# Patient Record
Sex: Male | Born: 1955
Health system: Southern US, Community
[De-identification: ages and names within clinical notes are randomized; demographics above are authoritative.]

## PROBLEM LIST (undated history)

## (undated) DIAGNOSIS — Z720 Tobacco use: Secondary | ICD-10-CM

## (undated) DIAGNOSIS — T7840XA Allergy, unspecified, initial encounter: Secondary | ICD-10-CM

## (undated) DIAGNOSIS — J189 Pneumonia, unspecified organism: Secondary | ICD-10-CM

## (undated) DIAGNOSIS — Z8719 Personal history of other diseases of the digestive system: Secondary | ICD-10-CM

## (undated) DIAGNOSIS — I739 Peripheral vascular disease, unspecified: Secondary | ICD-10-CM

## (undated) DIAGNOSIS — E785 Hyperlipidemia, unspecified: Secondary | ICD-10-CM

## (undated) DIAGNOSIS — I10 Essential (primary) hypertension: Secondary | ICD-10-CM

## (undated) DIAGNOSIS — J302 Other seasonal allergic rhinitis: Secondary | ICD-10-CM

## (undated) DIAGNOSIS — I219 Acute myocardial infarction, unspecified: Secondary | ICD-10-CM

## (undated) DIAGNOSIS — J449 Chronic obstructive pulmonary disease, unspecified: Secondary | ICD-10-CM

## (undated) DIAGNOSIS — I209 Angina pectoris, unspecified: Secondary | ICD-10-CM

## (undated) DIAGNOSIS — J439 Emphysema, unspecified: Secondary | ICD-10-CM

## (undated) DIAGNOSIS — I2510 Atherosclerotic heart disease of native coronary artery without angina pectoris: Secondary | ICD-10-CM

## (undated) DIAGNOSIS — R0600 Dyspnea, unspecified: Secondary | ICD-10-CM

## (undated) DIAGNOSIS — R011 Cardiac murmur, unspecified: Secondary | ICD-10-CM

## (undated) DIAGNOSIS — C801 Malignant (primary) neoplasm, unspecified: Secondary | ICD-10-CM

## (undated) DIAGNOSIS — K219 Gastro-esophageal reflux disease without esophagitis: Secondary | ICD-10-CM

## (undated) DIAGNOSIS — Z5189 Encounter for other specified aftercare: Secondary | ICD-10-CM

## (undated) DIAGNOSIS — D649 Anemia, unspecified: Secondary | ICD-10-CM

## (undated) DIAGNOSIS — K649 Unspecified hemorrhoids: Secondary | ICD-10-CM

## (undated) HISTORY — DX: Anemia, unspecified: D64.9

## (undated) HISTORY — PX: ANKLE SURGERY: SHX546

## (undated) HISTORY — DX: Hyperlipidemia, unspecified: E78.5

## (undated) HISTORY — PX: ANTERIOR CRUCIATE LIGAMENT REPAIR: SHX115

## (undated) HISTORY — PX: INGUINAL HERNIA REPAIR: SUR1180

## (undated) HISTORY — DX: Allergy, unspecified, initial encounter: T78.40XA

## (undated) HISTORY — PX: CORONARY ANGIOPLASTY WITH STENT PLACEMENT: SHX49

## (undated) HISTORY — PX: COLONOSCOPY: SHX174

## (undated) HISTORY — PX: UPPER GASTROINTESTINAL ENDOSCOPY: SHX188

## (undated) HISTORY — DX: Emphysema, unspecified: J43.9

## (undated) HISTORY — DX: Gastro-esophageal reflux disease without esophagitis: K21.9

## (undated) HISTORY — DX: Tobacco use: Z72.0

## (undated) HISTORY — PX: MANDIBLE SURGERY: SHX707

## (undated) HISTORY — DX: Peripheral vascular disease, unspecified: I73.9

## (undated) HISTORY — DX: Acute myocardial infarction, unspecified: I21.9

## (undated) HISTORY — PX: KNEE ARTHROSCOPY: SHX127

## (undated) HISTORY — PX: SHOULDER ARTHROSCOPY WITH ROTATOR CUFF REPAIR: SHX5685

## (undated) HISTORY — DX: Essential (primary) hypertension: I10

## (undated) HISTORY — DX: Encounter for other specified aftercare: Z51.89

## (undated) HISTORY — DX: Unspecified hemorrhoids: K64.9

## (undated) HISTORY — PX: FEMORAL ARTERY STENT: SHX1583

## (undated) HISTORY — PX: PERCUTANEOUS STENT INTERVENTION: SHX6019

## (undated) HISTORY — DX: Atherosclerotic heart disease of native coronary artery without angina pectoris: I25.10

## (undated) HISTORY — DX: Personal history of other diseases of the digestive system: Z87.19

---

## 1998-12-28 ENCOUNTER — Ambulatory Visit (HOSPITAL_COMMUNITY): Admission: RE | Admit: 1998-12-28 | Discharge: 1998-12-28 | Payer: Self-pay | Admitting: Orthopedic Surgery

## 1998-12-28 ENCOUNTER — Encounter: Payer: Self-pay | Admitting: Orthopedic Surgery

## 1999-06-25 ENCOUNTER — Ambulatory Visit (HOSPITAL_BASED_OUTPATIENT_CLINIC_OR_DEPARTMENT_OTHER): Admission: RE | Admit: 1999-06-25 | Discharge: 1999-06-25 | Payer: Self-pay | Admitting: Orthopedic Surgery

## 2002-04-30 ENCOUNTER — Ambulatory Visit (HOSPITAL_COMMUNITY): Admission: RE | Admit: 2002-04-30 | Discharge: 2002-04-30 | Payer: Self-pay | Admitting: Internal Medicine

## 2002-04-30 ENCOUNTER — Encounter: Payer: Self-pay | Admitting: Internal Medicine

## 2004-10-18 ENCOUNTER — Ambulatory Visit: Payer: Self-pay | Admitting: Internal Medicine

## 2004-12-07 ENCOUNTER — Ambulatory Visit: Payer: Self-pay | Admitting: Internal Medicine

## 2005-04-09 ENCOUNTER — Ambulatory Visit: Payer: Self-pay | Admitting: Internal Medicine

## 2005-05-02 ENCOUNTER — Ambulatory Visit: Payer: Self-pay | Admitting: Internal Medicine

## 2005-05-23 ENCOUNTER — Ambulatory Visit: Payer: Self-pay | Admitting: Internal Medicine

## 2005-12-26 ENCOUNTER — Ambulatory Visit: Payer: Self-pay | Admitting: Internal Medicine

## 2006-02-24 ENCOUNTER — Inpatient Hospital Stay (HOSPITAL_COMMUNITY): Admission: AD | Admit: 2006-02-24 | Discharge: 2006-02-27 | Payer: Self-pay | Admitting: Internal Medicine

## 2006-02-24 ENCOUNTER — Ambulatory Visit: Payer: Self-pay | Admitting: Internal Medicine

## 2006-02-25 ENCOUNTER — Ambulatory Visit: Payer: Self-pay | Admitting: Internal Medicine

## 2006-02-26 HISTORY — PX: CARDIAC CATHETERIZATION: SHX172

## 2006-04-04 ENCOUNTER — Ambulatory Visit (HOSPITAL_COMMUNITY): Admission: RE | Admit: 2006-04-04 | Discharge: 2006-04-05 | Payer: Self-pay | Admitting: Cardiology

## 2006-04-08 ENCOUNTER — Ambulatory Visit (HOSPITAL_COMMUNITY): Admission: RE | Admit: 2006-04-08 | Discharge: 2006-04-08 | Payer: Self-pay | Admitting: Cardiology

## 2006-11-13 ENCOUNTER — Encounter: Admission: RE | Admit: 2006-11-13 | Discharge: 2006-11-13 | Payer: Self-pay | Admitting: Cardiovascular Disease

## 2006-11-18 ENCOUNTER — Inpatient Hospital Stay (HOSPITAL_COMMUNITY): Admission: AD | Admit: 2006-11-18 | Discharge: 2006-11-19 | Payer: Self-pay | Admitting: Cardiovascular Disease

## 2006-11-18 HISTORY — PX: CARDIAC CATHETERIZATION: SHX172

## 2007-06-22 ENCOUNTER — Encounter: Payer: Self-pay | Admitting: Internal Medicine

## 2007-08-05 ENCOUNTER — Telehealth (INDEPENDENT_AMBULATORY_CARE_PROVIDER_SITE_OTHER): Payer: Self-pay | Admitting: *Deleted

## 2007-10-28 ENCOUNTER — Ambulatory Visit (HOSPITAL_COMMUNITY): Admission: RE | Admit: 2007-10-28 | Discharge: 2007-10-28 | Payer: Self-pay | Admitting: Cardiovascular Disease

## 2007-10-28 HISTORY — PX: CARDIAC CATHETERIZATION: SHX172

## 2007-11-13 ENCOUNTER — Encounter: Payer: Self-pay | Admitting: Internal Medicine

## 2008-02-18 ENCOUNTER — Encounter: Payer: Self-pay | Admitting: Internal Medicine

## 2008-03-03 ENCOUNTER — Encounter: Payer: Self-pay | Admitting: Internal Medicine

## 2008-08-14 ENCOUNTER — Emergency Department (HOSPITAL_COMMUNITY): Admission: EM | Admit: 2008-08-14 | Discharge: 2008-08-15 | Payer: Self-pay | Admitting: Emergency Medicine

## 2008-09-08 ENCOUNTER — Encounter: Payer: Self-pay | Admitting: Internal Medicine

## 2008-09-21 ENCOUNTER — Encounter: Payer: Self-pay | Admitting: Internal Medicine

## 2008-09-21 ENCOUNTER — Encounter: Admission: RE | Admit: 2008-09-21 | Discharge: 2008-09-21 | Payer: Self-pay | Admitting: Cardiovascular Disease

## 2008-09-23 ENCOUNTER — Ambulatory Visit (HOSPITAL_COMMUNITY): Admission: RE | Admit: 2008-09-23 | Discharge: 2008-09-23 | Payer: Self-pay | Admitting: Cardiovascular Disease

## 2008-09-23 HISTORY — PX: CARDIAC CATHETERIZATION: SHX172

## 2008-09-29 ENCOUNTER — Encounter: Payer: Self-pay | Admitting: Internal Medicine

## 2009-03-21 ENCOUNTER — Ambulatory Visit: Payer: Self-pay | Admitting: Internal Medicine

## 2009-03-22 ENCOUNTER — Encounter (INDEPENDENT_AMBULATORY_CARE_PROVIDER_SITE_OTHER): Payer: Self-pay | Admitting: *Deleted

## 2009-05-31 ENCOUNTER — Encounter: Payer: Self-pay | Admitting: Internal Medicine

## 2010-01-05 ENCOUNTER — Encounter: Payer: Self-pay | Admitting: Internal Medicine

## 2010-07-20 ENCOUNTER — Ambulatory Visit: Payer: Self-pay | Admitting: Internal Medicine

## 2010-07-20 DIAGNOSIS — K029 Dental caries, unspecified: Secondary | ICD-10-CM | POA: Insufficient documentation

## 2010-08-30 NOTE — Letter (Signed)
Summary: [facility]  [facility]   Imported By: Lanelle Bal 01/13/2010 11:43:24  _____________________________________________________________________  External Attachment:    Type:   Image     Comment:   External Document

## 2010-08-30 NOTE — Assessment & Plan Note (Signed)
Summary: abscessed tooth requests pain med and abx/kb   Vital Signs:  Patient profile:   55 year old male Height:      68 inches Weight:      138.8 pounds BMI:     21.18 Temp:     98.4 degrees F oral Pulse rate:   64 / minute Resp:     15 per minute BP sitting:   120 / 76  (left arm) Cuff size:   regular  Vitals Entered By: Shonna Chock CMA (July 20, 2010 10:26 AM) CC: Tooth concerns, Neck pain   CC:  Tooth concerns and Neck pain.  History of Present Illness:    He has had a flare of a dental abscess over past 6 days with L jaw & neck pain. This has recurred  intermittently over  several months. He is on Plavix precluding drainage or  resection .Last Oral Surgery assessment  & dental Xrays were  in 2007 by Dr Marliss Coots. his Dentist does not perform  such extractions.  The pain now  is described as dull- aching in neck. It is a "severe, 9 on 10 scale" pain in the jaw; it required he leave work the past 2 days..  The pain is better with NSAIDs.    Current Medications (verified): 1)  Spironolactone 25 Mg  Tabs (Spironolactone) .Marland Kitchen.. 1 By Mouth Two Times A Day 2)  Advicor 500-20 Mg Xr24h-Tab (Niacin-Lovastatin) .Marland Kitchen.. 1 By Mouth Once Daily 3)  Diovan 160 Mg Tabs (Valsartan) .Marland Kitchen.. 1 By Mouth Once Daily 4)  Plavix 75 Mg Tabs (Clopidogrel Bisulfate) .Marland Kitchen.. 1 By Mouth Once Daily 5)  Toprol Xl 100 Mg Xr24h-Tab (Metoprolol Succinate) .Marland Kitchen.. 1 By Mouth Once Daily 6)  Bayer Low Strength 81 Mg Tbec (Aspirin) .Marland Kitchen.. 1 By Mouth Once Daily 7)  Nexium 40 Mg Pack (Esomeprazole Magnesium) .Marland Kitchen.. 1 By Mouth Once Daily 8)  Norvasc 5 Mg Tabs (Amlodipine Besylate) .Marland Kitchen.. 1 By Mouth Once Daily 9)  Claritin 10 Mg Tabs (Loratadine) .Marland Kitchen.. 1 By Mouth Once Daily  Allergies: 1)  ! Tylox  Review of Systems General:  Denies chills, fever, and sweats.  Physical Exam  General:  in no acute distress; alert,appropriate and cooperative throughout examination Ears:  External ear exam shows no significant lesions or  deformities.  Otoscopic examination reveals  wax bilaterally Nose:  External nasal examination shows no deformity or inflammation. Nasal mucosa are pink and moist without lesions or exudates. Mouth:  Oral mucosa and oropharynx without lesions or exudates.  Teeth in  horrible state of carious decay, especially molars Heart:  Normal rate and regular rhythm. S1 and S2 normal without gallop, murmur,  rub . Loud S4 vs click @ apex Cervical Nodes:  No lymphadenopathy noted Axillary Nodes:  No palpable lymphadenopathy   Impression & Recommendations:  Problem # 1:  DENTAL CARIES (ICD-521.00) SEVERE ; very high risk of Endocarditis DISCUSSED. On chronic Plavix therapy.  Complete Medication List: 1)  Spironolactone 25 Mg Tabs (Spironolactone) .Marland Kitchen.. 1 by mouth two times a day 2)  Advicor 500-20 Mg Xr24h-tab (Niacin-lovastatin) .Marland Kitchen.. 1 by mouth once daily 3)  Diovan 160 Mg Tabs (Valsartan) .Marland Kitchen.. 1 by mouth once daily 4)  Plavix 75 Mg Tabs (Clopidogrel bisulfate) .Marland Kitchen.. 1 by mouth once daily 5)  Toprol Xl 100 Mg Xr24h-tab (Metoprolol succinate) .Marland Kitchen.. 1 by mouth once daily 6)  Bayer Low Strength 81 Mg Tbec (Aspirin) .Marland Kitchen.. 1 by mouth once daily 7)  Nexium 40 Mg Pack (Esomeprazole magnesium) .Marland KitchenMarland KitchenMarland Kitchen  1 by mouth once daily 8)  Norvasc 5 Mg Tabs (Amlodipine besylate) .Marland Kitchen.. 1 by mouth once daily 9)  Claritin 10 Mg Tabs (Loratadine) .Marland Kitchen.. 1 by mouth once daily 10)  Amoxicillin-pot Clavulanate 1000-62.5 Mg Xr12h-tab (Amoxicillin-pot clavulanate) .Marland Kitchen.. 1 every 12 hrs with a meal 11)  Tramadol Hcl 50 Mg Tabs (Tramadol hcl) .Marland Kitchen.. 1 every 6 hrs as needed for pain  Patient Instructions: 1)  Discuss addressing the very high risk of the very severe , advanced  diffuse caries with Dr Allyson Sabal &  an Oral Surgeon. The principle is antibiotics PLUS I&D for optimal response. Prescriptions: TRAMADOL HCL 50 MG TABS (TRAMADOL HCL) 1 every 6 hrs as needed for pain  #30 x 0   Entered and Authorized by:   Marga Melnick MD   Signed by:    Marga Melnick MD on 07/20/2010   Method used:   Print then Give to Patient   RxID:   [id number] AMOXICILLIN-POT CLAVULANATE 1000-62.5 MG XR12H-TAB (AMOXICILLIN-POT CLAVULANATE) 1 every 12 hrs with a meal  #14 x 0   Entered and Authorized by:   Marga Melnick MD   Signed by:   Marga Melnick MD on 07/20/2010   Method used:   Print then Give to Patient   RxID:   [id number]    Orders Added: 1)  Est. Patient Level III [32440]

## 2010-12-11 NOTE — Cardiovascular Report (Signed)
NAMECAI, ANFINSON NO.:  [id number]   MEDICAL RECORD NO.:  [id number]          PATIENT TYPE:  OIB   LOCATION:  2855                         FACILITY:  [facility]   PHYSICIAN:  Nanetta Batty, M.D.   DATE OF BIRTH:  [birth year]   DATE OF PROCEDURE:  10/28/2007  DATE OF DISCHARGE:                            CARDIAC CATHETERIZATION   Sean Rose is a 55 year old thin-appearing Caucasian male with history  of CAD status post stenting of his dominant right by Dr. Clarene Duke February 26, 2006 with multiple overlapping Liberte bare-metal stents.  He had an  anomalous circumflex and otherwise no significant CAD with normal LV  function.  Does have PVOD status post right common iliac artery PTA and  stenting by Dr. Jacinto Halim approximately a month later.  His other problems  include continued tobacco abuse, treated hypertension, treated  dyslipidemia.  He has been experiencing increasing chest pain similar to  his previous PCI symptoms over the last several weeks.  These are  associated with some shortness of breath and nausea.  He presents now  for outpatient diagnostic coronary angiography to define his anatomy and  rule out ischemic etiology.  His last catheterization performed by  myself November 18, 2006 was remarkable for diffuse in-stent restenosis  treated with cutting balloon atherectomy.   DESCRIPTION OF PROCEDURE:  The patient was brought to the second floor  [facility] cardiac catheterization lab in the postabsorptive state.  He  was premedicated with p.o. Valium.   His right groin was prepped and draped in the usual sterile fashion.  One percent Xylocaine was used for local anesthesia.  A 6-French sheath  was inserted into the right femoral artery using standard Seldinger  technique.  A 6-French right and left Judkins diagnostic catheter as  well as a 6-French pigtail catheter and no torque catheter were used for  selective coronary angiography, left ventriculography, and  distal  abdominal aortography.  Visipaque dye was used for the entirety of the  case.  __________ aorta , ventricular and pull-back pressures were  recorded.   HEMODYNAMIC RESULTS:  Aortic systolic pressure 114, diastolic pressure  67.   Ventricle systolic pressure 160, end-diastolic pressure 9.   SELECTIVE CORONARY ANGIOGRAPHY:  1. Left main; given the fact that there was an anomalous circumflex,      there was no left main.  2. LAD; widely patent.  The LAD gave off several small to medium sized      diagonal branches.  There was a 30% stenosis in the midportion of      the LAD.  3. The left circumflex; small nondominant and an anomalous coming off      the ostium of the RCA.  There is a 40% proximal stenosis.  4. Right coronary; large dominant vessel stented from the first bend      to the genu of the vessel.  The stented segment was widely patent.      There is a 30% proximal stenosis on the first bend prior to the      beginning of the stent.  There is  a 50% smooth tubular stenosis      just distal to the end of the stent which has not changed since the      previous angiogram.  5. Left ventriculography; RAO left ventriculogram was performed using      25 mL of Visipaque dye at 12 mL per second.  The overall LVEF was      estimated at greater than 60% without focal wall motion      abnormalities.  6. Distal abdominal aortography; distal abdominal aortogram was      performed using 20 mL of Visipaque dye at 20 mL per second.  The      infrarenal abdominal aorta was free of significant atherosclerotic      changes.  The right common iliac artery stent was widely patent.      There was approximately 50% ostial left common iliac artery      stenosis.   IMPRESSION:  Sean Rose' anatomy is unchanged.  The atherectomized  portions performed one year ago are widely patent.  It is certainly  possible that the distal RCA past the stented segment may be  physiologically significant,  but it only appears to be about 50% smooth  and tubular.  We will continue to treat him medically.   Sheath was removed, and pressure was held on the groin to achieve  hemostasis.  The patient left the lab in stable condition.  He will be  discharged home later today as an outpatient and will him back in the  office in one to two weeks for follow-up.      Nanetta Batty, M.D.  Electronically Signed     JB/MEDQ  D:  10/28/2007  T:  10/28/2007  Job:  [id number]   cc:   [facility]  Titus Dubin. Alwyn Ren, MD,FACP,FCCP

## 2010-12-11 NOTE — Cardiovascular Report (Signed)
Sean, Rose NO.:  [id number]   MEDICAL RECORD NO.:  [id number]          PATIENT TYPE:  OIB   LOCATION:  2899                         FACILITY:  [facility]   PHYSICIAN:  Nanetta Batty, M.D.   DATE OF BIRTH:  Apr 27, 1956   DATE OF PROCEDURE:  DATE OF DISCHARGE:  09/23/2008                            CARDIAC CATHETERIZATION   Sean Rose is a 55 year old thin-appearing married white male father of two  who had RCA stenting on February 02, 2008 with overlapping Liberte bare-metal  stent by Dr. Clarene Duke.  He had significant right iliac disease which was  stented by Dr. Jacinto Halim who had cathed him November 04, 2007 revealing  significant in-stent restenosis of his RCA and I performed cutting  balloon atherectomy.  He did have an anomalous circumflex which was  small and had a 40% proximal lesion.  He had re-cathed him a year later  revealing a 50% distal RCA stenosis as well as a 50% left common iliac  artery stenosis.  He had negative nuclear study in July 2009 but is  still complaining of daily chest pain.  Because of this, he presents now  for elective outpatient diagnostic coronary arteriography to define his  anatomy and rule out ischemic etiology.   DESCRIPTION OF PROCEDURE:  The patient was brought to the Second Floor  [facility] in the postabsorptive state.  He was  premedicated with p.o. Valium.  His right groin was prepped and shaved  in the usual sterile fashion.  Xylocaine 1% was used for local  anesthesia.  A 6-French sheath was inserted into the right femoral  artery using standard Seldinger technique.  A 6-French left and right  Judkins diagnostic catheter as well as a 6-French pigtail catheter and 5-  [place] Mentor catheter were used for selective coronary angiography, and  left ventriculography respectively.  Visipaque dye was used for the  entirety of the case.  Retrograde aortic, left ventricular and pullback  pressures were recorded.   HEMODYNAMICS:  1. Aortic systolic pressure 118, diastolic pressure 70.  2. Left ventricular systolic pressure 120, end-diastolic pressure 9.   SELECTIVE CORONARY ANGIOGRAPHY:  1. Left main normal.  2. LAD; LAD was a small to moderate size vessel that had mostly 30%      stenosis in the midportion.  3. Right coronary artery; is a large dominant vessel with long stented      segment from proximal down to the genu.  There was at most 30% in-      stent restenosis with this portion of the stent.  There was fairly      a focal 50% stenosis just beyond the stent which had not changed      from his prior cath.  4. Circumflex; anomalous and arising from the origin of the right      coronary artery with at most 30-40% stenosis from the origin in      proximal portion.  5. Left ventriculography; RAO left ventriculogram was performed using      a 25 mL of Visipaque dye at 12 mL per  second.  The overall LVEF was      estimated at greater than 60% without focal wall motion      abnormalities.   IMPRESSION:  [place] anatomy is unchanged from his last cath.  I am not  convinced of his distal right coronary artery lesion is hemodynamically  significant and certainly do not think it was responsible for his  symptoms.  Continued medical therapy will be recommended.  He may need a  GI evaluation in addition.   Sheath was removed and pressure was held in groin to achieve hemostasis.  The patient left the lab in stable condition.      Nanetta Batty, M.D.  Electronically Signed     JB/MEDQ  D:  09/23/2008  T:  09/23/2008  Job:  [id number]   cc:   Second Floor [facility]  [facility]  Titus Dubin. Alwyn Ren, MD,FACP,FCCP

## 2010-12-14 NOTE — Op Note (Signed)
[facility]  Patient:    Sean Rose                        MRN: [id number] Proc. Date: 06/25/99 Adm. Date:  [id number] Attending:  Twana First                           Operative Report  PREOPERATIVE DIAGNOSIS: 1. Right shoulder partial rotator cuff tear. 2. Right shoulder partial labrum tear. 3. Right shoulder impingement. 4. Right shoulder distal clavicle, degenerative joint disease, acromioclavicular    joint degenerative joint disease and spurring.  POSTOPERATIVE DIAGNOSIS: 1. Right shoulder partial rotator cuff tear. 2. Right shoulder partial labrum tear. 3. Right shoulder impingement. 4. Right shoulder distal clavicle, degenerative joint disease, acromioclavicular    joint degenerative joint disease and spurring.  OPERATION PERFORMED: 1. Right shoulder examination under anesthesia followed by arthroscopic partial  rotator cuff tear debridement, partial labrum tear debridement. 2. Right shoulder arthroscopic subacromial decompression. 3. Right shoulder arthroscopically assisted distal clavicle excision.  SURGEON:  Elana Alm. Thurston Hole, M.D.  ASSISTANT:  Kirstin Adelberger, P.A.  ANESTHESIA:  General.  OPERATIVE TIME:  45 minutes.  COMPLICATIONS:  None.  INDICATIONS FOR PROCEDURE:  Sean Rose is a 55 year old gentleman who has had significant right shoulder pain on and off for the past year increasing in nature with signs and symptoms consistent with a partial rotator cuff tear, impingement and AC joint spurring and has failed conservative care and is now to undergo arthroscopy.  DESCRIPTION OF PROCEDURE:  Sean Rose was brought to the operating room on June 25, 1999 after a supraclavicular block had been placed in the holding room.  He was placed on the operating table in supine position.  After an adequate level of general anesthesia was obtained, his right shoulder was examined under  anesthesia.  He had full  range of motion and the shoulder was stable to ligamentous exam.  After this was done, he was placed in a beach chair position and his shoulder and arm were prepped using sterile Betadine and draped using sterile technique.  Originally, through a posterior arthroscopic portal, the arthroscope with a pump attached was placed and through an anterior portal, an arthroscopic  probe was placed.  On initial inspection, the articular cartilage and the glenohumeral joint was found to be intact.  Anterior labrum showed a partial tear of 25% which was debrided.  The superior labrum and biceps tendon anchor showed  moderate instability, partial tearing and this was amenable to thermal shrinkage stabilization and this was performed with a 90 degree Arthrex wand.  The biceps  tendon itself was intact.  The inferior labrum and anterior inferior glenohumeral ligaments were intact.  Subscapularis was intact.  The posterior labrum showed moderate fraying which was debrided.  The inferior capsular recess was free of pathology.  The rotator cuff showed a partial tear 50% of the undersurface of the supraspinatus which was debrided.  The rest of the rotator cuff was found to be  intact.  The subacromial space was entered and a lateral arthroscopic portal was made.  Moderately thickened bursitis was resected.  Underneath this the rotator  cuff was found to be thickened and edematous but no evidence of a tear. Subacromial decompression was carried out removing 6 to 8 mm of the undersurface of the anterior, anterolateral and anteromedial acromion.  CA ligament release carried out at well.  The distal clavicle AC joint was exposed.  Found to have significant spurring and degenerative changes in this joint.  A 6 mm bur was used to resect the distal 5 to 6 mm of the clavicle and the spurs as well.  Intraoperative fluoroscopy confirmed adequate decompression and excision of the distal clavicle.   After this was done, the shoulder could be brought through a full range of motion with no impingement.  At this point it was felt that all pathology had been satisfactorily addressed.  The instruments were removed.  Portals were closed with 3-0 nylon suture and injected with 0.25% Marcaine with epinephrine.  Sterile dressings applied and a sling and then the patient awakened and taken to the recovery in stable condition.  FOLLOW-UP:  Mr. Sean Rose will be followed as an outpatient on [place] NX for pain.  See him back in the office in a week for suture removal and follow-up.  Begin early physical therapy for passive range of motion only x 3 to 4 weeks and then active thereafter. DD:  06/25/99 TD:  06/25/99 Job: [id number]

## 2011-01-31 HISTORY — PX: OTHER SURGICAL HISTORY: SHX169

## 2011-04-23 LAB — CBC
HCT: 42.6
Platelets: 374
RDW: 13.8

## 2011-04-23 LAB — BASIC METABOLIC PANEL
BUN: 2 — ABNORMAL LOW
Calcium: 9
Chloride: 99
Creatinine, Ser: 0.66

## 2011-08-09 HISTORY — PX: OTHER SURGICAL HISTORY: SHX169

## 2011-08-09 HISTORY — PX: US CAROTID DOPPLER BILATERAL (ARMC HX): HXRAD1402

## 2012-11-11 ENCOUNTER — Other Ambulatory Visit (HOSPITAL_COMMUNITY): Payer: Self-pay | Admitting: Cardiovascular Disease

## 2012-11-11 DIAGNOSIS — I25810 Atherosclerosis of coronary artery bypass graft(s) without angina pectoris: Secondary | ICD-10-CM

## 2012-11-11 DIAGNOSIS — I739 Peripheral vascular disease, unspecified: Secondary | ICD-10-CM

## 2012-11-17 ENCOUNTER — Ambulatory Visit (HOSPITAL_COMMUNITY)
Admission: RE | Admit: 2012-11-17 | Discharge: 2012-11-17 | Disposition: A | Discharge status: Home / Self Care | Payer: BC Managed Care – PPO | Source: Ambulatory Visit | Attending: Cardiovascular Disease | Admitting: Cardiovascular Disease

## 2012-11-17 DIAGNOSIS — I25810 Atherosclerosis of coronary artery bypass graft(s) without angina pectoris: Secondary | ICD-10-CM | POA: Insufficient documentation

## 2012-11-17 DIAGNOSIS — F17200 Nicotine dependence, unspecified, uncomplicated: Secondary | ICD-10-CM | POA: Insufficient documentation

## 2012-11-17 DIAGNOSIS — Z8249 Family history of ischemic heart disease and other diseases of the circulatory system: Secondary | ICD-10-CM | POA: Insufficient documentation

## 2012-11-17 DIAGNOSIS — I10 Essential (primary) hypertension: Secondary | ICD-10-CM | POA: Insufficient documentation

## 2012-11-17 HISTORY — PX: CARDIOVASCULAR STRESS TEST: SHX262

## 2012-11-17 MED ORDER — AMINOPHYLLINE 25 MG/ML IV SOLN
75.0000 mg | Freq: Once | INTRAVENOUS | Status: AC
  Administered 2012-11-17: 75 mg via INTRAVENOUS

## 2012-11-17 MED ORDER — TECHNETIUM TC 99M SESTAMIBI GENERIC - CARDIOLITE
10.5000 | Freq: Once | INTRAVENOUS | Status: AC | PRN
  Administered 2012-11-17: 11 via INTRAVENOUS

## 2012-11-17 MED ORDER — REGADENOSON 0.4 MG/5ML IV SOLN
0.4000 mg | Freq: Once | INTRAVENOUS | Status: AC
  Administered 2012-11-17: 0.4 mg via INTRAVENOUS

## 2012-11-17 MED ORDER — TECHNETIUM TC 99M SESTAMIBI GENERIC - CARDIOLITE
31.6000 | Freq: Once | INTRAVENOUS | Status: AC | PRN
  Administered 2012-11-17: 32 via INTRAVENOUS

## 2012-11-17 NOTE — Procedures (Addendum)
[facility] [address] [place] [address] [place] [address] [phone]  Cardiology Nuclear Med Study  Sean Rose is a 57 y.o. male     MRN : [id number]     DOB: 01-Sep-1955  Procedure Date: 11/17/2012  Nuclear Med Background Indication for Stress Test:  Stent Patency History:  CAD;STENT X3/PTCA--2008 Cardiac Risk Factors: Family History - CAD, Hypertension, Lipids and Smoker  Symptoms:  DOE, Fatigue, Light-Headedness, Palpitations and SOB   Nuclear Pre-Procedure Caffeine/Decaff Intake:  8:00pm NPO After: 6:00am   IV Site: R Antecubital  IV 0.9% NS with Angio Cath:  22g  Chest Size (in):  42" IV Started by: Emmit Pomfret, RN  Height: 5\' 9"  (1.753 m)  Cup Size: n/a  BMI:  Body mass index is 20.67 kg/(m^2). Weight:  140 lb (63.504 kg)   Tech Comments:  N/A    Nuclear Med Study 1 or 2 day study: 1 day  Stress Test Type:  Lexiscan  Order Authorizing Provider:  Nanetta Batty, MD   Resting Radionuclide: Technetium 74m Sestamibi  Resting Radionuclide Dose: 10.5 mCi   Stress Radionuclide:  Technetium 82m Sestamibi  Stress Radionuclide Dose: 31.6 mCi           Stress Protocol Rest HR: 63 Stress HR: 79  Rest BP: 129/76 Stress BP: 143/83  Exercise Time (min): n/a METS: n/a          Dose of Adenosine (mg):  n/a Dose of Lexiscan: 0.4 mg  Dose of Atropine (mg): n/a Dose of Dobutamine: n/a mcg/kg/min (at max HR)  Stress Test Technologist: Ernestene Mention, CCT Nuclear Technologist: Gonzella Lex, CNMT   Rest Procedure:  Myocardial perfusion imaging was performed at rest 45 minutes following the intravenous administration of Technetium 7m Sestamibi. Stress Procedure:  The patient received IV Lexiscan 0.4 mg over 15-seconds.  Technetium 66m Sestamibi injected at 30-seconds.  Due to patient's shortness of breath, light-headedness and fatigue, he was given IV Aminophylline 75 mg. Symptoms were resolved during recovery. There were no  significant changes with Lexiscan.  Quantitative spect images were obtained after a 45 minute delay.  Transient Ischemic Dilatation (Normal <1.22):  1.09 Lung/Heart Ratio (Normal <0.45):  0.26 QGS EDV:  99 ml QGS ESV:  40 ml LV Ejection Fraction: 60%  Signed by Gonzella Lex, CNMT  PHYSICIAN INTERPRETATION:  Rest ECG: NSR with non-specific ST-T wave changes  Stress ECG: No significant change from baseline ECG  QPS Raw Data Images:  There is a large loop of bowel & left hepatic lobe with significant tracer intensity that obscures the entier inferior LV border, and reduces the accuracy of interpretation. Stress Images:  There is decreased uptake in the septum.  There is a medium sized, moderate to intensity, mostly fixed perfusion defect in the basal to mid septal wall extending inferiorly towards the apex.  There is also decreased inferior wall uptake that can be attributed by subdiaphragmatic attenuation. Rest Images:  There is decreased uptake in the septum.   Subtraction (SDS):  There is a fixed defect that is most consistent with a previous infarction involving the basal (and likely infero-basal) septum.  However, the absence of notable wall motion abnormalities in this region along with only mildly reduced thickening would suggest potential artifact/ attenuation related defect. There is no notable reversibility to suggest ischemia.   Impression Exercise Capacity:  Lexiscan with no exercise. BP Response:  Normal blood pressure response. Clinical Symptoms:  There is dyspnea. ECG Impression:  No  significant ECG changes with Lexiscan. Comparison with Prior Nuclear Study: The septal perfusion defect is new when compared to the study from 2010.    LV Wall Motion:  Normal LV Function with a "focal" mid inferior wall perfusion imaging abnormality with hypokinesis is noted.  Overall Impression:  Abnormal Myocardial Perfusion Imaging with a basal to mid perfusion defect suggestive of previous  MI. Low risk stress nuclear study in the absence of "ischmia" findings.  Clinical Correlation is warranted.    Marykay Lex, MD  11/17/2012 1:35 PM

## 2012-11-26 ENCOUNTER — Ambulatory Visit (HOSPITAL_COMMUNITY)
Admission: RE | Admit: 2012-11-26 | Discharge: 2012-11-26 | Disposition: A | Discharge status: Home / Self Care | Payer: BC Managed Care – PPO | Source: Ambulatory Visit | Attending: Cardiovascular Disease | Admitting: Cardiovascular Disease

## 2012-11-26 DIAGNOSIS — I2510 Atherosclerotic heart disease of native coronary artery without angina pectoris: Secondary | ICD-10-CM | POA: Insufficient documentation

## 2012-11-26 DIAGNOSIS — I739 Peripheral vascular disease, unspecified: Secondary | ICD-10-CM | POA: Insufficient documentation

## 2012-11-26 DIAGNOSIS — I25810 Atherosclerosis of coronary artery bypass graft(s) without angina pectoris: Secondary | ICD-10-CM

## 2012-11-26 HISTORY — PX: TRANSTHORACIC ECHOCARDIOGRAM: SHX275

## 2012-11-26 NOTE — Progress Notes (Signed)
[facility]   2D echo completed 11/26/2012.   Cindy Caspar Favila, RDCS  

## 2012-11-26 NOTE — Progress Notes (Signed)
Arterial Duplex Lower Ext. Completed. Sean Rose D  

## 2012-12-08 ENCOUNTER — Telehealth: Payer: Self-pay | Admitting: Cardiovascular Disease

## 2012-12-08 NOTE — Telephone Encounter (Signed)
Returning [place] call about his test results .Marland [phone]

## 2012-12-09 ENCOUNTER — Telehealth: Payer: Self-pay | Admitting: Cardiovascular Disease

## 2012-12-09 NOTE — Telephone Encounter (Signed)
i spoke with patient and gave echo results

## 2012-12-09 NOTE — Telephone Encounter (Signed)
lmom 

## 2012-12-09 NOTE — Telephone Encounter (Signed)
I spoke with patient and gave him his echocardiogram results...normal

## 2012-12-09 NOTE — Telephone Encounter (Signed)
Returning your call. °

## 2012-12-18 ENCOUNTER — Other Ambulatory Visit: Payer: Self-pay | Admitting: Cardiovascular Disease

## 2012-12-18 LAB — CBC
MCH: 34.2 pg — ABNORMAL HIGH (ref 26.0–34.0)
MCV: 95.8 fL (ref 78.0–100.0)
Platelets: 328 10*3/uL (ref 150–400)
RDW: 13.5 % (ref 11.5–15.5)
WBC: 14.7 10*3/uL — ABNORMAL HIGH (ref 4.0–10.5)

## 2012-12-18 LAB — HEPATIC FUNCTION PANEL
AST: 22 U/L (ref 0–37)
Albumin: 4.1 g/dL (ref 3.5–5.2)
Alkaline Phosphatase: 56 U/L (ref 39–117)
Total Protein: 7.1 g/dL (ref 6.0–8.3)

## 2012-12-18 LAB — BASIC METABOLIC PANEL
Calcium: 9.6 mg/dL (ref 8.4–10.5)
Chloride: 95 mEq/L — ABNORMAL LOW (ref 96–112)
Creat: 0.68 mg/dL (ref 0.50–1.35)

## 2012-12-18 LAB — LIPID PANEL
HDL: 43 mg/dL (ref >39)
Triglycerides: 224 mg/dL — ABNORMAL HIGH (ref <150)

## 2012-12-22 ENCOUNTER — Telehealth: Payer: Self-pay | Admitting: Cardiovascular Disease

## 2012-12-22 ENCOUNTER — Telehealth: Payer: Self-pay | Admitting: Pediatrics

## 2012-12-22 NOTE — Telephone Encounter (Signed)
Returned call.  Pt stated he had blood work done Friday. Stated he noticed a "goose egg" on his arm after he left.  Stated the bruising goes from elbow to mid-forearm.  C/o some swelling and soreness at site.  Denied warmness to site.  Advised pt continue to monitor area for the next 24hrs, apply cool compress 15-53mins a few times/day prn.  If swelling, pain, numbness, tingling below site call back ASAP.  Pt informed it is not abnormal to have bruising after bloodwork, especially w/ Plavix.  Pt verbalized understanding and agreed w/ plan.

## 2012-12-22 NOTE — Telephone Encounter (Signed)
Had lab work Brock Bad is bruised-very concerned!

## 2012-12-23 ENCOUNTER — Other Ambulatory Visit (INDEPENDENT_AMBULATORY_CARE_PROVIDER_SITE_OTHER): Payer: BC Managed Care – PPO

## 2012-12-23 ENCOUNTER — Telehealth: Payer: Self-pay | Admitting: *Deleted

## 2012-12-23 DIAGNOSIS — D72829 Elevated white blood cell count, unspecified: Secondary | ICD-10-CM

## 2012-12-23 NOTE — Telephone Encounter (Signed)
12/23/2012 12:55 PM Pecola Lawless, MD      Comment: To adequately assess this; the CBC would need to be repeated WITH differential. After that is completed he should make an appointment to discuss the relevance of any elevation of the white count. Dx: elevated WBC      Discuss with patient, appt scheduled labs ordered.

## 2012-12-24 ENCOUNTER — Ambulatory Visit (INDEPENDENT_AMBULATORY_CARE_PROVIDER_SITE_OTHER): Payer: BC Managed Care – PPO | Admitting: Internal Medicine

## 2012-12-24 ENCOUNTER — Encounter: Payer: Self-pay | Admitting: Internal Medicine

## 2012-12-24 VITALS — BP 124/82 | HR 60 | Wt 140.2 lb

## 2012-12-24 DIAGNOSIS — D72829 Elevated white blood cell count, unspecified: Secondary | ICD-10-CM

## 2012-12-24 DIAGNOSIS — F17200 Nicotine dependence, unspecified, uncomplicated: Secondary | ICD-10-CM

## 2012-12-24 DIAGNOSIS — R5383 Other fatigue: Secondary | ICD-10-CM

## 2012-12-24 DIAGNOSIS — R5381 Other malaise: Secondary | ICD-10-CM

## 2012-12-24 DIAGNOSIS — R61 Generalized hyperhidrosis: Secondary | ICD-10-CM

## 2012-12-24 LAB — CBC WITH DIFFERENTIAL/PLATELET
Basophils Relative: 0.3 % (ref 0.0–3.0)
Eosinophils Absolute: 0.3 10*3/uL (ref 0.0–0.7)
MCHC: 35.3 g/dL (ref 30.0–36.0)
MCV: 99.1 fl (ref 78.0–100.0)
Monocytes Absolute: 0.8 10*3/uL (ref 0.1–1.0)
Neutrophils Relative %: 47.7 % (ref 43.0–77.0)
Platelets: 337 10*3/uL (ref 150.0–400.0)

## 2012-12-24 NOTE — Progress Notes (Signed)
  Subjective:    Patient ID: Sean Rose, male    DOB: Oct 15, 1955, 57 y.o.   MRN: [id number]  HPI  He describes fatigue for approximately 7 years, ever since he had his first catheterization and stents was placed on multiple cardiac medicines.  His cardiologist performed an extensive labs; CBC and differential revealed a white blood count of 14,700 which was of concern to him.  Repeat CBC and differential reveals a normal white count and differential. His red cell count was minimally reduced at 4,210,000 /uL  His mother had colon cancer. He's had 2 colonoscopies; he may have had a small polyp initially.    Review of Systems   He has had some night sweats. He denies fever, chills, abdominal pain, unexplained weight loss, melena, or rectal bleeding.  He denies significant cough or sputum production. He's had no hemoptysis.     Objective:   Physical Exam  General appearance:thin but adequately nourished; no acute distress or increased work of breathing is present.  No  lymphadenopathy about the head, neck, or axilla noted.   Eyes: No conjunctival inflammation or lid edema is present. There is no scleral icterus.  Ears:  External ear exam shows no significant lesions or deformities.  Otoscopic examination reveals wax bilaterally  Nose:  External nasal examination shows no deformity or inflammation. Nasal mucosa are slightly boggy on L without lesions or exudates. No septal dislocation or deviation.No obstruction to airflow.   Oral exam: Dental hygiene is good; lips and gums are healthy appearing.There is no oropharyngeal erythema or exudate noted. Lower partial  Neck:  No deformities, thyromegaly, masses, or tenderness noted.   Supple with full range of motion without pain.   Heart:  Normal rate and regular rhythm. Split S1 ; S2 normal without gallop, murmur, click, rub or other extra sounds.   Lungs:Chest clear to auscultation; no wheezes, rhonchi,rales ,or rubs present.No  increased work of breathing.  Decreased BS; barrel chested  Extremities:  No cyanosis or  Edema. Slight  clubbing  noted    Skin: Warm & dry w/o jaundice or tenting.         Assessment & Plan:  #1 leukocytosis, resolved  #2 fatigue; no explanation lab test. This may represent an effect of his polypharmacy, especially the beta blocker  #3 night sweats  #4 smoker  Plan: CXR indicated

## 2012-12-24 NOTE — Patient Instructions (Addendum)
Order for x-rays entered into  the computer; these will be performed at [address] across from [facility]. No appointment is necessary. Please verify when follow up colonoscopy is due based on your records.As per the Standard of Care , screening Colonoscopy recommended @ 50 & every 5-10 years thereafter . More frequent monitor would be dictated by family history or findings @ Colonoscopy.  If you activate the  My Chart system; lab & Xray results will be released directly  to you as soon as I review & address these through the computer. If you choose not to sign up for My Chart within 36 hours of labs being drawn; results will be reviewed & interpretation added before being copied & mailed, causing a delay in getting the results to you.If you do not receive that report within 7-10 days ,please call. Additionally you can use this system to gain direct  access to your records  if  out of town or @ an office of a  physician who is not in  the My Chart network.  This improves continuity of care & places you in control of your medical record.

## 2013-01-20 ENCOUNTER — Encounter: Payer: Self-pay | Admitting: Internal Medicine

## 2013-01-20 ENCOUNTER — Encounter: Payer: Self-pay | Admitting: Physician Assistant

## 2013-01-20 ENCOUNTER — Ambulatory Visit (INDEPENDENT_AMBULATORY_CARE_PROVIDER_SITE_OTHER): Payer: BC Managed Care – PPO | Admitting: Physician Assistant

## 2013-01-20 VITALS — BP 122/68 | HR 72 | Ht 60.25 in | Wt 141.2 lb

## 2013-01-20 DIAGNOSIS — I2510 Atherosclerotic heart disease of native coronary artery without angina pectoris: Secondary | ICD-10-CM

## 2013-01-20 DIAGNOSIS — Z1211 Encounter for screening for malignant neoplasm of colon: Secondary | ICD-10-CM

## 2013-01-20 DIAGNOSIS — K222 Esophageal obstruction: Secondary | ICD-10-CM | POA: Insufficient documentation

## 2013-01-20 DIAGNOSIS — I739 Peripheral vascular disease, unspecified: Secondary | ICD-10-CM

## 2013-01-20 DIAGNOSIS — Z86010 Personal history of colonic polyps: Secondary | ICD-10-CM

## 2013-01-20 DIAGNOSIS — Z800 Family history of malignant neoplasm of digestive organs: Secondary | ICD-10-CM

## 2013-01-20 DIAGNOSIS — K219 Gastro-esophageal reflux disease without esophagitis: Secondary | ICD-10-CM | POA: Insufficient documentation

## 2013-01-20 DIAGNOSIS — Z7901 Long term (current) use of anticoagulants: Secondary | ICD-10-CM

## 2013-01-20 DIAGNOSIS — D689 Coagulation defect, unspecified: Secondary | ICD-10-CM

## 2013-01-20 MED ORDER — MOVIPREP 100 G PO SOLR: 1.0000 | Freq: Once | ORAL | Status: DC

## 2013-01-20 NOTE — Patient Instructions (Addendum)
You have been scheduled for a colonoscopy with propofol. Please follow written instructions given to you at your visit today.  Please pick up your prep kit at the pharmacy within the next 1-3 days. If you use inhalers (even only as needed), please bring them with you on the day of your procedure. Your physician has requested that you go to www.startemmi.com and enter the access code given to you at your visit today. This web site gives a general overview about your procedure. However, you should still follow specific instructions given to you by our office regarding your preparation for the procedure.  We sent a prescription for the colonoscopy prep to [facility], [place].

## 2013-01-20 NOTE — Progress Notes (Signed)
Agree with Ms. Esterwood's assessment and plan. Nitza Schmid E. Damyia Strider, MD, FACG   

## 2013-01-20 NOTE — Progress Notes (Signed)
Subjective:    Patient ID: Sean Rose, male    DOB: 12/26/1955, 57 y.o.   MRN: [id number]  HPI  Sean Rose is a 57 year old white male known to Dr. Leone Payor from prior colonoscopy and EGD both done in 2003. Patient comes in today to discuss followup colonoscopy. He is maintained on chronic Plavix and aspirin to 2 history of coronary artery disease and peripheral vascular disease. He is managed by Dr. Nanetta Batty and had several stents the last of which was placed for 5 years ago. Patient has family history of colon cancer in his mother diagnosed in her 67s. Patient currently has no active GI symptoms he says he has had long-term issues with intermittent diarrhea and very occasionally sees a small amount of bright red blood on the tissue which she says has occurred for many years. He does have chronic GERD, but has no complaints of heartburn indigestion or dysphagia currently. Colonoscopy was done in November of 2003 with finding of internal and hemorrhoids and 2 polyps the larger  was 7 mm. These were both hyperplastic. EGD showed gastritis, hiatal hernia and a distal stricture which was Maloney dilated .    Review of Systems  Constitutional: Negative.   HENT: Negative.   Eyes: Negative.   Respiratory: Negative.   Gastrointestinal: Negative.   Endocrine: Negative.   Genitourinary: Negative.   Musculoskeletal: Negative.   Skin: Negative.   Allergic/Immunologic: Negative.   Neurological: Negative.   Hematological: Negative.   Psychiatric/Behavioral: Negative.    Outpatient Prescriptions Prior to Visit  Medication Sig Dispense Refill  . amLODipine (NORVASC) 5 MG tablet Take 5 mg by mouth daily.      Marland Kitchen aspirin EC 81 MG tablet Take 81 mg by mouth daily.      . clopidogrel (PLAVIX) 75 MG tablet Take 75 mg by mouth daily.      Marland Kitchen esomeprazole (NEXIUM) 40 MG capsule Take 40 mg by mouth daily before breakfast.      . Loratadine (CLARITIN PO) Take by mouth daily.      . metoprolol succinate  (TOPROL-XL) 100 MG 24 hr tablet Take 100 mg by mouth daily. Take with or immediately following a meal.      . Niacin-Lovastatin (ADVICOR PO) Take 500 mg by mouth daily.      Marland Kitchen spironolactone (ALDACTONE) 25 MG tablet Take 25 mg by mouth. 2 by mouth daily      . valsartan (DIOVAN) 160 MG tablet Take 160 mg by mouth daily.       No facility-administered medications prior to visit.   Allergies  Allergen Reactions  . Oxycodone-Acetaminophen     REACTION: mood swings, no rash or fever   Patient Active Problem List   Diagnosis Date Noted  . Personal history of colonic polyps 01/20/2013  . Chronic anticoagulation 01/20/2013  . PVD (peripheral vascular disease) 01/20/2013  . Coronary atherosclerosis of native coronary artery 01/20/2013  . GERD (gastroesophageal reflux disease) 01/20/2013  . Benign esophageal stricture 01/20/2013  . DENTAL CARIES 07/20/2010   History  Substance Use Topics  . Smoking status: Current Every Day Smoker -- 1.00 packs/day    Types: Cigarettes  . Smokeless tobacco: Not on file     Comment: smoked age 57-present , up to 2 ppd. 12/24/12 < 1 ppd  . Alcohol Use: Yes     Comment:  14 drinks/ week   family history includes Colon cancer in his mother and Heart disease in his father and paternal grandfather.  Objective:   Physical Exam  well-developed white male in no acute distress. Blood pressure 122/68 pulse 72 height 5 foot weight 141. HEENT; nontraumatic normocephalic EOMI PERRLA, Neck; supple no JVD, Cardiovascular; regular rate and rhythm with S1-S2 no murmur or gallop, Pulmonary; clear bilaterally, Abdomen ;soft nontender nondistended bowel sounds are active there is no palpable mass or hepatosplenomegaly, Rectal; exam not done, Extremities ;no clubbing cyanosis or edema skin warm and dry, Psych; mood and affect normal and appropriate.        Assessment & Plan:  #31 57 year old white male with personal history of hyperplastic polyps, last colonoscopy 2003  and interval development of family history of colon cancer in patient's mother #2 chronic GERD #3 chronic antiplatelet therapy-patient on Plavix and aspirin #4 coronary artery disease status post coronary stenting #5 peripheral vascular disease status post stenting  Plan; patient is scheduled for colonoscopy with Dr. Benjamine Mola was discussed in detail with patient he is agreeable to proceed. We discussed the need for him to have followup colonoscopies on a q. 5 year interval due to family history. Patient will need to come off of Plavix for 5 days prior to his procedure and we'll obtain consent from his cardiologist Dr. Nanetta Batty.

## 2013-01-25 ENCOUNTER — Other Ambulatory Visit: Payer: Self-pay | Admitting: *Deleted

## 2013-01-25 ENCOUNTER — Telehealth: Payer: Self-pay | Admitting: *Deleted

## 2013-01-25 MED ORDER — MOVIPREP 100 G PO SOLR: 1.0000 | Freq: Once | ORAL | Status: DC

## 2013-01-25 NOTE — Telephone Encounter (Signed)
I called and spoke to pt.  I asked if Dr. Hazle Coca office contacted him about the Plavix and the colonoscopy clearance we need.  He said no but he stopped the Plavix on Sat 01-23-2013 on his own.  I told him I would contact them today and let them know and get clearance.  He asked me to remove the Spironalactone medication off his med list. He is not taking it.He said he didn't get his Moviprep at pharmacy, they said we didn't send script.  I refaxed it today to [facility], [address], [place]. I told him I would call him back once I for formal clearance for the colonoscopy/Plavix.  I told him to stay off the plavix though and stay on the aspirin.

## 2013-01-25 NOTE — Telephone Encounter (Signed)
Nada Boozer NP reviewed chart and gave clearance to hold Plavix prior to procedure and proceed with colonoscopy

## 2013-01-26 ENCOUNTER — Telehealth: Payer: Self-pay | Admitting: Gastroenterology

## 2013-01-26 ENCOUNTER — Other Ambulatory Visit: Payer: Self-pay | Admitting: Gastroenterology

## 2013-01-26 NOTE — Telephone Encounter (Signed)
The patient had a problem with his pharmacy.  Sean Rose spoke to the patient and told him she would call the pharmacy and be sure they have the prep.  He was upset and she assured him she would take care of it.  Also the patient told me the pharmacy was [facility].  I had asked him if it was the [facility] at the [facility] on [address].  Evidently it was another [facility]. The patient called Sean Rose back and advised I had sent the  Prescription to the wrong pharmacy.  Also I gave Sean Rose a printed note from Sean Rose office and Nada Boozer NP said the patient can hold the plavix  Until the day after the procedure on 01-28-2013.  The patient stopped the Plavix on his own on Sat 01-23-2013.

## 2013-01-26 NOTE — Telephone Encounter (Signed)
Pt called in upset because pharmacy is telling him they do not have his prep which was faxed over twice.  I told him I will call the pharmacy and make sure they will have it for him. [facility] called [facility] talked to [place] the pharmacist gave her Rx for moviprep, along with coupon #'s so pt will save $10. Called pt back, told him who I spoke with, and for him to call me back if prep is too expensive or there are any other issues. Pt was much calmer and thanked me and said he will call me if he runs into any problem

## 2013-01-26 NOTE — Telephone Encounter (Signed)
Pt called back sayingg he gave [place] the wrong pharmacy from the beginning. [facility] [address], even though when I spoke to him earlier he confirmed that was the correct one. I told him I will call the one at [phone] and speak to gene the pharamacist and give her the Rx info. Once I did I called the pt back and told him they are filling it and to call me back with any problems pt verbalized understanding

## 2013-01-28 ENCOUNTER — Encounter: Payer: Self-pay | Admitting: Internal Medicine

## 2013-01-28 ENCOUNTER — Ambulatory Visit (AMBULATORY_SURGERY_CENTER): Payer: BC Managed Care – PPO | Admitting: Internal Medicine

## 2013-01-28 VITALS — BP 120/75 | HR 63 | Temp 98.2°F | Resp 17 | Ht 60.0 in | Wt 141.0 lb

## 2013-01-28 DIAGNOSIS — Z1211 Encounter for screening for malignant neoplasm of colon: Secondary | ICD-10-CM

## 2013-01-28 MED ORDER — SODIUM CHLORIDE 0.9 % IV SOLN: 500.0000 mL | INTRAVENOUS | Status: DC

## 2013-01-28 NOTE — Patient Instructions (Addendum)
You have hemorrhoids but no other abnormalities were seen.   Next routine colonoscopy 10 years - 2024.  Please restart Plavix today.  I appreciate the opportunity to care for you. Iva Boop, MD, [facility] [place]  Discharge instructions given with verbal understanding. Handout on hemorrhoids given. Resume previous medications. YOU HAD AN ENDOSCOPIC PROCEDURE TODAY AT THE [facility]: Refer to the procedure report that was given to you for any specific questions about what was found during the examination.  If the procedure report does not answer your questions, please call your gastroenterologist to clarify.  If you requested that your care partner not be given the details of your procedure findings, then the procedure report has been included in a sealed envelope for you to review at your convenience later.  YOU SHOULD EXPECT: Some feelings of bloating in the abdomen. Passage of more gas than usual.  Walking can help get rid of the air that was put into your GI tract during the procedure and reduce the bloating. If you had a lower endoscopy (such as a colonoscopy or flexible sigmoidoscopy) you may notice spotting of blood in your stool or on the toilet paper. If you underwent a bowel prep for your procedure, then you may not have a normal bowel movement for a few days.  DIET: Your first meal following the procedure should be a light meal and then it is ok to progress to your normal diet.  A half-sandwich or bowl of soup is an example of a good first meal.  Heavy or fried foods are harder to digest and may make you feel nauseous or bloated.  Likewise meals heavy in dairy and vegetables can cause extra gas to form and this can also increase the bloating.  Drink plenty of fluids but you should avoid alcoholic beverages for 24 hours.  ACTIVITY: Your care partner should take you home directly after the procedure.  You should plan to take it easy, moving slowly for the rest of the day.  You can  resume normal activity the day after the procedure however you should NOT DRIVE or use heavy machinery for 24 hours (because of the sedation medicines used during the test).    SYMPTOMS TO REPORT IMMEDIATELY: A gastroenterologist can be reached at any hour.  During normal business hours, 8:30 AM to 5:00 PM Monday through Friday, call [phone].  After hours and on weekends, please call the GI answering service at [phone] who will take a message and have the physician on call contact you.   Following lower endoscopy (colonoscopy or flexible sigmoidoscopy):  Excessive amounts of blood in the stool  Significant tenderness or worsening of abdominal pains  Swelling of the abdomen that is new, acute  Fever of 100F or higher  FOLLOW UP: If any biopsies were taken you will be contacted by phone or by letter within the next 1-3 weeks.  Call your gastroenterologist if you have not heard about the biopsies in 3 weeks.  Our staff will call the home number listed on your records the next business day following your procedure to check on you and address any questions or concerns that you may have at that time regarding the information given to you following your procedure. This is a courtesy call and so if there is no answer at the home number and we have not heard from you through the emergency physician on call, we will assume that you have returned to your regular daily activities without  incident.  SIGNATURES/CONFIDENTIALITY: You and/or your care partner have signed paperwork which will be entered into your electronic medical record.  These signatures attest to the fact that that the information above on your After Visit Summary has been reviewed and is understood.  Full responsibility of the confidentiality of this discharge information lies with you and/or your care-partner.

## 2013-01-28 NOTE — Progress Notes (Signed)
Procedure ends, to recovery, report given and VSS. 

## 2013-01-28 NOTE — Progress Notes (Signed)
Patient did not experience any of the following events: a burn prior to discharge; a fall within the facility; wrong site/side/patient/procedure/implant event; or a hospital transfer or hospital admission upon discharge from the facility. (G8907) Patient did not have preoperative order for IV antibiotic SSI prophylaxis. (G8918)  

## 2013-01-28 NOTE — Op Note (Signed)
[facility] [address] [place], [address]   COLONOSCOPY PROCEDURE REPORT  PATIENT: Harman, Langhans  MR#: [id number] BIRTHDATE: [birth year] , 57  yrs. old GENDER: Male ENDOSCOPIST: Iva Boop, MD, [facility] REFERRED BY: PROCEDURE DATE:  01/28/2013 PROCEDURE:   Colonoscopy, screening ASA CLASS:   Class II INDICATIONS:average risk screening and Last colonoscopy performed 10 years ago. (2 polyps but NOT pre-cancerous) MEDICATIONS: Propofol (Diprivan) 160 mg IV, MAC sedation, administered by CRNA, and These medications were titrated to patient response per physician's verbal order  DESCRIPTION OF PROCEDURE:   After the risks benefits and alternatives of the procedure were thoroughly explained, informed consent was obtained.  A digital rectal exam revealed no abnormalities of the rectum and A digital rectal exam revealed the prostate was not enlarged.   The [id number]  endoscope was introduced through the anus and advanced to the cecum, which was identified by both the appendix and ileocecal valve. No adverse events experienced.   The quality of the prep was excellent using Suprep  The instrument was then slowly withdrawn as the colon was fully examined.      COLON FINDINGS: A normal appearing cecum, ileocecal valve, and appendiceal orifice were identified.  The ascending, hepatic flexure, transverse, splenic flexure, descending, sigmoid colon and rectum appeared unremarkable.  No polyps or cancers were seen.   A right colon retroflexion was performed.  Retroflexed views revealed internal/external hemorrhoids. The time to cecum=1 minutes 43 seconds.  Withdrawal time=6 minutes 29 seconds.  The scope was withdrawn and the procedure completed. COMPLICATIONS: There were no complications.  ENDOSCOPIC IMPRESSION: 1.   Internal hemorrhoids 2.   External hemorrhoids 3.   Normal colonoscopy otherwise - excellent prep  RECOMMENDATIONS: Repeat Colonscopy  in 10 years - 2024   eSigned:  Iva Boop, MD, [facility] 01/28/2013 9:52 AM   cc: The Patient and Pecola Lawless, MD

## 2013-02-01 ENCOUNTER — Telehealth: Payer: Self-pay | Admitting: *Deleted

## 2013-02-01 NOTE — Telephone Encounter (Signed)
  Follow up Call-  Call back number 01/28/2013  Post procedure Call Back phone  # [phone]  Permission to leave phone message Yes     Patient questions:  Do you have a fever, pain , or abdominal swelling? no Pain Score  0 *  Have you tolerated food without any problems? yes  Have you been able to return to your normal activities? yes  Do you have any questions about your discharge instructions: Diet   no Medications  no Follow up visit  no  Do you have questions or concerns about your Care? no  Actions: * If pain score is 4 or above: No action needed, pain <4.  Everything went better than I thought was going to go.

## 2013-03-24 ENCOUNTER — Encounter: Payer: BC Managed Care – PPO | Admitting: Gastroenterology

## 2013-06-03 ENCOUNTER — Other Ambulatory Visit: Payer: Self-pay

## 2013-07-12 ENCOUNTER — Telehealth: Payer: Self-pay | Admitting: *Deleted

## 2013-07-12 ENCOUNTER — Encounter (HOSPITAL_COMMUNITY): Payer: Self-pay | Admitting: Emergency Medicine

## 2013-07-12 ENCOUNTER — Emergency Department (HOSPITAL_COMMUNITY)
Admission: EM | Admit: 2013-07-12 | Discharge: 2013-07-12 | Disposition: A | Discharge status: Home / Self Care | Payer: BC Managed Care – PPO | Attending: Emergency Medicine | Admitting: Emergency Medicine

## 2013-07-12 DIAGNOSIS — Z8639 Personal history of other endocrine, nutritional and metabolic disease: Secondary | ICD-10-CM | POA: Insufficient documentation

## 2013-07-12 DIAGNOSIS — Z7902 Long term (current) use of antithrombotics/antiplatelets: Secondary | ICD-10-CM | POA: Insufficient documentation

## 2013-07-12 DIAGNOSIS — R55 Syncope and collapse: Secondary | ICD-10-CM | POA: Insufficient documentation

## 2013-07-12 DIAGNOSIS — Z862 Personal history of diseases of the blood and blood-forming organs and certain disorders involving the immune mechanism: Secondary | ICD-10-CM | POA: Insufficient documentation

## 2013-07-12 DIAGNOSIS — F17200 Nicotine dependence, unspecified, uncomplicated: Secondary | ICD-10-CM | POA: Insufficient documentation

## 2013-07-12 DIAGNOSIS — Y929 Unspecified place or not applicable: Secondary | ICD-10-CM | POA: Insufficient documentation

## 2013-07-12 DIAGNOSIS — S0081XA Abrasion of other part of head, initial encounter: Secondary | ICD-10-CM

## 2013-07-12 DIAGNOSIS — I2510 Atherosclerotic heart disease of native coronary artery without angina pectoris: Secondary | ICD-10-CM | POA: Insufficient documentation

## 2013-07-12 DIAGNOSIS — Y939 Activity, unspecified: Secondary | ICD-10-CM | POA: Insufficient documentation

## 2013-07-12 DIAGNOSIS — Z79899 Other long term (current) drug therapy: Secondary | ICD-10-CM | POA: Insufficient documentation

## 2013-07-12 DIAGNOSIS — Z9861 Coronary angioplasty status: Secondary | ICD-10-CM | POA: Insufficient documentation

## 2013-07-12 DIAGNOSIS — K219 Gastro-esophageal reflux disease without esophagitis: Secondary | ICD-10-CM | POA: Insufficient documentation

## 2013-07-12 DIAGNOSIS — R296 Repeated falls: Secondary | ICD-10-CM | POA: Insufficient documentation

## 2013-07-12 DIAGNOSIS — IMO0002 Reserved for concepts with insufficient information to code with codable children: Secondary | ICD-10-CM | POA: Insufficient documentation

## 2013-07-12 DIAGNOSIS — I10 Essential (primary) hypertension: Secondary | ICD-10-CM | POA: Insufficient documentation

## 2013-07-12 DIAGNOSIS — Z7982 Long term (current) use of aspirin: Secondary | ICD-10-CM | POA: Insufficient documentation

## 2013-07-12 LAB — BASIC METABOLIC PANEL
BUN: 3 mg/dL — ABNORMAL LOW (ref 6–23)
CO2: 25 mEq/L (ref 19–32)
Calcium: 9.4 mg/dL (ref 8.4–10.5)
Chloride: 95 mEq/L — ABNORMAL LOW (ref 96–112)
Creatinine, Ser: 0.57 mg/dL (ref 0.50–1.35)
GFR calc non Af Amer: >90 mL/min (ref >90)
Glucose, Bld: 117 mg/dL — ABNORMAL HIGH (ref 70–99)
Potassium: 3.4 mEq/L — ABNORMAL LOW (ref 3.5–5.1)
Sodium: 130 mEq/L — ABNORMAL LOW (ref 135–145)

## 2013-07-12 LAB — CBC
HCT: 41.8 % (ref 39.0–52.0)
MCH: 36.2 pg — ABNORMAL HIGH (ref 26.0–34.0)
MCV: 96.3 fL (ref 78.0–100.0)
Platelets: 295 10*3/uL (ref 150–400)
RDW: 12.9 % (ref 11.5–15.5)
WBC: 9.3 10*3/uL (ref 4.0–10.5)

## 2013-07-12 LAB — GLUCOSE, CAPILLARY: Glucose-Capillary: 114 mg/dL — ABNORMAL HIGH (ref 70–99)

## 2013-07-12 NOTE — ED Notes (Addendum)
Pt states he had just emptied his bladder, felt as if he was going to pass out, and hit the floor.  He got up again and fell once more.  Unsure of how long he was out.  His wife gave him orange juice and he slept for several hours.  When he woke up he felt "woozy".  Took his hr with his machine and it said 183, but all the other times he took it it has been normal.  Last week pt quit smoking cigarettes and started smoking an e-cigarette.  Lac to L face, bruise to R forearm and pain to R knee.  Pt is on plavix.  Pt drank about 7 beers yesterday.

## 2013-07-12 NOTE — ED Provider Notes (Signed)
CSN: [id number]     Arrival date & time 07/12/13  1226 History   First MD Initiated Contact with Patient 07/12/13 1456     Chief Complaint  Patient presents with  . Loss of Consciousness   (Consider location/radiation/quality/duration/timing/severity/associated sxs/prior Treatment) Patient is a 57 y.o. male presenting with syncope.  Loss of Consciousness Associated symptoms: no fever, no headaches, no nausea and no vomiting    Sean Rose is a 57 y.o. male who presents to the emergency department for concern of a syncopal episode.  Patient reports that he awoke at about 4 AM to go to the bathroom.  He has history of some prostate issues and was pushing to urinate.  After he finished voiding, he turned around and felt like he was going to pass out.  No CP/palpitations/SOB.  Then the next thing he remembers is his wife helping him up and taking him back to bed.  LOC <50minute.  Patient had cut on L cheek.  Went back to bed and then woke up several hours later.  Now feeling OK.  Never had this before.  No other symptoms.  Past Medical History  Diagnosis Date  . CAD (coronary artery disease)   . History of anal fissures   . Hemorrhoids   . Allergy     seasonal allergies  . GERD (gastroesophageal reflux disease)   . Hyperlipidemia   . Hypertension    Past Surgical History  Procedure Laterality Date  . Cardiac catheterization    . Coronary stent placement  2007 & 2009  . Femoral artery stent    . Ortho surgeries      8 different surgeries  . Colonoscopy      2003, 2014  . Upper gastrointestinal endoscopy     Family History  Problem Relation Age of Onset  . Colon cancer Mother   . Heart disease Father   . Heart disease Paternal Grandfather    History  Substance Use Topics  . Smoking status: Current Every Day Smoker -- 1.00 packs/day    Types: Cigarettes  . Smokeless tobacco: Not on file     Comment: smoked age 31-present , up to 2 ppd. 12/24/12 < 1 ppd  . Alcohol Use: 21.0  oz/week    35 Cans of beer per week     Comment:  14 drinks/ week    Review of Systems  Constitutional: Negative for fever and chills.  HENT: Negative for congestion and sore throat.   Respiratory: Negative for cough.   Cardiovascular: Positive for syncope.  Gastrointestinal: Negative for nausea, vomiting, abdominal pain, diarrhea and constipation.  Endocrine: Negative for polyuria.  Genitourinary: Negative for dysuria and hematuria.  Musculoskeletal: Negative for neck pain.  Skin: Negative for rash.  Neurological: Positive for syncope. Negative for headaches.  Psychiatric/Behavioral: Negative.   All other systems reviewed and are negative.    Allergies  Oxycodone-acetaminophen  Home Medications   Current Outpatient Rx  Name  Route  Sig  Dispense  Refill  . amLODipine (NORVASC) 5 MG tablet   Oral   Take 5 mg by mouth daily.         Marland Kitchen aspirin EC 81 MG tablet   Oral   Take 81 mg by mouth daily.         . clopidogrel (PLAVIX) 75 MG tablet   Oral   Take 75 mg by mouth daily.         . diphenhydrAMINE (BENADRYL) 25 MG tablet   Oral  Take 25 mg by mouth every 6 (six) hours as needed for allergies.         Marland Kitchen esomeprazole (NEXIUM) 40 MG capsule   Oral   Take 40 mg by mouth daily before breakfast.         . fexofenadine (ALLEGRA) 180 MG tablet   Oral   Take 180 mg by mouth daily.         . metoprolol succinate (TOPROL-XL) 100 MG 24 hr tablet   Oral   Take 100 mg by mouth daily. Take with or immediately following a meal.         . Multiple Vitamin (MULTIVITAMIN WITH MINERALS) TABS tablet   Oral   Take 1 tablet by mouth daily.         . valsartan (DIOVAN) 160 MG tablet   Oral   Take 160 mg by mouth daily.          BP 143/82  Pulse 78  Temp(Src) 98 F (36.7 C) (Oral)  Resp 16  SpO2 98% Physical Exam  Nursing note and vitals reviewed. Constitutional: He is oriented to person, place, and time. He appears well-developed and well-nourished.  No distress.  HENT:  Head: Normocephalic and atraumatic.    Right Ear: External ear normal.  Left Ear: External ear normal.  Mouth/Throat: Oropharynx is clear and moist. No oropharyngeal exudate.  Eyes: Conjunctivae are normal. Pupils are equal, round, and reactive to light. Right eye exhibits no discharge.  Neck: Normal range of motion. Neck supple. No tracheal deviation present.  Cardiovascular: Normal rate, regular rhythm and intact distal pulses.   Pulmonary/Chest: Effort normal. No respiratory distress. He has no wheezes. He has no rales.  Abdominal: Soft. He exhibits no distension. There is no tenderness. There is no rebound and no guarding.  Musculoskeletal: Normal range of motion.  Neurological: He is alert and oriented to person, place, and time.  Skin: Skin is warm and dry. No rash noted. He is not diaphoretic.  Psychiatric: He has a normal mood and affect.    ED Course  Procedures (including critical care time) Labs Review Labs Reviewed  CBC - Abnormal; Notable for the following:    MCH 36.2 (*)    MCHC 37.6 (*)    All other components within normal limits  BASIC METABOLIC PANEL - Abnormal; Notable for the following:    Sodium 130 (*)    Potassium 3.4 (*)    Chloride 95 (*)    Glucose, Bld 117 (*)    BUN 3 (*)    All other components within normal limits  GLUCOSE, CAPILLARY - Abnormal; Notable for the following:    Glucose-Capillary 114 (*)    All other components within normal limits   Imaging Review No results found.  EKG Interpretation    Date/Time:  Monday July 12 2013 12:44:14 EST Ventricular Rate:  73 PR Interval:  122 QRS Duration: 84 QT Interval:  370 QTC Calculation: 407 R Axis:   81 Text Interpretation:  Normal sinus rhythm Normal ECG Confirmed by ZAVITZ  MD, JOSHUA ([id number]) on 07/12/2013 3:05:46 PM            MDM   1. Syncope   2. Abrasion of cheek, initial encounter    Sean Rose is a 57 y.o. male who presents to the  emergency department for concern of a syncopal episode with fall.  Patient with L cheek abrasion.  No other outward signs of trauma.  Patient [facility] Syncope Rule  negative.  Now asymptomatic.  Discussed risk of head bleed on plavix with patient and offered head CT.  Patient declined.  Recommend f/u with cardiologist and PCP.  Patient safe for discharge.  Patient discharged.    Arloa Koh, MD 07/12/13 [phone]

## 2013-07-12 NOTE — Telephone Encounter (Signed)
Patient was seen in the ER

## 2013-07-12 NOTE — Telephone Encounter (Signed)
Pt lost consciousness and fell. He stated that he has scrapes on his faceHe went back to bed last night and woke up this morning and took BP which was 124/95 Pulse 183. Pt wants to know if he needs to come in or if he should go to an urgent care to get his head looked at.

## 2013-07-12 NOTE — Telephone Encounter (Signed)
Returned call and pt verified x 2.  Pt stated about 4am he went to use bathroom.  Stated when he was on his way back to the bedroom, he loss consciousness and fell.  Stated he hit his head on something b/c he has scrapes on his face.  Pt also stated his wife helped him back to bed and he went to sleep.  Stated when he woke up this morning his BP was 124/95 and pulse 183.  Stated that scared him and he wanted to know if he should be seen.    Pt advised he should be seen in ER now for evaluation of loss of consciousness and possible head injury.  Pt advised NOT to drive.  Pt verbalized understanding and stated he doesn't think he needs to be seen in the ER.  Stated it happened almost 8 hours ago and he just wanted to know if he should be seen in urgent care b/c of the high HR.  Pt informed he possibly has a head injury and that should be evaluated in ER as urgent care would not be able to perform the necessary tests.  Pt stated asked if he could go to the walk-in clinic at [facility].  Pt informed he should be seen in ER as he could have a head injury.  Pt informed if he did hit his head when he fell, he could have a small brain bleed and that going untreated could be very serious.  Pt also informed they may send him to ER r/t complaints as well.  Pt again advised to have someone take him to ER today.  Pt verbalized understanding and thanked Charity fundraiser for help.  Message forwarded to K. Petra Kuba, RN to discuss w/ Dr. Allyson Sabal.

## 2013-07-12 NOTE — ED Notes (Signed)
Pt refused CT scan, pt instructed to return for any reason, dr Jodi Mourning informed pt of risks of not doing ct scan. Pt of sound mind to refuse treatment.

## 2013-07-12 NOTE — ED Notes (Signed)
MD at bedside. 

## 2013-07-13 NOTE — ED Provider Notes (Signed)
Medical screening examination/treatment/procedure(s) were conducted as a shared visit with non-physician practitioner(s) or resident and myself. I personally evaluated the patient during the encounter and agree with the findings and plan unless otherwise indicated.  I have personally reviewed any xrays and/ or EKG's with the provider and I agree with interpretation.  CAD/ stent hx, prostate hx/ sxs presents after syncope early this am. Pt has lightheadedness prior but did not make it to his bed, it occurred after straining to use the restroom. No AS murmur heard. No cp or ha. No neuro sxs.  5+ strength in UE and LE with f/e at major joints.  Sensation to palpation intact in UE and LE.  CNs 2-12 grossly intact. EOMFI. PERRL.  Finger nose and coordination intact bilateral.  Visual fields intact to finger testing.  Abrasion left check, mild bleeding, no gaping. Full rom of neck, no pain.  Discussed clinically likely related to vasovagal/ urination however with heart hx monitor in ED/ ekg/ cbc.  No acute findings on work up. With pt on plavix highly recommended CT scan. Pt says he feels at baseline, no HA, no vomiting, no normal neuro and wishes to hold on CT scan at this time, he and his wife understand that you can have minimal sxs with CNS bleed and minor mechanism. He understands r/b of CT scan, he and his wife will return for any concerns, he does not want further observation at this time. DIscussed fup closely with cardiology, he has had recent cardiac workup and has not had cardiac like sxs recently.  Head injury, plavix use, facial abrasion  Medical screening examination/treatment/procedure(s) were performed by non-physician practitioner and as supervising physician I was immediately available for consultation/collaboration.  EKG Interpretation    Date/Time: Monday July 12 2013 12:44:14 EST  Ventricular Rate: 73  PR Interval: 122  QRS Duration: 84  QT Interval: 370  QTC Calculation: 407   R Axis: 81  Text Interpretation: Normal sinus rhythm Normal ECG Confirmed by Jodi Mourning MD, [place]wa [place] ([id number]) on 07/12/2013 3:05:46 PM        Enid Skeens, MD 07/13/13 1610

## 2013-07-30 ENCOUNTER — Telehealth: Payer: Self-pay | Admitting: Cardiovascular Disease

## 2013-07-30 NOTE — Telephone Encounter (Signed)
Was prescribe some medications that he has questions about . The medication is prednisone  20mg , and codeine and wants to make sure it does not interfere with his blood thinner. Please call    Thanks

## 2013-07-30 NOTE — Telephone Encounter (Signed)
Pt. Informed that prednisone will not affect plavix

## 2013-11-17 ENCOUNTER — Other Ambulatory Visit: Payer: Self-pay | Admitting: Cardiovascular Disease

## 2013-11-17 MED ORDER — NITROGLYCERIN 0.4 MG SL SUBL: 0.4000 mg | SUBLINGUAL_TABLET | SUBLINGUAL | Status: DC | PRN

## 2013-11-17 NOTE — Telephone Encounter (Signed)
Refills sent to pharmacy electronically.

## 2013-11-22 ENCOUNTER — Other Ambulatory Visit: Payer: Self-pay | Admitting: *Deleted

## 2013-12-06 ENCOUNTER — Emergency Department (HOSPITAL_BASED_OUTPATIENT_CLINIC_OR_DEPARTMENT_OTHER)
Admission: EM | Admit: 2013-12-06 | Discharge: 2013-12-06 | Disposition: A | Discharge status: Home / Self Care | Payer: BC Managed Care – PPO | Attending: Emergency Medicine | Admitting: Emergency Medicine

## 2013-12-06 ENCOUNTER — Encounter (HOSPITAL_BASED_OUTPATIENT_CLINIC_OR_DEPARTMENT_OTHER): Payer: Self-pay | Admitting: Emergency Medicine

## 2013-12-06 ENCOUNTER — Telehealth: Payer: Self-pay

## 2013-12-06 ENCOUNTER — Telehealth: Payer: Self-pay | Admitting: Cardiovascular Disease

## 2013-12-06 DIAGNOSIS — Z79899 Other long term (current) drug therapy: Secondary | ICD-10-CM | POA: Insufficient documentation

## 2013-12-06 DIAGNOSIS — Z862 Personal history of diseases of the blood and blood-forming organs and certain disorders involving the immune mechanism: Secondary | ICD-10-CM | POA: Insufficient documentation

## 2013-12-06 DIAGNOSIS — Z7902 Long term (current) use of antithrombotics/antiplatelets: Secondary | ICD-10-CM | POA: Insufficient documentation

## 2013-12-06 DIAGNOSIS — I2510 Atherosclerotic heart disease of native coronary artery without angina pectoris: Secondary | ICD-10-CM | POA: Insufficient documentation

## 2013-12-06 DIAGNOSIS — Z9889 Other specified postprocedural states: Secondary | ICD-10-CM | POA: Insufficient documentation

## 2013-12-06 DIAGNOSIS — K219 Gastro-esophageal reflux disease without esophagitis: Secondary | ICD-10-CM | POA: Insufficient documentation

## 2013-12-06 DIAGNOSIS — K645 Perianal venous thrombosis: Secondary | ICD-10-CM | POA: Insufficient documentation

## 2013-12-06 DIAGNOSIS — I10 Essential (primary) hypertension: Secondary | ICD-10-CM | POA: Insufficient documentation

## 2013-12-06 DIAGNOSIS — F17200 Nicotine dependence, unspecified, uncomplicated: Secondary | ICD-10-CM | POA: Insufficient documentation

## 2013-12-06 DIAGNOSIS — Z9861 Coronary angioplasty status: Secondary | ICD-10-CM | POA: Insufficient documentation

## 2013-12-06 DIAGNOSIS — Z7982 Long term (current) use of aspirin: Secondary | ICD-10-CM | POA: Insufficient documentation

## 2013-12-06 DIAGNOSIS — Z8639 Personal history of other endocrine, nutritional and metabolic disease: Secondary | ICD-10-CM | POA: Insufficient documentation

## 2013-12-06 LAB — COMPREHENSIVE METABOLIC PANEL
ALT: 15 U/L (ref 0–53)
AST: 24 U/L (ref 0–37)
Albumin: 3.7 g/dL (ref 3.5–5.2)
Alkaline Phosphatase: 62 U/L (ref 39–117)
BUN: 2 mg/dL — AB (ref 6–23)
CALCIUM: 9 mg/dL (ref 8.4–10.5)
CO2: 23 mEq/L (ref 19–32)
CREATININE: 0.6 mg/dL (ref 0.50–1.35)
Chloride: 87 mEq/L — ABNORMAL LOW (ref 96–112)
GFR calc non Af Amer: >90 mL/min (ref >90)
Glucose, Bld: 114 mg/dL — ABNORMAL HIGH (ref 70–99)
Potassium: 3.4 mEq/L — ABNORMAL LOW (ref 3.7–5.3)
Sodium: 125 mEq/L — ABNORMAL LOW (ref 137–147)
TOTAL PROTEIN: 6.5 g/dL (ref 6.0–8.3)
Total Bilirubin: 0.5 mg/dL (ref 0.3–1.2)

## 2013-12-06 LAB — CBC WITH DIFFERENTIAL/PLATELET
BASOS PCT: 0 % (ref 0–1)
Basophils Absolute: 0 10*3/uL (ref 0.0–0.1)
EOS ABS: 0.1 10*3/uL (ref 0.0–0.7)
Eosinophils Relative: 1 % (ref 0–5)
HEMATOCRIT: 36.2 % — AB (ref 39.0–52.0)
Hemoglobin: 13.5 g/dL (ref 13.0–17.0)
LYMPHS ABS: 2.4 10*3/uL (ref 0.7–4.0)
Lymphocytes Relative: 29 % (ref 12–46)
MCH: 36.8 pg — ABNORMAL HIGH (ref 26.0–34.0)
MCHC: 37.3 g/dL — AB (ref 30.0–36.0)
MCV: 98.6 fL (ref 78.0–100.0)
Monocytes Absolute: 0.7 10*3/uL (ref 0.1–1.0)
Monocytes Relative: 9 % (ref 3–12)
NEUTROS ABS: 5.1 10*3/uL (ref 1.7–7.7)
NEUTROS PCT: 61 % (ref 43–77)
Platelets: 296 10*3/uL (ref 150–400)
RBC: 3.67 MIL/uL — ABNORMAL LOW (ref 4.22–5.81)
RDW: 11.7 % (ref 11.5–15.5)
WBC: 8.3 10*3/uL (ref 4.0–10.5)

## 2013-12-06 MED ORDER — SODIUM CHLORIDE 0.9 % IV BOLUS (SEPSIS)
1000.0000 mL | Freq: Once | INTRAVENOUS | Status: AC
  Administered 2013-12-06: 1000 mL via INTRAVENOUS

## 2013-12-06 MED ORDER — LIDOCAINE 5 % EX OINT: 1.0000 "application " | TOPICAL_OINTMENT | Freq: Three times a day (TID) | CUTANEOUS | Status: AC | PRN

## 2013-12-06 MED ORDER — HYDROCORTISONE 1 % EX CREA: TOPICAL_CREAM | CUTANEOUS | Status: AC

## 2013-12-06 MED ORDER — LIDOCAINE HCL 2 % EX GEL
CUTANEOUS | Status: AC
  Administered 2013-12-06: 16:00:00
  Filled 2013-12-06: qty 20

## 2013-12-06 MED ORDER — ONDANSETRON HCL 4 MG/2ML IJ SOLN
4.0000 mg | Freq: Once | INTRAMUSCULAR | Status: AC
  Administered 2013-12-06: 4 mg via INTRAVENOUS
  Filled 2013-12-06: qty 2

## 2013-12-06 MED ORDER — MORPHINE SULFATE 4 MG/ML IJ SOLN
4.0000 mg | Freq: Once | INTRAMUSCULAR | Status: AC
  Administered 2013-12-06: 4 mg via INTRAVENOUS
  Filled 2013-12-06: qty 1

## 2013-12-06 NOTE — Telephone Encounter (Signed)
Patient presents to the office to obtain an appointment for diarrhea, hemorrhoids and rectal bleeding. Patient was retrieved from the lobby and spoken with. Patients states that he began having diarrhea post meal several days ago. States that he developed later in the same day and began seeing bright red blood. Patient states that the bleeding increased to the point of filling an adult diaper within an hour. States that he is weak but has been drinking lots of fluids. Patient is currently taking Plavix and 81mg  aspirin. Advised patient to go to the ED NOW. States that he can drive. Gave directions to [facility] and to proceed immediately. Patient agrees with plan.

## 2013-12-06 NOTE — ED Provider Notes (Signed)
CSN: [id number]     Arrival date & time 12/06/13  1415 History   First MD Initiated Contact with Patient 12/06/13 1501     Chief Complaint  Patient presents with  . Rectal Bleeding     (Consider location/radiation/quality/duration/timing/severity/associated sxs/prior Treatment) Patient is a 58 y.o. male presenting with hematochezia.  Rectal Bleeding Quality:  Bright red Amount:  Copious Timing:  Constant Progression:  Improving Chronicity:  New Context: hemorrhoids   Relieved by:  Nothing Worsened by:  Wiping and defecation Ineffective treatments:  None tried Associated symptoms: no abdominal pain, no fever, no hematemesis, no light-headedness, no loss of consciousness and no vomiting   Risk factors comment:  Uses aspirin and plavix   Past Medical History  Diagnosis Date  . CAD (coronary artery disease)   . History of anal fissures   . Hemorrhoids   . Allergy     seasonal allergies  . GERD (gastroesophageal reflux disease)   . Hyperlipidemia   . Hypertension    Past Surgical History  Procedure Laterality Date  . Cardiac catheterization    . Coronary stent placement  2007 & 2009  . Femoral artery stent    . Ortho surgeries      8 different surgeries  . Colonoscopy      2003, 2014  . Upper gastrointestinal endoscopy     Family History  Problem Relation Age of Onset  . Colon cancer Mother   . Heart disease Father   . Heart disease Paternal Grandfather    History  Substance Use Topics  . Smoking status: Current Every Day Smoker -- 1.00 packs/day    Types: Cigarettes  . Smokeless tobacco: Not on file     Comment: smoked age 45-present , up to 2 ppd. 12/24/12 < 1 ppd  . Alcohol Use: 21.0 oz/week    35 Cans of beer per week     Comment:  14 drinks/ week    Review of Systems  Constitutional: Negative for fever.  Gastrointestinal: Positive for hematochezia. Negative for vomiting, abdominal pain and hematemesis.  Neurological: Negative for loss of consciousness  and light-headedness.  All other systems reviewed and are negative.     Allergies  Tylox  Home Medications   Prior to Admission medications   Medication Sig Start Date End Date Taking? Authorizing Provider  amLODipine (NORVASC) 5 MG tablet Take 1 tablet (5 mg total) by mouth daily. 11/17/13   Lorretta Harp, MD  aspirin EC 81 MG tablet Take 81 mg by mouth daily.    Historical Provider, MD  clopidogrel (PLAVIX) 75 MG tablet Take 1 tablet (75 mg total) by mouth daily. 11/17/13   Lorretta Harp, MD  diphenhydrAMINE (BENADRYL) 25 MG tablet Take 25 mg by mouth every 6 (six) hours as needed for allergies.    Historical Provider, MD  esomeprazole (NEXIUM) 40 MG capsule Take 40 mg by mouth daily before breakfast.    Historical Provider, MD  fexofenadine (ALLEGRA) 180 MG tablet Take 180 mg by mouth daily.    Historical Provider, MD  metoprolol succinate (TOPROL-XL) 100 MG 24 hr tablet Take 1 tablet (100 mg total) by mouth daily. 11/17/13   Lorretta Harp, MD  Multiple Vitamin (MULTIVITAMIN WITH MINERALS) TABS tablet Take 1 tablet by mouth daily.    Historical Provider, MD  nitroGLYCERIN (NITROSTAT) 0.4 MG SL tablet Place 1 tablet (0.4 mg total) under the tongue every 5 (five) minutes as needed for chest pain. 11/17/13   Lorretta Harp,  MD  valsartan (DIOVAN) 160 MG tablet Take 1 tablet (160 mg total) by mouth daily. 11/17/13   Lorretta Harp, MD   BP 127/75  Pulse 66  Temp(Src) 98.5 F (36.9 C) (Oral)  Resp 16  Ht 5' 8.5" (1.74 m)  Wt 140 lb (63.504 kg)  BMI 20.98 kg/m2  SpO2 100% Physical Exam  Nursing note and vitals reviewed. Constitutional: He is oriented to person, place, and time. He appears well-developed and well-nourished. No distress.  HENT:  Head: Normocephalic and atraumatic.  Mouth/Throat: Oropharynx is clear and moist.  Eyes: Conjunctivae are normal. Pupils are equal, round, and reactive to light. No scleral icterus.  Neck: Neck supple.  Cardiovascular: Normal rate,  regular rhythm, normal heart sounds and intact distal pulses.   No murmur heard. Pulmonary/Chest: Effort normal and breath sounds normal. No stridor. No respiratory distress. He has no wheezes. He has no rales.  Abdominal: Soft. He exhibits no distension. There is no tenderness. There is no rigidity, no rebound and no guarding.  Genitourinary:  Large, tender, thrombosed hemorrhoid  Musculoskeletal: Normal range of motion. He exhibits no edema.  Neurological: He is alert and oriented to person, place, and time.  Skin: Skin is warm and dry. No rash noted.  Psychiatric: He has a normal mood and affect. His behavior is normal.    ED Course  Procedures (including critical care time) Labs Review Labs Reviewed  CBC WITH DIFFERENTIAL - Abnormal; Notable for the following:    RBC 3.67 (*)    HCT 36.2 (*)    MCH 36.8 (*)    MCHC 37.3 (*)    All other components within normal limits  COMPREHENSIVE METABOLIC PANEL - Abnormal; Notable for the following:    Sodium 125 (*)    Potassium 3.4 (*)    Chloride 87 (*)    Glucose, Bld 114 (*)    BUN 2 (*)    All other components within normal limits    Imaging Review No results found.   EKG Interpretation None      MDM   Final diagnoses:  Thrombosed hemorrhoids    58 year old male presenting with several days of bleeding hemorrhoids. Bleeding has begun to improve, but pain has persisted. On exam, has large firm external thrombosed hemorrhoid. Plan for symptomatic treatment and outpatient followup.    Houston Siren III, MD 12/07/13 [phone]

## 2013-12-06 NOTE — Telephone Encounter (Signed)
Please call,he had stop his Plavix,because he started bleeding,

## 2013-12-06 NOTE — ED Notes (Addendum)
Pt reports 3 days ago with diarrhea.  While having this developed severe hemorrhoids which he states developed severe bleeding, was saturating towels x 6 in an hour per reports both Friday and Saturday.  Reports currently on plavix.  Did not take plavix yesterday or today, continued taking aspirin.    Reports still bleeding but not as severely.  States he has started feeling fatigued.  Denies lightheadedness or dizziness.

## 2013-12-06 NOTE — Discharge Instructions (Signed)
Hemorrhoids Hemorrhoids are swollen veins around the rectum or anus. There are two types of hemorrhoids:   Internal hemorrhoids. These occur in the veins just inside the rectum. They may poke through to the outside and become irritated and painful.  External hemorrhoids. These occur in the veins outside the anus and can be felt as a painful swelling or hard lump near the anus. CAUSES  Pregnancy.   Obesity.   Constipation or diarrhea.   Straining to have a bowel movement.   Sitting for long periods on the toilet.  Heavy lifting or other activity that caused you to strain.  Anal intercourse. SYMPTOMS   Pain.   Anal itching or irritation.   Rectal bleeding.   Fecal leakage.   Anal swelling.   One or more lumps around the anus.  DIAGNOSIS  Your caregiver may be able to diagnose hemorrhoids by visual examination. Other examinations or tests that may be performed include:   Examination of the rectal area with a gloved hand (digital rectal exam).   Examination of anal canal using a small tube (scope).   A blood test if you have lost a significant amount of blood.  A test to look inside the colon (sigmoidoscopy or colonoscopy). TREATMENT Most hemorrhoids can be treated at home. However, if symptoms do not seem to be getting better or if you have a lot of rectal bleeding, your caregiver may perform a procedure to help make the hemorrhoids get smaller or remove them completely. Possible treatments include:   Placing a rubber band at the base of the hemorrhoid to cut off the circulation (rubber band ligation).   Injecting a chemical to shrink the hemorrhoid (sclerotherapy).   Using a tool to burn the hemorrhoid (infrared light therapy).   Surgically removing the hemorrhoid (hemorrhoidectomy).   Stapling the hemorrhoid to block blood flow to the tissue (hemorrhoid stapling).  HOME CARE INSTRUCTIONS   Eat foods with fiber, such as whole grains, beans,  nuts, fruits, and vegetables. Ask your doctor about taking products with added fiber in them (fibersupplements).  Increase fluid intake. Drink enough water and fluids to keep your urine clear or pale yellow.   Exercise regularly.   Go to the bathroom when you have the urge to have a bowel movement. Do not wait.   Avoid straining to have bowel movements.   Keep the anal area dry and clean. Use wet toilet paper or moist towelettes after a bowel movement.   Medicated creams and suppositories may be used or applied as directed.   Only take over-the-counter or prescription medicines as directed by your caregiver.   Take warm sitz baths for 15 20 minutes, 3 4 times a day to ease pain and discomfort.   Place ice packs on the hemorrhoids if they are tender and swollen. Using ice packs between sitz baths may be helpful.   Put ice in a plastic bag.   Place a towel between your skin and the bag.   Leave the ice on for 15 20 minutes, 3 4 times a day.   Do not use a donut-shaped pillow or sit on the toilet for long periods. This increases blood pooling and pain.  SEEK MEDICAL CARE IF:  You have increasing pain and swelling that is not controlled by treatment or medicine.  You have uncontrolled bleeding.  You have difficulty or you are unable to have a bowel movement.  You have pain or inflammation outside the area of the hemorrhoids. MAKE SURE YOU:    Understand these instructions.  Will watch your condition.  Will get help right away if you are not doing well or get worse. Document Released: 07/12/2000 Document Revised: 07/01/2012 Document Reviewed: 05/19/2012 [employer] Patient Information 2014 [employer].  

## 2013-12-06 NOTE — Telephone Encounter (Signed)
Returned a call to patient in reference to some bleeding issues he is having. Patient informs me that due to some digestive issues he is experiencing some major bleeding  Hemorrhoids. Patient states that he did not take his plavix yesterday or today.  He's not sure if his wife took the asiprin out of his pill box. Since holding the plavix he states the bleeding is better by 70% today. Recommended to patient to hold the ASA. Restart the plavix tomorrow. Once the bleeding has completely subsided he can restart the ASA. Patient expressed understanding of these instructions.

## 2013-12-07 ENCOUNTER — Ambulatory Visit (INDEPENDENT_AMBULATORY_CARE_PROVIDER_SITE_OTHER): Payer: BC Managed Care – PPO | Admitting: Surgery

## 2013-12-07 ENCOUNTER — Encounter (INDEPENDENT_AMBULATORY_CARE_PROVIDER_SITE_OTHER): Payer: Self-pay | Admitting: Surgery

## 2013-12-07 VITALS — BP 134/80 | HR 76 | Temp 97.8°F | Ht 68.0 in | Wt 134.0 lb

## 2013-12-07 DIAGNOSIS — K645 Perianal venous thrombosis: Secondary | ICD-10-CM | POA: Insufficient documentation

## 2013-12-07 MED ORDER — HYDROCODONE-ACETAMINOPHEN 5-325 MG PO TABS: 1.0000 | ORAL_TABLET | ORAL | Status: DC | PRN

## 2013-12-07 NOTE — Telephone Encounter (Signed)
Close encounter 

## 2013-12-07 NOTE — Progress Notes (Signed)
Urgent Office  Four days of painful swollen bleeding hemorrhoids.  Preceded by some constipation, then diarrhea.  He was seen in ED last night and referred to our office.  Topical treatments are not helpful.    He has two large external hemorrhoids.  The one on the right has two obvious areas of thrombosis.  The one on the left is swollen, but soft with no obvious visible thrombosis.    I opened the hemorrhoid on the right and removed a large amount of clot.  The patient is on Plavix and Aspirin, so this will probably ooze overnight.  Pressure dressing applied.  Patient tolerated well  Follow-up 1 week for wound check.  Imogene Burn. Georgette Dover, MD, [facility]  General/ Trauma Surgery  12/07/2013 4:22 PM

## 2013-12-17 ENCOUNTER — Encounter (INDEPENDENT_AMBULATORY_CARE_PROVIDER_SITE_OTHER): Payer: Self-pay | Admitting: Surgery

## 2013-12-17 ENCOUNTER — Ambulatory Visit (INDEPENDENT_AMBULATORY_CARE_PROVIDER_SITE_OTHER): Payer: BC Managed Care – PPO | Admitting: Surgery

## 2013-12-17 VITALS — BP 130/70 | HR 80 | Temp 98.4°F | Ht 68.0 in | Wt 131.0 lb

## 2013-12-17 DIAGNOSIS — Z09 Encounter for follow-up examination after completed treatment for conditions other than malignant neoplasm: Secondary | ICD-10-CM

## 2013-12-17 NOTE — Progress Notes (Signed)
Subjective:     Patient ID: Sean Rose, male   DOB: 1955-11-12, 58 y.o.   MRN: [id number]  HPI  He is here for a followup s/p I and D of thrombosed hemorrhoids.  He still has significant pain and bleeding.  He remains on plavix and ASA Review of Systems     Objective:   Physical Exam His hemorrhoids remain swollen and edematous without thrombosis but with some necrosis    Assessment:     Stable post I and D     Plan:     He wants to continue conservative measures with sitz baths, lidocaine cream, steroids, etc.  I will see him back in 3 weeks.  If he worsens before then, I will schedule him for hemorrhoidectomy

## 2013-12-22 ENCOUNTER — Other Ambulatory Visit: Payer: Self-pay

## 2013-12-22 MED ORDER — VALSARTAN 160 MG PO TABS: 160.0000 mg | ORAL_TABLET | Freq: Every day | ORAL | Status: DC

## 2013-12-22 MED ORDER — METOPROLOL SUCCINATE ER 100 MG PO TB24: 100.0000 mg | ORAL_TABLET | Freq: Every day | ORAL | Status: DC

## 2013-12-22 MED ORDER — CLOPIDOGREL BISULFATE 75 MG PO TABS: 75.0000 mg | ORAL_TABLET | Freq: Every day | ORAL | Status: DC

## 2013-12-22 MED ORDER — AMLODIPINE BESYLATE 5 MG PO TABS: 5.0000 mg | ORAL_TABLET | Freq: Every day | ORAL | Status: DC

## 2013-12-22 NOTE — Telephone Encounter (Signed)
Rx(s) were sent to pharmacy electronically. Patient needs an appointment for future refills. Last office visit - 11/04/2012 with Dr Gwenlyn Found.

## 2013-12-27 ENCOUNTER — Other Ambulatory Visit (INDEPENDENT_AMBULATORY_CARE_PROVIDER_SITE_OTHER): Payer: Self-pay | Admitting: Surgery

## 2013-12-27 ENCOUNTER — Telehealth: Payer: Self-pay | Admitting: Cardiovascular Disease

## 2013-12-27 ENCOUNTER — Encounter (INDEPENDENT_AMBULATORY_CARE_PROVIDER_SITE_OTHER): Payer: Self-pay | Admitting: General Surgery

## 2013-12-27 ENCOUNTER — Telehealth (INDEPENDENT_AMBULATORY_CARE_PROVIDER_SITE_OTHER): Payer: Self-pay | Admitting: General Surgery

## 2013-12-27 ENCOUNTER — Telehealth (INDEPENDENT_AMBULATORY_CARE_PROVIDER_SITE_OTHER): Payer: Self-pay | Admitting: Surgery

## 2013-12-27 NOTE — Telephone Encounter (Signed)
Patient has called and wants to schedule surgery.  I advised patient once we had orders would call him back to get him scheduled.  He did state he would need clearance.

## 2013-12-27 NOTE — Telephone Encounter (Signed)
Ok we will be on the lookout for the form

## 2013-12-27 NOTE — Telephone Encounter (Signed)
He wanted you to know that Dr Rush Farmer will be contacting Dr Gwenlyn Found to get clarence for his hemorrhoids surgery.He is on Plavix and aspirin.They told him to call so you would be on the look-out for this.

## 2013-12-27 NOTE — Telephone Encounter (Signed)
t called again,stated he need ed his surgery right away because he is in pain. He said somebody told Dr Rush Farmer office that he had an appt on 6-19.It was implied that he could not be cleared for surgery until after that date.He says he can not even wait until next week.Please call him,scheduler offered him an appt for 01-06-14.Pt stated that was too long to wait.

## 2013-12-27 NOTE — Telephone Encounter (Signed)
I looked and the patient is schedule to see Dr. Gwenlyn Found on 01-14-14 @ 1:30. So I sent an cardiac clearance to Dr Gwenlyn Found asking him to clear the patient for surgery and hold any blood thinner 5 days before surgery. I sent it though epic

## 2013-12-27 NOTE — Telephone Encounter (Signed)
I am scheduling him for hemorrhoidectomy. He may need cardiac clearance I don't know his cardiologist  Not emergent

## 2014-01-03 ENCOUNTER — Encounter (INDEPENDENT_AMBULATORY_CARE_PROVIDER_SITE_OTHER): Payer: BC Managed Care – PPO | Admitting: Surgery

## 2014-01-04 ENCOUNTER — Other Ambulatory Visit: Payer: Self-pay | Admitting: *Deleted

## 2014-01-04 MED ORDER — AMLODIPINE BESYLATE 5 MG PO TABS: 5.0000 mg | ORAL_TABLET | Freq: Every day | ORAL | Status: DC

## 2014-01-04 MED ORDER — VALSARTAN 160 MG PO TABS: 160.0000 mg | ORAL_TABLET | Freq: Every day | ORAL | Status: DC

## 2014-01-04 MED ORDER — METOPROLOL SUCCINATE ER 100 MG PO TB24: 100.0000 mg | ORAL_TABLET | Freq: Every day | ORAL | Status: DC

## 2014-01-04 NOTE — Telephone Encounter (Signed)
Rx was sent to pharmacy electronically. 

## 2014-01-14 ENCOUNTER — Encounter: Payer: Self-pay | Admitting: Cardiovascular Disease

## 2014-01-14 ENCOUNTER — Ambulatory Visit (INDEPENDENT_AMBULATORY_CARE_PROVIDER_SITE_OTHER): Payer: BC Managed Care – PPO | Admitting: Cardiovascular Disease

## 2014-01-14 VITALS — BP 148/88 | HR 61 | Ht 68.5 in | Wt 133.0 lb

## 2014-01-14 DIAGNOSIS — I25119 Atherosclerotic heart disease of native coronary artery with unspecified angina pectoris: Secondary | ICD-10-CM

## 2014-01-14 DIAGNOSIS — Z79899 Other long term (current) drug therapy: Secondary | ICD-10-CM

## 2014-01-14 DIAGNOSIS — I10 Essential (primary) hypertension: Secondary | ICD-10-CM | POA: Insufficient documentation

## 2014-01-14 DIAGNOSIS — I739 Peripheral vascular disease, unspecified: Secondary | ICD-10-CM

## 2014-01-14 DIAGNOSIS — E785 Hyperlipidemia, unspecified: Secondary | ICD-10-CM

## 2014-01-14 DIAGNOSIS — I2510 Atherosclerotic heart disease of native coronary artery without angina pectoris: Secondary | ICD-10-CM

## 2014-01-14 DIAGNOSIS — I209 Angina pectoris, unspecified: Secondary | ICD-10-CM

## 2014-01-14 NOTE — Assessment & Plan Note (Signed)
History of CAD status post RCA stenting in the past. 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 His last cath performed by myself 09/23/08 revealed a patent long RCA stent with 30-40% in-stent restenosis it and put it in the distal edge of the stent with a 50% stenosis beyond this in the native vessel unchanged from prior cath. EF was normal. He denies chest pain or shortness of breath

## 2014-01-14 NOTE — Assessment & Plan Note (Signed)
Controlled on current medications 

## 2014-01-14 NOTE — Assessment & Plan Note (Signed)
Not on statin therapy. We will recheck a lipid and liver profile.

## 2014-01-14 NOTE — Progress Notes (Signed)
01/14/2014 Sean Rose   11-01-1955  [id number]  Primary Physician Unice Cobble, MD Primary Cardiologist: Lorretta Harp MD [name]   HPI:  The patient is a 58-year-old thin appearing married Caucasian male father of 2 who I last saw in the office August 13, 2011. He has a history of RCA stenting in the past with bare metal stents. He has had in-stent restenosis with re-dilatation approximately 4 years ago. He has also had remote right common iliac artery PTA and stenting back in 2007. His other problems include hypertension, hyperlipidemia and continued tobacco abuse at three-fourths of a pack a day despite counseling to the contrary. He denies chest pain , shortness of breath or claudication.    Current Outpatient Prescriptions  Medication Sig Dispense Refill  . amLODipine (NORVASC) 5 MG tablet Take 1 tablet (5 mg total) by mouth daily. <must keep appointment 6/19>  30 tablet  0  . aspirin EC 81 MG tablet Take 81 mg by mouth daily.      . clopidogrel (PLAVIX) 75 MG tablet Take 1 tablet (75 mg total) by mouth daily.  15 tablet  0  . diphenhydrAMINE (BENADRYL) 25 MG tablet Take 25 mg by mouth every 6 (six) hours as needed for allergies.      . fexofenadine (ALLEGRA) 180 MG tablet Take 180 mg by mouth daily.      . metoprolol succinate (TOPROL-XL) 100 MG 24 hr tablet Take 1 tablet (100 mg total) by mouth daily. <must keep appointment 6/19>  30 tablet  0  . Multiple Vitamin (MULTIVITAMIN WITH MINERALS) TABS tablet Take 1 tablet by mouth daily.      . nitroGLYCERIN (NITROSTAT) 0.4 MG SL tablet Place 1 tablet (0.4 mg total) under the tongue every 5 (five) minutes as needed for chest pain.  25 tablet  0  . omeprazole (PRILOSEC) 20 MG capsule Take 20 mg by mouth daily.      . valsartan (DIOVAN) 160 MG tablet Take 1 tablet (160 mg total) by mouth daily. <must keep appointment 6/19>  30 tablet  0   No current facility-administered medications for this visit.     Allergies  Allergen Reactions  . Tylox [Oxycodone-Acetaminophen] Nausea Only    History   Social History  . Marital Status: Married    Spouse Name: N/A    Number of Children: N/A  . Years of Education: N/A   Occupational History  . Not on file.   Social History Main Topics  . Smoking status: Current Every Day Smoker -- 1.00 packs/day    Types: Cigarettes  . Smokeless tobacco: Not on file     Comment: smoked age 59-present , up to 2 ppd. 12/24/12 < 1 ppd  . Alcohol Use: 21.0 oz/week    35 Cans of beer per week     Comment:  14 drinks/ week  . Drug Use: No  . Sexual Activity: Not on file   Other Topics Concern  . Not on file   Social History Narrative  . No narrative on file     Review of Systems: General: negative for chills, fever, night sweats or weight changes.  Cardiovascular: negative for chest pain, dyspnea on exertion, edema, orthopnea, palpitations, paroxysmal nocturnal dyspnea or shortness of breath Dermatological: negative for rash Respiratory: negative for cough or wheezing Urologic: negative for hematuria Abdominal: negative for nausea, vomiting, diarrhea, bright red blood per rectum, melena, or hematemesis Neurologic: negative for visual changes, syncope, or dizziness All  other systems reviewed and are otherwise negative except as noted above.    Blood pressure 148/88, pulse 61, height 5' 8.5" (1.74 m), weight 133 lb (60.328 kg).  General appearance: alert and no distress Neck: no adenopathy, no carotid bruit, no JVD, supple, symmetrical, trachea midline and thyroid not enlarged, symmetric, no tenderness/mass/nodules Lungs: clear to auscultation bilaterally Heart: regular rate and rhythm, S1, S2 normal, no murmur, click, rub or gallop Extremities: extremities normal, atraumatic, no cyanosis or edema and 2+ pedal pulses  EKG dermal sinus rhythm at 61 without ST or T wave changes  ASSESSMENT AND PLAN:   PVD (peripheral vascular disease) History  of right common iliac artery stent 04/04/06. His left great catheterization demonstrating his abdominal aortogram performed/20/08 revealed a patent stent with 50% ostial left common iliac artery stenosis. His left lower extremity Doppler studies performed 3 years ago revealed ABIs of 1 bilaterally with a patent stent. He denies claudication.  Hyperlipidemia Not on statin therapy. We will recheck a lipid and liver profile.  Essential hypertension Controlled on current medications  Coronary atherosclerosis of native coronary artery History of CAD status post RCA stenting in the past. 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 His last cath performed by myself 09/23/08 revealed a patent long RCA stent with 30-40% in-stent restenosis it and put it in the distal edge of the stent with a 50% stenosis beyond this in the native vessel unchanged from prior cath. EF was normal. He denies chest pain or shortness of breath      Lorretta Harp MD [facility], [facility] 01/14/2014 2:12 PM

## 2014-01-14 NOTE — Assessment & Plan Note (Signed)
History of right common iliac artery stent 04/04/06. His left great catheterization demonstrating his abdominal aortogram performed/20/08 revealed a patent stent with 50% ostial left common iliac artery stenosis. His left lower extremity Doppler studies performed 3 years ago revealed ABIs of 1 bilaterally with a patent stent. He denies claudication.

## 2014-01-14 NOTE — Patient Instructions (Signed)
Your physician recommends that you return for lab work FASTING  Your physician recommends that you schedule a follow-up appointment in: ONE YEAR with Dr.Berry

## 2014-02-01 ENCOUNTER — Other Ambulatory Visit: Payer: Self-pay | Admitting: Cardiovascular Disease

## 2014-02-01 NOTE — Telephone Encounter (Signed)
Rx was sent to pharmacy electronically. 

## 2014-02-03 ENCOUNTER — Other Ambulatory Visit: Payer: Self-pay | Admitting: *Deleted

## 2014-02-03 MED ORDER — CLOPIDOGREL BISULFATE 75 MG PO TABS: 75.0000 mg | ORAL_TABLET | Freq: Every day | ORAL | Status: DC

## 2014-02-15 ENCOUNTER — Other Ambulatory Visit: Payer: Self-pay

## 2014-02-15 MED ORDER — NITROGLYCERIN 0.4 MG SL SUBL: 0.4000 mg | SUBLINGUAL_TABLET | SUBLINGUAL | Status: DC | PRN

## 2014-02-15 NOTE — Telephone Encounter (Signed)
Rx was sent to pharmacy electronically. 

## 2014-02-21 ENCOUNTER — Other Ambulatory Visit (INDEPENDENT_AMBULATORY_CARE_PROVIDER_SITE_OTHER): Payer: Self-pay | Admitting: Surgery

## 2014-03-22 ENCOUNTER — Encounter (INDEPENDENT_AMBULATORY_CARE_PROVIDER_SITE_OTHER): Payer: Self-pay

## 2014-05-13 ENCOUNTER — Other Ambulatory Visit: Payer: Self-pay

## 2014-12-23 ENCOUNTER — Other Ambulatory Visit: Payer: Self-pay | Admitting: Cardiovascular Disease

## 2015-01-23 ENCOUNTER — Other Ambulatory Visit: Payer: Self-pay | Admitting: Cardiovascular Disease

## 2015-01-23 ENCOUNTER — Other Ambulatory Visit: Payer: Self-pay

## 2015-01-23 MED ORDER — VALSARTAN 160 MG PO TABS: 160.0000 mg | ORAL_TABLET | Freq: Every day | ORAL | Status: DC

## 2015-02-07 ENCOUNTER — Encounter: Payer: Self-pay | Admitting: Cardiovascular Disease

## 2015-02-07 ENCOUNTER — Ambulatory Visit (INDEPENDENT_AMBULATORY_CARE_PROVIDER_SITE_OTHER): Payer: BLUE CROSS/BLUE SHIELD | Admitting: Cardiovascular Disease

## 2015-02-07 DIAGNOSIS — Z79899 Other long term (current) drug therapy: Secondary | ICD-10-CM

## 2015-02-07 DIAGNOSIS — E785 Hyperlipidemia, unspecified: Secondary | ICD-10-CM

## 2015-02-07 DIAGNOSIS — I739 Peripheral vascular disease, unspecified: Secondary | ICD-10-CM

## 2015-02-07 DIAGNOSIS — I10 Essential (primary) hypertension: Secondary | ICD-10-CM

## 2015-02-07 NOTE — Patient Instructions (Signed)
Medication Instructions:   Continue your current medications  Labwork:  A FASTING lipid profile: to be done at your convenience.  There is a Psychologist, forensic lab on the first floor of this building, [address].  They are open from 8am-5pm with a lunch from 12-2.  You do not need an appointment.     Testing/Procedures:  Lower extremity arterial doppler- During this test, ultrasound is used to evaluate arterial blood flow in the legs. Allow approximately one hour for this exam.     Follow-Up:  1 year with Dr Gwenlyn Found  Any Other Special Instructions Will Be Listed Below (If Applicable).

## 2015-02-07 NOTE — Assessment & Plan Note (Signed)
History of coronary artery disease status post RCA stenting in the past with bare metal stents. He had "in-stent restenosis with redilatation proximally 5 years ago. He denies chest pain or shortness of breath.

## 2015-02-07 NOTE — Assessment & Plan Note (Signed)
History of hypertension blood pressure measured at 146/70. He is on amlodipine, metoprolol and valsartan. Continue current meds at current dosing

## 2015-02-07 NOTE — Progress Notes (Signed)
02/07/2015 Sean Rose   [birth year]  [id number]  Primary Physician Unice Cobble, MD Primary Cardiologist: Lorretta Harp MD [name]   HPI:  The patient is a 59 year old thin appearing married Caucasian male father of 2 who I last saw in the office 01/14/14. He has a history of RCA stenting in the past with bare metal stents. He has had in-stent restenosis with re-dilatation approximately 4 years ago. He has also had remote right common iliac artery PTA and stenting back in 2007. His other problems include hypertension, hyperlipidemia and continued tobacco abuse at 1-1-1/2 packs a day despite counseling to the contrary. He denies chest pain , shortness of breath . He does complain of some right hip pain/claudication.   Current Outpatient Prescriptions  Medication Sig Dispense Refill  . amLODipine (NORVASC) 5 MG tablet take 1 tablet by mouth once daily 30 tablet 1  . aspirin EC 81 MG tablet Take 81 mg by mouth daily.    . clopidogrel (PLAVIX) 75 MG tablet take 1 tablet by mouth once daily 30 tablet 1  . diphenhydrAMINE (BENADRYL) 25 MG tablet Take 25 mg by mouth every 6 (six) hours as needed for allergies.    Marland Kitchen esomeprazole (NEXIUM) 40 MG capsule Take 40 mg by mouth.    . fexofenadine (ALLEGRA) 180 MG tablet Take 180 mg by mouth daily.    . metoprolol succinate (TOPROL-XL) 100 MG 24 hr tablet take 1 tablet by mouth once daily 30 tablet 1  . Multiple Vitamin (MULTIVITAMIN WITH MINERALS) TABS tablet Take 1 tablet by mouth daily.    . nitroGLYCERIN (NITROSTAT) 0.4 MG SL tablet Place 1 tablet (0.4 mg total) under the tongue every 5 (five) minutes as needed for chest pain. 25 tablet 5  . valsartan (DIOVAN) 160 MG tablet Take 1 tablet (160 mg total) by mouth daily. 30 tablet 1   No current facility-administered medications for this visit.    Allergies  Allergen Reactions  . Tylox [Oxycodone-Acetaminophen] Nausea Only    History   Social History  . Marital Status:  Married    Spouse Name: N/A  . Number of Children: N/A  . Years of Education: N/A   Occupational History  . Not on file.   Social History Main Topics  . Smoking status: Current Every Day Smoker -- 1.00 packs/day    Types: Cigarettes  . Smokeless tobacco: Not on file     Comment: smoked age 87-present , up to 2 ppd. 12/24/12 < 1 ppd  . Alcohol Use: 21.0 oz/week    35 Cans of beer per week     Comment:  14 drinks/ week  . Drug Use: No  . Sexual Activity: Not on file   Other Topics Concern  . Not on file   Social History Narrative     Review of Systems: General: negative for chills, fever, night sweats or weight changes.  Cardiovascular: negative for chest pain, dyspnea on exertion, edema, orthopnea, palpitations, paroxysmal nocturnal dyspnea or shortness of breath Dermatological: negative for rash Respiratory: negative for cough or wheezing Urologic: negative for hematuria Abdominal: negative for nausea, vomiting, diarrhea, bright red blood per rectum, melena, or hematemesis Neurologic: negative for visual changes, syncope, or dizziness All other systems reviewed and are otherwise negative except as noted above.    Blood pressure 146/70, pulse 60, height '5\' 8"'$  (1.727 m), weight 132 lb (59.875 kg).  General appearance: alert and no distress Neck: no adenopathy, no carotid bruit, no JVD,  supple, symmetrical, trachea midline and thyroid not enlarged, symmetric, no tenderness/mass/nodules Lungs: clear to auscultation bilaterally Heart: regular rate and rhythm, S1, S2 normal, no murmur, click, rub or gallop Extremities: extremities normal, atraumatic, no cyanosis or edema  EKG sinus rhythm at 60 without ST or T-wave changes. I personally reviewed this EKG  ASSESSMENT AND PLAN:   PVD (peripheral vascular disease) History of peripheral arterial disease status post right common iliac artery stenting in 2007. He does complain of some right hip claudication. He has a 2+ right  common femoral pulse without bruit. I am going to get lower extremity arterial Doppler studies.  Hyperlipidemia History of hyperlipidemia currently not on a statin drug. We will recheck a lipid and liver profile  Essential hypertension History of hypertension blood pressure measured at 146/70. He is on amlodipine, metoprolol and valsartan. Continue current meds at current dosing  Coronary atherosclerosis of native coronary artery History of coronary artery disease status post RCA stenting in the past with bare metal stents. He had "in-stent restenosis with redilatation proximally 5 years ago. He denies chest pain or shortness of breath.      Lorretta Harp MD FACP,FACC,FAHA, [facility] 02/07/2015 12:22 PM

## 2015-02-07 NOTE — Assessment & Plan Note (Signed)
History of hyperlipidemia currently not on a statin drug. We will recheck a lipid and liver profile

## 2015-02-07 NOTE — Assessment & Plan Note (Signed)
History of peripheral arterial disease status post right common iliac artery stenting in 2007. He does complain of some right hip claudication. He has a 2+ right common femoral pulse without bruit. I am going to get lower extremity arterial Doppler studies.

## 2015-02-08 ENCOUNTER — Other Ambulatory Visit: Payer: Self-pay | Admitting: Cardiovascular Disease

## 2015-02-08 DIAGNOSIS — I739 Peripheral vascular disease, unspecified: Secondary | ICD-10-CM

## 2015-03-16 ENCOUNTER — Ambulatory Visit (HOSPITAL_COMMUNITY)
Admission: RE | Admit: 2015-03-16 | Discharge: 2015-03-16 | Disposition: A | Discharge status: Home / Self Care | Payer: BLUE CROSS/BLUE SHIELD | Source: Ambulatory Visit | Attending: Cardiology | Admitting: Cardiology

## 2015-03-16 DIAGNOSIS — I70203 Unspecified atherosclerosis of native arteries of extremities, bilateral legs: Secondary | ICD-10-CM | POA: Insufficient documentation

## 2015-03-16 DIAGNOSIS — I739 Peripheral vascular disease, unspecified: Secondary | ICD-10-CM | POA: Insufficient documentation

## 2015-03-20 ENCOUNTER — Telehealth: Payer: Self-pay | Admitting: Cardiovascular Disease

## 2015-03-20 DIAGNOSIS — I739 Peripheral vascular disease, unspecified: Secondary | ICD-10-CM

## 2015-03-20 NOTE — Telephone Encounter (Signed)
Pt would like his doppler results from 03-16-15 please.

## 2015-03-21 NOTE — Telephone Encounter (Signed)
FORWARD TO Brita Romp RN

## 2015-03-22 NOTE — Telephone Encounter (Signed)
I reviewed doppler results with patient.

## 2015-03-24 ENCOUNTER — Other Ambulatory Visit: Payer: Self-pay

## 2015-03-24 ENCOUNTER — Other Ambulatory Visit: Payer: Self-pay | Admitting: Cardiovascular Disease

## 2015-03-24 MED ORDER — VALSARTAN 160 MG PO TABS: 160.0000 mg | ORAL_TABLET | Freq: Every day | ORAL | Status: DC

## 2015-03-24 NOTE — Telephone Encounter (Signed)
Rx(s) sent to pharmacy electronically.  

## 2015-03-25 ENCOUNTER — Other Ambulatory Visit: Payer: Self-pay | Admitting: Cardiovascular Disease

## 2015-03-27 ENCOUNTER — Other Ambulatory Visit: Payer: Self-pay | Admitting: *Deleted

## 2015-03-27 MED ORDER — VALSARTAN 160 MG PO TABS: 160.0000 mg | ORAL_TABLET | Freq: Every day | ORAL | Status: DC

## 2015-03-27 NOTE — Telephone Encounter (Signed)
REFILL 

## 2015-03-28 ENCOUNTER — Ambulatory Visit (HOSPITAL_COMMUNITY)
Admission: RE | Admit: 2015-03-28 | Discharge: 2015-03-28 | Disposition: A | Discharge status: Home / Self Care | Payer: BLUE CROSS/BLUE SHIELD | Source: Ambulatory Visit | Attending: Cardiovascular Disease | Admitting: Cardiovascular Disease

## 2015-03-28 DIAGNOSIS — I739 Peripheral vascular disease, unspecified: Secondary | ICD-10-CM | POA: Diagnosis not present

## 2015-03-28 DIAGNOSIS — I10 Essential (primary) hypertension: Secondary | ICD-10-CM | POA: Diagnosis not present

## 2015-03-28 DIAGNOSIS — E785 Hyperlipidemia, unspecified: Secondary | ICD-10-CM | POA: Insufficient documentation

## 2015-04-04 ENCOUNTER — Encounter: Payer: Self-pay | Admitting: Cardiovascular Disease

## 2015-04-04 ENCOUNTER — Ambulatory Visit (INDEPENDENT_AMBULATORY_CARE_PROVIDER_SITE_OTHER): Payer: BLUE CROSS/BLUE SHIELD | Admitting: Cardiovascular Disease

## 2015-04-04 VITALS — BP 142/76 | HR 61 | Ht 68.0 in | Wt 130.0 lb

## 2015-04-04 DIAGNOSIS — I739 Peripheral vascular disease, unspecified: Secondary | ICD-10-CM

## 2015-04-04 DIAGNOSIS — R5383 Other fatigue: Secondary | ICD-10-CM | POA: Diagnosis not present

## 2015-04-04 DIAGNOSIS — D689 Coagulation defect, unspecified: Secondary | ICD-10-CM | POA: Diagnosis not present

## 2015-04-04 DIAGNOSIS — Z79899 Other long term (current) drug therapy: Secondary | ICD-10-CM | POA: Diagnosis not present

## 2015-04-04 DIAGNOSIS — Z720 Tobacco use: Secondary | ICD-10-CM

## 2015-04-04 NOTE — Patient Instructions (Addendum)
Your physician wants you to follow-up in: 6 months with Dr Gwenlyn Found. You will receive a reminder letter in the mail two months in advance. If you don't receive a letter, please call our office to schedule the follow-up appointment.   Dr. Gwenlyn Found has ordered a peripheral angiogram to be done at [facility].  This procedure is going to look at the bloodflow in your lower extremities.  If Dr. Gwenlyn Found is able to open up the arteries, you will have to spend one night in the hospital.  If he is not able to open the arteries, you will be able to go home that same day.    After the procedure, you will not be allowed to drive for 3 days or push, pull, or lift anything greater than 10 lbs for one week.    You will be required to have the following tests prior to the procedure:  1. Blood work-the blood work can be done no more than 7 days prior to the procedure.  It can be done at any [facility] lab.  There is one downstairs on the first floor of this building and one in the [facility] building [phone] [address])  2. Chest Xray-the chest xray order has already been placed at the [facility].     *REPS n/a

## 2015-04-04 NOTE — Progress Notes (Signed)
Mr. Ainsley has a history of right common iliac artery stenting back in 2007. He has been complaining of right hip claudication. Recent Dopplers performed 03/16/15 revealed mild disease on the right and moderate disease on the left. He is symptomatic. He is going to think about whether to proceed with angiography and potential intervention.   Lorretta Harp, M.D., [place], [facility], [place] [address]. [place], [place]  [address]  [phone] 04/04/2015 2:22 PM

## 2015-04-04 NOTE — Assessment & Plan Note (Signed)
Sean Rose has a history of right common iliac artery stenting back in 2007. He has been complaining of right hip claudication. Recent Dopplers performed 03/16/15 revealed mild disease on the right and moderate disease on the left. He is symptomatic. He is going to think about whether to proceed with angiography and potential intervention.

## 2015-04-05 ENCOUNTER — Ambulatory Visit
Admission: RE | Admit: 2015-04-05 | Discharge: 2015-04-05 | Disposition: A | Discharge status: Home / Self Care | Payer: BLUE CROSS/BLUE SHIELD | Source: Ambulatory Visit | Attending: Cardiovascular Disease | Admitting: Cardiovascular Disease

## 2015-04-05 DIAGNOSIS — Z720 Tobacco use: Secondary | ICD-10-CM

## 2015-04-05 LAB — BASIC METABOLIC PANEL
BUN: 3 mg/dL — AB (ref 7–25)
CHLORIDE: 95 mmol/L — AB (ref 98–110)
CO2: 27 mmol/L (ref 20–31)
CREATININE: 0.57 mg/dL — AB (ref 0.70–1.33)
Calcium: 8.6 mg/dL (ref 8.6–10.3)
Glucose, Bld: 136 mg/dL — ABNORMAL HIGH (ref 65–99)
POTASSIUM: 3.5 mmol/L (ref 3.5–5.3)
Sodium: 130 mmol/L — ABNORMAL LOW (ref 135–146)

## 2015-04-05 LAB — CBC
HEMATOCRIT: 41.7 % (ref 39.0–52.0)
Hemoglobin: 14.4 g/dL (ref 13.0–17.0)
MCH: 35.6 pg — ABNORMAL HIGH (ref 26.0–34.0)
MCHC: 34.5 g/dL (ref 30.0–36.0)
MCV: 103.2 fL — AB (ref 78.0–100.0)
MPV: 8.3 fL — AB (ref 8.6–12.4)
PLATELETS: 300 10*3/uL (ref 150–400)
RBC: 4.04 MIL/uL — AB (ref 4.22–5.81)
RDW: 13.3 % (ref 11.5–15.5)
WBC: 7.4 10*3/uL (ref 4.0–10.5)

## 2015-04-05 LAB — TSH: TSH: 1.214 u[IU]/mL (ref 0.350–4.500)

## 2015-04-06 LAB — PROTIME-INR
INR: 0.94 (ref <1.50)
Prothrombin Time: 12.7 seconds (ref 11.6–15.2)

## 2015-04-06 LAB — APTT: APTT: 34 s (ref 24–37)

## 2015-04-07 ENCOUNTER — Encounter: Payer: Self-pay | Admitting: Cardiovascular Disease

## 2015-04-13 ENCOUNTER — Ambulatory Visit (HOSPITAL_COMMUNITY)
Admission: RE | Admit: 2015-04-13 | Discharge: 2015-04-14 | Disposition: A | Discharge status: Home / Self Care | Payer: BLUE CROSS/BLUE SHIELD | Source: Ambulatory Visit | Attending: Cardiovascular Disease | Admitting: Cardiovascular Disease

## 2015-04-13 ENCOUNTER — Encounter (HOSPITAL_COMMUNITY)
Admission: RE | Disposition: A | Discharge status: Home / Self Care | Payer: Self-pay | Source: Ambulatory Visit | Attending: Cardiovascular Disease

## 2015-04-13 ENCOUNTER — Encounter (HOSPITAL_COMMUNITY): Payer: Self-pay | Admitting: *Deleted

## 2015-04-13 DIAGNOSIS — Z23 Encounter for immunization: Secondary | ICD-10-CM | POA: Insufficient documentation

## 2015-04-13 DIAGNOSIS — I10 Essential (primary) hypertension: Secondary | ICD-10-CM | POA: Insufficient documentation

## 2015-04-13 DIAGNOSIS — I2510 Atherosclerotic heart disease of native coronary artery without angina pectoris: Secondary | ICD-10-CM | POA: Insufficient documentation

## 2015-04-13 DIAGNOSIS — E876 Hypokalemia: Secondary | ICD-10-CM | POA: Insufficient documentation

## 2015-04-13 DIAGNOSIS — I70213 Atherosclerosis of native arteries of extremities with intermittent claudication, bilateral legs: Secondary | ICD-10-CM | POA: Diagnosis not present

## 2015-04-13 DIAGNOSIS — I70212 Atherosclerosis of native arteries of extremities with intermittent claudication, left leg: Secondary | ICD-10-CM | POA: Insufficient documentation

## 2015-04-13 DIAGNOSIS — R5383 Other fatigue: Secondary | ICD-10-CM

## 2015-04-13 DIAGNOSIS — Z720 Tobacco use: Secondary | ICD-10-CM

## 2015-04-13 DIAGNOSIS — D689 Coagulation defect, unspecified: Secondary | ICD-10-CM

## 2015-04-13 DIAGNOSIS — I739 Peripheral vascular disease, unspecified: Secondary | ICD-10-CM | POA: Diagnosis present

## 2015-04-13 DIAGNOSIS — Z79899 Other long term (current) drug therapy: Secondary | ICD-10-CM

## 2015-04-13 HISTORY — PX: PERIPHERAL VASCULAR CATHETERIZATION: SHX172C

## 2015-04-13 LAB — POCT ACTIVATED CLOTTING TIME
ACTIVATED CLOTTING TIME: 257 s
Activated Clotting Time: 147 seconds

## 2015-04-13 SURGERY — LOWER EXTREMITY ANGIOGRAPHY
Anesthesia: LOCAL

## 2015-04-13 MED ORDER — SODIUM CHLORIDE 0.9 % IV SOLN: INTRAVENOUS | Status: AC

## 2015-04-13 MED ORDER — PANTOPRAZOLE SODIUM 40 MG PO TBEC
40.0000 mg | DELAYED_RELEASE_TABLET | Freq: Every day | ORAL | Status: DC
  Filled 2015-04-13: qty 1

## 2015-04-13 MED ORDER — MIDAZOLAM HCL 2 MG/2ML IJ SOLN
INTRAMUSCULAR | Status: AC
  Filled 2015-04-13: qty 4

## 2015-04-13 MED ORDER — IRBESARTAN 75 MG PO TABS
150.0000 mg | ORAL_TABLET | Freq: Every day | ORAL | Status: DC
  Filled 2015-04-13: qty 2

## 2015-04-13 MED ORDER — METOPROLOL SUCCINATE ER 50 MG PO TB24
100.0000 mg | ORAL_TABLET | Freq: Every day | ORAL | Status: DC
  Filled 2015-04-13: qty 2

## 2015-04-13 MED ORDER — FENTANYL CITRATE (PF) 100 MCG/2ML IJ SOLN
INTRAMUSCULAR | Status: DC | PRN
  Administered 2015-04-13: 25 ug via INTRAVENOUS

## 2015-04-13 MED ORDER — SODIUM CHLORIDE 0.9 % WEIGHT BASED INFUSION: 1.0000 mL/kg/h | INTRAVENOUS | Status: DC

## 2015-04-13 MED ORDER — ASPIRIN 81 MG PO CHEW: 81.0000 mg | CHEWABLE_TABLET | ORAL | Status: DC

## 2015-04-13 MED ORDER — LIDOCAINE HCL (PF) 1 % IJ SOLN
INTRAMUSCULAR | Status: DC | PRN
Start: 2015-04-13 — End: 2015-04-13
  Administered 2015-04-13 (×2): 15 mL

## 2015-04-13 MED ORDER — ONDANSETRON HCL 4 MG/2ML IJ SOLN: 4.0000 mg | Freq: Four times a day (QID) | INTRAMUSCULAR | Status: DC | PRN

## 2015-04-13 MED ORDER — NITROGLYCERIN 0.4 MG SL SUBL: 0.4000 mg | SUBLINGUAL_TABLET | SUBLINGUAL | Status: DC | PRN

## 2015-04-13 MED ORDER — FENTANYL CITRATE (PF) 100 MCG/2ML IJ SOLN
INTRAMUSCULAR | Status: AC
  Filled 2015-04-13: qty 4

## 2015-04-13 MED ORDER — HEPARIN (PORCINE) IN NACL 2-0.9 UNIT/ML-% IJ SOLN
INTRAMUSCULAR | Status: AC
  Filled 2015-04-13: qty 1000

## 2015-04-13 MED ORDER — CLOPIDOGREL BISULFATE 75 MG PO TABS: 75.0000 mg | ORAL_TABLET | Freq: Every day | ORAL | Status: DC

## 2015-04-13 MED ORDER — SODIUM CHLORIDE 0.9 % IJ SOLN: 3.0000 mL | INTRAMUSCULAR | Status: DC | PRN

## 2015-04-13 MED ORDER — NITROGLYCERIN 1 MG/10 ML FOR IR/CATH LAB
INTRA_ARTERIAL | Status: DC | PRN
  Administered 2015-04-13: 200 ug via INTRA_ARTERIAL

## 2015-04-13 MED ORDER — ASPIRIN EC 325 MG PO TBEC
325.0000 mg | DELAYED_RELEASE_TABLET | Freq: Every day | ORAL | Status: DC
  Filled 2015-04-13: qty 1

## 2015-04-13 MED ORDER — SODIUM CHLORIDE 0.9 % WEIGHT BASED INFUSION
3.0000 mL/kg/h | INTRAVENOUS | Status: DC
  Administered 2015-04-13: 3 mL/kg/h via INTRAVENOUS

## 2015-04-13 MED ORDER — TRAZODONE HCL 50 MG PO TABS
25.0000 mg | ORAL_TABLET | Freq: Once | ORAL | Status: AC
  Administered 2015-04-13: 25 mg via ORAL
  Filled 2015-04-13: qty 1

## 2015-04-13 MED ORDER — PNEUMOCOCCAL VAC POLYVALENT 25 MCG/0.5ML IJ INJ
0.5000 mL | INJECTION | INTRAMUSCULAR | Status: DC
  Filled 2015-04-13: qty 0.5

## 2015-04-13 MED ORDER — IODIXANOL 320 MG/ML IV SOLN
INTRAVENOUS | Status: DC | PRN
  Administered 2015-04-13: 100 mL via INTRA_ARTERIAL

## 2015-04-13 MED ORDER — ACETAMINOPHEN 325 MG PO TABS: 650.0000 mg | ORAL_TABLET | ORAL | Status: DC | PRN

## 2015-04-13 MED ORDER — NITROGLYCERIN 1 MG/10 ML FOR IR/CATH LAB
INTRA_ARTERIAL | Status: AC
  Filled 2015-04-13: qty 10

## 2015-04-13 MED ORDER — HEPARIN SODIUM (PORCINE) 1000 UNIT/ML IJ SOLN
INTRAMUSCULAR | Status: AC
  Filled 2015-04-13: qty 1

## 2015-04-13 MED ORDER — ASPIRIN EC 81 MG PO TBEC: 81.0000 mg | DELAYED_RELEASE_TABLET | Freq: Every day | ORAL | Status: DC

## 2015-04-13 MED ORDER — MORPHINE SULFATE (PF) 2 MG/ML IV SOLN: 2.0000 mg | INTRAVENOUS | Status: DC | PRN

## 2015-04-13 MED ORDER — LIDOCAINE HCL (PF) 1 % IJ SOLN
INTRAMUSCULAR | Status: AC
  Filled 2015-04-13: qty 30

## 2015-04-13 MED ORDER — AMLODIPINE BESYLATE 5 MG PO TABS
5.0000 mg | ORAL_TABLET | Freq: Every day | ORAL | Status: DC
  Filled 2015-04-13: qty 1

## 2015-04-13 MED ORDER — CLOPIDOGREL BISULFATE 75 MG PO TABS
75.0000 mg | ORAL_TABLET | Freq: Every day | ORAL | Status: DC
  Filled 2015-04-13: qty 1

## 2015-04-13 MED ORDER — MIDAZOLAM HCL 2 MG/2ML IJ SOLN
INTRAMUSCULAR | Status: DC | PRN
  Administered 2015-04-13: 1 mg via INTRAVENOUS

## 2015-04-13 MED ORDER — INFLUENZA VAC SPLIT QUAD 0.5 ML IM SUSY
0.5000 mL | PREFILLED_SYRINGE | INTRAMUSCULAR | Status: DC
  Filled 2015-04-13: qty 0.5

## 2015-04-13 MED ORDER — LORATADINE 10 MG PO TABS
10.0000 mg | ORAL_TABLET | Freq: Every day | ORAL | Status: DC
  Filled 2015-04-13: qty 1

## 2015-04-13 MED ORDER — HEPARIN SODIUM (PORCINE) 1000 UNIT/ML IJ SOLN
INTRAMUSCULAR | Status: DC | PRN
  Administered 2015-04-13: 5000 [IU] via INTRAVENOUS

## 2015-04-13 MED ORDER — HEPARIN (PORCINE) IN NACL 2-0.9 UNIT/ML-% IJ SOLN
INTRAMUSCULAR | Status: DC | PRN
  Administered 2015-04-13: 750 mL

## 2015-04-13 SURGICAL SUPPLY — 19 items
BALLOON POWERFLX PRO 5X20X80 (BALLOONS) ×1 IMPLANT
BALLOON POWERFLX PRO 7X20X135 (BALLOONS) ×1 IMPLANT
CATH ANGIO 5F PIGTAIL 65CM (CATHETERS) ×1 IMPLANT
CATH STRAIGHT 5FR 65CM (CATHETERS) ×1 IMPLANT
KIT ENCORE 26 ADVANTAGE (KITS) ×2 IMPLANT
KIT PV (KITS) ×2 IMPLANT
SHEATH BRITE TIP 7FR 35CM (SHEATH) ×1 IMPLANT
SHEATH PINNACLE 5F 10CM (SHEATH) ×1 IMPLANT
SHEATH PINNACLE 7F 10CM (SHEATH) ×1 IMPLANT
SHEATH PINNACLE 8F 10CM (SHEATH) ×1 IMPLANT
STENT ICAST 8X38X120 (Permanent Stent) ×1 IMPLANT
STOPCOCK MORSE 400PSI 3WAY (MISCELLANEOUS) ×2 IMPLANT
SYRINGE MEDRAD AVANTA MACH 7 (SYRINGE) ×1 IMPLANT
TRANSDUCER W/STOPCOCK (MISCELLANEOUS) ×2 IMPLANT
TRAY PV CATH (CUSTOM PROCEDURE TRAY) ×2 IMPLANT
WIRE HI TORQ VERSACORE J 260CM (WIRE) ×1 IMPLANT
WIRE HITORQ VERSACORE ST 145CM (WIRE) ×1 IMPLANT
WIRE J 3MM .035X145CM (WIRE) ×1 IMPLANT
WIRE VERSACORE LOC 115CM (WIRE) ×1 IMPLANT

## 2015-04-13 NOTE — Progress Notes (Signed)
Site area: left groin  Site Prior to Removal:  Level 0  Pressure Applied For 20 MINUTES    Minutes Beginning at 19:35  Manual:   Yes.    Patient Status During Pull:  WNL  Post Pull Groin Site:  Level 0  Post Pull Instructions Given:  Yes.    Post Pull Pulses Present:  Yes.    Dressing Applied:  Yes.    Comments:

## 2015-04-13 NOTE — Progress Notes (Signed)
Site area: right groin  Site Prior to Removal:  Level 0  Pressure Applied For 20 MINUTES    Minutes Beginning at 1850  Manual:   Yes.    Patient Status During Pull:  AAO X 4  Post Pull Groin Site:  Level 0  Post Pull Instructions Given:  Yes.    Post Pull Pulses Present:  Yes.    Dressing Applied:  Yes.    Comments:  Tolerated procedure well

## 2015-04-13 NOTE — H&P (View-Only) (Signed)
Sean Rose has a history of right common iliac artery stenting back in 2007. He has been complaining of right hip claudication. Recent Dopplers performed 03/16/15 revealed mild disease on the right and moderate disease on the left. He is symptomatic. He is going to think about whether to proceed with angiography and potential intervention.   Lorretta Harp, M.D., [place], [facility], [place] [address] [place], [place]  [address]  [phone] 04/04/2015 2:22 PM

## 2015-04-13 NOTE — Interval H&P Note (Signed)
History and Physical Interval Note:  04/13/2015 3:39 PM  Sean Rose  has presented today for surgery, with the diagnosis of pvd  The various methods of treatment have been discussed with the patient and family. After consideration of risks, benefits and other options for treatment, the patient has consented to  Procedure(s): Lower Extremity Angiography (N/A) as a surgical intervention .  The patient's history has been reviewed, patient examined, no change in status, stable for surgery.  I have reviewed the patient's chart and labs.  Questions were answered to the patient's satisfaction.     Quay Burow

## 2015-04-14 ENCOUNTER — Encounter (HOSPITAL_COMMUNITY): Payer: Self-pay | Admitting: Student

## 2015-04-14 ENCOUNTER — Other Ambulatory Visit: Payer: Self-pay | Admitting: Physician Assistant

## 2015-04-14 DIAGNOSIS — E876 Hypokalemia: Secondary | ICD-10-CM

## 2015-04-14 DIAGNOSIS — I2510 Atherosclerotic heart disease of native coronary artery without angina pectoris: Secondary | ICD-10-CM

## 2015-04-14 DIAGNOSIS — I739 Peripheral vascular disease, unspecified: Secondary | ICD-10-CM

## 2015-04-14 DIAGNOSIS — I10 Essential (primary) hypertension: Secondary | ICD-10-CM | POA: Diagnosis not present

## 2015-04-14 DIAGNOSIS — I70212 Atherosclerosis of native arteries of extremities with intermittent claudication, left leg: Secondary | ICD-10-CM | POA: Diagnosis not present

## 2015-04-14 LAB — MAGNESIUM
MAGNESIUM: 1.1 mg/dL — AB (ref 1.7–2.4)
MAGNESIUM: 2 mg/dL (ref 1.7–2.4)

## 2015-04-14 LAB — BASIC METABOLIC PANEL
ANION GAP: 7 (ref 5–15)
ANION GAP: 5 (ref 5–15)
BUN: <5 mg/dL — ABNORMAL LOW (ref 6–20)
BUN: <5 mg/dL — ABNORMAL LOW (ref 6–20)
CALCIUM: 8 mg/dL — AB (ref 8.9–10.3)
CO2: 25 mmol/L (ref 22–32)
CO2: 25 mmol/L (ref 22–32)
Calcium: 8 mg/dL — ABNORMAL LOW (ref 8.9–10.3)
Chloride: 98 mmol/L — ABNORMAL LOW (ref 101–111)
Chloride: 100 mmol/L — ABNORMAL LOW (ref 101–111)
Creatinine, Ser: 0.65 mg/dL (ref 0.61–1.24)
Creatinine, Ser: 0.62 mg/dL (ref 0.61–1.24)
GFR calc Af Amer: >60 mL/min (ref >60)
GLUCOSE: 179 mg/dL — AB (ref 65–99)
Glucose, Bld: 126 mg/dL — ABNORMAL HIGH (ref 65–99)
POTASSIUM: 2.3 mmol/L — AB (ref 3.5–5.1)
POTASSIUM: 3.4 mmol/L — AB (ref 3.5–5.1)
SODIUM: 130 mmol/L — AB (ref 135–145)
Sodium: 130 mmol/L — ABNORMAL LOW (ref 135–145)

## 2015-04-14 LAB — CBC
HEMATOCRIT: 36.7 % — AB (ref 39.0–52.0)
HEMOGLOBIN: 13.3 g/dL (ref 13.0–17.0)
MCH: 36.2 pg — ABNORMAL HIGH (ref 26.0–34.0)
MCHC: 36.2 g/dL — ABNORMAL HIGH (ref 30.0–36.0)
MCV: 100 fL (ref 78.0–100.0)
Platelets: 273 10*3/uL (ref 150–400)
RBC: 3.67 MIL/uL — AB (ref 4.22–5.81)
RDW: 12.9 % (ref 11.5–15.5)
WBC: 7.2 10*3/uL (ref 4.0–10.5)

## 2015-04-14 MED ORDER — POTASSIUM CHLORIDE CRYS ER 20 MEQ PO TBCR
20.0000 meq | EXTENDED_RELEASE_TABLET | Freq: Once | ORAL | Status: AC
  Administered 2015-04-14: 20 meq via ORAL
  Filled 2015-04-14: qty 2

## 2015-04-14 MED ORDER — POTASSIUM CHLORIDE CRYS ER 20 MEQ PO TBCR
40.0000 meq | EXTENDED_RELEASE_TABLET | ORAL | Status: AC
  Administered 2015-04-14 (×3): 40 meq via ORAL
  Filled 2015-04-14 (×3): qty 2

## 2015-04-14 MED ORDER — MAGNESIUM SULFATE 2 GM/50ML IV SOLN
2.0000 g | Freq: Once | INTRAVENOUS | Status: AC
  Administered 2015-04-14: 2 g via INTRAVENOUS
  Filled 2015-04-14: qty 50

## 2015-04-14 MED ORDER — POTASSIUM CHLORIDE IN NACL 20-0.9 MEQ/L-% IV SOLN
INTRAVENOUS | Status: AC
  Administered 2015-04-14: 06:00:00 via INTRAVENOUS
  Filled 2015-04-14: qty 1000

## 2015-04-14 NOTE — Discharge Instructions (Signed)

## 2015-04-14 NOTE — Discharge Summary (Signed)
CARDIOLOGY DISCHARGE SUMMARY   Patient ID: Sean Rose MRN: [id number] DOB/AGE: 01/07/1956 59 y.o.  Admit date: 04/13/2015 Discharge date: 04/14/2015  PCP: Unice Cobble, MD Primary Cardiologist: Dr. Gwenlyn Found  Primary Discharge Diagnosis:  Peripheral Vascular Disease, Claudication Secondary Discharge Diagnosis: Coronary atherosclerosis of native coronary artery, Essential hypertension, Hypokalemia, Hypomagnesemia  Consults: None  Procedures: Abdominal Aortogram, Bilateral iliac Angiogram, PTA and stent of left common iliac artery  Hospital Course: Sean Rose is a 59 y.o. male with past medical history of CAD (BMS to RCA in 2007 with in-stent restenosis during 2008), PVD (s/p R common iliac stent - 2007), HTN, HLD, and tobacco abuse who had chief complaint of right hip claudication when seen in the office by Dr. Gwenlyn Found on 02/07/2015. He obtained lower extremity dopplers on 03/16/2015 which showed diffuse aorto-iliac calcific plaque, bilaterally and greater than 50% stenosis of the common iliac arteries bilaterally with left side showing more than the right.  He was reexamined in the office by Dr. Gwenlyn Found on 04/04/2015 and it was decided the next best step would be to perform lower extremity angiography with possible intervention. The risks and benefits of the procedure were discussed in detail with the patient.   He presented to [facility] on [facility]13[facility]2015 for the procedure. Abdominal aortogram showed a small calcified abdominal aortic aneurysm. Left lower extremity angiogram showed 75% calcified ostial left common iliac artery stenosis and a 40-50% long segmental left external iliac artery stenosis without a pullback gradient. Right lower extremity angiography showed a widely patent ostial right common iliac artery stent. He underwent a successful ICast stenting of a high-grade calcified ostial hemodynamically significant left common iliac artery stenosis. It was recommended for the patient  to continue DAPT.   The patient was examined on the morning of 04/14/2015. He had minimal ecchymosis along the right groin with a small hematoma noted along the left groin that was without tenderness. His potassium was noted to be extremely low at 2.3. He underwent potassium repletion with repeat value of 3.4. An additional tablet of potassium chloride 22mq was ordered prior to discharge. His potassium appears to chronically be slightly low and he was encouraged to consume potassium rich foods with specific examples given. His Magnesium was also low at 1.1 and repletion was provided with a repeat value of 2.0. A repeat Magnesium and BMET should be obtained at his outpatient follow-up appointment.  The patient was last examined by Dr. JMartiniqueand deemed stable for discharge.  Follow-up arterial Dopplers [facility] the [place] [place] and hosptial follow-up with a provider in 2-3 weeks has been arranged.  Labs:   Lab Results  Component Value Date   WBC 7.2 04/14/2015   HGB 13.3 04/14/2015   HCT 36.7* 04/14/2015   MCV 100.0 04/14/2015   PLT 273 04/14/2015     Recent Labs Lab 04/14/15 1033  NA 130*  K 3.4*  CL 100*  CO2 25  BUN <5*  CREATININE 0.62  CALCIUM 8.0*  GLUCOSE 126*    Lower Extremity Angiography: 04/13/2015 Angiographic Data:  1: Abdominal aortogram-there was a small fluoroscopically calcified abdominal aortic aneurysm 2: Left lower extremity-75% calcified ostial left common iliac artery stenosis with a 40 mm pullback gradient using a 5 French endhole catheter after administration of 200 g of intra-arterial ventricle strain. There was a 40-50% long segmental left external iliac artery stenosis without a pullback gradient 3: Right lower extremity-widely patent ostial right common iliac artery stent  IMPRESSION:Sean Rose  a high-grade physiologically significant calcified ostial left common iliac artery stenosis. We'll proceed with PTA and stenting using an ICast covered  stent and kissing stent technique.  Procedure Description:a 7 French bright tip sheath was inserted into the left common femoral artery. The patient received 5000 units of heparin with an ACT of 257. Total contrast used was 100 mL administered to the patient. I put a 7 mm x 2 cm balloon across the patent right common iliac iliac artery stent. I then placed a 5 mm x 2 cm balloon across the ostium of the left common iliac artery over a 0.35 Versicore wire for predilatation. Following this I placed a 8 mm x 38 mm long I Covered stent at the origin of the left common iliac artery and deployed this simultaneously with blowing up the right iliac balloon. Final angiography result was reduction of a 75-80% ostial calcified left common iliac artery stenosis to 0% residual. The patient tolerated the procedure well. The sheath was then exchanged over a wire for a short 7 [place] sheath however because of oozing around the sheath,this was upgraded to a short 8 [place] sheath.  Final Impression: successful ICast covered stenting of a high-grade calcified ostial hemodynamically significant left common iliac artery stenosis for claudication. The sheaths were sewn securely in place. The patient left the laboratory table condition. They will be removed and pressure held on the groin to achieve hemostasis once the ACT falls below 170. The patient will continue dual and type of therapy and will be discharged home in the morning. He will get follow-up arterial Dopplers in our [facility] line office next week and I will see him back in 2-3 weeks for follow-up.   FOLLOW UP PLANS AND APPOINTMENTS Allergies  Allergen Reactions  . Tylox [Oxycodone-Acetaminophen] Nausea Only     Medication List    TAKE these medications        amLODipine 5 MG tablet  Commonly known as:  NORVASC  take 1 tablet by mouth once daily     aspirin EC 81 MG tablet  Take 81 mg by mouth daily.     clopidogrel 75 MG tablet  Commonly known as:  PLAVIX   take 1 tablet by mouth once daily     esomeprazole 40 MG capsule  Commonly known as:  NEXIUM  Take 40 mg by mouth daily.     fexofenadine 180 MG tablet  Commonly known as:  ALLEGRA  Take 180 mg by mouth daily.     metoprolol succinate 100 MG 24 hr tablet  Commonly known as:  TOPROL-XL  take 1 tablet by mouth once daily     multivitamin with minerals Tabs tablet  Take 1 tablet by mouth daily.     nitroGLYCERIN 0.4 MG SL tablet  Commonly known as:  NITROSTAT  Place 1 tablet (0.4 mg total) under the tongue every 5 (five) minutes as needed for chest pain.     valsartan 160 MG tablet  Commonly known as:  DIOVAN  Take 1 tablet (160 mg total) by mouth daily.         Follow-up Information    Follow up with [facility] On 05/03/2015.   Specialty:  Cardiology   Why:  9:00amRosaria Ferries and Dr. Pearla Dubonnet information:   [address] [place] [phone]      Follow up with [facility] On 04/19/2015.   Specialty:  Cardiology   Why:  1:30pm.  Office ultrasound   Contact information:   [place] TO FOLLOW UP APPOINTMENTS  Time spent with patient to include physician time: 40 minutes Signed: Erma Heritage, PA 04/14/2015, 11:40 AM Co-Sign MD  Patient seen and examined and history reviewed. Agree with above findings and plan. See earlier rounding note.   Peter Martinique, [place] 04/14/2015 12:41 PM

## 2015-04-14 NOTE — Progress Notes (Signed)
CRITICAL VALUE ALERT  Critical value received:  K=2.3  Date of notification:  04/13/15  Time of notification:  05:00 AM  Critical value read back:Yes.    Nurse who received alert:  Vita Erm RN  MD notified (1st page):  Dr. Colon Flattery  Time of first page:  05:05 AM  MD notified (2nd page):  Time of second page:  Responding MD:  Dr. Colon Flattery  Time MD responded:  05:15 AM

## 2015-04-14 NOTE — Progress Notes (Signed)
Patient Name: Sean Rose Date of Encounter: 04/14/2015  Active Problems:   PVD (peripheral vascular disease)   Claudication     Primary Cardiologist: Dr. Gwenlyn Rose  Patient Profile: 59 yo male w/ PMH of CAD (BMS to RCA - in-stent restenosis 2008), PVD (s/p R common iliac stent - 2007), HTN, HLD, and tobacco abuse who had chief complaint of right hip claudication on 04/04/2015 and underwent lower extremity angiography on 04/13/2015.   SUBJECTIVE: Feeling well. Denies any chest pain, shortness of breath, or palpitations. Small hematoma along left groin site but without tenderness.  OBJECTIVE Filed Vitals:   04/14/15 0000 04/14/15 0100 04/14/15 0156 04/14/15 0200  BP: 105/62 113/82 129/82 129/82  Pulse:   60   Temp:   98.1 F (36.7 C)   TempSrc:   Oral   Resp: '15 17 11 18  '$ Height:      Weight:   130 lb 8 oz (59.194 kg)   SpO2: 98% 98% 97% 98%    Intake/Output Summary (Last 24 hours) at 04/14/15 0646 Last data filed at 04/14/15 0158  Gross per 24 hour  Intake    925 ml  Output    900 ml  Net     25 ml   Filed Weights   04/13/15 1153 04/14/15 0156  Weight: 132 lb (59.875 kg) 130 lb 8 oz (59.194 kg)    PHYSICAL EXAM General: Well developed, well nourished, male in no acute distress. Head: Normocephalic, atraumatic.  Neck: Supple without bruits, JVD not elevated. Lungs:  Resp regular and unlabored, CTA without wheezing or rales. Heart: RRR, S1, S2, no S3, S4, or murmur; no rub. Abdomen: Soft, non-tender, non-distended with normoactive bowel sounds. No hepatomegaly. No rebound/guarding. No obvious abdominal masses. Extremities: No clubbing, cyanosis, or edema. Distal pedal pulses are 2+ bilaterally. Right groin site with minimal ecchymosis. Left groin site with small hematoma, without tenderness. Neuro: Alert and oriented X 3. Moves all extremities spontaneously. Psych: Normal affect.   LABS: CBC:  Recent Labs  04/14/15 0339  WBC 7.2  HGB 13.3  HCT 36.7*    MCV 100.0  PLT 161   Basic Metabolic Panel:  Recent Labs  04/14/15 0339  NA 130*  K 2.3*  CL 98*  CO2 25  GLUCOSE 179*  BUN <5*  CREATININE 0.65  CALCIUM 8.0*  MG 1.1*    TELE:  NSR with rate in 50's - 60's. Frequent PVC's.  Lower Extremity Angiography: 04/13/2015  Angiographic Data:  1: Abdominal aortogram-there was a small fluoroscopically calcified abdominal aortic aneurysm 2: Left lower extremity-75% calcified ostial left common iliac artery stenosis with a 40 mm pullback gradient using a 5 French endhole catheter after administration of 200 g of intra-arterial ventricle strain. There was a 40-50% long segmental left external iliac artery stenosis without a pullback gradient 3: Right lower extremity-widely patent ostial right common iliac artery stent  IMPRESSION:Mr. Sean Rose has a high-grade physiologically significant calcified ostial left common iliac artery stenosis. We'll proceed with PTA and stenting using an ICast covered stent and kissing stent technique.  Procedure Description:a 7 French bright tip sheath was inserted into the left common femoral artery. The patient received 5000 units of heparin with an ACT of 257. Total contrast used was 100 mL administered to the patient. I put a 7 mm x 2 cm balloon across the patent right common iliac iliac artery stent. I then placed a 5 mm x 2 cm balloon across the ostium of the left  common iliac artery over a 0.35 Versicore wire for predilatation. Following this I placed a 8 mm x 38 mm long I Covered stent at the origin of the left common iliac artery and deployed this simultaneously with blowing up the right iliac balloon. Final angiography result was reduction of a 75-80% ostial calcified left common iliac artery stenosis to 0% residual. The patient tolerated the procedure well. The sheath was then exchanged over a wire for a short 7 [place] sheath however because of oozing around the sheath,this was upgraded to a short 8 [place]  sheath.  Final Impression: successful ICast covered stenting of a high-grade calcified ostial hemodynamically significant left common iliac artery stenosis for claudication. The sheaths were sewn securely in place. The patient left the laboratory table condition. They will be removed and pressure held on the groin to achieve hemostasis once the ACT falls below 170. The patient will continue dual and type of therapy and will be discharged home in the morning. He will get follow-up arterial Dopplers in our [facility] line office next week and I will see him back in 2-3 weeks for follow-up.  Current Medications:  . amLODipine  5 mg Oral Daily  . aspirin EC  325 mg Oral Daily  . clopidogrel  75 mg Oral Q breakfast  . Influenza vac split quadrivalent PF  0.5 mL Intramuscular Tomorrow-1000  . irbesartan  150 mg Oral Daily  . loratadine  10 mg Oral Daily  . magnesium sulfate 1 - 4 g bolus IVPB  2 g Intravenous Once  . metoprolol succinate  100 mg Oral Daily  . pantoprazole  40 mg Oral Daily  . pneumococcal 23 valent vaccine  0.5 mL Intramuscular Tomorrow-1000  . potassium chloride  40 mEq Oral Q2H   . 0.9 % NaCl with KCl 20 mEq / L 75 mL/hr at 04/14/15 0601    ASSESSMENT AND PLAN:  1. PVD (peripheral vascular disease) - s/p R common iliac stent - 2007 - lower extremity angiography on 04/13/2015 showed 75% calcified ostial left common iliac artery stenosis with a 40 mm. Successful I-cast stenting performed. Will need to remain on DAPT with ASA and Plavix. - Follow-up arterial dopplers in 1 week and F/U with Dr. Gwenlyn Rose in 2-3 weeks.  2. HTN - BP has been 105/62 - 176/116 in the past 24 hours - continue current medical therapy  3. History of CAD - BMS to RCA in 2007. In-stent restenosis, 2008. - Continue current medical therapy  4. Hypokalemia - Potassium at 2.3 on 04/14/15 - repletion with potassium chloride 65mq Q2H and NaCl Kcl 20 mEq at 759mhr. - will recheck prior to potential  discharge.  5. Hypomagnesemia - depleted at 1.1 on 04/14/15 - ordered 2g magnesium sulfate to be given this AM - will recheck prior to potential discharge  Signed, Sean Heritage PA-C 6:46 AM 04/14/2015 Pager: [phone] seen and examined and history reviewed. Agree with above findings and plan. Doing well post left iliac stenting. BP labile. Potassium and magnesium levels markedly low. He has chronically low potassium but not at this level. Low sodium and BUN suggests this may be dilutional. Will replete K+ and Mg and repeat. Anticipate DC today. Continue DAPT. Will arrange follow up dopplers in the office then see Dr. BeGwenlyn Rose Peter JoMartiniqueMDAngus/16/2016 7:46 AM

## 2015-04-17 ENCOUNTER — Other Ambulatory Visit: Payer: Self-pay | Admitting: Cardiovascular Disease

## 2015-04-17 DIAGNOSIS — I700 Atherosclerosis of aorta: Secondary | ICD-10-CM

## 2015-04-17 DIAGNOSIS — I739 Peripheral vascular disease, unspecified: Secondary | ICD-10-CM

## 2015-04-19 ENCOUNTER — Ambulatory Visit (HOSPITAL_COMMUNITY)
Admission: RE | Admit: 2015-04-19 | Discharge: 2015-04-19 | Disposition: A | Discharge status: Home / Self Care | Payer: BLUE CROSS/BLUE SHIELD | Source: Ambulatory Visit | Attending: Physician Assistant | Admitting: Physician Assistant

## 2015-04-19 ENCOUNTER — Ambulatory Visit (HOSPITAL_BASED_OUTPATIENT_CLINIC_OR_DEPARTMENT_OTHER)
Admission: RE | Admit: 2015-04-19 | Discharge: 2015-04-19 | Disposition: A | Discharge status: Home / Self Care | Payer: BLUE CROSS/BLUE SHIELD | Source: Ambulatory Visit | Attending: Cardiovascular Disease | Admitting: Cardiovascular Disease

## 2015-04-19 DIAGNOSIS — I77819 Aortic ectasia, unspecified site: Secondary | ICD-10-CM | POA: Insufficient documentation

## 2015-04-19 DIAGNOSIS — I700 Atherosclerosis of aorta: Secondary | ICD-10-CM

## 2015-04-19 DIAGNOSIS — I10 Essential (primary) hypertension: Secondary | ICD-10-CM | POA: Diagnosis not present

## 2015-04-19 DIAGNOSIS — I708 Atherosclerosis of other arteries: Secondary | ICD-10-CM | POA: Diagnosis not present

## 2015-04-19 DIAGNOSIS — F17200 Nicotine dependence, unspecified, uncomplicated: Secondary | ICD-10-CM | POA: Diagnosis not present

## 2015-04-19 DIAGNOSIS — E785 Hyperlipidemia, unspecified: Secondary | ICD-10-CM | POA: Insufficient documentation

## 2015-04-19 DIAGNOSIS — I739 Peripheral vascular disease, unspecified: Secondary | ICD-10-CM | POA: Insufficient documentation

## 2015-04-21 ENCOUNTER — Other Ambulatory Visit: Payer: Self-pay

## 2015-04-21 ENCOUNTER — Telehealth: Payer: Self-pay

## 2015-04-21 DIAGNOSIS — I739 Peripheral vascular disease, unspecified: Secondary | ICD-10-CM

## 2015-04-21 NOTE — Telephone Encounter (Signed)
Called pt to discuss ABI results and pt had various questions and concerns.   During hospilization on 9/16 pt K was 2.3 and he is worried why it was so low and wants to be tested again during next OV.  Pt also was told by tech during his ABI's that he has an erratic heart beat and he stated he has never been told this before, and can he be evaluated for this during his next OV.   Instructed pt that I would share his concerns with Suanne Marker Barrett (OV scheduled on 10/5 @ 9am) and Dr. Gwenlyn Found so they are aware.   Pt verbalized understanding no additional questions at this time.

## 2015-05-03 ENCOUNTER — Ambulatory Visit: Payer: BLUE CROSS/BLUE SHIELD | Admitting: Physician Assistant

## 2015-05-22 ENCOUNTER — Ambulatory Visit (INDEPENDENT_AMBULATORY_CARE_PROVIDER_SITE_OTHER): Payer: BLUE CROSS/BLUE SHIELD | Admitting: Physician Assistant

## 2015-05-22 ENCOUNTER — Encounter: Payer: Self-pay | Admitting: Physician Assistant

## 2015-05-22 VITALS — BP 120/72 | HR 64 | Ht 69.0 in | Wt 129.5 lb

## 2015-05-22 DIAGNOSIS — I10 Essential (primary) hypertension: Secondary | ICD-10-CM | POA: Diagnosis not present

## 2015-05-22 DIAGNOSIS — I739 Peripheral vascular disease, unspecified: Secondary | ICD-10-CM | POA: Diagnosis not present

## 2015-05-22 DIAGNOSIS — N528 Other male erectile dysfunction: Secondary | ICD-10-CM

## 2015-05-22 LAB — BASIC METABOLIC PANEL
BUN: 4 mg/dL — AB (ref 7–25)
CHLORIDE: 98 mmol/L (ref 98–110)
CO2: 28 mmol/L (ref 20–31)
Calcium: 8.9 mg/dL (ref 8.6–10.3)
Creat: 0.58 mg/dL — ABNORMAL LOW (ref 0.70–1.33)
GLUCOSE: 103 mg/dL — AB (ref 65–99)
POTASSIUM: 3.6 mmol/L (ref 3.5–5.3)
Sodium: 135 mmol/L (ref 135–146)

## 2015-05-22 MED ORDER — SILDENAFIL CITRATE 20 MG PO TABS: 20.0000 mg | ORAL_TABLET | Freq: Every day | ORAL | Status: DC | PRN

## 2015-05-22 NOTE — Progress Notes (Signed)
  Cardiology Office Note   Date:  05/22/2015   ID:  Sean Rose, DOB 06/27/1956, MRN [id number]  PCP:  William Hopper, MD  Cardiologist:  Dr Berry  Barrett, Rhonda, PA-C   Chief Complaint  Patient presents with  . Follow-up    post stent//pt c/o tingling in both feet, more in the left foot//no other Sx.    History of Present Illness: Sean Rose is a 59 y.o. male with a history of CAD (BMS to RCA in 2007 with in-stent restenosis during 2008), PVD (s/p R common iliac stent - 2007), HTN, HLD, and tobacco abuse.   Admitted for LE angiogram 04/13/2015 and had L-CIA stent placed.   Sean Rose presents for post-hospital followup.  Since discharge, he has done fairly well. He had some pain and bruising at his L groin cath site, but that has resolved. Prior to the procedure, he was having problems with tingling in both feet. The tingling improved some, but is still there. There was also some pain prior to stenting in the lateral L foot, but this has improved.   He is still smoking.  He is having problems with erectile dysfunction and  Past Medical History  Diagnosis Date  . CAD (coronary artery disease)   . History of anal fissures   . Hemorrhoids   . Allergy     seasonal allergies  . GERD (gastroesophageal reflux disease)   . Hyperlipidemia   . Hypertension   . Peripheral arterial disease (HCC)     status post right common iliac artery stenting back in 2007  . Tobacco abuse     Past Surgical History  Procedure Laterality Date  . Coronary stent placement  2007 & 2009  . Femoral artery stent    . Ortho surgeries      8 different surgeries  . Colonoscopy      2003, 2014  . Upper gastrointestinal endoscopy    . Percutaneous stent intervention  04/04/2006 & 04/13/2015    a. Right common iliac artery with an 8.0x18 mm Herculink stent deployed at 12 atm. Stenosis was reduced from 80% to 0% with brisk flow. b. I-cast stenting to left common iliac artery  . Carotid  doppler  08/09/2011    Bilateral Bulb/Proximal ICA - demonstrated a mild amount of fibrous plaque without evidence of significant diameter reduction reduction or other vascular abnormality.  . Cardiac catheterization  09/23/2008    Continued medical therapy - may need GI evaluation in addition.  . Cardiac catheterization  10/28/2007    Medical therapy recommended.  . Cardiac catheterization  11/18/2006    In-stent restenosis RCA  (50% distal edge, 80% segmental mid, and 50-60% segmental proximal). Successful cutting balloon atherectomy using a [id number] cutting balloon. 3 inflations with atherectomy performed on mid and proximal portions resulting in reduction of 80% mid in-stent restenosis to less than 20% residual and 50-60% segmental proximal to less than 20% residual without dissection.  . Cardiac catheterization  02/26/2006    Severe stenosis in RCA. Stenting performed using IVUS. 3.5x20 Maverick balloon deployed at 6atm 34sec. Distal stent-a 4x28 Liberte stent-deployed 12atm 48sec, 12atm 31sec, 4atm 19sec. Mid stent-a 4x28 Liberte stent-deployed 14atm 45sec, 14atm 60sec, 14atm 44sec. Proximal stent-4x8 Liberte- 14atm 45sec,14atm 47sec, 16atm 43sec. Severely diseased segment then appeared TIMI-3 flow.  . Cardiovascular stress test  11/17/2012    No significant ECG changes. Septal perfusion defect is new when complared to study from 2010. Abnormal myocardial perfusion imaging with a   basal to mid perfusion suggestive of previous MI.  . Transthoracic echocardiogram  11/26/2012    EF not noted. Aortic valve-sclerosis without stenosis, no regurgiation.   . US carotid doppler bilateral ([facility] hx)  08/09/2011    Bilateral Bulb/Proximal ICAa demonstrated a mild amount of fibrous plaque without evidence of significant diameter reduction or any other vascular abnormality.  . Lower extremity arterial doppler  01/31/2011    Bilateral ABIs-normal values with no suggestion of arterial insuff to the lower extremities at rest.  Right CIA stent-mild amount of nonhemodynamically significant plaque is noted throughout  . Peripheral vascular catheterization N/A 04/13/2015    Procedure: Lower Extremity Angiography;  Surgeon: Lorretta Harp, MD; L-oCIA 75%, 40-50% L-EIA, R-CIA stent patent, s/p 8 mm x 38 mm ICast covered stent>>0% stenosis in L-oCIA     1: Abdominal aortogram-there was a small fluoroscopically calcified abdominal aortic aneurysm 2: Left lower extremity-75% calcified ostial left common iliac artery stenosis with a 40 mm pullback gradient using a 5 French endhole catheter after administration of 200 g of intra-arterial ventricle strain. There was a 40-50% long segmental left external iliac artery stenosis without a pullback gradient 3: Right lower extremity-widely patent ostial right common iliac artery stent  IMPRESSION:Sean Rose has a high-grade physiologically significant calcified ostial left common iliac artery stenosis. We'll proceed with PTA and stenting using an ICast covered stent and kissing stent technique.  Procedure Description:a 7 French bright tip sheath was inserted into the left common femoral artery. The patient received 5000 units of heparin with an ACT of 257. Total contrast used was 100 mL administered to the patient. I put a 7 mm x 2 cm balloon across the patent right common iliac iliac artery stent. I then placed a 5 mm x 2 cm balloon across the ostium of the left common iliac artery over a 0.35 Versicore wire for predilatation. Following this I placed a 8 mm x 38 mm long I Covered stent   Current Outpatient Prescriptions  Medication Sig Dispense Refill  . amLODipine (NORVASC) 5 MG tablet take 1 tablet by mouth once daily 30 tablet 10  . aspirin EC 81 MG tablet Take 81 mg by mouth daily.    . clopidogrel (PLAVIX) 75 MG tablet take 1 tablet by mouth once daily 30 tablet 10  . esomeprazole (NEXIUM) 40 MG capsule Take 40 mg by mouth daily.     . fexofenadine (ALLEGRA) 180 MG tablet Take 180  mg by mouth daily.    . metoprolol succinate (TOPROL-XL) 100 MG 24 hr tablet take 1 tablet by mouth once daily 30 tablet 10  . Multiple Vitamin (MULTIVITAMIN WITH MINERALS) TABS tablet Take 1 tablet by mouth daily.    . nitroGLYCERIN (NITROSTAT) 0.4 MG SL tablet Place 1 tablet (0.4 mg total) under the tongue every 5 (five) minutes as needed for chest pain. 25 tablet 5  . valsartan (DIOVAN) 160 MG tablet Take 1 tablet (160 mg total) by mouth daily. 30 tablet 1   No current facility-administered medications for this visit.    Allergies:   Tylox    Social History:  The patient  reports that he has been smoking Cigarettes.  He has been smoking about 1.00 pack per day. He does not have any smokeless tobacco history on file. He reports that he drinks about 21.0 oz of alcohol per week. He reports that he does not use illicit drugs.   Family History:  The patient's family history includes Colon cancer in his  mother; Heart disease in his father and paternal grandfather.    ROS:  Please see the history of present illness. All other systems are reviewed and negative.    PHYSICAL EXAM: VS:  BP 120/72 mmHg  Pulse 64  Ht 5' 9" (1.753 m)  Wt 129 lb 8 oz (58.741 kg)  BMI 19.12 kg/m2 , BMI Body mass index is 19.12 kg/(m^2). GEN: Well nourished, well developed, male in no acute distress HEENT: normal for age, poor dentition  Neck: no JVD, no carotid bruit, no masses Cardiac: RRR; no murmur, no rubs, or gallops Respiratory:  clear to auscultation bilaterally, normal work of breathing GI: soft, nontender, nondistended, + BS MS: no deformity or atrophy; no edema; distal pulses are 2+ in all 4 extremities; bilateral femoral bruits are noted, cath sites have healed well.  Skin: warm and dry, no rash Neuro:  Strength and sensation are intact Psych: euthymic mood, full affect   EKG:  EKG is not ordered today.  Recent Labs: 04/04/2015: TSH 1.214 04/14/2015: BUN <5*; Creatinine, Ser 0.62; Hemoglobin  13.3; Magnesium 2.0; Platelets 273; Potassium 3.4*; Sodium 130*    Lipid Panel    Component Value Date/Time   CHOL 189 12/18/2012 0814   TRIG 224* 12/18/2012 0814   HDL 43 12/18/2012 0814   CHOLHDL 4.4 12/18/2012 0814   VLDL 45* 12/18/2012 0814   LDLCALC 101* 12/18/2012 0814     Wt Readings from Last 3 Encounters:  05/22/15 129 lb 8 oz (58.741 kg)  04/14/15 130 lb 8 oz (59.194 kg)  04/04/15 130 lb (58.968 kg)     Other studies Reviewed: Additional studies/ records that were reviewed today include: hospital records, cath reports and office notes.  ASSESSMENT AND PLAN:  1.  PAD: s/p L-CIA stent placement. Pt doing well post-procedure.  2. Paresthesias both feet: discussed the possibility of small vessel disease and neuropathy. He could have either. Encouraged healthy lifestyle changes and advised him to contact us if the symptoms got any worse. No real treatment at this time. He is not having much pain and the tingling is annoying, no numbness or danger of falling.  3. ED: Because of his CAD and PAD, I do not wish to d/c the BB to see if this helps. He had a coupon from a local pharmacy for discounted Viagra and he was given a prescription in accordance with their protocols. It was made very clear to him that he MUST NOT take SL NTG within 24 hours of the Viagra. He understands.   Current medicines are reviewed at length with the patient today.  The patient does not have concerns regarding medicines.  The following changes have been made:  no change  Labs/ tests ordered today include:  No orders of the defined types were placed in this encounter.    Disposition:   FU with Dr Berry in 6 months.  Signed, Barrett, Rhonda, PA-C  05/22/2015 9:56 AM    [facility] [address], [place], [place]  [address] Phone: [phone]; Fax: [phone]     

## 2015-05-22 NOTE — Patient Instructions (Signed)
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 If you don't receive a letter, please call our office to schedule the follow-up appointment.

## 2015-07-13 ENCOUNTER — Telehealth: Payer: Self-pay

## 2015-07-13 NOTE — Telephone Encounter (Signed)
Left Voice Mail for pt to call back.   RE: Flu Vaccine for 2016  

## 2015-09-27 ENCOUNTER — Other Ambulatory Visit: Payer: Self-pay | Admitting: *Deleted

## 2015-09-27 MED ORDER — NITROGLYCERIN 0.4 MG SL SUBL: 0.4000 mg | SUBLINGUAL_TABLET | SUBLINGUAL | Status: DC | PRN

## 2016-04-21 ENCOUNTER — Other Ambulatory Visit: Payer: Self-pay | Admitting: Cardiovascular Disease

## 2016-05-08 DIAGNOSIS — S61202A Unspecified open wound of right middle finger without damage to nail, initial encounter: Secondary | ICD-10-CM | POA: Diagnosis not present

## 2016-05-21 ENCOUNTER — Other Ambulatory Visit: Payer: Self-pay | Admitting: Cardiovascular Disease

## 2016-06-18 ENCOUNTER — Other Ambulatory Visit: Payer: Self-pay | Admitting: Cardiovascular Disease

## 2016-06-21 ENCOUNTER — Other Ambulatory Visit: Payer: Self-pay | Admitting: Cardiovascular Disease

## 2016-06-24 NOTE — Telephone Encounter (Signed)
Pt's wife called and made appt Erasmo Score for 07-12-16, ne needs refills of Amlodipine, Valsartan, Metoprolol, and Plavix to [facility]--PT OUT AS OF TOMORROW

## 2016-07-12 ENCOUNTER — Ambulatory Visit (INDEPENDENT_AMBULATORY_CARE_PROVIDER_SITE_OTHER): Payer: BLUE CROSS/BLUE SHIELD | Admitting: Cardiovascular Disease

## 2016-07-12 ENCOUNTER — Encounter: Payer: Self-pay | Admitting: Cardiovascular Disease

## 2016-07-12 VITALS — BP 148/80 | HR 64 | Ht 69.0 in | Wt 129.0 lb

## 2016-07-12 DIAGNOSIS — I739 Peripheral vascular disease, unspecified: Secondary | ICD-10-CM

## 2016-07-12 DIAGNOSIS — I2510 Atherosclerotic heart disease of native coronary artery without angina pectoris: Secondary | ICD-10-CM | POA: Diagnosis not present

## 2016-07-12 DIAGNOSIS — E785 Hyperlipidemia, unspecified: Secondary | ICD-10-CM | POA: Diagnosis not present

## 2016-07-12 DIAGNOSIS — I10 Essential (primary) hypertension: Secondary | ICD-10-CM | POA: Diagnosis not present

## 2016-07-12 NOTE — Assessment & Plan Note (Signed)
History of peripheral arterial disease status post remote right common iliac artery stent in 2007. Because of recurrent claudication and abnormal Doppler studies I re-angiogram him 04/13/15 and performed cover stenting of his proximal left common iliac artery with an 8 mm x 30 mm long by cast covered stent. This rate iliac stent was widely patent. Follow-up Dopplers were stable and his claudication resolved.

## 2016-07-12 NOTE — Assessment & Plan Note (Signed)
History of coronary artery disease status post RCA stenting in the past with bare-metal stents. He did have in-stent restenosis with redilatation of prostate 5 years ago. He denies chest pain or shortness of breath.

## 2016-07-12 NOTE — Assessment & Plan Note (Addendum)
History of hypertension blood pressure measured today 140/80. He is on amlodipine and metoprolol, and valsartan. Continue current meds at current dosing

## 2016-07-12 NOTE — Progress Notes (Signed)
07/12/2016 Sean Rose   [birth year]  [id number]  Primary Physician Unice Cobble, MD Primary Cardiologist: Lorretta Harp MD [name]  HPI:  The patient is a 60 year old thin appearing married Caucasian male father of 2 who I last saw in the office 04/04/15. He has a history of RCA stenting in the past with bare metal stents. He has had in-stent restenosis with re-dilatation approximately 4 years ago. He has also had remote right common iliac artery PTA and stenting back in 2007. His other problems include hypertension, hyperlipidemia and continued tobacco abuse at one half to one packs a day despite counseling to the contrary. He denies chest pain , shortness of breath . He was complaining of some claudication back in September and we ultimately proceeded with angiography 04/13/15 revealing a widely patent right iliac stent with 75% ostial left common iliac artery stenosis with a 40 mm gradient. I ultimately stented this with an 8 mm x 38 mm long ICast  covered stent with excellent angiographic clinical and ultrasonographic result. He no longer has claudication.    Current Outpatient Prescriptions  Medication Sig Dispense Refill  . amLODipine (NORVASC) 5 MG tablet Take 1 tablet (5 mg total) by mouth daily. Please keep appointment 30 tablet 0  . aspirin EC 81 MG tablet Take 81 mg by mouth daily.    . clopidogrel (PLAVIX) 75 MG tablet Take 1 tablet (75 mg total) by mouth daily. Please keep appointment 30 tablet 0  . esomeprazole (NEXIUM) 40 MG capsule Take 40 mg by mouth daily.     . fexofenadine (ALLEGRA) 180 MG tablet Take 180 mg by mouth daily.    . metoprolol succinate (TOPROL-XL) 100 MG 24 hr tablet Take 1 tablet (100 mg total) by mouth daily. Please keep appointment 30 tablet 0  . Multiple Vitamin (MULTIVITAMIN WITH MINERALS) TABS tablet Take 1 tablet by mouth daily.    . nitroGLYCERIN (NITROSTAT) 0.4 MG SL tablet Place 1 tablet (0.4 mg total) under the tongue every  5 (five) minutes as needed for chest pain. 25 tablet 3  . sildenafil (REVATIO) 20 MG tablet Take 1-5 tablets (20-100 mg total) by mouth daily as needed. 50 tablet 1  . valsartan (DIOVAN) 160 MG tablet Take 1 tablet (160 mg total) by mouth daily. PLEASE SCHEDULE APPOINTMENT 30 tablet 0  . valsartan (DIOVAN) 160 MG tablet Take 1 tablet (160 mg total) by mouth daily. Please keep appointment 30 tablet 0   No current facility-administered medications for this visit.     Allergies  Allergen Reactions  . Tylox [Oxycodone-Acetaminophen] Nausea Only    Social History   Social History  . Marital status: Married    Spouse name: N/A  . Number of children: N/A  . Years of education: N/A   Occupational History  . Not on file.   Social History Main Topics  . Smoking status: Current Every Day Smoker    Packs/day: 1.00    Types: Cigarettes  . Smokeless tobacco: Never Used     Comment: smoked age 66-present , up to 2 ppd. 12/24/12 < 1 ppd  . Alcohol use 21.0 oz/week    35 Cans of beer per week     Comment:  14 drinks/ week  . Drug use: No  . Sexual activity: Not on file   Other Topics Concern  . Not on file   Social History Narrative  . No narrative on file     Review of  Systems: General: negative for chills, fever, night sweats or weight changes.  Cardiovascular: negative for chest pain, dyspnea on exertion, edema, orthopnea, palpitations, paroxysmal nocturnal dyspnea or shortness of breath Dermatological: negative for rash Respiratory: negative for cough or wheezing Urologic: negative for hematuria Abdominal: negative for nausea, vomiting, diarrhea, bright red blood per rectum, melena, or hematemesis Neurologic: negative for visual changes, syncope, or dizziness All other systems reviewed and are otherwise negative except as noted above.    Blood pressure (!) 148/80, pulse 64, height '5\' 9"'$  (1.753 m), weight 129 lb (58.5 kg).  General appearance: alert and no distress Neck: no  adenopathy, no carotid bruit, no JVD, supple, symmetrical, trachea midline and thyroid not enlarged, symmetric, no tenderness/mass/nodules Lungs: clear to auscultation bilaterally Heart: regular rate and rhythm, S1, S2 normal, no murmur, click, rub or gallop Extremities: extremities normal, atraumatic, no cyanosis or edema  EKG sinus rhythm at 64 without ST or T-wave changes. I personally reviewed this EKG  ASSESSMENT AND PLAN:   PVD (peripheral vascular disease) History of peripheral arterial disease status post remote right common iliac artery stent in 2007. Because of recurrent claudication and abnormal Doppler studies I re-angiogram him 04/13/15 and performed cover stenting of his proximal left common iliac artery with an 8 mm x 30 mm long by cast covered stent. This rate iliac stent was widely patent. Follow-up Dopplers were stable and his claudication resolved.  Coronary atherosclerosis of native coronary artery History of coronary artery disease status post RCA stenting in the past with bare-metal stents. He did have in-stent restenosis with redilatation of prostate 5 years ago. He denies chest pain or shortness of breath.  Essential hypertension History of hypertension blood pressure measured today 140/80. He is on amlodipine and metoprolol, and valsartan. Continue current meds at current dosing  Hyperlipidemia History of hyperlipidemia not on statin therapy. We will check a lipid and liver profile      Lorretta Harp MD [facility] 07/12/2016 4:37 PM

## 2016-07-12 NOTE — Assessment & Plan Note (Signed)
History of hyperlipidemia not on statin therapy. We will check a lipid and liver profile

## 2016-07-12 NOTE — Patient Instructions (Signed)
Medication Instructions: Your physician recommends that you continue on your current medications as directed. Please refer to the Current Medication list given to you today.   Labwork: Your physician recommends that you return for a FASTING lipid profile and hepatic function panel.   Testing/Procedures: Your physician has requested that you have a lower extremity arterial duplex. During this test, ultrasound is used to evaluate arterial blood flow in the legs. Allow one hour for this exam. There are no restrictions or special instructions.  Your physician has requested that you have an ankle brachial index (ABI). During this test an ultrasound and blood pressure cuff are used to evaluate the arteries that supply the arms and legs with blood. Allow thirty minutes for this exam. There are no restrictions or special instructions.  Follow-Up: Your physician wants you to follow-up in: 1 year with Dr. Gwenlyn Found. You will receive a reminder letter in the mail two months in advance. If you don't receive a letter, please call our office to schedule the follow-up appointment.  If you need a refill on your cardiac medications before your next appointment, please call your pharmacy.

## 2016-07-19 ENCOUNTER — Other Ambulatory Visit: Payer: Self-pay | Admitting: Cardiovascular Disease

## 2016-08-16 ENCOUNTER — Ambulatory Visit (HOSPITAL_COMMUNITY)
Admission: RE | Admit: 2016-08-16 | Discharge: 2016-08-16 | Disposition: A | Discharge status: Home / Self Care | Payer: BLUE CROSS/BLUE SHIELD | Source: Ambulatory Visit | Attending: Cardiology | Admitting: Cardiology

## 2016-08-16 ENCOUNTER — Other Ambulatory Visit: Payer: Self-pay | Admitting: Cardiovascular Disease

## 2016-08-16 DIAGNOSIS — I708 Atherosclerosis of other arteries: Secondary | ICD-10-CM | POA: Diagnosis not present

## 2016-08-16 DIAGNOSIS — I700 Atherosclerosis of aorta: Secondary | ICD-10-CM | POA: Diagnosis not present

## 2016-08-16 DIAGNOSIS — I739 Peripheral vascular disease, unspecified: Secondary | ICD-10-CM

## 2016-08-19 ENCOUNTER — Other Ambulatory Visit: Payer: Self-pay | Admitting: Cardiovascular Disease

## 2016-08-19 DIAGNOSIS — I739 Peripheral vascular disease, unspecified: Secondary | ICD-10-CM

## 2016-09-17 ENCOUNTER — Telehealth: Payer: Self-pay | Admitting: Cardiovascular Disease

## 2016-09-17 NOTE — Telephone Encounter (Signed)
New message   Pt verbalized that he is calling for rn: he took all of his prescribed medications twice this morning and he don't feel good  He feels very lethargic

## 2016-09-17 NOTE — Telephone Encounter (Signed)
Received call from patient-patient states he accidentally took all of his medication twice this AM and is now feeling "lethargic and sleepy".  Pt is at work and unable to check BP.  Verified with patient this was an accident and not intentional.  Current cardiac medications: (Patient took two doses this AM-one dose at 5:10AM and one dose at 6:00AM) Amlodipine '5mg'$  Asa '81mg'$  Plavix '75mg'$  Toprol XL '100mg'$  Sildenafil '20mg'$  Diovan '160mg'$   Spoke with pharmacist, Erasmo Downer for advice.  Advised to closely monitor BP and HR.  If HR <50 then patient needs to proceed to ER.  Patient checked HR while on the phone-59bpm  Advised to continue to closely monitor and if HR <50 or symptoms get worse then have someone drive to ER or call [phone].  Patient agrees and verbalized understanding.

## 2016-09-26 DIAGNOSIS — J189 Pneumonia, unspecified organism: Secondary | ICD-10-CM

## 2016-09-26 HISTORY — DX: Pneumonia, unspecified organism: J18.9

## 2016-10-02 DIAGNOSIS — M7712 Lateral epicondylitis, left elbow: Secondary | ICD-10-CM | POA: Diagnosis not present

## 2016-10-09 ENCOUNTER — Ambulatory Visit (INDEPENDENT_AMBULATORY_CARE_PROVIDER_SITE_OTHER): Payer: BLUE CROSS/BLUE SHIELD | Admitting: Adult Health

## 2016-10-09 ENCOUNTER — Encounter: Payer: Self-pay | Admitting: Adult Health

## 2016-10-09 VITALS — BP 146/72 | Temp 98.3°F | Ht 69.0 in | Wt 132.3 lb

## 2016-10-09 DIAGNOSIS — M549 Dorsalgia, unspecified: Secondary | ICD-10-CM

## 2016-10-09 DIAGNOSIS — G8929 Other chronic pain: Secondary | ICD-10-CM

## 2016-10-09 DIAGNOSIS — M5489 Other dorsalgia: Secondary | ICD-10-CM

## 2016-10-09 MED ORDER — MELOXICAM 7.5 MG PO TABS: 7.5000 mg | ORAL_TABLET | Freq: Every day | ORAL | 0 refills | Status: DC

## 2016-10-09 NOTE — Progress Notes (Addendum)
Subjective:    Patient ID: Sean Rose, male    DOB: 10/28/1955, 61 y.o.   MRN: [id number]  HPI 61 year old male who  has a past medical history of Allergy; CAD (coronary artery disease); GERD (gastroesophageal reflux disease); Hemorrhoids; History of anal fissures; Hyperlipidemia; Hypertension; Peripheral arterial disease ([place]); and Tobacco abuse. He presents to the clinic today with the acute on chronic complaint of " back pain". He reports that for numerous years he's had pain in his cervical spine and at one point was told "I have degeneration in my neck". Is of over the last few months he's had increasing pain in his cervical spine with radiation to lumbar spine. He does report being seen by a neurosurgeon in the past but this was when he was in his 33s, and they do not want to do surgery at that time. He does report that the chronic back pain does not impede his activities of daily living, but it restricts "the amount of golfing I can do".  He does report occasional episodes of numbness tingling and "sharp pain" in his lower extremities.  He does not have any issues with loss of bowel or bladder, or saddle anesthesia  He has been taking Advil which worked in the past but he reports is no longer working well   Review of Systems  Constitutional: Negative.   Respiratory: Negative.   Cardiovascular: Negative.   Musculoskeletal: Positive for arthralgias and back pain. Negative for gait problem and joint swelling.  Skin: Negative.   Neurological: Positive for numbness.  All other systems reviewed and are negative.  Past Medical History:  Diagnosis Date  . Allergy    seasonal allergies  . CAD (coronary artery disease)   . GERD (gastroesophageal reflux disease)   . Hemorrhoids   . History of anal fissures   . Hyperlipidemia   . Hypertension   . Peripheral arterial disease ([place])    status post right common iliac artery stenting back in 2007  . Tobacco abuse     Social History     Social History  . Marital status: Married    Spouse name: N/A  . Number of children: N/A  . Years of education: N/A   Occupational History  . Not on file.   Social History Main Topics  . Smoking status: Current Every Day Smoker    Packs/day: 1.00    Types: Cigarettes  . Smokeless tobacco: Never Used     Comment: smoked age 84-present , up to 2 ppd. 12/24/12 < 1 ppd  . Alcohol use 21.0 oz/week    35 Cans of beer per week     Comment:  14 drinks/ week  . Drug use: No  . Sexual activity: Not on file   Other Topics Concern  . Not on file   Social History Narrative  . No narrative on file    Past Surgical History:  Procedure Laterality Date  . CARDIAC CATHETERIZATION  09/23/2008   Continued medical therapy - may need GI evaluation in addition.  Marland Kitchen CARDIAC CATHETERIZATION  10/28/2007   Medical therapy recommended.  Marland Kitchen CARDIAC CATHETERIZATION  11/18/2006   In-stent restenosis RCA  (50% distal edge, 80% segmental mid, and 50-60% segmental proximal). Successful cutting balloon atherectomy using a 325X15 cutting balloon. 3 inflations with atherectomy performed on mid and proximal portions resulting in reduction of 80% mid in-stent restenosis to less than 20% residual and 50-60% segmental proximal to less than 20% residual without dissection.  Marland Kitchen  CARDIAC CATHETERIZATION  02/26/2006   Severe stenosis in RCA. Stenting performed using IVUS. 3.5x20 Maverick balloon deployed at [employer]. Distal stent-a 4x28 Liberte stent-deployed 12atm 48sec, 12atm 31sec, 4atm 19sec. Mid stent-a 4x28 Liberte stent-deployed 14atm 45sec, 14atm 60sec, 14atm 44sec. Proximal stent-4x8 Liberte- 14atm 45sec,14atm 47sec, 16atm 43sec. Severely diseased segment then appeared TIMI-3 flow.  Marland Kitchen CARDIOVASCULAR STRESS TEST  11/17/2012   No significant ECG changes. Septal perfusion defect is new when complared to study from 2010. Abnormal myocardial perfusion imaging with a basal to mid perfusion suggestive of previous MI.  Marland Kitchen  CAROTID DOPPLER  08/09/2011   Bilateral Bulb/Proximal ICA - demonstrated a mild amount of fibrous plaque without evidence of significant diameter reduction reduction or other vascular abnormality.  . COLONOSCOPY     2003, 2014  . CORONARY STENT PLACEMENT  2007 & 2009  . FEMORAL ARTERY STENT    . LOWER EXTREMITY ARTERIAL DOPPLER  01/31/2011   Bilateral ABIs-normal values with no suggestion of arterial insuff to the lower extremities at rest. Right CIA stent-mild amount of nonhemodynamically significant plaque is noted throughout  . Ortho surgeries     8 different surgeries  . PERCUTANEOUS STENT INTERVENTION  04/04/2006 & 04/13/2015   a. Right common iliac artery with an 8.0x18 mm Herculink stent deployed at 12 atm. Stenosis was reduced from 80% to 0% with brisk flow. b. I-cast stenting to left common iliac artery  . PERIPHERAL VASCULAR CATHETERIZATION N/A 04/13/2015   Procedure: Lower Extremity Angiography;  Surgeon: Lorretta Harp, MD; L-oCIA 75%, 40-50% L-EIA, R-CIA stent patent, s/p 8 mm x 38 mm ICast covered stent>>0% stenosis in L-oCIA     . TRANSTHORACIC ECHOCARDIOGRAM  11/26/2012   EF not noted. Aortic valve-sclerosis without stenosis, no regurgiation.   Marland Kitchen UPPER GASTROINTESTINAL ENDOSCOPY    . US CAROTID DOPPLER BILATERAL ([place] HX)  08/09/2011   Bilateral Bulb/Proximal ICAa demonstrated a mild amount of fibrous plaque without evidence of significant diameter reduction or any other vascular abnormality.    Family History  Problem Relation Age of Onset  . Colon cancer Mother   . Heart disease Father   . Heart disease Paternal Grandfather     Allergies  Allergen Reactions  . Tylox [Oxycodone-Acetaminophen] Nausea Only    Current Outpatient Prescriptions on File Prior to Visit  Medication Sig Dispense Refill  . amLODipine (NORVASC) 5 MG tablet Take 1 tablet (5 mg total) by mouth daily. 30 tablet 11  . aspirin EC 81 MG tablet Take 81 mg by mouth daily.    . clopidogrel (PLAVIX) 75 MG  tablet Take 1 tablet (75 mg total) by mouth daily. 30 tablet 11  . esomeprazole (NEXIUM) 40 MG capsule Take 40 mg by mouth daily.     . fexofenadine (ALLEGRA) 180 MG tablet Take 180 mg by mouth daily.    . metoprolol succinate (TOPROL-XL) 100 MG 24 hr tablet Take 1 tablet (100 mg total) by mouth daily. 30 tablet 11  . Multiple Vitamin (MULTIVITAMIN WITH MINERALS) TABS tablet Take 1 tablet by mouth daily.    . nitroGLYCERIN (NITROSTAT) 0.4 MG SL tablet Place 1 tablet (0.4 mg total) under the tongue every 5 (five) minutes as needed for chest pain. 25 tablet 3  . valsartan (DIOVAN) 160 MG tablet Take 1 tablet (160 mg total) by mouth daily. PLEASE SCHEDULE APPOINTMENT 30 tablet 0   No current facility-administered medications on file prior to visit.     BP (!) 146/72 (BP Location: Left Arm, Patient Position:  Sitting, Cuff Size: Normal)   Temp 98.3 F (36.8 C) (Oral)   Ht '5\' 9"'  (1.753 m)   Wt 132 lb 4.8 oz (60 kg)   BMI 19.54 kg/m       Objective:   Physical Exam  Constitutional: He is oriented to person, place, and time. He appears well-developed and well-nourished. No distress.  Cardiovascular: Normal rate, regular rhythm, normal heart sounds and intact distal pulses.  Exam reveals no gallop and no friction rub.   No murmur heard. Pulmonary/Chest: Effort normal and breath sounds normal. No respiratory distress. He has no wheezes. He has no rales. He exhibits no tenderness.  Musculoskeletal: Normal range of motion. He exhibits no edema, tenderness or deformity.  Slumping posture noted. No bony tenderness  Neurological: He is alert and oriented to person, place, and time. He has normal reflexes. He displays normal reflexes. No cranial nerve deficit. He exhibits normal muscle tone. Coordination normal.  Skin: Skin is warm and dry. No rash noted. He is not diaphoretic. No erythema. No pallor.  Psychiatric: He has a normal mood and affect. His behavior is normal. Judgment and thought content  normal.  Nursing note and vitals reviewed.     Assessment & Plan:  1. Other chronic back pain - Possible cervical radiculopathy or disk herniation. Will refer to neurosurgery for further evaluation. Will do short course of anti inflammatory  - meloxicam (MOBIC) 7.5 MG tablet; Take 1 tablet (7.5 mg total) by mouth daily.  Dispense: 30 tablet; Refill: 0 - Ambulatory referral to Neurosurgery - follow up as needed  - [place]; Future   [name], NP

## 2016-10-09 NOTE — Patient Instructions (Signed)
I have sent in a prescription for mobic. Take this once a day   Someone from neurosurgery will call you to schedule your appointment   Follow up with me to establish care

## 2016-10-14 ENCOUNTER — Telehealth: Payer: Self-pay

## 2016-10-14 NOTE — Telephone Encounter (Signed)
Spoke with pt and he reports that he did not seek ED care. He does report possibly 4 syncopal episodes this past Thursday, and has been feeling "very faint" since then. He also c/o cough with yellow/brown mucus, some nausea and diarrhea and temp at 99.5. Advised pt that given symptoms he needs to be seen and evaluated. He agreed. Nothing available until late this afternoon, so pt asked to come tomorrow so that he could see Tommi Rumps. Scheduled pt with [name], NP for tomorrow. Pt advised if symptoms increase or worsen to seek UC/ED care immediately. Pt voiced understanding. Nothing further needed at this time.  [place]. Thanks!   [place] Patient Name: Sean Rose Gender: Male DOB: 23-Mar-1956 Age: 61 Y 60 M 18 D Return Phone Number: [phone] (Primary) City/State/Zip: [place] Client [facility] Night - Client Client Site [facility] - Night Physician [name] - NP Who Is Calling Patient / Member / Family / Caregiver Call Type Triage / Clinical Relationship To Patient Self Return Phone Number [phone] (Primary) Chief Complaint FAINTING or [place] Reason for Call Symptomatic / Request for Health Information Initial Comment Caller States was seen for neck and back pain , got meds and now can't sleep, nausea , weak , faint Nurse Assessment Nurse: Windle Guard, RN, Lesa Date/Time (Eastern Time): 10/13/2016 7:25:37 PM Confirm and document reason for call. If symptomatic, describe symptoms. ---Caller states he is weak with fever, 99.5. He can't sleep and is nauseated. He has diarrhea. He has a productive cough and shortness of breath. Disp. Time Eilene Ghazi Time) Disposition Final User 10/13/2016 7:36:14 PM Go to ED Now Yes Conner, RN, Emmaline Kluver

## 2016-10-15 ENCOUNTER — Encounter: Payer: Self-pay | Admitting: Adult Health

## 2016-10-15 ENCOUNTER — Ambulatory Visit (INDEPENDENT_AMBULATORY_CARE_PROVIDER_SITE_OTHER): Payer: BLUE CROSS/BLUE SHIELD | Admitting: Adult Health

## 2016-10-15 ENCOUNTER — Ambulatory Visit (INDEPENDENT_AMBULATORY_CARE_PROVIDER_SITE_OTHER)
Admission: RE | Admit: 2016-10-15 | Discharge: 2016-10-15 | Disposition: A | Discharge status: Home / Self Care | Payer: BLUE CROSS/BLUE SHIELD | Source: Ambulatory Visit | Attending: Adult Health | Admitting: Adult Health

## 2016-10-15 VITALS — BP 122/64 | HR 70 | Temp 98.4°F | Ht 69.0 in | Wt 124.8 lb

## 2016-10-15 DIAGNOSIS — R55 Syncope and collapse: Secondary | ICD-10-CM

## 2016-10-15 DIAGNOSIS — J069 Acute upper respiratory infection, unspecified: Secondary | ICD-10-CM

## 2016-10-15 DIAGNOSIS — R0602 Shortness of breath: Secondary | ICD-10-CM | POA: Diagnosis not present

## 2016-10-15 DIAGNOSIS — R05 Cough: Secondary | ICD-10-CM | POA: Diagnosis not present

## 2016-10-15 LAB — CBC WITH DIFFERENTIAL/PLATELET
Basophils Absolute: 0 10*3/uL (ref 0.0–0.1)
Basophils Relative: 0.3 % (ref 0.0–3.0)
EOS PCT: 1.3 % (ref 0.0–5.0)
Eosinophils Absolute: 0.1 10*3/uL (ref 0.0–0.7)
HCT: 39.7 % (ref 39.0–52.0)
Hemoglobin: 14.3 g/dL (ref 13.0–17.0)
LYMPHS ABS: 2 10*3/uL (ref 0.7–4.0)
Lymphocytes Relative: 37.9 % (ref 12.0–46.0)
MCHC: 36 g/dL (ref 30.0–36.0)
MCV: 101.9 fl — AB (ref 78.0–100.0)
MONOS PCT: 17.7 % — AB (ref 3.0–12.0)
Monocytes Absolute: 1 10*3/uL (ref 0.1–1.0)
NEUTROS ABS: 2.3 10*3/uL (ref 1.4–7.7)
Neutrophils Relative %: 42.8 % — ABNORMAL LOW (ref 43.0–77.0)
Platelets: 274 10*3/uL (ref 150.0–400.0)
RBC: 3.89 Mil/uL — AB (ref 4.22–5.81)
RDW: 13.3 % (ref 11.5–15.5)
WBC: 5.4 10*3/uL (ref 4.0–10.5)

## 2016-10-15 LAB — BASIC METABOLIC PANEL
BUN: 5 mg/dL — ABNORMAL LOW (ref 6–23)
CHLORIDE: 95 meq/L — AB (ref 96–112)
CO2: 27 mEq/L (ref 19–32)
Calcium: 9 mg/dL (ref 8.4–10.5)
Creatinine, Ser: 0.62 mg/dL (ref 0.40–1.50)
GFR: 140.3 mL/min (ref >60.00)
GLUCOSE: 104 mg/dL — AB (ref 70–99)
POTASSIUM: 3.3 meq/L — AB (ref 3.5–5.1)
Sodium: 126 mEq/L — ABNORMAL LOW (ref 135–145)

## 2016-10-15 MED ORDER — PREDNISONE 10 MG PO TABS: ORAL_TABLET | ORAL | 0 refills | Status: DC

## 2016-10-15 MED ORDER — DOXYCYCLINE HYCLATE 100 MG PO CAPS: 100.0000 mg | ORAL_CAPSULE | Freq: Two times a day (BID) | ORAL | 0 refills | Status: DC

## 2016-10-15 NOTE — Progress Notes (Signed)
Subjective:    Patient ID: Sean Rose, male    DOB: 09/18/1955, 61 y.o.   MRN: [id number]  HPI   61 year old male who  has a past medical history of Allergy; CAD (coronary artery disease); GERD (gastroesophageal reflux disease); Hemorrhoids; History of anal fissures; Hyperlipidemia; Hypertension; Peripheral arterial disease ([place]); and Tobacco abuse. He reports that 5 days ago he woke up feeling faint, that day he had at least one syncopal episode and possibly two others?. Since that time he continues to feel weak and as though " I am one step away from passing out."  That same morning  he started with chest congestion, productive cough, sinus pain and pressure, and sore throat with low grade fever. He continues to have these symptoms.   Review of Systems  All other systems reviewed and are negative.   See HPI   Past Medical History:  Diagnosis Date  . Allergy    seasonal allergies  . CAD (coronary artery disease)   . GERD (gastroesophageal reflux disease)   . Hemorrhoids   . History of anal fissures   . Hyperlipidemia   . Hypertension   . Peripheral arterial disease ([place])    status post right common iliac artery stenting back in 2007  . Tobacco abuse     Social History   Social History  . Marital status: Married    Spouse name: N/A  . Number of children: N/A  . Years of education: N/A   Occupational History  . Not on file.   Social History Main Topics  . Smoking status: Current Every Day Smoker    Packs/day: 1.00    Types: Cigarettes  . Smokeless tobacco: Never Used     Comment: smoked age 52-present , up to 2 ppd. 12/24/12 < 1 ppd  . Alcohol use 21.0 oz/week    35 Cans of beer per week     Comment:  14 drinks/ week  . Drug use: No  . Sexual activity: Not on file   Other Topics Concern  . Not on file   Social History Narrative  . No narrative on file    Past Surgical History:  Procedure Laterality Date  . CARDIAC CATHETERIZATION  09/23/2008   Continued  medical therapy - may need GI evaluation in addition.  Marland Kitchen CARDIAC CATHETERIZATION  10/28/2007   Medical therapy recommended.  Marland Kitchen CARDIAC CATHETERIZATION  11/18/2006   In-stent restenosis RCA  (50% distal edge, 80% segmental mid, and 50-60% segmental proximal). Successful cutting balloon atherectomy using a 325X15 cutting balloon. 3 inflations with atherectomy performed on mid and proximal portions resulting in reduction of 80% mid in-stent restenosis to less than 20% residual and 50-60% segmental proximal to less than 20% residual without dissection.  Marland Kitchen CARDIAC CATHETERIZATION  02/26/2006   Severe stenosis in RCA. Stenting performed using IVUS. 3.5x20 Maverick balloon deployed at [facility]. Distal stent-a 4x28 Liberte stent-deployed 12atm 48sec, 12atm 31sec, 4atm 19sec. Mid stent-a 4x28 Liberte stent-deployed 14atm 45sec, 14atm 60sec, 14atm 44sec. Proximal stent-4x8 Liberte- 14atm 45sec,14atm 47sec, 16atm 43sec. Severely diseased segment then appeared TIMI-3 flow.  Marland Kitchen CARDIOVASCULAR STRESS TEST  11/17/2012   No significant ECG changes. Septal perfusion defect is new when complared to study from 2010. Abnormal myocardial perfusion imaging with a basal to mid perfusion suggestive of previous MI.  Marland Kitchen CAROTID DOPPLER  08/09/2011   Bilateral Bulb/Proximal ICA - demonstrated a mild amount of fibrous plaque without evidence of significant diameter reduction reduction or other  vascular abnormality.  . COLONOSCOPY     2003, 2014  . CORONARY STENT PLACEMENT  2007 & 2009  . FEMORAL ARTERY STENT    . LOWER EXTREMITY ARTERIAL DOPPLER  01/31/2011   Bilateral ABIs-normal values with no suggestion of arterial insuff to the lower extremities at rest. Right CIA stent-mild amount of nonhemodynamically significant plaque is noted throughout  . Ortho surgeries     8 different surgeries  . PERCUTANEOUS STENT INTERVENTION  04/04/2006 & 04/13/2015   a. Right common iliac artery with an 8.0x18 mm Herculink stent deployed at 12 atm.  Stenosis was reduced from 80% to 0% with brisk flow. b. I-cast stenting to left common iliac artery  . PERIPHERAL VASCULAR CATHETERIZATION N/A 04/13/2015   Procedure: Lower Extremity Angiography;  Surgeon: Lorretta Harp, MD; L-oCIA 75%, 40-50% L-EIA, R-CIA stent patent, s/p 8 mm x 38 mm ICast covered stent>>0% stenosis in L-oCIA     . TRANSTHORACIC ECHOCARDIOGRAM  11/26/2012   EF not noted. Aortic valve-sclerosis without stenosis, no regurgiation.   Marland Kitchen UPPER GASTROINTESTINAL ENDOSCOPY    . US CAROTID DOPPLER BILATERAL ([place] HX)  08/09/2011   Bilateral Bulb/Proximal ICAa demonstrated a mild amount of fibrous plaque without evidence of significant diameter reduction or any other vascular abnormality.    Family History  Problem Relation Age of Onset  . Colon cancer Mother   . Heart disease Father   . Heart disease Paternal Grandfather     Allergies  Allergen Reactions  . Tylox [Oxycodone-Acetaminophen] Nausea Only    Current Outpatient Prescriptions on File Prior to Visit  Medication Sig Dispense Refill  . amLODipine (NORVASC) 5 MG tablet Take 1 tablet (5 mg total) by mouth daily. 30 tablet 11  . aspirin EC 81 MG tablet Take 81 mg by mouth daily.    . clopidogrel (PLAVIX) 75 MG tablet Take 1 tablet (75 mg total) by mouth daily. 30 tablet 11  . esomeprazole (NEXIUM) 40 MG capsule Take 40 mg by mouth daily.     . fexofenadine (ALLEGRA) 180 MG tablet Take 180 mg by mouth daily.    . metoprolol succinate (TOPROL-XL) 100 MG 24 hr tablet Take 1 tablet (100 mg total) by mouth daily. 30 tablet 11  . Multiple Vitamin (MULTIVITAMIN WITH MINERALS) TABS tablet Take 1 tablet by mouth daily.    . nitroGLYCERIN (NITROSTAT) 0.4 MG SL tablet Place 1 tablet (0.4 mg total) under the tongue every 5 (five) minutes as needed for chest pain. 25 tablet 3  . valsartan (DIOVAN) 160 MG tablet Take 1 tablet (160 mg total) by mouth daily. PLEASE SCHEDULE APPOINTMENT 30 tablet 0  . meloxicam (MOBIC) 7.5 MG tablet Take  1 tablet (7.5 mg total) by mouth daily. (Patient not taking: Reported on 10/15/2016) 30 tablet 0   No current facility-administered medications on file prior to visit.     BP 122/64 (BP Location: Left Arm, Patient Position: Sitting, Cuff Size: Normal)   Pulse 70   Temp 98.4 F (36.9 C) (Oral)   Ht '5\' 9"'  (1.753 m)   Wt 124 lb 12.8 oz (56.6 kg)   BMI 18.43 kg/m         Objective:   Physical Exam  Constitutional: He is oriented to person, place, and time. He appears well-developed and well-nourished. No distress.  HENT:  Head: Normocephalic and atraumatic.  Right Ear: Tympanic membrane, external ear and ear canal normal. No decreased hearing is noted.  Left Ear: Hearing, tympanic membrane, external ear and ear  canal normal.  Nose: Mucosal edema and rhinorrhea present. Right sinus exhibits maxillary sinus tenderness and frontal sinus tenderness. Left sinus exhibits maxillary sinus tenderness and frontal sinus tenderness.  Mouth/Throat: Uvula is midline, oropharynx is clear and moist and mucous membranes are normal. No oropharyngeal exudate.  Eyes: Conjunctivae and EOM are normal. Pupils are equal, round, and reactive to light. Right eye exhibits no discharge. Left eye exhibits no discharge. No scleral icterus.  Neck: Normal range of motion. Neck supple. No JVD present. No tracheal deviation present. No thyromegaly present.  Cardiovascular: Normal rate, regular rhythm, normal heart sounds and intact distal pulses.  Exam reveals no gallop and no friction rub.   No murmur heard. Pulmonary/Chest: No stridor. No respiratory distress. He has wheezes. He has no rhonchi. He has no rales. He exhibits no tenderness.  Musculoskeletal: He exhibits no edema, tenderness or deformity.  Lymphadenopathy:    He has no cervical adenopathy.  Neurological: He is alert and oriented to person, place, and time.  Skin: Skin is warm and dry. No rash noted. He is not diaphoretic. No erythema. No pallor.    Psychiatric: He has a normal mood and affect. His behavior is normal. Judgment and thought content normal.  Nursing note and vitals reviewed.     Assessment & Plan:  1. Syncope, unspecified syncope type - possibly related to URI? He does have an extensive Vascular disease. Will get ultrasound of carotids as well as advised to follow-up with his cardiologist Dr. Alvester Chou as soon as possible - EKG 12-Lead- Sinus  Rhythm  -Short PR syndrome  PRi = 10, Rate 71. Acute EKG with Dr. Elease Hashimoto. He agrees that this EKG is nondiagnostic -ST depression  -Nondiagnostic -possible digitalis effect, -consider subendocardial injury/ischemia.  - DG Chest 2 View; Future - CBC with Differential/Platelet - Basic metabolic panel - VAS US CAROTID; Future  2. Upper respiratory tract infection, unspecified type - DG Chest 2 View; Future - CBC with Differential/Platelet - Basic metabolic panel - doxycycline (VIBRAMYCIN) 100 MG capsule; Take 1 capsule (100 mg total) by mouth 2 (two) times daily.  Dispense: 14 capsule; Refill: 0 - predniSONE (DELTASONE) 10 MG tablet; 40 mg x 3 days, 20 mg x 3 days, 10 mg x 3 days  Dispense: 21 tablet; Refill: 0 - Follow up if no improvement  [name], NP

## 2016-10-16 NOTE — Addendum Note (Signed)
Addended by: Apolinar Junes on: 10/16/2016 06:55 AM   Modules accepted: Orders

## 2016-10-28 DIAGNOSIS — M542 Cervicalgia: Secondary | ICD-10-CM | POA: Diagnosis not present

## 2016-10-30 ENCOUNTER — Emergency Department (HOSPITAL_BASED_OUTPATIENT_CLINIC_OR_DEPARTMENT_OTHER): Payer: BLUE CROSS/BLUE SHIELD

## 2016-10-30 ENCOUNTER — Observation Stay (HOSPITAL_BASED_OUTPATIENT_CLINIC_OR_DEPARTMENT_OTHER)
Admission: EM | Admit: 2016-10-30 | Discharge: 2016-10-31 | Disposition: A | Discharge status: Home / Self Care | Payer: BLUE CROSS/BLUE SHIELD | Attending: Family Medicine | Admitting: Family Medicine

## 2016-10-30 ENCOUNTER — Telehealth: Payer: Self-pay | Admitting: Adult Health

## 2016-10-30 ENCOUNTER — Encounter (HOSPITAL_COMMUNITY): Payer: Self-pay | Admitting: Family Medicine

## 2016-10-30 ENCOUNTER — Encounter: Payer: Self-pay | Admitting: Adult Health

## 2016-10-30 ENCOUNTER — Ambulatory Visit (INDEPENDENT_AMBULATORY_CARE_PROVIDER_SITE_OTHER): Payer: BLUE CROSS/BLUE SHIELD | Admitting: Adult Health

## 2016-10-30 VITALS — BP 112/64 | Temp 98.3°F | Ht 69.0 in | Wt 128.0 lb

## 2016-10-30 DIAGNOSIS — R112 Nausea with vomiting, unspecified: Secondary | ICD-10-CM | POA: Diagnosis not present

## 2016-10-30 DIAGNOSIS — Z886 Allergy status to analgesic agent status: Secondary | ICD-10-CM | POA: Insufficient documentation

## 2016-10-30 DIAGNOSIS — Z800 Family history of malignant neoplasm of digestive organs: Secondary | ICD-10-CM | POA: Insufficient documentation

## 2016-10-30 DIAGNOSIS — Z955 Presence of coronary angioplasty implant and graft: Secondary | ICD-10-CM | POA: Diagnosis not present

## 2016-10-30 DIAGNOSIS — I70219 Atherosclerosis of native arteries of extremities with intermittent claudication, unspecified extremity: Secondary | ICD-10-CM | POA: Diagnosis not present

## 2016-10-30 DIAGNOSIS — E860 Dehydration: Secondary | ICD-10-CM | POA: Insufficient documentation

## 2016-10-30 DIAGNOSIS — D649 Anemia, unspecified: Secondary | ICD-10-CM | POA: Insufficient documentation

## 2016-10-30 DIAGNOSIS — Z7982 Long term (current) use of aspirin: Secondary | ICD-10-CM | POA: Insufficient documentation

## 2016-10-30 DIAGNOSIS — E871 Hypo-osmolality and hyponatremia: Secondary | ICD-10-CM | POA: Diagnosis not present

## 2016-10-30 DIAGNOSIS — Z8249 Family history of ischemic heart disease and other diseases of the circulatory system: Secondary | ICD-10-CM | POA: Insufficient documentation

## 2016-10-30 DIAGNOSIS — Z7901 Long term (current) use of anticoagulants: Secondary | ICD-10-CM | POA: Diagnosis not present

## 2016-10-30 DIAGNOSIS — K529 Noninfective gastroenteritis and colitis, unspecified: Secondary | ICD-10-CM

## 2016-10-30 DIAGNOSIS — K029 Dental caries, unspecified: Secondary | ICD-10-CM | POA: Diagnosis not present

## 2016-10-30 DIAGNOSIS — K219 Gastro-esophageal reflux disease without esophagitis: Secondary | ICD-10-CM | POA: Insufficient documentation

## 2016-10-30 DIAGNOSIS — A0839 Other viral enteritis: Principal | ICD-10-CM | POA: Insufficient documentation

## 2016-10-30 DIAGNOSIS — J449 Chronic obstructive pulmonary disease, unspecified: Secondary | ICD-10-CM | POA: Diagnosis not present

## 2016-10-30 DIAGNOSIS — K222 Esophageal obstruction: Secondary | ICD-10-CM | POA: Insufficient documentation

## 2016-10-30 DIAGNOSIS — R011 Cardiac murmur, unspecified: Secondary | ICD-10-CM | POA: Diagnosis not present

## 2016-10-30 DIAGNOSIS — F17210 Nicotine dependence, cigarettes, uncomplicated: Secondary | ICD-10-CM | POA: Insufficient documentation

## 2016-10-30 DIAGNOSIS — R197 Diarrhea, unspecified: Secondary | ICD-10-CM | POA: Insufficient documentation

## 2016-10-30 DIAGNOSIS — K645 Perianal venous thrombosis: Secondary | ICD-10-CM | POA: Insufficient documentation

## 2016-10-30 DIAGNOSIS — E876 Hypokalemia: Secondary | ICD-10-CM | POA: Insufficient documentation

## 2016-10-30 DIAGNOSIS — Z7902 Long term (current) use of antithrombotics/antiplatelets: Secondary | ICD-10-CM | POA: Insufficient documentation

## 2016-10-30 DIAGNOSIS — I2510 Atherosclerotic heart disease of native coronary artery without angina pectoris: Secondary | ICD-10-CM | POA: Diagnosis not present

## 2016-10-30 DIAGNOSIS — I700 Atherosclerosis of aorta: Secondary | ICD-10-CM | POA: Insufficient documentation

## 2016-10-30 DIAGNOSIS — E785 Hyperlipidemia, unspecified: Secondary | ICD-10-CM | POA: Diagnosis not present

## 2016-10-30 DIAGNOSIS — I10 Essential (primary) hypertension: Secondary | ICD-10-CM | POA: Insufficient documentation

## 2016-10-30 DIAGNOSIS — R05 Cough: Secondary | ICD-10-CM | POA: Diagnosis not present

## 2016-10-30 DIAGNOSIS — Z79899 Other long term (current) drug therapy: Secondary | ICD-10-CM | POA: Insufficient documentation

## 2016-10-30 HISTORY — DX: Angina pectoris, unspecified: I20.9

## 2016-10-30 HISTORY — DX: Pneumonia, unspecified organism: J18.9

## 2016-10-30 HISTORY — DX: Chronic obstructive pulmonary disease, unspecified: J44.9

## 2016-10-30 HISTORY — DX: Cardiac murmur, unspecified: R01.1

## 2016-10-30 HISTORY — DX: Other seasonal allergic rhinitis: J30.2

## 2016-10-30 LAB — COMPREHENSIVE METABOLIC PANEL
ALBUMIN: 3.3 g/dL — AB (ref 3.5–5.0)
ALK PHOS: 48 U/L (ref 38–126)
ALT: 19 U/L (ref 17–63)
ANION GAP: 9 (ref 5–15)
AST: 34 U/L (ref 15–41)
BILIRUBIN TOTAL: 0.5 mg/dL (ref 0.3–1.2)
BUN: 7 mg/dL (ref 6–20)
CALCIUM: 6.8 mg/dL — AB (ref 8.9–10.3)
CO2: 25 mmol/L (ref 22–32)
Chloride: 94 mmol/L — ABNORMAL LOW (ref 101–111)
Creatinine, Ser: 0.91 mg/dL (ref 0.61–1.24)
GFR calc Af Amer: >60 mL/min (ref >60)
GFR calc non Af Amer: >60 mL/min (ref >60)
GLUCOSE: 115 mg/dL — AB (ref 65–99)
Potassium: 1.8 mmol/L — CL (ref 3.5–5.1)
Sodium: 128 mmol/L — ABNORMAL LOW (ref 135–145)
TOTAL PROTEIN: 6 g/dL — AB (ref 6.5–8.1)

## 2016-10-30 LAB — BASIC METABOLIC PANEL
Anion gap: 8 (ref 5–15)
BUN: 6 mg/dL (ref 6–23)
BUN: <5 mg/dL — ABNORMAL LOW (ref 6–20)
CALCIUM: 6 mg/dL — AB (ref 8.9–10.3)
CHLORIDE: 94 meq/L — AB (ref 96–112)
CO2: 28 mEq/L (ref 19–32)
CO2: 21 mmol/L — ABNORMAL LOW (ref 22–32)
CREATININE: 0.67 mg/dL (ref 0.61–1.24)
Calcium: 7.6 mg/dL — ABNORMAL LOW (ref 8.4–10.5)
Chloride: 101 mmol/L (ref 101–111)
Creatinine, Ser: 0.76 mg/dL (ref 0.40–1.50)
GFR calc non Af Amer: >60 mL/min (ref >60)
GFR: 110.91 mL/min (ref >60.00)
Glucose, Bld: 112 mg/dL — ABNORMAL HIGH (ref 70–99)
Glucose, Bld: 97 mg/dL (ref 65–99)
POTASSIUM: 2.7 meq/L — AB (ref 3.5–5.1)
Potassium: 2.4 mmol/L — CL (ref 3.5–5.1)
SODIUM: 131 meq/L — AB (ref 135–145)
Sodium: 130 mmol/L — ABNORMAL LOW (ref 135–145)

## 2016-10-30 LAB — CBC WITH DIFFERENTIAL/PLATELET
Basophils Absolute: 0 10*3/uL (ref 0.0–0.1)
Basophils Relative: 0 %
Eosinophils Absolute: 0 10*3/uL (ref 0.0–0.7)
Eosinophils Relative: 0 %
HEMATOCRIT: 33 % — AB (ref 39.0–52.0)
HEMOGLOBIN: 12.2 g/dL — AB (ref 13.0–17.0)
LYMPHS ABS: 1 10*3/uL (ref 0.7–4.0)
LYMPHS PCT: 14 %
MCH: 36.6 pg — AB (ref 26.0–34.0)
MCHC: 37 g/dL — ABNORMAL HIGH (ref 30.0–36.0)
MCV: 99.1 fL (ref 78.0–100.0)
Monocytes Absolute: 0.6 10*3/uL (ref 0.1–1.0)
Monocytes Relative: 8 %
NEUTROS ABS: 5.8 10*3/uL (ref 1.7–7.7)
NEUTROS PCT: 78 %
Platelets: 289 10*3/uL (ref 150–400)
RBC: 3.33 MIL/uL — AB (ref 4.22–5.81)
RDW: 12.7 % (ref 11.5–15.5)
WBC: 7.4 10*3/uL (ref 4.0–10.5)

## 2016-10-30 LAB — MAGNESIUM
MAGNESIUM: 0.5 mg/dL — AB (ref 1.5–2.5)
MAGNESIUM: 0.5 mg/dL — AB (ref 1.7–2.4)
MAGNESIUM: 1.1 mg/dL — AB (ref 1.7–2.4)

## 2016-10-30 LAB — TROPONIN I: Troponin I: <0.03 ng/mL (ref <0.03)

## 2016-10-30 LAB — URINALYSIS, ROUTINE W REFLEX MICROSCOPIC
Glucose, UA: 100 mg/dL — AB
Hgb urine dipstick: NEGATIVE
KETONES UR: 15 mg/dL — AB
NITRITE: NEGATIVE
PH: 6 (ref 5.0–8.0)
Protein, ur: 100 mg/dL — AB
Specific Gravity, Urine: 1.02 (ref 1.005–1.030)

## 2016-10-30 LAB — LIPASE, BLOOD: Lipase: 25 U/L (ref 11–51)

## 2016-10-30 LAB — URINALYSIS, MICROSCOPIC (REFLEX)

## 2016-10-30 LAB — C DIFFICILE QUICK SCREEN W PCR REFLEX
C DIFFICILE (CDIFF) TOXIN: NEGATIVE
C DIFFICLE (CDIFF) ANTIGEN: NEGATIVE
C Diff interpretation: NOT DETECTED

## 2016-10-30 MED ORDER — POTASSIUM CHLORIDE 10 MEQ/100ML IV SOLN
10.0000 meq | INTRAVENOUS | Status: AC
  Administered 2016-10-30 (×2): 10 meq via INTRAVENOUS
  Filled 2016-10-30 (×2): qty 100

## 2016-10-30 MED ORDER — ASPIRIN EC 81 MG PO TBEC
81.0000 mg | DELAYED_RELEASE_TABLET | Freq: Every day | ORAL | Status: DC
  Filled 2016-10-30: qty 1

## 2016-10-30 MED ORDER — LORATADINE 10 MG PO TABS
10.0000 mg | ORAL_TABLET | Freq: Every day | ORAL | Status: DC
  Filled 2016-10-30: qty 1

## 2016-10-30 MED ORDER — ONDANSETRON HCL 4 MG/2ML IJ SOLN: 4.0000 mg | Freq: Four times a day (QID) | INTRAMUSCULAR | Status: DC | PRN

## 2016-10-30 MED ORDER — SODIUM CHLORIDE 0.9 % IV BOLUS (SEPSIS)
1000.0000 mL | Freq: Once | INTRAVENOUS | Status: AC
  Administered 2016-10-30: 1000 mL via INTRAVENOUS

## 2016-10-30 MED ORDER — POTASSIUM CHLORIDE 10 MEQ/100ML IV SOLN
10.0000 meq | Freq: Once | INTRAVENOUS | Status: AC
  Administered 2016-10-30: 10 meq via INTRAVENOUS
  Filled 2016-10-30: qty 100

## 2016-10-30 MED ORDER — ACETAMINOPHEN 650 MG RE SUPP: 650.0000 mg | Freq: Four times a day (QID) | RECTAL | Status: DC | PRN

## 2016-10-30 MED ORDER — POTASSIUM CHLORIDE IN NACL 40-0.9 MEQ/L-% IV SOLN
INTRAVENOUS | Status: AC
  Administered 2016-10-30: 100 mL/h via INTRAVENOUS
  Filled 2016-10-30: qty 1000

## 2016-10-30 MED ORDER — ENOXAPARIN SODIUM 40 MG/0.4ML ~~LOC~~ SOLN
40.0000 mg | SUBCUTANEOUS | Status: DC
  Administered 2016-10-30: 40 mg via SUBCUTANEOUS
  Filled 2016-10-30: qty 0.4

## 2016-10-30 MED ORDER — MAGNESIUM SULFATE 2 GM/50ML IV SOLN
2.0000 g | Freq: Once | INTRAVENOUS | Status: AC
  Administered 2016-10-30: 2 g via INTRAVENOUS
  Filled 2016-10-30: qty 50

## 2016-10-30 MED ORDER — POTASSIUM CHLORIDE CRYS ER 20 MEQ PO TBCR
40.0000 meq | EXTENDED_RELEASE_TABLET | ORAL | Status: AC
  Administered 2016-10-30 – 2016-10-31 (×2): 40 meq via ORAL
  Filled 2016-10-30 (×2): qty 2

## 2016-10-30 MED ORDER — ONDANSETRON HCL 4 MG PO TABS: 4.0000 mg | ORAL_TABLET | Freq: Three times a day (TID) | ORAL | 0 refills | Status: DC | PRN

## 2016-10-30 MED ORDER — POTASSIUM CHLORIDE CRYS ER 20 MEQ PO TBCR
40.0000 meq | EXTENDED_RELEASE_TABLET | Freq: Once | ORAL | Status: AC
  Administered 2016-10-30: 40 meq via ORAL
  Filled 2016-10-30: qty 2

## 2016-10-30 MED ORDER — HYDROCODONE-ACETAMINOPHEN 5-325 MG PO TABS: 1.0000 | ORAL_TABLET | ORAL | Status: DC | PRN

## 2016-10-30 MED ORDER — METOPROLOL SUCCINATE ER 100 MG PO TB24
100.0000 mg | ORAL_TABLET | Freq: Every day | ORAL | Status: DC
  Filled 2016-10-30: qty 1

## 2016-10-30 MED ORDER — ONDANSETRON HCL 4 MG/2ML IJ SOLN
4.0000 mg | Freq: Once | INTRAMUSCULAR | Status: AC
  Administered 2016-10-30: 4 mg via INTRAVENOUS
  Filled 2016-10-30: qty 2

## 2016-10-30 MED ORDER — ONDANSETRON HCL 4 MG/2ML IJ SOLN
4.0000 mg | Freq: Once | INTRAMUSCULAR | Status: AC
  Administered 2016-10-30: 4 mg via INTRAMUSCULAR

## 2016-10-30 MED ORDER — PANTOPRAZOLE SODIUM 40 MG PO TBEC
40.0000 mg | DELAYED_RELEASE_TABLET | Freq: Every day | ORAL | Status: DC
  Filled 2016-10-30: qty 1

## 2016-10-30 MED ORDER — CLOPIDOGREL BISULFATE 75 MG PO TABS
75.0000 mg | ORAL_TABLET | Freq: Every day | ORAL | Status: DC
  Filled 2016-10-30: qty 1

## 2016-10-30 MED ORDER — ADULT MULTIVITAMIN W/MINERALS CH
1.0000 | ORAL_TABLET | Freq: Every day | ORAL | Status: DC
  Filled 2016-10-30: qty 1

## 2016-10-30 MED ORDER — PROMETHAZINE HCL 25 MG/ML IJ SOLN: 12.5000 mg | Freq: Four times a day (QID) | INTRAMUSCULAR | Status: DC | PRN

## 2016-10-30 MED ORDER — ACETAMINOPHEN 325 MG PO TABS: 650.0000 mg | ORAL_TABLET | Freq: Four times a day (QID) | ORAL | Status: DC | PRN

## 2016-10-30 MED ORDER — ENSURE ENLIVE PO LIQD: 237.0000 mL | Freq: Two times a day (BID) | ORAL | Status: DC

## 2016-10-30 MED ORDER — SODIUM CHLORIDE 0.9% FLUSH: 3.0000 mL | Freq: Two times a day (BID) | INTRAVENOUS | Status: DC

## 2016-10-30 NOTE — ED Notes (Signed)
EDP aware of critical mag level.

## 2016-10-30 NOTE — Patient Instructions (Addendum)
Food Choices to Help Relieve Diarrhea, Adult When you have diarrhea, the foods you eat and your eating habits are very important. Choosing the right foods and drinks can help:  Relieve diarrhea.  Replace lost fluids and nutrients.  Prevent dehydration.  What general guidelines should I follow? Relieving diarrhea  Choose foods with less than 2 g or .07 oz. of fiber per serving.  Limit fats to less than 8 tsp (38 g or 1.34 oz.) a day.  Avoid the following: ? Foods and beverages sweetened with high-fructose corn syrup, honey, or sugar alcohols such as xylitol, sorbitol, and mannitol. ? Foods that contain a lot of fat or sugar. ? Fried, greasy, or spicy foods. ? High-fiber grains, breads, and cereals. ? Raw fruits and vegetables.  Eat foods that are rich in probiotics. These foods include dairy products such as yogurt and fermented milk products. They help increase healthy bacteria in the stomach and intestines (gastrointestinal tract, or GI tract).  If you have lactose intolerance, avoid dairy products. These may make your diarrhea worse.  Take medicine to help stop diarrhea (antidiarrheal medicine) only as told by your health care provider. Replacing nutrients  Eat small meals or snacks every 3-4 hours.  Eat bland foods, such as white rice, toast, or baked potato, until your diarrhea starts to get better. Gradually reintroduce nutrient-rich foods as tolerated or as told by your health care provider. This includes: ? Well-cooked protein foods. ? Peeled, seeded, and soft-cooked fruits and vegetables. ? Low-fat dairy products.  Take vitamin and mineral supplements as told by your health care provider. Preventing dehydration   Start by sipping water or a special solution to prevent dehydration (oral rehydration solution, ORS). Urine that is clear or pale yellow means that you are getting enough fluid.  Try to drink at least 8-10 cups of fluid each day to help replace lost  fluids.  You may add other liquids in addition to water, such as clear juice or decaffeinated sports drinks, as tolerated or as told by your health care provider.  Avoid drinks with caffeine, such as coffee, tea, or soft drinks.  Avoid alcohol. What foods are recommended? The items listed may not be a complete list. Talk with your health care provider about what dietary choices are best for you. Grains White rice. White, French, or pita breads (fresh or toasted), including plain rolls, buns, or bagels. White pasta. Saltine, soda, or graham crackers. Pretzels. Low-fiber cereal. Cooked cereals made with water (such as cornmeal, farina, or cream cereals). Plain muffins. Matzo. Melba toast. Zwieback. Vegetables Potatoes (without the skin). Most well-cooked and canned vegetables without skins or seeds. Tender lettuce. Fruits Apple sauce. Fruits canned in juice. Cooked apricots, cherries, grapefruit, peaches, pears, or plums. Fresh bananas and cantaloupe. Meats and other protein foods Baked or boiled chicken. Eggs. Tofu. Fish. Seafood. Smooth nut butters. Ground or well-cooked tender beef, ham, veal, lamb, pork, or poultry. Dairy Plain yogurt, kefir, and unsweetened liquid yogurt. Lactose-free milk, buttermilk, skim milk, or soy milk. Low-fat or nonfat hard cheese. Beverages Water. Low-calorie sports drinks. Fruit juices without pulp. Strained tomato and vegetable juices. Decaffeinated teas. Sugar-free beverages not sweetened with sugar alcohols. Oral rehydration solutions, if approved by your health care provider. Seasoning and other foods Bouillon, broth, or soups made from recommended foods. What foods are not recommended? The items listed may not be a complete list. Talk with your health care provider about what dietary choices are best for you. Grains Whole grain, whole wheat,   bran, or rye breads, rolls, pastas, and crackers. Wild or brown rice. Whole grain or bran cereals. Barley. Oats and  oatmeal. Corn tortillas or taco shells. Granola. Popcorn. Vegetables Raw vegetables. Fried vegetables. Cabbage, broccoli, Brussels sprouts, artichokes, baked beans, beet greens, corn, kale, legumes, peas, sweet potatoes, and yams. Potato skins. Cooked spinach and cabbage. Fruits Dried fruit, including raisins and dates. Raw fruits. Stewed or dried prunes. Canned fruits with syrup. Meat and other protein foods Fried or fatty meats. Deli meats. Chunky nut butters. Nuts and seeds. Beans and lentils. Bacon. Hot dogs. Sausage. Dairy High-fat cheeses. Whole milk, chocolate milk, and beverages made with milk, such as milk shakes. Half-and-half. Cream. sour cream. Ice cream. Beverages Caffeinated beverages (such as coffee, tea, soda, or energy drinks). Alcoholic beverages. Fruit juices with pulp. Prune juice. Soft drinks sweetened with high-fructose corn syrup or sugar alcohols. High-calorie sports drinks. Fats and oils Butter. Cream sauces. Margarine. Salad oils. Plain salad dressings. Olives. Avocados. Mayonnaise. Sweets and desserts Sweet rolls, doughnuts, and sweet breads. Sugar-free desserts sweetened with sugar alcohols such as xylitol and sorbitol. Seasoning and other foods Honey. Hot sauce. Chili powder. Gravy. Cream-based or milk-based soups. Pancakes and waffles. Summary  When you have diarrhea, the foods you eat and your eating habits are very important.  Make sure you get at least 8-10 cups of fluid each day, or enough to keep your urine clear or pale yellow.  Eat bland foods and gradually reintroduce healthy, nutrient-rich foods as tolerated, or as told by your health care provider.  Avoid high-fiber, fried, greasy, or spicy foods. This information is not intended to replace advice given to you by your health care provider. Make sure you discuss any questions you have with your health care provider. Document Released: 10/05/2003 Document Revised: 07/12/2016 Document Reviewed:  07/12/2016 Elsevier Interactive Patient Education  2017 [employer] Inc.  

## 2016-10-30 NOTE — ED Provider Notes (Signed)
[facility]PT [facility] Provider Note   CSN: [id number] Arrival date & time: 10/30/16  1210     History   Chief Complaint Chief Complaint  Patient presents with  . Vomiting    HPI Sean Rose is a 61 y.o. male who presents with nausea/vomiting and diarrhea that began yesterday. Patient reports feeling generalized weakness when he woke up yesterday morning. Patient reports that around 1200 he began vomiting and had TNTC subsequent episodes throughout the day. He had difficulty tolerating any PO yesterday. He reports that emesis is NBNB. He also reports multiple episodes of watery, diarrhea. He denies any hematochezia or black tarry stools. He reports associated subjective fever and chills. He was seen by his PCP today who did lab work that showed hypokalemia and hypomagnesia and was sent to the ED for further eval. Patient was recently with Doxycycline for pneumonia last week. He denies any abnormal or unusual food. He denies any recent travel.   The history is provided by the patient.    Past Medical History:  Diagnosis Date  . Allergy    seasonal allergies  . CAD (coronary artery disease)   . GERD (gastroesophageal reflux disease)   . Hemorrhoids   . History of anal fissures   . Hyperlipidemia   . Hypertension   . Peripheral arterial disease ([place])    status post right common iliac artery stenting back in 2007  . Tobacco abuse     Patient Active Problem List   Diagnosis Date Noted  . Hypokalemia 04/14/2015  . Hypomagnesemia 04/14/2015  . Claudication ([place]) 04/13/2015  . Essential hypertension 01/14/2014  . Hyperlipidemia 01/14/2014  . Thrombosed external hemorrhoids 12/07/2013  . Chronic anticoagulation 01/20/2013  . PVD (peripheral vascular disease) ([place]) 01/20/2013  . Coronary atherosclerosis of native coronary artery 01/20/2013  . GERD (gastroesophageal reflux disease) 01/20/2013  . Benign esophageal stricture 01/20/2013  . DENTAL CARIES 07/20/2010    Past  Surgical History:  Procedure Laterality Date  . CARDIAC CATHETERIZATION  09/23/2008   Continued medical therapy - may need GI evaluation in addition.  Marland Kitchen CARDIAC CATHETERIZATION  10/28/2007   Medical therapy recommended.  Marland Kitchen CARDIAC CATHETERIZATION  11/18/2006   In-stent restenosis RCA  (50% distal edge, 80% segmental mid, and 50-60% segmental proximal). Successful cutting balloon atherectomy using a [id number] cutting balloon. 3 inflations with atherectomy performed on mid and proximal portions resulting in reduction of 80% mid in-stent restenosis to less than 20% residual and 50-60% segmental proximal to less than 20% residual without dissection.  Marland Kitchen CARDIAC CATHETERIZATION  02/26/2006   Severe stenosis in RCA. Stenting performed using IVUS. 3.5x20 Maverick balloon deployed at [employer]. Distal stent-a 4x28 Liberte stent-deployed 12atm 48sec, 12atm 31sec, 4atm 19sec. Mid stent-a 4x28 Liberte stent-deployed 14atm 45sec, 14atm 60sec, 14atm 44sec. Proximal stent-4x8 Liberte- 14atm 45sec,14atm 47sec, 16atm 43sec. Severely diseased segment then appeared TIMI-3 flow.  Marland Kitchen CARDIOVASCULAR STRESS TEST  11/17/2012   No significant ECG changes. Septal perfusion defect is new when complared to study from 2010. Abnormal myocardial perfusion imaging with a basal to mid perfusion suggestive of previous MI.  Marland Kitchen CAROTID DOPPLER  08/09/2011   Bilateral Bulb/Proximal ICA - demonstrated a mild amount of fibrous plaque without evidence of significant diameter reduction reduction or other vascular abnormality.  . COLONOSCOPY     2003, 2014  . CORONARY STENT PLACEMENT  2007 & 2009  . FEMORAL ARTERY STENT    . LOWER EXTREMITY ARTERIAL DOPPLER  01/31/2011   Bilateral ABIs-normal values with no  suggestion of arterial insuff to the lower extremities at rest. Right CIA stent-mild amount of nonhemodynamically significant plaque is noted throughout  . Ortho surgeries     8 different surgeries  . PERCUTANEOUS STENT INTERVENTION  04/04/2006 &  04/13/2015   a. Right common iliac artery with an 8.0x18 mm Herculink stent deployed at 12 atm. Stenosis was reduced from 80% to 0% with brisk flow. b. I-cast stenting to left common iliac artery  . PERIPHERAL VASCULAR CATHETERIZATION N/A 04/13/2015   Procedure: Lower Extremity Angiography;  Surgeon: Lorretta Harp, MD; L-oCIA 75%, 40-50% L-EIA, R-CIA stent patent, s/p 8 mm x 38 mm ICast covered stent>>0% stenosis in L-oCIA     . TRANSTHORACIC ECHOCARDIOGRAM  11/26/2012   EF not noted. Aortic valve-sclerosis without stenosis, no regurgiation.   Marland Kitchen UPPER GASTROINTESTINAL ENDOSCOPY    . US CAROTID DOPPLER BILATERAL ([place] HX)  08/09/2011   Bilateral Bulb/Proximal ICAa demonstrated a mild amount of fibrous plaque without evidence of significant diameter reduction or any other vascular abnormality.       Home Medications    Prior to Admission medications   Medication Sig Start Date End Date Taking? Authorizing Provider  amLODipine (NORVASC) 5 MG tablet Take 1 tablet (5 mg total) by mouth daily. 07/19/16   Lorretta Harp, MD  aspirin EC 81 MG tablet Take 81 mg by mouth daily.    Historical Provider, MD  clopidogrel (PLAVIX) 75 MG tablet Take 1 tablet (75 mg total) by mouth daily. 07/19/16   Lorretta Harp, MD  esomeprazole (NEXIUM) 40 MG capsule Take 40 mg by mouth daily.     Historical Provider, MD  fexofenadine (ALLEGRA) 180 MG tablet Take 180 mg by mouth daily.    Historical Provider, MD  meloxicam (MOBIC) 7.5 MG tablet Take 1 tablet (7.5 mg total) by mouth daily. 10/09/16   [name], NP  metoprolol succinate (TOPROL-XL) 100 MG 24 hr tablet Take 1 tablet (100 mg total) by mouth daily. 07/19/16   Lorretta Harp, MD  Multiple Vitamin (MULTIVITAMIN WITH MINERALS) TABS tablet Take 1 tablet by mouth daily.    Historical Provider, MD  nitroGLYCERIN (NITROSTAT) 0.4 MG SL tablet Place 1 tablet (0.4 mg total) under the tongue every 5 (five) minutes as needed for chest pain. 09/27/15   Lorretta Harp, MD  ondansetron (ZOFRAN) 4 MG tablet Take 1 tablet (4 mg total) by mouth every 8 (eight) hours as needed for nausea or vomiting. 10/30/16   [name], NP  valsartan (DIOVAN) 160 MG tablet Take 1 tablet (160 mg total) by mouth daily. PLEASE SCHEDULE APPOINTMENT 05/21/16   Lorretta Harp, MD    Family History Family History  Problem Relation Age of Onset  . Colon cancer Mother   . Heart disease Father   . Heart disease Paternal Grandfather     Social History Social History  Substance Use Topics  . Smoking status: Current Every Day Smoker    Packs/day: 1.00    Types: Cigarettes  . Smokeless tobacco: Never Used     Comment: smoked age 64-present , up to 2 ppd. 12/24/12 < 1 ppd  . Alcohol use 21.0 oz/week    35 Cans of beer per week     Comment:  14 drinks/ week     Allergies   Tylox [oxycodone-acetaminophen]   Review of Systems Review of Systems  Constitutional: Positive for chills and fever.  HENT: Negative for congestion, rhinorrhea and sore throat.   Eyes: Negative  for visual disturbance.  Respiratory: Negative for cough and shortness of breath.   Cardiovascular: Negative for chest pain.  Gastrointestinal: Positive for diarrhea, nausea and vomiting. Negative for abdominal pain and blood in stool.  Genitourinary: Negative for dysuria and hematuria.  Musculoskeletal: Negative for back pain and neck pain.  Skin: Negative for rash.  Neurological: Positive for weakness. Negative for dizziness, numbness and headaches.  All other systems reviewed and are negative.    Physical Exam Updated Vital Signs BP 121/79   Pulse 64   Temp 98.1 F (36.7 C) (Oral)   Resp 13   Ht 5' 9" (1.753 m)   Wt 58.1 kg   SpO2 100%   BMI 18.90 kg/m   Physical Exam  Constitutional: He is oriented to person, place, and time. He appears well-developed and well-nourished.  HENT:  Head: Normocephalic and atraumatic.  Mouth/Throat: Oropharynx is clear and moist. Mucous membranes are  dry.  Eyes: Conjunctivae, EOM and lids are normal. Pupils are equal, round, and reactive to light.  Neck: Full passive range of motion without pain.  Cardiovascular: Normal rate, regular rhythm, normal heart sounds and normal pulses.  Exam reveals no gallop and no friction rub.   No murmur heard. Pulmonary/Chest: Effort normal. He has rales.  Abdominal: Soft. Normal appearance. There is no tenderness. There is no rigidity and no guarding.  Musculoskeletal: Normal range of motion.  Neurological: He is alert and oriented to person, place, and time.  Skin: Skin is warm and dry. Capillary refill takes less than 2 seconds.  Psychiatric: He has a normal mood and affect. His speech is normal.  Nursing note and vitals reviewed.    ED Treatments / Results  Labs (all labs ordered are listed, but only abnormal results are displayed) Labs Reviewed  CBC WITH DIFFERENTIAL/PLATELET - Abnormal; Notable for the following:       Result Value   RBC 3.33 (*)    Hemoglobin 12.2 (*)    HCT 33.0 (*)    MCH 36.6 (*)    MCHC 37.0 (*)    All other components within normal limits  COMPREHENSIVE METABOLIC PANEL - Abnormal; Notable for the following:    Sodium 128 (*)    Potassium 1.8 (*)    Chloride 94 (*)    Glucose, Bld 115 (*)    Calcium 6.8 (*)    Total Protein 6.0 (*)    Albumin 3.3 (*)    All other components within normal limits  URINALYSIS, ROUTINE W REFLEX MICROSCOPIC - Abnormal; Notable for the following:    Color, Urine AMBER (*)    APPearance CLOUDY (*)    Glucose, UA 100 (*)    Bilirubin Urine SMALL (*)    Ketones, ur 15 (*)    Protein, ur 100 (*)    Leukocytes, UA TRACE (*)    All other components within normal limits  URINALYSIS, MICROSCOPIC (REFLEX) - Abnormal; Notable for the following:    Bacteria, UA RARE (*)    Squamous Epithelial / LPF 0-5 (*)    All other components within normal limits  MAGNESIUM - Abnormal; Notable for the following:    Magnesium 0.5 (*)    All other  components within normal limits  LIPASE, BLOOD  TROPONIN I    EKG  EKG Interpretation  Date/Time:  Wednesday October 30 2016 12:51:02 EDT Ventricular Rate:  63 PR Interval:    QRS Duration: 101 QT Interval:  420 QTC Calculation: 430 R Axis:   73 Text  Interpretation:  Sinus rhythm Short PR interval Posterior infarct, old Borderline ST depression, anterolateral leads Baseline wander in lead(s) V2 V5 No significant change since last tracing Confirmed by YAO  MD, DAVID ([id number]) on 10/30/2016 12:55:55 PM       Radiology Dg Chest 2 View  Result Date: 10/30/2016 CLINICAL DATA:  Vomiting, diarrhea, dehydration, cough, and fever since yesterday. History of COPD, current smoker, coronary artery disease with stent placement. EXAM: CHEST  2 VIEW COMPARISON:  PA and lateral chest x-ray of October 15, 2016 FINDINGS: The lungs are mildly hyperinflated. There is no focal infiltrate. There is no pleural effusion. The heart and pulmonary vascularity are normal. There is calcification in the wall of the aortic arch and descending thoracic aorta. The bony thorax is unremarkable. IMPRESSION: Mild hyperinflation consistent with known COPD. No pneumonia nor CHF. Thoracic aortic atherosclerosis. Electronically Signed   By: David  Martinique M.D.   On: 10/30/2016 13:28    Procedures Procedures (including critical care time)  Medications Ordered in ED Medications  potassium chloride 10 mEq in 100 mL IVPB (10 mEq Intravenous New Bag/Given 10/30/16 1634)  sodium chloride 0.9 % bolus 1,000 mL (0 mLs Intravenous Stopped 10/30/16 1400)  ondansetron (ZOFRAN) injection 4 mg (4 mg Intravenous Given 10/30/16 1302)  potassium chloride SA (K-DUR,KLOR-CON) CR tablet 40 mEq (40 mEq Oral Given 10/30/16 1301)  potassium chloride 10 mEq in 100 mL IVPB (0 mEq Intravenous Stopped 10/30/16 1513)  magnesium sulfate IVPB 2 g 50 mL (0 g Intravenous Stopped 10/30/16 1401)     Initial Impression / Assessment and Plan / ED Course  I have reviewed  the triage vital signs and the nursing notes.  Pertinent labs & imaging results that were available during my care of the patient were reviewed by me and considered in my medical decision making (see chart for details).    61 yo M presents with nausea/vomiting/diarrhea x1 day. Seen by PCP this morning and sent to do ED because of hypokalemia and hypomagnesia. Benign abdominal exam. No indications for CT abd/pelvis at this time given history and benign abdominal exam.  Consider infectious etiology vs electrolyte imbalance. Will give IVF for fluid resuscitation and PO potassium.  Plan to check CMP, CBC, UA, EKG, Trop, Lipase. Will repeat CXR to eval for pneumonia.   Labs and imaging reviewed. Potassium noted to be at 1.8. Additional supplemental potassium ordered. Given critically low potassium level, patient will be admitted.   Dr. Darl Householder consulted with hospitalist and will plan for transfer and admission to [facility] for further evaluation. Discussed plan with patient and he is agreeable.   Final Clinical Impressions(s) / ED Diagnoses   Final diagnoses:  Hypokalemia    New Prescriptions New Prescriptions   No medications on file     Volanda Napoleon, PA-C 10/30/16 [place] Yao, MD 11/01/16 [phone]

## 2016-10-30 NOTE — Progress Notes (Signed)
    Patient coming from [facility] due to 2/4 hrs of intractable N/V resulting in HypoK 1.8, HypoMag 0.5. EKG reassuring. N/V likely from viral gastro. No acute abdomen.  Recently treated for CAP w/ Doxy. Doubt Cdiff. IV and PO repletion started at  [facility]. Accepted to Tele bed at [facility] under observation status.   Linna Darner, MD [facility] 10/30/2016, 2:38 PM

## 2016-10-30 NOTE — Telephone Encounter (Signed)
Patient is currently admitted. [place] notified.

## 2016-10-30 NOTE — Telephone Encounter (Signed)
Does he have a driver to take him from the appointment

## 2016-10-30 NOTE — Progress Notes (Signed)
Subjective:    Patient ID: Sean Rose, male    DOB: 01-21-1956, 61 y.o.   MRN: [id number]  HPI  61 year old male who  has a past medical history of Allergy; CAD (coronary artery disease); GERD (gastroesophageal reflux disease); Hemorrhoids; History of anal fissures; Hyperlipidemia; Hypertension; Peripheral arterial disease ([place]); and Tobacco abuse. He presents to the office today for the acute complaint of nausea and vomiting over the last 24 hours. He reports that he started feeling " bad" at work yesterday and on the way home he felt as though he was going to pass out, he pulled over to the side of the road and vomited 5 times. He started driving again and had to pull over another time to vomit.   When he got home yesterday he continued vomiting throughout the day and then started to have diarrhea. In total he reports about 6-8 episodes of diarrhea throughout the night and into this morning   He denies any fevers but reports chills and sweating.   He has been able to keep Gatorade, Pedialyte and chicken broth down.   He continues to feel nauseated   He reports that he has had problems in the past with his potassium and magnesium levels dropping rapidly when he becomes sick   Review of Systems See HPI   Past Medical History:  Diagnosis Date  . Allergy    seasonal allergies  . CAD (coronary artery disease)   . GERD (gastroesophageal reflux disease)   . Hemorrhoids   . History of anal fissures   . Hyperlipidemia   . Hypertension   . Peripheral arterial disease ([place])    status post right common iliac artery stenting back in 2007  . Tobacco abuse     Social History   Social History  . Marital status: Married    Spouse name: N/A  . Number of children: N/A  . Years of education: N/A   Occupational History  . Not on file.   Social History Main Topics  . Smoking status: Current Every Day Smoker    Packs/day: 1.00    Types: Cigarettes  . Smokeless tobacco: Never Used       Comment: smoked age 20-present , up to 2 ppd. 12/24/12 < 1 ppd  . Alcohol use 21.0 oz/week    35 Cans of beer per week     Comment:  14 drinks/ week  . Drug use: No  . Sexual activity: Not on file   Other Topics Concern  . Not on file   Social History Narrative  . No narrative on file    Past Surgical History:  Procedure Laterality Date  . CARDIAC CATHETERIZATION  09/23/2008   Continued medical therapy - may need GI evaluation in addition.  Marland Kitchen CARDIAC CATHETERIZATION  10/28/2007   Medical therapy recommended.  Marland Kitchen CARDIAC CATHETERIZATION  11/18/2006   In-stent restenosis RCA  (50% distal edge, 80% segmental mid, and 50-60% segmental proximal). Successful cutting balloon atherectomy using a 325X15 cutting balloon. 3 inflations with atherectomy performed on mid and proximal portions resulting in reduction of 80% mid in-stent restenosis to less than 20% residual and 50-60% segmental proximal to less than 20% residual without dissection.  Marland Kitchen CARDIAC CATHETERIZATION  02/26/2006   Severe stenosis in RCA. Stenting performed using IVUS. 3.5x20 Maverick balloon deployed at [facility]. Distal stent-a 4x28 Liberte stent-deployed 12atm 48sec, 12atm 31sec, 4atm 19sec. Mid stent-a 4x28 Liberte stent-deployed 14atm 45sec, 14atm 60sec, 14atm 44sec. Proximal stent-4x8  Liberte- 14atm 45sec,14atm 47sec, 16atm 43sec. Severely diseased segment then appeared TIMI-3 flow.  Marland Kitchen CARDIOVASCULAR STRESS TEST  11/17/2012   No significant ECG changes. Septal perfusion defect is new when complared to study from 2010. Abnormal myocardial perfusion imaging with a basal to mid perfusion suggestive of previous MI.  Marland Kitchen CAROTID DOPPLER  08/09/2011   Bilateral Bulb/Proximal ICA - demonstrated a mild amount of fibrous plaque without evidence of significant diameter reduction reduction or other vascular abnormality.  . COLONOSCOPY     2003, 2014  . CORONARY STENT PLACEMENT  2007 & 2009  . FEMORAL ARTERY STENT    . LOWER EXTREMITY  ARTERIAL DOPPLER  01/31/2011   Bilateral ABIs-normal values with no suggestion of arterial insuff to the lower extremities at rest. Right CIA stent-mild amount of nonhemodynamically significant plaque is noted throughout  . Ortho surgeries     8 different surgeries  . PERCUTANEOUS STENT INTERVENTION  04/04/2006 & 04/13/2015   a. Right common iliac artery with an 8.0x18 mm Herculink stent deployed at 12 atm. Stenosis was reduced from 80% to 0% with brisk flow. b. I-cast stenting to left common iliac artery  . PERIPHERAL VASCULAR CATHETERIZATION N/A 04/13/2015   Procedure: Lower Extremity Angiography;  Surgeon: Lorretta Harp, MD; L-oCIA 75%, 40-50% L-EIA, R-CIA stent patent, s/p 8 mm x 38 mm ICast covered stent>>0% stenosis in L-oCIA     . TRANSTHORACIC ECHOCARDIOGRAM  11/26/2012   EF not noted. Aortic valve-sclerosis without stenosis, no regurgiation.   Marland Kitchen UPPER GASTROINTESTINAL ENDOSCOPY    . US CAROTID DOPPLER BILATERAL ([place] HX)  08/09/2011   Bilateral Bulb/Proximal ICAa demonstrated a mild amount of fibrous plaque without evidence of significant diameter reduction or any other vascular abnormality.    Family History  Problem Relation Age of Onset  . Colon cancer Mother   . Heart disease Father   . Heart disease Paternal Grandfather     Allergies  Allergen Reactions  . Tylox [Oxycodone-Acetaminophen] Nausea Only    Current Outpatient Prescriptions on File Prior to Visit  Medication Sig Dispense Refill  . amLODipine (NORVASC) 5 MG tablet Take 1 tablet (5 mg total) by mouth daily. 30 tablet 11  . aspirin EC 81 MG tablet Take 81 mg by mouth daily.    . clopidogrel (PLAVIX) 75 MG tablet Take 1 tablet (75 mg total) by mouth daily. 30 tablet 11  . esomeprazole (NEXIUM) 40 MG capsule Take 40 mg by mouth daily.     . fexofenadine (ALLEGRA) 180 MG tablet Take 180 mg by mouth daily.    . meloxicam (MOBIC) 7.5 MG tablet Take 1 tablet (7.5 mg total) by mouth daily. 30 tablet 0  . metoprolol  succinate (TOPROL-XL) 100 MG 24 hr tablet Take 1 tablet (100 mg total) by mouth daily. 30 tablet 11  . Multiple Vitamin (MULTIVITAMIN WITH MINERALS) TABS tablet Take 1 tablet by mouth daily.    . nitroGLYCERIN (NITROSTAT) 0.4 MG SL tablet Place 1 tablet (0.4 mg total) under the tongue every 5 (five) minutes as needed for chest pain. 25 tablet 3  . valsartan (DIOVAN) 160 MG tablet Take 1 tablet (160 mg total) by mouth daily. PLEASE SCHEDULE APPOINTMENT 30 tablet 0   No current facility-administered medications on file prior to visit.     BP 112/64 (BP Location: Left Arm, Patient Position: Sitting, Cuff Size: Normal)   Temp 98.3 F (36.8 C) (Oral)   Ht '5\' 9"'  (1.753 m)   Wt 128 lb (58.1 kg)  BMI 18.90 kg/m       Objective:   Physical Exam  Constitutional: He is oriented to person, place, and time. He appears well-developed and well-nourished. No distress.  HENT:  Mouth/Throat: Oropharynx is clear and moist.  Cardiovascular: Normal rate, regular rhythm, normal heart sounds and intact distal pulses.  Exam reveals no gallop.   No murmur heard. Pulmonary/Chest: Effort normal and breath sounds normal. No respiratory distress. He has no wheezes. He has no rales. He exhibits no tenderness.  Abdominal: Soft. He exhibits no distension and no mass. Bowel sounds are increased. There is no tenderness. There is no rebound and no guarding.  Lymphadenopathy:    He has no cervical adenopathy.  Neurological: He is alert and oriented to person, place, and time.  Skin: Skin is warm and dry. No rash noted. He is not diaphoretic. No erythema. No pallor.  Psychiatric: He has a normal mood and affect. His behavior is normal. Judgment and thought content normal.  Nursing note and vitals reviewed.     Assessment & Plan:  1. Gastroenteritis - BRAT diet  - Magnesium - Basic metabolic panel - ondansetron (ZOFRAN) 4 MG tablet; Take 1 tablet (4 mg total) by mouth every 8 (eight) hours as needed for nausea or  vomiting.  Dispense: 20 tablet; Refill: 0 - ondansetron (ZOFRAN) injection 4 mg; Inject 2 mLs (4 mg total) into the muscle once. - Follow up if no improvement or go to the ER with worsening symptoms   [name], NP

## 2016-10-30 NOTE — Progress Notes (Signed)
Sean Rose is a 61 y.o. male patient admitted from ED awake, alert - oriented  X 4 - no acute distress noted.  VSS - Blood pressure 112/62, pulse 62, temperature 98.1 F (36.7 C), temperature source Oral, resp. rate 18, height '5\' 9"'$  (1.753 m), weight 58.1 kg (128 lb), SpO2 95 %.    IV in place, occlusive dsg intact without redness.  Orientation to room, and floor completed with information packet given to patient/family.  Patient declined safety video at this time.  Admission INP armband ID verified with patient/family, and in place.   SR up x 2, fall assessment complete, with patient and family able to verbalize understanding of risk associated with falls, and verbalized understanding to call nsg before up out of bed.  Call light within reach, patient able to voice, and demonstrate understanding.  Skin, clean-dry- intact without evidence of bruising, or skin tears.   No evidence of skin break down noted on exam.     Will cont to eval and treat per MD orders.  Luci Bank, RN 10/30/2016 6:03 PM

## 2016-10-30 NOTE — Telephone Encounter (Signed)
Is there anything you can advise?

## 2016-10-30 NOTE — H&P (Signed)
History and Physical    Sean Rose BRA:[id number] DOB: 11-04-1955 DOA: 10/30/2016  PCP: [name], NP   Patient coming from: Home, by way of [facility]   Chief Complaint: Nausea, vomiting, diarrhea, chills, sweats  HPI: Sean Rose is a 61 y.o. male with medical history significant for coronary artery disease with stents, peripheral arterial disease with stents, mild COPD, and GERD who presents to the [facility] emergency department for evaluation of nausea with vomiting, diarrhea, and electrolyte abnormalities detected by the patient's PCP. Patient reports that he had been in his usual state of health until late yesterday morning when he developed acute nausea and malaise. He cited to leave work just before noon, and while driving, had to pull over in order to vomit. He reports vomiting clear liquid multiple times at the roadside and reports that these symptoms continued with vomiting throughout the night. Patient also developed diarrhea overnight, described as watery, and that also persisted throughout the night and morning. Patient reports chills and sweats associated with this, but no fevers per se. There's been no recent long distance travel and no sick contacts. The symptoms are unusual for him. Patient reports drinking copious amounts of water in attempts to maintain hydration. He saw his primary care physician who advised sports drinks instead and he has since been drinking those. He was sent for blood work and found to have potassium less than 2 and magnesium less than 1.   [facility] ED Course: Upon arrival to the [facility] ED, patient is found to be afebrile, saturating well on room air, and with vital signs stable. EKG features a sinus rhythm and chest x-ray is notable for mild hyperinflation consistent with known COPD, but no pneumonia or CHF findings. Chemistry panel reveals a sodium of 128, potassium of 1.8, magnesium 0.5, calcium 6.8, and albumin 3.3. CBC is notable for a mild  normocytic anemia with hemoglobin 12.2. Patient was treated with 1 L of normal saline, 4 mg IV Zofran, 2 g IV magnesium, 30 mEq IV potassium, and 40 mEq oral potassium. Observation on the telemetry unit at [facility] was arranged and the patient will be observed for ongoing evaluation and management of nausea, vomiting, and diarrhea suspected secondary to acute viral gastroenteritis, resulting in critical electrolyte abnormalities.  Review of Systems:  All other systems reviewed and apart from HPI, are negative.  Past Medical History:  Diagnosis Date  . Anginal pain ([place])   . CAD (coronary artery disease)   . CAP (community acquired pneumonia) 09/2016  . COPD (chronic obstructive pulmonary disease) ([place])   . GERD (gastroesophageal reflux disease)   . Heart murmur    "I was told I had one when I was a kid"  . Hemorrhoids   . History of anal fissures    "no surgeries" (10/30/2016)  . Hyperlipidemia   . Hypertension   . Peripheral arterial disease ([place])    status post right common iliac artery stenting back in 2007  . Seasonal allergies   . Tobacco abuse     Past Surgical History:  Procedure Laterality Date  . ANKLE SURGERY Left    "rebuilt it"  . ANTERIOR CRUCIATE LIGAMENT REPAIR Right   . CARDIAC CATHETERIZATION  09/23/2008   Continued medical therapy - may need GI evaluation in addition.  Marland Kitchen CARDIAC CATHETERIZATION  10/28/2007   Medical therapy recommended.  Marland Kitchen CARDIAC CATHETERIZATION  11/18/2006   In-stent restenosis RCA  (50% distal edge, 80% segmental mid, and 50-60% segmental  proximal). Successful cutting balloon atherectomy using a 325X15 cutting balloon. 3 inflations with atherectomy performed on mid and proximal portions resulting in reduction of 80% mid in-stent restenosis to less than 20% residual and 50-60% segmental proximal to less than 20% residual without dissection.  Marland Kitchen CARDIAC CATHETERIZATION  02/26/2006   Severe stenosis in RCA. Stenting performed using IVUS. 3.5x20  Maverick balloon deployed at [facility]. Distal stent-a 4x28 Liberte stent-deployed 12atm 48sec, 12atm 31sec, 4atm 19sec. Mid stent-a 4x28 Liberte stent-deployed 14atm 45sec, 14atm 60sec, 14atm 44sec. Proximal stent-4x8 Liberte- 14atm 45sec,14atm 47sec, 16atm 43sec. Severely diseased segment then appeared TIMI-3 flow.  Marland Kitchen CARDIOVASCULAR STRESS TEST  11/17/2012   No significant ECG changes. Septal perfusion defect is new when complared to study from 2010. Abnormal myocardial perfusion imaging with a basal to mid perfusion suggestive of previous MI.  Marland Kitchen CAROTID DOPPLER  08/09/2011   Bilateral Bulb/Proximal ICA - demonstrated a mild amount of fibrous plaque without evidence of significant diameter reduction reduction or other vascular abnormality.  . COLONOSCOPY     2003, 2014  . CORONARY ANGIOPLASTY WITH STENT PLACEMENT    . FEMORAL ARTERY STENT    . INGUINAL HERNIA REPAIR Right   . KNEE ARTHROSCOPY Right "multiple"  . LOWER EXTREMITY ARTERIAL DOPPLER  01/31/2011   Bilateral ABIs-normal values with no suggestion of arterial insuff to the lower extremities at rest. Right CIA stent-mild amount of nonhemodynamically significant plaque is noted throughout  . 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 PERCUTANEOUS STENT INTERVENTION  04/04/2006 & 04/13/2015   a. Right common iliac artery with an 8.0x18 mm Herculink stent deployed at 12 atm. Stenosis was reduced from 80% to 0% with brisk flow. b. I-cast stenting to left common iliac artery  . PERIPHERAL VASCULAR CATHETERIZATION N/A 04/13/2015   Procedure: Lower Extremity Angiography;  Surgeon: Lorretta Harp, MD; L-oCIA 75%, 40-50% L-EIA, R-CIA stent patent, s/p 8 mm x 38 mm ICast covered stent>>0% stenosis in L-oCIA     . SHOULDER ARTHROSCOPY WITH ROTATOR CUFF REPAIR Right   . TRANSTHORACIC ECHOCARDIOGRAM  11/26/2012   EF not noted. Aortic valve-sclerosis without stenosis, no regurgiation.   Marland Kitchen UPPER GASTROINTESTINAL ENDOSCOPY    . US CAROTID DOPPLER  BILATERAL ([place] HX)  08/09/2011   Bilateral Bulb/Proximal ICAa demonstrated a mild amount of fibrous plaque without evidence of significant diameter reduction or any other vascular abnormality.     reports that he has been smoking Cigarettes.  He has a 57.00 pack-year smoking history. He has never used smokeless tobacco. He reports that he drinks about 21.6 oz of alcohol per week . He reports that he does not use drugs.  Allergies  Allergen Reactions  . Tylox [Oxycodone-Acetaminophen] Nausea Only    Family History  Problem Relation Age of Onset  . Colon cancer Mother   . Heart disease Father   . Heart disease Paternal Grandfather      Prior to Admission medications   Medication Sig Start Date End Date Taking? Authorizing Provider  amLODipine (NORVASC) 5 MG tablet Take 1 tablet (5 mg total) by mouth daily. 07/19/16   Lorretta Harp, MD  aspirin EC 81 MG tablet Take 81 mg by mouth daily.    Historical Provider, MD  clopidogrel (PLAVIX) 75 MG tablet Take 1 tablet (75 mg total) by mouth daily. 07/19/16   Lorretta Harp, MD  esomeprazole (NEXIUM) 40 MG capsule Take 40 mg by mouth daily.     Historical Provider, MD  fexofenadine (  ALLEGRA) 180 MG tablet Take 180 mg by mouth daily.    Historical Provider, MD  meloxicam (MOBIC) 7.5 MG tablet Take 1 tablet (7.5 mg total) by mouth daily. 10/09/16   [name], NP  metoprolol succinate (TOPROL-XL) 100 MG 24 hr tablet Take 1 tablet (100 mg total) by mouth daily. 07/19/16   Lorretta Harp, MD  Multiple Vitamin (MULTIVITAMIN WITH MINERALS) TABS tablet Take 1 tablet by mouth daily.    Historical Provider, MD  nitroGLYCERIN (NITROSTAT) 0.4 MG SL tablet Place 1 tablet (0.4 mg total) under the tongue every 5 (five) minutes as needed for chest pain. 09/27/15   Lorretta Harp, MD  ondansetron (ZOFRAN) 4 MG tablet Take 1 tablet (4 mg total) by mouth every 8 (eight) hours as needed for nausea or vomiting. 10/30/16   [name], NP  valsartan (DIOVAN)  160 MG tablet Take 1 tablet (160 mg total) by mouth daily. PLEASE SCHEDULE APPOINTMENT 05/21/16   Lorretta Harp, MD    Physical Exam: Vitals:   10/30/16 1400 10/30/16 1430 10/30/16 1530 10/30/16 1801  BP: 101/71 107/72 121/79 112/62  Pulse: (!) 57 62 64 62  Resp: '16 19 13 18  ' Temp:      TempSrc:      SpO2: 98% 100% 100% 95%  Weight:      Height:          Constitutional: NAD, calm. Appears uncomfortable.  Eyes: PERTLA, lids and conjunctivae normal ENMT: Mucous membranes are dry. Posterior pharynx clear of any exudate or lesions.   Neck: normal, supple, no masses, no thyromegaly Respiratory: clear to auscultation bilaterally, no wheezing, no crackles. Normal respiratory effort.   Cardiovascular: S1 & S2 heard, regular rate and rhythm. No extremity edema. No significant JVD. Abdomen: No distension, no tenderness, no masses palpated. Bowel sounds active.  Musculoskeletal: no clubbing / cyanosis. No joint deformity upper and lower extremities.    Skin: no significant rashes, lesions, ulcers. Warm, dry, well-perfused. Poor turgor.  Neurologic: CN 2-12 grossly intact. Sensation intact, DTR normal. Strength 5/5 in all 4 limbs.  Psychiatric: Alert and oriented x 3. Normal mood and affect.     Labs on Admission: I have personally reviewed following labs and imaging studies  CBC:  Recent Labs Lab 10/30/16 1251  WBC 7.4  NEUTROABS 5.8  HGB 12.2*  HCT 33.0*  MCV 99.1  PLT 321   Basic Metabolic Panel:  Recent Labs Lab 10/30/16 0842 10/30/16 1251  NA 131* 128*  K 2.7* 1.8*  CL 94* 94*  CO2 28 25  GLUCOSE 112* 115*  BUN 6 7  CREATININE 0.76 0.91  CALCIUM 7.6* 6.8*  MG 0.5* 0.5*   GFR: Estimated Creatinine Clearance: 70.9 mL/min (by C-G formula based on SCr of 0.91 mg/dL). Liver Function Tests:  Recent Labs Lab 10/30/16 1251  AST 34  ALT 19  ALKPHOS 48  BILITOT 0.5  PROT 6.0*  ALBUMIN 3.3*    Recent Labs Lab 10/30/16 1251  LIPASE 25   No results for  input(s): AMMONIA in the last 168 hours. Coagulation Profile: No results for input(s): INR, PROTIME in the last 168 hours. Cardiac Enzymes:  Recent Labs Lab 10/30/16 1251  TROPONINI <0.03   BNP (last 3 results) No results for input(s): PROBNP in the last 8760 hours. HbA1C: No results for input(s): HGBA1C in the last 72 hours. CBG: No results for input(s): GLUCAP in the last 168 hours. Lipid Profile: No results for input(s): CHOL, HDL, LDLCALC, TRIG, CHOLHDL,  LDLDIRECT in the last 72 hours. Thyroid Function Tests: No results for input(s): TSH, T4TOTAL, FREET4, T3FREE, THYROIDAB in the last 72 hours. Anemia Panel: No results for input(s): VITAMINB12, FOLATE, FERRITIN, TIBC, IRON, RETICCTPCT in the last 72 hours. Urine analysis:    Component Value Date/Time   COLORURINE AMBER (A) 10/30/2016 1251   APPEARANCEUR CLOUDY (A) 10/30/2016 1251   LABSPEC 1.020 10/30/2016 1251   PHURINE 6.0 10/30/2016 1251   GLUCOSEU 100 (A) 10/30/2016 1251   HGBUR NEGATIVE 10/30/2016 1251   BILIRUBINUR SMALL (A) 10/30/2016 1251   KETONESUR 15 (A) 10/30/2016 1251   PROTEINUR 100 (A) 10/30/2016 1251   NITRITE NEGATIVE 10/30/2016 1251   LEUKOCYTESUR TRACE (A) 10/30/2016 1251   Sepsis Labs: '@LABRCNTIP' (procalcitonin:4,lacticidven:4) )No results found for this or any previous visit (from the past 240 hour(s)).   Radiological Exams on Admission: Dg Chest 2 View  Result Date: 10/30/2016 CLINICAL DATA:  Vomiting, diarrhea, dehydration, cough, and fever since yesterday. History of COPD, current smoker, coronary artery disease with stent placement. EXAM: CHEST  2 VIEW COMPARISON:  PA and lateral chest x-ray of October 15, 2016 FINDINGS: The lungs are mildly hyperinflated. There is no focal infiltrate. There is no pleural effusion. The heart and pulmonary vascularity are normal. There is calcification in the wall of the aortic arch and descending thoracic aorta. The bony thorax is unremarkable. IMPRESSION: Mild  hyperinflation consistent with known COPD. No pneumonia nor CHF. Thoracic aortic atherosclerosis. Electronically Signed   By: David  Martinique M.D.   On: 10/30/2016 13:28    EKG: Independently reviewed. Normal sinus rhythm.  Assessment/Plan  1. Critical hypokalemia, hypomagnesemia  - Presents with serum potassium 1.8, magnesium 0.5  - Likely secondary to GI-losses; addressing this as below  - No significant EKG changes noted  - He was treated with 2 g IV mag, 30 mEq IV potassium, and 40 mEq oral potassium at [facility]  - Repeat chemistries with mag level now and replete further  - Continue cardiac monitoring   2. Nausea, vomiting, diarrhea  - No fever or leukocytosis; no travel or sick contacts  - No significant abdominal pain and exam is benign  - Suspect acute viral GE, but with recent abx use, C diff is considered  - Send stool for GI panel and C diff testing; maintain enteric precautions  - Treat with prn Zofran, prn phenergan  - Hydrate and monitor lytes   3. Hyponatremia  - Serum sodium is 128 on admission  - Pt reports drinking a lot of water the day prior to admission, trying to maintain hydration  - He is also hypovolemic on admission and his free-water intake likely contributing  - Given 1 liter NS at [facility], continued on NS infusion  - Serial chem panels    4. CAD, PAD - No anginal complaints - Is s/p stents to coronaries and LE's  - Continue ASA 81, Plavix, and beta-blocker as tolerated  5. Hypertension  - BP at goal, a little soft at times   - Norvasc and valsartan held on admission given vomiting, diarrhea, soft BP - Continue Toprol in the am with holding parameters   DVT prophylaxis: sq Lovenox Code Status: Full  Family Communication: Wife updated at bedside Disposition Plan: Observe on telemetry Consults called: None Admission status: Observation    Vianne Bulls, MD [facility] Pager [phone]  If 7PM-7AM, please contact  night-coverage [email or web address] Password [email or web address]  10/30/2016, 7:01 PM

## 2016-10-30 NOTE — Telephone Encounter (Signed)
° ° ° °  Pt is having a MRI on 10/31/16 and call to say that he will need something to settle him down .Marland Kitchen  Would like a call back    [place]

## 2016-10-30 NOTE — Progress Notes (Signed)
CRITICAL VALUE ALERT  Critical value received:  Potassium 2.4, calcium 6.0, magnesium 1.1  Date of notification:  10/30/2016  Time of notification:  2005  Critical value read back:Yes.    Nurse who received alert:  Harlow Asa, BSN, RN  MD notified (1st page):  Dr. Tylene Fantasia  Time of first page:  2022  MD notified (2nd page):  Time of second page:  Responding MD:  Dr. Tylene Fantasia  Time MD responded:  2030

## 2016-10-30 NOTE — ED Triage Notes (Signed)
Pt c/o n/v/d started yesterday-was sent from PCP-states was treated for PNE last week-NAD-steady gait

## 2016-10-30 NOTE — ED Notes (Signed)
Patient transported to X-ray 

## 2016-10-31 ENCOUNTER — Inpatient Hospital Stay: Admission: RE | Admit: 2016-10-31 | Payer: BLUE CROSS/BLUE SHIELD | Source: Ambulatory Visit

## 2016-10-31 DIAGNOSIS — R197 Diarrhea, unspecified: Secondary | ICD-10-CM | POA: Diagnosis not present

## 2016-10-31 DIAGNOSIS — I2510 Atherosclerotic heart disease of native coronary artery without angina pectoris: Secondary | ICD-10-CM | POA: Diagnosis not present

## 2016-10-31 DIAGNOSIS — I10 Essential (primary) hypertension: Secondary | ICD-10-CM | POA: Diagnosis not present

## 2016-10-31 DIAGNOSIS — E876 Hypokalemia: Secondary | ICD-10-CM | POA: Diagnosis not present

## 2016-10-31 LAB — GASTROINTESTINAL PANEL BY PCR, STOOL (REPLACES STOOL CULTURE)
ADENOVIRUS F40/41: NOT DETECTED
Astrovirus: NOT DETECTED
CAMPYLOBACTER SPECIES: NOT DETECTED
CRYPTOSPORIDIUM: NOT DETECTED
Cyclospora cayetanensis: NOT DETECTED
ENTEROAGGREGATIVE E COLI (EAEC): DETECTED — AB
ENTEROPATHOGENIC E COLI (EPEC): NOT DETECTED
Entamoeba histolytica: NOT DETECTED
Enterotoxigenic E coli (ETEC): NOT DETECTED
Giardia lamblia: NOT DETECTED
NOROVIRUS GI/GII: DETECTED — AB
PLESIMONAS SHIGELLOIDES: NOT DETECTED
ROTAVIRUS A: NOT DETECTED
SHIGELLA/ENTEROINVASIVE E COLI (EIEC): NOT DETECTED
Salmonella species: NOT DETECTED
Sapovirus (I, II, IV, and V): NOT DETECTED
Shiga like toxin producing E coli (STEC): NOT DETECTED
Vibrio cholerae: NOT DETECTED
Vibrio species: NOT DETECTED
YERSINIA ENTEROCOLITICA: NOT DETECTED

## 2016-10-31 LAB — COMPREHENSIVE METABOLIC PANEL
ALBUMIN: 2.4 g/dL — AB (ref 3.5–5.0)
ALT: 16 U/L — AB (ref 17–63)
AST: 29 U/L (ref 15–41)
Alkaline Phosphatase: 42 U/L (ref 38–126)
Anion gap: 8 (ref 5–15)
BILIRUBIN TOTAL: 0.3 mg/dL (ref 0.3–1.2)
CO2: 20 mmol/L — ABNORMAL LOW (ref 22–32)
CREATININE: 0.56 mg/dL — AB (ref 0.61–1.24)
Calcium: 6.3 mg/dL — CL (ref 8.9–10.3)
Chloride: 104 mmol/L (ref 101–111)
GFR calc Af Amer: >60 mL/min (ref >60)
GLUCOSE: 96 mg/dL (ref 65–99)
POTASSIUM: 3.2 mmol/L — AB (ref 3.5–5.1)
Sodium: 132 mmol/L — ABNORMAL LOW (ref 135–145)
TOTAL PROTEIN: 4.6 g/dL — AB (ref 6.5–8.1)

## 2016-10-31 LAB — HIV ANTIBODY (ROUTINE TESTING W REFLEX): HIV SCREEN 4TH GENERATION: NONREACTIVE

## 2016-10-31 LAB — MAGNESIUM: Magnesium: 1.8 mg/dL (ref 1.7–2.4)

## 2016-10-31 MED ORDER — CALCIUM CARBONATE ANTACID 500 MG PO CHEW: 1.0000 | CHEWABLE_TABLET | Freq: Two times a day (BID) | ORAL | 0 refills | Status: DC

## 2016-10-31 MED ORDER — CALCIUM CARBONATE ANTACID 500 MG PO CHEW: 1.0000 | CHEWABLE_TABLET | Freq: Three times a day (TID) | ORAL | Status: DC

## 2016-10-31 MED ORDER — POTASSIUM CHLORIDE CRYS ER 20 MEQ PO TBCR
40.0000 meq | EXTENDED_RELEASE_TABLET | ORAL | Status: AC
  Administered 2016-10-31 (×2): 40 meq via ORAL
  Filled 2016-10-31 (×2): qty 2

## 2016-10-31 MED ORDER — POTASSIUM CHLORIDE CRYS ER 20 MEQ PO TBCR: 20.0000 meq | EXTENDED_RELEASE_TABLET | Freq: Once | ORAL | Status: DC

## 2016-10-31 NOTE — Progress Notes (Signed)
Pt stating he is ready to leave. Says Md said he would be DC before 11 am today. No orders as of yet. Md notified that Pt says he will leave in 30 min with or without a DC order. Pt currently states no medical treatment is being given to him. Pt was given K+ around 10 am today and refused all other meds. Has taken tele off and dressed himself. Will continue to monitor and await orders.

## 2016-10-31 NOTE — Discharge Instructions (Addendum)
Sean Rose,  You were in the hospital because of your electrolytes. These have been replaced. Please continue to take your medication as prescribed and follow-up with your primary care physician for follow-up lab work. Your valsartan and amlodipine have been recommended to be held because your blood pressure is on the lower side.

## 2016-10-31 NOTE — Progress Notes (Signed)
CRITICAL VALUE ALERT  Critical value received:  *Calcium 6.3, potassium 3.2  Date of notification:  10/31/2016  Time of notification:  0648  Critical value read back:Yes.    Nurse who received alert:  Harlow Asa, BSN, RN  MD notified (1st page):  Dr. Tylene Fantasia  Time of first page:  [phone]  MD notified (2nd page):  Time of second page:  Responding MD:  Dr. Tylene Fantasia  Time MD responded:  [phone]

## 2016-10-31 NOTE — Progress Notes (Signed)
Pt given discharge instructions, prescriptions, and care notes. Pt verbalized understanding AEB no further questions or concerns at this time. IV was discontinued, no redness, pain, or swelling noted at this time. Telemetry discontinued and Centralized Telemetry was notified. Pt left the floor in stable condition.

## 2016-10-31 NOTE — Discharge Summary (Signed)
Physician Discharge Summary  Sean Rose OIN:[id number] DOB: Jul 18, 1956 DOA: 10/30/2016  PCP: [name], NP  Admit date: 10/30/2016 Discharge date: 10/31/2016  Admitted From: Home Disposition: Home  Recommendations for Outpatient Follow-up:  1. Follow up with PCP in 1 week 2. Please obtain BMP/magnesium in one day to recheck potassium, calcium, magnesium 3. Restart blood pressure medications as needed 4. Please follow up on the following pending results: GI pathogen panel   Discharge Condition: Stable CODE STATUS: Full code Diet recommendation: Regular diet   Brief/Interim Summary:  Admission HPI written by Vianne Bulls, MD   Chief Complaint: Nausea, vomiting, diarrhea, chills, sweats  HPI: Sean Rose is a 61 y.o. male with medical history significant for coronary artery disease with stents, peripheral arterial disease with stents, mild COPD, and GERD who presents to the [facility] emergency department for evaluation of nausea with vomiting, diarrhea, and electrolyte abnormalities detected by the patient's PCP. Patient reports that he had been in his usual state of health until late yesterday morning when he developed acute nausea and malaise. He cited to leave work just before noon, and while driving, had to pull over in order to vomit. He reports vomiting clear liquid multiple times at the roadside and reports that these symptoms continued with vomiting throughout the night. Patient also developed diarrhea overnight, described as watery, and that also persisted throughout the night and morning. Patient reports chills and sweats associated with this, but no fevers per se. There's been no recent long distance travel and no sick contacts. The symptoms are unusual for him. Patient reports drinking copious amounts of water in attempts to maintain hydration. He saw his primary care physician who advised sports drinks instead and he has since been drinking those. He was sent for blood work  and found to have potassium less than 2 and magnesium less than 1.   [facility] ED Course: Upon arrival to the [facility] ED, patient is found to be afebrile, saturating well on room air, and with vital signs stable. EKG features a sinus rhythm and chest x-ray is notable for mild hyperinflation consistent with known COPD, but no pneumonia or CHF findings. Chemistry panel reveals a sodium of 128, potassium of 1.8, magnesium 0.5, calcium 6.8, and albumin 3.3. CBC is notable for a mild normocytic anemia with hemoglobin 12.2. Patient was treated with 1 L of normal saline, 4 mg IV Zofran, 2 g IV magnesium, 30 mEq IV potassium, and 40 mEq oral potassium. Observation on the telemetry unit at [facility] was arranged and the patient will be observed for ongoing evaluation and management of nausea, vomiting, and diarrhea suspected secondary to acute viral gastroenteritis, resulting in critical electrolyte abnormalities.   Hospital course:  Hypokalemia Hypomagnesemia Hypocalcemia Potassium and magnesium repleted. Calcium repletion with calcium carbonate. Continue at discharge. Secondary to GI losses.  Nausea and vomiting Diarrhea Likely viral GI pathogen infection. Supportive care. Symptoms resolved.  Hyponatremia Secondary to dehydration. Resolved with IV fluids.  CAD PAD No chest pain. Continued aspirin, Plavix and metoprolol  Hypertension Continued metoprolol. Valsartan and Amlodipine held on discharge secondary to softer blood pressure.  Discharge Diagnoses:  Principal Problem:   Hypokalemia Active Problems:   Coronary atherosclerosis of native coronary artery   Essential hypertension   Hypomagnesemia   Nausea vomiting and diarrhea   Hyponatremia   Hypocalcemia    Discharge Instructions  Discharge Instructions    Call MD for:  difficulty breathing, headache or visual disturbances  Complete by:  As directed    Call MD for:  extreme fatigue    Complete by:  As  directed    Call MD for:  persistant dizziness or light-headedness    Complete by:  As directed    Call MD for:  persistant nausea and vomiting    Complete by:  As directed    Diet - low sodium heart healthy    Complete by:  As directed    Increase activity slowly    Complete by:  As directed      Allergies as of 10/31/2016      Reactions   Tylox [oxycodone-acetaminophen] Nausea Only      Medication List    STOP taking these medications   amLODipine 5 MG tablet Commonly known as:  NORVASC   valsartan 160 MG tablet Commonly known as:  DIOVAN     TAKE these medications   aspirin EC 81 MG tablet Take 81 mg by mouth daily.   calcium carbonate 500 MG chewable tablet Commonly known as:  TUMS - dosed in mg elemental calcium Chew 1 tablet (200 mg of elemental calcium total) by mouth 2 (two) times daily.   clopidogrel 75 MG tablet Commonly known as:  PLAVIX Take 1 tablet (75 mg total) by mouth daily.   esomeprazole 40 MG capsule Commonly known as:  NEXIUM Take 40 mg by mouth daily.   fexofenadine 180 MG tablet Commonly known as:  ALLEGRA Take 180 mg by mouth daily.   meloxicam 7.5 MG tablet Commonly known as:  MOBIC Take 1 tablet (7.5 mg total) by mouth daily.   metoprolol succinate 100 MG 24 hr tablet Commonly known as:  TOPROL-XL Take 1 tablet (100 mg total) by mouth daily.   multivitamin with minerals Tabs tablet Take 1 tablet by mouth daily.   nitroGLYCERIN 0.4 MG SL tablet Commonly known as:  NITROSTAT Place 1 tablet (0.4 mg total) under the tongue every 5 (five) minutes as needed for chest pain.   ondansetron 4 MG tablet Commonly known as:  ZOFRAN Take 1 tablet (4 mg total) by mouth every 8 (eight) hours as needed for nausea or vomiting.      Follow-up Information    [name], NP. Schedule an appointment as soon as possible for a visit in 1 week(s).   Specialty:  Family Medicine Why:  Check metabolic panel in 1 day Contact information: [place] [id number] [phone]          Allergies  Allergen Reactions  . Tylox [Oxycodone-Acetaminophen] Nausea Only    Consultations:  None   Procedures/Studies: Dg Chest 2 View  Result Date: 10/30/2016 CLINICAL DATA:  Vomiting, diarrhea, dehydration, cough, and fever since yesterday. History of COPD, current smoker, coronary artery disease with stent placement. EXAM: CHEST  2 VIEW COMPARISON:  PA and lateral chest x-ray of October 15, 2016 FINDINGS: The lungs are mildly hyperinflated. There is no focal infiltrate. There is no pleural effusion. The heart and pulmonary vascularity are normal. There is calcification in the wall of the aortic arch and descending thoracic aorta. The bony thorax is unremarkable. IMPRESSION: Mild hyperinflation consistent with known COPD. No pneumonia nor CHF. Thoracic aortic atherosclerosis. Electronically Signed   By: David  Martinique M.D.   On: 10/30/2016 13:28   Dg Chest 2 View  Result Date: 10/15/2016 CLINICAL DATA:  Cough, congestion, shortness of breath and low-grade fever EXAM: CHEST  2 VIEW COMPARISON:  04/05/2015 FINDINGS: Stable hyperinflation compatible  with COPD/ emphysema. No superimposed pneumonia, collapse or consolidation. Normal heart size and vascularity. Negative for edema, effusion or pneumothorax. Trachea is midline. Aorta is atherosclerotic and mildly tortuous. Diffuse mild thoracic spondylosis. No acute compression fracture. IMPRESSION: Stable hyperinflation without superimposed acute chest process Thoracic aortic atherosclerosis and tortuosity Electronically Signed   By: [name].  Shick M.D.   On: 10/15/2016 17:18      Subjective: No nausea, vomiting or bowel movement overnight per patient. No muscle aches or spasms. No fatigue.  Discharge Exam: Vitals:   10/30/16 2133 10/31/16 0541  BP: (!) 95/56 117/65  Pulse: 64 65  Resp: 18 18  Temp: 98 F (36.7 C) 98 F (36.7 C)   Vitals:   10/30/16 1530 10/30/16 1801  10/30/16 2133 10/31/16 0541  BP: 121/79 112/62 (!) 95/56 117/65  Pulse: 64 62 64 65  Resp: '13 18 18 18  '$ Temp:   98 F (36.7 C) 98 F (36.7 C)  TempSrc:   Oral Oral  SpO2: 100% 95% 99% 98%  Weight:    58.2 kg (128 lb 6.4 oz)  Height:        General: Pt is alert, awake, not in acute distress Cardiovascular: RRR, S1/S2 +, no rubs, no gallops Respiratory: CTA bilaterally, no wheezing, no rhonchi Abdominal: Soft, NT, ND, bowel sounds + Extremities: no edema, no cyanosis    The results of significant diagnostics from this hospitalization (including imaging, microbiology, ancillary and laboratory) are listed below for reference.     Microbiology: Recent Results (from the past 240 hour(s))  C difficile quick screen w PCR reflex     Status: None   Collection Time: 10/30/16  6:32 PM  Result Value Ref Range Status   C Diff antigen NEGATIVE NEGATIVE Final   C Diff toxin NEGATIVE NEGATIVE Final   C Diff interpretation No C. difficile detected.  Final     Labs: BNP (last 3 results) No results for input(s): BNP in the last 8760 hours. Basic Metabolic Panel:  Recent Labs Lab 10/30/16 0842 10/30/16 1251 10/30/16 1902 10/31/16 0508  NA 131* 128* 130* 132*  K 2.7* 1.8* 2.4* 3.2*  CL 94* 94* 101 104  CO2 28 25 21* 20*  GLUCOSE 112* 115* 97 96  BUN 6 7 <5* <5*  CREATININE 0.76 0.91 0.67 0.56*  CALCIUM 7.6* 6.8* 6.0* 6.3*  MG 0.5* 0.5* 1.1* 1.8   Liver Function Tests:  Recent Labs Lab 10/30/16 1251 10/31/16 0508  AST 34 29  ALT 19 16*  ALKPHOS 48 42  BILITOT 0.5 0.3  PROT 6.0* 4.6*  ALBUMIN 3.3* 2.4*    Recent Labs Lab 10/30/16 1251  LIPASE 25   No results for input(s): AMMONIA in the last 168 hours. CBC:  Recent Labs Lab 10/30/16 1251  WBC 7.4  NEUTROABS 5.8  HGB 12.2*  HCT 33.0*  MCV 99.1  PLT 289   Cardiac Enzymes:  Recent Labs Lab 10/30/16 1251  TROPONINI <0.03   BNP: Invalid input(s): POCBNP CBG: No results for input(s): GLUCAP in the  last 168 hours. D-Dimer No results for input(s): DDIMER in the last 72 hours. Hgb A1c No results for input(s): HGBA1C in the last 72 hours. Lipid Profile No results for input(s): CHOL, HDL, LDLCALC, TRIG, CHOLHDL, LDLDIRECT in the last 72 hours. Thyroid function studies No results for input(s): TSH, T4TOTAL, T3FREE, THYROIDAB in the last 72 hours.  Invalid input(s): FREET3 Anemia work up No results for input(s): VITAMINB12, FOLATE, FERRITIN, TIBC, IRON, RETICCTPCT in the  last 72 hours. Urinalysis    Component Value Date/Time   COLORURINE AMBER (A) 10/30/2016 1251   APPEARANCEUR CLOUDY (A) 10/30/2016 1251   LABSPEC 1.020 10/30/2016 1251   PHURINE 6.0 10/30/2016 1251   GLUCOSEU 100 (A) 10/30/2016 1251   HGBUR NEGATIVE 10/30/2016 1251   BILIRUBINUR SMALL (A) 10/30/2016 1251   KETONESUR 15 (A) 10/30/2016 1251   PROTEINUR 100 (A) 10/30/2016 1251   NITRITE NEGATIVE 10/30/2016 1251   LEUKOCYTESUR TRACE (A) 10/30/2016 1251   Sepsis Labs Invalid input(s): PROCALCITONIN,  WBC,  LACTICIDVEN Microbiology Recent Results (from the past 240 hour(s))  C difficile quick screen w PCR reflex     Status: None   Collection Time: 10/30/16  6:32 PM  Result Value Ref Range Status   C Diff antigen NEGATIVE NEGATIVE Final   C Diff toxin NEGATIVE NEGATIVE Final   C Diff interpretation No C. difficile detected.  Final      SIGNED:   Cordelia Poche, MD [facility] 10/31/2016, 12:05 PM Pager [phone]  If 7PM-7AM, please contact night-coverage [email or web address] Password [email or web address]

## 2016-11-01 NOTE — Progress Notes (Signed)
Lab at [facility] called me at 1433 on 10/31/16 to confirm that Pt tested Positive for Enteroaggregative E coli and Norovirus GI in his GI Panel. Pt had already been DC to home at this time.

## 2016-11-05 ENCOUNTER — Ambulatory Visit (HOSPITAL_COMMUNITY)
Admission: RE | Admit: 2016-11-05 | Discharge: 2016-11-05 | Disposition: A | Discharge status: Home / Self Care | Payer: BLUE CROSS/BLUE SHIELD | Source: Ambulatory Visit | Attending: Cardiovascular Disease | Admitting: Cardiovascular Disease

## 2016-11-05 DIAGNOSIS — I6523 Occlusion and stenosis of bilateral carotid arteries: Secondary | ICD-10-CM | POA: Insufficient documentation

## 2016-11-05 DIAGNOSIS — R55 Syncope and collapse: Secondary | ICD-10-CM | POA: Insufficient documentation

## 2017-02-13 ENCOUNTER — Encounter: Payer: Self-pay | Admitting: Adult Health

## 2017-02-13 ENCOUNTER — Ambulatory Visit (INDEPENDENT_AMBULATORY_CARE_PROVIDER_SITE_OTHER): Payer: BLUE CROSS/BLUE SHIELD | Admitting: Adult Health

## 2017-02-13 VITALS — BP 124/80 | HR 57 | Temp 97.6°F | Wt 128.6 lb

## 2017-02-13 DIAGNOSIS — K2900 Acute gastritis without bleeding: Secondary | ICD-10-CM

## 2017-02-13 LAB — CBC WITH DIFFERENTIAL/PLATELET
BASOS PCT: 0.5 % (ref 0.0–3.0)
Basophils Absolute: 0 10*3/uL (ref 0.0–0.1)
EOS ABS: 0.1 10*3/uL (ref 0.0–0.7)
EOS PCT: 1.9 % (ref 0.0–5.0)
HCT: 43.5 % (ref 39.0–52.0)
HEMOGLOBIN: 15.3 g/dL (ref 13.0–17.0)
Lymphocytes Relative: 31.3 % (ref 12.0–46.0)
Lymphs Abs: 2.3 10*3/uL (ref 0.7–4.0)
MCHC: 35.3 g/dL (ref 30.0–36.0)
MCV: 102.5 fl — ABNORMAL HIGH (ref 78.0–100.0)
MONO ABS: 0.8 10*3/uL (ref 0.1–1.0)
Monocytes Relative: 10.6 % (ref 3.0–12.0)
NEUTROS ABS: 4.2 10*3/uL (ref 1.4–7.7)
Neutrophils Relative %: 55.7 % (ref 43.0–77.0)
PLATELETS: 339 10*3/uL (ref 150.0–400.0)
RBC: 4.24 Mil/uL (ref 4.22–5.81)
RDW: 13.6 % (ref 11.5–15.5)
WBC: 7.5 10*3/uL (ref 4.0–10.5)

## 2017-02-13 LAB — COMPREHENSIVE METABOLIC PANEL
ALBUMIN: 4.2 g/dL (ref 3.5–5.2)
ALK PHOS: 56 U/L (ref 39–117)
ALT: 12 U/L (ref 0–53)
AST: 21 U/L (ref 0–37)
BUN: 3 mg/dL — ABNORMAL LOW (ref 6–23)
CO2: 28 mEq/L (ref 19–32)
Calcium: 9.6 mg/dL (ref 8.4–10.5)
Chloride: 92 mEq/L — ABNORMAL LOW (ref 96–112)
Creatinine, Ser: 0.61 mg/dL (ref 0.40–1.50)
GFR: 142.8 mL/min (ref >60.00)
Glucose, Bld: 103 mg/dL — ABNORMAL HIGH (ref 70–99)
POTASSIUM: 3.7 meq/L (ref 3.5–5.1)
SODIUM: 125 meq/L — AB (ref 135–145)
TOTAL PROTEIN: 6.4 g/dL (ref 6.0–8.3)
Total Bilirubin: 0.6 mg/dL (ref 0.2–1.2)

## 2017-02-13 LAB — MAGNESIUM: MAGNESIUM: 1.2 mg/dL — AB (ref 1.5–2.5)

## 2017-02-13 NOTE — Progress Notes (Signed)
Subjective:    Patient ID: Sean Rose, male    DOB: 1955/08/19, 61 y.o.   MRN: [id number]  HPI  61 year old male who  has a past medical history of Anginal pain ([place]); CAD (coronary artery disease); CAP (community acquired pneumonia) (09/2016); COPD (chronic obstructive pulmonary disease) ([place]); GERD (gastroesophageal reflux disease); Heart murmur; Hemorrhoids; History of anal fissures; Hyperlipidemia; Hypertension; Peripheral arterial disease ([place]); Seasonal allergies; and Tobacco abuse. He presents to the office today with vague symptoms x 3 days. His symptoms include that of nausea, intermittent episodes of diarrhea, fatigue, poor sleep, loss of appetite  and " I feel like I have been put through a ringer."  When I last saw him for this issue he was found to have low mag and low potassium, which caused him to be admitted for replenishment.   He denies any vomiting or fevers at this time  Review of Systems See HPI   Past Medical History:  Diagnosis Date  . Anginal pain ([place])   . CAD (coronary artery disease)   . CAP (community acquired pneumonia) 09/2016  . COPD (chronic obstructive pulmonary disease) ([place])   . GERD (gastroesophageal reflux disease)   . Heart murmur    "I was told I had one when I was a kid"  . Hemorrhoids   . History of anal fissures    "no surgeries" (10/30/2016)  . Hyperlipidemia   . Hypertension   . Peripheral arterial disease ([place])    status post right common iliac artery stenting back in 2007  . Seasonal allergies   . Tobacco abuse     Social History   Social History  . Marital status: Married    Spouse name: N/A  . Number of children: N/A  . Years of education: N/A   Occupational History  . Not on file.   Social History Main Topics  . Smoking status: Current Every Day Smoker    Packs/day: 1.50    Years: 38.00    Types: Cigarettes  . Smokeless tobacco: Never Used  . Alcohol use 21.6 oz/week    36 Cans of beer per week     Comment:  10/30/2016 "I usually drink 4-8 beers qd"  . Drug use: No  . Sexual activity: Yes   Other Topics Concern  . Not on file   Social History Narrative  . No narrative on file    Past Surgical History:  Procedure Laterality Date  . ANKLE SURGERY Left    "rebuilt it"  . ANTERIOR CRUCIATE LIGAMENT REPAIR Right   . CARDIAC CATHETERIZATION  09/23/2008   Continued medical therapy - may need GI evaluation in addition.  Marland Kitchen CARDIAC CATHETERIZATION  10/28/2007   Medical therapy recommended.  Marland Kitchen CARDIAC CATHETERIZATION  11/18/2006   In-stent restenosis RCA  (50% distal edge, 80% segmental mid, and 50-60% segmental proximal). Successful cutting balloon atherectomy using a [id number] cutting balloon. 3 inflations with atherectomy performed on mid and proximal portions resulting in reduction of 80% mid in-stent restenosis to less than 20% residual and 50-60% segmental proximal to less than 20% residual without dissection.  Marland Kitchen CARDIAC CATHETERIZATION  02/26/2006   Severe stenosis in RCA. Stenting performed using IVUS. 3.5x20 Maverick balloon deployed at [employer]. Distal stent-a 4x28 Liberte stent-deployed 12atm 48sec, 12atm 31sec, 4atm 19sec. Mid stent-a 4x28 Liberte stent-deployed 14atm 45sec, 14atm 60sec, 14atm 44sec. Proximal stent-4x8 Liberte- 14atm 45sec,14atm 47sec, 16atm 43sec. Severely diseased segment then appeared TIMI-3 flow.  Marland Kitchen CARDIOVASCULAR STRESS TEST  11/17/2012   No significant ECG changes. Septal perfusion defect is new when complared to study from 2010. Abnormal myocardial perfusion imaging with a basal to mid perfusion suggestive of previous MI.  Marland Kitchen CAROTID DOPPLER  08/09/2011   Bilateral Bulb/Proximal ICA - demonstrated a mild amount of fibrous plaque without evidence of significant diameter reduction reduction or other vascular abnormality.  . COLONOSCOPY     2003, 2014  . CORONARY ANGIOPLASTY WITH STENT PLACEMENT    . FEMORAL ARTERY STENT    . INGUINAL HERNIA REPAIR Right   . KNEE ARTHROSCOPY  Right "multiple"  . LOWER EXTREMITY ARTERIAL DOPPLER  01/31/2011   Bilateral ABIs-normal values with no suggestion of arterial insuff to the lower extremities at rest. Right CIA stent-mild amount of nonhemodynamically significant plaque is noted throughout  . 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 PERCUTANEOUS STENT INTERVENTION  04/04/2006 & 04/13/2015   a. Right common iliac artery with an 8.0x18 mm Herculink stent deployed at 12 atm. Stenosis was reduced from 80% to 0% with brisk flow. b. I-cast stenting to left common iliac artery  . PERIPHERAL VASCULAR CATHETERIZATION N/A 04/13/2015   Procedure: Lower Extremity Angiography;  Surgeon: Lorretta Harp, MD; L-oCIA 75%, 40-50% L-EIA, R-CIA stent patent, s/p 8 mm x 38 mm ICast covered stent>>0% stenosis in L-oCIA     . SHOULDER ARTHROSCOPY WITH ROTATOR CUFF REPAIR Right   . TRANSTHORACIC ECHOCARDIOGRAM  11/26/2012   EF not noted. Aortic valve-sclerosis without stenosis, no regurgiation.   Marland Kitchen UPPER GASTROINTESTINAL ENDOSCOPY    . US CAROTID DOPPLER BILATERAL ([facility])  08/09/2011   Bilateral Bulb/Proximal ICAa demonstrated a mild amount of fibrous plaque without evidence of significant diameter reduction or any other vascular abnormality.    Family History  Problem Relation Age of Onset  . Colon cancer Mother   . Heart disease Father   . Heart disease Paternal Grandfather     Allergies  Allergen Reactions  . Tylox [Oxycodone-Acetaminophen] Nausea Only    Current Outpatient Prescriptions on File Prior to Visit  Medication Sig Dispense Refill  . aspirin EC 81 MG tablet Take 81 mg by mouth daily.    . calcium carbonate (TUMS - DOSED IN MG ELEMENTAL CALCIUM) 500 MG chewable tablet Chew 1 tablet (200 mg of elemental calcium total) by mouth 2 (two) times daily. 10 tablet 0  . clopidogrel (PLAVIX) 75 MG tablet Take 1 tablet (75 mg total) by mouth daily. 30 tablet 11  . esomeprazole (NEXIUM) 40 MG capsule Take 40 mg by mouth daily.     .  fexofenadine (ALLEGRA) 180 MG tablet Take 180 mg by mouth daily.    . meloxicam (MOBIC) 7.5 MG tablet Take 1 tablet (7.5 mg total) by mouth daily. 30 tablet 0  . metoprolol succinate (TOPROL-XL) 100 MG 24 hr tablet Take 1 tablet (100 mg total) by mouth daily. 30 tablet 11  . Multiple Vitamin (MULTIVITAMIN WITH MINERALS) TABS tablet Take 1 tablet by mouth daily.    . nitroGLYCERIN (NITROSTAT) 0.4 MG SL tablet Place 1 tablet (0.4 mg total) under the tongue every 5 (five) minutes as needed for chest pain. 25 tablet 3  . ondansetron (ZOFRAN) 4 MG tablet Take 1 tablet (4 mg total) by mouth every 8 (eight) hours as needed for nausea or vomiting. 20 tablet 0   No current facility-administered medications on file prior to visit.     BP 124/80 (BP Location: Right Arm, Patient Position: Sitting, Cuff Size: Normal)  Pulse (!) 57   Temp 97.6 F (36.4 C) (Oral)   Wt 128 lb 9.6 oz (58.3 kg)   SpO2 98%   BMI 18.99 kg/m       Objective:   Physical Exam  Constitutional: He is oriented to person, place, and time. He appears well-developed and well-nourished. No distress.  HENT:  Head: Normocephalic and atraumatic.  Right Ear: External ear normal.  Left Ear: External ear normal.  Nose: Nose normal.  Mouth/Throat: Oropharynx is clear and moist. No oropharyngeal exudate.  Cardiovascular: Normal rate, regular rhythm, normal heart sounds and intact distal pulses.  Exam reveals no gallop and no friction rub.   No murmur heard. Pulmonary/Chest: Effort normal and breath sounds normal. No respiratory distress. He has no wheezes. He has no rales. He exhibits no tenderness.  Abdominal: Soft. Bowel sounds are normal. He exhibits no distension and no mass. There is no tenderness. There is no rebound and no guarding.  Musculoskeletal: Normal range of motion. He exhibits no edema, tenderness or deformity.  Neurological: He is alert and oriented to person, place, and time.  Skin: Skin is warm and dry. No rash  noted. He is not diaphoretic. No erythema. No pallor.  Psychiatric: He has a normal mood and affect. His behavior is normal. Judgment and thought content normal.  Nursing note and vitals reviewed.     Assessment & Plan:  1. Acute gastritis without hemorrhage, unspecified gastritis type - Possible GI bug. Will get labs and make sure his electrolytes are not plummeting again  - CBC with Differential/Platelet - Magnesium - CMP - Stay hydrated - Quit smoking   [name], NP

## 2017-02-14 ENCOUNTER — Telehealth: Payer: Self-pay | Admitting: Adult Health

## 2017-02-14 DIAGNOSIS — E871 Hypo-osmolality and hyponatremia: Secondary | ICD-10-CM

## 2017-02-14 NOTE — Telephone Encounter (Signed)
Pt is aware nurse will call him back

## 2017-02-14 NOTE — Telephone Encounter (Signed)
Pt returning the call for his lab results

## 2017-02-14 NOTE — Telephone Encounter (Signed)
Updated patient on his labs. Advised him to start OTC magnesium and increase salt in his diet.   If he feels worse over the weekend then needs to go to the ER  Follow up Monday for lab redraw

## 2017-02-17 ENCOUNTER — Other Ambulatory Visit (INDEPENDENT_AMBULATORY_CARE_PROVIDER_SITE_OTHER): Payer: BLUE CROSS/BLUE SHIELD

## 2017-02-17 DIAGNOSIS — K2900 Acute gastritis without bleeding: Secondary | ICD-10-CM

## 2017-02-17 LAB — MAGNESIUM: MAGNESIUM: 1.6 mg/dL (ref 1.5–2.5)

## 2017-02-17 LAB — BASIC METABOLIC PANEL
BUN: 4 mg/dL — AB (ref 6–23)
CO2: 28 meq/L (ref 19–32)
CREATININE: 0.66 mg/dL (ref 0.40–1.50)
Calcium: 9.5 mg/dL (ref 8.4–10.5)
Chloride: 94 mEq/L — ABNORMAL LOW (ref 96–112)
GFR: 130.39 mL/min (ref >60.00)
GLUCOSE: 136 mg/dL — AB (ref 70–99)
POTASSIUM: 4.1 meq/L (ref 3.5–5.1)
Sodium: 128 mEq/L — ABNORMAL LOW (ref 135–145)

## 2017-02-17 NOTE — Addendum Note (Signed)
Addended by: Elmer Picker on: 02/17/2017 08:56 AM   Modules accepted: Orders

## 2017-02-18 ENCOUNTER — Telehealth: Payer: Self-pay | Admitting: Cardiovascular Disease

## 2017-02-18 NOTE — Telephone Encounter (Signed)
According  to discharge note of April 2018, hospital stopped this medicine Diovan, spoke to pharmacy let them know if patient is still taking medicine we did not prescribe it and also it wasn't on his last OV with Primary Care.

## 2017-02-18 NOTE — Telephone Encounter (Signed)
New message   Daphane Shepherd from [facility] is calling for pt. Pt c/o medication issue:  1. Name of Medication: Diovan  2. How are you currently taking this medication (dosage and times per day)? 160 mg  3. Are you having a reaction (difficulty breathing--STAT)?   4. What is your medication issue? Being recalled.

## 2017-02-18 NOTE — Telephone Encounter (Signed)
S/w Talha Iser (wife) (DPR) she states that she/pt was not told at d/c in April to stop Diovan and amlodipine she will stop now and log BP every day and log for review at scheduled appt 03-05-17. She will call back if BP becomes abnormal or any new sx develop.

## 2017-02-18 NOTE — Telephone Encounter (Signed)
New message     Pt c/o medication issue:  1. Name of Medication: valsartan  2. How are you currently taking this medication (dosage and times per day)? 1 times a day   3. Are you having a reaction (difficulty breathing--STAT)?  no  4. What is your medication issue? Recall

## 2017-02-20 DIAGNOSIS — Z20818 Contact with and (suspected) exposure to other bacterial communicable diseases: Secondary | ICD-10-CM | POA: Diagnosis not present

## 2017-02-20 DIAGNOSIS — J029 Acute pharyngitis, unspecified: Secondary | ICD-10-CM | POA: Diagnosis not present

## 2017-03-05 ENCOUNTER — Encounter: Payer: Self-pay | Admitting: Physician Assistant

## 2017-03-05 ENCOUNTER — Ambulatory Visit (INDEPENDENT_AMBULATORY_CARE_PROVIDER_SITE_OTHER): Payer: BLUE CROSS/BLUE SHIELD | Admitting: Physician Assistant

## 2017-03-05 VITALS — BP 150/86 | HR 68 | Ht 68.0 in | Wt 126.8 lb

## 2017-03-05 DIAGNOSIS — E785 Hyperlipidemia, unspecified: Secondary | ICD-10-CM | POA: Diagnosis not present

## 2017-03-05 DIAGNOSIS — E876 Hypokalemia: Secondary | ICD-10-CM | POA: Diagnosis not present

## 2017-03-05 DIAGNOSIS — Z79899 Other long term (current) drug therapy: Secondary | ICD-10-CM | POA: Diagnosis not present

## 2017-03-05 DIAGNOSIS — I10 Essential (primary) hypertension: Secondary | ICD-10-CM | POA: Diagnosis not present

## 2017-03-05 DIAGNOSIS — I2510 Atherosclerotic heart disease of native coronary artery without angina pectoris: Secondary | ICD-10-CM | POA: Diagnosis not present

## 2017-03-05 LAB — COMPREHENSIVE METABOLIC PANEL
ALK PHOS: 66 IU/L (ref 39–117)
ALT: 14 IU/L (ref 0–44)
AST: 28 IU/L (ref 0–40)
Albumin/Globulin Ratio: 1.8 (ref 1.2–2.2)
Albumin: 4.4 g/dL (ref 3.6–4.8)
BUN/Creatinine Ratio: 7 — ABNORMAL LOW (ref 10–24)
BUN: 4 mg/dL — ABNORMAL LOW (ref 8–27)
Bilirubin Total: 0.6 mg/dL (ref 0.0–1.2)
CALCIUM: 9.5 mg/dL (ref 8.6–10.2)
CO2: 22 mmol/L (ref 20–29)
CREATININE: 0.59 mg/dL — AB (ref 0.76–1.27)
Chloride: 91 mmol/L — ABNORMAL LOW (ref 96–106)
GFR calc Af Amer: 126 mL/min/{1.73_m2} (ref >59)
GFR, EST NON AFRICAN AMERICAN: 109 mL/min/{1.73_m2} (ref >59)
Globulin, Total: 2.4 g/dL (ref 1.5–4.5)
Glucose: 94 mg/dL (ref 65–99)
POTASSIUM: 4.6 mmol/L (ref 3.5–5.2)
Sodium: 130 mmol/L — ABNORMAL LOW (ref 134–144)
Total Protein: 6.8 g/dL (ref 6.0–8.5)

## 2017-03-05 LAB — LIPID PANEL
CHOL/HDL RATIO: 2.7 ratio (ref 0.0–5.0)
CHOLESTEROL TOTAL: 162 mg/dL (ref 100–199)
HDL: 59 mg/dL (ref >39)
LDL CALC: 73 mg/dL (ref 0–99)
TRIGLYCERIDES: 149 mg/dL (ref 0–149)
VLDL Cholesterol Cal: 30 mg/dL (ref 5–40)

## 2017-03-05 LAB — MAGNESIUM: Magnesium: 1.6 mg/dL (ref 1.6–2.3)

## 2017-03-05 MED ORDER — AMLODIPINE BESYLATE 5 MG PO TABS: 5.0000 mg | ORAL_TABLET | Freq: Every day | ORAL | 1 refills | Status: DC

## 2017-03-05 NOTE — Patient Instructions (Signed)
Medication Instructions:  RESTART AMLODIPINE 5MG  DAILY If you need a refill on your cardiac medications before your next appointment, please call your pharmacy.  Labwork: LIPID,CMET AND MAG TODAY HERE IN OUR OFFICE AT [facility]  Follow-Up: Your physician wants you to follow-up in: December [place].   Thank you for choosing [facility] at [facility]!!

## 2017-03-05 NOTE — Progress Notes (Signed)
Cardiology Office Note   Date:  03/07/2017   ID:  Sean, Rose Dec 06, 1955, MRN [id number]  PCP:  Sean Peng, NP  Cardiologist:  Dr. Gwenlyn Found, 07/12/2016  Rosaria Ferries, PA-C 05/12/2015  Chief Complaint  Patient presents with  . Follow-up    bp  . Fatigue  . Headache    History of Present Illness: Sean Rose is a 61 y.o. male with a history of CAD (BMS to RCA in 2007 with in-stent restenosis during 2008), PVD (s/p R common iliac stent - 2007), HTN, HLD, tobacco abuse, L-CIA stent 03/2015, COPD   10/2016 hospitalization overnight for electrolyte supplementation.   Sean Rose presents for cardiology follow up.  He had a GI illness in April that was quit severe. He developed very low magnesium and potassium levels, was hospitalized. He was taken off the Diovan and amlodipine at that time.   June was a good month. July was bad. Strep throat, that was treated and has resolved.   He has generally felt bad, associates this with not being back on his BP meds. He brings his BP readings in today, most are over 140, one over 160.   He is still smoking. He has cut back, is not really trying to quit. He feels the job stress and other stressors as well as his innate hyperactivity level make it hard for him to quit. He feels that he walks >10 min at a time without problems. Can mow the lawn or go hiking without feeling SOB, but has not gone hiking recently.  He feels tired all the time. This has been going on for a couple of months. No recent labs, pt is reluctant to get a TSH check, will pursue this with his PCP. He agrees to get other labs done.    Past Medical History:  Diagnosis Date  . Anginal pain ([place])   . CAD (coronary artery disease)   . CAP (community acquired pneumonia) 09/2016  . COPD (chronic obstructive pulmonary disease) ([place])   . GERD (gastroesophageal reflux disease)   . Heart murmur    "I was told I had one when I was a kid"  . Hemorrhoids   .  History of anal fissures    "no surgeries" (10/30/2016)  . Hyperlipidemia   . Hypertension   . Peripheral arterial disease ([place])    status post right common iliac artery stenting back in 2007  . Seasonal allergies   . Tobacco abuse     Past Surgical History:  Procedure Laterality Date  . ANKLE SURGERY Left    "rebuilt it"  . ANTERIOR CRUCIATE LIGAMENT REPAIR Right   . CARDIAC CATHETERIZATION  09/23/2008   Continued medical therapy - may need GI evaluation in addition.  Marland Kitchen CARDIAC CATHETERIZATION  10/28/2007   Medical therapy recommended.  Marland Kitchen CARDIAC CATHETERIZATION  11/18/2006   In-stent restenosis RCA  (50% distal edge, 80% segmental mid, and 50-60% segmental proximal). Successful cutting balloon atherectomy using a 325X15 cutting balloon. 3 inflations with atherectomy performed on mid and proximal portions resulting in reduction of 80% mid in-stent restenosis to less than 20% residual and 50-60% segmental proximal to less than 20% residual without dissection.  Marland Kitchen CARDIAC CATHETERIZATION  02/26/2006   Severe stenosis in RCA. Stenting performed using IVUS. 3.5x20 Maverick balloon deployed at [employer]. Distal stent-a 4x28 Liberte stent-deployed 12atm 48sec, 12atm 31sec, 4atm 19sec. Mid stent-a 4x28 Liberte stent-deployed 14atm 45sec, 14atm 60sec, 14atm 44sec. Proximal stent-4x8 Liberte- 14atm 45sec,14atm  47sec, 16atm 43sec. Severely diseased segment then appeared TIMI-3 flow.  Marland Kitchen CARDIOVASCULAR STRESS TEST  11/17/2012   No significant ECG changes. Septal perfusion defect is new when complared to study from 2010. Abnormal myocardial perfusion imaging with a basal to mid perfusion suggestive of previous MI.  Marland Kitchen CAROTID DOPPLER  08/09/2011   Bilateral Bulb/Proximal ICA - demonstrated a mild amount of fibrous plaque without evidence of significant diameter reduction reduction or other vascular abnormality.  . COLONOSCOPY     2003, 2014  . CORONARY ANGIOPLASTY WITH STENT PLACEMENT    . FEMORAL ARTERY  STENT    . INGUINAL HERNIA REPAIR Right   . KNEE ARTHROSCOPY Right "multiple"  . LOWER EXTREMITY ARTERIAL DOPPLER  01/31/2011   Bilateral ABIs-normal values with no suggestion of arterial insuff to the lower extremities at rest. Right CIA stent-mild amount of nonhemodynamically significant plaque is noted throughout  . 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 PERCUTANEOUS STENT INTERVENTION  04/04/2006 & 04/13/2015   a. Right common iliac artery with an 8.0x18 mm Herculink stent deployed at 12 atm. Stenosis was reduced from 80% to 0% with brisk flow. b. I-cast stenting to left common iliac artery  . PERIPHERAL VASCULAR CATHETERIZATION N/A 04/13/2015   Procedure: Lower Extremity Angiography;  Surgeon: Lorretta Harp, MD; L-oCIA 75%, 40-50% L-EIA, R-CIA stent patent, s/p 8 mm x 38 mm ICast covered stent>>0% stenosis in L-oCIA     . SHOULDER ARTHROSCOPY WITH ROTATOR CUFF REPAIR Right   . TRANSTHORACIC ECHOCARDIOGRAM  11/26/2012   EF not noted. Aortic valve-sclerosis without stenosis, no regurgiation.   Marland Kitchen UPPER GASTROINTESTINAL ENDOSCOPY    . US CAROTID DOPPLER BILATERAL ([place] [facility])  08/09/2011   Bilateral Bulb/Proximal ICAa demonstrated a mild amount of fibrous plaque without evidence of significant diameter reduction or any other vascular abnormality.    Current Outpatient Prescriptions  Medication Sig Dispense Refill  . aspirin EC 81 MG tablet Take 81 mg by mouth daily.    . clopidogrel (PLAVIX) 75 MG tablet Take 1 tablet (75 mg total) by mouth daily. 30 tablet 11  . diphenhydrAMINE (BENADRYL) 25 mg capsule Take 25 mg by mouth every 6 (six) hours as needed for allergies.    Marland Kitchen esomeprazole (NEXIUM) 40 MG capsule Take 40 mg by mouth daily.     Marland Kitchen ibuprofen (ADVIL,MOTRIN) 200 MG tablet Take 200 mg by mouth every 6 (six) hours as needed for headache.    . Magnesium 250 MG TABS Take 1 tablet by mouth daily.    . metoprolol succinate (TOPROL-XL) 100 MG 24 hr tablet Take 1 tablet (100 mg total) by  mouth daily. 30 tablet 11  . Multiple Vitamin (MULTIVITAMIN WITH MINERALS) TABS tablet Take 1 tablet by mouth daily.    . nitroGLYCERIN (NITROSTAT) 0.4 MG SL tablet Place 1 tablet (0.4 mg total) under the tongue every 5 (five) minutes as needed for chest pain. 25 tablet 3  . ondansetron (ZOFRAN) 4 MG tablet Take 1 tablet (4 mg total) by mouth every 8 (eight) hours as needed for nausea or vomiting. 20 tablet 0   No current facility-administered medications for this visit.     Allergies:   Oxycodone-acetaminophen and Tylox [oxycodone-acetaminophen]    Social History:  The patient  reports that he has been smoking Cigarettes.  He has a 57.00 pack-year smoking history. He has never used smokeless tobacco. He reports that he drinks about 21.6 oz of alcohol per week . He reports that he does  not use drugs.   Family History:  The patient's family history includes Colon cancer in his mother; Heart disease in his father and paternal grandfather.    ROS:  Please see the history of present illness. All other systems are reviewed and negative.    PHYSICAL EXAM: VS:  BP (!) 150/86   Pulse 68   Ht '5\' 8"'  (1.727 m)   Wt 126 lb 12.8 oz (57.5 kg)   SpO2 98%   BMI 19.28 kg/m  , BMI Body mass index is 19.28 kg/m. GEN: Well nourished, well developed, male in no acute distress  HEENT: normal for age  Neck: no JVD, no carotid bruit, no masses Cardiac: RRR; soft murmur, no rubs, or gallops Respiratory: few scattered rales bases bilaterally, normal work of breathing GI: soft, nontender, nondistended, + BS MS: no deformity or atrophy; no edema; distal pulses are 2+ in all 4 extremities   Skin: warm and dry, no rash Neuro:  Strength and sensation are intact Psych: euthymic mood, full affect   EKG:  EKG is not ordered today.  Recent Labs: 02/13/2017: Hemoglobin 15.3; Platelets 339.0 03/05/2017: ALT 14; BUN 4; Creatinine, Ser 0.59; Magnesium 1.6; Potassium 4.6; Sodium 130    Lipid Panel    Component  Value Date/Time   CHOL 162 03/05/2017 1110   TRIG 149 03/05/2017 1110   HDL 59 03/05/2017 1110   CHOLHDL 2.7 03/05/2017 1110   CHOLHDL 4.4 12/18/2012 0814   VLDL 45 (H) 12/18/2012 0814   LDLCALC 73 03/05/2017 1110     Wt Readings from Last 3 Encounters:  03/05/17 126 lb 12.8 oz (57.5 kg)  02/13/17 128 lb 9.6 oz (58.3 kg)  10/31/16 128 lb 6.4 oz (58.2 kg)     Other studies Reviewed: Additional studies/ records that were reviewed today include: office notes, hospital records and testing.  ASSESSMENT AND PLAN:  1.  CAD: no ischemic sx, continue ASA, Plavix, BB, prn NTG. No testing needed.  2. HTN: restart amlodipine 5 mg, increase as needed for BP control.  3. Tobacco use: discussed cessation, he will tryp to keep cutting back.  4. Hyperlipidemia: ck lipid and liver tests  5. Fatigue: pt reluctant to get TSH, will ck CBC and electrolytes, he has had hypokalemia in the past  Current medicines are reviewed at length with the patient today.  The patient has concerns regarding medicines.  The following changes have been made:  Restart amlodipine  Labs/ tests ordered today include:   Orders Placed This Encounter  Procedures  . Comprehensive metabolic panel  . Lipid panel  . Magnesium     Disposition:   FU with Dr. Gwenlyn Found  Signed, Rosaria Ferries, PA-C  03/07/2017 9:52 AM    [place] Phone: [phone]; Fax: [phone]  This note was written with the assistance of speech recognition software. Please excuse any transcriptional errors.

## 2017-03-26 ENCOUNTER — Other Ambulatory Visit: Payer: Self-pay | Admitting: Cardiovascular Disease

## 2017-04-18 ENCOUNTER — Encounter: Payer: Self-pay | Admitting: Adult Health

## 2017-05-05 ENCOUNTER — Ambulatory Visit (INDEPENDENT_AMBULATORY_CARE_PROVIDER_SITE_OTHER): Payer: BLUE CROSS/BLUE SHIELD | Admitting: Adult Health

## 2017-05-05 ENCOUNTER — Encounter: Payer: Self-pay | Admitting: Family Medicine

## 2017-05-05 ENCOUNTER — Telehealth: Payer: Self-pay | Admitting: Adult Health

## 2017-05-05 ENCOUNTER — Encounter: Payer: Self-pay | Admitting: Adult Health

## 2017-05-05 VITALS — BP 152/84 | Temp 98.1°F | Wt 128.0 lb

## 2017-05-05 DIAGNOSIS — R110 Nausea: Secondary | ICD-10-CM

## 2017-05-05 LAB — CBC WITH DIFFERENTIAL/PLATELET
BASOS ABS: 0.1 10*3/uL (ref 0.0–0.1)
Basophils Relative: 0.6 % (ref 0.0–3.0)
EOS ABS: 0 10*3/uL (ref 0.0–0.7)
Eosinophils Relative: 0.2 % (ref 0.0–5.0)
HEMATOCRIT: 43.2 % (ref 39.0–52.0)
HEMOGLOBIN: 15.3 g/dL (ref 13.0–17.0)
Lymphocytes Relative: 15.6 % (ref 12.0–46.0)
Lymphs Abs: 1.3 10*3/uL (ref 0.7–4.0)
MCHC: 35.4 g/dL (ref 30.0–36.0)
MCV: 106.5 fl — ABNORMAL HIGH (ref 78.0–100.0)
MONO ABS: 0.6 10*3/uL (ref 0.1–1.0)
Monocytes Relative: 6.6 % (ref 3.0–12.0)
Neutro Abs: 6.6 10*3/uL (ref 1.4–7.7)
Neutrophils Relative %: 77 % (ref 43.0–77.0)
Platelets: 376 10*3/uL (ref 150.0–400.0)
RBC: 4.05 Mil/uL — ABNORMAL LOW (ref 4.22–5.81)
RDW: 14 % (ref 11.5–15.5)
WBC: 8.5 10*3/uL (ref 4.0–10.5)

## 2017-05-05 LAB — BASIC METABOLIC PANEL
BUN: 3 mg/dL — AB (ref 6–23)
CHLORIDE: 95 meq/L — AB (ref 96–112)
CO2: 27 mEq/L (ref 19–32)
Calcium: 9.7 mg/dL (ref 8.4–10.5)
Creatinine, Ser: 0.57 mg/dL (ref 0.40–1.50)
GFR: 154.32 mL/min (ref >60.00)
Glucose, Bld: 123 mg/dL — ABNORMAL HIGH (ref 70–99)
POTASSIUM: 4 meq/L (ref 3.5–5.1)
SODIUM: 130 meq/L — AB (ref 135–145)

## 2017-05-05 LAB — MAGNESIUM: Magnesium: 1.7 mg/dL (ref 1.5–2.5)

## 2017-05-05 LAB — TSH: TSH: 1.11 u[IU]/mL (ref 0.35–4.50)

## 2017-05-05 NOTE — Telephone Encounter (Addendum)
Pt would like a call back asap concerning labs done this am. Pt states he cannot interpret from [place], needs to travel and concerned if ok for him to do so.  Pt states he really does not feel well.

## 2017-05-05 NOTE — Progress Notes (Signed)
Subjective:    Patient ID: Sean Rose, male    DOB: 1956-02-21, 61 y.o.   MRN: [id number]  HPI  61 year old male who  has a past medical history of Anginal pain ([place]); CAD (coronary artery disease); CAP (community acquired pneumonia) (09/2016); COPD (chronic obstructive pulmonary disease) ([place]); GERD (gastroesophageal reflux disease); Heart murmur; Hemorrhoids; History of anal fissures; Hyperlipidemia; Hypertension; Peripheral arterial disease ([place]); Seasonal allergies; and Tobacco abuse. He presents to the office today for the complaint of " not feeling well". His symptoms include that of nausea, hot flashes, lightheadedness, and shaking. He reports " I feel like I did the last few times my magnesium and potassium were low" His symptoms have been present for less than 24 hours.   He denies any vomiting,headaches, diarrhea, or fevers.   He has not experienced any syncope   He is taking his Magnesium supplement    Review of Systems See HPI   Past Medical History:  Diagnosis Date  . Anginal pain ([place])   . CAD (coronary artery disease)   . CAP (community acquired pneumonia) 09/2016  . COPD (chronic obstructive pulmonary disease) ([place])   . GERD (gastroesophageal reflux disease)   . Heart murmur    "I was told I had one when I was a kid"  . Hemorrhoids   . History of anal fissures    "no surgeries" (10/30/2016)  . Hyperlipidemia   . Hypertension   . Peripheral arterial disease ([place])    status post right common iliac artery stenting back in 2007  . Seasonal allergies   . Tobacco abuse     Social History   Social History  . Marital status: Married    Spouse name: N/A  . Number of children: N/A  . Years of education: N/A   Occupational History  . Not on file.   Social History Main Topics  . Smoking status: Current Every Day Smoker    Packs/day: 1.50    Years: 38.00    Types: Cigarettes  . Smokeless tobacco: Never Used  . Alcohol use 21.6 oz/week    36 Cans of beer  per week     Comment: 10/30/2016 "I usually drink 4-8 beers qd"  . Drug use: No  . Sexual activity: Yes   Other Topics Concern  . Not on file   Social History Narrative  . No narrative on file    Past Surgical History:  Procedure Laterality Date  . ANKLE SURGERY Left    "rebuilt it"  . ANTERIOR CRUCIATE LIGAMENT REPAIR Right   . CARDIAC CATHETERIZATION  09/23/2008   Continued medical therapy - may need GI evaluation in addition.  Marland Kitchen CARDIAC CATHETERIZATION  10/28/2007   Medical therapy recommended.  Marland Kitchen CARDIAC CATHETERIZATION  11/18/2006   In-stent restenosis RCA  (50% distal edge, 80% segmental mid, and 50-60% segmental proximal). Successful cutting balloon atherectomy using a 325X15 cutting balloon. 3 inflations with atherectomy performed on mid and proximal portions resulting in reduction of 80% mid in-stent restenosis to less than 20% residual and 50-60% segmental proximal to less than 20% residual without dissection.  Marland Kitchen CARDIAC CATHETERIZATION  02/26/2006   Severe stenosis in RCA. Stenting performed using IVUS. 3.5x20 Maverick balloon deployed at [facility]. Distal stent-a 4x28 Liberte stent-deployed 12atm 48sec, 12atm 31sec, 4atm 19sec. Mid stent-a 4x28 Liberte stent-deployed 14atm 45sec, 14atm 60sec, 14atm 44sec. Proximal stent-4x8 Liberte- 14atm 45sec,14atm 47sec, 16atm 43sec. Severely diseased segment then appeared TIMI-3 flow.  Marland Kitchen CARDIOVASCULAR STRESS TEST  11/17/2012   No significant ECG changes. Septal perfusion defect is new when complared to study from 2010. Abnormal myocardial perfusion imaging with a basal to mid perfusion suggestive of previous MI.  Marland Kitchen CAROTID DOPPLER  08/09/2011   Bilateral Bulb/Proximal ICA - demonstrated a mild amount of fibrous plaque without evidence of significant diameter reduction reduction or other vascular abnormality.  . COLONOSCOPY     2003, 2014  . CORONARY ANGIOPLASTY WITH STENT PLACEMENT    . FEMORAL ARTERY STENT    . INGUINAL HERNIA REPAIR Right     . KNEE ARTHROSCOPY Right "multiple"  . LOWER EXTREMITY ARTERIAL DOPPLER  01/31/2011   Bilateral ABIs-normal values with no suggestion of arterial insuff to the lower extremities at rest. Right CIA stent-mild amount of nonhemodynamically significant plaque is noted throughout  . 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 PERCUTANEOUS STENT INTERVENTION  04/04/2006 & 04/13/2015   a. Right common iliac artery with an 8.0x18 mm Herculink stent deployed at 12 atm. Stenosis was reduced from 80% to 0% with brisk flow. b. I-cast stenting to left common iliac artery  . PERIPHERAL VASCULAR CATHETERIZATION N/A 04/13/2015   Procedure: Lower Extremity Angiography;  Surgeon: Lorretta Harp, MD; L-oCIA 75%, 40-50% L-EIA, R-CIA stent patent, s/p 8 mm x 38 mm ICast covered stent>>0% stenosis in L-oCIA     . SHOULDER ARTHROSCOPY WITH ROTATOR CUFF REPAIR Right   . TRANSTHORACIC ECHOCARDIOGRAM  11/26/2012   EF not noted. Aortic valve-sclerosis without stenosis, no regurgiation.   Marland Kitchen UPPER GASTROINTESTINAL ENDOSCOPY    . US CAROTID DOPPLER BILATERAL ([place] [facility]X)  08/09/2011   Bilateral Bulb/Proximal ICAa demonstrated a mild amount of fibrous plaque without evidence of significant diameter reduction or any other vascular abnormality.    Family History  Problem Relation Age of Onset  . Colon cancer Mother   . Heart disease Father   . Heart disease Paternal Grandfather     Allergies  Allergen Reactions  . Oxycodone-Acetaminophen Nausea Only  . Tylox [Oxycodone-Acetaminophen] Nausea Only    Current Outpatient Prescriptions on File Prior to Visit  Medication Sig Dispense Refill  . amLODipine (NORVASC) 5 MG tablet Take 1 tablet (5 mg total) by mouth daily. 90 tablet 1  . aspirin EC 81 MG tablet Take 81 mg by mouth daily.    . clopidogrel (PLAVIX) 75 MG tablet Take 1 tablet (75 mg total) by mouth daily. 30 tablet 11  . diphenhydrAMINE (BENADRYL) 25 mg capsule Take 25 mg by mouth every 6 (six) hours as needed for  allergies.    Marland Kitchen esomeprazole (NEXIUM) 40 MG capsule Take 40 mg by mouth daily.     Marland Kitchen ibuprofen (ADVIL,MOTRIN) 200 MG tablet Take 200 mg by mouth every 6 (six) hours as needed for headache.    . Magnesium 250 MG TABS Take 1 tablet by mouth daily.    . metoprolol succinate (TOPROL-XL) 100 MG 24 hr tablet Take 1 tablet (100 mg total) by mouth daily. 30 tablet 11  . Multiple Vitamin (MULTIVITAMIN WITH MINERALS) TABS tablet Take 1 tablet by mouth daily.    . nitroGLYCERIN (NITROSTAT) 0.4 MG SL tablet PLACE 1 TABLET UNDER THE TONGUE EVERY 5 MINUTES AS NEEDED FOR CHEST PAIN 25 tablet 0  . ondansetron (ZOFRAN) 4 MG tablet Take 1 tablet (4 mg total) by mouth every 8 (eight) hours as needed for nausea or vomiting. 20 tablet 0   No current facility-administered medications on file prior to visit.  BP (!) 152/84 (BP Location: Left Arm)   Temp 98.1 F (36.7 C) (Oral)   Wt 128 lb (58.1 kg)   BMI 19.46 kg/m       Objective:   Physical Exam  Constitutional: He is oriented to person, place, and time. He appears well-developed and well-nourished. No distress.  Neck: Normal range of motion.  Cardiovascular: Normal rate, regular rhythm, normal heart sounds and intact distal pulses.  Exam reveals no gallop and no friction rub.   No murmur heard. Pulmonary/Chest: Effort normal and breath sounds normal. No respiratory distress. He has no wheezes. He has no rales. He exhibits no tenderness.  Abdominal: Soft. Bowel sounds are normal. He exhibits no distension and no mass. There is no tenderness. There is no rebound and no guarding.  Musculoskeletal: He exhibits no edema, tenderness or deformity.  Neurological: He is alert and oriented to person, place, and time.  Skin: Skin is warm and dry. No rash noted. He is not diaphoretic. No erythema. No pallor.  Psychiatric: He has a normal mood and affect. His behavior is normal. Judgment and thought content normal.  Vitals reviewed.     Assessment & Plan:    Will check TSH, BMP, Magnesium, and CBC. Consider replacement. He may need to be on chronic potassium supplement.   BP is elevated in the office today. Cardiology is managing his blood pressure and advised to follow up.   Stay hydrated  Quit smoking   [name], NP

## 2017-05-05 NOTE — Telephone Encounter (Signed)
Pt notified of results.  See result note. 

## 2017-07-15 ENCOUNTER — Encounter: Payer: Self-pay | Admitting: Cardiovascular Disease

## 2017-07-15 ENCOUNTER — Ambulatory Visit (INDEPENDENT_AMBULATORY_CARE_PROVIDER_SITE_OTHER): Payer: BLUE CROSS/BLUE SHIELD | Admitting: Cardiovascular Disease

## 2017-07-15 VITALS — BP 146/84 | HR 62 | Ht 68.5 in | Wt 131.0 lb

## 2017-07-15 DIAGNOSIS — I10 Essential (primary) hypertension: Secondary | ICD-10-CM

## 2017-07-15 DIAGNOSIS — I2510 Atherosclerotic heart disease of native coronary artery without angina pectoris: Secondary | ICD-10-CM | POA: Diagnosis not present

## 2017-07-15 DIAGNOSIS — I739 Peripheral vascular disease, unspecified: Secondary | ICD-10-CM

## 2017-07-15 DIAGNOSIS — E7800 Pure hypercholesterolemia, unspecified: Secondary | ICD-10-CM | POA: Diagnosis not present

## 2017-07-15 NOTE — Assessment & Plan Note (Signed)
History of hyperlipidemia not on statin therapy with lipid profile performed 03/05/17 revealing a LDL of 73 and HDL 59.

## 2017-07-15 NOTE — Assessment & Plan Note (Signed)
History of peripheral arterial disease status post remote right common iliac artery stenting by myself in 2007. Because of recurrent claudication I re-angiogram him 04/13/15 revealing a patent right iliac stent moderate left iliac stenosis to 40 mm gradient. I extended this with an 8 mm x 30 mm long I cast covered stent and excellent result. His last Dopplers performed 08/16/16 revealed normal ABIs with widely patent stents.

## 2017-07-15 NOTE — Assessment & Plan Note (Signed)
History of essential hypertension with blood pressures measured at 146/84. He is on amlodipine and metoprolol. Continue current meds at current dosing.

## 2017-07-15 NOTE — Progress Notes (Signed)
07/15/2017 Sean Rose   May 19, 1956  [id number]  Primary Physician [name], NP Primary Cardiologist: Lorretta Harp MD FACP, [place], [place], [place]  HPI:  Sean Rose is a 61 y.o.  thin appearing married Caucasian male father of 2 who I last saw in the office  07/12/16. He has a history of RCA stenting in the past with bare metal stents. He has had in-stent restenosis with re-dilatation approximately 4 years ago. He has also had remote right common iliac artery PTA and stenting back in 2007. His other problems include hypertension, hyperlipidemia and continued tobacco abuse at  1-1/2 packs per day despite counseling to the contrary. He denies chest pain , shortness of breath . He was complaining of some claudication back in September and we ultimately proceeded with angiography 04/13/15 revealing a widely patent right iliac stent with 75% ostial left common iliac artery stenosis with a 40 mm gradient. I ultimately stented this with an 8 mm x 38 mm long ICast  covered stent with excellent angiographic clinical and ultrasonographic result. He no longer has claudication. His most recent Dopplers performed 08/16/16 revealed normal ABIs by bilaterally with widely patent stents. Since I saw him a year ago he's remained stable. He denies chest pain or shortness of breath.     No outpatient medications have been marked as taking for the 07/15/17 encounter (Office Visit) with Lorretta Harp, MD.     Allergies  Allergen Reactions  . Oxycodone-Acetaminophen Nausea Only  . Tylox [Oxycodone-Acetaminophen] Nausea Only    Social History   Socioeconomic History  . Marital status: Married    Spouse name: Not on file  . Number of children: Not on file  . Years of education: Not on file  . Highest education level: Not on file  Social Needs  . Financial resource strain: Not on file  . Food insecurity - worry: Not on file  . Food insecurity - inability: Not on file  . Transportation  needs - medical: Not on file  . Transportation needs - non-medical: Not on file  Occupational History  . Not on file  Tobacco Use  . Smoking status: Current Every Day Smoker    Packs/day: 1.50    Years: 38.00    Pack years: 57.00    Types: Cigarettes  . Smokeless tobacco: Never Used  Substance and Sexual Activity  . Alcohol use: Yes    Alcohol/week: 21.6 oz    Types: 36 Cans of beer per week    Comment: 10/30/2016 "I usually drink 4-8 beers qd"  . Drug use: No  . Sexual activity: Yes  Other Topics Concern  . Not on file  Social History Narrative  . Not on file     Review of Systems: General: negative for chills, fever, night sweats or weight changes.  Cardiovascular: negative for chest pain, dyspnea on exertion, edema, orthopnea, palpitations, paroxysmal nocturnal dyspnea or shortness of breath Dermatological: negative for rash Respiratory: negative for cough or wheezing Urologic: negative for hematuria Abdominal: negative for nausea, vomiting, diarrhea, bright red blood per rectum, melena, or hematemesis Neurologic: negative for visual changes, syncope, or dizziness All other systems reviewed and are otherwise negative except as noted above.    Blood pressure (!) 146/84, pulse 62, height 5' 8.5" (1.74 m), weight 131 lb (59.4 kg).  General appearance: alert and no distress Neck: no adenopathy, no carotid bruit, no JVD, supple, symmetrical, trachea midline and thyroid not enlarged, symmetric, no tenderness/mass/nodules Lungs:  clear to auscultation bilaterally Heart: regular rate and rhythm, S1, S2 normal, no murmur, click, rub or gallop Extremities: extremities normal, atraumatic, no cyanosis or edema Pulses: 2+ and symmetric Skin: Skin color, texture, turgor normal. No rashes or lesions Neurologic: Alert and oriented X 3, normal strength and tone. Normal symmetric reflexes. Normal coordination and gait  EKG sinus rhythm at 62 without ST or T-wave changes. I personally  reviewed this EKG  ASSESSMENT AND PLAN:   PVD (peripheral vascular disease) History of peripheral arterial disease status post remote right common iliac artery stenting by myself in 2007. Because of recurrent claudication I re-angiogram him 04/13/15 revealing a patent right iliac stent moderate left iliac stenosis to 40 mm gradient. I extended this with an 8 mm x 30 mm long I cast covered stent and excellent result. His last Dopplers performed 08/16/16 revealed normal ABIs with widely patent stents.  Coronary atherosclerosis of native coronary artery History of CAD status post RCA stenting in the past bare-metal stents. He had in-stent restenosis with redilatation approximate 5 years ago. He denies chest pain or shortness of breath.  Essential hypertension History of essential hypertension with blood pressures measured at 146/84. He is on amlodipine and metoprolol. Continue current meds at current dosing.  Hyperlipidemia History of hyperlipidemia not on statin therapy with lipid profile performed 03/05/17 revealing a LDL of 73 and HDL 59.      Lorretta Harp MD [facility], [facility] 07/15/2017 2:50 PM

## 2017-07-15 NOTE — Assessment & Plan Note (Signed)
History of CAD status post RCA stenting in the past bare-metal stents. He had in-stent restenosis with redilatation approximate 5 years ago. He denies chest pain or shortness of breath.

## 2017-07-15 NOTE — Patient Instructions (Signed)
Medication Instructions: Your physician recommends that you continue on your current medications as directed. Please refer to the Current Medication list given to you today.   Testing/Procedures: Your physician has requested that you have a lower extremity arterial duplex. During this test, ultrasound is used to evaluate arterial blood flow in the legs. Allow one hour for this exam. There are no restrictions or special instructions.  Your physician has requested that you have an ankle brachial index (ABI). During this test an ultrasound and blood pressure cuff are used to evaluate the arteries that supply the arms and legs with blood. Allow thirty minutes for this exam. There are no restrictions or special instructions.  Follow-Up: Your physician wants you to follow-up in: 1 year with Dr. Gwenlyn Found. You will receive a reminder letter in the mail two months in advance. If you don't receive a letter, please call our office to schedule the follow-up appointment.  If you need a refill on your cardiac medications before your next appointment, please call your pharmacy.

## 2017-07-16 ENCOUNTER — Other Ambulatory Visit: Payer: Self-pay | Admitting: Cardiovascular Disease

## 2017-07-16 NOTE — Telephone Encounter (Signed)
Rx request sent to pharmacy.  

## 2017-07-28 ENCOUNTER — Ambulatory Visit: Payer: Self-pay

## 2017-07-28 NOTE — Telephone Encounter (Signed)
Patient called in with c/o "back pain." States "Thursday, I was lifting an object off the shelf at work, turned to place it on the cart and I heard a loud pop in my back and the pain knocked me off my feet. I sat there for a bit, then was able to get up, but I could feel the pain." He reports the pain in the middle of his back radiating side to side, severity is moderate at this time. He says he only took advil for the pain at night, but nothing during the day.  When asked how did the Advil help, he says it "took the edge off, but when I move or cough, the pain is evident that something is wrong." According to the protocol, his is to be seen in the office within 3 days, no availability with his PCP or the practice with the times adequate for the patient, other practices were offered and agreed, no availability for the times he requested, patient states "I will just go to a clinic close to my job and get an xray. I have plans and need an appointment before 1 pm.  I just want to at least get an xray, so I can be told if it's a pulled muscle or something." Care advice given, patient encouraged to call back for an appointment later in the week if the pain continues and if the xray's reveal he needs to be seen, patient verbalized understanding.   Reason for Disposition . [1] MODERATE back pain (e.g., interferes with normal activities) AND [2] present > 3 days  Answer Assessment - Initial Assessment Questions 1. ONSET: "When did the pain begin?"      07/24/17 after lifting from shelf and turning to place on cart 2. LOCATION: "Where does it hurt?" (upper, mid or lower back)     Middle is where the pain is 3. SEVERITY: "How bad is the pain?"  (e.g., Scale 1-10; mild, moderate, or severe)   - MILD (1-3): doesn't interfere with normal activities    - MODERATE (4-7): interferes with normal activities or awakens from sleep    - SEVERE (8-10): excruciating pain, unable to do any normal activities      Moderate 4.  PATTERN: "Is the pain constant?" (e.g., yes, no; constant, intermittent)      Constant 5. RADIATION: "Does the pain shoot into your legs or elsewhere?"     Across my back both sides 6. CAUSE:  "What do you think is causing the back pain?"      Lifting and turning and my back "popped, audible sound of popping" 7. BACK OVERUSE:  "Any recent lifting of heavy objects, strenuous work or exercise?"    Yes-lifting 8. MEDICATIONS: "What have you taken so far for the pain?" (e.g., nothing, acetaminophen, NSAIDS)     Advil at night to help sleep, nothing during the day. 9. NEUROLOGIC SYMPTOMS: "Do you have any weakness, numbness, or problems with bowel/bladder control?"     Denies 10. OTHER SYMPTOMS: "Do you have any other symptoms?" (e.g., fever, abdominal pain, burning with urination, blood in urine)      Mild discomfort to abdomen, feels like a bloating, swollen painful feeling 11. PREGNANCY: "Is there any chance you are pregnant?" (e.g., yes, no; LMP)      N/A  Protocols used: BACK PAIN-A-AH

## 2017-07-30 ENCOUNTER — Emergency Department (HOSPITAL_BASED_OUTPATIENT_CLINIC_OR_DEPARTMENT_OTHER): Payer: BLUE CROSS/BLUE SHIELD

## 2017-07-30 ENCOUNTER — Encounter (HOSPITAL_BASED_OUTPATIENT_CLINIC_OR_DEPARTMENT_OTHER): Payer: Self-pay | Admitting: *Deleted

## 2017-07-30 ENCOUNTER — Emergency Department (HOSPITAL_BASED_OUTPATIENT_CLINIC_OR_DEPARTMENT_OTHER)
Admission: EM | Admit: 2017-07-30 | Discharge: 2017-07-30 | Disposition: A | Discharge status: Home / Self Care | Payer: BLUE CROSS/BLUE SHIELD | Attending: Emergency Medicine | Admitting: Emergency Medicine

## 2017-07-30 ENCOUNTER — Other Ambulatory Visit: Payer: Self-pay

## 2017-07-30 DIAGNOSIS — Y999 Unspecified external cause status: Secondary | ICD-10-CM | POA: Diagnosis not present

## 2017-07-30 DIAGNOSIS — Y9389 Activity, other specified: Secondary | ICD-10-CM | POA: Insufficient documentation

## 2017-07-30 DIAGNOSIS — Z79899 Other long term (current) drug therapy: Secondary | ICD-10-CM | POA: Insufficient documentation

## 2017-07-30 DIAGNOSIS — S3982XA Other specified injuries of lower back, initial encounter: Secondary | ICD-10-CM | POA: Diagnosis not present

## 2017-07-30 DIAGNOSIS — Y929 Unspecified place or not applicable: Secondary | ICD-10-CM | POA: Insufficient documentation

## 2017-07-30 DIAGNOSIS — I10 Essential (primary) hypertension: Secondary | ICD-10-CM | POA: Diagnosis not present

## 2017-07-30 DIAGNOSIS — Z7982 Long term (current) use of aspirin: Secondary | ICD-10-CM | POA: Insufficient documentation

## 2017-07-30 DIAGNOSIS — X500XXA Overexertion from strenuous movement or load, initial encounter: Secondary | ICD-10-CM | POA: Insufficient documentation

## 2017-07-30 DIAGNOSIS — J449 Chronic obstructive pulmonary disease, unspecified: Secondary | ICD-10-CM | POA: Insufficient documentation

## 2017-07-30 DIAGNOSIS — S3992XA Unspecified injury of lower back, initial encounter: Secondary | ICD-10-CM

## 2017-07-30 DIAGNOSIS — F17210 Nicotine dependence, cigarettes, uncomplicated: Secondary | ICD-10-CM | POA: Diagnosis not present

## 2017-07-30 DIAGNOSIS — M549 Dorsalgia, unspecified: Secondary | ICD-10-CM | POA: Diagnosis not present

## 2017-07-30 DIAGNOSIS — Z7902 Long term (current) use of antithrombotics/antiplatelets: Secondary | ICD-10-CM | POA: Insufficient documentation

## 2017-07-30 DIAGNOSIS — I2510 Atherosclerotic heart disease of native coronary artery without angina pectoris: Secondary | ICD-10-CM | POA: Diagnosis not present

## 2017-07-30 MED ORDER — CYCLOBENZAPRINE HCL 10 MG PO TABS: 10.0000 mg | ORAL_TABLET | Freq: Two times a day (BID) | ORAL | 0 refills | Status: DC | PRN

## 2017-07-30 MED ORDER — PREDNISONE 20 MG PO TABS
ORAL_TABLET | ORAL | 0 refills | Status: DC
Start: 2017-07-30 — End: 2017-09-09

## 2017-07-30 MED FILL — predniSONE 20 MG TABS: 20 | 5 days supply | Qty: 11 | Fill #0

## 2017-07-30 MED FILL — CYCLOBENZAPRINE HCL 10 MG T: 10 | 10 days supply | Qty: 20 | Fill #0

## 2017-07-30 NOTE — ED Provider Notes (Signed)
[facility] EMERGENCY DEPARTMENT Provider Note   CSN: [id number] Arrival date & time: 07/30/17  0809     History   Chief Complaint Chief Complaint  Patient presents with  . Back Pain    HPI Sean Rose is a 62 y.o. male.  HPI   62 year old male with history of COPD, CAD, hypertension, tobacco use presenting for evaluation of back pain.  Patient report about 6 days ago he was helping a client lifting a heavy object approximately 70 pounds in weight.  As he lifted upward and twisting he experienced an acute onset of sharp pain to his lower back with a pop sensation.  Pain was intense, dropping down to his knees.  Since then he has had recurrent pain to the same area.  Pain is worsening with trying to get up while rolling out of bed, now radiates towards his right thigh and right lower back.  While resting, pain is minimal.  He has been taking some Advil with minimal relief.  He denies prior back injury.  No complaints of fever or chills, no hematuria, dysuria, bowel bladder incontinence or saddle anesthesia.  No rash.  No history of IV drug use or active cancer.  Past Medical History:  Diagnosis Date  . Anginal pain ([place])   . CAD (coronary artery disease)   . CAP (community acquired pneumonia) 09/2016  . COPD (chronic obstructive pulmonary disease) ([place])   . GERD (gastroesophageal reflux disease)   . Heart murmur    "I was told I had one when I was a kid"  . Hemorrhoids   . History of anal fissures    "no surgeries" (10/30/2016)  . Hyperlipidemia   . Hypertension   . Peripheral arterial disease ([place])    status post right common iliac artery stenting back in 2007  . Seasonal allergies   . Tobacco abuse     Patient Active Problem List   Diagnosis Date Noted  . Nausea vomiting and diarrhea 10/30/2016  . Hyponatremia 10/30/2016  . Hypocalcemia 10/30/2016  . Hypokalemia 04/14/2015  . Hypomagnesemia 04/14/2015  . Claudication ([place]) 04/13/2015  . Essential  hypertension 01/14/2014  . Hyperlipidemia 01/14/2014  . Thrombosed external hemorrhoids 12/07/2013  . Chronic anticoagulation 01/20/2013  . PVD (peripheral vascular disease) ([place]) 01/20/2013  . Coronary atherosclerosis of native coronary artery 01/20/2013  . GERD (gastroesophageal reflux disease) 01/20/2013  . Benign esophageal stricture 01/20/2013  . DENTAL CARIES 07/20/2010    Past Surgical History:  Procedure Laterality Date  . ANKLE SURGERY Left    "rebuilt it"  . ANTERIOR CRUCIATE LIGAMENT REPAIR Right   . CARDIAC CATHETERIZATION  09/23/2008   Continued medical therapy - may need GI evaluation in addition.  Marland Kitchen CARDIAC CATHETERIZATION  10/28/2007   Medical therapy recommended.  Marland Kitchen CARDIAC CATHETERIZATION  11/18/2006   In-stent restenosis RCA  (50% distal edge, 80% segmental mid, and 50-60% segmental proximal). Successful cutting balloon atherectomy using a 325X15 cutting balloon. 3 inflations with atherectomy performed on mid and proximal portions resulting in reduction of 80% mid in-stent restenosis to less than 20% residual and 50-60% segmental proximal to less than 20% residual without dissection.  Marland Kitchen CARDIAC CATHETERIZATION  02/26/2006   Severe stenosis in RCA. Stenting performed using IVUS. 3.5x20 Maverick balloon deployed at [employer]. Distal stent-a 4x28 Liberte stent-deployed 12atm 48sec, 12atm 31sec, 4atm 19sec. Mid stent-a 4x28 Liberte stent-deployed 14atm 45sec, 14atm 60sec, 14atm 44sec. Proximal stent-4x8 Liberte- 14atm 45sec,14atm 47sec, 16atm 43sec. Severely diseased segment then appeared TIMI-3  flow.  Marland Kitchen CARDIOVASCULAR STRESS TEST  11/17/2012   No significant ECG changes. Septal perfusion defect is new when complared to study from 2010. Abnormal myocardial perfusion imaging with a basal to mid perfusion suggestive of previous MI.  Marland Kitchen CAROTID DOPPLER  08/09/2011   Bilateral Bulb/Proximal ICA - demonstrated a mild amount of fibrous plaque without evidence of significant diameter  reduction reduction or other vascular abnormality.  . COLONOSCOPY     2003, 2014  . CORONARY ANGIOPLASTY WITH STENT PLACEMENT    . FEMORAL ARTERY STENT    . INGUINAL HERNIA REPAIR Right   . KNEE ARTHROSCOPY Right "multiple"  . LOWER EXTREMITY ARTERIAL DOPPLER  01/31/2011   Bilateral ABIs-normal values with no suggestion of arterial insuff to the lower extremities at rest. Right CIA stent-mild amount of nonhemodynamically significant plaque is noted throughout  . 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 PERCUTANEOUS STENT INTERVENTION  04/04/2006 & 04/13/2015   a. Right common iliac artery with an 8.0x18 mm Herculink stent deployed at 12 atm. Stenosis was reduced from 80% to 0% with brisk flow. b. I-cast stenting to left common iliac artery  . PERIPHERAL VASCULAR CATHETERIZATION N/A 04/13/2015   Procedure: Lower Extremity Angiography;  Surgeon: Lorretta Harp, MD; L-oCIA 75%, 40-50% L-EIA, R-CIA stent patent, s/p 8 mm x 38 mm ICast covered stent>>0% stenosis in L-oCIA     . SHOULDER ARTHROSCOPY WITH ROTATOR CUFF REPAIR Right   . TRANSTHORACIC ECHOCARDIOGRAM  11/26/2012   EF not noted. Aortic valve-sclerosis without stenosis, no regurgiation.   Marland Kitchen UPPER GASTROINTESTINAL ENDOSCOPY    . US CAROTID DOPPLER BILATERAL ([place] [facility]X)  08/09/2011   Bilateral Bulb/Proximal ICAa demonstrated a mild amount of fibrous plaque without evidence of significant diameter reduction or any other vascular abnormality.       Home Medications    Prior to Admission medications   Medication Sig Start Date End Date Taking? Authorizing Provider  amLODipine (NORVASC) 5 MG tablet Take 1 tablet (5 mg total) by mouth daily. 03/05/17 06/03/17  Barrett, Evelene Croon, PA-C  aspirin EC 81 MG tablet Take 81 mg by mouth daily.    [provider]  clopidogrel (PLAVIX) 75 MG tablet TAKE 1 TABLET BY MOUTH EVERY DAY 07/16/17   Lorretta Harp, MD  diphenhydrAMINE (BENADRYL) 25 mg capsule Take 25 mg by mouth every 6 (six)  hours as needed for allergies.    [provider]  esomeprazole (NEXIUM) 40 MG capsule Take 40 mg by mouth daily.     [provider]  ibuprofen (ADVIL,MOTRIN) 200 MG tablet Take 200 mg by mouth every 6 (six) hours as needed for headache.    [provider]  Magnesium 250 MG TABS Take 1 tablet by mouth daily.    [provider]  metoprolol succinate (TOPROL-XL) 100 MG 24 hr tablet TAKE 1 TABLET BY MOUTH ONCE DAILY 07/16/17   Lorretta Harp, MD  Multiple Vitamin (MULTIVITAMIN WITH MINERALS) TABS tablet Take 1 tablet by mouth daily.    [provider]  nitroGLYCERIN (NITROSTAT) 0.4 MG SL tablet PLACE 1 TABLET UNDER THE TONGUE EVERY 5 MINUTES AS NEEDED FOR CHEST PAIN 03/26/17   Lorretta Harp, MD  ondansetron (ZOFRAN) 4 MG tablet Take 1 tablet (4 mg total) by mouth every 8 (eight) hours as needed for nausea or vomiting. 10/30/16   [name], NP    Family History Family History  Problem Relation Age of Onset  . Colon cancer Mother   .  Heart disease Father   . Heart disease Paternal Grandfather     Social History Social History   Tobacco Use  . Smoking status: Current Every Day Smoker    Packs/day: 1.50    Years: 38.00    Pack years: 57.00    Types: Cigarettes  . Smokeless tobacco: Never Used  Substance Use Topics  . Alcohol use: Yes    Alcohol/week: 21.6 oz    Types: 36 Cans of beer per week    Comment: 10/30/2016 "I usually drink 4-8 beers qd"  . Drug use: No     Allergies   Oxycodone-acetaminophen and Tylox [oxycodone-acetaminophen]   Review of Systems Review of Systems  All other systems reviewed and are negative.    Physical Exam Updated Vital Signs Ht '5\' 8"'  (1.727 m)   Wt 59.4 kg (131 lb)   BMI 19.92 kg/m   Physical Exam  Constitutional: He appears well-developed and well-nourished. No distress.  HENT:  Head: Atraumatic.  Eyes: Conjunctivae are normal.  Neck: Neck supple.  Abdominal: Soft. Bowel sounds are  normal. He exhibits no distension. There is no tenderness.  Musculoskeletal: He exhibits tenderness (Tenderness to L-spine at the level of L2-L3 without any crepitus or step-off.  Tenderness to right paralumbar spinal muscle.  Negative straight leg raise.).  Neurological: He is alert.  4 out of 5 strength to right lower extremity, 5 out of 5 strength left lower extremity.  Intact pedal pulses.  Skin: No rash noted.  Psychiatric: He has a normal mood and affect.  Nursing note and vitals reviewed.    ED Treatments / Results  Labs (all labs ordered are listed, but only abnormal results are displayed) Labs Reviewed - No data to display  EKG  EKG Interpretation None       Radiology No results found.  Procedures Procedures (including critical care time)  Medications Ordered in ED Medications - No data to display   Initial Impression / Assessment and Plan / ED Course  I have reviewed the triage vital signs and the nursing notes.  Pertinent labs & imaging results that were available during my care of the patient were reviewed by me and considered in my medical decision making (see chart for details).     BP (!) 148/93 (BP Location: Right Arm)   Pulse 66   Resp 18   Ht '5\' 8"'  (1.727 m)   Wt 59.4 kg (131 lb)   SpO2 96%   BMI 19.92 kg/m    Final Clinical Impressions(s) / ED Diagnoses   Final diagnoses:  Lower back injury, initial encounter    ED Discharge Orders        Ordered    predniSONE (DELTASONE) 20 MG tablet     07/30/17 1147    cyclobenzaprine (FLEXERIL) 10 MG tablet  2 times daily PRN     07/30/17 1147     10:14 AM Patient injured his lower back from heavy lifting nearly a week ago.  Pain worsening with positional changes.  Pain is suggestive of either a compression fracture of the L-spine or potential herniated disc.  Will obtain imaging for further evaluation.  He is otherwise neurovascular intact.  No red flags.  11:38 AM Lumbar CT without acute  fractures or spinal stenosis.  Incidental enlarged aorta measuring up to 2.6 cm in diameter were noted.  Recommend ultrasound in 5 years.  Suspect patient's pain is likely from a slipped disc.  Encourage patient to take antinausea medication along with steroid,  heat, ice, ibuprofen or Tylenol as needed for pain.  Work note provided.  Referral to neurosurgeon as needed.  Return precautions discussed.   Domenic Moras, PA-C 07/30/17 1255    Virgel Manifold, MD 07/30/17 [phone]

## 2017-07-30 NOTE — ED Triage Notes (Signed)
Pt reports he was lifting heavy wire reels, approx 70#, last Thursday and felt a "pop" in the middle of his back, has had pain since then.

## 2017-08-14 ENCOUNTER — Ambulatory Visit (HOSPITAL_COMMUNITY)
Admission: RE | Admit: 2017-08-14 | Discharge: 2017-08-14 | Disposition: A | Discharge status: Home / Self Care | Payer: BLUE CROSS/BLUE SHIELD | Source: Ambulatory Visit | Attending: Cardiology | Admitting: Cardiology

## 2017-08-14 ENCOUNTER — Inpatient Hospital Stay (HOSPITAL_COMMUNITY): Admission: RE | Admit: 2017-08-14 | Payer: BLUE CROSS/BLUE SHIELD | Source: Ambulatory Visit

## 2017-08-14 DIAGNOSIS — R9389 Abnormal findings on diagnostic imaging of other specified body structures: Secondary | ICD-10-CM | POA: Diagnosis not present

## 2017-08-14 DIAGNOSIS — I739 Peripheral vascular disease, unspecified: Secondary | ICD-10-CM | POA: Diagnosis not present

## 2017-08-15 ENCOUNTER — Other Ambulatory Visit: Payer: Self-pay | Admitting: Cardiovascular Disease

## 2017-08-15 DIAGNOSIS — I739 Peripheral vascular disease, unspecified: Secondary | ICD-10-CM

## 2017-09-09 ENCOUNTER — Ambulatory Visit (INDEPENDENT_AMBULATORY_CARE_PROVIDER_SITE_OTHER): Payer: BLUE CROSS/BLUE SHIELD | Admitting: Adult Health

## 2017-09-09 ENCOUNTER — Encounter: Payer: Self-pay | Admitting: Adult Health

## 2017-09-09 VITALS — BP 138/80 | Temp 98.1°F | Wt 133.0 lb

## 2017-09-09 DIAGNOSIS — J069 Acute upper respiratory infection, unspecified: Secondary | ICD-10-CM | POA: Diagnosis not present

## 2017-09-09 DIAGNOSIS — R0789 Other chest pain: Secondary | ICD-10-CM

## 2017-09-09 MED ORDER — AZITHROMYCIN 250 MG PO TABS: ORAL_TABLET | ORAL | 0 refills | Status: DC

## 2017-09-09 MED ORDER — PREDNISONE 10 MG PO TABS: ORAL_TABLET | ORAL | 0 refills | Status: DC

## 2017-09-09 NOTE — Progress Notes (Signed)
Subjective:    Patient ID: Sean Rose, male    DOB: [birth year], 62 y.o.   MRN: [id number]  URI   This is a new problem. The current episode started in the past 7 days (4 days ). There has been no fever. Associated symptoms include congestion, coughing, rhinorrhea, sinus pain and wheezing. Pertinent negatives include no diarrhea, nausea or vomiting.      Review of Systems  Constitutional: Positive for activity change, chills, diaphoresis, fatigue and fever (subjective ).  HENT: Positive for congestion, postnasal drip, rhinorrhea, sinus pressure and sinus pain.   Respiratory: Positive for cough, chest tightness, shortness of breath (with exertion ) and wheezing.   Cardiovascular: Negative.   Gastrointestinal: Negative for diarrhea, nausea and vomiting.   Past Medical History:  Diagnosis Date  . Anginal pain ([place])   . CAD (coronary artery disease)   . CAP (community acquired pneumonia) 09/2016  . COPD (chronic obstructive pulmonary disease) ([place])   . GERD (gastroesophageal reflux disease)   . Heart murmur    "I was told I had one when I was a kid"  . Hemorrhoids   . History of anal fissures    "no surgeries" (10/30/2016)  . Hyperlipidemia   . Hypertension   . Peripheral arterial disease ([place])    status post right common iliac artery stenting back in 2007  . Seasonal allergies   . Tobacco abuse     Social History   Socioeconomic History  . Marital status: Married    Spouse name: Not on file  . Number of children: Not on file  . Years of education: Not on file  . Highest education level: Not on file  Social Needs  . Financial resource strain: Not on file  . Food insecurity - worry: Not on file  . Food insecurity - inability: Not on file  . Transportation needs - medical: Not on file  . Transportation needs - non-medical: Not on file  Occupational History  . Not on file  Tobacco Use  . Smoking status: Current Every Day Smoker    Packs/day: 1.50    Years: 38.00    Pack years: 57.00    Types: Cigarettes  . Smokeless tobacco: Never Used  Substance and Sexual Activity  . Alcohol use: Yes    Alcohol/week: 21.6 oz    Types: 36 Cans of beer per week    Comment: 10/30/2016 "I usually drink 4-8 beers qd"  . Drug use: No  . Sexual activity: Yes  Other Topics Concern  . Not on file  Social History Narrative  . Not on file    Past Surgical History:  Procedure Laterality Date  . ANKLE SURGERY Left    "rebuilt it"  . ANTERIOR CRUCIATE LIGAMENT REPAIR Right   . CARDIAC CATHETERIZATION  09/23/2008   Continued medical therapy - may need GI evaluation in addition.  Marland Kitchen CARDIAC CATHETERIZATION  10/28/2007   Medical therapy recommended.  Marland Kitchen CARDIAC CATHETERIZATION  11/18/2006   In-stent restenosis RCA  (50% distal edge, 80% segmental mid, and 50-60% segmental proximal). Successful cutting balloon atherectomy using a 325X15 cutting balloon. 3 inflations with atherectomy performed on mid and proximal portions resulting in reduction of 80% mid in-stent restenosis to less than 20% residual and 50-60% segmental proximal to less than 20% residual without dissection.  Marland Kitchen CARDIAC CATHETERIZATION  02/26/2006   Severe stenosis in RCA. Stenting performed using IVUS. 3.5x20 Maverick balloon deployed at [facility]. Distal stent-a 4x28 Liberte stent-deployed 12atm 48sec,  12atm 31sec, 4atm 19sec. Mid stent-a 4x28 Liberte stent-deployed 14atm 45sec, 14atm 60sec, 14atm 44sec. Proximal stent-4x8 Liberte- 14atm 45sec,14atm 47sec, 16atm 43sec. Severely diseased segment then appeared TIMI-3 flow.  Marland Kitchen CARDIOVASCULAR STRESS TEST  11/17/2012   No significant ECG changes. Septal perfusion defect is new when complared to study from 2010. Abnormal myocardial perfusion imaging with a basal to mid perfusion suggestive of previous MI.  Marland Kitchen CAROTID DOPPLER  08/09/2011   Bilateral Bulb/Proximal ICA - demonstrated a mild amount of fibrous plaque without evidence of significant diameter reduction reduction or  other vascular abnormality.  . COLONOSCOPY     2003, 2014  . CORONARY ANGIOPLASTY WITH STENT PLACEMENT    . FEMORAL ARTERY STENT    . INGUINAL HERNIA REPAIR Right   . KNEE ARTHROSCOPY Right "multiple"  . LOWER EXTREMITY ARTERIAL DOPPLER  01/31/2011   Bilateral ABIs-normal values with no suggestion of arterial insuff to the lower extremities at rest. Right CIA stent-mild amount of nonhemodynamically significant plaque is noted throughout  . 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 PERCUTANEOUS STENT INTERVENTION  04/04/2006 & 04/13/2015   a. Right common iliac artery with an 8.0x18 mm Herculink stent deployed at 12 atm. Stenosis was reduced from 80% to 0% with brisk flow. b. I-cast stenting to left common iliac artery  . PERIPHERAL VASCULAR CATHETERIZATION N/A 04/13/2015   Procedure: Lower Extremity Angiography;  Surgeon: Lorretta Harp, MD; L-oCIA 75%, 40-50% L-EIA, R-CIA stent patent, s/p 8 mm x 38 mm ICast covered stent>>0% stenosis in L-oCIA     . SHOULDER ARTHROSCOPY WITH ROTATOR CUFF REPAIR Right   . TRANSTHORACIC ECHOCARDIOGRAM  11/26/2012   EF not noted. Aortic valve-sclerosis without stenosis, no regurgiation.   Marland Kitchen UPPER GASTROINTESTINAL ENDOSCOPY    . US CAROTID DOPPLER BILATERAL ([place] HX)  08/09/2011   Bilateral Bulb/Proximal ICAa demonstrated a mild amount of fibrous plaque without evidence of significant diameter reduction or any other vascular abnormality.    Family History  Problem Relation Age of Onset  . Colon cancer Mother   . Heart disease Father   . Heart disease Paternal Grandfather     Allergies  Allergen Reactions  . Oxycodone-Acetaminophen Nausea Only  . Tylox [Oxycodone-Acetaminophen] Nausea Only    Current Outpatient Medications on File Prior to Visit  Medication Sig Dispense Refill  . aspirin EC 81 MG tablet Take 81 mg by mouth daily.    . clopidogrel (PLAVIX) 75 MG tablet TAKE 1 TABLET BY MOUTH EVERY DAY 30 tablet 6  . diphenhydrAMINE (BENADRYL) 25  mg capsule Take 25 mg by mouth every 6 (six) hours as needed for allergies.    Marland Kitchen esomeprazole (NEXIUM) 40 MG capsule Take 40 mg by mouth daily.     Marland Kitchen ibuprofen (ADVIL,MOTRIN) 200 MG tablet Take 200 mg by mouth every 6 (six) hours as needed for headache.    . Magnesium 250 MG TABS Take 1 tablet by mouth daily.    . metoprolol succinate (TOPROL-XL) 100 MG 24 hr tablet TAKE 1 TABLET BY MOUTH ONCE DAILY 30 tablet 11  . Multiple Vitamin (MULTIVITAMIN WITH MINERALS) TABS tablet Take 1 tablet by mouth daily.    . nitroGLYCERIN (NITROSTAT) 0.4 MG SL tablet PLACE 1 TABLET UNDER THE TONGUE EVERY 5 MINUTES AS NEEDED FOR CHEST PAIN 25 tablet 0  . amLODipine (NORVASC) 5 MG tablet Take 1 tablet (5 mg total) by mouth daily. 90 tablet 1   No current facility-administered medications on file prior to visit.  BP 138/80 (BP Location: Left Arm)   Temp 98.1 F (36.7 C) (Oral)   Wt 133 lb (60.3 kg)   BMI 20.22 kg/m       Objective:   Physical Exam  Constitutional: He appears well-developed and well-nourished. No distress.  HENT:  Head: Normocephalic and atraumatic.  Left Ear: External ear normal.  Nose: No mucosal edema or rhinorrhea. Right sinus exhibits maxillary sinus tenderness. Right sinus exhibits no frontal sinus tenderness. Left sinus exhibits maxillary sinus tenderness. Left sinus exhibits no frontal sinus tenderness.  Mouth/Throat: Uvula is midline, oropharynx is clear and moist and mucous membranes are normal. No oropharyngeal exudate, posterior oropharyngeal edema, posterior oropharyngeal erythema or tonsillar abscesses.  Cardiovascular: Normal rate, regular rhythm, normal heart sounds and intact distal pulses. Exam reveals no gallop and no friction rub.  No murmur heard. Pulmonary/Chest: Effort normal. No respiratory distress. He has decreased breath sounds. He has no wheezes. He has no rhonchi. He has no rales. He exhibits no tenderness.  Skin: He is not diaphoretic.  Nursing note and  vitals reviewed.     Assessment & Plan:  1. Upper respiratory tract infection, unspecified type - Will treat due to symptoms and history of COPD as well as continues to smoke.  - EKG 12-Lead- Sinus  Rhythm  -Short PR syndrome  PRi = 108 -RSR(V1) -nondiagnostic.   -Nonspecific ST depression  -Nondiagnostic. Rate 61 - consistent with previous EKG's.  - azithromycin (ZITHROMAX Z-PAK) 250 MG tablet; Take 2 tablets on Day 1.  Then take 1 tablet daily.  Dispense: 6 tablet; Refill: 0 - predniSONE (DELTASONE) 10 MG tablet; 40 mg x 3 days, 20 mg x 3 days, 10 mg x 3 days  Dispense: 21 tablet; Refill: 0  2. Chest tightness -Likely COPD related. We talked about daily inhalers but he continues to refuse.  - EKG 12-Lead   [employer]

## 2017-09-09 NOTE — Progress Notes (Signed)
Subjective:    Patient ID: Sean Rose, male    DOB: [birth year], 62 y.o.   MRN: [id number]  HPI  4 day history of rhinorrhea, sneezing, non-productive cough, fatigue and myalgia.  Had a large nosebleed yesterday.  Nasal congestion with some blood tinged mucus.  No fever, feels flush and having chills.  Having some shortness of breath with extended speaking.  Chest heaviness like an "anvil" on his chest.  He denies any radiation.  Pain is worse at times with a coughing. Positive for sinus pressure without pain. He has not tried any OTC products.   Review of Systems  Constitutional: Positive for chills, fatigue and fever.  HENT: Positive for congestion, postnasal drip, rhinorrhea, sinus pressure, sneezing and sore throat. Negative for ear discharge, ear pain and sinus pain.   Respiratory: Positive for cough, chest tightness, shortness of breath and wheezing.   Cardiovascular: Positive for chest pain. Negative for palpitations and leg swelling.  Neurological: Positive for headaches.   Past Medical History:  Diagnosis Date  . Anginal pain ([place])   . CAD (coronary artery disease)   . CAP (community acquired pneumonia) 09/2016  . COPD (chronic obstructive pulmonary disease) ([place])   . GERD (gastroesophageal reflux disease)   . Heart murmur    "I was told I had one when I was a kid"  . Hemorrhoids   . History of anal fissures    "no surgeries" (10/30/2016)  . Hyperlipidemia   . Hypertension   . Peripheral arterial disease ([place])    status post right common iliac artery stenting back in 2007  . Seasonal allergies   . Tobacco abuse     Social History   Socioeconomic History  . Marital status: Married    Spouse name: Not on file  . Number of children: Not on file  . Years of education: Not on file  . Highest education level: Not on file  Social Needs  . Financial resource strain: Not on file  . Food insecurity - worry: Not on file  . Food insecurity - inability: Not on file  .  Transportation needs - medical: Not on file  . Transportation needs - non-medical: Not on file  Occupational History  . Not on file  Tobacco Use  . Smoking status: Current Every Day Smoker    Packs/day: 1.50    Years: 38.00    Pack years: 57.00    Types: Cigarettes  . Smokeless tobacco: Never Used  Substance and Sexual Activity  . Alcohol use: Yes    Alcohol/week: 21.6 oz    Types: 36 Cans of beer per week    Comment: 10/30/2016 "I usually drink 4-8 beers qd"  . Drug use: No  . Sexual activity: Yes  Other Topics Concern  . Not on file  Social History Narrative  . Not on file    Past Surgical History:  Procedure Laterality Date  . ANKLE SURGERY Left    "rebuilt it"  . ANTERIOR CRUCIATE LIGAMENT REPAIR Right   . CARDIAC CATHETERIZATION  09/23/2008   Continued medical therapy - may need GI evaluation in addition.  Marland Kitchen CARDIAC CATHETERIZATION  10/28/2007   Medical therapy recommended.  Marland Kitchen CARDIAC CATHETERIZATION  11/18/2006   In-stent restenosis RCA  (50% distal edge, 80% segmental mid, and 50-60% segmental proximal). Successful cutting balloon atherectomy using a 325X15 cutting balloon. 3 inflations with atherectomy performed on mid and proximal portions resulting in reduction of 80% mid in-stent restenosis to less than 20%  residual and 50-60% segmental proximal to less than 20% residual without dissection.  Marland Kitchen CARDIAC CATHETERIZATION  02/26/2006   Severe stenosis in RCA. Stenting performed using IVUS. 3.5x20 Maverick balloon deployed at [employer]. Distal stent-a 4x28 Liberte stent-deployed 12atm 48sec, 12atm 31sec, 4atm 19sec. Mid stent-a 4x28 Liberte stent-deployed 14atm 45sec, 14atm 60sec, 14atm 44sec. Proximal stent-4x8 Liberte- 14atm 45sec,14atm 47sec, 16atm 43sec. Severely diseased segment then appeared TIMI-3 flow.  Marland Kitchen CARDIOVASCULAR STRESS TEST  11/17/2012   No significant ECG changes. Septal perfusion defect is new when complared to study from 2010. Abnormal myocardial perfusion  imaging with a basal to mid perfusion suggestive of previous MI.  Marland Kitchen CAROTID DOPPLER  08/09/2011   Bilateral Bulb/Proximal ICA - demonstrated a mild amount of fibrous plaque without evidence of significant diameter reduction reduction or other vascular abnormality.  . COLONOSCOPY     2003, 2014  . CORONARY ANGIOPLASTY WITH STENT PLACEMENT    . FEMORAL ARTERY STENT    . INGUINAL HERNIA REPAIR Right   . KNEE ARTHROSCOPY Right "multiple"  . LOWER EXTREMITY ARTERIAL DOPPLER  01/31/2011   Bilateral ABIs-normal values with no suggestion of arterial insuff to the lower extremities at rest. Right CIA stent-mild amount of nonhemodynamically significant plaque is noted throughout  . 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 PERCUTANEOUS STENT INTERVENTION  04/04/2006 & 04/13/2015   a. Right common iliac artery with an 8.0x18 mm Herculink stent deployed at 12 atm. Stenosis was reduced from 80% to 0% with brisk flow. b. I-cast stenting to left common iliac artery  . PERIPHERAL VASCULAR CATHETERIZATION N/A 04/13/2015   Procedure: Lower Extremity Angiography;  Surgeon: Lorretta Harp, MD; L-oCIA 75%, 40-50% L-EIA, R-CIA stent patent, s/p 8 mm x 38 mm ICast covered stent>>0% stenosis in L-oCIA     . SHOULDER ARTHROSCOPY WITH ROTATOR CUFF REPAIR Right   . TRANSTHORACIC ECHOCARDIOGRAM  11/26/2012   EF not noted. Aortic valve-sclerosis without stenosis, no regurgiation.   Marland Kitchen UPPER GASTROINTESTINAL ENDOSCOPY    . US CAROTID DOPPLER BILATERAL ([place] [facility])  08/09/2011   Bilateral Bulb/Proximal ICAa demonstrated a mild amount of fibrous plaque without evidence of significant diameter reduction or any other vascular abnormality.    Family History  Problem Relation Age of Onset  . Colon cancer Mother   . Heart disease Father   . Heart disease Paternal Grandfather     Allergies  Allergen Reactions  . Oxycodone-Acetaminophen Nausea Only  . Tylox [Oxycodone-Acetaminophen] Nausea Only    Current Outpatient  Medications on File Prior to Visit  Medication Sig Dispense Refill  . aspirin EC 81 MG tablet Take 81 mg by mouth daily.    . clopidogrel (PLAVIX) 75 MG tablet TAKE 1 TABLET BY MOUTH EVERY DAY 30 tablet 6  . diphenhydrAMINE (BENADRYL) 25 mg capsule Take 25 mg by mouth every 6 (six) hours as needed for allergies.    Marland Kitchen esomeprazole (NEXIUM) 40 MG capsule Take 40 mg by mouth daily.     Marland Kitchen ibuprofen (ADVIL,MOTRIN) 200 MG tablet Take 200 mg by mouth every 6 (six) hours as needed for headache.    . Magnesium 250 MG TABS Take 1 tablet by mouth daily.    . metoprolol succinate (TOPROL-XL) 100 MG 24 hr tablet TAKE 1 TABLET BY MOUTH ONCE DAILY 30 tablet 11  . Multiple Vitamin (MULTIVITAMIN WITH MINERALS) TABS tablet Take 1 tablet by mouth daily.    . nitroGLYCERIN (NITROSTAT) 0.4 MG SL tablet PLACE 1 TABLET UNDER THE TONGUE  EVERY 5 MINUTES AS NEEDED FOR CHEST PAIN 25 tablet 0  . amLODipine (NORVASC) 5 MG tablet Take 1 tablet (5 mg total) by mouth daily. 90 tablet 1   No current facility-administered medications on file prior to visit.     BP 138/80 (BP Location: Left Arm)   Temp 98.1 F (36.7 C) (Oral)   Wt 133 lb (60.3 kg)   BMI 20.22 kg/m      Objective:   Physical Exam  Constitutional: He appears well-developed and well-nourished. No distress.  HENT:  Left Ear: Tympanic membrane normal.  Nose: Mucosal edema and rhinorrhea present. Right sinus exhibits no maxillary sinus tenderness and no frontal sinus tenderness. Left sinus exhibits no maxillary sinus tenderness and no frontal sinus tenderness.  Mouth/Throat: Mucous membranes are normal. Posterior oropharyngeal erythema present. No oropharyngeal exudate or posterior oropharyngeal edema.  Unable to visualize right TM due to cerumen impaction. Inferior turbinates are erythematous.  Neck: No JVD present.  Cardiovascular: Normal rate, regular rhythm and normal heart sounds.  Pulmonary/Chest: No respiratory distress. He has no wheezes. He has no  rales.  Lymphadenopathy:    He has no cervical adenopathy.  Skin: Skin is warm. He is not diaphoretic.      Assessment & Plan:

## 2017-09-11 ENCOUNTER — Other Ambulatory Visit: Payer: Self-pay | Admitting: Cardiovascular Disease

## 2017-09-15 ENCOUNTER — Telehealth: Payer: Self-pay

## 2017-09-15 NOTE — Telephone Encounter (Signed)
Per chart pt not seen in [facility] or [place] facility.  LMTCB if needs OV to assess.     [place] Patient Name: Sean Rose Gender: Male DOB: 09/24/1955 Age: 62 Y 71 M 20 D Return Phone Number: [phone] (Primary) Address: City/State/Zip: [place] [address] Client [facility] - Client Client Site [facility] - Night Physician [name] - NP Contact Type Call Who Is Calling Patient / Member / Family / Caregiver Call Type Triage / Clinical Relationship To Patient Self Return Phone Number [phone] (Primary) Chief Complaint CHEST PAIN (>=21 years) - pain, pressure, heaviness or tightness Reason for Call Symptomatic / Request for [place] he was seen in the office on Tuesday and was started on a z-pak and prednisone. He seems like he has gotten worse since then and wants to know what he can do to get over it. He has a bad cough, nasal drainage, does not know if he has a fever, and just feels sick. Also complains of some heaviness on his chest. Nurse Assessment Nurse: Joline Salt, RN, Malachy Mood Date/Time (Eastern Time): 09/13/2017 9:55:26 PM Confirm and document reason for call. If symptomatic, describe symptoms. ---aller States he was seen in the office on Tuesday and was started on a z-pak and prednisone. He seems like he has gotten worse since then and wants to know what he can do to get over it. He has a bad cough, nasal drainage, does not know if he has a fever, and just feels sick. Also complains of some heaviness on his chest. He does have a heart history. List chronic conditions. ---CAD, COPD, stents in both legs and in heart Referrals [facility]

## 2017-11-27 DIAGNOSIS — M25561 Pain in right knee: Secondary | ICD-10-CM | POA: Diagnosis not present

## 2017-12-11 ENCOUNTER — Ambulatory Visit (INDEPENDENT_AMBULATORY_CARE_PROVIDER_SITE_OTHER): Payer: BLUE CROSS/BLUE SHIELD | Admitting: Adult Health

## 2017-12-11 ENCOUNTER — Encounter: Payer: Self-pay | Admitting: Adult Health

## 2017-12-11 VITALS — BP 150/80 | Temp 98.3°F | Wt 128.0 lb

## 2017-12-11 DIAGNOSIS — J302 Other seasonal allergic rhinitis: Secondary | ICD-10-CM | POA: Diagnosis not present

## 2017-12-11 MED ORDER — PREDNISONE 10 MG PO TABS: ORAL_TABLET | ORAL | 0 refills | Status: DC

## 2017-12-11 NOTE — Progress Notes (Signed)
Subjective:    Patient ID: Sean Rose, male    DOB: 07/08/1956, 62 y.o.   MRN: [id number]  URI   This is a new problem. The current episode started in the past 7 days. The problem has been unchanged. There has been no fever. Associated symptoms include congestion, coughing (semi productive ), headaches and rhinorrhea. Pertinent negatives include no diarrhea, ear pain, nausea, neck pain, plugged ear sensation, sinus pain, sore throat, vomiting or wheezing. He has tried nothing for the symptoms. The treatment provided no relief.    Review of Systems  Constitutional: Negative.  Negative for appetite change, chills, fatigue and fever.  HENT: Positive for congestion, rhinorrhea and sinus pressure. Negative for ear pain, sinus pain, sore throat and trouble swallowing.   Respiratory: Positive for cough (semi productive ). Negative for chest tightness, shortness of breath and wheezing.   Cardiovascular: Negative.   Gastrointestinal: Negative for diarrhea, nausea and vomiting.  Musculoskeletal: Negative for neck pain.  Skin: Negative.   Neurological: Positive for headaches.  All other systems reviewed and are negative.  Past Medical History:  Diagnosis Date  . Anginal pain ([place])   . CAD (coronary artery disease)   . CAP (community acquired pneumonia) 09/2016  . COPD (chronic obstructive pulmonary disease) ([place])   . GERD (gastroesophageal reflux disease)   . Heart murmur    "I was told I had one when I was a kid"  . Hemorrhoids   . History of anal fissures    "no surgeries" (10/30/2016)  . Hyperlipidemia   . Hypertension   . Peripheral arterial disease ()    status post right common iliac artery stenting back in 2007  . Seasonal allergies   . Tobacco abuse     Social History   Socioeconomic History  . Marital status: Married    Spouse name: Not on file  . Number of children: Not on file  . Years of education: Not on file  . Highest education level: Not on file    Occupational History  . Not on file  Social Needs  . Financial resource strain: Not on file  . Food insecurity:    Worry: Not on file    Inability: Not on file  . Transportation needs:    Medical: Not on file    Non-medical: Not on file  Tobacco Use  . Smoking status: Current Every Day Smoker    Packs/day: 1.50    Years: 38.00    Pack years: 57.00    Types: Cigarettes  . Smokeless tobacco: Never Used  Substance and Sexual Activity  . Alcohol use: Yes    Alcohol/week: 21.6 oz    Types: 36 Cans of beer per week    Comment: 10/30/2016 "I usually drink 4-8 beers qd"  . Drug use: No  . Sexual activity: Yes  Lifestyle  . Physical activity:    Days per week: Not on file    Minutes per session: Not on file  . Stress: Not on file  Relationships  . Social connections:    Talks on phone: Not on file    Gets together: Not on file    Attends religious service: Not on file    Active member of club or organization: Not on file    Attends meetings of clubs or organizations: Not on file    Relationship status: Not on file  . Intimate partner violence:    Fear of current or ex partner: Not on file  Emotionally abused: Not on file    Physically abused: Not on file    Forced sexual activity: Not on file  Other Topics Concern  . Not on file  Social History Narrative  . Not on file    Past Surgical History:  Procedure Laterality Date  . ANKLE SURGERY Left    "rebuilt it"  . ANTERIOR CRUCIATE LIGAMENT REPAIR Right   . CARDIAC CATHETERIZATION  09/23/2008   Continued medical therapy - may need GI evaluation in addition.  Marland Kitchen CARDIAC CATHETERIZATION  10/28/2007   Medical therapy recommended.  Marland Kitchen CARDIAC CATHETERIZATION  11/18/2006   In-stent restenosis RCA  (50% distal edge, 80% segmental mid, and 50-60% segmental proximal). Successful cutting balloon atherectomy using a 325X15 cutting balloon. 3 inflations with atherectomy performed on mid and proximal portions resulting in reduction of  80% mid in-stent restenosis to less than 20% residual and 50-60% segmental proximal to less than 20% residual without dissection.  Marland Kitchen CARDIAC CATHETERIZATION  02/26/2006   Severe stenosis in RCA. Stenting performed using IVUS. 3.5x20 Maverick balloon deployed at [employer]. Distal stent-a 4x28 Liberte stent-deployed 12atm 48sec, 12atm 31sec, 4atm 19sec. Mid stent-a 4x28 Liberte stent-deployed 14atm 45sec, 14atm 60sec, 14atm 44sec. Proximal stent-4x8 Liberte- 14atm 45sec,14atm 47sec, 16atm 43sec. Severely diseased segment then appeared TIMI-3 flow.  Marland Kitchen CARDIOVASCULAR STRESS TEST  11/17/2012   No significant ECG changes. Septal perfusion defect is new when complared to study from 2010. Abnormal myocardial perfusion imaging with a basal to mid perfusion suggestive of previous MI.  Marland Kitchen CAROTID DOPPLER  08/09/2011   Bilateral Bulb/Proximal ICA - demonstrated a mild amount of fibrous plaque without evidence of significant diameter reduction reduction or other vascular abnormality.  . COLONOSCOPY     2003, 2014  . CORONARY ANGIOPLASTY WITH STENT PLACEMENT    . FEMORAL ARTERY STENT    . INGUINAL HERNIA REPAIR Right   . KNEE ARTHROSCOPY Right "multiple"  . LOWER EXTREMITY ARTERIAL DOPPLER  01/31/2011   Bilateral ABIs-normal values with no suggestion of arterial insuff to the lower extremities at rest. Right CIA stent-mild amount of nonhemodynamically significant plaque is noted throughout  . 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 PERCUTANEOUS STENT INTERVENTION  04/04/2006 & 04/13/2015   a. Right common iliac artery with an 8.0x18 mm Herculink stent deployed at 12 atm. Stenosis was reduced from 80% to 0% with brisk flow. b. I-cast stenting to left common iliac artery  . PERIPHERAL VASCULAR CATHETERIZATION N/A 04/13/2015   Procedure: Lower Extremity Angiography;  Surgeon: Lorretta Harp, MD; L-oCIA 75%, 40-50% L-EIA, R-CIA stent patent, s/p 8 mm x 38 mm ICast covered stent>>0% stenosis in L-oCIA     .  SHOULDER ARTHROSCOPY WITH ROTATOR CUFF REPAIR Right   . TRANSTHORACIC ECHOCARDIOGRAM  11/26/2012   EF not noted. Aortic valve-sclerosis without stenosis, no regurgiation.   Marland Kitchen UPPER GASTROINTESTINAL ENDOSCOPY    . US CAROTID DOPPLER BILATERAL ([place] HX)  08/09/2011   Bilateral Bulb/Proximal ICAa demonstrated a mild amount of fibrous plaque without evidence of significant diameter reduction or any other vascular abnormality.    Family History  Problem Relation Age of Onset  . Colon cancer Mother   . Heart disease Father   . Heart disease Paternal Grandfather     Allergies  Allergen Reactions  . Oxycodone-Acetaminophen Nausea Only  . Tylox [Oxycodone-Acetaminophen] Nausea Only    Current Outpatient Medications on File Prior to Visit  Medication Sig Dispense Refill  . aspirin EC 81 MG tablet  Take 81 mg by mouth daily.    . clopidogrel (PLAVIX) 75 MG tablet TAKE 1 TABLET BY MOUTH EVERY DAY 30 tablet 6  . diphenhydrAMINE (BENADRYL) 25 mg capsule Take 25 mg by mouth every 6 (six) hours as needed for allergies.    Marland Kitchen esomeprazole (NEXIUM) 40 MG capsule Take 40 mg by mouth daily.     Marland Kitchen ibuprofen (ADVIL,MOTRIN) 200 MG tablet Take 200 mg by mouth every 6 (six) hours as needed for headache.    . Magnesium 250 MG TABS Take 1 tablet by mouth daily.    . metoprolol succinate (TOPROL-XL) 100 MG 24 hr tablet TAKE 1 TABLET BY MOUTH ONCE DAILY 30 tablet 11  . Multiple Vitamin (MULTIVITAMIN WITH MINERALS) TABS tablet Take 1 tablet by mouth daily.    . nitroGLYCERIN (NITROSTAT) 0.4 MG SL tablet PLACE 1 TABLET UNDER THE TONGUE EVERY 5 MINUTES AS NEEDED FOR CHEST PAIN 25 tablet 6  . amLODipine (NORVASC) 5 MG tablet Take 1 tablet (5 mg total) by mouth daily. 90 tablet 1   No current facility-administered medications on file prior to visit.     BP (!) 150/80   Temp 98.3 F (36.8 C)   Wt 128 lb (58.1 kg)   BMI 19.46 kg/m       Objective:   Physical Exam  Constitutional: He appears well-developed and  well-nourished. No distress.  HENT:  Right Ear: Hearing normal. Tympanic membrane is not erythematous and not bulging.  Left Ear: Hearing normal. Tympanic membrane is not erythematous and not bulging.  Nose: Rhinorrhea (clear) present. Right sinus exhibits no maxillary sinus tenderness and no frontal sinus tenderness. Left sinus exhibits no maxillary sinus tenderness and no frontal sinus tenderness.  Mouth/Throat: Uvula is midline, oropharynx is clear and moist and mucous membranes are normal.  Neck: Normal range of motion. Neck supple.  Cardiovascular: Normal rate, regular rhythm, normal heart sounds and intact distal pulses. Exam reveals no gallop and no friction rub.  No murmur heard. Pulmonary/Chest: Effort normal and breath sounds normal. No stridor. No respiratory distress. He has no wheezes. He has no rales. He exhibits no tenderness.  Skin: He is not diaphoretic.  Nursing note and vitals reviewed.     Assessment & Plan:  1. Seasonal allergies - No signs of bacterial infection  - Advised flonase and/or claritin  - Will send in prednisone due to history of COPD  - Follow up in 2-3 days if no improvement   [name], NP

## 2018-01-05 ENCOUNTER — Encounter: Payer: Self-pay | Admitting: Family Medicine

## 2018-01-05 ENCOUNTER — Ambulatory Visit (INDEPENDENT_AMBULATORY_CARE_PROVIDER_SITE_OTHER): Payer: BLUE CROSS/BLUE SHIELD | Admitting: Family Medicine

## 2018-01-05 VITALS — BP 116/68 | HR 64 | Temp 98.4°F | Ht 68.0 in | Wt 125.6 lb

## 2018-01-05 DIAGNOSIS — J019 Acute sinusitis, unspecified: Secondary | ICD-10-CM | POA: Diagnosis not present

## 2018-01-05 MED ORDER — AMOXICILLIN-POT CLAVULANATE 875-125 MG PO TABS: 1.0000 | ORAL_TABLET | Freq: Two times a day (BID) | ORAL | 0 refills | Status: DC

## 2018-01-05 NOTE — Progress Notes (Signed)
   Subjective:    Patient ID: Sean Rose, male    DOB: 1956-02-29, 62 y.o.   MRN: [id number]  HPI Here for sinus pressure, PND, and coughing up yellow sputum. He saw [facility] a month ago and they felt he was having allergy symptoms. He took a taper of Prednisone but never got better. Then he developed a fever to 100 degrees over this past weekend.    Review of Systems  Constitutional: Positive for fever.  HENT: Positive for congestion, postnasal drip, sinus pressure and sinus pain. Negative for sore throat.   Eyes: Negative.   Respiratory: Positive for cough.        Objective:   Physical Exam  Constitutional: He appears well-developed and well-nourished.  HENT:  Right Ear: External ear normal.  Left Ear: External ear normal.  Nose: Nose normal.  Mouth/Throat: Oropharynx is clear and moist.  Eyes: Conjunctivae are normal.  Neck: No thyromegaly present.  Pulmonary/Chest: Effort normal and breath sounds normal. No stridor. No respiratory distress. He has no wheezes. He has no rales.  Lymphadenopathy:    He has no cervical adenopathy.          Assessment & Plan:  Sinusitis, treat with Augmentin. Alysia Penna, MD

## 2018-01-07 ENCOUNTER — Other Ambulatory Visit: Payer: Self-pay | Admitting: Physician Assistant

## 2018-02-12 DIAGNOSIS — M9900 Segmental and somatic dysfunction of head region: Secondary | ICD-10-CM | POA: Diagnosis not present

## 2018-02-12 DIAGNOSIS — R51 Headache: Secondary | ICD-10-CM | POA: Diagnosis not present

## 2018-02-12 DIAGNOSIS — M542 Cervicalgia: Secondary | ICD-10-CM | POA: Diagnosis not present

## 2018-02-12 DIAGNOSIS — M9901 Segmental and somatic dysfunction of cervical region: Secondary | ICD-10-CM | POA: Diagnosis not present

## 2018-02-13 DIAGNOSIS — M5031 Other cervical disc degeneration,  high cervical region: Secondary | ICD-10-CM | POA: Diagnosis not present

## 2018-02-13 DIAGNOSIS — M4312 Spondylolisthesis, cervical region: Secondary | ICD-10-CM | POA: Diagnosis not present

## 2018-02-16 DIAGNOSIS — M542 Cervicalgia: Secondary | ICD-10-CM | POA: Diagnosis not present

## 2018-02-16 DIAGNOSIS — M9900 Segmental and somatic dysfunction of head region: Secondary | ICD-10-CM | POA: Diagnosis not present

## 2018-02-16 DIAGNOSIS — R51 Headache: Secondary | ICD-10-CM | POA: Diagnosis not present

## 2018-02-16 DIAGNOSIS — M9901 Segmental and somatic dysfunction of cervical region: Secondary | ICD-10-CM | POA: Diagnosis not present

## 2018-02-18 DIAGNOSIS — R51 Headache: Secondary | ICD-10-CM | POA: Diagnosis not present

## 2018-02-18 DIAGNOSIS — M9900 Segmental and somatic dysfunction of head region: Secondary | ICD-10-CM | POA: Diagnosis not present

## 2018-02-18 DIAGNOSIS — M542 Cervicalgia: Secondary | ICD-10-CM | POA: Diagnosis not present

## 2018-02-18 DIAGNOSIS — M9901 Segmental and somatic dysfunction of cervical region: Secondary | ICD-10-CM | POA: Diagnosis not present

## 2018-02-19 DIAGNOSIS — M542 Cervicalgia: Secondary | ICD-10-CM | POA: Diagnosis not present

## 2018-02-19 DIAGNOSIS — M9901 Segmental and somatic dysfunction of cervical region: Secondary | ICD-10-CM | POA: Diagnosis not present

## 2018-02-19 DIAGNOSIS — R51 Headache: Secondary | ICD-10-CM | POA: Diagnosis not present

## 2018-02-19 DIAGNOSIS — M9900 Segmental and somatic dysfunction of head region: Secondary | ICD-10-CM | POA: Diagnosis not present

## 2018-02-23 DIAGNOSIS — M542 Cervicalgia: Secondary | ICD-10-CM | POA: Diagnosis not present

## 2018-02-23 DIAGNOSIS — R51 Headache: Secondary | ICD-10-CM | POA: Diagnosis not present

## 2018-02-23 DIAGNOSIS — M9900 Segmental and somatic dysfunction of head region: Secondary | ICD-10-CM | POA: Diagnosis not present

## 2018-02-23 DIAGNOSIS — M9901 Segmental and somatic dysfunction of cervical region: Secondary | ICD-10-CM | POA: Diagnosis not present

## 2018-02-25 DIAGNOSIS — M542 Cervicalgia: Secondary | ICD-10-CM | POA: Diagnosis not present

## 2018-02-25 DIAGNOSIS — M9900 Segmental and somatic dysfunction of head region: Secondary | ICD-10-CM | POA: Diagnosis not present

## 2018-02-25 DIAGNOSIS — M9901 Segmental and somatic dysfunction of cervical region: Secondary | ICD-10-CM | POA: Diagnosis not present

## 2018-02-25 DIAGNOSIS — R51 Headache: Secondary | ICD-10-CM | POA: Diagnosis not present

## 2018-02-26 DIAGNOSIS — M542 Cervicalgia: Secondary | ICD-10-CM | POA: Diagnosis not present

## 2018-02-26 DIAGNOSIS — R51 Headache: Secondary | ICD-10-CM | POA: Diagnosis not present

## 2018-02-26 DIAGNOSIS — M9901 Segmental and somatic dysfunction of cervical region: Secondary | ICD-10-CM | POA: Diagnosis not present

## 2018-02-26 DIAGNOSIS — M9900 Segmental and somatic dysfunction of head region: Secondary | ICD-10-CM | POA: Diagnosis not present

## 2018-03-02 DIAGNOSIS — R51 Headache: Secondary | ICD-10-CM | POA: Diagnosis not present

## 2018-03-02 DIAGNOSIS — M9901 Segmental and somatic dysfunction of cervical region: Secondary | ICD-10-CM | POA: Diagnosis not present

## 2018-03-02 DIAGNOSIS — M542 Cervicalgia: Secondary | ICD-10-CM | POA: Diagnosis not present

## 2018-03-02 DIAGNOSIS — M9900 Segmental and somatic dysfunction of head region: Secondary | ICD-10-CM | POA: Diagnosis not present

## 2018-03-04 DIAGNOSIS — M542 Cervicalgia: Secondary | ICD-10-CM | POA: Diagnosis not present

## 2018-03-04 DIAGNOSIS — M9900 Segmental and somatic dysfunction of head region: Secondary | ICD-10-CM | POA: Diagnosis not present

## 2018-03-04 DIAGNOSIS — M9901 Segmental and somatic dysfunction of cervical region: Secondary | ICD-10-CM | POA: Diagnosis not present

## 2018-03-04 DIAGNOSIS — R51 Headache: Secondary | ICD-10-CM | POA: Diagnosis not present

## 2018-03-09 DIAGNOSIS — M9900 Segmental and somatic dysfunction of head region: Secondary | ICD-10-CM | POA: Diagnosis not present

## 2018-03-09 DIAGNOSIS — M542 Cervicalgia: Secondary | ICD-10-CM | POA: Diagnosis not present

## 2018-03-09 DIAGNOSIS — M9901 Segmental and somatic dysfunction of cervical region: Secondary | ICD-10-CM | POA: Diagnosis not present

## 2018-03-09 DIAGNOSIS — R51 Headache: Secondary | ICD-10-CM | POA: Diagnosis not present

## 2018-03-11 ENCOUNTER — Other Ambulatory Visit: Payer: Self-pay | Admitting: Cardiovascular Disease

## 2018-03-11 DIAGNOSIS — R51 Headache: Secondary | ICD-10-CM | POA: Diagnosis not present

## 2018-03-11 DIAGNOSIS — M9901 Segmental and somatic dysfunction of cervical region: Secondary | ICD-10-CM | POA: Diagnosis not present

## 2018-03-11 DIAGNOSIS — M9900 Segmental and somatic dysfunction of head region: Secondary | ICD-10-CM | POA: Diagnosis not present

## 2018-03-11 DIAGNOSIS — M542 Cervicalgia: Secondary | ICD-10-CM | POA: Diagnosis not present

## 2018-03-11 NOTE — Telephone Encounter (Signed)
Rx request sent to pharmacy.  

## 2018-03-12 DIAGNOSIS — R51 Headache: Secondary | ICD-10-CM | POA: Diagnosis not present

## 2018-03-12 DIAGNOSIS — M9900 Segmental and somatic dysfunction of head region: Secondary | ICD-10-CM | POA: Diagnosis not present

## 2018-03-12 DIAGNOSIS — M9901 Segmental and somatic dysfunction of cervical region: Secondary | ICD-10-CM | POA: Diagnosis not present

## 2018-03-12 DIAGNOSIS — M542 Cervicalgia: Secondary | ICD-10-CM | POA: Diagnosis not present

## 2018-03-16 DIAGNOSIS — M542 Cervicalgia: Secondary | ICD-10-CM | POA: Diagnosis not present

## 2018-03-16 DIAGNOSIS — M9901 Segmental and somatic dysfunction of cervical region: Secondary | ICD-10-CM | POA: Diagnosis not present

## 2018-03-16 DIAGNOSIS — R51 Headache: Secondary | ICD-10-CM | POA: Diagnosis not present

## 2018-03-16 DIAGNOSIS — M9900 Segmental and somatic dysfunction of head region: Secondary | ICD-10-CM | POA: Diagnosis not present

## 2018-03-23 DIAGNOSIS — M9901 Segmental and somatic dysfunction of cervical region: Secondary | ICD-10-CM | POA: Diagnosis not present

## 2018-03-23 DIAGNOSIS — R51 Headache: Secondary | ICD-10-CM | POA: Diagnosis not present

## 2018-03-23 DIAGNOSIS — M542 Cervicalgia: Secondary | ICD-10-CM | POA: Diagnosis not present

## 2018-03-23 DIAGNOSIS — M9900 Segmental and somatic dysfunction of head region: Secondary | ICD-10-CM | POA: Diagnosis not present

## 2018-03-26 DIAGNOSIS — M542 Cervicalgia: Secondary | ICD-10-CM | POA: Diagnosis not present

## 2018-03-26 DIAGNOSIS — M9901 Segmental and somatic dysfunction of cervical region: Secondary | ICD-10-CM | POA: Diagnosis not present

## 2018-03-26 DIAGNOSIS — M9900 Segmental and somatic dysfunction of head region: Secondary | ICD-10-CM | POA: Diagnosis not present

## 2018-03-26 DIAGNOSIS — R51 Headache: Secondary | ICD-10-CM | POA: Diagnosis not present

## 2018-03-31 DIAGNOSIS — M9901 Segmental and somatic dysfunction of cervical region: Secondary | ICD-10-CM | POA: Diagnosis not present

## 2018-03-31 DIAGNOSIS — M542 Cervicalgia: Secondary | ICD-10-CM | POA: Diagnosis not present

## 2018-03-31 DIAGNOSIS — R51 Headache: Secondary | ICD-10-CM | POA: Diagnosis not present

## 2018-03-31 DIAGNOSIS — M9900 Segmental and somatic dysfunction of head region: Secondary | ICD-10-CM | POA: Diagnosis not present

## 2018-04-06 DIAGNOSIS — R51 Headache: Secondary | ICD-10-CM | POA: Diagnosis not present

## 2018-04-06 DIAGNOSIS — M9901 Segmental and somatic dysfunction of cervical region: Secondary | ICD-10-CM | POA: Diagnosis not present

## 2018-04-06 DIAGNOSIS — M542 Cervicalgia: Secondary | ICD-10-CM | POA: Diagnosis not present

## 2018-04-06 DIAGNOSIS — M9900 Segmental and somatic dysfunction of head region: Secondary | ICD-10-CM | POA: Diagnosis not present

## 2018-04-09 DIAGNOSIS — M9900 Segmental and somatic dysfunction of head region: Secondary | ICD-10-CM | POA: Diagnosis not present

## 2018-04-09 DIAGNOSIS — M542 Cervicalgia: Secondary | ICD-10-CM | POA: Diagnosis not present

## 2018-04-09 DIAGNOSIS — M9901 Segmental and somatic dysfunction of cervical region: Secondary | ICD-10-CM | POA: Diagnosis not present

## 2018-04-09 DIAGNOSIS — R51 Headache: Secondary | ICD-10-CM | POA: Diagnosis not present

## 2018-04-10 ENCOUNTER — Other Ambulatory Visit: Payer: Self-pay | Admitting: Cardiovascular Disease

## 2018-04-14 DIAGNOSIS — M542 Cervicalgia: Secondary | ICD-10-CM | POA: Diagnosis not present

## 2018-04-14 DIAGNOSIS — R51 Headache: Secondary | ICD-10-CM | POA: Diagnosis not present

## 2018-04-14 DIAGNOSIS — M9900 Segmental and somatic dysfunction of head region: Secondary | ICD-10-CM | POA: Diagnosis not present

## 2018-04-14 DIAGNOSIS — M9901 Segmental and somatic dysfunction of cervical region: Secondary | ICD-10-CM | POA: Diagnosis not present

## 2018-04-22 DIAGNOSIS — M542 Cervicalgia: Secondary | ICD-10-CM | POA: Diagnosis not present

## 2018-04-22 DIAGNOSIS — M9900 Segmental and somatic dysfunction of head region: Secondary | ICD-10-CM | POA: Diagnosis not present

## 2018-04-22 DIAGNOSIS — M9901 Segmental and somatic dysfunction of cervical region: Secondary | ICD-10-CM | POA: Diagnosis not present

## 2018-04-22 DIAGNOSIS — R51 Headache: Secondary | ICD-10-CM | POA: Diagnosis not present

## 2018-04-29 DIAGNOSIS — M9901 Segmental and somatic dysfunction of cervical region: Secondary | ICD-10-CM | POA: Diagnosis not present

## 2018-04-29 DIAGNOSIS — R51 Headache: Secondary | ICD-10-CM | POA: Diagnosis not present

## 2018-04-29 DIAGNOSIS — M9900 Segmental and somatic dysfunction of head region: Secondary | ICD-10-CM | POA: Diagnosis not present

## 2018-04-29 DIAGNOSIS — M542 Cervicalgia: Secondary | ICD-10-CM | POA: Diagnosis not present

## 2018-05-12 DIAGNOSIS — M9900 Segmental and somatic dysfunction of head region: Secondary | ICD-10-CM | POA: Diagnosis not present

## 2018-05-12 DIAGNOSIS — M9901 Segmental and somatic dysfunction of cervical region: Secondary | ICD-10-CM | POA: Diagnosis not present

## 2018-05-12 DIAGNOSIS — R51 Headache: Secondary | ICD-10-CM | POA: Diagnosis not present

## 2018-05-12 DIAGNOSIS — M542 Cervicalgia: Secondary | ICD-10-CM | POA: Diagnosis not present

## 2018-07-07 ENCOUNTER — Telehealth: Payer: Self-pay

## 2018-07-07 ENCOUNTER — Other Ambulatory Visit: Payer: Self-pay | Admitting: Cardiovascular Disease

## 2018-07-07 NOTE — Telephone Encounter (Signed)
Pt came in to office with c/o recent increase in chest pain, "squeezing" sensation mid-chest, "like someone has their hand in my chest". Pt also reports he got very faint at work yesterday and had to lay down in the floor, possibly passed out and was down for about 56mins. Coworkers wanted to call 911 but pt refused. Pt states chest pain comes and goes, but does not go away. He has significant cardiac hx with multiple stents. He reports he took several nitroglycerin yesterday with some relief. He is very hesitant to go to ED. He has not contacted cardiologist because he "knows they will send him to the ER".   Spoke with pt's provider and he advised pt be evaluated in ED immediately. Spoke with pt and discussed, he is very hesitant. Reviewed his medical history and significance of recent symptoms. He finally agrees to go to [facility] ED for evaluation.   [place]. Thanks.

## 2018-07-08 ENCOUNTER — Other Ambulatory Visit: Payer: Self-pay

## 2018-07-08 ENCOUNTER — Emergency Department (HOSPITAL_BASED_OUTPATIENT_CLINIC_OR_DEPARTMENT_OTHER): Payer: BLUE CROSS/BLUE SHIELD

## 2018-07-08 ENCOUNTER — Encounter (HOSPITAL_BASED_OUTPATIENT_CLINIC_OR_DEPARTMENT_OTHER): Payer: Self-pay

## 2018-07-08 ENCOUNTER — Emergency Department (HOSPITAL_BASED_OUTPATIENT_CLINIC_OR_DEPARTMENT_OTHER)
Admission: EM | Admit: 2018-07-08 | Discharge: 2018-07-08 | Discharge status: Against Medical Advice | Payer: BLUE CROSS/BLUE SHIELD | Attending: Emergency Medicine | Admitting: Emergency Medicine

## 2018-07-08 DIAGNOSIS — Z7902 Long term (current) use of antithrombotics/antiplatelets: Secondary | ICD-10-CM | POA: Insufficient documentation

## 2018-07-08 DIAGNOSIS — Z955 Presence of coronary angioplasty implant and graft: Secondary | ICD-10-CM | POA: Diagnosis not present

## 2018-07-08 DIAGNOSIS — I2510 Atherosclerotic heart disease of native coronary artery without angina pectoris: Secondary | ICD-10-CM | POA: Diagnosis not present

## 2018-07-08 DIAGNOSIS — Z79899 Other long term (current) drug therapy: Secondary | ICD-10-CM | POA: Insufficient documentation

## 2018-07-08 DIAGNOSIS — R911 Solitary pulmonary nodule: Secondary | ICD-10-CM | POA: Diagnosis not present

## 2018-07-08 DIAGNOSIS — Z7982 Long term (current) use of aspirin: Secondary | ICD-10-CM | POA: Diagnosis not present

## 2018-07-08 DIAGNOSIS — I10 Essential (primary) hypertension: Secondary | ICD-10-CM | POA: Diagnosis not present

## 2018-07-08 DIAGNOSIS — E871 Hypo-osmolality and hyponatremia: Secondary | ICD-10-CM | POA: Diagnosis not present

## 2018-07-08 DIAGNOSIS — R079 Chest pain, unspecified: Secondary | ICD-10-CM | POA: Diagnosis not present

## 2018-07-08 DIAGNOSIS — F17210 Nicotine dependence, cigarettes, uncomplicated: Secondary | ICD-10-CM | POA: Diagnosis not present

## 2018-07-08 DIAGNOSIS — E876 Hypokalemia: Secondary | ICD-10-CM | POA: Diagnosis not present

## 2018-07-08 DIAGNOSIS — J449 Chronic obstructive pulmonary disease, unspecified: Secondary | ICD-10-CM | POA: Diagnosis not present

## 2018-07-08 DIAGNOSIS — R55 Syncope and collapse: Secondary | ICD-10-CM | POA: Diagnosis not present

## 2018-07-08 DIAGNOSIS — R0789 Other chest pain: Secondary | ICD-10-CM | POA: Diagnosis not present

## 2018-07-08 LAB — CBC
HEMATOCRIT: 45.4 % (ref 39.0–52.0)
HEMOGLOBIN: 15.9 g/dL (ref 13.0–17.0)
MCH: 35.1 pg — AB (ref 26.0–34.0)
MCHC: 35 g/dL (ref 30.0–36.0)
MCV: 100.2 fL — ABNORMAL HIGH (ref 80.0–100.0)
Platelets: 341 10*3/uL (ref 150–400)
RBC: 4.53 MIL/uL (ref 4.22–5.81)
RDW: 13 % (ref 11.5–15.5)
WBC: 8.3 10*3/uL (ref 4.0–10.5)
nRBC: 0 % (ref 0.0–0.2)

## 2018-07-08 LAB — COMPREHENSIVE METABOLIC PANEL
ALK PHOS: 63 U/L (ref 38–126)
ALT: 15 U/L (ref 0–44)
AST: 25 U/L (ref 15–41)
Albumin: 4.3 g/dL (ref 3.5–5.0)
Anion gap: 10 (ref 5–15)
BUN: 5 mg/dL — AB (ref 8–23)
CHLORIDE: 91 mmol/L — AB (ref 98–111)
CO2: 26 mmol/L (ref 22–32)
CREATININE: 0.5 mg/dL — AB (ref 0.61–1.24)
Calcium: 9.1 mg/dL (ref 8.9–10.3)
GFR calc Af Amer: >60 mL/min (ref >60)
GLUCOSE: 108 mg/dL — AB (ref 70–99)
Potassium: 2.9 mmol/L — ABNORMAL LOW (ref 3.5–5.1)
Sodium: 127 mmol/L — ABNORMAL LOW (ref 135–145)
Total Bilirubin: 0.5 mg/dL (ref 0.3–1.2)
Total Protein: 7.7 g/dL (ref 6.5–8.1)

## 2018-07-08 LAB — TROPONIN I: Troponin I: <0.03 ng/mL (ref <0.03)

## 2018-07-08 MED ORDER — POTASSIUM CHLORIDE CRYS ER 20 MEQ PO TBCR
60.0000 meq | EXTENDED_RELEASE_TABLET | Freq: Once | ORAL | Status: AC
  Administered 2018-07-08: 60 meq via ORAL
  Filled 2018-07-08: qty 3

## 2018-07-08 MED ORDER — IOPAMIDOL (ISOVUE-370) INJECTION 76%
100.0000 mL | Freq: Once | INTRAVENOUS | Status: AC | PRN
  Administered 2018-07-08: 100 mL via INTRAVENOUS

## 2018-07-08 NOTE — Discharge Instructions (Addendum)
It was my pleasure taking care of you today. I am very sorry about your results and will be thinking of you and your family. As we discussed, I will send your primary doctor a message about your visit and want you to call him and your cardiologist tomorrow.   Go directly to [facility] Emergency Department if your symptoms worsen or if you change your mind about hospital admission.

## 2018-07-08 NOTE — ED Notes (Signed)
NAD at this time. Pt is leaving AMA. PA-C and RN spoke with Pt about the importance of staying here and getting treatment. Pt is leaving on his own will.

## 2018-07-08 NOTE — ED Provider Notes (Signed)
[facility] EMERGENCY DEPARTMENT Provider Note   CSN: [id number] Arrival date & time: 07/08/18  1443     History   Chief Complaint Chief Complaint  Patient presents with  . Chest Pain    HPI Sean Rose is a 62 y.o. male.  The history is provided by the patient and medical records. No language interpreter was used.  Chest Pain   Associated symptoms include diaphoresis.   Sean Rose is a 62 y.o. male  with a PMH of CAD s/p stent placement, PAD, HTN, HLD, daily smoker who presents to the Emergency Department complaining of central chest pain intermittently over the last 2-3 days, however constant since this morning.  Patient states that 2 days ago, while at work, he suddenly became very diaphoretic and hot.  He then had a full syncopal episode.  He is unsure of how long he was unconscious, but believes it was just a few minutes.  Since then, he has continued to have a central chest pain which he describes as a squeezing pressure.  This feels similar to the pain he has experienced in the past leading to his stent placement.  He does not feel as if the pain is worse with exertion.  Sometimes it will be severe even sitting at rest.  He has taken nitro several times which did seem to improve his symptoms.  He took one this morning.   Past Medical History:  Diagnosis Date  . Anginal pain ([place])   . CAD (coronary artery disease)   . CAP (community acquired pneumonia) 09/2016  . COPD (chronic obstructive pulmonary disease) ([place])   . GERD (gastroesophageal reflux disease)   . Heart murmur    "I was told I had one when I was a kid"  . Hemorrhoids   . History of anal fissures    "no surgeries" (10/30/2016)  . Hyperlipidemia   . Hypertension   . Peripheral arterial disease ([place])    status post right common iliac artery stenting back in 2007  . Seasonal allergies   . Tobacco abuse     Patient Active Problem List   Diagnosis Date Noted  . Nausea vomiting and diarrhea  10/30/2016  . Hyponatremia 10/30/2016  . Hypocalcemia 10/30/2016  . Hypokalemia 04/14/2015  . Hypomagnesemia 04/14/2015  . Claudication ([place]) 04/13/2015  . Essential hypertension 01/14/2014  . Hyperlipidemia 01/14/2014  . Thrombosed external hemorrhoids 12/07/2013  . Chronic anticoagulation 01/20/2013  . PVD (peripheral vascular disease) ([place]) 01/20/2013  . Coronary atherosclerosis of native coronary artery 01/20/2013  . GERD (gastroesophageal reflux disease) 01/20/2013  . Benign esophageal stricture 01/20/2013  . DENTAL CARIES 07/20/2010    Past Surgical History:  Procedure Laterality Date  . ANKLE SURGERY Left    "rebuilt it"  . ANTERIOR CRUCIATE LIGAMENT REPAIR Right   . CARDIAC CATHETERIZATION  09/23/2008   Continued medical therapy - may need GI evaluation in addition.  Marland Kitchen CARDIAC CATHETERIZATION  10/28/2007   Medical therapy recommended.  Marland Kitchen CARDIAC CATHETERIZATION  11/18/2006   In-stent restenosis RCA  (50% distal edge, 80% segmental mid, and 50-60% segmental proximal). Successful cutting balloon atherectomy using a [id number] cutting balloon. 3 inflations with atherectomy performed on mid and proximal portions resulting in reduction of 80% mid in-stent restenosis to less than 20% residual and 50-60% segmental proximal to less than 20% residual without dissection.  Marland Kitchen CARDIAC CATHETERIZATION  02/26/2006   Severe stenosis in RCA. Stenting performed using IVUS. 3.5x20 Maverick balloon deployed at [employer]. Distal  stent-a 4x28 Liberte stent-deployed 12atm 48sec, 12atm 31sec, 4atm 19sec. Mid stent-a 4x28 Liberte stent-deployed 14atm 45sec, 14atm 60sec, 14atm 44sec. Proximal stent-4x8 Liberte- 14atm 45sec,14atm 47sec, 16atm 43sec. Severely diseased segment then appeared TIMI-3 flow.  Marland Kitchen CARDIOVASCULAR STRESS TEST  11/17/2012   No significant ECG changes. Septal perfusion defect is new when complared to study from 2010. Abnormal myocardial perfusion imaging with a basal to mid perfusion suggestive  of previous MI.  Marland Kitchen CAROTID DOPPLER  08/09/2011   Bilateral Bulb/Proximal ICA - demonstrated a mild amount of fibrous plaque without evidence of significant diameter reduction reduction or other vascular abnormality.  . COLONOSCOPY     2003, 2014  . CORONARY ANGIOPLASTY WITH STENT PLACEMENT    . FEMORAL ARTERY STENT    . INGUINAL HERNIA REPAIR Right   . KNEE ARTHROSCOPY Right "multiple"  . LOWER EXTREMITY ARTERIAL DOPPLER  01/31/2011   Bilateral ABIs-normal values with no suggestion of arterial insuff to the lower extremities at rest. Right CIA stent-mild amount of nonhemodynamically significant plaque is noted throughout  . 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 PERCUTANEOUS STENT INTERVENTION  04/04/2006 & 04/13/2015   a. Right common iliac artery with an 8.0x18 mm Herculink stent deployed at 12 atm. Stenosis was reduced from 80% to 0% with brisk flow. b. I-cast stenting to left common iliac artery  . PERIPHERAL VASCULAR CATHETERIZATION N/A 04/13/2015   Procedure: Lower Extremity Angiography;  Surgeon: Lorretta Harp, MD; L-oCIA 75%, 40-50% L-EIA, R-CIA stent patent, s/p 8 mm x 38 mm ICast covered stent>>0% stenosis in L-oCIA     . SHOULDER ARTHROSCOPY WITH ROTATOR CUFF REPAIR Right   . TRANSTHORACIC ECHOCARDIOGRAM  11/26/2012   EF not noted. Aortic valve-sclerosis without stenosis, no regurgiation.   Marland Kitchen UPPER GASTROINTESTINAL ENDOSCOPY    . US CAROTID DOPPLER BILATERAL ([place] [facility])  08/09/2011   Bilateral Bulb/Proximal ICAa demonstrated a mild amount of fibrous plaque without evidence of significant diameter reduction or any other vascular abnormality.        Home Medications    Prior to Admission medications   Medication Sig Start Date End Date Taking? Authorizing Provider  amLODipine (NORVASC) 5 MG tablet TAKE 1 TABLET(5 MG) BY MOUTH DAILY 07/07/18   Lorretta Harp, MD  amoxicillin-clavulanate (AUGMENTIN) 875-125 MG tablet Take 1 tablet by mouth 2 (two) times daily. 01/05/18    Laurey Morale, MD  aspirin EC 81 MG tablet Take 81 mg by mouth daily.    [provider]  clopidogrel (PLAVIX) 75 MG tablet TAKE 1 TABLET BY MOUTH EVERY DAY 04/10/18   Lorretta Harp, MD  diphenhydrAMINE (BENADRYL) 25 mg capsule Take 25 mg by mouth every 6 (six) hours as needed for allergies.    [provider]  esomeprazole (NEXIUM) 40 MG capsule Take 40 mg by mouth daily.     [provider]  ibuprofen (ADVIL,MOTRIN) 200 MG tablet Take 200 mg by mouth every 6 (six) hours as needed for headache.    [provider]  Magnesium 250 MG TABS Take 1 tablet by mouth daily.    [provider]  metoprolol succinate (TOPROL-XL) 100 MG 24 hr tablet TAKE 1 TABLET BY MOUTH EVERY DAY 07/07/18   Lorretta Harp, MD  Multiple Vitamin (MULTIVITAMIN WITH MINERALS) TABS tablet Take 1 tablet by mouth daily.    [provider]  nitroGLYCERIN (NITROSTAT) 0.4 MG SL tablet PLACE 1 TABLET UNDER THE TONGUE EVERY 5 MINUTES AS NEEDED FOR CHEST PAIN  09/11/17   Lorretta Harp, MD    Family History Family History  Problem Relation Age of Onset  . Colon cancer Mother   . Heart disease Father   . Heart disease Paternal Grandfather     Social History Social History   Tobacco Use  . Smoking status: Current Every Day Smoker    Packs/day: 1.50    Years: 38.00    Pack years: 57.00    Types: Cigarettes  . Smokeless tobacco: Never Used  Substance Use Topics  . Alcohol use: Yes    Alcohol/week: 36.0 standard drinks    Types: 36 Cans of beer per week    Comment: 10/30/2016 "I usually drink 4-8 beers qd"  . Drug use: No     Allergies   Oxycodone-acetaminophen and Tylox [oxycodone-acetaminophen]   Review of Systems Review of Systems  Constitutional: Positive for diaphoresis.  Cardiovascular: Positive for chest pain.  Neurological: Positive for syncope (3 days ago).  All other systems reviewed and are negative.    Physical Exam Updated Vital  Signs BP (!) 175/92 (BP Location: Left Arm)   Pulse 66   Temp 97.9 F (36.6 C) (Oral)   Resp 18   SpO2 100%   Physical Exam  Constitutional: He is oriented to person, place, and time. He appears well-developed and well-nourished. No distress.  HENT:  Head: Normocephalic and atraumatic.  Cardiovascular: Normal heart sounds.  No murmur heard. RRR on exam  Pulmonary/Chest: Effort normal and breath sounds normal. No respiratory distress.  Abdominal: Soft. He exhibits no distension. There is no tenderness.  Musculoskeletal: He exhibits no edema.  Neurological: He is alert and oriented to person, place, and time.  Skin: Skin is warm and dry.  Nursing note and vitals reviewed.    ED Treatments / Results  Labs (all labs ordered are listed, but only abnormal results are displayed) Labs Reviewed  CBC - Abnormal; Notable for the following components:      Result Value   MCV 100.2 (*)    MCH 35.1 (*)    All other components within normal limits  COMPREHENSIVE METABOLIC PANEL - Abnormal; Notable for the following components:   Sodium 127 (*)    Potassium 2.9 (*)    Chloride 91 (*)    Glucose, Bld 108 (*)    BUN 5 (*)    Creatinine, Ser 0.50 (*)    All other components within normal limits  TROPONIN I    EKG EKG Interpretation  Date/Time:  Wednesday July 08 2018 14:53:49 EST Ventricular Rate:  55 PR Interval:  124 QRS Duration: 88 QT Interval:  420 QTC Calculation: 401 R Axis:   74 Text Interpretation:  Sinus bradycardia Minimal voltage criteria for LVH, may be normal variant Borderline ECG Confirmed by Gerlene Fee [phone]) on 07/08/2018 3:46:19 PM   Radiology Dg Chest 2 View  Result Date: 07/08/2018 CLINICAL DATA:  The patient a syncopal episode 07/06/2018. Squeezing sensation in the chest today. EXAM: CHEST - 2 VIEW COMPARISON:  None. FINDINGS: A masslike opacity measuring 4.9 cm in diameter is seen in the right lower lung zone on the frontal view. There is also  a right upper lobe pulmonary nodule measuring 1.5 cm in diameter. The lungs appear emphysematous. No consolidative process or pneumothorax. Heart size is normal. Atherosclerosis noted. No acute or focal bony abnormality. IMPRESSION: Masslike opacity the right lower lung zone with a pulmonary nodule in the right upper lobe highly suspicious for carcinoma and new since the prior  exam. Recommend chest CT with contrast for further evaluation. Emphysema. Atherosclerosis. Electronically Signed   By: Inge Rise M.D.   On: 07/08/2018 15:27   Ct Angio Chest Pe W And/or Wo Contrast  Result Date: 07/08/2018 CLINICAL DATA:  Sternal chest pain and tightness for the past 3 days. History of COPD. Current smoker. Abnormal preceding chest radiograph. Evaluate for pulmonary embolism. EXAM: CT ANGIOGRAPHY CHEST WITH CONTRAST TECHNIQUE: Multidetector CT imaging of the chest was performed using the standard protocol during bolus administration of intravenous contrast. Multiplanar CT image reconstructions and MIPs were obtained to evaluate the vascular anatomy. CONTRAST:  110m ISOVUE-370 IOPAMIDOL (ISOVUE-370) INJECTION 76% COMPARISON:  Chest radiograph-07/08/2018; 10/30/2016 FINDINGS: Vascular Findings: There is adequate opacification of the pulmonary arterial system with the main pulmonary artery measuring 454 Hounsfield units. There are no discrete filling defects within the pulmonary arterial tree to suggest pulmonary embolism. Normal caliber of the main pulmonary artery. Borderline cardiomegaly. No pericardial effusion. Coronary artery calcifications. Scattered mixed calcified and noncalcified atherosclerotic plaque with a normal caliber thoracic aorta, not resulting in hemodynamically significant stenosis. Conventional configuration of the aortic arch. The branch vessels of the aortic arch appear widely patent throughout their imaged courses. The descending thoracic aorta is tortuous but of normal caliber. Review of the  MIP images confirms the above findings. ---------------------------------------------------------------------------------- Nonvascular Findings: Mediastinum/Lymph Nodes: Right suprahilar lymph node is not enlarged by size criteria measuring 0.7 cm in greatest short axis diameter (image 52, series 4). No bulky mediastinal, hilar axillary lymphadenopathy. Lungs/Pleura: Advanced apical predominant mixed centrilobular and paraseptal emphysematous change. There are several scattered spiculated nodules within the right upper lobe which correlate with the findings seen on preceding chest radiograph. Most worrisome slightly spiculated nodule within the right upper lobe measures approximately 1.1 x 1.1 x 1.4 cm (axial image 35, series 5; coronal image 73, series 7). Slightly less well-defined but somewhat spiculated nodule within the right upper lobe adjacent to the posterior aspect the right major fissure measures approximately 2.0 x 1.0 x 1.0 cm (axial image 34, series 5; coronal image 106, series 7). Additional scattered pulmonary nodules within the right upper lobe measure approximately 1.4 x 0.9 x 0.9 cm (axial image 29, series 5; coronal image 80, series 7), 1.0 cm (image 31, series 5) and 0.8 cm (image 26, series 5). No additional nodules are seen outside the right upper lobe. No associated bulky mediastinal or hilar lymphadenopathy. Minimal dependent subpleural ground-glass atelectasis. No discrete focal airspace opacities. No pleural effusion or pneumothorax. The central pulmonary airways appear widely patent. Upper abdomen: Limited early arterial phase evaluation of the upper abdomen demonstrates mild thickening of the left adrenal gland without definitive nodule. Musculoskeletal: No acute or aggressive osseous abnormalities. Mild (approximately 25%) compression deformity involving the superior endplate of the TB63vertebral body without associated fracture line or paraspinal hematoma. Regional soft tissues appear  normal. Normal appearance of the thyroid gland. IMPRESSION: 1. No evidence of pulmonary embolism. 2. Scattered right upper lobe pulmonary nodules, the most worrisome of which measures approximately 1.4 cm in diameter, correlate with the nodules seen on preceding chest radiograph. While nonspecific, given patient's underlying advanced emphysematous change, findings are worrisome for synchronous bronchogenic carcinoma. If patient currently has infectious symptoms, potential imaging strategies could include empiric treatment with follow-up chest CT in 4-6 weeks. If patient is currently asymptomatic, further evaluation with PET-CT could be performed as indicated. 3. Emphysema (ICD10-J43.9). 4. Borderline cardiomegaly. 5. Coronary artery calcifications. Aortic Atherosclerosis (ICD10-I70.0). Electronically Signed   By: JJenny Reichmann  Watts M.D.   On: 07/08/2018 17:27    Procedures Procedures (including critical care time)  Medications Ordered in ED Medications  iopamidol (ISOVUE-370) 76 % injection 100 mL (100 mLs Intravenous Contrast Given 07/08/18 1658)  potassium chloride SA (K-DUR,KLOR-CON) CR tablet 60 mEq (60 mEq Oral Given 07/08/18 1610)     Initial Impression / Assessment and Plan / ED Course  I have reviewed the triage vital signs and the nursing notes.  Pertinent labs & imaging results that were available during my care of the patient were reviewed by me and considered in my medical decision making (see chart for details).    Sean Rose is a 62 y.o. male who presents to ED for chest pain similar to his previous cardiac chest pain. Improved with nitro. Actually had chest pain with syncopal event 2 days ago and did not seek care. History is quite concerning. Fortunately, his EKG shows no acute ischemic changes and troponin is negative. CXR shows new opacity in the right lung. CT angio with no PE, but does have findings concerning for bronchogenic carcinoma. He is not having any infectious symptoms to  suggest PNA. I STRONGLY encouraged patient that he needed admission for high risk chest pain as well as further evaluation of likely new bronchogenic cancer. I have discussed my concerns as a provider and the possibility that this may worsen. We discussed the nature, risks and benefits, and alternatives to treatment. I have specifically discussed that without further evaluation I cannot guarantee there is not a life threatening event occuring.  Time was given to allow the opportunity to ask questions and consider the options, and after the discussion, the patient decided to refuse admission. Pt is A&Ox4, his own POA and states understanding of my concerns and the possible consequences. After refusal, I made every reasonable opportunity to treat them to the best of my ability. I have made the patient aware that this is an [place] discharge, but he may return at any time for further evaluation and treatment. I will send a message to his PCP. He agrees to call his PCP and cardiologist in the morning to arrange for further workup. We discussed that he should go directly to [facility] ER if symptoms worsen or if he changes his mind about admission tonight. Another attempt to get patient to stay, but he declined again. Discharged AMA.    Patient discussed with Dr. Sedonia Small who agrees with treatment plan.   Final Clinical Impressions(s) / ED Diagnoses   Final diagnoses:  Chest pain with high risk for cardiac etiology  Hyponatremia  Hypokalemia    ED Discharge Orders    None       Ward, Ozella Almond, PA-C 07/08/18 1820    Maudie Flakes, MD 07/08/18 2354

## 2018-07-08 NOTE — ED Triage Notes (Signed)
C/o CP x 3 days-syncopal episodes 2 days ago-pt went to PCP yesterday-LWBS due to wait-NAD-steady gait

## 2018-07-09 ENCOUNTER — Telehealth: Payer: Self-pay | Admitting: Adult Health

## 2018-07-09 NOTE — Telephone Encounter (Signed)
Left a message on identified voicemail for the pt to call back and schedule an appointment.  His diagnosis can be discussed at that time.  Nothing further needed at this time.

## 2018-07-09 NOTE — Telephone Encounter (Signed)
Copied from [place] [id number]. Topic: General - Other >> Jul 09, 2018  9:14 AM Keene Breath wrote: Reason for CRM: Patient called to request that the doctor call him to get his advice on a diagnosis the patient has received.  Patient only said that it was a medical issue that he would like to discuss with Dr. Tommi Rumps before going further.  CB# [phone]

## 2018-07-09 NOTE — Telephone Encounter (Signed)
Called the pt again and went straight to voicemail.  Advised the pt to call back and schedule an appt.  Unsure why PEC did not schedule when he returned call the first time.

## 2018-07-09 NOTE — Telephone Encounter (Signed)
Patient is returning call to [facility] regarding some advice that he needs to go forward with a diagnosis he recently received.  Please call patient back at [phone]

## 2018-07-10 ENCOUNTER — Ambulatory Visit (INDEPENDENT_AMBULATORY_CARE_PROVIDER_SITE_OTHER): Payer: BLUE CROSS/BLUE SHIELD | Admitting: Adult Health

## 2018-07-10 ENCOUNTER — Encounter: Payer: Self-pay | Admitting: Adult Health

## 2018-07-10 VITALS — BP 162/84 | Temp 98.2°F | Wt 127.0 lb

## 2018-07-10 DIAGNOSIS — R911 Solitary pulmonary nodule: Secondary | ICD-10-CM

## 2018-07-10 NOTE — Progress Notes (Signed)
Subjective:    Patient ID: Sean Rose, male    DOB: 1955/08/17, 62 y.o.   MRN: [id number]  HPI 62 year old male who  has a past medical history of Anginal pain ([place]), CAD (coronary artery disease), CAP (community acquired pneumonia) (09/2016), COPD (chronic obstructive pulmonary disease) ([place]), GERD (gastroesophageal reflux disease), Heart murmur, Hemorrhoids, History of anal fissures, Hyperlipidemia, Hypertension, Peripheral arterial disease ([place]), Seasonal allergies, and Tobacco abuse.  He presents to the office today for follow up after being seen in the ER for chest pain. He reports that his chest pain has resolved but during his ER visit CT of the chest revealed:  Scattered right upper lobe pulmonary nodules, the most worrisome of which measures approximately 1.4 cm in diameter, correlate with the nodules seen on preceding chest radiograph. While nonspecific, given patient's underlying advanced emphysematous change, findings are worrisome for synchronous bronchogenic carcinoma. If patient currently has infectious symptoms, potential imaging strategies could include empiric treatment with follow-up chest CT in 4-6 weeks. If patient is currently asymptomatic, further evaluation with PET-CT could be performed as indicated.   Review of Systems See HPI   Past Medical History:  Diagnosis Date  . Anginal pain ([place])   . CAD (coronary artery disease)   . CAP (community acquired pneumonia) 09/2016  . COPD (chronic obstructive pulmonary disease) ([place])   . GERD (gastroesophageal reflux disease)   . Heart murmur    "I was told I had one when I was a kid"  . Hemorrhoids   . History of anal fissures    "no surgeries" (10/30/2016)  . Hyperlipidemia   . Hypertension   . Peripheral arterial disease ([place])    status post right common iliac artery stenting back in 2007  . Seasonal allergies   . Tobacco abuse     Social History   Socioeconomic History  . Marital status: Married   Spouse name: Not on file  . Number of children: Not on file  . Years of education: Not on file  . Highest education level: Not on file  Occupational History  . Not on file  Social Needs  . Financial resource strain: Not on file  . Food insecurity:    Worry: Not on file    Inability: Not on file  . Transportation needs:    Medical: Not on file    Non-medical: Not on file  Tobacco Use  . Smoking status: Current Every Day Smoker    Packs/day: 1.50    Years: 38.00    Pack years: 57.00    Types: Cigarettes  . Smokeless tobacco: Never Used  Substance and Sexual Activity  . Alcohol use: Yes    Alcohol/week: 36.0 standard drinks    Types: 36 Cans of beer per week    Comment: 10/30/2016 "I usually drink 4-8 beers qd"  . Drug use: No  . Sexual activity: Yes  Lifestyle  . Physical activity:    Days per week: Not on file    Minutes per session: Not on file  . Stress: Not on file  Relationships  . Social connections:    Talks on phone: Not on file    Gets together: Not on file    Attends religious service: Not on file    Active member of club or organization: Not on file    Attends meetings of clubs or organizations: Not on file    Relationship status: Not on file  . Intimate partner violence:    Fear  of current or ex partner: Not on file    Emotionally abused: Not on file    Physically abused: Not on file    Forced sexual activity: Not on file  Other Topics Concern  . Not on file  Social History Narrative  . Not on file    Past Surgical History:  Procedure Laterality Date  . ANKLE SURGERY Left    "rebuilt it"  . ANTERIOR CRUCIATE LIGAMENT REPAIR Right   . CARDIAC CATHETERIZATION  09/23/2008   Continued medical therapy - may need GI evaluation in addition.  Marland Kitchen CARDIAC CATHETERIZATION  10/28/2007   Medical therapy recommended.  Marland Kitchen CARDIAC CATHETERIZATION  11/18/2006   In-stent restenosis RCA  (50% distal edge, 80% segmental mid, and 50-60% segmental proximal). Successful  cutting balloon atherectomy using a 325X15 cutting balloon. 3 inflations with atherectomy performed on mid and proximal portions resulting in reduction of 80% mid in-stent restenosis to less than 20% residual and 50-60% segmental proximal to less than 20% residual without dissection.  Marland Kitchen CARDIAC CATHETERIZATION  02/26/2006   Severe stenosis in RCA. Stenting performed using IVUS. 3.5x20 Maverick balloon deployed at [facility]. Distal stent-a 4x28 Liberte stent-deployed 12atm 48sec, 12atm 31sec, 4atm 19sec. Mid stent-a 4x28 Liberte stent-deployed 14atm 45sec, 14atm 60sec, 14atm 44sec. Proximal stent-4x8 Liberte- 14atm 45sec,14atm 47sec, 16atm 43sec. Severely diseased segment then appeared TIMI-3 flow.  Marland Kitchen CARDIOVASCULAR STRESS TEST  11/17/2012   No significant ECG changes. Septal perfusion defect is new when complared to study from 2010. Abnormal myocardial perfusion imaging with a basal to mid perfusion suggestive of previous MI.  Marland Kitchen CAROTID DOPPLER  08/09/2011   Bilateral Bulb/Proximal ICA - demonstrated a mild amount of fibrous plaque without evidence of significant diameter reduction reduction or other vascular abnormality.  . COLONOSCOPY     2003, 2014  . CORONARY ANGIOPLASTY WITH STENT PLACEMENT    . FEMORAL ARTERY STENT    . INGUINAL HERNIA REPAIR Right   . KNEE ARTHROSCOPY Right "multiple"  . LOWER EXTREMITY ARTERIAL DOPPLER  01/31/2011   Bilateral ABIs-normal values with no suggestion of arterial insuff to the lower extremities at rest. Right CIA stent-mild amount of nonhemodynamically significant plaque is noted throughout  . 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 PERCUTANEOUS STENT INTERVENTION  04/04/2006 & 04/13/2015   a. Right common iliac artery with an 8.0x18 mm Herculink stent deployed at 12 atm. Stenosis was reduced from 80% to 0% with brisk flow. b. I-cast stenting to left common iliac artery  . PERIPHERAL VASCULAR CATHETERIZATION N/A 04/13/2015   Procedure: Lower Extremity  Angiography;  Surgeon: Lorretta Harp, MD; L-oCIA 75%, 40-50% L-EIA, R-CIA stent patent, s/p 8 mm x 38 mm ICast covered stent>>0% stenosis in L-oCIA     . SHOULDER ARTHROSCOPY WITH ROTATOR CUFF REPAIR Right   . TRANSTHORACIC ECHOCARDIOGRAM  11/26/2012   EF not noted. Aortic valve-sclerosis without stenosis, no regurgiation.   Marland Kitchen UPPER GASTROINTESTINAL ENDOSCOPY    . US CAROTID DOPPLER BILATERAL ([place] [facility])  08/09/2011   Bilateral Bulb/Proximal ICAa demonstrated a mild amount of fibrous plaque without evidence of significant diameter reduction or any other vascular abnormality.    Family History  Problem Relation Age of Onset  . Colon cancer Mother   . Heart disease Father   . Heart disease Paternal Grandfather     Allergies  Allergen Reactions  . Oxycodone-Acetaminophen Nausea Only  . Tylox [Oxycodone-Acetaminophen] Nausea Only  . Tyloxapol Nausea Only    Current Outpatient Medications on  File Prior to Visit  Medication Sig Dispense Refill  . amLODipine (NORVASC) 5 MG tablet TAKE 1 TABLET(5 MG) BY MOUTH DAILY 60 tablet 0  . aspirin EC 81 MG tablet Take 81 mg by mouth daily.    . clopidogrel (PLAVIX) 75 MG tablet TAKE 1 TABLET BY MOUTH EVERY DAY 90 tablet 1  . diphenhydrAMINE (BENADRYL) 25 mg capsule Take 25 mg by mouth every 6 (six) hours as needed for allergies.    Marland Kitchen ibuprofen (ADVIL,MOTRIN) 200 MG tablet Take 200 mg by mouth every 6 (six) hours as needed for headache.    . Magnesium 250 MG TABS Take 1 tablet by mouth daily.    . metoprolol succinate (TOPROL-XL) 100 MG 24 hr tablet TAKE 1 TABLET BY MOUTH EVERY DAY 30 tablet 0  . Multiple Vitamin (MULTIVITAMIN WITH MINERALS) TABS tablet Take 1 tablet by mouth daily.    . nitroGLYCERIN (NITROSTAT) 0.4 MG SL tablet PLACE 1 TABLET UNDER THE TONGUE EVERY 5 MINUTES AS NEEDED FOR CHEST PAIN 25 tablet 6  . esomeprazole (NEXIUM) 40 MG capsule Take 40 mg by mouth daily.      No current facility-administered medications on file prior to visit.      BP (!) 162/84   Temp 98.2 F (36.8 C)   Wt 127 lb (57.6 kg)   BMI 19.31 kg/m       Objective:   Physical Exam Vitals signs and nursing note reviewed.  Constitutional:      Appearance: Normal appearance.  Cardiovascular:     Rate and Rhythm: Normal rate and regular rhythm.  Pulmonary:     Effort: Pulmonary effort is normal.     Breath sounds: Normal breath sounds.  Skin:    General: Skin is warm and dry.     Capillary Refill: Capillary refill takes less than 2 seconds.  Neurological:     General: No focal deficit present.     Mental Status: He is alert.  Psychiatric:        Mood and Affect: Mood normal.        Behavior: Behavior normal.        Thought Content: Thought content normal.        Judgment: Judgment normal.       Assessment & Plan:  1. Lung nodule -We had a long discussion about recent CT findings.  He would like to aggressively pursue this.  He is and appears to be in good spirits.  All questions answered to the best of my ability.  We will send to pulmonary urgent referral.  Was advised to feel free to contact me at any time if he has any additional questions - Ambulatory referral to Pulmonology  [name], NP

## 2018-07-14 ENCOUNTER — Ambulatory Visit (INDEPENDENT_AMBULATORY_CARE_PROVIDER_SITE_OTHER): Payer: BLUE CROSS/BLUE SHIELD | Admitting: Pulmonary Disease

## 2018-07-14 ENCOUNTER — Encounter: Payer: Self-pay | Admitting: Pulmonary Disease

## 2018-07-14 VITALS — BP 150/80 | HR 57 | Ht 67.5 in | Wt 126.0 lb

## 2018-07-14 DIAGNOSIS — Z7902 Long term (current) use of antithrombotics/antiplatelets: Secondary | ICD-10-CM

## 2018-07-14 DIAGNOSIS — R911 Solitary pulmonary nodule: Secondary | ICD-10-CM | POA: Diagnosis not present

## 2018-07-14 DIAGNOSIS — R918 Other nonspecific abnormal finding of lung field: Secondary | ICD-10-CM | POA: Insufficient documentation

## 2018-07-14 DIAGNOSIS — F17200 Nicotine dependence, unspecified, uncomplicated: Secondary | ICD-10-CM

## 2018-07-14 LAB — PROTIME-INR
INR: 1 ratio (ref 0.8–1.0)
PROTHROMBIN TIME: 12.2 s (ref 9.6–13.1)

## 2018-07-14 NOTE — Progress Notes (Signed)
Synopsis: Referred in December 2019 for RUL lung nodule by [name], NP  Subjective:   PATIENT ID: Sean Rose GENDER: male DOB: [birth year], MRN: [id number]  Chief Complaint  Patient presents with  . Consult    States he was feeling bad, more fatigued than usual that led up to him fainting in his office. He went to ER to be evaluated and they did not find anything wrong. He states he went to his PCP and they did a CXR and CT was told he has Lung Cancer. States he smokes pack per day. States he has always had intermittent SOB that he attributed to his smoking and lack of exercise.     PMH tobacco abuse, COPD, GERD. Recently went to the ED after passing out. He had blood work and a CT chest. CT revealed RUL nodules and the patient was referred here for further. Works in an Marketing executive, manages a Teacher, English as a foreign language supplies. New boarder collie mix in the home. No exotic animals. No recent travel out of the country. Smoker since college, 40+ years, varies 1-1.5ppd. He has attempted to stop in the past. He has used patches, gum and chantix. He had horrible nightmares. He lost 10 to 12 pounds over the past month.  He also has noticed a significant decrease in his appetite.  Patient denies hemoptysis.  Of note he does take Plavix regularly.  He has had cardiac stents as well as femoral stents.  None within the past year.   Past Medical History:  Diagnosis Date  . Anginal pain ([place])   . CAD (coronary artery disease)   . CAP (community acquired pneumonia) 09/2016  . COPD (chronic obstructive pulmonary disease) ([place])   . GERD (gastroesophageal reflux disease)   . Heart murmur    "I was told I had one when I was a kid"  . Hemorrhoids   . History of anal fissures    "no surgeries" (10/30/2016)  . Hyperlipidemia   . Hypertension   . Peripheral arterial disease ([place])    status post right common iliac artery stenting back in 2007  . Seasonal allergies   . Tobacco abuse      Family History  Problem Relation Age of Onset  . Colon cancer Mother   . Heart disease Father   . Heart disease Paternal Grandfather      Past Surgical History:  Procedure Laterality Date  . ANKLE SURGERY Left    "rebuilt it"  . ANTERIOR CRUCIATE LIGAMENT REPAIR Right   . CARDIAC CATHETERIZATION  09/23/2008   Continued medical therapy - may need GI evaluation in addition.  Marland Kitchen CARDIAC CATHETERIZATION  10/28/2007   Medical therapy recommended.  Marland Kitchen CARDIAC CATHETERIZATION  11/18/2006   In-stent restenosis RCA  (50% distal edge, 80% segmental mid, and 50-60% segmental proximal). Successful cutting balloon atherectomy using a 325X15 cutting balloon. 3 inflations with atherectomy performed on mid and proximal portions resulting in reduction of 80% mid in-stent restenosis to less than 20% residual and 50-60% segmental proximal to less than 20% residual without dissection.  Marland Kitchen CARDIAC CATHETERIZATION  02/26/2006   Severe stenosis in RCA. Stenting performed using IVUS. 3.5x20 Maverick balloon deployed at [facility]. Distal stent-a 4x28 Liberte stent-deployed 12atm 48sec, 12atm 31sec, 4atm 19sec. Mid stent-a 4x28 Liberte stent-deployed 14atm 45sec, 14atm 60sec, 14atm 44sec. Proximal stent-4x8 Liberte- 14atm 45sec,14atm 47sec, 16atm 43sec. Severely diseased segment then appeared TIMI-3 flow.  Marland Kitchen CARDIOVASCULAR STRESS TEST  11/17/2012   No significant ECG  changes. Septal perfusion defect is new when complared to study from 2010. Abnormal myocardial perfusion imaging with a basal to mid perfusion suggestive of previous MI.  Marland Kitchen CAROTID DOPPLER  08/09/2011   Bilateral Bulb/Proximal ICA - demonstrated a mild amount of fibrous plaque without evidence of significant diameter reduction reduction or other vascular abnormality.  . COLONOSCOPY     2003, 2014  . CORONARY ANGIOPLASTY WITH STENT PLACEMENT    . FEMORAL ARTERY STENT    . INGUINAL HERNIA REPAIR Right   . KNEE ARTHROSCOPY Right "multiple"  . LOWER EXTREMITY  ARTERIAL DOPPLER  01/31/2011   Bilateral ABIs-normal values with no suggestion of arterial insuff to the lower extremities at rest. Right CIA stent-mild amount of nonhemodynamically significant plaque is noted throughout  . 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 PERCUTANEOUS STENT INTERVENTION  04/04/2006 & 04/13/2015   a. Right common iliac artery with an 8.0x18 mm Herculink stent deployed at 12 atm. Stenosis was reduced from 80% to 0% with brisk flow. b. I-cast stenting to left common iliac artery  . PERIPHERAL VASCULAR CATHETERIZATION N/A 04/13/2015   Procedure: Lower Extremity Angiography;  Surgeon: Lorretta Harp, MD; L-oCIA 75%, 40-50% L-EIA, R-CIA stent patent, s/p 8 mm x 38 mm ICast covered stent>>0% stenosis in L-oCIA     . SHOULDER ARTHROSCOPY WITH ROTATOR CUFF REPAIR Right   . TRANSTHORACIC ECHOCARDIOGRAM  11/26/2012   EF not noted. Aortic valve-sclerosis without stenosis, no regurgiation.   Marland Kitchen UPPER GASTROINTESTINAL ENDOSCOPY    . US CAROTID DOPPLER BILATERAL ([place] [facility])  08/09/2011   Bilateral Bulb/Proximal ICAa demonstrated a mild amount of fibrous plaque without evidence of significant diameter reduction or any other vascular abnormality.    Social History   Socioeconomic History  . Marital status: Married    Spouse name: Not on file  . Number of children: Not on file  . Years of education: Not on file  . Highest education level: Not on file  Occupational History  . Not on file  Social Needs  . Financial resource strain: Not on file  . Food insecurity:    Worry: Not on file    Inability: Not on file  . Transportation needs:    Medical: Not on file    Non-medical: Not on file  Tobacco Use  . Smoking status: Current Every Day Smoker    Packs/day: 1.50    Years: 38.00    Pack years: 57.00    Types: Cigarettes  . Smokeless tobacco: Never Used  . Tobacco comment: pack per day 12.17.19  Substance and Sexual Activity  . Alcohol use: Yes    Alcohol/week: 36.0  standard drinks    Types: 36 Cans of beer per week    Comment: 10/30/2016 "I usually drink 4-8 beers qd"  . Drug use: No  . Sexual activity: Yes  Lifestyle  . Physical activity:    Days per week: Not on file    Minutes per session: Not on file  . Stress: Not on file  Relationships  . Social connections:    Talks on phone: Not on file    Gets together: Not on file    Attends religious service: Not on file    Active member of club or organization: Not on file    Attends meetings of clubs or organizations: Not on file    Relationship status: Not on file  . Intimate partner violence:    Fear of current or ex partner: Not on  file    Emotionally abused: Not on file    Physically abused: Not on file    Forced sexual activity: Not on file  Other Topics Concern  . Not on file  Social History Narrative  . Not on file     Allergies  Allergen Reactions  . Oxycodone-Acetaminophen Nausea Only  . Tylox [Oxycodone-Acetaminophen] Nausea Only  . Tyloxapol Nausea Only     Outpatient Medications Prior to Visit  Medication Sig Dispense Refill  . amLODipine (NORVASC) 5 MG tablet TAKE 1 TABLET(5 MG) BY MOUTH DAILY 60 tablet 0  . aspirin EC 81 MG tablet Take 81 mg by mouth daily.    . clopidogrel (PLAVIX) 75 MG tablet TAKE 1 TABLET BY MOUTH EVERY DAY 90 tablet 1  . diphenhydrAMINE (BENADRYL) 25 mg capsule Take 25 mg by mouth every 6 (six) hours as needed for allergies.    Marland Kitchen ibuprofen (ADVIL,MOTRIN) 200 MG tablet Take 200 mg by mouth every 6 (six) hours as needed for headache.    . Magnesium 250 MG TABS Take 1 tablet by mouth daily.    . metoprolol succinate (TOPROL-XL) 100 MG 24 hr tablet TAKE 1 TABLET BY MOUTH EVERY DAY 30 tablet 0  . Multiple Vitamin (MULTIVITAMIN WITH MINERALS) TABS tablet Take 1 tablet by mouth daily.    . nitroGLYCERIN (NITROSTAT) 0.4 MG SL tablet PLACE 1 TABLET UNDER THE TONGUE EVERY 5 MINUTES AS NEEDED FOR CHEST PAIN 25 tablet 6  . esomeprazole (NEXIUM) 40 MG capsule Take  40 mg by mouth daily.      No facility-administered medications prior to visit.     Review of Systems  Constitutional: Positive for chills and fever. Negative for diaphoresis, malaise/fatigue and weight loss.  HENT: Negative.   Eyes: Negative.   Respiratory: Positive for shortness of breath. Negative for cough, hemoptysis, sputum production and wheezing.   Cardiovascular: Positive for chest pain, palpitations and orthopnea. Negative for leg swelling and PND.  Gastrointestinal: Negative.   Genitourinary: Negative.   Musculoskeletal: Positive for falls. Negative for back pain, joint pain, myalgias and neck pain.  Skin: Negative.   Neurological: Negative.   Endo/Heme/Allergies: Negative.   Psychiatric/Behavioral: Negative.      Objective:  Physical Exam Vitals signs reviewed.  Constitutional:      General: He is not in acute distress.    Appearance: He is well-developed.  HENT:     Head: Normocephalic and atraumatic.     Comments: Temporalis muscle wasting    Mouth/Throat:     Pharynx: No oropharyngeal exudate.  Eyes:     Conjunctiva/sclera: Conjunctivae normal.     Pupils: Pupils are equal, round, and reactive to light.  Neck:     Vascular: No JVD.     Trachea: No tracheal deviation.     Comments: Loss of supraclavicular fat Cardiovascular:     Rate and Rhythm: Normal rate and regular rhythm.     Heart sounds: S1 normal and S2 normal.     Comments: Distant heart tones Pulmonary:     Effort: No tachypnea or accessory muscle usage.     Breath sounds: No stridor. Decreased breath sounds (throughout all lung fields) present. No wheezing, rhonchi or rales.  Abdominal:     General: Bowel sounds are normal. There is no distension.     Palpations: Abdomen is soft.     Tenderness: There is no abdominal tenderness.  Musculoskeletal:        General: Deformity (muscle wasting ) present.  Skin:  General: Skin is warm and dry.     Capillary Refill: Capillary refill takes less  than 2 seconds.     Findings: No rash.  Neurological:     Mental Status: He is alert and oriented to person, place, and time.  Psychiatric:        Behavior: Behavior normal.      Vitals:   07/14/18 0900  BP: (!) 150/80  Pulse: (!) 57  SpO2: 99%  Weight: 126 lb (57.2 kg)  Height: 5' 7.5" (1.715 m)   99% on RA BMI Readings from Last 3 Encounters:  07/14/18 19.44 kg/m  07/10/18 19.31 kg/m  01/05/18 19.10 kg/m   Wt Readings from Last 3 Encounters:  07/14/18 126 lb (57.2 kg)  07/10/18 127 lb (57.6 kg)  01/05/18 125 lb 9.6 oz (57 kg)     CBC    Component Value Date/Time   WBC 8.3 07/08/2018 1511   RBC 4.53 07/08/2018 1511   HGB 15.9 07/08/2018 1511   HCT 45.4 07/08/2018 1511   PLT 341 07/08/2018 1511   MCV 100.2 (H) 07/08/2018 1511   MCH 35.1 (H) 07/08/2018 1511   MCHC 35.0 07/08/2018 1511   RDW 13.0 07/08/2018 1511   LYMPHSABS 1.3 05/05/2017 1149   MONOABS 0.6 05/05/2017 1149   EOSABS 0.0 05/05/2017 1149   BASOSABS 0.1 05/05/2017 1149    Chest Imaging:  07/08/2018: Right upper lobe spiculated lesion, 1.1 x 1.1 x 1.4 cm, additional more posterior upper lobe against the major fissure 2 x 1 x 1 cm nodule with associated groundglass digit in all central advised upper lobe nodule as well.  There is multiple associated areas of significant centrilobular emphysema.  Pulmonary Functions Testing Results: No flowsheet data found.  FeNO: None   Pathology: None   Echocardiogram: None   Heart Catheterization: None     Assessment & Plan:   Nodule of upper lobe of right lung - Plan: Pulmonary function test, QuantiFERON-TB Gold Plus  Antiplatelet or antithrombotic long-term use - Plan: Pulmonary function test, QuantiFERON-TB Gold Plus  Multiple lung nodules on CT - Plan: Pulmonary function test, QuantiFERON-TB Gold Plus  Solitary pulmonary nodule - Plan: NM PET Image Initial (PI) Skull Base To Thigh, INR/PT, QuantiFERON-TB Gold Plus  Current  smoker  Discussion:  This is a 62 year old gentleman with a significant smoking history.  Recently seen in the ER with CT imaging that revealed a incidental right upper lobe lung nodules. One of which is very concerning for probability of malignancy.  However 2 others also have changes of probability of malignancy.  It is however unusual for synchronous upper lobe bronchogenic carcinoma however is a possibility.  Today in the office we discussed to the algorithm for evaluation to include pet imaging as well as obtaining super D imaging of his current CT scan. We will also need to obtain pulmonary function tests.  Pending pulmonary function tests patient may be a consideration for total right upper lobectomy pending PET if there is no distant metastasis.  Today reviewed the CT imaging with the patient in the office today. We will complete the following: PET scan ASAP PFTs as soon as possible Convert current CT imagings to 1 mm cuts super D. Tentatively plan for potential bronchoscopy, ENB/radial on January 8. Once we are confirmed for a bronchoscopy date the patient must hold Plavix 5 to 7 days prior to procedure.  We will need to obtain approval from his cardiologist.  Greater than 50% of this patient 60-minute office  visit was spent face-to-face discussing the above recommendations and treatment plan as well as risks versus benefits of pursuing interventional diagnostic bronchoscopy to include navigational bronchoscopy for sampling of the nodules as described above.   Current Outpatient Medications:  .  amLODipine (NORVASC) 5 MG tablet, TAKE 1 TABLET(5 MG) BY MOUTH DAILY, Disp: 60 tablet, Rfl: 0 .  aspirin EC 81 MG tablet, Take 81 mg by mouth daily., Disp: , Rfl:  .  clopidogrel (PLAVIX) 75 MG tablet, TAKE 1 TABLET BY MOUTH EVERY DAY, Disp: 90 tablet, Rfl: 1 .  diphenhydrAMINE (BENADRYL) 25 mg capsule, Take 25 mg by mouth every 6 (six) hours as needed for allergies., Disp: , Rfl:  .  ibuprofen  (ADVIL,MOTRIN) 200 MG tablet, Take 200 mg by mouth every 6 (six) hours as needed for headache., Disp: , Rfl:  .  Magnesium 250 MG TABS, Take 1 tablet by mouth daily., Disp: , Rfl:  .  metoprolol succinate (TOPROL-XL) 100 MG 24 hr tablet, TAKE 1 TABLET BY MOUTH EVERY DAY, Disp: 30 tablet, Rfl: 0 .  Multiple Vitamin (MULTIVITAMIN WITH MINERALS) TABS tablet, Take 1 tablet by mouth daily., Disp: , Rfl:  .  nitroGLYCERIN (NITROSTAT) 0.4 MG SL tablet, PLACE 1 TABLET UNDER THE TONGUE EVERY 5 MINUTES AS NEEDED FOR CHEST PAIN, Disp: 25 tablet, Rfl: 6   Garner Nash, DO [facility] 07/14/2018 9:31 AM

## 2018-07-14 NOTE — Addendum Note (Signed)
Addended by: Suzzanne Cloud E on: 07/14/2018 09:55 AM   Modules accepted: Orders

## 2018-07-14 NOTE — Patient Instructions (Addendum)
Thank you for visiting Dr. Valeta Harms at [facility]. Today we recommend the following: Orders Placed This Encounter  Procedures  . NM PET Image Initial (PI) Skull Base To Thigh  . Pulmonary function test   I will call with the results of the PET scan.   Return in about 4 weeks (around 08/11/2018).

## 2018-07-15 ENCOUNTER — Ambulatory Visit: Payer: BLUE CROSS/BLUE SHIELD | Admitting: Pulmonary Disease

## 2018-07-15 ENCOUNTER — Telehealth: Payer: Self-pay

## 2018-07-15 DIAGNOSIS — R911 Solitary pulmonary nodule: Secondary | ICD-10-CM

## 2018-07-15 DIAGNOSIS — R918 Other nonspecific abnormal finding of lung field: Secondary | ICD-10-CM

## 2018-07-15 DIAGNOSIS — Z7902 Long term (current) use of antithrombotics/antiplatelets: Secondary | ICD-10-CM

## 2018-07-15 LAB — PULMONARY FUNCTION TEST
DL/VA % pred: 59 %
DL/VA: 2.67 ml/min/mmHg/L
DLCO unc % pred: 62 %
DLCO unc: 18.46 ml/min/mmHg
FEF 25-75 Post: 3.7 L/sec
FEF 25-75 Pre: 3.06 L/sec
FEF2575-%Change-Post: 21 %
FEF2575-%Pred-Post: 137 %
FEF2575-%Pred-Pre: 113 %
FEV1-%Change-Post: 1 %
FEV1-%Pred-Post: 90 %
FEV1-%Pred-Pre: 89 %
FEV1-POST: 2.99 L
FEV1-Pre: 2.95 L
FEV1FVC-%Change-Post: -31 %
FEV1FVC-%Pred-Pre: 120 %
FEV6-%Change-Post: 46 %
FEV6-%PRED-POST: 115 %
FEV6-%Pred-Pre: 78 %
FEV6-Post: 4.8 L
FEV6-Pre: 3.27 L
FEV6FVC-%Change-Post: 0 %
FEV6FVC-%PRED-PRE: 105 %
FEV6FVC-%Pred-Post: 104 %
FVC-%Change-Post: 47 %
FVC-%PRED-POST: 109 %
FVC-%Pred-Pre: 74 %
FVC-Post: 4.81 L
FVC-Pre: 3.27 L
POST FEV1/FVC RATIO: 62 %
Post FEV6/FVC ratio: 100 %
Pre FEV1/FVC ratio: 90 %
Pre FEV6/FVC Ratio: 100 %
RV % pred: 149 %
RV: 3.24 L
TLC % pred: 114 %
TLC: 7.58 L

## 2018-07-15 NOTE — Progress Notes (Signed)
PFT completed 07/15/18

## 2018-07-15 NOTE — Telephone Encounter (Signed)
Primary Cardiologist: [facility] Pre-operative Risk Assessment    Request for surgical clearance:  1. What type of surgery is being performed?  Bronchoscopy  2. When is this surgery scheduled?  TBD   3. What type of clearance is required (medical clearance vs. Pharmacy clearance to hold med vs. Both)? Both   4. Are there any medications that need to be held prior to surgery and how long? Plavix  5. Practice name and name of physician performing surgery?  [facility]   6. What is your office phone number [phone]   7.   What is your office fax number [phone]  8.   Anesthesia type (None, local, MAC, general) ? Not listed   Lamar Laundry 07/15/2018, 1:30 PM  _________________________________________________________________   (provider comments below)

## 2018-07-16 LAB — QUANTIFERON-TB GOLD PLUS
Mitogen-NIL: 7.88 [IU]/mL
NIL: 0.04 [IU]/mL
QuantiFERON-TB Gold Plus: NEGATIVE
TB1-NIL: <0 [IU]/mL
TB2-NIL: <0 [IU]/mL

## 2018-07-17 ENCOUNTER — Telehealth: Payer: Self-pay | Admitting: Pulmonary Disease

## 2018-07-17 NOTE — Telephone Encounter (Signed)
Call made to patient, made aware of results of TB test per BI. Voiced understanding. Nothing further is needed at this time

## 2018-07-20 NOTE — Telephone Encounter (Signed)
   Primary Cardiologist: Quay Burow, MD  Chart reviewed as part of pre-operative protocol coverage. Patient was contacted 07/20/2018 in reference to pre-operative risk assessment for pending surgery as outlined below.  AJENE CARCHI was last seen on 07/15/2017 by Dr. Gwenlyn Found.  Since that day, NYSHAWN GOWDY has done well except for a recent passing out spell  Therefore, based on ACC/AHA guidelines, the patient would be at acceptable risk for the planned procedure without further cardiovascular testing.   I will route this recommendation to the requesting party via Epic fax function and remove from pre-op pool.  Please call with questions.   I have personally spoke to Mr. Refugio, there was questionable possible lung cancer on the recent CT of the chest.  He has upcoming PET scan this Friday and potential bronchoscopy afterward.  After discussing with Mr. Osiris, we both agreed that additional testing should not delay evaluation.  I discussed the case with Dr. Gwenlyn Found, his cardiologist, we will cleared the patient to hold Plavix for 5 to 7 days for potential bronchoscopy with biopsy.  He is cleared for the bronchoscopy procedure.  However, Mr. Roch is aware that if open chest surgery or lobectomy is required after pathology come back, he will need office visit and stress test prior to any open chest surgery.   Noted, although I initially recommended office visit prior to bronchoscopy given the recent ED visit, however according to patient, he did have a passing out spell but he denies any chest pain as documented by the ED physician.  Since office visit, he has not had any more passing out spell or dizziness.  I will tentatively schedule him appointment to see Dr. Gwenlyn Found in January, however he is already cleared to proceed with bronchoscopy prior to that and hold Plavix for 5 to 7 days prior to the procedure.  Office staff to contact patient to arrange followup  Almyra Deforest, [place] 07/20/2018, 5:06  PM

## 2018-07-20 NOTE — Telephone Encounter (Signed)
   Primary Cardiologist:[name], MD  Chart reviewed as part of pre-operative protocol coverage. Because of Sean Rose's past medical history and time since last visit, he/she will require a follow-up visit in order to better assess preoperative cardiovascular risk.  Pre-op covering staff: - Please schedule appointment and call patient to inform them. - Please contact requesting surgeon's office via preferred method (i.e, phone, fax) to inform them of need for appointment prior to surgery.  If applicable, this message will also be routed to pharmacy pool and/or primary cardiologist for input on holding anticoagulant/antiplatelet agent as requested below so that this information is available at time of patient's appointment.   [place], [place]  07/20/2018, 3:45 PM

## 2018-07-20 NOTE — Telephone Encounter (Signed)
Called patient to advise that he needed an appointment to be cleared. LVM with call back number.  Will contact [facility] once appointment is made.

## 2018-07-23 ENCOUNTER — Ambulatory Visit (INDEPENDENT_AMBULATORY_CARE_PROVIDER_SITE_OTHER): Payer: BLUE CROSS/BLUE SHIELD | Admitting: Cardiovascular Disease

## 2018-07-23 ENCOUNTER — Encounter: Payer: Self-pay | Admitting: Cardiovascular Disease

## 2018-07-23 DIAGNOSIS — I10 Essential (primary) hypertension: Secondary | ICD-10-CM

## 2018-07-23 DIAGNOSIS — E7800 Pure hypercholesterolemia, unspecified: Secondary | ICD-10-CM

## 2018-07-23 DIAGNOSIS — I2510 Atherosclerotic heart disease of native coronary artery without angina pectoris: Secondary | ICD-10-CM | POA: Diagnosis not present

## 2018-07-23 DIAGNOSIS — I739 Peripheral vascular disease, unspecified: Secondary | ICD-10-CM | POA: Diagnosis not present

## 2018-07-23 DIAGNOSIS — R55 Syncope and collapse: Secondary | ICD-10-CM | POA: Insufficient documentation

## 2018-07-23 NOTE — Patient Instructions (Signed)
Medication Instructions:  Your physician recommends that you continue on your current medications as directed. Please refer to the Current Medication list given to you today.  If you need a refill on your cardiac medications before your next appointment, please call your pharmacy.   Lab work: NONE If you have labs (blood work) drawn today and your tests are completely normal, you will receive your results only by: Marland Kitchen MyChart Message (if you have MyChart) OR . A paper copy in the mail If you have any lab test that is abnormal or we need to change your treatment, we will call you to review the results.  Testing/Procedures: NONE  Follow-Up: At [facility], you and your health needs are our priority.  As part of our continuing mission to provide you with exceptional heart care, we have created designated Provider Care Teams.  These Care Teams include your primary Cardiologist (physician) and Advanced Practice Providers (APPs -  Physician Assistants and Nurse Practitioners) who all work together to provide you with the care you need, when you need it. You will need a follow up appointment in 12 months.  Please call our office 2 months in advance to schedule this appointment.  You may see Quay Burow, MD or one of the following Advanced Practice Providers on your designated Care Team:   Kerin Ransom, PA-C Roby Lofts, [place] . Sande Rives, PA-C  Any Other Special Instructions Will Be Listed Below (If Applicable).

## 2018-07-23 NOTE — Assessment & Plan Note (Signed)
History of hyperlipidemia not on statin therapy with lipid profile performed 03/05/2017 revealing total cholesterol 162, LDL 73 and HDL of 59.

## 2018-07-23 NOTE — Progress Notes (Signed)
07/23/2018 Sean Rose   11/09/1955  [id number]  Primary Physician [name], NP Primary Cardiologist: Lorretta Harp MD FACP, [place], [place], [place]  HPI:  Sean Rose is a 62 y.o.  thin appearing married Caucasian male father of 2 who I last saw in the office  07/15/2017. He has a history of RCA stenting in the past with bare metal stents. He has had in-stent restenosis with re-dilatation approximately 4 years ago. He has also had remote right common iliac artery PTA and stenting back in 2007. His other problems include hypertension, hyperlipidemia and continued tobacco abuse at 1-1/2 packs per day despite counseling to the contrary. He denies chest pain , shortness of breath .He was complaining of some claudication back in September and we ultimately proceeded with angiography 04/13/15 revealing a widely patent right iliac stent with 75% ostial left common iliac artery stenosis with a 40 mm gradient. I ultimately stented this with an 8 mm x 38 mm long ICastcovered stent with excellent angiographic clinical and ultrasonographic result. He no longer has claudication. His most recent Dopplers performed 08/16/16 revealed normal ABIs by bilaterally with widely patent stents. Since I saw him a year ago he's remained stable. He denies chest pain or shortness of breath.  He has had an episode of syncope on 07/06/2018 at work which sounded vagal to me.  He unfortunately was recently diagnosed with a lung nodule and is scheduled for a PET scan tomorrow with concerns of malignancy.  He continues to smoke a pack and a half of cigarettes a day.    Current Meds  Medication Sig  . amLODipine (NORVASC) 5 MG tablet TAKE 1 TABLET(5 MG) BY MOUTH DAILY  . aspirin EC 81 MG tablet Take 81 mg by mouth daily.  . clopidogrel (PLAVIX) 75 MG tablet TAKE 1 TABLET BY MOUTH EVERY DAY  . diphenhydrAMINE (BENADRYL) 25 mg capsule Take 25 mg by mouth every 6 (six) hours as needed for allergies.  Marland Kitchen ibuprofen  (ADVIL,MOTRIN) 200 MG tablet Take 200 mg by mouth every 6 (six) hours as needed for headache.  . Magnesium 250 MG TABS Take 1 tablet by mouth daily.  . metoprolol succinate (TOPROL-XL) 100 MG 24 hr tablet TAKE 1 TABLET BY MOUTH EVERY DAY  . Multiple Vitamin (MULTIVITAMIN WITH MINERALS) TABS tablet Take 1 tablet by mouth daily.  . nitroGLYCERIN (NITROSTAT) 0.4 MG SL tablet PLACE 1 TABLET UNDER THE TONGUE EVERY 5 MINUTES AS NEEDED FOR CHEST PAIN     Allergies  Allergen Reactions  . Oxycodone-Acetaminophen Nausea Only  . Tylox [Oxycodone-Acetaminophen] Nausea Only  . Tyloxapol Nausea Only    Social History   Socioeconomic History  . Marital status: Married    Spouse name: Not on file  . Number of children: Not on file  . Years of education: Not on file  . Highest education level: Not on file  Occupational History  . Not on file  Social Needs  . Financial resource strain: Not on file  . Food insecurity:    Worry: Not on file    Inability: Not on file  . Transportation needs:    Medical: Not on file    Non-medical: Not on file  Tobacco Use  . Smoking status: Current Every Day Smoker    Packs/day: 1.50    Years: 38.00    Pack years: 57.00    Types: Cigarettes  . Smokeless tobacco: Never Used  . Tobacco comment: pack per day 12.17.19  Substance and Sexual Activity  . Alcohol use: Yes    Alcohol/week: 36.0 standard drinks    Types: 36 Cans of beer per week    Comment: 10/30/2016 "I usually drink 4-8 beers qd"  . Drug use: No  . Sexual activity: Yes  Lifestyle  . Physical activity:    Days per week: Not on file    Minutes per session: Not on file  . Stress: Not on file  Relationships  . Social connections:    Talks on phone: Not on file    Gets together: Not on file    Attends religious service: Not on file    Active member of club or organization: Not on file    Attends meetings of clubs or organizations: Not on file    Relationship status: Not on file  . Intimate  partner violence:    Fear of current or ex partner: Not on file    Emotionally abused: Not on file    Physically abused: Not on file    Forced sexual activity: Not on file  Other Topics Concern  . Not on file  Social History Narrative  . Not on file     Review of Systems: General: negative for chills, fever, night sweats or weight changes.  Cardiovascular: negative for chest pain, dyspnea on exertion, edema, orthopnea, palpitations, paroxysmal nocturnal dyspnea or shortness of breath Dermatological: negative for rash Respiratory: negative for cough or wheezing Urologic: negative for hematuria Abdominal: negative for nausea, vomiting, diarrhea, bright red blood per rectum, melena, or hematemesis Neurologic: negative for visual changes, syncope, or dizziness All other systems reviewed and are otherwise negative except as noted above.    Blood pressure 138/70, pulse 60, height 5\' 8"  (1.727 m), weight 129 lb (58.5 kg).  General appearance: alert and no distress Neck: no adenopathy, no carotid bruit, no JVD, supple, symmetrical, trachea midline and thyroid not enlarged, symmetric, no tenderness/mass/nodules Lungs: clear to auscultation bilaterally Heart: regular rate and rhythm, S1, S2 normal, no murmur, click, rub or gallop Extremities: extremities normal, atraumatic, no cyanosis or edema Pulses: 2+ and symmetric Skin: Skin color, texture, turgor normal. No rashes or lesions Neurologic: Alert and oriented X 3, normal strength and tone. Normal symmetric reflexes. Normal coordination and gait  EKG not performed today  ASSESSMENT AND PLAN:   PVD (peripheral vascular disease) History of peripheral arterial disease status post right common iliac artery stenting which I performed back in 2007.  I re-angiogram him 04/13/2015 and had a patent iliac stent with a 75% ostial left common iliac artery stenosis with a 40 mm gradient.  I ultimately stented this with an 8 mm x 38 mm long I-Cath  covered stent with an excellent result.  Claudication has resolved.  His most recent studies performed 08/14/2017 normal ABIs with patent stents.  Coronary atherosclerosis of native coronary artery History of CAD status post remote RCA stenting with bare-metal stents by myself.  He had recath approximately 5 to 6 years ago at 1 point and had re-intervention because of "in-stent restenosis.  He currently denies chest pain or shortness of breath.  Essential hypertension History of essential hypertension her blood pressure measured today 138/70.  He is on metoprolol and amlodipine.  Continue current meds at current dosing.  Hyperlipidemia History of hyperlipidemia not on statin therapy with lipid profile performed 03/05/2017 revealing total cholesterol 162, LDL 73 and HDL of 59.  Syncope An episode of syncope while at work on 07/06/2018.  He was unconscious for  a short period time.  He felt somewhat lethargic the week after that.  He had a remote episode 5 to 6 years ago at night when he was going to the bathroom.  At this point, I do not think a 30-day event monitor would be fruitful.  If he has recurrent episodes we will discuss loop recorder implantation.      Lorretta Harp MD FACP,FACC,FAHA, [facility] 07/23/2018 4:37 PM

## 2018-07-23 NOTE — Assessment & Plan Note (Signed)
History of peripheral arterial disease status post right common iliac artery stenting which I performed back in 2007.  I re-angiogram him 04/13/2015 and had a patent iliac stent with a 75% ostial left common iliac artery stenosis with a 40 mm gradient.  I ultimately stented this with an 8 mm x 38 mm long I-Cath covered stent with an excellent result.  Claudication has resolved.  His most recent studies performed 08/14/2017 normal ABIs with patent stents.

## 2018-07-23 NOTE — Assessment & Plan Note (Signed)
An episode of syncope while at work on 07/06/2018.  He was unconscious for a short period time.  He felt somewhat lethargic the week after that.  He had a remote episode 5 to 6 years ago at night when he was going to the bathroom.  At this point, I do not think a 30-day event monitor would be fruitful.  If he has recurrent episodes we will discuss loop recorder implantation.

## 2018-07-23 NOTE — Assessment & Plan Note (Signed)
History of essential hypertension her blood pressure measured today 138/70.  He is on metoprolol and amlodipine.  Continue current meds at current dosing.

## 2018-07-23 NOTE — Telephone Encounter (Signed)
Patient has appointment with Dr.Berry 07/23/18 at 3:45.

## 2018-07-23 NOTE — Assessment & Plan Note (Signed)
History of CAD status post remote RCA stenting with bare-metal stents by myself.  He had recath approximately 5 to 6 years ago at 1 point and had re-intervention because of "in-stent restenosis.  He currently denies chest pain or shortness of breath.

## 2018-07-24 ENCOUNTER — Ambulatory Visit (HOSPITAL_COMMUNITY)
Admission: RE | Admit: 2018-07-24 | Discharge: 2018-07-24 | Disposition: A | Discharge status: Home / Self Care | Payer: BLUE CROSS/BLUE SHIELD | Source: Ambulatory Visit | Attending: Pulmonary Disease | Admitting: Pulmonary Disease

## 2018-07-24 DIAGNOSIS — R911 Solitary pulmonary nodule: Secondary | ICD-10-CM | POA: Diagnosis not present

## 2018-07-24 DIAGNOSIS — R918 Other nonspecific abnormal finding of lung field: Secondary | ICD-10-CM | POA: Diagnosis not present

## 2018-07-24 LAB — GLUCOSE, CAPILLARY: GLUCOSE-CAPILLARY: 107 mg/dL — AB (ref 70–99)

## 2018-07-24 MED ORDER — FLUDEOXYGLUCOSE F - 18 (FDG) INJECTION
6.7000 | Freq: Once | INTRAVENOUS | Status: AC
  Administered 2018-07-24: 6.7 via INTRAVENOUS

## 2018-07-30 ENCOUNTER — Encounter: Payer: Self-pay | Admitting: Primary Care

## 2018-07-30 ENCOUNTER — Ambulatory Visit (INDEPENDENT_AMBULATORY_CARE_PROVIDER_SITE_OTHER): Payer: BLUE CROSS/BLUE SHIELD | Admitting: Primary Care

## 2018-07-30 ENCOUNTER — Telehealth: Payer: Self-pay | Admitting: Pulmonary Disease

## 2018-07-30 VITALS — BP 140/84 | HR 63 | Temp 98.5°F | Ht 68.0 in | Wt 128.4 lb

## 2018-07-30 DIAGNOSIS — J449 Chronic obstructive pulmonary disease, unspecified: Secondary | ICD-10-CM | POA: Diagnosis not present

## 2018-07-30 DIAGNOSIS — R05 Cough: Secondary | ICD-10-CM | POA: Diagnosis not present

## 2018-07-30 DIAGNOSIS — R058 Other specified cough: Secondary | ICD-10-CM

## 2018-07-30 DIAGNOSIS — R911 Solitary pulmonary nodule: Secondary | ICD-10-CM

## 2018-07-30 MED ORDER — DOXYCYCLINE HYCLATE 100 MG PO TABS: 100.0000 mg | ORAL_TABLET | Freq: Two times a day (BID) | ORAL | 0 refills | Status: DC

## 2018-07-30 MED ORDER — UMECLIDINIUM-VILANTEROL 62.5-25 MCG/INH IN AEPB: 1.0000 | INHALATION_SPRAY | Freq: Every day | RESPIRATORY_TRACT | 1 refills | Status: DC

## 2018-07-30 NOTE — Assessment & Plan Note (Addendum)
-   PFTs showed mild obstruction - Start Anoro 1 puff daily

## 2018-07-30 NOTE — Telephone Encounter (Signed)
Call made to patient, appointment made to discuss PET scan results per BI. Nothing further is needed at this time.

## 2018-07-30 NOTE — Progress Notes (Addendum)
@Patient  ID: Sean Rose, male    DOB: November 08, 1955, 63 y.o.   MRN: [id number]  Chief Complaint  Patient presents with  . Follow-up    dry cough with SOB, tiredness    Referring provider: [name], NP  HPI: 63 year old male, current everyday smoker (40+pack year hx). PMH significant for pulmonary nodules, COPD, hypertension, coronary artery disease, syncope, GERD, hyperlipidemia, hypomagnesemia.  Patient of Dr. Valeta Harms, seen for initial consult on 12/17 for new right upper lobe pulmonary nodules.   07/30/2018 Patient presents today to review PET scan results and PFTs. He was able to review his results on my chart.  Called office today requesting an appointment to go over recent testing. PET scan showed multiple ill-defined spiculated right upper lobe pulmonary nodules with intense hypermetabolic activity for size. Largest measuring 1.4 x 1.4cm.  Distribution and uptake of these nodules favor an infectious versus amatory etiology of her malignancy. Pulmonary function test showed moderate obstruction.  Complains of dry cough with occasional clear mucus and shortness of breath. Associated fatigue. Describes little appetite with reported 10 pound weight loss over the last few months.  He has cut down tobacco use to 15-17 cigarettes a day. Tried Chantix, patches and gum in the past and states that it did just not stick. Discussed PET scan results favoring inflammatory versus infectious etiology, patient would like to pursue aggressive diagnosis/treatment options. Denies fever, chills, hemoptysis.    Significant testing:  07/08/2018 CT chest- Right upper lobe spiculated lesion, 1.1 x 1.1 x 1.4 cm, additional more posterior upper lobe against the major fissure 2 x 1 x 1 cm nodule with associated groundglass digit in all central advised upper lobe nodule as well.  There is multiple associated areas of significant centrilobular emphysema  PFTs 07/15/18-  FVC 4.81 (109%), FEV1 2.99 (90%), ratio  62  Allergies  Allergen Reactions  . Oxycodone-Acetaminophen Nausea Only  . Tylox [Oxycodone-Acetaminophen] Nausea Only  . Tyloxapol Nausea Only    There is no immunization history for the selected administration types on file for this patient.  Past Medical History:  Diagnosis Date  . Anginal pain ([place])   . CAD (coronary artery disease)   . CAP (community acquired pneumonia) 09/2016  . COPD (chronic obstructive pulmonary disease) ([place])   . GERD (gastroesophageal reflux disease)   . Heart murmur    "I was told I had one when I was a kid"  . Hemorrhoids   . History of anal fissures    "no surgeries" (10/30/2016)  . Hyperlipidemia   . Hypertension   . Peripheral arterial disease ([place])    status post right common iliac artery stenting back in 2007  . Seasonal allergies   . Tobacco abuse     Tobacco History: Social History   Tobacco Use  Smoking Status Current Every Day Smoker  . Packs/day: 1.00  . Years: 38.00  . Pack years: 38.00  . Types: Cigarettes  Smokeless Tobacco Never Used  Tobacco Comment   pack per day 12.17.19   Ready to quit: Not Answered Counseling given: Not Answered Comment: pack per day 12.17.19   Outpatient Medications Prior to Visit  Medication Sig Dispense Refill  . amLODipine (NORVASC) 5 MG tablet TAKE 1 TABLET(5 MG) BY MOUTH DAILY 60 tablet 0  . aspirin EC 81 MG tablet Take 81 mg by mouth daily.    . clopidogrel (PLAVIX) 75 MG tablet TAKE 1 TABLET BY MOUTH EVERY DAY 90 tablet 1  . diphenhydrAMINE (BENADRYL)  25 mg capsule Take 25 mg by mouth every 6 (six) hours as needed for allergies.    Marland Kitchen ibuprofen (ADVIL,MOTRIN) 200 MG tablet Take 200 mg by mouth every 6 (six) hours as needed for headache.    . Magnesium 250 MG TABS Take 1 tablet by mouth daily.    . metoprolol succinate (TOPROL-XL) 100 MG 24 hr tablet TAKE 1 TABLET BY MOUTH EVERY DAY 30 tablet 0  . Multiple Vitamin (MULTIVITAMIN WITH MINERALS) TABS tablet Take 1 tablet by mouth daily.    .  nitroGLYCERIN (NITROSTAT) 0.4 MG SL tablet PLACE 1 TABLET UNDER THE TONGUE EVERY 5 MINUTES AS NEEDED FOR CHEST PAIN 25 tablet 6   No facility-administered medications prior to visit.     Review of Systems  Review of Systems  Constitutional: Positive for appetite change, fatigue and unexpected weight change.  Respiratory: Positive for cough and shortness of breath. Negative for wheezing.     Physical Exam  BP 140/84 (BP Location: Right Arm, Cuff Size: Normal)   Pulse 63   Temp 98.5 F (36.9 C)   Ht 5\' 8"  (1.727 m)   Wt 128 lb 6.4 oz (58.2 kg)   SpO2 98%   BMI 19.52 kg/m  Physical Exam Constitutional:      General: He is not in acute distress.    Appearance: Normal appearance. He is normal weight.  HENT:     Head: Normocephalic and atraumatic.     Right Ear: Tympanic membrane normal.     Left Ear: Tympanic membrane normal.     Nose: Nose normal.     Mouth/Throat:     Mouth: Mucous membranes are moist.     Pharynx: Oropharynx is clear.  Eyes:     Extraocular Movements: Extraocular movements intact.     Pupils: Pupils are equal, round, and reactive to light.  Neck:     Musculoskeletal: Normal range of motion and neck supple.  Cardiovascular:     Rate and Rhythm: Normal rate and regular rhythm.  Pulmonary:     Breath sounds: No stridor. No rhonchi or rales.     Comments: Diminished, possible pleural rub  Musculoskeletal: Normal range of motion.  Skin:    General: Skin is warm and dry.  Neurological:     General: No focal deficit present.     Mental Status: He is alert and oriented to person, place, and time. Mental status is at baseline.  Psychiatric:        Mood and Affect: Mood normal.        Behavior: Behavior normal.        Thought Content: Thought content normal.        Judgment: Judgment normal.      Lab Results:  CBC    Component Value Date/Time   WBC 8.3 07/08/2018 1511   RBC 4.53 07/08/2018 1511   HGB 15.9 07/08/2018 1511   HCT 45.4 07/08/2018  1511   PLT 341 07/08/2018 1511   MCV 100.2 (H) 07/08/2018 1511   MCH 35.1 (H) 07/08/2018 1511   MCHC 35.0 07/08/2018 1511   RDW 13.0 07/08/2018 1511   LYMPHSABS 1.3 05/05/2017 1149   MONOABS 0.6 05/05/2017 1149   EOSABS 0.0 05/05/2017 1149   BASOSABS 0.1 05/05/2017 1149    BMET    Component Value Date/Time   NA 127 (L) 07/08/2018 1511   NA 130 (L) 03/05/2017 1110   K 2.9 (L) 07/08/2018 1511   CL 91 (L) 07/08/2018 1511  CO2 26 07/08/2018 1511   GLUCOSE 108 (H) 07/08/2018 1511   BUN 5 (L) 07/08/2018 1511   BUN 4 (L) 03/05/2017 1110   CREATININE 0.50 (L) 07/08/2018 1511   CREATININE 0.58 (L) 05/22/2015 1041   CALCIUM 9.1 07/08/2018 1511   GFRNONAA >60 07/08/2018 1511   GFRAA >60 07/08/2018 1511    BNP No results found for: BNP  ProBNP No results found for: PROBNP  Imaging: Dg Chest 2 View  Result Date: 07/08/2018 CLINICAL DATA:  The patient a syncopal episode 07/06/2018. Squeezing sensation in the chest today. EXAM: CHEST - 2 VIEW COMPARISON:  None. FINDINGS: A masslike opacity measuring 4.9 cm in diameter is seen in the right lower lung zone on the frontal view. There is also a right upper lobe pulmonary nodule measuring 1.5 cm in diameter. The lungs appear emphysematous. No consolidative process or pneumothorax. Heart size is normal. Atherosclerosis noted. No acute or focal bony abnormality. IMPRESSION: Masslike opacity the right lower lung zone with a pulmonary nodule in the right upper lobe highly suspicious for carcinoma and new since the prior exam. Recommend chest CT with contrast for further evaluation. Emphysema. Atherosclerosis. Electronically Signed   By: Inge Rise M.D.   On: 07/08/2018 15:27   Ct Angio Chest Pe W And/or Wo Contrast  Result Date: 07/08/2018 CLINICAL DATA:  Sternal chest pain and tightness for the past 3 days. History of COPD. Current smoker. Abnormal preceding chest radiograph. Evaluate for pulmonary embolism. EXAM: CT ANGIOGRAPHY CHEST  WITH CONTRAST TECHNIQUE: Multidetector CT imaging of the chest was performed using the standard protocol during bolus administration of intravenous contrast. Multiplanar CT image reconstructions and MIPs were obtained to evaluate the vascular anatomy. CONTRAST:  14mL ISOVUE-370 IOPAMIDOL (ISOVUE-370) INJECTION 76% COMPARISON:  Chest radiograph-07/08/2018; 10/30/2016 FINDINGS: Vascular Findings: There is adequate opacification of the pulmonary arterial system with the main pulmonary artery measuring 454 Hounsfield units. There are no discrete filling defects within the pulmonary arterial tree to suggest pulmonary embolism. Normal caliber of the main pulmonary artery. Borderline cardiomegaly. No pericardial effusion. Coronary artery calcifications. Scattered mixed calcified and noncalcified atherosclerotic plaque with a normal caliber thoracic aorta, not resulting in hemodynamically significant stenosis. Conventional configuration of the aortic arch. The branch vessels of the aortic arch appear widely patent throughout their imaged courses. The descending thoracic aorta is tortuous but of normal caliber. Review of the MIP images confirms the above findings. ---------------------------------------------------------------------------------- Nonvascular Findings: Mediastinum/Lymph Nodes: Right suprahilar lymph node is not enlarged by size criteria measuring 0.7 cm in greatest short axis diameter (image 52, series 4). No bulky mediastinal, hilar axillary lymphadenopathy. Lungs/Pleura: Advanced apical predominant mixed centrilobular and paraseptal emphysematous change. There are several scattered spiculated nodules within the right upper lobe which correlate with the findings seen on preceding chest radiograph. Most worrisome slightly spiculated nodule within the right upper lobe measures approximately 1.1 x 1.1 x 1.4 cm (axial image 35, series 5; coronal image 73, series 7). Slightly less well-defined but somewhat  spiculated nodule within the right upper lobe adjacent to the posterior aspect the right major fissure measures approximately 2.0 x 1.0 x 1.0 cm (axial image 34, series 5; coronal image 106, series 7). Additional scattered pulmonary nodules within the right upper lobe measure approximately 1.4 x 0.9 x 0.9 cm (axial image 29, series 5; coronal image 80, series 7), 1.0 cm (image 31, series 5) and 0.8 cm (image 26, series 5). No additional nodules are seen outside the right upper lobe. No associated bulky mediastinal  or hilar lymphadenopathy. Minimal dependent subpleural ground-glass atelectasis. No discrete focal airspace opacities. No pleural effusion or pneumothorax. The central pulmonary airways appear widely patent. Upper abdomen: Limited early arterial phase evaluation of the upper abdomen demonstrates mild thickening of the left adrenal gland without definitive nodule. Musculoskeletal: No acute or aggressive osseous abnormalities. Mild (approximately 25%) compression deformity involving the superior endplate of the B70 vertebral body without associated fracture line or paraspinal hematoma. Regional soft tissues appear normal. Normal appearance of the thyroid gland. IMPRESSION: 1. No evidence of pulmonary embolism. 2. Scattered right upper lobe pulmonary nodules, the most worrisome of which measures approximately 1.4 cm in diameter, correlate with the nodules seen on preceding chest radiograph. While nonspecific, given patient's underlying advanced emphysematous change, findings are worrisome for synchronous bronchogenic carcinoma. If patient currently has infectious symptoms, potential imaging strategies could include empiric treatment with follow-up chest CT in 4-6 weeks. If patient is currently asymptomatic, further evaluation with PET-CT could be performed as indicated. 3. Emphysema (ICD10-J43.9). 4. Borderline cardiomegaly. 5. Coronary artery calcifications. Aortic Atherosclerosis (ICD10-I70.0).  Electronically Signed   By: [name] M.D.   On: 07/08/2018 17:27   Nm Pet Image Initial (pi) Skull Base To Thigh  Result Date: 07/24/2018 CLINICAL DATA:  Initial treatment strategy for pulmonary nodules. Smoker. EXAM: NUCLEAR MEDICINE PET SKULL BASE TO THIGH TECHNIQUE: 6.7 mCi F-18 FDG was injected intravenously. Full-ring PET imaging was performed from the skull base to thigh after the radiotracer. CT data was obtained and used for attenuation correction and anatomic localization. Fasting blood glucose: 107 mg/dl COMPARISON:  Chest CT on 07/08/2018 FINDINGS: (Background mediastinal blood pool activity: SUV max = 2.1) NECK:  No hypermetabolic lymph nodes or masses. Incidental CT findings:  None. CHEST: No hypermetabolic masses or lymphadenopathy. Moderate centrilobular emphysema again noted. Multiple ill-defined and spiculated nodules in the right upper lobe show intense hypermetabolic activity. Largest index nodule in the right upper lobe measures 1.4 x 1.4 cm on image 21/8, with SUV max of 6.8. No hypermetabolic nodules are seen elsewhere within either lung. No evidence of pleural effusion. Incidental CT findings:  Aortic and coronary artery atherosclerosis. ABDOMEN/PELVIS: No abnormal hypermetabolic activity within the liver, pancreas, adrenal glands, or spleen. No hypermetabolic lymph nodes in the abdomen or pelvis. Incidental CT findings:  None. SKELETON: No focal hypermetabolic bone lesions to suggest skeletal metastasis. Incidental CT findings:  None. IMPRESSION: Multiple ill-defined spiculated right upper lobe pulmonary nodules show intense hypermetabolic activity for size. Intensity of FDG uptake and clustered distribution of these nodules favor an infectious or inflammatory etiology over malignancy. Suggest continued follow-up by chest CT in 2-3 months. No hypermetabolic thoracic lymphadenopathy or distant metastatic disease. Aortic Atherosclerosis (ICD10-I70.0) and Emphysema (ICD10-J43.9).  Coronary artery atherosclerosis. Electronically Signed   By: Earle Gell M.D.   On: 07/24/2018 16:57     Assessment & Plan:   Nodule of upper lobe of right lung - PET scan showed multiple ill-defined spiculated right upper lobe pulmonary nodules with intense hypermetabolic activity for size.  Distribution and uptake of these nodules favor an infectious versus inflamamtory etiology over malignancy. No distant metastatic disease. - Will discuss with Dr. Valeta Harms about possible bronchoscopy for biopsy. He would need to hold plavix for 5-7 days prior to procedure. In the meantime we will treat with a course doxycycline and check sputum culture   Stage 1 mild COPD by GOLD classification ([place]) - PFTs showed mild obstruction - Start Anoro 1 puff daily   Next follow-up with Dr.  Icard scheduled for Jan 14th   Martyn Ehrich, NP 07/30/2018

## 2018-07-30 NOTE — Patient Instructions (Signed)
Testing: Pulmonary functions test showed evidence of obstruction/COPD. Starting daily inhaler called Anoro.   PET scan showed multiple right upper lobe pulmonary nodules, larges measuring 1.4 x 5.9DJ, hypermetabolic activity. Distrubution could represent inflammatory or infectious process. Will treat with course of antibiotics. I will also discuss with Dr. Valeta Harms about setting up a bronchoscopy for possible biopsy.    RX: Start Anoro- 1 puff daily Start doxycyline 1 tab twice daily x 7 days   Recommendations: Smoking cessation strongly recommended  Hold plavix for 5-7 days prior to bronchoscopy (again will discuss with Dr. Valeta Harms)   Steps to Quit Smoking  Smoking tobacco can be harmful to your health and can affect almost every organ in your body. Smoking puts you, and those around you, at risk for developing many serious chronic diseases. Quitting smoking is difficult, but it is one of the best things that you can do for your health. It is never too late to quit. What are the benefits of quitting smoking? When you quit smoking, you lower your risk of developing serious diseases and conditions, such as:  Lung cancer or lung disease, such as COPD.  Heart disease.  Stroke.  Heart attack.  Infertility.  Osteoporosis and bone fractures. Additionally, symptoms such as coughing, wheezing, and shortness of breath may get better when you quit. You may also find that you get sick less often because your body is stronger at fighting off colds and infections. If you are pregnant, quitting smoking can help to reduce your chances of having a baby of low birth weight. How do I get ready to quit? When you decide to quit smoking, create a plan to make sure that you are successful. Before you quit:  Pick a date to quit. Set a date within the next two weeks to give you time to prepare.  Write down the reasons why you are quitting. Keep this list in places where you will see it often, such as on  your bathroom mirror or in your car or wallet.  Identify the people, places, things, and activities that make you want to smoke (triggers) and avoid them. Make sure to take these actions: ? Throw away all cigarettes at home, at work, and in your car. ? Throw away smoking accessories, such as Scientist, research (medical). ? Clean your car and make sure to empty the ashtray. ? Clean your home, including curtains and carpets.  Tell your family, friends, and coworkers that you are quitting. Support from your loved ones can make quitting easier.  Talk with your health care provider about your options for quitting smoking.  Find out what treatment options are covered by your health insurance. What strategies can I use to quit smoking? Talk with your healthcare provider about different strategies to quit smoking. Some strategies include:  Quitting smoking altogether instead of gradually lessening how much you smoke over a period of time. Research shows that quitting "cold [place]" is more successful than gradually quitting.  Attending in-person counseling to help you build problem-solving skills. You are more likely to have success in quitting if you attend several counseling sessions. Even short sessions of 10 minutes can be effective.  Finding resources and support systems that can help you to quit smoking and remain smoke-free after you quit. These resources are most helpful when you use them often. They can include: ? Online chats with a Social worker. ? Telephone quitlines. ? Careers information officer. ? Support groups or group counseling. ? Text messaging programs. ? Mobile  phone applications.  Taking medicines to help you quit smoking. (If you are pregnant or breastfeeding, talk with your health care provider first.) Some medicines contain nicotine and some do not. Both types of medicines help with cravings, but the medicines that include nicotine help to relieve withdrawal symptoms. Your health  care provider may recommend: ? Nicotine patches, gum, or lozenges. ? Nicotine inhalers or sprays. ? Non-nicotine medicine that is taken by mouth. Talk with your health care provider about combining strategies, such as taking medicines while you are also receiving in-person counseling. Using these two strategies together makes you more likely to succeed in quitting than if you used either strategy on its own. If you are pregnant or breastfeeding, talk with your health care provider about finding counseling or other support strategies to quit smoking. Do not take medicine to help you quit smoking unless told to do so by your health care provider. What things can I do to make it easier to quit? Quitting smoking might feel overwhelming at first, but there is a lot that you can do to make it easier. Take these important actions:  Reach out to your family and friends and ask that they support and encourage you during this time. Call telephone quitlines, reach out to support groups, or work with a counselor for support.  Ask people who smoke to avoid smoking around you.  Avoid places that trigger you to smoke, such as bars, parties, or smoke-break areas at work.  Spend time around people who do not smoke.  Lessen stress in your life, because stress can be a smoking trigger for some people. To lessen stress, try: ? Exercising regularly. ? Deep-breathing exercises. ? Yoga. ? Meditating. ? Performing a body scan. This involves closing your eyes, scanning your body from head to toe, and noticing which parts of your body are particularly tense. Purposefully relax the muscles in those areas.  Download or purchase mobile phone or tablet apps (applications) that can help you stick to your quit plan by providing reminders, tips, and encouragement. There are many free apps, such as QuitGuide from the [employer] Office manager for Disease Control and Prevention). You can find other support for quitting smoking (smoking  cessation) through smokefree.gov and other websites. How will I feel when I quit smoking? Within the first 24 hours of quitting smoking, you may start to feel some withdrawal symptoms. These symptoms are usually most noticeable 2-3 days after quitting, but they usually do not last beyond 2-3 weeks. Changes or symptoms that you might experience include:  Mood swings.  Restlessness, anxiety, or irritation.  Difficulty concentrating.  Dizziness.  Strong cravings for sugary foods in addition to nicotine.  Mild weight gain.  Constipation.  Nausea.  Coughing or a sore throat.  Changes in how your medicines work in your body.  A depressed mood.  Difficulty sleeping (insomnia). After the first 2-3 weeks of quitting, you may start to notice more positive results, such as:  Improved sense of smell and taste.  Decreased coughing and sore throat.  Slower heart rate.  Lower blood pressure.  Clearer skin.  The ability to breathe more easily.  Fewer sick days. Quitting smoking is very challenging for most people. Do not get discouraged if you are not successful the first time. Some people need to make many attempts to quit before they achieve long-term success. Do your best to stick to your quit plan, and talk with your health care provider if you have any questions or concerns.  This information is not intended to replace advice given to you by your health care provider. Make sure you discuss any questions you have with your health care provider. Document Released: 07/09/2001 Document Revised: 02/18/2017 Document Reviewed: 11/29/2014 Elsevier Interactive Patient Education  2019 [employer].      Flexible Bronchoscopy  Flexible bronchoscopy is a procedure that is used to examine the passageways in the lungs. During the procedure, a thin, flexible tool with a camera on it (bronchoscope) is passed into the mouth or nose, down through the windpipe (trachea), and into the air tubes  (bronchi) in the lungs. This tool allows your health care provider to look at your lungs from the inside and take testing (diagnostic) samples if needed. Tell a health care provider about:  Any allergies you have.  All medicines you are taking, including vitamins, herbs, eye drops, creams, and over-the-counter medicines.  Any problems you or family members have had with anesthetic medicines.  Any blood disorders you have.  Any surgeries you have had.  Any medical conditions you have.  Whether you are pregnant or may be pregnant. What are the risks? Generally, this is a safe procedure. However, problems may occur, including:  Infection.  Bleeding.  Damage to other structures or organs.  Allergic reactions to medicines.  Collapsed lung (pneumothorax).  Increased need for oxygen or difficulty breathing after the procedure. What happens before the procedure? Medicines Ask your health care provider about:  Changing or stopping your regular medicines. This is especially important if you are taking diabetes medicines or blood thinners.  Taking medicines such as aspirin and ibuprofen. These medicines can thin your blood. Do not take these medicines before your procedure if your health care provider instructs you not to. You may be given antibiotic medicine to help prevent infection. Staying hydrated Follow instructions from your health care provider about hydration, which may include:  Up to 2 hours before the procedure - you may continue to drink clear liquids, such as water, clear fruit juice, black coffee, and plain tea. Eating and drinking Follow instructions from your health care provider about eating and drinking, which may include:  8 hours before the procedure - stop eating heavy meals or foods such as meat, fried foods, or fatty foods.  6 hours before the procedure - stop eating light meals or foods, such as toast or cereal.  6 hours before the procedure - stop  drinking milk or drinks that contain milk.  2 hours before the procedure - stop drinking clear liquids. General instructions  Plan to have someone take you home from the hospital or clinic.  If you will be going home right after the procedure, plan to have someone with you for 24 hours. What happens during the procedure?  To lower your risk of infection: ? Your health care team will wash or sanitize their hands. ? Your skin will be washed with soap.  An IV tube will be inserted into one of your veins.  You will be given a medicine (local anesthetic) to numb your mouth, nose, throat, and voice box (larynx). You may also be given one or more of the following: ? A medicine to help you relax (sedative). ? A medicine to control coughing. ? A medicine to dry up any fluids in your lungs (secretions).  A bronchoscope will be passed into your nose or mouth, and into your lungs. Your health care provider will examine your lungs.  Samples of airway secretions may be collected for  testing.  If abnormal areas are seen in your airways, tissue samples may be removed for examination under a microscope (biopsy).  If tissue samples are needed from the outer parts of the lung, a type of X-ray (fluoroscopy) may be used to guide the bronchoscope to these areas.  If bleeding occurs, you may be given medicine to stop or decrease the bleeding. The procedure may vary among health care providers and hospitals. What happens after the procedure?  Do not drive for 24 hours if you were given a sedative.  Your blood pressure, heart rate, breathing rate, and blood oxygen level will be monitored until the medicines you were given have worn off.  You may have a chest X-ray to check for signs of pneumothorax.  You will not be allowed to eat or drink anything for 2 hours after your procedure.  If a biopsy was taken, it is up to you to get the results of your procedure. Ask your health care provider, or the  department that is doing the procedure, when your results will be ready. Summary  Flexible bronchoscopy is a procedure that allows your health care provider to look closely at your lungs from the inside and take testing (diagnostic) samples if needed.  Risks of flexible bronchoscopy include bleeding, infection, and pneumothorax.  Before a flexible bronchoscopy, you will be given a medicine (local anesthetic) to numb your mouth, nose, throat, and voice box (larynx). Then, a bronchoscope will be passed into your nose or mouth, and into your lungs.  After the procedure, your blood pressure, heart rate, breathing rate, and blood oxygen level will be monitored until the medicines you were given have worn off. You may have a chest X-ray to check for signs of pneumothorax.  You will not be allowed to eat or drink anything for 2 hours after your procedure. This information is not intended to replace advice given to you by your health care provider. Make sure you discuss any questions you have with your health care provider. Document Released: 07/12/2000 Document Revised: 08/17/2016 Document Reviewed: 08/17/2016 Elsevier Interactive Patient Education  2019 [employer].

## 2018-07-30 NOTE — Assessment & Plan Note (Addendum)
-   PET scan showed multiple ill-defined spiculated right upper lobe pulmonary nodules with intense hypermetabolic activity for size.  Distribution and uptake of these nodules favor an infectious versus inflamamtory etiology over malignancy. No distant metastatic disease. - Will discuss with Dr. Valeta Harms about possible bronchoscopy for biopsy. He would need to hold plavix for 5-7 days prior to procedure. In the meantime we will treat with a course doxycycline and check sputum culture

## 2018-07-30 NOTE — Progress Notes (Signed)
PCCM: Thanks for seeing him. I did attempt to call the patient and discuss with him over the phone this evening. There was no answer and I left a VM.  CC: [place], I suspect he will call back to try to get ahold of me.  Garner Nash, DO [facility] 07/30/2018 4:51 PM

## 2018-08-04 ENCOUNTER — Telehealth: Payer: Self-pay | Admitting: Pulmonary Disease

## 2018-08-04 NOTE — Telephone Encounter (Signed)
Called and spoke with Patient.  He stated that he saw Derl Barrow, NP, 07/30/18, and she had told him that she would call him about a possible bronchoscopy.  He is still taking his Plavix, but he would like to move forward aggressively, if possible.   No order in chart, as of this time, for a bronchoscopy.  Did see note from Dr.Icard, where he tried contacting Patient.  The best number to reach Patient is [phone]. He said that he would be available today.     Will route message to Dr. Valeta Harms

## 2018-08-05 NOTE — Telephone Encounter (Signed)
Patient called 08/04/18 to talk with Dr. Valeta Harms about possible bronchoscopy.  Called and spoke with Patient  today. He stated that no one had called him back at this time, but he would be available today.     Will route message to Dr. Valeta Harms and [place] as Juluis Rainier

## 2018-08-05 NOTE — Telephone Encounter (Signed)
PCCM:  I called the patient regarding bronchoscopy plans.  I will see him next week in clinic to discuss further.   Tentative: he needs SuperD CT image on Jan 23rd or 24th if possible at latest the following week.    Possible bronchoscopy on Feb 5th in AM. (this could change pending results of superD)   We will need plavix clearance as well and holding for 5 full days prior.   Garner Nash, DO  Pulmonary Critical Care 08/05/2018 11:56 AM

## 2018-08-05 NOTE — Telephone Encounter (Signed)
Forwarding to [place] per her request

## 2018-08-06 NOTE — Telephone Encounter (Signed)
Message forwarded previously  to [place] to follow up.

## 2018-08-06 NOTE — Telephone Encounter (Signed)
Patient has appointment next. Patient has spoken with Dr. Valeta Harms. Will address at office visit. Nothing further is needed at this time.

## 2018-08-07 ENCOUNTER — Other Ambulatory Visit: Payer: Self-pay | Admitting: Cardiovascular Disease

## 2018-08-07 NOTE — Telephone Encounter (Signed)
Rx request sent to pharmacy.  

## 2018-08-11 ENCOUNTER — Encounter: Payer: Self-pay | Admitting: Pulmonary Disease

## 2018-08-11 ENCOUNTER — Ambulatory Visit (INDEPENDENT_AMBULATORY_CARE_PROVIDER_SITE_OTHER): Payer: BLUE CROSS/BLUE SHIELD | Admitting: Pulmonary Disease

## 2018-08-11 VITALS — BP 128/70 | HR 68 | Ht 68.0 in | Wt 127.2 lb

## 2018-08-11 DIAGNOSIS — F17200 Nicotine dependence, unspecified, uncomplicated: Secondary | ICD-10-CM

## 2018-08-11 DIAGNOSIS — Z7902 Long term (current) use of antithrombotics/antiplatelets: Secondary | ICD-10-CM

## 2018-08-11 DIAGNOSIS — J449 Chronic obstructive pulmonary disease, unspecified: Secondary | ICD-10-CM | POA: Diagnosis not present

## 2018-08-11 DIAGNOSIS — R918 Other nonspecific abnormal finding of lung field: Secondary | ICD-10-CM | POA: Diagnosis not present

## 2018-08-11 DIAGNOSIS — R911 Solitary pulmonary nodule: Secondary | ICD-10-CM

## 2018-08-11 NOTE — H&P (View-Only) (Signed)
Synopsis: Referred in December 2019 for RUL lung nodule by [name], NP  Subjective:   PATIENT ID: Sean Rose GENDER: male DOB: [birth year], MRN: [id number]  Chief Complaint  Patient presents with  . Follow-up    Here to discuss Bronch procedure.     PMH tobacco abuse, COPD, GERD. Recently went to the ED after passing out. He had blood work and a CT chest. CT revealed RUL nodules and the patient was referred here for further. Works in an Marketing executive, manages a Teacher, English as a foreign language supplies. New boarder collie mix in the home. No exotic animals. No recent travel out of the country. Smoker since college, 40+ years, varies 1-1.5ppd. He has attempted to stop in the past. He has used patches, gum and chantix. He had horrible nightmares. He lost 10 to 12 pounds over the past month.  He also has noticed a significant decrease in his appetite.  Patient denies hemoptysis.  Of note he does take Plavix regularly.  He has had cardiac stents as well as femoral stents.  None within the past year.  OV 08/11/2018: pretty stressed out this morning patient's father was taken to [facility], ER this morning and intubated for respiratory failure.  Patient is here today for follow-up of his upper lobe lung nodules.  Since we were last seen he does have cardiology clearance for holding of his Plavix for any planned bronchoscopies.  He had pet imaging which revealed multiple upper lobe PET avid lesions and the reading radiologist was concerned for infectious inflammatory nodules over malignancy.  He has a planned super D image follow-up within the next 2 weeks.  We are here today to review those images and discussed the risk benefits and alternatives of proceeding with bronchoscopy.  Overall he still feels very tired and fatigued.  He has no hemoptysis.  He is trying to stop smoking.  He has been able to decrease this.  Overall the stress of this new information is making it difficult to  quit.   Past Medical History:  Diagnosis Date  . Anginal pain ([place])   . CAD (coronary artery disease)   . CAP (community acquired pneumonia) 09/2016  . COPD (chronic obstructive pulmonary disease) ([place])   . GERD (gastroesophageal reflux disease)   . Heart murmur    "I was told I had one when I was a kid"  . Hemorrhoids   . History of anal fissures    "no surgeries" (10/30/2016)  . Hyperlipidemia   . Hypertension   . Peripheral arterial disease ([place])    status post right common iliac artery stenting back in 2007  . Seasonal allergies   . Tobacco abuse      Family History  Problem Relation Age of Onset  . Colon cancer Mother   . Heart disease Father   . Heart disease Paternal Grandfather      Past Surgical History:  Procedure Laterality Date  . ANKLE SURGERY Left    "rebuilt it"  . ANTERIOR CRUCIATE LIGAMENT REPAIR Right   . CARDIAC CATHETERIZATION  09/23/2008   Continued medical therapy - may need GI evaluation in addition.  Marland Kitchen CARDIAC CATHETERIZATION  10/28/2007   Medical therapy recommended.  Marland Kitchen CARDIAC CATHETERIZATION  11/18/2006   In-stent restenosis RCA  (50% distal edge, 80% segmental mid, and 50-60% segmental proximal). Successful cutting balloon atherectomy using a [id number] cutting balloon. 3 inflations with atherectomy performed on mid and proximal portions resulting in reduction of 80% mid in-stent  restenosis to less than 20% residual and 50-60% segmental proximal to less than 20% residual without dissection.  Marland Kitchen CARDIAC CATHETERIZATION  02/26/2006   Severe stenosis in RCA. Stenting performed using IVUS. 3.5x20 Maverick balloon deployed at [employer]. Distal stent-a 4x28 Liberte stent-deployed 12atm 48sec, 12atm 31sec, 4atm 19sec. Mid stent-a 4x28 Liberte stent-deployed 14atm 45sec, 14atm 60sec, 14atm 44sec. Proximal stent-4x8 Liberte- 14atm 45sec,14atm 47sec, 16atm 43sec. Severely diseased segment then appeared TIMI-3 flow.  Marland Kitchen CARDIOVASCULAR STRESS TEST  11/17/2012   No  significant ECG changes. Septal perfusion defect is new when complared to study from 2010. Abnormal myocardial perfusion imaging with a basal to mid perfusion suggestive of previous MI.  Marland Kitchen CAROTID DOPPLER  08/09/2011   Bilateral Bulb/Proximal ICA - demonstrated a mild amount of fibrous plaque without evidence of significant diameter reduction reduction or other vascular abnormality.  . COLONOSCOPY     2003, 2014  . CORONARY ANGIOPLASTY WITH STENT PLACEMENT    . FEMORAL ARTERY STENT    . INGUINAL HERNIA REPAIR Right   . KNEE ARTHROSCOPY Right "multiple"  . LOWER EXTREMITY ARTERIAL DOPPLER  01/31/2011   Bilateral ABIs-normal values with no suggestion of arterial insuff to the lower extremities at rest. Right CIA stent-mild amount of nonhemodynamically significant plaque is noted throughout  . 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 PERCUTANEOUS STENT INTERVENTION  04/04/2006 & 04/13/2015   a. Right common iliac artery with an 8.0x18 mm Herculink stent deployed at 12 atm. Stenosis was reduced from 80% to 0% with brisk flow. b. I-cast stenting to left common iliac artery  . PERIPHERAL VASCULAR CATHETERIZATION N/A 04/13/2015   Procedure: Lower Extremity Angiography;  Surgeon: Lorretta Harp, MD; L-oCIA 75%, 40-50% L-EIA, R-CIA stent patent, s/p 8 mm x 38 mm ICast covered stent>>0% stenosis in L-oCIA     . SHOULDER ARTHROSCOPY WITH ROTATOR CUFF REPAIR Right   . TRANSTHORACIC ECHOCARDIOGRAM  11/26/2012   EF not noted. Aortic valve-sclerosis without stenosis, no regurgiation.   Marland Kitchen UPPER GASTROINTESTINAL ENDOSCOPY    . US CAROTID DOPPLER BILATERAL ([place] [facility])  08/09/2011   Bilateral Bulb/Proximal ICAa demonstrated a mild amount of fibrous plaque without evidence of significant diameter reduction or any other vascular abnormality.    Social History   Socioeconomic History  . Marital status: Married    Spouse name: Not on file  . Number of children: Not on file  . Years of education: Not on file   . Highest education level: Not on file  Occupational History  . Not on file  Social Needs  . Financial resource strain: Not on file  . Food insecurity:    Worry: Not on file    Inability: Not on file  . Transportation needs:    Medical: Not on file    Non-medical: Not on file  Tobacco Use  . Smoking status: Current Every Day Smoker    Packs/day: 1.00    Years: 38.00    Pack years: 38.00    Types: Cigarettes  . Smokeless tobacco: Never Used  . Tobacco comment: pack per day 12.17.19  Substance and Sexual Activity  . Alcohol use: Yes    Alcohol/week: 36.0 standard drinks    Types: 36 Cans of beer per week    Comment: 10/30/2016 "I usually drink 4-8 beers qd"  . Drug use: No  . Sexual activity: Yes  Lifestyle  . Physical activity:    Days per week: Not on file    Minutes per session:  Not on file  . Stress: Not on file  Relationships  . Social connections:    Talks on phone: Not on file    Gets together: Not on file    Attends religious service: Not on file    Active member of club or organization: Not on file    Attends meetings of clubs or organizations: Not on file    Relationship status: Not on file  . Intimate partner violence:    Fear of current or ex partner: Not on file    Emotionally abused: Not on file    Physically abused: Not on file    Forced sexual activity: Not on file  Other Topics Concern  . Not on file  Social History Narrative  . Not on file     Allergies  Allergen Reactions  . Oxycodone-Acetaminophen Nausea Only  . Tylox [Oxycodone-Acetaminophen] Nausea Only  . Tyloxapol Nausea Only     Outpatient Medications Prior to Visit  Medication Sig Dispense Refill  . amLODipine (NORVASC) 5 MG tablet TAKE 1 TABLET(5 MG) BY MOUTH DAILY 60 tablet 0  . aspirin EC 81 MG tablet Take 81 mg by mouth daily.    . clopidogrel (PLAVIX) 75 MG tablet TAKE 1 TABLET BY MOUTH EVERY DAY 90 tablet 1  . diphenhydrAMINE (BENADRYL) 25 mg capsule Take 25 mg by mouth every  6 (six) hours as needed for allergies.    Marland Kitchen doxycycline (VIBRA-TABS) 100 MG tablet Take 1 tablet (100 mg total) by mouth 2 (two) times daily. 14 tablet 0  . ibuprofen (ADVIL,MOTRIN) 200 MG tablet Take 200 mg by mouth every 6 (six) hours as needed for headache.    . Magnesium 250 MG TABS Take 1 tablet by mouth daily.    . metoprolol succinate (TOPROL-XL) 100 MG 24 hr tablet TAKE 1 TABLET BY MOUTH EVERY DAY 90 tablet 1  . Multiple Vitamin (MULTIVITAMIN WITH MINERALS) TABS tablet Take 1 tablet by mouth daily.    . nitroGLYCERIN (NITROSTAT) 0.4 MG SL tablet PLACE 1 TABLET UNDER THE TONGUE EVERY 5 MINUTES AS NEEDED FOR CHEST PAIN 25 tablet 6  . umeclidinium-vilanterol (ANORO ELLIPTA) 62.5-25 MCG/INH AEPB Inhale 1 puff into the lungs daily. 60 each 1   No facility-administered medications prior to visit.     Review of Systems  Constitutional: Positive for malaise/fatigue and weight loss. Negative for chills and fever.  HENT: Negative for hearing loss, sore throat and tinnitus.   Eyes: Negative for blurred vision and double vision.  Respiratory: Negative for cough, hemoptysis, sputum production, shortness of breath, wheezing and stridor.   Cardiovascular: Negative for chest pain, palpitations, orthopnea, leg swelling and PND.  Gastrointestinal: Negative for abdominal pain, constipation, diarrhea, heartburn, nausea and vomiting.  Genitourinary: Negative for dysuria, hematuria and urgency.  Musculoskeletal: Negative for joint pain and myalgias.  Skin: Negative for itching and rash.  Neurological: Negative for dizziness, tingling, weakness and headaches.  Endo/Heme/Allergies: Negative for environmental allergies. Does not bruise/bleed easily.  Psychiatric/Behavioral: Negative for depression. The patient is nervous/anxious. The patient does not have insomnia.   All other systems reviewed and are negative.    Objective:  Physical Exam Vitals signs reviewed.  Constitutional:      General: He is  not in acute distress.    Appearance: He is well-developed.  HENT:     Head: Normocephalic and atraumatic.     Comments: Temporalis muscle wasting Eyes:     General: No scleral icterus.    Conjunctiva/sclera: Conjunctivae normal.  Pupils: Pupils are equal, round, and reactive to light.  Neck:     Musculoskeletal: Neck supple.     Vascular: No JVD.     Trachea: No tracheal deviation.  Cardiovascular:     Rate and Rhythm: Normal rate and regular rhythm.     Heart sounds: Normal heart sounds. No murmur.  Pulmonary:     Effort: Pulmonary effort is normal. No tachypnea, accessory muscle usage or respiratory distress.     Breath sounds: Normal breath sounds. No stridor. No wheezing, rhonchi or rales.  Abdominal:     General: Bowel sounds are normal. There is no distension.     Palpations: Abdomen is soft.     Tenderness: There is no abdominal tenderness.  Musculoskeletal:        General: No tenderness.     Comments: Loss of supra clavicular fat pad  Lymphadenopathy:     Cervical: No cervical adenopathy.  Skin:    General: Skin is warm and dry.     Capillary Refill: Capillary refill takes less than 2 seconds.     Findings: No rash.  Neurological:     Mental Status: He is alert and oriented to person, place, and time.  Psychiatric:        Behavior: Behavior normal.      Vitals:   08/11/18 0853  BP: 128/70  Pulse: 68  SpO2: 99%  Weight: 127 lb 3.2 oz (57.7 kg)  Height: '5\' 8"'  (1.727 m)   99% on RA BMI Readings from Last 3 Encounters:  08/11/18 19.34 kg/m  07/30/18 19.52 kg/m  07/23/18 19.61 kg/m   Wt Readings from Last 3 Encounters:  08/11/18 127 lb 3.2 oz (57.7 kg)  07/30/18 128 lb 6.4 oz (58.2 kg)  07/23/18 129 lb (58.5 kg)     CBC    Component Value Date/Time   WBC 8.3 07/08/2018 1511   RBC 4.53 07/08/2018 1511   HGB 15.9 07/08/2018 1511   HCT 45.4 07/08/2018 1511   PLT 341 07/08/2018 1511   MCV 100.2 (H) 07/08/2018 1511   MCH 35.1 (H) 07/08/2018  1511   MCHC 35.0 07/08/2018 1511   RDW 13.0 07/08/2018 1511   LYMPHSABS 1.3 05/05/2017 1149   MONOABS 0.6 05/05/2017 1149   EOSABS 0.0 05/05/2017 1149   BASOSABS 0.1 05/05/2017 1149    Chest Imaging:  07/08/2018: Right upper lobe spiculated lesion, 1.1 x 1.1 x 1.4 cm, additional more posterior upper lobe against the major fissure 2 x 1 x 1 cm nodule with associated groundglass digit in all central advised upper lobe nodule as well.  There is multiple associated areas of significant centrilobular emphysema.  07/24/2018: Nuclear medicine pet imaging CT 3 upper lobe nodules all PET avid.  Reading radiologist concern for infectious over malignancy. The patient's images have been independently reviewed by me.    Pulmonary Functions Testing Results: PFT Results Latest Ref Rng & Units 07/15/2018  FVC-Pre L 3.27  FVC-Predicted Pre % 74  FVC-Post L 4.81  FVC-Predicted Post % 109  Pre FEV1/FVC % % 90  Post FEV1/FCV % % 62  FEV1-Pre L 2.95  FEV1-Predicted Pre % 89  FEV1-Post L 2.99  DLCO UNC% % 62  DLCO COR %Predicted % 59  TLC L 7.58  TLC % Predicted % 114  RV % Predicted % 149    FeNO: None   Pathology: None   Echocardiogram: None   Heart Catheterization: None     Assessment & Plan:   Lung nodules -  Plan: Ambulatory referral to Pulmonology, CT Super D Chest Wo Contrast  Nodule of upper lobe of right lung  Stage 1 mild COPD by GOLD classification ([place])  Antiplatelet or antithrombotic long-term use  Current smoker  Discussion:  This is a 63 year old gentleman with upper lobe multiple right lung nodules.  Follow-up pet imaging reveal 3 PET avid regions within the right upper lobe.  Today we discussed the risk of this being a malignancy versus inflammatory infectious etiology.  We have settled with the following plan for continued evaluation and surveillance.  We will complete a super D CT image to be done at the end of January.  If the lesion still remained present or  there is any change in enlargement we will immediately plan for super D navigational bronchoscopy and radial endobronchial ultrasound to be completed at the [facility], [place].  We will tentatively schedule his bronchoscopy for February 5.  No matter the case if there is either of the 3 lesions persistent at this point from the beginning of December we will need to proceed with sampling.  Due to the multiple locality within the upper lobe navigational bronchoscopy will be the best choice at obtaining samples.  Today we discussed the risk versus benefits versus alternatives of proceeding with navigational bronchoscopy including radial endobronchial ultrasound versus a percutaneous approach being referred to interventional radiology.  The patient is agreeable to proceed with potential for need of planned bronchoscopy as described above.  Since his last visit we have obtained a cardiology clearance for holding of the patient's Plavix.  The patient will need to hold the Plavix a minimum of 7 full days prior to the procedure.  Greater than 50% of this patient's 40-minute office was spent face-to-face discussing the above recommendations and treatment plan.    Current Outpatient Medications:  .  amLODipine (NORVASC) 5 MG tablet, TAKE 1 TABLET(5 MG) BY MOUTH DAILY, Disp: 60 tablet, Rfl: 0 .  aspirin EC 81 MG tablet, Take 81 mg by mouth daily., Disp: , Rfl:  .  clopidogrel (PLAVIX) 75 MG tablet, TAKE 1 TABLET BY MOUTH EVERY DAY, Disp: 90 tablet, Rfl: 1 .  diphenhydrAMINE (BENADRYL) 25 mg capsule, Take 25 mg by mouth every 6 (six) hours as needed for allergies., Disp: , Rfl:  .  doxycycline (VIBRA-TABS) 100 MG tablet, Take 1 tablet (100 mg total) by mouth 2 (two) times daily., Disp: 14 tablet, Rfl: 0 .  ibuprofen (ADVIL,MOTRIN) 200 MG tablet, Take 200 mg by mouth every 6 (six) hours as needed for headache., Disp: , Rfl:  .  Magnesium 250 MG TABS, Take 1 tablet by mouth daily., Disp: , Rfl:  .  metoprolol  succinate (TOPROL-XL) 100 MG 24 hr tablet, TAKE 1 TABLET BY MOUTH EVERY DAY, Disp: 90 tablet, Rfl: 1 .  Multiple Vitamin (MULTIVITAMIN WITH MINERALS) TABS tablet, Take 1 tablet by mouth daily., Disp: , Rfl:  .  nitroGLYCERIN (NITROSTAT) 0.4 MG SL tablet, PLACE 1 TABLET UNDER THE TONGUE EVERY 5 MINUTES AS NEEDED FOR CHEST PAIN, Disp: 25 tablet, Rfl: 6 .  umeclidinium-vilanterol (ANORO ELLIPTA) 62.5-25 MCG/INH AEPB, Inhale 1 puff into the lungs daily., Disp: 60 each, Rfl: [place], DO [facility] 08/11/2018 9:23 AM

## 2018-08-11 NOTE — Progress Notes (Signed)
Synopsis: Referred in December 2019 for RUL lung nodule by [name], NP  Subjective:   PATIENT ID: Sean Rose GENDER: male DOB: November 03, 1955, MRN: [id number]  Chief Complaint  Patient presents with  . Follow-up    Here to discuss Bronch procedure.     PMH tobacco abuse, COPD, GERD. Recently went to the ED after passing out. He had blood work and a CT chest. CT revealed RUL nodules and the patient was referred here for further. Works in an Marketing executive, manages a Teacher, English as a foreign language supplies. New boarder collie mix in the home. No exotic animals. No recent travel out of the country. Smoker since college, 40+ years, varies 1-1.5ppd. He has attempted to stop in the past. He has used patches, gum and chantix. He had horrible nightmares. He lost 10 to 12 pounds over the past month.  He also has noticed a significant decrease in his appetite.  Patient denies hemoptysis.  Of note he does take Plavix regularly.  He has had cardiac stents as well as femoral stents.  None within the past year.  OV 08/11/2018: pretty stressed out this morning patient's father was taken to [facility], ER this morning and intubated for respiratory failure.  Patient is here today for follow-up of his upper lobe lung nodules.  Since we were last seen he does have cardiology clearance for holding of his Plavix for any planned bronchoscopies.  He had pet imaging which revealed multiple upper lobe PET avid lesions and the reading radiologist was concerned for infectious inflammatory nodules over malignancy.  He has a planned super D image follow-up within the next 2 weeks.  We are here today to review those images and discussed the risk benefits and alternatives of proceeding with bronchoscopy.  Overall he still feels very tired and fatigued.  He has no hemoptysis.  He is trying to stop smoking.  He has been able to decrease this.  Overall the stress of this new information is making it difficult to  quit.   Past Medical History:  Diagnosis Date  . Anginal pain ([place])   . CAD (coronary artery disease)   . CAP (community acquired pneumonia) 09/2016  . COPD (chronic obstructive pulmonary disease) ([place])   . GERD (gastroesophageal reflux disease)   . Heart murmur    "I was told I had one when I was a kid"  . Hemorrhoids   . History of anal fissures    "no surgeries" (10/30/2016)  . Hyperlipidemia   . Hypertension   . Peripheral arterial disease ([place])    status post right common iliac artery stenting back in 2007  . Seasonal allergies   . Tobacco abuse      Family History  Problem Relation Age of Onset  . Colon cancer Mother   . Heart disease Father   . Heart disease Paternal Grandfather      Past Surgical History:  Procedure Laterality Date  . ANKLE SURGERY Left    "rebuilt it"  . ANTERIOR CRUCIATE LIGAMENT REPAIR Right   . CARDIAC CATHETERIZATION  09/23/2008   Continued medical therapy - may need GI evaluation in addition.  Marland Kitchen CARDIAC CATHETERIZATION  10/28/2007   Medical therapy recommended.  Marland Kitchen CARDIAC CATHETERIZATION  11/18/2006   In-stent restenosis RCA  (50% distal edge, 80% segmental mid, and 50-60% segmental proximal). Successful cutting balloon atherectomy using a [id number] cutting balloon. 3 inflations with atherectomy performed on mid and proximal portions resulting in reduction of 80% mid in-stent  restenosis to less than 20% residual and 50-60% segmental proximal to less than 20% residual without dissection.  Marland Kitchen CARDIAC CATHETERIZATION  02/26/2006   Severe stenosis in RCA. Stenting performed using IVUS. 3.5x20 Maverick balloon deployed at [employer]. Distal stent-a 4x28 Liberte stent-deployed 12atm 48sec, 12atm 31sec, 4atm 19sec. Mid stent-a 4x28 Liberte stent-deployed 14atm 45sec, 14atm 60sec, 14atm 44sec. Proximal stent-4x8 Liberte- 14atm 45sec,14atm 47sec, 16atm 43sec. Severely diseased segment then appeared TIMI-3 flow.  Marland Kitchen CARDIOVASCULAR STRESS TEST  11/17/2012   No  significant ECG changes. Septal perfusion defect is new when complared to study from 2010. Abnormal myocardial perfusion imaging with a basal to mid perfusion suggestive of previous MI.  Marland Kitchen CAROTID DOPPLER  08/09/2011   Bilateral Bulb/Proximal ICA - demonstrated a mild amount of fibrous plaque without evidence of significant diameter reduction reduction or other vascular abnormality.  . COLONOSCOPY     2003, 2014  . CORONARY ANGIOPLASTY WITH STENT PLACEMENT    . FEMORAL ARTERY STENT    . INGUINAL HERNIA REPAIR Right   . KNEE ARTHROSCOPY Right "multiple"  . LOWER EXTREMITY ARTERIAL DOPPLER  01/31/2011   Bilateral ABIs-normal values with no suggestion of arterial insuff to the lower extremities at rest. Right CIA stent-mild amount of nonhemodynamically significant plaque is noted throughout  . 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 PERCUTANEOUS STENT INTERVENTION  04/04/2006 & 04/13/2015   a. Right common iliac artery with an 8.0x18 mm Herculink stent deployed at 12 atm. Stenosis was reduced from 80% to 0% with brisk flow. b. I-cast stenting to left common iliac artery  . PERIPHERAL VASCULAR CATHETERIZATION N/A 04/13/2015   Procedure: Lower Extremity Angiography;  Surgeon: Lorretta Harp, MD; L-oCIA 75%, 40-50% L-EIA, R-CIA stent patent, s/p 8 mm x 38 mm ICast covered stent>>0% stenosis in L-oCIA     . SHOULDER ARTHROSCOPY WITH ROTATOR CUFF REPAIR Right   . TRANSTHORACIC ECHOCARDIOGRAM  11/26/2012   EF not noted. Aortic valve-sclerosis without stenosis, no regurgiation.   Marland Kitchen UPPER GASTROINTESTINAL ENDOSCOPY    . US CAROTID DOPPLER BILATERAL ([place] [facility])  08/09/2011   Bilateral Bulb/Proximal ICAa demonstrated a mild amount of fibrous plaque without evidence of significant diameter reduction or any other vascular abnormality.    Social History   Socioeconomic History  . Marital status: Married    Spouse name: Not on file  . Number of children: Not on file  . Years of education: Not on file   . Highest education level: Not on file  Occupational History  . Not on file  Social Needs  . Financial resource strain: Not on file  . Food insecurity:    Worry: Not on file    Inability: Not on file  . Transportation needs:    Medical: Not on file    Non-medical: Not on file  Tobacco Use  . Smoking status: Current Every Day Smoker    Packs/day: 1.00    Years: 38.00    Pack years: 38.00    Types: Cigarettes  . Smokeless tobacco: Never Used  . Tobacco comment: pack per day 12.17.19  Substance and Sexual Activity  . Alcohol use: Yes    Alcohol/week: 36.0 standard drinks    Types: 36 Cans of beer per week    Comment: 10/30/2016 "I usually drink 4-8 beers qd"  . Drug use: No  . Sexual activity: Yes  Lifestyle  . Physical activity:    Days per week: Not on file    Minutes per session:  Not on file  . Stress: Not on file  Relationships  . Social connections:    Talks on phone: Not on file    Gets together: Not on file    Attends religious service: Not on file    Active member of club or organization: Not on file    Attends meetings of clubs or organizations: Not on file    Relationship status: Not on file  . Intimate partner violence:    Fear of current or ex partner: Not on file    Emotionally abused: Not on file    Physically abused: Not on file    Forced sexual activity: Not on file  Other Topics Concern  . Not on file  Social History Narrative  . Not on file     Allergies  Allergen Reactions  . Oxycodone-Acetaminophen Nausea Only  . Tylox [Oxycodone-Acetaminophen] Nausea Only  . Tyloxapol Nausea Only     Outpatient Medications Prior to Visit  Medication Sig Dispense Refill  . amLODipine (NORVASC) 5 MG tablet TAKE 1 TABLET(5 MG) BY MOUTH DAILY 60 tablet 0  . aspirin EC 81 MG tablet Take 81 mg by mouth daily.    . clopidogrel (PLAVIX) 75 MG tablet TAKE 1 TABLET BY MOUTH EVERY DAY 90 tablet 1  . diphenhydrAMINE (BENADRYL) 25 mg capsule Take 25 mg by mouth every  6 (six) hours as needed for allergies.    Marland Kitchen doxycycline (VIBRA-TABS) 100 MG tablet Take 1 tablet (100 mg total) by mouth 2 (two) times daily. 14 tablet 0  . ibuprofen (ADVIL,MOTRIN) 200 MG tablet Take 200 mg by mouth every 6 (six) hours as needed for headache.    . Magnesium 250 MG TABS Take 1 tablet by mouth daily.    . metoprolol succinate (TOPROL-XL) 100 MG 24 hr tablet TAKE 1 TABLET BY MOUTH EVERY DAY 90 tablet 1  . Multiple Vitamin (MULTIVITAMIN WITH MINERALS) TABS tablet Take 1 tablet by mouth daily.    . nitroGLYCERIN (NITROSTAT) 0.4 MG SL tablet PLACE 1 TABLET UNDER THE TONGUE EVERY 5 MINUTES AS NEEDED FOR CHEST PAIN 25 tablet 6  . umeclidinium-vilanterol (ANORO ELLIPTA) 62.5-25 MCG/INH AEPB Inhale 1 puff into the lungs daily. 60 each 1   No facility-administered medications prior to visit.     Review of Systems  Constitutional: Positive for malaise/fatigue and weight loss. Negative for chills and fever.  HENT: Negative for hearing loss, sore throat and tinnitus.   Eyes: Negative for blurred vision and double vision.  Respiratory: Negative for cough, hemoptysis, sputum production, shortness of breath, wheezing and stridor.   Cardiovascular: Negative for chest pain, palpitations, orthopnea, leg swelling and PND.  Gastrointestinal: Negative for abdominal pain, constipation, diarrhea, heartburn, nausea and vomiting.  Genitourinary: Negative for dysuria, hematuria and urgency.  Musculoskeletal: Negative for joint pain and myalgias.  Skin: Negative for itching and rash.  Neurological: Negative for dizziness, tingling, weakness and headaches.  Endo/Heme/Allergies: Negative for environmental allergies. Does not bruise/bleed easily.  Psychiatric/Behavioral: Negative for depression. The patient is nervous/anxious. The patient does not have insomnia.   All other systems reviewed and are negative.    Objective:  Physical Exam Vitals signs reviewed.  Constitutional:      General: He is  not in acute distress.    Appearance: He is well-developed.  HENT:     Head: Normocephalic and atraumatic.     Comments: Temporalis muscle wasting Eyes:     General: No scleral icterus.    Conjunctiva/sclera: Conjunctivae normal.  Pupils: Pupils are equal, round, and reactive to light.  Neck:     Musculoskeletal: Neck supple.     Vascular: No JVD.     Trachea: No tracheal deviation.  Cardiovascular:     Rate and Rhythm: Normal rate and regular rhythm.     Heart sounds: Normal heart sounds. No murmur.  Pulmonary:     Effort: Pulmonary effort is normal. No tachypnea, accessory muscle usage or respiratory distress.     Breath sounds: Normal breath sounds. No stridor. No wheezing, rhonchi or rales.  Abdominal:     General: Bowel sounds are normal. There is no distension.     Palpations: Abdomen is soft.     Tenderness: There is no abdominal tenderness.  Musculoskeletal:        General: No tenderness.     Comments: Loss of supra clavicular fat pad  Lymphadenopathy:     Cervical: No cervical adenopathy.  Skin:    General: Skin is warm and dry.     Capillary Refill: Capillary refill takes less than 2 seconds.     Findings: No rash.  Neurological:     Mental Status: He is alert and oriented to person, place, and time.  Psychiatric:        Behavior: Behavior normal.      Vitals:   08/11/18 0853  BP: 128/70  Pulse: 68  SpO2: 99%  Weight: 127 lb 3.2 oz (57.7 kg)  Height: '5\' 8"'  (1.727 m)   99% on RA BMI Readings from Last 3 Encounters:  08/11/18 19.34 kg/m  07/30/18 19.52 kg/m  07/23/18 19.61 kg/m   Wt Readings from Last 3 Encounters:  08/11/18 127 lb 3.2 oz (57.7 kg)  07/30/18 128 lb 6.4 oz (58.2 kg)  07/23/18 129 lb (58.5 kg)     CBC    Component Value Date/Time   WBC 8.3 07/08/2018 1511   RBC 4.53 07/08/2018 1511   HGB 15.9 07/08/2018 1511   HCT 45.4 07/08/2018 1511   PLT 341 07/08/2018 1511   MCV 100.2 (H) 07/08/2018 1511   MCH 35.1 (H) 07/08/2018  1511   MCHC 35.0 07/08/2018 1511   RDW 13.0 07/08/2018 1511   LYMPHSABS 1.3 05/05/2017 1149   MONOABS 0.6 05/05/2017 1149   EOSABS 0.0 05/05/2017 1149   BASOSABS 0.1 05/05/2017 1149    Chest Imaging:  07/08/2018: Right upper lobe spiculated lesion, 1.1 x 1.1 x 1.4 cm, additional more posterior upper lobe against the major fissure 2 x 1 x 1 cm nodule with associated groundglass digit in all central advised upper lobe nodule as well.  There is multiple associated areas of significant centrilobular emphysema.  07/24/2018: Nuclear medicine pet imaging CT 3 upper lobe nodules all PET avid.  Reading radiologist concern for infectious over malignancy. The patient's images have been independently reviewed by me.    Pulmonary Functions Testing Results: PFT Results Latest Ref Rng & Units 07/15/2018  FVC-Pre L 3.27  FVC-Predicted Pre % 74  FVC-Post L 4.81  FVC-Predicted Post % 109  Pre FEV1/FVC % % 90  Post FEV1/FCV % % 62  FEV1-Pre L 2.95  FEV1-Predicted Pre % 89  FEV1-Post L 2.99  DLCO UNC% % 62  DLCO COR %Predicted % 59  TLC L 7.58  TLC % Predicted % 114  RV % Predicted % 149    FeNO: None   Pathology: None   Echocardiogram: None   Heart Catheterization: None     Assessment & Plan:   Lung nodules -  Plan: Ambulatory referral to Pulmonology, CT Super D Chest Wo Contrast  Nodule of upper lobe of right lung  Stage 1 mild COPD by GOLD classification ([place])  Antiplatelet or antithrombotic long-term use  Current smoker  Discussion:  This is a 63 year old gentleman with upper lobe multiple right lung nodules.  Follow-up pet imaging reveal 3 PET avid regions within the right upper lobe.  Today we discussed the risk of this being a malignancy versus inflammatory infectious etiology.  We have settled with the following plan for continued evaluation and surveillance.  We will complete a super D CT image to be done at the end of January.  If the lesion still remained present or  there is any change in enlargement we will immediately plan for super D navigational bronchoscopy and radial endobronchial ultrasound to be completed at the [facility], [place].  We will tentatively schedule his bronchoscopy for February 5.  No matter the case if there is either of the 3 lesions persistent at this point from the beginning of December we will need to proceed with sampling.  Due to the multiple locality within the upper lobe navigational bronchoscopy will be the best choice at obtaining samples.  Today we discussed the risk versus benefits versus alternatives of proceeding with navigational bronchoscopy including radial endobronchial ultrasound versus a percutaneous approach being referred to interventional radiology.  The patient is agreeable to proceed with potential for need of planned bronchoscopy as described above.  Since his last visit we have obtained a cardiology clearance for holding of the patient's Plavix.  The patient will need to hold the Plavix a minimum of 7 full days prior to the procedure.  Greater than 50% of this patient's 40-minute office was spent face-to-face discussing the above recommendations and treatment plan.    Current Outpatient Medications:  .  amLODipine (NORVASC) 5 MG tablet, TAKE 1 TABLET(5 MG) BY MOUTH DAILY, Disp: 60 tablet, Rfl: 0 .  aspirin EC 81 MG tablet, Take 81 mg by mouth daily., Disp: , Rfl:  .  clopidogrel (PLAVIX) 75 MG tablet, TAKE 1 TABLET BY MOUTH EVERY DAY, Disp: 90 tablet, Rfl: 1 .  diphenhydrAMINE (BENADRYL) 25 mg capsule, Take 25 mg by mouth every 6 (six) hours as needed for allergies., Disp: , Rfl:  .  doxycycline (VIBRA-TABS) 100 MG tablet, Take 1 tablet (100 mg total) by mouth 2 (two) times daily., Disp: 14 tablet, Rfl: 0 .  ibuprofen (ADVIL,MOTRIN) 200 MG tablet, Take 200 mg by mouth every 6 (six) hours as needed for headache., Disp: , Rfl:  .  Magnesium 250 MG TABS, Take 1 tablet by mouth daily., Disp: , Rfl:  .  metoprolol  succinate (TOPROL-XL) 100 MG 24 hr tablet, TAKE 1 TABLET BY MOUTH EVERY DAY, Disp: 90 tablet, Rfl: 1 .  Multiple Vitamin (MULTIVITAMIN WITH MINERALS) TABS tablet, Take 1 tablet by mouth daily., Disp: , Rfl:  .  nitroGLYCERIN (NITROSTAT) 0.4 MG SL tablet, PLACE 1 TABLET UNDER THE TONGUE EVERY 5 MINUTES AS NEEDED FOR CHEST PAIN, Disp: 25 tablet, Rfl: 6 .  umeclidinium-vilanterol (ANORO ELLIPTA) 62.5-25 MCG/INH AEPB, Inhale 1 puff into the lungs daily., Disp: 60 each, Rfl: [place], DO [facility] 08/11/2018 9:23 AM

## 2018-08-11 NOTE — Patient Instructions (Addendum)
Thank you for visiting Dr. Valeta Harms at [facility]. Today we recommend the following: Orders Placed This Encounter  Procedures  . CT Super D Chest Wo Contrast  . Ambulatory referral to Pulmonology   As of Wednesday January 25th STOP taking your PLAVIX.   Return in about 4 weeks (around 09/08/2018).

## 2018-08-14 ENCOUNTER — Telehealth: Payer: Self-pay | Admitting: Pulmonary Disease

## 2018-08-14 ENCOUNTER — Other Ambulatory Visit: Payer: Self-pay

## 2018-08-14 MED ORDER — UMECLIDINIUM-VILANTEROL 62.5-25 MCG/INH IN AEPB: 1.0000 | INHALATION_SPRAY | Freq: Every day | RESPIRATORY_TRACT | 3 refills | Status: DC

## 2018-08-14 NOTE — Telephone Encounter (Signed)
Refill was sent. Left message informing patient refill has been sent to pharmacy. Nothing further is needed at this time.

## 2018-08-14 NOTE — Telephone Encounter (Signed)
PCCM:  Anoro refills sent to [facility] 08/14/2018 12:45 PM

## 2018-08-14 NOTE — Telephone Encounter (Signed)
-----   Message from Joellen Jersey sent at 08/14/2018  9:42 AM EST ----- Pt has been waiting on a pres to  his pharmacy for anoro will run out tomorrow he uses [email or web address] call him [phone]

## 2018-08-17 ENCOUNTER — Ambulatory Visit (HOSPITAL_BASED_OUTPATIENT_CLINIC_OR_DEPARTMENT_OTHER)
Admission: RE | Admit: 2018-08-17 | Discharge: 2018-08-17 | Disposition: A | Discharge status: Home / Self Care | Payer: BLUE CROSS/BLUE SHIELD | Source: Ambulatory Visit | Attending: Cardiovascular Disease | Admitting: Cardiovascular Disease

## 2018-08-17 ENCOUNTER — Ambulatory Visit (HOSPITAL_COMMUNITY)
Admission: RE | Admit: 2018-08-17 | Discharge: 2018-08-17 | Disposition: A | Discharge status: Home / Self Care | Payer: BLUE CROSS/BLUE SHIELD | Source: Ambulatory Visit | Attending: Internal Medicine | Admitting: Internal Medicine

## 2018-08-17 DIAGNOSIS — I739 Peripheral vascular disease, unspecified: Secondary | ICD-10-CM

## 2018-08-20 ENCOUNTER — Ambulatory Visit (INDEPENDENT_AMBULATORY_CARE_PROVIDER_SITE_OTHER)
Admission: RE | Admit: 2018-08-20 | Discharge: 2018-08-20 | Disposition: A | Discharge status: Home / Self Care | Payer: BLUE CROSS/BLUE SHIELD | Source: Ambulatory Visit | Attending: Pulmonary Disease | Admitting: Pulmonary Disease

## 2018-08-20 DIAGNOSIS — R918 Other nonspecific abnormal finding of lung field: Secondary | ICD-10-CM

## 2018-08-21 ENCOUNTER — Telehealth: Payer: Self-pay | Admitting: Pulmonary Disease

## 2018-08-21 NOTE — Telephone Encounter (Signed)
PCCM:  I called and spoke with the patient regarding his CT imaging results.   [facility] 08/21/2018 6:50 PM

## 2018-08-21 NOTE — Telephone Encounter (Signed)
Will route message to Dr. Valeta Harms so that he may call the pt.

## 2018-08-24 ENCOUNTER — Telehealth: Payer: Self-pay | Admitting: Pulmonary Disease

## 2018-08-24 DIAGNOSIS — R918 Other nonspecific abnormal finding of lung field: Secondary | ICD-10-CM

## 2018-08-24 NOTE — Telephone Encounter (Signed)
I have spoken with [place], LPN and she advised that the pt would need a CBC with diff, CMP, PT/INR and PTT. These orders have been placed under Eric Form as Dr. Valeta Harms is on vacation.  Sarah - please sign orders for pt's pre-admit bronch appointment. Thanks.

## 2018-08-24 NOTE — Telephone Encounter (Signed)
Per Dr. Valeta Harms - I called and spoke with the patient regarding his CT imaging results.  08/21/18 Nothing further at this time

## 2018-08-25 ENCOUNTER — Telehealth: Payer: Self-pay

## 2018-08-25 NOTE — Telephone Encounter (Signed)
LMTCB.   Please remind patient to be sure to stop his plavix starting tomorrow 08/26/2018 for his Bronch next week.

## 2018-08-25 NOTE — Telephone Encounter (Signed)
Patient returned phone call, reminder given to  make aware to stop taking his plavix as of 08/26/2018. Voiced understanding. Nothing further is needed at this time.

## 2018-08-26 ENCOUNTER — Encounter (HOSPITAL_COMMUNITY)
Admission: RE | Admit: 2018-08-26 | Discharge: 2018-08-26 | Disposition: A | Discharge status: Home / Self Care | Payer: BLUE CROSS/BLUE SHIELD | Source: Ambulatory Visit | Attending: Pulmonary Disease | Admitting: Pulmonary Disease

## 2018-08-26 ENCOUNTER — Other Ambulatory Visit: Payer: Self-pay

## 2018-08-26 ENCOUNTER — Encounter (HOSPITAL_COMMUNITY): Payer: Self-pay

## 2018-08-26 DIAGNOSIS — Z01812 Encounter for preprocedural laboratory examination: Secondary | ICD-10-CM | POA: Diagnosis not present

## 2018-08-26 LAB — CBC
HCT: 41.5 % (ref 39.0–52.0)
Hemoglobin: 14.7 g/dL (ref 13.0–17.0)
MCH: 35.2 pg — AB (ref 26.0–34.0)
MCHC: 35.4 g/dL (ref 30.0–36.0)
MCV: 99.3 fL (ref 80.0–100.0)
Platelets: 281 10*3/uL (ref 150–400)
RBC: 4.18 MIL/uL — ABNORMAL LOW (ref 4.22–5.81)
RDW: 12.2 % (ref 11.5–15.5)
WBC: 8.9 10*3/uL (ref 4.0–10.5)
nRBC: 0 % (ref 0.0–0.2)

## 2018-08-26 LAB — COMPREHENSIVE METABOLIC PANEL
ALBUMIN: 3.6 g/dL (ref 3.5–5.0)
ALT: 14 U/L (ref 0–44)
ANION GAP: 9 (ref 5–15)
AST: 27 U/L (ref 15–41)
Alkaline Phosphatase: 49 U/L (ref 38–126)
BUN: <5 mg/dL — ABNORMAL LOW (ref 8–23)
CO2: 22 mmol/L (ref 22–32)
Calcium: 8.9 mg/dL (ref 8.9–10.3)
Chloride: 95 mmol/L — ABNORMAL LOW (ref 98–111)
Creatinine, Ser: 0.58 mg/dL — ABNORMAL LOW (ref 0.61–1.24)
GFR calc non Af Amer: >60 mL/min (ref >60)
GLUCOSE: 127 mg/dL — AB (ref 70–99)
Potassium: 4.2 mmol/L (ref 3.5–5.1)
SODIUM: 126 mmol/L — AB (ref 135–145)
Total Bilirubin: 0.5 mg/dL (ref 0.3–1.2)
Total Protein: 6.6 g/dL (ref 6.5–8.1)

## 2018-08-26 LAB — APTT: aPTT: 34 seconds (ref 24–36)

## 2018-08-26 LAB — PROTIME-INR
INR: 0.97
Prothrombin Time: 12.8 seconds (ref 11.4–15.2)

## 2018-08-26 NOTE — Telephone Encounter (Signed)
Orders have been signed by Judson Roch.

## 2018-08-26 NOTE — Progress Notes (Signed)
Per Dr. Juline Patch order set for blood work - needs to done within (7) days of surgery.  Pt's surgery date is 09-02-18, but coming in for pre-op appointment today, 08-26-18, making it (8) days from surgery for blood draw.  Paged Dr. Valeta Harms, and per instructions, he is ok with (8) days out.  Stated that is an old order set that needs to be updated and he does not have to the blood drawn within (7) days of surgery.

## 2018-08-26 NOTE — Pre-Procedure Instructions (Signed)
Sean Rose  08/26/2018      [id number] - [place], [place]. Sean Rose [address] Phone: [phone] Fax: [place], [address] AT [place] [address] Phone: [phone] Fax: [phone]    Your procedure is scheduled on February 5th.  Report to [facility] at 6:30 A.M.  Call this number if you have problems the morning of surgery:  [phone]   Remember:  Do not eat or drink after midnight.    Take these medicines the morning of surgery with A SIP OF WATER   Metoprolol  Nitroglycerin - if needed  Anoro Ellipta Inhaler  Follow your surgeon's instructions on when to stop Asprin & Plavix.  If no instructions were given by your surgeon then you will need to call the office to get those instructions.  7 days prior to surgery STOP taking any Aleve, Naproxen, Ibuprofen, Motrin, Advil, Goody's, BC's, all herbal medications, fish oil, and all vitamins.      Do not wear jewelry.  Do not wear lotions, powders, colognes, or deodorant.  Men may shave face and neck.  Do not bring valuables to the hospital.  [facility] is not responsible for any belongings or valuables.   [name]- Preparing For Surgery  Before surgery, you can play an important role. Because skin is not sterile, your skin needs to be as free of germs as possible. You can reduce the number of germs on your skin by washing with CHG (chlorahexidine gluconate) Soap before surgery.  CHG is an antiseptic cleaner which kills germs and bonds with the skin to continue killing germs even after washing.    Oral Hygiene is also important to reduce your risk of infection.  Remember - BRUSH YOUR TEETH THE MORNING OF SURGERY WITH YOUR REGULAR TOOTHPASTE  Please do not use if you have an allergy to CHG or antibacterial  soaps. If your skin becomes reddened/irritated stop using the CHG.  Do not shave (including legs and underarms) for at least 48 hours prior to first CHG shower. It is OK to shave your face.  Please follow these instructions carefully.   1. Shower the NIGHT BEFORE SURGERY and the MORNING OF SURGERY with CHG.   2. If you chose to wash your hair, wash your hair first as usual with your normal shampoo.  3. After you shampoo, rinse your hair and body thoroughly to remove the shampoo.  4. Use CHG as you would any other liquid soap. You can apply CHG directly to the skin and wash gently with a scrungie or a clean washcloth.   5. Apply the CHG Soap to your body ONLY FROM THE NECK DOWN.  Do not use on open wounds or open sores. Avoid contact with your eyes, ears, mouth and genitals (private parts). Wash Face and genitals (private parts)  with your normal soap.  6. Wash thoroughly, paying special attention to the area where your surgery will be performed.  7. Thoroughly rinse your body with warm water from the neck down.  8. DO NOT shower/wash with your normal soap after using and rinsing off the CHG Soap.  9. Pat yourself dry with a CLEAN TOWEL.  10. Wear CLEAN PAJAMAS to bed the night before surgery, wear comfortable clothes the morning of surgery  11. Place CLEAN SHEETS on your bed  the night of your first shower and DO NOT SLEEP WITH PETS.   Day of Surgery:  Do not apply any deodorants/lotions.  Please wear clean clothes to the hospital/surgery center.   Remember to brush your teeth WITH YOUR REGULAR TOOTHPASTE.   Contacts, dentures or bridgework may not be worn into surgery.  Leave your suitcase in the car.  After surgery it may be brought to your room.  For patients admitted to the hospital, discharge time will be determined by your treatment team.  Patients discharged the day of surgery will not be allowed to drive home.

## 2018-08-26 NOTE — Progress Notes (Addendum)
PCP - [name] Cardiologist - Dr. Gwenlyn Found  Chest x-ray - 07-08-18 EKG - 07-09-18  ECHO - 2014 Cardiac Cath - 2016    Blood Thinner Instructions: Per Dr. Valeta Harms - stop Plavix 08-26-18 Aspirin Instructions: Per Dr. Valeta Harms - ok to continue ASA  Anesthesia review: Yes, heart history, abnormal labs Message sent to Dr. Valeta Harms, as well regarding abnormal labs (08-26-18)  Pt stated he has sporadic cough (started about a month ago), not a new symptom, no fever associated  Patient denies shortness of breath, fever, and chest pain at PAT appointment   Patient verbalized understanding of instructions that were given to them at the PAT appointment. Patient was also instructed that they will need to review over the PAT instructions again at home before surgery.

## 2018-08-27 ENCOUNTER — Telehealth: Payer: Self-pay | Admitting: Pulmonary Disease

## 2018-08-27 DIAGNOSIS — E871 Hypo-osmolality and hyponatremia: Secondary | ICD-10-CM

## 2018-08-27 NOTE — Telephone Encounter (Signed)
LMTCB  Phone call made to Dr. Valeta Harms, okay to continue aspirin 81mg . Anything higher than that he will need to stop. Patient will need to come in Monday to repeat lab work.

## 2018-08-27 NOTE — Progress Notes (Signed)
Anesthesia Chart Review:  Case:  [id number] Date/Time:  09/02/18 0815   Procedures:      VIDEO BRONCHOSCOPY WITH ENDOBRONCHIAL NAVIGATION (N/A )     VIDEO BRONCHOSCOPY WITH ENDOBRONCHIAL ULTRASOUND (N/A )   Anesthesia type:  General   Pre-op diagnosis:  lung mass   Location:  [facility] ROOM 10 / [facility]   Surgeon:  Garner Nash, DO      DISCUSSION: 63 yo male current smoker.  Pertinent hx includes CAD (s/p BMS to RCA with re-dilation 2015 if ISR), PVD (Right common iliac artery stent 2007, Left common iliac artery stent 2016), HTN, COPD (mild).  Pt was seen at [facility] 07/08/18 for syncopal episode. He subsequently followed up with Dr. Gwenlyn Found 07/23/18 and per his note "Since I saw him a year ago he's remained stable. He denies chest pain or shortness of breath.  He has had an episode of syncope on 07/06/2018 at work which sounded vagal to me."  He recommended 30d holter, which has note been done yet.   Cardiac clearance in telephone encounter bu Almyra Deforest, [place] 07/20/2018 "I discussed the case with Dr. Gwenlyn Found, his cardiologist, we will cleared the patient to hold Plavix for 5 to 7 days for potential bronchoscopy with biopsy.  He is cleared for the bronchoscopy procedure.  However, Mr. Arth is aware that if open chest surgery or lobectomy is required after pathology come back, he will need office visit and stress test prior to any open chest surgery."  Review of labs shows pt has history of hyponatremia with baseline ~129. Sodium 126 on preop labs. Dr. Valeta Harms notified that value lower than 126 on DOS could be cause for cancellation. CBC ordered for DOS.   Anticipate he can proceed as planned barring acute status change.   VS: BP (!) 152/77   Pulse 62   Temp 36.8 C   Resp 20   Ht _0  (1.727 m)   Wt 58.1 kg   SpO2 100%   BMI 19.48 kg/m   PROVIDERS: [name], NP is PCP  Ancil Linsey, MD is Cardiologsit  LABS: Hyponatremia, Dr. Juline Patch office notified. (all labs ordered are listed, but  only abnormal results are displayed)  Labs Reviewed  CBC - Abnormal; Notable for the following components:      Result Value   RBC 4.18 (*)    MCH 35.2 (*)    All other components within normal limits  COMPREHENSIVE METABOLIC PANEL - Abnormal; Notable for the following components:   Sodium 126 (*)    Chloride 95 (*)    Glucose, Bld 127 (*)    BUN <5 (*)    Creatinine, Ser 0.58 (*)    All other components within normal limits  APTT  PROTIME-INR     IMAGES: CT Super D Chest 08/20/18: IMPRESSION: 1. Persistence of multiple aggressive appearing asymmetrically distributed pulmonary nodules in the lungs bilaterally, most evident in the right upper lobe. Minimal change compared to the prior study from 07/08/2018, however, the these nodules all appear either stable or slightly larger in size than the prior examination. Overall, these remain most likely to be of infectious etiology, however, tissue sampling may be warranted to exclude neoplasm and to attempt to identify a causative organism to guide antimicrobial therapy. 2. Aortic atherosclerosis, in addition to left main and 2 vessel coronary artery disease. Please note that although the presence of coronary artery calcium documents the presence of coronary artery disease, the severity of this disease and  any potential stenosis cannot be assessed on this non-gated CT examination. Assessment for potential risk factor modification, dietary therapy or pharmacologic therapy may be warranted, if clinically indicated. 3. Diffuse bronchial wall thickening with moderate centrilobular and paraseptal emphysema; imaging findings suggestive of underlying COPD. 4. Right adrenal adenoma again noted.  Aortic Atherosclerosis (ICD10-I70.0) and Emphysema (ICD10-J43.9).   EKG: 07/08/2018: Sinus bradycardia. Rate 55. Minimal voltage criteria for LVH, may be normal variant   CV: TTE 11/26/2012: Study Conclusions  - Left ventricle: The cavity  size was normal. Wall thickness was normal. Wall motion was normal; there were no regional wall motion abnormalities. The transmitral flow pattern was normal. The deceleration time of the early transmitral flow velocity was normal. The pulmonary vein flow pattern was normal. The tissue Doppler parameters were mildly abnormal, with an A wave predominance. Left ventricular diastolic function parameters are borderline abnormal. LV filling pressure is normal. - Aortic valve: Sclerosis without stenosis. No regurgitation. - Mitral valve: Structurally normal valve. No significant regurgitation. - Left atrium: LA Volume/BSA 25.6 ml/m2. The atrium was normal in size. - Inferior vena cava: The vessel was normal in size; the respirophasic diameter changes were in the normal range (= 50%); findings are consistent with normal central venous pressure.  Nuclear stress 11/17/2012: LV Ejection Fraction: 60%  Impression Exercise Capacity:  Lexiscan with no exercise. BP Response:  Normal blood pressure response. Clinical Symptoms:  There is dyspnea. ECG Impression:  No significant ECG changes with Lexiscan. Comparison with Prior Nuclear Study: The septal perfusion defect is new when compared to the study from 2010.    LV Wall Motion:  Normal LV Function with a "focal" mid inferior wall perfusion imaging abnormality with hypokinesis is noted.  Overall Impression:  Abnormal Myocardial Perfusion Imaging with a basal to mid perfusion defect suggestive of previous MI. Low risk stress nuclear study in the absence of "ischmia" findings.  Clinical Correlation is warranted.   Past Medical History:  Diagnosis Date  . Anginal pain ([place])   . CAD (coronary artery disease)   . CAP (community acquired pneumonia) 09/2016  . COPD (chronic obstructive pulmonary disease) ([place])   . GERD (gastroesophageal reflux disease)   . Heart murmur    "I was told I had one when I was a kid"  .  Hemorrhoids   . History of anal fissures    "no surgeries" (10/30/2016)  . Hyperlipidemia   . Hypertension   . Peripheral arterial disease ([place])    status post right common iliac artery stenting back in 2007  . Seasonal allergies   . Tobacco abuse     Past Surgical History:  Procedure Laterality Date  . ANKLE SURGERY Left    "rebuilt it"  . ANTERIOR CRUCIATE LIGAMENT REPAIR Right   . CARDIAC CATHETERIZATION  09/23/2008   Continued medical therapy - may need GI evaluation in addition.  Marland Kitchen CARDIAC CATHETERIZATION  10/28/2007   Medical therapy recommended.  Marland Kitchen CARDIAC CATHETERIZATION  11/18/2006   In-stent restenosis RCA  (50% distal edge, 80% segmental mid, and 50-60% segmental proximal). Successful cutting balloon atherectomy using a 325X15 cutting balloon. 3 inflations with atherectomy performed on mid and proximal portions resulting in reduction of 80% mid in-stent restenosis to less than 20% residual and 50-60% segmental proximal to less than 20% residual without dissection.  Marland Kitchen CARDIAC CATHETERIZATION  02/26/2006   Severe stenosis in RCA. Stenting performed using IVUS. 3.5x20 Maverick balloon deployed at [employer]. Distal stent-a 4x28 Liberte stent-deployed 12atm 48sec, 12atm 31sec,  4atm 19sec. Mid stent-a 4x28 Liberte stent-deployed 14atm 45sec, 14atm 60sec, 14atm 44sec. Proximal stent-4x8 Liberte- 14atm 45sec,14atm 47sec, 16atm 43sec. Severely diseased segment then appeared TIMI-3 flow.  Marland Kitchen CARDIOVASCULAR STRESS TEST  11/17/2012   No significant ECG changes. Septal perfusion defect is new when complared to study from 2010. Abnormal myocardial perfusion imaging with a basal to mid perfusion suggestive of previous MI.  Marland Kitchen CAROTID DOPPLER  08/09/2011   Bilateral Bulb/Proximal ICA - demonstrated a mild amount of fibrous plaque without evidence of significant diameter reduction reduction or other vascular abnormality.  . COLONOSCOPY     2003, 2014  . CORONARY ANGIOPLASTY WITH STENT PLACEMENT    .  FEMORAL ARTERY STENT    . INGUINAL HERNIA REPAIR Right   . KNEE ARTHROSCOPY Right "multiple"  . LOWER EXTREMITY ARTERIAL DOPPLER  01/31/2011   Bilateral ABIs-normal values with no suggestion of arterial insuff to the lower extremities at rest. Right CIA stent-mild amount of nonhemodynamically significant plaque is noted throughout  . 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 PERCUTANEOUS STENT INTERVENTION  04/04/2006 & 04/13/2015   a. Right common iliac artery with an 8.0x18 mm Herculink stent deployed at 12 atm. Stenosis was reduced from 80% to 0% with brisk flow. b. I-cast stenting to left common iliac artery  . PERIPHERAL VASCULAR CATHETERIZATION N/A 04/13/2015   Procedure: Lower Extremity Angiography;  Surgeon: Lorretta Harp, MD; L-oCIA 75%, 40-50% L-EIA, R-CIA stent patent, s/p 8 mm x 38 mm ICast covered stent>>0% stenosis in L-oCIA     . SHOULDER ARTHROSCOPY WITH ROTATOR CUFF REPAIR Right   . TRANSTHORACIC ECHOCARDIOGRAM  11/26/2012   EF not noted. Aortic valve-sclerosis without stenosis, no regurgiation.   Marland Kitchen UPPER GASTROINTESTINAL ENDOSCOPY    . US CAROTID DOPPLER BILATERAL ([place] HX)  08/09/2011   Bilateral Bulb/Proximal ICAa demonstrated a mild amount of fibrous plaque without evidence of significant diameter reduction or any other vascular abnormality.    MEDICATIONS: . amLODipine (NORVASC) 5 MG tablet  . aspirin EC 81 MG tablet  . clopidogrel (PLAVIX) 75 MG tablet  . diphenhydrAMINE (BENADRYL) 25 mg capsule  . ibuprofen (ADVIL,MOTRIN) 200 MG tablet  . Magnesium 250 MG TABS  . metoprolol succinate (TOPROL-XL) 100 MG 24 hr tablet  . Multiple Vitamin (MULTIVITAMIN WITH MINERALS) TABS tablet  . nitroGLYCERIN (NITROSTAT) 0.4 MG SL tablet  . umeclidinium-vilanterol (ANORO ELLIPTA) 62.5-25 MCG/INH AEPB   No current facility-administered medications for this encounter.      Wynonia Musty [facility] Short Stay Center/Anesthesiology Phone [phone] 08/27/2018 12:56  PM

## 2018-08-27 NOTE — Anesthesia Preprocedure Evaluation (Addendum)
Anesthesia Evaluation  Patient identified by MRN, date of birth, ID band Patient awake    Reviewed: Allergy & Precautions, NPO status , Patient's Chart, lab work & pertinent test results, reviewed documented beta blocker date and time   Airway Mallampati: III  TM Distance: >3 FB Neck ROM: Full    Dental no notable dental hx. (+) Missing, Dental Advisory Given   Pulmonary COPD,  COPD inhaler, Current Smoker,    Pulmonary exam normal breath sounds clear to auscultation       Cardiovascular hypertension, Pt. on medications and Pt. on home beta blockers + angina + CAD, + Cardiac Stents (x 3) and + Peripheral Vascular Disease  Normal cardiovascular exam Rhythm:Regular Rate:Normal  ECG: SB, rate 55   Neuro/Psych negative neurological ROS  negative psych ROS   GI/Hepatic negative GI ROS, Neg liver ROS,   Endo/Other  Hyponatremia   Renal/GU negative Renal ROS     Musculoskeletal negative musculoskeletal ROS (+)   Abdominal   Peds  Hematology negative hematology ROS (+) HLD   Anesthesia Other Findings upper lobe multiple right lung nodules  Reproductive/Obstetrics                         Anesthesia Physical Anesthesia Plan  ASA: IV  Anesthesia Plan: General   Post-op Pain Management:    Induction: Intravenous  PONV Risk Score and Plan: 1 and Ondansetron, Dexamethasone, Midazolam and Treatment may vary due to age or medical condition  Airway Management Planned: Oral ETT  Additional Equipment:   Intra-op Plan:   Post-operative Plan: Extubation in OR  Informed Consent: I have reviewed the patients History and Physical, chart, labs and discussed the procedure including the risks, benefits and alternatives for the proposed anesthesia with the patient or authorized representative who has indicated his/her understanding and acceptance.     Dental advisory given  Plan Discussed with:  CRNA  Anesthesia Plan Comments: (Reviewed PAT note by Karoline Caldwell, PA-C )      Anesthesia Quick Evaluation

## 2018-08-27 NOTE — Telephone Encounter (Signed)
Pt does have a bronch scheduled for 09/02/2018 but the lab called with results and stated pt had a sodium level of 126. He states the anesthesiologist may want to cancel the surgery if this level doesn't go down by then. Will forward to Dr. Valeta Harms to advise on what to do next.

## 2018-08-27 NOTE — Telephone Encounter (Signed)
LMTCB x1 for pt to speak to him about his ASA being stopped before the bronch.

## 2018-08-27 NOTE — Telephone Encounter (Signed)
Call made to patient, made aware he can continue his 81mg  aspirin but he will need to stop by the office Monday to repeat his lab work. Lab orders placed. Patient voiced understanding. Nothing further is needed at this time.

## 2018-08-27 NOTE — Telephone Encounter (Signed)
Patient returned call, CB is [phone].

## 2018-08-28 ENCOUNTER — Other Ambulatory Visit: Payer: Self-pay | Admitting: *Deleted

## 2018-08-28 DIAGNOSIS — I739 Peripheral vascular disease, unspecified: Secondary | ICD-10-CM

## 2018-08-31 ENCOUNTER — Other Ambulatory Visit (INDEPENDENT_AMBULATORY_CARE_PROVIDER_SITE_OTHER): Payer: BLUE CROSS/BLUE SHIELD

## 2018-08-31 DIAGNOSIS — E871 Hypo-osmolality and hyponatremia: Secondary | ICD-10-CM

## 2018-08-31 LAB — COMPREHENSIVE METABOLIC PANEL
ALT: 15 U/L (ref 0–53)
AST: 24 U/L (ref 0–37)
Albumin: 4 g/dL (ref 3.5–5.2)
Alkaline Phosphatase: 56 U/L (ref 39–117)
BUN: 3 mg/dL — ABNORMAL LOW (ref 6–23)
CHLORIDE: 95 meq/L — AB (ref 96–112)
CO2: 27 mEq/L (ref 19–32)
Calcium: 8.8 mg/dL (ref 8.4–10.5)
Creatinine, Ser: 0.68 mg/dL (ref 0.40–1.50)
GFR: 117.93 mL/min (ref >60.00)
Glucose, Bld: 124 mg/dL — ABNORMAL HIGH (ref 70–99)
Potassium: 3.2 mEq/L — ABNORMAL LOW (ref 3.5–5.1)
Sodium: 128 mEq/L — ABNORMAL LOW (ref 135–145)
Total Bilirubin: 0.3 mg/dL (ref 0.2–1.2)
Total Protein: 6.7 g/dL (ref 6.0–8.3)

## 2018-09-02 ENCOUNTER — Inpatient Hospital Stay (HOSPITAL_COMMUNITY): Payer: BLUE CROSS/BLUE SHIELD

## 2018-09-02 ENCOUNTER — Ambulatory Visit (HOSPITAL_COMMUNITY): Payer: BLUE CROSS/BLUE SHIELD

## 2018-09-02 ENCOUNTER — Encounter (HOSPITAL_COMMUNITY): Payer: Self-pay | Admitting: *Deleted

## 2018-09-02 ENCOUNTER — Ambulatory Visit (HOSPITAL_COMMUNITY): Payer: BLUE CROSS/BLUE SHIELD | Admitting: Certified Registered Nurse Anesthetist

## 2018-09-02 ENCOUNTER — Ambulatory Visit (HOSPITAL_COMMUNITY): Payer: BLUE CROSS/BLUE SHIELD | Admitting: Physician Assistant

## 2018-09-02 ENCOUNTER — Encounter (HOSPITAL_COMMUNITY)
Admission: AD | Disposition: A | Discharge status: Home / Self Care | Payer: Self-pay | Source: Ambulatory Visit | Attending: Thoracic Surgery (Cardiothoracic Vascular Surgery)

## 2018-09-02 ENCOUNTER — Inpatient Hospital Stay (HOSPITAL_COMMUNITY)
Admission: AD | Admit: 2018-09-02 | Discharge: 2018-09-24 | DRG: 163 | Disposition: A | Discharge status: Home / Self Care | Payer: BLUE CROSS/BLUE SHIELD | Source: Ambulatory Visit | Attending: Thoracic Surgery (Cardiothoracic Vascular Surgery) | Admitting: Thoracic Surgery (Cardiothoracic Vascular Surgery)

## 2018-09-02 ENCOUNTER — Other Ambulatory Visit: Payer: Self-pay

## 2018-09-02 DIAGNOSIS — K567 Ileus, unspecified: Secondary | ICD-10-CM | POA: Diagnosis not present

## 2018-09-02 DIAGNOSIS — J9690 Respiratory failure, unspecified, unspecified whether with hypoxia or hypercapnia: Secondary | ICD-10-CM

## 2018-09-02 DIAGNOSIS — Z7982 Long term (current) use of aspirin: Secondary | ICD-10-CM

## 2018-09-02 DIAGNOSIS — F419 Anxiety disorder, unspecified: Secondary | ICD-10-CM | POA: Diagnosis not present

## 2018-09-02 DIAGNOSIS — I2510 Atherosclerotic heart disease of native coronary artery without angina pectoris: Secondary | ICD-10-CM | POA: Diagnosis not present

## 2018-09-02 DIAGNOSIS — R911 Solitary pulmonary nodule: Secondary | ICD-10-CM | POA: Diagnosis present

## 2018-09-02 DIAGNOSIS — Y848 Other medical procedures as the cause of abnormal reaction of the patient, or of later complication, without mention of misadventure at the time of the procedure: Secondary | ICD-10-CM | POA: Diagnosis not present

## 2018-09-02 DIAGNOSIS — F17210 Nicotine dependence, cigarettes, uncomplicated: Secondary | ICD-10-CM | POA: Diagnosis present

## 2018-09-02 DIAGNOSIS — Z419 Encounter for procedure for purposes other than remedying health state, unspecified: Secondary | ICD-10-CM

## 2018-09-02 DIAGNOSIS — C3491 Malignant neoplasm of unspecified part of right bronchus or lung: Secondary | ICD-10-CM | POA: Diagnosis not present

## 2018-09-02 DIAGNOSIS — C771 Secondary and unspecified malignant neoplasm of intrathoracic lymph nodes: Secondary | ICD-10-CM | POA: Diagnosis present

## 2018-09-02 DIAGNOSIS — J9311 Primary spontaneous pneumothorax: Secondary | ICD-10-CM | POA: Diagnosis not present

## 2018-09-02 DIAGNOSIS — E876 Hypokalemia: Secondary | ICD-10-CM | POA: Diagnosis not present

## 2018-09-02 DIAGNOSIS — R918 Other nonspecific abnormal finding of lung field: Secondary | ICD-10-CM | POA: Diagnosis not present

## 2018-09-02 DIAGNOSIS — R0902 Hypoxemia: Secondary | ICD-10-CM | POA: Diagnosis not present

## 2018-09-02 DIAGNOSIS — E785 Hyperlipidemia, unspecified: Secondary | ICD-10-CM | POA: Diagnosis not present

## 2018-09-02 DIAGNOSIS — Z01818 Encounter for other preprocedural examination: Secondary | ICD-10-CM

## 2018-09-02 DIAGNOSIS — J95811 Postprocedural pneumothorax: Secondary | ICD-10-CM | POA: Diagnosis not present

## 2018-09-02 DIAGNOSIS — E871 Hypo-osmolality and hyponatremia: Secondary | ICD-10-CM | POA: Diagnosis present

## 2018-09-02 DIAGNOSIS — Y92234 Operating room of hospital as the place of occurrence of the external cause: Secondary | ICD-10-CM | POA: Diagnosis not present

## 2018-09-02 DIAGNOSIS — J432 Centrilobular emphysema: Secondary | ICD-10-CM | POA: Diagnosis present

## 2018-09-02 DIAGNOSIS — C7801 Secondary malignant neoplasm of right lung: Secondary | ICD-10-CM | POA: Diagnosis present

## 2018-09-02 DIAGNOSIS — I700 Atherosclerosis of aorta: Secondary | ICD-10-CM | POA: Diagnosis present

## 2018-09-02 DIAGNOSIS — J9382 Other air leak: Secondary | ICD-10-CM | POA: Diagnosis not present

## 2018-09-02 DIAGNOSIS — J939 Pneumothorax, unspecified: Secondary | ICD-10-CM

## 2018-09-02 DIAGNOSIS — C3490 Malignant neoplasm of unspecified part of unspecified bronchus or lung: Secondary | ICD-10-CM | POA: Diagnosis not present

## 2018-09-02 DIAGNOSIS — J439 Emphysema, unspecified: Secondary | ICD-10-CM | POA: Diagnosis not present

## 2018-09-02 DIAGNOSIS — Z9889 Other specified postprocedural states: Secondary | ICD-10-CM

## 2018-09-02 DIAGNOSIS — J189 Pneumonia, unspecified organism: Secondary | ICD-10-CM | POA: Diagnosis not present

## 2018-09-02 DIAGNOSIS — I959 Hypotension, unspecified: Secondary | ICD-10-CM | POA: Diagnosis not present

## 2018-09-02 DIAGNOSIS — Z79899 Other long term (current) drug therapy: Secondary | ICD-10-CM

## 2018-09-02 DIAGNOSIS — R079 Chest pain, unspecified: Secondary | ICD-10-CM | POA: Diagnosis not present

## 2018-09-02 DIAGNOSIS — C3411 Malignant neoplasm of upper lobe, right bronchus or lung: Principal | ICD-10-CM | POA: Diagnosis present

## 2018-09-02 DIAGNOSIS — I10 Essential (primary) hypertension: Secondary | ICD-10-CM | POA: Diagnosis not present

## 2018-09-02 DIAGNOSIS — I739 Peripheral vascular disease, unspecified: Secondary | ICD-10-CM | POA: Diagnosis present

## 2018-09-02 DIAGNOSIS — Z8701 Personal history of pneumonia (recurrent): Secondary | ICD-10-CM

## 2018-09-02 DIAGNOSIS — Z8249 Family history of ischemic heart disease and other diseases of the circulatory system: Secondary | ICD-10-CM

## 2018-09-02 DIAGNOSIS — Z4682 Encounter for fitting and adjustment of non-vascular catheter: Secondary | ICD-10-CM | POA: Diagnosis not present

## 2018-09-02 DIAGNOSIS — J930 Spontaneous tension pneumothorax: Secondary | ICD-10-CM | POA: Diagnosis not present

## 2018-09-02 DIAGNOSIS — R0602 Shortness of breath: Secondary | ICD-10-CM | POA: Diagnosis not present

## 2018-09-02 DIAGNOSIS — Z902 Acquired absence of lung [part of]: Secondary | ICD-10-CM

## 2018-09-02 DIAGNOSIS — R443 Hallucinations, unspecified: Secondary | ICD-10-CM | POA: Diagnosis not present

## 2018-09-02 DIAGNOSIS — Z885 Allergy status to narcotic agent status: Secondary | ICD-10-CM

## 2018-09-02 DIAGNOSIS — Z800 Family history of malignant neoplasm of digestive organs: Secondary | ICD-10-CM

## 2018-09-02 DIAGNOSIS — Z7902 Long term (current) use of antithrombotics/antiplatelets: Secondary | ICD-10-CM

## 2018-09-02 DIAGNOSIS — J479 Bronchiectasis, uncomplicated: Secondary | ICD-10-CM | POA: Diagnosis not present

## 2018-09-02 DIAGNOSIS — Z9689 Presence of other specified functional implants: Secondary | ICD-10-CM

## 2018-09-02 DIAGNOSIS — Z888 Allergy status to other drugs, medicaments and biological substances status: Secondary | ICD-10-CM

## 2018-09-02 DIAGNOSIS — F515 Nightmare disorder: Secondary | ICD-10-CM | POA: Diagnosis not present

## 2018-09-02 DIAGNOSIS — K219 Gastro-esophageal reflux disease without esophagitis: Secondary | ICD-10-CM | POA: Diagnosis not present

## 2018-09-02 DIAGNOSIS — Z955 Presence of coronary angioplasty implant and graft: Secondary | ICD-10-CM

## 2018-09-02 DIAGNOSIS — T85848A Pain due to other internal prosthetic devices, implants and grafts, initial encounter: Secondary | ICD-10-CM

## 2018-09-02 HISTORY — DX: Postprocedural pneumothorax: J95.811

## 2018-09-02 HISTORY — PX: VIDEO BRONCHOSCOPY WITH ENDOBRONCHIAL NAVIGATION: SHX6175

## 2018-09-02 HISTORY — PX: CHEST TUBE INSERTION: SHX231

## 2018-09-02 LAB — CREATININE, SERUM
Creatinine, Ser: 0.75 mg/dL (ref 0.61–1.24)
GFR calc Af Amer: >60 mL/min (ref >60)

## 2018-09-02 LAB — CBC
HEMATOCRIT: 41.5 % (ref 39.0–52.0)
Hemoglobin: 14.7 g/dL (ref 13.0–17.0)
MCH: 35 pg — ABNORMAL HIGH (ref 26.0–34.0)
MCHC: 35.4 g/dL (ref 30.0–36.0)
MCV: 98.8 fL (ref 80.0–100.0)
Platelets: 307 10*3/uL (ref 150–400)
RBC: 4.2 MIL/uL — ABNORMAL LOW (ref 4.22–5.81)
RDW: 12.7 % (ref 11.5–15.5)
WBC: 12.1 10*3/uL — ABNORMAL HIGH (ref 4.0–10.5)
nRBC: 0 % (ref 0.0–0.2)

## 2018-09-02 LAB — POCT I-STAT 4, (NA,K, GLUC, HGB,HCT)
Glucose, Bld: 101 mg/dL — ABNORMAL HIGH (ref 70–99)
HCT: 42 % (ref 39.0–52.0)
Hemoglobin: 14.3 g/dL (ref 13.0–17.0)
Potassium: 3.5 mmol/L (ref 3.5–5.1)
Sodium: 133 mmol/L — ABNORMAL LOW (ref 135–145)

## 2018-09-02 SURGERY — VIDEO BRONCHOSCOPY WITH ENDOBRONCHIAL NAVIGATION
Anesthesia: General | Site: Chest | Laterality: Right

## 2018-09-02 MED ORDER — MIDAZOLAM HCL 2 MG/2ML IJ SOLN
INTRAMUSCULAR | Status: AC
  Filled 2018-09-02: qty 2

## 2018-09-02 MED ORDER — DEXAMETHASONE SODIUM PHOSPHATE 10 MG/ML IJ SOLN
INTRAMUSCULAR | Status: AC
  Filled 2018-09-02: qty 1

## 2018-09-02 MED ORDER — HEPARIN SODIUM (PORCINE) 5000 UNIT/ML IJ SOLN
5000.0000 [IU] | Freq: Three times a day (TID) | INTRAMUSCULAR | Status: AC
  Administered 2018-09-02 – 2018-09-13 (×33): 5000 [IU] via SUBCUTANEOUS
  Filled 2018-09-02 (×33): qty 1

## 2018-09-02 MED ORDER — ROCURONIUM BROMIDE 50 MG/5ML IV SOSY
PREFILLED_SYRINGE | INTRAVENOUS | Status: AC
  Filled 2018-09-02: qty 5

## 2018-09-02 MED ORDER — HYDROMORPHONE HCL 1 MG/ML IJ SOLN
0.2500 mg | INTRAMUSCULAR | Status: DC | PRN
  Administered 2018-09-02: 0.25 mg via INTRAVENOUS

## 2018-09-02 MED ORDER — FENTANYL CITRATE (PF) 100 MCG/2ML IJ SOLN
INTRAMUSCULAR | Status: DC | PRN
  Administered 2018-09-02: 100 ug via INTRAVENOUS
  Administered 2018-09-02 (×2): 50 ug via INTRAVENOUS

## 2018-09-02 MED ORDER — SODIUM CHLORIDE 0.9 % IV SOLN
INTRAVENOUS | Status: DC
  Administered 2018-09-02: 08:00:00 via INTRAVENOUS

## 2018-09-02 MED ORDER — HYDROMORPHONE HCL 1 MG/ML IJ SOLN
0.2500 mg | INTRAMUSCULAR | Status: DC | PRN
  Administered 2018-09-02 – 2018-09-05 (×6): 0.5 mg via INTRAVENOUS
  Filled 2018-09-02 (×6): qty 1

## 2018-09-02 MED ORDER — HYDROMORPHONE HCL 1 MG/ML IJ SOLN
INTRAMUSCULAR | Status: AC
  Filled 2018-09-02: qty 1

## 2018-09-02 MED ORDER — FENTANYL CITRATE (PF) 250 MCG/5ML IJ SOLN
INTRAMUSCULAR | Status: AC
  Filled 2018-09-02: qty 5

## 2018-09-02 MED ORDER — PROPOFOL 10 MG/ML IV BOLUS
INTRAVENOUS | Status: DC | PRN
  Administered 2018-09-02: 150 mg via INTRAVENOUS

## 2018-09-02 MED ORDER — PROPOFOL 10 MG/ML IV BOLUS
INTRAVENOUS | Status: AC
  Filled 2018-09-02: qty 20

## 2018-09-02 MED ORDER — LACTATED RINGERS IV SOLN
INTRAVENOUS | Status: DC | PRN
  Administered 2018-09-02: 09:00:00 via INTRAVENOUS

## 2018-09-02 MED ORDER — ROCURONIUM BROMIDE 100 MG/10ML IV SOLN
INTRAVENOUS | Status: DC | PRN
  Administered 2018-09-02: 20 mg via INTRAVENOUS
  Administered 2018-09-02: 50 mg via INTRAVENOUS

## 2018-09-02 MED ORDER — LIDOCAINE HCL (CARDIAC) PF 100 MG/5ML IV SOSY
PREFILLED_SYRINGE | INTRAVENOUS | Status: DC | PRN
  Administered 2018-09-02: 60 mg via INTRAVENOUS

## 2018-09-02 MED ORDER — PHENYLEPHRINE 40 MCG/ML (10ML) SYRINGE FOR IV PUSH (FOR BLOOD PRESSURE SUPPORT)
PREFILLED_SYRINGE | INTRAVENOUS | Status: AC
  Filled 2018-09-02: qty 20

## 2018-09-02 MED ORDER — LIDOCAINE 2% (20 MG/ML) 5 ML SYRINGE
INTRAMUSCULAR | Status: AC
  Filled 2018-09-02: qty 10

## 2018-09-02 MED ORDER — ONDANSETRON HCL 4 MG/2ML IJ SOLN
INTRAMUSCULAR | Status: AC
  Filled 2018-09-02: qty 2

## 2018-09-02 MED ORDER — EPHEDRINE 5 MG/ML INJ
INTRAVENOUS | Status: AC
  Filled 2018-09-02: qty 10

## 2018-09-02 MED ORDER — PHENYLEPHRINE 40 MCG/ML (10ML) SYRINGE FOR IV PUSH (FOR BLOOD PRESSURE SUPPORT)
PREFILLED_SYRINGE | INTRAVENOUS | Status: AC
  Filled 2018-09-02: qty 10

## 2018-09-02 MED ORDER — 0.9 % SODIUM CHLORIDE (POUR BTL) OPTIME
TOPICAL | Status: DC | PRN
  Administered 2018-09-02: 1000 mL

## 2018-09-02 MED ORDER — DEXAMETHASONE SODIUM PHOSPHATE 10 MG/ML IJ SOLN
INTRAMUSCULAR | Status: DC | PRN
  Administered 2018-09-02: 5 mg via INTRAVENOUS

## 2018-09-02 MED ORDER — MIDAZOLAM HCL 5 MG/5ML IJ SOLN
INTRAMUSCULAR | Status: DC | PRN
  Administered 2018-09-02: 2 mg via INTRAVENOUS

## 2018-09-02 MED ORDER — PHENYLEPHRINE HCL 10 MG/ML IJ SOLN
INTRAMUSCULAR | Status: DC | PRN
  Administered 2018-09-02: 8 ug via INTRAVENOUS
  Administered 2018-09-02 (×7): 80 ug via INTRAVENOUS

## 2018-09-02 MED ORDER — ONDANSETRON HCL 4 MG/2ML IJ SOLN
INTRAMUSCULAR | Status: DC | PRN
  Administered 2018-09-02: 4 mg via INTRAVENOUS

## 2018-09-02 MED ORDER — IBUPROFEN 200 MG PO TABS: 600.0000 mg | ORAL_TABLET | Freq: Four times a day (QID) | ORAL | Status: DC | PRN

## 2018-09-02 MED ORDER — SUGAMMADEX SODIUM 200 MG/2ML IV SOLN
INTRAVENOUS | Status: DC | PRN
  Administered 2018-09-02: 200 mg via INTRAVENOUS

## 2018-09-02 MED ORDER — EPHEDRINE SULFATE 50 MG/ML IJ SOLN
INTRAMUSCULAR | Status: DC | PRN
  Administered 2018-09-02: 5 mg via INTRAVENOUS
  Administered 2018-09-02: 10 mg via INTRAVENOUS
  Administered 2018-09-02: 5 mg via INTRAVENOUS
  Administered 2018-09-02: 10 mg via INTRAVENOUS
  Administered 2018-09-02 (×2): 5 mg via INTRAVENOUS

## 2018-09-02 MED ORDER — PROMETHAZINE HCL 25 MG/ML IJ SOLN: 6.2500 mg | INTRAMUSCULAR | Status: DC | PRN

## 2018-09-02 SURGICAL SUPPLY — 56 items
ADAPTER BRONCHOSCOPE OLYMPUS (ADAPTER) ×3 IMPLANT
ADAPTER VALVE BIOPSY EBUS (MISCELLANEOUS) IMPLANT
ADPR BSCP OLMPS EDG (ADAPTER) ×2
ADPTR VALVE BIOPSY EBUS (MISCELLANEOUS)
BRUSH BIOPSY BRONCH 10 SDTNB (MISCELLANEOUS) ×1 IMPLANT
BRUSH CYTOL CELLEBRITY 1.5X140 (MISCELLANEOUS) ×3 IMPLANT
BRUSH SUPERTRAX BIOPSY (INSTRUMENTS) IMPLANT
BRUSH SUPERTRAX NDL-TIP CYTO (INSTRUMENTS) ×4 IMPLANT
CANISTER SUCT 3000ML PPV (MISCELLANEOUS) ×3 IMPLANT
CHANNEL WORK EXTEND EDGE 180 (KITS) IMPLANT
CHANNEL WORK EXTEND EDGE 90 (KITS) IMPLANT
CONT SPEC 4OZ CLIKSEAL STRL BL (MISCELLANEOUS) ×5 IMPLANT
COVER BACK TABLE 60X90IN (DRAPES) ×3 IMPLANT
COVER WAND RF STERILE (DRAPES) ×3 IMPLANT
DRSG EMULSION OIL 3X3 NADH (GAUZE/BANDAGES/DRESSINGS) ×1 IMPLANT
DRSG OPSITE POSTOP 4X8 (GAUZE/BANDAGES/DRESSINGS) ×1 IMPLANT
FILTER STRAW FLUID ASPIR (MISCELLANEOUS) IMPLANT
FORCEPS BIOP RJ4 1.8 (CUTTING FORCEPS) IMPLANT
FORCEPS BIOP SUPERTRX PREMAR (INSTRUMENTS) ×4 IMPLANT
GAUZE SPONGE 4X4 12PLY STRL (GAUZE/BANDAGES/DRESSINGS) ×3 IMPLANT
GLOVE SURG SS PI 7.5 STRL IVOR (GLOVE) ×4 IMPLANT
GOWN STRL REUS W/ TWL LRG LVL3 (GOWN DISPOSABLE) ×4 IMPLANT
GOWN STRL REUS W/TWL LRG LVL3 (GOWN DISPOSABLE) ×6
KIT CLEAN ENDO COMPLIANCE (KITS) ×6 IMPLANT
KIT LOCATABLE GUIDE (CANNULA) IMPLANT
KIT MARKER FIDUCIAL DELIVERY (KITS) IMPLANT
KIT PROCEDURE EDGE 180 (KITS) ×1 IMPLANT
KIT PROCEDURE EDGE 90 (KITS) IMPLANT
KIT TURNOVER KIT B (KITS) ×3 IMPLANT
MARKER SKIN DUAL TIP RULER LAB (MISCELLANEOUS) ×3 IMPLANT
NDL ASPIRATION VIZISHOT 19G (NEEDLE) IMPLANT
NDL ASPIRATION VIZISHOT 21G (NEEDLE) IMPLANT
NDL SUPERTRX PREMARK BIOPSY (NEEDLE) ×2 IMPLANT
NEEDLE ASPIRATION VIZISHOT 19G (NEEDLE) IMPLANT
NEEDLE ASPIRATION VIZISHOT 21G (NEEDLE) IMPLANT
NEEDLE SUPERTRX PREMARK BIOPSY (NEEDLE) ×3 IMPLANT
NS IRRIG 1000ML POUR BTL (IV SOLUTION) ×3 IMPLANT
OIL SILICONE PENTAX (PARTS (SERVICE/REPAIRS)) ×3 IMPLANT
PAD ARMBOARD 7.5X6 YLW CONV (MISCELLANEOUS) ×6 IMPLANT
PATCHES PATIENT (LABEL) ×9 IMPLANT
STOPCOCK 4 WAY LG BORE MALE ST (IV SETS) ×3 IMPLANT
SUT ETHILON 3 0 PS 1 (SUTURE) ×2 IMPLANT
SYR 20CC LL (SYRINGE) ×3 IMPLANT
SYR 20ML ECCENTRIC (SYRINGE) ×9 IMPLANT
SYR 3ML LL SCALE MARK (SYRINGE) IMPLANT
SYR 50ML SLIP (SYRINGE) ×3 IMPLANT
SYR 5ML LL (SYRINGE) ×3 IMPLANT
TOWEL OR 17X24 6PK STRL BLUE (TOWEL DISPOSABLE) ×3 IMPLANT
TRAP SPECIMEN MUCOUS 40CC (MISCELLANEOUS) IMPLANT
TRAY WAYNE PNEUMOTHORAX 14X18 (TRAY / TRAY PROCEDURE) ×1 IMPLANT
TUBE CONNECTING 20X1/4 (TUBING) ×3 IMPLANT
UNDERPAD 30X30 (UNDERPADS AND DIAPERS) ×3 IMPLANT
VALVE BIOPSY  SINGLE USE (MISCELLANEOUS) ×1
VALVE BIOPSY SINGLE USE (MISCELLANEOUS) ×2 IMPLANT
VALVE SUCTION BRONCHIO DISP (MISCELLANEOUS) ×3 IMPLANT
WATER STERILE IRR 1000ML POUR (IV SOLUTION) ×3 IMPLANT

## 2018-09-02 NOTE — Anesthesia Procedure Notes (Signed)
Procedure Name: Intubation Date/Time: 09/02/2018 8:38 AM Performed by: Shakeera Rightmyer T, CRNA Pre-anesthesia Checklist: Patient identified, Emergency Drugs available, Suction available and Patient being monitored Patient Re-evaluated:Patient Re-evaluated prior to induction Oxygen Delivery Method: Circle system utilized Preoxygenation: Pre-oxygenation with 100% oxygen Induction Type: IV induction Ventilation: Mask ventilation without difficulty Laryngoscope Size: Miller and 3 Grade View: Grade I Tube type: Oral Tube size: 9.0 mm Number of attempts: 1 Airway Equipment and Method: Patient positioned with wedge pillow,  Stylet and LTA kit utilized Placement Confirmation: ETT inserted through vocal cords under direct vision,  positive ETCO2 and breath sounds checked- equal and bilateral Secured at: 22 cm Tube secured with: Tape Dental Injury: Teeth and Oropharynx as per pre-operative assessment

## 2018-09-02 NOTE — Transfer of Care (Signed)
Immediate Anesthesia Transfer of Care Note  Patient: Sean Rose  Procedure(s) Performed: VIDEO BRONCHOSCOPY WITH ENDOBRONCHIAL NAVIGATION (N/A ) Chest Tube Insertion (Right Chest)  Patient Location: PACU  Anesthesia Type:General  Level of Consciousness: awake, alert  and oriented  Airway & Oxygen Therapy: Patient Spontanous Breathing and Patient connected to nasal cannula oxygen  Post-op Assessment: Report given to RN, Post -op Vital signs reviewed and stable and Patient moving all extremities  Post vital signs: Reviewed and stable  Last Vitals:  Vitals Value Taken Time  BP 127/76 09/02/2018 11:07 AM  Temp    Pulse 82 09/02/2018 11:10 AM  Resp 14 09/02/2018 11:10 AM  SpO2 97 % 09/02/2018 11:10 AM  Vitals shown include unvalidated device data.  Last Pain:  Vitals:   09/02/18 0717  PainSc: 0-No pain      Patients Stated Pain Goal: 5 (37/54/36 0677)  Complications: No apparent anesthesia complications

## 2018-09-02 NOTE — Interval H&P Note (Signed)
History and Physical Interval Note:  09/02/2018 8:23 AM  Sean Rose  has presented today for surgery, with the diagnosis of upper lobe multiple right lung nodules  The various methods of treatment have been discussed with the patient and family. After consideration of risks, benefits and other options for treatment, the patient has consented to  Procedure(s): VIDEO BRONCHOSCOPY WITH ENDOBRONCHIAL NAVIGATION (N/A) VIDEO BRONCHOSCOPY WITH ENDOBRONCHIAL ULTRASOUND (N/A) as a surgical intervention .  The patient's history has been reviewed, patient examined, no change in status, stable for surgery.  I have reviewed the patient's chart and labs.  Questions were answered to the patient's satisfaction.    Patient seen and examined in pre-op. Patients wife present. All questions answered. No barriers to proceed.   Garner Nash, DO [facility] 09/02/2018 8:24 AM  Personal pager: [phone] If unanswered, please page CCM On-call: [phone]

## 2018-09-02 NOTE — Addendum Note (Signed)
Addendum  created 09/02/18 1341 by Ollen Bowl, CRNA   Charge Capture section accepted

## 2018-09-02 NOTE — Op Note (Signed)
Video Bronchoscopy with Electromagnetic Navigation Procedure Note  Date of Operation: 09/02/2018  Pre-op Diagnosis: Multiple right upper lobe pulmonary nodules  Post-op Diagnosis: Multiple right upper lobe pulmonary nodules  Surgeon: Garner Nash, DO  Assistants: None  Anesthesia: General endotracheal anesthesia  Operation: Flexible video fiberoptic bronchoscopy with electromagnetic navigation and biopsies.  Estimated Blood Loss: Minimal, <8HW   Complications: Right sided pneumothorax   Indications and History: Sean Rose is a 63 y.o. male with multiple right upper lobe pulmonary nodules, PET avid concerning for potential bronchogenic carcinoma.  The risks, benefits, complications, treatment options and expected outcomes were discussed with the patient.  The possibilities of pneumothorax, pneumonia, reaction to medication, pulmonary aspiration, perforation of a viscus, bleeding, failure to diagnose a condition and creating a complication requiring transfusion or operation were discussed with the patient who freely signed the consent.    Description of Procedure: The patient was seen in the Preoperative Area, was examined and was deemed appropriate to proceed.  The patient was taken to [facility] OR 10, identified as [name] and the procedure verified as Flexible Video Fiberoptic Bronchoscopy.  A Time Out was held and the above information confirmed.   Prior to the date of the procedure a high-resolution CT scan of the chest was performed. Utilizing [employer] a virtual tracheobronchial tree was generated to allow the creation of distinct navigation pathways to the patient's parenchymal abnormalities.  Navigation pathways were planned for 3 separate targets to include a more central mid upper lung zone nodule as target 1, target 2 was the peripheral large spiculated nodule and target 3 was the posterior nodule along the fissure line predominantly groundglass in nature. After  being taken to the operating room general anesthesia was initiated and the patient  was orally intubated. The video fiberoptic bronchoscope was introduced via the endotracheal tube and a general inspection was performed which showed bronchiectatic openings in the airways, distal pitting, no endobronchial lesion identified. The extendable working channel and locator guide were introduced into the bronchoscope. The distinct navigation pathways prepared prior to this procedure were then utilized to navigate to within 0.5 mm of patient's lesion(s) identified on CT scan.  We started with target 1. The extendable working channel was secured into place and the locator guide was withdrawn. Under fluoroscopic guidance transbronchial needle brushings, transbronchial Wang needle biopsies, and transbronchial forceps biopsies were performed to be sent for cytology and pathology. A bronchioalveolar lavage was performed in the right upper lobe and sent for cytology and microbiology (bacterial, fungal, AFB smears and cultures).  At this point we repeated the process for target #2.  With initial navigation to target #2 we were able to navigate to the peripheral spiculated nodule.  We were able to obtain needle brush samples followed by forcep biopsy samples.  Following the force of biopsy samples we attempted to re-navigate through the locatable guide.  We were unable to re-navigate to the position and under fluoroscopy was able to identify the early start to collapse of the right lung with the development of a lateral pleural line consistent with a secondary right-sided pneumothorax.  Once the right-sided pneumothorax was identified under fluoroscopy the bronchoscopic portion of the procedure was terminated and a second procedure was started to include 14 French pigtail catheter placement under fluoroscopic guidance.  The right chest at the level of the nipple line was cleaned sterilely with chlorhexidine.  A sterile drape  was used cover the right chest.  An 18-gauge needle was  inserted into the right chest with air withdrawal.  A wire was placed into the chest and under routine Seldinger technique a dilator was passed between the rib space and a 14 French pigtail catheter was inserted.  This was immediately viewed under fluoroscopy.  The 81 French pigtail catheter was then attached to a atrium and placed to suction.  There was immediate air return into the atrium.  And with breaths did have a present air leak.  At the end of the procedure a general airway inspection was performed and there was no evidence of active bleeding. The bronchoscope was removed.  The patient tolerated the procedure well. There was no significant blood loss. A post-procedural chest x-ray is pending to ensure adequate placement of 14 French pigtail catheter.  Samples: 1. Transbronchial needle brushings from right upper lobe target 1 2. Transbronchial Wang needle biopsies from right upper lobe target 1 3. Transbronchial forceps biopsies from right upper lobe target 1 4. Bronchoalveolar lavage from right upper lobe 5.  Transbronchial needle brushings from the right upper lobe target 2 6.  Transbronchial forcep biopsies from right upper lobe target 2  Plans:  The patient will be discharged from the PACU to home when recovered from anesthesia and after chest x-ray is reviewed. We will review the cytology, pathology and microbiology results with the patient when they become available.  Patient will be admitted to the hospital under pulmonary service for management of the right chest tube.   Garner Nash, DO [facility] 09/02/2018 10:59 AM  Personal pager: [phone] If unanswered, please page CCM On-call: [phone]

## 2018-09-02 NOTE — Anesthesia Postprocedure Evaluation (Signed)
Anesthesia Post Note  Patient: Sean Rose  Procedure(s) Performed: VIDEO BRONCHOSCOPY WITH ENDOBRONCHIAL NAVIGATION (N/A ) Chest Tube Insertion (Right Chest)     Patient location during evaluation: PACU Anesthesia Type: General Level of consciousness: awake and alert Pain management: pain level controlled Vital Signs Assessment: post-procedure vital signs reviewed and stable Respiratory status: spontaneous breathing, nonlabored ventilation, respiratory function stable and patient connected to nasal cannula oxygen Cardiovascular status: blood pressure returned to baseline and stable Postop Assessment: no apparent nausea or vomiting Anesthetic complications: no    Last Vitals:  Vitals:   09/02/18 1255 09/02/18 1300  BP: 117/71   Pulse: 77 76  Resp: 15 14  Temp:    SpO2: 96% 95%    Last Pain:  Vitals:   09/02/18 1300  PainSc: Asleep                 Nicolina Hirt P Laken [place]

## 2018-09-02 NOTE — H&P (Signed)
NAME:  Sean Rose, MRN:  [id number], DOB:  [birth year], LOS: 0 ADMISSION DATE:  09/02/2018, CONSULTATION DATE:  2/5 REFERRING MD:  Dr. Valeta Harms, CHIEF COMPLAINT:  Post Op PTX    Brief History   63 year old with pulmonary nodules who suffered iatrogenic pneumothorax s/p navigational bronch 2/5.   History of present illness   63 year old male with PMH as below, which is significant for tobacco abuse, COPD, and GERD. Recently suffered a syncopal episode and went to ED. CT of the chest was done and discovered RUL nodules and he was referred to pulmonary. See Dr. Valeta Harms office note from 08/11/18. Briefly, PET showed multiple upper lobe avid lesions. He presented for navigational bronchoscopy procedure 2/5 and had multiple biopsies taken from two different sites. Post operative imaging demonstrated R sided pneumothorax. Chest tube was placed in OR by Dr. Valeta Harms and the patient was then admitted.   Past Medical History   has a past medical history of Anginal pain ([place]), CAD (coronary artery disease), CAP (community acquired pneumonia) (09/2016), COPD (chronic obstructive pulmonary disease) ([place]), GERD (gastroesophageal reflux disease), Heart murmur, Hemorrhoids, History of anal fissures, Hyperlipidemia, Hypertension, Peripheral arterial disease ([place]), Seasonal allergies, and Tobacco abuse.  Significant Hospital Events   2/5 admitted for post-procedural pneumothorax  Consults:    Procedures:  Chest tube 2/5 >   Significant Diagnostic Tests:  Navigational Bronchoscopy 2/5 >  Micro Data:    Antimicrobials:     Interim history/subjective:    Objective   Blood pressure 139/78, pulse 64, temperature 98.3 F (36.8 C), resp. rate 20, height '5\' 8"'  (1.727 m), weight 59 kg, SpO2 100 %.        Intake/Output Summary (Last 24 hours) at 09/02/2018 1103 Last data filed at 09/02/2018 1051 Gross per 24 hour  Intake 1200 ml  Output 10 ml  Net 1190 ml   Filed Weights   09/02/18 0644  Weight: 59 kg     Examination: General appearance: 63 y.o., male, NAD, conversant  Eyes: anicteric sclerae, moist conjunctivae; no lid-lag; PERRLA, tracking appropriately HENT: NCAT; oropharynx, MMM Neck: Trachea midline; FROM, supple, lymphadenopathy, no JVD Lungs: CTAB, no crackles, no wheeze, with normal respiratory effort and no intercostal retractions CV: RRR, S1, S2, no MRGs  Chest: right pigtail in place  Abdomen: Soft, non-tender; non-distended, BS present  Extremities: No peripheral edema, radial and DP pulses present bilaterally  Skin: Normal temperature, turgor and texture; no rash Psych: Appropriate affect Neuro: Alert and oriented to person and place, no focal deficit     Resolved Hospital Problem list     Assessment & Plan:   Right sided pneumothorax s/p navigational bronchoscopy for multiple PET avid RUL lung nodules in a long time smoker.  - Right pigtail catheter to suction - admit to hospital - prns for pain control  - repeat cxr in AM  - once air leak stops could consider trial on water seal  - if bx for pathology are negative for malignancy then could consider asking interventional radiology to consider percutaneous approach while pigtail is in place.   Best practice:  Diet: Regular Pain/Anxiety/Delirium protocol (if indicated): na VAP protocol (if indicated): na DVT prophylaxis: heparin, scds GI prophylaxis: na Glucose control: na Mobility: up in chair Code Status: full Family Communication: updated wife Disposition: hospital admit   Labs   CBC: Recent Labs  Lab 08/26/18 1332 09/02/18 0740  WBC 8.9  --   HGB 14.7 14.3  HCT 41.5 42.0  MCV 99.3  --   PLT 281  --     Basic Metabolic Panel: Recent Labs  Lab 08/26/18 1332 08/31/18 1416 09/02/18 0740  NA 126* 128* 133*  K 4.2 3.2* 3.5  CL 95* 95*  --   CO2 22 27  --   GLUCOSE 127* 124* 101*  BUN <5* 3*  --   CREATININE 0.58* 0.68  --   CALCIUM 8.9 8.8  --    GFR: Estimated Creatinine Clearance:  79.9 mL/min (by C-G formula based on SCr of 0.68 mg/dL). Recent Labs  Lab 08/26/18 1332  WBC 8.9    Liver Function Tests: Recent Labs  Lab 08/26/18 1332 08/31/18 1416  AST 27 24  ALT 14 15  ALKPHOS 49 56  BILITOT 0.5 0.3  PROT 6.6 6.7  ALBUMIN 3.6 4.0   No results for input(s): LIPASE, AMYLASE in the last 168 hours. No results for input(s): AMMONIA in the last 168 hours.  ABG No results found for: PHART, PCO2ART, PO2ART, HCO3, TCO2, ACIDBASEDEF, O2SAT   Coagulation Profile: Recent Labs  Lab 08/26/18 1332  INR 0.97    Cardiac Enzymes: No results for input(s): CKTOTAL, CKMB, CKMBINDEX, TROPONINI in the last 168 hours.  HbA1C: No results found for: HGBA1C  CBG: No results for input(s): GLUCAP in the last 168 hours.  Review of Systems:   14 pt ROS negative except for right chest pain at the insertion site of the pigtail catheter  Past Medical History  He,  has a past medical history of Anginal pain ([place]), CAD (coronary artery disease), CAP (community acquired pneumonia) (09/2016), COPD (chronic obstructive pulmonary disease) ([place]), GERD (gastroesophageal reflux disease), Heart murmur, Hemorrhoids, History of anal fissures, Hyperlipidemia, Hypertension, Peripheral arterial disease ([place]), Seasonal allergies, and Tobacco abuse.   Surgical History    Past Surgical History:  Procedure Laterality Date  . ANKLE SURGERY Left    "rebuilt it"  . ANTERIOR CRUCIATE LIGAMENT REPAIR Right   . CARDIAC CATHETERIZATION  09/23/2008   Continued medical therapy - may need GI evaluation in addition.  Marland Kitchen CARDIAC CATHETERIZATION  10/28/2007   Medical therapy recommended.  Marland Kitchen CARDIAC CATHETERIZATION  11/18/2006   In-stent restenosis RCA  (50% distal edge, 80% segmental mid, and 50-60% segmental proximal). Successful cutting balloon atherectomy using a [id number] cutting balloon. 3 inflations with atherectomy performed on mid and proximal portions resulting in reduction of 80% mid in-stent  restenosis to less than 20% residual and 50-60% segmental proximal to less than 20% residual without dissection.  Marland Kitchen CARDIAC CATHETERIZATION  02/26/2006   Severe stenosis in RCA. Stenting performed using IVUS. 3.5x20 Maverick balloon deployed at [employer]. Distal stent-a 4x28 Liberte stent-deployed 12atm 48sec, 12atm 31sec, 4atm 19sec. Mid stent-a 4x28 Liberte stent-deployed 14atm 45sec, 14atm 60sec, 14atm 44sec. Proximal stent-4x8 Liberte- 14atm 45sec,14atm 47sec, 16atm 43sec. Severely diseased segment then appeared TIMI-3 flow.  Marland Kitchen CARDIOVASCULAR STRESS TEST  11/17/2012   No significant ECG changes. Septal perfusion defect is new when complared to study from 2010. Abnormal myocardial perfusion imaging with a basal to mid perfusion suggestive of previous MI.  Marland Kitchen CAROTID DOPPLER  08/09/2011   Bilateral Bulb/Proximal ICA - demonstrated a mild amount of fibrous plaque without evidence of significant diameter reduction reduction or other vascular abnormality.  . COLONOSCOPY     2003, 2014  . CORONARY ANGIOPLASTY WITH STENT PLACEMENT    . FEMORAL ARTERY STENT    . INGUINAL HERNIA REPAIR Right   . KNEE ARTHROSCOPY Right "multiple"  . LOWER  EXTREMITY ARTERIAL DOPPLER  01/31/2011   Bilateral ABIs-normal values with no suggestion of arterial insuff to the lower extremities at rest. Right CIA stent-mild amount of nonhemodynamically significant plaque is noted throughout  . 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 PERCUTANEOUS STENT INTERVENTION  04/04/2006 & 04/13/2015   a. Right common iliac artery with an 8.0x18 mm Herculink stent deployed at 12 atm. Stenosis was reduced from 80% to 0% with brisk flow. b. I-cast stenting to left common iliac artery  . PERIPHERAL VASCULAR CATHETERIZATION N/A 04/13/2015   Procedure: Lower Extremity Angiography;  Surgeon: Lorretta Harp, MD; L-oCIA 75%, 40-50% L-EIA, R-CIA stent patent, s/p 8 mm x 38 mm ICast covered stent>>0% stenosis in L-oCIA     . SHOULDER ARTHROSCOPY  WITH ROTATOR CUFF REPAIR Right   . TRANSTHORACIC ECHOCARDIOGRAM  11/26/2012   EF not noted. Aortic valve-sclerosis without stenosis, no regurgiation.   Marland Kitchen UPPER GASTROINTESTINAL ENDOSCOPY    . US CAROTID DOPPLER BILATERAL ([place] HX)  08/09/2011   Bilateral Bulb/Proximal ICAa demonstrated a mild amount of fibrous plaque without evidence of significant diameter reduction or any other vascular abnormality.     Social History   reports that he has been smoking cigarettes. He has a 9.50 pack-year smoking history. He has never used smokeless tobacco. He reports current alcohol use of about 36.0 standard drinks of alcohol per week. He reports that he does not use drugs.   Family History   His family history includes Colon cancer in his mother; Heart disease in his father and paternal grandfather.   Allergies Allergies  Allergen Reactions  . Oxycodone-Acetaminophen Nausea Only  . Tylox [Oxycodone-Acetaminophen] Nausea Only  . Tyloxapol Nausea Only     Home Medications  Prior to Admission medications   Medication Sig Start Date End Date Taking? Authorizing Provider  amLODipine (NORVASC) 5 MG tablet TAKE 1 TABLET(5 MG) BY MOUTH DAILY Patient taking differently: Take 5 mg by mouth daily.  07/07/18  Yes Lorretta Harp, MD  aspirin EC 81 MG tablet Take 81 mg by mouth daily.   Yes [provider]  clopidogrel (PLAVIX) 75 MG tablet TAKE 1 TABLET BY MOUTH EVERY DAY Patient taking differently: Take 75 mg by mouth daily.  04/10/18  Yes Lorretta Harp, MD  diphenhydrAMINE (BENADRYL) 25 mg capsule Take 25 mg by mouth daily as needed for allergies.    Yes [provider]  ibuprofen (ADVIL,MOTRIN) 200 MG tablet Take 400 mg by mouth every 6 (six) hours as needed for headache or moderate pain.    Yes [provider]  Magnesium 250 MG TABS Take 250 mg by mouth daily.    Yes [provider]  metoprolol succinate (TOPROL-XL) 100 MG 24 hr tablet TAKE 1 TABLET BY MOUTH EVERY  DAY Patient taking differently: Take 100 mg by mouth daily.  08/07/18  Yes Lorretta Harp, MD  Multiple Vitamin (MULTIVITAMIN WITH MINERALS) TABS tablet Take 1 tablet by mouth daily.   Yes [provider]  umeclidinium-vilanterol (ANORO ELLIPTA) 62.5-25 MCG/INH AEPB Inhale 1 puff into the lungs daily. 08/14/18  Yes Sabriel Borromeo, Octavio Graves, DO  nitroGLYCERIN (NITROSTAT) 0.4 MG SL tablet PLACE 1 TABLET UNDER THE TONGUE EVERY 5 MINUTES AS NEEDED FOR CHEST PAIN Patient taking differently: Place 0.4 mg under the tongue every 5 (five) minutes as needed for chest pain.  09/11/17   Lorretta Harp, MD     Critical care time:      Georgann Housekeeper, AGACNP-BC  [place] Pager [phone] or [phone]  09/02/2018 11:14 AM   Patient seen and examined post-op. Please see pre-op H&P as well as bronchoscopy procedure note for details of todays events.   Right chest drain in place Post-procedural pneumothorax Admit to hospital  Keep on suction Once airleak stops plan trial of water seal   Garner Nash, DO [facility] 09/02/2018 7:51 PM  Personal pager: [phone] If unanswered, please page CCM On-call: [phone]

## 2018-09-03 ENCOUNTER — Inpatient Hospital Stay (HOSPITAL_COMMUNITY): Payer: BLUE CROSS/BLUE SHIELD

## 2018-09-03 ENCOUNTER — Encounter (HOSPITAL_COMMUNITY): Payer: Self-pay | Admitting: Pulmonary Disease

## 2018-09-03 DIAGNOSIS — R918 Other nonspecific abnormal finding of lung field: Secondary | ICD-10-CM

## 2018-09-03 DIAGNOSIS — J939 Pneumothorax, unspecified: Secondary | ICD-10-CM

## 2018-09-03 LAB — BASIC METABOLIC PANEL
ANION GAP: 9 (ref 5–15)
BUN: <5 mg/dL — ABNORMAL LOW (ref 8–23)
CO2: 26 mmol/L (ref 22–32)
Calcium: 8.4 mg/dL — ABNORMAL LOW (ref 8.9–10.3)
Chloride: 95 mmol/L — ABNORMAL LOW (ref 98–111)
Creatinine, Ser: 0.62 mg/dL (ref 0.61–1.24)
GFR calc Af Amer: >60 mL/min (ref >60)
GFR calc non Af Amer: >60 mL/min (ref >60)
GLUCOSE: 121 mg/dL — AB (ref 70–99)
Potassium: 3.5 mmol/L (ref 3.5–5.1)
Sodium: 130 mmol/L — ABNORMAL LOW (ref 135–145)

## 2018-09-03 LAB — ACID FAST SMEAR (AFB, MYCOBACTERIA)
Acid Fast Smear: NEGATIVE
Acid Fast Smear: NEGATIVE

## 2018-09-03 LAB — ACID FAST SMEAR (AFB)

## 2018-09-03 LAB — HIV ANTIBODY (ROUTINE TESTING W REFLEX): HIV Screen 4th Generation wRfx: NONREACTIVE

## 2018-09-03 MED ORDER — ALUM & MAG HYDROXIDE-SIMETH 200-200-20 MG/5ML PO SUSP
15.0000 mL | Freq: Four times a day (QID) | ORAL | Status: DC | PRN
  Administered 2018-09-03 (×2): 15 mL via ORAL
  Filled 2018-09-03 (×2): qty 30

## 2018-09-03 MED ORDER — ONDANSETRON HCL 4 MG/2ML IJ SOLN
4.0000 mg | Freq: Three times a day (TID) | INTRAMUSCULAR | Status: DC | PRN
  Administered 2018-09-03: 4 mg via INTRAVENOUS
  Filled 2018-09-03: qty 2

## 2018-09-03 MED ORDER — LOPERAMIDE HCL 2 MG PO CAPS
2.0000 mg | ORAL_CAPSULE | ORAL | Status: DC | PRN
  Administered 2018-09-03: 2 mg via ORAL
  Filled 2018-09-03: qty 1

## 2018-09-03 MED ORDER — MAGNESIUM LACTATE 84 MG (7MEQ) PO TBCR: 168.0000 mg | EXTENDED_RELEASE_TABLET | Freq: Every day | ORAL | Status: DC

## 2018-09-03 MED ORDER — NON FORMULARY: 40.0000 mg | Freq: Two times a day (BID) | Status: DC

## 2018-09-03 MED ORDER — NONFORMULARY OR COMPOUNDED ITEM
40.0000 mg | Freq: Two times a day (BID) | Status: DC
  Administered 2018-09-03 – 2018-09-07 (×6): 40 mg via ORAL
  Filled 2018-09-03 (×11): qty 1

## 2018-09-03 MED ORDER — NITROGLYCERIN 0.4 MG SL SUBL: 0.4000 mg | SUBLINGUAL_TABLET | SUBLINGUAL | Status: DC | PRN

## 2018-09-03 MED ORDER — AMLODIPINE BESYLATE 5 MG PO TABS
5.0000 mg | ORAL_TABLET | Freq: Every day | ORAL | Status: DC
  Administered 2018-09-03 – 2018-09-24 (×22): 5 mg via ORAL
  Filled 2018-09-03 (×22): qty 1

## 2018-09-03 MED ORDER — ASPIRIN EC 81 MG PO TBEC
81.0000 mg | DELAYED_RELEASE_TABLET | Freq: Every day | ORAL | Status: DC
  Administered 2018-09-03: 81 mg via ORAL
  Filled 2018-09-03: qty 1

## 2018-09-03 MED ORDER — KETOROLAC TROMETHAMINE 15 MG/ML IJ SOLN
15.0000 mg | Freq: Three times a day (TID) | INTRAMUSCULAR | Status: AC
  Administered 2018-09-03 – 2018-09-07 (×15): 15 mg via INTRAVENOUS
  Filled 2018-09-03 (×15): qty 1

## 2018-09-03 MED ORDER — BENZONATATE 100 MG PO CAPS
200.0000 mg | ORAL_CAPSULE | Freq: Three times a day (TID) | ORAL | Status: DC | PRN
  Administered 2018-09-03: 200 mg via ORAL
  Filled 2018-09-03: qty 2

## 2018-09-03 MED ORDER — CLOPIDOGREL BISULFATE 75 MG PO TABS
75.0000 mg | ORAL_TABLET | Freq: Every day | ORAL | Status: DC
  Administered 2018-09-03: 75 mg via ORAL
  Filled 2018-09-03: qty 1

## 2018-09-03 MED ORDER — METOCLOPRAMIDE HCL 5 MG/ML IJ SOLN: 5.0000 mg | Freq: Three times a day (TID) | INTRAMUSCULAR | Status: DC | PRN

## 2018-09-03 MED ORDER — OXYCODONE HCL 5 MG PO TABS: 5.0000 mg | ORAL_TABLET | ORAL | Status: DC | PRN

## 2018-09-03 MED ORDER — METOPROLOL SUCCINATE ER 100 MG PO TB24
100.0000 mg | ORAL_TABLET | Freq: Every day | ORAL | Status: DC
  Administered 2018-09-03 – 2018-09-24 (×22): 100 mg via ORAL
  Filled 2018-09-03 (×23): qty 1

## 2018-09-03 MED ORDER — PANTOPRAZOLE SODIUM 40 MG PO TBEC
80.0000 mg | DELAYED_RELEASE_TABLET | Freq: Two times a day (BID) | ORAL | Status: DC
  Administered 2018-09-03: 80 mg via ORAL
  Filled 2018-09-03: qty 2

## 2018-09-03 MED ORDER — HYDROCOD POLST-CPM POLST ER 10-8 MG/5ML PO SUER
5.0000 mL | Freq: Two times a day (BID) | ORAL | Status: DC
  Administered 2018-09-04 – 2018-09-14 (×22): 5 mL via ORAL
  Filled 2018-09-03 (×24): qty 5

## 2018-09-03 MED ORDER — NICOTINE 21 MG/24HR TD PT24
21.0000 mg | MEDICATED_PATCH | Freq: Every day | TRANSDERMAL | Status: DC
  Administered 2018-09-03 – 2018-09-14 (×12): 21 mg via TRANSDERMAL
  Filled 2018-09-03 (×12): qty 1

## 2018-09-03 MED ORDER — MAGNESIUM OXIDE 400 (241.3 MG) MG PO TABS
200.0000 mg | ORAL_TABLET | Freq: Every day | ORAL | Status: DC
  Administered 2018-09-03 – 2018-09-14 (×12): 200 mg via ORAL
  Filled 2018-09-03 (×12): qty 1

## 2018-09-03 MED ORDER — GUAIFENESIN ER 600 MG PO TB12
1200.0000 mg | ORAL_TABLET | Freq: Two times a day (BID) | ORAL | Status: DC
  Administered 2018-09-03 – 2018-09-24 (×40): 1200 mg via ORAL
  Filled 2018-09-03 (×43): qty 2

## 2018-09-03 MED ORDER — LIDOCAINE 5 % EX PTCH
1.0000 | MEDICATED_PATCH | CUTANEOUS | Status: DC
  Administered 2018-09-03 – 2018-09-05 (×3): 1 via TRANSDERMAL
  Filled 2018-09-03 (×8): qty 1

## 2018-09-03 MED ORDER — ALPRAZOLAM 0.25 MG PO TABS
0.2500 mg | ORAL_TABLET | Freq: Three times a day (TID) | ORAL | Status: DC | PRN
  Administered 2018-09-03 – 2018-09-20 (×10): 0.25 mg via ORAL
  Filled 2018-09-03 (×11): qty 1

## 2018-09-03 MED ORDER — UMECLIDINIUM-VILANTEROL 62.5-25 MCG/INH IN AEPB
1.0000 | INHALATION_SPRAY | Freq: Every day | RESPIRATORY_TRACT | Status: DC
  Administered 2018-09-04 – 2018-09-23 (×20): 1 via RESPIRATORY_TRACT
  Filled 2018-09-03 (×3): qty 14

## 2018-09-03 NOTE — Progress Notes (Signed)
She did receive one dose of plavix and asa -it is not clear that we will have sufficient sample from path to be diagnostic (still waiting) I spoke to IR  Tentative plan Needle bx the right anterior upper lobe nodule if our path not adequate while he has the chest tube in.  -I alerted the IR team he did in fact receive one dose of asa and plavix.   Erick Colace ACNP-BC [place] Pager # [phone] OR # [phone] if no answer

## 2018-09-03 NOTE — Progress Notes (Signed)
   NAME:  Sean Rose, MRN:  [id number], DOB:  27-May-1956, LOS: 1 ADMISSION DATE:  09/02/2018, CONSULTATION DATE:  2/5 REFERRING MD:  Dr. Valeta Harms, CHIEF COMPLAINT:  Post Op PTX    Brief History   63 year old with pulmonary nodules who suffered iatrogenic pneumothorax s/p navigational bronch 2/5.   Significant Hospital Events   2/5 admitted for post-procedural pneumothorax 2/6: Still with significant 1-2 out of 7 airleak.  Small apical pneumothorax on the right persists Consults:    Procedures:  Chest tube 2/5 >   Significant Diagnostic Tests:  Navigational Bronchoscopy 2/5 >  Micro Data:    Antimicrobials:     Interim history/subjective:  Had nausea and vomiting last night.  He relates this to probable reflux which she has had in the past  Objective   Blood pressure (Abnormal) 150/80, pulse 62, temperature 97.8 F (36.6 C), temperature source Oral, resp. rate 20, height 5\' 8"  (1.727 m), weight 58.6 kg, SpO2 95 %.        Intake/Output Summary (Last 24 hours) at 09/03/2018 1115 Last data filed at 09/03/2018 5597 Gross per 24 hour  Intake 440 ml  Output 800 ml  Net -360 ml   Filed Weights   09/02/18 0644 09/02/18 1519  Weight: 59 kg 58.6 kg    Examination:  General: This is a very pleasant 63 year old white male is currently resting in bed and in no acute distress HEENT normocephalic atraumatic no jugular venous distention Pulmonary: Clear to auscultation bilaterally.  The right lateral chest tube is in place.  He is currently at 20 cm wall suction and exhibits a 1-2 out of 7 airleak still Cardiac: Regular rate and rhythm Abdomen: Soft nontender no organomegaly Extremities: Brisk capillary refill no edema strong pulses Neuro: Awake oriented no focal deficits.    Resolved Hospital Problem list     Assessment & Plan:   Right sided pneumothorax s/p navigational bronchoscopy for multiple PET avid RUL lung nodules in a long time smoker.  -Status post chest x-ray  obtained on 2/6 after chest tube placement shows reduced volume of pneumothorax estimating about 5%.  No significant change from prior day -Continues to have air leak Plan Keep chest tube to suction Repeat a.m. chest x-ray  Hypertension and history of coronary artery disease Plan Resuming home antihypertensives and Plavix  Nausea and vomiting, history of severe GERD Plan Resume home PPI we will add this for twice daily for now  Mild Leukocytosis; no fever   Plan Am cbc Trend fever curve  Best practice:  Diet: Regular Pain/Anxiety/Delirium protocol (if indicated): na VAP protocol (if indicated): na DVT prophylaxis: heparin, scds GI prophylaxis: na Glucose control: na Mobility: up in chair Code Status: full Family Communication: updated wife Disposition: Keep in hospital for ongoing pneumothorax   Erick Colace ACNP-BC [place] Pager # [phone] OR # [phone] if no answer

## 2018-09-03 NOTE — Progress Notes (Addendum)
PCCM INTERVAL PROGRESS NOTE  Called to bedside to evaluate patient after near syncopal event. The patient reports intermittent dyspnea and coughing spells. Wife was bedside when the patient began to get a little dizzy and she went to get RN. RN reports that he was "out of it" and she was worried he may quickly decompensate and called a code blue. Fortunately he snapped out of it and returned to his baseline.  Upon my arrival the patient is alert and oriented. Does not remember the episode. Wife says he has been getting very anxious and agitated due to SOB, coughing, and removal from his everyday routine. He even reports that he has been very anxious.   On exam he has clear breath sounds, however, he has some subcutaneous emphysema on exam over his right anterior chest. I do not recall him having this when I saw him yesterday. Also his air leak on the pleuravac is 0 and was 2 earlier today, raising concern that perhaps it has become displaced.   Plan: CXR If CXR is ok nurse will give PRN xanax Will check EKG and BMP  Update: CXR shows much larger PTX with chest tube remaining in place.   I went back to bedside and ran the tubing. On the most proximal portion of the chest tube tubing, just distal to the stopcock, there was a kink. I opened this kink and could immediately feel air running through the tubing.   Suspect this got kinked from him standing and turning to use the bedside commode so frequently. Will add immodium.    Georgann Housekeeper, AGACNP-BC [place] Pager [phone] or [phone]  09/03/2018 5:00 PM

## 2018-09-03 NOTE — Progress Notes (Signed)
eLink Physician-Brief Progress Note Patient Name: Sean Rose DOB: 1956-07-19 MRN: [id number]   Date of Service  09/03/2018  HPI/Events of Note  Pt is requesting an antacid. He prefers a liquid.  eICU Interventions  Maalox ordered.        Kerry Kass [place] 09/03/2018, 4:17 AM

## 2018-09-03 NOTE — Progress Notes (Signed)
Pt reported increased dyspnea and anxiety due to this a worsening cough.  RN reported findings to [facility] NP. Cough medication, anxiety medication, and nicotine patch ordered. RN went to obtain meds from pyxis. Pt attempted to use BSC. Once sitting back in bed, another RN (Trish) noticed pt appeared to be losing consciousness and leaning back in bed. Trish RN performed sternal rub. Pt slowly regained conciousness. RN returned to room as pt did so.  He reported not remembering going to [facility], but that he had coughed up a small amount of blood. Vital signs taken (they were stable - no changes from last set). Tele reported no irregular rhythms in pt during time of event. Pt's HR had stayed in 50's/60's. RN paged MD and reported findings.   RN will continue to monitor patient.

## 2018-09-04 ENCOUNTER — Inpatient Hospital Stay (HOSPITAL_COMMUNITY): Payer: BLUE CROSS/BLUE SHIELD

## 2018-09-04 LAB — URINALYSIS, ROUTINE W REFLEX MICROSCOPIC
BILIRUBIN URINE: NEGATIVE
Glucose, UA: NEGATIVE mg/dL
Hgb urine dipstick: NEGATIVE
Ketones, ur: NEGATIVE mg/dL
Leukocytes, UA: NEGATIVE
Nitrite: NEGATIVE
Protein, ur: NEGATIVE mg/dL
Specific Gravity, Urine: 1.013 (ref 1.005–1.030)
pH: 6 (ref 5.0–8.0)

## 2018-09-04 LAB — CULTURE, RESPIRATORY W GRAM STAIN
Culture: NO GROWTH
Culture: NO GROWTH
Gram Stain: NONE SEEN

## 2018-09-04 LAB — CULTURE, RESPIRATORY

## 2018-09-04 NOTE — Progress Notes (Addendum)
NAME:  Sean Rose, MRN:  [id number], DOB:  [birth year], LOS: 2 ADMISSION DATE:  09/02/2018, CONSULTATION DATE:  2/5 REFERRING MD:  Dr. Valeta Harms, CHIEF COMPLAINT:  Post Op PTX    Brief History   63 year old with pulmonary nodules who suffered iatrogenic pneumothorax s/p navigational bronch 2/5.   Significant Hospital Events   2/5 admitted for post-procedural pneumothorax 2/6: Still with significant 1-2 out of 7 airleak.  2/7: Tiny apical pneumothorax on the right. No other focal abnormality is noted. Consults:    Procedures:  Chest tube 2/5 >   Significant Diagnostic Tests:  Navigational Bronchoscopy 2/5 >INFLAMED LUNG PARENCHYMA WITH HEMOSIDERIN DEPOSITION. - THERE IS NO EVIDENCE OF GRANULOMATA OR MALIGNANCY.  Micro Data:  2/5: Sputum Culture>> 2/5 : Sputum  AFB>> 2/5: Sputum fungus>>  Antimicrobials:     Interim history/subjective:  No further  nausea or vomiting over  night.  States he is very anxious to go home.  Objective   Blood pressure 130/85, pulse 60, temperature 98.3 F (36.8 C), temperature source Oral, resp. rate 17, height 5\' 8"  (1.727 m), weight 58.6 kg, SpO2 97 %.        Intake/Output Summary (Last 24 hours) at 09/04/2018 0855 Last data filed at 09/04/2018 0717 Gross per 24 hour  Intake -  Output 1100 ml  Net -1100 ml   Filed Weights   09/02/18 0644 09/02/18 1519  Weight: 59 kg 58.6 kg    Examination:  General:  pleasant 63 year old white male is  resting in bed and in no acute distress, on RA with sats of 97%. HEENT NCAT,  no JVD or LAD Pulmonary: Clear to auscultation bilaterally.  The right lateral chest tube is in place. It is patent. He is currently at 20 cm wall suction with  a 1-2 out of 7 airleak . Cardiac: S1, S2, RRR, No RMG noted Abdomen: Soft , non tender, ND, BS + no organomegaly, Body mass index is 19.64 kg/m. Extremities: Warm and dry, no mottling , Brisk capillary refill ,no edema strong pulses, no obvious deformities Neuro:  Awake, alert and  Oriented x 3,  no focal deficits.    Resolved Hospital Problem list     Assessment & Plan:   Right sided pneumothorax s/p navigational bronchoscopy for multiple PET avid RUL lung nodules in a long time smoker.  - CXR 2/7 shows a tiny apical pneumothorax on the right - Remains to 20 cm H2O suction,Continues to have air leak 1-2 of 7 chambers Plan Keep chest tube to suction Repeat  chest x-ray 2/8 Will have patient placed on oxygen to reduce partial pressure of alveolar nitrogen, and hopefully speed resolution of remaining pneumothorax.  Hypertension and history of coronary artery disease Plan Resuming home antihypertensives and Plavix  Nausea and vomiting, history of severe GERD Better last 24 hours Plan Resume home PPI we will add this for twice daily for now  Increased  Leukocytosis; no fever   Plan CBC 2/8 am  Trend fever curve Culture as is clinically indicated  Hyponatremia Plan: BMET in am Limit free water   Biopsy negative for malignancy or granulomata + for Inflamed lung parenchyma with hemosiderin deposition Plan: Refer to [facility] once pneumo has resolved for definitive R/O of malignancy Will check UA for blood Will order GBM antibodies with am draw   Will need close follow up with Pulmonary as an outpatient. Has Follow Up appointment scheduled for 2/11 at 10:30 am with Dr. Valeta Harms Will need follow up  with [facility] for further rule out of malignancy   Best practice:  Diet: Regular Pain/Anxiety/Delirium protocol (if indicated): na VAP protocol (if indicated): na DVT prophylaxis: heparin, scds GI prophylaxis: Home PPI Glucose control: na Mobility: up in chair Code Status: full Family Communication: updated patient, no other family at bedside Disposition: Remain  in hospital for ongoing pneumothorax   Magdalen Spatz, AGACNP-BC [place] Pager # [phone] After 4 pm call [phone] 09/04/2018 9:23  AM

## 2018-09-04 NOTE — Plan of Care (Signed)
  Problem: Education: Goal: Knowledge of General Education information will improve Description: Including pain rating scale, medication(s)/side effects and non-pharmacologic comfort measures Outcome: Progressing   Problem: Health Behavior/Discharge Planning: Goal: Ability to manage health-related needs will improve Outcome: Progressing   Problem: Nutrition: Goal: Adequate nutrition will be maintained Outcome: Progressing   

## 2018-09-05 ENCOUNTER — Inpatient Hospital Stay (HOSPITAL_COMMUNITY): Payer: BLUE CROSS/BLUE SHIELD

## 2018-09-05 LAB — BASIC METABOLIC PANEL
Anion gap: 11 (ref 5–15)
BUN: <5 mg/dL — ABNORMAL LOW (ref 8–23)
CO2: 29 mmol/L (ref 22–32)
Calcium: 8.5 mg/dL — ABNORMAL LOW (ref 8.9–10.3)
Chloride: 91 mmol/L — ABNORMAL LOW (ref 98–111)
Creatinine, Ser: 0.64 mg/dL (ref 0.61–1.24)
GFR calc Af Amer: >60 mL/min (ref >60)
GFR calc non Af Amer: >60 mL/min (ref >60)
GLUCOSE: 117 mg/dL — AB (ref 70–99)
Potassium: 3.1 mmol/L — ABNORMAL LOW (ref 3.5–5.1)
Sodium: 131 mmol/L — ABNORMAL LOW (ref 135–145)

## 2018-09-05 LAB — CBC
HCT: 39.8 % (ref 39.0–52.0)
Hemoglobin: 14.3 g/dL (ref 13.0–17.0)
MCH: 35.4 pg — ABNORMAL HIGH (ref 26.0–34.0)
MCHC: 35.9 g/dL (ref 30.0–36.0)
MCV: 98.5 fL (ref 80.0–100.0)
Platelets: 283 10*3/uL (ref 150–400)
RBC: 4.04 MIL/uL — ABNORMAL LOW (ref 4.22–5.81)
RDW: 12.5 % (ref 11.5–15.5)
WBC: 8.6 10*3/uL (ref 4.0–10.5)
nRBC: 0 % (ref 0.0–0.2)

## 2018-09-05 LAB — GLUCOSE, CAPILLARY: Glucose-Capillary: 138 mg/dL — ABNORMAL HIGH (ref 70–99)

## 2018-09-05 MED ORDER — LIDOCAINE HCL (PF) 1 % IJ SOLN
INTRAMUSCULAR | Status: AC
  Administered 2018-09-05: 21:00:00
  Filled 2018-09-05: qty 5

## 2018-09-05 MED ORDER — LIDOCAINE HCL (CARDIAC) PF 100 MG/5ML IV SOSY
PREFILLED_SYRINGE | INTRAVENOUS | Status: AC
  Filled 2018-09-05: qty 5

## 2018-09-05 MED ORDER — SODIUM CHLORIDE 0.9 % IV BOLUS
500.0000 mL | Freq: Once | INTRAVENOUS | Status: AC
  Administered 2018-09-05: 500 mL via INTRAVENOUS

## 2018-09-05 NOTE — Plan of Care (Signed)
  Problem: Education: Goal: Knowledge of General Education information will improve Description Including pain rating scale, medication(s)/side effects and non-pharmacologic comfort measures Outcome: Progressing   Problem: Health Behavior/Discharge Planning: Goal: Ability to manage health-related needs will improve Outcome: Progressing   Problem: Clinical Measurements: Goal: Respiratory complications will improve Outcome: Progressing   Problem: Activity: Goal: Risk for activity intolerance will decrease Outcome: Progressing   Problem: Nutrition: Goal: Adequate nutrition will be maintained Outcome: Progressing   Problem: Safety: Goal: Ability to remain free from injury will improve Outcome: Progressing   Problem: Pain Managment: Goal: General experience of comfort will improve Outcome: Progressing   Problem: Coping: Goal: Level of anxiety will decrease Outcome: Progressing

## 2018-09-05 NOTE — Progress Notes (Signed)
63 year old with pulmonary nodules who suffered iatrogenic pneumothorax s/p navigational bronch 2/5.  Biopsy results -both targets, nondiagnostic  Vitals:   09/05/18 0803 09/05/18 0810  BP: 136/78   Pulse: 62   Resp: 17   Temp: 97.7 F (36.5 C)   SpO2: 96% 98%   He feels good, no dyspnea or chest pain. On exam-mild chest wall crepitus unchanged from yesterday, decreased breath sounds on right, clear on left, S1-S2 normal Chest tube has intermittent air leak  Labs from 2/7 show mild hyponatremia and hypokalemia Chest x-ray 2/8 personally reviewed, pneumothorax appears slightly larger, chest tube has a smaller portion inside the chest now  Recommend -Continue chest tube to suction, repeat chest x-ray tomorrow. -Repeat bmet  He is getting frustrated since this is taking longer to resolve  Kara Mead MD. FCCP. [place] [facility] Pager [phone] If no response call [phone]   09/05/2018

## 2018-09-05 NOTE — Significant Event (Signed)
Rapid Response Event Note Called by Junie Panning RN for hypotension, hypoxia  Overview: Time Called: 2017 Arrival Time: 2020 Event Type: Hypotension, Respiratory  Initial Focused Assessment: Pt here with pneumothorax and pigtail CT. Upon arrival, Pt lying in bed in trendelenburg position.  Pt is drowsy but easily responds to verbal stimuli and is oriented x4. HR 73 SR, BP now 74/56 (62), RR 24, Sats 100% on NRB mask at 15L. Pt is SOB, mild increased WOB, no CP. Reportedly, pt was hypotensive with systolic BP in the 53G. Skin is warm, dry and pink.  He has a large amount of subcutaneous emphysema surrounding his existing CT. Georgann Housekeeper NP and Dr. Gilford Raid came to the bedside. Dr. Emmit Alexanders notified by phone prior to my arrival.  NS bolus infusing. Stat PCXR ordered and performed. Dr. Gilford Raid and Georgann Housekeeper at the bedside to replace right CT.  Event Summary: 2130- HR 69, BP 118/78 (91), RR 20, sats 100%  Name of Physician Notified: Dr. Emmit Alexanders notified by nursing staff at    at    Outcome: Stayed in room and stabalized  Event End Time: 2130  Madelynn Done

## 2018-09-05 NOTE — Plan of Care (Signed)

## 2018-09-05 NOTE — Progress Notes (Signed)
eLink Physician-Brief Progress Note Patient Name: MORAD TAL DOB: 06/05/1956 MRN: [id number]   Date of Service  09/05/2018  HPI/Events of Note  Hypotension and hypoxia - Now requiring [place] O2 in setting of pneumothorax s/p chest tube. Nursing has called a rapid response.   eICU Interventions  Will order: 1. Portable CXR STAT. 2. Bolus with 0.9 NaCl 500 mL IV over 30 minutes now.  3. Will request urgent evaluation by ground team.      Intervention Category Major Interventions: Hypotension - evaluation and management;Hypoxemia - evaluation and management  Lysle Dingwall 09/05/2018, 8:26 PM

## 2018-09-05 NOTE — Procedures (Addendum)
Chest Tube Insertion Procedure Note  Indications:  Clinically significant Pneumothorax Right  Pre-operative Diagnosis: Pneumothorax Right  Post-operative Diagnosis: Pneumothorax Right  Procedure Details  Informed consent was obtained for the procedure, including sedation. Tube was placed urgently as the patient was becoming hypotensive and CXR confirmed large pneumothorax. Risks of lung perforation, hemorrhage, arrhythmia, and adverse drug reaction were discussed.   After sterile skin prep, using standard technique, a 33 French pigatil tube was placed in the right anterior axillary 3rd rib space.  Findings: None  Estimated Blood Loss:  Minimal         Specimens:  None              Complications:  None; patient tolerated the procedure well.         Disposition: Pulmonary PCU         Condition: stable  Post op CXR with resolution of pneumothorax. No complications.   Georgann Housekeeper, AGACNP-BC [place] Pager [phone] or [phone]  09/05/2018 9:44 PM     \

## 2018-09-06 ENCOUNTER — Inpatient Hospital Stay (HOSPITAL_COMMUNITY): Payer: BLUE CROSS/BLUE SHIELD

## 2018-09-06 DIAGNOSIS — J95811 Postprocedural pneumothorax: Secondary | ICD-10-CM

## 2018-09-06 LAB — BASIC METABOLIC PANEL
Anion gap: 10 (ref 5–15)
BUN: 5 mg/dL — ABNORMAL LOW (ref 8–23)
CO2: 26 mmol/L (ref 22–32)
Calcium: 8.1 mg/dL — ABNORMAL LOW (ref 8.9–10.3)
Chloride: 92 mmol/L — ABNORMAL LOW (ref 98–111)
Creatinine, Ser: 0.79 mg/dL (ref 0.61–1.24)
GFR calc Af Amer: >60 mL/min (ref >60)
GFR calc non Af Amer: >60 mL/min (ref >60)
Glucose, Bld: 129 mg/dL — ABNORMAL HIGH (ref 70–99)
Potassium: 3.3 mmol/L — ABNORMAL LOW (ref 3.5–5.1)
Sodium: 128 mmol/L — ABNORMAL LOW (ref 135–145)

## 2018-09-06 MED ORDER — POTASSIUM CHLORIDE CRYS ER 20 MEQ PO TBCR
40.0000 meq | EXTENDED_RELEASE_TABLET | Freq: Once | ORAL | Status: AC
  Administered 2018-09-06: 40 meq via ORAL
  Filled 2018-09-06: qty 2

## 2018-09-06 NOTE — Progress Notes (Addendum)
63 year old with pulmonary nodules who suffered iatrogenic pneumothorax s/p navigational bronch 2/5.  Biopsy results -both targets, nondiagnostic  Chest tube fell out 2/8 with resultant tension pneumothorax and was reinserted.  Follow-up imaging shows reexpansion of the lung.  He feels back to baseline Denies pain or dyspnea. Came off oxygen ~Somewhat frustrated at his overall situation  Vitals:   09/06/18 0856 09/06/18 0913  BP:  125/73  Pulse: 61 (!) 59  Resp: 16 15  Temp:  98.1 F (36.7 C)  SpO2: 100% 98%   Exam-decreased breath sounds on right,  crepitus over anterior right chest wall up to midline, mild air leak on chest tube intermittent, S1-S2 normal, no edema  Labs show mild hyponatremia and hypokalemia  Impression-right pneumothorax postprocedure with persistent air leak  Recommend -Continue chest tube to suction, okay to give him a break to go to the bathroom for a few minutes of suction since this is so important for him  -Repeat chest x-ray tomorrow I reviewed the course and options for pneumothorax with persistent air leak for him.  Best to wait and watch to see if this heals over the next few days  Hypokalemia will be repleted  Rakesh V. Elsworth Soho MD [phone]

## 2018-09-06 NOTE — Progress Notes (Addendum)
Patient called at 20:00 c/o SOB, assessed patient and R sided chest tube, O2 91% on RA, placed on oxygen via nasal canula at 1L, O2 up to 94% on 1L, oxygen increased to [facility], chest tube intact to suction, lung sounds severely diminished on R side, crepitus noted to R neck, R anterior chest wall, R back, and R shoulder, crepitus outlined on patient, then patient began to feel lightheaded, stated he felt like he was going to pass out, BP checked 58/41 in R arm, 51/43 in L arm, Rapid response called, Dr. Oletta Darter paged and called back immediately, patient given NS bolus, STAT CXR done, critical care MD and NP at bedside to review CXR, placed on oxygen via 100% NRB, new R sided chest tube placed by NP and old one removed, patient was given Dilaudid 0.5mg  IV prior to new chest tube insertion, Xanax given after procedure for anxiety, patients vital signs returned to baseline, patient instructed to sit upright throughout night and keep 100% NRB on during night tonight, patient agreeable to plan tonight.

## 2018-09-07 ENCOUNTER — Inpatient Hospital Stay (HOSPITAL_COMMUNITY): Payer: BLUE CROSS/BLUE SHIELD

## 2018-09-07 LAB — GLOMERULAR BASEMENT MEMBRANE ANTIBODIES: GBM Ab: 3 units (ref 0–20)

## 2018-09-07 NOTE — Progress Notes (Signed)
   NAME:  Sean Rose, MRN:  [id number], DOB:  April 23, 1956, LOS: 5 ADMISSION DATE:  09/02/2018, CONSULTATION DATE:  2/5 REFERRING MD:  Dr. Valeta Harms, CHIEF COMPLAINT:  Post Op PTX    Brief History   63 year old with pulmonary nodules who suffered iatrogenic pneumothorax s/p navigational bronch 2/5.   Significant Hospital Events   2/5 admitted for post-procedural pneumothorax 2/6 Still with significant 1-2 out of 7 airleak.   Consults:    Procedures:  Chest tube 2/5 >>   Significant Diagnostic Tests:  Navigational Bronchoscopy 2/5 >> Transbronchial needle aspiration specimen D, navigation RUL nodule #2 brushing >> malignant cells present consistent with non-small cell carcinoma, features slightly favor adenocarcinoma.  There is insufficient material present for additional studies   Micro Data:  2/5 Sputum Culture >> 2/5 Sputum  AFB >> neg smear >>  2/5 Sputum fungus >>  Antimicrobials:     Interim history/subjective:  Pt voices frustration over series of events.    Objective   Blood pressure 132/69, pulse 65, temperature 98.2 F (36.8 C), temperature source Oral, resp. rate 12, height 5\' 8"  (1.727 m), weight 58.6 kg, SpO2 95 %.        Intake/Output Summary (Last 24 hours) at 09/07/2018 1255 Last data filed at 09/07/2018 0630 Gross per 24 hour  Intake 960 ml  Output 964 ml  Net -4 ml   Filed Weights   09/02/18 0644 09/02/18 1519  Weight: 59 kg 58.6 kg    Examination: General: adult male sitting up in bed, wife at bedside    HEENT: MM pink/moist, no jvd Neuro: AAOx4, speech clear CV: s1s2 rrr, no m/r/g PULM: even/non-labored, lungs bilaterally with good air movement, sub-cutaneous air on R, right chest tube with 3/7 air leak with deep inspiration  WY:OVZC, non-tender, bsx4 active  Extremities: warm/dry, no edema  Skin: no rashes or lesions  Resolved Hospital Problem list     Assessment & Plan:   Right sided pneumothorax s/p navigational bronchoscopy for multiple  PET avid RUL lung nodules in a long time smoker.  P: Continue chest tube to 20 cm suction  Follow intermittent CXR  No water seal until leak closed > reviewed that this can be a slow process with the patient.   Canceled 2/11 appt with Dr. Valeta Harms as inpatient  Continue Anoro, tussionex  Nicotine patch   RUL Non-small cell carcinoma, features slightly favor adenocarcinoma P: Will need ONC evaluation  ? If he will need further tissue sampling for genetic stains  Hypertension and history of coronary artery disease P: Continue norvasc, lopressor  Hold plavix for now  Nausea and vomiting, history of severe GERD P: Continue home PPI  Reglan PRN   Leukocytosis; no fever   P: Follow CBC  Hyponatremia P: Trend BMP    Best practice:  Diet: Regular Pain/Anxiety/Delirium protocol (if indicated): na VAP protocol (if indicated): na DVT prophylaxis: heparin, scds GI prophylaxis: Home PPI Glucose control: na Mobility: up in chair Code Status: full Family Communication: Updated on biopsy findings.   Disposition: Remain  in hospital for ongoing pneumothorax   Noe Gens, NP-C [place] Pgr: [phone] or if no answer [phone] 09/07/2018, 12:55 PM

## 2018-09-08 ENCOUNTER — Ambulatory Visit: Payer: BLUE CROSS/BLUE SHIELD | Admitting: Pulmonary Disease

## 2018-09-08 ENCOUNTER — Inpatient Hospital Stay (HOSPITAL_COMMUNITY): Payer: BLUE CROSS/BLUE SHIELD

## 2018-09-08 DIAGNOSIS — C3490 Malignant neoplasm of unspecified part of unspecified bronchus or lung: Secondary | ICD-10-CM

## 2018-09-08 DIAGNOSIS — J9382 Other air leak: Secondary | ICD-10-CM

## 2018-09-08 MED ORDER — DOCUSATE SODIUM 100 MG PO CAPS
100.0000 mg | ORAL_CAPSULE | Freq: Two times a day (BID) | ORAL | Status: DC
  Administered 2018-09-08 – 2018-09-23 (×29): 100 mg via ORAL
  Filled 2018-09-08 (×32): qty 1

## 2018-09-08 MED ORDER — POLYETHYLENE GLYCOL 3350 17 G PO PACK
17.0000 g | PACK | Freq: Once | ORAL | Status: DC
  Filled 2018-09-08 (×2): qty 1

## 2018-09-08 NOTE — Progress Notes (Signed)
Flushed chest tube with 30 cc sterile saline per order. Order is q8. Per night shift RN, she flushed it at 0600.  Dewaine Oats, RN

## 2018-09-08 NOTE — Progress Notes (Signed)
   NAME:  Sean Rose, MRN:  [id number], DOB:  [birth year], LOS: 45 ADMISSION DATE:  09/02/2018, CONSULTATION DATE:  2/5 REFERRING MD:  Dr. Valeta Harms, CHIEF COMPLAINT:  Post Op PTX    Brief History   63 year old with pulmonary nodules who suffered iatrogenic pneumothorax s/p navigational bronch 2/5.   Significant Hospital Events   2/5 admitted for post-procedural pneumothorax 2/6 Still with significant 1-2 out of 7 airleak.   Consults:    Procedures:  Chest tube 2/5 >>   Significant Diagnostic Tests:  Navigational Bronchoscopy 2/5 >> Transbronchial needle aspiration specimen D, navigation RUL nodule #2 brushing >> malignant cells present consistent with non-small cell carcinoma, features slightly favor adenocarcinoma.  There is insufficient material present for additional studies   Micro Data:  2/5 Sputum Culture >>neg 2/5 Sputum  AFB >> neg smear >>  2/5 Sputum fungus >>  Antimicrobials:     Interim history/subjective:  Pt voices frustration over series of events.  Wants to know "the plan" for lung nodule, oncology plan.   Objective   Blood pressure 128/71, pulse 64, temperature 98.1 F (36.7 C), temperature source Oral, resp. rate 16, height 5\' 8"  (1.727 m), weight 58.6 kg, SpO2 96 %.        Intake/Output Summary (Last 24 hours) at 09/08/2018 1030 Last data filed at 09/08/2018 0555 Gross per 24 hour  Intake 420 ml  Output 1060 ml  Net -640 ml   Filed Weights   09/02/18 0644 09/02/18 1519  Weight: 59 kg 58.6 kg    Examination: General: adult male sitting up in bed, NAD HEENT: MM pink/moist, no jvd Neuro: AAOx4, speech clear CV: s1s2 rrr, no m/r/g PULM: resps even non labored on RA with good bilat air movement.  R chest tube to suction without air leak. Residual R basilar PTX on CXR.  PR:FFMB, non-tender, bsx4 active  Extremities: warm/dry, no edema  Skin: no rashes or lesions  Resolved Hospital Problem list     Assessment & Plan:   Right sided pneumothorax  s/p navigational bronchoscopy for multiple PET avid RUL lung nodules in a long time smoker.  Ongoing residual R basilar ptx on CXR 2/11 personally visualized although air leak in [place] seems improved P: Continue chest tube to 20 cm suction  Follow intermittent CXR  Discussed at length with patient  Continue Anoro, tussionex  Nicotine patch   RUL Non-small cell carcinoma, features slightly favor adenocarcinoma P: Will need ONC evaluation  ? If he will need further tissue sampling for genetic stains To be discussed in multidisciplinary thoracic oncology conference per Dr Elsworth Soho   Hypertension and history of coronary artery disease P: Continue norvasc, lopressor  Hold plavix for now  Nausea and vomiting, history of severe GERD P: Continue home PPI  Reglan PRN   Leukocytosis; no fever   P: Follow CBC  Hyponatremia P: Trend BMP    Best practice:  Diet: Regular Pain/Anxiety/Delirium protocol (if indicated): na VAP protocol (if indicated): na DVT prophylaxis: heparin, scds GI prophylaxis: Home PPI Glucose control: na Mobility: up in chair Code Status: full Family Communication: no family at bedside.  Pt updated at length 2/11 Disposition: Remain  in hospital for ongoing pneumothorax  Nickolas Madrid, NP 09/08/2018  10:30 AM Pager: [phone] or [phone]

## 2018-09-09 ENCOUNTER — Inpatient Hospital Stay (HOSPITAL_COMMUNITY): Payer: BLUE CROSS/BLUE SHIELD

## 2018-09-09 DIAGNOSIS — R079 Chest pain, unspecified: Secondary | ICD-10-CM

## 2018-09-09 NOTE — Progress Notes (Signed)
   NAME:  Sean Rose, MRN:  [id number], DOB:  1956/02/19, LOS: 56 ADMISSION DATE:  09/02/2018, CONSULTATION DATE:  2/5 REFERRING MD:  Dr. Valeta Harms, CHIEF COMPLAINT:  Post Op PTX    Brief History   63 year old male with multiple pulmonary nodules admitted for iatrogenic pneumothorax s/p navigational bronchoscopy on 2/5. Chest tube dislodged on 2/8 requiring replacement. Chest tube with persistent air leak.  Significant Hospital Events   2/5 admitted for post-procedural pneumothorax 2/6 Still with significant 1-2 out of 7 airleak.  2/8 Chest tube dislodgement with hemodynamic instability requiring CT replacement 2/9-2/12 Persistent air leak Consults:  CTS  Procedures:  Right Chest tube 2/5 >2/8 Right Chest tube 2/8>   Significant Diagnostic Tests:  Navigational Bronchoscopy 2/5 >> Transbronchial needle aspiration specimen D, navigation RUL nodule #2 brushing >> malignant cells present consistent with non-small cell carcinoma, features slightly favor adenocarcinoma.  There is insufficient material present for additional studies   Micro Data:  2/5 Sputum Culture >>neg 2/5 Sputum  AFB >> neg smear >>  2/5 Sputum fungus >>no fungus observed  Antimicrobials:     Interim history/subjective:  No events overnight. Reports mild chest discomfort over right upper chest. Denies dyspnea. Reports good BM. Ambulates in room.  Objective   Blood pressure 131/79, pulse 60, temperature 98.1 F (36.7 C), temperature source Oral, resp. rate 20, height 5\' 8"  (1.727 m), weight 58.6 kg, SpO2 99 %.        Intake/Output Summary (Last 24 hours) at 09/09/2018 1601 Last data filed at 09/09/2018 0701 Gross per 24 hour  Intake -  Output 575 ml  Net -575 ml   Filed Weights   09/02/18 0644 09/02/18 1519  Weight: 59 kg 58.6 kg   Physical Exam: General: Thin, well-appearing, no acute distress HENT: [place], AT, OP clear, MMM Eyes: EOMI, no scleral icterus Respiratory: Decreased right basilar breath sounds.  Chest tube in place on suction with air leak Cardiovascular: RRR, -M/R/G, no JVD GI: BS+, soft, nontender Extremities:-Edema,-tenderness Neuro: AAO x4, CNII-XII grossly intact Skin: Dressing in place on right chest. Right sided subcutaneous air Psych: Normal mood, normal affect GU: Foley in place  Resolved Hospital Problem list   Leukocytosis  Assessment & Plan:   Iatrogenic right sided pneumothorax s/p navigational bronchoscopy  I personally reviewed CXR 09/09/18 - Trace apical PTX. Difficult to visualize basilar PTX. P: CTS consulted: I spoke with [name] via phone. Appreciate recs. Patient may benefit from RUL lobectomy with tentative surgery plan next week Continue chest tube to 20 cm suction  CXR now due to pain PRN pain meds   RUL Non-small cell carcinoma, features slightly favor adenocarcinoma P: Will discuss at multidisciplinary conference on 09/10/18   COPD/Emphysema PFTs 07/15/18 reviewed and demonstrate mild obstructive defect (FEV1 90%) with significant BD response, air trapping and mildly reduced DLCO. Continue home Anoro Ellipta  Hypertension and history of coronary artery disease P: Continue norvasc, lopressor  Hold plavix for now  Nausea and vomiting, history of severe GERD Cough P: Continue home PPI  Anti-tussives Reglan PRN   Hyponatremia, Hypokalemia P: Trend BMP   Best practice:  Diet: Regular Pain/Anxiety/Delirium protocol (if indicated): na VAP protocol (if indicated): na DVT prophylaxis: heparin, scds GI prophylaxis: Home PPI Glucose control: na Mobility: up in chair Code Status: full Family Communication: Updated patient and wife at bedside Disposition: Remain on floor  Rodman Pickle, M.D. [facility] Pulmonary/Critical Care Medicine Pager: [phone] After hours pager: [phone]

## 2018-09-10 ENCOUNTER — Inpatient Hospital Stay (HOSPITAL_COMMUNITY): Payer: BLUE CROSS/BLUE SHIELD

## 2018-09-10 ENCOUNTER — Other Ambulatory Visit: Payer: Self-pay | Admitting: Cardiovascular Disease

## 2018-09-10 ENCOUNTER — Other Ambulatory Visit: Payer: Self-pay | Admitting: *Deleted

## 2018-09-10 DIAGNOSIS — J9311 Primary spontaneous pneumothorax: Secondary | ICD-10-CM

## 2018-09-10 DIAGNOSIS — C3491 Malignant neoplasm of unspecified part of right bronchus or lung: Secondary | ICD-10-CM

## 2018-09-10 LAB — BASIC METABOLIC PANEL
Anion gap: 9 (ref 5–15)
BUN: <5 mg/dL — ABNORMAL LOW (ref 8–23)
CO2: 25 mmol/L (ref 22–32)
Calcium: 8.5 mg/dL — ABNORMAL LOW (ref 8.9–10.3)
Chloride: 96 mmol/L — ABNORMAL LOW (ref 98–111)
Creatinine, Ser: 0.56 mg/dL — ABNORMAL LOW (ref 0.61–1.24)
GFR calc Af Amer: >60 mL/min (ref >60)
Glucose, Bld: 114 mg/dL — ABNORMAL HIGH (ref 70–99)
POTASSIUM: 2.8 mmol/L — AB (ref 3.5–5.1)
Sodium: 130 mmol/L — ABNORMAL LOW (ref 135–145)

## 2018-09-10 LAB — CBC
HCT: 36.2 % — ABNORMAL LOW (ref 39.0–52.0)
Hemoglobin: 13.2 g/dL (ref 13.0–17.0)
MCH: 35.8 pg — ABNORMAL HIGH (ref 26.0–34.0)
MCHC: 36.5 g/dL — ABNORMAL HIGH (ref 30.0–36.0)
MCV: 98.1 fL (ref 80.0–100.0)
PLATELETS: 327 10*3/uL (ref 150–400)
RBC: 3.69 MIL/uL — ABNORMAL LOW (ref 4.22–5.81)
RDW: 12.3 % (ref 11.5–15.5)
WBC: 6.3 10*3/uL (ref 4.0–10.5)
nRBC: 0 % (ref 0.0–0.2)

## 2018-09-10 MED ORDER — ESOMEPRAZOLE MAGNESIUM 20 MG PO CPDR
40.0000 mg | DELAYED_RELEASE_CAPSULE | Freq: Two times a day (BID) | ORAL | Status: DC
  Administered 2018-09-10 – 2018-09-24 (×27): 40 mg via ORAL
  Filled 2018-09-10 (×29): qty 2

## 2018-09-10 MED ORDER — POTASSIUM CHLORIDE CRYS ER 20 MEQ PO TBCR
40.0000 meq | EXTENDED_RELEASE_TABLET | Freq: Four times a day (QID) | ORAL | Status: AC
  Administered 2018-09-10 – 2018-09-11 (×4): 40 meq via ORAL
  Filled 2018-09-10 (×4): qty 2

## 2018-09-10 NOTE — H&P (View-Only) (Signed)
8 Days Post-Op Procedure(s) (LRB): VIDEO BRONCHOSCOPY WITH ENDOBRONCHIAL NAVIGATION (N/A) Chest Tube Insertion (Right) Subjective: Feels better  Objective: Vital signs in last 24 hours: Temp:  [98.2 F (36.8 C)-98.5 F (36.9 C)] 98.2 F (36.8 C) (02/13 0735) Pulse Rate:  [59-61] 61 (02/13 0805) Cardiac Rhythm: Normal sinus rhythm;Sinus bradycardia (02/13 0800) Resp:  [12] 12 (02/13 0805) BP: (114-136)/(68-78) 133/68 (02/13 0735) SpO2:  [96 %-98 %] 96 % (02/13 0805)  Hemodynamic parameters for last 24 hours:    Intake/Output from previous day: 02/12 0701 - 02/13 0700 In: 780 [P.O.:720] Out: 1610 [Urine:1500; Chest Tube:110] Intake/Output this shift: No intake/output data recorded.  General appearance: alert, cooperative and no distress minimal air leak  Lab Results: Recent Labs    09/10/18 0315  WBC 6.3  HGB 13.2  HCT 36.2*  PLT 327   BMET:  Recent Labs    09/10/18 0315  NA 130*  K 2.8*  CL 96*  CO2 25  GLUCOSE 114*  BUN <5*  CREATININE 0.56*  CALCIUM 8.5*    PT/INR: No results for input(s): LABPROT, INR in the last 72 hours. ABG No results found for: PHART, HCO3, TCO2, ACIDBASEDEF, O2SAT CBG (last 3)  No results for input(s): GLUCAP in the last 72 hours.  Assessment/Plan: S/P Procedure(s) (LRB): VIDEO BRONCHOSCOPY WITH ENDOBRONCHIAL NAVIGATION (N/A) Chest Tube Insertion (Right) -Air leak post biopsy- air leak is down considerably. I think it would be reasonable to try on water seal- will defer to Pulmonary who have been managing  Multiple right upper lobe lung nodules. 1 biopsy + for non-small cell carcinoma. I suspect these nodules are all the same process, but there is a small possibility the others are infectious or inflammatory. I think we have to proceed as if he has multifocal adenocarcinomas. Given that the lesions are concentrated in the right upper lobe with only very small left lung nodules, I think the best option for treatment is a right  upper lobectomy. That was the consensus of our multidisciplinary oncology group.  I discussed the general nature of the procedure, the need for general anesthesia, the incisions to be used and the use of a drainage tube postoperatively with Mr and Mrs Goens. We discussed the expected hospital stay, overall recovery and short and long term outcomes. I informed them of the indications, risks, benefits and alternatives. They understand the risks include, but are not limited to death, stroke, MI, DVT/PE, bleeding, possible need for transfusion, infections, prolonged air leak, cardiac arrhythmias, as well as other organ system dysfunction including respiratory, renal, or GI complications.   He accepts the risks and agrees to proceed.  If his air leak does not resolve, I have a spot reserved for him on the OR schedule Monday. If air leak resolves he would like to go home for a few days prior to surgery.   LOS: 8 days    Melrose Nakayama 09/10/2018

## 2018-09-10 NOTE — Progress Notes (Signed)
8 Days Post-Op Procedure(s) (LRB): VIDEO BRONCHOSCOPY WITH ENDOBRONCHIAL NAVIGATION (N/A) Chest Tube Insertion (Right) Subjective: Feels better  Objective: Vital signs in last 24 hours: Temp:  [98.2 F (36.8 C)-98.5 F (36.9 C)] 98.2 F (36.8 C) (02/13 0735) Pulse Rate:  [59-61] 61 (02/13 0805) Cardiac Rhythm: Normal sinus rhythm;Sinus bradycardia (02/13 0800) Resp:  [12] 12 (02/13 0805) BP: (114-136)/(68-78) 133/68 (02/13 0735) SpO2:  [96 %-98 %] 96 % (02/13 0805)  Hemodynamic parameters for last 24 hours:    Intake/Output from previous day: 02/12 0701 - 02/13 0700 In: 780 [P.O.:720] Out: 1610 [Urine:1500; Chest Tube:110] Intake/Output this shift: No intake/output data recorded.  General appearance: alert, cooperative and no distress minimal air leak  Lab Results: Recent Labs    09/10/18 0315  WBC 6.3  HGB 13.2  HCT 36.2*  PLT 327   BMET:  Recent Labs    09/10/18 0315  NA 130*  K 2.8*  CL 96*  CO2 25  GLUCOSE 114*  BUN <5*  CREATININE 0.56*  CALCIUM 8.5*    PT/INR: No results for input(s): LABPROT, INR in the last 72 hours. ABG No results found for: PHART, HCO3, TCO2, ACIDBASEDEF, O2SAT CBG (last 3)  No results for input(s): GLUCAP in the last 72 hours.  Assessment/Plan: S/P Procedure(s) (LRB): VIDEO BRONCHOSCOPY WITH ENDOBRONCHIAL NAVIGATION (N/A) Chest Tube Insertion (Right) -Air leak post biopsy- air leak is down considerably. I think it would be reasonable to try on water seal- will defer to Pulmonary who have been managing  Multiple right upper lobe lung nodules. 1 biopsy + for non-small cell carcinoma. I suspect these nodules are all the same process, but there is a small possibility the others are infectious or inflammatory. I think we have to proceed as if he has multifocal adenocarcinomas. Given that the lesions are concentrated in the right upper lobe with only very small left lung nodules, I think the best option for treatment is a right  upper lobectomy. That was the consensus of our multidisciplinary oncology group.  I discussed the general nature of the procedure, the need for general anesthesia, the incisions to be used and the use of a drainage tube postoperatively with Mr and Mrs Traynham. We discussed the expected hospital stay, overall recovery and short and long term outcomes. I informed them of the indications, risks, benefits and alternatives. They understand the risks include, but are not limited to death, stroke, MI, DVT/PE, bleeding, possible need for transfusion, infections, prolonged air leak, cardiac arrhythmias, as well as other organ system dysfunction including respiratory, renal, or GI complications.   He accepts the risks and agrees to proceed.  If his air leak does not resolve, I have a spot reserved for him on the OR schedule Monday. If air leak resolves he would like to go home for a few days prior to surgery.   LOS: 8 days    Melrose Nakayama 09/10/2018

## 2018-09-10 NOTE — Progress Notes (Addendum)
Results for KHALIL, SZCZEPANIK "WES" (MRN [id number]) as of 09/10/2018 11:01  Ref. Range 09/10/2018 03:15  Potassium Latest Ref Range: 3.5 - 5.1 mmol/L 2.8 (L)   Unable to find pager number for [facility] DO, attempted paging pccm pager twice with no response for above lab result. Pt remains on tele in NSR and stable at this time. No orders for potassium replacement.   Update: received callback from [place] states p[facility]m is only consulted and not primary. Still no pager listed on amion for attending. Will keep searching.

## 2018-09-10 NOTE — Progress Notes (Signed)
The proposed treatment discussed in cancer conference 09/10/2018 is for discussion purpose only and is not a binding recommendation.  The patient was not physically examined nor present for their treatment options. Therefore, final treatment plans cannot be decided.

## 2018-09-10 NOTE — Progress Notes (Addendum)
NAME:  Sean Rose, MRN:  [id number], DOB:  January 09, 1956, LOS: 5 ADMISSION DATE:  09/02/2018, CONSULTATION DATE:  2/5 REFERRING MD:  Dr. Valeta Harms, CHIEF COMPLAINT:  Post Op PTX    Brief History   63 year old male with multiple pulmonary nodules admitted for iatrogenic pneumothorax s/p navigational bronchoscopy on 2/5. Chest tube dislodged on 2/8 requiring replacement. Chest tube with persistent air leak.  Significant Hospital Events   2/5 admitted for post-procedural pneumothorax 2/6 Still with significant 1-2 out of 7 airleak.  2/8 Chest tube dislodgement with hemodynamic instability requiring CT replacement 2/9-2/12 Persistent air leak Consults:  CTS  Procedures:  Right Chest tube 2/5 >2/8 Right Chest tube 2/8>   Significant Diagnostic Tests:  Navigational Bronchoscopy 2/5 >> Transbronchial needle aspiration specimen D, navigation RUL nodule #2 brushing >> malignant cells present consistent with non-small cell carcinoma, features slightly favor adenocarcinoma.  There is insufficient material present for additional studies   Micro Data:  2/5 Sputum Culture >>neg 2/5 Sputum  AFB >> neg smear >>  2/5 Sputum fungus >>no fungus observed  Antimicrobials:     Interim history/subjective:  No events overnight. CXR yesterday stable. Denies SOB, chest pain  Objective   Blood pressure 133/68, pulse 61, temperature 98.2 F (36.8 C), temperature source Oral, resp. rate 12, height 5\' 8"  (1.727 m), weight 58.6 kg, SpO2 96 %.        Intake/Output Summary (Last 24 hours) at 09/10/2018 1850 Last data filed at 09/10/2018 1738 Gross per 24 hour  Intake 1380 ml  Output 2030 ml  Net -650 ml   Filed Weights   09/02/18 0644 09/02/18 1519  Weight: 59 kg 58.6 kg   Physical Exam: General: Thin, well-appearing, no acute distress HENT: [place], AT, OP clear, MMM Eyes: EOMI, no scleral icterus Respiratory: Air movement bilaterally. Chest tube in place with improved air leak grade 1-2 every 15  minutes Cardiovascular: RRR, -M/R/G, no JVD GI: BS+, soft, nontender Extremities:-Edema,-tenderness Neuro: AAO x4, CNII-XII grossly intact Skin: Crepitus presents on right chest Psych: Normal mood, normal affect   Resolved Hospital Problem list   Leukocytosis  Assessment & Plan:   Iatrogenic right sided pneumothorax s/p navigational bronchoscopy  I personally reviewed CXR 09/09/18 - Trace apical PTX. Difficult to visualize basilar PTX. P: CTS consulted. Appreciate recs. If air leak remains over the weekend, he will be scheduled for OR on Monday Continue chest tube to 20 cm suction CXR tomorrow PRN pain meds  RUL Non-small cell carcinoma, features slightly favor adenocarcinoma P: I participated in [facility] meeting this morning. [facility] recommends right upper lobectomy with plan for serial monitoring of remaining lung lesions. This plan was communicated to patient and wife.  COPD/Emphysema PFTs 07/15/18 reviewed and demonstrate mild obstructive defect (FEV1 90%) with significant BD response, air trapping and mildly reduced DLCO. Continue home Anoro Ellipta  Hypertension and history of coronary artery disease P: Continue norvasc, lopressor  Hold plavix for now  Nausea and vomiting, history of severe GERD Cough P: Continue home PPI  Anti-tussives Reglan PRN   Hyponatremia, Hypokalemia P: Trend BMP -Repleted potassium   Best practice:  Diet: Regular Pain/Anxiety/Delirium protocol (if indicated): na VAP protocol (if indicated): na DVT prophylaxis: heparin, scds GI prophylaxis: Home PPI Glucose control: na Mobility: up in chair Code Status: full Family Communication: Updated patient and faily Disposition: Remain on floor   The patient is critically ill with multiple organ systems failure and requires high complexity decision making for assessment and support, frequent  evaluation and titration of therapies, application of advanced monitoring technologies and extensive  interpretation of multiple databases.   Critical Care Time devoted to patient care services described in this note is 50 Minutes. This time reflects time of care of this signee Dr. Rodman Pickle. This critical care time does not reflect procedure time, or teaching time or supervisory time of PA/NP/Med student/Med Resident etc but could involve care discussion time.   Rodman Pickle, M.D. [facility] Pulmonary/Critical Care Medicine Pager: [phone] After hours pager: [phone]

## 2018-09-11 ENCOUNTER — Inpatient Hospital Stay (HOSPITAL_COMMUNITY): Payer: BLUE CROSS/BLUE SHIELD

## 2018-09-11 LAB — CBC
HEMATOCRIT: 39.2 % (ref 39.0–52.0)
Hemoglobin: 14.1 g/dL (ref 13.0–17.0)
MCH: 35.9 pg — ABNORMAL HIGH (ref 26.0–34.0)
MCHC: 36 g/dL (ref 30.0–36.0)
MCV: 99.7 fL (ref 80.0–100.0)
Platelets: 383 10*3/uL (ref 150–400)
RBC: 3.93 MIL/uL — ABNORMAL LOW (ref 4.22–5.81)
RDW: 12.5 % (ref 11.5–15.5)
WBC: 6.7 10*3/uL (ref 4.0–10.5)
nRBC: 0 % (ref 0.0–0.2)

## 2018-09-11 LAB — BASIC METABOLIC PANEL
Anion gap: 7 (ref 5–15)
Anion gap: 8 (ref 5–15)
Anion gap: 4 — ABNORMAL LOW (ref 5–15)
BUN: <5 mg/dL — ABNORMAL LOW (ref 8–23)
BUN: <5 mg/dL — ABNORMAL LOW (ref 8–23)
BUN: <5 mg/dL — ABNORMAL LOW (ref 8–23)
CHLORIDE: 97 mmol/L — AB (ref 98–111)
CO2: 26 mmol/L (ref 22–32)
CO2: 25 mmol/L (ref 22–32)
CO2: 25 mmol/L (ref 22–32)
Calcium: 8.9 mg/dL (ref 8.9–10.3)
Calcium: 9 mg/dL (ref 8.9–10.3)
Calcium: 8.5 mg/dL — ABNORMAL LOW (ref 8.9–10.3)
Chloride: 98 mmol/L (ref 98–111)
Chloride: 100 mmol/L (ref 98–111)
Creatinine, Ser: 0.7 mg/dL (ref 0.61–1.24)
Creatinine, Ser: 0.67 mg/dL (ref 0.61–1.24)
Creatinine, Ser: 0.66 mg/dL (ref 0.61–1.24)
GFR calc Af Amer: >60 mL/min (ref >60)
GFR calc Af Amer: >60 mL/min (ref >60)
GFR calc non Af Amer: >60 mL/min (ref >60)
GFR calc non Af Amer: >60 mL/min (ref >60)
Glucose, Bld: 106 mg/dL — ABNORMAL HIGH (ref 70–99)
Glucose, Bld: 119 mg/dL — ABNORMAL HIGH (ref 70–99)
Glucose, Bld: 127 mg/dL — ABNORMAL HIGH (ref 70–99)
Potassium: 3.9 mmol/L (ref 3.5–5.1)
Potassium: 4.3 mmol/L (ref 3.5–5.1)
Potassium: 4.5 mmol/L (ref 3.5–5.1)
Sodium: 131 mmol/L — ABNORMAL LOW (ref 135–145)
Sodium: 130 mmol/L — ABNORMAL LOW (ref 135–145)
Sodium: 129 mmol/L — ABNORMAL LOW (ref 135–145)

## 2018-09-11 LAB — MAGNESIUM: Magnesium: 1.7 mg/dL (ref 1.7–2.4)

## 2018-09-11 NOTE — Progress Notes (Signed)
   NAME:  Sean Rose, MRN:  [id number], DOB:  [birth year], LOS: 42 ADMISSION DATE:  09/02/2018, CONSULTATION DATE:  2/5 REFERRING MD:  Dr. Valeta Harms, CHIEF COMPLAINT:  Post Op PTX    Brief History   63 year old male with multiple pulmonary nodules admitted for iatrogenic pneumothorax s/p navigational bronchoscopy on 2/5. Chest tube dislodged on 2/8 requiring replacement. Chest tube with persistent air leak.  Significant Hospital Events   2/5 admitted for post-procedural pneumothorax 2/6 Still with significant 1-2 out of 7 airleak.  2/8 Chest tube dislodgement with hemodynamic instability requiring CT replacement 2/9-2/12 Persistent air leak Consults:  CTS  Procedures:  Right Chest tube 2/5 >2/8 Right Chest tube 2/8>   Significant Diagnostic Tests:  Navigational Bronchoscopy 2/5 >> Transbronchial needle aspiration specimen D, navigation RUL nodule #2 brushing >> malignant cells present consistent with non-small cell carcinoma, features slightly favor adenocarcinoma.  There is insufficient material present for additional studies   Micro Data:  2/5 Sputum Culture >>neg 2/5 Sputum  AFB >> neg smear >>  2/5 Sputum fungus >>no fungus observed  Antimicrobials:     Interim history/subjective:  No events overnight. Reports mild right chest pain at chest tube insertion site. He expresses desire to go home if feasible. We discussed that if his air leak remains, he will need surgical intervention first.  Objective   Blood pressure 130/73, pulse (!) 57, temperature 97.9 F (36.6 C), resp. rate 18, height 5\' 8"  (1.727 m), weight 58.6 kg, SpO2 100 %.        Intake/Output Summary (Last 24 hours) at 09/11/2018 1810 Last data filed at 09/11/2018 0800 Gross per 24 hour  Intake 300 ml  Output 370 ml  Net -70 ml   Filed Weights   09/02/18 0644 09/02/18 1519  Weight: 59 kg 58.6 kg    Physical Exam: General: Thin, well-appearing, no acute distress HENT: [place], AT, OP clear, MMM Eyes: EOMI, no  scleral icterus Respiratory: Clear to auscultation bilaterally.  No crackles, wheezing or rales. Right chest tube with grade 1 air leak when coughing, improved Cardiovascular: RRR, -M/R/G, no JVD GI: BS+, soft, nontender Extremities:-Edema,-tenderness Neuro: AAO x4, CNII-XII grossly intact Skin: Intact, no rashes or bruising Psych: Normal mood, normal affect  Resolved Hospital Problem list   Leukocytosis Hypokalemia  Assessment & Plan:   Iatrogenic right sided pneumothorax s/p navigational bronchoscopy  I personally reviewed CXR 2/14: Improved trace apical PTX, chest tube in place. Air leak improved but persistent. P: Continue chest tube to 20 cm suction. No plan to [facility] today CXR tomorrow PRN pain meds  RUL Non-small cell carcinoma, features slightly favor adenocarcinoma P: CTS following. Plan for lung resection 2/17  COPD/Emphysema PFTs 07/15/18 reviewed and demonstrate mild obstructive defect (FEV1 90%) with significant BD response, air trapping and mildly reduced DLCO. Continue home Anoro Ellipta  Hypertension and history of coronary artery disease P: Continue norvasc, lopressor  Hold plavix for now  Anxiety P: Xanax TID PRN  Nausea and vomiting, history of severe GERD Cough P: Continue home PPI  Anti-tussives Reglan PRN   Hyponatremia P: Trend BMP  Constipation P: Bowel regimen   Best practice:  Diet: Regular Pain/Anxiety/Delirium protocol (if indicated): na VAP protocol (if indicated): na DVT prophylaxis: heparin, scds GI prophylaxis: Home PPI Glucose control: na Mobility: up in chair Code Status: full Family Communication: Patient only Disposition: Remain on floor  Rodman Pickle, M.D. [facility] Pulmonary/Critical Care Medicine Pager: [phone] After hours pager: [phone]

## 2018-09-11 NOTE — Progress Notes (Signed)
9 Days Post-Op Procedure(s) (LRB): VIDEO BRONCHOSCOPY WITH ENDOBRONCHIAL NAVIGATION (N/A) Chest Tube Insertion (Right) Subjective: No complaints today  Objective: Vital signs in last 24 hours: Temp:  [97.9 F (36.6 C)-98.1 F (36.7 C)] 97.9 F (36.6 C) (02/14 0733) Pulse Rate:  [59-64] 64 (02/14 0900) Cardiac Rhythm: Normal sinus rhythm (02/14 0700) Resp:  [12-18] 18 (02/14 0900) BP: (106-126)/(69-84) 106/69 (02/14 0733) SpO2:  [96 %-99 %] 99 % (02/14 0900)  Hemodynamic parameters for last 24 hours:    Intake/Output from previous day: 02/13 0701 - 02/14 0700 In: 690 [P.O.:600] Out: 790 [Urine:750; Chest Tube:40] Intake/Output this shift: Total I/O In: 240 [P.O.:240] Out: -   General appearance: alert, cooperative and no distress + air leak while talking  Lab Results: Recent Labs    09/10/18 0315 09/11/18 0311  WBC 6.3 6.7  HGB 13.2 14.1  HCT 36.2* 39.2  PLT 327 383   BMET:  Recent Labs    09/11/18 0311 09/11/18 0710  NA 131* 130*  K 3.9 4.3  CL 98 97*  CO2 26 25  GLUCOSE 106* 119*  BUN <5* <5*  CREATININE 0.70 0.67  CALCIUM 8.9 9.0    PT/INR: No results for input(s): LABPROT, INR in the last 72 hours. ABG No results found for: PHART, HCO3, TCO2, ACIDBASEDEF, O2SAT CBG (last 3)  No results for input(s): GLUCAP in the last 72 hours.  Assessment/Plan: S/P Procedure(s) (LRB): VIDEO BRONCHOSCOPY WITH ENDOBRONCHIAL NAVIGATION (N/A) Chest Tube Insertion (Right) -Persistent air leak. Stable with tube in place Multiple spiculated nodules in right upper lobe. 1 + for adenocarcinoma on biopsies. I suspect all are the same process  Still has an air leak- could try on water seal. Regardless at this point I think the best option is to plan for definitive surgery on Monday.  Sean Standard Roxan Hockey, MD [facility] [phone]    LOS: 9 days    Sean Rose 09/11/2018

## 2018-09-12 ENCOUNTER — Inpatient Hospital Stay (HOSPITAL_COMMUNITY): Payer: BLUE CROSS/BLUE SHIELD

## 2018-09-12 LAB — BLOOD GAS, ARTERIAL
Acid-base deficit: 0.1 mmol/L (ref 0.0–2.0)
Bicarbonate: 23.4 mmol/L (ref 20.0–28.0)
Drawn by: 548161
FIO2: 21
O2 Saturation: 94.5 %
Patient temperature: 98.6
pCO2 arterial: 33.7 mmHg (ref 32.0–48.0)
pH, Arterial: 7.455 — ABNORMAL HIGH (ref 7.350–7.450)
pO2, Arterial: 70.8 mmHg — ABNORMAL LOW (ref 83.0–108.0)

## 2018-09-12 LAB — BASIC METABOLIC PANEL
Anion gap: 12 (ref 5–15)
BUN: <5 mg/dL — ABNORMAL LOW (ref 8–23)
CALCIUM: 9 mg/dL (ref 8.9–10.3)
CO2: 23 mmol/L (ref 22–32)
CREATININE: 0.69 mg/dL (ref 0.61–1.24)
Chloride: 96 mmol/L — ABNORMAL LOW (ref 98–111)
GFR calc Af Amer: >60 mL/min (ref >60)
GFR calc non Af Amer: >60 mL/min (ref >60)
Glucose, Bld: 102 mg/dL — ABNORMAL HIGH (ref 70–99)
Potassium: 4.7 mmol/L (ref 3.5–5.1)
Sodium: 131 mmol/L — ABNORMAL LOW (ref 135–145)

## 2018-09-12 LAB — TYPE AND SCREEN
ABO/RH(D): A POS
Antibody Screen: NEGATIVE

## 2018-09-12 LAB — CBC
HCT: 39.6 % (ref 39.0–52.0)
HEMATOCRIT: 36.8 % — AB (ref 39.0–52.0)
HEMOGLOBIN: 13.2 g/dL (ref 13.0–17.0)
Hemoglobin: 14.1 g/dL (ref 13.0–17.0)
MCH: 35.9 pg — ABNORMAL HIGH (ref 26.0–34.0)
MCH: 35.7 pg — ABNORMAL HIGH (ref 26.0–34.0)
MCHC: 35.9 g/dL (ref 30.0–36.0)
MCHC: 35.6 g/dL (ref 30.0–36.0)
MCV: 100 fL (ref 80.0–100.0)
MCV: 100.3 fL — ABNORMAL HIGH (ref 80.0–100.0)
Platelets: 358 10*3/uL (ref 150–400)
Platelets: 400 10*3/uL (ref 150–400)
RBC: 3.68 MIL/uL — ABNORMAL LOW (ref 4.22–5.81)
RBC: 3.95 MIL/uL — ABNORMAL LOW (ref 4.22–5.81)
RDW: 12.4 % (ref 11.5–15.5)
RDW: 12.5 % (ref 11.5–15.5)
WBC: 6.8 10*3/uL (ref 4.0–10.5)
WBC: 7.8 10*3/uL (ref 4.0–10.5)
nRBC: 0 % (ref 0.0–0.2)
nRBC: 0 % (ref 0.0–0.2)

## 2018-09-12 LAB — PROTIME-INR
INR: 1.07
Prothrombin Time: 13.8 seconds (ref 11.4–15.2)

## 2018-09-12 LAB — ABO/RH: ABO/RH(D): A POS

## 2018-09-12 LAB — APTT: aPTT: 35 seconds (ref 24–36)

## 2018-09-12 MED ORDER — VANCOMYCIN HCL IN DEXTROSE 1-5 GM/200ML-% IV SOLN
1000.0000 mg | INTRAVENOUS | Status: AC
  Administered 2018-09-14 (×2): 1000 mg via INTRAVENOUS
  Filled 2018-09-12 (×2): qty 200

## 2018-09-12 MED ORDER — VANCOMYCIN HCL IN DEXTROSE 1-5 GM/200ML-% IV SOLN: 1000.0000 mg | INTRAVENOUS | Status: DC

## 2018-09-12 NOTE — Progress Notes (Signed)
NAME:  Sean Rose, MRN:  [id number], DOB:  Nov 27, 1955, LOS: 23 ADMISSION DATE:  09/02/2018, CONSULTATION DATE:  2/5 REFERRING MD:  Dr. Valeta Harms, CHIEF COMPLAINT:  Post Op PTX    Brief History   63 year old male with multiple pulmonary nodules admitted for iatrogenic pneumothorax s/p navigational bronchoscopy on 2/5. Chest tube dislodged on 2/8 requiring replacement. Chest tube with persistent air leak.  Significant Hospital Events   2/5 admitted for post-procedural pneumothorax 2/6 Still with significant 1-2 out of 7 airleak.  2/8 Chest tube dislodgement with hemodynamic instability requiring CT replacement 2/9-2/12 Persistent air leak 2/14 - No events overnight. Reports mild right chest pain at chest tube insertion site. He expresses desire to go home if feasible. We discussed that if his air leak remains, he will need surgical intervention first. Consults:  CTS  Procedures:  Right Chest tube 2/5 >2/8 Right Chest tube 2/8>   Significant Diagnostic Tests:  Navigational Bronchoscopy 2/5 >> Transbronchial needle aspiration specimen D, navigation RUL nodule #2 brushing >> malignant cells present consistent with non-small cell carcinoma, features slightly favor adenocarcinoma.  There is insufficient material present for additional studies   Micro Data:  2/5 Sputum Culture >>neg 2/5 Sputum  AFB >> neg smear >>  2/5 Sputum fungus >>no fungus observed  Antimicrobials:     Interim history/subjective:    09/12/2018 - no new complaints. Lung resection fixed for Monday 09/14/2018 by Dr Roxan Hockey. He wants to know time. 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 He does NOT want to go with chest tube but wants it to heal enough to go home 09/13/18,. Suction turned off and no air leak noticed. So, we agreed to clamp with serial CXR and take a decision in 09/13/18 but wife not keen with this plan. She  prefers he have surgery in 2d but left to his wish/decision. After lot of back and forth and him realizing that even after ptx is healed there is 2-5% chance that he can have recurrence over few weeks he decided to get suction back on and just have surgery Monday 09/14/2018  Objective   Blood pressure 125/71, pulse (!) 58, temperature 98.1 F (36.7 C), temperature source Oral, resp. rate 16, height 5\' 8"  (1.727 m), weight 58.6 kg, SpO2 98 %.       No intake or output data in the 24 hours ending 09/12/18 1704 Filed Weights   09/02/18 0644 09/02/18 1519  Weight: 59 kg 58.6 kg   General Appearance:  Looks well Head:  Normocephalic, without obvious abnormality, atraumatic Eyes:  PERRL - yes, conjunctiva/corneas - clear     Ears:  Normal external ear canals, both ears Nose:  G tube - no Throat:  ETT TUBE - no , OG tube - no Neck:  Supple,  No enlargement/tenderness/nodules Lungs: Clear to auscultation bilaterally, Rt chest tube +.  Heart:  S1 and S2 normal, no murmur, CVP - no.  Pressors - no Abdomen:  Soft, no masses, no organomegaly Genitalia / Rectal:  Not done Extremities:  Extremities- intact Skin:  ntact in exposed areas . Neurologic:  Sedation - none -> RASS - +1 . Moves all 4s - yes. CAM-ICU - neg . Orientation - x3+     LABS    PULMONARY Recent Labs  Lab 09/12/18 0708  PHART 7.455*  PCO2ART 33.7  PO2ART 70.8*  HCO3  23.4  O2SAT 94.5    CBC Recent Labs  Lab 09/11/18 0311 09/12/18 0232 09/12/18 0713  HGB 14.1 13.2 14.1  HCT 39.2 36.8* 39.6  WBC 6.7 6.8 7.8  PLT 383 358 400    COAGULATION Recent Labs  Lab 09/12/18 0713  INR 1.07    CARDIAC  No results for input(s): TROPONINI in the last 168 hours. No results for input(s): PROBNP in the last 168 hours.   CHEMISTRY Recent Labs  Lab 09/10/18 0315 09/11/18 0311 09/11/18 0710 09/11/18 1706 09/12/18 0232  NA 130* 131* 130* 129* 131*  K 2.8* 3.9 4.3 4.5 4.7  CL 96* 98 97* 100 96*  CO2 25 26 25 25  23   GLUCOSE 114* 106* 119* 127* 102*  BUN <5* <5* <5* <5* <5*  CREATININE 0.56* 0.70 0.67 0.66 0.69  CALCIUM 8.5* 8.9 9.0 8.5* 9.0  MG  --   --  1.7  --   --    Estimated Creatinine Clearance: 79.4 mL/min (by C-G formula based on SCr of 0.69 mg/dL).   LIVER Recent Labs  Lab 09/12/18 0713  INR 1.07     INFECTIOUS No results for input(s): LATICACIDVEN, PROCALCITON in the last 168 hours.   ENDOCRINE CBG (last 3)  No results for input(s): GLUCAP in the last 72 hours.       IMAGING x48h  - image(s) personally visualized  -   highlighted in bold Dg Chest Port 1 View  Result Date: 09/12/2018 CLINICAL DATA:  Chest tube on right. Recent lung biopsy with air leak EXAM: PORTABLE CHEST 1 VIEW COMPARISON:  September 11, 2018 FINDINGS: Chest tube remains on the right peripherally, unchanged in position. There is persistent trace apical pneumothorax without tension component on the right. Moderate subcutaneous air remains on the right. There is no appreciable edema or consolidation. The heart size and pulmonary vascularity are normal. No adenopathy. There is aortic atherosclerosis. No bone lesions. IMPRESSION: Stable chest tube position on the right with trace apical pneumothorax on the right. Moderate subcutaneous air remains on the right. No edema or consolidation. Stable cardiac silhouette. Aortic Atherosclerosis (ICD10-I70.0). Electronically Signed   By: Lowella Grip III M.D.   On: 09/12/2018 09:08   Dg Chest Port 1 View  Result Date: 09/11/2018 CLINICAL DATA:  Pneumothorax EXAM: PORTABLE CHEST 1 VIEW COMPARISON:  Yesterday FINDINGS: Trace right apical pneumothorax. Stable chest wall emphysema. Stable chest tube positioning. Hyperinflation in the setting of COPD. Normal heart size and mediastinal contours. IMPRESSION: Improved and trace right apical pneumothorax. Electronically Signed   By: Monte Fantasia M.D.   On: 09/11/2018 10:41     Resolved Hospital Problem list     Leukocytosis Hypokalemia  Assessment & Plan:   Iatrogenic right sided pneumothorax s/p navigational bronchoscopy    2/15 - maybe air leak is gone   P: Continue chest tube with wall suction (after back and forth this is the plan) CXR tomorrow PRN pain meds  RUL Non-small cell carcinoma, features slightly favor adenocarcinoma P: CTS following. Plan for lung resection 2/17  COPD/Emphysema PFTs 07/15/18 reviewed and demonstrate mild obstructive defect (FEV1 90%) with significant BD response, air trapping and mildly reduced DLCO. Continue home Anoro Ellipta  Hypertension and history of coronary artery disease P: Continue norvasc, lopressor  Hold plavix for now  Anxiety P: Xanax TID PRN  Nausea and vomiting, history of severe GERD Cough P: Continue home PPI  Anti-tussives Reglan PRN   Hyponatremia P: Trend BMP  Constipation P: Bowel  regimen   Best practice:  Diet: Regular Pain/Anxiety/Delirium protocol (if indicated): na VAP protocol (if indicated): na DVT prophylaxis: heparin, scds GI prophylaxis: Home PPI Glucose control: na Mobility: up in chair Code Status: full Family Communication: Patient only Disposition: Remain on floor After hours pager: [phone]   > 50% of this > 40 min visit spent in face to face counseling or/and coordination of care - by this undersigned MD - Dr Brand Males. This includes one or more of the following documented above: discussion of test results, diagnostic or treatment recommendations, prognosis, risks and benefits of management options, instructions, education, compliance or risk-factor reduction    SIGNATURE    Dr. Brand Males, M.D., F.C.C.P,  Pulmonary and Critical Care Medicine Staff Physician, [place] Director - Interstitial Lung Disease  Program  P[facility] [place], [place], [address]  Pager: [phone], If no answer or  between  15:00h - 7:00h: call [phone] Telephone: [phone]  5:04 PM 09/12/2018

## 2018-09-13 ENCOUNTER — Inpatient Hospital Stay (HOSPITAL_COMMUNITY): Payer: BLUE CROSS/BLUE SHIELD

## 2018-09-13 LAB — CBC
HCT: 36.9 % — ABNORMAL LOW (ref 39.0–52.0)
Hemoglobin: 13.1 g/dL (ref 13.0–17.0)
MCH: 35.3 pg — ABNORMAL HIGH (ref 26.0–34.0)
MCHC: 35.5 g/dL (ref 30.0–36.0)
MCV: 99.5 fL (ref 80.0–100.0)
PLATELETS: 368 10*3/uL (ref 150–400)
RBC: 3.71 MIL/uL — ABNORMAL LOW (ref 4.22–5.81)
RDW: 12.4 % (ref 11.5–15.5)
WBC: 6.7 10*3/uL (ref 4.0–10.5)
nRBC: 0 % (ref 0.0–0.2)

## 2018-09-13 LAB — BASIC METABOLIC PANEL
Anion gap: 9 (ref 5–15)
BUN: <5 mg/dL — ABNORMAL LOW (ref 8–23)
CO2: 25 mmol/L (ref 22–32)
Calcium: 8.8 mg/dL — ABNORMAL LOW (ref 8.9–10.3)
Chloride: 94 mmol/L — ABNORMAL LOW (ref 98–111)
Creatinine, Ser: 0.65 mg/dL (ref 0.61–1.24)
GFR calc Af Amer: >60 mL/min (ref >60)
Glucose, Bld: 100 mg/dL — ABNORMAL HIGH (ref 70–99)
POTASSIUM: 4.3 mmol/L (ref 3.5–5.1)
Sodium: 128 mmol/L — ABNORMAL LOW (ref 135–145)

## 2018-09-13 NOTE — Progress Notes (Addendum)
.   [name], MRN:  [id number], DOB:  10-Apr-1956, LOS: 76 ADMISSION DATE:  09/02/2018, CONSULTATION DATE:  2/5 REFERRING MD:  Dr. Valeta Rose, CHIEF COMPLAINT:  Post Op PTX    Brief History   63 year old male with multiple pulmonary nodules admitted for iatrogenic pneumothorax s/p navigational bronchoscopy on 2/5. Chest tube dislodged on 2/8 requiring replacement. Chest tube with persistent air leak.  Significant Hospital Events   2/5 admitted for post-procedural pneumothorax 2/6 Still with significant 1-2 out of 7 airleak.  2/8 Chest tube dislodgement with hemodynamic instability requiring CT replacement 2/9-2/12 Persistent air leak 2/14 - No events overnight. Reports mild right chest pain at chest tube insertion site. He expresses desire to go home if feasible. We discussed that if his air leak remains, he will need surgical intervention first.  2./16 -  no new complaints. Lung resection fixed for Monday 09/14/2018 by Dr Sean Rose. He wants to know time. 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 He does NOT want to go with chest tube but wants it to heal enough to go home 09/13/18,. Suction turned off and no air leak noticed. So, we agreed to clamp with serial CXR and take a decision in 09/13/18 but wife not keen with this plan. She prefers he have surgery in 2d but left to his wish/decision. After lot of back and forth and him realizing that even after ptx is healed there is 2-5% chance that he can have recurrence over few weeks he decided to get suction back on and just have surgery Monday 09/14/2018   Consults:  CTS  Procedures:  Right Chest tube 2/5 >2/8 Right Chest tube 2/8>   Significant Diagnostic Tests:  Navigational Bronchoscopy 2/5 >> Transbronchial needle aspiration specimen D, navigation RUL nodule #2 brushing >> malignant cells present consistent with non-small cell carcinoma,  features slightly favor adenocarcinoma.  There is insufficient material present for additional studies   Micro Data:  2/5 Sputum Culture >>neg 2/5 Sputum  AFB >> neg smear >>  2/5 Sputum fungus >>no fungus observed  Antimicrobials:     Interim history/subjective:    09/13/2018 - small PTx on CXR +. Resection planned for 09/14/18 - 4pm. Patient frustrated by fact he has to be NPO after midnight for 4pm procedure. Wants dressing changed   Objective   Blood pressure 131/76, pulse 61, temperature 98.7 F (37.1 C), resp. rate 16, height 5\' 8"  (1.727 m), weight 58.6 kg, SpO2 98 %.        Intake/Output Summary (Last 24 hours) at 09/13/2018 1608 Last data filed at 09/13/2018 0900 Gross per 24 hour  Intake 240 ml  Output 800 ml  Net -560 ml   Filed Weights   09/02/18 0644 09/02/18 1519  Weight: 59 kg 58.6 kg     General Appearance:  Looks well. Seems more accepting of situatioj Head:  Normocephalic, without obvious abnormality, atraumatic Eyes:  PERRL - yes, conjunctiva/corneas - clear     Ears:  Normal external ear canals, both ears Nose:  G tube - n Throat:  ETT TUBE - no , OG tube - no Neck:  Supple,  No enlargement/tenderness/nodules Lungs: Clear to auscultation bilaterally, mild barrell chest Heart:  S1 and S2 normal, no murmur, CVP - no.  Pressors - no Abdomen:  Soft, no masses, no organomegaly Genitalia / Rectal:  Not  done Extremities:  Extremities- intact Skin:  ntact in exposed areas . Neurologic:  Sedation - none -> RASS - +1 . Moves all 4s - yes. CAM-ICU - neg . Orientation - x3+        LABS    PULMONARY Recent Labs  Lab 09/12/18 0708  PHART 7.455*  PCO2ART 33.7  PO2ART 70.8*  HCO3 23.4  O2SAT 94.5    CBC Recent Labs  Lab 09/12/18 0232 09/12/18 0713 09/13/18 0317  HGB 13.2 14.1 13.1  HCT 36.8* 39.6 36.9*  WBC 6.8 7.8 6.7  PLT 358 400 368    COAGULATION Recent Labs  Lab 09/12/18 0713  INR 1.07    CARDIAC  No results for input(s):  TROPONINI in the last 168 hours. No results for input(s): PROBNP in the last 168 hours.   CHEMISTRY Recent Labs  Lab 09/11/18 0311 09/11/18 0710 09/11/18 1706 09/12/18 0232 09/13/18 0317  NA 131* 130* 129* 131* 128*  K 3.9 4.3 4.5 4.7 4.3  CL 98 97* 100 96* 94*  CO2 26 25 25 23 25   GLUCOSE 106* 119* 127* 102* 100*  BUN <5* <5* <5* <5* <5*  CREATININE 0.70 0.67 0.66 0.69 0.65  CALCIUM 8.9 9.0 8.5* 9.0 8.8*  MG  --  1.7  --   --   --    Estimated Creatinine Clearance: 79.4 mL/min (by C-G formula based on SCr of 0.65 mg/dL).   LIVER Recent Labs  Lab 09/12/18 0713  INR 1.07     INFECTIOUS No results for input(s): LATICACIDVEN, PROCALCITON in the last 168 hours.   ENDOCRINE CBG (last 3)  No results for input(s): GLUCAP in the last 72 hours.       IMAGING x48h  - image(s) personally visualized  -   highlighted in bold Dg Chest Port 1 View  Result Date: 09/13/2018 CLINICAL DATA:  Follow-up pneumothorax. EXAM: PORTABLE CHEST 1 VIEW COMPARISON:  None. FINDINGS: A right chest tube is in stable position. There appears to be a small right apical pneumothorax, unchanged. Subcutaneous air in the right chest is stable. No left-sided pneumothorax. The lungs are otherwise clear. The cardiomediastinal silhouette is stable. IMPRESSION: Continued tiny a right apical pneumothorax, unchanged. Air in the right chest wall is unchanged as is the right chest tube. Electronically Signed   By: Sean Rose M.D   On: 09/13/2018 14:29   Dg Chest Port 1 View  Result Date: 09/12/2018 CLINICAL DATA:  Chest tube on right. Recent lung biopsy with air leak EXAM: PORTABLE CHEST 1 VIEW COMPARISON:  September 11, 2018 FINDINGS: Chest tube remains on the right peripherally, unchanged in position. There is persistent trace apical pneumothorax without tension component on the right. Moderate subcutaneous air remains on the right. There is no appreciable edema or consolidation. The heart size and  pulmonary vascularity are normal. No adenopathy. There is aortic atherosclerosis. No bone lesions. IMPRESSION: Stable chest tube position on the right with trace apical pneumothorax on the right. Moderate subcutaneous air remains on the right. No edema or consolidation. Stable cardiac silhouette. Aortic Atherosclerosis (ICD10-I70.0). Electronically Signed   By: Lowella Grip Rose M.D.   On: 09/12/2018 09:08     Resolved Hospital Problem list   Leukocytosis Hypokalemia  Assessment & Plan:   Iatrogenic right sided pneumothorax s/p navigational bronchoscopy    2/16  = likely air leak resolved   P: Continue chest tube with wall suction PRN pain meds  RUL Non-small cell carcinoma, features slightly favor adenocarcinoma  P: CTS following. Plan for lung resection 2/17  COPD/Emphysema PFTs 07/15/18 reviewed and demonstrate mild obstructive defect (FEV1 90%) with significant BD response, air trapping and mildly reduced DLCO. Continue home Anoro Ellipta  Hypertension and history of coronary artery disease P: Continue norvasc, lopressor  Hold plavix for now  Anxiety P: Xanax TID PRN  Nausea and vomiting, history of severe GERD Cough P: Continue home PPI  Anti-tussives Reglan PRN   Hyponatremia P: Trend BMP  Constipation P: Bowel regimen   Best practice:  Diet: Regular Pain/Anxiety/Delirium protocol (if indicated): na VAP protocol (if indicated): na DVT prophylaxis: heparin, scds -   Hold for surgery - place SCD 09/13/2018 - restsart post op ers GI prophylaxis: Home PPI Glucose control: na Mobility: up in chair Code Status: full Family Communication: Patient, wife, and son updated Disposition: Remain on floor After hours pager: [phone]     SIGNATURE    Dr. Brand Males, M.D., F.C.C.P,  Pulmonary and Critical Care Medicine Staff Physician, [place] Director - Interstitial Lung Disease  Program  [facility] [facility] [place], [place], [address]  Pager: [phone], If no answer or between  15:00h - 7:00h: call [phone] Telephone: [phone]  4:08 PM 09/13/2018

## 2018-09-14 ENCOUNTER — Inpatient Hospital Stay (HOSPITAL_COMMUNITY): Payer: BLUE CROSS/BLUE SHIELD | Admitting: Certified Registered"

## 2018-09-14 ENCOUNTER — Inpatient Hospital Stay (HOSPITAL_COMMUNITY): Payer: BLUE CROSS/BLUE SHIELD

## 2018-09-14 ENCOUNTER — Encounter (HOSPITAL_COMMUNITY): Payer: Self-pay | Admitting: Certified Registered"

## 2018-09-14 ENCOUNTER — Encounter (HOSPITAL_COMMUNITY)
Admission: AD | Disposition: A | Discharge status: Home / Self Care | Payer: Self-pay | Source: Ambulatory Visit | Attending: Thoracic Surgery (Cardiothoracic Vascular Surgery)

## 2018-09-14 DIAGNOSIS — C3491 Malignant neoplasm of unspecified part of right bronchus or lung: Secondary | ICD-10-CM | POA: Diagnosis not present

## 2018-09-14 DIAGNOSIS — I10 Essential (primary) hypertension: Secondary | ICD-10-CM | POA: Diagnosis not present

## 2018-09-14 DIAGNOSIS — E785 Hyperlipidemia, unspecified: Secondary | ICD-10-CM | POA: Diagnosis not present

## 2018-09-14 DIAGNOSIS — J95811 Postprocedural pneumothorax: Secondary | ICD-10-CM | POA: Diagnosis not present

## 2018-09-14 DIAGNOSIS — Z902 Acquired absence of lung [part of]: Secondary | ICD-10-CM

## 2018-09-14 DIAGNOSIS — C3411 Malignant neoplasm of upper lobe, right bronchus or lung: Secondary | ICD-10-CM | POA: Diagnosis not present

## 2018-09-14 DIAGNOSIS — K219 Gastro-esophageal reflux disease without esophagitis: Secondary | ICD-10-CM | POA: Diagnosis not present

## 2018-09-14 DIAGNOSIS — J939 Pneumothorax, unspecified: Secondary | ICD-10-CM | POA: Diagnosis not present

## 2018-09-14 HISTORY — PX: VIDEO ASSISTED THORACOSCOPY (VATS)/ LOBECTOMY: SHX6169

## 2018-09-14 LAB — PHOSPHORUS: Phosphorus: 4.7 mg/dL — ABNORMAL HIGH (ref 2.5–4.6)

## 2018-09-14 LAB — POCT I-STAT 7, (LYTES, BLD GAS, ICA,H+H)
Acid-base deficit: 6 mmol/L — ABNORMAL HIGH (ref 0.0–2.0)
Bicarbonate: 19.2 mmol/L — ABNORMAL LOW (ref 20.0–28.0)
Calcium, Ion: 1.05 mmol/L — ABNORMAL LOW (ref 1.15–1.40)
HCT: 28 % — ABNORMAL LOW (ref 39.0–52.0)
Hemoglobin: 9.5 g/dL — ABNORMAL LOW (ref 13.0–17.0)
O2 Saturation: 94 %
Potassium: 2.9 mmol/L — ABNORMAL LOW (ref 3.5–5.1)
SODIUM: 136 mmol/L (ref 135–145)
TCO2: 20 mmol/L — ABNORMAL LOW (ref 22–32)
pCO2 arterial: 33.8 mmHg (ref 32.0–48.0)
pH, Arterial: 7.363 (ref 7.350–7.450)
pO2, Arterial: 72 mmHg — ABNORMAL LOW (ref 83.0–108.0)

## 2018-09-14 LAB — BASIC METABOLIC PANEL
Anion gap: 10 (ref 5–15)
CO2: 24 mmol/L (ref 22–32)
Calcium: 8.7 mg/dL — ABNORMAL LOW (ref 8.9–10.3)
Chloride: 95 mmol/L — ABNORMAL LOW (ref 98–111)
Creatinine, Ser: 0.53 mg/dL — ABNORMAL LOW (ref 0.61–1.24)
GFR calc Af Amer: >60 mL/min (ref >60)
GFR calc non Af Amer: >60 mL/min (ref >60)
Glucose, Bld: 91 mg/dL (ref 70–99)
Potassium: 3.6 mmol/L (ref 3.5–5.1)
Sodium: 129 mmol/L — ABNORMAL LOW (ref 135–145)

## 2018-09-14 LAB — SURGICAL PCR SCREEN
MRSA, PCR: NEGATIVE
Staphylococcus aureus: NEGATIVE

## 2018-09-14 LAB — CBC
HCT: 36.6 % — ABNORMAL LOW (ref 39.0–52.0)
Hemoglobin: 13.1 g/dL (ref 13.0–17.0)
MCH: 35.4 pg — ABNORMAL HIGH (ref 26.0–34.0)
MCHC: 35.8 g/dL (ref 30.0–36.0)
MCV: 98.9 fL (ref 80.0–100.0)
Platelets: 375 10*3/uL (ref 150–400)
RBC: 3.7 MIL/uL — ABNORMAL LOW (ref 4.22–5.81)
RDW: 12.3 % (ref 11.5–15.5)
WBC: 6.8 10*3/uL (ref 4.0–10.5)
nRBC: 0 % (ref 0.0–0.2)

## 2018-09-14 LAB — MAGNESIUM: Magnesium: 1.4 mg/dL — ABNORMAL LOW (ref 1.7–2.4)

## 2018-09-14 SURGERY — VIDEO ASSISTED THORACOSCOPY (VATS)/ LOBECTOMY
Anesthesia: General | Site: Chest | Laterality: Right

## 2018-09-14 MED ORDER — ALBUMIN HUMAN 5 % IV SOLN
12.5000 g | Freq: Once | INTRAVENOUS | Status: AC
  Administered 2018-09-14: 12.5 g via INTRAVENOUS

## 2018-09-14 MED ORDER — HYDROCODONE-ACETAMINOPHEN 7.5-325 MG PO TABS: 1.0000 | ORAL_TABLET | Freq: Once | ORAL | Status: DC | PRN

## 2018-09-14 MED ORDER — DIPHENHYDRAMINE HCL 50 MG/ML IJ SOLN
12.5000 mg | Freq: Four times a day (QID) | INTRAMUSCULAR | Status: DC | PRN
  Filled 2018-09-14: qty 0.25

## 2018-09-14 MED ORDER — SUGAMMADEX SODIUM 200 MG/2ML IV SOLN
INTRAVENOUS | Status: DC | PRN
  Administered 2018-09-14: 150 mg via INTRAVENOUS

## 2018-09-14 MED ORDER — FENTANYL CITRATE (PF) 100 MCG/2ML IJ SOLN
INTRAMUSCULAR | Status: DC | PRN
  Administered 2018-09-14: 100 ug via INTRAVENOUS
  Administered 2018-09-14 (×2): 50 ug via INTRAVENOUS

## 2018-09-14 MED ORDER — SODIUM CHLORIDE 0.9% FLUSH: 9.0000 mL | INTRAVENOUS | Status: DC | PRN

## 2018-09-14 MED ORDER — NALOXONE HCL 0.4 MG/ML IJ SOLN
0.4000 mg | INTRAMUSCULAR | Status: DC | PRN
  Filled 2018-09-14: qty 1

## 2018-09-14 MED ORDER — LACTATED RINGERS IV SOLN
INTRAVENOUS | Status: DC
  Administered 2018-09-14 (×2): via INTRAVENOUS

## 2018-09-14 MED ORDER — DEXAMETHASONE SODIUM PHOSPHATE 4 MG/ML IJ SOLN
INTRAMUSCULAR | Status: DC | PRN
  Administered 2018-09-14: 5 mg via INTRAVENOUS

## 2018-09-14 MED ORDER — ONDANSETRON HCL 4 MG/2ML IJ SOLN
INTRAMUSCULAR | Status: DC | PRN
  Administered 2018-09-14: 4 mg via INTRAVENOUS

## 2018-09-14 MED ORDER — MIDAZOLAM HCL 5 MG/5ML IJ SOLN
INTRAMUSCULAR | Status: DC | PRN
  Administered 2018-09-14: 2 mg via INTRAVENOUS

## 2018-09-14 MED ORDER — PHENYLEPHRINE 40 MCG/ML (10ML) SYRINGE FOR IV PUSH (FOR BLOOD PRESSURE SUPPORT)
PREFILLED_SYRINGE | INTRAVENOUS | Status: AC
  Filled 2018-09-14: qty 10

## 2018-09-14 MED ORDER — BUPIVACAINE HCL (PF) 0.5 % IJ SOLN
INTRAMUSCULAR | Status: AC
  Filled 2018-09-14: qty 30

## 2018-09-14 MED ORDER — BUPIVACAINE LIPOSOME 1.3 % IJ SUSP: 20.0000 mL | INTRAMUSCULAR | Status: DC

## 2018-09-14 MED ORDER — ACETAMINOPHEN 10 MG/ML IV SOLN: 1000.0000 mg | Freq: Once | INTRAVENOUS | Status: DC | PRN

## 2018-09-14 MED ORDER — ROCURONIUM BROMIDE 100 MG/10ML IV SOLN
INTRAVENOUS | Status: DC | PRN
  Administered 2018-09-14: 50 mg via INTRAVENOUS
  Administered 2018-09-14: 20 mg via INTRAVENOUS
  Administered 2018-09-14: 30 mg via INTRAVENOUS

## 2018-09-14 MED ORDER — BUPIVACAINE LIPOSOME 1.3 % IJ SUSP
20.0000 mL | INTRAMUSCULAR | Status: DC
  Filled 2018-09-14: qty 10
  Filled 2018-09-14 (×2): qty 20

## 2018-09-14 MED ORDER — FENTANYL CITRATE (PF) 250 MCG/5ML IJ SOLN
INTRAMUSCULAR | Status: AC
  Filled 2018-09-14: qty 5

## 2018-09-14 MED ORDER — DIPHENHYDRAMINE HCL 12.5 MG/5ML PO ELIX
12.5000 mg | ORAL_SOLUTION | Freq: Four times a day (QID) | ORAL | Status: DC | PRN
  Filled 2018-09-14: qty 5

## 2018-09-14 MED ORDER — LIDOCAINE 2% (20 MG/ML) 5 ML SYRINGE
INTRAMUSCULAR | Status: DC | PRN
  Administered 2018-09-14: 100 mg via INTRAVENOUS

## 2018-09-14 MED ORDER — HYDROMORPHONE HCL 1 MG/ML IJ SOLN
INTRAMUSCULAR | Status: AC
  Filled 2018-09-14: qty 1

## 2018-09-14 MED ORDER — HEMOSTATIC AGENTS (NO CHARGE) OPTIME
TOPICAL | Status: DC | PRN
  Administered 2018-09-14: 1 via TOPICAL

## 2018-09-14 MED ORDER — MAGNESIUM SULFATE 2 GM/50ML IV SOLN
2.0000 g | Freq: Once | INTRAVENOUS | Status: AC
  Administered 2018-09-14: 2 g via INTRAVENOUS
  Filled 2018-09-14: qty 50

## 2018-09-14 MED ORDER — 0.9 % SODIUM CHLORIDE (POUR BTL) OPTIME
TOPICAL | Status: DC | PRN
  Administered 2018-09-14: 1000 mL

## 2018-09-14 MED ORDER — BUPIVACAINE LIPOSOME 1.3 % IJ SUSP
INTRAMUSCULAR | Status: DC | PRN
  Administered 2018-09-14: 50 mL

## 2018-09-14 MED ORDER — ALBUMIN HUMAN 5 % IV SOLN
INTRAVENOUS | Status: AC
  Administered 2018-09-14: 12.5 g via INTRAVENOUS
  Filled 2018-09-14: qty 250

## 2018-09-14 MED ORDER — BUPIVACAINE LIPOSOME 1.3 % IJ SUSP
20.0000 mL | INTRAMUSCULAR | Status: DC
  Filled 2018-09-14: qty 20

## 2018-09-14 MED ORDER — HYDROMORPHONE 1 MG/ML IV SOLN
INTRAVENOUS | Status: DC
  Administered 2018-09-14: 0.6 mg via INTRAVENOUS
  Administered 2018-09-14: 25 mg via INTRAVENOUS
  Administered 2018-09-14: 0.6 mg via INTRAVENOUS
  Administered 2018-09-15: 0.9 mg via INTRAVENOUS
  Administered 2018-09-15 (×2): 0.3 mg via INTRAVENOUS
  Administered 2018-09-15: 0 mg via INTRAVENOUS
  Administered 2018-09-16: 2.4 mg via INTRAVENOUS
  Administered 2018-09-16: 4.5 mg via INTRAVENOUS
  Filled 2018-09-14: qty 25

## 2018-09-14 MED ORDER — PROPOFOL 10 MG/ML IV BOLUS
INTRAVENOUS | Status: AC
  Filled 2018-09-14: qty 20

## 2018-09-14 MED ORDER — HYDROMORPHONE HCL 1 MG/ML IJ SOLN
0.2500 mg | INTRAMUSCULAR | Status: DC | PRN
  Administered 2018-09-14 (×2): 0.5 mg via INTRAVENOUS

## 2018-09-14 MED ORDER — ONDANSETRON HCL 4 MG/2ML IJ SOLN
4.0000 mg | Freq: Four times a day (QID) | INTRAMUSCULAR | Status: DC | PRN
  Filled 2018-09-14: qty 2

## 2018-09-14 MED ORDER — SODIUM CHLORIDE 0.9 % IV SOLN
INTRAVENOUS | Status: DC | PRN
  Administered 2018-09-14: 25 ug/min via INTRAVENOUS

## 2018-09-14 MED ORDER — PROPOFOL 10 MG/ML IV BOLUS
INTRAVENOUS | Status: DC | PRN
  Administered 2018-09-14: 100 mg via INTRAVENOUS

## 2018-09-14 MED ORDER — MIDAZOLAM HCL 2 MG/2ML IJ SOLN
INTRAMUSCULAR | Status: AC
  Filled 2018-09-14: qty 2

## 2018-09-14 SURGICAL SUPPLY — 98 items
ADH SKN CLS APL DERMABOND .7 (GAUZE/BANDAGES/DRESSINGS) ×1
APL SWBSTK 6 STRL LF DISP (MISCELLANEOUS) ×1
APPLICATOR COTTON TIP 6 STRL (MISCELLANEOUS) IMPLANT
APPLICATOR COTTON TIP 6IN STRL (MISCELLANEOUS) ×3 IMPLANT
APPLIER CLIP ROT 10 11.4 M/L (STAPLE)
APR CLP MED LRG 11.4X10 (STAPLE)
BAG SPEC RTRVL LRG 6X4 10 (ENDOMECHANICALS)
CANISTER SUCT 3000ML PPV (MISCELLANEOUS) ×3 IMPLANT
CATH THORACIC 28FR (CATHETERS) IMPLANT
CATH THORACIC 28FR RT ANG (CATHETERS) IMPLANT
CATH THORACIC 36FR (CATHETERS) IMPLANT
CATH THORACIC 36FR RT ANG (CATHETERS) IMPLANT
CLIP APPLIE ROT 10 11.4 M/L (STAPLE) IMPLANT
CLIP VESOCCLUDE MED 6/CT (CLIP) ×5 IMPLANT
CONN ST 1/4X3/8  BEN (MISCELLANEOUS)
CONN ST 1/4X3/8 BEN (MISCELLANEOUS) IMPLANT
CONN Y 3/8X3/8X3/8  BEN (MISCELLANEOUS)
CONN Y 3/8X3/8X3/8 BEN (MISCELLANEOUS) IMPLANT
CONT SPEC 4OZ CLIKSEAL STRL BL (MISCELLANEOUS) ×20 IMPLANT
COVER SURGICAL LIGHT HANDLE (MISCELLANEOUS) IMPLANT
COVER WAND RF STERILE (DRAPES) ×3 IMPLANT
DERMABOND ADVANCED (GAUZE/BANDAGES/DRESSINGS) ×2
DERMABOND ADVANCED .7 DNX12 (GAUZE/BANDAGES/DRESSINGS) IMPLANT
DRAIN CHANNEL 28F RND 3/8 FF (WOUND CARE) IMPLANT
DRAIN CHANNEL 32F RND 10.7 FF (WOUND CARE) IMPLANT
DRAPE CV SPLIT W-CLR ANES SCRN (DRAPES) ×3 IMPLANT
DRAPE INCISE IOBAN 66X45 STRL (DRAPES) ×2 IMPLANT
DRAPE ORTHO SPLIT 77X108 STRL (DRAPES) ×3
DRAPE SURG ORHT 6 SPLT 77X108 (DRAPES) ×1 IMPLANT
DRAPE WARM FLUID 44X44 (DRAPE) ×3 IMPLANT
ELECT BLADE 6.5 EXT (BLADE) ×3 IMPLANT
ELECT REM PT RETURN 9FT ADLT (ELECTROSURGICAL) ×3
ELECTRODE REM PT RTRN 9FT ADLT (ELECTROSURGICAL) ×1 IMPLANT
GAUZE SPONGE 4X4 12PLY STRL (GAUZE/BANDAGES/DRESSINGS) ×3 IMPLANT
GAUZE SPONGE 4X4 12PLY STRL LF (GAUZE/BANDAGES/DRESSINGS) ×2 IMPLANT
GLOVE SURG SIGNA 7.5 PF LTX (GLOVE) ×6 IMPLANT
GOWN STRL REUS W/ TWL LRG LVL3 (GOWN DISPOSABLE) ×2 IMPLANT
GOWN STRL REUS W/ TWL XL LVL3 (GOWN DISPOSABLE) ×1 IMPLANT
GOWN STRL REUS W/TWL LRG LVL3 (GOWN DISPOSABLE) ×6
GOWN STRL REUS W/TWL XL LVL3 (GOWN DISPOSABLE) ×3
HEMOSTAT SURGICEL 2X14 (HEMOSTASIS) IMPLANT
KIT BASIN OR (CUSTOM PROCEDURE TRAY) ×3 IMPLANT
KIT SUCTION CATH 14FR (SUCTIONS) IMPLANT
KIT TURNOVER KIT B (KITS) ×3 IMPLANT
NDL HYPO 25GX1X1/2 BEV (NEEDLE) ×1 IMPLANT
NDL SPNL 18GX3.5 QUINCKE PK (NEEDLE) ×1 IMPLANT
NEEDLE HYPO 25GX1X1/2 BEV (NEEDLE) ×3 IMPLANT
NEEDLE SPNL 18GX3.5 QUINCKE PK (NEEDLE) ×3 IMPLANT
NS IRRIG 1000ML POUR BTL (IV SOLUTION) ×9 IMPLANT
PACK CHEST (CUSTOM PROCEDURE TRAY) ×3 IMPLANT
PAD ARMBOARD 7.5X6 YLW CONV (MISCELLANEOUS) ×6 IMPLANT
POUCH ENDO CATCH II 15MM (MISCELLANEOUS) ×2 IMPLANT
POUCH SPECIMEN RETRIEVAL 10MM (ENDOMECHANICALS) IMPLANT
RELOAD STAPLE 35X2.5 WHT THIN (STAPLE) IMPLANT
RELOAD STAPLE 60 3.8 GOLD REG (STAPLE) IMPLANT
RELOAD STAPLE 60 4.1 GRN THCK (STAPLE) IMPLANT
RELOAD STAPLER GOLD 60MM (STAPLE) ×9 IMPLANT
RELOAD STAPLER GREEN 60MM (STAPLE) ×3 IMPLANT
SCISSORS ENDO CVD 5DCS (MISCELLANEOUS) IMPLANT
SEALANT PROGEL (MISCELLANEOUS) IMPLANT
SEALANT SURG COSEAL 4ML (VASCULAR PRODUCTS) IMPLANT
SEALANT SURG COSEAL 8ML (VASCULAR PRODUCTS) IMPLANT
SHEARS HARMONIC HDI 20CM (ELECTROSURGICAL) ×2 IMPLANT
SOLUTION ANTI FOG 6CC (MISCELLANEOUS) ×3 IMPLANT
SPECIMEN JAR MEDIUM (MISCELLANEOUS) IMPLANT
SPONGE INTESTINAL PEANUT (DISPOSABLE) IMPLANT
SPONGE TONSIL TAPE 1 RFD (DISPOSABLE) ×3 IMPLANT
STAPLE ECHEON FLEX 60 POW ENDO (STAPLE) ×2 IMPLANT
STAPLE RELOAD 2.5MM WHITE (STAPLE) ×24 IMPLANT
STAPLER RELOAD GOLD 60MM (STAPLE) ×27
STAPLER RELOAD GREEN 60MM (STAPLE) ×9
STAPLER VASCULAR ECHELON 35 (CUTTER) ×2 IMPLANT
SUT PROLENE 4 0 RB 1 (SUTURE)
SUT PROLENE 4-0 RB1 .5 CRCL 36 (SUTURE) IMPLANT
SUT SILK  1 MH (SUTURE) ×4
SUT SILK 1 MH (SUTURE) ×2 IMPLANT
SUT SILK 1 TIES 10X30 (SUTURE) ×3 IMPLANT
SUT SILK 2 0 SH (SUTURE) IMPLANT
SUT SILK 2 0SH CR/8 30 (SUTURE) IMPLANT
SUT SILK 3 0 SH 30 (SUTURE) IMPLANT
SUT SILK 3 0SH CR/8 30 (SUTURE) ×3 IMPLANT
SUT VIC AB 1 CTX 36 (SUTURE) ×3
SUT VIC AB 1 CTX36XBRD ANBCTR (SUTURE) ×1 IMPLANT
SUT VIC AB 2-0 CTX 36 (SUTURE) ×3 IMPLANT
SUT VIC AB 3-0 MH 27 (SUTURE) IMPLANT
SUT VIC AB 3-0 X1 27 (SUTURE) ×5 IMPLANT
SUT VICRYL 2 TP 1 (SUTURE) IMPLANT
SYR 10ML LL (SYRINGE) ×3 IMPLANT
SYR 30ML LL (SYRINGE) ×3 IMPLANT
SYSTEM SAHARA CHEST DRAIN ATS (WOUND CARE) ×3 IMPLANT
TAPE CLOTH 4X10 WHT NS (GAUZE/BANDAGES/DRESSINGS) ×3 IMPLANT
TAPE CLOTH SURG 6X10 WHT LF (GAUZE/BANDAGES/DRESSINGS) ×2 IMPLANT
TIP APPLICATOR SPRAY EXTEND 16 (VASCULAR PRODUCTS) IMPLANT
TOWEL GREEN STERILE (TOWEL DISPOSABLE) ×3 IMPLANT
TOWEL GREEN STERILE FF (TOWEL DISPOSABLE) ×3 IMPLANT
TRAY FOLEY MTR SLVR 16FR STAT (SET/KITS/TRAYS/PACK) ×3 IMPLANT
TROCAR XCEL BLADELESS 5X75MML (TROCAR) ×3 IMPLANT
WATER STERILE IRR 1000ML POUR (IV SOLUTION) ×3 IMPLANT

## 2018-09-14 NOTE — Anesthesia Procedure Notes (Signed)
Central Venous Catheter Insertion Performed by: Catalina Gravel, MD, anesthesiologist Start/End2/17/2020 2:05 PM, 09/14/2018 2:15 PM Patient location: Pre-op. Preanesthetic checklist: patient identified, IV checked, site marked, risks and benefits discussed, surgical consent, monitors and equipment checked, pre-op evaluation, timeout performed and anesthesia consent Position: Trendelenburg Lidocaine 1% used for infiltration and patient sedated Hand hygiene performed , maximum sterile barriers used  and Seldinger technique used Catheter size: 8 Fr Total catheter length 16. Central line was placed.Double lumen Procedure performed using ultrasound guided technique. Ultrasound Notes:anatomy identified, needle tip was noted to be adjacent to the nerve/plexus identified, no ultrasound evidence of intravascular and/or intraneural injection and image(s) printed for medical record Attempts: 1 Following insertion, dressing applied, line sutured and Biopatch. Post procedure assessment: blood return through all ports, no air and free fluid flow  Patient tolerated the procedure well with no immediate complications.

## 2018-09-14 NOTE — Anesthesia Procedure Notes (Signed)
Procedure Name: Intubation Date/Time: 09/14/2018 2:45 PM Performed by: Lavell Luster, CRNA Pre-anesthesia Checklist: Patient identified, Emergency Drugs available, Suction available, Patient being monitored and Timeout performed Patient Re-evaluated:Patient Re-evaluated prior to induction Oxygen Delivery Method: Circle system utilized Preoxygenation: Pre-oxygenation with 100% oxygen Induction Type: IV induction Ventilation: Mask ventilation without difficulty Laryngoscope Size: Mac and 4 Grade View: Grade I Tube type: Oral Endobronchial tube: EBT position confirmed by auscultation and EBT position confirmed by fiberoptic bronchoscope and 39 Fr Number of attempts: 1 Airway Equipment and Method: Stylet and Fiberoptic brochoscope Placement Confirmation: ETT inserted through vocal cords under direct vision,  positive ETCO2 and breath sounds checked- equal and bilateral Dental Injury: Teeth and Oropharynx as per pre-operative assessment  Difficulty Due To: Difficulty was anticipated and Difficult Airway- due to anterior larynx

## 2018-09-14 NOTE — Plan of Care (Signed)

## 2018-09-14 NOTE — Transfer of Care (Signed)
Immediate Anesthesia Transfer of Care Note  Patient: [name]  Procedure(s) Performed: RIGHT VIDEO ASSISTED THORACOSCOPY (VATS)/ RIGHT UPPER LOBECTOMY (Right Chest)  Patient Location: PACU  Anesthesia Type:General  Level of Consciousness: awake, alert , oriented and patient cooperative  Airway & Oxygen Therapy: Patient Spontanous Breathing  Post-op Assessment: Report given to RN and Post -op Vital signs reviewed and stable  Post vital signs: Reviewed and stable  Last Vitals:  Vitals Value Taken Time  BP 103/77 09/14/2018  5:51 PM  Temp    Pulse 65 09/14/2018  5:52 PM  Resp 18 09/14/2018  5:52 PM  SpO2 97 % 09/14/2018  5:52 PM  Vitals shown include unvalidated device data.  Last Pain:  Vitals:   09/14/18 0812  TempSrc: Oral  PainSc:       Patients Stated Pain Goal: 2 (81/02/54 8628)  Complications: No apparent anesthesia complications

## 2018-09-14 NOTE — Anesthesia Preprocedure Evaluation (Signed)
Anesthesia Evaluation  Patient identified by MRN, date of birth, ID band Patient awake    Reviewed: Allergy & Precautions, NPO status , Patient's Chart, lab work & pertinent test results, reviewed documented beta blocker date and time   Airway Mallampati: II  TM Distance: >3 FB Neck ROM: Full    Dental  (+) Teeth Intact, Dental Advisory Given   Pulmonary COPD, Current Smoker,  63 year old male with multiple pulmonary nodules admitted for iatrogenic pneumothorax s/p navigational bronchoscopy on 2/5. Chest tube dislodged on 2/8 requiring replacement. Chest tube with persistent air leak.   Pulmonary exam normal breath sounds clear to auscultation       Cardiovascular hypertension, Pt. on home beta blockers and Pt. on medications + angina + CAD and + Peripheral Vascular Disease  Normal cardiovascular exam Rhythm:Regular Rate:Normal     Neuro/Psych negative neurological ROS     GI/Hepatic negative GI ROS, Neg liver ROS,   Endo/Other  negative endocrine ROS  Renal/GU negative Renal ROS     Musculoskeletal negative musculoskeletal ROS (+)   Abdominal   Peds  Hematology  (+) Blood dyscrasia (Plavix), ,   Anesthesia Other Findings Day of surgery medications reviewed with the patient.  Reproductive/Obstetrics                            Anesthesia Physical Anesthesia Plan  ASA: III  Anesthesia Plan: General   Post-op Pain Management:    Induction: Intravenous  PONV Risk Score and Plan: 2 and Ondansetron, Dexamethasone and Midazolam  Airway Management Planned: Double Lumen EBT  Additional Equipment: Arterial line, CVP and Ultrasound Guidance Line Placement  Intra-op Plan:   Post-operative Plan: Extubation in OR  Informed Consent: I have reviewed the patients History and Physical, chart, labs and discussed the procedure including the risks, benefits and alternatives for the proposed  anesthesia with the patient or authorized representative who has indicated his/her understanding and acceptance.     Dental advisory given  Plan Discussed with: CRNA  Anesthesia Plan Comments:         Anesthesia Quick Evaluation

## 2018-09-14 NOTE — Interval H&P Note (Signed)
History and Physical Interval Note:  09/14/2018 1:59 PM  Sean Rose  has presented today for surgery, with the diagnosis of Lung nodules  The various methods of treatment have been discussed with the patient and family. After consideration of risks, benefits and other options for treatment, the patient has consented to  Procedure(s): RIGHT VIDEO ASSISTED THORACOSCOPY (VATS)/ RIGHT UPPER LOBECTOMY (Right) as a surgical intervention .  The patient's history has been reviewed, patient examined, no change in status, stable for surgery.  I have reviewed the patient's chart and labs.  Questions were answered to the patient's satisfaction.     Melrose Nakayama

## 2018-09-14 NOTE — Brief Op Note (Signed)
09/02/2018 - 09/14/2018  5:12 PM  PATIENT:  [name]  63 y.o. male  PRE-OPERATIVE DIAGNOSIS:  Lung nodules  POST-OPERATIVE DIAGNOSIS:  Lung nodules  PROCEDURE:  Procedure(s): RIGHT VIDEO ASSISTED THORACOSCOPY (VATS)/ RIGHT UPPER LOBECTOMY (Right)  SURGEON:  Surgeon(s) and Role:    * Melrose Nakayama, MD - Primary  PHYSICIAN ASSISTANT: M. Antonea Gaut, PA-C   ANESTHESIA:   general  EBL:  50 mL   BLOOD ADMINISTERED:none  DRAINS: Rigth pleural tube.   LOCAL MEDICATIONS USED:  BUPIVICAINE   SPECIMEN:  Source of Specimen:  Right upper lung lobe. Multiple lymph nodes.  DISPOSITION OF SPECIMEN:  PATHOLOGY  COUNTS:  YES  DICTATION: .Dragon Dictation  PLAN OF CARE: Admit to inpatient   PATIENT DISPOSITION:  PACU - hemodynamically stable.   Delay start of Pharmacological VTE agent (>24hrs) due to surgical blood loss or risk of bleeding: no

## 2018-09-14 NOTE — Anesthesia Procedure Notes (Signed)
Date/Time: 09/14/2018 2:51 PM Performed by: Lavell Luster, CRNA

## 2018-09-14 NOTE — Anesthesia Postprocedure Evaluation (Signed)
Anesthesia Post Note  Patient: Sean Rose  Procedure(s) Performed: RIGHT VIDEO ASSISTED THORACOSCOPY (VATS)/ RIGHT UPPER LOBECTOMY (Right Chest)     Patient location during evaluation: PACU Anesthesia Type: General Level of consciousness: awake and alert Pain management: pain level controlled Vital Signs Assessment: post-procedure vital signs reviewed and stable Respiratory status: spontaneous breathing, nonlabored ventilation, respiratory function stable and patient connected to nasal cannula oxygen Cardiovascular status: blood pressure returned to baseline and stable Postop Assessment: no apparent nausea or vomiting Anesthetic complications: no    Last Vitals:  Vitals:   09/14/18 1957 09/14/18 2026  BP: 132/88   Pulse: 72   Resp: 19 16  Temp: 36.6 C   SpO2: 95% 94%    Last Pain:  Vitals:   09/14/18 2026  TempSrc:   PainSc: 4                  Catalina Gravel

## 2018-09-14 NOTE — Progress Notes (Signed)
.   [name], MRN:  [id number], DOB:  1956-06-14, LOS: 1 ADMISSION DATE:  09/02/2018, CONSULTATION DATE:  2/5 REFERRING MD:  Dr. Valeta Harms, CHIEF COMPLAINT:  Post Op PTX    Brief History   63 year old male with multiple pulmonary nodules admitted for iatrogenic pneumothorax s/p navigational bronchoscopy on 2/5. Chest tube dislodged on 2/8 requiring replacement. Chest tube with persistent air leak.  Significant Hospital Events   2/5 admitted for post-procedural pneumothorax 2/6 Still with significant 1-2 out of 7 airleak.  2/8 Chest tube dislodgement with hemodynamic instability requiring CT replacement 2/9-2/12 Persistent air leak 2/14 - No events overnight. Reports mild right chest pain at chest tube insertion site. He expresses desire to go home if feasible. We discussed that if his air leak remains, he will need surgical intervention first. 2/16 -  no new complaints. Lung resection fixed for Monday 09/14/2018 by Dr Roxan Hockey.  2/17 lung resection planned  Consults:  CTS  Procedures:  Right Chest tube 2/5 >2/8 Right Chest tube 2/8>   Significant Diagnostic Tests:  Navigational Bronchoscopy 2/5 >> Transbronchial needle aspiration specimen D, navigation RUL nodule #2 brushing >> malignant cells present consistent with non-small cell carcinoma, features slightly favor adenocarcinoma.  There is insufficient material present for additional studies   Micro Data:  2/5 Sputum Culture >>neg 2/5 Sputum  AFB >> neg smear >>  2/5 Sputum fungus >>no fungus observed  Antimicrobials:     Interim history/subjective:  No complaints.  Anxious to get surgery over with.  Objective   Blood pressure 109/66, pulse (!) 55, temperature 98 F (36.7 C), temperature source Oral, resp. rate 18, height 5\' 8"  (1.727 m), weight 58.6 kg, SpO2 97 %.        Intake/Output Summary (Last 24 hours) at 09/14/2018 1033 Last data filed at 09/13/2018 2045 Gross per 24 hour  Intake 480 ml  Output 300  ml  Net 180 ml   Filed Weights   09/02/18 0644 09/02/18 1519  Weight: 59 kg 58.6 kg    Physical Exam: General: Adult male, visiting with wife at bedside, in NAD. Neuro: A&O x 3, no deficits. HEENT: [place]/AT. Sclerae anicteric. EOMI. Cardiovascular: RRR, no M/R/G. Lungs: Respirations even and unlabored.  CTA bilaterally, No W/R/R.  Right chest tube in place with no air leak. Abdomen: BS x 4, soft, NT/ND.  Musculoskeletal: No gross deformities, no edema.  Skin: Intact, warm, no rashes.   Assessment & Plan:   Iatrogenic right sided pneumothorax s/p navigational bronchoscopy - with persistent air leak since chest tube insertion. P: Continue chest tube with wall suction PRN pain meds To OR today for lung resection  RUL Non-small cell carcinoma, features slightly favor adenocarcinoma P: CTS following. Plan for lung resection this afternoon 2/17  COPD/Emphysema - PFTs 07/15/18 reviewed and demonstrate mild obstructive defect (FEV1 90%) with significant BD response, air trapping and mildly reduced DLCO. P: Continue home Anoro Ellipta  Hypertension and history of coronary artery disease P: Continue norvasc, lopressor  Hold plavix for now  Anxiety P: Xanax TID PRN  Nausea and vomiting, history of severe GERD Cough P: Continue home PPI  Anti-tussives Reglan PRN   Hyponatremia P: Trend BMP  Constipation P: Bowel regimen  Hypomagnesemia. P: 2g Mag.  Best practice:  Diet: NPO now for surgery this PM Pain/Anxiety/Delirium protocol (if indicated): Dilaudid PRN / oxy IR PRN VAP protocol (if indicated): na DVT prophylaxis: heparin, scds -   Hold for surgery - place SCD  2/16 and restart heparin 2/18 post op GI prophylaxis: Home PPI Glucose control: na Mobility: up in chair Code Status: full Family Communication: Patient and wife updated 2/17. Disposition: Tele   Montey Hora, PA - [facility] Pager: [phone].  If no  answer, [phone] 09/14/2018, 12:00 PM

## 2018-09-14 NOTE — Anesthesia Procedure Notes (Signed)
Arterial Line Insertion Start/End2/17/2020 2:14 PM, 09/14/2018 2:15 PM Performed by: CRNA  Patient location: Pre-op. Preanesthetic checklist: patient identified, IV checked, risks and benefits discussed, surgical consent, monitors and equipment checked, pre-op evaluation and timeout performed Patient sedated radial was placed Catheter size: 20 G Hand hygiene performed , maximum sterile barriers used  and Seldinger technique used Allen's test indicative of satisfactory collateral circulation Attempts: 1 Procedure performed without using ultrasound guided technique. Following insertion, dressing applied and Biopatch. Post procedure assessment: normal  Patient tolerated the procedure well with no immediate complications. Additional procedure comments: Inserted by Heywood Iles, SRNA.

## 2018-09-15 ENCOUNTER — Inpatient Hospital Stay (HOSPITAL_COMMUNITY): Payer: BLUE CROSS/BLUE SHIELD

## 2018-09-15 ENCOUNTER — Encounter (HOSPITAL_COMMUNITY): Payer: Self-pay | Admitting: Thoracic Surgery (Cardiothoracic Vascular Surgery)

## 2018-09-15 DIAGNOSIS — Z902 Acquired absence of lung [part of]: Secondary | ICD-10-CM

## 2018-09-15 LAB — CBC
HEMATOCRIT: 33 % — AB (ref 39.0–52.0)
Hemoglobin: 11.7 g/dL — ABNORMAL LOW (ref 13.0–17.0)
MCH: 35.6 pg — ABNORMAL HIGH (ref 26.0–34.0)
MCHC: 35.5 g/dL (ref 30.0–36.0)
MCV: 100.3 fL — ABNORMAL HIGH (ref 80.0–100.0)
Platelets: 343 10*3/uL (ref 150–400)
RBC: 3.29 MIL/uL — ABNORMAL LOW (ref 4.22–5.81)
RDW: 12.5 % (ref 11.5–15.5)
WBC: 17.1 10*3/uL — ABNORMAL HIGH (ref 4.0–10.5)
nRBC: 0 % (ref 0.0–0.2)

## 2018-09-15 LAB — BLOOD GAS, ARTERIAL
Acid-Base Excess: 1.7 mmol/L (ref 0.0–2.0)
Bicarbonate: 25 mmol/L (ref 20.0–28.0)
Drawn by: 244901
FIO2: 21
O2 Saturation: 94.1 %
Patient temperature: 98.6
pCO2 arterial: 34.5 mmHg (ref 32.0–48.0)
pH, Arterial: 7.474 — ABNORMAL HIGH (ref 7.350–7.450)
pO2, Arterial: 68.4 mmHg — ABNORMAL LOW (ref 83.0–108.0)

## 2018-09-15 LAB — BASIC METABOLIC PANEL
Anion gap: 9 (ref 5–15)
Anion gap: 9 (ref 5–15)
BUN: <5 mg/dL — ABNORMAL LOW (ref 8–23)
BUN: <5 mg/dL — ABNORMAL LOW (ref 8–23)
CHLORIDE: 93 mmol/L — AB (ref 98–111)
CHLORIDE: 96 mmol/L — AB (ref 98–111)
CO2: 22 mmol/L (ref 22–32)
CO2: 22 mmol/L (ref 22–32)
Calcium: 8.1 mg/dL — ABNORMAL LOW (ref 8.9–10.3)
Calcium: 8.4 mg/dL — ABNORMAL LOW (ref 8.9–10.3)
Creatinine, Ser: 0.56 mg/dL — ABNORMAL LOW (ref 0.61–1.24)
Creatinine, Ser: 0.68 mg/dL (ref 0.61–1.24)
GFR calc Af Amer: >60 mL/min (ref >60)
GFR calc Af Amer: >60 mL/min (ref >60)
GFR calc non Af Amer: >60 mL/min (ref >60)
GFR calc non Af Amer: >60 mL/min (ref >60)
Glucose, Bld: 133 mg/dL — ABNORMAL HIGH (ref 70–99)
Glucose, Bld: 176 mg/dL — ABNORMAL HIGH (ref 70–99)
POTASSIUM: 3.5 mmol/L (ref 3.5–5.1)
Potassium: 3.6 mmol/L (ref 3.5–5.1)
SODIUM: 124 mmol/L — AB (ref 135–145)
Sodium: 127 mmol/L — ABNORMAL LOW (ref 135–145)

## 2018-09-15 LAB — MAGNESIUM: Magnesium: 1.8 mg/dL (ref 1.7–2.4)

## 2018-09-15 LAB — PHOSPHORUS: Phosphorus: 3.4 mg/dL (ref 2.5–4.6)

## 2018-09-15 MED ORDER — SODIUM CHLORIDE 0.9 % IV SOLN
INTRAVENOUS | Status: DC
  Administered 2018-09-15: 08:00:00 via INTRAVENOUS

## 2018-09-15 MED ORDER — OXYCODONE HCL 5 MG PO TABS
5.0000 mg | ORAL_TABLET | ORAL | Status: DC | PRN
  Administered 2018-09-15: 10 mg via ORAL
  Administered 2018-09-19 – 2018-09-23 (×3): 5 mg via ORAL
  Filled 2018-09-15: qty 2
  Filled 2018-09-15 (×3): qty 1

## 2018-09-15 MED ORDER — LEVALBUTEROL HCL 0.63 MG/3ML IN NEBU
0.6300 mg | INHALATION_SOLUTION | Freq: Four times a day (QID) | RESPIRATORY_TRACT | Status: DC
  Administered 2018-09-15: 0.63 mg via RESPIRATORY_TRACT
  Filled 2018-09-15 (×2): qty 3

## 2018-09-15 MED ORDER — ACETAMINOPHEN 500 MG PO TABS
1000.0000 mg | ORAL_TABLET | Freq: Four times a day (QID) | ORAL | Status: AC
  Administered 2018-09-15 – 2018-09-19 (×13): 1000 mg via ORAL
  Filled 2018-09-15 (×16): qty 2

## 2018-09-15 MED ORDER — TRAMADOL HCL 50 MG PO TABS
50.0000 mg | ORAL_TABLET | Freq: Four times a day (QID) | ORAL | Status: DC | PRN
  Administered 2018-09-15 – 2018-09-23 (×8): 100 mg via ORAL
  Filled 2018-09-15 (×8): qty 2

## 2018-09-15 MED ORDER — POTASSIUM CHLORIDE 10 MEQ/50ML IV SOLN
10.0000 meq | Freq: Every day | INTRAVENOUS | Status: DC | PRN
  Administered 2018-09-15 (×3): 10 meq via INTRAVENOUS
  Filled 2018-09-15 (×3): qty 50

## 2018-09-15 MED ORDER — HYDRALAZINE HCL 20 MG/ML IJ SOLN: 10.0000 mg | Freq: Four times a day (QID) | INTRAMUSCULAR | Status: DC | PRN

## 2018-09-15 MED ORDER — CEFAZOLIN SODIUM-DEXTROSE 2-4 GM/100ML-% IV SOLN
2.0000 g | Freq: Three times a day (TID) | INTRAVENOUS | Status: AC
  Administered 2018-09-15 (×2): 2 g via INTRAVENOUS
  Filled 2018-09-15 (×2): qty 100

## 2018-09-15 MED ORDER — NICOTINE 14 MG/24HR TD PT24
14.0000 mg | MEDICATED_PATCH | Freq: Every day | TRANSDERMAL | Status: DC
  Administered 2018-09-15 – 2018-09-24 (×9): 14 mg via TRANSDERMAL
  Filled 2018-09-15 (×10): qty 1

## 2018-09-15 MED ORDER — ASPIRIN EC 81 MG PO TBEC
81.0000 mg | DELAYED_RELEASE_TABLET | Freq: Every day | ORAL | Status: DC
  Administered 2018-09-15 – 2018-09-24 (×10): 81 mg via ORAL
  Filled 2018-09-15 (×10): qty 1

## 2018-09-15 MED ORDER — LEVALBUTEROL HCL 0.63 MG/3ML IN NEBU
0.6300 mg | INHALATION_SOLUTION | Freq: Four times a day (QID) | RESPIRATORY_TRACT | Status: DC | PRN
  Administered 2018-09-16: 0.63 mg via RESPIRATORY_TRACT
  Filled 2018-09-15: qty 3

## 2018-09-15 MED ORDER — SENNOSIDES-DOCUSATE SODIUM 8.6-50 MG PO TABS
1.0000 | ORAL_TABLET | Freq: Every day | ORAL | Status: DC
  Administered 2018-09-15 – 2018-09-22 (×4): 1 via ORAL
  Filled 2018-09-15 (×6): qty 1

## 2018-09-15 MED ORDER — ENOXAPARIN SODIUM 40 MG/0.4ML ~~LOC~~ SOLN
40.0000 mg | SUBCUTANEOUS | Status: DC
  Administered 2018-09-15 – 2018-09-23 (×8): 40 mg via SUBCUTANEOUS
  Filled 2018-09-15 (×9): qty 0.4

## 2018-09-15 MED ORDER — ZOLPIDEM TARTRATE 5 MG PO TABS
5.0000 mg | ORAL_TABLET | Freq: Once | ORAL | Status: AC
  Administered 2018-09-15: 5 mg via ORAL
  Filled 2018-09-15: qty 1

## 2018-09-15 MED ORDER — ACETAMINOPHEN 160 MG/5ML PO SOLN: 1000.0000 mg | Freq: Four times a day (QID) | ORAL | Status: AC

## 2018-09-15 MED ORDER — ONDANSETRON HCL 4 MG/2ML IJ SOLN: 4.0000 mg | Freq: Four times a day (QID) | INTRAMUSCULAR | Status: DC | PRN

## 2018-09-15 MED ORDER — BISACODYL 5 MG PO TBEC
10.0000 mg | DELAYED_RELEASE_TABLET | Freq: Every day | ORAL | Status: DC
  Administered 2018-09-15 – 2018-09-22 (×5): 10 mg via ORAL
  Filled 2018-09-15 (×8): qty 2

## 2018-09-15 NOTE — Progress Notes (Signed)
.   Sean Rose, MRN:  [id number], DOB:  01/04/1956, LOS: 106 ADMISSION DATE:  09/02/2018, CONSULTATION DATE:  2/5 REFERRING MD:  Dr. Valeta Harms, CHIEF COMPLAINT:  Post Op PTX    Brief History   63 year old male with multiple pulmonary nodules admitted for iatrogenic pneumothorax s/p navigational bronchoscopy on 2/5. Chest tube dislodged on 2/8 requiring replacement. Chest tube with persistent air leak.  Significant Hospital Events   2/5 admitted for post-procedural pneumothorax 2/6 Still with significant 1-2 out of 7 airleak.  2/8 Chest tube dislodgement with hemodynamic instability requiring CT replacement 2/9-2/12 Persistent air leak 2/14 - No events overnight. Reports mild right chest pain at chest tube insertion site. He expresses desire to go home if feasible. We discussed that if his air leak remains, he will need surgical intervention first. 2/16 -  no new complaints. Lung resection fixed for Monday 09/14/2018 by Dr Roxan Hockey.  2/17 lung resection planned  Consults:  CTS  Procedures:  Right Chest tube 2/5 >2/8 Right Chest tube 2/8>   Significant Diagnostic Tests:  Navigational Bronchoscopy 2/5 >> Transbronchial needle aspiration specimen D, navigation RUL nodule #2 brushing >> malignant cells present consistent with non-small cell carcinoma, features slightly favor adenocarcinoma.  There is insufficient material present for additional studies   Micro Data:  2/5 Sputum Culture >>neg 2/5 Sputum  AFB >> neg smear >>  2/5 Sputum fungus >>no fungus observed  Antimicrobials:     Interim history/subjective:  S/p surgery. Reports pain well-controlled.  Objective   Blood pressure 110/70, pulse 65, temperature 98.2 F (36.8 C), temperature source Oral, resp. rate 13, height 5\' 8"  (1.727 m), weight 56.6 kg, SpO2 100 %.        Intake/Output Summary (Last 24 hours) at 09/15/2018 1528 Last data filed at 09/15/2018 1200 Gross per 24 hour  Intake 1607.17 ml  Output 1962 ml    Net -354.83 ml   Filed Weights   09/02/18 0644 09/02/18 1519 09/14/18 1957  Weight: 59 kg 58.6 kg 56.6 kg    Physical Exam: General: Well-appearing male laying in bed, no acute distress HENT: [place], AT, OP clear, MMM Eyes: EOMI, no scleral icterus Respiratory: Clear to auscultation bilaterally.  No crackles, wheezing or rales. Right chest tube with air leak present. Cardiovascular: RRR, -M/R/G, no JVD GI: BS+, soft, nontender Extremities:-Edema,-tenderness Neuro: AAO x4, CNII-XII grossly intact Skin: Intact, no rashes or bruising Psych: Normal mood, normal affect   Assessment & Plan:   Iatrogenic right sided pneumothorax s/p right VATS with RUL lobectomy 2/17 P: Post-op care and chest tube per thoracic surgery Dilaudid PCA for pain control  RUL Non-small cell carcinoma, features slightly favor adenocarcinoma s/p RUL lobectomy 2/17 P: Will arrange Pulmonary follow-up prior to discharge Will plan for serial imaging pulmonary nodules per [place] discussion  COPD/Emphysema - PFTs 07/15/18 reviewed and demonstrate mild obstructive defect (FEV1 90%) with significant BD response, air trapping and mildly reduced DLCO. P: Continue home Anoro Ellipta  Hypertension and history of coronary artery disease P: Continue norvasc, lopressor  Hold plavix for now  Anxiety P: Xanax TID PRN  Nausea and vomiting, history of severe GERD Cough P: Continue home PPI  Anti-tussives Reglan PRN   Hyponatremia, asymptomatic P: Trend BMP  Constipation P: Bowel regimen   Best practice:  Diet: Diet Pain/Anxiety/Delirium protocol (if indicated): Dilaudid PCA VAP protocol (if indicated): na DVT prophylaxis: Lovenox GI prophylaxis: Home PPI Glucose control: na Mobility: up in chair Code Status: full Family Communication:  No family at bedside Disposition: Tele   Rodman Pickle, M.D. [facility] Pulmonary/Critical Care Medicine Pager: [phone] After hours pager:  [phone]

## 2018-09-15 NOTE — Op Note (Addendum)
NAME: Sean Rose, Sean W. MEDICAL RECORD [id number] ACCOUNT [id number] DATE OF BIRTH:11/07/1955 FACILITY: [facility] LOCATION: [facility] PHYSICIAN: C. , MD  OPERATIVE REPORT  DATE OF PROCEDURE:  09/14/2018  PREOPERATIVE DIAGNOSIS:  Adenocarcinoma, right upper lobe, likely multifocal.  POSTOPERATIVE DIAGNOSIS:  Adenocarcinoma, right upper lobe, likely multifocal.  PROCEDURES:   Right video-assisted thoracoscopy, Thoracoscopic right upper lobectomy, Mediastinal lymph node dissection, Intercostal nerve blocks - levels 3 through 9.  SURGEON:  Modesto Charon, MD  ASSISTANT:  Enid Cutter, PA-C.  ANESTHESIA:  General.  FINDINGS:  Fissures incomplete.  Multiple tumors palpable within the right upper lobe.  Bronchial margin negative for tumor.  CLINICAL NOTE:  Mr. Ellenburg is a 63 year old gentleman with a history of tobacco abuse who was found to have multiple right upper lobe lung nodules.  He had smaller nodules on the left as well.  The larger and more worrisome appearing nodules were in the right  upper lobe.  A bronchoscopic biopsy was performed.  The patient developed a pneumothorax postprocedure.  A chest tube was placed.  He had an ongoing air leak.  Biopsies were positive for adenocarcinoma from one of the nodules.  Given the heavy  preponderance of the nodules localized to the right upper lobe, he was offered a right upper lobectomy with the understanding there was no guarantee of cure.  The indications, risks, benefits, and alternatives were discussed in detail with the patient.   He understood and accepted the risks and agreed to proceed.  OPERATIVE NOTE:  Mr. Elk was brought to the preoperative holding area on 09/14/2018.  Anesthesia placed a central venous catheter and an arterial blood pressure monitor line.  He was taken to the operating room, anesthetized and intubated with a  double lumen endotracheal tube.  Intravenous antibiotics were administered.   Sequential compression devices were placed on the calves for DVT prophylaxis.  A Foley catheter was placed.  He was placed in a left lateral decubitus position.  Single lung  ventilation of the left lung was initiated.  The pigtail catheter was removed from the right chest, and the chest was prepped and draped in the usual sterile fashion.  A timeout was performed.  A solution containing 20 mL of liposomal bupivacaine, 30 mL of 0.5% bupivacaine and 50 mL of saline was prepared.  This was used for local at the incisions as well as the intercostal nerve blocks.  An area in the mid axillary  line in the 7th intercostal space was injected with the bupivacaine solution.  A 5 mm incision was made and a port was inserted.  The thoracoscope was advanced into the chest.  There was good isolation of the right lung.  A site was selected for the  incision in the fourth interspace anterolaterally.  This area was injected with the bupivacaine solution and a 5 cm working incision was made.  No rib spreading was performed during the procedure.  There were some adhesions of the right  upper lobe to the apex.  These were taken down with a Harmonic scalpel.  Intercostal nerve blocks then were performed from the third to the ninth interspaces.  8 mL of the bupivacaine solution was injected into each of these interspaces in a subpleural  plane.  The minor fissure was totally incomplete.  The major fissure was partially complete, but no dissection was done in the major fissure.  A Harmonic scalpel was used to divide the inferior pulmonary ligament and then the pleural reflection  posteriorly.  Lymph nodes  that were encountered during the dissection were sent separately for permanent pathology.  All the lymph nodes appeared relatively benign grossly.  The pleural reflection was divided at the hilum anteriorly.  The phrenic nerve  was in relatively close proximity to the hilum and care was taken not to use any cautery or the  Harmonic scalpel in the vicinity of the phrenic nerve,   which was preserved.  The upper and middle lobe veins were identified and the minor fissure was begun  using sequential firings of an Echelon 60 mm stapler with gold cartridges in the plane between the veins. The upper lobe pulmonary vein was then dissected out.  The posterior,  right upper lobe vein branch was not dissected out at this point, but  the more superior vein branches were encircled after surrounding nodes were removed.  A separate port incision was made anterior to the first one.  This area was also injected with the bupivacaine solution.  This was used for stapling of the  vessels.  The vascular stapler was used to divide the superior pulmonary vein branches from the right upper lobe with the exception of the posterior branch, which was large and was divided later in the procedure.  Dissection was carried superiorly. The  main pulmonary artery was identified and the common trunk of the anterior and apical branches to the right upper lobe was identified.  This was dissected out.  The surrounding nodes were removed.  These nodes were relatively large, but all appeared  relatively benign.  Once the nodes have been removed from around the common trunk of this vessel, it was divided with the endoscopic vascular stapler.  Level 10 and 4R nodes were removed and sent as separate specimens.  There were multiple nodes along  the bronchus that were dissected out.  The dissection was carried along the main pulmonary artery.  An additional firing of the stapler was performed in the minor fissure.  The posterior branch of the right upper lobe pulmonary vein then was dissected  out, encircled and divided with the vascular stapler.  After removing additional nodes, the bronchus could be seen.  That was dissected free.  The Echelon stapler with a green cartridge was placed across the bronchus and closed.  A test inflation showed  good aeration of the  lower and middle lobes.  The stapler was fired, transecting the bronchus.  The remainder of the minor fissure and the major fissure then was completed with sequential firings of the Echelon stapler posteriorly.  The stapler was  fired, but it was apparent that this was actually on the upper lobe and not the fissure.  A second firing was done adjacent to that.  This area was later secured with a stitch as it was near a nodule but was not the final margin.  The stapling then was  completed, the right upper lobe was placed into an endoscopic retrieval bag, removed and sent for frozen section of the bronchial margin, which returned with no tumor seen.  Inspection was made for hemostasis.  The chest was copiously irrigated with warm  saline.  A test inflation to 30 cm of water revealed no leakage from the bronchial stump.  There was minimal air leakage around the staple lines.  A 28 French chest tube was placed through the anterior port incision and secured with a #1 silk suture.   The middle lobe was tacked to the lower lobe using the Echelon stapler.  The lower and middle  lobes were reinflated.  The scope was withdrawn.  The remaining port incision was closed with a subcuticular suture.  The working incision was closed in  standard fashion in 3 layers.  The chest tube was placed to Pleur-evac suction and on suction.  The patient was back in supine position.   He was extubated in the operating room and taken to the [facility] in good condition.  AN/NUANCE  D:09/14/2018 T:09/15/2018 JOB:[id number]

## 2018-09-15 NOTE — Discharge Summary (Addendum)
Physician Discharge Summary  Patient ID: Sean Rose MRN: [id number] DOB/AGE: 12-22-1955 63 y.o.  Admit date: 09/02/2018 Discharge date: 09/24/2018  Admission Diagnoses:   Patient Active Problem List   Diagnosis Date Noted  . Pneumothorax after biopsy 09/02/2018  . Stage 1 mild COPD by GOLD classification ([place]) 07/30/2018  . Syncope 07/23/2018  . Antiplatelet or antithrombotic long-term use 07/14/2018  . Nodule of upper lobe of right lung 07/14/2018  . Multiple lung nodules on CT 07/14/2018  . Nausea vomiting and diarrhea 10/30/2016  . Hyponatremia 10/30/2016  . Hypocalcemia 10/30/2016  . Hypokalemia 04/14/2015  . Hypomagnesemia 04/14/2015  . Claudication ([place]) 04/13/2015  . Essential hypertension 01/14/2014  . Hyperlipidemia 01/14/2014  . Thrombosed external hemorrhoids 12/07/2013  . Chronic anticoagulation 01/20/2013  . PVD (peripheral vascular disease) ([place]) 01/20/2013  . Coronary atherosclerosis of native coronary artery 01/20/2013  . GERD (gastroesophageal reflux disease) 01/20/2013  . Benign esophageal stricture 01/20/2013  . DENTAL CARIES 07/20/2010   Discharge Diagnoses: Adenocarcinoma right upper lobe T3N1  Patient Active Problem List   Diagnosis Date Noted  . S/P Right VATS with Right Upper Lobectomy, Lymph Node Sampling, Intercostal Nerve Block 09/14/2018  . Pneumothorax after biopsy 09/02/2018  . Stage 1 mild COPD by GOLD classification ([place]) 07/30/2018  . Syncope 07/23/2018  . Antiplatelet or antithrombotic long-term use 07/14/2018  . Nodule of upper lobe of right lung 07/14/2018  . Multiple lung nodules on CT 07/14/2018  . Nausea vomiting and diarrhea 10/30/2016  . Hyponatremia 10/30/2016  . Hypocalcemia 10/30/2016  . Hypokalemia 04/14/2015  . Hypomagnesemia 04/14/2015  . Claudication ([place]) 04/13/2015  . Essential hypertension 01/14/2014  . Hyperlipidemia 01/14/2014  . Thrombosed external hemorrhoids 12/07/2013  . Chronic anticoagulation 01/20/2013   . PVD (peripheral vascular disease) ([place]) 01/20/2013  . Coronary atherosclerosis of native coronary artery 01/20/2013  . GERD (gastroesophageal reflux disease) 01/20/2013  . Benign esophageal stricture 01/20/2013  . DENTAL CARIES 07/20/2010   Discharged Condition: good  History of Present Illness:  Sean Rose is a 63 yo white male with known history of tobacco abuse, COPD, GERD. Recently went to the ED after passing out. He had blood work and a CT chest. CT revealed RUL nodules.  He was referred to Pulmonary care for evaluation.  He was evaluated by Dr. Valeta Harms at which time he denied hemoptysis.  He did notice a weight loss of 10-12 pounds over the past month.  It was felt the patient should undergo bronchoscopy with biopsy of these nodules.  PET CT scan was obtained and showed the lesions to be concerning for infectious inflammatory nodules over malignancy.  He also was cleared by Cardiology to undergo bronchoscopy.  The risks and benefits of the procedure were explained to the patient and he was agreeable to proceed.    Hospital Course:   He presented to [facility] on 09/02/2018.  He was taken to the operating room by Dr. Valeta Harms and underwent Flexible video fiberoptic bronchoscopy with electromagnetic navigation and biopsies.  This procedure was complicated by Right sided pneumothorax.  He required admission and placement of a right sided chest tube placement. Unfortunately patient had a persistent air leak that did not resolve. Pathology from one of the brushings did come back as non-small cell lung cancer.  Cardiothoracic consultation was requested for possible surgical intervention.  He was evaluated by [name] who felt patient would require VATS procedure with resection of RUL.  The risks and benefits of the procedure were explained to the  patient and he was agreeable to proceed.  He was taken to the operating room on 09/14/2018.  He underwent Right VATS with right upper  lobectomy, lymph node sampling, and intercostal nerve block.  He tolerated the procedure without difficulty, was extubated and taken to the recovery room in stable condition.  Post operatively the patient's chest tube exhibited an air leak for several days.  His CXR had remained stable and was free from pneumothorax.  He was transitioned to water seal on 09/17/2018.  Follow up CXR showed development of a small pneumothorax but the air leak slowed significantly.  We will continue the chest tube on waterseal for 2 days.  Small air leak persisted as did a small apical pneumothorax.  The chest tube was returned to -10 cm of water suction and follow-up chest x-ray on 09/22/2018 showed improved expansion of the right lung. The chest tube was then connected to an [place] and follow up CXR's showed stability of the small right apical air space. He had minimal drainage from the tube over the next 24 hours. He did not have an air leak that could be demonstrated at the time of his discharge but as a safety precaution we decided to leave the tube in place for a few more days and remove it in the office early next week. He was given instructions regarding the care of the chest tube prior to discharge.  Consults: pulmonary/intensive care  Significant Diagnostic Studies: nuclear medicine:   Multiple ill-defined spiculated right upper lobe pulmonary nodules show intense hypermetabolic activity for size. Intensity of FDG uptake and clustered distribution of these nodules favor an infectious or inflammatory etiology over malignancy. Suggest continued follow-up by chest CT in 2-3 months.  No hypermetabolic thoracic lymphadenopathy or distant metastatic disease.  Aortic Atherosclerosis (ICD10-I70.0) and Emphysema (ICD10-J43.9). Coronary artery atherosclerosis.  Pathology: FINAL DIAGNOSIS Diagnosis 1. Lung, resection (segmental or lobe), right upper lobe - NON-SMALL CELL CARCINOMA, POORLY  DIFFERENTIATED, SEPARATE TUMOR NODULES WITHIN SAME LOBE - LYMPHOVASCULAR SPACE INVASION AND SPREAD THROUGH AIR SPACES PRESENT - MARGINS UNINVOLVED BY CARCINOMA - NO CARCINOMA IDENTIFIED IN ONE LYMPH NODE - SEE ONCOLOGY TABLE AND COMMENT BELOW 2. Lymph node, biopsy, 7 - NO CARCINOMA IDENTIFIED IN SUBMITTED NODAL TISSUE 3. Lymph node, biopsy, 4R - NO CARCINOMA IDENTIFIED IN SUBMITTED NODAL TISSUE 4. Lymph node, biopsy, 10 - NO LYMPHOID TISSUE IDENTIFIED 5. Lymph node, biopsy, 11 - NO CARCINOMA IDENTIFIED IN SUBMITTED NODAL TISSUE 6. Lymph node, biopsy, 12 - NO CARCINOMA IDENTIFIED IN SUBMITTED NODAL TISSUE 7. Lymph node, biopsy, 12 #2 - NO CARCINOMA IDENTIFIED IN SUBMITTED NODAL TISSUE 8. Lymph node, biopsy, 12 #3 - NO CARCINOMA IDENTIFIED IN SUBMITTED NODAL TISSUE 9. Lymph node, biopsy, 13 - METASTATIC CARCINOMA INVOLVING NODAL TISSUE 10. Lymph node, biopsy, 13 #2 - NO CARCINOMA IDENTIFIED IN SUBMITTED NODAL TISSUE Microscopic Comment 1. LUNG: Procedure: Lobectomy Specimen Laterality: Right 1 of 4 FINAL for Paule, Khan W ([id number]) Microscopic Comment(continued) Tumor Site: Upper lobe of the lung Tumor Size: 2.0 cm (largest focus) Total Tumor Size Inclusive of Invasive and Lepidic Components: N/A Invasive Tumor Size: N/A Tumor Focality: Separate tumor nodules (metastasis) in the same lobe (2) Histologic Type: Invasive adenocarcinoma, solid predominant Visceral Pleura Invasion: G3, poorly differentiated Lymphovascular Invasion: Present Direct Invasion of Adjacent Structures: No adjacent structures present Margins: All margins uninvolved by tumor Treatment Effect: No known presurgical therapy Regional Lymph Nodes: Number of Lymph Nodes Involved: Station 13 nodal tissue involved by carcinoma Number of Lymph  Nodes Examined: Stations 7, 4R, 10, 11, 12, 13 nodal tissue examined Pathologic Stage Classification (pTNM, AJCC 8th Edition): pT3, pN1 Ancillary Studies: Available  upon request Representative Tumor Block: 1C, 1D, 1E Comment(s): The largest tumor focus has focal well-differentiated area which is positive for TTF-1 but negative for cytokeratin 5/6 and p63. The areas of poor differentiation are negative for TTF-1. The other foci of carcinoma show poorly differentiated carcinoma with histomorphology compatible with intrapulmonary metastases from the main lesion. The presence of spread through air spaces (STAS) supports this interpretation. Drs. Doreene Eland reviewed the case and agree with the above diagnosis. (v4.0.0.3) DAWN BUTLER, MD  Treatments: surgery:    Right video-assisted thoracoscopy, thoracoscopic right upper lobectomy, mediastinal lymph node dissection, intercostal nerve blocks at levels 3 through 9.  Discharge Exam: Blood pressure 132/82, pulse 60, temperature 98 F (36.7 C), temperature source Oral, resp. rate 14, height 5\' 8"  (1.727 m), weight 56.6 kg, SpO2 100 %.  General appearance:alert, cooperative and no distress Heart:regular rate and rhythm Lungs:clear to auscultation bilaterally.CT is connected to a mini atrium express and is on waterseal.  He is only had about 25 mL's of serous fluid drained into the collection chamber since it was placed.  The device was tilted horizontally to test for an air leak.  And no air leak is seen during a cough.  CXR showsright lungexpansion similar to yesterday. Abdomen:soft, non-tender; bowel sounds normal; no masses, no organomegaly. Extremities:extremities normal, atraumatic, no cyanosis or edema Wound:clean and dry   Disposition:Home  Discharge Medications:   Allergies as of 09/24/2018      Reactions   Oxycodone-acetaminophen Nausea Only   Tylox [oxycodone-acetaminophen] Nausea Only   Tyloxapol Nausea Only      Medication List    STOP taking these medications   nitroGLYCERIN 0.4 MG SL tablet Commonly known as:  NITROSTAT     TAKE these medications    amLODipine 5 MG tablet Commonly known as:  NORVASC Take 1 tablet (5 mg total) by mouth daily. Start taking on:  September 25, 2018 What changed:  See the new instructions.   aspirin EC 81 MG tablet Take 81 mg by mouth daily.   clopidogrel 75 MG tablet Commonly known as:  PLAVIX TAKE 1 TABLET BY MOUTH EVERY DAY   diphenhydrAMINE 25 mg capsule Commonly known as:  BENADRYL Take 25 mg by mouth daily as needed for allergies.   ibuprofen 200 MG tablet Commonly known as:  ADVIL,MOTRIN Take 400 mg by mouth every 6 (six) hours as needed for headache or moderate pain.   Magnesium 250 MG Tabs Take 250 mg by mouth daily.   metoprolol succinate 100 MG 24 hr tablet Commonly known as:  TOPROL-XL TAKE 1 TABLET BY MOUTH EVERY DAY   multivitamin with minerals Tabs tablet Take 1 tablet by mouth daily.   nicotine 14 mg/24hr patch Commonly known as:  NICODERM CQ - dosed in mg/24 hours Place 1 patch (14 mg total) onto the skin daily. Start taking on:  September 25, 2018   oxyCODONE 5 MG immediate release tablet Commonly known as:  Oxy IR/ROXICODONE Take 1 tablet (5 mg total) by mouth every 4 (four) hours as needed for severe pain.   umeclidinium-vilanterol 62.5-25 MCG/INH Aepb Commonly known as:  ANORO ELLIPTA Inhale 1 puff into the lungs daily.      Follow-up Information    Icard, Leory Plowman L, DO Follow up on 10/02/2018.   Specialty:  Pulmonary Disease Why:  0930 appointment with Dr.  Icard Contact information: [address] [place] [address] [phone]        Melrose Nakayama, MD Follow up on 09/29/2018.   Specialty:  Cardiothoracic Surgery Why:  Our office will contact you with details for your appointment on Tuesday, 09/28/18.  Contact information: [address] [place] [address] [phone]           Signed: [name] 09/24/2018, 10:49 AM

## 2018-09-15 NOTE — Progress Notes (Addendum)
      [address]       ,[place] [address]             [phone]      1 Day Post-Op Procedure(s) (LRB): RIGHT VIDEO ASSISTED THORACOSCOPY (VATS)/ RIGHT UPPER LOBECTOMY (Right)   Subjective:  Patient doing better than he expected.  He has some mild pain which is controlled with PCA.  Biggest issue is the neck line is causing discomfort and he is having trouble getting comfortable due to incision and chest tube along his back.  Denies N/V  Objective: Vital signs in last 24 hours: Temp:  [97.3 F (36.3 C)-98.2 F (36.8 C)] 98.2 F (36.8 C) (02/18 0423) Pulse Rate:  [58-72] 65 (02/18 0423) Cardiac Rhythm: Normal sinus rhythm (02/18 0700) Resp:  [7-19] 17 (02/18 0438) BP: (87-132)/(56-88) 114/70 (02/18 0423) SpO2:  [94 %-100 %] 100 % (02/18 0806) Weight:  [56.6 kg] 56.6 kg (02/17 1957)  Intake/Output from previous day: 02/17 0701 - 02/18 0700 In: 1400 [I.V.:1400] Out: 2137 [Urine:1775; Blood:200; Chest Tube:162]  General appearance: alert, cooperative and no distress Heart: regular rate and rhythm Lungs: clear to auscultation bilaterally Abdomen: soft, non-tender; bowel sounds normal; no masses,  no organomegaly Extremities: extremities normal, atraumatic, no cyanosis or edema Wound: clean and dry  Lab Results: Recent Labs    09/14/18 0319 09/14/18 1547 09/15/18 0228  WBC 6.8  --  17.1*  HGB 13.1 9.5* 11.7*  HCT 36.6* 28.0* 33.0*  PLT 375  --  343   BMET:  Recent Labs    09/14/18 0319 09/14/18 1547 09/15/18 0228  NA 129* 136 124*  K 3.6 2.9* 3.6  CL 95*  --  93*  CO2 24  --  22  GLUCOSE 91  --  133*  BUN <5*  --  <5*  CREATININE 0.53*  --  0.56*  CALCIUM 8.7*  --  8.1*    PT/INR: No results for input(s): LABPROT, INR in the last 72 hours. ABG    Component Value Date/Time   PHART 7.474 (H) 09/15/2018 0750   HCO3 25.0 09/15/2018 0750   TCO2 20 (L) 09/14/2018 1547   ACIDBASEDEF 6.0 (H) 09/14/2018 1547   O2SAT 94.1 09/15/2018 0750     CBG (last 3)  No results for input(s): GLUCAP in the last 72 hours.  Assessment/Plan: S/P Procedure(s) (LRB): RIGHT VIDEO ASSISTED THORACOSCOPY (VATS)/ RIGHT UPPER LOBECTOMY (Right)  1. Chest tube- + air leak, CT output okay, leave on suction today 2. CV- hemodynamically stable 3. Pulm- + sub q air along right chest, CXR with tiny apical space, no pneumothorax, continue pulmonary toilet 4. D/C Arterial line 5. Lovenox for DVT prophylaxis 6. IV fluids to KVO 7. Dispo- patient stable, CT with air leak will leave on suction today, CXR is free from pneumothorax, repeat CXR in AM   LOS: 13 days    Ellwood Handler 09/15/2018 Patient seen and examined, agree with above Has a small air leak- keep tube to suction today Dc A line, mobilize  [employer]. Roxan Hockey, MD [facility] [phone]

## 2018-09-16 ENCOUNTER — Inpatient Hospital Stay (HOSPITAL_COMMUNITY): Payer: BLUE CROSS/BLUE SHIELD

## 2018-09-16 LAB — CBC
HCT: 33.5 % — ABNORMAL LOW (ref 39.0–52.0)
Hemoglobin: 11.7 g/dL — ABNORMAL LOW (ref 13.0–17.0)
MCH: 35.3 pg — AB (ref 26.0–34.0)
MCHC: 34.9 g/dL (ref 30.0–36.0)
MCV: 101.2 fL — ABNORMAL HIGH (ref 80.0–100.0)
Platelets: 383 10*3/uL (ref 150–400)
RBC: 3.31 MIL/uL — ABNORMAL LOW (ref 4.22–5.81)
RDW: 12.4 % (ref 11.5–15.5)
WBC: 12.4 10*3/uL — ABNORMAL HIGH (ref 4.0–10.5)
nRBC: 0 % (ref 0.0–0.2)

## 2018-09-16 LAB — PHOSPHORUS: Phosphorus: 3.4 mg/dL (ref 2.5–4.6)

## 2018-09-16 MED ORDER — METOCLOPRAMIDE HCL 5 MG/ML IJ SOLN
10.0000 mg | Freq: Four times a day (QID) | INTRAMUSCULAR | Status: AC
  Administered 2018-09-16 – 2018-09-17 (×4): 10 mg via INTRAVENOUS
  Filled 2018-09-16 (×4): qty 2

## 2018-09-16 MED ORDER — DIPHENHYDRAMINE HCL 12.5 MG/5ML PO ELIX
12.5000 mg | ORAL_SOLUTION | Freq: Four times a day (QID) | ORAL | Status: DC | PRN
  Filled 2018-09-16: qty 5

## 2018-09-16 MED ORDER — DIPHENHYDRAMINE HCL 50 MG/ML IJ SOLN: 25.0000 mg | Freq: Four times a day (QID) | INTRAMUSCULAR | Status: DC | PRN

## 2018-09-16 MED ORDER — LACTULOSE 10 GM/15ML PO SOLN
20.0000 g | Freq: Once | ORAL | Status: AC
  Administered 2018-09-16: 20 g via ORAL
  Filled 2018-09-16: qty 30

## 2018-09-16 NOTE — Progress Notes (Addendum)
      [address]       [place] [address]             [phone]      2 Days Post-Op Procedure(s) (LRB): RIGHT VIDEO ASSISTED THORACOSCOPY (VATS)/ RIGHT UPPER LOBECTOMY (Right)   Subjective:  Patient with multiple complaints.  States he felt terrible yesterday as he did not sleep much night before.  Took an [place] and was able to sleep last night.  However, he developed nightmares and hallucinations at times.  Patient wants to be fully independent and he was instructed on importance of getting assistance from nursing should be need it.  + ambulation  Last BM 2/16  Objective: Vital signs in last 24 hours: Temp:  [97.5 F (36.4 C)-98.6 F (37 C)] 98.2 F (36.8 C) (02/19 0520) Pulse Rate:  [55-63] 57 (02/19 0520) Cardiac Rhythm: Normal sinus rhythm (02/18 2030) Resp:  [11-22] 12 (02/19 0520) BP: (105-130)/(67-86) 130/78 (02/19 0520) SpO2:  [93 %-100 %] 96 % (02/19 0735)  Intake/Output from previous day: 02/18 0701 - 02/19 0700 In: 2193.8 [P.O.:1810; I.V.:95.2; IV Piggyback:288.5] Out: 1280 [Urine:1050; Chest Tube:230]  General appearance: alert, cooperative and no distress Heart: regular rate and rhythm Lungs: clear to auscultation bilaterally and + whistling, likely due to air leak Abdomen: soft, non-tender; bowel sounds normal; no masses,  no organomegaly Extremities: extremities normal, atraumatic, no cyanosis or edema Wound: clean and dry  Lab Results: Recent Labs    09/15/18 0228 09/16/18 0540  WBC 17.1* 12.4*  HGB 11.7* 11.7*  HCT 33.0* 33.5*  PLT 343 383   BMET:  Recent Labs    09/14/18 0319 09/14/18 1547 09/15/18 0228  NA 129* 136 124*  K 3.6 2.9* 3.6  CL 95*  --  93*  CO2 24  --  22  GLUCOSE 91  --  133*  BUN <5*  --  <5*  CREATININE 0.53*  --  0.56*  CALCIUM 8.7*  --  8.1*    PT/INR: No results for input(s): LABPROT, INR in the last 72 hours. ABG    Component Value Date/Time   PHART 7.474 (H) 09/15/2018 0750   HCO3 25.0  09/15/2018 0750   TCO2 20 (L) 09/14/2018 1547   ACIDBASEDEF 6.0 (H) 09/14/2018 1547   O2SAT 94.1 09/15/2018 0750   CBG (last 3)  No results for input(s): GLUCAP in the last 72 hours.  Assessment/Plan: S/P Procedure(s) (LRB): RIGHT VIDEO ASSISTED THORACOSCOPY (VATS)/ RIGHT UPPER LOBECTOMY (Right)  1. Chest tube- + air leak, will leave chest tube on suction 2. CV- NSR, BP okay, on Norvasc 3. Pulm- CXR is poor quality, there is mild increase in sub q air, patient is rotated, stable minimal apical space, elevation of diaphragm due to collection of stool, continue IS 4. D/C Central Line 5. D/C Foley catheter 6. Dispo- patient stable, continued air leak, leave chest tube on suction, no ambien this evening for sleep due to hallucinations and nightmares, repeat CXR in AM   LOS: 14 days    Erin Barrett 09/16/2018 Some nightmares last night Very anxious about path CXR shows extensive bowel gas c/w ileus- minimally symptomatic- will give lactulose and Reglan No pain this AM Path still pending  Remo Lipps C. Roxan Hockey, MD [facility] [phone]

## 2018-09-16 NOTE — Plan of Care (Signed)

## 2018-09-16 NOTE — Progress Notes (Signed)
Wasted Dilaudid PCA syringe 15 mL in stericycle with Aileen Pilot, RN.

## 2018-09-17 ENCOUNTER — Inpatient Hospital Stay (HOSPITAL_COMMUNITY): Payer: BLUE CROSS/BLUE SHIELD

## 2018-09-17 LAB — PHOSPHORUS: Phosphorus: 3.2 mg/dL (ref 2.5–4.6)

## 2018-09-17 NOTE — Progress Notes (Addendum)
3 Days Post-Op Procedure(s) (LRB): RIGHT VIDEO ASSISTED THORACOSCOPY (VATS)/ RIGHT UPPER LOBECTOMY (Right) Subjective: Resting in bed.  No new problems overnight.  Denies shortness of breath.  Pain control is adequate.  He had several loose stools yesterday.   Objective: Vital signs in last 24 hours: Temp:  [98 F (36.7 C)-98.9 F (37.2 C)] 98.1 F (36.7 C) (02/20 0750) Pulse Rate:  [58-67] 67 (02/20 0750) Cardiac Rhythm: Sinus bradycardia;Normal sinus rhythm (02/20 0400) Resp:  [12-23] 23 (02/20 0750) BP: (102-134)/(70-95) 124/82 (02/20 0750) SpO2:  [94 %-99 %] 94 % (02/20 0720)     Intake/Output from previous day: 02/19 0701 - 02/20 0700 In: 600 [P.O.:600] Out: 1325 [Urine:975; Chest Tube:350] Intake/Output this shift: No intake/output data recorded.  General appearance: alert, cooperative and no distress Heart: regular rate and rhythm Lungs: Breath sounds are clear on the left.  Continues to have a constant, high-pitched sound of air movement on the right side.Sean Rose shows continuous air leak.  There is no evidence of air leak around chest tube insertion site or along the course of the drainage tube to the Pleur-evac.  Chest x-ray shows good expansion right lung with an expected small apical airspace. Wound: The right chest incisions are well approximated and covered with Dermabond.  They are dry and healing with no sign of complication  Lab Results: Recent Labs    09/15/18 0228 09/16/18 0540  WBC 17.1* 12.4*  HGB 11.7* 11.7*  HCT 33.0* 33.5*  PLT 343 383   BMET:  Recent Labs    09/14/18 1547 09/15/18 0228  NA 136 124*  K 2.9* 3.6  CL  --  93*  CO2  --  22  GLUCOSE  --  133*  BUN  --  <5*  CREATININE  --  0.56*  CALCIUM  --  8.1*    PT/INR: No results for input(s): LABPROT, INR in the last 72 hours. ABG    Component Value Date/Time   PHART 7.474 (H) 09/15/2018 0750   HCO3 25.0 09/15/2018 0750   TCO2 20 (L) 09/14/2018 1547   ACIDBASEDEF 6.0 (H)  09/14/2018 1547   O2SAT 94.1 09/15/2018 0750   CBG (last 3)  No results for input(s): GLUCAP in the last 72 hours.  Assessment/Plan: S/P Procedure(s) (LRB): RIGHT VIDEO ASSISTED THORACOSCOPY (VATS)/ RIGHT UPPER LOBECTOMY (Right)   -Postop day 3 right video-assisted thoracoscopy with right upper lobectomy.  Continuous air leak.  Leave chest tube on suction today.  Path is pending. -DVT prophylaxis-continue enoxaparin -History of hypertension.  BP stable continue Norvasc    LOS: 15 days    Sean Rose [phone] 09/17/2018 Patient seen and examined, agree with above except will try on water seal today  Remo Lipps C. Roxan Hockey, MD [facility] [phone]

## 2018-09-17 NOTE — Progress Notes (Signed)
Offered to ambulate in the hallway. Pt politely declines at this time. Has been ambulating independently to and from the restroom in the room without difficulty.  Fritz Pickerel, RN

## 2018-09-17 NOTE — Care Management Note (Signed)
Case Management Note  Patient Details  Name: Sean Rose MRN: [id number] Date of Birth: Oct 04, 1955  Subjective/Objective:        Pt s/p Lobectomy/VATS            Action/Plan:   PTA from home with wife.     Expected Discharge Date:                  Expected Discharge Plan:  Home/Self Care  In-House Referral:     Discharge planning Services  CM Consult  Post Acute Care Choice:    Choice offered to:     DME Arranged:    DME Agency:     HH Arranged:    HH Agency:     Status of Service:  In process, will continue to follow  If discussed at Long Length of Stay Meetings, dates discussed:    Additional Comments: 09/17/2018 Pt remains with CT to suction and PCA.   Maryclare Labrador, RN 09/17/2018, 9:47 AM

## 2018-09-18 ENCOUNTER — Inpatient Hospital Stay (HOSPITAL_COMMUNITY): Payer: BLUE CROSS/BLUE SHIELD

## 2018-09-18 NOTE — Progress Notes (Signed)
[facility]  Chart reviewed, patient still has small pneumothorax Appreciate care per [facility] Pul[facility] available as needed through weekend  Roselie Awkward, MD [place] [facility] Pager: [phone] Cell: [phone] If no response, call [phone]

## 2018-09-18 NOTE — Progress Notes (Addendum)
4 Days Post-Op Procedure(s) (LRB): RIGHT VIDEO ASSISTED THORACOSCOPY (VATS)/ RIGHT UPPER LOBECTOMY (Right) Subjective: Resting in bed.  Slept well last night.  No new problems or concerns.  Remains on room air.  Chest tube has been on waterseal for the past 24 hours.  Objective: Vital signs in last 24 hours: Temp:  [98 F (36.7 C)-99.2 F (37.3 C)] 98 F (36.7 C) (02/21 0500) Pulse Rate:  [62-67] 62 (02/20 2354) Cardiac Rhythm: Sinus bradycardia (02/21 0500) Resp:  [16-23] 16 (02/21 0500) BP: (124-150)/(77-84) 130/79 (02/21 0500) SpO2:  [98 %] 98 % (02/21 0716)   Intake/Output from previous day: 02/20 0701 - 02/21 0700 In: 360 [P.O.:360] Out: 40 [Chest Tube:40] Intake/Output this shift: No intake/output data recorded.  General appearance: alert, cooperative and no distress  Heart: regular rate and rhythm  Lungs: Breath sounds are clear bilaterally.  No evidence of subcu air.  No air leak is seen with normal spontaneous respiration.  He does have an air leak with cough and deep breathing.  Chest x-ray was reviewed.  There is a new 20% apical pneumothorax.  The chest tube is in good position.  Wound: The right chest incisions are well approximated and covered with Dermabond.  They are dry and healing with no sign of complication  Lab Results: Recent Labs    09/16/18 0540  WBC 12.4*  HGB 11.7*  HCT 33.5*  PLT 383   BMET: No results for input(s): NA, K, CL, CO2, GLUCOSE, BUN, CREATININE, CALCIUM in the last 72 hours.  PT/INR: No results for input(s): LABPROT, INR in the last 72 hours. ABG    Component Value Date/Time   PHART 7.474 (H) 09/15/2018 0750   HCO3 25.0 09/15/2018 0750   TCO2 20 (L) 09/14/2018 1547   ACIDBASEDEF 6.0 (H) 09/14/2018 1547   O2SAT 94.1 09/15/2018 0750   CBG (last 3)  No results for input(s): GLUCAP in the last 72 hours.  Assessment/Plan: S/P Procedure(s) (LRB): RIGHT VIDEO ASSISTED THORACOSCOPY (VATS)/ RIGHT UPPER LOBECTOMY  (Right)  -Postop day 4 right video-assisted thoracoscopy with right upper lobectomy.    Chest tube on waterseal past 24 hours, new 20% right apical pneumothorax on chest x-ray this morning.  Will likely return chest tube to low suction today. Path is pending. -DVT prophylaxis-continue enoxaparin -History of hypertension.  BP stable continue Norvasc   LOS: 16 days    [name], PA-C [phone] 09/18/2018 patient seen and examined, agree with above Path still pending Still has an air leak- tolerating CT to water seal. Cxr shows lung down slightly - will keep on water seal for now. Low threshold to return to suction if any issues  Remo Lipps C. Roxan Hockey, MD [facility] [phone]

## 2018-09-18 NOTE — Care Management Note (Signed)
Case Management Note  Patient Details  Name: Sean Rose MRN: [id number] Date of Birth: [birth year]  Subjective/Objective:        Pt s/p Lobectomy/VATS            Action/Plan:   PTA from home with wife.     Expected Discharge Date:                  Expected Discharge Plan:  Home/Self Care  In-House Referral:     Discharge planning Services  CM Consult  Post Acute Care Choice:    Choice offered to:     DME Arranged:    DME Agency:     HH Arranged:    HH Agency:     Status of Service:  In process, will continue to follow  If discussed at Long Length of Stay Meetings, dates discussed:    Additional Comments: 09/18/2018  CT remains however to water seal now.  Pt confirms he has PCP and denied barriers with paying for medications.  Pt continues to independently ambulate while hospitalized.  CM will continue to follow for discharge needs  09/17/2018 Pt remains with CT to suction and PCA.   Maryclare Labrador, RN 09/18/2018, 11:30 AM

## 2018-09-19 ENCOUNTER — Inpatient Hospital Stay (HOSPITAL_COMMUNITY): Payer: BLUE CROSS/BLUE SHIELD

## 2018-09-19 MED ORDER — KETOROLAC TROMETHAMINE 15 MG/ML IJ SOLN
15.0000 mg | Freq: Four times a day (QID) | INTRAMUSCULAR | Status: DC | PRN
  Administered 2018-09-19: 15 mg via INTRAVENOUS
  Filled 2018-09-19: qty 1

## 2018-09-19 NOTE — Progress Notes (Signed)
PCXR shows no significant changes.  PA again notified because patient is still c/o 10/10 pain.  New orders received for IV Toradol.  Patient also to be given PO pain meds per PRN orders.

## 2018-09-19 NOTE — Progress Notes (Signed)
Pt calls with c/o significant 10/10 pain to right lateral chest wall "around the chest tube".  Patient states its a sharp, burning pain.  RN gently palpates skin above the insertion site and patient winces w/pain.  CT dressing removed to check for movement or dislodgement of tube - none noted.  Vaseline gauze placed around site and redressed w/dry gauze, secured w/Medipore tape. VSS.  Sats 100% on RA.  No changes in LS.  Nicholes Rough, PA for [facility]TS, notified and CXR ordered to follow up.

## 2018-09-20 ENCOUNTER — Inpatient Hospital Stay (HOSPITAL_COMMUNITY): Payer: BLUE CROSS/BLUE SHIELD

## 2018-09-20 MED ORDER — LUNG SURGERY BOOK
Freq: Once | Status: DC
  Filled 2018-09-20: qty 1

## 2018-09-20 NOTE — Plan of Care (Signed)
  Problem: Health Behavior/Discharge Planning: Goal: Ability to manage health-related needs will improve Outcome: Progressing   Problem: Clinical Measurements: Goal: Cardiovascular complication will be avoided Outcome: Progressing   Problem: Nutrition: Goal: Adequate nutrition will be maintained Outcome: Progressing   Problem: Health Behavior/Discharge Planning: Goal: Ability to manage health-related needs will improve Outcome: Progressing   Problem: Clinical Measurements: Goal: Cardiovascular complication will be avoided Outcome: Progressing   Problem: Nutrition: Goal: Adequate nutrition will be maintained Outcome: Progressing   Problem: Coping: Goal: Level of anxiety will decrease Outcome: Progressing   Problem: Pain Managment: Goal: General experience of comfort will improve Outcome: Progressing   Problem: Skin Integrity: Goal: Risk for impaired skin integrity will decrease Outcome: Progressing   Problem: Clinical Measurements: Goal: Postoperative complications will be avoided or minimized Outcome: Progressing   Problem: Respiratory: Goal: Respiratory status will improve Outcome: Progressing   Problem: Pain Management: Goal: Pain level will decrease Outcome: Progressing   Problem: Skin Integrity: Goal: Wound healing without signs and symptoms infection will improve Outcome: Progressing

## 2018-09-20 NOTE — Progress Notes (Addendum)
      [address]       ,[place] [address]             [phone]      6 Days Post-Op Procedure(s) (LRB): RIGHT VIDEO ASSISTED THORACOSCOPY (VATS)/ RIGHT UPPER LOBECTOMY (Right) Subjective: Sean Rose about leaving the unit and keeping staff informed. He states he will bring his phone and have someone with him.   Objective: Vital signs in last 24 hours: Temp:  [97.5 F (36.4 C)-98.6 F (37 C)] 98.6 F (37 C) (02/23 0720) Pulse Rate:  [55-65] 57 (02/23 0300) Cardiac Rhythm: Sinus bradycardia (02/23 0800) Resp:  [12-18] 17 (02/23 0600) BP: (111-132)/(64-79) 112/74 (02/23 0720) SpO2:  [96 %-100 %] 99 % (02/23 0720)     Intake/Output from previous day: 02/22 0701 - 02/23 0700 In: 480 [P.O.:480] Out: 0  Intake/Output this shift: Total I/O In: 240 [P.O.:240] Out: 0   General appearance: alert, cooperative and no distress Heart: regular rate and rhythm, S1, S2 normal, no murmur, click, rub or gallop Lungs: clear to auscultation bilaterally Abdomen: soft, non-tender; bowel sounds normal; no masses,  no organomegaly Extremities: extremities normal, atraumatic, no cyanosis or edema Wound: clean and dry  Lab Results: No results for input(s): WBC, HGB, HCT, PLT in the last 72 hours. BMET: No results for input(s): NA, K, CL, CO2, GLUCOSE, BUN, CREATININE, CALCIUM in the last 72 hours.  PT/INR: No results for input(s): LABPROT, INR in the last 72 hours. ABG    Component Value Date/Time   PHART 7.474 (H) 09/15/2018 0750   HCO3 25.0 09/15/2018 0750   TCO2 20 (L) 09/14/2018 1547   ACIDBASEDEF 6.0 (H) 09/14/2018 1547   O2SAT 94.1 09/15/2018 0750   CBG (last 3)  No results for input(s): GLUCAP in the last 72 hours.  Assessment/Plan: S/P Procedure(s) (LRB): RIGHT VIDEO ASSISTED THORACOSCOPY (VATS)/ RIGHT UPPER LOBECTOMY (Right)  1. CV-NSR, BP well controlled 2. Tolerating room air with good oxygen saturation. Does have a small-moderate pneumothorax on CXR  but has been stable-s/p right upper lobectomy. 4+ air-leak when coughing but no air leak at baseline.  3. Renal-creatinine 0.56, no new labs  Plan: Discussed the risks associated with leaving the unit. He knows and accepts these risks. He wants to walk as much as possible and he feels stir crazy in the room. I reinforced the need for [place] to monitor him closely since he has a pneumo and an air leak. I reviewed the chest xray with him at the bedside. Continue chest tube to water seal.    LOS: 18 days    Sean Rose 09/20/2018   Chart reviewed, patient examined, agree with above. CXR looks stable over the past several days with small right apical ptx.  He does not have bubbling with normal breathing but does with cough. May be coughing out the accumulated ptx. Would keep to water seal.

## 2018-09-20 NOTE — Progress Notes (Signed)
      [address]       [place] [address]             [phone]      Paged this morning regarding Mr. Los wanting to leave the unit and go down to [place] with his wife then return to the unit. He has been on water seal with a chest tube in place. Recent CXR shows stable pneumothorax. He was walked outside yesterday with Tammy, RN and tolerated well. He understands the risks of leaving the unit and plans to take his cell phone with him.    Nicholes Rough, PA-C

## 2018-09-21 ENCOUNTER — Inpatient Hospital Stay (HOSPITAL_COMMUNITY): Payer: BLUE CROSS/BLUE SHIELD

## 2018-09-21 MED ORDER — BENZONATATE 100 MG PO CAPS
200.0000 mg | ORAL_CAPSULE | Freq: Three times a day (TID) | ORAL | Status: DC | PRN
  Administered 2018-09-21 – 2018-09-22 (×4): 200 mg via ORAL
  Filled 2018-09-21 (×4): qty 2

## 2018-09-21 NOTE — Plan of Care (Signed)
  Problem: Health Behavior/Discharge Planning: Goal: Ability to manage health-related needs will improve Outcome: Progressing   Problem: Clinical Measurements: Goal: Cardiovascular complication will be avoided Outcome: Progressing   Problem: Nutrition: Goal: Adequate nutrition will be maintained Outcome: Progressing   Problem: Coping: Goal: Level of anxiety will decrease Outcome: Progressing   Problem: Pain Managment: Goal: General experience of comfort will improve Outcome: Progressing   Problem: Skin Integrity: Goal: Risk for impaired skin integrity will decrease Outcome: Progressing   Problem: Respiratory: Goal: Respiratory status will improve Outcome: Progressing   Problem: Pain Management: Goal: Pain level will decrease Outcome: Progressing   Problem: Skin Integrity: Goal: Wound healing without signs and symptoms infection will improve Outcome: Progressing

## 2018-09-21 NOTE — Progress Notes (Addendum)
7 Days Post-Op Procedure(s) (LRB): RIGHT VIDEO ASSISTED THORACOSCOPY (VATS)/ RIGHT UPPER LOBECTOMY (Right) Subjective: Awake and alert, more comfortable. No new problems.  Objective: Vital signs in last 24 hours: Temp:  [97.9 F (36.6 C)-98.6 F (37 C)] 98.6 F (37 C) (02/24 0740) Pulse Rate:  [60-70] 70 (02/24 0740) Cardiac Rhythm: Normal sinus rhythm (02/23 1957) Resp:  [11-27] 27 (02/24 0740) BP: (104-144)/(73-94) 104/73 (02/24 0740) SpO2:  [98 %-99 %] 99 % (02/24 0740)    Intake/Output from previous day: 02/23 0701 - 02/24 0700 In: 240 [P.O.:240] Out: 0  Intake/Output this shift: No intake/output data recorded.  General appearance: alert, cooperative and no distress Heart: regular rate and rhythm Lungs: clear to auscultation bilaterally. Air leak only with cough. CXR shows small right PTX is stable with CT on water seal. Abdomen: soft, non-tender; bowel sounds normal; no masses,  no organomegaly.   Extremities: extremities normal, atraumatic, no cyanosis or edema Wound: clean and dry  Lab Results: No results for input(s): WBC, HGB, HCT, PLT in the last 72 hours. BMET: No results for input(s): NA, K, CL, CO2, GLUCOSE, BUN, CREATININE, CALCIUM in the last 72 hours.  PT/INR: No results for input(s): LABPROT, INR in the last 72 hours. ABG    Component Value Date/Time   PHART 7.474 (H) 09/15/2018 0750   HCO3 25.0 09/15/2018 0750   TCO2 20 (L) 09/14/2018 1547   ACIDBASEDEF 6.0 (H) 09/14/2018 1547   O2SAT 94.1 09/15/2018 0750   CBG (last 3)  No results for input(s): GLUCAP in the last 72 hours.  Assessment/Plan: S/P Procedure(s) (LRB): RIGHT VIDEO ASSISTED THORACOSCOPY (VATS)/ RIGHT UPPER LOBECTOMY (Right)  -Postop day 6 right video-assisted thoracoscopy with right upper lobectomy.   Chest tube on waterseal past 3 days, has stable 20% right apical pneumothorax on chest x-ray.  Air leak has slowed.  Will return chest tube to-10cmH2O suction today. CXR in AM. -DVT  prophylaxis-continue enoxaparin -History of hypertension. BP stable continue Norvasc, metoprolol.   LOS: 19 days    [name], [place] [phone] 09/21/2018 Patient seen and examined, agree with above  Remo Lipps C. Roxan Hockey, MD [facility] [phone]

## 2018-09-22 ENCOUNTER — Inpatient Hospital Stay (HOSPITAL_COMMUNITY): Payer: BLUE CROSS/BLUE SHIELD

## 2018-09-22 NOTE — Progress Notes (Signed)
Patient c/o lake of sleep during at night time. Pt's v/s is stable. PA Tacy Dura notified this matter and suggested to check V/S before go to bed then don't check until morning time, but if pt's condition change, will check V/S during the night. Night nurse made aware of it. HS [employer]

## 2018-09-22 NOTE — Progress Notes (Addendum)
8 Days Post-Op Procedure(s) (LRB): RIGHT VIDEO ASSISTED THORACOSCOPY (VATS)/ RIGHT UPPER LOBECTOMY (Right) Subjective: Rested better last night.  Less coughing after using Tessalon.  Objective: Vital signs in last 24 hours: Temp:  [97.9 F (36.6 C)-98.6 F (37 C)] 97.9 F (36.6 C) (02/25 0402) Pulse Rate:  [60-70] 60 (02/25 0402) Cardiac Rhythm: Normal sinus rhythm (02/25 0700) Resp:  [11-27] 14 (02/25 0402) BP: (112-133)/(73-82) 128/82 (02/25 0402) SpO2:  [94 %-99 %] 94 % (02/25 0748)  General appearance:alert, cooperative and no distress Heart:regular rate and rhythm Lungs:clear to auscultation bilaterally. Air leak continues to slow. CXR shows right lung has expanded further since placing CT back to -10cmH2O suction yesterday. Abdomen:soft, non-tender; bowel sounds normal; no masses, no organomegaly.   Extremities:extremities normal, atraumatic, no cyanosis or edema Wound:clean and dry    Intake/Output from previous day: 02/24 0701 - 02/25 0700 In: 240 [P.O.:240] Out: 70 [Chest Tube:70] Intake/Output this shift: No intake/output data recorded.    Lab Results: No results for input(s): WBC, HGB, HCT, PLT in the last 72 hours. BMET: No results for input(s): NA, K, CL, CO2, GLUCOSE, BUN, CREATININE, CALCIUM in the last 72 hours.  PT/INR: No results for input(s): LABPROT, INR in the last 72 hours. ABG    Component Value Date/Time   PHART 7.474 (H) 09/15/2018 0750   HCO3 25.0 09/15/2018 0750   TCO2 20 (L) 09/14/2018 1547   ACIDBASEDEF 6.0 (H) 09/14/2018 1547   O2SAT 94.1 09/15/2018 0750   CBG (last 3)  No results for input(s): GLUCAP in the last 72 hours.  Assessment/Plan: S/P Procedure(s) (LRB): RIGHT VIDEO ASSISTED THORACOSCOPY (VATS)/ RIGHT UPPER LOBECTOMY (Right)  -Postop day7right video-assisted thoracoscopy with right upper lobectomy. Chest tube on low suction past 24 hours after being on water seal for 3 days.  Air leak has slowed. Continue low  suction for now.  CXR in AM. -DVT prophylaxis-continue enoxaparin -History of hypertension. BP stable continue Norvasc, metoprolol.   LOS: 20 days    Sean Rose, [place] [phone] 09/22/2018 Patient seen and examined, agree with above Leak is very small at this point. Will leave on 10 cm suction  Sean Rose C. Sean Hockey, MD [facility] [phone]

## 2018-09-22 NOTE — Plan of Care (Signed)
  Problem: Health Behavior/Discharge Planning: Goal: Ability to manage health-related needs will improve Outcome: Progressing   Problem: Clinical Measurements: Goal: Cardiovascular complication will be avoided Outcome: Progressing   Problem: Nutrition: Goal: Adequate nutrition will be maintained Outcome: Progressing   Problem: Coping: Goal: Level of anxiety will decrease Outcome: Progressing   Problem: Pain Managment: Goal: General experience of comfort will improve Outcome: Progressing   Problem: Skin Integrity: Goal: Risk for impaired skin integrity will decrease Outcome: Progressing   Problem: Clinical Measurements: Goal: Postoperative complications will be avoided or minimized Outcome: Progressing   Problem: Respiratory: Goal: Respiratory status will improve Outcome: Progressing   Problem: Pain Management: Goal: Pain level will decrease Outcome: Progressing   Problem: Skin Integrity: Goal: Wound healing without signs and symptoms infection will improve Outcome: Progressing

## 2018-09-23 ENCOUNTER — Inpatient Hospital Stay (HOSPITAL_COMMUNITY): Payer: BLUE CROSS/BLUE SHIELD

## 2018-09-23 NOTE — Progress Notes (Signed)
Changed chest tube container to mini express from [place] per order. No complications noted. XR at bedside for ordered noon 1v cxr. Will continue to monitor.

## 2018-09-23 NOTE — Progress Notes (Addendum)
9 Days Post-Op Procedure(s) (LRB): RIGHT VIDEO ASSISTED THORACOSCOPY (VATS)/ RIGHT UPPER LOBECTOMY (Right) Subjective: No new complaints or issues.   Objective: Vital signs in last 24 hours: Temp:  [97.6 F (36.4 C)-98.4 F (36.9 C)] 97.6 F (36.4 C) (02/26 0300) Pulse Rate:  [57-69] 57 (02/26 0300) Cardiac Rhythm: Normal sinus rhythm (02/26 0741) Resp:  [15-17] 15 (02/25 1811) BP: (121-144)/(70-82) 144/82 (02/26 0300) SpO2:  [98 %] 98 % (02/26 0723) FiO2 (%):  [21 %] 21 % (02/26 0723)   Intake/Output from previous day: No intake/output data recorded. Intake/Output this shift: No intake/output data recorded.  General appearance:alert, cooperative and no distress Heart:regular rate and rhythm Lungs:clear to auscultation bilaterally. CT on -10cmH2O suction.  Continues to have a small air leak. CXR shows right lung expansion similar to yesterday. Abdomen:soft, non-tender; bowel sounds normal; no masses, no organomegaly. Extremities:extremities normal, atraumatic, no cyanosis or edema Wound:clean and dry  Lab Results: No results for input(s): WBC, HGB, HCT, PLT in the last 72 hours. BMET: No results for input(s): NA, K, CL, CO2, GLUCOSE, BUN, CREATININE, CALCIUM in the last 72 hours.  PT/INR: No results for input(s): LABPROT, INR in the last 72 hours. ABG    Component Value Date/Time   PHART 7.474 (H) 09/15/2018 0750   HCO3 25.0 09/15/2018 0750   TCO2 20 (L) 09/14/2018 1547   ACIDBASEDEF 6.0 (H) 09/14/2018 1547   O2SAT 94.1 09/15/2018 0750   CBG (last 3)  No results for input(s): GLUCAP in the last 72 hours.  Assessment/Plan: S/P Procedure(s) (LRB): RIGHT VIDEO ASSISTED THORACOSCOPY (VATS)/ RIGHT UPPER LOBECTOMY (Right)  -Postop day8right video-assisted thoracoscopy with right upper lobectomy. Chest tube on low suction past 48 hours after being on water seal for 3 days. Has slow air leak unchanged from yesterday. Will consider  repeat trial of CT on  water seal and repeat CXR later today. Resume low suction if PTX is enlarged on water seal.CXR in AM. If the right lung remains expanded, we can consider discharge with CT connected to a Mini Atrium Express. -DVT prophylaxis-continue enoxaparin -History of hypertension. BP stable continue Norvasc, metoprolol.   LOS: 21 days    Malon Kindle [phone] 09/23/2018 Patient seen and examined, agree with above Will try CT to mini-express  Aliveah Gallant C. Roxan Hockey, MD [facility] [phone]

## 2018-09-24 ENCOUNTER — Inpatient Hospital Stay (HOSPITAL_COMMUNITY): Payer: BLUE CROSS/BLUE SHIELD

## 2018-09-24 ENCOUNTER — Other Ambulatory Visit (HOSPITAL_COMMUNITY): Payer: Self-pay | Admitting: Physician Assistant

## 2018-09-24 ENCOUNTER — Other Ambulatory Visit: Payer: Self-pay | Admitting: *Deleted

## 2018-09-24 MED ORDER — NICOTINE 14 MG/24HR TD PT24: 14.0000 mg | MEDICATED_PATCH | Freq: Every day | TRANSDERMAL | 0 refills | Status: DC

## 2018-09-24 MED ORDER — AMLODIPINE BESYLATE 5 MG PO TABS: 5.0000 mg | ORAL_TABLET | Freq: Every day | ORAL | 1 refills | Status: DC

## 2018-09-24 MED ORDER — OXYCODONE HCL 5 MG PO TABS: 5.0000 mg | ORAL_TABLET | ORAL | 0 refills | Status: DC | PRN

## 2018-09-24 NOTE — Discharge Instructions (Signed)
Video-Assisted Thoracic Surgery, Care After °This sheet gives you information about how to care for yourself after your procedure. Your health care provider may also give you more specific instructions. If you have problems or questions, contact your health care provider. °What can I expect after the procedure? °After the procedure, it is common to have: °· Some pain and soreness in your chest. °· Pain when breathing in (inhaling) and coughing. °· Constipation. °· Fatigue. °· Difficulty sleeping. °Follow these instructions at home: °Preventing pneumonia °· Take deep breaths or do breathing exercises as instructed by your health care provider. Doing this helps prevent lung infection (pneumonia). °· Cough frequently. Coughing may cause discomfort, but it is important to clear mucus (phlegm) and expand your lungs. If it hurts to cough, hold a pillow against your chest or place the palms of both hands on top of the incision (use splinting) when you cough. This may help relieve discomfort. °· If you were given an incentive spirometer, use it as directed. An incentive spirometer is a tool that measures how well you are filling your lungs with each breath. °· Participate in pulmonary rehabilitation as directed by your health care provider. This is a program that combines education, exercise, and support from a team of specialists. The goal is to help you heal and get back to your normal activities as soon as possible. °Medicines °· Take over-the-counter or prescription medicines only as told by your health care provider. °· If you have pain, take pain-relieving medicine before your pain becomes severe. This is important because if your pain is under control, you will be able to breathe and cough more comfortably. °· If you were prescribed an antibiotic medicine, take it as told by your health care provider. Do not stop taking the antibiotic even if you start to feel better. °Activity °· Ask your health care provider what  activities are safe for you. °· Avoid activities that use your chest muscles for at least 3-4 weeks. °· Do not lift anything that is heavier than 10 lb (4.5 kg), or the limit that your health care provider tells you, until he or she says that it is safe. °Incision care °· Follow instructions from your health care provider about how to take care of your incision(s). Make sure you: °? Wash your hands with soap and water before you change your bandage (dressing). If soap and water are not available, use hand sanitizer. °? Change your dressing as told by your health care provider. °? Leave stitches (sutures), skin glue, or adhesive strips in place. These skin closures may need to stay in place for 2 weeks or longer. If adhesive strip edges start to loosen and curl up, you may trim the loose edges. Do not remove adhesive strips completely unless your health care provider tells you to do that. °· Keep your dressing dry until it has been removed. °· Check your incision area every day for signs of infection. Check for: °? Redness, swelling, or pain. °? Fluid or blood. °? Warmth. °? Pus or a bad smell. °Bathing °· Do not take baths, swim, or use a hot tub until your health care provider approves. You may take showers. °· After your dressing has been removed, use soap and water to gently wash your incision area. Do not use anything else to clean your incision(s) unless your health care provider tells you to do this. °Driving ° °· Do not drive until your health care provider approves. °· Do not drive or   use heavy machinery while taking prescription pain medicine. Eating and drinking  Eat a healthy, balanced diet as instructed by your health care provider. A healthy diet includes plenty of fresh fruits and vegetables, whole grains, and low-fat (lean) proteins.  Limit foods that are high in fat and processed sugars, such as fried and sweet foods.  Drink enough fluid to keep your urine clear or pale yellow. General  instructions   To prevent or treat constipation while you are taking prescription pain medicine, your health care provider may recommend that you: ? Take over-the-counter or prescription medicines. ? Eat foods that are high in fiber, such as beans, fresh fruits and vegetables, and whole grains.  Do not use any products that contain nicotine or tobacco, such as cigarettes and e-cigarettes. If you need help quitting, ask your health care provider.  Avoid secondhand smoke.  Wear compression stockings as told by your health care provider. These stockings help to prevent blood clots and reduce swelling in your legs.  If you have a chest tube, care for it as instructed by your health care provider. Do not travel by airplane during the 2 weeks after your chest tube is removed, or until your health care provider says that this is safe.  Keep all follow-up visits as told by your health care provider. This is important. Contact a health care provider if:  You have redness, swelling, or pain around an incision.  You have fluid or blood coming from an incision.  Your incision area feels warm to the touch.  You have pus or a bad smell coming from an incision.  You have a fever or chills.  You have nausea or vomiting.  You have pain that does not get better with medicine. Get help right away if:  You have chest pain.  Your heart is fluttering or beating rapidly.  You develop a rash.  You have shortness of breath or trouble breathing.  You are confused.  You have trouble speaking.  You feel weak, light-headed, or dizzy.  You faint. Summary  To help prevent lung infection (pneumonia), take deep breaths or do breathing exercises as instructed by your health care provider.  Cough frequently to clear mucus (phlegm) and expand your lungs. If it hurts to cough, hold a pillow against your chest or place the palms of both hands on top of the incision (use splinting) when you cough.  If  you have pain, take pain-relieving medicine before your pain becomes severe. This is important because if your pain is under control, you will be able to breathe and cough more comfortably.  Ask your health care provider what activities are safe for you. This information is not intended to replace advice given to you by your health care provider. Make sure you discuss any questions you have with your health care provider. Document Released: 11/09/2012 Document Revised: 06/24/2016 Document Reviewed: 06/24/2016 Elsevier Interactive Patient Education  2019 Manning the Mini-Atrium chest tube chamber positioned lower than the tube's insertion site.  May change the dressing around the tube as needed.

## 2018-09-24 NOTE — Care Management Note (Signed)
Case Management Note  Patient Details  Name: Sean Rose MRN: [id number] Date of Birth: 04-04-1956  Subjective/Objective:        Pt s/p Lobectomy/VATS            Action/Plan:   PTA from home with wife.     Expected Discharge Date:  09/24/18               Expected Discharge Plan:  Home/Self Care  In-House Referral:     Discharge planning Services  CM Consult  Post Acute Care Choice:    Choice offered to:     DME Arranged:    DME Agency:     HH Arranged:    HH Agency:     Status of Service:  In process, will continue to follow  If discussed at Long Length of Stay Meetings, dates discussed:    Additional Comments: 09/24/2018  Pt deemed stable for discharge home today.  Attending group did not deem [facility] RN appropriate for assistance with mini express as pt can manage independently until follow up appt.  Pt will discharge home with mini-express and pt has been instructed by attending team on care of device.  Pt will discharge home via private vehicle.  Pt confirms he has PCP and denied barriers with paying for discharge medications.  NO CM needs identified - CM signing off  09/18/18 CT remains however to water seal now.  Pt confirms he has PCP and denied barriers with paying for medications.  Pt continues to independently ambulate while hospitalized.  CM will continue to follow for discharge needs  09/17/2018 Pt remains with CT to suction and PCA.   Maryclare Labrador, RN 09/24/2018, 8:33 AM

## 2018-09-24 NOTE — Progress Notes (Signed)
The proposed treatment discussed in cancer conference 09/24/2018 is for discussion purpose only and is not a binding recommendation.  The patient was not physically examined nor present for their treatment options.  Therefore, final treatment plans cannot be decided.

## 2018-09-24 NOTE — Progress Notes (Addendum)
10 Days Post-Op Procedure(s) (LRB): RIGHT VIDEO ASSISTED THORACOSCOPY (VATS)/ RIGHT UPPER LOBECTOMY (Right) Subjective: Awake and alert, sitting up in bed.  No new problems.  The chest tube was connected to mini atrium express yesterday morning.  Respiratory status is remained stable.  Objective: Vital signs in last 24 hours: Temp:  [97.6 F (36.4 C)-98.6 F (37 C)] 98 F (36.7 C) (02/27 0742) Pulse Rate:  [56-63] 60 (02/27 0742) Cardiac Rhythm: Normal sinus rhythm (02/27 0300) Resp:  [12-19] 14 (02/27 0742) BP: (125-136)/(78-86) 132/82 (02/27 0742) SpO2:  [97 %-100 %] 100 % (02/27 0742)     Intake/Output from previous day: 02/26 0701 - 02/27 0700 In: 120 [P.O.:120] Out: 0  Intake/Output this shift: No intake/output data recorded.  General appearance:alert, cooperative and no distress Heart:regular rate and rhythm Lungs:clear to auscultation bilaterally. CT is connected to a mini atrium express and is on waterseal.  He is only had about 25 mL's of serous fluid drained into the collection chamber since it was placed.  The device was tilted horizontally to test for an air leak.  And no air leak is seen during a cough.  CXR showsright lung expansion similar to yesterday. Abdomen:soft, non-tender; bowel sounds normal; no masses, no organomegaly. Extremities:extremities normal, atraumatic, no cyanosis or edema Wound:clean and dry  Lab Results: No results for input(s): WBC, HGB, HCT, PLT in the last 72 hours. BMET: No results for input(s): NA, K, CL, CO2, GLUCOSE, BUN, CREATININE, CALCIUM in the last 72 hours.  PT/INR: No results for input(s): LABPROT, INR in the last 72 hours. ABG    Component Value Date/Time   PHART 7.474 (H) 09/15/2018 0750   HCO3 25.0 09/15/2018 0750   TCO2 20 (L) 09/14/2018 1547   ACIDBASEDEF 6.0 (H) 09/14/2018 1547   O2SAT 94.1 09/15/2018 0750   CBG (last 3)  No results for input(s): GLUCAP in the last 72 hours.  Assessment/Plan: S/P  Procedure(s) (LRB): RIGHT VIDEO ASSISTED THORACOSCOPY (VATS)/ RIGHT UPPER LOBECTOMY (Right)  -Postop day9right video-assisted thoracoscopy with right upper lobectomy. This morning's chest x-ray is stable with a small apical airspace with CT connected to a Mini Atrium Express.  I do not see an active air leak this morning.  We will discharge Mr. Bostwick to home with the mini atrium express.  He has been instructed in its care.  We will ask him to follow-up in the office early next week with a chest x-ray and will likely be able to remove the device at that time -DVT prophylaxis-continue enoxaparin -History of hypertension. BP stable continue Norvasc, metoprolol.   LOS: 22 days    [name], [place] [phone] 09/24/2018 Patient seen and examined, agree with above Hard to tell if there is an air leak, but I think there probably is a small one He is anxious to go home, so will dc and see back in office next week  Remo Lipps C. Roxan Hockey, MD [facility] [phone]

## 2018-09-27 ENCOUNTER — Telehealth: Payer: Self-pay | Admitting: Internal Medicine

## 2018-09-27 NOTE — Telephone Encounter (Signed)
PCCM:  [place], please call and check on him tomorrow. Or someone from triage.  I am out of the office tomorrow.   Thanks  Garner Nash, DO [facility] 09/27/2018 5:53 PM

## 2018-09-27 NOTE — Telephone Encounter (Signed)
Patient wife had called through answering service yesterday afternoon approx 4pm or sp on Saturday 09/26/2018 reporting increased dyspnea and lower sbp 106 or so. He went home with chest tube with air leak. I advised ER repeatedly. Chart review 7:59 AM 09/27/2018 indicates they did not go to ER.  He has appt with Dr Roxan Hockey 09/29/2018 and Icard 10/02/2018  Triage/Brad: please call patient Monday 09/28/2018 and check on how he is doing   Thanks    SIGNATURE    Dr. Brand Males, M.D., F.C.C.P,  Pulmonary and Critical Care Medicine Staff Physician, [place] Director - Interstitial Lung Disease  Program  [facility] [place] [facility] [place], [place], [address]  Pager: [phone], If no answer or between  15:00h - 7:00h: call [phone] Telephone: [phone]  8:00 AM 09/27/2018

## 2018-09-28 ENCOUNTER — Other Ambulatory Visit: Payer: Self-pay | Admitting: Thoracic Surgery (Cardiothoracic Vascular Surgery)

## 2018-09-28 DIAGNOSIS — R911 Solitary pulmonary nodule: Secondary | ICD-10-CM

## 2018-09-28 NOTE — Telephone Encounter (Signed)
Ok thanks. Forwarding to Dr Valeta Harms. It is his patient

## 2018-09-28 NOTE — Telephone Encounter (Signed)
Call made to patient, he states he has some pain, constipation, and increased fatigue. He states he still is having increased SOB but he thinks it is more from his anxiety and stress. He has tried several things over the counter but it does not seem to help. He states he had this problem in the hospital. He states his expectation was that he would be getting stronger day by day but he states it seems he is getting weaker and weaker. He states he continues to get a deep pain in his right chest and in his sternum. He states his average BP was 105/60. He states he can feel the difference when its this low. He states he knows his body and currently he is stable. He states he declined the ER because he know if would be filled with germs and he would be waiting for hours to be attended to. He states he does see his surgeon tomorrow at 9 and currently he feels stable outside of intermittent chest pain.

## 2018-09-28 NOTE — Telephone Encounter (Signed)
Thanks for checking on him.  BLI

## 2018-09-29 ENCOUNTER — Encounter: Payer: Self-pay | Admitting: Thoracic Surgery (Cardiothoracic Vascular Surgery)

## 2018-09-29 ENCOUNTER — Ambulatory Visit
Admission: RE | Admit: 2018-09-29 | Discharge: 2018-09-29 | Disposition: A | Discharge status: Home / Self Care | Payer: BLUE CROSS/BLUE SHIELD | Source: Ambulatory Visit | Attending: Thoracic Surgery (Cardiothoracic Vascular Surgery) | Admitting: Thoracic Surgery (Cardiothoracic Vascular Surgery)

## 2018-09-29 ENCOUNTER — Other Ambulatory Visit: Payer: Self-pay

## 2018-09-29 ENCOUNTER — Ambulatory Visit (INDEPENDENT_AMBULATORY_CARE_PROVIDER_SITE_OTHER): Payer: Self-pay | Admitting: Thoracic Surgery (Cardiothoracic Vascular Surgery)

## 2018-09-29 VITALS — BP 134/82 | HR 70 | Resp 16 | Ht 68.0 in | Wt 121.8 lb

## 2018-09-29 DIAGNOSIS — C3491 Malignant neoplasm of unspecified part of right bronchus or lung: Secondary | ICD-10-CM

## 2018-09-29 DIAGNOSIS — Z09 Encounter for follow-up examination after completed treatment for conditions other than malignant neoplasm: Secondary | ICD-10-CM

## 2018-09-29 DIAGNOSIS — J939 Pneumothorax, unspecified: Secondary | ICD-10-CM | POA: Diagnosis not present

## 2018-09-29 DIAGNOSIS — Z902 Acquired absence of lung [part of]: Secondary | ICD-10-CM

## 2018-09-29 DIAGNOSIS — R911 Solitary pulmonary nodule: Secondary | ICD-10-CM

## 2018-09-29 DIAGNOSIS — J439 Emphysema, unspecified: Secondary | ICD-10-CM | POA: Diagnosis not present

## 2018-09-29 NOTE — Progress Notes (Signed)
[address]       [place] [address]             [phone]      HPI: Sean Rose returns for scheduled follow-up appointment  Sean Rose is a 63 year old gentleman with a history of tobacco abuse who was found to have multiple right upper lobe lung nodules.  He developed a pneumothorax after biopsy requiring chest tube placement.  He remained in the hospital for about 10 days.  We ultimately went ahead and did a right upper lobectomy.  Not surprisingly he had a prolonged air leak afterwards and went home with a chest tube in.  He now returns for follow-up.  He has been feeling well.  He is rarely having to take pain medication.  He is hoping to get the tube out.  Past Medical History:  Diagnosis Date  . Anginal pain ([place])   . CAD (coronary artery disease)   . CAP (community acquired pneumonia) 09/2016  . COPD (chronic obstructive pulmonary disease) ([place])   . GERD (gastroesophageal reflux disease)   . Heart murmur    "I was told I had one when I was a kid"  . Hemorrhoids   . History of anal fissures    "no surgeries" (10/30/2016)  . Hyperlipidemia   . Hypertension   . Peripheral arterial disease ([place])    status post right common iliac artery stenting back in 2007  . Seasonal allergies   . Tobacco abuse     Current Outpatient Medications  Medication Sig Dispense Refill  . amLODipine (NORVASC) 5 MG tablet Take 1 tablet (5 mg total) by mouth daily. 30 tablet 1  . aspirin EC 81 MG tablet Take 81 mg by mouth daily.    . clopidogrel (PLAVIX) 75 MG tablet TAKE 1 TABLET BY MOUTH EVERY DAY (Patient taking differently: Take 75 mg by mouth daily. ) 90 tablet 1  . diphenhydrAMINE (BENADRYL) 25 mg capsule Take 25 mg by mouth daily as needed for allergies.     Marland Kitchen ibuprofen (ADVIL,MOTRIN) 200 MG tablet Take 400 mg by mouth every 6 (six) hours as needed for headache or moderate pain.     . Magnesium 250 MG TABS Take 250 mg by mouth daily.     . metoprolol succinate  (TOPROL-XL) 100 MG 24 hr tablet TAKE 1 TABLET BY MOUTH EVERY DAY (Patient taking differently: Take 100 mg by mouth daily. ) 90 tablet 1  . Multiple Vitamin (MULTIVITAMIN WITH MINERALS) TABS tablet Take 1 tablet by mouth daily.    . nicotine (NICODERM CQ - DOSED IN MG/24 HOURS) 14 mg/24hr patch Place 1 patch (14 mg total) onto the skin daily. 28 patch 0  . oxyCODONE (OXY IR/ROXICODONE) 5 MG immediate release tablet Take 1 tablet (5 mg total) by mouth every 4 (four) hours as needed for severe pain. 20 tablet 0  . umeclidinium-vilanterol (ANORO ELLIPTA) 62.5-25 MCG/INH AEPB Inhale 1 puff into the lungs daily. 60 each 3   No current facility-administered medications for this visit.     Physical Exam BP 134/82 (BP Location: Right Arm, Patient Position: Sitting, Cuff Size: Normal)   Pulse 70   Resp 16   Ht 5\' 8"  (1.727 m)   Wt 121 lb 12.8 oz (55.2 kg)   SpO2 97% Comment: ON RA  BMI 18.35 kg/m  63 year old man in no acute distress Alert and oriented x3 with no focal deficits Audible air movement on right, left clear  Stream tidal variation with coughing and positive air leak  Diagnostic Tests: CHEST - 2 VIEW  COMPARISON:  Single-view of the chest 09/24/2018 and 09/23/2018.  FINDINGS: Right chest tube remains in place. Right pneumothorax seen on yesterday's examination has slightly increased in size and is estimated at 5-10%. Unchanged volume loss in the right chest consistent with surgery noted. The left lung is expanded and clear. Heart size is normal. Aortic atherosclerosis is seen.  IMPRESSION: Slight increase in a right pneumothorax estimated at 5-10%. Right chest tube remains in place.  Lungs are clear.  Emphysema.   Electronically Signed   By: Inge Rise M.D.   On: 09/29/2018 09:47   Impression: Sean Rose is a 63 year old gentleman who underwent a right upper lobectomy for multifocal adenocarcinoma on 09/14/2018.  He has had a prolonged air leak  postoperatively.  He now is about 2 weeks out from surgery and still has a leak.  He is at home with a chest tube in place.  He is managing that well.  I will plan to see him back again on Friday.  We will do another chest x-ray and then check for leak as well.  We may end up having to consider IBV placement if his leak persist.  Plan:  Return on Friday, 10/02/2018  Melrose Nakayama, MD [facility] [phone]

## 2018-09-30 ENCOUNTER — Telehealth: Payer: Self-pay | Admitting: Cardiovascular Disease

## 2018-09-30 ENCOUNTER — Telehealth: Payer: Self-pay | Admitting: Pulmonary Disease

## 2018-09-30 DIAGNOSIS — R911 Solitary pulmonary nodule: Secondary | ICD-10-CM

## 2018-09-30 NOTE — Telephone Encounter (Signed)
PCCM:  Great. No worries. Thanks for letting me know.   Garner Nash, DO [facility] 09/30/2018 5:21 PM

## 2018-09-30 NOTE — Telephone Encounter (Signed)
Referral was placed and I spoke with the pt and notified that this was done  He verbalized understanding and nothing further needed

## 2018-09-30 NOTE — Telephone Encounter (Signed)
Spoke with the pt  He is under the impression that he needs referral to oncology  He is unsure who said this to him b/c he has seen multiple providers  Do we need to place referral? Please advise and route back to triage pool, thanks

## 2018-09-30 NOTE — Telephone Encounter (Signed)
Returned call to [place] with [employer] no answer.[place].

## 2018-09-30 NOTE — Telephone Encounter (Signed)
Sean Rose had a planned follow up with [name] on 3/10.  He will need adjuvant chemo and my plan was to get him in after he sees [name].  I updated Dr. [name] and his is for patient to come after [name] sees him.  Sorry I did not communicate that to you.

## 2018-09-30 NOTE — Telephone Encounter (Signed)
  Radonna Ricker would like to speak to the nurse to get information on the plan of care for this mutual patient

## 2018-09-30 NOTE — Telephone Encounter (Signed)
PCCM:  Please refer to Dr. [name]. I will forward this message to him and Norton Blizzard.   Thanks  Garner Nash, DO [facility] 09/30/2018 12:33 PM

## 2018-10-01 ENCOUNTER — Other Ambulatory Visit: Payer: Self-pay | Admitting: Thoracic Surgery (Cardiothoracic Vascular Surgery)

## 2018-10-01 DIAGNOSIS — R911 Solitary pulmonary nodule: Secondary | ICD-10-CM

## 2018-10-02 ENCOUNTER — Telehealth: Payer: Self-pay | Admitting: *Deleted

## 2018-10-02 ENCOUNTER — Inpatient Hospital Stay: Payer: BLUE CROSS/BLUE SHIELD | Admitting: Pulmonary Disease

## 2018-10-02 ENCOUNTER — Ambulatory Visit (INDEPENDENT_AMBULATORY_CARE_PROVIDER_SITE_OTHER): Payer: Self-pay | Admitting: Thoracic Surgery (Cardiothoracic Vascular Surgery)

## 2018-10-02 ENCOUNTER — Ambulatory Visit
Admission: RE | Admit: 2018-10-02 | Discharge: 2018-10-02 | Disposition: A | Discharge status: Home / Self Care | Payer: BLUE CROSS/BLUE SHIELD | Source: Ambulatory Visit | Attending: Thoracic Surgery (Cardiothoracic Vascular Surgery) | Admitting: Thoracic Surgery (Cardiothoracic Vascular Surgery)

## 2018-10-02 VITALS — BP 136/82 | HR 67 | Resp 20 | Ht 68.0 in | Wt 123.0 lb

## 2018-10-02 DIAGNOSIS — R911 Solitary pulmonary nodule: Secondary | ICD-10-CM

## 2018-10-02 DIAGNOSIS — C3491 Malignant neoplasm of unspecified part of right bronchus or lung: Secondary | ICD-10-CM

## 2018-10-02 DIAGNOSIS — Z902 Acquired absence of lung [part of]: Secondary | ICD-10-CM

## 2018-10-02 DIAGNOSIS — J939 Pneumothorax, unspecified: Secondary | ICD-10-CM | POA: Diagnosis not present

## 2018-10-02 LAB — FUNGAL ORGANISM REFLEX

## 2018-10-02 LAB — FUNGUS CULTURE WITH STAIN

## 2018-10-02 LAB — FUNGUS CULTURE RESULT

## 2018-10-02 NOTE — Telephone Encounter (Signed)
Oncology Nurse Navigator Documentation  Oncology Nurse Navigator Flowsheets 10/02/2018  Navigator Location [facility]  Referral date to RadOnc/MedOnc 10/01/2018  Navigator Encounter Type Telephone/I received referral on Sean Rose.  I called to schedule but was unable to reach him. I did leave a vm message with my name and phone number to call.   Telephone Outgoing Call  Barriers/Navigation Needs Education  Education Other  Interventions Education  Acuity Level 1  Time Spent with Patient 15

## 2018-10-02 NOTE — Telephone Encounter (Signed)
Oncology Nurse Navigator Documentation  Oncology Nurse Navigator Flowsheets 10/02/2018  Navigator Location [facility]  Navigator Encounter Type Telephone/I received a call from Mr. Christofferson.  I scheduled him to be seen with Dr. [name] on 10/13/2018.  He verbalized understanding of appt time and place.   Telephone Incoming Call  [employer];Coordination of Care  Education Other  Interventions Coordination of Care;Education  Coordination of Care Appts  Acuity Level 1  Time Spent with Patient 15

## 2018-10-02 NOTE — Progress Notes (Signed)
[address]       [place], [address]             [phone]      HPI: Mr. Chriscoe returns for scheduled visit  Treylen Gibbs is a 63 year old gentleman with a history of tobacco abuse who was found to have multiple right upper lobe lung nodules.  He developed a pneumothorax after a biopsy requiring chest tube placement.  He remained in the hospital for about 10 days.  We ultimately went ahead and did a right upper lobectomy.  Not surprisingly he had a prolonged air leak afterwards and went home with a chest tube in.  He now returns for follow-up.  He has been feeling well. He is hoping to get the tube out.  When I saw him on Tuesday he was not having much pain.  Over the last couple of days he has been having significantly more pain from the tube.  He also feels some pain around the scapular region.  He has been taking oxycodone before he goes to bed at night but that he seems to have strange dreams when he does so.  Past Medical History:  Diagnosis Date  . Anginal pain ([place])   . CAD (coronary artery disease)   . CAP (community acquired pneumonia) 09/2016  . COPD (chronic obstructive pulmonary disease) ([place])   . GERD (gastroesophageal reflux disease)   . Heart murmur    "I was told I had one when I was a kid"  . Hemorrhoids   . History of anal fissures    "no surgeries" (10/30/2016)  . Hyperlipidemia   . Hypertension   . Peripheral arterial disease ([place])    status post right common iliac artery stenting back in 2007  . Seasonal allergies   . Tobacco abuse     Current Outpatient Medications  Medication Sig Dispense Refill  . amLODipine (NORVASC) 5 MG tablet Take 1 tablet (5 mg total) by mouth daily. 30 tablet 1  . aspirin EC 81 MG tablet Take 81 mg by mouth daily.    . clopidogrel (PLAVIX) 75 MG tablet TAKE 1 TABLET BY MOUTH EVERY DAY (Patient taking differently: Take 75 mg by mouth daily. ) 90 tablet 1  . diphenhydrAMINE (BENADRYL) 25 mg capsule Take 25 mg  by mouth daily as needed for allergies.     Marland Kitchen ibuprofen (ADVIL,MOTRIN) 200 MG tablet Take 400 mg by mouth every 6 (six) hours as needed for headache or moderate pain.     . Magnesium 250 MG TABS Take 250 mg by mouth daily.     . metoprolol succinate (TOPROL-XL) 100 MG 24 hr tablet TAKE 1 TABLET BY MOUTH EVERY DAY (Patient taking differently: Take 100 mg by mouth daily. ) 90 tablet 1  . Multiple Vitamin (MULTIVITAMIN WITH MINERALS) TABS tablet Take 1 tablet by mouth daily.    . nicotine (NICODERM CQ - DOSED IN MG/24 HOURS) 14 mg/24hr patch Place 1 patch (14 mg total) onto the skin daily. 28 patch 0  . oxyCODONE (OXY IR/ROXICODONE) 5 MG immediate release tablet Take 1 tablet (5 mg total) by mouth every 4 (four) hours as needed for severe pain. 20 tablet 0  . umeclidinium-vilanterol (ANORO ELLIPTA) 62.5-25 MCG/INH AEPB Inhale 1 puff into the lungs daily. 60 each 3   No current facility-administered medications for this visit.     Physical Exam BP 136/82   Pulse 67   Resp 20   Ht 5'  8" (1.727 m)   Wt 123 lb (55.8 kg)   SpO2 95% Comment: RA  BMI 18.49 kg/m  63 year old man in no acute distress Alert and oriented x3 with no focal deficits Incision well-healed Chest tube site with some erythema  Diagnostic Tests: CHEST - 2 VIEW  COMPARISON:  09/29/2018, 09/24/2018, 09/23/2018  FINDINGS: Right-sided chest tube remains in place with tip at the right peripheral apex. Postsurgical changes in the right upper lung. Persistent small right apical pneumothorax, slightly decreased in size. No focal airspace disease. Normal heart size with aortic atherosclerosis.  IMPRESSION: Slight interval decrease in small right apical pneumothorax.   Electronically Signed   By: Donavan Foil M.D.   On: 10/02/2018 15:12 I personally reviewed the chest x-ray images and concur with the findings noted above  Impression: Mr. Dollar is a 63 year old gentleman who had a right upper lobectomy for  multifocal adenocarcinoma.  He had a prolonged air leak afterwards and went home with a chest tube.  His chest x-ray today shows a decrease in the size of his apical space and his air leak is more intermittent.  The tube is not ready to come out, but I am encouraged that the leak seems to be smaller and his x-ray has improved.  We discussed raised for pain control on combining the oxycodone either with Advil or Tylenol to help potentiate the effect.  He did not want to try different narcotic.  Plan: Return on Monday with PA and lateral chest Xray  Melrose Nakayama, MD [facility] [phone]

## 2018-10-05 ENCOUNTER — Other Ambulatory Visit: Payer: Self-pay | Admitting: Thoracic Surgery (Cardiothoracic Vascular Surgery)

## 2018-10-05 ENCOUNTER — Ambulatory Visit
Admission: RE | Admit: 2018-10-05 | Discharge: 2018-10-05 | Disposition: A | Discharge status: Home / Self Care | Payer: BLUE CROSS/BLUE SHIELD | Source: Ambulatory Visit | Attending: Thoracic Surgery (Cardiothoracic Vascular Surgery) | Admitting: Thoracic Surgery (Cardiothoracic Vascular Surgery)

## 2018-10-05 ENCOUNTER — Other Ambulatory Visit: Payer: Self-pay | Admitting: Cardiovascular Disease

## 2018-10-05 ENCOUNTER — Ambulatory Visit (INDEPENDENT_AMBULATORY_CARE_PROVIDER_SITE_OTHER): Payer: Self-pay | Admitting: Thoracic Surgery (Cardiothoracic Vascular Surgery)

## 2018-10-05 ENCOUNTER — Other Ambulatory Visit: Payer: Self-pay

## 2018-10-05 ENCOUNTER — Encounter: Payer: Self-pay | Admitting: Thoracic Surgery (Cardiothoracic Vascular Surgery)

## 2018-10-05 VITALS — BP 110/60 | HR 74 | Resp 16 | Ht 68.0 in | Wt 118.8 lb

## 2018-10-05 DIAGNOSIS — J95812 Postprocedural air leak: Secondary | ICD-10-CM

## 2018-10-05 DIAGNOSIS — C3491 Malignant neoplasm of unspecified part of right bronchus or lung: Secondary | ICD-10-CM

## 2018-10-05 DIAGNOSIS — J939 Pneumothorax, unspecified: Secondary | ICD-10-CM | POA: Diagnosis not present

## 2018-10-05 DIAGNOSIS — Z09 Encounter for follow-up examination after completed treatment for conditions other than malignant neoplasm: Secondary | ICD-10-CM

## 2018-10-05 DIAGNOSIS — R918 Other nonspecific abnormal finding of lung field: Secondary | ICD-10-CM

## 2018-10-05 DIAGNOSIS — Z902 Acquired absence of lung [part of]: Secondary | ICD-10-CM

## 2018-10-05 DIAGNOSIS — J95811 Postprocedural pneumothorax: Secondary | ICD-10-CM

## 2018-10-05 NOTE — Progress Notes (Unsigned)
cxr 

## 2018-10-05 NOTE — Progress Notes (Signed)
[address]       [place] [address]             [phone]       HPI: Sean Rose returns for follow-up of his air leak.  Sean Rose is a 63 year old man old man with a history of tobacco abuse who was found to have multiple right upper lobe lung nodules.  Those turned out to be adenocarcinomas.  He had a bronchoscopy with biopsy.  He developed a pneumothorax afterwards.  He had about a 10-day air leak.  Ultimately went ahead and did a right upper lobectomy.  He had a prolonged air leak after that.  He went home with a chest tube in.  I saw him back in the office on Friday and his air leak was significantly decreased as well as his apical space.  He has been having more back pain.  He says this is centered around the right scapula.  He has lost a considerable amount of weight and thinks it may just be from pressure on the way he has to lie to get comfortable with a chest tube in.  He has not seen any bubbling in the chest tube.  Past Medical History:  Diagnosis Date  . Anginal pain ([place])   . CAD (coronary artery disease)   . CAP (community acquired pneumonia) 09/2016  . COPD (chronic obstructive pulmonary disease) ([place])   . GERD (gastroesophageal reflux disease)   . Heart murmur    "I was told I had one when I was a kid"  . Hemorrhoids   . History of anal fissures    "no surgeries" (10/30/2016)  . Hyperlipidemia   . Hypertension   . Peripheral arterial disease ([place])    status post right common iliac artery stenting back in 2007  . Seasonal allergies   . Tobacco abuse      Current Outpatient Medications  Medication Sig Dispense Refill  . amLODipine (NORVASC) 5 MG tablet Take 1 tablet (5 mg total) by mouth daily. 30 tablet 1  . aspirin EC 81 MG tablet Take 81 mg by mouth daily.    . diphenhydrAMINE (BENADRYL) 25 mg capsule Take 25 mg by mouth daily as needed for allergies.     Marland Kitchen ibuprofen (ADVIL,MOTRIN) 200 MG tablet Take 400 mg by mouth every 6 (six) hours as  needed for headache or moderate pain.     . Magnesium 250 MG TABS Take 250 mg by mouth daily.     . metoprolol succinate (TOPROL-XL) 100 MG 24 hr tablet TAKE 1 TABLET BY MOUTH EVERY DAY (Patient taking differently: Take 100 mg by mouth daily. ) 90 tablet 1  . Multiple Vitamin (MULTIVITAMIN WITH MINERALS) TABS tablet Take 1 tablet by mouth daily.    . nicotine (NICODERM CQ - DOSED IN MG/24 HOURS) 14 mg/24hr patch Place 1 patch (14 mg total) onto the skin daily. 28 patch 0  . oxyCODONE (OXY IR/ROXICODONE) 5 MG immediate release tablet Take 1 tablet (5 mg total) by mouth every 4 (four) hours as needed for severe pain. 20 tablet 0  . umeclidinium-vilanterol (ANORO ELLIPTA) 62.5-25 MCG/INH AEPB Inhale 1 puff into the lungs daily. 60 each 3  . clopidogrel (PLAVIX) 75 MG tablet TAKE 1 TABLET BY MOUTH EVERY DAY 90 tablet 1   No current facility-administered medications for this visit.     Physical Exam BP 110/60 (BP Location: Left Arm, Patient Position: Sitting, Cuff Size: Normal) Comment (Cuff Size):  manually  Pulse 74   Resp 16   Ht 5\' 8"  (1.727 m)   Wt 118 lb 12.8 oz (53.9 kg)   SpO2 98% Comment: RA  BMI 18.34 kg/m  63 year old man in no acute distress Alert and oriented x3 with no focal deficits No apparent air leak with multiple coughs  Diagnostic Tests: CHEST - 2 VIEW  COMPARISON:  October 02, 2018  FINDINGS: The right chest tube remains in place. A tiny right apical pneumothorax persists but is smaller in the interval. The cardiomediastinal silhouette is stable. No other interval changes.  IMPRESSION: The right chest tube remains stable. The small right apical pneumothorax persists but is smaller in the interval. No other changes.   Electronically Signed   By: Dorise Bullion III M.D   On: 10/05/2018 13:27 I personally reviewed the chest x-ray.  I do not see any significant "pneumothorax".  There may be a minuscule space where the lower and middle lobes meet laterally,  but the x-ray is definitely improved from his previous film.  Impression: Sean Rose is a 63 year old gentleman with advanced stage adenocarcinoma.  He developed an air leak after bronchoscopic biopsy and then underwent right upper lobectomy.  He then developed an air leak after that as well.  He went home with a chest tube in.  He still had a moderate leak when he left the hospital.  It was markedly improved at the end of last week.  Now today there is no visible leak.  I am a little bit nervous about pulling the tube just based on how long he has had to have it in.  I think the safest thing to do would be to clamp the tube and then repeat his x-ray in the morning.  If his x-ray looks good and there is no air when we remove the clamp we will remove his tube in the office.  He was instructed on how to unclamp the tube by sliding the clamp to the open position and demonstrated understanding.  He was instructed to do so immediately if he develops any kind of increasing pain, shortness of breath, or subcutaneous emphysema.  Plan: Return to office tomorrow morning with a PA and lateral chest x-ray  Melrose Nakayama, MD [facility] [phone]

## 2018-10-06 ENCOUNTER — Encounter: Payer: Self-pay | Admitting: Thoracic Surgery (Cardiothoracic Vascular Surgery)

## 2018-10-06 ENCOUNTER — Other Ambulatory Visit: Payer: Self-pay | Admitting: Thoracic Surgery (Cardiothoracic Vascular Surgery)

## 2018-10-06 ENCOUNTER — Other Ambulatory Visit: Payer: Self-pay | Admitting: *Deleted

## 2018-10-06 ENCOUNTER — Ambulatory Visit: Payer: Self-pay | Admitting: Thoracic Surgery (Cardiothoracic Vascular Surgery)

## 2018-10-06 ENCOUNTER — Other Ambulatory Visit: Payer: Self-pay

## 2018-10-06 ENCOUNTER — Ambulatory Visit
Admission: RE | Admit: 2018-10-06 | Discharge: 2018-10-06 | Disposition: A | Discharge status: Home / Self Care | Payer: BLUE CROSS/BLUE SHIELD | Source: Ambulatory Visit | Attending: Thoracic Surgery (Cardiothoracic Vascular Surgery) | Admitting: Thoracic Surgery (Cardiothoracic Vascular Surgery)

## 2018-10-06 ENCOUNTER — Ambulatory Visit (INDEPENDENT_AMBULATORY_CARE_PROVIDER_SITE_OTHER): Payer: BLUE CROSS/BLUE SHIELD | Admitting: Thoracic Surgery (Cardiothoracic Vascular Surgery)

## 2018-10-06 VITALS — BP 142/84 | HR 77 | Resp 18 | Ht 68.0 in | Wt 118.0 lb

## 2018-10-06 DIAGNOSIS — J95811 Postprocedural pneumothorax: Secondary | ICD-10-CM

## 2018-10-06 DIAGNOSIS — J939 Pneumothorax, unspecified: Secondary | ICD-10-CM

## 2018-10-06 DIAGNOSIS — J95812 Postprocedural air leak: Secondary | ICD-10-CM

## 2018-10-06 DIAGNOSIS — Z902 Acquired absence of lung [part of]: Secondary | ICD-10-CM

## 2018-10-06 NOTE — Progress Notes (Addendum)
[address]       [place] [address]             [phone]       HPI: Mr. Sean Rose returns as scheduled  Mr. Sean Rose is a 63 year old gentleman whose had a prolonged air leak after a right upper lobectomy.  He had no apparent air leak yesterday in the office.  We clamped his chest tube.  He now returns for follow-up.  He did not have any issues with the tube clamped overnight.  Past Medical History:  Diagnosis Date  . Anginal pain ([place])   . CAD (coronary artery disease)   . CAP (community acquired pneumonia) 09/2016  . COPD (chronic obstructive pulmonary disease) ([place])   . GERD (gastroesophageal reflux disease)   . Heart murmur    "I was told I had one when I was a kid"  . Hemorrhoids   . History of anal fissures    "no surgeries" (10/30/2016)  . Hyperlipidemia   . Hypertension   . Peripheral arterial disease ([place])    status post right common iliac artery stenting back in 2007  . Seasonal allergies   . Tobacco abuse     Current Outpatient Medications  Medication Sig Dispense Refill  . amLODipine (NORVASC) 5 MG tablet Take 1 tablet (5 mg total) by mouth daily. 30 tablet 1  . aspirin EC 81 MG tablet Take 81 mg by mouth daily.    . clopidogrel (PLAVIX) 75 MG tablet TAKE 1 TABLET BY MOUTH EVERY DAY 90 tablet 1  . diphenhydrAMINE (BENADRYL) 25 mg capsule Take 25 mg by mouth daily as needed for allergies.     Marland Kitchen ibuprofen (ADVIL,MOTRIN) 200 MG tablet Take 400 mg by mouth every 6 (six) hours as needed for headache or moderate pain.     . Magnesium 250 MG TABS Take 250 mg by mouth daily.     . metoprolol succinate (TOPROL-XL) 100 MG 24 hr tablet TAKE 1 TABLET BY MOUTH EVERY DAY (Patient taking differently: Take 100 mg by mouth daily. ) 90 tablet 1  . Multiple Vitamin (MULTIVITAMIN WITH MINERALS) TABS tablet Take 1 tablet by mouth daily.    . nicotine (NICODERM CQ - DOSED IN MG/24 HOURS) 14 mg/24hr patch Place 1 patch (14 mg total) onto the skin daily. 28 patch  0  . oxyCODONE (OXY IR/ROXICODONE) 5 MG immediate release tablet Take 1 tablet (5 mg total) by mouth every 4 (four) hours as needed for severe pain. 20 tablet 0  . umeclidinium-vilanterol (ANORO ELLIPTA) 62.5-25 MCG/INH AEPB Inhale 1 puff into the lungs daily. 60 each 3   No current facility-administered medications for this visit.     Physical Exam BP (!) 142/84 (BP Location: Left Arm, Patient Position: Sitting, Cuff Size: Normal)   Pulse 77   Resp 18   Ht 5\' 8"  (1.727 m)   Wt 118 lb (53.5 kg)   SpO2 99% Comment: RA  BMI 17.95 kg/m  63 year old man in no acute distress Alert and oriented x3 with no focal deficits Diminished breath sounds right base No air leak from tube  Diagnostic Tests: I reviewed his chest x-ray.  It is unchanged from yesterday's film.  Impression: Sean Rose is a 62 year old man who had a prolonged air leak after a right upper lobectomy for advanced stage adenocarcinoma.  He had a prolonged air leak after bronchoscopic biopsy prior to that.  He has no air leak on exam this morning  and his chest x-ray is stable with his tube clamped for a little over 12 hours.  We will remove the chest tube today.  Procedure Chest tube removed from right chest without difficulty.  Occlusive dressing applied.  Tolerated well   Plan: We will get another chest x-ray post tube removal He has a follow-up appointment with Dr. [name] next week I will see him back in 2 weeks with a repeat PA and lateral chest x-ray  Sean Nakayama, MD [facility] [phone]  Chest x-ray after tube removal shows a small apical pneumo.  This is likely due to air that sucked in the tube was removed.  He will need a follow-up chest x-ray this afternoon.  Sean Standard Roxan Hockey, MD [facility] [phone]  Addendum  Mr. Sean Rose is chest x-ray showed a small pneumothorax after chest tube removal.  He now returns with a follow-up chest  x-ray.  He has had no change in his symptoms since the tube was removed.  He actually feels better than he did prior to tube removal.  He has not noticed any subcutaneous air or shortness of breath, or increase in pain.  On exam Mr. Sean Rose is a well-appearing 63 year old man in no distress  CHEST - 2 VIEW SAME DAY  COMPARISON:  10/06/2018 at 9:31 a.m.  FINDINGS: Right apical pneumothorax is unchanged in size. Postsurgical changes of the upper right lung are again noted. The lungs are clear. Cardiomediastinal contours are normal.  IMPRESSION: Unchanged size of right upper pneumothorax.   Electronically Signed   By: Ulyses Jarred M.D.   On: 10/06/2018 14:46  Small pneumothorax after chest tube removal.  Likely due to his thin chest wall.  I recommended leaving the occlusive dressing in place for 48 hours prior to changing.  He knows to call immediately if he has any chest pain, shortness of breath, or subcutaneous emphysema.  Sean Standard Roxan Hockey, MD [facility] [phone]

## 2018-10-08 DIAGNOSIS — C3490 Malignant neoplasm of unspecified part of unspecified bronchus or lung: Secondary | ICD-10-CM | POA: Diagnosis not present

## 2018-10-13 ENCOUNTER — Other Ambulatory Visit: Payer: Self-pay

## 2018-10-13 ENCOUNTER — Inpatient Hospital Stay (HOSPITAL_BASED_OUTPATIENT_CLINIC_OR_DEPARTMENT_OTHER): Payer: BLUE CROSS/BLUE SHIELD | Admitting: Internal Medicine

## 2018-10-13 ENCOUNTER — Encounter: Payer: Self-pay | Admitting: Internal Medicine

## 2018-10-13 ENCOUNTER — Encounter: Payer: Self-pay | Admitting: *Deleted

## 2018-10-13 ENCOUNTER — Inpatient Hospital Stay: Payer: BLUE CROSS/BLUE SHIELD | Attending: Internal Medicine

## 2018-10-13 DIAGNOSIS — C772 Secondary and unspecified malignant neoplasm of intra-abdominal lymph nodes: Secondary | ICD-10-CM | POA: Insufficient documentation

## 2018-10-13 DIAGNOSIS — C3411 Malignant neoplasm of upper lobe, right bronchus or lung: Secondary | ICD-10-CM | POA: Insufficient documentation

## 2018-10-13 DIAGNOSIS — Z87891 Personal history of nicotine dependence: Secondary | ICD-10-CM

## 2018-10-13 DIAGNOSIS — Z800 Family history of malignant neoplasm of digestive organs: Secondary | ICD-10-CM | POA: Diagnosis not present

## 2018-10-13 DIAGNOSIS — Z5111 Encounter for antineoplastic chemotherapy: Secondary | ICD-10-CM | POA: Insufficient documentation

## 2018-10-13 DIAGNOSIS — Z902 Acquired absence of lung [part of]: Secondary | ICD-10-CM

## 2018-10-13 DIAGNOSIS — C771 Secondary and unspecified malignant neoplasm of intrathoracic lymph nodes: Secondary | ICD-10-CM | POA: Diagnosis not present

## 2018-10-13 DIAGNOSIS — Z7189 Other specified counseling: Secondary | ICD-10-CM | POA: Insufficient documentation

## 2018-10-13 DIAGNOSIS — C3491 Malignant neoplasm of unspecified part of right bronchus or lung: Secondary | ICD-10-CM | POA: Insufficient documentation

## 2018-10-13 LAB — CMP (CANCER CENTER ONLY)
ALT: 21 U/L (ref 0–44)
AST: 17 U/L (ref 15–41)
Albumin: 3.7 g/dL (ref 3.5–5.0)
Alkaline Phosphatase: 98 U/L (ref 38–126)
Anion gap: 12 (ref 5–15)
BILIRUBIN TOTAL: 0.3 mg/dL (ref 0.3–1.2)
BUN: 6 mg/dL — ABNORMAL LOW (ref 8–23)
CO2: 24 mmol/L (ref 22–32)
Calcium: 8.7 mg/dL — ABNORMAL LOW (ref 8.9–10.3)
Chloride: 97 mmol/L — ABNORMAL LOW (ref 98–111)
Creatinine: 0.74 mg/dL (ref 0.61–1.24)
GFR, Est AFR Am: >60 mL/min (ref >60)
GFR, Estimated: >60 mL/min (ref >60)
Glucose, Bld: 118 mg/dL — ABNORMAL HIGH (ref 70–99)
POTASSIUM: 3.4 mmol/L — AB (ref 3.5–5.1)
Sodium: 133 mmol/L — ABNORMAL LOW (ref 135–145)
TOTAL PROTEIN: 7.4 g/dL (ref 6.5–8.1)

## 2018-10-13 LAB — CBC WITH DIFFERENTIAL (CANCER CENTER ONLY)
Abs Immature Granulocytes: 0.01 10*3/uL (ref 0.00–0.07)
Basophils Absolute: 0.1 10*3/uL (ref 0.0–0.1)
Basophils Relative: 1 %
Eosinophils Absolute: 0.3 10*3/uL (ref 0.0–0.5)
Eosinophils Relative: 4 %
HCT: 37.2 % — ABNORMAL LOW (ref 39.0–52.0)
Hemoglobin: 13.1 g/dL (ref 13.0–17.0)
Immature Granulocytes: 0 %
Lymphocytes Relative: 32 %
Lymphs Abs: 2.6 10*3/uL (ref 0.7–4.0)
MCH: 34.8 pg — ABNORMAL HIGH (ref 26.0–34.0)
MCHC: 35.2 g/dL (ref 30.0–36.0)
MCV: 98.9 fL (ref 80.0–100.0)
Monocytes Absolute: 0.8 10*3/uL (ref 0.1–1.0)
Monocytes Relative: 10 %
NEUTROS ABS: 4.4 10*3/uL (ref 1.7–7.7)
Neutrophils Relative %: 53 %
Platelet Count: 433 10*3/uL — ABNORMAL HIGH (ref 150–400)
RBC: 3.76 MIL/uL — ABNORMAL LOW (ref 4.22–5.81)
RDW: 12.6 % (ref 11.5–15.5)
WBC Count: 8.2 10*3/uL (ref 4.0–10.5)
nRBC: 0 % (ref 0.0–0.2)

## 2018-10-13 MED ORDER — PROCHLORPERAZINE MALEATE 10 MG PO TABS: 10.0000 mg | ORAL_TABLET | Freq: Four times a day (QID) | ORAL | 0 refills | Status: DC | PRN

## 2018-10-13 MED ORDER — DEXAMETHASONE 4 MG PO TABS: ORAL_TABLET | ORAL | 0 refills | Status: DC

## 2018-10-13 MED ORDER — CYANOCOBALAMIN 1000 MCG/ML IJ SOLN
1000.0000 ug | Freq: Once | INTRAMUSCULAR | Status: AC
  Administered 2018-10-13: 1000 ug via INTRAMUSCULAR

## 2018-10-13 MED ORDER — FOLIC ACID 1 MG PO TABS: 1.0000 mg | ORAL_TABLET | Freq: Every day | ORAL | 4 refills | Status: DC

## 2018-10-13 MED ORDER — CYANOCOBALAMIN 1000 MCG/ML IJ SOLN
INTRAMUSCULAR | Status: AC
  Filled 2018-10-13: qty 1

## 2018-10-13 NOTE — Progress Notes (Signed)
Oncology Nurse Navigator Documentation  Oncology Nurse Navigator Flowsheets 10/13/2018  Navigator Location [facility]  Referral date to RadOnc/MedOnc -  Navigator Encounter Type Clinic/MDC/spoke with patient today at clinic.  Patient is doing well and is here to have discussion with Dr. [name] on adjuvant treatment.  I also gave and explained information on lung cancer and his treatment plan.   Per Dr. [name] he would like his his tissue from 09/14/2018 to be sent for foundation one and PDL 1 testing.  I will request from pathology dept.   Telephone -  Abnormal Finding Date 07/08/2018  Confirmed Diagnosis Date 09/02/2018  Surgery Date 09/14/2018  Treatment Initiated Date 09/14/2018  Patient Visit Type MedOnc  Treatment Phase Other  Barriers/Navigation Needs Education  Education Understanding Cancer/ Treatment Options;Newly Diagnosed Cancer Education;Other;lung cancer education booklet;chemotherapy regimen;molecular testing   Interventions Education  Coordination of Care -  Education Method Verbal;Written  Acuity Level 1  Time Spent with Patient 18

## 2018-10-13 NOTE — Progress Notes (Signed)
[place] [phone]   [phone]  CONSULT NOTE  REFERRING PHYSICIAN: Dr. Modesto Charon.  REASON FOR CONSULTATION:  63 years old white male recently diagnosed with lung cancer.  HPI Sean Rose is a 63 y.o. male with past medical history significant for hypertension, dyslipidemia, COPD, GERD, peripheral arterial disease, coronary artery disease status post stent placement, pneumonia, GERD as well as long history of smoking.  The patient mentioned that in December 2019 he was complaining of increasing fatigue and weakness.  Because of his cardiac condition the patient went to the emergency department for evaluation he has cardiac work-up performed that was unremarkable chest x-ray on July 08, 2018 and that showed masslike opacity in the right lower lung zone with a pulmonary nodule and the right upper lobe highly suspicious for carcinoma and new since his prior examination.  This was followed by CT scan of the chest on July 08, 2018 and that showed no evidence for pulmonary embolism but there was a scattered right upper lobe pulmonary nodules, the most worrisome of which approximately measured 1.4 cm in diameter.  These findings are worrisome for synchronous bronchogenic carcinoma.  The patient had a PET scan on July 24, 2018 and it showed well ill-defined spiculated right upper lobe pulmonary nodules with intense hypermetabolic activity for size.  There was also intensity of FDG uptake and clustered distribution of these nodules.  There was no hypermetabolic thoracic lymphadenopathy or distant metastatic disease.  Repeat CT scan of the chest on August 20, 2018 showed persistent of the multiple aggressive appearing asymmetrically distributed pulmonary nodules in the lung bilaterally most evident in the right upper lobe.  The patient was seen by Dr. Valeta Harms and he underwent video bronchoscopy with electromagnetic navigational bronchoscopy on  09/02/2018.  The final pathology ([id number]) showed malignant cells consistent with non-small cell carcinoma. The patient was referred to [name] and on September 14, 2018 he underwent right VATS with right upper lobectomy and mediastinal lymph node dissection. The final pathology ([id number]) was consistent with poorly differentiated non-small cell carcinoma with separate tumor nodules within the same lobe.  There was evidence for lymphovascular invasion and spread through airspaces is present. There was also evidence of metastatic non-small cell carcinoma to level 13 lymph node.  His surgery was complicated with an air leak. [name] kindly referred the patient to me today for evaluation and recommendation regarding his adjuvant treatment. When seen today the patient is feeling fine except for fatigue and shortness of breath at baseline increased with exertion.  He continues to have some soreness in the right side of the chest as well as dry cough with no hemoptysis.  The patient lost around 12 pounds during his hospitalization but started gaining weight back.  He also complains of blurry vision but no headache. Family history significant for father who died at age 50 from old age and he has heart disease.  Mother is healthy and has a history of colon cancer and myocardial infarction. The patient is married and has 2 sons.  He used to work in [employer].  He has a history for smoking 2 pack/day for around 40 years and quit in February 2020.  He drinks alcohol occasion no history of drug abuse.   HPI  Past Medical History:  Diagnosis Date   Anginal pain ([place])    CAD (coronary artery disease)    CAP (community acquired pneumonia) 09/2016   COPD (chronic obstructive pulmonary disease) ([place])  GERD (gastroesophageal reflux disease)    Heart murmur    "I was told I had one when I was a kid"   Hemorrhoids    History of anal fissures    "no surgeries" (10/30/2016)    Hyperlipidemia    Hypertension    Peripheral arterial disease ([place])    status post right common iliac artery stenting back in 2007   Seasonal allergies    Tobacco abuse     Past Surgical History:  Procedure Laterality Date   ANKLE SURGERY Left    "rebuilt it"   ANTERIOR CRUCIATE LIGAMENT REPAIR Right    CARDIAC CATHETERIZATION  09/23/2008   Continued medical therapy - may need GI evaluation in addition.   CARDIAC CATHETERIZATION  10/28/2007   Medical therapy recommended.   CARDIAC CATHETERIZATION  11/18/2006   In-stent restenosis RCA  (50% distal edge, 80% segmental mid, and 50-60% segmental proximal). Successful cutting balloon atherectomy using a [id number] cutting balloon. 3 inflations with atherectomy performed on mid and proximal portions resulting in reduction of 80% mid in-stent restenosis to less than 20% residual and 50-60% segmental proximal to less than 20% residual without dissection.   CARDIAC CATHETERIZATION  02/26/2006   Severe stenosis in RCA. Stenting performed using IVUS. 3.5x20 Maverick balloon deployed at [employer]. Distal stent-a 4x28 Liberte stent-deployed 12atm 48sec, 12atm 31sec, 4atm 19sec. Mid stent-a 4x28 Liberte stent-deployed 14atm 45sec, 14atm 60sec, 14atm 44sec. Proximal stent-4x8 Liberte- 14atm 45sec,14atm 47sec, 16atm 43sec. Severely diseased segment then appeared TIMI-3 flow.   CARDIOVASCULAR STRESS TEST  11/17/2012   No significant ECG changes. Septal perfusion defect is new when complared to study from 2010. Abnormal myocardial perfusion imaging with a basal to mid perfusion suggestive of previous MI.   CAROTID DOPPLER  08/09/2011   Bilateral Bulb/Proximal ICA - demonstrated a mild amount of fibrous plaque without evidence of significant diameter reduction reduction or other vascular abnormality.   CHEST TUBE INSERTION Right 09/02/2018   Procedure: Chest Tube Insertion;  Surgeon: Garner Nash, DO;  Location: [facility];  Service: Thoracic;  Laterality:  Right;   COLONOSCOPY     2003, 2014   CORONARY ANGIOPLASTY WITH STENT PLACEMENT     FEMORAL ARTERY STENT     INGUINAL HERNIA REPAIR Right    KNEE ARTHROSCOPY Right "multiple"   LOWER EXTREMITY ARTERIAL DOPPLER  01/31/2011   Bilateral ABIs-normal values with no suggestion of arterial insuff to the lower extremities at rest. Right CIA stent-mild amount of nonhemodynamically significant plaque is noted throughout   [place]   "bone-eating tumor"   PERCUTANEOUS STENT INTERVENTION  04/04/2006 & 04/13/2015   a. Right common iliac artery with an 8.0x18 mm Herculink stent deployed at 12 atm. Stenosis was reduced from 80% to 0% with brisk flow. b. I-cast stenting to left common iliac artery   PERIPHERAL VASCULAR CATHETERIZATION N/A 04/13/2015   Procedure: Lower Extremity Angiography;  Surgeon: Lorretta Harp, MD; L-oCIA 75%, 40-50% L-EIA, R-CIA stent patent, s/p 8 mm x 38 mm ICast covered stent>>0% stenosis in [place] Right    TRANSTHORACIC ECHOCARDIOGRAM  11/26/2012   EF not noted. Aortic valve-sclerosis without stenosis, no regurgiation.    UPPER GASTROINTESTINAL ENDOSCOPY     US CAROTID DOPPLER BILATERAL ([place] [facility])  08/09/2011   Bilateral Bulb/Proximal ICAa demonstrated a mild amount of fibrous plaque without evidence of significant diameter reduction or any other vascular abnormality.   VIDEO ASSISTED THORACOSCOPY (VATS)/  LOBECTOMY Right 09/14/2018   Procedure: RIGHT VIDEO ASSISTED THORACOSCOPY (VATS)/ RIGHT UPPER LOBECTOMY;  Surgeon: Melrose Nakayama, MD;  Location: [facility];  Service: Thoracic;  Laterality: Right;   VIDEO BRONCHOSCOPY WITH ENDOBRONCHIAL NAVIGATION N/A 09/02/2018   Procedure: VIDEO BRONCHOSCOPY WITH ENDOBRONCHIAL NAVIGATION;  Surgeon: Garner Nash, DO;  Location: [facility];  Service: Thoracic;  Laterality: N/A;    Family History  Problem Relation Age of Onset   Colon cancer Mother    Heart disease  Father    Heart disease Paternal Grandfather     Social History Social History   Tobacco Use   Smoking status: Current Every Day Smoker    Packs/day: 0.25    Years: 38.00    Pack years: 9.50    Types: Cigarettes   Smokeless tobacco: Never Used   Tobacco comment: pack per day 12.17.19  Substance Use Topics   Alcohol use: Yes    Alcohol/week: 36.0 standard drinks    Types: 36 Cans of beer per week    Comment: 10/30/2016 "I usually drink 4-8 beers qd"   Drug use: No    Allergies  Allergen Reactions   Oxycodone-Acetaminophen Nausea Only   Tylox [Oxycodone-Acetaminophen] Nausea Only   Tyloxapol Nausea Only    Current Outpatient Medications  Medication Sig Dispense Refill   amLODipine (NORVASC) 5 MG tablet Take 1 tablet (5 mg total) by mouth daily. 30 tablet 1   aspirin EC 81 MG tablet Take 81 mg by mouth daily.     clopidogrel (PLAVIX) 75 MG tablet TAKE 1 TABLET BY MOUTH EVERY DAY 90 tablet 1   diphenhydrAMINE (BENADRYL) 25 mg capsule Take 25 mg by mouth daily as needed for allergies.      ibuprofen (ADVIL,MOTRIN) 200 MG tablet Take 400 mg by mouth every 6 (six) hours as needed for headache or moderate pain.      Magnesium 250 MG TABS Take 250 mg by mouth daily.      metoprolol succinate (TOPROL-XL) 100 MG 24 hr tablet TAKE 1 TABLET BY MOUTH EVERY DAY (Patient taking differently: Take 100 mg by mouth daily. ) 90 tablet 1   Multiple Vitamin (MULTIVITAMIN WITH MINERALS) TABS tablet Take 1 tablet by mouth daily.     nicotine (NICODERM CQ - DOSED IN MG/24 HOURS) 14 mg/24hr patch Place 1 patch (14 mg total) onto the skin daily. 28 patch 0   oxyCODONE (OXY IR/ROXICODONE) 5 MG immediate release tablet Take 1 tablet (5 mg total) by mouth every 4 (four) hours as needed for severe pain. 20 tablet 0   umeclidinium-vilanterol (ANORO ELLIPTA) 62.5-25 MCG/INH AEPB Inhale 1 puff into the lungs daily. 60 each 3   No current facility-administered medications for this visit.       Review of Systems  Constitutional: positive for fatigue and weight loss Eyes: negative Ears, nose, mouth, throat, and face: negative Respiratory: positive for cough, dyspnea on exertion and pleurisy/chest pain Cardiovascular: negative Gastrointestinal: negative Genitourinary:negative Integument/breast: negative Hematologic/lymphatic: negative Musculoskeletal:negative Neurological: negative Behavioral/Psych: negative Endocrine: negative Allergic/Immunologic: negative  Physical Exam  RUE:AVWUJ, healthy, no distress, well nourished and well developed SKIN: skin color, texture, turgor are normal, no rashes or significant lesions HEAD: Normocephalic, No masses, lesions, tenderness or abnormalities EYES: normal, PERRLA, Conjunctiva are pink and non-injected EARS: External ears normal, Canals clear OROPHARYNX:no exudate, no erythema and lips, buccal mucosa, and tongue normal  NECK: supple, no adenopathy, no JVD LYMPH:  no palpable lymphadenopathy, no hepatosplenomegaly LUNGS: clear to auscultation , and palpation HEART:  regular rate & rhythm, no murmurs and no gallops ABDOMEN:abdomen soft, non-tender, normal bowel sounds and no masses or organomegaly BACK: No CVA tenderness, Range of motion is normal EXTREMITIES:no joint deformities, effusion, or inflammation, no edema  NEURO: alert & oriented x 3 with fluent speech, no focal motor/sensory deficits  PERFORMANCE STATUS: ECOG 1  LABORATORY DATA: Lab Results  Component Value Date   WBC 8.2 10/13/2018   HGB 13.1 10/13/2018   HCT 37.2 (L) 10/13/2018   MCV 98.9 10/13/2018   PLT 433 (H) 10/13/2018      Chemistry      Component Value Date/Time   NA 124 (L) 09/15/2018 0228   NA 130 (L) 03/05/2017 1110   K 3.6 09/15/2018 0228   CL 93 (L) 09/15/2018 0228   CO2 22 09/15/2018 0228   BUN <5 (L) 09/15/2018 0228   BUN 4 (L) 03/05/2017 1110   CREATININE 0.56 (L) 09/15/2018 0228   CREATININE 0.58 (L) 05/22/2015 1041       Component Value Date/Time   CALCIUM 8.1 (L) 09/15/2018 0228   ALKPHOS 56 08/31/2018 1416   AST 24 08/31/2018 1416   ALT 15 08/31/2018 1416   BILITOT 0.3 08/31/2018 1416   BILITOT 0.6 03/05/2017 1110       RADIOGRAPHIC STUDIES: Dg Chest 2 View  Result Date: 10/06/2018 CLINICAL DATA:  History of post biopsy pneumothorax EXAM: CHEST - 2 VIEW COMPARISON:  10/05/2018 FINDINGS: Cardiac shadow is within normal limits. Right chest tube is noted in place. Postsurgical changes are again seen. Minimal right apical pneumothorax is again identified. No new focal abnormality is seen. IMPRESSION: Stable appearance of the chest when compared with the previous day. Electronically Signed   By: Inez Catalina M.D.   On: 10/06/2018 09:13   Dg Chest 2 View  Result Date: 10/05/2018 CLINICAL DATA:  Follow-up small right apical pneumothorax. EXAM: CHEST - 2 VIEW COMPARISON:  October 02, 2018 FINDINGS: The right chest tube remains in place. A tiny right apical pneumothorax persists but is smaller in the interval. The cardiomediastinal silhouette is stable. No other interval changes. IMPRESSION: The right chest tube remains stable. The small right apical pneumothorax persists but is smaller in the interval. No other changes. Electronically Signed   By: Dorise Bullion III M.D   On: 10/05/2018 13:27   Dg Chest 2 View  Result Date: 10/02/2018 CLINICAL DATA:  Status post VATS and right upper lobectomy EXAM: CHEST - 2 VIEW COMPARISON:  09/29/2018, 09/24/2018, 09/23/2018 FINDINGS: Right-sided chest tube remains in place with tip at the right peripheral apex. Postsurgical changes in the right upper lung. Persistent small right apical pneumothorax, slightly decreased in size. No focal airspace disease. Normal heart size with aortic atherosclerosis. IMPRESSION: Slight interval decrease in small right apical pneumothorax. Electronically Signed   By: Donavan Foil M.D.   On: 10/02/2018 15:12   Dg Chest 2 View  Result Date:  09/29/2018 CLINICAL DATA:  Patient status post right upper lobectomy 09/14/2018. EXAM: CHEST - 2 VIEW COMPARISON:  Single-view of the chest 09/24/2018 and 09/23/2018. FINDINGS: Right chest tube remains in place. Right pneumothorax seen on yesterday's examination has slightly increased in size and is estimated at 5-10%. Unchanged volume loss in the right chest consistent with surgery noted. The left lung is expanded and clear. Heart size is normal. Aortic atherosclerosis is seen. IMPRESSION: Slight increase in a right pneumothorax estimated at 5-10%. Right chest tube remains in place. Lungs are clear. Emphysema. Electronically Signed   By: Marcello Moores  Dalessio M.D.   On: 09/29/2018 09:47   Dg Chest 2 View  Result Date: 09/19/2018 CLINICAL DATA:  Status post RIGHT VATS and lobectomy EXAM: CHEST - 2 VIEW COMPARISON:  09/18/2018 and prior studies FINDINGS: RIGHT apical pneumothorax is unchanged, approximally 20%. A RIGHT thoracostomy tube is unchanged. Small amount of RIGHT subcutaneous emphysema is again identified. RIGHT lung surgical changes are again identified. No other changes identified. IMPRESSION: Unchanged appearance of the chest with RIGHT apical pneumothorax again noted, approximately 20%. Electronically Signed   By: Margarette Canada M.D.   On: 09/19/2018 08:23   Dg Chest 2v Repeat Same Day  Result Date: 10/06/2018 CLINICAL DATA:  Pneumothorax.  Status post chest tube removal. EXAM: CHEST - 2 VIEW SAME DAY COMPARISON:  10/06/2018 at 9:31 a.m. FINDINGS: Right apical pneumothorax is unchanged in size. Postsurgical changes of the upper right lung are again noted. The lungs are clear. Cardiomediastinal contours are normal. IMPRESSION: Unchanged size of right upper pneumothorax. Electronically Signed   By: Ulyses Jarred M.D.   On: 10/06/2018 14:46   Dg Chest 2v Repeat Same Day  Addendum Date: 10/06/2018   ADDENDUM REPORT: 10/06/2018 10:06 ADDENDUM: Findings called to [name]. Electronically Signed   By:  Dorise Bullion III M.D   On: 10/06/2018 10:06   Result Date: 10/06/2018 CLINICAL DATA:  Chest tube removal today. EXAM: CHEST - 2 VIEW SAME DAY COMPARISON:  October 06, 2018 FINDINGS: The right chest tube has been removed. There is a small to moderate right pneumothorax measuring 2 cm in thickness which is larger in the interval. No other interval changes. IMPRESSION: 1. The right chest tube is been removed. A small to moderate right-sided pneumothorax measuring 2 cm in thickness is larger in the interval. Findings are being called to the referring physician. Electronically Signed: By: Dorise Bullion III M.D On: 10/06/2018 09:54   Dg Chest Port 1 View  Result Date: 09/23/2018 CLINICAL DATA:  Follow-up examination for pneumothorax. EXAM: PORTABLE CHEST 1 VIEW COMPARISON:  Prior radiograph from earlier the same day. FINDINGS: Cardiac and mediastinal silhouettes are stable in size and contour, and remain within normal limits. Aortic atherosclerosis. Elevation of the right hemidiaphragm, stable. Large bore right-sided chest tube remains in place with tip overlying the lateral right upper lobe, unchanged. Adjacent suture line noted. Persistent small right apical pneumothorax relatively unchanged from previous. Lungs otherwise remain clear. No acute osseous finding. IMPRESSION: 1. Stable position of large bore right-sided thoracostomy tube. Small residual right apical pneumothorax unchanged from examination performed earlier the same day. 2. No other new active cardiopulmonary disease. Electronically Signed   By: Jeannine Boga M.D.   On: 09/23/2018 14:37   Dg Chest Port 1 View  Result Date: 09/23/2018 CLINICAL DATA:  Pneumothorax, chest tube.  Right upper lobectomy EXAM: PORTABLE CHEST 1 VIEW COMPARISON:  09/22/2018 FINDINGS: Right upper lobectomy. Right chest tube remains in place. Small right apical pneumothorax unchanged. Elevated right hemidiaphragm. No effusion. Left lung remains clear IMPRESSION:  Small right apical pneumothorax unchanged. Right lung otherwise clear. Electronically Signed   By: Franchot Gallo M.D.   On: 09/23/2018 11:22   Dg Chest Port 1 View  Result Date: 09/22/2018 CLINICAL DATA:  Pneumothorax. EXAM: PORTABLE CHEST 1 VIEW COMPARISON:  09/21/2018. FINDINGS: Right chest tube in stable position. Interim improvement right-sided pneumothorax with mild residual. Postsurgical changes right lung. No focal infiltrate. Heart size normal. No acute bony abnormality. IMPRESSION: 1. Right chest tube in stable position. Interim improvement of right-sided pneumothorax  with mild residual. 2.  Postsurgical changes right lung. Electronically Signed   By: Marcello Moores  Register   On: 09/22/2018 09:25   Dg Chest Port 1 View  Result Date: 09/21/2018 CLINICAL DATA:  Pneumothorax chest tube EXAM: PORTABLE CHEST 1 VIEW COMPARISON:  09/20/2018 FINDINGS: Right chest tube in place. Right apical pneumothorax unchanged approximately 15%. Elevated right hemidiaphragm no significant pleural effusion. Left lung remains clear. IMPRESSION: Right apical pneumothorax unchanged Electronically Signed   By: Franchot Gallo M.D.   On: 09/21/2018 09:08   Dg Chest Port 1 View  Result Date: 09/20/2018 CLINICAL DATA:  Patient status post video-assisted thoracoscopy 09/14/2018 for right upper lobectomy. EXAM: PORTABLE CHEST 1 VIEW COMPARISON:  Single-view of the chest 09/19/2018 FINDINGS: Right chest tube remains in place. Small right apical pneumothorax unchanged. Volume loss in the right chest consistent with lobectomy noted. Left lung is clear. Heart size is normal. Aortic atherosclerosis is seen. IMPRESSION: No change in a right pneumothorax with a chest tube in place. No new abnormality. Electronically Signed   By: Inge Rise M.D.   On: 09/20/2018 14:41   Dg Chest Port 1 View  Result Date: 09/19/2018 CLINICAL DATA:  Chest tube EXAM: PORTABLE CHEST 1 VIEW COMPARISON:  09/19/2018 FINDINGS: Right chest tube remains in  place, unchanged. Moderate-sized right pneumothorax is stable. Postoperative changes on the right. Left lung clear. Heart is normal size. No acute bony abnormality. IMPRESSION: Moderate-sized right pneumothorax, stable. Electronically Signed   By: Rolm Baptise M.D.   On: 09/19/2018 18:03   Dg Chest Port 1 View  Result Date: 09/18/2018 CLINICAL DATA:  Chest tube present.  Status post lobectomy. EXAM: PORTABLE CHEST 1 VIEW COMPARISON:  One-view chest x-ray 09/17/2018 FINDINGS: Heart size is normal.  Aortic atherosclerosis is present. Right-sided chest tube is in place. A right apical pneumothorax is now present. This encompasses 20% of the right hemithorax. There is no airspace disease. Subcutaneous emphysema is again seen. IMPRESSION: 1. Recurrent right apical pneumothorax, approximately 20%. 2. Right chest tube in place. Findings were called to the patient's nurse at the time of interpretation. Electronically Signed   By: San Morelle M.D.   On: 09/18/2018 07:22   Dg Chest Port 1 View  Result Date: 09/17/2018 CLINICAL DATA:  Status post lobectomy EXAM: PORTABLE CHEST 1 VIEW COMPARISON:  09/16/2018 FINDINGS: Right chest tube is stable. Right pneumothorax has resolved. Volume loss in the right lung is stable compatible with lobectomy. Scarring versus subsegmental atelectasis in the medial right lung. Left lung is clear. IMPRESSION: Right pneumothorax has resolved. Stable right chest tube and postoperative changes. Electronically Signed   By: Marybelle Killings M.D.   On: 09/17/2018 08:57   Dg Chest Port 1 View  Result Date: 09/16/2018 CLINICAL DATA:  Status post right lobectomy. EXAM: PORTABLE CHEST 1 VIEW COMPARISON:  Radiograph of September 15, 2018. FINDINGS: The heart size and mediastinal contours are within normal limits. Left lung is clear. Stable position of right-sided chest tube is noted with stable mild right apical pneumothorax. Mild subcutaneous emphysema is seen over right lateral chest wall.  Right internal jugular catheter is unchanged in position. The visualized skeletal structures are unremarkable. IMPRESSION: Stable position of right-sided chest tube with mild right apical pneumothorax. Mild subcutaneous emphysema is seen over right lateral chest wall. Electronically Signed   By: Marijo Conception, M.D.   On: 09/16/2018 09:53   Dg Chest Port 1 View  Result Date: 09/15/2018 CLINICAL DATA:  Respiratory failure EXAM: PORTABLE CHEST 1  VIEW COMPARISON:  Yesterday FINDINGS: Right IJ line with tip at the SVC. Chest tube on the right with trace apical pneumothorax that is stable. Stable chest wall emphysema. Lower lung volumes today. Postoperative distortion of the right hilum. Normal heart size. IMPRESSION: 1. Lower lung volumes compared yesterday. 2. Trace right apical pneumothorax. Electronically Signed   By: Monte Fantasia M.D.   On: 09/15/2018 08:19   Dg Chest Port 1 View  Result Date: 09/14/2018 CLINICAL DATA:  Partial lobectomy of the lung. EXAM: PORTABLE CHEST 1 VIEW COMPARISON:  09/13/2018 FINDINGS: New right sided chest tube is seen along the periphery of the right lung with chain sutures project over the right upper lung. Right IJ approach central line catheter terminates in the distal SVC. Slight volume loss with mediastinal shift to the right is identified. Elevation of the right hemidiaphragm is also more accentuated from volume loss. Soft tissue emphysema seen along the periphery of the right hemithorax. Left lung remains clear. Heart size is top normal with aortic atherosclerosis. Small right apical pneumothorax measuring 1 cm in thickness is identified. IMPRESSION: New right-sided chest tube is seen along the periphery of the right lung. There is a small right apical pneumothorax measuring 1 cm in thickness. Postop change and volume loss as above. Electronically Signed   By: Ashley Royalty M.D.   On: 09/14/2018 20:14    ASSESSMENT: This is a very pleasant 64 years old white male  recently diagnosed with a stage IIIa (T3, N1, M0) non-small cell lung cancer presented with multiple pulmonary nodules in the right upper lobe as well as metastatic disease in intraparenchymal lymphadenopathy.  This was diagnosed in February 2020.  He is status post right upper lobectomy with lymph node dissection.   PLAN: I had a lengthy discussion with the patient today about his current disease stage, prognosis and treatment options. I explained to the patient that there is a benefit for adjuvant systemic chemotherapy for patient with resected stage IIIa non-small cell lung cancer in the range of 10-15% in 5-year survival. I also gave the patient the option of continuous observation and close monitoring.  The patient is interested in proceeding with systemic chemotherapy and he will be treated with a regimen consisting of cisplatin 75 mg/M2 and Alimta 500 mg/M2 every 3 weeks for a total of 4 cycles.  I discussed with the patient the adverse effect of this treatment including but not limited to alopecia, myelosuppression, nausea and vomiting, peripheral neuropathy, liver or renal dysfunction. The patient will receive vitamin B12 injection today. I will arrange for the patient to have a chemotherapy education class before starting the first dose of his treatment in 2 weeks. I will call his pharmacy with prescription for Compazine 10 mg p.o. every 6 hours as needed for nausea in addition to folic acid 1 mg p.o. daily and Decadron 4 mg p.o. twice daily the day before, day of and day after chemotherapy. I will also complete the staging work-up by ordering MRI of the brain to rule out brain metastasis. The patient will come back for follow-up visit in 3 weeks for evaluation and management of any adverse effect of his treatment. He was advised to call immediately if he has any concerning symptoms in the interval.  The patient voices understanding of current disease status and treatment options and is in  agreement with the current care plan.  All questions were answered. The patient knows to call the clinic with any problems, questions or concerns. We  can certainly see the patient much sooner if necessary.  Thank you so much for allowing me to participate in the care of Sean Rose. I will continue to follow up the patient with you and assist in his care.  I spent 55 minutes counseling the patient face to face. The total time spent in the appointment was 80 minutes.  Disclaimer: This note was dictated with voice recognition software. Similar sounding words can inadvertently be transcribed and may not be corrected upon review.   Eilleen Kempf October 13, 2018, 3:02 PM

## 2018-10-13 NOTE — Progress Notes (Signed)
START ON PATHWAY REGIMEN - Non-Small Cell Lung     A cycle is every 21 days:     Pemetrexed      Cisplatin   **Always confirm dose/schedule in your pharmacy ordering system**  Patient Characteristics: Stage IIB - III - Resected (Adjuvant), No Prior Chemotherapy, Nonsquamous Cell AJCC T Category: T3 Current Disease Status: No Distant Mets or Local Recurrence AJCC N Category: N1 AJCC M Category: M0 AJCC 8 Stage Grouping: IIIA Histology: Nonsquamous Cell Intent of Therapy: Curative Intent, Discussed with Patient

## 2018-10-15 ENCOUNTER — Encounter (HOSPITAL_COMMUNITY): Payer: Self-pay | Admitting: Thoracic Surgery (Cardiothoracic Vascular Surgery)

## 2018-10-15 ENCOUNTER — Other Ambulatory Visit (HOSPITAL_COMMUNITY)
Admission: RE | Admit: 2018-10-15 | Discharge: 2018-10-15 | Disposition: A | Discharge status: Home / Self Care | Payer: BLUE CROSS/BLUE SHIELD | Source: Ambulatory Visit | Attending: Internal Medicine | Admitting: Internal Medicine

## 2018-10-15 DIAGNOSIS — C3491 Malignant neoplasm of unspecified part of right bronchus or lung: Secondary | ICD-10-CM | POA: Diagnosis not present

## 2018-10-16 LAB — ACID FAST CULTURE WITH REFLEXED SENSITIVITIES

## 2018-10-16 LAB — ACID FAST CULTURE WITH REFLEXED SENSITIVITIES (MYCOBACTERIA)
Acid Fast Culture: NEGATIVE
Acid Fast Culture: NEGATIVE

## 2018-10-19 ENCOUNTER — Other Ambulatory Visit: Payer: Self-pay | Admitting: Internal Medicine

## 2018-10-19 ENCOUNTER — Telehealth: Payer: Self-pay | Admitting: Medical Oncology

## 2018-10-19 DIAGNOSIS — C3490 Malignant neoplasm of unspecified part of unspecified bronchus or lung: Secondary | ICD-10-CM

## 2018-10-19 NOTE — Telephone Encounter (Signed)
Needs his chemo appts scheduled. MRI not ordered.

## 2018-10-19 NOTE — Telephone Encounter (Signed)
Ordered

## 2018-10-20 ENCOUNTER — Inpatient Hospital Stay: Payer: BLUE CROSS/BLUE SHIELD

## 2018-10-20 ENCOUNTER — Other Ambulatory Visit: Payer: Self-pay | Admitting: *Deleted

## 2018-10-20 ENCOUNTER — Other Ambulatory Visit: Payer: Self-pay

## 2018-10-20 DIAGNOSIS — Z902 Acquired absence of lung [part of]: Secondary | ICD-10-CM

## 2018-10-21 ENCOUNTER — Ambulatory Visit
Admission: RE | Admit: 2018-10-21 | Discharge: 2018-10-21 | Disposition: A | Discharge status: Home / Self Care | Payer: BLUE CROSS/BLUE SHIELD | Source: Ambulatory Visit | Attending: Thoracic Surgery (Cardiothoracic Vascular Surgery) | Admitting: Thoracic Surgery (Cardiothoracic Vascular Surgery)

## 2018-10-21 ENCOUNTER — Ambulatory Visit (INDEPENDENT_AMBULATORY_CARE_PROVIDER_SITE_OTHER): Payer: Self-pay | Admitting: Thoracic Surgery (Cardiothoracic Vascular Surgery)

## 2018-10-21 ENCOUNTER — Encounter: Payer: Self-pay | Admitting: Thoracic Surgery (Cardiothoracic Vascular Surgery)

## 2018-10-21 VITALS — BP 120/70 | HR 66 | Temp 97.6°F | Resp 20 | Ht 68.0 in | Wt 123.0 lb

## 2018-10-21 DIAGNOSIS — C3491 Malignant neoplasm of unspecified part of right bronchus or lung: Secondary | ICD-10-CM

## 2018-10-21 DIAGNOSIS — Z902 Acquired absence of lung [part of]: Secondary | ICD-10-CM

## 2018-10-21 NOTE — Progress Notes (Signed)
[address]       [place] [address]             [phone]     HPI: Mr. Couzens returns for a scheduled follow-up visit  Areg Bialas is a 63 year old gentleman with history of tobacco abuse and COPD.  He was found to have multiple right upper lobe lung nodules.  A bronchoscopic biopsy showed adenocarcinoma.  He had a pneumothorax post bronchoscopy.  He had a prolonged air leak.  Ultimately after about 10 days the air leak resolved, but he was already scheduled for surgery the following day.  Went ahead and did a right upper lobectomy on 09/14/2018.  He turned out to have T3, N1, stage IIIA non-small cell carcinoma.  He had a prolonged air leak postoperatively and went home with a chest tube in.  We were finally able to get his chest tube out on 10/06/2018.  Complains that he still feels a little short of breath.  He has some muscle soreness.  He is not taking any narcotics anymore.  Pain has improved since the chest tube was removed.  He starts chemotherapy next week.  Past Medical History:  Diagnosis Date  . Anginal pain ([facility])   . CAD (coronary artery disease)   . CAP (community acquired pneumonia) 09/2016  . COPD (chronic obstructive pulmonary disease) ([facility])   . GERD (gastroesophageal reflux disease)   . Heart murmur    "I was told I had one when I was a kid"  . Hemorrhoids   . History of anal fissures    "no surgeries" (10/30/2016)  . Hyperlipidemia   . Hypertension   . Peripheral arterial disease ([facility])    status post right common iliac artery stenting back in 2007  . Seasonal allergies   . Tobacco abuse     Current Outpatient Medications  Medication Sig Dispense Refill  . amLODipine (NORVASC) 5 MG tablet Take 1 tablet (5 mg total) by mouth daily. 30 tablet 1  . aspirin EC 81 MG tablet Take 81 mg by mouth daily.    . clopidogrel (PLAVIX) 75 MG tablet TAKE 1 TABLET BY MOUTH EVERY DAY 90 tablet 1  . dexamethasone (DECADRON) 4 MG tablet One tab po bid  the day before, day of and day after chemo every 3 weeks. 40 tablet 0  . diphenhydrAMINE (BENADRYL) 25 mg capsule Take 25 mg by mouth daily as needed for allergies.     . folic acid (FOLVITE) 1 MG tablet Take 1 tablet (1 mg total) by mouth daily. 30 tablet 4  . ibuprofen (ADVIL,MOTRIN) 200 MG tablet Take 400 mg by mouth every 6 (six) hours as needed for headache or moderate pain.     . Magnesium 250 MG TABS Take 250 mg by mouth daily.     . metoprolol succinate (TOPROL-XL) 100 MG 24 hr tablet TAKE 1 TABLET BY MOUTH EVERY DAY (Patient taking differently: Take 100 mg by mouth daily. ) 90 tablet 1  . Multiple Vitamin (MULTIVITAMIN WITH MINERALS) TABS tablet Take 1 tablet by mouth daily.    . nicotine (NICODERM CQ - DOSED IN MG/24 HOURS) 14 mg/24hr patch Place 1 patch (14 mg total) onto the skin daily. 28 patch 0  . oxyCODONE (OXY IR/ROXICODONE) 5 MG immediate release tablet Take 1 tablet (5 mg total) by mouth every 4 (four) hours as needed for severe pain. 20 tablet 0  . prochlorperazine (COMPAZINE) 10 MG tablet Take 1 tablet (  10 mg total) by mouth every 6 (six) hours as needed for nausea or vomiting. 30 tablet 0  . umeclidinium-vilanterol (ANORO ELLIPTA) 62.5-25 MCG/INH AEPB Inhale 1 puff into the lungs daily. 60 each 3   No current facility-administered medications for this visit.     Physical Exam BP 120/70   Pulse 66   Temp 97.6 F (36.4 C) (Oral)   Resp 20   Ht 5\' 8"  (1.727 m)   Wt 123 lb (55.8 kg)   SpO2 99% Comment: RA  BMI 18.36 kg/m  63 year old man in no acute distress Alert and oriented x3 with no focal deficits Lungs diminished at right base otherwise clear Cardiac regular rate and rhythm normal S1 and S2 Incision healing well.  Chest tube site with some minimal exudate, no erythema  Diagnostic Tests: CHEST - 2 VIEW  COMPARISON:  10/06/2018  FINDINGS: Cardiac shadow is stable. Left lung is clear. Right lung demonstrates postsurgical changes. The previously seen  pneumothorax has resolved in the interval. No new focal abnormality is seen. Degenerative change of the thoracic spine is noted.  IMPRESSION: Resolution of previously seen right-sided pneumothorax.   Electronically Signed   By: Inez Catalina M.D.   On: 10/21/2018 16:06 I personally reviewed the chest x-ray images and concur with the findings noted above  Impression: Mr. Kyler is a 63 year old gentleman with a history of tobacco abuse and COPD.  He was found to have multiple lung nodules.  He turned out to have a T3, N1, stage IIIA adenocarcinoma.  He had a thoracoscopic right upper lobectomy on 09/14/2018.  He had a prolonged air leak postoperatively and went home with a chest tube.  We finally got that out on 10/06/2018.  He had a small residual pneumo after chest tube removal.  That is gone on today's chest x-ray.  He does have some incisional soreness but is not requiring narcotics.  There are no restrictions on his activities, but he was advised to build into new activities gradually.  He may drive.  He is going to start chemotherapy next week.  Tobacco abuse-Smoking cessation instruction/counseling given:  commended patient for quitting and reviewed strategies for preventing relapses  Plan: Return in 51months to check on progress  Melrose Nakayama, MD [facility] [phone]

## 2018-10-22 ENCOUNTER — Telehealth: Payer: Self-pay | Admitting: Medical Oncology

## 2018-10-22 ENCOUNTER — Other Ambulatory Visit: Payer: Self-pay

## 2018-10-22 ENCOUNTER — Ambulatory Visit (HOSPITAL_COMMUNITY)
Admission: RE | Admit: 2018-10-22 | Discharge: 2018-10-22 | Disposition: A | Discharge status: Home / Self Care | Payer: BLUE CROSS/BLUE SHIELD | Source: Ambulatory Visit | Attending: Internal Medicine | Admitting: Internal Medicine

## 2018-10-22 DIAGNOSIS — C3490 Malignant neoplasm of unspecified part of unspecified bronchus or lung: Secondary | ICD-10-CM | POA: Diagnosis not present

## 2018-10-22 MED ORDER — GADOBUTROL 1 MMOL/ML IV SOLN
5.0000 mL | Freq: Once | INTRAVENOUS | Status: AC | PRN
  Administered 2018-10-22: 5 mL via INTRAVENOUS

## 2018-10-22 NOTE — Telephone Encounter (Signed)
Pt asking "I read the part about 5 year survival of 10-15% with systemic treatment" He wants to clarify this and wants to make sure that it is worth him doing the treatment.

## 2018-10-22 NOTE — Telephone Encounter (Signed)
Yes 10-15% increase (not total)  in 5 years survival. The decision to do treatment or not is up to him. Thank you.

## 2018-10-23 ENCOUNTER — Telehealth: Payer: Self-pay | Admitting: *Deleted

## 2018-10-27 ENCOUNTER — Other Ambulatory Visit: Payer: Self-pay

## 2018-10-27 ENCOUNTER — Inpatient Hospital Stay: Payer: BLUE CROSS/BLUE SHIELD

## 2018-10-27 VITALS — BP 121/77 | HR 63 | Temp 98.4°F | Resp 18

## 2018-10-27 DIAGNOSIS — C3491 Malignant neoplasm of unspecified part of right bronchus or lung: Secondary | ICD-10-CM

## 2018-10-27 DIAGNOSIS — C3411 Malignant neoplasm of upper lobe, right bronchus or lung: Secondary | ICD-10-CM | POA: Diagnosis not present

## 2018-10-27 DIAGNOSIS — Z5111 Encounter for antineoplastic chemotherapy: Secondary | ICD-10-CM | POA: Diagnosis not present

## 2018-10-27 DIAGNOSIS — C772 Secondary and unspecified malignant neoplasm of intra-abdominal lymph nodes: Secondary | ICD-10-CM | POA: Diagnosis not present

## 2018-10-27 LAB — CBC WITH DIFFERENTIAL (CANCER CENTER ONLY)
Abs Immature Granulocytes: 0.04 10*3/uL (ref 0.00–0.07)
Basophils Absolute: 0 10*3/uL (ref 0.0–0.1)
Basophils Relative: 0 %
Eosinophils Absolute: 0 10*3/uL (ref 0.0–0.5)
Eosinophils Relative: 0 %
HCT: 35.7 % — ABNORMAL LOW (ref 39.0–52.0)
Hemoglobin: 12.6 g/dL — ABNORMAL LOW (ref 13.0–17.0)
Immature Granulocytes: 0 %
Lymphocytes Relative: 21 %
Lymphs Abs: 1.9 10*3/uL (ref 0.7–4.0)
MCH: 34.1 pg — ABNORMAL HIGH (ref 26.0–34.0)
MCHC: 35.3 g/dL (ref 30.0–36.0)
MCV: 96.7 fL (ref 80.0–100.0)
Monocytes Absolute: 1 10*3/uL (ref 0.1–1.0)
Monocytes Relative: 11 %
Neutro Abs: 6.1 10*3/uL (ref 1.7–7.7)
Neutrophils Relative %: 68 %
Platelet Count: 341 10*3/uL (ref 150–400)
RBC: 3.69 MIL/uL — ABNORMAL LOW (ref 4.22–5.81)
RDW: 13.4 % (ref 11.5–15.5)
WBC Count: 8.9 10*3/uL (ref 4.0–10.5)
nRBC: 0 % (ref 0.0–0.2)

## 2018-10-27 LAB — CMP (CANCER CENTER ONLY)
ALT: 13 U/L (ref 0–44)
AST: 15 U/L (ref 15–41)
Albumin: 3.6 g/dL (ref 3.5–5.0)
Alkaline Phosphatase: 80 U/L (ref 38–126)
Anion gap: 11 (ref 5–15)
BUN: 5 mg/dL — ABNORMAL LOW (ref 8–23)
CO2: 23 mmol/L (ref 22–32)
Calcium: 8.8 mg/dL — ABNORMAL LOW (ref 8.9–10.3)
Chloride: 93 mmol/L — ABNORMAL LOW (ref 98–111)
Creatinine: 0.66 mg/dL (ref 0.61–1.24)
GFR, Est AFR Am: >60 mL/min (ref >60)
GFR, Estimated: >60 mL/min (ref >60)
Glucose, Bld: 131 mg/dL — ABNORMAL HIGH (ref 70–99)
POTASSIUM: 3.9 mmol/L (ref 3.5–5.1)
Sodium: 127 mmol/L — ABNORMAL LOW (ref 135–145)
Total Bilirubin: 0.5 mg/dL (ref 0.3–1.2)
Total Protein: 6.9 g/dL (ref 6.5–8.1)

## 2018-10-27 LAB — MAGNESIUM: Magnesium: 1.6 mg/dL — ABNORMAL LOW (ref 1.7–2.4)

## 2018-10-27 MED ORDER — PALONOSETRON HCL INJECTION 0.25 MG/5ML
0.2500 mg | Freq: Once | INTRAVENOUS | Status: AC
  Administered 2018-10-27: 0.25 mg via INTRAVENOUS

## 2018-10-27 MED ORDER — SODIUM CHLORIDE 0.9 % IV SOLN
Freq: Once | INTRAVENOUS | Status: AC
  Administered 2018-10-27: 09:00:00 via INTRAVENOUS
  Filled 2018-10-27: qty 250

## 2018-10-27 MED ORDER — SODIUM CHLORIDE 0.9 % IV SOLN
Freq: Once | INTRAVENOUS | Status: AC
  Administered 2018-10-27: 12:00:00 via INTRAVENOUS
  Filled 2018-10-27: qty 5

## 2018-10-27 MED ORDER — POTASSIUM CHLORIDE 2 MEQ/ML IV SOLN
Freq: Once | INTRAVENOUS | Status: AC
  Administered 2018-10-27: 09:00:00 via INTRAVENOUS
  Filled 2018-10-27: qty 10

## 2018-10-27 MED ORDER — SODIUM CHLORIDE 0.9 % IV SOLN
500.0000 mg/m2 | Freq: Once | INTRAVENOUS | Status: AC
  Administered 2018-10-27: 800 mg via INTRAVENOUS
  Filled 2018-10-27: qty 20

## 2018-10-27 MED ORDER — SODIUM CHLORIDE 0.9 % IV SOLN
75.0000 mg/m2 | Freq: Once | INTRAVENOUS | Status: AC
  Administered 2018-10-27: 122 mg via INTRAVENOUS
  Filled 2018-10-27: qty 122

## 2018-10-27 MED ORDER — PALONOSETRON HCL INJECTION 0.25 MG/5ML
INTRAVENOUS | Status: AC
  Filled 2018-10-27: qty 5

## 2018-10-27 NOTE — Patient Instructions (Signed)
[facility] Discharge Instructions for Patients Receiving Chemotherapy  Today you received the following chemotherapy agents alimta/cisplatin   To help prevent nausea and vomiting after your treatment, we encourage you to take your nausea medication as directed. If you develop nausea and vomiting that is not controlled by your nausea medication, call the clinic.   BELOW ARE SYMPTOMS THAT SHOULD BE REPORTED IMMEDIATELY:  *FEVER GREATER THAN 100.5 F  *CHILLS WITH OR WITHOUT FEVER  NAUSEA AND VOMITING THAT IS NOT CONTROLLED WITH YOUR NAUSEA MEDICATION  *UNUSUAL SHORTNESS OF BREATH  *UNUSUAL BRUISING OR BLEEDING  TENDERNESS IN MOUTH AND THROAT WITH OR WITHOUT PRESENCE OF ULCERS  *URINARY PROBLEMS  *BOWEL PROBLEMS  UNUSUAL RASH Items with * indicate a potential emergency and should be followed up as soon as possible.  Feel free to call the clinic you have any questions or concerns. The clinic phone number is [phone].

## 2018-11-03 ENCOUNTER — Encounter: Payer: Self-pay | Admitting: Internal Medicine

## 2018-11-03 ENCOUNTER — Other Ambulatory Visit: Payer: Self-pay

## 2018-11-03 ENCOUNTER — Inpatient Hospital Stay: Payer: BLUE CROSS/BLUE SHIELD

## 2018-11-03 ENCOUNTER — Inpatient Hospital Stay: Payer: BLUE CROSS/BLUE SHIELD | Attending: Internal Medicine | Admitting: Internal Medicine

## 2018-11-03 VITALS — BP 131/79 | HR 58 | Temp 97.9°F | Resp 18 | Ht 68.0 in | Wt 122.8 lb

## 2018-11-03 DIAGNOSIS — Z902 Acquired absence of lung [part of]: Secondary | ICD-10-CM

## 2018-11-03 DIAGNOSIS — Z79899 Other long term (current) drug therapy: Secondary | ICD-10-CM | POA: Insufficient documentation

## 2018-11-03 DIAGNOSIS — I10 Essential (primary) hypertension: Secondary | ICD-10-CM

## 2018-11-03 DIAGNOSIS — C772 Secondary and unspecified malignant neoplasm of intra-abdominal lymph nodes: Secondary | ICD-10-CM | POA: Insufficient documentation

## 2018-11-03 DIAGNOSIS — C3491 Malignant neoplasm of unspecified part of right bronchus or lung: Secondary | ICD-10-CM

## 2018-11-03 DIAGNOSIS — C3411 Malignant neoplasm of upper lobe, right bronchus or lung: Secondary | ICD-10-CM | POA: Diagnosis not present

## 2018-11-03 DIAGNOSIS — Z87891 Personal history of nicotine dependence: Secondary | ICD-10-CM | POA: Diagnosis not present

## 2018-11-03 DIAGNOSIS — Z5111 Encounter for antineoplastic chemotherapy: Secondary | ICD-10-CM | POA: Insufficient documentation

## 2018-11-03 LAB — CMP (CANCER CENTER ONLY)
ALT: 18 U/L (ref 0–44)
AST: 20 U/L (ref 15–41)
Albumin: 3.5 g/dL (ref 3.5–5.0)
Alkaline Phosphatase: 66 U/L (ref 38–126)
Anion gap: 8 (ref 5–15)
BUN: 7 mg/dL — ABNORMAL LOW (ref 8–23)
CO2: 27 mmol/L (ref 22–32)
Calcium: 8.7 mg/dL — ABNORMAL LOW (ref 8.9–10.3)
Chloride: 92 mmol/L — ABNORMAL LOW (ref 98–111)
Creatinine: 0.69 mg/dL (ref 0.61–1.24)
GFR, Est AFR Am: >60 mL/min (ref >60)
GFR, Estimated: >60 mL/min (ref >60)
Glucose, Bld: 97 mg/dL (ref 70–99)
Potassium: 4.4 mmol/L (ref 3.5–5.1)
Sodium: 127 mmol/L — ABNORMAL LOW (ref 135–145)
Total Bilirubin: 0.4 mg/dL (ref 0.3–1.2)
Total Protein: 6.5 g/dL (ref 6.5–8.1)

## 2018-11-03 LAB — CBC WITH DIFFERENTIAL (CANCER CENTER ONLY)
Abs Immature Granulocytes: 0.02 10*3/uL (ref 0.00–0.07)
Basophils Absolute: 0 10*3/uL (ref 0.0–0.1)
Basophils Relative: 0 %
Eosinophils Absolute: 0.2 10*3/uL (ref 0.0–0.5)
Eosinophils Relative: 3 %
HCT: 33.3 % — ABNORMAL LOW (ref 39.0–52.0)
Hemoglobin: 11.4 g/dL — ABNORMAL LOW (ref 13.0–17.0)
Immature Granulocytes: 0 %
Lymphocytes Relative: 38 %
Lymphs Abs: 2.1 10*3/uL (ref 0.7–4.0)
MCH: 34.3 pg — ABNORMAL HIGH (ref 26.0–34.0)
MCHC: 34.2 g/dL (ref 30.0–36.0)
MCV: 100.3 fL — ABNORMAL HIGH (ref 80.0–100.0)
Monocytes Absolute: 0.4 10*3/uL (ref 0.1–1.0)
Monocytes Relative: 7 %
Neutro Abs: 2.9 10*3/uL (ref 1.7–7.7)
Neutrophils Relative %: 52 %
Platelet Count: 257 10*3/uL (ref 150–400)
RBC: 3.32 MIL/uL — ABNORMAL LOW (ref 4.22–5.81)
RDW: 13.7 % (ref 11.5–15.5)
WBC Count: 5.6 10*3/uL (ref 4.0–10.5)
nRBC: 0 % (ref 0.0–0.2)

## 2018-11-03 LAB — MAGNESIUM: Magnesium: 1.5 mg/dL — ABNORMAL LOW (ref 1.7–2.4)

## 2018-11-03 NOTE — Progress Notes (Signed)
[facility] Telephone:([phone]   [phone]  OFFICE PROGRESS NOTE  [name], NP [place] [address]  DIAGNOSIS: Stage IIIa (T3, N1, M0) non-small cell lung cancer, adenocarcinoma with no actionable mutations presented with multiple pulmonary nodules in the right upper lobe as well as metastatic disease in intraparenchymal lymphadenopathy.  This was diagnosed in February 2020.  PRIOR THERAPY: Status post right upper lobectomy with lymph node dissection.  CURRENT THERAPY: Adjuvant systemic chemotherapy with cisplatin 75 mg/M2 and Alimta 500 mg/M2 every 3 weeks.  Status post 1 cycle.  INTERVAL HISTORY: Sean Rose 63 y.o. male returns to the clinic today for returns to the clinic today for follow-up visit.  The patient is feeling fine today with no concerning complaints.  He started the first cycle of his adjuvant systemic chemotherapy with cisplatin and Alimta last week.  He tolerated the first week of his treatment well except for fatigue.  He denied having any significant nausea or vomiting.  He has no fever or chills.  He denied having any numbness or tingling.  He has no headache or visual changes.  He had MRI of the brain performed last months and he is here for evaluation and discussion of his results.  MEDICAL HISTORY: Past Medical History:  Diagnosis Date   Anginal pain ([facility])    CAD (coronary artery disease)    CAP (community acquired pneumonia) 09/2016   COPD (chronic obstructive pulmonary disease) ([facility])    GERD (gastroesophageal reflux disease)    Heart murmur    "I was told I had one when I was a kid"   Hemorrhoids    History of anal fissures    "no surgeries" (10/30/2016)   Hyperlipidemia    Hypertension    Peripheral arterial disease ([place])    status post right common iliac artery stenting back in 2007   Seasonal allergies    Tobacco abuse     ALLERGIES:  is allergic to  oxycodone-acetaminophen; tylox [oxycodone-acetaminophen]; and tyloxapol.  MEDICATIONS:  Current Outpatient Medications  Medication Sig Dispense Refill   amLODipine (NORVASC) 5 MG tablet Take 1 tablet (5 mg total) by mouth daily. 30 tablet 1   aspirin EC 81 MG tablet Take 81 mg by mouth daily.     clopidogrel (PLAVIX) 75 MG tablet TAKE 1 TABLET BY MOUTH EVERY DAY 90 tablet 1   dexamethasone (DECADRON) 4 MG tablet One tab po bid the day before, day of and day after chemo every 3 weeks. 40 tablet 0   diphenhydrAMINE (BENADRYL) 25 mg capsule Take 25 mg by mouth daily as needed for allergies.      folic acid (FOLVITE) 1 MG tablet Take 1 tablet (1 mg total) by mouth daily. 30 tablet 4   ibuprofen (ADVIL,MOTRIN) 200 MG tablet Take 400 mg by mouth every 6 (six) hours as needed for headache or moderate pain.      Magnesium 250 MG TABS Take 250 mg by mouth daily.      metoprolol succinate (TOPROL-XL) 100 MG 24 hr tablet TAKE 1 TABLET BY MOUTH EVERY DAY (Patient taking differently: Take 100 mg by mouth daily. ) 90 tablet 1   Multiple Vitamin (MULTIVITAMIN WITH MINERALS) TABS tablet Take 1 tablet by mouth daily.     nicotine (NICODERM CQ - DOSED IN MG/24 HOURS) 14 mg/24hr patch Place 1 patch (14 mg total) onto the skin daily. 28 patch 0   oxyCODONE (OXY IR/ROXICODONE) 5 MG  immediate release tablet Take 1 tablet (5 mg total) by mouth every 4 (four) hours as needed for severe pain. 20 tablet 0   prochlorperazine (COMPAZINE) 10 MG tablet Take 1 tablet (10 mg total) by mouth every 6 (six) hours as needed for nausea or vomiting. 30 tablet 0   umeclidinium-vilanterol (ANORO ELLIPTA) 62.5-25 MCG/INH AEPB Inhale 1 puff into the lungs daily. 60 each 3   No current facility-administered medications for this visit.     SURGICAL HISTORY:  Past Surgical History:  Procedure Laterality Date   ANKLE SURGERY Left    "rebuilt it"   ANTERIOR CRUCIATE LIGAMENT REPAIR Right    CARDIAC CATHETERIZATION   09/23/2008   Continued medical therapy - may need GI evaluation in addition.   CARDIAC CATHETERIZATION  10/28/2007   Medical therapy recommended.   CARDIAC CATHETERIZATION  11/18/2006   In-stent restenosis RCA  (50% distal edge, 80% segmental mid, and 50-60% segmental proximal). Successful cutting balloon atherectomy using a [id number] cutting balloon. 3 inflations with atherectomy performed on mid and proximal portions resulting in reduction of 80% mid in-stent restenosis to less than 20% residual and 50-60% segmental proximal to less than 20% residual without dissection.   CARDIAC CATHETERIZATION  02/26/2006   Severe stenosis in RCA. Stenting performed using IVUS. 3.5x20 Maverick balloon deployed at [employer]. Distal stent-a 4x28 Liberte stent-deployed 12atm 48sec, 12atm 31sec, 4atm 19sec. Mid stent-a 4x28 Liberte stent-deployed 14atm 45sec, 14atm 60sec, 14atm 44sec. Proximal stent-4x8 Liberte- 14atm 45sec,14atm 47sec, 16atm 43sec. Severely diseased segment then appeared TIMI-3 flow.   CARDIOVASCULAR STRESS TEST  11/17/2012   No significant ECG changes. Septal perfusion defect is new when complared to study from 2010. Abnormal myocardial perfusion imaging with a basal to mid perfusion suggestive of previous MI.   CAROTID DOPPLER  08/09/2011   Bilateral Bulb/Proximal ICA - demonstrated a mild amount of fibrous plaque without evidence of significant diameter reduction reduction or other vascular abnormality.   CHEST TUBE INSERTION Right 09/02/2018   Procedure: Chest Tube Insertion;  Surgeon: Garner Nash, DO;  Location: [facility];  Service: Thoracic;  Laterality: Right;   COLONOSCOPY     2003, 2014   CORONARY ANGIOPLASTY WITH STENT PLACEMENT     FEMORAL ARTERY STENT     INGUINAL HERNIA REPAIR Right    KNEE ARTHROSCOPY Right "multiple"   LOWER EXTREMITY ARTERIAL DOPPLER  01/31/2011   Bilateral ABIs-normal values with no suggestion of arterial insuff to the lower extremities at rest. Right CIA  stent-mild amount of nonhemodynamically significant plaque is noted throughout   [place]   "bone-eating tumor"   PERCUTANEOUS STENT INTERVENTION  04/04/2006 & 04/13/2015   a. Right common iliac artery with an 8.0x18 mm Herculink stent deployed at 12 atm. Stenosis was reduced from 80% to 0% with brisk flow. b. I-cast stenting to left common iliac artery   PERIPHERAL VASCULAR CATHETERIZATION N/A 04/13/2015   Procedure: Lower Extremity Angiography;  Surgeon: Lorretta Harp, MD; L-oCIA 75%, 40-50% L-EIA, R-CIA stent patent, s/p 8 mm x 38 mm ICast covered stent>>0% stenosis in [place] Right    TRANSTHORACIC ECHOCARDIOGRAM  11/26/2012   EF not noted. Aortic valve-sclerosis without stenosis, no regurgiation.    UPPER GASTROINTESTINAL ENDOSCOPY     US CAROTID DOPPLER BILATERAL ([facility])  08/09/2011   Bilateral Bulb/Proximal ICAa demonstrated a mild amount of fibrous plaque without evidence of significant diameter reduction or any other vascular abnormality.  VIDEO ASSISTED THORACOSCOPY (VATS)/ LOBECTOMY Right 09/14/2018   Procedure: RIGHT VIDEO ASSISTED THORACOSCOPY (VATS)/ RIGHT UPPER LOBECTOMY;  Surgeon: Melrose Nakayama, MD;  Location: [facility];  Service: Thoracic;  Laterality: Right;   VIDEO BRONCHOSCOPY WITH ENDOBRONCHIAL NAVIGATION N/A 09/02/2018   Procedure: VIDEO BRONCHOSCOPY WITH ENDOBRONCHIAL NAVIGATION;  Surgeon: Garner Nash, DO;  Location: [facility];  Service: Thoracic;  Laterality: N/A;    REVIEW OF SYSTEMS:  A comprehensive review of systems was negative except for: Constitutional: positive for fatigue   PHYSICAL EXAMINATION: General appearance: alert, cooperative and no distress Head: Normocephalic, without obvious abnormality, atraumatic Neck: no adenopathy, no JVD, supple, symmetrical, trachea midline and thyroid not enlarged, symmetric, no tenderness/mass/nodules Lymph nodes: Cervical, supraclavicular, and  axillary nodes normal. Resp: clear to auscultation bilaterally Back: symmetric, no curvature. ROM normal. No CVA tenderness. Cardio: regular rate and rhythm, S1, S2 normal, no murmur, click, rub or gallop GI: soft, non-tender; bowel sounds normal; no masses,  no organomegaly Extremities: extremities normal, atraumatic, no cyanosis or edema  ECOG PERFORMANCE STATUS: 1 - Symptomatic but completely ambulatory  Blood pressure 131/79, pulse (!) 58, temperature 97.9 F (36.6 C), temperature source Oral, resp. rate 18, height '5\' 8"'  (1.727 m), weight 122 lb 12.8 oz (55.7 kg), SpO2 100 %.  LABORATORY DATA: Lab Results  Component Value Date   WBC 5.6 11/03/2018   HGB 11.4 (L) 11/03/2018   HCT 33.3 (L) 11/03/2018   MCV 100.3 (H) 11/03/2018   PLT 257 11/03/2018      Chemistry      Component Value Date/Time   NA 127 (L) 10/27/2018 0818   NA 130 (L) 03/05/2017 1110   K 3.9 10/27/2018 0818   CL 93 (L) 10/27/2018 0818   CO2 23 10/27/2018 0818   BUN 5 (L) 10/27/2018 0818   BUN 4 (L) 03/05/2017 1110   CREATININE 0.66 10/27/2018 0818   CREATININE 0.58 (L) 05/22/2015 1041      Component Value Date/Time   CALCIUM 8.8 (L) 10/27/2018 0818   ALKPHOS 80 10/27/2018 0818   AST 15 10/27/2018 0818   ALT 13 10/27/2018 0818   BILITOT 0.5 10/27/2018 0818       RADIOGRAPHIC STUDIES: Dg Chest 2 View  Result Date: 10/21/2018 CLINICAL DATA:  Status post right lobectomy. EXAM: CHEST - 2 VIEW COMPARISON:  10/06/2018 FINDINGS: Cardiac shadow is stable. Left lung is clear. Right lung demonstrates postsurgical changes. The previously seen pneumothorax has resolved in the interval. No new focal abnormality is seen. Degenerative change of the thoracic spine is noted. IMPRESSION: Resolution of previously seen right-sided pneumothorax. Electronically Signed   By: Inez Catalina M.D.   On: 10/21/2018 16:06   Dg Chest 2 View  Result Date: 10/06/2018 CLINICAL DATA:  History of post biopsy pneumothorax EXAM: CHEST  - 2 VIEW COMPARISON:  10/05/2018 FINDINGS: Cardiac shadow is within normal limits. Right chest tube is noted in place. Postsurgical changes are again seen. Minimal right apical pneumothorax is again identified. No new focal abnormality is seen. IMPRESSION: Stable appearance of the chest when compared with the previous day. Electronically Signed   By: Inez Catalina M.D.   On: 10/06/2018 09:13   Dg Chest 2 View  Result Date: 10/05/2018 CLINICAL DATA:  Follow-up small right apical pneumothorax. EXAM: CHEST - 2 VIEW COMPARISON:  October 02, 2018 FINDINGS: The right chest tube remains in place. A tiny right apical pneumothorax persists but is smaller in the interval. The cardiomediastinal silhouette is stable. No other interval changes.  IMPRESSION: The right chest tube remains stable. The small right apical pneumothorax persists but is smaller in the interval. No other changes. Electronically Signed   By: Dorise Bullion III M.D   On: 10/05/2018 13:27   Mr Brain W Wo Contrast  Result Date: 10/22/2018 CLINICAL DATA:  Non-small-cell lung cancer staging EXAM: MRI HEAD WITHOUT AND WITH CONTRAST TECHNIQUE: Multiplanar, multiecho pulse sequences of the brain and surrounding structures were obtained without and with intravenous contrast. CONTRAST:  5 mL Gadovist IV COMPARISON:  None. FINDINGS: Brain: Ventricle size normal. Scattered small nonenhancing white matter hyperintensities bilaterally consistent with chronic microvascular ischemia. Mild chronic ischemia right pons. No acute infarct or hemorrhage. Normal enhancement.  Negative for metastatic disease. Vascular: Normal arterial flow voids Skull and upper cervical spine: No suspicious calvarial lesions Approximately 4 mm anterolisthesis C3-4 with disc and facet degeneration and spinal stenosis. Limited evaluation of the canal and foramina. Sinuses/Orbits: Mild mucosal edema paranasal sinuses.  Normal orbit Other: None IMPRESSION: Negative for metastatic disease Mild  chronic microvascular ischemia Degenerative listhesis C3-4 with spinal and foraminal stenosis bilaterally Electronically Signed   By: Franchot Gallo M.D.   On: 10/22/2018 13:34   Dg Chest 2v Repeat Same Day  Result Date: 10/06/2018 CLINICAL DATA:  Pneumothorax.  Status post chest tube removal. EXAM: CHEST - 2 VIEW SAME DAY COMPARISON:  10/06/2018 at 9:31 a.m. FINDINGS: Right apical pneumothorax is unchanged in size. Postsurgical changes of the upper right lung are again noted. The lungs are clear. Cardiomediastinal contours are normal. IMPRESSION: Unchanged size of right upper pneumothorax. Electronically Signed   By: Ulyses Jarred M.D.   On: 10/06/2018 14:46   Dg Chest 2v Repeat Same Day  Addendum Date: 10/06/2018   ADDENDUM REPORT: 10/06/2018 10:06 ADDENDUM: Findings called to [name]. Electronically Signed   By: Dorise Bullion III M.D   On: 10/06/2018 10:06   Result Date: 10/06/2018 CLINICAL DATA:  Chest tube removal today. EXAM: CHEST - 2 VIEW SAME DAY COMPARISON:  October 06, 2018 FINDINGS: The right chest tube has been removed. There is a small to moderate right pneumothorax measuring 2 cm in thickness which is larger in the interval. No other interval changes. IMPRESSION: 1. The right chest tube is been removed. A small to moderate right-sided pneumothorax measuring 2 cm in thickness is larger in the interval. Findings are being called to the referring physician. Electronically Signed: By: Dorise Bullion III M.D On: 10/06/2018 09:54    ASSESSMENT AND PLAN: This is a very pleasant 63 years old white male with a stage IIIb non-small cell lung cancer, adenocarcinoma with no actionable mutations status post right upper lobectomy with lymph node dissection under the care of [name]. The patient is currently undergoing adjuvant treatment with cisplatin and Alimta status post 1 cycle.  He tolerated the first week of his treatment well with no concerning adverse effects except for mild  fatigue. I recommended for the patient to continue with his current treatment and he will come back for follow-up visit in 2 weeks for evaluation before starting cycle #2. I also discussed with the patient the results of the MRI of the brain that showed no evidence of metastatic disease to the brain. The patient was advised to call immediately if he has any concerning symptoms in the interval. The patient voices understanding of current disease status and treatment options and is in agreement with the current care plan.  All questions were answered. The patient knows to call the clinic  with any problems, questions or concerns. We can certainly see the patient much sooner if necessary.  I spent 10 minutes counseling the patient face to face. The total time spent in the appointment was 15 minutes.  Disclaimer: This note was dictated with voice recognition software. Similar sounding words can inadvertently be transcribed and may not be corrected upon review.

## 2018-11-04 ENCOUNTER — Telehealth: Payer: Self-pay | Admitting: Medical Oncology

## 2018-11-04 NOTE — Telephone Encounter (Signed)
Temp 98.9 no other symptoms. I reviewed CBC/diff from yesterday . I instructed him to monitor for now and to call for temp >100.5 or higher or any new symptoms.

## 2018-11-10 ENCOUNTER — Inpatient Hospital Stay: Payer: BLUE CROSS/BLUE SHIELD

## 2018-11-10 ENCOUNTER — Other Ambulatory Visit: Payer: Self-pay

## 2018-11-10 DIAGNOSIS — Z79899 Other long term (current) drug therapy: Secondary | ICD-10-CM | POA: Diagnosis not present

## 2018-11-10 DIAGNOSIS — Z902 Acquired absence of lung [part of]: Secondary | ICD-10-CM | POA: Diagnosis not present

## 2018-11-10 DIAGNOSIS — Z5111 Encounter for antineoplastic chemotherapy: Secondary | ICD-10-CM | POA: Diagnosis not present

## 2018-11-10 DIAGNOSIS — C3491 Malignant neoplasm of unspecified part of right bronchus or lung: Secondary | ICD-10-CM

## 2018-11-10 DIAGNOSIS — C3411 Malignant neoplasm of upper lobe, right bronchus or lung: Secondary | ICD-10-CM | POA: Diagnosis not present

## 2018-11-10 DIAGNOSIS — C772 Secondary and unspecified malignant neoplasm of intra-abdominal lymph nodes: Secondary | ICD-10-CM | POA: Diagnosis not present

## 2018-11-10 DIAGNOSIS — Z87891 Personal history of nicotine dependence: Secondary | ICD-10-CM | POA: Diagnosis not present

## 2018-11-10 LAB — CBC WITH DIFFERENTIAL (CANCER CENTER ONLY)
Abs Immature Granulocytes: 0.02 10*3/uL (ref 0.00–0.07)
Basophils Absolute: 0 10*3/uL (ref 0.0–0.1)
Basophils Relative: 0 %
Eosinophils Absolute: 0.2 10*3/uL (ref 0.0–0.5)
Eosinophils Relative: 3 %
HCT: 37.4 % — ABNORMAL LOW (ref 39.0–52.0)
Hemoglobin: 12.9 g/dL — ABNORMAL LOW (ref 13.0–17.0)
Immature Granulocytes: 0 %
Lymphocytes Relative: 38 %
Lymphs Abs: 2.3 10*3/uL (ref 0.7–4.0)
MCH: 34.9 pg — ABNORMAL HIGH (ref 26.0–34.0)
MCHC: 34.5 g/dL (ref 30.0–36.0)
MCV: 101.1 fL — ABNORMAL HIGH (ref 80.0–100.0)
Monocytes Absolute: 0.6 10*3/uL (ref 0.1–1.0)
Monocytes Relative: 10 %
Neutro Abs: 2.9 10*3/uL (ref 1.7–7.7)
Neutrophils Relative %: 49 %
Platelet Count: 352 10*3/uL (ref 150–400)
RBC: 3.7 MIL/uL — ABNORMAL LOW (ref 4.22–5.81)
RDW: 15 % (ref 11.5–15.5)
WBC Count: 5.9 10*3/uL (ref 4.0–10.5)
nRBC: 0 % (ref 0.0–0.2)

## 2018-11-10 LAB — CMP (CANCER CENTER ONLY)
ALT: 14 U/L (ref 0–44)
AST: 18 U/L (ref 15–41)
Albumin: 3.6 g/dL (ref 3.5–5.0)
Alkaline Phosphatase: 67 U/L (ref 38–126)
Anion gap: 11 (ref 5–15)
BUN: 6 mg/dL — ABNORMAL LOW (ref 8–23)
CO2: 25 mmol/L (ref 22–32)
Calcium: 8.9 mg/dL (ref 8.9–10.3)
Chloride: 98 mmol/L (ref 98–111)
Creatinine: 0.72 mg/dL (ref 0.61–1.24)
GFR, Est AFR Am: >60 mL/min (ref >60)
GFR, Estimated: >60 mL/min (ref >60)
Glucose, Bld: 114 mg/dL — ABNORMAL HIGH (ref 70–99)
Potassium: 4.2 mmol/L (ref 3.5–5.1)
Sodium: 134 mmol/L — ABNORMAL LOW (ref 135–145)
Total Bilirubin: 0.4 mg/dL (ref 0.3–1.2)
Total Protein: 7.1 g/dL (ref 6.5–8.1)

## 2018-11-10 LAB — MAGNESIUM: Magnesium: 1.6 mg/dL — ABNORMAL LOW (ref 1.7–2.4)

## 2018-11-17 ENCOUNTER — Other Ambulatory Visit: Payer: BLUE CROSS/BLUE SHIELD

## 2018-11-17 ENCOUNTER — Other Ambulatory Visit: Payer: Self-pay

## 2018-11-17 ENCOUNTER — Inpatient Hospital Stay: Payer: BLUE CROSS/BLUE SHIELD

## 2018-11-17 ENCOUNTER — Encounter: Payer: Self-pay | Admitting: Internal Medicine

## 2018-11-17 ENCOUNTER — Ambulatory Visit: Payer: BLUE CROSS/BLUE SHIELD | Admitting: Internal Medicine

## 2018-11-17 ENCOUNTER — Inpatient Hospital Stay (HOSPITAL_BASED_OUTPATIENT_CLINIC_OR_DEPARTMENT_OTHER): Payer: BLUE CROSS/BLUE SHIELD | Admitting: Internal Medicine

## 2018-11-17 VITALS — BP 126/79 | HR 66 | Temp 99.0°F | Resp 18 | Ht 68.0 in | Wt 126.7 lb

## 2018-11-17 DIAGNOSIS — C3491 Malignant neoplasm of unspecified part of right bronchus or lung: Secondary | ICD-10-CM

## 2018-11-17 DIAGNOSIS — C772 Secondary and unspecified malignant neoplasm of intra-abdominal lymph nodes: Secondary | ICD-10-CM | POA: Diagnosis not present

## 2018-11-17 DIAGNOSIS — C3411 Malignant neoplasm of upper lobe, right bronchus or lung: Secondary | ICD-10-CM | POA: Diagnosis not present

## 2018-11-17 DIAGNOSIS — Z5111 Encounter for antineoplastic chemotherapy: Secondary | ICD-10-CM

## 2018-11-17 DIAGNOSIS — Z87891 Personal history of nicotine dependence: Secondary | ICD-10-CM

## 2018-11-17 DIAGNOSIS — Z902 Acquired absence of lung [part of]: Secondary | ICD-10-CM | POA: Diagnosis not present

## 2018-11-17 DIAGNOSIS — Z79899 Other long term (current) drug therapy: Secondary | ICD-10-CM

## 2018-11-17 LAB — CBC WITH DIFFERENTIAL (CANCER CENTER ONLY)
Abs Immature Granulocytes: 0 10*3/uL (ref 0.00–0.07)
Basophils Absolute: 0 10*3/uL (ref 0.0–0.1)
Basophils Relative: 0 %
Eosinophils Absolute: 0 10*3/uL (ref 0.0–0.5)
Eosinophils Relative: 0 %
HCT: 37.2 % — ABNORMAL LOW (ref 39.0–52.0)
Hemoglobin: 12.7 g/dL — ABNORMAL LOW (ref 13.0–17.0)
Immature Granulocytes: 0 %
Lymphocytes Relative: 23 %
Lymphs Abs: 0.7 10*3/uL (ref 0.7–4.0)
MCH: 34.7 pg — ABNORMAL HIGH (ref 26.0–34.0)
MCHC: 34.1 g/dL (ref 30.0–36.0)
MCV: 101.6 fL — ABNORMAL HIGH (ref 80.0–100.0)
Monocytes Absolute: 0.1 10*3/uL (ref 0.1–1.0)
Monocytes Relative: 3 %
Neutro Abs: 2.1 10*3/uL (ref 1.7–7.7)
Neutrophils Relative %: 74 %
Platelet Count: 473 10*3/uL — ABNORMAL HIGH (ref 150–400)
RBC: 3.66 MIL/uL — ABNORMAL LOW (ref 4.22–5.81)
RDW: 15.6 % — ABNORMAL HIGH (ref 11.5–15.5)
WBC Count: 2.8 10*3/uL — ABNORMAL LOW (ref 4.0–10.5)
nRBC: 0 % (ref 0.0–0.2)

## 2018-11-17 LAB — CMP (CANCER CENTER ONLY)
ALT: 13 U/L (ref 0–44)
AST: 20 U/L (ref 15–41)
Albumin: 3.8 g/dL (ref 3.5–5.0)
Alkaline Phosphatase: 70 U/L (ref 38–126)
Anion gap: 11 (ref 5–15)
BUN: 5 mg/dL — ABNORMAL LOW (ref 8–23)
CO2: 23 mmol/L (ref 22–32)
Calcium: 9.1 mg/dL (ref 8.9–10.3)
Chloride: 98 mmol/L (ref 98–111)
Creatinine: 0.75 mg/dL (ref 0.61–1.24)
GFR, Est AFR Am: >60 mL/min (ref >60)
GFR, Estimated: >60 mL/min (ref >60)
Glucose, Bld: 141 mg/dL — ABNORMAL HIGH (ref 70–99)
Potassium: 5.2 mmol/L — ABNORMAL HIGH (ref 3.5–5.1)
Sodium: 132 mmol/L — ABNORMAL LOW (ref 135–145)
Total Bilirubin: 0.4 mg/dL (ref 0.3–1.2)
Total Protein: 7.1 g/dL (ref 6.5–8.1)

## 2018-11-17 LAB — MAGNESIUM: Magnesium: 1.5 mg/dL — ABNORMAL LOW (ref 1.7–2.4)

## 2018-11-17 NOTE — Progress Notes (Signed)
[place] [phone]   [phone]  OFFICE PROGRESS NOTE  [name], NP [place] [address]  DIAGNOSIS: Stage IIIa (T3, N1, M0) non-small cell lung cancer, adenocarcinoma with no actionable mutations presented with multiple pulmonary nodules in the right upper lobe as well as metastatic disease in intraparenchymal lymphadenopathy.  This was diagnosed in February 2020.  PRIOR THERAPY: Status post right upper lobectomy with lymph node dissection.  CURRENT THERAPY: Adjuvant systemic chemotherapy with cisplatin 75 mg/M2 and Alimta 500 mg/M2 every 3 weeks.  Status post 1 cycle.  INTERVAL HISTORY: Sean Rose 63 y.o. male returns to the clinic today for follow-up visit.  The patient is feeling fine today with no concerning complaints.  He tolerated the first cycle of his treatment with chemotherapy fairly well except for fatigue.  He denied having any chest pain, shortness of breath, cough or hemoptysis.  He denied having any weight loss or night sweats.  He has no nausea, vomiting, diarrhea or constipation.  He is here today for evaluation before starting cycle #2 tomorrow.   MEDICAL HISTORY: Past Medical History:  Diagnosis Date  . Anginal pain ([place])   . CAD (coronary artery disease)   . CAP (community acquired pneumonia) 09/2016  . COPD (chronic obstructive pulmonary disease) ([place])   . GERD (gastroesophageal reflux disease)   . Heart murmur    "I was told I had one when I was a kid"  . Hemorrhoids   . History of anal fissures    "no surgeries" (10/30/2016)  . Hyperlipidemia   . Hypertension   . Peripheral arterial disease ([place])    status post right common iliac artery stenting back in 2007  . Seasonal allergies   . Tobacco abuse     ALLERGIES:  is allergic to oxycodone-acetaminophen; tylox [oxycodone-acetaminophen]; and tyloxapol.  MEDICATIONS:  Current Outpatient Medications  Medication Sig Dispense  Refill  . amLODipine (NORVASC) 5 MG tablet Take 1 tablet (5 mg total) by mouth daily. 30 tablet 1  . aspirin EC 81 MG tablet Take 81 mg by mouth daily.    . clopidogrel (PLAVIX) 75 MG tablet TAKE 1 TABLET BY MOUTH EVERY DAY 90 tablet 1  . dexamethasone (DECADRON) 4 MG tablet One tab po bid the day before, day of and day after chemo every 3 weeks. 40 tablet 0  . diphenhydrAMINE (BENADRYL) 25 mg capsule Take 25 mg by mouth daily as needed for allergies.     . folic acid (FOLVITE) 1 MG tablet Take 1 tablet (1 mg total) by mouth daily. 30 tablet 4  . ibuprofen (ADVIL,MOTRIN) 200 MG tablet Take 400 mg by mouth every 6 (six) hours as needed for headache or moderate pain.     . Magnesium 250 MG TABS Take 250 mg by mouth daily.     . metoprolol succinate (TOPROL-XL) 100 MG 24 hr tablet TAKE 1 TABLET BY MOUTH EVERY DAY (Patient taking differently: Take 100 mg by mouth daily. ) 90 tablet 1  . Multiple Vitamin (MULTIVITAMIN WITH MINERALS) TABS tablet Take 1 tablet by mouth daily.    . nicotine (NICODERM CQ - DOSED IN MG/24 HOURS) 14 mg/24hr patch Place 1 patch (14 mg total) onto the skin daily. 28 patch 0  . oxyCODONE (OXY IR/ROXICODONE) 5 MG immediate release tablet Take 1 tablet (5 mg total) by mouth every 4 (four) hours as needed for severe pain. 20 tablet 0  . prochlorperazine (COMPAZINE) 10  MG tablet Take 1 tablet (10 mg total) by mouth every 6 (six) hours as needed for nausea or vomiting. 30 tablet 0  . umeclidinium-vilanterol (ANORO ELLIPTA) 62.5-25 MCG/INH AEPB Inhale 1 puff into the lungs daily. 60 each 3   No current facility-administered medications for this visit.     SURGICAL HISTORY:  Past Surgical History:  Procedure Laterality Date  . ANKLE SURGERY Left    "rebuilt it"  . ANTERIOR CRUCIATE LIGAMENT REPAIR Right   . CARDIAC CATHETERIZATION  09/23/2008   Continued medical therapy - may need GI evaluation in addition.  Marland Kitchen CARDIAC CATHETERIZATION  10/28/2007   Medical therapy recommended.   [facility] Kitchen CARDIAC CATHETERIZATION  11/18/2006   In-stent restenosis RCA  (50% distal edge, 80% segmental mid, and 50-60% segmental proximal). Successful cutting balloon atherectomy using a [id number] cutting balloon. 3 inflations with atherectomy performed on mid and proximal portions resulting in reduction of 80% mid in-stent restenosis to less than 20% residual and 50-60% segmental proximal to less than 20% residual without dissection.  Marland Kitchen CARDIAC CATHETERIZATION  02/26/2006   Severe stenosis in RCA. Stenting performed using IVUS. 3.5x20 Maverick balloon deployed at [employer]. Distal stent-a 4x28 Liberte stent-deployed 12atm 48sec, 12atm 31sec, 4atm 19sec. Mid stent-a 4x28 Liberte stent-deployed 14atm 45sec, 14atm 60sec, 14atm 44sec. Proximal stent-4x8 Liberte- 14atm 45sec,14atm 47sec, 16atm 43sec. Severely diseased segment then appeared TIMI-3 flow.  [facility] Kitchen CARDIOVASCULAR STRESS TEST  11/17/2012   No significant ECG changes. Septal perfusion defect is new when complared to study from 2010. Abnormal myocardial perfusion imaging with a basal to mid perfusion suggestive of previous MI.  Marland Kitchen CAROTID DOPPLER  08/09/2011   Bilateral Bulb/Proximal ICA - demonstrated a mild amount of fibrous plaque without evidence of significant diameter reduction reduction or other vascular abnormality.  . CHEST TUBE INSERTION Right 09/02/2018   Procedure: Chest Tube Insertion;  Surgeon: Garner Nash, DO;  Location: [facility];  Service: Thoracic;  Laterality: Right;  . COLONOSCOPY     2003, 2014  . CORONARY ANGIOPLASTY WITH STENT PLACEMENT    . FEMORAL ARTERY STENT    . INGUINAL HERNIA REPAIR Right   . KNEE ARTHROSCOPY Right "multiple"  . LOWER EXTREMITY ARTERIAL DOPPLER  01/31/2011   Bilateral ABIs-normal values with no suggestion of arterial insuff to the lower extremities at rest. Right CIA stent-mild amount of nonhemodynamically significant plaque is noted throughout  . 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 PERCUTANEOUS STENT  INTERVENTION  04/04/2006 & 04/13/2015   a. Right common iliac artery with an 8.0x18 mm Herculink stent deployed at 12 atm. Stenosis was reduced from 80% to 0% with brisk flow. b. I-cast stenting to left common iliac artery  . PERIPHERAL VASCULAR CATHETERIZATION N/A 04/13/2015   Procedure: Lower Extremity Angiography;  Surgeon: Lorretta Harp, MD; L-oCIA 75%, 40-50% L-EIA, R-CIA stent patent, s/p 8 mm x 38 mm ICast covered stent>>0% stenosis in L-oCIA     . SHOULDER ARTHROSCOPY WITH ROTATOR CUFF REPAIR Right   . TRANSTHORACIC ECHOCARDIOGRAM  11/26/2012   EF not noted. Aortic valve-sclerosis without stenosis, no regurgiation.   Marland Kitchen UPPER GASTROINTESTINAL ENDOSCOPY    . US CAROTID DOPPLER BILATERAL ([place] [facility])  08/09/2011   Bilateral Bulb/Proximal ICAa demonstrated a mild amount of fibrous plaque without evidence of significant diameter reduction or any other vascular abnormality.  [facility] Kitchen VIDEO ASSISTED THORACOSCOPY (VATS)/ LOBECTOMY Right 09/14/2018   Procedure: RIGHT VIDEO ASSISTED THORACOSCOPY (VATS)/ RIGHT UPPER LOBECTOMY;  Surgeon: Melrose Nakayama, MD;  Location:  [facility];  Service: Thoracic;  Laterality: Right;  [facility] Kitchen VIDEO BRONCHOSCOPY WITH ENDOBRONCHIAL NAVIGATION N/A 09/02/2018   Procedure: VIDEO BRONCHOSCOPY WITH ENDOBRONCHIAL NAVIGATION;  Surgeon: Garner Nash, DO;  Location: [facility] OR;  Service: Thoracic;  Laterality: N/A;    REVIEW OF SYSTEMS:  A comprehensive review of systems was negative except for: Constitutional: positive for fatigue   PHYSICAL EXAMINATION: General appearance: alert, cooperative and no distress Head: Normocephalic, without obvious abnormality, atraumatic Neck: no adenopathy, no JVD, supple, symmetrical, trachea midline and thyroid not enlarged, symmetric, no tenderness/mass/nodules Lymph nodes: Cervical, supraclavicular, and axillary nodes normal. Resp: clear to auscultation bilaterally Back: symmetric, no curvature. ROM normal. No CVA tenderness. Cardio: regular rate and  rhythm, S1, S2 normal, no murmur, click, rub or gallop GI: soft, non-tender; bowel sounds normal; no masses,  no organomegaly Extremities: extremities normal, atraumatic, no cyanosis or edema  ECOG PERFORMANCE STATUS: 1 - Symptomatic but completely ambulatory  Blood pressure 126/79, pulse 66, temperature 99 F (37.2 C), temperature source Oral, resp. rate 18, height _0  (1.727 m), weight 126 lb 11.2 oz (57.5 kg), SpO2 100 %.  LABORATORY DATA: Lab Results  Component Value Date   WBC 2.8 (L) 11/17/2018   HGB 12.7 (L) 11/17/2018   HCT 37.2 (L) 11/17/2018   MCV 101.6 (H) 11/17/2018   PLT 473 (H) 11/17/2018      Chemistry      Component Value Date/Time   NA 134 (L) 11/10/2018 1207   NA 130 (L) 03/05/2017 1110   K 4.2 11/10/2018 1207   CL 98 11/10/2018 1207   CO2 25 11/10/2018 1207   BUN 6 (L) 11/10/2018 1207   BUN 4 (L) 03/05/2017 1110   CREATININE 0.72 11/10/2018 1207   CREATININE 0.58 (L) 05/22/2015 1041      Component Value Date/Time   CALCIUM 8.9 11/10/2018 1207   ALKPHOS 67 11/10/2018 1207   AST 18 11/10/2018 1207   ALT 14 11/10/2018 1207   BILITOT 0.4 11/10/2018 1207       RADIOGRAPHIC STUDIES: Dg Chest 2 View  Result Date: 10/21/2018 CLINICAL DATA:  Status post right lobectomy. EXAM: CHEST - 2 VIEW COMPARISON:  10/06/2018 FINDINGS: Cardiac shadow is stable. Left lung is clear. Right lung demonstrates postsurgical changes. The previously seen pneumothorax has resolved in the interval. No new focal abnormality is seen. Degenerative change of the thoracic spine is noted. IMPRESSION: Resolution of previously seen right-sided pneumothorax. Electronically Signed   By: Inez Catalina M.D.   On: 10/21/2018 16:06   Mr Jeri Cos XH Contrast  Result Date: 10/22/2018 CLINICAL DATA:  Non-small-cell lung cancer staging EXAM: MRI HEAD WITHOUT AND WITH CONTRAST TECHNIQUE: Multiplanar, multiecho pulse sequences of the brain and surrounding structures were obtained without and with  intravenous contrast. CONTRAST:  5 mL Gadovist IV COMPARISON:  None. FINDINGS: Brain: Ventricle size normal. Scattered small nonenhancing white matter hyperintensities bilaterally consistent with chronic microvascular ischemia. Mild chronic ischemia right pons. No acute infarct or hemorrhage. Normal enhancement.  Negative for metastatic disease. Vascular: Normal arterial flow voids Skull and upper cervical spine: No suspicious calvarial lesions Approximately 4 mm anterolisthesis C3-4 with disc and facet degeneration and spinal stenosis. Limited evaluation of the canal and foramina. Sinuses/Orbits: Mild mucosal edema paranasal sinuses.  Normal orbit Other: None IMPRESSION: Negative for metastatic disease Mild chronic microvascular ischemia Degenerative listhesis C3-4 with spinal and foraminal stenosis bilaterally Electronically Signed   By: Franchot Gallo M.D.   On: 10/22/2018 13:34    ASSESSMENT AND  PLAN: This is a very pleasant 63 years old white male with a stage IIIb non-small cell lung cancer, adenocarcinoma with no actionable mutations status post right upper lobectomy with lymph node dissection under the care of [name]. The patient is currently undergoing adjuvant treatment with cisplatin and Alimta status post 1 cycle.   He tolerated the first cycle well except for fatigue. I recommended for him to proceed with cycle #2 tomorrow as a schedule.  I will continue to monitor his white blood count closely on the weekly lab. For the hypomagnesemia we will continue to monitor him closely and adjust his hydration fluids accordingly. The patient will come back for follow-up visit in 3 weeks for evaluation before starting cycle #3. He was advised to call immediately if he has any concerning symptoms The patient voices understanding of current disease status and treatment options and is in agreement with the current care plan. All questions were answered. The patient knows to call the clinic with any  problems, questions or concerns. We can certainly see the patient much sooner if necessary.  I spent 10 minutes counseling the patient face to face. The total time spent in the appointment was 15 minutes.  Disclaimer: This note was dictated with voice recognition software. Similar sounding words can inadvertently be transcribed and may not be corrected upon review.

## 2018-11-18 ENCOUNTER — Inpatient Hospital Stay: Payer: BLUE CROSS/BLUE SHIELD

## 2018-11-18 ENCOUNTER — Other Ambulatory Visit: Payer: Self-pay

## 2018-11-18 VITALS — BP 145/79 | HR 77 | Temp 98.4°F | Resp 18

## 2018-11-18 DIAGNOSIS — Z79899 Other long term (current) drug therapy: Secondary | ICD-10-CM | POA: Diagnosis not present

## 2018-11-18 DIAGNOSIS — Z5111 Encounter for antineoplastic chemotherapy: Secondary | ICD-10-CM | POA: Diagnosis not present

## 2018-11-18 DIAGNOSIS — C772 Secondary and unspecified malignant neoplasm of intra-abdominal lymph nodes: Secondary | ICD-10-CM | POA: Diagnosis not present

## 2018-11-18 DIAGNOSIS — C3411 Malignant neoplasm of upper lobe, right bronchus or lung: Secondary | ICD-10-CM | POA: Diagnosis not present

## 2018-11-18 DIAGNOSIS — C3491 Malignant neoplasm of unspecified part of right bronchus or lung: Secondary | ICD-10-CM

## 2018-11-18 DIAGNOSIS — Z87891 Personal history of nicotine dependence: Secondary | ICD-10-CM | POA: Diagnosis not present

## 2018-11-18 DIAGNOSIS — Z902 Acquired absence of lung [part of]: Secondary | ICD-10-CM | POA: Diagnosis not present

## 2018-11-18 MED ORDER — PALONOSETRON HCL INJECTION 0.25 MG/5ML
0.2500 mg | Freq: Once | INTRAVENOUS | Status: AC
  Administered 2018-11-18: 0.25 mg via INTRAVENOUS

## 2018-11-18 MED ORDER — SODIUM CHLORIDE 0.9 % IV SOLN
Freq: Once | INTRAVENOUS | Status: AC
  Administered 2018-11-18: 12:00:00 via INTRAVENOUS
  Filled 2018-11-18: qty 5

## 2018-11-18 MED ORDER — SODIUM CHLORIDE 0.9 % IV SOLN
500.0000 mg/m2 | Freq: Once | INTRAVENOUS | Status: AC
  Administered 2018-11-18: 800 mg via INTRAVENOUS
  Filled 2018-11-18: qty 20

## 2018-11-18 MED ORDER — PALONOSETRON HCL INJECTION 0.25 MG/5ML
INTRAVENOUS | Status: AC
  Filled 2018-11-18: qty 5

## 2018-11-18 MED ORDER — MANNITOL 25 % IV SOLN
Freq: Once | INTRAVENOUS | Status: AC
  Administered 2018-11-18: 09:00:00 via INTRAVENOUS
  Filled 2018-11-18: qty 1000

## 2018-11-18 MED ORDER — SODIUM CHLORIDE 0.9 % IV SOLN
75.0000 mg/m2 | Freq: Once | INTRAVENOUS | Status: AC
  Administered 2018-11-18: 122 mg via INTRAVENOUS
  Filled 2018-11-18: qty 122

## 2018-11-18 MED ORDER — SODIUM CHLORIDE 0.9 % IV SOLN
Freq: Once | INTRAVENOUS | Status: AC
  Administered 2018-11-18: 12:00:00 via INTRAVENOUS
  Filled 2018-11-18: qty 250

## 2018-11-18 NOTE — Progress Notes (Signed)
Per Dr. [name] - adjust cisplatin fluids to remove potassium and increase magnesium by 2 grams (total magnesium 3.5 grams) today.  Orders updated.   Demetrius Charity, PharmD, [facility] Pharmacist Pharmacy Phone: [phone] 11/18/2018

## 2018-11-18 NOTE — Patient Instructions (Signed)
[facility] Discharge Instructions for Patients Receiving Chemotherapy  Today you received the following chemotherapy agents alimta/cisplatin   To help prevent nausea and vomiting after your treatment, we encourage you to take your nausea medication as directed. BUT NO ZOFRAN FOR 3 DAYS AFTER CHEMO.   If you develop nausea and vomiting that is not controlled by your nausea medication, call the clinic.   BELOW ARE SYMPTOMS THAT SHOULD BE REPORTED IMMEDIATELY:  *FEVER GREATER THAN 100.5 F  *CHILLS WITH OR WITHOUT FEVER  NAUSEA AND VOMITING THAT IS NOT CONTROLLED WITH YOUR NAUSEA MEDICATION  *UNUSUAL SHORTNESS OF BREATH  *UNUSUAL BRUISING OR BLEEDING  TENDERNESS IN MOUTH AND THROAT WITH OR WITHOUT PRESENCE OF ULCERS  *URINARY PROBLEMS  *BOWEL PROBLEMS  UNUSUAL RASH Items with * indicate a potential emergency and should be followed up as soon as possible.  Feel free to call the clinic you have any questions or concerns. The clinic phone number is [phone].

## 2018-11-24 ENCOUNTER — Inpatient Hospital Stay: Payer: BLUE CROSS/BLUE SHIELD

## 2018-11-24 ENCOUNTER — Other Ambulatory Visit: Payer: Self-pay

## 2018-11-24 DIAGNOSIS — Z87891 Personal history of nicotine dependence: Secondary | ICD-10-CM | POA: Diagnosis not present

## 2018-11-24 DIAGNOSIS — C772 Secondary and unspecified malignant neoplasm of intra-abdominal lymph nodes: Secondary | ICD-10-CM | POA: Diagnosis not present

## 2018-11-24 DIAGNOSIS — C3411 Malignant neoplasm of upper lobe, right bronchus or lung: Secondary | ICD-10-CM | POA: Diagnosis not present

## 2018-11-24 DIAGNOSIS — Z79899 Other long term (current) drug therapy: Secondary | ICD-10-CM | POA: Diagnosis not present

## 2018-11-24 DIAGNOSIS — C3491 Malignant neoplasm of unspecified part of right bronchus or lung: Secondary | ICD-10-CM

## 2018-11-24 DIAGNOSIS — Z5111 Encounter for antineoplastic chemotherapy: Secondary | ICD-10-CM | POA: Diagnosis not present

## 2018-11-24 DIAGNOSIS — Z902 Acquired absence of lung [part of]: Secondary | ICD-10-CM | POA: Diagnosis not present

## 2018-11-24 LAB — CMP (CANCER CENTER ONLY)
ALT: 27 U/L (ref 0–44)
AST: 28 U/L (ref 15–41)
Albumin: 3.5 g/dL (ref 3.5–5.0)
Alkaline Phosphatase: 65 U/L (ref 38–126)
Anion gap: 8 (ref 5–15)
BUN: 9 mg/dL (ref 8–23)
CO2: 25 mmol/L (ref 22–32)
Calcium: 8.4 mg/dL — ABNORMAL LOW (ref 8.9–10.3)
Chloride: 95 mmol/L — ABNORMAL LOW (ref 98–111)
Creatinine: 0.75 mg/dL (ref 0.61–1.24)
GFR, Est AFR Am: >60 mL/min (ref >60)
GFR, Estimated: >60 mL/min (ref >60)
Glucose, Bld: 105 mg/dL — ABNORMAL HIGH (ref 70–99)
Potassium: 4.2 mmol/L (ref 3.5–5.1)
Sodium: 128 mmol/L — ABNORMAL LOW (ref 135–145)
Total Bilirubin: 0.3 mg/dL (ref 0.3–1.2)
Total Protein: 6.5 g/dL (ref 6.5–8.1)

## 2018-11-24 LAB — CBC WITH DIFFERENTIAL (CANCER CENTER ONLY)
Abs Immature Granulocytes: 0.01 10*3/uL (ref 0.00–0.07)
Basophils Absolute: 0 10*3/uL (ref 0.0–0.1)
Basophils Relative: 0 %
Eosinophils Absolute: 0.1 10*3/uL (ref 0.0–0.5)
Eosinophils Relative: 2 %
HCT: 35.8 % — ABNORMAL LOW (ref 39.0–52.0)
Hemoglobin: 12.6 g/dL — ABNORMAL LOW (ref 13.0–17.0)
Immature Granulocytes: 0 %
Lymphocytes Relative: 55 %
Lymphs Abs: 2.7 10*3/uL (ref 0.7–4.0)
MCH: 34.9 pg — ABNORMAL HIGH (ref 26.0–34.0)
MCHC: 35.2 g/dL (ref 30.0–36.0)
MCV: 99.2 fL (ref 80.0–100.0)
Monocytes Absolute: 0.3 10*3/uL (ref 0.1–1.0)
Monocytes Relative: 6 %
Neutro Abs: 1.8 10*3/uL (ref 1.7–7.7)
Neutrophils Relative %: 37 %
Platelet Count: 278 10*3/uL (ref 150–400)
RBC: 3.61 MIL/uL — ABNORMAL LOW (ref 4.22–5.81)
RDW: 14.8 % (ref 11.5–15.5)
WBC Count: 5 10*3/uL (ref 4.0–10.5)
nRBC: 0 % (ref 0.0–0.2)

## 2018-11-24 LAB — MAGNESIUM: Magnesium: 1.6 mg/dL — ABNORMAL LOW (ref 1.7–2.4)

## 2018-12-01 ENCOUNTER — Telehealth: Payer: Self-pay | Admitting: *Deleted

## 2018-12-01 ENCOUNTER — Other Ambulatory Visit: Payer: Self-pay

## 2018-12-01 ENCOUNTER — Inpatient Hospital Stay: Payer: BLUE CROSS/BLUE SHIELD | Attending: Internal Medicine

## 2018-12-01 DIAGNOSIS — C3491 Malignant neoplasm of unspecified part of right bronchus or lung: Secondary | ICD-10-CM

## 2018-12-01 DIAGNOSIS — C3411 Malignant neoplasm of upper lobe, right bronchus or lung: Secondary | ICD-10-CM | POA: Insufficient documentation

## 2018-12-01 LAB — CBC WITH DIFFERENTIAL (CANCER CENTER ONLY)
Abs Immature Granulocytes: 0.01 10*3/uL (ref 0.00–0.07)
Basophils Absolute: 0 10*3/uL (ref 0.0–0.1)
Basophils Relative: 0 %
Eosinophils Absolute: 0.1 10*3/uL (ref 0.0–0.5)
Eosinophils Relative: 2 %
HCT: 35.1 % — ABNORMAL LOW (ref 39.0–52.0)
Hemoglobin: 12 g/dL — ABNORMAL LOW (ref 13.0–17.0)
Immature Granulocytes: 0 %
Lymphocytes Relative: 44 %
Lymphs Abs: 2.4 10*3/uL (ref 0.7–4.0)
MCH: 34.6 pg — ABNORMAL HIGH (ref 26.0–34.0)
MCHC: 34.2 g/dL (ref 30.0–36.0)
MCV: 101.2 fL — ABNORMAL HIGH (ref 80.0–100.0)
Monocytes Absolute: 0.6 10*3/uL (ref 0.1–1.0)
Monocytes Relative: 11 %
Neutro Abs: 2.3 10*3/uL (ref 1.7–7.7)
Neutrophils Relative %: 43 %
Platelet Count: 193 10*3/uL (ref 150–400)
RBC: 3.47 MIL/uL — ABNORMAL LOW (ref 4.22–5.81)
RDW: 15.5 % (ref 11.5–15.5)
WBC Count: 5.4 10*3/uL (ref 4.0–10.5)
nRBC: 0 % (ref 0.0–0.2)

## 2018-12-01 LAB — CMP (CANCER CENTER ONLY)
ALT: 15 U/L (ref 0–44)
AST: 23 U/L (ref 15–41)
Albumin: 3.5 g/dL (ref 3.5–5.0)
Alkaline Phosphatase: 61 U/L (ref 38–126)
Anion gap: 9 (ref 5–15)
BUN: 5 mg/dL — ABNORMAL LOW (ref 8–23)
CO2: 26 mmol/L (ref 22–32)
Calcium: 8.5 mg/dL — ABNORMAL LOW (ref 8.9–10.3)
Chloride: 99 mmol/L (ref 98–111)
Creatinine: 0.71 mg/dL (ref 0.61–1.24)
GFR, Est AFR Am: >60 mL/min (ref >60)
GFR, Estimated: >60 mL/min (ref >60)
Glucose, Bld: 100 mg/dL — ABNORMAL HIGH (ref 70–99)
Potassium: 4.2 mmol/L (ref 3.5–5.1)
Sodium: 134 mmol/L — ABNORMAL LOW (ref 135–145)
Total Bilirubin: <0.2 mg/dL — ABNORMAL LOW (ref 0.3–1.2)
Total Protein: 6.7 g/dL (ref 6.5–8.1)

## 2018-12-01 LAB — MAGNESIUM: Magnesium: 1.2 mg/dL — CL (ref 1.7–2.4)

## 2018-12-01 NOTE — Telephone Encounter (Signed)
Low magnesium . ILVM for pt to increase  his mag 250 mg to QID and to call me if he does not have any magnesium tablets.

## 2018-12-01 NOTE — Telephone Encounter (Signed)
Received call report from [facility]  "Today's Mg = 1.2."  Collaborative sent secure message with results.

## 2018-12-07 ENCOUNTER — Inpatient Hospital Stay: Admit: 2018-12-07 | Payer: BLUE CROSS/BLUE SHIELD | Admitting: Surgery

## 2018-12-07 ENCOUNTER — Emergency Department (HOSPITAL_COMMUNITY): Payer: BLUE CROSS/BLUE SHIELD

## 2018-12-07 ENCOUNTER — Encounter (HOSPITAL_COMMUNITY): Admission: EM | Disposition: A | Discharge status: Home / Self Care | Payer: Self-pay | Source: Home / Self Care

## 2018-12-07 ENCOUNTER — Inpatient Hospital Stay (HOSPITAL_BASED_OUTPATIENT_CLINIC_OR_DEPARTMENT_OTHER): Payer: BLUE CROSS/BLUE SHIELD | Admitting: Internal Medicine

## 2018-12-07 ENCOUNTER — Other Ambulatory Visit: Payer: Self-pay

## 2018-12-07 ENCOUNTER — Encounter (HOSPITAL_COMMUNITY): Payer: Self-pay | Admitting: Emergency Medicine

## 2018-12-07 ENCOUNTER — Inpatient Hospital Stay: Payer: BLUE CROSS/BLUE SHIELD

## 2018-12-07 ENCOUNTER — Encounter: Payer: Self-pay | Admitting: Internal Medicine

## 2018-12-07 ENCOUNTER — Inpatient Hospital Stay (HOSPITAL_COMMUNITY)
Admission: EM | Admit: 2018-12-07 | Discharge: 2018-12-08 | DRG: 342 | Disposition: A | Discharge status: Home / Self Care | Payer: BLUE CROSS/BLUE SHIELD | Attending: Surgery | Admitting: Surgery

## 2018-12-07 ENCOUNTER — Inpatient Hospital Stay (HOSPITAL_COMMUNITY): Payer: BLUE CROSS/BLUE SHIELD | Admitting: Anesthesiology

## 2018-12-07 VITALS — BP 140/85 | HR 63 | Temp 98.3°F | Resp 20 | Ht 68.0 in | Wt 127.6 lb

## 2018-12-07 DIAGNOSIS — C3411 Malignant neoplasm of upper lobe, right bronchus or lung: Secondary | ICD-10-CM | POA: Diagnosis not present

## 2018-12-07 DIAGNOSIS — K37 Unspecified appendicitis: Secondary | ICD-10-CM | POA: Diagnosis present

## 2018-12-07 DIAGNOSIS — R1031 Right lower quadrant pain: Secondary | ICD-10-CM | POA: Diagnosis not present

## 2018-12-07 DIAGNOSIS — Z902 Acquired absence of lung [part of]: Secondary | ICD-10-CM | POA: Diagnosis not present

## 2018-12-07 DIAGNOSIS — E878 Other disorders of electrolyte and fluid balance, not elsewhere classified: Secondary | ICD-10-CM | POA: Diagnosis present

## 2018-12-07 DIAGNOSIS — I2510 Atherosclerotic heart disease of native coronary artery without angina pectoris: Secondary | ICD-10-CM | POA: Diagnosis not present

## 2018-12-07 DIAGNOSIS — J449 Chronic obstructive pulmonary disease, unspecified: Secondary | ICD-10-CM | POA: Diagnosis not present

## 2018-12-07 DIAGNOSIS — Z79899 Other long term (current) drug therapy: Secondary | ICD-10-CM

## 2018-12-07 DIAGNOSIS — F17210 Nicotine dependence, cigarettes, uncomplicated: Secondary | ICD-10-CM | POA: Diagnosis present

## 2018-12-07 DIAGNOSIS — K219 Gastro-esophageal reflux disease without esophagitis: Secondary | ICD-10-CM | POA: Diagnosis not present

## 2018-12-07 DIAGNOSIS — Z1159 Encounter for screening for other viral diseases: Secondary | ICD-10-CM | POA: Diagnosis not present

## 2018-12-07 DIAGNOSIS — C3490 Malignant neoplasm of unspecified part of unspecified bronchus or lung: Secondary | ICD-10-CM | POA: Diagnosis not present

## 2018-12-07 DIAGNOSIS — I10 Essential (primary) hypertension: Secondary | ICD-10-CM | POA: Diagnosis not present

## 2018-12-07 DIAGNOSIS — I739 Peripheral vascular disease, unspecified: Secondary | ICD-10-CM | POA: Diagnosis not present

## 2018-12-07 DIAGNOSIS — Z7902 Long term (current) use of antithrombotics/antiplatelets: Secondary | ICD-10-CM

## 2018-12-07 DIAGNOSIS — E871 Hypo-osmolality and hyponatremia: Secondary | ICD-10-CM | POA: Diagnosis present

## 2018-12-07 DIAGNOSIS — Z8249 Family history of ischemic heart disease and other diseases of the circulatory system: Secondary | ICD-10-CM

## 2018-12-07 DIAGNOSIS — Z5111 Encounter for antineoplastic chemotherapy: Secondary | ICD-10-CM

## 2018-12-07 DIAGNOSIS — K3580 Unspecified acute appendicitis: Secondary | ICD-10-CM | POA: Diagnosis not present

## 2018-12-07 DIAGNOSIS — E785 Hyperlipidemia, unspecified: Secondary | ICD-10-CM | POA: Diagnosis present

## 2018-12-07 DIAGNOSIS — Z7982 Long term (current) use of aspirin: Secondary | ICD-10-CM | POA: Diagnosis not present

## 2018-12-07 DIAGNOSIS — C3491 Malignant neoplasm of unspecified part of right bronchus or lung: Secondary | ICD-10-CM

## 2018-12-07 DIAGNOSIS — I25119 Atherosclerotic heart disease of native coronary artery with unspecified angina pectoris: Secondary | ICD-10-CM | POA: Diagnosis not present

## 2018-12-07 DIAGNOSIS — Z03818 Encounter for observation for suspected exposure to other biological agents ruled out: Secondary | ICD-10-CM | POA: Diagnosis not present

## 2018-12-07 DIAGNOSIS — Z888 Allergy status to other drugs, medicaments and biological substances status: Secondary | ICD-10-CM

## 2018-12-07 DIAGNOSIS — R109 Unspecified abdominal pain: Secondary | ICD-10-CM | POA: Insufficient documentation

## 2018-12-07 HISTORY — DX: Unspecified appendicitis: K37

## 2018-12-07 HISTORY — PX: LAPAROSCOPIC APPENDECTOMY: SHX408

## 2018-12-07 LAB — URINALYSIS, ROUTINE W REFLEX MICROSCOPIC
Bilirubin Urine: NEGATIVE
Glucose, UA: NEGATIVE mg/dL
Hgb urine dipstick: NEGATIVE
Ketones, ur: NEGATIVE mg/dL
Leukocytes,Ua: NEGATIVE
Nitrite: NEGATIVE
Protein, ur: NEGATIVE mg/dL
Specific Gravity, Urine: 1.013 (ref 1.005–1.030)
pH: 7 (ref 5.0–8.0)

## 2018-12-07 LAB — CBC WITH DIFFERENTIAL (CANCER CENTER ONLY)
Abs Immature Granulocytes: 0.02 10*3/uL (ref 0.00–0.07)
Basophils Absolute: 0 10*3/uL (ref 0.0–0.1)
Basophils Relative: 0 %
Eosinophils Absolute: 0 10*3/uL (ref 0.0–0.5)
Eosinophils Relative: 0 %
HCT: 38 % — ABNORMAL LOW (ref 39.0–52.0)
Hemoglobin: 13.1 g/dL (ref 13.0–17.0)
Immature Granulocytes: 1 %
Lymphocytes Relative: 14 %
Lymphs Abs: 0.6 10*3/uL — ABNORMAL LOW (ref 0.7–4.0)
MCH: 34.7 pg — ABNORMAL HIGH (ref 26.0–34.0)
MCHC: 34.5 g/dL (ref 30.0–36.0)
MCV: 100.8 fL — ABNORMAL HIGH (ref 80.0–100.0)
Monocytes Absolute: 0.1 10*3/uL (ref 0.1–1.0)
Monocytes Relative: 3 %
Neutro Abs: 3.4 10*3/uL (ref 1.7–7.7)
Neutrophils Relative %: 82 %
Platelet Count: 345 10*3/uL (ref 150–400)
RBC: 3.77 MIL/uL — ABNORMAL LOW (ref 4.22–5.81)
RDW: 15.9 % — ABNORMAL HIGH (ref 11.5–15.5)
WBC Count: 4.1 10*3/uL (ref 4.0–10.5)
nRBC: 0 % (ref 0.0–0.2)

## 2018-12-07 LAB — CMP (CANCER CENTER ONLY)
ALT: 14 U/L (ref 0–44)
AST: 19 U/L (ref 15–41)
Albumin: 3.8 g/dL (ref 3.5–5.0)
Alkaline Phosphatase: 81 U/L (ref 38–126)
Anion gap: 10 (ref 5–15)
BUN: 8 mg/dL (ref 8–23)
CO2: 25 mmol/L (ref 22–32)
Calcium: 8.9 mg/dL (ref 8.9–10.3)
Chloride: 95 mmol/L — ABNORMAL LOW (ref 98–111)
Creatinine: 0.81 mg/dL (ref 0.61–1.24)
GFR, Est AFR Am: >60 mL/min (ref >60)
GFR, Estimated: >60 mL/min (ref >60)
Glucose, Bld: 154 mg/dL — ABNORMAL HIGH (ref 70–99)
Potassium: 4 mmol/L (ref 3.5–5.1)
Sodium: 130 mmol/L — ABNORMAL LOW (ref 135–145)
Total Bilirubin: 0.6 mg/dL (ref 0.3–1.2)
Total Protein: 7.3 g/dL (ref 6.5–8.1)

## 2018-12-07 LAB — MAGNESIUM: Magnesium: 1.6 mg/dL — ABNORMAL LOW (ref 1.7–2.4)

## 2018-12-07 LAB — SARS CORONAVIRUS 2 BY RT PCR (HOSPITAL ORDER, PERFORMED IN ~~LOC~~ HOSPITAL LAB): SARS Coronavirus 2: NEGATIVE

## 2018-12-07 SURGERY — APPENDECTOMY, LAPAROSCOPIC
Anesthesia: General

## 2018-12-07 MED ORDER — OXYCODONE HCL 5 MG PO TABS
5.0000 mg | ORAL_TABLET | ORAL | Status: DC | PRN
  Administered 2018-12-07: 5 mg via ORAL
  Filled 2018-12-07: qty 1

## 2018-12-07 MED ORDER — LACTATED RINGERS IR SOLN
Status: DC | PRN
  Administered 2018-12-07: 1000 mL

## 2018-12-07 MED ORDER — DIPHENHYDRAMINE HCL 50 MG/ML IJ SOLN: 12.5000 mg | Freq: Four times a day (QID) | INTRAMUSCULAR | Status: DC | PRN

## 2018-12-07 MED ORDER — PIPERACILLIN-TAZOBACTAM 3.375 G IVPB
3.3750 g | Freq: Three times a day (TID) | INTRAVENOUS | Status: DC
  Administered 2018-12-07: 23:00:00 3.375 g via INTRAVENOUS
  Filled 2018-12-07 (×2): qty 50

## 2018-12-07 MED ORDER — SUCCINYLCHOLINE CHLORIDE 200 MG/10ML IV SOSY
PREFILLED_SYRINGE | INTRAVENOUS | Status: DC | PRN
  Administered 2018-12-07: 80 mg via INTRAVENOUS

## 2018-12-07 MED ORDER — EPHEDRINE SULFATE-NACL 50-0.9 MG/10ML-% IV SOSY
PREFILLED_SYRINGE | INTRAVENOUS | Status: DC | PRN
  Administered 2018-12-07 (×2): 5 mg via INTRAVENOUS

## 2018-12-07 MED ORDER — MORPHINE SULFATE (PF) 2 MG/ML IV SOLN
2.0000 mg | INTRAVENOUS | Status: DC | PRN
  Administered 2018-12-08: 2 mg via INTRAVENOUS
  Filled 2018-12-07: qty 1

## 2018-12-07 MED ORDER — ONDANSETRON HCL 4 MG/2ML IJ SOLN
4.0000 mg | Freq: Four times a day (QID) | INTRAMUSCULAR | Status: DC | PRN
  Administered 2018-12-07: 4 mg via INTRAVENOUS

## 2018-12-07 MED ORDER — HYDRALAZINE HCL 20 MG/ML IJ SOLN: 10.0000 mg | INTRAMUSCULAR | Status: DC | PRN

## 2018-12-07 MED ORDER — ACETAMINOPHEN 10 MG/ML IV SOLN
INTRAVENOUS | Status: AC
  Filled 2018-12-07: qty 100

## 2018-12-07 MED ORDER — METOPROLOL SUCCINATE ER 100 MG PO TB24
100.0000 mg | ORAL_TABLET | Freq: Every day | ORAL | Status: DC
  Filled 2018-12-07: qty 1

## 2018-12-07 MED ORDER — SUGAMMADEX SODIUM 200 MG/2ML IV SOLN
INTRAVENOUS | Status: DC | PRN
  Administered 2018-12-07: 200 mg via INTRAVENOUS

## 2018-12-07 MED ORDER — PHENYLEPHRINE 40 MCG/ML (10ML) SYRINGE FOR IV PUSH (FOR BLOOD PRESSURE SUPPORT)
PREFILLED_SYRINGE | INTRAVENOUS | Status: DC | PRN
  Administered 2018-12-07: 80 ug via INTRAVENOUS

## 2018-12-07 MED ORDER — DOCUSATE SODIUM 100 MG PO CAPS
100.0000 mg | ORAL_CAPSULE | Freq: Two times a day (BID) | ORAL | Status: DC
  Filled 2018-12-07: qty 1

## 2018-12-07 MED ORDER — MIDAZOLAM HCL 2 MG/2ML IJ SOLN
INTRAMUSCULAR | Status: AC
  Filled 2018-12-07: qty 2

## 2018-12-07 MED ORDER — BUPIVACAINE HCL (PF) 0.5 % IJ SOLN
INTRAMUSCULAR | Status: DC | PRN
  Administered 2018-12-07: 20 mL

## 2018-12-07 MED ORDER — AMLODIPINE BESYLATE 5 MG PO TABS
5.0000 mg | ORAL_TABLET | Freq: Every day | ORAL | Status: DC
  Filled 2018-12-07: qty 1

## 2018-12-07 MED ORDER — FENTANYL CITRATE (PF) 250 MCG/5ML IJ SOLN
INTRAMUSCULAR | Status: AC
  Filled 2018-12-07: qty 5

## 2018-12-07 MED ORDER — ACETAMINOPHEN 650 MG RE SUPP: 650.0000 mg | Freq: Four times a day (QID) | RECTAL | Status: DC | PRN

## 2018-12-07 MED ORDER — ACETAMINOPHEN 10 MG/ML IV SOLN
INTRAVENOUS | Status: DC | PRN
  Administered 2018-12-07: 1000 mg via INTRAVENOUS

## 2018-12-07 MED ORDER — DEXAMETHASONE SODIUM PHOSPHATE 10 MG/ML IJ SOLN
INTRAMUSCULAR | Status: DC | PRN
  Administered 2018-12-07: 10 mg via INTRAVENOUS

## 2018-12-07 MED ORDER — PANTOPRAZOLE SODIUM 40 MG IV SOLR
40.0000 mg | Freq: Every day | INTRAVENOUS | Status: DC
  Administered 2018-12-07: 40 mg via INTRAVENOUS
  Filled 2018-12-07: qty 40

## 2018-12-07 MED ORDER — BUPIVACAINE HCL (PF) 0.5 % IJ SOLN
INTRAMUSCULAR | Status: AC
  Filled 2018-12-07: qty 30

## 2018-12-07 MED ORDER — ACETAMINOPHEN 325 MG PO TABS: 650.0000 mg | ORAL_TABLET | Freq: Four times a day (QID) | ORAL | Status: DC | PRN

## 2018-12-07 MED ORDER — FENTANYL CITRATE (PF) 100 MCG/2ML IJ SOLN: 25.0000 ug | INTRAMUSCULAR | Status: DC | PRN

## 2018-12-07 MED ORDER — ONDANSETRON 4 MG PO TBDP: 4.0000 mg | ORAL_TABLET | Freq: Four times a day (QID) | ORAL | Status: DC | PRN

## 2018-12-07 MED ORDER — PROPOFOL 10 MG/ML IV BOLUS
INTRAVENOUS | Status: AC
  Filled 2018-12-07: qty 20

## 2018-12-07 MED ORDER — IOHEXOL 300 MG/ML  SOLN
100.0000 mL | Freq: Once | INTRAMUSCULAR | Status: AC | PRN
  Administered 2018-12-07: 100 mL via INTRAVENOUS

## 2018-12-07 MED ORDER — DIPHENHYDRAMINE HCL 12.5 MG/5ML PO ELIX: 12.5000 mg | ORAL_SOLUTION | Freq: Four times a day (QID) | ORAL | Status: DC | PRN

## 2018-12-07 MED ORDER — LIDOCAINE 2% (20 MG/ML) 5 ML SYRINGE
INTRAMUSCULAR | Status: DC | PRN
  Administered 2018-12-07: 60 mg via INTRAVENOUS

## 2018-12-07 MED ORDER — LACTATED RINGERS IV SOLN
INTRAVENOUS | Status: DC | PRN
  Administered 2018-12-07: 21:00:00 via INTRAVENOUS

## 2018-12-07 MED ORDER — PROMETHAZINE HCL 25 MG/ML IJ SOLN: 6.2500 mg | INTRAMUSCULAR | Status: DC | PRN

## 2018-12-07 MED ORDER — SODIUM CHLORIDE 0.9 % IV SOLN
INTRAVENOUS | Status: DC
  Administered 2018-12-07: 23:00:00 via INTRAVENOUS

## 2018-12-07 MED ORDER — PROPOFOL 10 MG/ML IV BOLUS
INTRAVENOUS | Status: DC | PRN
  Administered 2018-12-07: 150 mg via INTRAVENOUS

## 2018-12-07 MED ORDER — MIDAZOLAM HCL 5 MG/5ML IJ SOLN
INTRAMUSCULAR | Status: DC | PRN
  Administered 2018-12-07: 2 mg via INTRAVENOUS

## 2018-12-07 MED ORDER — UMECLIDINIUM-VILANTEROL 62.5-25 MCG/INH IN AEPB
1.0000 | INHALATION_SPRAY | Freq: Every day | RESPIRATORY_TRACT | Status: DC
  Filled 2018-12-07: qty 14

## 2018-12-07 MED ORDER — SODIUM CHLORIDE (PF) 0.9 % IJ SOLN
INTRAMUSCULAR | Status: AC
  Filled 2018-12-07: qty 50

## 2018-12-07 MED ORDER — FENTANYL CITRATE (PF) 100 MCG/2ML IJ SOLN
INTRAMUSCULAR | Status: DC | PRN
  Administered 2018-12-07 (×2): 50 ug via INTRAVENOUS

## 2018-12-07 SURGICAL SUPPLY — 37 items
ADH SKN CLS APL DERMABOND .7 (GAUZE/BANDAGES/DRESSINGS) ×1
APL PRP STRL LF DISP 70% ISPRP (MISCELLANEOUS) ×1
APPLIER CLIP 5 13 M/L LIGAMAX5 (MISCELLANEOUS)
APR CLP MED LRG 5 ANG JAW (MISCELLANEOUS)
BAG SPEC RTRVL LRG 6X4 10 (ENDOMECHANICALS) ×1
CHLORAPREP W/TINT 26 (MISCELLANEOUS) ×3 IMPLANT
CLIP APPLIE 5 13 M/L LIGAMAX5 (MISCELLANEOUS) IMPLANT
COVER SURGICAL LIGHT HANDLE (MISCELLANEOUS) ×3 IMPLANT
COVER WAND RF STERILE (DRAPES) IMPLANT
CUTTER FLEX LINEAR 45M (STAPLE) ×2 IMPLANT
DECANTER SPIKE VIAL GLASS SM (MISCELLANEOUS) ×3 IMPLANT
DERMABOND ADVANCED (GAUZE/BANDAGES/DRESSINGS) ×2
DERMABOND ADVANCED .7 DNX12 (GAUZE/BANDAGES/DRESSINGS) ×1 IMPLANT
DRAPE LAPAROSCOPIC ABDOMINAL (DRAPES) ×3 IMPLANT
ELECT COAG MONOPOLAR (ELECTROSURGICAL) ×3
ELECT REM PT RETURN 15FT ADLT (MISCELLANEOUS) ×3 IMPLANT
ELECTRODE COAG MONOPOLAR (ELECTROSURGICAL) ×1 IMPLANT
GLOVE SURG SIGNA 7.5 PF LTX (GLOVE) ×10 IMPLANT
GOWN STRL REUS W/TWL XL LVL3 (GOWN DISPOSABLE) ×6 IMPLANT
KIT BASIN OR (CUSTOM PROCEDURE TRAY) ×3 IMPLANT
KIT TURNOVER KIT A (KITS) ×2 IMPLANT
POUCH SPECIMEN RETRIEVAL 10MM (ENDOMECHANICALS) ×3 IMPLANT
RELOAD 45 VASCULAR/THIN (ENDOMECHANICALS) IMPLANT
RELOAD STAPLE 45 2.5 WHT GRN (ENDOMECHANICALS) IMPLANT
RELOAD STAPLE 45 3.5 BLU ETS (ENDOMECHANICALS) IMPLANT
RELOAD STAPLE TA45 3.5 REG BLU (ENDOMECHANICALS) ×3 IMPLANT
SET IRRIG TUBING LAPAROSCOPIC (IRRIGATION / IRRIGATOR) ×3 IMPLANT
SET TUBE SMOKE EVAC HIGH FLOW (TUBING) ×3 IMPLANT
SHEARS HARMONIC ACE PLUS 36CM (ENDOMECHANICALS) ×2 IMPLANT
SLEEVE XCEL OPT CAN 5 100 (ENDOMECHANICALS) ×3 IMPLANT
SUT MNCRL AB 4-0 PS2 18 (SUTURE) ×3 IMPLANT
TOWEL OR 17X26 10 PK STRL BLUE (TOWEL DISPOSABLE) ×3 IMPLANT
TOWEL OR NON WOVEN STRL DISP B (DISPOSABLE) ×1 IMPLANT
TRAY FOLEY MTR SLVR 16FR STAT (SET/KITS/TRAYS/PACK) ×1 IMPLANT
TRAY LAPAROSCOPIC (CUSTOM PROCEDURE TRAY) ×3 IMPLANT
TROCAR BLADELESS OPT 5 100 (ENDOMECHANICALS) ×3 IMPLANT
TROCAR XCEL BLUNT TIP 100MML (ENDOMECHANICALS) ×3 IMPLANT

## 2018-12-07 NOTE — Transfer of Care (Signed)
Immediate Anesthesia Transfer of Care Note  Patient: Sean Rose  Procedure(s) Performed: APPENDECTOMY LAPAROSCOPIC (N/A )  Patient Location: PACU  Anesthesia Type:General  Level of Consciousness: awake, oriented and patient cooperative  Airway & Oxygen Therapy: Patient Spontanous Breathing and Patient connected to face mask oxygen  Post-op Assessment: Report given to RN, Post -op Vital signs reviewed and stable and Patient moving all extremities X 4  Post vital signs: Reviewed and stable  Last Vitals:  Vitals Value Taken Time  BP 179/97 12/07/2018  9:32 PM  Temp 36.5 C 12/07/2018  9:32 PM  Pulse 68 12/07/2018  9:32 PM  Resp 11 12/07/2018  9:35 PM  SpO2    Vitals shown include unvalidated device data.  Last Pain:  Vitals:   12/07/18 1349  TempSrc: Oral  PainSc:       Patients Stated Pain Goal: 3 (46/65/99 3570)  Complications: No apparent anesthesia complications

## 2018-12-07 NOTE — ED Notes (Signed)
Bed: WA01 Expected date:  Expected time:  Means of arrival:  Comments: [place] appy r/o

## 2018-12-07 NOTE — ED Provider Notes (Signed)
[facility] DEPT Provider Note   CSN: [id number] Arrival date & time: 12/07/18  1204    History   Chief Complaint Chief Complaint  Patient presents with   Abdominal Pain   Weakness    HPI Sean Rose is a 63 y.o. male.     HPI   Pt is a 63 y/o anginal pain, CAD, CAP, COPD, GERD, heart murmur, lung CA (on chemo, right lobectomy 3/20), HTN, HLD, PAD, who presents to the ED today c/o abd pain that began 2 days ago. States pain started to bilat lower abdomen, and became localized to the RLQ. Pain rated 5-6/10. States that he had rebound TTP in the office and pain was 8/10. Had temp 99.75F yesterday, temporally. Has had nausea and diarrhea (improving), but denies, vomiting, constipation. No bloody stools. Has not tried any medications. Sitting still improves pain. Worse with palpation and movement. States denies dysuria, frequency, urgency, hematuria, however he states he has had decreased stream when urinating at night.   Seen by oncologist pta and was told to come to ED to r/o appe.   Ocologist: Dr. Earlie Server  Past Medical History:  Diagnosis Date   Anginal pain ([place])    CAD (coronary artery disease)    CAP (community acquired pneumonia) 09/2016   COPD (chronic obstructive pulmonary disease) (HCC)    GERD (gastroesophageal reflux disease)    Heart murmur    "I was told I had one when I was a kid"   Hemorrhoids    History of anal fissures    "no surgeries" (10/30/2016)   Hyperlipidemia    Hypertension    Peripheral arterial disease ([place])    status post right common iliac artery stenting back in 2007   Seasonal allergies    Tobacco abuse     Patient Active Problem List   Diagnosis Date Noted   Abdominal pain 12/07/2018   Adenocarcinoma of right lung, stage 3 ([place]) 10/13/2018   Goals of care, counseling/discussion 10/13/2018   Encounter for antineoplastic chemotherapy 10/13/2018   S/P Right VATS with Right Upper  Lobectomy, Lymph Node Sampling, Intercostal Nerve Block 09/14/2018   Pneumothorax after biopsy 09/02/2018   Stage 1 mild COPD by GOLD classification ([place]) 07/30/2018   Syncope 07/23/2018   Antiplatelet or antithrombotic long-term use 07/14/2018   Nodule of upper lobe of right lung 07/14/2018   Multiple lung nodules on CT 07/14/2018   Nausea vomiting and diarrhea 10/30/2016   Hyponatremia 10/30/2016   Hypocalcemia 10/30/2016   Hypokalemia 04/14/2015   Hypomagnesemia 04/14/2015   Claudication ([place]) 04/13/2015   Essential hypertension 01/14/2014   Hyperlipidemia 01/14/2014   Thrombosed external hemorrhoids 12/07/2013   Chronic anticoagulation 01/20/2013   PVD (peripheral vascular disease) ([place]) 01/20/2013   Coronary atherosclerosis of native coronary artery 01/20/2013   GERD (gastroesophageal reflux disease) 01/20/2013   Benign esophageal stricture 01/20/2013   DENTAL CARIES 07/20/2010    Past Surgical History:  Procedure Laterality Date   ANKLE SURGERY Left    "rebuilt it"   ANTERIOR CRUCIATE LIGAMENT REPAIR Right    CARDIAC CATHETERIZATION  09/23/2008   Continued medical therapy - may need GI evaluation in addition.   CARDIAC CATHETERIZATION  10/28/2007   Medical therapy recommended.   CARDIAC CATHETERIZATION  11/18/2006   In-stent restenosis RCA  (50% distal edge, 80% segmental mid, and 50-60% segmental proximal). Successful cutting balloon atherectomy using a [id number] cutting balloon. 3 inflations with atherectomy performed on mid and proximal portions resulting in reduction of 80%  mid in-stent restenosis to less than 20% residual and 50-60% segmental proximal to less than 20% residual without dissection.   CARDIAC CATHETERIZATION  02/26/2006   Severe stenosis in RCA. Stenting performed using IVUS. 3.5x20 Maverick balloon deployed at [employer]. Distal stent-a 4x28 Liberte stent-deployed 12atm 48sec, 12atm 31sec, 4atm 19sec. Mid stent-a 4x28 Liberte  stent-deployed 14atm 45sec, 14atm 60sec, 14atm 44sec. Proximal stent-4x8 Liberte- 14atm 45sec,14atm 47sec, 16atm 43sec. Severely diseased segment then appeared TIMI-3 flow.   CARDIOVASCULAR STRESS TEST  11/17/2012   No significant ECG changes. Septal perfusion defect is new when complared to study from 2010. Abnormal myocardial perfusion imaging with a basal to mid perfusion suggestive of previous MI.   CAROTID DOPPLER  08/09/2011   Bilateral Bulb/Proximal ICA - demonstrated a mild amount of fibrous plaque without evidence of significant diameter reduction reduction or other vascular abnormality.   CHEST TUBE INSERTION Right 09/02/2018   Procedure: Chest Tube Insertion;  Surgeon: Garner Nash, DO;  Location: [facility] OR;  Service: Thoracic;  Laterality: Right;   COLONOSCOPY     2003, 2014   CORONARY ANGIOPLASTY WITH STENT PLACEMENT     FEMORAL ARTERY STENT     INGUINAL HERNIA REPAIR Right    KNEE ARTHROSCOPY Right "multiple"   LOWER EXTREMITY ARTERIAL DOPPLER  01/31/2011   Bilateral ABIs-normal values with no suggestion of arterial insuff to the lower extremities at rest. Right CIA stent-mild amount of nonhemodynamically significant plaque is noted throughout   [place]   "bone-eating tumor"   PERCUTANEOUS STENT INTERVENTION  04/04/2006 & 04/13/2015   a. Right common iliac artery with an 8.0x18 mm Herculink stent deployed at 12 atm. Stenosis was reduced from 80% to 0% with brisk flow. b. I-cast stenting to left common iliac artery   PERIPHERAL VASCULAR CATHETERIZATION N/A 04/13/2015   Procedure: Lower Extremity Angiography;  Surgeon: Lorretta Harp, MD; L-oCIA 75%, 40-50% L-EIA, R-CIA stent patent, s/p 8 mm x 38 mm ICast covered stent>>0% stenosis in [place] Right    TRANSTHORACIC ECHOCARDIOGRAM  11/26/2012   EF not noted. Aortic valve-sclerosis without stenosis, no regurgiation.    UPPER GASTROINTESTINAL ENDOSCOPY     US  CAROTID DOPPLER BILATERAL ([place] [facility])  08/09/2011   Bilateral Bulb/Proximal ICAa demonstrated a mild amount of fibrous plaque without evidence of significant diameter reduction or any other vascular abnormality.   VIDEO ASSISTED THORACOSCOPY (VATS)/ LOBECTOMY Right 09/14/2018   Procedure: RIGHT VIDEO ASSISTED THORACOSCOPY (VATS)/ RIGHT UPPER LOBECTOMY;  Surgeon: Melrose Nakayama, MD;  Location: [facility];  Service: Thoracic;  Laterality: Right;   VIDEO BRONCHOSCOPY WITH ENDOBRONCHIAL NAVIGATION N/A 09/02/2018   Procedure: VIDEO BRONCHOSCOPY WITH ENDOBRONCHIAL NAVIGATION;  Surgeon: Garner Nash, DO;  Location: [facility];  Service: Thoracic;  Laterality: N/A;        Home Medications    Prior to Admission medications   Medication Sig Start Date End Date Taking? Authorizing Provider  amLODipine (NORVASC) 5 MG tablet Take 1 tablet (5 mg total) by mouth daily. 09/25/18  Yes [name], PA-C  aspirin EC 81 MG tablet Take 81 mg by mouth daily.   Yes [provider]  clopidogrel (PLAVIX) 75 MG tablet TAKE 1 TABLET BY MOUTH EVERY DAY Patient taking differently: Take 75 mg by mouth daily.  10/05/18  Yes Lorretta Harp, MD  dexamethasone (DECADRON) 4 MG tablet One tab po bid the day before, day of and day after chemo  every 3 weeks. 10/13/18  Yes [name], MD  diphenhydrAMINE (BENADRYL) 25 mg capsule Take 25 mg by mouth daily as needed for allergies.    Yes [provider]  folic acid (FOLVITE) 1 MG tablet Take 1 tablet (1 mg total) by mouth daily. 10/13/18  Yes [name], MD  ibuprofen (ADVIL,MOTRIN) 200 MG tablet Take 400 mg by mouth every 6 (six) hours as needed for headache or moderate pain.    Yes [provider]  Magnesium 250 MG TABS Take 250 mg by mouth daily.    Yes [provider]  metoprolol succinate (TOPROL-XL) 100 MG 24 hr tablet TAKE 1 TABLET BY MOUTH EVERY DAY Patient taking differently: Take 100 mg by mouth daily.  08/07/18  Yes  Lorretta Harp, MD  Multiple Vitamin (MULTIVITAMIN WITH MINERALS) TABS tablet Take 1 tablet by mouth daily.   Yes [provider]  nicotine (NICODERM CQ - DOSED IN MG/24 HOURS) 14 mg/24hr patch Place 1 patch (14 mg total) onto the skin daily. 09/25/18  Yes Roddenberry, Myron G, PA-C  umeclidinium-vilanterol (ANORO ELLIPTA) 62.5-25 MCG/INH AEPB Inhale 1 puff into the lungs daily. 08/14/18  Yes Icard, Bradley L, DO  oxyCODONE (OXY IR/ROXICODONE) 5 MG immediate release tablet Take 1 tablet (5 mg total) by mouth every 4 (four) hours as needed for severe pain. Patient not taking: Reported on 12/07/2018 09/24/18   [name], PA-C  prochlorperazine (COMPAZINE) 10 MG tablet Take 1 tablet (10 mg total) by mouth every 6 (six) hours as needed for nausea or vomiting. Patient not taking: Reported on 12/07/2018 10/13/18   [name], MD    Family History Family History  Problem Relation Age of Onset   Colon cancer Mother    Heart disease Father    Heart disease Paternal Grandfather     Social History Social History   Tobacco Use   Smoking status: Current Every Day Smoker    Packs/day: 0.25    Years: 38.00    Pack years: 9.50    Types: Cigarettes   Smokeless tobacco: Never Used   Tobacco comment: pack per day 12.17.19  Substance Use Topics   Alcohol use: Yes    Alcohol/week: 36.0 standard drinks    Types: 36 Cans of beer per week    Comment: 10/30/2016 "I usually drink 4-8 beers qd"   Drug use: No     Allergies   Tyloxapol   Review of Systems Review of Systems  Constitutional: Negative for chills and fever.  HENT: Negative for ear pain and sore throat.   Eyes: Negative for visual disturbance.  Respiratory: Negative for cough and shortness of breath.   Cardiovascular: Negative for chest pain.  Gastrointestinal: Positive for abdominal pain, diarrhea and nausea. Negative for constipation and vomiting.  Genitourinary: Positive for decreased urine volume.  Negative for dysuria and hematuria.  Musculoskeletal: Negative for back pain.  Skin: Negative for rash.  Neurological: Negative for headaches.  All other systems reviewed and are negative.    Physical Exam Updated Vital Signs BP (!) 146/88    Pulse 69    Temp 98.2 F (36.8 C) (Oral)    Resp 15    Ht _0  (1.727 m)    Wt 57.9 kg    SpO2 100%    BMI 19.40 kg/m   Physical Exam Vitals signs and nursing note reviewed.  Constitutional:      General: He is not in acute distress.    Appearance: He is well-developed.  He is not ill-appearing or toxic-appearing.  HENT:     Head: Normocephalic and atraumatic.  Eyes:     Conjunctiva/sclera: Conjunctivae normal.  Neck:     Musculoskeletal: Neck supple.  Cardiovascular:     Rate and Rhythm: Normal rate and regular rhythm.     Heart sounds: Normal heart sounds. No murmur.  Pulmonary:     Effort: Pulmonary effort is normal. No respiratory distress.     Breath sounds: Normal breath sounds. No wheezing, rhonchi or rales.  Abdominal:     General: Bowel sounds are normal.     Palpations: Abdomen is soft.     Tenderness: There is abdominal tenderness in the right lower quadrant. There is guarding. There is no right CVA tenderness, left CVA tenderness or rebound.  Skin:    General: Skin is warm and dry.  Neurological:     Mental Status: He is alert.     ED Treatments / Results  Labs (all labs ordered are listed, but only abnormal results are displayed) Labs Reviewed  URINALYSIS, ROUTINE W REFLEX MICROSCOPIC    EKG None  Radiology Ct Abdomen Pelvis W Contrast  Result Date: 12/07/2018 CLINICAL DATA:  Lung cancer, status post VATS. Now with right lower quadrant abdominal pain. EXAM: CT ABDOMEN AND PELVIS WITH CONTRAST TECHNIQUE: Multidetector CT imaging of the abdomen and pelvis was performed using the standard protocol following bolus administration of intravenous contrast. CONTRAST:  15m OMNIPAQUE IOHEXOL 300 MG/ML  SOLN COMPARISON:   PET-CT dated 07/24/2018. FINDINGS: Lower chest: Lung bases are clear. Hepatobiliary: Liver is within normal limits. Gallbladder is unremarkable. No intrahepatic or extrahepatic ductal dilatation. Pancreas: Within normal limits. Spleen: Within normal limits. Adrenals/Urinary Tract: Thickening of the bilateral adrenal glands, without discrete mass. Kidneys are within normal limits.  No hydronephrosis. Bladder is within normal limits. Stomach/Bowel: Stomach is within normal limits. No evidence of bowel obstruction. Prominent appendix (coronal images 56 and 70), increased in diameter when compared to prior PET, but not frankly enlarged. No definite periappendiceal inflammatory changes or fluid. However, there is soft tissue thickening involving the cecum at the base of the appendix (coronal image 68). This overall appearance raises concern for early acute appendicitis in the appropriate clinical setting, but remains equivocal. Vascular/Lymphatic: No evidence of abdominal aortic aneurysm. Atherosclerotic calcifications of the abdominal aorta and branch vessels. Retroaortic left renal vein. No suspicious abdominopelvic lymphadenopathy. Reproductive: Prostate is unremarkable. Other: No abdominopelvic ascites. Musculoskeletal: Mild degenerative changes the lower thoracic spine. [id number] IMPRESSION: Possible inflammatory changes involving the cecum at the base of the appendix. While equivocal, this raises concern for early acute appendicitis in the appropriate clinical setting. If intervention is not performed, consider short-term follow-up CT in 24-48 hours to assess for interval change. These results were called by telephone at the time of interpretation on 12/07/2018 at 3:32 pm to Dr. CCoral Ceo, who verbally acknowledged these results. Electronically Signed   By: SJulian HyM.D.   On: 12/07/2018 15:33    Procedures Procedures (including critical care time)  Medications Ordered in ED Medications  sodium  chloride (PF) 0.9 % injection (has no administration in time range)  iohexol (OMNIPAQUE) 300 MG/ML solution 100 mL (100 mLs Intravenous Contrast Given 12/07/18 1423)     Initial Impression / Assessment and Plan / ED Course  I have reviewed the triage vital signs and the nursing notes.  Pertinent labs & imaging results that were available during my care of the patient were reviewed by me and considered  in my medical decision making (see chart for details).     Final Clinical Impressions(s) / ED Diagnoses   Final diagnoses:  None   Pt presenting to the ED today c/o abd pain that began 2 days ago. Initially bilat low abd pain, now RLQ. Associated with nausea, diarrhea and decreased urinary stream. Was seen by his oncologist PTA and had rebound TTP in the RLQ. Sent here for r/o appendicitis.  Had labs drawn at [facility] which were reviewed. CBC without leukocytosis. CMP with mild hyponatremia and hypochloremia. Normal kidney and liver function. Mg slightly low. UA negative.   Pt declined nausea or pain meds on initial eval.   CT abd/pelvis with possible inflammatory changes involving the cecum at the base of the appendix. While equivocal, this raises concern for early acute appendicitis in the appropriate clinical setting. If intervention is not performed, consider short-term follow-up CT in 24-48 hours to assess for interval change.  Case was discussed with general surgery, Dr. Kae Heller, who states that surgery team will come evaluate the patient and give recommendations.  Care transitioned to [facility], PA-C at shift change pending consult with general surgery.   ED Discharge Orders    None       Bishop Dublin 12/07/18 1904    Jola Schmidt, MD 12/08/18 [phone]

## 2018-12-07 NOTE — H&P (Signed)
[facility] ([facility]) Surgery Admission Note  Sean Rose 03/24/1956  [id number].    Requesting Provider: Cortni Courture, PA-C Chief Complaint/Reason for Consult: possible appendicitis  HPI:   Pt is a 63 yo male with a hx of Stage IIIa (T3, N1, M0) non-small cell lung cancer, adenocarcinoma, S/P R upper lobectomy, COPD, Tobacco abuse, CAD, GERD, HTN, peripheral arterial disease who presented to the ED under recommendations of Dr. [name] his oncologist. Pt saw Dr. [name] today for follow up of cancer treatment and told him about his abdominal pain. Had rebound tenderness on exam with Dr. [name]. Pt states pain started Saturday and was mild and only with movement. Worsened overnight then improved on Sunday. Pain was in the lower abdomen and is now concentrated to the RLQ, severe with movement, no pain at rest, does not radiate. Pain rated 5-6/10. Associated subjective fevers, diarrhea for 2 days prior to onset of pain and nausea. No vomiting. Pt is supposed to have chemo tomorrow.   Abdominal Surg Hx: R inguinal hernia repair On plavix  ROS:  Review of Systems  Constitutional: Negative for chills, diaphoresis and fever.  HENT: Negative for sore throat.   Respiratory: Negative for cough and shortness of breath.   Cardiovascular: Negative for chest pain.  Gastrointestinal: Positive for abdominal pain, diarrhea and nausea. Negative for blood in stool, constipation and vomiting.  Genitourinary: Negative for dysuria.  Skin: Negative for rash.  Neurological: Negative for dizziness and loss of consciousness.  All other systems reviewed and are negative.    Family History  Problem Relation Age of Onset  . Colon cancer Mother   . Heart disease Father   . Heart disease Paternal Grandfather     Past Medical History:  Diagnosis Date  . Anginal pain ([place])   . CAD (coronary artery disease)   . CAP (community acquired pneumonia) 09/2016  . COPD (chronic obstructive pulmonary disease) ([place])    . GERD (gastroesophageal reflux disease)   . Heart murmur    "I was told I had one when I was a kid"  . Hemorrhoids   . History of anal fissures    "no surgeries" (10/30/2016)  . Hyperlipidemia   . Hypertension   . Peripheral arterial disease ([place])    status post right common iliac artery stenting back in 2007  . Seasonal allergies   . Tobacco abuse     Past Surgical History:  Procedure Laterality Date  . ANKLE SURGERY Left    "rebuilt it"  . ANTERIOR CRUCIATE LIGAMENT REPAIR Right   . CARDIAC CATHETERIZATION  09/23/2008   Continued medical therapy - may need GI evaluation in addition.  Marland Kitchen CARDIAC CATHETERIZATION  10/28/2007   Medical therapy recommended.  Marland Kitchen CARDIAC CATHETERIZATION  11/18/2006   In-stent restenosis RCA  (50% distal edge, 80% segmental mid, and 50-60% segmental proximal). Successful cutting balloon atherectomy using a 325X15 cutting balloon. 3 inflations with atherectomy performed on mid and proximal portions resulting in reduction of 80% mid in-stent restenosis to less than 20% residual and 50-60% segmental proximal to less than 20% residual without dissection.  Marland Kitchen CARDIAC CATHETERIZATION  02/26/2006   Severe stenosis in RCA. Stenting performed using IVUS. 3.5x20 Maverick balloon deployed at [employer]. Distal stent-a 4x28 Liberte stent-deployed 12atm 48sec, 12atm 31sec, 4atm 19sec. Mid stent-a 4x28 Liberte stent-deployed 14atm 45sec, 14atm 60sec, 14atm 44sec. Proximal stent-4x8 Liberte- 14atm 45sec,14atm 47sec, 16atm 43sec. Severely diseased segment then appeared TIMI-3 flow.  Marland Kitchen CARDIOVASCULAR STRESS TEST  11/17/2012   No significant  ECG changes. Septal perfusion defect is new when complared to study from 2010. Abnormal myocardial perfusion imaging with a basal to mid perfusion suggestive of previous MI.  Marland Kitchen CAROTID DOPPLER  08/09/2011   Bilateral Bulb/Proximal ICA - demonstrated a mild amount of fibrous plaque without evidence of significant diameter reduction reduction or  other vascular abnormality.  . CHEST TUBE INSERTION Right 09/02/2018   Procedure: Chest Tube Insertion;  Surgeon: Garner Nash, DO;  Location: [facility];  Service: Thoracic;  Laterality: Right;  . COLONOSCOPY     2003, 2014  . CORONARY ANGIOPLASTY WITH STENT PLACEMENT    . FEMORAL ARTERY STENT    . INGUINAL HERNIA REPAIR Right   . KNEE ARTHROSCOPY Right "multiple"  . LOWER EXTREMITY ARTERIAL DOPPLER  01/31/2011   Bilateral ABIs-normal values with no suggestion of arterial insuff to the lower extremities at rest. Right CIA stent-mild amount of nonhemodynamically significant plaque is noted throughout  . 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 PERCUTANEOUS STENT INTERVENTION  04/04/2006 & 04/13/2015   a. Right common iliac artery with an 8.0x18 mm Herculink stent deployed at 12 atm. Stenosis was reduced from 80% to 0% with brisk flow. b. I-cast stenting to left common iliac artery  . PERIPHERAL VASCULAR CATHETERIZATION N/A 04/13/2015   Procedure: Lower Extremity Angiography;  Surgeon: Lorretta Harp, MD; L-oCIA 75%, 40-50% L-EIA, R-CIA stent patent, s/p 8 mm x 38 mm ICast covered stent>>0% stenosis in L-oCIA     . SHOULDER ARTHROSCOPY WITH ROTATOR CUFF REPAIR Right   . TRANSTHORACIC ECHOCARDIOGRAM  11/26/2012   EF not noted. Aortic valve-sclerosis without stenosis, no regurgiation.   Marland Kitchen UPPER GASTROINTESTINAL ENDOSCOPY    . US CAROTID DOPPLER BILATERAL ([place] HX)  08/09/2011   Bilateral Bulb/Proximal ICAa demonstrated a mild amount of fibrous plaque without evidence of significant diameter reduction or any other vascular abnormality.  Marland Kitchen VIDEO ASSISTED THORACOSCOPY (VATS)/ LOBECTOMY Right 09/14/2018   Procedure: RIGHT VIDEO ASSISTED THORACOSCOPY (VATS)/ RIGHT UPPER LOBECTOMY;  Surgeon: Melrose Nakayama, MD;  Location: [facility];  Service: Thoracic;  Laterality: Right;  Marland Kitchen VIDEO BRONCHOSCOPY WITH ENDOBRONCHIAL NAVIGATION N/A 09/02/2018   Procedure: VIDEO BRONCHOSCOPY WITH ENDOBRONCHIAL NAVIGATION;   Surgeon: Garner Nash, DO;  Location: [facility];  Service: Thoracic;  Laterality: N/A;    Social History:  reports that he has been smoking cigarettes. He has a 9.50 pack-year smoking history. He has never used smokeless tobacco. He reports current alcohol use of about 36.0 standard drinks of alcohol per week. He reports that he does not use drugs.  Allergies:  Allergies  Allergen Reactions  . Tyloxapol Nausea Only    (Not in a hospital admission)   Blood pressure (!) 146/88, pulse 69, temperature 98.2 F (36.8 C), temperature source Oral, resp. rate 15, height '5\' 8"'  (1.727 m), weight 57.9 kg, SpO2 100 %.  Physical Exam Vitals signs and nursing note reviewed.  Constitutional:      General: He is not in acute distress.    Appearance: Normal appearance. He is not diaphoretic.  HENT:     Head: Normocephalic and atraumatic.     Nose: Nose normal.     Mouth/Throat:     Lips: Pink.     Mouth: Mucous membranes are moist.     Pharynx: Oropharynx is clear.  Eyes:     General: Lids are normal. No scleral icterus.       Right eye: No discharge.  Left eye: No discharge.     Conjunctiva/sclera: Conjunctivae normal.     Pupils: Pupils are equal, round, and reactive to light.  Neck:     Musculoskeletal: Normal range of motion and neck supple.  Cardiovascular:     Rate and Rhythm: Normal rate and regular rhythm.     Pulses:          Radial pulses are 2+ on the right side and 2+ on the left side.     Heart sounds: Normal heart sounds. No murmur.  Pulmonary:     Effort: Pulmonary effort is normal. No respiratory distress.     Breath sounds: Examination of the right-upper field reveals decreased breath sounds. Examination of the right-middle field reveals decreased breath sounds. Decreased breath sounds present. No wheezing, rhonchi or rales.  Abdominal:     General: Bowel sounds are normal. There is no distension.     Palpations: Abdomen is soft. Abdomen is not rigid.      Tenderness: There is abdominal tenderness in the right lower quadrant. There is guarding and rebound. Positive signs include McBurney's sign.  Musculoskeletal: Normal range of motion.        General: No tenderness or deformity.  Skin:    General: Skin is warm and dry.     Findings: No rash.  Neurological:     Mental Status: He is alert and oriented to person, place, and time.  Psychiatric:        Mood and Affect: Mood normal.        Behavior: Behavior normal.     Results for orders placed or performed during the hospital encounter of 12/07/18 (from the past 48 hour(s))  Urinalysis, Routine w reflex microscopic     Status: None   Collection Time: 12/07/18 12:25 PM  Result Value Ref Range   Color, Urine YELLOW YELLOW   APPearance CLEAR CLEAR   Specific Gravity, Urine 1.013 1.005 - 1.030   pH 7.0 5.0 - 8.0   Glucose, UA NEGATIVE NEGATIVE mg/dL   Hgb urine dipstick NEGATIVE NEGATIVE   Bilirubin Urine NEGATIVE NEGATIVE   Ketones, ur NEGATIVE NEGATIVE mg/dL   Protein, ur NEGATIVE NEGATIVE mg/dL   Nitrite NEGATIVE NEGATIVE   Leukocytes,Ua NEGATIVE NEGATIVE    Comment: Performed at [facility], [place] [address], [place], [place] [address]   Ct Abdomen Pelvis W Contrast  Result Date: 12/07/2018 CLINICAL DATA:  Lung cancer, status post VATS. Now with right lower quadrant abdominal pain. EXAM: CT ABDOMEN AND PELVIS WITH CONTRAST TECHNIQUE: Multidetector CT imaging of the abdomen and pelvis was performed using the standard protocol following bolus administration of intravenous contrast. CONTRAST:  123m OMNIPAQUE IOHEXOL 300 MG/ML  SOLN COMPARISON:  PET-CT dated 07/24/2018. FINDINGS: Lower chest: Lung bases are clear. Hepatobiliary: Liver is within normal limits. Gallbladder is unremarkable. No intrahepatic or extrahepatic ductal dilatation. Pancreas: Within normal limits. Spleen: Within normal limits. Adrenals/Urinary Tract: Thickening of the bilateral adrenal glands, without  discrete mass. Kidneys are within normal limits.  No hydronephrosis. Bladder is within normal limits. Stomach/Bowel: Stomach is within normal limits. No evidence of bowel obstruction. Prominent appendix (coronal images 56 and 70), increased in diameter when compared to prior PET, but not frankly enlarged. No definite periappendiceal inflammatory changes or fluid. However, there is soft tissue thickening involving the cecum at the base of the appendix (coronal image 68). This overall appearance raises concern for early acute appendicitis in the appropriate clinical setting, but remains equivocal. Vascular/Lymphatic: No evidence of  abdominal aortic aneurysm. Atherosclerotic calcifications of the abdominal aorta and branch vessels. Retroaortic left renal vein. No suspicious abdominopelvic lymphadenopathy. Reproductive: Prostate is unremarkable. Other: No abdominopelvic ascites. Musculoskeletal: Mild degenerative changes the lower thoracic spine. [id number] IMPRESSION: Possible inflammatory changes involving the cecum at the base of the appendix. While equivocal, this raises concern for early acute appendicitis in the appropriate clinical setting. If intervention is not performed, consider short-term follow-up CT in 24-48 hours to assess for interval change. These results were called by telephone at the time of interpretation on 12/07/2018 at 3:32 pm to Dr. Coral Ceo , who verbally acknowledged these results. Electronically Signed   By: Julian Hy M.D.   On: 12/07/2018 15:33      Assessment/Plan Active Problems:   * No active hospital problems. *  Appendicitis  - OR today for lap appy - admit to [facility] - NPO, IV abx, IVF   Kalman Drape, [facility] Surgery 12/07/2018, 4:10 PM Pager: [phone] Consults: [phone] Mon-Fri 7:00 am-4:30 pm Sat-Sun 7:00 am-11:30 am

## 2018-12-07 NOTE — ED Provider Notes (Signed)
16:00: Assumed care of patient at change of shift pending general surgery evaluation and recommendations.  Prior provider note for full H&P. The patient is a 63 year old male with a history of lung cancer on chemotherapy (last chemo 3 weeks prior, prior lobectomy 03/20), COPD, CAD, as well as additional medical comorbidities listed in his history who presented to the emergency department with right lower quadrant abdominal pain.  Associated nausea, diarrhea, and low-grade temp of 99.6.   ED Course/Procedures     Results for orders placed or performed during the hospital encounter of 12/07/18  Urinalysis, Routine w reflex microscopic  Result Value Ref Range   Color, Urine YELLOW YELLOW   APPearance CLEAR CLEAR   Specific Gravity, Urine 1.013 1.005 - 1.030   pH 7.0 5.0 - 8.0   Glucose, UA NEGATIVE NEGATIVE mg/dL   Hgb urine dipstick NEGATIVE NEGATIVE   Bilirubin Urine NEGATIVE NEGATIVE   Ketones, ur NEGATIVE NEGATIVE mg/dL   Protein, ur NEGATIVE NEGATIVE mg/dL   Nitrite NEGATIVE NEGATIVE   Leukocytes,Ua NEGATIVE NEGATIVE   Ct Abdomen Pelvis W Contrast  Result Date: 12/07/2018 CLINICAL DATA:  Lung cancer, status post VATS. Now with right lower quadrant abdominal pain. EXAM: CT ABDOMEN AND PELVIS WITH CONTRAST TECHNIQUE: Multidetector CT imaging of the abdomen and pelvis was performed using the standard protocol following bolus administration of intravenous contrast. CONTRAST:  148mL OMNIPAQUE IOHEXOL 300 MG/ML  SOLN COMPARISON:  PET-CT dated 07/24/2018. FINDINGS: Lower chest: Lung bases are clear. Hepatobiliary: Liver is within normal limits. Gallbladder is unremarkable. No intrahepatic or extrahepatic ductal dilatation. Pancreas: Within normal limits. Spleen: Within normal limits. Adrenals/Urinary Tract: Thickening of the bilateral adrenal glands, without discrete mass. Kidneys are within normal limits.  No hydronephrosis. Bladder is within normal limits. Stomach/Bowel: Stomach is within  normal limits. No evidence of bowel obstruction. Prominent appendix (coronal images 56 and 70), increased in diameter when compared to prior PET, but not frankly enlarged. No definite periappendiceal inflammatory changes or fluid. However, there is soft tissue thickening involving the cecum at the base of the appendix (coronal image 68). This overall appearance raises concern for early acute appendicitis in the appropriate clinical setting, but remains equivocal. Vascular/Lymphatic: No evidence of abdominal aortic aneurysm. Atherosclerotic calcifications of the abdominal aorta and branch vessels. Retroaortic left renal vein. No suspicious abdominopelvic lymphadenopathy. Reproductive: Prostate is unremarkable. Other: No abdominopelvic ascites. Musculoskeletal: Mild degenerative changes the lower thoracic spine. [id number] IMPRESSION: Possible inflammatory changes involving the cecum at the base of the appendix. While equivocal, this raises concern for early acute appendicitis in the appropriate clinical setting. If intervention is not performed, consider short-term follow-up CT in 24-48 hours to assess for interval change. These results were called by telephone at the time of interpretation on 12/07/2018 at 3:32 pm to Dr. Coral Ceo , who verbally acknowledged these results. Electronically Signed   By: Julian Hy M.D.   On: 12/07/2018 15:33   Procedures  MDM    Last chemotherapy 3 weeks prior. Outpatient labs performed today: CBC: No leukocytosis, stable hemoglobin.  CMP: Mild electrolyte abnormalities including hyponatremia at 130, hypochloremia at 95.  Hyperglycemia at 154.  Renal function preserved.  LFTs within normal limits. Magnesium: Mildly decreased at 1.6.  CT imaging with possible inflammatory changes about the cecum at the base of the appendix, equivocal, concern for early acute appendicitis.  General surgery was consulted, pending their evaluation in the emergency department.  17:55:  Discussed w/ general surgery team- plan to take to OR for appendectomy  tonight, they have placed orders. No need for hospitalist consultation.           Amaryllis Dyke, PA-C 12/07/18 1755    Sherwood Gambler, MD 12/10/18 1625

## 2018-12-07 NOTE — Op Note (Signed)
Appendectomy, Lap, Procedure Note  Indications: The patient presented with a history of right-sided abdominal pain. A CT revealed findings consistent with acute appendicitis.  Pre-operative Diagnosis: acute appendicitis  Post-operative Diagnosis: Same  Surgeon: Coralie Keens   Assistants: 0  Anesthesia: General endotracheal anesthesia  ASA Class: 3  Procedure Details  The patient was seen again in the Holding Room. The risks, benefits, complications, treatment options, and expected outcomes were discussed with the patient and/or family. The possibilities of reaction to medication, perforation of viscus, bleeding, recurrent infection, finding a normal appendix, the need for additional procedures, failure to diagnose a condition, and creating a complication requiring transfusion or operation were discussed. There was concurrence with the proposed plan and informed consent was obtained. The site of surgery was properly noted. The patient was taken to Operating Room, identified as [name] and the procedure verified as Appendectomy. A Time Out was held and the above information confirmed.  The patient was placed in the supine position and general anesthesia was induced, along with placement of orogastric tube, Venodyne boots, and a Foley catheter. The abdomen was prepped and draped in a sterile fashion. A one centimeter infraumbilical incision was made.  The midline fascia was incised with a #15 blade.  A Kelly clamp was used to confirm entrance into the peritoneal cavity.  A pursestring suture was passed around the incision with a 0 Vicryl.  The Hasson was introduced into the abdomen and the tails of the suture were used to hold the Hasson in place.   The pneumoperitoneum was then established to steady pressure of 15 mmHg.  Additional 5 mm cannulas then placed in the left lower quadrant of the abdomen and the right upper quadrant under direct visualization. A careful evaluation of the  entire abdomen was carried out. The patient was placed in Trendelenburg and left lateral decubitus position. The small intestines were retracted in the cephalad and left lateral direction away from the pelvis and right lower quadrant. The patient was found to have a mildly enlarged and inflamed appendix that was extending into the pelvis. There was no evidence of perforation.  The appendix was carefully dissected. The appendix was was skeletonized with the harmonic scalpel.   The appendix was divided at its base using an endo-GIA stapler. Minimal appendiceal stump was left in place. There was no evidence of bleeding, leakage, or complication after division of the appendix. Irrigation was also performed and irrigate suctioned from the abdomen as well.  The umbilical port site was closed with the purse string suture. There was no residual palpable fascial defect.  The trocar site skin wounds were closed with 4-0 Monocryl.  Instrument, sponge, and needle counts were correct at the conclusion of the case.   Findings: The appendix was found to be inflamed. There were not signs of necrosis.  There was not perforation. There was not abscess formation.  Estimated Blood Loss:  Minimal         Drains:none         Complications:  None; patient tolerated the procedure well.         Disposition: PACU - hemodynamically stable.         Condition: stable

## 2018-12-07 NOTE — Anesthesia Procedure Notes (Signed)
Procedure Name: Intubation Date/Time: 12/07/2018 8:49 PM Performed by: Silas Sacramento, CRNA Pre-anesthesia Checklist: Patient identified, Emergency Drugs available, Suction available and Patient being monitored Patient Re-evaluated:Patient Re-evaluated prior to induction Oxygen Delivery Method: Circle system utilized Preoxygenation: Pre-oxygenation with 100% oxygen Induction Type: IV induction and Rapid sequence Laryngoscope Size: Mac and 3 Grade View: Grade I Tube type: Oral Tube size: 7.0 mm Number of attempts: 1 Airway Equipment and Method: Stylet Placement Confirmation: ETT inserted through vocal cords under direct vision,  positive ETCO2 and breath sounds checked- equal and bilateral Secured at: 23 cm Tube secured with: Tape Dental Injury: Teeth and Oropharynx as per pre-operative assessment

## 2018-12-07 NOTE — Progress Notes (Signed)
ED TO INPATIENT HANDOFF REPORT  Name/Age/Gender Sean Rose 63 y.o. male  Code Status    Code Status Orders  (From admission, onward)         Start     Ordered   12/07/18 1856  Full code  Continuous     12/07/18 1855        Code Status History    Date Active Date Inactive Code Status Order ID Comments User Context   09/15/2018 0723 09/24/2018 1208 Full Code [id number]  Malon Kindle Inpatient   09/02/2018 1511 09/15/2018 0723 Full Code [id number]  Garner Nash, DO Inpatient   10/30/2016 1901 10/31/2016 1705 Full Code [id number]  Vianne Bulls, MD Inpatient   04/13/2015 1646 04/14/2015 1553 Full Code [id number]  Lorretta Harp, MD Inpatient      Home/SNF/Other Home  Chief Complaint abd pain  Level of Care/Admitting Diagnosis ED Disposition    ED Disposition Condition [name] Area: [facility] [[id number]]  Level of Care: Med-Surg [16]  Covid Evaluation: Screening Protocol (No Symptoms)  Diagnosis: Appendicitis [[id number]]  Admitting Physician: CCS, [place]  Attending Physician: CCS, MD [[id number]]  Estimated length of stay: past midnight tomorrow  Certification:: I certify this patient will need inpatient services for at least 2 midnights  Bed request comments: 5W  PT Class (Do Not Modify): Inpatient [101]  PT Acc Code (Do Not Modify): Private [1]       Medical History Past Medical History:  Diagnosis Date  . Anginal pain ([place])   . CAD (coronary artery disease)   . CAP (community acquired pneumonia) 09/2016  . COPD (chronic obstructive pulmonary disease) ([place])   . GERD (gastroesophageal reflux disease)   . Heart murmur    "I was told I had one when I was a kid"  . Hemorrhoids   . History of anal fissures    "no surgeries" (10/30/2016)  . Hyperlipidemia   . Hypertension   . Peripheral arterial disease ([place])    status post right common iliac artery stenting back in 2007  . Seasonal allergies   . Tobacco abuse      Allergies Allergies  Allergen Reactions  . Tyloxapol Nausea Only    IV Location/Drains/Wounds Patient Lines/Drains/Airways Status   Active Line/Drains/Airways    Name:   Placement date:   Placement time:   Site:   Days:   Peripheral IV 09/05/18 Right Forearm   09/05/18    2026    Forearm   93   Peripheral IV 09/14/18 Right Antecubital   09/14/18    1414    Antecubital   84   Peripheral IV 12/07/18 Anterior;Right Forearm   12/07/18    1321    Forearm   less than 1   Chest Tube 1 Right Pleural 28 Fr.   09/14/18    1719    Pleural   84   Incision (Closed) 09/14/18 Chest   09/14/18    1727     84          Labs/Imaging Results for orders placed or performed during the hospital encounter of 12/07/18 (from the past 48 hour(s))  Urinalysis, Routine w reflex microscopic     Status: None   Collection Time: 12/07/18 12:25 PM  Result Value Ref Range   Color, Urine YELLOW YELLOW   APPearance CLEAR CLEAR   Specific Gravity, Urine 1.013 1.005 - 1.030   pH 7.0 5.0 - 8.0  Glucose, UA NEGATIVE NEGATIVE mg/dL   Hgb urine dipstick NEGATIVE NEGATIVE   Bilirubin Urine NEGATIVE NEGATIVE   Ketones, ur NEGATIVE NEGATIVE mg/dL   Protein, ur NEGATIVE NEGATIVE mg/dL   Nitrite NEGATIVE NEGATIVE   Leukocytes,Ua NEGATIVE NEGATIVE    Comment: Performed at [facility] [address], [place], [place] [address]  SARS Coronavirus 2 (CEPHEID - Performed in [facility] hospital lab), Hosp Order     Status: None   Collection Time: 12/07/18  5:18 PM  Result Value Ref Range   SARS Coronavirus 2 NEGATIVE NEGATIVE    Comment: (NOTE) If result is NEGATIVE SARS-CoV-2 target nucleic acids are NOT DETECTED. The SARS-CoV-2 RNA is generally detectable in upper and lower  respiratory specimens during the acute phase of infection. The lowest  concentration of SARS-CoV-2 viral copies this assay can detect is 250  copies / mL. A negative result does not preclude SARS-CoV-2 infection  and  should not be used as the sole basis for treatment or other  patient management decisions.  A negative result may occur with  improper specimen collection / handling, submission of specimen other  than nasopharyngeal swab, presence of viral mutation(s) within the  areas targeted by this assay, and inadequate number of viral copies  (<250 copies / mL). A negative result must be combined with clinical  observations, patient history, and epidemiological information. If result is POSITIVE SARS-CoV-2 target nucleic acids are DETECTED. The SARS-CoV-2 RNA is generally detectable in upper and lower  respiratory specimens dur ing the acute phase of infection.  Positive  results are indicative of active infection with SARS-CoV-2.  Clinical  correlation with patient history and other diagnostic information is  necessary to determine patient infection status.  Positive results do  not rule out bacterial infection or co-infection with other viruses. If result is PRESUMPTIVE POSTIVE SARS-CoV-2 nucleic acids MAY BE PRESENT.   A presumptive positive result was obtained on the submitted specimen  and confirmed on repeat testing.  While 2019 novel coronavirus  (SARS-CoV-2) nucleic acids may be present in the submitted sample  additional confirmatory testing may be necessary for epidemiological  and / or clinical management purposes  to differentiate between  SARS-CoV-2 and other Sarbecovirus currently known to infect humans.  If clinically indicated additional testing with an alternate test  methodology [phone]) is advised. The SARS-CoV-2 RNA is generally  detectable in upper and lower respiratory sp ecimens during the acute  phase of infection. The expected result is Negative. Fact Sheet for Patients:  StrictlyIdeas.no Fact Sheet for Healthcare Providers: BankingDealers.co.za This test is not yet approved or cleared by the [place] FDA and has been  authorized for detection and/or diagnosis of SARS-CoV-2 by FDA under an Emergency Use Authorization (EUA).  This EUA will remain in effect (meaning this test can be used) for the duration of the COVID-19 declaration under Section 564(b)(1) of the Act, 21 U.S.C. section 360bbb-3(b)(1), unless the authorization is terminated or revoked sooner. Performed at [facility], [place] [address], [place], [place] [address]    Ct Abdomen Pelvis W Contrast  Result Date: 12/07/2018 CLINICAL DATA:  Lung cancer, status post VATS. Now with right lower quadrant abdominal pain. EXAM: CT ABDOMEN AND PELVIS WITH CONTRAST TECHNIQUE: Multidetector CT imaging of the abdomen and pelvis was performed using the standard protocol following bolus administration of intravenous contrast. CONTRAST:  155mL OMNIPAQUE IOHEXOL 300 MG/ML  SOLN COMPARISON:  PET-CT dated 07/24/2018. FINDINGS: Lower chest: Lung bases are clear.  Hepatobiliary: Liver is within normal limits. Gallbladder is unremarkable. No intrahepatic or extrahepatic ductal dilatation. Pancreas: Within normal limits. Spleen: Within normal limits. Adrenals/Urinary Tract: Thickening of the bilateral adrenal glands, without discrete mass. Kidneys are within normal limits.  No hydronephrosis. Bladder is within normal limits. Stomach/Bowel: Stomach is within normal limits. No evidence of bowel obstruction. Prominent appendix (coronal images 56 and 70), increased in diameter when compared to prior PET, but not frankly enlarged. No definite periappendiceal inflammatory changes or fluid. However, there is soft tissue thickening involving the cecum at the base of the appendix (coronal image 68). This overall appearance raises concern for early acute appendicitis in the appropriate clinical setting, but remains equivocal. Vascular/Lymphatic: No evidence of abdominal aortic aneurysm. Atherosclerotic calcifications of the abdominal aorta and branch vessels. Retroaortic  left renal vein. No suspicious abdominopelvic lymphadenopathy. Reproductive: Prostate is unremarkable. Other: No abdominopelvic ascites. Musculoskeletal: Mild degenerative changes the lower thoracic spine. [id number] IMPRESSION: Possible inflammatory changes involving the cecum at the base of the appendix. While equivocal, this raises concern for early acute appendicitis in the appropriate clinical setting. If intervention is not performed, consider short-term follow-up CT in 24-48 hours to assess for interval change. These results were called by telephone at the time of interpretation on 12/07/2018 at 3:32 pm to Dr. Coral Ceo , who verbally acknowledged these results. Electronically Signed   By: Julian Hy M.D.   On: 12/07/2018 15:33    Pending Labs Unresulted Labs (From admission, onward)    Start     Ordered   12/08/18 0500  CBC  Tomorrow morning,   R     12/07/18 1855   12/08/18 8527  Basic metabolic panel  Tomorrow morning,   R     12/07/18 1855          Vitals/Pain Today's Vitals   12/07/18 1443 12/07/18 1700 12/07/18 1856 12/07/18 1900  BP:  (!) 133/93 (!) 156/103 (!) 156/90  Pulse: 69 63 63 61  Resp:   15   Temp:      TempSrc:      SpO2: 100% 100% 100% 99%  Weight:      Height:      PainSc:        Isolation Precautions No active isolations  Medications Medications  sodium chloride (PF) 0.9 % injection (has no administration in time range)  0.9 %  sodium chloride infusion (has no administration in time range)  piperacillin-tazobactam (ZOSYN) IVPB 3.375 g (has no administration in time range)  acetaminophen (TYLENOL) tablet 650 mg (has no administration in time range)    Or  acetaminophen (TYLENOL) suppository 650 mg (has no administration in time range)  oxyCODONE (Oxy IR/ROXICODONE) immediate release tablet 5-10 mg (has no administration in time range)  morphine 2 MG/ML injection 2-4 mg (has no administration in time range)  diphenhydrAMINE (BENADRYL) 12.5  MG/5ML elixir 12.5 mg (has no administration in time range)    Or  diphenhydrAMINE (BENADRYL) injection 12.5 mg (has no administration in time range)  docusate sodium (COLACE) capsule 100 mg (has no administration in time range)  ondansetron (ZOFRAN-ODT) disintegrating tablet 4 mg (has no administration in time range)    Or  ondansetron (ZOFRAN) injection 4 mg (has no administration in time range)  pantoprazole (PROTONIX) injection 40 mg (has no administration in time range)  hydrALAZINE (APRESOLINE) injection 10 mg (has no administration in time range)  amLODipine (NORVASC) tablet 5 mg (has no administration in time range)  metoprolol succinate (TOPROL-XL) 24  hr tablet 100 mg (has no administration in time range)  umeclidinium-vilanterol (ANORO ELLIPTA) 62.5-25 MCG/INH 1 puff (has no administration in time range)  iohexol (OMNIPAQUE) 300 MG/ML solution 100 mL (100 mLs Intravenous Contrast Given 12/07/18 1423)    Mobility walks

## 2018-12-07 NOTE — ED Notes (Signed)
Report called to the receiving RN.

## 2018-12-07 NOTE — Anesthesia Preprocedure Evaluation (Addendum)
Anesthesia Evaluation  Patient identified by MRN, date of birth, ID band Patient awake    Reviewed: Allergy & Precautions, NPO status , Patient's Chart, lab work & pertinent test results, reviewed documented beta blocker date and time   Airway Mallampati: II  TM Distance: >3 FB Neck ROM: Full    Dental  (+) Dental Advisory Given, Caps, Missing, Poor Dentition   Pulmonary COPD, Current Smoker,  Lung cancer s/p Right VATS with Right Upper Lobectomy   Pulmonary exam normal breath sounds clear to auscultation       Cardiovascular hypertension, Pt. on home beta blockers and Pt. on medications + angina + CAD, + Past MI, + Cardiac Stents and + Peripheral Vascular Disease (status post right common iliac artery stenting-2007)  Normal cardiovascular exam Rhythm:Regular Rate:Normal     Neuro/Psych negative neurological ROS     GI/Hepatic Neg liver ROS, GERD  ,Appendicitis    Endo/Other  negative endocrine ROS  Renal/GU negative Renal ROS     Musculoskeletal negative musculoskeletal ROS (+)   Abdominal   Peds  Hematology  (+) Blood dyscrasia (Plavix), ,   Anesthesia Other Findings Day of surgery medications reviewed with the patient.  Reproductive/Obstetrics                            Anesthesia Physical Anesthesia Plan  ASA: III and emergent  Anesthesia Plan: General   Post-op Pain Management:    Induction: Intravenous  PONV Risk Score and Plan: 3 and Midazolam, Dexamethasone, Ondansetron and Diphenhydramine  Airway Management Planned: Oral ETT  Additional Equipment:   Intra-op Plan:   Post-operative Plan: Extubation in OR  Informed Consent: I have reviewed the patients History and Physical, chart, labs and discussed the procedure including the risks, benefits and alternatives for the proposed anesthesia with the patient or authorized representative who has indicated his/her  understanding and acceptance.     Dental advisory given  Plan Discussed with: CRNA  Anesthesia Plan Comments:        Anesthesia Quick Evaluation

## 2018-12-07 NOTE — ED Triage Notes (Signed)
Patient present to the ED after a follow up at the cancer center for RLQ abdominal pain with rebound tenderness. Pain was rated a 6/10 and he also reports nausea. Though the patient has been weak, he was able to walk into the cancer center. MD at the cancer center wanted to rule out appendicitis.    [place] Vitals: 141/97 BP 18 Resp Rate  98.3 temp

## 2018-12-07 NOTE — Progress Notes (Signed)
[facility] [phone]   [phone]  OFFICE PROGRESS NOTE  [name], NP [place] [address]  DIAGNOSIS: Stage IIIa (T3, N1, M0) non-small cell lung cancer, adenocarcinoma with no actionable mutations presented with multiple pulmonary nodules in the right upper lobe as well as metastatic disease in intraparenchymal lymphadenopathy.  This was diagnosed in February 2020.  PRIOR THERAPY: Status post right upper lobectomy with lymph node dissection.  CURRENT THERAPY: Adjuvant systemic chemotherapy with cisplatin 75 mg/M2 and Alimta 500 mg/M2 every 3 weeks.  Status post 2 cycles.  INTERVAL HISTORY: Sean Rose 63 y.o. male returns to clinic today for follow-up visit.  The patient is complaining of right lower quadrant abdominal pain started on Saturday and has been going on since that time.  He also has low-grade fever with mild nausea but no vomiting or diarrhea.  He denied having any chest pain, shortness of breath, cough or hemoptysis.  He denied having any current fever or chills.  He has no headache or visual changes.  The patient has no weight loss or night sweats.  He is here today for evaluation before starting his chemotherapy with cycle #3 tomorrow  MEDICAL HISTORY: Past Medical History:  Diagnosis Date  . Anginal pain ([place])   . CAD (coronary artery disease)   . CAP (community acquired pneumonia) 09/2016  . COPD (chronic obstructive pulmonary disease) ([place])   . GERD (gastroesophageal reflux disease)   . Heart murmur    "I was told I had one when I was a kid"  . Hemorrhoids   . History of anal fissures    "no surgeries" (10/30/2016)  . Hyperlipidemia   . Hypertension   . Peripheral arterial disease ([place])    status post right common iliac artery stenting back in 2007  . Seasonal allergies   . Tobacco abuse     ALLERGIES:  is allergic to oxycodone-acetaminophen; tylox [oxycodone-acetaminophen]; and  tyloxapol.  MEDICATIONS:  Current Outpatient Medications  Medication Sig Dispense Refill  . amLODipine (NORVASC) 5 MG tablet Take 1 tablet (5 mg total) by mouth daily. 30 tablet 1  . aspirin EC 81 MG tablet Take 81 mg by mouth daily.    . clopidogrel (PLAVIX) 75 MG tablet TAKE 1 TABLET BY MOUTH EVERY DAY 90 tablet 1  . dexamethasone (DECADRON) 4 MG tablet One tab po bid the day before, day of and day after chemo every 3 weeks. 40 tablet 0  . diphenhydrAMINE (BENADRYL) 25 mg capsule Take 25 mg by mouth daily as needed for allergies.     . folic acid (FOLVITE) 1 MG tablet Take 1 tablet (1 mg total) by mouth daily. 30 tablet 4  . ibuprofen (ADVIL,MOTRIN) 200 MG tablet Take 400 mg by mouth every 6 (six) hours as needed for headache or moderate pain.     . Magnesium 250 MG TABS Take 250 mg by mouth daily.     . metoprolol succinate (TOPROL-XL) 100 MG 24 hr tablet TAKE 1 TABLET BY MOUTH EVERY DAY (Patient taking differently: Take 100 mg by mouth daily. ) 90 tablet 1  . Multiple Vitamin (MULTIVITAMIN WITH MINERALS) TABS tablet Take 1 tablet by mouth daily.    . nicotine (NICODERM CQ - DOSED IN MG/24 HOURS) 14 mg/24hr patch Place 1 patch (14 mg total) onto the skin daily. 28 patch 0  . oxyCODONE (OXY IR/ROXICODONE) 5 MG immediate release tablet Take 1 tablet (5 mg total) by  mouth every 4 (four) hours as needed for severe pain. 20 tablet 0  . prochlorperazine (COMPAZINE) 10 MG tablet Take 1 tablet (10 mg total) by mouth every 6 (six) hours as needed for nausea or vomiting. 30 tablet 0  . umeclidinium-vilanterol (ANORO ELLIPTA) 62.5-25 MCG/INH AEPB Inhale 1 puff into the lungs daily. 60 each 3   No current facility-administered medications for this visit.     SURGICAL HISTORY:  Past Surgical History:  Procedure Laterality Date  . ANKLE SURGERY Left    "rebuilt it"  . ANTERIOR CRUCIATE LIGAMENT REPAIR Right   . CARDIAC CATHETERIZATION  09/23/2008   Continued medical therapy - may need GI evaluation  in addition.  Marland Kitchen CARDIAC CATHETERIZATION  10/28/2007   Medical therapy recommended.  Marland Kitchen CARDIAC CATHETERIZATION  11/18/2006   In-stent restenosis RCA  (50% distal edge, 80% segmental mid, and 50-60% segmental proximal). Successful cutting balloon atherectomy using a [id number] cutting balloon. 3 inflations with atherectomy performed on mid and proximal portions resulting in reduction of 80% mid in-stent restenosis to less than 20% residual and 50-60% segmental proximal to less than 20% residual without dissection.  Marland Kitchen CARDIAC CATHETERIZATION  02/26/2006   Severe stenosis in RCA. Stenting performed using IVUS. 3.5x20 Maverick balloon deployed at [employer]. Distal stent-a 4x28 Liberte stent-deployed 12atm 48sec, 12atm 31sec, 4atm 19sec. Mid stent-a 4x28 Liberte stent-deployed 14atm 45sec, 14atm 60sec, 14atm 44sec. Proximal stent-4x8 Liberte- 14atm 45sec,14atm 47sec, 16atm 43sec. Severely diseased segment then appeared TIMI-3 flow.  Marland Kitchen CARDIOVASCULAR STRESS TEST  11/17/2012   No significant ECG changes. Septal perfusion defect is new when complared to study from 2010. Abnormal myocardial perfusion imaging with a basal to mid perfusion suggestive of previous MI.  Marland Kitchen CAROTID DOPPLER  08/09/2011   Bilateral Bulb/Proximal ICA - demonstrated a mild amount of fibrous plaque without evidence of significant diameter reduction reduction or other vascular abnormality.  . CHEST TUBE INSERTION Right 09/02/2018   Procedure: Chest Tube Insertion;  Surgeon: Garner Nash, DO;  Location: [facility];  Service: Thoracic;  Laterality: Right;  . COLONOSCOPY     2003, 2014  . CORONARY ANGIOPLASTY WITH STENT PLACEMENT    . FEMORAL ARTERY STENT    . INGUINAL HERNIA REPAIR Right   . KNEE ARTHROSCOPY Right "multiple"  . LOWER EXTREMITY ARTERIAL DOPPLER  01/31/2011   Bilateral ABIs-normal values with no suggestion of arterial insuff to the lower extremities at rest. Right CIA stent-mild amount of nonhemodynamically significant plaque is noted  throughout  . 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 PERCUTANEOUS STENT INTERVENTION  04/04/2006 & 04/13/2015   a. Right common iliac artery with an 8.0x18 mm Herculink stent deployed at 12 atm. Stenosis was reduced from 80% to 0% with brisk flow. b. I-cast stenting to left common iliac artery  . PERIPHERAL VASCULAR CATHETERIZATION N/A 04/13/2015   Procedure: Lower Extremity Angiography;  Surgeon: Lorretta Harp, MD; L-oCIA 75%, 40-50% L-EIA, R-CIA stent patent, s/p 8 mm x 38 mm ICast covered stent>>0% stenosis in L-oCIA     . SHOULDER ARTHROSCOPY WITH ROTATOR CUFF REPAIR Right   . TRANSTHORACIC ECHOCARDIOGRAM  11/26/2012   EF not noted. Aortic valve-sclerosis without stenosis, no regurgiation.   Marland Kitchen UPPER GASTROINTESTINAL ENDOSCOPY    . US CAROTID DOPPLER BILATERAL ([place] HX)  08/09/2011   Bilateral Bulb/Proximal ICAa demonstrated a mild amount of fibrous plaque without evidence of significant diameter reduction or any other vascular abnormality.  Marland Kitchen VIDEO ASSISTED THORACOSCOPY (VATS)/ LOBECTOMY Right 09/14/2018  Procedure: RIGHT VIDEO ASSISTED THORACOSCOPY (VATS)/ RIGHT UPPER LOBECTOMY;  Surgeon: Melrose Nakayama, MD;  Location: [facility];  Service: Thoracic;  Laterality: Right;  Marland Kitchen VIDEO BRONCHOSCOPY WITH ENDOBRONCHIAL NAVIGATION N/A 09/02/2018   Procedure: VIDEO BRONCHOSCOPY WITH ENDOBRONCHIAL NAVIGATION;  Surgeon: Garner Nash, DO;  Location: [facility];  Service: Thoracic;  Laterality: N/A;    REVIEW OF SYSTEMS:  A comprehensive review of systems was negative except for: Constitutional: positive for fatigue Gastrointestinal: positive for abdominal pain and nausea   PHYSICAL EXAMINATION: General appearance: alert, cooperative and no distress Head: Normocephalic, without obvious abnormality, atraumatic Neck: no adenopathy, no JVD, supple, symmetrical, trachea midline and thyroid not enlarged, symmetric, no tenderness/mass/nodules Lymph nodes: Cervical, supraclavicular, and axillary nodes  normal. Resp: clear to auscultation bilaterally Back: symmetric, no curvature. ROM normal. No CVA tenderness. Cardio: regular rate and rhythm, S1, S2 normal, no murmur, click, rub or gallop GI: abnormal findings:  rebound tenderness and Tenderness to palpation. Extremities: extremities normal, atraumatic, no cyanosis or edema  ECOG PERFORMANCE STATUS: 1 - Symptomatic but completely ambulatory  Blood pressure 140/85, pulse 63, temperature 98.3 F (36.8 C), temperature source Oral, resp. rate 20, height _0  (1.727 m), weight 127 lb 9.6 oz (57.9 kg), SpO2 100 %.  LABORATORY DATA: Lab Results  Component Value Date   WBC 4.1 12/07/2018   HGB 13.1 12/07/2018   HCT 38.0 (L) 12/07/2018   MCV 100.8 (H) 12/07/2018   PLT 345 12/07/2018      Chemistry      Component Value Date/Time   NA 134 (L) 12/01/2018 1128   NA 130 (L) 03/05/2017 1110   K 4.2 12/01/2018 1128   CL 99 12/01/2018 1128   CO2 26 12/01/2018 1128   BUN 5 (L) 12/01/2018 1128   BUN 4 (L) 03/05/2017 1110   CREATININE 0.71 12/01/2018 1128   CREATININE 0.58 (L) 05/22/2015 1041      Component Value Date/Time   CALCIUM 8.5 (L) 12/01/2018 1128   ALKPHOS 61 12/01/2018 1128   AST 23 12/01/2018 1128   ALT 15 12/01/2018 1128   BILITOT <0.2 (L) 12/01/2018 1128       RADIOGRAPHIC STUDIES: No results found.  ASSESSMENT AND PLAN: This is a very pleasant 63 years old white male with a stage IIIb non-small cell lung cancer, adenocarcinoma with no actionable mutations status post right upper lobectomy with lymph node dissection under the care of [name]. The patient is currently undergoing adjuvant treatment with cisplatin and Alimta status post 2 cycles.   He tolerated the second cycle of his treatment well except for the fatigue and mild nausea.  He is presenting today with right lower quadrant abdominal pain with tenderness and rebound tenderness highly suspicious for appendicitis.  I recommended for the patient to go  immediately to the emergency department for further evaluation and consideration of CT of the abdomen to rule out appendicitis and surgical consultation if needed. If no concerning findings for appendicitis on the imaging studies and surgical evaluation, he will proceed with cycle #3 of his chemotherapy tomorrow as planned. I will see him back for follow-up visit in 3 weeks if he proceeds with the treatment tomorrow as planned. For the hypomagnesemia we will continue to monitor him closely and adjust his hydration fluids accordingly. The patient was advised to call immediately if he has any concerning symptoms in the interval. The patient voices understanding of current disease status and treatment options and is in agreement with the current care plan.  All questions were answered. The patient knows to call the clinic with any problems, questions or concerns. We can certainly see the patient much sooner if necessary.  I spent 10 minutes counseling the patient face to face. The total time spent in the appointment was 15 minutes.  Disclaimer: This note was dictated with voice recognition software. Similar sounding words can inadvertently be transcribed and may not be corrected upon review.

## 2018-12-08 ENCOUNTER — Encounter (HOSPITAL_COMMUNITY): Payer: Self-pay | Admitting: Surgery

## 2018-12-08 ENCOUNTER — Inpatient Hospital Stay: Payer: BLUE CROSS/BLUE SHIELD

## 2018-12-08 LAB — CBC
HCT: 33.7 % — ABNORMAL LOW (ref 39.0–52.0)
Hemoglobin: 11.8 g/dL — ABNORMAL LOW (ref 13.0–17.0)
MCH: 35.6 pg — ABNORMAL HIGH (ref 26.0–34.0)
MCHC: 35 g/dL (ref 30.0–36.0)
MCV: 101.8 fL — ABNORMAL HIGH (ref 80.0–100.0)
Platelets: 330 10*3/uL (ref 150–400)
RBC: 3.31 MIL/uL — ABNORMAL LOW (ref 4.22–5.81)
RDW: 15.8 % — ABNORMAL HIGH (ref 11.5–15.5)
WBC: 5.5 10*3/uL (ref 4.0–10.5)
nRBC: 0 % (ref 0.0–0.2)

## 2018-12-08 LAB — BASIC METABOLIC PANEL
Anion gap: 10 (ref 5–15)
BUN: 8 mg/dL (ref 8–23)
CO2: 23 mmol/L (ref 22–32)
Calcium: 8.6 mg/dL — ABNORMAL LOW (ref 8.9–10.3)
Chloride: 96 mmol/L — ABNORMAL LOW (ref 98–111)
Creatinine, Ser: 0.57 mg/dL — ABNORMAL LOW (ref 0.61–1.24)
GFR calc Af Amer: >60 mL/min (ref >60)
GFR calc non Af Amer: >60 mL/min (ref >60)
Glucose, Bld: 154 mg/dL — ABNORMAL HIGH (ref 70–99)
Potassium: 4.1 mmol/L (ref 3.5–5.1)
Sodium: 129 mmol/L — ABNORMAL LOW (ref 135–145)

## 2018-12-08 MED ORDER — OXYCODONE HCL 5 MG PO TABS: 5.0000 mg | ORAL_TABLET | Freq: Four times a day (QID) | ORAL | 0 refills | Status: DC | PRN

## 2018-12-08 MED ORDER — AMOXICILLIN-POT CLAVULANATE 875-125 MG PO TABS: 1.0000 | ORAL_TABLET | Freq: Two times a day (BID) | ORAL | 0 refills | Status: AC

## 2018-12-08 NOTE — Progress Notes (Signed)
Pt was discharged home today. Instructions were reviewed with patient, and questions were answered. Pt was taken to main entrance via wheelchair by NT.  

## 2018-12-08 NOTE — Discharge Instructions (Signed)
Appendicitis, Adult  Appendicitis is inflammation of the appendix. The appendix is a finger-shaped tube that is attached to the large intestine. If appendicitis is not treated, it can cause the appendix to tear (rupture). A ruptured appendix can lead to a life-threatening infection. It can also cause a painful collection of pus (abscess) to form in the appendix. What are the causes? This condition may be caused by a blockage in the appendix that leads to infection. The blockage can be caused by:  A ball of stool (feces).  Enlarged lymph glands. In some cases, the cause may not be known. What increases the risk? Age is a risk factor. You are more likely to develop this condition if you are between 43 and 59 years of age. What are the signs or symptoms? Symptoms of this condition include:  Pain that starts around the belly button and moves toward the lower right part of the abdomen. The pain can become more severe as time passes. It gets worse with coughing or sudden movements.  Tenderness in the lower right abdomen.  Nausea.  Vomiting.  Loss of appetite.  Fever.  Difficulty passing stool (constipation).  Passing very loose stools (diarrhea).  Generally feeling unwell. How is this diagnosed? This condition may be diagnosed with:  A physical exam.  Blood tests.  Urine test. To confirm the diagnosis, an ultrasound, MRI, or CT scan may be done. How is this treated? This condition is usually treated with surgery to remove the appendix (appendectomy). There are two methods for doing an appendectomy:  Open appendectomy. In this surgery, the appendix is removed through a large incision that is made in the lower right abdomen. This procedure may be recommended if: ? You have major scarring from a previous surgery. ? You have a bleeding disorder. ? You are pregnant and are about to give birth. ? You have a condition that makes it hard to do surgery through small incisions  (laparoscopic procedure). This includes severe infection or a ruptured appendix.  Laparoscopic appendectomy. In this surgery, the appendix is removed through small incisions. This procedure usually causes less pain and fewer problems than an open appendectomy. It also has a shorter recovery time. If the appendix has ruptured and an abscess has formed:  A drain may be placed into the abscess to remove fluid.  Antibiotic medicines may be given through an IV.  The appendix may or may not need to be removed. Follow these instructions at home: If you had surgery, follow instructions from your health care provider about how to care for yourself at home and how to care for your incision. Medicines  Take over-the-counter and prescription medicines only as told by your health care provider.  If you were prescribed an antibiotic medicine, take it as told by your health care provider. Do not stop taking the antibiotic even if you start to feel better. Eating and drinking  Follow instructions from your health care provider about eating restrictions. You may slowly resume a regular diet once your nausea or vomiting stops. General instructions  Do not use any products that contain nicotine or tobacco, such as cigarettes, e-cigarettes, and chewing tobacco. If you need help quitting, ask your health care provider.  Do not drive or use heavy machinery while taking prescription pain medicine.  Ask your health care provider if the medicine prescribed to you can cause constipation. You may need to take steps to prevent or treat constipation, such as: ? Drink enough fluid to keep your  urine pale yellow. ? Take over-the-counter or prescription medicines. ? Eat foods that are high in fiber, such as beans, whole grains, and fresh fruits and vegetables. ? Limit foods that are high in fat and processed sugars, such as fried or sweet foods.  Keep all follow-up visits as told by your health care provider. This  is important. Contact a health care provider if:  There is pus, blood, or excessive drainage coming from your incision.  You have nausea or vomiting. Get help right away if you have:  Worsening abdominal pain.  A fever.  Chills.  Fatigue.  Muscle aches.  Shortness of breath. Summary  Appendicitis is inflammation of the appendix.  This condition may be caused by a blockage in the appendix that leads to infection.  This condition is usually treated with surgery to remove the appendix. This information is not intended to replace advice given to you by your health care provider. Make sure you discuss any questions you have with your health care provider. Document Released: 07/15/2005 Document Revised: 12/31/2017 Document Reviewed: 12/31/2017 Elsevier Interactive Patient Education  2019 [place]: POST OP INSTRUCTIONS  1. DIET: Follow a light bland diet the first 24 hours after arrival home, such as soup, liquids, crackers, etc. Be sure to include lots of fluids daily. Avoid fast food or heavy meals as your are more likely to get nauseated. Eat a low fat the next few days after surgery.  2. Take your usually prescribed home medications unless otherwise directed. 3. PAIN CONTROL:  1. Pain is best controlled by a usual combination of three different methods TOGETHER:  1. Ice/Heat 2. Over the counter pain medication 3. Prescription pain medication 2. Most patients will experience some swelling and bruising around the incisions. Ice packs or heating pads (30-60 minutes up to 6 times a day) will help. Use ice for the first few days to help decrease swelling and bruising, then switch to heat to help relax tight/sore spots and speed recovery. Some people prefer to use ice alone, heat alone, alternating between ice & heat. Experiment to what works for you. Swelling and bruising can take several weeks to resolve.  3. It is helpful to take an  over-the-counter pain medication regularly for the first few weeks. Choose one of the following that works best for you:  1. Naproxen (Aleve, etc) Two 220mg  tabs twice a day 2. Ibuprofen (Advil, etc) Three 200mg  tabs four times a day (every meal & bedtime) 3. Acetaminophen (Tylenol, etc) 500-650mg  four times a day (every meal & bedtime) 4. A prescription for pain medication (such as oxycodone, hydrocodone, etc) should be given to you upon discharge. Take your pain medication as prescribed.  1. If you are having problems/concerns with the prescription medicine (does not control pain, nausea, vomiting, rash, itching, etc), please call us [phone] to see if we need to switch you to a different pain medicine that will work better for you and/or control your side effect better. 2. If you need a refill on your pain medication, please contact your pharmacy. They will contact our office to request authorization. Prescriptions will not be filled after 5 pm or on week-ends. 4. Avoid getting constipated. Between the surgery and the pain medications, it is common to experience some constipation. Increasing fluid intake and taking a fiber supplement (such as Metamucil, Citrucel, FiberCon, MiraLax, etc) 1-2 times a day regularly will usually help prevent this problem from occurring. A mild laxative (  prune juice, Milk of Magnesia, MiraLax, etc) should be taken according to package directions if there are no bowel movements after 48 hours.  5. Watch out for diarrhea. If you have many loose bowel movements, simplify your diet to bland foods & liquids for a few days. Stop any stool softeners and decrease your fiber supplement. Switching to mild anti-diarrheal medications (Kayopectate, Pepto Bismol) can help. If this worsens or does not improve, please call us. 6. Wash / shower every day. You may shower over the dressings as they are waterproof. Continue to shower over incision(s) after the dressing is off. 7. Remove  your waterproof bandages 5 days after surgery. You may leave the incision open to air. You may replace a dressing/Band-Aid to cover the incision for comfort if you wish.  8. ACTIVITIES as tolerated:  1. You may resume regular (light) daily activities beginning the next day--such as daily self-care, walking, climbing stairs--gradually increasing activities as tolerated. If you can walk 30 minutes without difficulty, it is safe to try more intense activity such as jogging, treadmill, bicycling, low-impact aerobics, swimming, etc. 2. Save the most intensive and strenuous activity for last such as sit-ups, heavy lifting, contact sports, etc Refrain from any heavy lifting or straining until you are off narcotics for pain control.  3. DO NOT PUSH THROUGH PAIN. Let pain be your guide: If it hurts to do something, don't do it. Pain is your body warning you to avoid that activity for another week until the pain goes down. 4. You may drive when you are no longer taking prescription pain medication, you can comfortably wear a seatbelt, and you can safely maneuver your car and apply brakes. 5. You may have sexual intercourse when it is comfortable.  9. FOLLOW UP in our office  1. Please call [facility] at [phone] to set up an appointment to see your surgeon in the office for a follow-up appointment approximately 2-3 weeks after your surgery. 2. Make sure that you call for this appointment the day you arrive home to insure a convenient appointment time.      10. IF YOU HAVE DISABILITY OR FAMILY LEAVE FORMS, BRING THEM TO THE               OFFICE FOR PROCESSING.   WHEN TO CALL us [phone]:  1. Poor pain control 2. Reactions / problems with new medications (rash/itching, nausea, etc)  3. Fever over 101.5 F (38.5 C) 4. Inability to urinate 5. Nausea and/or vomiting 6. Worsening swelling or bruising 7. Continued bleeding from incision. 8. Increased pain, redness, or drainage from the incision  The clinic  staff is available to answer your questions during regular business hours (8:30am-5pm). Please dont hesitate to call and ask to speak to one of our nurses for clinical concerns.  If you have a medical emergency, go to the nearest emergency room or call 911.  A surgeon from [facility] is always on call at the [facility], [place], [place], [place],  [address] ?  MAIN: [phone] ? TOLL FREE: [phone] ?  FAX [phone]  [email or web address]

## 2018-12-08 NOTE — Discharge Summary (Signed)
[facility] Surgery/Trauma Discharge Summary   Patient ID: Sean Rose MRN: [id number] DOB/AGE: Dec 26, 1955 63 y.o.  Admit date: 12/07/2018 Discharge date: 12/08/2018  Admitting Diagnosis: appendicitis  Discharge Diagnosis Patient Active Problem List   Diagnosis Date Noted  . Abdominal pain 12/07/2018  . Appendicitis 12/07/2018  . Adenocarcinoma of right lung, stage 3 ([place]) 10/13/2018  . Goals of care, counseling/discussion 10/13/2018  . Encounter for antineoplastic chemotherapy 10/13/2018  . S/P Right VATS with Right Upper Lobectomy, Lymph Node Sampling, Intercostal Nerve Block 09/14/2018  . Pneumothorax after biopsy 09/02/2018  . Stage 1 mild COPD by GOLD classification ([place]) 07/30/2018  . Syncope 07/23/2018  . Antiplatelet or antithrombotic long-term use 07/14/2018  . Nodule of upper lobe of right lung 07/14/2018  . Multiple lung nodules on CT 07/14/2018  . Nausea vomiting and diarrhea 10/30/2016  . Hyponatremia 10/30/2016  . Hypocalcemia 10/30/2016  . Hypokalemia 04/14/2015  . Hypomagnesemia 04/14/2015  . Claudication ([place]) 04/13/2015  . Essential hypertension 01/14/2014  . Hyperlipidemia 01/14/2014  . Thrombosed external hemorrhoids 12/07/2013  . Chronic anticoagulation 01/20/2013  . PVD (peripheral vascular disease) ([place]) 01/20/2013  . Coronary atherosclerosis of native coronary artery 01/20/2013  . GERD (gastroesophageal reflux disease) 01/20/2013  . Benign esophageal stricture 01/20/2013  . DENTAL CARIES 07/20/2010    Consultants none  Imaging: Ct Abdomen Pelvis W Contrast  Result Date: 12/07/2018 CLINICAL DATA:  Lung cancer, status post VATS. Now with right lower quadrant abdominal pain. EXAM: CT ABDOMEN AND PELVIS WITH CONTRAST TECHNIQUE: Multidetector CT imaging of the abdomen and pelvis was performed using the standard protocol following bolus administration of intravenous contrast. CONTRAST:  117mL OMNIPAQUE IOHEXOL 300 MG/ML  SOLN COMPARISON:   PET-CT dated 07/24/2018. FINDINGS: Lower chest: Lung bases are clear. Hepatobiliary: Liver is within normal limits. Gallbladder is unremarkable. No intrahepatic or extrahepatic ductal dilatation. Pancreas: Within normal limits. Spleen: Within normal limits. Adrenals/Urinary Tract: Thickening of the bilateral adrenal glands, without discrete mass. Kidneys are within normal limits.  No hydronephrosis. Bladder is within normal limits. Stomach/Bowel: Stomach is within normal limits. No evidence of bowel obstruction. Prominent appendix (coronal images 56 and 70), increased in diameter when compared to prior PET, but not frankly enlarged. No definite periappendiceal inflammatory changes or fluid. However, there is soft tissue thickening involving the cecum at the base of the appendix (coronal image 68). This overall appearance raises concern for early acute appendicitis in the appropriate clinical setting, but remains equivocal. Vascular/Lymphatic: No evidence of abdominal aortic aneurysm. Atherosclerotic calcifications of the abdominal aorta and branch vessels. Retroaortic left renal vein. No suspicious abdominopelvic lymphadenopathy. Reproductive: Prostate is unremarkable. Other: No abdominopelvic ascites. Musculoskeletal: Mild degenerative changes the lower thoracic spine. [address] IMPRESSION: Possible inflammatory changes involving the cecum at the base of the appendix. While equivocal, this raises concern for early acute appendicitis in the appropriate clinical setting. If intervention is not performed, consider short-term follow-up CT in 24-48 hours to assess for interval change. These results were called by telephone at the time of interpretation on 12/07/2018 at 3:32 pm to Dr. Coral Ceo , who verbally acknowledged these results. Electronically Signed   By: Julian Hy M.D.   On: 12/07/2018 15:33    Procedures Dr. Ninfa Linden (12/07/18) - Laparoscopic Appendectomy  HPI: Pt is a 63 yo male with a hx of  Stage IIIa (T3, N1, M0) non-small cell lung cancer, adenocarcinoma, S/P R upper lobectomy, COPD, Tobacco abuse, CAD, GERD, HTN, peripheral arterial disease who presented to the ED under recommendations of Dr.  Mohamed his oncologist. Pt saw Dr. [name] today for follow up of cancer treatment and told him about his abdominal pain. Had rebound tenderness on exam with Dr. [name]. Pt states pain started Saturday and was mild and only with movement. Worsened overnight then improved on Sunday. Pain was in the lower abdomen and is now concentrated to the RLQ, severe with movement, no pain at rest, does not radiate. Pain rated 5-6/10. Associated subjective fevers, diarrhea for 2 days prior to onset of pain and nausea. No vomiting. Pt is supposed to have chemo tomorrow.   Abdominal Surg Hx: R inguinal hernia repair On plavix  Hospital Course:  Workup showed appendicitis.  Patient was admitted and underwent procedure listed above.  Tolerated procedure well and was transferred to the floor.  Diet was advanced as tolerated.  On POD#1, the patient was voiding well, tolerating diet, ambulating well, pain well controlled, vital signs stable, incisions c/d/i and felt stable for discharge home.  Patient will follow up as outlined below and knows to call with questions or concerns.     Patient was discharged in good condition.  The [place] Substance controlled database was reviewed prior to prescribing narcotic pain medication to this patient.  Physical Exam: General:  Alert, NAD, pleasant, cooperative Cardio: RRR, S1 & S2 normal, no murmur, rubs, gallops Resp: Effort normal, lungs CTA bilaterally with mildly decreased breath sounds on the right, no wheezes, rales, rhonchi Abd:  Soft, ND, normal bowel sounds, very mild generalized tenderness without guarding, no peritonitis, incisions with glue intact were well appearing and were without drainage or bleeding  Skin: warm and dry, no rashes noted Extremities:  no TTP or swelling to calves b/l Neuro: alert and oriented x 3, moves all 4's  Allergies as of 12/08/2018      Reactions   Tyloxapol Nausea Only      Medication List    TAKE these medications   amLODipine 5 MG tablet Commonly known as:  NORVASC Take 1 tablet (5 mg total) by mouth daily.   amoxicillin-clavulanate 875-125 MG tablet Commonly known as:  Augmentin Take 1 tablet by mouth every 12 (twelve) hours for 5 days.   aspirin EC 81 MG tablet Take 81 mg by mouth daily.   clopidogrel 75 MG tablet Commonly known as:  PLAVIX TAKE 1 TABLET BY MOUTH EVERY DAY   dexamethasone 4 MG tablet Commonly known as:  DECADRON One tab po bid the day before, day of and day after chemo every 3 weeks.   diphenhydrAMINE 25 mg capsule Commonly known as:  BENADRYL Take 25 mg by mouth daily as needed for allergies.   folic acid 1 MG tablet Commonly known as:  FOLVITE Take 1 tablet (1 mg total) by mouth daily.   ibuprofen 200 MG tablet Commonly known as:  ADVIL Take 400 mg by mouth every 6 (six) hours as needed for headache or moderate pain.   Magnesium 250 MG Tabs Take 250 mg by mouth daily.   metoprolol succinate 100 MG 24 hr tablet Commonly known as:  TOPROL-XL TAKE 1 TABLET BY MOUTH EVERY DAY   multivitamin with minerals Tabs tablet Take 1 tablet by mouth daily.   nicotine 14 mg/24hr patch Commonly known as:  NICODERM CQ - dosed in mg/24 hours Place 1 patch (14 mg total) onto the skin daily.   oxyCODONE 5 MG immediate release tablet Commonly known as:  Oxy IR/ROXICODONE Take 1 tablet (5 mg total) by mouth every 6 (six) hours as needed  for moderate pain. What changed:    when to take this  reasons to take this   prochlorperazine 10 MG tablet Commonly known as:  COMPAZINE Take 1 tablet (10 mg total) by mouth every 6 (six) hours as needed for nausea or vomiting.   umeclidinium-vilanterol 62.5-25 MCG/INH Aepb Commonly known as:  Anoro Ellipta Inhale 1 puff into the lungs  daily.        Follow-up [facility] Follow up on 12/24/2018.   Specialty:  General Surgery Why:  a provider will call you this day. time to be determined. please take a photo of your incisions and email to [email or web address] 2 days prior to your appt. Please include name and date of birth in subject line  Contact information: [address] [place] [phone]          Signed: Fraser Din [facility] 12/08/2018, 9:39 AM Pager: [phone] Consults: [phone] Mon-Fri 7:00 am-4:30 pm Sat-Sun 7:00 am-11:30 am

## 2018-12-08 NOTE — Anesthesia Postprocedure Evaluation (Signed)
Anesthesia Post Note  Patient: Sean Rose  Procedure(s) Performed: APPENDECTOMY LAPAROSCOPIC (N/A )     Patient location during evaluation: PACU Anesthesia Type: General Level of consciousness: awake and alert Pain management: pain level controlled Vital Signs Assessment: post-procedure vital signs reviewed and stable Respiratory status: spontaneous breathing, nonlabored ventilation, respiratory function stable and patient connected to nasal cannula oxygen Cardiovascular status: blood pressure returned to baseline and stable Postop Assessment: no apparent nausea or vomiting Anesthetic complications: no    Last Vitals:  Vitals:   12/08/18 0221 12/08/18 0543  BP: 135/80 138/82  Pulse: 61 61  Resp: 14 15  Temp: 36.6 C 36.7 C  SpO2: 100% 100%    Last Pain:  Vitals:   12/08/18 0543  TempSrc: Oral  PainSc:                  Catalina Gravel

## 2018-12-09 ENCOUNTER — Telehealth: Payer: Self-pay | Admitting: Internal Medicine

## 2018-12-09 NOTE — Telephone Encounter (Signed)
appts already scheduled to the end of treatment plan per 5/11 los.

## 2018-12-14 ENCOUNTER — Telehealth: Payer: Self-pay | Admitting: Medical Oncology

## 2018-12-14 NOTE — Telephone Encounter (Signed)
Delayed chemo -He is concerned that if he waits 6 weeks for next chemo then he is not getting the  full benefit of tx.

## 2018-12-15 ENCOUNTER — Encounter: Payer: BLUE CROSS/BLUE SHIELD | Admitting: Thoracic Surgery (Cardiothoracic Vascular Surgery)

## 2018-12-15 ENCOUNTER — Inpatient Hospital Stay: Payer: BLUE CROSS/BLUE SHIELD

## 2018-12-15 NOTE — Telephone Encounter (Signed)
TCT patient regarding delay of his chemo d/t surgery on 12/07/18.  Explained that he needs time to heal from surgery, that getting chemo this soon after surgery has potential risks. Pt needs to heal from surgery before resuming his chemo.  Pt is worried that his treatment plan isn't going according to plan and asks is his cancer will get worse in the meantime and would Dr. [name] add on additional treatment(s) because of delay. Advised pt to discuss with Dr. [name] on his appt on 12/28/18.  Pt voiced understanding. Cancelled lab for today.  Do we cancel labs on 12/22/18 as well? Cancel Treatment on 12/29/18.

## 2018-12-15 NOTE — Telephone Encounter (Signed)
No lab next week. Keep lab and chemo on 6/1 and 6/2 Thank you

## 2018-12-15 NOTE — Telephone Encounter (Signed)
No. The risk of starting treatment sooner is worse. He needs to heal up first.

## 2018-12-16 NOTE — Telephone Encounter (Signed)
Cancelled appt but have not called pt back

## 2018-12-17 ENCOUNTER — Telehealth: Payer: Self-pay | Admitting: Medical Oncology

## 2018-12-17 NOTE — Telephone Encounter (Signed)
Called in error after seeing that his Lab appt on Tuesday was cancelled .

## 2018-12-22 ENCOUNTER — Other Ambulatory Visit: Payer: BLUE CROSS/BLUE SHIELD

## 2018-12-28 ENCOUNTER — Encounter: Payer: Self-pay | Admitting: Internal Medicine

## 2018-12-28 ENCOUNTER — Inpatient Hospital Stay: Payer: BC Managed Care – PPO | Attending: Internal Medicine | Admitting: Internal Medicine

## 2018-12-28 ENCOUNTER — Inpatient Hospital Stay: Payer: BC Managed Care – PPO

## 2018-12-28 ENCOUNTER — Other Ambulatory Visit: Payer: Self-pay

## 2018-12-28 VITALS — BP 145/97 | HR 74 | Temp 98.7°F | Resp 20 | Ht 68.0 in | Wt 128.6 lb

## 2018-12-28 DIAGNOSIS — R5383 Other fatigue: Secondary | ICD-10-CM | POA: Diagnosis not present

## 2018-12-28 DIAGNOSIS — Z902 Acquired absence of lung [part of]: Secondary | ICD-10-CM | POA: Diagnosis not present

## 2018-12-28 DIAGNOSIS — C3411 Malignant neoplasm of upper lobe, right bronchus or lung: Secondary | ICD-10-CM | POA: Diagnosis not present

## 2018-12-28 DIAGNOSIS — I10 Essential (primary) hypertension: Secondary | ICD-10-CM

## 2018-12-28 DIAGNOSIS — Z79899 Other long term (current) drug therapy: Secondary | ICD-10-CM | POA: Diagnosis not present

## 2018-12-28 DIAGNOSIS — Z5111 Encounter for antineoplastic chemotherapy: Secondary | ICD-10-CM | POA: Diagnosis not present

## 2018-12-28 DIAGNOSIS — C3491 Malignant neoplasm of unspecified part of right bronchus or lung: Secondary | ICD-10-CM

## 2018-12-28 LAB — CBC WITH DIFFERENTIAL (CANCER CENTER ONLY)
Abs Immature Granulocytes: 0.04 10*3/uL (ref 0.00–0.07)
Basophils Absolute: 0 10*3/uL (ref 0.0–0.1)
Basophils Relative: 0 %
Eosinophils Absolute: 0 10*3/uL (ref 0.0–0.5)
Eosinophils Relative: 0 %
HCT: 40 % (ref 39.0–52.0)
Hemoglobin: 14 g/dL (ref 13.0–17.0)
Immature Granulocytes: 0 %
Lymphocytes Relative: 7 %
Lymphs Abs: 0.6 10*3/uL — ABNORMAL LOW (ref 0.7–4.0)
MCH: 35.3 pg — ABNORMAL HIGH (ref 26.0–34.0)
MCHC: 35 g/dL (ref 30.0–36.0)
MCV: 100.8 fL — ABNORMAL HIGH (ref 80.0–100.0)
Monocytes Absolute: 0.1 10*3/uL (ref 0.1–1.0)
Monocytes Relative: 1 %
Neutro Abs: 8.9 10*3/uL — ABNORMAL HIGH (ref 1.7–7.7)
Neutrophils Relative %: 92 %
Platelet Count: 311 10*3/uL (ref 150–400)
RBC: 3.97 MIL/uL — ABNORMAL LOW (ref 4.22–5.81)
RDW: 15.3 % (ref 11.5–15.5)
WBC Count: 9.6 10*3/uL (ref 4.0–10.5)
nRBC: 0 % (ref 0.0–0.2)

## 2018-12-28 LAB — CMP (CANCER CENTER ONLY)
ALT: 13 U/L (ref 0–44)
AST: 23 U/L (ref 15–41)
Albumin: 4.2 g/dL (ref 3.5–5.0)
Alkaline Phosphatase: 74 U/L (ref 38–126)
Anion gap: 10 (ref 5–15)
BUN: 5 mg/dL — ABNORMAL LOW (ref 8–23)
CO2: 25 mmol/L (ref 22–32)
Calcium: 9.6 mg/dL (ref 8.9–10.3)
Chloride: 94 mmol/L — ABNORMAL LOW (ref 98–111)
Creatinine: 0.77 mg/dL (ref 0.61–1.24)
GFR, Est AFR Am: >60 mL/min (ref >60)
GFR, Estimated: >60 mL/min (ref >60)
Glucose, Bld: 153 mg/dL — ABNORMAL HIGH (ref 70–99)
Potassium: 4.3 mmol/L (ref 3.5–5.1)
Sodium: 129 mmol/L — ABNORMAL LOW (ref 135–145)
Total Bilirubin: 0.6 mg/dL (ref 0.3–1.2)
Total Protein: 7.6 g/dL (ref 6.5–8.1)

## 2018-12-28 LAB — MAGNESIUM: Magnesium: 1.6 mg/dL — ABNORMAL LOW (ref 1.7–2.4)

## 2018-12-28 NOTE — Progress Notes (Signed)
Went to lobby to introduce myself as Arboriculturist and to offer available resources. Patient has 2 insurances therefore copay assistance is not needed.  Discussed one-time $700 [place] to assist wit personal expenses while going through treatment. Patient declined.  Gave him my card for any additional financial questions or concerns.

## 2018-12-28 NOTE — Progress Notes (Signed)
[place] [phone]   [phone]) [phone]  OFFICE PROGRESS NOTE  [name], NP [place] [address]  DIAGNOSIS: Stage IIIa (T3, N1, M0) non-small cell lung cancer, adenocarcinoma with no actionable mutations presented with multiple pulmonary nodules in the right upper lobe as well as metastatic disease in intraparenchymal lymphadenopathy.  This was diagnosed in February 2020.  PRIOR THERAPY: Status post right upper lobectomy with lymph node dissection.  CURRENT THERAPY: Adjuvant systemic chemotherapy with cisplatin 75 mg/M2 and Alimta 500 mg/M2 every 3 weeks.  Status post 2 cycles.  INTERVAL HISTORY: Sean Rose 63 y.o. male returns to the clinic today for follow-up visit.  The start of cycle #3 was delayed by 3 weeks because the patient was diagnosed with acute appendicitis 3 weeks ago.  He has the last 3 weeks to recover from his surgery.  He is feeling much better today.  He denied having any current chest pain, shortness of breath, cough or hemoptysis.  He denied having any fever or chills.  He has no nausea, vomiting, diarrhea or constipation.  He is here today for evaluation before starting cycle #3 tomorrow.  MEDICAL HISTORY: Past Medical History:  Diagnosis Date  . Anginal pain ([place])   . CAD (coronary artery disease)   . CAP (community acquired pneumonia) 09/2016  . COPD (chronic obstructive pulmonary disease) ([place])   . GERD (gastroesophageal reflux disease)   . Heart murmur    "I was told I had one when I was a kid"  . Hemorrhoids   . History of anal fissures    "no surgeries" (10/30/2016)  . Hyperlipidemia   . Hypertension   . Peripheral arterial disease ([place])    status post right common iliac artery stenting back in 2007  . Seasonal allergies   . Tobacco abuse     ALLERGIES:  is allergic to tyloxapol.  MEDICATIONS:  Current Outpatient Medications  Medication Sig Dispense Refill  . amLODipine  (NORVASC) 5 MG tablet Take 1 tablet (5 mg total) by mouth daily. 30 tablet 1  . aspirin EC 81 MG tablet Take 81 mg by mouth daily.    . clopidogrel (PLAVIX) 75 MG tablet TAKE 1 TABLET BY MOUTH EVERY DAY (Patient taking differently: Take 75 mg by mouth daily. ) 90 tablet 1  . dexamethasone (DECADRON) 4 MG tablet One tab po bid the day before, day of and day after chemo every 3 weeks. 40 tablet 0  . diphenhydrAMINE (BENADRYL) 25 mg capsule Take 25 mg by mouth daily as needed for allergies.     . folic acid (FOLVITE) 1 MG tablet Take 1 tablet (1 mg total) by mouth daily. 30 tablet 4  . ibuprofen (ADVIL,MOTRIN) 200 MG tablet Take 400 mg by mouth every 6 (six) hours as needed for headache or moderate pain.     . Magnesium 250 MG TABS Take 250 mg by mouth daily.     . metoprolol succinate (TOPROL-XL) 100 MG 24 hr tablet TAKE 1 TABLET BY MOUTH EVERY DAY (Patient taking differently: Take 100 mg by mouth daily. ) 90 tablet 1  . Multiple Vitamin (MULTIVITAMIN WITH MINERALS) TABS tablet Take 1 tablet by mouth daily.    . nicotine (NICODERM CQ - DOSED IN MG/24 HOURS) 14 mg/24hr patch Place 1 patch (14 mg total) onto the skin daily. 28 patch 0  . oxyCODONE (OXY IR/ROXICODONE) 5 MG immediate release tablet Take 1 tablet (5 mg total) by  mouth every 6 (six) hours as needed for moderate pain. 20 tablet 0  . prochlorperazine (COMPAZINE) 10 MG tablet Take 1 tablet (10 mg total) by mouth every 6 (six) hours as needed for nausea or vomiting. (Patient not taking: Reported on 12/07/2018) 30 tablet 0  . umeclidinium-vilanterol (ANORO ELLIPTA) 62.5-25 MCG/INH AEPB Inhale 1 puff into the lungs daily. 60 each 3   No current facility-administered medications for this visit.     SURGICAL HISTORY:  Past Surgical History:  Procedure Laterality Date  . ANKLE SURGERY Left    "rebuilt it"  . ANTERIOR CRUCIATE LIGAMENT REPAIR Right   . CARDIAC CATHETERIZATION  09/23/2008   Continued medical therapy - may need GI evaluation in  addition.  Marland Kitchen CARDIAC CATHETERIZATION  10/28/2007   Medical therapy recommended.  Marland Kitchen CARDIAC CATHETERIZATION  11/18/2006   In-stent restenosis RCA  (50% distal edge, 80% segmental mid, and 50-60% segmental proximal). Successful cutting balloon atherectomy using a [id number] cutting balloon. 3 inflations with atherectomy performed on mid and proximal portions resulting in reduction of 80% mid in-stent restenosis to less than 20% residual and 50-60% segmental proximal to less than 20% residual without dissection.  Marland Kitchen CARDIAC CATHETERIZATION  02/26/2006   Severe stenosis in RCA. Stenting performed using IVUS. 3.5x20 Maverick balloon deployed at [employer]. Distal stent-a 4x28 Liberte stent-deployed 12atm 48sec, 12atm 31sec, 4atm 19sec. Mid stent-a 4x28 Liberte stent-deployed 14atm 45sec, 14atm 60sec, 14atm 44sec. Proximal stent-4x8 Liberte- 14atm 45sec,14atm 47sec, 16atm 43sec. Severely diseased segment then appeared TIMI-3 flow.  Marland Kitchen CARDIOVASCULAR STRESS TEST  11/17/2012   No significant ECG changes. Septal perfusion defect is new when complared to study from 2010. Abnormal myocardial perfusion imaging with a basal to mid perfusion suggestive of previous MI.  Marland Kitchen CAROTID DOPPLER  08/09/2011   Bilateral Bulb/Proximal ICA - demonstrated a mild amount of fibrous plaque without evidence of significant diameter reduction reduction or other vascular abnormality.  . CHEST TUBE INSERTION Right 09/02/2018   Procedure: Chest Tube Insertion;  Surgeon: Garner Nash, DO;  Location: [facility];  Service: Thoracic;  Laterality: Right;  . COLONOSCOPY     2003, 2014  . CORONARY ANGIOPLASTY WITH STENT PLACEMENT    . FEMORAL ARTERY STENT    . INGUINAL HERNIA REPAIR Right   . KNEE ARTHROSCOPY Right "multiple"  . LAPAROSCOPIC APPENDECTOMY N/A 12/07/2018   Procedure: APPENDECTOMY LAPAROSCOPIC;  Surgeon: Coralie Keens, MD;  Location: [facility];  Service: General;  Laterality: N/A;  . LOWER EXTREMITY ARTERIAL DOPPLER  01/31/2011   Bilateral  ABIs-normal values with no suggestion of arterial insuff to the lower extremities at rest. Right CIA stent-mild amount of nonhemodynamically significant plaque is noted throughout  . 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 PERCUTANEOUS STENT INTERVENTION  04/04/2006 & 04/13/2015   a. Right common iliac artery with an 8.0x18 mm Herculink stent deployed at 12 atm. Stenosis was reduced from 80% to 0% with brisk flow. b. I-cast stenting to left common iliac artery  . PERIPHERAL VASCULAR CATHETERIZATION N/A 04/13/2015   Procedure: Lower Extremity Angiography;  Surgeon: Lorretta Harp, MD; L-oCIA 75%, 40-50% L-EIA, R-CIA stent patent, s/p 8 mm x 38 mm ICast covered stent>>0% stenosis in L-oCIA     . SHOULDER ARTHROSCOPY WITH ROTATOR CUFF REPAIR Right   . TRANSTHORACIC ECHOCARDIOGRAM  11/26/2012   EF not noted. Aortic valve-sclerosis without stenosis, no regurgiation.   Marland Kitchen UPPER GASTROINTESTINAL ENDOSCOPY    . US CAROTID DOPPLER BILATERAL ([place] [facility])  08/09/2011  Bilateral Bulb/Proximal ICAa demonstrated a mild amount of fibrous plaque without evidence of significant diameter reduction or any other vascular abnormality.  Marland Kitchen VIDEO ASSISTED THORACOSCOPY (VATS)/ LOBECTOMY Right 09/14/2018   Procedure: RIGHT VIDEO ASSISTED THORACOSCOPY (VATS)/ RIGHT UPPER LOBECTOMY;  Surgeon: Melrose Nakayama, MD;  Location: [facility];  Service: Thoracic;  Laterality: Right;  Marland Kitchen VIDEO BRONCHOSCOPY WITH ENDOBRONCHIAL NAVIGATION N/A 09/02/2018   Procedure: VIDEO BRONCHOSCOPY WITH ENDOBRONCHIAL NAVIGATION;  Surgeon: Garner Nash, DO;  Location: [facility];  Service: Thoracic;  Laterality: N/A;    REVIEW OF SYSTEMS:  A comprehensive review of systems was negative.   PHYSICAL EXAMINATION: General appearance: alert, cooperative and no distress Head: Normocephalic, without obvious abnormality, atraumatic Neck: no adenopathy, no JVD, supple, symmetrical, trachea midline and thyroid not enlarged, symmetric, no  tenderness/mass/nodules Lymph nodes: Cervical, supraclavicular, and axillary nodes normal. Resp: clear to auscultation bilaterally Back: symmetric, no curvature. ROM normal. No CVA tenderness. Cardio: regular rate and rhythm, S1, S2 normal, no murmur, click, rub or gallop GI: soft, non-tender; bowel sounds normal; no masses,  no organomegaly Extremities: extremities normal, atraumatic, no cyanosis or edema  ECOG PERFORMANCE STATUS: 1 - Symptomatic but completely ambulatory  Blood pressure (!) 145/97, pulse 74, temperature 98.7 F (37.1 C), temperature source Oral, resp. rate 20, height '5\' 8"'  (1.727 m), weight 128 lb 9.6 oz (58.3 kg), SpO2 100 %.  LABORATORY DATA: Lab Results  Component Value Date   WBC 9.6 12/28/2018   HGB 14.0 12/28/2018   HCT 40.0 12/28/2018   MCV 100.8 (H) 12/28/2018   PLT 311 12/28/2018      Chemistry      Component Value Date/Time   NA 129 (L) 12/08/2018 0418   NA 130 (L) 03/05/2017 1110   K 4.1 12/08/2018 0418   CL 96 (L) 12/08/2018 0418   CO2 23 12/08/2018 0418   BUN 8 12/08/2018 0418   BUN 4 (L) 03/05/2017 1110   CREATININE 0.57 (L) 12/08/2018 0418   CREATININE 0.81 12/07/2018 1047   CREATININE 0.58 (L) 05/22/2015 1041      Component Value Date/Time   CALCIUM 8.6 (L) 12/08/2018 0418   ALKPHOS 81 12/07/2018 1047   AST 19 12/07/2018 1047   ALT 14 12/07/2018 1047   BILITOT 0.6 12/07/2018 1047       RADIOGRAPHIC STUDIES: Ct Abdomen Pelvis W Contrast  Result Date: 12/07/2018 CLINICAL DATA:  Lung cancer, status post VATS. Now with right lower quadrant abdominal pain. EXAM: CT ABDOMEN AND PELVIS WITH CONTRAST TECHNIQUE: Multidetector CT imaging of the abdomen and pelvis was performed using the standard protocol following bolus administration of intravenous contrast. CONTRAST:  155m OMNIPAQUE IOHEXOL 300 MG/ML  SOLN COMPARISON:  PET-CT dated 07/24/2018. FINDINGS: Lower chest: Lung bases are clear. Hepatobiliary: Liver is within normal limits.  Gallbladder is unremarkable. No intrahepatic or extrahepatic ductal dilatation. Pancreas: Within normal limits. Spleen: Within normal limits. Adrenals/Urinary Tract: Thickening of the bilateral adrenal glands, without discrete mass. Kidneys are within normal limits.  No hydronephrosis. Bladder is within normal limits. Stomach/Bowel: Stomach is within normal limits. No evidence of bowel obstruction. Prominent appendix (coronal images 56 and 70), increased in diameter when compared to prior PET, but not frankly enlarged. No definite periappendiceal inflammatory changes or fluid. However, there is soft tissue thickening involving the cecum at the base of the appendix (coronal image 68). This overall appearance raises concern for early acute appendicitis in the appropriate clinical setting, but remains equivocal. Vascular/Lymphatic: No evidence of abdominal aortic aneurysm. Atherosclerotic calcifications  of the abdominal aorta and branch vessels. Retroaortic left renal vein. No suspicious abdominopelvic lymphadenopathy. Reproductive: Prostate is unremarkable. Other: No abdominopelvic ascites. Musculoskeletal: Mild degenerative changes the lower thoracic spine. [id number] IMPRESSION: Possible inflammatory changes involving the cecum at the base of the appendix. While equivocal, this raises concern for early acute appendicitis in the appropriate clinical setting. If intervention is not performed, consider short-term follow-up CT in 24-48 hours to assess for interval change. These results were called by telephone at the time of interpretation on 12/07/2018 at 3:32 pm to Dr. Coral Ceo , who verbally acknowledged these results. Electronically Signed   By: Julian Hy M.D.   On: 12/07/2018 15:33    ASSESSMENT AND PLAN: This is a very pleasant 63 years old white male with a stage IIIb non-small cell lung cancer, adenocarcinoma with no actionable mutations status post right upper lobectomy with lymph node dissection  under the care of [name]. The patient is currently undergoing adjuvant treatment with cisplatin and Alimta status post 2 cycles.   He has been tolerating this treatment well except for fatigue. Cycle #3 was delayed by 3 weeks because of the recent surgery for acute appendicitis. The patient is feeling much better today and he is ready to resume his treatment. He will proceed with cycle #3 tomorrow as planned. I will see him back for follow-up visit in 3 weeks for evaluation before starting cycle #4. The patient was advised to call immediately if he has any concerning symptoms in the interval. The patient voices understanding of current disease status and treatment options and is in agreement with the current care plan. All questions were answered. The patient knows to call the clinic with any problems, questions or concerns. We can certainly see the patient much sooner if necessary.  I spent 10 minutes counseling the patient face to face. The total time spent in the appointment was 15 minutes.  Disclaimer: This note was dictated with voice recognition software. Similar sounding words can inadvertently be transcribed and may not be corrected upon review.

## 2018-12-29 ENCOUNTER — Ambulatory Visit (HOSPITAL_BASED_OUTPATIENT_CLINIC_OR_DEPARTMENT_OTHER): Payer: BC Managed Care – PPO | Admitting: Medical

## 2018-12-29 ENCOUNTER — Telehealth: Payer: Self-pay | Admitting: Internal Medicine

## 2018-12-29 ENCOUNTER — Other Ambulatory Visit: Payer: Self-pay

## 2018-12-29 ENCOUNTER — Inpatient Hospital Stay: Payer: BC Managed Care – PPO

## 2018-12-29 VITALS — BP 157/82 | HR 65 | Temp 98.8°F | Resp 16

## 2018-12-29 DIAGNOSIS — R5383 Other fatigue: Secondary | ICD-10-CM | POA: Diagnosis not present

## 2018-12-29 DIAGNOSIS — Z5111 Encounter for antineoplastic chemotherapy: Secondary | ICD-10-CM | POA: Diagnosis not present

## 2018-12-29 DIAGNOSIS — Z79899 Other long term (current) drug therapy: Secondary | ICD-10-CM | POA: Diagnosis not present

## 2018-12-29 DIAGNOSIS — C3491 Malignant neoplasm of unspecified part of right bronchus or lung: Secondary | ICD-10-CM

## 2018-12-29 DIAGNOSIS — Z902 Acquired absence of lung [part of]: Secondary | ICD-10-CM | POA: Diagnosis not present

## 2018-12-29 DIAGNOSIS — C3411 Malignant neoplasm of upper lobe, right bronchus or lung: Secondary | ICD-10-CM | POA: Diagnosis not present

## 2018-12-29 DIAGNOSIS — T80818A Extravasation of other vesicant agent, initial encounter: Secondary | ICD-10-CM

## 2018-12-29 MED ORDER — CYANOCOBALAMIN 1000 MCG/ML IJ SOLN
INTRAMUSCULAR | Status: AC
  Filled 2018-12-29: qty 1

## 2018-12-29 MED ORDER — PALONOSETRON HCL INJECTION 0.25 MG/5ML
INTRAVENOUS | Status: AC
  Filled 2018-12-29: qty 5

## 2018-12-29 MED ORDER — SODIUM CHLORIDE 0.9% FLUSH
10.0000 mL | INTRAVENOUS | Status: DC | PRN
  Filled 2018-12-29: qty 10

## 2018-12-29 MED ORDER — SODIUM CHLORIDE 0.9 % IV SOLN
75.0000 mg/m2 | Freq: Once | INTRAVENOUS | Status: AC
  Administered 2018-12-29: 122 mg via INTRAVENOUS
  Filled 2018-12-29: qty 122

## 2018-12-29 MED ORDER — CYANOCOBALAMIN 1000 MCG/ML IJ SOLN
1000.0000 ug | Freq: Once | INTRAMUSCULAR | Status: AC
  Administered 2018-12-29: 1000 ug via INTRAMUSCULAR

## 2018-12-29 MED ORDER — SODIUM CHLORIDE 0.9 % IV SOLN
500.0000 mg/m2 | Freq: Once | INTRAVENOUS | Status: AC
  Administered 2018-12-29: 800 mg via INTRAVENOUS
  Filled 2018-12-29: qty 32

## 2018-12-29 MED ORDER — POTASSIUM CHLORIDE 2 MEQ/ML IV SOLN
Freq: Once | INTRAVENOUS | Status: AC
  Administered 2018-12-29: 10:00:00 via INTRAVENOUS
  Filled 2018-12-29: qty 10

## 2018-12-29 MED ORDER — PALONOSETRON HCL INJECTION 0.25 MG/5ML
0.2500 mg | Freq: Once | INTRAVENOUS | Status: AC
  Administered 2018-12-29: 0.25 mg via INTRAVENOUS

## 2018-12-29 MED ORDER — SODIUM CHLORIDE 0.9 % IV SOLN
Freq: Once | INTRAVENOUS | Status: AC
  Administered 2018-12-29: 09:00:00 via INTRAVENOUS
  Filled 2018-12-29: qty 250

## 2018-12-29 MED ORDER — SODIUM CHLORIDE 0.9 % IV SOLN
Freq: Once | INTRAVENOUS | Status: AC
  Administered 2018-12-29: 12:00:00 via INTRAVENOUS
  Filled 2018-12-29: qty 5

## 2018-12-29 MED ORDER — HEPARIN SOD (PORK) LOCK FLUSH 100 UNIT/ML IV SOLN
500.0000 [IU] | Freq: Once | INTRAVENOUS | Status: DC | PRN
  Filled 2018-12-29: qty 5

## 2018-12-29 NOTE — Progress Notes (Signed)
Noticed swelling (about golf ball size) at pt's IV site while D/Cing pt's IV. Asked pt when if he noticed the swelling and if area was painful. Pt stated he did not notice any swelling earlier and that the area was not painful. Alfredia Client notified and came to assess pt in treatment area. Given the size of swelling and lack of pain it was determined the extravazation likely occurred during the last 30 min of post-hydration fluids (and not during any chemotherapy administration). Van instructed pt to put ice on area to help with swelling and call clinic if pt noticed any signs of infection at puncture site. Pt verbalized understanding and agreement, and ambulated out of clinic without incident.

## 2018-12-29 NOTE — Patient Instructions (Signed)
[facility] Discharge Instructions for Patients Receiving Chemotherapy  Today you received the following chemotherapy agents Alimta, Cisplatin  To help prevent nausea and vomiting after your treatment, we encourage you to take your nausea medication as directed by your MD   If you develop nausea and vomiting that is not controlled by your nausea medication, call the clinic.   BELOW ARE SYMPTOMS THAT SHOULD BE REPORTED IMMEDIATELY:  *FEVER GREATER THAN 100.5 F  *CHILLS WITH OR WITHOUT FEVER  NAUSEA AND VOMITING THAT IS NOT CONTROLLED WITH YOUR NAUSEA MEDICATION  *UNUSUAL SHORTNESS OF BREATH  *UNUSUAL BRUISING OR BLEEDING  TENDERNESS IN MOUTH AND THROAT WITH OR WITHOUT PRESENCE OF ULCERS  *URINARY PROBLEMS  *BOWEL PROBLEMS  UNUSUAL RASH Items with * indicate a potential emergency and should be followed up as soon as possible.  Feel free to call the clinic should you have any questions or concerns. The clinic phone number is [phone].  Please show the [place] at check-in to the Emergency Department and triage nurse.  Coronavirus (COVID-19) Are you at risk?  Are you at risk for the Coronavirus (COVID-19)?  To be considered HIGH RISK for Coronavirus (COVID-19), you have to meet the following criteria:  . Traveled to [place], [place], [place], [place] or [place]; or in the [place] to [place], [place], [place], or [place]; and have fever, cough, and shortness of breath within the last 2 weeks of travel OR . Been in close contact with a person diagnosed with COVID-19 within the last 2 weeks and have fever, cough, and shortness of breath . IF YOU DO NOT MEET THESE CRITERIA, YOU ARE CONSIDERED LOW RISK FOR COVID-19.  What to do if you are HIGH RISK for COVID-19?  Marland Kitchen If you are having a medical emergency, call 911. . Seek medical care right away. Before you go to a doctor's office, urgent care or emergency department, call ahead and tell  them about your recent travel, contact with someone diagnosed with COVID-19, and your symptoms. You should receive instructions from your physician's office regarding next steps of care.  . When you arrive at healthcare provider, tell the healthcare staff immediately you have returned from visiting [place], [place], [place], [place] or [place]; or traveled in the [place] to [place], [place], [place], or [place]; in the last two weeks or you have been in close contact with a person diagnosed with COVID-19 in the last 2 weeks.   . Tell the health care staff about your symptoms: fever, cough and shortness of breath. . After you have been seen by a medical provider, you will be either: o Tested for (COVID-19) and discharged home on quarantine except to seek medical care if symptoms worsen, and asked to  - Stay home and avoid contact with others until you get your results (4-5 days)  - Avoid travel on public transportation if possible (such as bus, train, or airplane) or o Sent to the Emergency Department by EMS for evaluation, COVID-19 testing, and possible admission depending on your condition and test results.  What to do if you are LOW RISK for COVID-19?  Reduce your risk of any infection by using the same precautions used for avoiding the common cold or flu:  Marland Kitchen Wash your hands often with soap and warm water for at least 20 seconds.  If soap and water are not readily available, use an alcohol-based hand sanitizer with at least 60% alcohol.  Marland Kitchen  If coughing or sneezing, cover your mouth and nose by coughing or sneezing into the elbow areas of your shirt or coat, into a tissue or into your sleeve (not your hands). . Avoid shaking hands with others and consider head nods or verbal greetings only. . Avoid touching your eyes, nose, or mouth with unwashed hands.  . Avoid close contact with people who are sick. . Avoid places or events with large numbers of people in one location, like concerts or  sporting events. . Carefully consider travel plans you have or are making. . If you are planning any travel outside or inside the [place], visit the CDC's Travelers' Health webpage for the latest health notices. . If you have some symptoms but not all symptoms, continue to monitor at home and seek medical attention if your symptoms worsen. . If you are having a medical emergency, call 911.   [place] / e-Visit: [email or web address]         [facility]: [facility]: [phone]                   [facility]: [phone]

## 2018-12-29 NOTE — Telephone Encounter (Signed)
Scheduled appt per 6/1 los - pt to get an updated schedule in treatment area

## 2018-12-30 NOTE — Progress Notes (Signed)
   DATE: 12/29/2018     IV EXTRAVASATION (Neutral)  MD: Dr. Fanny Bien. Mohamed  AGENT RECEIVED AT TIME OF EXTRAVASATION:   Normal Saline (occurred at the end of 2 hour post cisplatin hydration)  IV SITE LOCATION: Right medial forearm  INTERVENTION:  1) IV stopped  2) 0 ml liquid/blood aspirated from IV site.  3) Patient Teaching Instruction Sheet reviewed with Sean Rose.  A) Apply cold pack to area for 15 to 20 minutes 4 times daily for the next 48 hours B) Elevate the affected site for the next 48 hours. C) Return as needed for evaluation.  D) Sean Rose was instructed to call [phone] if he has questions or notes acute changes to the area.  Review of Systems  Cardiovascular: Negative for chest pain.  Skin:       Induration of the right medial forearm.    Physical Exam Constitutional:      General: He is not in acute distress.    Appearance: Normal appearance. He is not ill-appearing.  Cardiovascular:     Rate and Rhythm: Normal rate and regular rhythm.  Pulmonary:     Effort: Pulmonary effort is normal.     Breath sounds: Normal breath sounds.  Skin:    General: Skin is warm and dry.     Comments: Swelling of the right medial forearm.  The area is proximal to an IV insertion site.  Neurological:     Mental Status: He is alert.  Psychiatric:        Mood and Affect: Mood normal.        Behavior: Behavior normal.        Thought Content: Thought content normal.        Judgment: Judgment normal.          Sandi Mealy, MHS, PA-C

## 2019-01-04 ENCOUNTER — Inpatient Hospital Stay: Payer: BC Managed Care – PPO

## 2019-01-04 ENCOUNTER — Other Ambulatory Visit: Payer: Self-pay

## 2019-01-04 DIAGNOSIS — Z902 Acquired absence of lung [part of]: Secondary | ICD-10-CM | POA: Diagnosis not present

## 2019-01-04 DIAGNOSIS — R5383 Other fatigue: Secondary | ICD-10-CM | POA: Diagnosis not present

## 2019-01-04 DIAGNOSIS — Z5111 Encounter for antineoplastic chemotherapy: Secondary | ICD-10-CM | POA: Diagnosis not present

## 2019-01-04 DIAGNOSIS — C3411 Malignant neoplasm of upper lobe, right bronchus or lung: Secondary | ICD-10-CM | POA: Diagnosis not present

## 2019-01-04 DIAGNOSIS — Z79899 Other long term (current) drug therapy: Secondary | ICD-10-CM | POA: Diagnosis not present

## 2019-01-04 DIAGNOSIS — C3491 Malignant neoplasm of unspecified part of right bronchus or lung: Secondary | ICD-10-CM

## 2019-01-04 LAB — CBC WITH DIFFERENTIAL (CANCER CENTER ONLY)
Abs Immature Granulocytes: 0.02 K/uL (ref 0.00–0.07)
Basophils Absolute: 0 K/uL (ref 0.0–0.1)
Basophils Relative: 1 %
Eosinophils Absolute: 0.1 K/uL (ref 0.0–0.5)
Eosinophils Relative: 3 %
HCT: 35 % — ABNORMAL LOW (ref 39.0–52.0)
Hemoglobin: 12.3 g/dL — ABNORMAL LOW (ref 13.0–17.0)
Immature Granulocytes: 0 %
Lymphocytes Relative: 41 %
Lymphs Abs: 2 K/uL (ref 0.7–4.0)
MCH: 35.8 pg — ABNORMAL HIGH (ref 26.0–34.0)
MCHC: 35.1 g/dL (ref 30.0–36.0)
MCV: 101.7 fL — ABNORMAL HIGH (ref 80.0–100.0)
Monocytes Absolute: 0.5 K/uL (ref 0.1–1.0)
Monocytes Relative: 10 %
Neutro Abs: 2.2 K/uL (ref 1.7–7.7)
Neutrophils Relative %: 45 %
Platelet Count: 232 K/uL (ref 150–400)
RBC: 3.44 MIL/uL — ABNORMAL LOW (ref 4.22–5.81)
RDW: 14.4 % (ref 11.5–15.5)
WBC Count: 4.9 K/uL (ref 4.0–10.5)
nRBC: 0 % (ref 0.0–0.2)

## 2019-01-04 LAB — CMP (CANCER CENTER ONLY)
ALT: 22 U/L (ref 0–44)
AST: 30 U/L (ref 15–41)
Albumin: 3.9 g/dL (ref 3.5–5.0)
Alkaline Phosphatase: 55 U/L (ref 38–126)
Anion gap: 10 (ref 5–15)
BUN: 7 mg/dL — ABNORMAL LOW (ref 8–23)
CO2: 26 mmol/L (ref 22–32)
Calcium: 8.7 mg/dL — ABNORMAL LOW (ref 8.9–10.3)
Chloride: 92 mmol/L — ABNORMAL LOW (ref 98–111)
Creatinine: 0.75 mg/dL (ref 0.61–1.24)
GFR, Est AFR Am: >60 mL/min (ref >60)
GFR, Estimated: >60 mL/min (ref >60)
Glucose, Bld: 123 mg/dL — ABNORMAL HIGH (ref 70–99)
Potassium: 3.5 mmol/L (ref 3.5–5.1)
Sodium: 128 mmol/L — ABNORMAL LOW (ref 135–145)
Total Bilirubin: 0.4 mg/dL (ref 0.3–1.2)
Total Protein: 6.8 g/dL (ref 6.5–8.1)

## 2019-01-04 LAB — MAGNESIUM: Magnesium: 1.5 mg/dL — ABNORMAL LOW (ref 1.7–2.4)

## 2019-01-11 ENCOUNTER — Inpatient Hospital Stay: Payer: BC Managed Care – PPO

## 2019-01-11 ENCOUNTER — Other Ambulatory Visit: Payer: Self-pay | Admitting: Internal Medicine

## 2019-01-11 ENCOUNTER — Other Ambulatory Visit: Payer: Self-pay

## 2019-01-11 ENCOUNTER — Telehealth: Payer: Self-pay | Admitting: *Deleted

## 2019-01-11 DIAGNOSIS — Z5111 Encounter for antineoplastic chemotherapy: Secondary | ICD-10-CM | POA: Diagnosis not present

## 2019-01-11 DIAGNOSIS — C3491 Malignant neoplasm of unspecified part of right bronchus or lung: Secondary | ICD-10-CM

## 2019-01-11 DIAGNOSIS — Z902 Acquired absence of lung [part of]: Secondary | ICD-10-CM | POA: Diagnosis not present

## 2019-01-11 DIAGNOSIS — C3411 Malignant neoplasm of upper lobe, right bronchus or lung: Secondary | ICD-10-CM | POA: Diagnosis not present

## 2019-01-11 DIAGNOSIS — Z79899 Other long term (current) drug therapy: Secondary | ICD-10-CM | POA: Diagnosis not present

## 2019-01-11 DIAGNOSIS — R5383 Other fatigue: Secondary | ICD-10-CM | POA: Diagnosis not present

## 2019-01-11 LAB — CBC WITH DIFFERENTIAL (CANCER CENTER ONLY)
Abs Immature Granulocytes: 0.02 10*3/uL (ref 0.00–0.07)
Basophils Absolute: 0 10*3/uL (ref 0.0–0.1)
Basophils Relative: 0 %
Eosinophils Absolute: 0.1 10*3/uL (ref 0.0–0.5)
Eosinophils Relative: 1 %
HCT: 35.2 % — ABNORMAL LOW (ref 39.0–52.0)
Hemoglobin: 12.4 g/dL — ABNORMAL LOW (ref 13.0–17.0)
Immature Granulocytes: 0 %
Lymphocytes Relative: 38 %
Lymphs Abs: 2.5 10*3/uL (ref 0.7–4.0)
MCH: 36.2 pg — ABNORMAL HIGH (ref 26.0–34.0)
MCHC: 35.2 g/dL (ref 30.0–36.0)
MCV: 102.6 fL — ABNORMAL HIGH (ref 80.0–100.0)
Monocytes Absolute: 0.5 10*3/uL (ref 0.1–1.0)
Monocytes Relative: 8 %
Neutro Abs: 3.4 10*3/uL (ref 1.7–7.7)
Neutrophils Relative %: 53 %
Platelet Count: 231 10*3/uL (ref 150–400)
RBC: 3.43 MIL/uL — ABNORMAL LOW (ref 4.22–5.81)
RDW: 14.8 % (ref 11.5–15.5)
WBC Count: 6.5 10*3/uL (ref 4.0–10.5)
nRBC: 0 % (ref 0.0–0.2)

## 2019-01-11 LAB — MAGNESIUM: Magnesium: 1.3 mg/dL — CL (ref 1.7–2.4)

## 2019-01-11 LAB — CMP (CANCER CENTER ONLY)
ALT: 19 U/L (ref 0–44)
AST: 25 U/L (ref 15–41)
Albumin: 3.9 g/dL (ref 3.5–5.0)
Alkaline Phosphatase: 69 U/L (ref 38–126)
Anion gap: 9 (ref 5–15)
BUN: 5 mg/dL — ABNORMAL LOW (ref 8–23)
CO2: 26 mmol/L (ref 22–32)
Calcium: 8.9 mg/dL (ref 8.9–10.3)
Chloride: 98 mmol/L (ref 98–111)
Creatinine: 0.73 mg/dL (ref 0.61–1.24)
GFR, Est AFR Am: >60 mL/min (ref >60)
GFR, Estimated: >60 mL/min (ref >60)
Glucose, Bld: 130 mg/dL — ABNORMAL HIGH (ref 70–99)
Potassium: 4.3 mmol/L (ref 3.5–5.1)
Sodium: 133 mmol/L — ABNORMAL LOW (ref 135–145)
Total Bilirubin: 0.2 mg/dL — ABNORMAL LOW (ref 0.3–1.2)
Total Protein: 7 g/dL (ref 6.5–8.1)

## 2019-01-11 MED ORDER — MAGNESIUM OXIDE 400 (241.3 MG) MG PO TABS: ORAL_TABLET | ORAL | 1 refills | Status: DC

## 2019-01-11 NOTE — Telephone Encounter (Signed)
TCT patient to advise of low magnesium and that Dr. [name] has sent in a prescription for oral magnesium.  VM message left for pt. on identified phone #

## 2019-01-11 NOTE — Telephone Encounter (Signed)
Received call from [facility] lab, [place]. Pt's magnesium is 1.3 Dr. [name] notified.

## 2019-01-11 NOTE — Telephone Encounter (Signed)
I did send the prescription for magnesium oxide to his pharmacy to be used 3 times a day.

## 2019-01-19 ENCOUNTER — Encounter: Payer: Self-pay | Admitting: Thoracic Surgery (Cardiothoracic Vascular Surgery)

## 2019-01-19 ENCOUNTER — Other Ambulatory Visit: Payer: Self-pay

## 2019-01-19 ENCOUNTER — Ambulatory Visit (INDEPENDENT_AMBULATORY_CARE_PROVIDER_SITE_OTHER): Payer: BC Managed Care – PPO | Admitting: Thoracic Surgery (Cardiothoracic Vascular Surgery)

## 2019-01-19 VITALS — BP 160/96 | HR 71 | Temp 98.1°F | Resp 16 | Ht 68.0 in | Wt 138.1 lb

## 2019-01-19 DIAGNOSIS — Z902 Acquired absence of lung [part of]: Secondary | ICD-10-CM | POA: Diagnosis not present

## 2019-01-19 DIAGNOSIS — C3491 Malignant neoplasm of unspecified part of right bronchus or lung: Secondary | ICD-10-CM

## 2019-01-19 NOTE — Progress Notes (Signed)
PCP is [name], NP Referring Provider is Icard, Octavio Graves, DO  Chief Complaint  Patient presents with  . Routine Post Op    2 month f/u s/p RULobectomy 09/14/18 to check on progress    HPI: Mr. Popoff returns for a scheduled follow-up visit   Sean Rose is a 63 year old man with a history of tobacco abuse and COPD who had a thoracoscopic right upper lobectomy for multiple right upper lobe lung nodules.  He turned out to have a T3, N1, stage IIIa non-small cell carcinoma.  He had a prolonged air leak postoperatively and went home with a chest tube.  We were finally able to get that out on 10/06/2018.  I last saw him in the office on 10/21/2018.  He was doing better at that time but still felt a little short of breath.  In the interim since his last visit he started chemotherapy.  He  has had 3 cycles and is scheduled to have his last cycle tomorrow.  He also underwent an appendectomy for acute appendicitis.  He feels well overall.  He does still have some muscle soreness.  He is not taking anything for that.  He says that sometimes he feels like he cannot quite get a deep breath but he otherwise has not had any problems with his breathing.  Past Medical History:  Diagnosis Date  . Anginal pain ([place])   . CAD (coronary artery disease)   . CAP (community acquired pneumonia) 09/2016  . COPD (chronic obstructive pulmonary disease) ([place])   . GERD (gastroesophageal reflux disease)   . Heart murmur    "I was told I had one when I was a kid"  . Hemorrhoids   . History of anal fissures    "no surgeries" (10/30/2016)  . Hyperlipidemia   . Hypertension   . Peripheral arterial disease ([place])    status post right common iliac artery stenting back in 2007  . Seasonal allergies   . Tobacco abuse     Past Surgical History:  Procedure Laterality Date  . ANKLE SURGERY Left    "rebuilt it"  . ANTERIOR CRUCIATE LIGAMENT REPAIR Right   . CARDIAC CATHETERIZATION  09/23/2008   Continued medical  therapy - may need GI evaluation in addition.  Marland Kitchen CARDIAC CATHETERIZATION  10/28/2007   Medical therapy recommended.  Marland Kitchen CARDIAC CATHETERIZATION  11/18/2006   In-stent restenosis RCA  (50% distal edge, 80% segmental mid, and 50-60% segmental proximal). Successful cutting balloon atherectomy using a 325X15 cutting balloon. 3 inflations with atherectomy performed on mid and proximal portions resulting in reduction of 80% mid in-stent restenosis to less than 20% residual and 50-60% segmental proximal to less than 20% residual without dissection.  Marland Kitchen CARDIAC CATHETERIZATION  02/26/2006   Severe stenosis in RCA. Stenting performed using IVUS. 3.5x20 Maverick balloon deployed at [employer]. Distal stent-a 4x28 Liberte stent-deployed 12atm 48sec, 12atm 31sec, 4atm 19sec. Mid stent-a 4x28 Liberte stent-deployed 14atm 45sec, 14atm 60sec, 14atm 44sec. Proximal stent-4x8 Liberte- 14atm 45sec,14atm 47sec, 16atm 43sec. Severely diseased segment then appeared TIMI-3 flow.  Marland Kitchen CARDIOVASCULAR STRESS TEST  11/17/2012   No significant ECG changes. Septal perfusion defect is new when complared to study from 2010. Abnormal myocardial perfusion imaging with a basal to mid perfusion suggestive of previous MI.  Marland Kitchen CAROTID DOPPLER  08/09/2011   Bilateral Bulb/Proximal ICA - demonstrated a mild amount of fibrous plaque without evidence of significant diameter reduction reduction or other vascular abnormality.  . CHEST TUBE INSERTION Right 09/02/2018  Procedure: Chest Tube Insertion;  Surgeon: Garner Nash, DO;  Location: [facility];  Service: Thoracic;  Laterality: Right;  . COLONOSCOPY     2003, 2014  . CORONARY ANGIOPLASTY WITH STENT PLACEMENT    . FEMORAL ARTERY STENT    . INGUINAL HERNIA REPAIR Right   . KNEE ARTHROSCOPY Right "multiple"  . LAPAROSCOPIC APPENDECTOMY N/A 12/07/2018   Procedure: APPENDECTOMY LAPAROSCOPIC;  Surgeon: Coralie Keens, MD;  Location: [facility];  Service: General;  Laterality: N/A;  . LOWER EXTREMITY  ARTERIAL DOPPLER  01/31/2011   Bilateral ABIs-normal values with no suggestion of arterial insuff to the lower extremities at rest. Right CIA stent-mild amount of nonhemodynamically significant plaque is noted throughout  . 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 PERCUTANEOUS STENT INTERVENTION  04/04/2006 & 04/13/2015   a. Right common iliac artery with an 8.0x18 mm Herculink stent deployed at 12 atm. Stenosis was reduced from 80% to 0% with brisk flow. b. I-cast stenting to left common iliac artery  . PERIPHERAL VASCULAR CATHETERIZATION N/A 04/13/2015   Procedure: Lower Extremity Angiography;  Surgeon: Lorretta Harp, MD; L-oCIA 75%, 40-50% L-EIA, R-CIA stent patent, s/p 8 mm x 38 mm ICast covered stent>>0% stenosis in L-oCIA     . SHOULDER ARTHROSCOPY WITH ROTATOR CUFF REPAIR Right   . TRANSTHORACIC ECHOCARDIOGRAM  11/26/2012   EF not noted. Aortic valve-sclerosis without stenosis, no regurgiation.   Marland Kitchen UPPER GASTROINTESTINAL ENDOSCOPY    . US CAROTID DOPPLER BILATERAL ([place] HX)  08/09/2011   Bilateral Bulb/Proximal ICAa demonstrated a mild amount of fibrous plaque without evidence of significant diameter reduction or any other vascular abnormality.  Marland Kitchen VIDEO ASSISTED THORACOSCOPY (VATS)/ LOBECTOMY Right 09/14/2018   Procedure: RIGHT VIDEO ASSISTED THORACOSCOPY (VATS)/ RIGHT UPPER LOBECTOMY;  Surgeon: Melrose Nakayama, MD;  Location: [facility];  Service: Thoracic;  Laterality: Right;  Marland Kitchen VIDEO BRONCHOSCOPY WITH ENDOBRONCHIAL NAVIGATION N/A 09/02/2018   Procedure: VIDEO BRONCHOSCOPY WITH ENDOBRONCHIAL NAVIGATION;  Surgeon: Garner Nash, DO;  [facility];  Service: Thoracic;  Laterality: N/A;    Family History  Problem Relation Age of Onset  . Colon cancer Mother   . Heart disease Father   . Heart disease Paternal Grandfather     Social History Social History   Tobacco Use  . Smoking status: Current Every Day Smoker    Packs/day: 0.25    Years: 38.00    Pack years: 9.50     Types: Cigarettes  . Smokeless tobacco: Never Used  . Tobacco comment: pack per day 12.17.19  Substance Use Topics  . Alcohol use: Yes    Alcohol/week: 36.0 standard drinks    Types: 36 Cans of beer per week    Comment: 10/30/2016 "I usually drink 4-8 beers qd"  . Drug use: No    Current Outpatient Medications  Medication Sig Dispense Refill  . amLODipine (NORVASC) 5 MG tablet Take 1 tablet (5 mg total) by mouth daily. 30 tablet 1  . aspirin EC 81 MG tablet Take 81 mg by mouth daily.    . clopidogrel (PLAVIX) 75 MG tablet TAKE 1 TABLET BY MOUTH EVERY DAY (Patient taking differently: Take 75 mg by mouth daily. ) 90 tablet 1  . dexamethasone (DECADRON) 4 MG tablet One tab po bid the day before, day of and day after chemo every 3 weeks. 40 tablet 0  . diphenhydrAMINE (BENADRYL) 25 mg capsule Take 25 mg by mouth daily as needed for allergies.     Marland Kitchen  folic acid (FOLVITE) 1 MG tablet Take 1 tablet (1 mg total) by mouth daily. 30 tablet 4  . ibuprofen (ADVIL,MOTRIN) 200 MG tablet Take 400 mg by mouth every 6 (six) hours as needed for headache or moderate pain.     . Magnesium 250 MG TABS Take 250 mg by mouth daily.     . magnesium oxide (MAG-OX) 400 (241.3 Mg) MG tablet 1 tablet p.o. 3 times a day 90 tablet 1  . metoprolol succinate (TOPROL-XL) 100 MG 24 hr tablet TAKE 1 TABLET BY MOUTH EVERY DAY (Patient taking differently: Take 100 mg by mouth daily. ) 90 tablet 1  . Multiple Vitamin (MULTIVITAMIN WITH MINERALS) TABS tablet Take 1 tablet by mouth daily.    . nicotine (NICODERM CQ - DOSED IN MG/24 HOURS) 14 mg/24hr patch Place 1 patch (14 mg total) onto the skin daily. 28 patch 0  . oxyCODONE (OXY IR/ROXICODONE) 5 MG immediate release tablet Take 1 tablet (5 mg total) by mouth every 6 (six) hours as needed for moderate pain. 20 tablet 0  . prochlorperazine (COMPAZINE) 10 MG tablet Take 1 tablet (10 mg total) by mouth every 6 (six) hours as needed for nausea or vomiting. (Patient not taking: Reported  on 12/07/2018) 30 tablet 0  . umeclidinium-vilanterol (ANORO ELLIPTA) 62.5-25 MCG/INH AEPB Inhale 1 puff into the lungs daily. 60 each 3   No current facility-administered medications for this visit.     Allergies  Allergen Reactions  . Tyloxapol Nausea Only    Physical Exam BP (!) 160/96 (BP Location: Right Arm, Patient Position: Sitting, Cuff Size: Normal)   Pulse 71   Temp 98.1 F (36.7 C) (Skin)   Resp 16   Ht _0  (1.727 m)   Wt 138 lb 1.6 oz (62.6 kg)   SpO2 98% Comment: RA  BMI 21.00 kg/m  Recheck blood pressure 85/83 63 year old man well-developed and well-nourished in no acute distress Wearing surgical mask Lungs diminished at right base, otherwise clear Incisions well-healed  Impression: Sean Rose is a 63 year old man with a history of tobacco abuse and COPD who underwent a thoracoscopic right upper lobectomy for what turned out to be stage IIIa (T3, N1) non-small cell carcinoma.  Postoperatively had a prolonged air leak and went home with a chest tube.  We were finally able to get that out in the office about a month postoperatively.  Since then he has done extremely well.  He currently is undergoing chemotherapy.  I will defer to Dr. [name] for follow-up with CT imaging.  I will plan to see Mr. Basnett back in February for a one-year follow-up.  We will do a PA and lateral chest x-ray at that time.  Hypertension-blood pressure elevated today.  He does have a cuff at home.  I recommended that he check himself on a fairly regular basis.  If he continues to have systolics above 016 he will need medication adjustment.   Melrose Nakayama, MD [facility] [phone]

## 2019-01-20 ENCOUNTER — Other Ambulatory Visit: Payer: Self-pay

## 2019-01-20 ENCOUNTER — Inpatient Hospital Stay: Payer: BC Managed Care – PPO

## 2019-01-20 ENCOUNTER — Encounter: Payer: Self-pay | Admitting: Physician Assistant

## 2019-01-20 ENCOUNTER — Inpatient Hospital Stay (HOSPITAL_BASED_OUTPATIENT_CLINIC_OR_DEPARTMENT_OTHER): Payer: BC Managed Care – PPO | Admitting: Physician Assistant

## 2019-01-20 VITALS — BP 150/79 | HR 69 | Temp 98.7°F | Resp 17 | Ht 68.0 in | Wt 131.6 lb

## 2019-01-20 DIAGNOSIS — C3411 Malignant neoplasm of upper lobe, right bronchus or lung: Secondary | ICD-10-CM

## 2019-01-20 DIAGNOSIS — I10 Essential (primary) hypertension: Secondary | ICD-10-CM | POA: Diagnosis not present

## 2019-01-20 DIAGNOSIS — C3491 Malignant neoplasm of unspecified part of right bronchus or lung: Secondary | ICD-10-CM

## 2019-01-20 DIAGNOSIS — C772 Secondary and unspecified malignant neoplasm of intra-abdominal lymph nodes: Secondary | ICD-10-CM

## 2019-01-20 DIAGNOSIS — Z5111 Encounter for antineoplastic chemotherapy: Secondary | ICD-10-CM

## 2019-01-20 DIAGNOSIS — Z902 Acquired absence of lung [part of]: Secondary | ICD-10-CM | POA: Diagnosis not present

## 2019-01-20 DIAGNOSIS — R5383 Other fatigue: Secondary | ICD-10-CM | POA: Diagnosis not present

## 2019-01-20 DIAGNOSIS — Z79899 Other long term (current) drug therapy: Secondary | ICD-10-CM | POA: Diagnosis not present

## 2019-01-20 LAB — CMP (CANCER CENTER ONLY)
ALT: 12 U/L (ref 0–44)
AST: 19 U/L (ref 15–41)
Albumin: 4.1 g/dL (ref 3.5–5.0)
Alkaline Phosphatase: 64 U/L (ref 38–126)
Anion gap: 13 (ref 5–15)
BUN: 5 mg/dL — ABNORMAL LOW (ref 8–23)
CO2: 23 mmol/L (ref 22–32)
Calcium: 9.3 mg/dL (ref 8.9–10.3)
Chloride: 96 mmol/L — ABNORMAL LOW (ref 98–111)
Creatinine: 0.79 mg/dL (ref 0.61–1.24)
GFR, Est AFR Am: >60 mL/min (ref >60)
GFR, Estimated: >60 mL/min (ref >60)
Glucose, Bld: 115 mg/dL — ABNORMAL HIGH (ref 70–99)
Potassium: 3.9 mmol/L (ref 3.5–5.1)
Sodium: 132 mmol/L — ABNORMAL LOW (ref 135–145)
Total Bilirubin: 0.5 mg/dL (ref 0.3–1.2)
Total Protein: 7.3 g/dL (ref 6.5–8.1)

## 2019-01-20 LAB — CBC WITH DIFFERENTIAL (CANCER CENTER ONLY)
Abs Immature Granulocytes: 0.02 10*3/uL (ref 0.00–0.07)
Basophils Absolute: 0 10*3/uL (ref 0.0–0.1)
Basophils Relative: 0 %
Eosinophils Absolute: 0 10*3/uL (ref 0.0–0.5)
Eosinophils Relative: 0 %
HCT: 35.8 % — ABNORMAL LOW (ref 39.0–52.0)
Hemoglobin: 12.5 g/dL — ABNORMAL LOW (ref 13.0–17.0)
Immature Granulocytes: 0 %
Lymphocytes Relative: 19 %
Lymphs Abs: 0.9 10*3/uL (ref 0.7–4.0)
MCH: 35.7 pg — ABNORMAL HIGH (ref 26.0–34.0)
MCHC: 34.9 g/dL (ref 30.0–36.0)
MCV: 102.3 fL — ABNORMAL HIGH (ref 80.0–100.0)
Monocytes Absolute: 0.7 10*3/uL (ref 0.1–1.0)
Monocytes Relative: 14 %
Neutro Abs: 3.4 10*3/uL (ref 1.7–7.7)
Neutrophils Relative %: 67 %
Platelet Count: 403 10*3/uL — ABNORMAL HIGH (ref 150–400)
RBC: 3.5 MIL/uL — ABNORMAL LOW (ref 4.22–5.81)
RDW: 14.4 % (ref 11.5–15.5)
WBC Count: 5 10*3/uL (ref 4.0–10.5)
nRBC: 0 % (ref 0.0–0.2)

## 2019-01-20 LAB — MAGNESIUM: Magnesium: 1.7 mg/dL (ref 1.7–2.4)

## 2019-01-20 MED ORDER — SODIUM CHLORIDE 0.9 % IV SOLN
75.0000 mg/m2 | Freq: Once | INTRAVENOUS | Status: AC
  Administered 2019-01-20: 122 mg via INTRAVENOUS
  Filled 2019-01-20: qty 122

## 2019-01-20 MED ORDER — POTASSIUM CHLORIDE 2 MEQ/ML IV SOLN
Freq: Once | INTRAVENOUS | Status: AC
  Administered 2019-01-20: 11:00:00 via INTRAVENOUS
  Filled 2019-01-20: qty 10

## 2019-01-20 MED ORDER — PALONOSETRON HCL INJECTION 0.25 MG/5ML
0.2500 mg | Freq: Once | INTRAVENOUS | Status: AC
  Administered 2019-01-20: 0.25 mg via INTRAVENOUS

## 2019-01-20 MED ORDER — SODIUM CHLORIDE 0.9 % IV SOLN
Freq: Once | INTRAVENOUS | Status: AC
  Administered 2019-01-20: 14:00:00 via INTRAVENOUS
  Filled 2019-01-20: qty 5

## 2019-01-20 MED ORDER — SODIUM CHLORIDE 0.9 % IV SOLN
Freq: Once | INTRAVENOUS | Status: AC
  Administered 2019-01-20: 11:00:00 via INTRAVENOUS
  Filled 2019-01-20: qty 250

## 2019-01-20 MED ORDER — SODIUM CHLORIDE 0.9 % IV SOLN
500.0000 mg/m2 | Freq: Once | INTRAVENOUS | Status: AC
  Administered 2019-01-20: 800 mg via INTRAVENOUS
  Filled 2019-01-20: qty 20

## 2019-01-20 MED ORDER — PALONOSETRON HCL INJECTION 0.25 MG/5ML
INTRAVENOUS | Status: AC
  Filled 2019-01-20: qty 5

## 2019-01-20 NOTE — Progress Notes (Signed)
[facility] OFFICE PROGRESS NOTE  Sean Peng, NP [place] [address]  DIAGNOSIS: Stage IIIa (T3, N1, M0) non-small cell lung cancer, adenocarcinoma with no actionable mutations presented with multiple pulmonary nodules in the right upper lobe as well as metastatic disease in intraparenchymal lymphadenopathy. This was diagnosed in February 2020.  PRIOR THERAPY: Status post right upper lobectomy with lymph node dissection.  CURRENT THERAPY: Adjuvant systemic chemotherapy with cisplatin 75 mg/M2 and Alimta 500 mg/M2 every 3 weeks.  Status post 3 cycles.  INTERVAL HISTORY: Sean Rose 63 y.o. male returns to the clinic for a follow-up visit.  The patient is feeling well today without any concerning complaints.  He is been tolerating his treatment well without any adverse effects except for some mild fatigue and oral sensitivity following treatment for which he manages with a solution of baking soda and salt water.  He denies any chest pain, shortness of breath, cough, or hemoptysis.  He denies any nausea, vomiting, diarrhea, or constipation.  He denies any headache or vision changes.  He is here today for evaluation before starting his final cycle of adjuvant systemic chemotherapy.  MEDICAL HISTORY: Past Medical History:  Diagnosis Date  . Anginal pain ([place])   . CAD (coronary artery disease)   . CAP (community acquired pneumonia) 09/2016  . COPD (chronic obstructive pulmonary disease) ([place])   . GERD (gastroesophageal reflux disease)   . Heart murmur    "I was told I had one when I was a kid"  . Hemorrhoids   . History of anal fissures    "no surgeries" (10/30/2016)  . Hyperlipidemia   . Hypertension   . Peripheral arterial disease ([place])    status post right common iliac artery stenting back in 2007  . Seasonal allergies   . Tobacco abuse     ALLERGIES:  is allergic to tyloxapol.  MEDICATIONS:  Current Outpatient Medications  Medication  Sig Dispense Refill  . amLODipine (NORVASC) 5 MG tablet Take 1 tablet (5 mg total) by mouth daily. 30 tablet 1  . aspirin EC 81 MG tablet Take 81 mg by mouth daily.    . clopidogrel (PLAVIX) 75 MG tablet TAKE 1 TABLET BY MOUTH EVERY DAY (Patient taking differently: Take 75 mg by mouth daily. ) 90 tablet 1  . dexamethasone (DECADRON) 4 MG tablet One tab po bid the day before, day of and day after chemo every 3 weeks. 40 tablet 0  . diphenhydrAMINE (BENADRYL) 25 mg capsule Take 25 mg by mouth daily as needed for allergies.     . folic acid (FOLVITE) 1 MG tablet Take 1 tablet (1 mg total) by mouth daily. 30 tablet 4  . magnesium oxide (MAG-OX) 400 (241.3 Mg) MG tablet 1 tablet p.o. 3 times a day 90 tablet 1  . metoprolol succinate (TOPROL-XL) 100 MG 24 hr tablet TAKE 1 TABLET BY MOUTH EVERY DAY (Patient taking differently: Take 100 mg by mouth daily. ) 90 tablet 1  . Multiple Vitamin (MULTIVITAMIN WITH MINERALS) TABS tablet Take 1 tablet by mouth daily.    Marland Kitchen umeclidinium-vilanterol (ANORO ELLIPTA) 62.5-25 MCG/INH AEPB Inhale 1 puff into the lungs daily. 60 each 3  . ibuprofen (ADVIL,MOTRIN) 200 MG tablet Take 400 mg by mouth every 6 (six) hours as needed for headache or moderate pain.     Marland Kitchen oxyCODONE (OXY IR/ROXICODONE) 5 MG immediate release tablet Take 1 tablet (5 mg total) by mouth every 6 (six) hours as needed for  moderate pain. (Patient not taking: Reported on 01/19/2019) 20 tablet 0  . prochlorperazine (COMPAZINE) 10 MG tablet Take 1 tablet (10 mg total) by mouth every 6 (six) hours as needed for nausea or vomiting. (Patient not taking: Reported on 12/07/2018) 30 tablet 0   No current facility-administered medications for this visit.     SURGICAL HISTORY:  Past Surgical History:  Procedure Laterality Date  . ANKLE SURGERY Left    "rebuilt it"  . ANTERIOR CRUCIATE LIGAMENT REPAIR Right   . CARDIAC CATHETERIZATION  09/23/2008   Continued medical therapy - may need GI evaluation in addition.   Marland Kitchen CARDIAC CATHETERIZATION  10/28/2007   Medical therapy recommended.  Marland Kitchen CARDIAC CATHETERIZATION  11/18/2006   In-stent restenosis RCA  (50% distal edge, 80% segmental mid, and 50-60% segmental proximal). Successful cutting balloon atherectomy using a [id number] cutting balloon. 3 inflations with atherectomy performed on mid and proximal portions resulting in reduction of 80% mid in-stent restenosis to less than 20% residual and 50-60% segmental proximal to less than 20% residual without dissection.  Marland Kitchen CARDIAC CATHETERIZATION  02/26/2006   Severe stenosis in RCA. Stenting performed using IVUS. 3.5x20 Maverick balloon deployed at [employer]. Distal stent-a 4x28 Liberte stent-deployed 12atm 48sec, 12atm 31sec, 4atm 19sec. Mid stent-a 4x28 Liberte stent-deployed 14atm 45sec, 14atm 60sec, 14atm 44sec. Proximal stent-4x8 Liberte- 14atm 45sec,14atm 47sec, 16atm 43sec. Severely diseased segment then appeared TIMI-3 flow.  Marland Kitchen CARDIOVASCULAR STRESS TEST  11/17/2012   No significant ECG changes. Septal perfusion defect is new when complared to study from 2010. Abnormal myocardial perfusion imaging with a basal to mid perfusion suggestive of previous MI.  Marland Kitchen CAROTID DOPPLER  08/09/2011   Bilateral Bulb/Proximal ICA - demonstrated a mild amount of fibrous plaque without evidence of significant diameter reduction reduction or other vascular abnormality.  . CHEST TUBE INSERTION Right 09/02/2018   Procedure: Chest Tube Insertion;  Surgeon: Garner Nash, DO;  Location: [facility];  Service: Thoracic;  Laterality: Right;  . COLONOSCOPY     2003, 2014  . CORONARY ANGIOPLASTY WITH STENT PLACEMENT    . FEMORAL ARTERY STENT    . INGUINAL HERNIA REPAIR Right   . KNEE ARTHROSCOPY Right "multiple"  . LAPAROSCOPIC APPENDECTOMY N/A 12/07/2018   Procedure: APPENDECTOMY LAPAROSCOPIC;  Surgeon: Coralie Keens, MD;  [facility];  Service: General;  Laterality: N/A;  . LOWER EXTREMITY ARTERIAL DOPPLER  01/31/2011   Bilateral  ABIs-normal values with no suggestion of arterial insuff to the lower extremities at rest. Right CIA stent-mild amount of nonhemodynamically significant plaque is noted throughout  . 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 PERCUTANEOUS STENT INTERVENTION  04/04/2006 & 04/13/2015   a. Right common iliac artery with an 8.0x18 mm Herculink stent deployed at 12 atm. Stenosis was reduced from 80% to 0% with brisk flow. b. I-cast stenting to left common iliac artery  . PERIPHERAL VASCULAR CATHETERIZATION N/A 04/13/2015   Procedure: Lower Extremity Angiography;  Surgeon: Lorretta Harp, MD; L-oCIA 75%, 40-50% L-EIA, R-CIA stent patent, s/p 8 mm x 38 mm ICast covered stent>>0% stenosis in L-oCIA     . SHOULDER ARTHROSCOPY WITH ROTATOR CUFF REPAIR Right   . TRANSTHORACIC ECHOCARDIOGRAM  11/26/2012   EF not noted. Aortic valve-sclerosis without stenosis, no regurgiation.   Marland Kitchen UPPER GASTROINTESTINAL ENDOSCOPY    . US CAROTID DOPPLER BILATERAL ([place] HX)  08/09/2011   Bilateral Bulb/Proximal ICAa demonstrated a mild amount of fibrous plaque without evidence of significant diameter reduction or any other  vascular abnormality.  Marland Kitchen VIDEO ASSISTED THORACOSCOPY (VATS)/ LOBECTOMY Right 09/14/2018   Procedure: RIGHT VIDEO ASSISTED THORACOSCOPY (VATS)/ RIGHT UPPER LOBECTOMY;  Surgeon: Melrose Nakayama, MD;  Location: [facility];  Service: Thoracic;  Laterality: Right;  Marland Kitchen VIDEO BRONCHOSCOPY WITH ENDOBRONCHIAL NAVIGATION N/A 09/02/2018   Procedure: VIDEO BRONCHOSCOPY WITH ENDOBRONCHIAL NAVIGATION;  Surgeon: Garner Nash, DO;  [facility];  Service: Thoracic;  Laterality: N/A;    REVIEW OF SYSTEMS:   Review of Systems  Constitutional: Positive for fatigue. Negative for appetite change, chills, fever and unexpected weight change.  HENT: Positive for mouth sensitivity without ulceration/sores. Negative for nosebleeds, sore throat and trouble swallowing.   Eyes: Negative for eye problems and icterus.   Respiratory: Negative for cough, hemoptysis, shortness of breath and wheezing.   Cardiovascular: Negative for chest pain and leg swelling.  Gastrointestinal: Negative for abdominal pain, constipation, diarrhea, nausea and vomiting.  Genitourinary: Negative for bladder incontinence, difficulty urinating, dysuria, frequency and hematuria.   Musculoskeletal: Negative for back pain, gait problem, neck pain and neck stiffness.  Skin: Negative for itching and rash.  Neurological: Negative for dizziness, extremity weakness, gait problem, headaches, light-headedness and seizures.  Hematological: Negative for adenopathy. Does not bruise/bleed easily.  Psychiatric/Behavioral: Negative for confusion, depression and sleep disturbance. The patient is not nervous/anxious.     PHYSICAL EXAMINATION:  Blood pressure (!) 150/79, pulse 69, temperature 98.7 F (37.1 C), temperature source Oral, resp. rate 17, height '5\' 8"'  (1.727 m), weight 131 lb 9.6 oz (59.7 kg), SpO2 100 %.  ECOG PERFORMANCE STATUS: 1 - Symptomatic but completely ambulatory  Physical Exam  Constitutional: Oriented to person, place, and time and well-developed, well-nourished, and in no distress.  HENT:  Head: Normocephalic and atraumatic.  Mouth/Throat: Oropharynx is clear and moist. No oropharyngeal exudate.  Eyes: Conjunctivae are normal. Right eye exhibits no discharge. Left eye exhibits no discharge. No scleral icterus.  Neck: Normal range of motion. Neck supple.  Cardiovascular: Normal rate, regular rhythm, normal heart sounds and intact distal pulses.   Pulmonary/Chest: Effort normal and breath sounds normal. No respiratory distress. No wheezes. No rales.  Abdominal: Soft. Bowel sounds are normal. Exhibits no distension and no mass. There is no tenderness.  Musculoskeletal: Normal range of motion. Exhibits no edema.  Lymphadenopathy:    No cervical adenopathy.  Neurological: Alert and oriented to person, place, and time. Exhibits  normal muscle tone. Gait normal. Coordination normal.  Skin: Skin is warm and dry. No rash noted. Not diaphoretic. No erythema. No pallor.  Psychiatric: Mood, memory and judgment normal.  Vitals reviewed.  LABORATORY DATA: Lab Results  Component Value Date   WBC 5.0 01/20/2019   HGB 12.5 (L) 01/20/2019   HCT 35.8 (L) 01/20/2019   MCV 102.3 (H) 01/20/2019   PLT 403 (H) 01/20/2019      Chemistry      Component Value Date/Time   NA 132 (L) 01/20/2019 0919   NA 130 (L) 03/05/2017 1110   K 3.9 01/20/2019 0919   CL 96 (L) 01/20/2019 0919   CO2 23 01/20/2019 0919   BUN 5 (L) 01/20/2019 0919   BUN 4 (L) 03/05/2017 1110   CREATININE 0.79 01/20/2019 0919   CREATININE 0.58 (L) 05/22/2015 1041      Component Value Date/Time   CALCIUM 9.3 01/20/2019 0919   ALKPHOS 64 01/20/2019 0919   AST 19 01/20/2019 0919   ALT 12 01/20/2019 0919   BILITOT 0.5 01/20/2019 0919       RADIOGRAPHIC STUDIES:  No results found.   ASSESSMENT/PLAN:  This is a very pleasant 63 year old Caucasian male with stage IIIb non-small cell lung cancer, adenocarcinoma.  He is status post a right upper lobectomy and lymph node dissection under the care of [name].  He has no actionable mutations.   He is currently undergoing adjuvant treatment with cisplatin 75 mg/m2 and Alimta 500 mg/m2.  He is status post 3 cycles.  He is tolerating treatment well except for fatigue and oral sensitivity.  Treatment was delayed after cycle #2 due to the patient developing appendicitis.  The patient is feeling well today and he was seen with Dr. [name].  Labs were reviewed with the patient.  We recommend he proceed with cycle #4 today scheduled.  I will arrange for the patient to receive a restaging CT scan of the chest prior to his next appointment in 3 weeks.  We will see the patient back for follow-up visit in 3 weeks for evaluation and to review his scan results.  The patient was advised to call immediately if he  has any concerning symptoms in the interval. The patient voices understanding of current disease status and treatment options and is in agreement with the current care plan. All questions were answered. The patient knows to call the clinic with any problems, questions or concerns. We can certainly see the patient much sooner if necessary   Orders Placed This Encounter  Procedures  . CT Chest W Contrast    Standing Status:   Future    Standing Expiration Date:   01/20/2020    Order Specific Question:   ** REASON FOR EXAM (FREE TEXT)    Answer:   Restaging Lung Cancer    Order Specific Question:   If indicated for the ordered procedure, I authorize the administration of contrast media per Radiology protocol    Answer:   Yes    Order Specific Question:   Preferred imaging location?    Answer:   [facility]    Order Specific Question:   Radiology Contrast Protocol - do NOT remove file path    Answer:   [email or web address]  . CMP ([facility] only)    Standing Status:   Standing    Number of Occurrences:   3    Standing Expiration Date:   01/20/2020  . CBC with Differential (Cancer Center Only)    Standing Status:   Standing    Number of Occurrences:   3    Standing Expiration Date:   01/20/2020  . Magnesium    Standing Status:   Standing    Number of Occurrences:   3    Standing Expiration Date:   01/20/2020     Tobe Sos Jayleen Scaglione, PA-C 01/20/19  ADDENDUM: Hematology/Oncology Attending: I had a face-to-face encounter with the patient today.  I recommended his care plan.  This is a very pleasant 63 years old white male with a stage IIIA non-small cell lung cancer, adenocarcinoma status post right upper lobectomy with lymph node dissection.  The patient is currently undergoing adjuvant systemic chemotherapy with cisplatin and Alimta status post 3 cycles.  He has been tolerating his treatment well with no concerning adverse effects. I recommended  for the patient to proceed with cycle #4 today as planned. We will see him back for follow-up visit in 1 months for evaluation with repeat CT scan of the chest for restaging of his disease. He was advised to call immediately if he has any concerning symptoms in  the interval.  Disclaimer: This note was dictated with voice recognition software. Similar sounding words can inadvertently be transcribed and may be missed upon review. Eilleen Kempf, MD 01/20/19'

## 2019-01-20 NOTE — Patient Instructions (Signed)
[facility] Discharge Instructions for Patients Receiving Chemotherapy  Today you received the following chemotherapy agents Pemetrexed (ALIMTA) & Cisplatin (PLATINOL).  To help prevent nausea and vomiting after your treatment, we encourage you to take your nausea medication as prescribed.   If you develop nausea and vomiting that is not controlled by your nausea medication, call the clinic.   BELOW ARE SYMPTOMS THAT SHOULD BE REPORTED IMMEDIATELY:  *FEVER GREATER THAN 100.5 F  *CHILLS WITH OR WITHOUT FEVER  NAUSEA AND VOMITING THAT IS NOT CONTROLLED WITH YOUR NAUSEA MEDICATION  *UNUSUAL SHORTNESS OF BREATH  *UNUSUAL BRUISING OR BLEEDING  TENDERNESS IN MOUTH AND THROAT WITH OR WITHOUT PRESENCE OF ULCERS  *URINARY PROBLEMS  *BOWEL PROBLEMS  UNUSUAL RASH Items with * indicate a potential emergency and should be followed up as soon as possible.  Feel free to call the clinic should you have any questions or concerns. The clinic phone number is [phone].  Please show the [facility] at check-in to the Emergency Department and triage nurse.  Coronavirus (COVID-19) Are you at risk?  Are you at risk for the Coronavirus (COVID-19)?  To be considered HIGH RISK for Coronavirus (COVID-19), you have to meet the following criteria:  . Traveled to [place], [place], [place], [place] or [place]; or in the [place] to [place], [place], [place], or [place]; and have fever, cough, and shortness of breath within the last 2 weeks of travel OR . Been in close contact with a person diagnosed with COVID-19 within the last 2 weeks and have fever, cough, and shortness of breath . IF YOU DO NOT MEET THESE CRITERIA, YOU ARE CONSIDERED LOW RISK FOR COVID-19.  What to do if you are HIGH RISK for COVID-19?  Marland Kitchen If you are having a medical emergency, call 911. . Seek medical care right away. Before you go to a doctor's office, urgent care or emergency department, call  ahead and tell them about your recent travel, contact with someone diagnosed with COVID-19, and your symptoms. You should receive instructions from your physician's office regarding next steps of care.  . When you arrive at healthcare provider, tell the healthcare staff immediately you have returned from visiting [place], [place], [place], [place] or [place]; or traveled in the [place] to [place], [place], [place], or [place]; in the last two weeks or you have been in close contact with a person diagnosed with COVID-19 in the last 2 weeks.   . Tell the health care staff about your symptoms: fever, cough and shortness of breath. . After you have been seen by a medical provider, you will be either: o Tested for (COVID-19) and discharged home on quarantine except to seek medical care if symptoms worsen, and asked to  - Stay home and avoid contact with others until you get your results (4-5 days)  - Avoid travel on public transportation if possible (such as bus, train, or airplane) or o Sent to the Emergency Department by EMS for evaluation, COVID-19 testing, and possible admission depending on your condition and test results.  What to do if you are LOW RISK for COVID-19?  Reduce your risk of any infection by using the same precautions used for avoiding the common cold or flu:  Marland Kitchen Wash your hands often with soap and warm water for at least 20 seconds.  If soap and water are not readily available, use an alcohol-based hand sanitizer with at least 60% alcohol.  Marland Kitchen  If coughing or sneezing, cover your mouth and nose by coughing or sneezing into the elbow areas of your shirt or coat, into a tissue or into your sleeve (not your hands). . Avoid shaking hands with others and consider head nods or verbal greetings only. . Avoid touching your eyes, nose, or mouth with unwashed hands.  . Avoid close contact with people who are sick. . Avoid places or events with large numbers of people in one location,  like concerts or sporting events. . Carefully consider travel plans you have or are making. . If you are planning any travel outside or inside the [place], visit the CDC's Travelers' Health webpage for the latest health notices. . If you have some symptoms but not all symptoms, continue to monitor at home and seek medical attention if your symptoms worsen. . If you are having a medical emergency, call 911.   [place] / e-Visit: [email or web address]         [facility]: [facility]: [phone]                   [facility]: [phone]

## 2019-01-21 ENCOUNTER — Telehealth: Payer: Self-pay | Admitting: Physician Assistant

## 2019-01-21 NOTE — Telephone Encounter (Signed)
Called - unable to reach pt . Left message and scheduled appt per 6/24 los -

## 2019-01-26 ENCOUNTER — Other Ambulatory Visit: Payer: BLUE CROSS/BLUE SHIELD

## 2019-01-27 ENCOUNTER — Other Ambulatory Visit: Payer: Self-pay

## 2019-01-27 ENCOUNTER — Inpatient Hospital Stay: Payer: BC Managed Care – PPO | Attending: Internal Medicine

## 2019-01-27 DIAGNOSIS — Z902 Acquired absence of lung [part of]: Secondary | ICD-10-CM | POA: Diagnosis not present

## 2019-01-27 DIAGNOSIS — Z79899 Other long term (current) drug therapy: Secondary | ICD-10-CM | POA: Diagnosis not present

## 2019-01-27 DIAGNOSIS — C3411 Malignant neoplasm of upper lobe, right bronchus or lung: Secondary | ICD-10-CM | POA: Insufficient documentation

## 2019-01-27 DIAGNOSIS — C3491 Malignant neoplasm of unspecified part of right bronchus or lung: Secondary | ICD-10-CM

## 2019-01-27 LAB — CMP (CANCER CENTER ONLY)
ALT: 36 U/L (ref 0–44)
AST: 38 U/L (ref 15–41)
Albumin: 4 g/dL (ref 3.5–5.0)
Alkaline Phosphatase: 65 U/L (ref 38–126)
Anion gap: 11 (ref 5–15)
BUN: 7 mg/dL — ABNORMAL LOW (ref 8–23)
CO2: 27 mmol/L (ref 22–32)
Calcium: 9.3 mg/dL (ref 8.9–10.3)
Chloride: 93 mmol/L — ABNORMAL LOW (ref 98–111)
Creatinine: 0.82 mg/dL (ref 0.61–1.24)
GFR, Est AFR Am: >60 mL/min (ref >60)
GFR, Estimated: >60 mL/min (ref >60)
Glucose, Bld: 93 mg/dL (ref 70–99)
Potassium: 4.6 mmol/L (ref 3.5–5.1)
Sodium: 131 mmol/L — ABNORMAL LOW (ref 135–145)
Total Bilirubin: 0.4 mg/dL (ref 0.3–1.2)
Total Protein: 7.2 g/dL (ref 6.5–8.1)

## 2019-01-27 LAB — CBC WITH DIFFERENTIAL (CANCER CENTER ONLY)
Abs Immature Granulocytes: 0.01 10*3/uL (ref 0.00–0.07)
Basophils Absolute: 0 10*3/uL (ref 0.0–0.1)
Basophils Relative: 1 %
Eosinophils Absolute: 0 10*3/uL (ref 0.0–0.5)
Eosinophils Relative: 1 %
HCT: 34.7 % — ABNORMAL LOW (ref 39.0–52.0)
Hemoglobin: 12.5 g/dL — ABNORMAL LOW (ref 13.0–17.0)
Immature Granulocytes: 0 %
Lymphocytes Relative: 50 %
Lymphs Abs: 1.9 10*3/uL (ref 0.7–4.0)
MCH: 36.7 pg — ABNORMAL HIGH (ref 26.0–34.0)
MCHC: 36 g/dL (ref 30.0–36.0)
MCV: 101.8 fL — ABNORMAL HIGH (ref 80.0–100.0)
Monocytes Absolute: 0.4 10*3/uL (ref 0.1–1.0)
Monocytes Relative: 11 %
Neutro Abs: 1.4 10*3/uL — ABNORMAL LOW (ref 1.7–7.7)
Neutrophils Relative %: 37 %
Platelet Count: 211 10*3/uL (ref 150–400)
RBC: 3.41 MIL/uL — ABNORMAL LOW (ref 4.22–5.81)
RDW: 13.5 % (ref 11.5–15.5)
WBC Count: 3.8 10*3/uL — ABNORMAL LOW (ref 4.0–10.5)
nRBC: 0 % (ref 0.0–0.2)

## 2019-01-27 LAB — MAGNESIUM: Magnesium: 1.6 mg/dL — ABNORMAL LOW (ref 1.7–2.4)

## 2019-02-02 ENCOUNTER — Other Ambulatory Visit: Payer: BLUE CROSS/BLUE SHIELD

## 2019-02-02 ENCOUNTER — Other Ambulatory Visit: Payer: Self-pay | Admitting: Cardiovascular Disease

## 2019-02-03 ENCOUNTER — Telehealth: Payer: Self-pay

## 2019-02-03 ENCOUNTER — Telehealth: Payer: Self-pay | Admitting: Medical Oncology

## 2019-02-03 ENCOUNTER — Other Ambulatory Visit: Payer: Self-pay

## 2019-02-03 ENCOUNTER — Inpatient Hospital Stay: Payer: BC Managed Care – PPO

## 2019-02-03 DIAGNOSIS — C3491 Malignant neoplasm of unspecified part of right bronchus or lung: Secondary | ICD-10-CM

## 2019-02-03 DIAGNOSIS — Z902 Acquired absence of lung [part of]: Secondary | ICD-10-CM | POA: Diagnosis not present

## 2019-02-03 DIAGNOSIS — Z79899 Other long term (current) drug therapy: Secondary | ICD-10-CM | POA: Diagnosis not present

## 2019-02-03 DIAGNOSIS — C3411 Malignant neoplasm of upper lobe, right bronchus or lung: Secondary | ICD-10-CM | POA: Diagnosis not present

## 2019-02-03 LAB — MAGNESIUM: Magnesium: 1.4 mg/dL — CL (ref 1.7–2.4)

## 2019-02-03 LAB — CMP (CANCER CENTER ONLY)
ALT: 19 U/L (ref 0–44)
AST: 26 U/L (ref 15–41)
Albumin: 3.9 g/dL (ref 3.5–5.0)
Alkaline Phosphatase: 69 U/L (ref 38–126)
Anion gap: 10 (ref 5–15)
BUN: 4 mg/dL — ABNORMAL LOW (ref 8–23)
CO2: 26 mmol/L (ref 22–32)
Calcium: 9.1 mg/dL (ref 8.9–10.3)
Chloride: 95 mmol/L — ABNORMAL LOW (ref 98–111)
Creatinine: 0.78 mg/dL (ref 0.61–1.24)
GFR, Est AFR Am: >60 mL/min (ref >60)
GFR, Estimated: >60 mL/min (ref >60)
Glucose, Bld: 101 mg/dL — ABNORMAL HIGH (ref 70–99)
Potassium: 4.9 mmol/L (ref 3.5–5.1)
Sodium: 131 mmol/L — ABNORMAL LOW (ref 135–145)
Total Bilirubin: 0.4 mg/dL (ref 0.3–1.2)
Total Protein: 7.2 g/dL (ref 6.5–8.1)

## 2019-02-03 LAB — CBC WITH DIFFERENTIAL (CANCER CENTER ONLY)
Abs Immature Granulocytes: 0.02 10*3/uL (ref 0.00–0.07)
Basophils Absolute: 0 10*3/uL (ref 0.0–0.1)
Basophils Relative: 0 %
Eosinophils Absolute: 0 10*3/uL (ref 0.0–0.5)
Eosinophils Relative: 1 %
HCT: 36 % — ABNORMAL LOW (ref 39.0–52.0)
Hemoglobin: 12.6 g/dL — ABNORMAL LOW (ref 13.0–17.0)
Immature Granulocytes: 0 %
Lymphocytes Relative: 42 %
Lymphs Abs: 2.2 10*3/uL (ref 0.7–4.0)
MCH: 36.6 pg — ABNORMAL HIGH (ref 26.0–34.0)
MCHC: 35 g/dL (ref 30.0–36.0)
MCV: 104.7 fL — ABNORMAL HIGH (ref 80.0–100.0)
Monocytes Absolute: 0.6 10*3/uL (ref 0.1–1.0)
Monocytes Relative: 11 %
Neutro Abs: 2.4 10*3/uL (ref 1.7–7.7)
Neutrophils Relative %: 46 %
Platelet Count: 230 10*3/uL (ref 150–400)
RBC: 3.44 MIL/uL — ABNORMAL LOW (ref 4.22–5.81)
RDW: 13.9 % (ref 11.5–15.5)
WBC Count: 5.2 10*3/uL (ref 4.0–10.5)
nRBC: 0 % (ref 0.0–0.2)

## 2019-02-03 NOTE — Telephone Encounter (Signed)
Contacted patient to follow up and make sure he received the earlier message to increase the Mag Oxide to 4x/day. Patient stated that his spouse made him aware and he stated that he takes the morning dose each day but usually forgets to take the other doses throughout the day. Discussed the SE of hypomag and patient stated that he understands as he has had to be hospitalized in the past for this. Patient verbalized understanding and stated that he will try really hard to take 4 doses a day.

## 2019-02-03 NOTE — Telephone Encounter (Signed)
Low magnesium- LVM for pt to return call . He needs to increase mag oxide to qid.

## 2019-02-05 ENCOUNTER — Other Ambulatory Visit: Payer: Self-pay | Admitting: Physician Assistant

## 2019-02-09 ENCOUNTER — Ambulatory Visit: Payer: BLUE CROSS/BLUE SHIELD | Admitting: Physician Assistant

## 2019-02-09 ENCOUNTER — Ambulatory Visit: Payer: BLUE CROSS/BLUE SHIELD

## 2019-02-09 ENCOUNTER — Other Ambulatory Visit: Payer: BLUE CROSS/BLUE SHIELD

## 2019-02-10 ENCOUNTER — Inpatient Hospital Stay: Payer: BC Managed Care – PPO

## 2019-02-10 ENCOUNTER — Other Ambulatory Visit: Payer: Self-pay

## 2019-02-10 DIAGNOSIS — Z79899 Other long term (current) drug therapy: Secondary | ICD-10-CM | POA: Diagnosis not present

## 2019-02-10 DIAGNOSIS — Z902 Acquired absence of lung [part of]: Secondary | ICD-10-CM | POA: Diagnosis not present

## 2019-02-10 DIAGNOSIS — C3411 Malignant neoplasm of upper lobe, right bronchus or lung: Secondary | ICD-10-CM | POA: Diagnosis not present

## 2019-02-10 DIAGNOSIS — C3491 Malignant neoplasm of unspecified part of right bronchus or lung: Secondary | ICD-10-CM

## 2019-02-10 LAB — CMP (CANCER CENTER ONLY)
ALT: 12 U/L (ref 0–44)
AST: 24 U/L (ref 15–41)
Albumin: 3.9 g/dL (ref 3.5–5.0)
Alkaline Phosphatase: 69 U/L (ref 38–126)
Anion gap: 10 (ref 5–15)
BUN: 5 mg/dL — ABNORMAL LOW (ref 8–23)
CO2: 25 mmol/L (ref 22–32)
Calcium: 9 mg/dL (ref 8.9–10.3)
Chloride: 96 mmol/L — ABNORMAL LOW (ref 98–111)
Creatinine: 0.75 mg/dL (ref 0.61–1.24)
GFR, Est AFR Am: >60 mL/min (ref >60)
GFR, Estimated: >60 mL/min (ref >60)
Glucose, Bld: 111 mg/dL — ABNORMAL HIGH (ref 70–99)
Potassium: 4.6 mmol/L (ref 3.5–5.1)
Sodium: 131 mmol/L — ABNORMAL LOW (ref 135–145)
Total Bilirubin: 0.4 mg/dL (ref 0.3–1.2)
Total Protein: 7.2 g/dL (ref 6.5–8.1)

## 2019-02-10 LAB — CBC WITH DIFFERENTIAL (CANCER CENTER ONLY)
Abs Immature Granulocytes: 0.01 10*3/uL (ref 0.00–0.07)
Basophils Absolute: 0 10*3/uL (ref 0.0–0.1)
Basophils Relative: 1 %
Eosinophils Absolute: 0.1 10*3/uL (ref 0.0–0.5)
Eosinophils Relative: 2 %
HCT: 34.5 % — ABNORMAL LOW (ref 39.0–52.0)
Hemoglobin: 12.2 g/dL — ABNORMAL LOW (ref 13.0–17.0)
Immature Granulocytes: 0 %
Lymphocytes Relative: 46 %
Lymphs Abs: 1.9 10*3/uL (ref 0.7–4.0)
MCH: 36.3 pg — ABNORMAL HIGH (ref 26.0–34.0)
MCHC: 35.4 g/dL (ref 30.0–36.0)
MCV: 102.7 fL — ABNORMAL HIGH (ref 80.0–100.0)
Monocytes Absolute: 0.6 10*3/uL (ref 0.1–1.0)
Monocytes Relative: 16 %
Neutro Abs: 1.4 10*3/uL — ABNORMAL LOW (ref 1.7–7.7)
Neutrophils Relative %: 35 %
Platelet Count: 341 10*3/uL (ref 150–400)
RBC: 3.36 MIL/uL — ABNORMAL LOW (ref 4.22–5.81)
RDW: 13.9 % (ref 11.5–15.5)
WBC Count: 4.1 10*3/uL (ref 4.0–10.5)
nRBC: 0 % (ref 0.0–0.2)

## 2019-02-10 LAB — MAGNESIUM: Magnesium: 1.5 mg/dL — ABNORMAL LOW (ref 1.7–2.4)

## 2019-02-16 ENCOUNTER — Ambulatory Visit (HOSPITAL_COMMUNITY)
Admission: RE | Admit: 2019-02-16 | Discharge: 2019-02-16 | Disposition: A | Discharge status: Home / Self Care | Payer: BC Managed Care – PPO | Source: Ambulatory Visit | Attending: Physician Assistant | Admitting: Physician Assistant

## 2019-02-16 ENCOUNTER — Other Ambulatory Visit: Payer: Self-pay

## 2019-02-16 ENCOUNTER — Encounter (HOSPITAL_COMMUNITY): Payer: Self-pay

## 2019-02-16 DIAGNOSIS — C3491 Malignant neoplasm of unspecified part of right bronchus or lung: Secondary | ICD-10-CM | POA: Insufficient documentation

## 2019-02-16 DIAGNOSIS — R918 Other nonspecific abnormal finding of lung field: Secondary | ICD-10-CM | POA: Diagnosis not present

## 2019-02-16 DIAGNOSIS — C3490 Malignant neoplasm of unspecified part of unspecified bronchus or lung: Secondary | ICD-10-CM | POA: Diagnosis not present

## 2019-02-16 MED ORDER — IOHEXOL 300 MG/ML  SOLN
75.0000 mL | Freq: Once | INTRAMUSCULAR | Status: AC | PRN
  Administered 2019-02-16: 11:00:00 75 mL via INTRAVENOUS

## 2019-02-16 MED ORDER — SODIUM CHLORIDE (PF) 0.9 % IJ SOLN
INTRAMUSCULAR | Status: AC
  Filled 2019-02-16: qty 50

## 2019-02-18 ENCOUNTER — Telehealth: Payer: Self-pay | Admitting: Internal Medicine

## 2019-02-18 ENCOUNTER — Other Ambulatory Visit: Payer: Self-pay

## 2019-02-18 ENCOUNTER — Inpatient Hospital Stay (HOSPITAL_BASED_OUTPATIENT_CLINIC_OR_DEPARTMENT_OTHER): Payer: BC Managed Care – PPO | Admitting: Internal Medicine

## 2019-02-18 ENCOUNTER — Encounter: Payer: Self-pay | Admitting: Internal Medicine

## 2019-02-18 VITALS — BP 149/94 | HR 67 | Temp 99.1°F | Resp 16 | Ht 68.0 in | Wt 130.8 lb

## 2019-02-18 DIAGNOSIS — C3411 Malignant neoplasm of upper lobe, right bronchus or lung: Secondary | ICD-10-CM | POA: Diagnosis not present

## 2019-02-18 DIAGNOSIS — Z79899 Other long term (current) drug therapy: Secondary | ICD-10-CM

## 2019-02-18 DIAGNOSIS — Z902 Acquired absence of lung [part of]: Secondary | ICD-10-CM | POA: Diagnosis not present

## 2019-02-18 DIAGNOSIS — C3491 Malignant neoplasm of unspecified part of right bronchus or lung: Secondary | ICD-10-CM

## 2019-02-18 DIAGNOSIS — I10 Essential (primary) hypertension: Secondary | ICD-10-CM

## 2019-02-18 DIAGNOSIS — C3490 Malignant neoplasm of unspecified part of unspecified bronchus or lung: Secondary | ICD-10-CM

## 2019-02-18 NOTE — Telephone Encounter (Signed)
Scheduled appt per 7/23 los - reminder letter mailed with appt date and time . Central radiology to contact patient with ct scan

## 2019-02-18 NOTE — Progress Notes (Signed)
[facility] [phone]   [phone]  OFFICE PROGRESS NOTE  [name], NP [place] [address]  DIAGNOSIS: Stage IIIa (T3, N1, M0) non-small cell lung cancer, adenocarcinoma with no actionable mutations presented with multiple pulmonary nodules in the right upper lobe as well as metastatic disease in intraparenchymal lymphadenopathy.  This was diagnosed in February 2020.  PRIOR THERAPY: 1) Status post right upper lobectomy with lymph node dissection. 2) Adjuvant systemic chemotherapy with cisplatin 75 mg/M2 and Alimta 500 mg/M2 every 3 weeks.  Status post 4 cycles.  CURRENT THERAPY: Observation.  INTERVAL HISTORY: Sean Rose 63 y.o. male returns to the clinic today for follow-up visit.  The patient is feeling fine today with no concerning complaints set for mild fatigue.  He denied having any chest pain, shortness of breath, cough or hemoptysis.  He denied having any fever or chills.  He has no nausea, vomiting, diarrhea or constipation.  He denied having any headache or visual changes.  He tolerated the previous course of adjuvant systemic chemotherapy with cisplatin and Alimta fairly well.  The patient had repeat CT scan of the chest performed recently and he is here for evaluation and discussion of his scan results.  MEDICAL HISTORY: Past Medical History:  Diagnosis Date  . Anginal pain ([place])   . CAD (coronary artery disease)   . CAP (community acquired pneumonia) 09/2016  . COPD (chronic obstructive pulmonary disease) ([place])   . GERD (gastroesophageal reflux disease)   . Heart murmur    "I was told I had one when I was a kid"  . Hemorrhoids   . History of anal fissures    "no surgeries" (10/30/2016)  . Hyperlipidemia   . Hypertension   . Peripheral arterial disease ([place])    status post right common iliac artery stenting back in 2007  . Seasonal allergies   . Tobacco abuse     ALLERGIES:  is allergic to  tyloxapol.  MEDICATIONS:  Current Outpatient Medications  Medication Sig Dispense Refill  . amLODipine (NORVASC) 5 MG tablet Take 1 tablet (5 mg total) by mouth daily. 30 tablet 1  . aspirin EC 81 MG tablet Take 81 mg by mouth daily.    . clopidogrel (PLAVIX) 75 MG tablet TAKE 1 TABLET BY MOUTH EVERY DAY (Patient taking differently: Take 75 mg by mouth daily. ) 90 tablet 1  . dexamethasone (DECADRON) 4 MG tablet One tab po bid the day before, day of and day after chemo every 3 weeks. 40 tablet 0  . diphenhydrAMINE (BENADRYL) 25 mg capsule Take 25 mg by mouth daily as needed for allergies.     . folic acid (FOLVITE) 1 MG tablet Take 1 tablet (1 mg total) by mouth daily. 30 tablet 4  . ibuprofen (ADVIL,MOTRIN) 200 MG tablet Take 400 mg by mouth every 6 (six) hours as needed for headache or moderate pain.     . magnesium oxide (MAG-OX) 400 (241.3 Mg) MG tablet 1 tablet p.o. 3 times a day 90 tablet 1  . metoprolol succinate (TOPROL-XL) 100 MG 24 hr tablet TAKE 1 TABLET BY MOUTH EVERY DAY 90 tablet 2  . Multiple Vitamin (MULTIVITAMIN WITH MINERALS) TABS tablet Take 1 tablet by mouth daily.    Marland Kitchen oxyCODONE (OXY IR/ROXICODONE) 5 MG immediate release tablet Take 1 tablet (5 mg total) by mouth every 6 (six) hours as needed for moderate pain. (Patient not taking: Reported on 01/19/2019) 20 tablet  0  . prochlorperazine (COMPAZINE) 10 MG tablet Take 1 tablet (10 mg total) by mouth every 6 (six) hours as needed for nausea or vomiting. (Patient not taking: Reported on 12/07/2018) 30 tablet 0  . umeclidinium-vilanterol (ANORO ELLIPTA) 62.5-25 MCG/INH AEPB Inhale 1 puff into the lungs daily. 60 each 3   No current facility-administered medications for this visit.     SURGICAL HISTORY:  Past Surgical History:  Procedure Laterality Date  . ANKLE SURGERY Left    "rebuilt it"  . ANTERIOR CRUCIATE LIGAMENT REPAIR Right   . CARDIAC CATHETERIZATION  09/23/2008   Continued medical therapy - may need GI evaluation  in addition.  Marland Kitchen CARDIAC CATHETERIZATION  10/28/2007   Medical therapy recommended.  Marland Kitchen CARDIAC CATHETERIZATION  11/18/2006   In-stent restenosis RCA  (50% distal edge, 80% segmental mid, and 50-60% segmental proximal). Successful cutting balloon atherectomy using a [id number] cutting balloon. 3 inflations with atherectomy performed on mid and proximal portions resulting in reduction of 80% mid in-stent restenosis to less than 20% residual and 50-60% segmental proximal to less than 20% residual without dissection.  Marland Kitchen CARDIAC CATHETERIZATION  02/26/2006   Severe stenosis in RCA. Stenting performed using IVUS. 3.5x20 Maverick balloon deployed at [employer]. Distal stent-a 4x28 Liberte stent-deployed 12atm 48sec, 12atm 31sec, 4atm 19sec. Mid stent-a 4x28 Liberte stent-deployed 14atm 45sec, 14atm 60sec, 14atm 44sec. Proximal stent-4x8 Liberte- 14atm 45sec,14atm 47sec, 16atm 43sec. Severely diseased segment then appeared TIMI-3 flow.  Marland Kitchen CARDIOVASCULAR STRESS TEST  11/17/2012   No significant ECG changes. Septal perfusion defect is new when complared to study from 2010. Abnormal myocardial perfusion imaging with a basal to mid perfusion suggestive of previous MI.  Marland Kitchen CAROTID DOPPLER  08/09/2011   Bilateral Bulb/Proximal ICA - demonstrated a mild amount of fibrous plaque without evidence of significant diameter reduction reduction or other vascular abnormality.  . CHEST TUBE INSERTION Right 09/02/2018   Procedure: Chest Tube Insertion;  Surgeon: Garner Nash, DO;  Location: [facility];  Service: Thoracic;  Laterality: Right;  . COLONOSCOPY     2003, 2014  . CORONARY ANGIOPLASTY WITH STENT PLACEMENT    . FEMORAL ARTERY STENT    . INGUINAL HERNIA REPAIR Right   . KNEE ARTHROSCOPY Right "multiple"  . LAPAROSCOPIC APPENDECTOMY N/A 12/07/2018   Procedure: APPENDECTOMY LAPAROSCOPIC;  Surgeon: Coralie Keens, MD;  Location: [facility];  Service: General;  Laterality: N/A;  . LOWER EXTREMITY ARTERIAL DOPPLER  01/31/2011    Bilateral ABIs-normal values with no suggestion of arterial insuff to the lower extremities at rest. Right CIA stent-mild amount of nonhemodynamically significant plaque is noted throughout  . 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 PERCUTANEOUS STENT INTERVENTION  04/04/2006 & 04/13/2015   a. Right common iliac artery with an 8.0x18 mm Herculink stent deployed at 12 atm. Stenosis was reduced from 80% to 0% with brisk flow. b. I-cast stenting to left common iliac artery  . PERIPHERAL VASCULAR CATHETERIZATION N/A 04/13/2015   Procedure: Lower Extremity Angiography;  Surgeon: Lorretta Harp, MD; L-oCIA 75%, 40-50% L-EIA, R-CIA stent patent, s/p 8 mm x 38 mm ICast covered stent>>0% stenosis in L-oCIA     . SHOULDER ARTHROSCOPY WITH ROTATOR CUFF REPAIR Right   . TRANSTHORACIC ECHOCARDIOGRAM  11/26/2012   EF not noted. Aortic valve-sclerosis without stenosis, no regurgiation.   Marland Kitchen UPPER GASTROINTESTINAL ENDOSCOPY    . US CAROTID DOPPLER BILATERAL ([facility])  08/09/2011   Bilateral Bulb/Proximal ICAa demonstrated a mild amount of fibrous plaque without  evidence of significant diameter reduction or any other vascular abnormality.  Marland Kitchen VIDEO ASSISTED THORACOSCOPY (VATS)/ LOBECTOMY Right 09/14/2018   Procedure: RIGHT VIDEO ASSISTED THORACOSCOPY (VATS)/ RIGHT UPPER LOBECTOMY;  Surgeon: Melrose Nakayama, MD;  Location: [facility];  Service: Thoracic;  Laterality: Right;  Marland Kitchen VIDEO BRONCHOSCOPY WITH ENDOBRONCHIAL NAVIGATION N/A 09/02/2018   Procedure: VIDEO BRONCHOSCOPY WITH ENDOBRONCHIAL NAVIGATION;  Surgeon: Garner Nash, DO;  Location: [facility];  Service: Thoracic;  Laterality: N/A;    REVIEW OF SYSTEMS:  Constitutional: positive for fatigue Eyes: negative Ears, nose, mouth, throat, and face: negative Respiratory: negative Cardiovascular: negative Gastrointestinal: negative Genitourinary:negative Integument/breast: negative Hematologic/lymphatic: negative Musculoskeletal:negative Neurological:  negative Behavioral/Psych: negative Endocrine: negative Allergic/Immunologic: negative   PHYSICAL EXAMINATION: General appearance: alert, cooperative, fatigued and no distress Head: Normocephalic, without obvious abnormality, atraumatic Neck: no adenopathy, no JVD, supple, symmetrical, trachea midline and thyroid not enlarged, symmetric, no tenderness/mass/nodules Lymph nodes: Cervical, supraclavicular, and axillary nodes normal. Resp: clear to auscultation bilaterally Back: symmetric, no curvature. ROM normal. No CVA tenderness. Cardio: regular rate and rhythm, S1, S2 normal, no murmur, click, rub or gallop GI: soft, non-tender; bowel sounds normal; no masses,  no organomegaly Extremities: extremities normal, atraumatic, no cyanosis or edema Neurologic: Alert and oriented X 3, normal strength and tone. Normal symmetric reflexes. Normal coordination and gait  ECOG PERFORMANCE STATUS: 1 - Symptomatic but completely ambulatory  Blood pressure (!) 149/94, pulse 67, temperature 99.1 F (37.3 C), temperature source Temporal, resp. rate 16, height _0  (1.727 m), weight 130 lb 12.8 oz (59.3 kg), SpO2 99 %.  LABORATORY DATA: Lab Results  Component Value Date   WBC 4.1 02/10/2019   HGB 12.2 (L) 02/10/2019   HCT 34.5 (L) 02/10/2019   MCV 102.7 (H) 02/10/2019   PLT 341 02/10/2019      Chemistry      Component Value Date/Time   NA 131 (L) 02/10/2019 0929   NA 130 (L) 03/05/2017 1110   K 4.6 02/10/2019 0929   CL 96 (L) 02/10/2019 0929   CO2 25 02/10/2019 0929   BUN 5 (L) 02/10/2019 0929   BUN 4 (L) 03/05/2017 1110   CREATININE 0.75 02/10/2019 0929   CREATININE 0.58 (L) 05/22/2015 1041      Component Value Date/Time   CALCIUM 9.0 02/10/2019 0929   ALKPHOS 69 02/10/2019 0929   AST 24 02/10/2019 0929   ALT 12 02/10/2019 0929   BILITOT 0.4 02/10/2019 0929       RADIOGRAPHIC STUDIES: Ct Chest W Contrast  Result Date: 02/16/2019 CLINICAL DATA:  Lung cancer restaging. EXAM: CT  CHEST WITH CONTRAST TECHNIQUE: Multidetector CT imaging of the chest was performed during intravenous contrast administration. CONTRAST:  57m OMNIPAQUE IOHEXOL 300 MG/ML  SOLN COMPARISON:  08/20/2018 FINDINGS: Cardiovascular: The heart size is normal. No substantial pericardial effusion. Coronary artery calcification is evident. Atherosclerotic calcification is noted in the wall of the thoracic aorta. No mediastinal lymphadenopathy. There is no hilar lymphadenopathy. The esophagus has normal imaging features. There is no axillary lymphadenopathy. Mediastinum/Nodes: No mediastinal lymphadenopathy. There is no hilar lymphadenopathy. The esophagus has normal imaging features. There is no axillary lymphadenopathy. Lungs/Pleura: Centrilobular and paraseptal emphysema evident. Status post right upper lobectomy. 3 mm left upper lobe nodule on 69/7 is stable in the interval. 4 mm left upper lobe nodule on 97/7 is stable. No new suspicious nodule or mass. No pleural effusion. Upper Abdomen: Stable right adrenal adenoma. 7 low-density lesion posterior right liver is unchanged. Musculoskeletal: No worrisome lytic or sclerotic  osseous abnormality. Stable mild superior endplate compression deformity at T11. IMPRESSION: 1. Interval right upper lobectomy. 2. Tiny left pulmonary nodules are unchanged in the interval. No new pulmonary nodule or mass. 3. No lymphadenopathy in the thorax. 4.  Emphysema. (ICD10-J43.9) 5.  Aortic Atherosclerois (ICD10-170.0) Electronically Signed   By: [name] M.D.   On: 02/16/2019 16:45    ASSESSMENT AND PLAN: This is a very pleasant 63 years old white male with a stage IIIB non-small cell lung cancer, adenocarcinoma with no actionable mutations status post right upper lobectomy with lymph node dissection under the care of [name]. The patient completed a course of adjuvant treatment with cisplatin and Alimta status post 4 cycles.  He tolerated the previous 4 cycles of his treatment  fairly well. The patient is feeling fine today with no concerning complaints except for mild fatigue. He had repeat CT scan of the chest performed recently.  I personally and independently reviewed the scans and discussed the results with the patient today. His scan showed no concerning findings for disease progression. I recommended for the patient to continue on observation with repeat CT scan of the chest in 4 months. For the hypomagnesemia he was advised to continue on the magnesium oxide for now. The patient was advised to call immediately if he has any other concerning symptoms in the interval. The patient voices understanding of current disease status and treatment options and is in agreement with the current care plan. All questions were answered. The patient knows to call the clinic with any problems, questions or concerns. We can certainly see the patient much sooner if necessary.  Disclaimer: This note was dictated with voice recognition software. Similar sounding words can inadvertently be transcribed and may not be corrected upon review.

## 2019-03-01 ENCOUNTER — Other Ambulatory Visit: Payer: Self-pay | Admitting: Internal Medicine

## 2019-03-02 ENCOUNTER — Telehealth: Payer: Self-pay | Admitting: Medical Oncology

## 2019-03-02 NOTE — Telephone Encounter (Signed)
New intermittent numbness /tingling ;left side- (L leg, and foot , and lips). Started a couple of days ago. He feels it more when he is sitting. He does not feel the numbness  as much when he stands up.  Reports a headache and a sore throat "seasonal allergies". He is taking mag oxide 800 mg /day. Speech clear , having normal BM /voiding .   I told pt these are stroke-like symptoms and per [name] I instructed pt call 911 or have someone take him to [facility] ED now. He said " I will think about it , I will call my wife".

## 2019-03-30 ENCOUNTER — Encounter: Payer: Self-pay | Admitting: Physician Assistant

## 2019-03-30 ENCOUNTER — Other Ambulatory Visit: Payer: Self-pay

## 2019-03-30 ENCOUNTER — Ambulatory Visit (INDEPENDENT_AMBULATORY_CARE_PROVIDER_SITE_OTHER): Payer: BC Managed Care – PPO | Admitting: Physician Assistant

## 2019-03-30 VITALS — BP 144/84 | HR 66 | Temp 98.1°F | Ht 69.0 in | Wt 141.0 lb

## 2019-03-30 DIAGNOSIS — I10 Essential (primary) hypertension: Secondary | ICD-10-CM | POA: Diagnosis not present

## 2019-03-30 DIAGNOSIS — R600 Localized edema: Secondary | ICD-10-CM | POA: Diagnosis not present

## 2019-03-30 DIAGNOSIS — I739 Peripheral vascular disease, unspecified: Secondary | ICD-10-CM

## 2019-03-30 DIAGNOSIS — R202 Paresthesia of skin: Secondary | ICD-10-CM

## 2019-03-30 DIAGNOSIS — R200 Anesthesia of skin: Secondary | ICD-10-CM | POA: Diagnosis not present

## 2019-03-30 DIAGNOSIS — E7800 Pure hypercholesterolemia, unspecified: Secondary | ICD-10-CM

## 2019-03-30 DIAGNOSIS — Z720 Tobacco use: Secondary | ICD-10-CM

## 2019-03-30 DIAGNOSIS — I2510 Atherosclerotic heart disease of native coronary artery without angina pectoris: Secondary | ICD-10-CM

## 2019-03-30 DIAGNOSIS — R5383 Other fatigue: Secondary | ICD-10-CM | POA: Diagnosis not present

## 2019-03-30 DIAGNOSIS — J449 Chronic obstructive pulmonary disease, unspecified: Secondary | ICD-10-CM

## 2019-03-30 LAB — TSH: TSH: 1.05 u[IU]/mL (ref 0.450–4.500)

## 2019-03-30 MED ORDER — AMLODIPINE BESYLATE 10 MG PO TABS: 10.0000 mg | ORAL_TABLET | Freq: Every day | ORAL | 0 refills | Status: DC

## 2019-03-30 NOTE — Patient Instructions (Addendum)
Medication Instructions:   INCREASE Amlodipine to 10 mg take every night   If you need a refill on your cardiac medications before your next appointment, please call your pharmacy.   Lab work: You will need to have labs (blood work) drawn today:  TSH If you have labs (blood work) drawn today and your tests are completely normal, you will receive your results only by: Marland Kitchen MyChart Message (if you have MyChart) OR . A paper copy in the mail If you have any lab test that is abnormal or we need to change your treatment, we will call you to review the results.  Testing/Procedures: NONE ordered at this time of appointment   Follow-Up: At [facility], you and your health needs are our priority.  As part of our continuing mission to provide you with exceptional heart care, we have created designated Provider Care Teams.  These Care Teams include your primary Cardiologist (physician) and Advanced Practice Providers (APPs -  Physician Assistants and Nurse Practitioners) who all work together to provide you with the care you need, when you need it. . Your physician recommends that you schedule a follow-up appointment in: 3-4 weeks with Sean Deforest, PA-C  Any Other Special Instructions Will Be Listed Below (If Applicable).

## 2019-03-30 NOTE — Progress Notes (Signed)
Cardiology Office Note    Date:  03/31/2019   ID:  [name], [name] [birth year], MRN [id number]  PCP:  Sean Peng, NP  Cardiologist:  Sean Rose  Chief Complaint  Patient presents with  . Other    Patient c/o swelling in ankles. Meds reviewed verbally with patient.     History of Present Illness:  Sean Rose is a 63 y.o. male with PMH of CAD, COPD, hypertension, hyperlipidemia, tobacco abuse and PAD.  He had remote history of RCA stent.  He then developed in-stent restenosis requiring redilatation.  He also had stenting of right common iliac artery in 2007.  Last lower extremity angiography was in September 2016 which revealed widely patent right iliac stent was 75% ostial left common iliac artery stenosis treated with ICast covered stent.  Repeat Doppler obtained in January 2018 revealed a normal ABI bilaterally with patent stent.  Most recent lower extremity ABI in January 2020 was normal as well.  Aorta ultrasound obtained on 08/17/2018 showed largest aortic diameter was 2.7 cm in the supra celiac segment, distal aorta measuring 2.4 x 2.5 cm, left external iliac artery had greater than 50% stenosis.  Repeat ultrasound in 12 months was recommended.    He was seen in the ED in December 2019 with syncope.  CT angiogram of the chest was negative for PE however does have findings concerning for bronchogenic carcinoma.  It was strongly recommended patient get admitted to the hospital for further evaluation, however he left AMA.  He underwent PET scan and eventually right video-assisted thoracoscopy with right upper lobectomy by Sean Rose in February 2020.  Final biopsy demonstrated malignant cells consistent with non-small cell carcinoma.  He he was diagnosed with acute appendicitis in May and he eventually underwent appendectomy by Sean Rose on 12/07/2018.  He is being followed by Sean Rose and underwent adjuvant systemic chemotherapy with cisplatin and Alimta.  Patient presents  today for cardiology office visit complaining of several issues.  He says he did not exactly have chest pain back in December with syncope as documented by ED.  He had another episode of syncope in the distant past however this was the only episode in the past year.  He has not had any significant dizziness since.  He does feel chronically fatigued even though he does get enough sleep.  There is no recent significant anemia or electrolyte imbalance other than chronically low magnesium.  I plan to obtain a TSH as his last thyroid check was in 2018.  His blood pressure has been elevated lately, initial blood pressure on arrival was 144/84 however on manual check by myself it was 138/80.  I will increase amlodipine to 10 mg daily and switch to nighttime only.  I plan to see the patient back in 3 to 4 weeks for reassessment.  He did have an episode of tingling sensation and weakness on the left side and contacted oncology service about a month ago for the symptom.  He was instructed to go to the ED which he did not.  I am not sure if this is related to compressed nerve versus stroke versus neuropathy.  I advised him to discuss this with his PCP.  He does have significant knee pain, this is likely arthritic and not a blood flow issue.  He also mentions he has more swelling near the end of the day.  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  I suspect this is more dependent edema related to venous pooling, I recommended leg elevation during the day.   Past Medical History:  Diagnosis Date  . Anginal pain ([place])   . CAD (coronary artery disease)   . CAP (community acquired pneumonia) 09/2016  . COPD (chronic obstructive pulmonary disease) ([place])   . GERD (gastroesophageal reflux disease)   . Heart murmur    "I was told I had one when I was a kid"  . Hemorrhoids   . History of anal fissures    "no surgeries" (10/30/2016)  . Hyperlipidemia   . Hypertension   .  Peripheral arterial disease ([place])    status post right common iliac artery stenting back in 2007  . Seasonal allergies   . Tobacco abuse     Past Surgical History:  Procedure Laterality Date  . ANKLE SURGERY Left    "rebuilt it"  . ANTERIOR CRUCIATE LIGAMENT REPAIR Right   . CARDIAC CATHETERIZATION  09/23/2008   Continued medical therapy - may need GI evaluation in addition.  Marland Kitchen CARDIAC CATHETERIZATION  10/28/2007   Medical therapy recommended.  Marland Kitchen CARDIAC CATHETERIZATION  11/18/2006   In-stent restenosis RCA  (50% distal edge, 80% segmental mid, and 50-60% segmental proximal). Successful cutting balloon atherectomy using a [id number] cutting balloon. 3 inflations with atherectomy performed on mid and proximal portions resulting in reduction of 80% mid in-stent restenosis to less than 20% residual and 50-60% segmental proximal to less than 20% residual without dissection.  Marland Kitchen CARDIAC CATHETERIZATION  02/26/2006   Severe stenosis in RCA. Stenting performed using IVUS. 3.5x20 Maverick balloon deployed at [employer]. Distal stent-a 4x28 Liberte stent-deployed 12atm 48sec, 12atm 31sec, 4atm 19sec. Mid stent-a 4x28 Liberte stent-deployed 14atm 45sec, 14atm 60sec, 14atm 44sec. Proximal stent-4x8 Liberte- 14atm 45sec,14atm 47sec, 16atm 43sec. Severely diseased segment then appeared TIMI-3 flow.  Marland Kitchen CARDIOVASCULAR STRESS TEST  11/17/2012   No significant ECG changes. Septal perfusion defect is new when complared to study from 2010. Abnormal myocardial perfusion imaging with a basal to mid perfusion suggestive of previous MI.  Marland Kitchen CAROTID DOPPLER  08/09/2011   Bilateral Bulb/Proximal ICA - demonstrated a mild amount of fibrous plaque without evidence of significant diameter reduction reduction or other vascular abnormality.  . CHEST TUBE INSERTION Right 09/02/2018   Procedure: Chest Tube Insertion;  Surgeon: Sean Nash, DO;  Location: [facility];  Service: Thoracic;  Laterality: Right;  . COLONOSCOPY     2003, 2014   . CORONARY ANGIOPLASTY WITH STENT PLACEMENT    . FEMORAL ARTERY STENT    . INGUINAL HERNIA REPAIR Right   . KNEE ARTHROSCOPY Right "multiple"  . LAPAROSCOPIC APPENDECTOMY N/A 12/07/2018   Procedure: APPENDECTOMY LAPAROSCOPIC;  Surgeon: Coralie Keens, MD;  Location: [facility];  Service: General;  Laterality: N/A;  . LOWER EXTREMITY ARTERIAL DOPPLER  01/31/2011   Bilateral ABIs-normal values with no suggestion of arterial insuff to the lower extremities at rest. Right CIA stent-mild amount of nonhemodynamically significant plaque is noted throughout  . 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 PERCUTANEOUS STENT INTERVENTION  04/04/2006 & 04/13/2015   a. Right common iliac artery with an 8.0x18 mm Herculink stent deployed at 12 atm. Stenosis was reduced from 80% to 0% with brisk flow. b. I-cast stenting to left common iliac artery  . PERIPHERAL VASCULAR CATHETERIZATION N/A 04/13/2015   Procedure: Lower Extremity Angiography;  Surgeon: Lorretta Harp, MD; L-oCIA 75%, 40-50% L-EIA, R-CIA stent patent, s/p 8 mm x  38 mm ICast covered stent>>0% stenosis in L-oCIA     . SHOULDER ARTHROSCOPY WITH ROTATOR CUFF REPAIR Right   . TRANSTHORACIC ECHOCARDIOGRAM  11/26/2012   EF not noted. Aortic valve-sclerosis without stenosis, no regurgiation.   Marland Kitchen UPPER GASTROINTESTINAL ENDOSCOPY    . US CAROTID DOPPLER BILATERAL ([facility])  08/09/2011   Bilateral Bulb/Proximal ICAa demonstrated a mild amount of fibrous plaque without evidence of significant diameter reduction or any other vascular abnormality.  Marland Kitchen VIDEO ASSISTED THORACOSCOPY (VATS)/ LOBECTOMY Right 09/14/2018   Procedure: RIGHT VIDEO ASSISTED THORACOSCOPY (VATS)/ RIGHT UPPER LOBECTOMY;  Surgeon: Melrose Nakayama, MD;  Location: [facility];  Service: Thoracic;  Laterality: Right;  Marland Kitchen VIDEO BRONCHOSCOPY WITH ENDOBRONCHIAL NAVIGATION N/A 09/02/2018   Procedure: VIDEO BRONCHOSCOPY WITH ENDOBRONCHIAL NAVIGATION;  Surgeon: Sean Nash, DO;  Location: [facility];   Service: Thoracic;  Laterality: N/A;    Current Medications: Outpatient Medications Prior to Visit  Medication Sig Dispense Refill  . aspirin EC 81 MG tablet Take 81 mg by mouth daily.    . clopidogrel (PLAVIX) 75 MG tablet TAKE 1 TABLET BY MOUTH EVERY DAY (Patient taking differently: Take 75 mg by mouth daily. ) 90 tablet 1  . dexamethasone (DECADRON) 4 MG tablet One tab po bid the day before, day of and day after chemo every 3 weeks. 40 tablet 0  . diphenhydrAMINE (BENADRYL) 25 mg capsule Take 25 mg by mouth daily as needed for allergies.     . folic acid (FOLVITE) 1 MG tablet Take 1 tablet (1 mg total) by mouth daily. 30 tablet 4  . ibuprofen (ADVIL,MOTRIN) 200 MG tablet Take 400 mg by mouth every 6 (six) hours as needed for headache or moderate pain.     Marland Kitchen MAGNESIUM-OXIDE 400 (241.3 Mg) MG tablet TAKE 1 TABLET BY MOUTH THREE TIMES DAILY 90 tablet 1  . metoprolol succinate (TOPROL-XL) 100 MG 24 hr tablet TAKE 1 TABLET BY MOUTH EVERY DAY 90 tablet 2  . Multiple Vitamin (MULTIVITAMIN WITH MINERALS) TABS tablet Take 1 tablet by mouth daily.    Marland Kitchen umeclidinium-vilanterol (ANORO ELLIPTA) 62.5-25 MCG/INH AEPB Inhale 1 puff into the lungs daily. 60 each 3  . amLODipine (NORVASC) 5 MG tablet Take 1 tablet (5 mg total) by mouth daily. 30 tablet 1  . oxyCODONE (OXY IR/ROXICODONE) 5 MG immediate release tablet Take 1 tablet (5 mg total) by mouth every 6 (six) hours as needed for moderate pain. (Patient not taking: Reported on 01/19/2019) 20 tablet 0  . prochlorperazine (COMPAZINE) 10 MG tablet Take 1 tablet (10 mg total) by mouth every 6 (six) hours as needed for nausea or vomiting. (Patient not taking: Reported on 12/07/2018) 30 tablet 0   No facility-administered medications prior to visit.      Allergies:   Tyloxapol   Social History   Socioeconomic History  . Marital status: Married    Spouse name: Not on file  . Number of children: Not on file  . Years of education: Not on file  . Highest  education level: Not on file  Occupational History  . Not on file  Social Needs  . Financial resource strain: Not on file  . Food insecurity    Worry: Not on file    Inability: Not on file  . Transportation needs    Medical: Not on file    Non-medical: Not on file  Tobacco Use  . Smoking status: Former Smoker    Packs/day: 0.25    Years: 38.00  Pack years: 9.50    Types: Cigarettes    Quit date: 10/19/2018    Years since quitting: 0.4  . Smokeless tobacco: Never Used  . Tobacco comment: pack per day 12.17.19  Substance and Sexual Activity  . Alcohol use: Yes    Alcohol/week: 36.0 standard drinks    Types: 36 Cans of beer per week    Comment: 10/30/2016 "I usually drink 4-8 beers qd"  . Drug use: No  . Sexual activity: Yes  Lifestyle  . Physical activity    Days per week: Not on file    Minutes per session: Not on file  . Stress: Not on file  Relationships  . Social Herbalist on phone: Not on file    Gets together: Not on file    Attends religious service: Not on file    Active member of club or organization: Not on file    Attends meetings of clubs or organizations: Not on file    Relationship status: Not on file  Other Topics Concern  . Not on file  Social History Narrative  . Not on file     Family History:  The patient's family history includes Colon cancer in his mother; Heart disease in his father and paternal grandfather.   ROS:   Please see the history of present illness.    ROS All other systems reviewed and are negative.   PHYSICAL EXAM:   VS:  BP (!) 144/84 (BP Location: Left Arm, Patient Position: Sitting, Cuff Size: Normal)   Pulse 66   Temp 98.1 F (36.7 C)   Ht _0  (1.753 m)   Wt 141 lb (64 kg)   BMI 20.82 kg/m    GEN: Well nourished, well developed, in no acute distress  HEENT: normal  Neck: no JVD, carotid bruits, or masses Cardiac: RRR; no murmurs, rubs, or gallops,no edema  Respiratory:  clear to auscultation  bilaterally, normal work of breathing GI: soft, nontender, nondistended, + BS MS: no deformity or atrophy  Skin: warm and dry, no rash Neuro:  Alert and Oriented x 3, Strength and sensation are intact Psych: euthymic mood, full affect  Wt Readings from Last 3 Encounters:  03/30/19 141 lb (64 kg)  02/18/19 130 lb 12.8 oz (59.3 kg)  01/20/19 131 lb 9.6 oz (59.7 kg)      Studies/Labs Reviewed:   EKG:  EKG is ordered today.  The ekg ordered today demonstrates normal sinus rhythm, no significant ST-T wave changes  Recent Labs: 02/10/2019: ALT 12; BUN 5; Creatinine 0.75; Hemoglobin 12.2; Magnesium 1.5; Platelet Count 341; Potassium 4.6; Sodium 131 03/30/2019: TSH 1.050   Lipid Panel    Component Value Date/Time   CHOL 162 03/05/2017 1110   TRIG 149 03/05/2017 1110   HDL 59 03/05/2017 1110   CHOLHDL 2.7 03/05/2017 1110   CHOLHDL 4.4 12/18/2012 0814   VLDL 45 (H) 12/18/2012 0814   LDLCALC 73 03/05/2017 1110    Additional studies/ records that were reviewed today include:   LE ABI and LE arterial doppler 08/17/2018 Summary: Right: Resting right ankle-brachial index is within normal range. No evidence of significant right lower extremity arterial disease. The right toe-brachial index is abnormal.  Left: Resting left ankle-brachial index is within normal range. No evidence of significant left lower extremity arterial disease. The left toe-brachial index is abnormal.  Summary: Abdominal Aorta: No evidence of an abdominal aortic aneurysm was visualized. The largest aortic measurement is 2.7 cm noted in the  supraceliac segment. Abnormal tapering is evident. The distal aorta is ectatic, measuring 2.4 cm AP x 2.5 cm TRV and is essentially stable when compared to the previous exam.  Patent right common iliac artery stent without restenosis.  Proximal-disal segments of the left common iliac artery stent struts where not visualized due to shadowing; pre and post segments  demonstrate normal triphasic flow suggesting normal elevated velocities throughout the stent.    ASSESSMENT:    1. Fatigue, unspecified type   2. Essential hypertension   3. Numbness and tingling of left arm and leg   4. Leg edema   5. Coronary artery disease involving native coronary artery of native heart without angina pectoris   6. Chronic obstructive pulmonary disease, unspecified COPD type ([place])   7. Pure hypercholesterolemia   8. Tobacco abuse   9. PAD (peripheral artery disease) ([facility])      PLAN:  In order of problems listed above:  1. Fatigue: He has noticed worsening fatigue recently.  Lab work is normal other than low magnesium which is chronic for the patient.  Suspicion for obstructive sleep apnea is low.  Fairly recent CT of the chest obtained on 02/16/2019 showed no evidence of lung cancer despite history of smoking. Obtain TSH  2. Hypertension: Increase amlodipine to 10 mg daily.  3. Leg edema: This is likely dependent edema as he said he in one position for long period of time.  I recommended leg elevation  4. Numbness and tingling of the left arm and leg: He has noticed several occurrences in the past several months.  He called his oncologist office a month ago and was told to go to the ED for evaluation of possible stroke.  He did not go to the ED as instructed.  It is involving his left upper arm and the left lower extremity.  I recommended he discuss this with his primary care provider.  5. CAD: No recent anginal symptoms.  Continue aspirin and Plavix  6. COPD: No exacerbation  7. Hyperlipidemia: We will need to consider addition of statin therapy.  8. Tobacco abuse: Tobacco cessation strongly advised  9. PAD: Stable on recent lower extremity arterial Doppler.  No claudication symptoms.  Leg pain is located near the knee and is likely arthritic pain.    Medication Adjustments/Labs and Tests Ordered: Current medicines are reviewed at length with the patient  today.  Concerns regarding medicines are outlined above.  Medication changes, Labs and Tests ordered today are listed in the Patient Instructions below. Patient Instructions  Medication Instructions:   INCREASE Amlodipine to 10 mg take every night   If you need a refill on your cardiac medications before your next appointment, please call your pharmacy.   Lab work: You will need to have labs (blood work) drawn today:  TSH If you have labs (blood work) drawn today and your tests are completely normal, you will receive your results only by: Marland Kitchen MyChart Message (if you have MyChart) OR . A paper copy in the mail If you have any lab test that is abnormal or we need to change your treatment, we will call you to review the results.  Testing/Procedures: NONE ordered at this time of appointment   Follow-Up: At [facility], you and your health needs are our priority.  As part of our continuing mission to provide you with exceptional heart care, we have created designated Provider Care Teams.  These Care Teams include your primary Cardiologist (physician) and Advanced Practice Providers (APPs -  Physician Assistants and Nurse Practitioners) who all work together to provide you with the care you need, when you need it. . Your physician recommends that you schedule a follow-up appointment in: 3-4 weeks with Almyra Deforest, PA-C  Any Other Special Instructions Will Be Listed Below (If Applicable).       Sean Rose, [place]  03/31/2019 10:35 PM    [facility] [address], [place], [place]  [address] Phone: [phone]; Fax: [phone]

## 2019-04-01 NOTE — Progress Notes (Signed)
Thyroid level ok

## 2019-04-02 ENCOUNTER — Other Ambulatory Visit: Payer: Self-pay | Admitting: Cardiovascular Disease

## 2019-04-06 DIAGNOSIS — M25562 Pain in left knee: Secondary | ICD-10-CM | POA: Diagnosis not present

## 2019-04-08 ENCOUNTER — Other Ambulatory Visit (HOSPITAL_BASED_OUTPATIENT_CLINIC_OR_DEPARTMENT_OTHER): Payer: Self-pay | Admitting: Physician Assistant

## 2019-04-08 DIAGNOSIS — R609 Edema, unspecified: Secondary | ICD-10-CM | POA: Diagnosis not present

## 2019-04-08 DIAGNOSIS — M7989 Other specified soft tissue disorders: Secondary | ICD-10-CM

## 2019-04-09 ENCOUNTER — Other Ambulatory Visit: Payer: Self-pay | Admitting: Cardiovascular Disease

## 2019-04-09 ENCOUNTER — Ambulatory Visit (HOSPITAL_BASED_OUTPATIENT_CLINIC_OR_DEPARTMENT_OTHER)
Admission: RE | Admit: 2019-04-09 | Discharge: 2019-04-09 | Disposition: A | Discharge status: Home / Self Care | Payer: BC Managed Care – PPO | Source: Ambulatory Visit | Attending: Physician Assistant | Admitting: Physician Assistant

## 2019-04-09 ENCOUNTER — Other Ambulatory Visit: Payer: Self-pay

## 2019-04-09 DIAGNOSIS — M7989 Other specified soft tissue disorders: Secondary | ICD-10-CM | POA: Insufficient documentation

## 2019-04-28 ENCOUNTER — Encounter: Payer: Self-pay | Admitting: Physician Assistant

## 2019-04-28 ENCOUNTER — Ambulatory Visit (INDEPENDENT_AMBULATORY_CARE_PROVIDER_SITE_OTHER): Payer: BC Managed Care – PPO | Admitting: Physician Assistant

## 2019-04-28 ENCOUNTER — Other Ambulatory Visit: Payer: Self-pay

## 2019-04-28 VITALS — BP 132/82 | HR 87 | Temp 97.9°F | Ht 68.5 in | Wt 143.8 lb

## 2019-04-28 DIAGNOSIS — I2510 Atherosclerotic heart disease of native coronary artery without angina pectoris: Secondary | ICD-10-CM

## 2019-04-28 DIAGNOSIS — J449 Chronic obstructive pulmonary disease, unspecified: Secondary | ICD-10-CM | POA: Diagnosis not present

## 2019-04-28 DIAGNOSIS — C3491 Malignant neoplasm of unspecified part of right bronchus or lung: Secondary | ICD-10-CM

## 2019-04-28 DIAGNOSIS — Z87891 Personal history of nicotine dependence: Secondary | ICD-10-CM

## 2019-04-28 DIAGNOSIS — I739 Peripheral vascular disease, unspecified: Secondary | ICD-10-CM

## 2019-04-28 DIAGNOSIS — R0609 Other forms of dyspnea: Secondary | ICD-10-CM

## 2019-04-28 NOTE — Progress Notes (Signed)
Cardiology Office Note    Date:  04/29/2019   ID:  Sean Rose, Sean Rose [birth year], MRN [id number]  PCP:  [name], NP  Cardiologist:  Dr. Gwenlyn Found  Chief Complaint  Patient presents with  . Follow-up    followed after BP medication adjustment    History of Present Illness:  RODERT HINCH is a 63 y.o. male with PMH of CAD, COPD, hypertension, hyperlipidemia, tobacco abuse (quit in 2020) and PAD.  He had remote history of RCA stent.  He then developed in-stent restenosis requiring redilatation.  He also had stenting of right common iliac artery in 2007.  Last lower extremity angiography in September 2016 which revealed widely patent right iliac stent with 75% ostial left common iliac artery stenosis treated with ICast covered stent.  Repeat Doppler obtained in January 2018 revealed a normal ABI bilaterally with patent stent.  Most recent lower extremity ABI in January 2020 was normal as well.  Aorta ultrasound obtained on 08/17/2018 showed largest aortic diameter was 2.7 cm in the supra celiac segment, distal aorta measuring 2.4 x 2.5 cm, left external iliac artery had >50% stenosis.  Repeat ultrasound in 12 months was recommended.    He was seen in the ED in December 2019 with syncope.  CT angiogram of the chest was negative for PE however does have findings concerning for bronchogenic carcinoma.  It was strongly recommended patient get admitted to the hospital for further evaluation, however he left AMA.  He underwent PET scan and eventually right video-assisted thoracoscopy with right upper lobectomy by [name] in February 2020.  Final biopsy demonstrated malignant cells consistent with non-small cell carcinoma.  He he was diagnosed with acute appendicitis in May and he eventually underwent appendectomy by Dr. Ninfa Linden on 12/07/2018.  He is being followed by Dr. Earlie Server and underwent adjuvant systemic chemotherapy with cisplatin and Alimta.  I last saw the patient on 03/30/2019,  although ED physician documented chest pain associated with syncope in December 2019, he adamantly denied any episode of chest pain.  He feels chronically fatigued even though he gets enough sleep.  My suspicion for obstructive sleep apnea was low at the time.  Repeat TSH was normal.  His blood pressure was mildly elevated, I increased amlodipine to 10 mg daily and switched it to nighttime.  He also complaining of tingling sensation weakness on the left side which occurred about a month prior to the visit, his oncology nurse recommended him to seek urgent medical attention in the emergency room, which he refused.  I was not sure if it is related to compressed nerve versus stroke versus neuropathy.  I advised him to discuss this with his PCP.  He does have some lower extremity edema which I suspect is dependent edema related to venous pooling.  I recommended leg elevation during the day.  Patient presents today for follow-up.  After amlodipine was increased, his blood pressure has normalized.  He does not have any significant lower extremity edema on physical exam today.  For the past several weeks, he has been experiencing increasing dyspnea on exertion.  He denies any chest pain.  He is curious about what could be causing this as he had quit smoking since earlier this year.  On physical exam, he does not have any crackles, rhonchi or wheezing.  I recommended echocardiogram prior to his next visit.  Given lack of chest discomfort, I did not pursue ischemic work-up however if symptoms persist, we may have to consider  a Myoview.  He is due for a CT of the chest in November for his lung cancer.   Past Medical History:  Diagnosis Date  . Anginal pain ([place])   . CAD (coronary artery disease)   . CAP (community acquired pneumonia) 09/2016  . COPD (chronic obstructive pulmonary disease) ([place])   . GERD (gastroesophageal reflux disease)   . Heart murmur    "I was told I had one when I was a kid"  . Hemorrhoids    . History of anal fissures    "no surgeries" (10/30/2016)  . Hyperlipidemia   . Hypertension   . Peripheral arterial disease ([place])    status post right common iliac artery stenting back in 2007  . Seasonal allergies   . Tobacco abuse     Past Surgical History:  Procedure Laterality Date  . ANKLE SURGERY Left    "rebuilt it"  . ANTERIOR CRUCIATE LIGAMENT REPAIR Right   . CARDIAC CATHETERIZATION  09/23/2008   Continued medical therapy - may need GI evaluation in addition.  Marland Kitchen CARDIAC CATHETERIZATION  10/28/2007   Medical therapy recommended.  Marland Kitchen CARDIAC CATHETERIZATION  11/18/2006   In-stent restenosis RCA  (50% distal edge, 80% segmental mid, and 50-60% segmental proximal). Successful cutting balloon atherectomy using a [id number] cutting balloon. 3 inflations with atherectomy performed on mid and proximal portions resulting in reduction of 80% mid in-stent restenosis to less than 20% residual and 50-60% segmental proximal to less than 20% residual without dissection.  Marland Kitchen CARDIAC CATHETERIZATION  02/26/2006   Severe stenosis in RCA. Stenting performed using IVUS. 3.5x20 Maverick balloon deployed at [employer]. Distal stent-a 4x28 Liberte stent-deployed 12atm 48sec, 12atm 31sec, 4atm 19sec. Mid stent-a 4x28 Liberte stent-deployed 14atm 45sec, 14atm 60sec, 14atm 44sec. Proximal stent-4x8 Liberte- 14atm 45sec,14atm 47sec, 16atm 43sec. Severely diseased segment then appeared TIMI-3 flow.  Marland Kitchen CARDIOVASCULAR STRESS TEST  11/17/2012   No significant ECG changes. Septal perfusion defect is new when complared to study from 2010. Abnormal myocardial perfusion imaging with a basal to mid perfusion suggestive of previous MI.  Marland Kitchen CAROTID DOPPLER  08/09/2011   Bilateral Bulb/Proximal ICA - demonstrated a mild amount of fibrous plaque without evidence of significant diameter reduction reduction or other vascular abnormality.  . CHEST TUBE INSERTION Right 09/02/2018   Procedure: Chest Tube Insertion;  Surgeon: Garner Nash, DO;  Location: [facility];  Service: Thoracic;  Laterality: Right;  . COLONOSCOPY     2003, 2014  . CORONARY ANGIOPLASTY WITH STENT PLACEMENT    . FEMORAL ARTERY STENT    . INGUINAL HERNIA REPAIR Right   . KNEE ARTHROSCOPY Right "multiple"  . LAPAROSCOPIC APPENDECTOMY N/A 12/07/2018   Procedure: APPENDECTOMY LAPAROSCOPIC;  Surgeon: Coralie Keens, MD;  Location: [facility];  Service: General;  Laterality: N/A;  . LOWER EXTREMITY ARTERIAL DOPPLER  01/31/2011   Bilateral ABIs-normal values with no suggestion of arterial insuff to the lower extremities at rest. Right CIA stent-mild amount of nonhemodynamically significant plaque is noted throughout  . 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 PERCUTANEOUS STENT INTERVENTION  04/04/2006 & 04/13/2015   a. Right common iliac artery with an 8.0x18 mm Herculink stent deployed at 12 atm. Stenosis was reduced from 80% to 0% with brisk flow. b. I-cast stenting to left common iliac artery  . PERIPHERAL VASCULAR CATHETERIZATION N/A 04/13/2015   Procedure: Lower Extremity Angiography;  Surgeon: Lorretta Harp, MD; L-oCIA 75%, 40-50% L-EIA, R-CIA stent patent, s/p 8 mm x 38 mm  ICast covered stent>>0% stenosis in L-oCIA     . SHOULDER ARTHROSCOPY WITH ROTATOR CUFF REPAIR Right   . TRANSTHORACIC ECHOCARDIOGRAM  11/26/2012   EF not noted. Aortic valve-sclerosis without stenosis, no regurgiation.   Marland Kitchen UPPER GASTROINTESTINAL ENDOSCOPY    . US CAROTID DOPPLER BILATERAL ([place] [facility]X)  08/09/2011   Bilateral Bulb/Proximal ICAa demonstrated a mild amount of fibrous plaque without evidence of significant diameter reduction or any other vascular abnormality.  Marland Kitchen VIDEO ASSISTED THORACOSCOPY (VATS)/ LOBECTOMY Right 09/14/2018   Procedure: RIGHT VIDEO ASSISTED THORACOSCOPY (VATS)/ RIGHT UPPER LOBECTOMY;  Surgeon: Melrose Nakayama, MD;  Location: [facility];  Service: Thoracic;  Laterality: Right;  Marland Kitchen VIDEO BRONCHOSCOPY WITH ENDOBRONCHIAL NAVIGATION N/A 09/02/2018   Procedure: VIDEO  BRONCHOSCOPY WITH ENDOBRONCHIAL NAVIGATION;  Surgeon: Garner Nash, DO;  Location: [facility];  Service: Thoracic;  Laterality: N/A;    Current Medications: Outpatient Medications Prior to Visit  Medication Sig Dispense Refill  . amLODipine (NORVASC) 10 MG tablet Take 1 tablet (10 mg total) by mouth daily. 90 tablet 0  . aspirin EC 81 MG tablet Take 81 mg by mouth daily.    . clopidogrel (PLAVIX) 75 MG tablet TAKE 1 TABLET BY MOUTH EVERY DAY 90 tablet 0  . dexamethasone (DECADRON) 4 MG tablet One tab po bid the day before, day of and day after chemo every 3 weeks. 40 tablet 0  . diphenhydrAMINE (BENADRYL) 25 mg capsule Take 25 mg by mouth daily as needed for allergies.     . folic acid (FOLVITE) 1 MG tablet Take 1 tablet (1 mg total) by mouth daily. 30 tablet 4  . ibuprofen (ADVIL,MOTRIN) 200 MG tablet Take 400 mg by mouth every 6 (six) hours as needed for headache or moderate pain.     Marland Kitchen MAGNESIUM-OXIDE 400 (241.3 Mg) MG tablet TAKE 1 TABLET BY MOUTH THREE TIMES DAILY 90 tablet 1  . metoprolol succinate (TOPROL-XL) 100 MG 24 hr tablet TAKE 1 TABLET BY MOUTH EVERY DAY 90 tablet 2  . Multiple Vitamin (MULTIVITAMIN WITH MINERALS) TABS tablet Take 1 tablet by mouth daily.    . nitroGLYCERIN (NITROSTAT) 0.4 MG SL tablet PLACE 1 TABLET UNDER THE TONGUE EVERY 5 MINUTES AS NEEDED FOR CHEST PAIN 25 tablet 6  . umeclidinium-vilanterol (ANORO ELLIPTA) 62.5-25 MCG/INH AEPB Inhale 1 puff into the lungs daily. 60 each 3   No facility-administered medications prior to visit.      Allergies:   Tyloxapol   Social History   Socioeconomic History  . Marital status: Married    Spouse name: Not on file  . Number of children: Not on file  . Years of education: Not on file  . Highest education level: Not on file  Occupational History  . Not on file  Social Needs  . Financial resource strain: Not on file  . Food insecurity    Worry: Not on file    Inability: Not on file  . Transportation needs     Medical: Not on file    Non-medical: Not on file  Tobacco Use  . Smoking status: Former Smoker    Packs/day: 0.25    Years: 38.00    Pack years: 9.50    Types: Cigarettes    Quit date: 10/19/2018    Years since quitting: 0.5  . Smokeless tobacco: Never Used  . Tobacco comment: pack per day 12.17.19  Substance and Sexual Activity  . Alcohol use: Yes    Alcohol/week: 36.0 standard drinks    Types:  36 Cans of beer per week    Comment: 10/30/2016 "I usually drink 4-8 beers qd"  . Drug use: No  . Sexual activity: Yes  Lifestyle  . Physical activity    Days per week: Not on file    Minutes per session: Not on file  . Stress: Not on file  Relationships  . Social Herbalist on phone: Not on file    Gets together: Not on file    Attends religious service: Not on file    Active member of club or organization: Not on file    Attends meetings of clubs or organizations: Not on file    Relationship status: Not on file  Other Topics Concern  . Not on file  Social History Narrative  . Not on file     Family History:  The patient's family history includes Colon cancer in his mother; Heart disease in his father and paternal grandfather.   ROS:   Please see the history of present illness.    ROS All other systems reviewed and are negative.   PHYSICAL EXAM:   VS:  BP 132/82   Pulse 87   Temp 97.9 F (36.6 C)   Ht 5' 8.5" (1.74 m)   Wt 143 lb 12.8 oz (65.2 kg)   SpO2 99%   BMI 21.55 kg/m    GEN: Well nourished, well developed, in no acute distress  HEENT: normal  Neck: no JVD, carotid bruits, or masses Cardiac: RRR; no murmurs, rubs, or gallops,no edema  Respiratory:  clear to auscultation bilaterally, normal work of breathing GI: soft, nontender, nondistended, + BS MS: no deformity or atrophy  Skin: warm and dry, no rash Neuro:  Alert and Oriented x 3, Strength and sensation are intact Psych: euthymic mood, full affect  Wt Readings from Last 3 Encounters:   04/28/19 143 lb 12.8 oz (65.2 kg)  03/30/19 141 lb (64 kg)  02/18/19 130 lb 12.8 oz (59.3 kg)      Studies/Labs Reviewed:   EKG:  EKG is not ordered today.   Recent Labs: 02/10/2019: ALT 12; BUN 5; Creatinine 0.75; Hemoglobin 12.2; Magnesium 1.5; Platelet Count 341; Potassium 4.6; Sodium 131 03/30/2019: TSH 1.050   Lipid Panel    Component Value Date/Time   CHOL 162 03/05/2017 1110   TRIG 149 03/05/2017 1110   HDL 59 03/05/2017 1110   CHOLHDL 2.7 03/05/2017 1110   CHOLHDL 4.4 12/18/2012 0814   VLDL 45 (H) 12/18/2012 0814   LDLCALC 73 03/05/2017 1110    Additional studies/ records that were reviewed today include:   Echo 11/26/2012 Study Conclusions   - Left ventricle: The cavity size was normal. Wall thickness  was normal. Wall motion was normal; there were no regional  wall motion abnormalities. The transmitral flow pattern  was normal. The deceleration time of the early transmitral  flow velocity was normal. The pulmonary vein flow pattern  was normal. The tissue Doppler parameters were mildly  abnormal, with an A wave predominance. Left ventricular  diastolic function parameters are borderline abnormal. LV  filling pressure is normal.  - Aortic valve: Sclerosis without stenosis. No  regurgitation.  - Mitral valve: Structurally normal valve. No significant  regurgitation.  - Left atrium: LA Volume/BSA 25.6 ml/m2. The atrium was  normal in size.  - Inferior vena cava: The vessel was normal in size; the  respirophasic diameter changes were in the normal range (=  50%); findings are consistent with normal central venous  pressure.     ASSESSMENT:    1. DOE (dyspnea on exertion)   2. Coronary artery disease involving native coronary artery of native heart without angina pectoris   3. Chronic obstructive pulmonary disease, unspecified COPD type ([place])   4. Former tobacco use   5. PAD (peripheral artery disease) ([place])   6. Adenocarcinoma  of right lung, stage 3 ([facility])      PLAN:  In order of problems listed above:  1. Dyspnea on exertion: Plan to repeat echocardiogram.  He has been noticing worsening shortness of breath despite quit smoking.  2. CAD: Denies any chest pain.  Continue aspirin and Plavix.  3. COPD: No recent exacerbation  4. History of lung cancer: Status post chemotherapy.  5. Former tobacco use: quit earlier this year.  6. PAD: Prior history of left iliac artery stent in September 2016.   Medication Adjustments/Labs and Tests Ordered: Current medicines are reviewed at length with the patient today.  Concerns regarding medicines are outlined above.  Medication changes, Labs and Tests ordered today are listed in the Patient Instructions below. Patient Instructions  Medication Instructions:  Your physician recommends that you continue on your current medications as directed. Please refer to the Current Medication list given to you today.  If you need a refill on your cardiac medications before your next appointment, please call your pharmacy.   Lab work: NONE ordered at this time of appointment   If you have labs (blood work) drawn today and your tests are completely normal, you will receive your results only by: Marland Kitchen MyChart Message (if you have MyChart) OR . A paper copy in the mail If you have any lab test that is abnormal or we need to change your treatment, we will call you to review the results.  Testing/Procedures: Your physician has requested that you have an echocardiogram. Echocardiography is a painless test that uses sound waves to create images of your heart. It provides your doctor with information about the size and shape of your heart and how well your heart's chambers and valves are working. This procedure takes approximately one hour. There are no restrictions for this procedure. This will be done at [address]   Please schedule prior to 05/11/19 appointment with Dr.  Gwenlyn Found  Follow-Up: At [facility], you and your health needs are our priority.  As part of our continuing mission to provide you with exceptional heart care, we have created designated Provider Care Teams.  These Care Teams include your primary Cardiologist (physician) and Advanced Practice Providers (APPs -  Physician Assistants and Nurse Practitioners) who all work together to provide you with the care you need, when you need it. . Your physician recommends that you schedule a follow-up As scheduled with Dr. Gwenlyn Found  Any Other Special Instructions Will Be Listed Below (If Applicable).       Hilbert Corrigan, [place]  04/29/2019 10:56 PM    [facility] [address], [place], [place]  [address] Phone: [phone]; Fax: [phone]

## 2019-04-28 NOTE — Patient Instructions (Signed)
Medication Instructions:  Your physician recommends that you continue on your current medications as directed. Please refer to the Current Medication list given to you today.  If you need a refill on your cardiac medications before your next appointment, please call your pharmacy.   Lab work: NONE ordered at this time of appointment   If you have labs (blood work) drawn today and your tests are completely normal, you will receive your results only by: Marland Kitchen MyChart Message (if you have MyChart) OR . A paper copy in the mail If you have any lab test that is abnormal or we need to change your treatment, we will call you to review the results.  Testing/Procedures: Your physician has requested that you have an echocardiogram. Echocardiography is a painless test that uses sound waves to create images of your heart. It provides your doctor with information about the size and shape of your heart and how well your heart's chambers and valves are working. This procedure takes approximately one hour. There are no restrictions for this procedure. This will be done at [address]   Please schedule prior to 05/11/19 appointment with Dr. Gwenlyn Found  Follow-Up: At [facility], you and your health needs are our priority.  As part of our continuing mission to provide you with exceptional heart care, we have created designated Provider Care Teams.  These Care Teams include your primary Cardiologist (physician) and Advanced Practice Providers (APPs -  Physician Assistants and Nurse Practitioners) who all work together to provide you with the care you need, when you need it. . Your physician recommends that you schedule a follow-up As scheduled with Dr. Gwenlyn Found  Any Other Special Instructions Will Be Listed Below (If Applicable).

## 2019-05-02 ENCOUNTER — Other Ambulatory Visit: Payer: Self-pay | Admitting: Internal Medicine

## 2019-05-04 ENCOUNTER — Other Ambulatory Visit: Payer: Self-pay

## 2019-05-04 MED ORDER — ANORO ELLIPTA 62.5-25 MCG/INH IN AEPB: 1.0000 | INHALATION_SPRAY | Freq: Every day | RESPIRATORY_TRACT | 6 refills | Status: DC

## 2019-05-05 ENCOUNTER — Ambulatory Visit (HOSPITAL_COMMUNITY): Payer: BC Managed Care – PPO | Attending: Cardiovascular Disease

## 2019-05-05 ENCOUNTER — Other Ambulatory Visit: Payer: Self-pay

## 2019-05-05 DIAGNOSIS — R0600 Dyspnea, unspecified: Secondary | ICD-10-CM | POA: Diagnosis not present

## 2019-05-10 ENCOUNTER — Telehealth: Payer: Self-pay

## 2019-05-10 NOTE — Telephone Encounter (Addendum)
Left a detailed message for the patient of the comments on his voicemail per DPR on file. Also stated that if he has questions to give our office a call and someone will be glad to assist him.  ----- Message from Almyra Deforest, [place] sent at 05/10/2019  9:18 AM EDT ----- Normal pumping function, no significant valve issue

## 2019-05-10 NOTE — Progress Notes (Signed)
Left a detailed message for the patient of the comments on his voicemail per DPR on file. Also stated that if he has questions to give our office a call and someone will be glad to assist him.

## 2019-05-11 ENCOUNTER — Encounter: Payer: Self-pay | Admitting: Cardiovascular Disease

## 2019-05-11 ENCOUNTER — Other Ambulatory Visit: Payer: Self-pay

## 2019-05-11 ENCOUNTER — Ambulatory Visit (INDEPENDENT_AMBULATORY_CARE_PROVIDER_SITE_OTHER): Payer: BC Managed Care – PPO | Admitting: Cardiovascular Disease

## 2019-05-11 VITALS — BP 147/87 | HR 60 | Ht 68.0 in | Wt 146.2 lb

## 2019-05-11 DIAGNOSIS — E785 Hyperlipidemia, unspecified: Secondary | ICD-10-CM

## 2019-05-11 DIAGNOSIS — I10 Essential (primary) hypertension: Secondary | ICD-10-CM

## 2019-05-11 DIAGNOSIS — I739 Peripheral vascular disease, unspecified: Secondary | ICD-10-CM | POA: Diagnosis not present

## 2019-05-11 DIAGNOSIS — R0602 Shortness of breath: Secondary | ICD-10-CM

## 2019-05-11 DIAGNOSIS — I2510 Atherosclerotic heart disease of native coronary artery without angina pectoris: Secondary | ICD-10-CM | POA: Diagnosis not present

## 2019-05-11 NOTE — Patient Instructions (Signed)
Medication Instructions:  Your physician recommends that you continue on your current medications as directed. Please refer to the Current Medication list given to you today.  If you need a refill on your cardiac medications before your next appointment, please call your pharmacy.   Lab work: Your physician recommends that you return for lab work within 1 week: [place]  If you have labs (blood work) drawn today and your tests are completely normal, you will receive your results only by: Marland Kitchen MyChart Message (if you have MyChart) OR . A paper copy in the mail If you have any lab test that is abnormal or we need to change your treatment, we will call you to review the results.  Testing/Procedures: Your physician has requested that you have a lexiscan myoview. For further information please visit [email or web address].   The test will take approximately 3 to 4 hours to complete; you may bring reading material.  If someone comes with you to your appointment, they will need to remain in the main lobby due to limited space in the testing area How to prepare for your Myocardial Perfusion Test: . Do not eat or drink 3 hours prior to your test, except you may have water. . Do not consume products containing caffeine (regular or decaffeinated) 12 hours prior to your test. (ex: coffee, chocolate, sodas, tea). . Do wear comfortable clothes (no dresses or overalls) and walking shoes, tennis shoes preferred (No heels or open toe shoes are allowed). . Do NOT wear cologne, perfume, aftershave, or lotions (deodorant is allowed). . If these instructions are not followed, your test will have to be rescheduled. If you cannot keep your appointment, please provide 24 hours notification to the Nuclear Lab, to avoid a possible $50 charge to your account.    Follow-Up: At [facility], you and your health needs are our priority.  As part of our continuing mission to provide you with  exceptional heart care, we have created designated Provider Care Teams.  These Care Teams include your primary Cardiologist (physician) and Advanced Practice Providers (APPs -  Physician Assistants and Nurse Practitioners) who all work together to provide you with the care you need, when you need it. You will need a follow up appointment in 3 months with HAO MENG, PA-C and in 12 months with Dr. Quay Burow.  Please call our office 2 months in advance to schedule each appointment.

## 2019-05-11 NOTE — Assessment & Plan Note (Signed)
History of peripheral arterial disease status post stenting of his right iliac artery by myself back in 2007 with reintervention with a covered stent 04/13/2015.  Most recent Dopplers performed in January this year revealed this to be widely patent.

## 2019-05-11 NOTE — Assessment & Plan Note (Signed)
History of CAD status post remote RCA stenting using a bare-metal stent with in-stent restenosis and redilatation approximate 4 years later.  He has complained of occasional chest pain recently but more worrisome is fairly recent onset of shortness of breath.  He did have coronary calcification on recent chest CT performed 02/16/2019.  I am going to get a pharmacologic Myoview stress test to further evaluate.

## 2019-05-11 NOTE — Progress Notes (Signed)
05/11/2019 Sean Rose   September 20, 1955  [id number]  Primary Physician [name], NP Primary Cardiologist: Lorretta Harp MD FACP, [place], [place], [place]  HPI:  Sean Rose is a 63 y.o.  thin appearing married Caucasian male father of 2 who I last saw in the office  07/23/2018.Marland Kitchen He has a history of RCA stenting in the past with bare metal stents. He has had in-stent restenosis with re-dilatation approximately 4 years ago. He has also had remote right common iliac artery PTA and stenting back in 2007. His other problems include hypertension, hyperlipidemia and continued tobacco abuse at1-1/2 packs per day despite counseling to the contrary. He denies chest pain , shortness of breath .He was complaining of some claudication back in September and we ultimately proceeded with angiography 04/13/15 revealing a widely patent right iliac stent with 75% ostial left common iliac artery stenosis with a 40 mm gradient. I ultimately stented this with an 8 mm x 38 mm long ICastcovered stent with excellent angiographic clinical and ultrasonographic result. He no longer has claudication.His most recent Dopplers performed 08/16/16 revealed normal ABIs by bilaterally with widely patent stents. Since I saw him a year ago he was diagnosed with adenocarcinoma of the lung stage III and went underwent right upper lobectomy by [name].  He did have adjuvant chemotherapy and radiation therapy followed by Dr. Earlie Server.  He stopped smoking 09/01/2018.  Over the last month or so he is noticed increasing shortness of breath and some occasional chest tightness which she says is similar to his symptoms prior to his stent in his heart.  He does have coronary calcification on recent chest CT.  I am going to get a pharmacologic Myoview stress test to further evaluate.  He denies claudication.   Current Meds  Medication Sig  . amLODipine (NORVASC) 10 MG tablet Take 1 tablet (10 mg total) by mouth daily.  Marland Kitchen  aspirin EC 81 MG tablet Take 81 mg by mouth daily.  . clopidogrel (PLAVIX) 75 MG tablet TAKE 1 TABLET BY MOUTH EVERY DAY  . dexamethasone (DECADRON) 4 MG tablet One tab po bid the day before, day of and day after chemo every 3 weeks.  . diphenhydrAMINE (BENADRYL) 25 mg capsule Take 25 mg by mouth daily as needed for allergies.   . folic acid (FOLVITE) 1 MG tablet Take 1 tablet (1 mg total) by mouth daily.  Marland Kitchen ibuprofen (ADVIL,MOTRIN) 200 MG tablet Take 400 mg by mouth every 6 (six) hours as needed for headache or moderate pain.   Marland Kitchen MAGNESIUM-OXIDE 400 (241.3 Mg) MG tablet TAKE 1 TABLET BY MOUTH THREE TIMES DAILY  . metoprolol succinate (TOPROL-XL) 100 MG 24 hr tablet TAKE 1 TABLET BY MOUTH EVERY DAY  . Multiple Vitamin (MULTIVITAMIN WITH MINERALS) TABS tablet Take 1 tablet by mouth daily.  . nitroGLYCERIN (NITROSTAT) 0.4 MG SL tablet PLACE 1 TABLET UNDER THE TONGUE EVERY 5 MINUTES AS NEEDED FOR CHEST PAIN  . umeclidinium-vilanterol (ANORO ELLIPTA) 62.5-25 MCG/INH AEPB Inhale 1 puff into the lungs daily.     Allergies  Allergen Reactions  . Tyloxapol Nausea Only    Social History   Socioeconomic History  . Marital status: Married    Spouse name: Not on file  . Number of children: Not on file  . Years of education: Not on file  . Highest education level: Not on file  Occupational History  . Not on file  Social Needs  . Financial resource strain: Not on  file  . Food insecurity    Worry: Not on file    Inability: Not on file  . Transportation needs    Medical: Not on file    Non-medical: Not on file  Tobacco Use  . Smoking status: Former Smoker    Packs/day: 0.25    Years: 38.00    Pack years: 9.50    Types: Cigarettes    Quit date: 10/19/2018    Years since quitting: 0.5  . Smokeless tobacco: Never Used  . Tobacco comment: pack per day 12.17.19  Substance and Sexual Activity  . Alcohol use: Yes    Alcohol/week: 36.0 standard drinks    Types: 36 Cans of beer per week     Comment: 10/30/2016 "I usually drink 4-8 beers qd"  . Drug use: No  . Sexual activity: Yes  Lifestyle  . Physical activity    Days per week: Not on file    Minutes per session: Not on file  . Stress: Not on file  Relationships  . Social Herbalist on phone: Not on file    Gets together: Not on file    Attends religious service: Not on file    Active member of club or organization: Not on file    Attends meetings of clubs or organizations: Not on file    Relationship status: Not on file  . Intimate partner violence    Fear of current or ex partner: Not on file    Emotionally abused: Not on file    Physically abused: Not on file    Forced sexual activity: Not on file  Other Topics Concern  . Not on file  Social History Narrative  . Not on file     Review of Systems: General: negative for chills, fever, night sweats or weight changes.  Cardiovascular: negative for chest pain, dyspnea on exertion, edema, orthopnea, palpitations, paroxysmal nocturnal dyspnea or shortness of breath Dermatological: negative for rash Respiratory: negative for cough or wheezing Urologic: negative for hematuria Abdominal: negative for nausea, vomiting, diarrhea, bright red blood per rectum, melena, or hematemesis Neurologic: negative for visual changes, syncope, or dizziness All other systems reviewed and are otherwise negative except as noted above.    Blood pressure (!) 147/87, pulse 60, height 5\' 8"  (1.727 m), weight 146 lb 3.2 oz (66.3 kg), SpO2 98 %.  General appearance: alert and no distress Neck: no adenopathy, no carotid bruit, no JVD, supple, symmetrical, trachea midline and thyroid not enlarged, symmetric, no tenderness/mass/nodules Lungs: clear to auscultation bilaterally Heart: regular rate and rhythm, S1, S2 normal, no murmur, click, rub or gallop Extremities: extremities normal, atraumatic, no cyanosis or edema Pulses: 2+ and symmetric Skin: Skin color, texture, turgor  normal. No rashes or lesions Neurologic: Alert and oriented X 3, normal strength and tone. Normal symmetric reflexes. Normal coordination and gait  EKG not performed today  ASSESSMENT AND PLAN:   PVD (peripheral vascular disease) History of peripheral arterial disease status post stenting of his right iliac artery by myself back in 2007 with reintervention with a covered stent 04/13/2015.  Most recent Dopplers performed in January this year revealed this to be widely patent.  Coronary atherosclerosis of native coronary artery History of CAD status post remote RCA stenting using a bare-metal stent with in-stent restenosis and redilatation approximate 4 years later.  He has complained of occasional chest pain recently but more worrisome is fairly recent onset of shortness of breath.  He did have coronary calcification on  recent chest CT performed 02/16/2019.  I am going to get a pharmacologic Myoview stress test to further evaluate.  Essential hypertension History of essential hypertension with blood pressure measured today 147/87.  He is on amlodipine and metoprolol.  Hyperlipidemia History of hyperlipidemia not on statin therapy with lipid profile performed 2 years ago revealing LDL of 73.  I am going to recheck a lipid liver profile.      Lorretta Harp MD FACP,FACC,FAHA, [facility] 05/11/2019 10:49 AM

## 2019-05-11 NOTE — Assessment & Plan Note (Signed)
History of essential hypertension with blood pressure measured today 147/87.  He is on amlodipine and metoprolol.

## 2019-05-11 NOTE — Assessment & Plan Note (Signed)
History of hyperlipidemia not on statin therapy with lipid profile performed 2 years ago revealing LDL of 73.  I am going to recheck a lipid liver profile.

## 2019-05-12 ENCOUNTER — Telehealth (HOSPITAL_COMMUNITY): Payer: Self-pay

## 2019-05-12 NOTE — Telephone Encounter (Signed)
Encounter complete. 

## 2019-05-14 ENCOUNTER — Other Ambulatory Visit: Payer: Self-pay

## 2019-05-14 ENCOUNTER — Ambulatory Visit (HOSPITAL_COMMUNITY)
Admission: RE | Admit: 2019-05-14 | Discharge: 2019-05-14 | Disposition: A | Discharge status: Home / Self Care | Payer: BC Managed Care – PPO | Source: Ambulatory Visit | Attending: Cardiovascular Disease | Admitting: Cardiovascular Disease

## 2019-05-14 DIAGNOSIS — R0602 Shortness of breath: Secondary | ICD-10-CM | POA: Diagnosis not present

## 2019-05-14 LAB — MYOCARDIAL PERFUSION IMAGING
LV dias vol: 37 mL (ref 62–150)
LV sys vol: 87 mL
Peak HR: 78 {beats}/min
Rest HR: 63 {beats}/min
SDS: 0
SRS: 5
SSS: 5
TID: 0.98

## 2019-05-14 MED ORDER — TECHNETIUM TC 99M TETROFOSMIN IV KIT
9.8000 | PACK | Freq: Once | INTRAVENOUS | Status: AC | PRN
  Administered 2019-05-14: 9.8 via INTRAVENOUS
  Filled 2019-05-14: qty 10

## 2019-05-14 MED ORDER — REGADENOSON 0.4 MG/5ML IV SOLN
0.4000 mg | Freq: Once | INTRAVENOUS | Status: AC
  Administered 2019-05-14: 0.4 mg via INTRAVENOUS

## 2019-05-14 MED ORDER — TECHNETIUM TC 99M TETROFOSMIN IV KIT
28.0000 | PACK | Freq: Once | INTRAVENOUS | Status: AC | PRN
  Administered 2019-05-14: 28 via INTRAVENOUS
  Filled 2019-05-14: qty 28

## 2019-05-28 ENCOUNTER — Other Ambulatory Visit: Payer: Self-pay

## 2019-05-28 DIAGNOSIS — Z20822 Contact with and (suspected) exposure to covid-19: Secondary | ICD-10-CM

## 2019-05-29 LAB — NOVEL CORONAVIRUS, NAA: SARS-CoV-2, NAA: NOT DETECTED

## 2019-06-04 ENCOUNTER — Ambulatory Visit (HOSPITAL_COMMUNITY)
Admission: EM | Admit: 2019-06-04 | Discharge: 2019-06-04 | Disposition: A | Discharge status: Home / Self Care | Payer: BC Managed Care – PPO | Attending: Family Medicine | Admitting: Family Medicine

## 2019-06-04 ENCOUNTER — Encounter (HOSPITAL_COMMUNITY): Payer: Self-pay

## 2019-06-04 ENCOUNTER — Other Ambulatory Visit: Payer: Self-pay

## 2019-06-04 DIAGNOSIS — J0110 Acute frontal sinusitis, unspecified: Secondary | ICD-10-CM

## 2019-06-04 MED ORDER — AMOXICILLIN 500 MG PO CAPS: 500.0000 mg | ORAL_CAPSULE | Freq: Three times a day (TID) | ORAL | 0 refills | Status: DC

## 2019-06-04 MED ORDER — ACETAMINOPHEN 325 MG PO TABS
650.0000 mg | ORAL_TABLET | Freq: Once | ORAL | Status: AC
  Administered 2019-06-04: 650 mg via ORAL

## 2019-06-04 MED ORDER — ACETAMINOPHEN 325 MG PO TABS
ORAL_TABLET | ORAL | Status: AC
  Filled 2019-06-04: qty 2

## 2019-06-04 NOTE — Discharge Instructions (Signed)
Take the amoxicillin as prescribed.  Take Tylenol as needed for discomfort.    Follow-up with your primary care provider or return here if your symptoms are not improving or get worse.

## 2019-06-04 NOTE — ED Provider Notes (Signed)
[place]    CSN: [id number] Arrival date & time: 06/04/19  [id number]      History   Chief Complaint Chief Complaint  Patient presents with  . Fever  . Headache    HPI Sean Rose is a 63 y.o. male.   Patient presents with a 1 to 2-week history of intermittent fever, chills, frontal headache, sinus pressure and congestion.  T-max 101 at home but states generally his fevers have been between 99 and 100.  He has taken ibuprofen and Benadryl with temporary relief.  He denies sore throat, cough, shortness of breath, vomiting, diarrhea, rash, or other symptoms.  His medical history is significant for lung CA, hypertension, CAD.  The history is provided by the patient.    Past Medical History:  Diagnosis Date  . Anginal pain ([place])   . CAD (coronary artery disease)   . CAP (community acquired pneumonia) 09/2016  . COPD (chronic obstructive pulmonary disease) ([place])   . GERD (gastroesophageal reflux disease)   . Heart murmur    "I was told I had one when I was a kid"  . Hemorrhoids   . History of anal fissures    "no surgeries" (10/30/2016)  . Hyperlipidemia   . Hypertension   . Peripheral arterial disease ([place])    status post right common iliac artery stenting back in 2007  . Seasonal allergies   . Tobacco abuse     Patient Active Problem List   Diagnosis Date Noted  . Abdominal pain 12/07/2018  . Appendicitis 12/07/2018  . Adenocarcinoma of right lung, stage 3 ([place]) 10/13/2018  . Goals of care, counseling/discussion 10/13/2018  . Encounter for antineoplastic chemotherapy 10/13/2018  . S/P Right VATS with Right Upper Lobectomy, Lymph Node Sampling, Intercostal Nerve Block 09/14/2018  . Pneumothorax after biopsy 09/02/2018  . Stage 1 mild COPD by GOLD classification ([place]) 07/30/2018  . Syncope 07/23/2018  . Antiplatelet or antithrombotic long-term use 07/14/2018  . Nodule of upper lobe of right lung 07/14/2018  . Multiple lung nodules on CT 07/14/2018  .  Nausea vomiting and diarrhea 10/30/2016  . Hyponatremia 10/30/2016  . Hypocalcemia 10/30/2016  . Hypokalemia 04/14/2015  . Hypomagnesemia 04/14/2015  . Claudication ([place]) 04/13/2015  . Essential hypertension 01/14/2014  . Hyperlipidemia 01/14/2014  . Thrombosed external hemorrhoids 12/07/2013  . Chronic anticoagulation 01/20/2013  . PVD (peripheral vascular disease) ([place]) 01/20/2013  . Coronary atherosclerosis of native coronary artery 01/20/2013  . GERD (gastroesophageal reflux disease) 01/20/2013  . Benign esophageal stricture 01/20/2013  . DENTAL CARIES 07/20/2010    Past Surgical History:  Procedure Laterality Date  . ANKLE SURGERY Left    "rebuilt it"  . ANTERIOR CRUCIATE LIGAMENT REPAIR Right   . CARDIAC CATHETERIZATION  09/23/2008   Continued medical therapy - may need GI evaluation in addition.  Marland Kitchen CARDIAC CATHETERIZATION  10/28/2007   Medical therapy recommended.  Marland Kitchen CARDIAC CATHETERIZATION  11/18/2006   In-stent restenosis RCA  (50% distal edge, 80% segmental mid, and 50-60% segmental proximal). Successful cutting balloon atherectomy using a [id number] cutting balloon. 3 inflations with atherectomy performed on mid and proximal portions resulting in reduction of 80% mid in-stent restenosis to less than 20% residual and 50-60% segmental proximal to less than 20% residual without dissection.  Marland Kitchen CARDIAC CATHETERIZATION  02/26/2006   Severe stenosis in RCA. Stenting performed using IVUS. 3.5x20 Maverick balloon deployed at [employer]. Distal stent-a 4x28 Liberte stent-deployed 12atm 48sec, 12atm 31sec, 4atm 19sec. Mid stent-a 4x28 Liberte stent-deployed 14atm 45sec,  14atm 60sec, 14atm 44sec. Proximal stent-4x8 Liberte- 14atm 45sec,14atm 47sec, 16atm 43sec. Severely diseased segment then appeared TIMI-3 flow.  Marland Kitchen CARDIOVASCULAR STRESS TEST  11/17/2012   No significant ECG changes. Septal perfusion defect is new when complared to study from 2010. Abnormal myocardial perfusion imaging with a  basal to mid perfusion suggestive of previous MI.  Marland Kitchen CAROTID DOPPLER  08/09/2011   Bilateral Bulb/Proximal ICA - demonstrated a mild amount of fibrous plaque without evidence of significant diameter reduction reduction or other vascular abnormality.  . CHEST TUBE INSERTION Right 09/02/2018   Procedure: Chest Tube Insertion;  Surgeon: Garner Nash, DO;  Location: [facility];  Service: Thoracic;  Laterality: Right;  . COLONOSCOPY     2003, 2014  . CORONARY ANGIOPLASTY WITH STENT PLACEMENT    . FEMORAL ARTERY STENT    . INGUINAL HERNIA REPAIR Right   . KNEE ARTHROSCOPY Right "multiple"  . LAPAROSCOPIC APPENDECTOMY N/A 12/07/2018   Procedure: APPENDECTOMY LAPAROSCOPIC;  Surgeon: Coralie Keens, MD;  Location: [facility];  Service: General;  Laterality: N/A;  . LOWER EXTREMITY ARTERIAL DOPPLER  01/31/2011   Bilateral ABIs-normal values with no suggestion of arterial insuff to the lower extremities at rest. Right CIA stent-mild amount of nonhemodynamically significant plaque is noted throughout  . 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 PERCUTANEOUS STENT INTERVENTION  04/04/2006 & 04/13/2015   a. Right common iliac artery with an 8.0x18 mm Herculink stent deployed at 12 atm. Stenosis was reduced from 80% to 0% with brisk flow. b. I-cast stenting to left common iliac artery  . PERIPHERAL VASCULAR CATHETERIZATION N/A 04/13/2015   Procedure: Lower Extremity Angiography;  Surgeon: Lorretta Harp, MD; L-oCIA 75%, 40-50% L-EIA, R-CIA stent patent, s/p 8 mm x 38 mm ICast covered stent>>0% stenosis in L-oCIA     . SHOULDER ARTHROSCOPY WITH ROTATOR CUFF REPAIR Right   . TRANSTHORACIC ECHOCARDIOGRAM  11/26/2012   EF not noted. Aortic valve-sclerosis without stenosis, no regurgiation.   Marland Kitchen UPPER GASTROINTESTINAL ENDOSCOPY    . US CAROTID DOPPLER BILATERAL ([place] [facility])  08/09/2011   Bilateral Bulb/Proximal ICAa demonstrated a mild amount of fibrous plaque without evidence of significant diameter reduction or any  other vascular abnormality.  Marland Kitchen VIDEO ASSISTED THORACOSCOPY (VATS)/ LOBECTOMY Right 09/14/2018   Procedure: RIGHT VIDEO ASSISTED THORACOSCOPY (VATS)/ RIGHT UPPER LOBECTOMY;  Surgeon: Melrose Nakayama, MD;  Location: [facility];  Service: Thoracic;  Laterality: Right;  Marland Kitchen VIDEO BRONCHOSCOPY WITH ENDOBRONCHIAL NAVIGATION N/A 09/02/2018   Procedure: VIDEO BRONCHOSCOPY WITH ENDOBRONCHIAL NAVIGATION;  Surgeon: Garner Nash, DO;  Location: [facility];  Service: Thoracic;  Laterality: N/A;       Home Medications    Prior to Admission medications   Medication Sig Start Date End Date Taking? Authorizing Provider  amLODipine (NORVASC) 10 MG tablet Take 1 tablet (10 mg total) by mouth daily. 03/30/19   Almyra Deforest, PA  amoxicillin (AMOXIL) 500 MG capsule Take 1 capsule (500 mg total) by mouth 3 (three) times daily. 06/04/19   Sharion Balloon, NP  aspirin EC 81 MG tablet Take 81 mg by mouth daily.    [provider]  clopidogrel (PLAVIX) 75 MG tablet TAKE 1 TABLET BY MOUTH EVERY DAY 04/02/19   Lorretta Harp, MD  dexamethasone (DECADRON) 4 MG tablet One tab po bid the day before, day of and day after chemo every 3 weeks. 10/13/18   [name], MD  diphenhydrAMINE (BENADRYL) 25 mg capsule Take 25 mg by mouth daily  as needed for allergies.     [provider]  folic acid (FOLVITE) 1 MG tablet Take 1 tablet (1 mg total) by mouth daily. 10/13/18   [name], MD  ibuprofen (ADVIL,MOTRIN) 200 MG tablet Take 400 mg by mouth every 6 (six) hours as needed for headache or moderate pain.     [provider]  MAGNESIUM-OXIDE 400 (241.3 Mg) MG tablet TAKE 1 TABLET BY MOUTH THREE TIMES DAILY 03/01/19   [name], MD  metoprolol succinate (TOPROL-XL) 100 MG 24 hr tablet TAKE 1 TABLET BY MOUTH EVERY DAY 02/02/19   Lorretta Harp, MD  Multiple Vitamin (MULTIVITAMIN WITH MINERALS) TABS tablet Take 1 tablet by mouth daily.    [provider]  nitroGLYCERIN (NITROSTAT) 0.4 MG SL  tablet PLACE 1 TABLET UNDER THE TONGUE EVERY 5 MINUTES AS NEEDED FOR CHEST PAIN 04/09/19   Lorretta Harp, MD  umeclidinium-vilanterol (ANORO ELLIPTA) 62.5-25 MCG/INH AEPB Inhale 1 puff into the lungs daily. 05/04/19   Garner Nash, DO    Family History Family History  Problem Relation Age of Onset  . Colon cancer Mother   . Heart disease Father   . Heart disease Paternal Grandfather     Social History Social History   Tobacco Use  . Smoking status: Former Smoker    Packs/day: 0.25    Years: 38.00    Pack years: 9.50    Types: Cigarettes    Quit date: 10/19/2018    Years since quitting: 0.6  . Smokeless tobacco: Never Used  . Tobacco comment: pack per day 12.17.19  Substance Use Topics  . Alcohol use: Yes    Comment: socially  . Drug use: No     Allergies   Tyloxapol   Review of Systems Review of Systems  Constitutional: Positive for chills and fever.  HENT: Positive for congestion and sinus pressure. Negative for ear pain and sore throat.   Eyes: Negative for pain and visual disturbance.  Respiratory: Negative for cough and shortness of breath.   Cardiovascular: Negative for chest pain and palpitations.  Gastrointestinal: Negative for abdominal pain and vomiting.  Genitourinary: Negative for dysuria and hematuria.  Musculoskeletal: Negative for arthralgias and back pain.  Skin: Negative for color change and rash.  Neurological: Positive for headaches. Negative for seizures and syncope.  All other systems reviewed and are negative.    Physical Exam Triage Vital Signs ED Triage Vitals  Enc Vitals Group     BP 06/04/19 1022 (!) 132/91     Pulse Rate 06/04/19 1022 73     Resp 06/04/19 1022 16     Temp 06/04/19 1022 100 F (37.8 C)     Temp Source 06/04/19 1022 Oral     SpO2 06/04/19 1022 100 %     Weight --      Height --      Head Circumference --      Peak Flow --      Pain Score 06/04/19 1020 0     Pain Loc --      Pain Edu? --      Excl. in  [place]? --    No data found.  Updated Vital Signs BP (!) 132/91 (BP Location: Left Arm)   Pulse 73   Temp 100 F (37.8 C) (Oral)   Resp 16   SpO2 100%   Visual Acuity Right Eye Distance:   Left Eye Distance:   Bilateral Distance:    Right Eye Near:  Left Eye Near:    Bilateral Near:     Physical Exam Vitals signs and nursing note reviewed.  Constitutional:      General: He is not in acute distress.    Appearance: He is well-developed. He is not ill-appearing.  HENT:     Head: Normocephalic and atraumatic.     Right Ear: Tympanic membrane normal.     Left Ear: Tympanic membrane normal.     Nose: Congestion present.     Mouth/Throat:     Mouth: Mucous membranes are moist.     Pharynx: Oropharynx is clear.  Eyes:     Conjunctiva/sclera: Conjunctivae normal.  Neck:     Musculoskeletal: Neck supple.  Cardiovascular:     Rate and Rhythm: Normal rate and regular rhythm.     Heart sounds: No murmur.  Pulmonary:     Effort: Pulmonary effort is normal. No respiratory distress.     Breath sounds: Normal breath sounds.  Abdominal:     General: Bowel sounds are normal.     Palpations: Abdomen is soft.     Tenderness: There is no abdominal tenderness. There is no guarding or rebound.  Skin:    General: Skin is warm and dry.     Findings: No rash.  Neurological:     General: No focal deficit present.     Mental Status: He is alert and oriented to person, place, and time.      UC Treatments / Results  Labs (all labs ordered are listed, but only abnormal results are displayed) Labs Reviewed - No data to display  EKG   Radiology No results found.  Procedures Procedures (including critical care time)  Medications Ordered in UC Medications  acetaminophen (TYLENOL) tablet 650 mg (650 mg Oral Given 06/04/19 1027)  acetaminophen (TYLENOL) 325 MG tablet (has no administration in time range)    Initial Impression / Assessment and Plan / UC Course  I have reviewed the  triage vital signs and the nursing notes.  Pertinent labs & imaging results that were available during my care of the patient were reviewed by me and considered in my medical decision making (see chart for details).    Acute sinusitis.  Treating with amoxicillin.  Instructed patient to take Tylenol as needed for discomfort.  Instructed patient to follow-up with his PCP on Monday.  Instructed him to return here if he is unable to get an appointment with his PCP and his symptoms are not improving.  Patient agrees to plan of care.     Final Clinical Impressions(s) / UC Diagnoses   Final diagnoses:  Acute non-recurrent frontal sinusitis     Discharge Instructions     Take the amoxicillin as prescribed.  Take Tylenol as needed for discomfort.    Follow-up with your primary care provider or return here if your symptoms are not improving or get worse.    ED Prescriptions    Medication Sig Dispense Auth. Provider   amoxicillin (AMOXIL) 500 MG capsule Take 1 capsule (500 mg total) by mouth 3 (three) times daily. 21 capsule Sharion Balloon, NP     PDMP not reviewed this encounter.   Sharion Balloon, NP 06/04/19 1051

## 2019-06-04 NOTE — ED Triage Notes (Signed)
Patient presents to Urgent Care with complaints of fever, headache, and chills since about a week ago. Patient reports his temp at home has been running about 100.5, has been taking benadryl thinking he has allergies, advil for the headaches. Had COVID test last week and it was negative, pt thinks he has a sinus infection.

## 2019-06-08 ENCOUNTER — Other Ambulatory Visit: Payer: Self-pay

## 2019-06-08 ENCOUNTER — Ambulatory Visit (HOSPITAL_COMMUNITY)
Admission: EM | Admit: 2019-06-08 | Discharge: 2019-06-08 | Disposition: A | Discharge status: Home / Self Care | Payer: BC Managed Care – PPO | Attending: Internal Medicine | Admitting: Internal Medicine

## 2019-06-08 ENCOUNTER — Ambulatory Visit (INDEPENDENT_AMBULATORY_CARE_PROVIDER_SITE_OTHER): Payer: BC Managed Care – PPO

## 2019-06-08 ENCOUNTER — Encounter (HOSPITAL_COMMUNITY): Payer: Self-pay | Admitting: Emergency Medicine

## 2019-06-08 DIAGNOSIS — R509 Fever, unspecified: Secondary | ICD-10-CM | POA: Diagnosis not present

## 2019-06-08 DIAGNOSIS — Z87891 Personal history of nicotine dependence: Secondary | ICD-10-CM | POA: Diagnosis not present

## 2019-06-08 DIAGNOSIS — Z7982 Long term (current) use of aspirin: Secondary | ICD-10-CM | POA: Diagnosis not present

## 2019-06-08 DIAGNOSIS — Z7901 Long term (current) use of anticoagulants: Secondary | ICD-10-CM | POA: Diagnosis not present

## 2019-06-08 DIAGNOSIS — Z20828 Contact with and (suspected) exposure to other viral communicable diseases: Secondary | ICD-10-CM | POA: Diagnosis not present

## 2019-06-08 DIAGNOSIS — R0602 Shortness of breath: Secondary | ICD-10-CM | POA: Diagnosis not present

## 2019-06-08 DIAGNOSIS — R011 Cardiac murmur, unspecified: Secondary | ICD-10-CM | POA: Diagnosis not present

## 2019-06-08 DIAGNOSIS — I2510 Atherosclerotic heart disease of native coronary artery without angina pectoris: Secondary | ICD-10-CM | POA: Diagnosis not present

## 2019-06-08 DIAGNOSIS — J449 Chronic obstructive pulmonary disease, unspecified: Secondary | ICD-10-CM | POA: Insufficient documentation

## 2019-06-08 DIAGNOSIS — Z79899 Other long term (current) drug therapy: Secondary | ICD-10-CM | POA: Diagnosis not present

## 2019-06-08 DIAGNOSIS — E785 Hyperlipidemia, unspecified: Secondary | ICD-10-CM | POA: Insufficient documentation

## 2019-06-08 DIAGNOSIS — I10 Essential (primary) hypertension: Secondary | ICD-10-CM | POA: Insufficient documentation

## 2019-06-08 LAB — CBC WITH DIFFERENTIAL/PLATELET
Abs Immature Granulocytes: 0.04 10*3/uL (ref 0.00–0.07)
Basophils Absolute: 0.1 10*3/uL (ref 0.0–0.1)
Basophils Relative: 1 %
Eosinophils Absolute: 0.1 10*3/uL (ref 0.0–0.5)
Eosinophils Relative: 1 %
HCT: 42.9 % (ref 39.0–52.0)
Hemoglobin: 15.8 g/dL (ref 13.0–17.0)
Immature Granulocytes: 1 %
Lymphocytes Relative: 19 %
Lymphs Abs: 1.7 10*3/uL (ref 0.7–4.0)
MCH: 35.7 pg — ABNORMAL HIGH (ref 26.0–34.0)
MCHC: 36.8 g/dL — ABNORMAL HIGH (ref 30.0–36.0)
MCV: 96.8 fL (ref 80.0–100.0)
Monocytes Absolute: 1.2 10*3/uL — ABNORMAL HIGH (ref 0.1–1.0)
Monocytes Relative: 14 %
Neutro Abs: 5.6 10*3/uL (ref 1.7–7.7)
Neutrophils Relative %: 64 %
Platelets: 418 10*3/uL — ABNORMAL HIGH (ref 150–400)
RBC: 4.43 MIL/uL (ref 4.22–5.81)
RDW: 12.1 % (ref 11.5–15.5)
WBC: 8.8 10*3/uL (ref 4.0–10.5)
nRBC: 0 % (ref 0.0–0.2)

## 2019-06-08 LAB — COMPREHENSIVE METABOLIC PANEL
ALT: 16 U/L (ref 0–44)
AST: 30 U/L (ref 15–41)
Albumin: 3.8 g/dL (ref 3.5–5.0)
Alkaline Phosphatase: 103 U/L (ref 38–126)
Anion gap: 12 (ref 5–15)
BUN: <5 mg/dL — ABNORMAL LOW (ref 8–23)
CO2: 24 mmol/L (ref 22–32)
Calcium: 9.5 mg/dL (ref 8.9–10.3)
Chloride: 91 mmol/L — ABNORMAL LOW (ref 98–111)
Creatinine, Ser: 0.83 mg/dL (ref 0.61–1.24)
GFR calc Af Amer: >60 mL/min (ref >60)
GFR calc non Af Amer: >60 mL/min (ref >60)
Glucose, Bld: 109 mg/dL — ABNORMAL HIGH (ref 70–99)
Potassium: 5.1 mmol/L (ref 3.5–5.1)
Sodium: 127 mmol/L — ABNORMAL LOW (ref 135–145)
Total Bilirubin: 0.7 mg/dL (ref 0.3–1.2)
Total Protein: 7.4 g/dL (ref 6.5–8.1)

## 2019-06-08 NOTE — ED Provider Notes (Signed)
[facility]    CSN: [id number] Arrival date & time: 06/08/19  9735      History   Chief Complaint Chief Complaint  Patient presents with  . Fever  . Appointment    9:50    HPI Sean Rose is a 63 y.o. male.   Patient is a 63 year old male with past medical history of anginal pain, CAD, CAP, COPD, GERD, heart murmur, hemorrhoids, hyperlipidemia, hypertension, PAD, seasonal allergies, lung cancer, lobectomy.  He presents today with persistent fever that has been present for 2 weeks or more.  Initially in the beginning of his symptoms was tested for Covid which was negative.  The fever seems to come worse at night.  He is also had mild headache and shortness of breath but he also has chronic shortness of breath due to COPD and lobectomy.  Was seen here on 06/04/2019 and treated for a sinus infection.  He has been taking the amoxicillin as prescribed without any relief in his fever.  Denies any cough, chest congestion, nasal congestion, runny nose, sore throat, ear pain.  Denies any abdominal pain, back pain, nausea, vomiting, diarrhea, dysuria, hematuria or urinary frequency.  No recent long distance traveling or sick contacts.  Has been quarantining at home.  ROS per HPI      Past Medical History:  Diagnosis Date  . Anginal pain ([place])   . CAD (coronary artery disease)   . CAP (community acquired pneumonia) 09/2016  . COPD (chronic obstructive pulmonary disease) ([place])   . GERD (gastroesophageal reflux disease)   . Heart murmur    "I was told I had one when I was a kid"  . Hemorrhoids   . History of anal fissures    "no surgeries" (10/30/2016)  . Hyperlipidemia   . Hypertension   . Peripheral arterial disease ([place])    status post right common iliac artery stenting back in 2007  . Seasonal allergies   . Tobacco abuse     Patient Active Problem List   Diagnosis Date Noted  . Abdominal pain 12/07/2018  . Appendicitis 12/07/2018  . Adenocarcinoma of right  lung, stage 3 ([place]) 10/13/2018  . Goals of care, counseling/discussion 10/13/2018  . Encounter for antineoplastic chemotherapy 10/13/2018  . S/P Right VATS with Right Upper Lobectomy, Lymph Node Sampling, Intercostal Nerve Block 09/14/2018  . Pneumothorax after biopsy 09/02/2018  . Stage 1 mild COPD by GOLD classification ([place]) 07/30/2018  . Syncope 07/23/2018  . Antiplatelet or antithrombotic long-term use 07/14/2018  . Nodule of upper lobe of right lung 07/14/2018  . Multiple lung nodules on CT 07/14/2018  . Nausea vomiting and diarrhea 10/30/2016  . Hyponatremia 10/30/2016  . Hypocalcemia 10/30/2016  . Hypokalemia 04/14/2015  . Hypomagnesemia 04/14/2015  . Claudication ([place]) 04/13/2015  . Essential hypertension 01/14/2014  . Hyperlipidemia 01/14/2014  . Thrombosed external hemorrhoids 12/07/2013  . Chronic anticoagulation 01/20/2013  . PVD (peripheral vascular disease) ([place]) 01/20/2013  . Coronary atherosclerosis of native coronary artery 01/20/2013  . GERD (gastroesophageal reflux disease) 01/20/2013  . Benign esophageal stricture 01/20/2013  . DENTAL CARIES 07/20/2010    Past Surgical History:  Procedure Laterality Date  . ANKLE SURGERY Left    "rebuilt it"  . ANTERIOR CRUCIATE LIGAMENT REPAIR Right   . CARDIAC CATHETERIZATION  09/23/2008   Continued medical therapy - may need GI evaluation in addition.  Marland Kitchen CARDIAC CATHETERIZATION  10/28/2007   Medical therapy recommended.  Marland Kitchen CARDIAC CATHETERIZATION  11/18/2006   In-stent restenosis RCA  (  50% distal edge, 80% segmental mid, and 50-60% segmental proximal). Successful cutting balloon atherectomy using a [id number] cutting balloon. 3 inflations with atherectomy performed on mid and proximal portions resulting in reduction of 80% mid in-stent restenosis to less than 20% residual and 50-60% segmental proximal to less than 20% residual without dissection.  Marland Kitchen CARDIAC CATHETERIZATION  02/26/2006   Severe stenosis in RCA. Stenting performed  using IVUS. 3.5x20 Maverick balloon deployed at [employer]. Distal stent-a 4x28 Liberte stent-deployed 12atm 48sec, 12atm 31sec, 4atm 19sec. Mid stent-a 4x28 Liberte stent-deployed 14atm 45sec, 14atm 60sec, 14atm 44sec. Proximal stent-4x8 Liberte- 14atm 45sec,14atm 47sec, 16atm 43sec. Severely diseased segment then appeared TIMI-3 flow.  Marland Kitchen CARDIOVASCULAR STRESS TEST  11/17/2012   No significant ECG changes. Septal perfusion defect is new when complared to study from 2010. Abnormal myocardial perfusion imaging with a basal to mid perfusion suggestive of previous MI.  Marland Kitchen CAROTID DOPPLER  08/09/2011   Bilateral Bulb/Proximal ICA - demonstrated a mild amount of fibrous plaque without evidence of significant diameter reduction reduction or other vascular abnormality.  . CHEST TUBE INSERTION Right 09/02/2018   Procedure: Chest Tube Insertion;  Surgeon: Garner Nash, DO;  Location: [facility];  Service: Thoracic;  Laterality: Right;  . COLONOSCOPY     2003, 2014  . CORONARY ANGIOPLASTY WITH STENT PLACEMENT    . FEMORAL ARTERY STENT    . INGUINAL HERNIA REPAIR Right   . KNEE ARTHROSCOPY Right "multiple"  . LAPAROSCOPIC APPENDECTOMY N/A 12/07/2018   Procedure: APPENDECTOMY LAPAROSCOPIC;  Surgeon: Coralie Keens, MD;  Location: [facility];  Service: General;  Laterality: N/A;  . LOWER EXTREMITY ARTERIAL DOPPLER  01/31/2011   Bilateral ABIs-normal values with no suggestion of arterial insuff to the lower extremities at rest. Right CIA stent-mild amount of nonhemodynamically significant plaque is noted throughout  . 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 PERCUTANEOUS STENT INTERVENTION  04/04/2006 & 04/13/2015   a. Right common iliac artery with an 8.0x18 mm Herculink stent deployed at 12 atm. Stenosis was reduced from 80% to 0% with brisk flow. b. I-cast stenting to left common iliac artery  . PERIPHERAL VASCULAR CATHETERIZATION N/A 04/13/2015   Procedure: Lower Extremity Angiography;  Surgeon: Lorretta Harp, MD; L-oCIA 75%, 40-50% L-EIA, R-CIA stent patent, s/p 8 mm x 38 mm ICast covered stent>>0% stenosis in L-oCIA     . SHOULDER ARTHROSCOPY WITH ROTATOR CUFF REPAIR Right   . TRANSTHORACIC ECHOCARDIOGRAM  11/26/2012   EF not noted. Aortic valve-sclerosis without stenosis, no regurgiation.   Marland Kitchen UPPER GASTROINTESTINAL ENDOSCOPY    . US CAROTID DOPPLER BILATERAL ([place] [facility]X)  08/09/2011   Bilateral Bulb/Proximal ICAa demonstrated a mild amount of fibrous plaque without evidence of significant diameter reduction or any other vascular abnormality.  Marland Kitchen VIDEO ASSISTED THORACOSCOPY (VATS)/ LOBECTOMY Right 09/14/2018   Procedure: RIGHT VIDEO ASSISTED THORACOSCOPY (VATS)/ RIGHT UPPER LOBECTOMY;  Surgeon: Melrose Nakayama, MD;  Location: [facility];  Service: Thoracic;  Laterality: Right;  Marland Kitchen VIDEO BRONCHOSCOPY WITH ENDOBRONCHIAL NAVIGATION N/A 09/02/2018   Procedure: VIDEO BRONCHOSCOPY WITH ENDOBRONCHIAL NAVIGATION;  Surgeon: Garner Nash, DO;  Location: [facility];  Service: Thoracic;  Laterality: N/A;       Home Medications    Prior to Admission medications   Medication Sig Start Date End Date Taking? Authorizing Provider  amLODipine (NORVASC) 10 MG tablet Take 1 tablet (10 mg total) by mouth daily. 03/30/19   Almyra Deforest, PA  amoxicillin (AMOXIL) 500 MG capsule Take 1 capsule (  500 mg total) by mouth 3 (three) times daily. 06/04/19   Sharion Balloon, NP  aspirin EC 81 MG tablet Take 81 mg by mouth daily.    [provider]  clopidogrel (PLAVIX) 75 MG tablet TAKE 1 TABLET BY MOUTH EVERY DAY 04/02/19   Lorretta Harp, MD  dexamethasone (DECADRON) 4 MG tablet One tab po bid the day before, day of and day after chemo every 3 weeks. 10/13/18   [name], MD  diphenhydrAMINE (BENADRYL) 25 mg capsule Take 25 mg by mouth daily as needed for allergies.     [provider]  folic acid (FOLVITE) 1 MG tablet Take 1 tablet (1 mg total) by mouth daily. 10/13/18   [name], MD  ibuprofen  (ADVIL,MOTRIN) 200 MG tablet Take 400 mg by mouth every 6 (six) hours as needed for headache or moderate pain.     [provider]  MAGNESIUM-OXIDE 400 (241.3 Mg) MG tablet TAKE 1 TABLET BY MOUTH THREE TIMES DAILY 03/01/19   [name], MD  metoprolol succinate (TOPROL-XL) 100 MG 24 hr tablet TAKE 1 TABLET BY MOUTH EVERY DAY 02/02/19   Lorretta Harp, MD  Multiple Vitamin (MULTIVITAMIN WITH MINERALS) TABS tablet Take 1 tablet by mouth daily.    [provider]  nitroGLYCERIN (NITROSTAT) 0.4 MG SL tablet PLACE 1 TABLET UNDER THE TONGUE EVERY 5 MINUTES AS NEEDED FOR CHEST PAIN 04/09/19   Lorretta Harp, MD  umeclidinium-vilanterol (ANORO ELLIPTA) 62.5-25 MCG/INH AEPB Inhale 1 puff into the lungs daily. 05/04/19   Garner Nash, DO    Family History Family History  Problem Relation Age of Onset  . Colon cancer Mother   . Heart disease Father   . Heart disease Paternal Grandfather     Social History Social History   Tobacco Use  . Smoking status: Former Smoker    Packs/day: 0.25    Years: 38.00    Pack years: 9.50    Types: Cigarettes    Quit date: 10/19/2018    Years since quitting: 0.6  . Smokeless tobacco: Never Used  . Tobacco comment: pack per day 12.17.19  Substance Use Topics  . Alcohol use: Yes    Comment: socially  . Drug use: No     Allergies   Tyloxapol   Review of Systems Review of Systems   Physical Exam Triage Vital Signs ED Triage Vitals  Enc Vitals Group     BP 06/08/19 1009 (!) 150/84     Pulse Rate 06/08/19 1009 86     Resp 06/08/19 1009 18     Temp 06/08/19 1009 100.1 F (37.8 C)     Temp src --      SpO2 06/08/19 1009 100 %     Weight --      Height --      Head Circumference --      Peak Flow --      Pain Score 06/08/19 1010 0     Pain Loc --      Pain Edu? --      Excl. in [place]? --    No data found.  Updated Vital Signs BP (!) 150/84   Pulse 86   Temp 100.1 F (37.8 C)   Resp 18   SpO2 100%   Visual  Acuity Right Eye Distance:   Left Eye Distance:   Bilateral Distance:    Right Eye Near:   Left Eye Near:    Bilateral Near:  Physical Exam Vitals signs and nursing note reviewed.  Constitutional:      General: He is not in acute distress.    Appearance: Normal appearance. He is not ill-appearing, toxic-appearing or diaphoretic.  HENT:     Head: Normocephalic and atraumatic.     Right Ear: Tympanic membrane and ear canal normal.     Left Ear: Tympanic membrane and ear canal normal.     Nose: Nose normal.     Mouth/Throat:     Pharynx: Oropharynx is clear.  Eyes:     Conjunctiva/sclera: Conjunctivae normal.  Neck:     Musculoskeletal: Normal range of motion.  Cardiovascular:     Rate and Rhythm: Normal rate and regular rhythm.     Pulses: Normal pulses.     Heart sounds: Normal heart sounds.  Pulmonary:     Effort: Pulmonary effort is normal.     Breath sounds: Normal breath sounds.  Abdominal:     Palpations: Abdomen is soft.     Tenderness: There is no abdominal tenderness.  Musculoskeletal: Normal range of motion.  Skin:    General: Skin is warm and dry.     Findings: No rash.  Neurological:     Mental Status: He is alert.  Psychiatric:        Mood and Affect: Mood normal.      UC Treatments / Results  Labs (all labs ordered are listed, but only abnormal results are displayed) Labs Reviewed  CBC WITH DIFFERENTIAL/PLATELET - Abnormal; Notable for the following components:      Result Value   MCH 35.7 (*)    MCHC 36.8 (*)    Platelets 418 (*)    Monocytes Absolute 1.2 (*)    All other components within normal limits  COMPREHENSIVE METABOLIC PANEL - Abnormal; Notable for the following components:   Sodium 127 (*)    Chloride 91 (*)    Glucose, Bld 109 (*)    BUN <5 (*)    All other components within normal limits  NOVEL CORONAVIRUS, NAA (HOSP ORDER, SEND-OUT TO REF LAB; TAT 18-24 HRS)    EKG   Radiology Dg Chest 2 View  Result Date:  06/08/2019 CLINICAL DATA:  Fever x2 weeks. Additional history provided: No shortness of breath, cough or wheezing. Former smoker. EXAM: CHEST - 2 VIEW COMPARISON:  Chest CT 02/16/2019 FINDINGS: Heart size within normal limits. Tortuous thoracic aorta with aortic calcifications. Redemonstrated postsurgical changes from previous right upper lobectomy. Emphysema. No focal consolidation within the lungs. No evidence of pleural effusion or pneumothorax. No acute bony abnormality. Unchanged T11 superior endplate deformity. IMPRESSION: No focal consolidation within the lungs. Redemonstrated emphysema with sequela of previous right upper lobectomy. Aortic atherosclerosis. Electronically Signed   By: Kellie Simmering DO   On: 06/08/2019 11:12    Procedures Procedures (including critical care time)  Medications Ordered in UC Medications - No data to display  Initial Impression / Assessment and Plan / UC Course  I have reviewed the triage vital signs and the nursing notes.  Pertinent labs & imaging results that were available during my care of the patient were reviewed by me and considered in my medical decision making (see chart for details).     63 year old male with significant comorbidity here for fever over 2 weeks. Currently taking abx for sinus infection. Still having fever as high as 102 controlled with tylenol.  Chest x ray without any acute abnormalities here today.  Blood work similar to previous  labs in the past.  Sodium 127 which is mildly lower than normal. No current source of fever that is infectious.  Most likely viral Spoke with pt over the phone and gave lab results Recommended drinking something with electrolytes to increase sodium We repeated his COVID test to rule this out.  Otherwise exam normal and he is non toxic or ill appearing.  VSS reccommended he follow up with PCP if COVID is negative  If fever persists he may want to follow up with infectious disease Pt understanding  and agreed.    Final Clinical Impressions(s) / UC Diagnoses   Final diagnoses:  Fever, unspecified     Discharge Instructions     Your chest x-ray was normal.  We will call you with your lab results when we receive those. Should take a few days for your Covid results. Continue taking the medication that you have been taking If this progresses you may want to follow-up with your primary care or infectious disease specialist.    ED Prescriptions    None     PDMP not reviewed this encounter.   Orvan July, NP 06/08/19 [phone]

## 2019-06-08 NOTE — Discharge Instructions (Signed)
Your chest x-ray was normal.  We will call you with your lab results when we receive those. Should take a few days for your Covid results. Continue taking the medication that you have been taking If this progresses you may want to follow-up with your primary care or infectious disease specialist.

## 2019-06-08 NOTE — ED Triage Notes (Addendum)
Pt states he was tested two weeks ago for possible covid exposure, and he feels like he may have tested himself too early. Pt c/o off and on fever for the last several weeks. Pt also c/o headaches, and sob, but states hes had a lobectomy so he has some baseline SOB. Pt states hes on amoxicillin, he was given that several days ago due to fever. He is taking amoxicillin for possible sinus infection. Was told to return if it wasn't helping. States its not helping.

## 2019-06-10 LAB — NOVEL CORONAVIRUS, NAA (HOSP ORDER, SEND-OUT TO REF LAB; TAT 18-24 HRS): SARS-CoV-2, NAA: NOT DETECTED

## 2019-06-18 ENCOUNTER — Telehealth: Payer: Self-pay | Admitting: Internal Medicine

## 2019-06-18 ENCOUNTER — Other Ambulatory Visit: Payer: Self-pay

## 2019-06-18 ENCOUNTER — Ambulatory Visit (HOSPITAL_COMMUNITY)
Admission: RE | Admit: 2019-06-18 | Discharge: 2019-06-18 | Disposition: A | Discharge status: Home / Self Care | Payer: BC Managed Care – PPO | Source: Ambulatory Visit | Attending: Internal Medicine | Admitting: Internal Medicine

## 2019-06-18 ENCOUNTER — Encounter (HOSPITAL_COMMUNITY): Payer: Self-pay

## 2019-06-18 ENCOUNTER — Inpatient Hospital Stay: Payer: BC Managed Care – PPO | Attending: Internal Medicine

## 2019-06-18 DIAGNOSIS — C3411 Malignant neoplasm of upper lobe, right bronchus or lung: Secondary | ICD-10-CM | POA: Insufficient documentation

## 2019-06-18 DIAGNOSIS — C3490 Malignant neoplasm of unspecified part of unspecified bronchus or lung: Secondary | ICD-10-CM | POA: Diagnosis not present

## 2019-06-18 DIAGNOSIS — R918 Other nonspecific abnormal finding of lung field: Secondary | ICD-10-CM | POA: Diagnosis not present

## 2019-06-18 HISTORY — DX: Malignant (primary) neoplasm, unspecified: C80.1

## 2019-06-18 LAB — CBC WITH DIFFERENTIAL (CANCER CENTER ONLY)
Abs Immature Granulocytes: 0.03 10*3/uL (ref 0.00–0.07)
Basophils Absolute: 0.1 10*3/uL (ref 0.0–0.1)
Basophils Relative: 1 %
Eosinophils Absolute: 0.2 10*3/uL (ref 0.0–0.5)
Eosinophils Relative: 2 %
HCT: 41.4 % (ref 39.0–52.0)
Hemoglobin: 14.8 g/dL (ref 13.0–17.0)
Immature Granulocytes: 0 %
Lymphocytes Relative: 34 %
Lymphs Abs: 2.3 10*3/uL (ref 0.7–4.0)
MCH: 35.2 pg — ABNORMAL HIGH (ref 26.0–34.0)
MCHC: 35.7 g/dL (ref 30.0–36.0)
MCV: 98.3 fL (ref 80.0–100.0)
Monocytes Absolute: 1.1 10*3/uL — ABNORMAL HIGH (ref 0.1–1.0)
Monocytes Relative: 17 %
Neutro Abs: 3.1 10*3/uL (ref 1.7–7.7)
Neutrophils Relative %: 46 %
Platelet Count: 451 10*3/uL — ABNORMAL HIGH (ref 150–400)
RBC: 4.21 MIL/uL — ABNORMAL LOW (ref 4.22–5.81)
RDW: 12.6 % (ref 11.5–15.5)
WBC Count: 6.9 10*3/uL (ref 4.0–10.5)
nRBC: 0 % (ref 0.0–0.2)

## 2019-06-18 LAB — CMP (CANCER CENTER ONLY)
ALT: 18 U/L (ref 0–44)
AST: 28 U/L (ref 15–41)
Albumin: 3.5 g/dL (ref 3.5–5.0)
Alkaline Phosphatase: 100 U/L (ref 38–126)
Anion gap: 9 (ref 5–15)
BUN: 6 mg/dL — ABNORMAL LOW (ref 8–23)
CO2: 26 mmol/L (ref 22–32)
Calcium: 9.3 mg/dL (ref 8.9–10.3)
Chloride: 97 mmol/L — ABNORMAL LOW (ref 98–111)
Creatinine: 0.85 mg/dL (ref 0.61–1.24)
GFR, Est AFR Am: >60 mL/min (ref >60)
GFR, Estimated: >60 mL/min (ref >60)
Glucose, Bld: 90 mg/dL (ref 70–99)
Potassium: 4 mmol/L (ref 3.5–5.1)
Sodium: 132 mmol/L — ABNORMAL LOW (ref 135–145)
Total Bilirubin: 0.5 mg/dL (ref 0.3–1.2)
Total Protein: 7.2 g/dL (ref 6.5–8.1)

## 2019-06-18 LAB — MAGNESIUM: Magnesium: 1.6 mg/dL — ABNORMAL LOW (ref 1.7–2.4)

## 2019-06-18 MED ORDER — SODIUM CHLORIDE (PF) 0.9 % IJ SOLN
INTRAMUSCULAR | Status: AC
  Filled 2019-06-18: qty 50

## 2019-06-18 MED ORDER — IOHEXOL 300 MG/ML  SOLN
75.0000 mL | Freq: Once | INTRAMUSCULAR | Status: AC | PRN
  Administered 2019-06-18: 75 mL via INTRAVENOUS

## 2019-06-18 NOTE — Telephone Encounter (Signed)
Called patient to verify mychart visit for pre reg

## 2019-06-18 NOTE — Telephone Encounter (Signed)
Converted 11/23 appointment to mychart visit. Confirmed with patient.

## 2019-06-21 ENCOUNTER — Encounter: Payer: Self-pay | Admitting: Internal Medicine

## 2019-06-21 ENCOUNTER — Inpatient Hospital Stay (HOSPITAL_BASED_OUTPATIENT_CLINIC_OR_DEPARTMENT_OTHER): Payer: BC Managed Care – PPO | Admitting: Internal Medicine

## 2019-06-21 DIAGNOSIS — C3491 Malignant neoplasm of unspecified part of right bronchus or lung: Secondary | ICD-10-CM | POA: Diagnosis not present

## 2019-06-21 DIAGNOSIS — C3490 Malignant neoplasm of unspecified part of unspecified bronchus or lung: Secondary | ICD-10-CM | POA: Diagnosis not present

## 2019-06-21 NOTE — Progress Notes (Signed)
[place] [phone]   [phone]  PROGRESS NOTE FOR TELEMEDICINE VISITS  [name], NP [place] [address]  I connected with@ on 06/21/19 at 10:45 AM EST by telephone visit and verified that I am speaking with the correct person using two identifiers.   I discussed the limitations, risks, security and privacy concerns of performing an evaluation and management service by telemedicine and the availability of in-person appointments. I also discussed with the patient that there may be a patient responsible charge related to this service. The patient expressed understanding and agreed to proceed.  Other persons participating in the visit and their role in the encounter: None.  Patient's location:  Home Provider's location: [facility]  DIAGNOSIS: Stage IIIa (T3, N1, M0) non-small cell lung cancer, adenocarcinoma with no actionable mutations presented with multiple pulmonary nodules in the right upper lobe as well as metastatic disease in intraparenchymal lymphadenopathy. This was diagnosed in February 2020.  PRIOR THERAPY: 1) Status post right upper lobectomy with lymph node dissection. 2) Adjuvant systemic chemotherapy with cisplatin 75 mg/M2 and Alimta 500 mg/M2 every 3 weeks.  Status post 4 cycles.  CURRENT THERAPY: Observation.  INTERVAL HISTORY: Sean Rose 63 y.o. male has a telephone virtual visit with me today for evaluation and recommendation regarding his condition and discussion of his scan results.  The patient is feeling fine today with no concerning complaints.  He denied having any chest pain, shortness of breath except with exertion with no cough or hemoptysis.  He denied having any fever or chills.  He has no nausea, vomiting, diarrhea or constipation.  He has no headache or visual changes.  He had repeat CT scan of the chest performed recently and he is here for evaluation and discussion  of his discuss results.  MEDICAL HISTORY: Past Medical History:  Diagnosis Date   Anginal pain ([place])    CAD (coronary artery disease)    CAP (community acquired pneumonia) 09/2016   COPD (chronic obstructive pulmonary disease) (HCC)    GERD (gastroesophageal reflux disease)    Heart murmur    "I was told I had one when I was a kid"   Hemorrhoids    History of anal fissures    "no surgeries" (10/30/2016)   Hyperlipidemia    Hypertension    lung ca dx'd 08/2018   Peripheral arterial disease ([place])    status post right common iliac artery stenting back in 2007   Seasonal allergies    Tobacco abuse     ALLERGIES:  is allergic to tyloxapol.  MEDICATIONS:  Current Outpatient Medications  Medication Sig Dispense Refill   amLODipine (NORVASC) 10 MG tablet Take 1 tablet (10 mg total) by mouth daily. 90 tablet 0   amoxicillin (AMOXIL) 500 MG capsule Take 1 capsule (500 mg total) by mouth 3 (three) times daily. 21 capsule 0   aspirin EC 81 MG tablet Take 81 mg by mouth daily.     clopidogrel (PLAVIX) 75 MG tablet TAKE 1 TABLET BY MOUTH EVERY DAY 90 tablet 0   dexamethasone (DECADRON) 4 MG tablet One tab po bid the day before, day of and day after chemo every 3 weeks. 40 tablet 0   diphenhydrAMINE (BENADRYL) 25 mg capsule Take 25 mg by mouth daily as needed for allergies.      folic acid (FOLVITE) 1 MG tablet Take 1 tablet (1 mg total) by mouth daily. 30 tablet 4   ibuprofen (ADVIL,MOTRIN)  200 MG tablet Take 400 mg by mouth every 6 (six) hours as needed for headache or moderate pain.      MAGNESIUM-OXIDE 400 (241.3 Mg) MG tablet TAKE 1 TABLET BY MOUTH THREE TIMES DAILY 90 tablet 1   metoprolol succinate (TOPROL-XL) 100 MG 24 hr tablet TAKE 1 TABLET BY MOUTH EVERY DAY 90 tablet 2   Multiple Vitamin (MULTIVITAMIN WITH MINERALS) TABS tablet Take 1 tablet by mouth daily.     nitroGLYCERIN (NITROSTAT) 0.4 MG SL tablet PLACE 1 TABLET UNDER THE TONGUE EVERY 5 MINUTES AS  NEEDED FOR CHEST PAIN 25 tablet 6   umeclidinium-vilanterol (ANORO ELLIPTA) 62.5-25 MCG/INH AEPB Inhale 1 puff into the lungs daily. 30 each 6   No current facility-administered medications for this visit.     SURGICAL HISTORY:  Past Surgical History:  Procedure Laterality Date   ANKLE SURGERY Left    "rebuilt it"   ANTERIOR CRUCIATE LIGAMENT REPAIR Right    CARDIAC CATHETERIZATION  09/23/2008   Continued medical therapy - may need GI evaluation in addition.   CARDIAC CATHETERIZATION  10/28/2007   Medical therapy recommended.   CARDIAC CATHETERIZATION  11/18/2006   In-stent restenosis RCA  (50% distal edge, 80% segmental mid, and 50-60% segmental proximal). Successful cutting balloon atherectomy using a [id number] cutting balloon. 3 inflations with atherectomy performed on mid and proximal portions resulting in reduction of 80% mid in-stent restenosis to less than 20% residual and 50-60% segmental proximal to less than 20% residual without dissection.   CARDIAC CATHETERIZATION  02/26/2006   Severe stenosis in RCA. Stenting performed using IVUS. 3.5x20 Maverick balloon deployed at [employer]. Distal stent-a 4x28 Liberte stent-deployed 12atm 48sec, 12atm 31sec, 4atm 19sec. Mid stent-a 4x28 Liberte stent-deployed 14atm 45sec, 14atm 60sec, 14atm 44sec. Proximal stent-4x8 Liberte- 14atm 45sec,14atm 47sec, 16atm 43sec. Severely diseased segment then appeared TIMI-3 flow.   CARDIOVASCULAR STRESS TEST  11/17/2012   No significant ECG changes. Septal perfusion defect is new when complared to study from 2010. Abnormal myocardial perfusion imaging with a basal to mid perfusion suggestive of previous MI.   CAROTID DOPPLER  08/09/2011   Bilateral Bulb/Proximal ICA - demonstrated a mild amount of fibrous plaque without evidence of significant diameter reduction reduction or other vascular abnormality.   CHEST TUBE INSERTION Right 09/02/2018   Procedure: Chest Tube Insertion;  Surgeon: Garner Nash, DO;   Location: [place];  Service: Thoracic;  Laterality: Right;   COLONOSCOPY     2003, 2014   CORONARY ANGIOPLASTY WITH STENT PLACEMENT     FEMORAL ARTERY STENT     INGUINAL HERNIA REPAIR Right    KNEE ARTHROSCOPY Right "multiple"   LAPAROSCOPIC APPENDECTOMY N/A 12/07/2018   Procedure: APPENDECTOMY LAPAROSCOPIC;  Surgeon: Coralie Keens, MD;  Location: [facility];  Service: General;  Laterality: N/A;   LOWER EXTREMITY ARTERIAL DOPPLER  01/31/2011   Bilateral ABIs-normal values with no suggestion of arterial insuff to the lower extremities at rest. Right CIA stent-mild amount of nonhemodynamically significant plaque is noted throughout   [place]   "bone-eating tumor"   PERCUTANEOUS STENT INTERVENTION  04/04/2006 & 04/13/2015   a. Right common iliac artery with an 8.0x18 mm Herculink stent deployed at 12 atm. Stenosis was reduced from 80% to 0% with brisk flow. b. I-cast stenting to left common iliac artery   PERIPHERAL VASCULAR CATHETERIZATION N/A 04/13/2015   Procedure: Lower Extremity Angiography;  Surgeon: Lorretta Harp, MD; L-oCIA 75%, 40-50% L-EIA, R-CIA stent patent, s/p 8 mm x 38  mm ICast covered stent>>0% stenosis in L-oCIA      SHOULDER ARTHROSCOPY WITH ROTATOR CUFF REPAIR Right    TRANSTHORACIC ECHOCARDIOGRAM  11/26/2012   EF not noted. Aortic valve-sclerosis without stenosis, no regurgiation.    UPPER GASTROINTESTINAL ENDOSCOPY     US CAROTID DOPPLER BILATERAL ([place] HX)  08/09/2011   Bilateral Bulb/Proximal ICAa demonstrated a mild amount of fibrous plaque without evidence of significant diameter reduction or any other vascular abnormality.   VIDEO ASSISTED THORACOSCOPY (VATS)/ LOBECTOMY Right 09/14/2018   Procedure: RIGHT VIDEO ASSISTED THORACOSCOPY (VATS)/ RIGHT UPPER LOBECTOMY;  Surgeon: Melrose Nakayama, MD;  Location: [place];  Service: Thoracic;  Laterality: Right;   VIDEO BRONCHOSCOPY WITH ENDOBRONCHIAL NAVIGATION N/A 09/02/2018   Procedure: VIDEO  BRONCHOSCOPY WITH ENDOBRONCHIAL NAVIGATION;  Surgeon: Garner Nash, DO;  Location: [place];  Service: Thoracic;  Laterality: N/A;    REVIEW OF SYSTEMS:  A comprehensive review of systems was negative except for: Respiratory: positive for dyspnea on exertion    LABORATORY DATA: Lab Results  Component Value Date   WBC 6.9 06/18/2019   HGB 14.8 06/18/2019   HCT 41.4 06/18/2019   MCV 98.3 06/18/2019   PLT 451 (H) 06/18/2019      Chemistry      Component Value Date/Time   NA 132 (L) 06/18/2019 0912   NA 130 (L) 03/05/2017 1110   K 4.0 06/18/2019 0912   CL 97 (L) 06/18/2019 0912   CO2 26 06/18/2019 0912   BUN 6 (L) 06/18/2019 0912   BUN 4 (L) 03/05/2017 1110   CREATININE 0.85 06/18/2019 0912   CREATININE 0.58 (L) 05/22/2015 1041      Component Value Date/Time   CALCIUM 9.3 06/18/2019 0912   ALKPHOS 100 06/18/2019 0912   AST 28 06/18/2019 0912   ALT 18 06/18/2019 0912   BILITOT 0.5 06/18/2019 0912       RADIOGRAPHIC STUDIES: Dg Chest 2 View  Result Date: 06/08/2019 CLINICAL DATA:  Fever x2 weeks. Additional history provided: No shortness of breath, cough or wheezing. Former smoker. EXAM: CHEST - 2 VIEW COMPARISON:  Chest CT 02/16/2019 FINDINGS: Heart size within normal limits. Tortuous thoracic aorta with aortic calcifications. Redemonstrated postsurgical changes from previous right upper lobectomy. Emphysema. No focal consolidation within the lungs. No evidence of pleural effusion or pneumothorax. No acute bony abnormality. Unchanged T11 superior endplate deformity. IMPRESSION: No focal consolidation within the lungs. Redemonstrated emphysema with sequela of previous right upper lobectomy. Aortic atherosclerosis. Electronically Signed   By: Kellie Simmering DO   On: 06/08/2019 11:12   Ct Chest W Contrast  Result Date: 06/18/2019 CLINICAL DATA:  Stage IIIA right upper lobe non-small cell lung cancer status post right upper lobectomy 09/14/2018 and adjuvant chemotherapy.  Restaging. Interval observation. EXAM: CT CHEST WITH CONTRAST TECHNIQUE: Multidetector CT imaging of the chest was performed during intravenous contrast administration. CONTRAST:  68m OMNIPAQUE IOHEXOL 300 MG/ML  SOLN COMPARISON:  02/16/2019 chest CT. FINDINGS: Cardiovascular: Normal heart size. No significant pericardial effusion/thickening. Left anterior descending and right coronary atherosclerosis. Normal caliber pulmonary arteries. No central pulmonary emboli. Mediastinum/Nodes: No discrete thyroid nodules. Unremarkable esophagus. No pathologically enlarged axillary, mediastinal or hilar lymph nodes. Lungs/Pleura: Status post right upper lobectomy. No pneumothorax. No pleural effusion. Severe centrilobular emphysema. No acute consolidative airspace disease or lung masses. A few scattered small solid left upper lobe pulmonary nodules, largest 6 mm anteriorly (series 7/image 73), all stable. No new significant pulmonary nodules. Upper abdomen: Stable subcentimeter hypodense posterior right liver lesion,  too small to characterize. Musculoskeletal: No aggressive appearing focal osseous lesions. Moderate thoracic spondylosis. IMPRESSION: 1. No evidence of local tumor recurrence status post right upper lobectomy. 2. No findings suspicious for metastatic disease in the chest. Stable subcentimeter left upper lobe pulmonary nodules. Aortic Atherosclerosis (ICD10-I70.0) and Emphysema (ICD10-J43.9). Electronically Signed   By: Ilona Sorrel M.D.   On: 06/18/2019 15:02    ASSESSMENT AND PLAN: This is a very pleasant 63 years old white male with a stage IIIB non-small cell lung cancer, adenocarcinoma with no actionable mutations status post right upper lobectomy with lymph node dissection under the care of [name]. The patient completed a course of adjuvant treatment with cisplatin and Alimta status post 4 cycles.  He tolerated the previous 4 cycles of his treatment fairly well. The patient is currently on  observation and he is feeling fine today with no concerning complaints. He had repeat CT scan of the chest performed recently.  I personally and independently reviewed the scans and discussed the results with the patient today. Has a scan showed no concerning findings for disease recurrence or progression. I recommended for him to continue on observation with repeat CT scan of the chest in 6 months. He was advised to call immediately if he has any concerning symptoms in the interval. I discussed the assessment and treatment plan with the patient. The patient was provided an opportunity to ask questions and all were answered. The patient agreed with the plan and demonstrated an understanding of the instructions.   The patient was advised to call back or seek an in-person evaluation if the symptoms worsen or if the condition fails to improve as anticipated.  I provided 11 minutes of non face-to-face telephone visit time during this encounter, and > 50% was spent counseling as documented under my assessment & plan.  Eilleen Kempf, MD 06/21/2019 10:45 AM  Disclaimer: This note was dictated with voice recognition software. Similar sounding words can inadvertently be transcribed and may not be corrected upon review.

## 2019-06-22 ENCOUNTER — Telehealth: Payer: Self-pay | Admitting: Internal Medicine

## 2019-06-22 NOTE — Telephone Encounter (Signed)
Scheduled per los. Called and left msg. Mailed printout  °

## 2019-06-29 ENCOUNTER — Other Ambulatory Visit: Payer: Self-pay | Admitting: Cardiovascular Disease

## 2019-06-29 ENCOUNTER — Other Ambulatory Visit: Payer: Self-pay | Admitting: Physician Assistant

## 2019-07-07 ENCOUNTER — Encounter: Payer: Self-pay | Admitting: *Deleted

## 2019-07-22 ENCOUNTER — Other Ambulatory Visit: Payer: Self-pay

## 2019-07-22 ENCOUNTER — Ambulatory Visit (INDEPENDENT_AMBULATORY_CARE_PROVIDER_SITE_OTHER): Payer: BC Managed Care – PPO

## 2019-07-22 ENCOUNTER — Encounter (HOSPITAL_COMMUNITY): Payer: Self-pay | Admitting: Emergency Medicine

## 2019-07-22 ENCOUNTER — Ambulatory Visit (HOSPITAL_COMMUNITY)
Admission: EM | Admit: 2019-07-22 | Discharge: 2019-07-22 | Disposition: A | Discharge status: Home / Self Care | Payer: BC Managed Care – PPO | Attending: Urgent Care | Admitting: Urgent Care

## 2019-07-22 DIAGNOSIS — R2242 Localized swelling, mass and lump, left lower limb: Secondary | ICD-10-CM | POA: Diagnosis not present

## 2019-07-22 DIAGNOSIS — M79672 Pain in left foot: Secondary | ICD-10-CM

## 2019-07-22 DIAGNOSIS — M7732 Calcaneal spur, left foot: Secondary | ICD-10-CM | POA: Diagnosis not present

## 2019-07-22 MED ORDER — PREDNISONE 10 MG PO TABS: 30.0000 mg | ORAL_TABLET | Freq: Every day | ORAL | 0 refills | Status: DC

## 2019-07-22 NOTE — ED Provider Notes (Signed)
[place]   MRN: [id number] DOB: 1956/03/13  Subjective:   Sean Rose is a 63 y.o. male presenting for 1 day history of recurrent worsening moderate to severe left foot pain with swelling.  Patient states that he was simply shopping when he felt a loud pop at the top of his left foot.  He had similar symptoms that back in August and was seen by an orthopedist.  Reports that they did an x-ray which was normal.  His symptoms resolved thereafter until this current episode.  He is not having difficulty bearing weight and ambulating on it.  Reports having had extensive surgery of his left ankle but this was years ago.  Patient does have a history of extensive physical activity, used to be a cross-country runner, did marathons.  He has not check back with his orthopedist.  No current facility-administered medications for this encounter.  Current Outpatient Medications:  .  amLODipine (NORVASC) 10 MG tablet, TAKE 1 TABLET(10 MG) BY MOUTH DAILY, Disp: 90 tablet, Rfl: 0 .  amoxicillin (AMOXIL) 500 MG capsule, Take 1 capsule (500 mg total) by mouth 3 (three) times daily., Disp: 21 capsule, Rfl: 0 .  aspirin EC 81 MG tablet, Take 81 mg by mouth daily., Disp: , Rfl:  .  clopidogrel (PLAVIX) 75 MG tablet, TAKE 1 TABLET BY MOUTH EVERY DAY, Disp: 90 tablet, Rfl: 0 .  dexamethasone (DECADRON) 4 MG tablet, One tab po bid the day before, day of and day after chemo every 3 weeks., Disp: 40 tablet, Rfl: 0 .  diphenhydrAMINE (BENADRYL) 25 mg capsule, Take 25 mg by mouth daily as needed for allergies. , Disp: , Rfl:  .  folic acid (FOLVITE) 1 MG tablet, Take 1 tablet (1 mg total) by mouth daily., Disp: 30 tablet, Rfl: 4 .  ibuprofen (ADVIL,MOTRIN) 200 MG tablet, Take 400 mg by mouth every 6 (six) hours as needed for headache or moderate pain. , Disp: , Rfl:  .  MAGNESIUM-OXIDE 400 (241.3 Mg) MG tablet, TAKE 1 TABLET BY MOUTH THREE TIMES DAILY, Disp: 90 tablet, Rfl: 1 .  metoprolol succinate  (TOPROL-XL) 100 MG 24 hr tablet, TAKE 1 TABLET BY MOUTH EVERY DAY, Disp: 90 tablet, Rfl: 2 .  Multiple Vitamin (MULTIVITAMIN WITH MINERALS) TABS tablet, Take 1 tablet by mouth daily., Disp: , Rfl:  .  nitroGLYCERIN (NITROSTAT) 0.4 MG SL tablet, PLACE 1 TABLET UNDER THE TONGUE EVERY 5 MINUTES AS NEEDED FOR CHEST PAIN, Disp: 25 tablet, Rfl: 6 .  umeclidinium-vilanterol (ANORO ELLIPTA) 62.5-25 MCG/INH AEPB, Inhale 1 puff into the lungs daily., Disp: 30 each, Rfl: 6   Allergies  Allergen Reactions  . Tyloxapol Nausea Only    Past Medical History:  Diagnosis Date  . Anginal pain ([place])   . CAD (coronary artery disease)   . CAP (community acquired pneumonia) 09/2016  . COPD (chronic obstructive pulmonary disease) ([place])   . GERD (gastroesophageal reflux disease)   . Heart murmur    "I was told I had one when I was a kid"  . Hemorrhoids   . History of anal fissures    "no surgeries" (10/30/2016)  . Hyperlipidemia   . Hypertension   . lung ca dx'd 08/2018  . Peripheral arterial disease ([place])    status post right common iliac artery stenting back in 2007  . Seasonal allergies   . Tobacco abuse      Past Surgical History:  Procedure Laterality Date  . ANKLE SURGERY Left    "  rebuilt it"  . ANTERIOR CRUCIATE LIGAMENT REPAIR Right   . CARDIAC CATHETERIZATION  09/23/2008   Continued medical therapy - may need GI evaluation in addition.  Marland Kitchen CARDIAC CATHETERIZATION  10/28/2007   Medical therapy recommended.  Marland Kitchen CARDIAC CATHETERIZATION  11/18/2006   In-stent restenosis RCA  (50% distal edge, 80% segmental mid, and 50-60% segmental proximal). Successful cutting balloon atherectomy using a [id number] cutting balloon. 3 inflations with atherectomy performed on mid and proximal portions resulting in reduction of 80% mid in-stent restenosis to less than 20% residual and 50-60% segmental proximal to less than 20% residual without dissection.  Marland Kitchen CARDIAC CATHETERIZATION  02/26/2006   Severe stenosis in RCA.  Stenting performed using IVUS. 3.5x20 Maverick balloon deployed at [employer]. Distal stent-a 4x28 Liberte stent-deployed 12atm 48sec, 12atm 31sec, 4atm 19sec. Mid stent-a 4x28 Liberte stent-deployed 14atm 45sec, 14atm 60sec, 14atm 44sec. Proximal stent-4x8 Liberte- 14atm 45sec,14atm 47sec, 16atm 43sec. Severely diseased segment then appeared TIMI-3 flow.  Marland Kitchen CARDIOVASCULAR STRESS TEST  11/17/2012   No significant ECG changes. Septal perfusion defect is new when complared to study from 2010. Abnormal myocardial perfusion imaging with a basal to mid perfusion suggestive of previous MI.  Marland Kitchen CAROTID DOPPLER  08/09/2011   Bilateral Bulb/Proximal ICA - demonstrated a mild amount of fibrous plaque without evidence of significant diameter reduction reduction or other vascular abnormality.  . CHEST TUBE INSERTION Right 09/02/2018   Procedure: Chest Tube Insertion;  Surgeon: Garner Nash, DO;  Location: [facility];  Service: Thoracic;  Laterality: Right;  . COLONOSCOPY     2003, 2014  . CORONARY ANGIOPLASTY WITH STENT PLACEMENT    . FEMORAL ARTERY STENT    . INGUINAL HERNIA REPAIR Right   . KNEE ARTHROSCOPY Right "multiple"  . LAPAROSCOPIC APPENDECTOMY N/A 12/07/2018   Procedure: APPENDECTOMY LAPAROSCOPIC;  Surgeon: Coralie Keens, MD;  Location[facility];  Service: General;  Laterality: N/A;  . LOWER EXTREMITY ARTERIAL DOPPLER  01/31/2011   Bilateral ABIs-normal values with no suggestion of arterial insuff to the lower extremities at rest. Right CIA stent-mild amount of nonhemodynamically significant plaque is noted throughout  . 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 PERCUTANEOUS STENT INTERVENTION  04/04/2006 & 04/13/2015   a. Right common iliac artery with an 8.0x18 mm Herculink stent deployed at 12 atm. Stenosis was reduced from 80% to 0% with brisk flow. b. I-cast stenting to left common iliac artery  . PERIPHERAL VASCULAR CATHETERIZATION N/A 04/13/2015   Procedure: Lower Extremity Angiography;   Surgeon: Lorretta Harp, MD; L-oCIA 75%, 40-50% L-EIA, R-CIA stent patent, s/p 8 mm x 38 mm ICast covered stent>>0% stenosis in L-oCIA     . SHOULDER ARTHROSCOPY WITH ROTATOR CUFF REPAIR Right   . TRANSTHORACIC ECHOCARDIOGRAM  11/26/2012   EF not noted. Aortic valve-sclerosis without stenosis, no regurgiation.   Marland Kitchen UPPER GASTROINTESTINAL ENDOSCOPY    . US CAROTID DOPPLER BILATERAL ([place] HX)  08/09/2011   Bilateral Bulb/Proximal ICAa demonstrated a mild amount of fibrous plaque without evidence of significant diameter reduction or any other vascular abnormality.  Marland Kitchen VIDEO ASSISTED THORACOSCOPY (VATS)/ LOBECTOMY Right 09/14/2018   Procedure: RIGHT VIDEO ASSISTED THORACOSCOPY (VATS)/ RIGHT UPPER LOBECTOMY;  Surgeon: Melrose Nakayama, MD;  Location: [place];  Service: Thoracic;  Laterality: Right;  Marland Kitchen VIDEO BRONCHOSCOPY WITH ENDOBRONCHIAL NAVIGATION N/A 09/02/2018   Procedure: VIDEO BRONCHOSCOPY WITH ENDOBRONCHIAL NAVIGATION;  Surgeon: Garner Nash, DO;  Location: [facility];  Service: Thoracic;  Laterality: N/A;    Family History  Problem Relation Age of Onset  . Colon cancer Mother   . Heart disease Father   . Heart disease Paternal Grandfather     Social History   Tobacco Use  . Smoking status: Former Smoker    Packs/day: 0.25    Years: 38.00    Pack years: 9.50    Types: Cigarettes    Quit date: 10/19/2018    Years since quitting: 0.7  . Smokeless tobacco: Never Used  . Tobacco comment: pack per day 12.17.19  Substance Use Topics  . Alcohol use: Yes    Comment: socially  . Drug use: No    ROS Denies fever, redness, bruising, bleeding, trauma, falls, twisting his foot, history of gout.  Objective:   Vitals: BP (!) 161/114 (BP Location: Right Arm)   Pulse 71   Temp 98.7 F (37.1 C) (Oral)   Resp 18   SpO2 100%   Physical Exam Constitutional:      General: He is not in acute distress.    Appearance: Normal appearance. He is well-developed and normal weight. He is not  ill-appearing, toxic-appearing or diaphoretic.  HENT:     Head: Normocephalic and atraumatic.     Right Ear: External ear normal.     Left Ear: External ear normal.     Nose: Nose normal.     Mouth/Throat:     Pharynx: Oropharynx is clear.  Eyes:     General: No scleral icterus.       Right eye: No discharge.        Left eye: No discharge.     Extraocular Movements: Extraocular movements intact.     Pupils: Pupils are equal, round, and reactive to light.  Cardiovascular:     Rate and Rhythm: Normal rate.  Pulmonary:     Effort: Pulmonary effort is normal.  Musculoskeletal:     Cervical back: Normal range of motion.     Left foot: Decreased range of motion. Normal capillary refill. Swelling (trace over areas depicted), tenderness (over dorsal aspect of his left foot as depicted) and bony tenderness present. No laceration or crepitus. Normal pulse.       Legs:  Neurological:     Mental Status: He is alert and oriented to person, place, and time.  Psychiatric:        Mood and Affect: Mood normal.        Behavior: Behavior normal.        Thought Content: Thought content normal.        Judgment: Judgment normal.     DG Foot Complete Left  Result Date: 07/22/2019 CLINICAL DATA:  Acute onset of LEFT foot pain after an audible ?pop? which was thought to emanate from the metatarsal region. No discrete injury. Pain localizing to the base of the fifth metatarsal. EXAM: LEFT FOOT - COMPLETE 3+ VIEW COMPARISON:  None. FINDINGS: Mild DORSAL soft tissue swelling. No evidence of acute fracture or dislocation. Joint spaces well preserved for patient age. Bone mineral density well-preserved. Small plantar calcaneal spur. No other intrinsic osseous abnormalities. IMPRESSION: 1. No acute osseous abnormality. 2. Small plantar calcaneal spur. Electronically Signed   By: Evangeline Dakin M.D.   On: 07/22/2019 10:54    Assessment and Plan :   1. Foot pain, left   2. Localized swelling of left foot      Will use conservative management with postop shoe, prednisone course as patient is not a candidate to use NSAIDs.  Recommended he follow-up with his  orthopedist as soon as possible for further imaging given recurrence and worsening symptoms for his foot pain with 2 negative x-rays. Counseled patient on potential for adverse effects with medications prescribed/recommended today, ER and return-to-clinic precautions discussed, patient verbalized understanding.    Jaynee Eagles, PA-C 07/22/19 1122

## 2019-07-22 NOTE — Discharge Instructions (Addendum)
Make sure you set up a follow-up visit with your orthopedist to see if they can do more imaging including a CT or an MRI.  In the meantime will use a steroid course today to help you with your symptoms.  We will also have you wear a postop shoe to limit bending of the foot.

## 2019-07-22 NOTE — ED Triage Notes (Signed)
Pt is here for left foot pain onset yest.... reports he was shopping and felt a pop ... pain increases w/activity   Slow steady gait.... A&O x4... NAD.Marland Kitchen. ambulatory

## 2019-07-26 DIAGNOSIS — M25572 Pain in left ankle and joints of left foot: Secondary | ICD-10-CM | POA: Diagnosis not present

## 2019-08-11 DIAGNOSIS — S92325A Nondisplaced fracture of second metatarsal bone, left foot, initial encounter for closed fracture: Secondary | ICD-10-CM | POA: Diagnosis not present

## 2019-08-13 ENCOUNTER — Other Ambulatory Visit (HOSPITAL_COMMUNITY): Payer: Self-pay | Admitting: Cardiovascular Disease

## 2019-08-13 DIAGNOSIS — I739 Peripheral vascular disease, unspecified: Secondary | ICD-10-CM

## 2019-08-19 ENCOUNTER — Ambulatory Visit (HOSPITAL_BASED_OUTPATIENT_CLINIC_OR_DEPARTMENT_OTHER)
Admission: RE | Admit: 2019-08-19 | Discharge: 2019-08-19 | Disposition: A | Discharge status: Home / Self Care | Payer: BC Managed Care – PPO | Source: Ambulatory Visit | Attending: Cardiology | Admitting: Cardiology

## 2019-08-19 ENCOUNTER — Other Ambulatory Visit: Payer: Self-pay

## 2019-08-19 ENCOUNTER — Ambulatory Visit (HOSPITAL_COMMUNITY)
Admission: RE | Admit: 2019-08-19 | Discharge: 2019-08-19 | Disposition: A | Discharge status: Home / Self Care | Payer: BC Managed Care – PPO | Source: Ambulatory Visit | Attending: Cardiology | Admitting: Cardiology

## 2019-08-19 ENCOUNTER — Other Ambulatory Visit (HOSPITAL_COMMUNITY): Payer: Self-pay | Admitting: Cardiovascular Disease

## 2019-08-19 DIAGNOSIS — I739 Peripheral vascular disease, unspecified: Secondary | ICD-10-CM | POA: Insufficient documentation

## 2019-08-19 DIAGNOSIS — Z95828 Presence of other vascular implants and grafts: Secondary | ICD-10-CM

## 2019-08-23 ENCOUNTER — Other Ambulatory Visit: Payer: Self-pay

## 2019-08-23 DIAGNOSIS — I739 Peripheral vascular disease, unspecified: Secondary | ICD-10-CM

## 2019-08-23 DIAGNOSIS — I2510 Atherosclerotic heart disease of native coronary artery without angina pectoris: Secondary | ICD-10-CM

## 2019-08-24 ENCOUNTER — Telehealth: Payer: Self-pay | Admitting: *Deleted

## 2019-08-24 NOTE — Telephone Encounter (Signed)
A message was left, re: his follow up visit. 

## 2019-09-21 ENCOUNTER — Encounter: Payer: BC Managed Care – PPO | Admitting: Thoracic Surgery (Cardiothoracic Vascular Surgery)

## 2019-09-27 ENCOUNTER — Other Ambulatory Visit: Payer: Self-pay | Admitting: Thoracic Surgery (Cardiothoracic Vascular Surgery)

## 2019-09-27 ENCOUNTER — Other Ambulatory Visit: Payer: Self-pay | Admitting: Physician Assistant

## 2019-09-27 ENCOUNTER — Other Ambulatory Visit: Payer: Self-pay | Admitting: Cardiovascular Disease

## 2019-09-27 DIAGNOSIS — C3491 Malignant neoplasm of unspecified part of right bronchus or lung: Secondary | ICD-10-CM

## 2019-09-28 ENCOUNTER — Encounter: Payer: Self-pay | Admitting: Thoracic Surgery (Cardiothoracic Vascular Surgery)

## 2019-09-28 ENCOUNTER — Ambulatory Visit
Admission: RE | Admit: 2019-09-28 | Discharge: 2019-09-28 | Disposition: A | Discharge status: Home / Self Care | Payer: BC Managed Care – PPO | Source: Ambulatory Visit | Attending: Thoracic Surgery (Cardiothoracic Vascular Surgery) | Admitting: Thoracic Surgery (Cardiothoracic Vascular Surgery)

## 2019-09-28 ENCOUNTER — Other Ambulatory Visit: Payer: Self-pay

## 2019-09-28 ENCOUNTER — Ambulatory Visit (INDEPENDENT_AMBULATORY_CARE_PROVIDER_SITE_OTHER): Payer: BC Managed Care – PPO | Admitting: Thoracic Surgery (Cardiothoracic Vascular Surgery)

## 2019-09-28 VITALS — BP 140/80 | HR 84 | Resp 20 | Ht 68.0 in | Wt 145.0 lb

## 2019-09-28 DIAGNOSIS — J439 Emphysema, unspecified: Secondary | ICD-10-CM | POA: Diagnosis not present

## 2019-09-28 DIAGNOSIS — C3491 Malignant neoplasm of unspecified part of right bronchus or lung: Secondary | ICD-10-CM

## 2019-09-28 NOTE — Progress Notes (Signed)
[address]       [place] [address]             [phone]     HPI: Sean Rose returns for a scheduled 1 year follow-up visit  Sean Rose is a 64 year old man with a history of tobacco abuse and COPD.  He had a thoracoscopic right upper lobectomy for multiple right upper lobe lung nodules about a year ago.  He had a prolonged air leak postoperatively and went home with a chest tube.  He had T3N1 stage IIIa non-small cell carcinoma.  He underwent chemotherapy.  He now is in observation.  He says that he does not have quite the lung capacity had preoperatively.  He also sometimes feels a lack of energy.  Otherwise he feels well.  He is not having any acute respiratory issues and denies any pain. Past Medical History:  Diagnosis Date  . Anginal pain ([place])   . CAD (coronary artery disease)   . CAP (community acquired pneumonia) 09/2016  . COPD (chronic obstructive pulmonary disease) ([place])   . GERD (gastroesophageal reflux disease)   . Heart murmur    "I was told I had one when I was a kid"  . Hemorrhoids   . History of anal fissures    "no surgeries" (10/30/2016)  . Hyperlipidemia   . Hypertension   . lung ca dx'd 08/2018  . Peripheral arterial disease ([place])    status post right common iliac artery stenting back in 2007  . Seasonal allergies   . Tobacco abuse     Current Outpatient Medications  Medication Sig Dispense Refill  . amLODipine (NORVASC) 10 MG tablet TAKE 1 TABLET(10 MG) BY MOUTH DAILY 90 tablet 0  . amoxicillin (AMOXIL) 500 MG capsule Take 1 capsule (500 mg total) by mouth 3 (three) times daily. 21 capsule 0  . aspirin EC 81 MG tablet Take 81 mg by mouth daily.    . clopidogrel (PLAVIX) 75 MG tablet TAKE 1 TABLET BY MOUTH EVERY DAY 90 tablet 0  . dexamethasone (DECADRON) 4 MG tablet One tab po bid the day before, day of and day after chemo every 3 weeks. 40 tablet 0  . diphenhydrAMINE (BENADRYL) 25 mg capsule Take 25 mg by mouth daily as  needed for allergies.     . folic acid (FOLVITE) 1 MG tablet Take 1 tablet (1 mg total) by mouth daily. 30 tablet 4  . ibuprofen (ADVIL,MOTRIN) 200 MG tablet Take 400 mg by mouth every 6 (six) hours as needed for headache or moderate pain.     Marland Kitchen MAGNESIUM-OXIDE 400 (241.3 Mg) MG tablet TAKE 1 TABLET BY MOUTH THREE TIMES DAILY 90 tablet 1  . metoprolol succinate (TOPROL-XL) 100 MG 24 hr tablet TAKE 1 TABLET BY MOUTH EVERY DAY 90 tablet 2  . Multiple Vitamin (MULTIVITAMIN WITH MINERALS) TABS tablet Take 1 tablet by mouth daily.    . nitroGLYCERIN (NITROSTAT) 0.4 MG SL tablet PLACE 1 TABLET UNDER THE TONGUE EVERY 5 MINUTES AS NEEDED FOR CHEST PAIN 25 tablet 6  . predniSONE (DELTASONE) 10 MG tablet Take 3 tablets (30 mg total) by mouth daily with breakfast. 21 tablet 0  . umeclidinium-vilanterol (ANORO ELLIPTA) 62.5-25 MCG/INH AEPB Inhale 1 puff into the lungs daily. 30 each 6   No current facility-administered medications for this visit.    Physical Exam BP 140/80   Pulse 84   Resp 20   Ht 5\' 8"  (1.727 m)  Wt 145 lb (65.8 kg)   SpO2 96% Comment: RA  BMI 22.26 kg/m  64 year old man in no acute distress Alert and oriented x3 with no focal deficits Lungs diminished at right base but otherwise clear No cervical or supraclavicular adenopathy Cardiac regular rate and rhythm normal S1 and S2  Diagnostic Tests: CHEST - 2 VIEW  COMPARISON:  Chest x-ray and chest CT dated 06/08/2019  FINDINGS: Heart size is normal. Asymmetry of main pulmonary arteries due to previous right upper lobectomy. The appearance is stable. No infiltrates or effusions. Emphysematous changes in both upper lung zones, stable. Surgical staples in the right upper lung zone. No effusions.  No significant bone abnormality. Extensive aortic atherosclerosis.  IMPRESSION: No acute abnormality. Postsurgical changes as described.  Aortic Atherosclerosis (ICD10-I70.0) and Emphysema  (ICD10-J43.9).   Electronically Signed   By: Lorriane Shire M.D. I personally reviewed the chest x-ray images and concur with the findings noted above  Impression: Sean Rose is a 64 year old man with a history of tobacco abuse and COPD.  He underwent a right upper lobectomy for a T3, N1 non-small cell carcinoma about a year ago.  He was treated with postoperative adjuvant chemotherapy.  He is doing well with no residual ill effects from his surgery.  His respiratory status is stable and he understands that it will never return all the way to his baseline preoperatively.   There were no lingering surgical issues so I am going to release Sean Rose.  But I will be happy to see him back if I can help with his care.  Plan: Follow-up with Dr. [name] I will be happy to see Sean Rose back anytime in the future if I can be of any further assistance with his care.  Melrose Nakayama, MD [facility] [phone]

## 2019-10-05 IMAGING — DX DG CHEST 1V PORT
1 series · 1 of 1 positions shown · non-contrast
Comparison: 09/16/2018

CLINICAL DATA: Status post lobectomy

EXAM:
PORTABLE CHEST 1 VIEW

[chest ap]
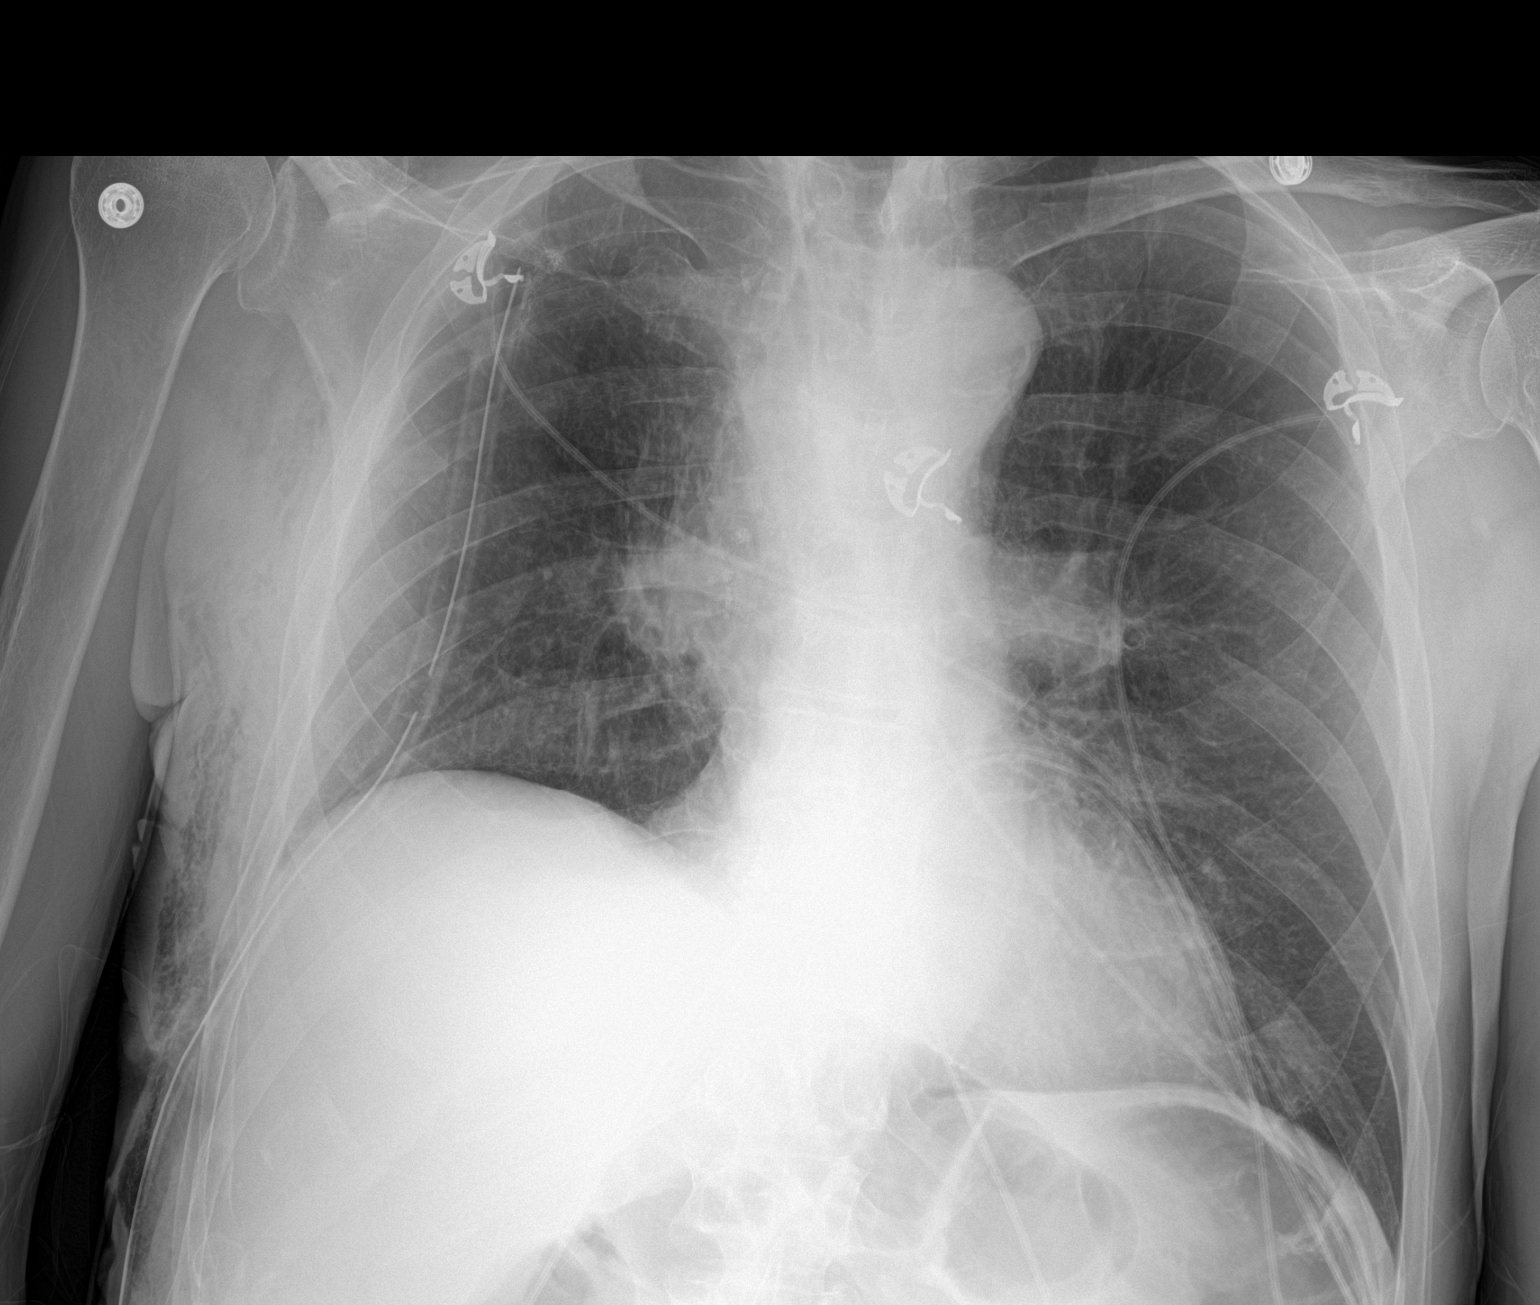

[1 of 1 positions shown; findings below may reference images not displayed]

FINDINGS: Right chest tube is stable. Right pneumothorax has resolved. Volume
loss in the right lung is stable compatible with lobectomy. Scarring
versus subsegmental atelectasis in the medial right lung. Left lung
is clear.
IMPRESSION: Right pneumothorax has resolved. Stable right chest tube and
postoperative changes.

## 2019-10-07 ENCOUNTER — Encounter: Payer: Self-pay | Admitting: Emergency Medicine

## 2019-10-17 IMAGING — DX DG CHEST 2V
2 series · 2 of 2 positions shown · non-contrast
Comparison: Single-view of the chest 09/24/2018 and 09/23/2018.

CLINICAL DATA: Patient status post right upper lobectomy
09/14/2018.

EXAM:
CHEST - 2 VIEW

[dg chest 2 view (1 of 2)]
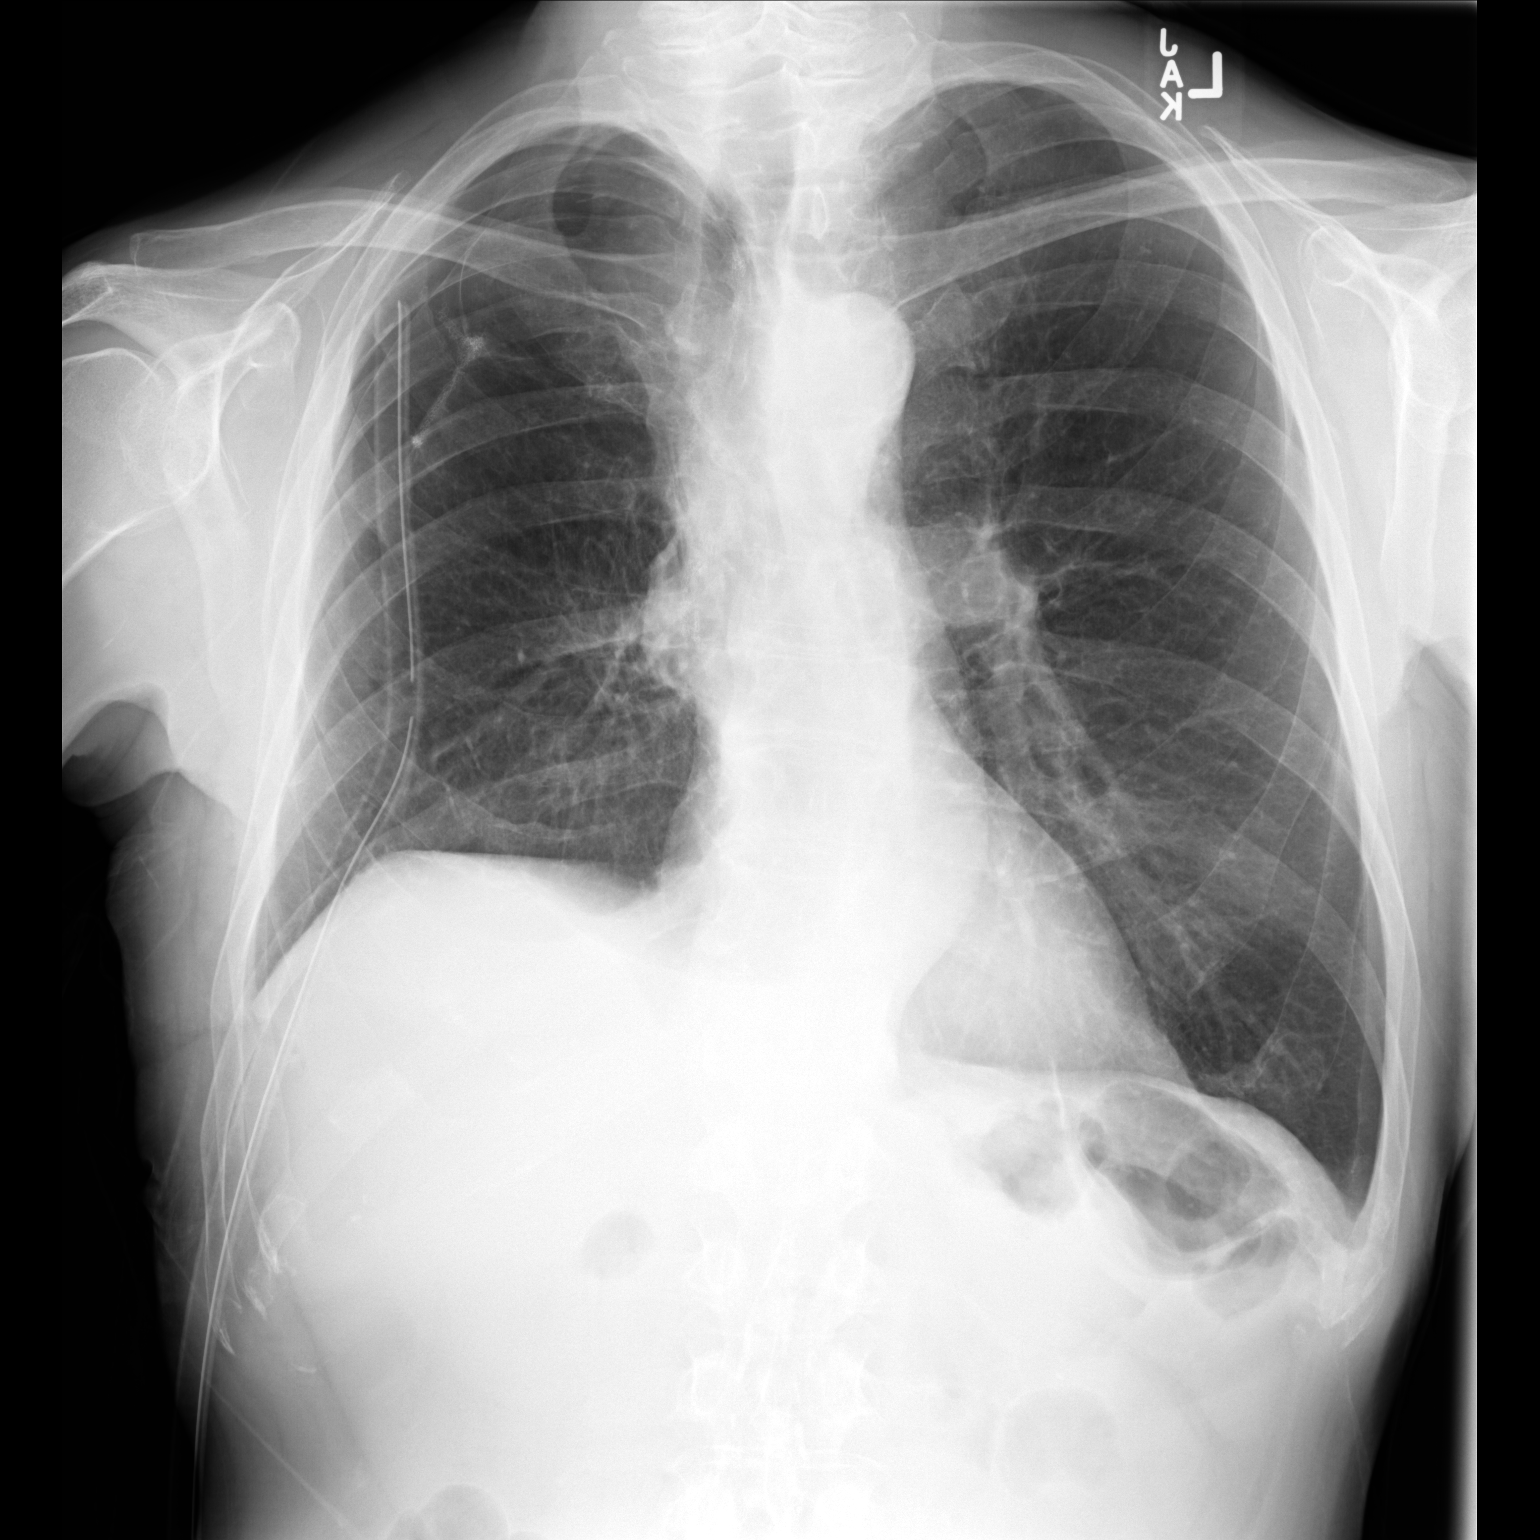

[dg chest 2 view (2 of 2)]
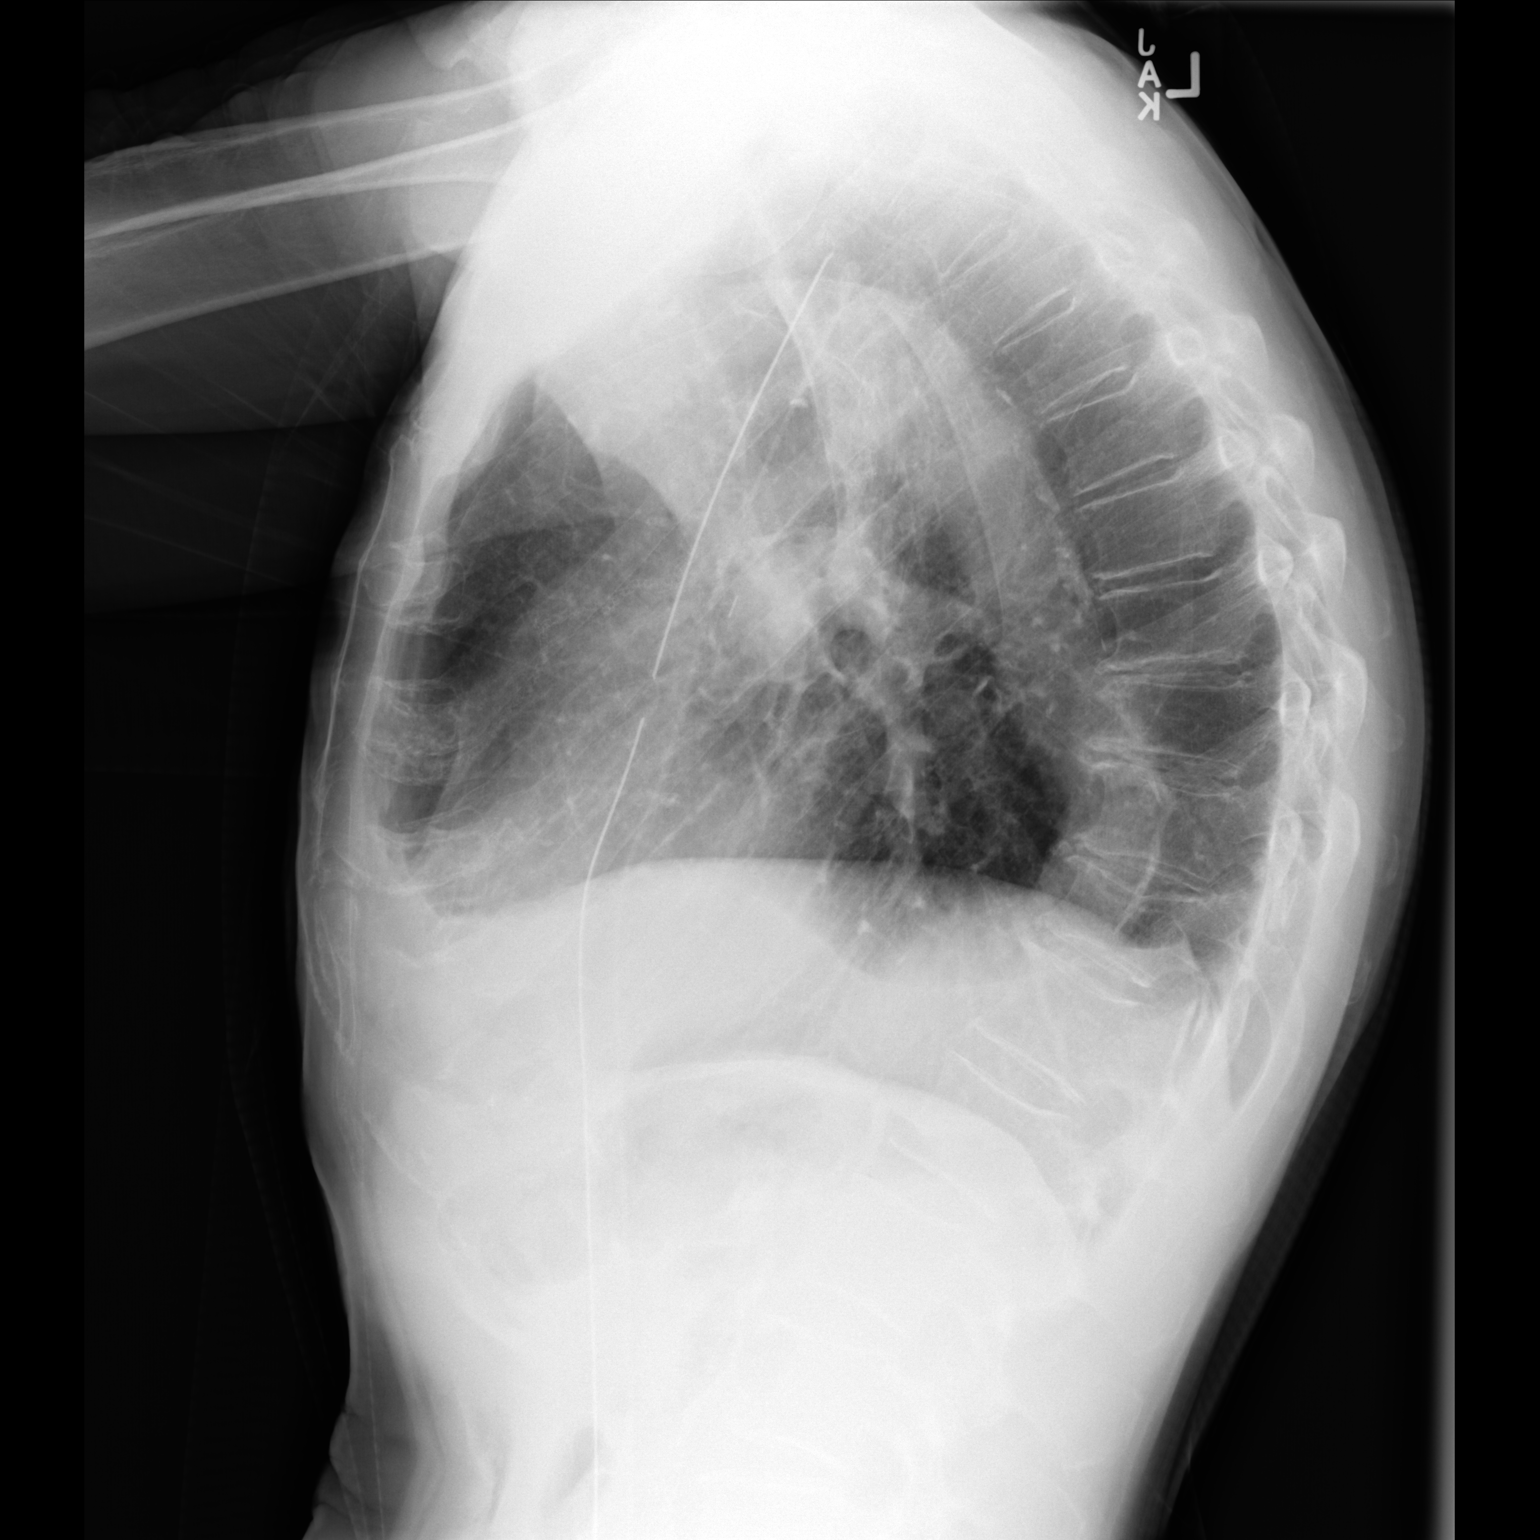

[2 of 2 positions shown; findings below may reference images not displayed]

FINDINGS: Right chest tube remains in place. Right pneumothorax seen on
yesterday's examination has slightly increased in size and is
estimated at 5-10%. Unchanged volume loss in the right chest
consistent with surgery noted. The left lung is expanded and clear.
Heart size is normal. Aortic atherosclerosis is seen.
IMPRESSION: Slight increase in a right pneumothorax estimated at 5-10%. Right
chest tube remains in place.

Lungs are clear.

Emphysema.

## 2019-10-24 IMAGING — DX CHEST - 2 VIEW
2 series · 2 of 2 positions shown · non-contrast
Comparison: 10/05/2018

CLINICAL DATA: History of post biopsy pneumothorax

EXAM:
CHEST - 2 VIEW

[dg chest 2 view (1 of 2)]
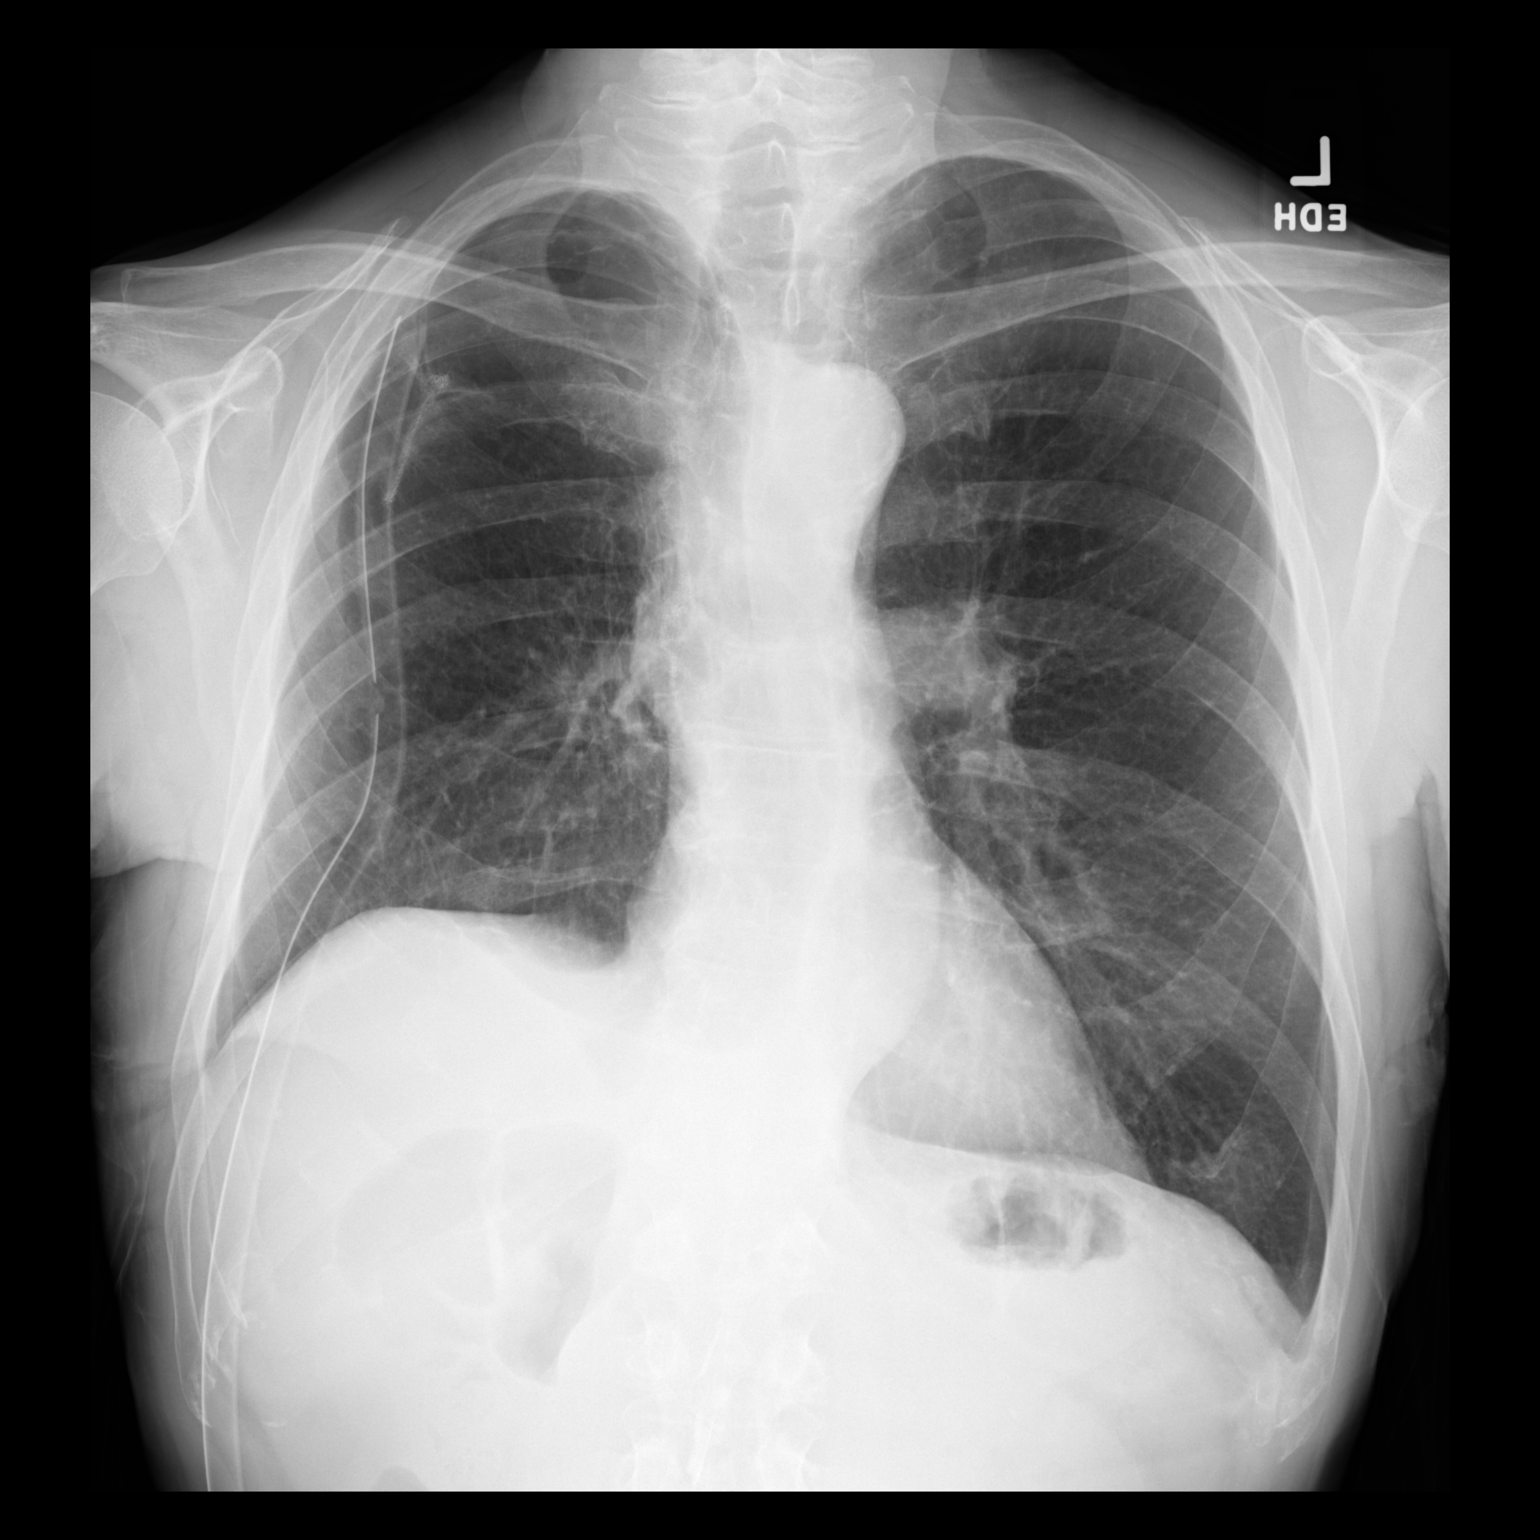

[dg chest 2 view (2 of 2)]
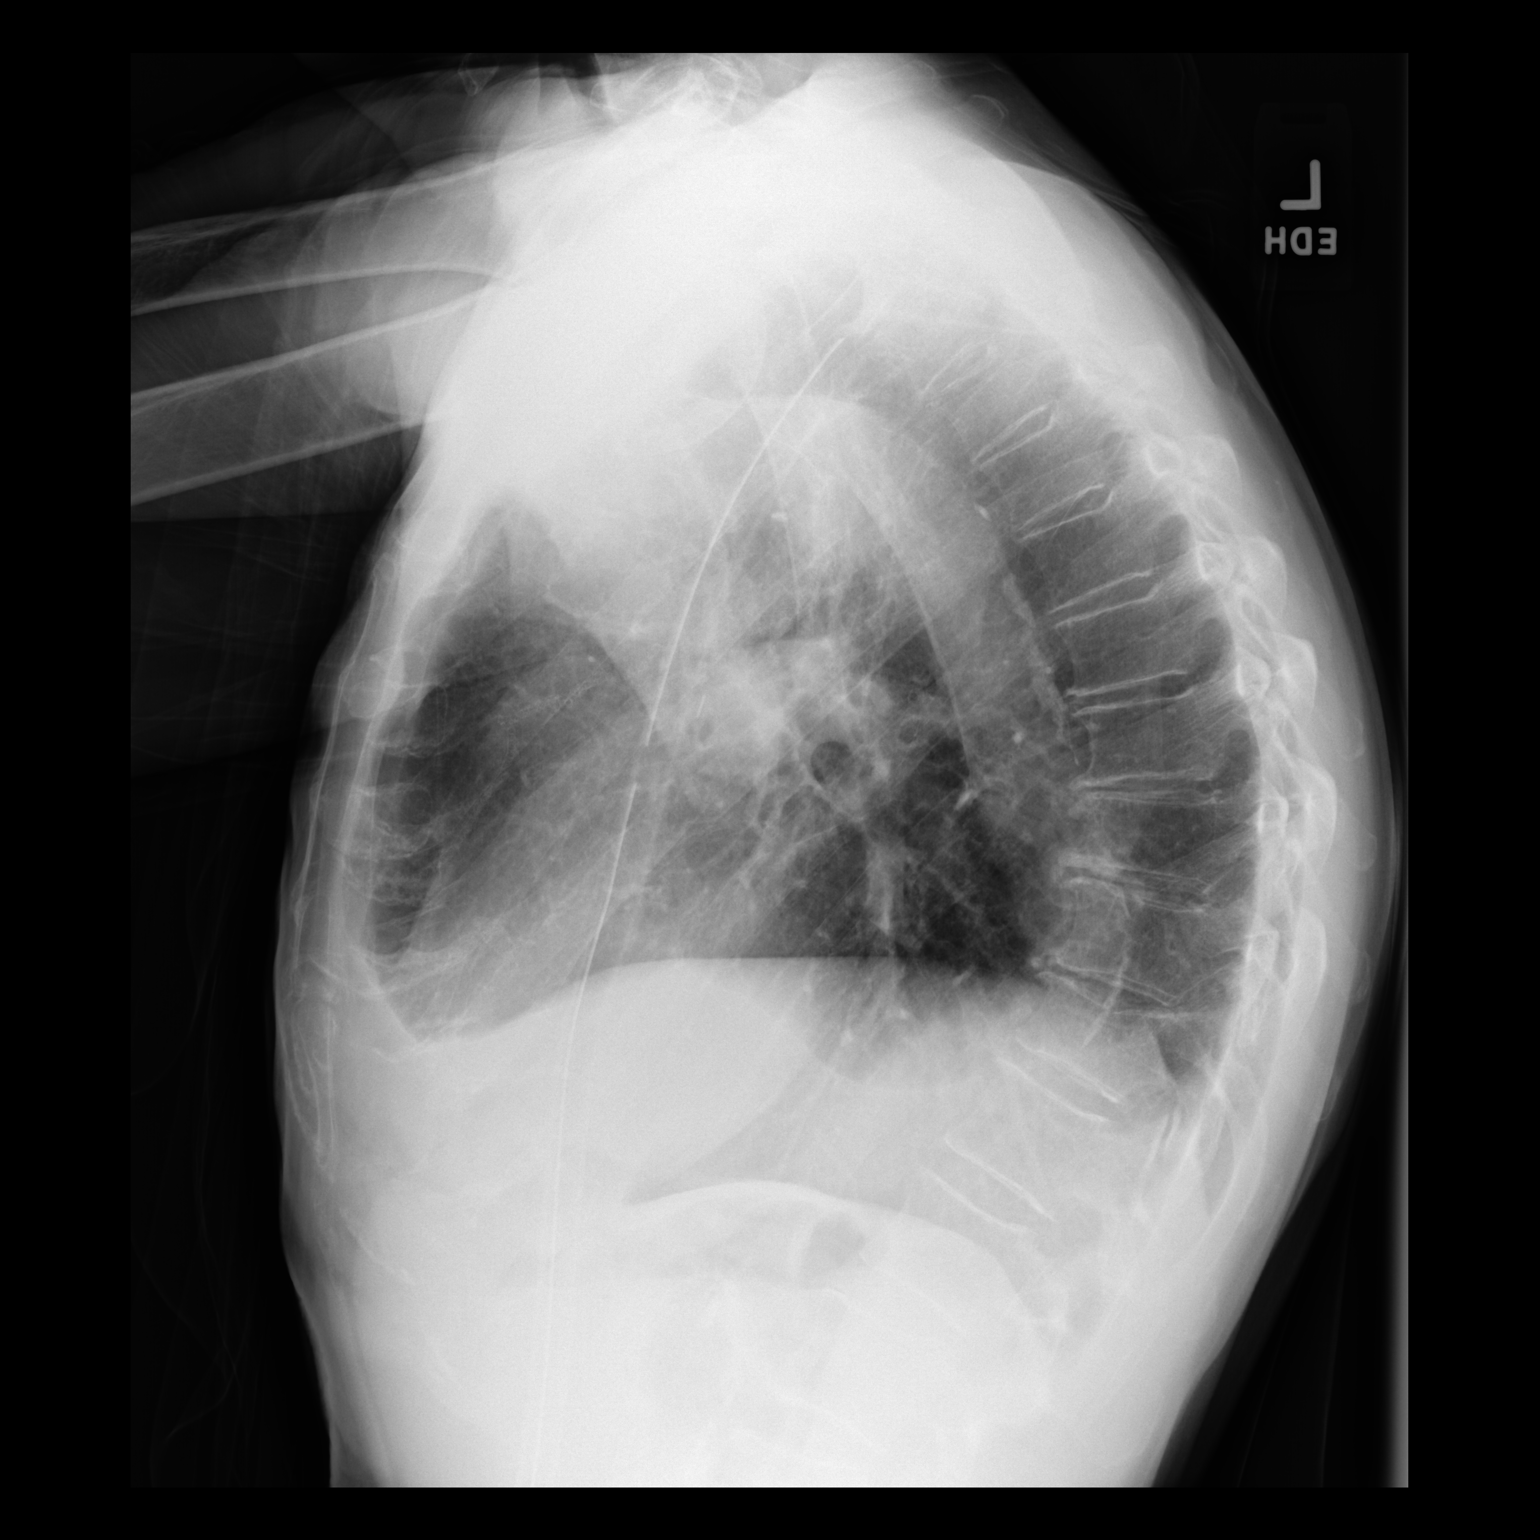

[2 of 2 positions shown; findings below may reference images not displayed]

FINDINGS: Cardiac shadow is within normal limits. Right chest tube is noted in
place. Postsurgical changes are again seen. Minimal right apical
pneumothorax is again identified. No new focal abnormality is seen.
IMPRESSION: Stable appearance of the chest when compared with the previous day.

## 2019-10-30 ENCOUNTER — Other Ambulatory Visit: Payer: Self-pay | Admitting: Cardiovascular Disease

## 2019-12-17 ENCOUNTER — Inpatient Hospital Stay: Payer: BC Managed Care – PPO | Attending: Internal Medicine

## 2019-12-17 ENCOUNTER — Other Ambulatory Visit: Payer: Self-pay

## 2019-12-17 ENCOUNTER — Ambulatory Visit (HOSPITAL_COMMUNITY)
Admission: RE | Admit: 2019-12-17 | Discharge: 2019-12-17 | Disposition: A | Discharge status: Home / Self Care | Payer: BC Managed Care – PPO | Source: Ambulatory Visit | Attending: Internal Medicine | Admitting: Internal Medicine

## 2019-12-17 DIAGNOSIS — C3490 Malignant neoplasm of unspecified part of unspecified bronchus or lung: Secondary | ICD-10-CM | POA: Diagnosis not present

## 2019-12-17 DIAGNOSIS — Z9221 Personal history of antineoplastic chemotherapy: Secondary | ICD-10-CM | POA: Insufficient documentation

## 2019-12-17 DIAGNOSIS — Z85038 Personal history of other malignant neoplasm of large intestine: Secondary | ICD-10-CM | POA: Insufficient documentation

## 2019-12-17 LAB — CMP (CANCER CENTER ONLY)
ALT: 24 U/L (ref 0–44)
AST: 35 U/L (ref 15–41)
Albumin: 4 g/dL (ref 3.5–5.0)
Alkaline Phosphatase: 85 U/L (ref 38–126)
Anion gap: 7 (ref 5–15)
BUN: 5 mg/dL — ABNORMAL LOW (ref 8–23)
CO2: 27 mmol/L (ref 22–32)
Calcium: 9.4 mg/dL (ref 8.9–10.3)
Chloride: 96 mmol/L — ABNORMAL LOW (ref 98–111)
Creatinine: 0.84 mg/dL (ref 0.61–1.24)
GFR, Est AFR Am: >60 mL/min (ref >60)
GFR, Estimated: >60 mL/min (ref >60)
Glucose, Bld: 105 mg/dL — ABNORMAL HIGH (ref 70–99)
Potassium: 4.3 mmol/L (ref 3.5–5.1)
Sodium: 130 mmol/L — ABNORMAL LOW (ref 135–145)
Total Bilirubin: 0.7 mg/dL (ref 0.3–1.2)
Total Protein: 7.2 g/dL (ref 6.5–8.1)

## 2019-12-17 LAB — CBC WITH DIFFERENTIAL (CANCER CENTER ONLY)
Abs Immature Granulocytes: 0.02 10*3/uL (ref 0.00–0.07)
Basophils Absolute: 0.1 10*3/uL (ref 0.0–0.1)
Basophils Relative: 1 %
Eosinophils Absolute: 0.3 10*3/uL (ref 0.0–0.5)
Eosinophils Relative: 4 %
HCT: 40.5 % (ref 39.0–52.0)
Hemoglobin: 14.5 g/dL (ref 13.0–17.0)
Immature Granulocytes: 0 %
Lymphocytes Relative: 35 %
Lymphs Abs: 2.7 10*3/uL (ref 0.7–4.0)
MCH: 35.1 pg — ABNORMAL HIGH (ref 26.0–34.0)
MCHC: 35.8 g/dL (ref 30.0–36.0)
MCV: 98.1 fL (ref 80.0–100.0)
Monocytes Absolute: 1 10*3/uL (ref 0.1–1.0)
Monocytes Relative: 12 %
Neutro Abs: 3.7 10*3/uL (ref 1.7–7.7)
Neutrophils Relative %: 48 %
Platelet Count: 285 10*3/uL (ref 150–400)
RBC: 4.13 MIL/uL — ABNORMAL LOW (ref 4.22–5.81)
RDW: 12.5 % (ref 11.5–15.5)
WBC Count: 7.8 10*3/uL (ref 4.0–10.5)
nRBC: 0 % (ref 0.0–0.2)

## 2019-12-17 MED ORDER — SODIUM CHLORIDE (PF) 0.9 % IJ SOLN
INTRAMUSCULAR | Status: AC
  Filled 2019-12-17: qty 50

## 2019-12-17 MED ORDER — IOHEXOL 300 MG/ML  SOLN
75.0000 mL | Freq: Once | INTRAMUSCULAR | Status: AC | PRN
  Administered 2019-12-17: 75 mL via INTRAVENOUS

## 2019-12-20 ENCOUNTER — Inpatient Hospital Stay (HOSPITAL_BASED_OUTPATIENT_CLINIC_OR_DEPARTMENT_OTHER): Payer: BC Managed Care – PPO | Admitting: Internal Medicine

## 2019-12-20 ENCOUNTER — Other Ambulatory Visit: Payer: Self-pay

## 2019-12-20 ENCOUNTER — Encounter: Payer: Self-pay | Admitting: Internal Medicine

## 2019-12-20 VITALS — BP 130/80 | HR 62 | Temp 100.0°F | Resp 18 | Ht 68.0 in | Wt 144.3 lb

## 2019-12-20 DIAGNOSIS — Z85038 Personal history of other malignant neoplasm of large intestine: Secondary | ICD-10-CM | POA: Diagnosis not present

## 2019-12-20 DIAGNOSIS — C3490 Malignant neoplasm of unspecified part of unspecified bronchus or lung: Secondary | ICD-10-CM | POA: Diagnosis not present

## 2019-12-20 DIAGNOSIS — C3491 Malignant neoplasm of unspecified part of right bronchus or lung: Secondary | ICD-10-CM | POA: Diagnosis not present

## 2019-12-20 DIAGNOSIS — Z9221 Personal history of antineoplastic chemotherapy: Secondary | ICD-10-CM | POA: Diagnosis not present

## 2019-12-20 NOTE — Progress Notes (Signed)
[facility] Telephone:(336) [phone]   Fax:(336) [phone]  OFFICE PROGRESS NOTE  [name], NP [place] [address]  DIAGNOSIS: Stage IIIa (T3, N1, M0) non-small cell lung cancer, adenocarcinoma with no actionable mutations presented with multiple pulmonary nodules in the right upper lobe as well as metastatic disease in intraparenchymal lymphadenopathy.  This was diagnosed in February 2020.  PRIOR THERAPY: 1) Status post right upper lobectomy with lymph node dissection. 2) Adjuvant systemic chemotherapy with cisplatin 75 mg/M2 and Alimta 500 mg/M2 every 3 weeks.  Status post 4 cycles.  CURRENT THERAPY: Observation.  INTERVAL HISTORY: Sean Rose 64 y.o. male returns to the clinic today for follow-up visit.  The patient is feeling fine today with no concerning complaints.  He gained several pounds since his last visit.  He denied having any current chest pain, shortness of breath, cough or hemoptysis.  He denied having any fever or chills.  He has no nausea, vomiting, diarrhea or constipation.  He received the 2 shots of Covid vaccine.  The patient had repeat CT scan of the chest performed recently and he is here for evaluation and discussion of his discuss results.  MEDICAL HISTORY: Past Medical History:  Diagnosis Date  . Anginal pain ([employer])   . CAD (coronary artery disease)   . CAP (community acquired pneumonia) 09/2016  . COPD (chronic obstructive pulmonary disease) ([place])   . GERD (gastroesophageal reflux disease)   . Heart murmur    "I was told I had one when I was a kid"  . Hemorrhoids   . History of anal fissures    "no surgeries" (10/30/2016)  . Hyperlipidemia   . Hypertension   . lung ca dx'd 08/2018  . Peripheral arterial disease ([place])    status post right common iliac artery stenting back in 2007  . Seasonal allergies   . Tobacco abuse     ALLERGIES:  is allergic to tyloxapol.  MEDICATIONS:  Current Outpatient  Medications  Medication Sig Dispense Refill  . amLODipine (NORVASC) 10 MG tablet TAKE 1 TABLET(10 MG) BY MOUTH DAILY 90 tablet 1  . amoxicillin (AMOXIL) 500 MG capsule Take 1 capsule (500 mg total) by mouth 3 (three) times daily. 21 capsule 0  . aspirin EC 81 MG tablet Take 81 mg by mouth daily.    . clopidogrel (PLAVIX) 75 MG tablet TAKE 1 TABLET BY MOUTH EVERY DAY 90 tablet 1  . dexamethasone (DECADRON) 4 MG tablet One tab po bid the day before, day of and day after chemo every 3 weeks. 40 tablet 0  . diphenhydrAMINE (BENADRYL) 25 mg capsule Take 25 mg by mouth daily as needed for allergies.     . folic acid (FOLVITE) 1 MG tablet Take 1 tablet (1 mg total) by mouth daily. 30 tablet 4  . ibuprofen (ADVIL,MOTRIN) 200 MG tablet Take 400 mg by mouth every 6 (six) hours as needed for headache or moderate pain.     Marland Kitchen MAGNESIUM-OXIDE 400 (241.3 Mg) MG tablet TAKE 1 TABLET BY MOUTH THREE TIMES DAILY 90 tablet 1  . metoprolol succinate (TOPROL-XL) 100 MG 24 hr tablet TAKE 1 TABLET BY MOUTH EVERY DAY 90 tablet 2  . Multiple Vitamin (MULTIVITAMIN WITH MINERALS) TABS tablet Take 1 tablet by mouth daily.    . nitroGLYCERIN (NITROSTAT) 0.4 MG SL tablet PLACE 1 TABLET UNDER THE TONGUE EVERY 5 MINUTES AS NEEDED FOR CHEST PAIN 25 tablet 6  . predniSONE (DELTASONE) 10  MG tablet Take 3 tablets (30 mg total) by mouth daily with breakfast. 21 tablet 0  . umeclidinium-vilanterol (ANORO ELLIPTA) 62.5-25 MCG/INH AEPB Inhale 1 puff into the lungs daily. 30 each 6   No current facility-administered medications for this visit.    SURGICAL HISTORY:  Past Surgical History:  Procedure Laterality Date  . ANKLE SURGERY Left    "rebuilt it"  . ANTERIOR CRUCIATE LIGAMENT REPAIR Right   . CARDIAC CATHETERIZATION  09/23/2008   Continued medical therapy - may need GI evaluation in addition.  Marland Kitchen CARDIAC CATHETERIZATION  10/28/2007   Medical therapy recommended.  Marland Kitchen CARDIAC CATHETERIZATION  11/18/2006   In-stent restenosis RCA   (50% distal edge, 80% segmental mid, and 50-60% segmental proximal). Successful cutting balloon atherectomy using a [id number] cutting balloon. 3 inflations with atherectomy performed on mid and proximal portions resulting in reduction of 80% mid in-stent restenosis to less than 20% residual and 50-60% segmental proximal to less than 20% residual without dissection.  Marland Kitchen CARDIAC CATHETERIZATION  02/26/2006   Severe stenosis in RCA. Stenting performed using IVUS. 3.5x20 Maverick balloon deployed at [employer]. Distal stent-a 4x28 Liberte stent-deployed 12atm 48sec, 12atm 31sec, 4atm 19sec. Mid stent-a 4x28 Liberte stent-deployed 14atm 45sec, 14atm 60sec, 14atm 44sec. Proximal stent-4x8 Liberte- 14atm 45sec,14atm 47sec, 16atm 43sec. Severely diseased segment then appeared TIMI-3 flow.  Marland Kitchen CARDIOVASCULAR STRESS TEST  11/17/2012   No significant ECG changes. Septal perfusion defect is new when complared to study from 2010. Abnormal myocardial perfusion imaging with a basal to mid perfusion suggestive of previous MI.  Marland Kitchen CAROTID DOPPLER  08/09/2011   Bilateral Bulb/Proximal ICA - demonstrated a mild amount of fibrous plaque without evidence of significant diameter reduction reduction or other vascular abnormality.  . CHEST TUBE INSERTION Right 09/02/2018   Procedure: Chest Tube Insertion;  Surgeon: Garner Nash, DO;  Location: [facility];  Service: Thoracic;  Laterality: Right;  . COLONOSCOPY     2003, 2014  . CORONARY ANGIOPLASTY WITH STENT PLACEMENT    . FEMORAL ARTERY STENT    . INGUINAL HERNIA REPAIR Right   . KNEE ARTHROSCOPY Right "multiple"  . LAPAROSCOPIC APPENDECTOMY N/A 12/07/2018   Procedure: APPENDECTOMY LAPAROSCOPIC;  Surgeon: Coralie Keens, MD;  Location: [facility];  Service: General;  Laterality: N/A;  . LOWER EXTREMITY ARTERIAL DOPPLER  01/31/2011   Bilateral ABIs-normal values with no suggestion of arterial insuff to the lower extremities at rest. Right CIA stent-mild amount of nonhemodynamically  significant plaque is noted throughout  . 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 PERCUTANEOUS STENT INTERVENTION  04/04/2006 & 04/13/2015   a. Right common iliac artery with an 8.0x18 mm Herculink stent deployed at 12 atm. Stenosis was reduced from 80% to 0% with brisk flow. b. I-cast stenting to left common iliac artery  . PERIPHERAL VASCULAR CATHETERIZATION N/A 04/13/2015   Procedure: Lower Extremity Angiography;  Surgeon: Lorretta Harp, MD; L-oCIA 75%, 40-50% L-EIA, R-CIA stent patent, s/p 8 mm x 38 mm ICast covered stent>>0% stenosis in L-oCIA     . SHOULDER ARTHROSCOPY WITH ROTATOR CUFF REPAIR Right   . TRANSTHORACIC ECHOCARDIOGRAM  11/26/2012   EF not noted. Aortic valve-sclerosis without stenosis, no regurgiation.   Marland Kitchen UPPER GASTROINTESTINAL ENDOSCOPY    . US CAROTID DOPPLER BILATERAL ([place] [facility])  08/09/2011   Bilateral Bulb/Proximal ICAa demonstrated a mild amount of fibrous plaque without evidence of significant diameter reduction or any other vascular abnormality.  Marland Kitchen VIDEO ASSISTED THORACOSCOPY (VATS)/ LOBECTOMY Right 09/14/2018  Procedure: RIGHT VIDEO ASSISTED THORACOSCOPY (VATS)/ RIGHT UPPER LOBECTOMY;  Surgeon: Hendrickson, Steven C, MD;  Location: [facility];  Service: Thoracic;  Laterality: Right;  . VIDEO BRONCHOSCOPY WITH ENDOBRONCHIAL NAVIGATION N/A 09/02/2018   Procedure: VIDEO BRONCHOSCOPY WITH ENDOBRONCHIAL NAVIGATION;  Surgeon: Icard, Bradley L, DO;  Location: [facility];  Service: Thoracic;  Laterality: N/A;    REVIEW OF SYSTEMS:  A comprehensive review of systems was negative.   PHYSICAL EXAMINATION: General appearance: alert, cooperative and no distress Head: Normocephalic, without obvious abnormality, atraumatic Neck: no adenopathy, no JVD, supple, symmetrical, trachea midline and thyroid not enlarged, symmetric, no tenderness/mass/nodules Lymph nodes: Cervical, supraclavicular, and axillary nodes normal. Resp: clear to auscultation bilaterally Back: symmetric, no  curvature. ROM normal. No CVA tenderness. Cardio: regular rate and rhythm, S1, S2 normal, no murmur, click, rub or gallop GI: soft, non-tender; bowel sounds normal; no masses,  no organomegaly Extremities: extremities normal, atraumatic, no cyanosis or edema  ECOG PERFORMANCE STATUS: 1 - Symptomatic but completely ambulatory  Blood pressure 130/80, pulse 62, temperature 100 F (37.8 C), temperature source Temporal, resp. rate 18, height 5' 8" (1.727 m), weight 144 lb 4.8 oz (65.5 kg), SpO2 100 %.  LABORATORY DATA: Lab Results  Component Value Date   WBC 7.8 12/17/2019   HGB 14.5 12/17/2019   HCT 40.5 12/17/2019   MCV 98.1 12/17/2019   PLT 285 12/17/2019      Chemistry      Component Value Date/Time   NA 130 (L) 12/17/2019 1158   NA 130 (L) 03/05/2017 1110   K 4.3 12/17/2019 1158   CL 96 (L) 12/17/2019 1158   CO2 27 12/17/2019 1158   BUN 5 (L) 12/17/2019 1158   BUN 4 (L) 03/05/2017 1110   CREATININE 0.84 12/17/2019 1158   CREATININE 0.58 (L) 05/22/2015 1041      Component Value Date/Time   CALCIUM 9.4 12/17/2019 1158   ALKPHOS 85 12/17/2019 1158   AST 35 12/17/2019 1158   ALT 24 12/17/2019 1158   BILITOT 0.7 12/17/2019 1158       RADIOGRAPHIC STUDIES: CT Chest W Contrast  Result Date: 12/17/2019 CLINICAL DATA:  Non-small-cell lung cancer. History of right upper lobectomy. EXAM: CT CHEST WITH CONTRAST TECHNIQUE: Multidetector CT imaging of the chest was performed during intravenous contrast administration. CONTRAST:  75mL OMNIPAQUE IOHEXOL 300 MG/ML  SOLN COMPARISON:  06/18/2019. FINDINGS: Cardiovascular: The heart size is normal. No substantial pericardial effusion. Coronary artery calcification is evident. Atherosclerotic calcification is noted in the wall of the thoracic aorta. Mediastinum/Nodes: No mediastinal lymphadenopathy. There is no hilar lymphadenopathy. The esophagus has normal imaging features. There is no axillary lymphadenopathy. Lungs/Pleura: Centrilobular  and paraseptal emphysema evident. Surgical changes again noted right lung. 5 mm anterior left upper lobe nodule (57/7) was 6 mm previously. Tiny nodules in the lingula are similar to prior. No suspicious pulmonary nodule or mass. No focal airspace consolidation. No pleural effusion. Upper Abdomen: Unremarkable. Musculoskeletal: No worrisome lytic or sclerotic osseous abnormality. IMPRESSION: Stable exam. No new or progressive findings to suggest recurrent or metastatic disease. Emphysema (ICD10-J43.9) and Aortic Atherosclerosis (ICD10-170.0) Electronically Signed   By: Eric  Mansell M.D.   On: 12/17/2019 15:04    ASSESSMENT AND PLAN: This is a very pleasant 63 years old white male with a stage IIIB non-small cell lung cancer, adenocarcinoma with no actionable mutations status post right upper lobectomy with lymph node dissection under the care of Dr. Hendrickson. The patient completed a course of adjuvant treatment with cisplatin and   Alimta status post 4 cycles.  He tolerated the previous 4 cycles of his treatment fairly well. The patient has been in observation since that time and he is feeling fine. He had repeat CT scan of the chest performed recently.  I personally and independently reviewed the scans and discussed the results with the patient today. His scan showed no concerning findings for disease recurrence or metastasis. I recommended for him to continue on observation with repeat CT scan of the chest in 6 months. He was advised to call immediately if he has any concerning symptoms in the interval. The patient voices understanding of current disease status and treatment options and is in agreement with the current care plan. All questions were answered. The patient knows to call the clinic with any problems, questions or concerns. We can certainly see the patient much sooner if necessary.  Disclaimer: This note was dictated with voice recognition software. Similar sounding words can  inadvertently be transcribed and may not be corrected upon review.       

## 2019-12-31 ENCOUNTER — Other Ambulatory Visit: Payer: Self-pay

## 2020-01-03 ENCOUNTER — Other Ambulatory Visit: Payer: Self-pay | Admitting: *Deleted

## 2020-01-03 MED ORDER — ANORO ELLIPTA 62.5-25 MCG/INH IN AEPB: 1.0000 | INHALATION_SPRAY | Freq: Every day | RESPIRATORY_TRACT | 1 refills | Status: DC

## 2020-02-01 ENCOUNTER — Telehealth: Payer: Self-pay | Admitting: Pulmonary Disease

## 2020-02-01 MED ORDER — ANORO ELLIPTA 62.5-25 MCG/INH IN AEPB: 1.0000 | INHALATION_SPRAY | Freq: Every day | RESPIRATORY_TRACT | 2 refills | Status: DC

## 2020-02-01 NOTE — Telephone Encounter (Signed)
Spoke with pt's wife and advised that Anoro refills were sent to [facility] [address]. Nothing further needed at this time.

## 2020-03-20 ENCOUNTER — Encounter: Payer: Self-pay | Admitting: Pulmonary Disease

## 2020-03-22 ENCOUNTER — Telehealth: Payer: Self-pay | Admitting: Pulmonary Disease

## 2020-03-22 MED ORDER — DOXYCYCLINE HYCLATE 100 MG PO TABS
100.0000 mg | ORAL_TABLET | Freq: Two times a day (BID) | ORAL | 0 refills | Status: DC
Start: 2020-03-22 — End: 2020-06-21

## 2020-03-22 MED ORDER — PREDNISONE 20 MG PO TABS: 40.0000 mg | ORAL_TABLET | Freq: Every day | ORAL | 0 refills | Status: DC

## 2020-03-22 NOTE — Telephone Encounter (Signed)
Spoke with the pt  He states that he has been noticing some increased SOB and difficulty with taking a full, deep breath x 3 months  He states this happens when he gets hot and with moderate exertion  He states that over the w/e he felt bad in general and had low grade temp- 99.3- none since then  Denies any body aches, CP, cough, wheezing  He has had both covid vaccines as of May 2021  He has appt with Eustaquio Maize 03/27/20  I offered sooner appt and he states feels okay to wait until 03/27/20  Will forward to Dr Valeta Harms to let him know what's going on and see if has any recs, thanks

## 2020-03-22 NOTE — Telephone Encounter (Signed)
PCCM:  Doxycycline 100mg  BID X 7 days  Prednisone 40mg  daily X 5 days  See beth on 30th   Thanks  Garner Nash, DO [facility] 03/22/2020 2:44 PM

## 2020-03-22 NOTE — Telephone Encounter (Signed)
Called and spoke with pt letting him know that [facility] wants [place] to send doxy abx and pred Rx to pharmacy for him and he verbalized understanding. Verified pt's preferred pharmacy and sent both meds for pt. Nothing further needed.

## 2020-03-27 ENCOUNTER — Ambulatory Visit (INDEPENDENT_AMBULATORY_CARE_PROVIDER_SITE_OTHER): Payer: BC Managed Care – PPO | Admitting: Primary Care

## 2020-03-27 ENCOUNTER — Other Ambulatory Visit: Payer: Self-pay

## 2020-03-27 ENCOUNTER — Encounter: Payer: Self-pay | Admitting: Primary Care

## 2020-03-27 VITALS — BP 126/72 | HR 88 | Temp 97.3°F | Wt 141.0 lb

## 2020-03-27 DIAGNOSIS — J449 Chronic obstructive pulmonary disease, unspecified: Secondary | ICD-10-CM | POA: Diagnosis not present

## 2020-03-27 NOTE — Patient Instructions (Addendum)
I heard very faint rales left base of lung, this could be from something called atelectasis. Recommend taking deep breaths by using your incentive spirometer.   Recommendations: - Continue Anoro 1 puff daily - If not already taking try Flonase nasal spray for allergies/sinusitis  - Use incentive spirometry 5-10 breaths x3/day  - Ordered lab work to be completed in 1 week off prednisone  - If not better after completing doxycycline recommend checking CXR and COVID  Follow-up: - Call us if you do not see any improvement in 5-7 days

## 2020-03-27 NOTE — Assessment & Plan Note (Addendum)
-   Patient recently treated for suspected AECOPD with Doxycycline/Pred taper. Patient has 2-3 days left of abx. On exam patient had faint rales left base of lung, this could be from atelectasis. Recommend patient use IS 5-10 breaths x3/day.   Plan: - Continue Anoro 1 puff daily.  - Start Flonase nasal spray for allergies/sinusitis.  - Ordered lab work to be completed in 1 week off prednisone  - Patient has been vaccinated for [place]. No know exposure. If not better after completing doxycycline recommend checking CXR and COVID-19 testing. - Follow-up: If no improvement in 5-7 days or symptoms worsen

## 2020-03-27 NOTE — Progress Notes (Signed)
@Patient  ID: Sean Rose, male    DOB: 1955/08/12, 64 y.o.   MRN: [id number]  Chief Complaint  Patient presents with  . Follow-up    reports improvement with dyspnea since started abx and predinsone    Referring provider: [name], NP  HPI: 64 year old male, former smoker quit in March 2020 (38-pack-year history).  Past medical history significant for right upper lung nodule, adenocarcinoma right lung stage 3 s/pt VATS February 2020.  Patient of Dr. Valeta Harms, last seen January 2020.    03/27/2020 Patient presents today for regular office follow-up for pulmonary nodules. He called our office on 03/22/20 with reports of increased shortness of breath and difficulty taking deep breath. He had associated low grade fever and headache. He is fully vaccinated. He does work outside and is fairly active. He is prone to allergy symptoms and sinusitis. He complete course of doxycyline 1 week and prednisone x 5 days. He is feeling some better today, still runs out of air when speaking. This is not new since VATS procedure. Finished chemotherapy in July 2020.  He saw Dr. Earlie Server for routine follow-up in May 2021. CT chest in May 2021 showed no new or progressive findings to suggest recurrent or metastatic disease.  Centrilobular and paraseptal emphysema.  5 mm left upper lobe nodule (previously 6 mm).  No suspicious pulmonary nodules or masses.  No focal airspace consolidation or pleural effusion.   Allergies  Allergen Reactions  . Tyloxapol Nausea Only    Immunization History  Administered Date(s) Administered  . PFIZER SARS-COV-2 Vaccination 10/05/2019, 11/02/2019    Past Medical History:  Diagnosis Date  . Anginal pain ([place])   . CAD (coronary artery disease)   . CAP (community acquired pneumonia) 09/2016  . COPD (chronic obstructive pulmonary disease) ([place])   . GERD (gastroesophageal reflux disease)   . Heart murmur    "I was told I had one when I was a kid"  . Hemorrhoids   .  History of anal fissures    "no surgeries" (10/30/2016)  . Hyperlipidemia   . Hypertension   . lung ca dx'd 08/2018  . Peripheral arterial disease ([place])    status post right common iliac artery stenting back in 2007  . Seasonal allergies   . Tobacco abuse     Tobacco History: Social History   Tobacco Use  Smoking Status Former Smoker  . Packs/day: 0.25  . Years: 38.00  . Pack years: 9.50  . Types: Cigarettes  . Quit date: 10/19/2018  . Years since quitting: 1.4  Smokeless Tobacco Never Used  Tobacco Comment   pack per day 12.17.19   Counseling given: Not Answered Comment: pack per day 12.17.19   Outpatient Medications Prior to Visit  Medication Sig Dispense Refill  . amLODipine (NORVASC) 10 MG tablet TAKE 1 TABLET(10 MG) BY MOUTH DAILY 90 tablet 1  . aspirin EC 81 MG tablet Take 81 mg by mouth daily.    . clopidogrel (PLAVIX) 75 MG tablet TAKE 1 TABLET BY MOUTH EVERY DAY 90 tablet 1  . diphenhydrAMINE (BENADRYL) 25 mg capsule Take 25 mg by mouth daily as needed for allergies.     Marland Kitchen doxycycline (VIBRA-TABS) 100 MG tablet Take 1 tablet (100 mg total) by mouth 2 (two) times daily. 14 tablet 0  . ibuprofen (ADVIL,MOTRIN) 200 MG tablet Take 400 mg by mouth every 6 (six) hours as needed for headache or moderate pain.     Marland Kitchen MAGNESIUM-OXIDE 400 (241.3 Mg) MG tablet TAKE  1 TABLET BY MOUTH THREE TIMES DAILY 90 tablet 1  . metoprolol succinate (TOPROL-XL) 100 MG 24 hr tablet TAKE 1 TABLET BY MOUTH EVERY DAY 90 tablet 2  . Multiple Vitamin (MULTIVITAMIN WITH MINERALS) TABS tablet Take 1 tablet by mouth daily.    . nitroGLYCERIN (NITROSTAT) 0.4 MG SL tablet PLACE 1 TABLET UNDER THE TONGUE EVERY 5 MINUTES AS NEEDED FOR CHEST PAIN 25 tablet 6  . umeclidinium-vilanterol (ANORO ELLIPTA) 62.5-25 MCG/INH AEPB Inhale 1 puff into the lungs daily. 60 each 2  . predniSONE (DELTASONE) 20 MG tablet Take 2 tablets (40 mg total) by mouth daily with breakfast. 10 tablet 0   No facility-administered  medications prior to visit.    Review of Systems  Review of Systems  Constitutional:       Low grade fever  Respiratory: Positive for shortness of breath.   Neurological: Positive for headaches.    Physical Exam  BP 126/72   Pulse 88   Temp (!) 97.3 F (36.3 C)   Wt 141 lb (64 kg)   HC 68" (172.7 cm)   SpO2 100%   BMI 21.44 kg/m  Physical Exam Constitutional:      General: He is not in acute distress.    Appearance: Normal appearance. He is not ill-appearing.  Cardiovascular:     Rate and Rhythm: Normal rate and regular rhythm.  Pulmonary:     Breath sounds: No wheezing or rhonchi.     Comments: Diminished right base, scattered rales left base  Skin:    General: Skin is warm and dry.  Neurological:     General: No focal deficit present.     Mental Status: He is alert and oriented to person, place, and time. Mental status is at baseline.  Psychiatric:        Mood and Affect: Mood normal.        Behavior: Behavior normal.        Thought Content: Thought content normal.        Judgment: Judgment normal.      Lab Results:  CBC    Component Value Date/Time   WBC 7.8 12/17/2019 1158   WBC 8.8 06/08/2019 1146   RBC 4.13 (L) 12/17/2019 1158   HGB 14.5 12/17/2019 1158   HCT 40.5 12/17/2019 1158   PLT 285 12/17/2019 1158   MCV 98.1 12/17/2019 1158   MCH 35.1 (H) 12/17/2019 1158   MCHC 35.8 12/17/2019 1158   RDW 12.5 12/17/2019 1158   LYMPHSABS 2.7 12/17/2019 1158   MONOABS 1.0 12/17/2019 1158   EOSABS 0.3 12/17/2019 1158   BASOSABS 0.1 12/17/2019 1158    BMET    Component Value Date/Time   NA 130 (L) 12/17/2019 1158   NA 130 (L) 03/05/2017 1110   K 4.3 12/17/2019 1158   CL 96 (L) 12/17/2019 1158   CO2 27 12/17/2019 1158   GLUCOSE 105 (H) 12/17/2019 1158   BUN 5 (L) 12/17/2019 1158   BUN 4 (L) 03/05/2017 1110   CREATININE 0.84 12/17/2019 1158   CREATININE 0.58 (L) 05/22/2015 1041   CALCIUM 9.4 12/17/2019 1158   GFRNONAA >60 12/17/2019 1158   GFRAA  >60 12/17/2019 1158    BNP No results found for: BNP  ProBNP No results found for: PROBNP  Imaging: No results found.   Assessment & Plan:   Stage 1 mild COPD by GOLD classification ([place]) - Patient recently treated for suspected AECOPD with Doxycycline/Pred taper. Patient has 2-3 days left of abx. On  exam patient had faint rales left base of lung, this could be from atelectasis. Recommend patient use IS 5-10 breaths x3/day.   Plan: - Continue Anoro 1 puff daily.  - Start Flonase nasal spray for allergies/sinusitis.  - Ordered lab work to be completed in 1 week off prednisone  - Patient has been vaccinated for [place]. No know exposure. If not better after completing doxycycline recommend checking CXR and COVID-19 testing. - Follow-up: If no improvement in 5-7 days or symptoms worsen     Martyn Ehrich, NP 03/27/2020

## 2020-03-28 ENCOUNTER — Other Ambulatory Visit: Payer: Self-pay | Admitting: Physician Assistant

## 2020-03-28 ENCOUNTER — Other Ambulatory Visit: Payer: Self-pay | Admitting: Cardiovascular Disease

## 2020-03-29 ENCOUNTER — Ambulatory Visit: Payer: BC Managed Care – PPO | Admitting: Pulmonary Disease

## 2020-04-20 ENCOUNTER — Telehealth: Payer: Self-pay | Admitting: *Deleted

## 2020-04-20 NOTE — Telephone Encounter (Signed)
A message was left, re: his follow up visit. 

## 2020-04-26 ENCOUNTER — Other Ambulatory Visit: Payer: Self-pay

## 2020-04-26 ENCOUNTER — Ambulatory Visit (HOSPITAL_COMMUNITY)
Admission: EM | Admit: 2020-04-26 | Discharge: 2020-04-26 | Discharge status: Against Medical Advice | Payer: BC Managed Care – PPO

## 2020-04-28 ENCOUNTER — Encounter: Payer: Self-pay | Admitting: Family Medicine

## 2020-04-28 ENCOUNTER — Ambulatory Visit (INDEPENDENT_AMBULATORY_CARE_PROVIDER_SITE_OTHER): Payer: BC Managed Care – PPO | Admitting: Family Medicine

## 2020-04-28 ENCOUNTER — Other Ambulatory Visit: Payer: Self-pay

## 2020-04-28 VITALS — BP 112/68 | HR 67 | Temp 98.1°F | Ht 68.0 in | Wt 142.1 lb

## 2020-04-28 DIAGNOSIS — C3491 Malignant neoplasm of unspecified part of right bronchus or lung: Secondary | ICD-10-CM | POA: Diagnosis not present

## 2020-04-28 DIAGNOSIS — Z8639 Personal history of other endocrine, nutritional and metabolic disease: Secondary | ICD-10-CM | POA: Diagnosis not present

## 2020-04-28 DIAGNOSIS — R202 Paresthesia of skin: Secondary | ICD-10-CM | POA: Diagnosis not present

## 2020-04-28 DIAGNOSIS — R5383 Other fatigue: Secondary | ICD-10-CM

## 2020-04-28 NOTE — Progress Notes (Signed)
Established Patient Office Visit  Subjective:  Patient ID: Sean Rose, male    DOB: Sep 15, 1955  Age: 64 y.o. MRN: [id number]  CC:  Chief Complaint  Patient presents with  . Fatigue    very tired, sleeps well usually, had lung cancer and felt this way then, going on for a few months    HPI LLOYDE Rose presents for complaints of increasing fatigue for the past several weeks.  He has history of stage III adenocarcinoma of the right lung.  Has had previous surgery for that and also chemotherapy and gets every 72-monthfollow-up for scanning.  He states he is sleeping generally very well.  He has history of CAD but no recent chest pains.  His other problems include history of peripheral vascular disease, hypertension, GERD.  Denies any recent major appetite or weight changes.  He does have paresthesias involving upper extremities frequently at night.  He sleeps on his back mostly usually these are very transient and improves with position change.  He states he has had severe low potassium and magnesium in the past.  No recent muscle cramps.  He does take beta-blocker with metoprolol but has been on this for many years so this is unlikely causing his recent fatigue.  No increased dyspnea above baseline.  No recent chest pains.  Past Medical History:  Diagnosis Date  . [place] ([place]   . CAD (coronary artery disease)   . CAP (community acquired pneumonia) 09/2016  . COPD (chronic obstructive pulmonary disease) ([place]   . GERD (gastroesophageal reflux disease)   . Heart murmur    "I was told I had one when I was a kid"  . Hemorrhoids   . History of anal fissures    "no surgeries" (10/30/2016)  . Hyperlipidemia   . Hypertension   . lung ca dx'd 08/2018  . [place] disease [place]    status post right common iliac artery stenting back in 2007  . Seasonal allergies   . Tobacco abuse     Past Surgical History:  Procedure Laterality Date  . ANKLE SURGERY Left    "rebuilt  it"  . ANTERIOR CRUCIATE LIGAMENT REPAIR Right   . CARDIAC CATHETERIZATION  09/23/2008   Continued medical therapy - may need GI evaluation in addition.  .Marland KitchenCARDIAC CATHETERIZATION  10/28/2007   Medical therapy recommended.  .Marland KitchenCARDIAC CATHETERIZATION  11/18/2006   In-stent restenosis RCA  (50% distal edge, 80% segmental mid, and 50-60% segmental proximal). Successful cutting balloon atherectomy using a [id number] cutting balloon. 3 inflations with atherectomy performed on mid and proximal portions resulting in reduction of 80% mid in-stent restenosis to less than 20% residual and 50-60% segmental proximal to less than 20% residual without dissection.  .Marland KitchenCARDIAC CATHETERIZATION  02/26/2006   Severe stenosis in RCA. Stenting performed using IVUS. 3.5x20 Maverick balloon deployed at 6[place] Distal stent-a 4x28 Liberte stent-deployed 12atm 48sec, 12atm 31sec, 4atm 19sec. Mid stent-a 4x28 Liberte stent-deployed 14atm 45sec, 14atm 60sec, 14atm 44sec. Proximal stent-4x8 Liberte- 14atm 45sec,14atm 47sec, 16atm 43sec. Severely diseased segment then appeared TIMI-3 flow.  .Marland KitchenCARDIOVASCULAR STRESS TEST  11/17/2012   No significant ECG changes. Septal perfusion defect is new when complared to study from 2010. Abnormal myocardial perfusion imaging with a basal to mid perfusion suggestive of previous MI.  .Marland KitchenCAROTID DOPPLER  08/09/2011   Bilateral Bulb/Proximal ICA - demonstrated a mild amount of fibrous plaque without evidence of significant diameter reduction reduction or other vascular abnormality.  .Marland Kitchen  CHEST TUBE INSERTION Right 09/02/2018   Procedure: Chest Tube Insertion;  Surgeon: Icard, Bradley L, DO;  Location: [facility] OR;  Service: Thoracic;  Laterality: Right;  . COLONOSCOPY     2003, 2014  . CORONARY ANGIOPLASTY WITH STENT PLACEMENT    . FEMORAL ARTERY STENT    . INGUINAL HERNIA REPAIR Right   . KNEE ARTHROSCOPY Right "multiple"  . LAPAROSCOPIC APPENDECTOMY N/A 12/07/2018   Procedure: APPENDECTOMY LAPAROSCOPIC;   Surgeon: Blackman, Douglas, MD;  Location: [facility];  Service: General;  Laterality: N/A;  . LOWER EXTREMITY ARTERIAL DOPPLER  01/31/2011   Bilateral ABIs-normal values with no suggestion of arterial insuff to the lower extremities at rest. Right CIA stent-mild amount of nonhemodynamically significant plaque is noted throughout  . 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 PERCUTANEOUS STENT INTERVENTION  04/04/2006 & 04/13/2015   a. Right common iliac artery with an 8.0x18 mm Herculink stent deployed at 12 atm. Stenosis was reduced from 80% to 0% with brisk flow. b. I-cast stenting to left common iliac artery  . PERIPHERAL VASCULAR CATHETERIZATION N/A 04/13/2015   Procedure: Lower Extremity Angiography;  Surgeon: Jonathan J Berry, MD; L-oCIA 75%, 40-50% L-EIA, R-CIA stent patent, s/p 8 mm x 38 mm ICast covered stent>>0% stenosis in L-oCIA     . SHOULDER ARTHROSCOPY WITH ROTATOR CUFF REPAIR Right   . TRANSTHORACIC ECHOCARDIOGRAM  11/26/2012   EF not noted. Aortic valve-sclerosis without stenosis, no regurgiation.   . UPPER GASTROINTESTINAL ENDOSCOPY    . US CAROTID DOPPLER BILATERAL ([facility] HX)  08/09/2011   Bilateral Bulb/Proximal ICAa demonstrated a mild amount of fibrous plaque without evidence of significant diameter reduction or any other vascular abnormality.  . VIDEO ASSISTED THORACOSCOPY (VATS)/ LOBECTOMY Right 09/14/2018   Procedure: RIGHT VIDEO ASSISTED THORACOSCOPY (VATS)/ RIGHT UPPER LOBECTOMY;  Surgeon: Hendrickson, Steven C, MD;  Location: [facility];  Service: Thoracic;  Laterality: Right;  . VIDEO BRONCHOSCOPY WITH ENDOBRONCHIAL NAVIGATION N/A 09/02/2018   Procedure: VIDEO BRONCHOSCOPY WITH ENDOBRONCHIAL NAVIGATION;  Surgeon: Icard, Bradley L, DO;  Location: [facility];  Service: Thoracic;  Laterality: N/A;    Family History  Problem Relation Age of Onset  . Colon cancer Mother   . Heart disease Father   . Heart disease Paternal Grandfather     Social History   Socioeconomic History  .  Marital status: Married    Spouse name: Not on file  . Number of children: Not on file  . Years of education: Not on file  . Highest education level: Not on file  Occupational History  . Not on file  Tobacco Use  . Smoking status: Former Smoker    Packs/day: 0.25    Years: 38.00    Pack years: 9.50    Types: Cigarettes    Quit date: 10/19/2018    Years since quitting: 1.5  . Smokeless tobacco: Never Used  . Tobacco comment: pack per day 12.17.19  Vaping Use  . Vaping Use: Former  Substance and Sexual Activity  . Alcohol use: Yes    Comment: socially  . Drug use: No  . Sexual activity: Yes  Other Topics Concern  . Not on file  Social History Narrative  . Not on file   Social Determinants of Health   Financial Resource Strain:   . Difficulty of Paying Living Expenses: Not on file  Food Insecurity:   . Worried About Running Out of Food in the Last Year: Not on file  . Ran Out of Food   in the Last Year: Not on file  Transportation Needs:   . Lack of Transportation (Medical): Not on file  . Lack of Transportation (Non-Medical): Not on file  Physical Activity:   . Days of Exercise per Week: Not on file  . Minutes of Exercise per Session: Not on file  Stress:   . Feeling of Stress : Not on file  Social Connections:   . Frequency of Communication with Friends and Family: Not on file  . Frequency of Social Gatherings with Friends and Family: Not on file  . Attends Religious Services: Not on file  . Active Member of Clubs or Organizations: Not on file  . Attends Club or Organization Meetings: Not on file  . Marital Status: Not on file  Intimate Partner Violence:   . Fear of Current or Ex-Partner: Not on file  . Emotionally Abused: Not on file  . Physically Abused: Not on file  . Sexually Abused: Not on file    Outpatient Medications Prior to Visit  Medication Sig Dispense Refill  . amLODipine (NORVASC) 10 MG tablet TAKE 1 TABLET(10 MG) BY MOUTH DAILY 90 tablet 1  .  aspirin EC 81 MG tablet Take 81 mg by mouth daily.    . clopidogrel (PLAVIX) 75 MG tablet TAKE 1 TABLET BY MOUTH EVERY DAY 90 tablet 1  . diphenhydrAMINE (BENADRYL) 25 mg capsule Take 25 mg by mouth daily as needed for allergies.     . ibuprofen (ADVIL,MOTRIN) 200 MG tablet Take 400 mg by mouth every 6 (six) hours as needed for headache or moderate pain.     . MAGNESIUM-OXIDE 400 (241.3 Mg) MG tablet TAKE 1 TABLET BY MOUTH THREE TIMES DAILY 90 tablet 1  . metoprolol succinate (TOPROL-XL) 100 MG 24 hr tablet TAKE 1 TABLET BY MOUTH EVERY DAY 90 tablet 2  . Multiple Vitamin (MULTIVITAMIN WITH MINERALS) TABS tablet Take 1 tablet by mouth daily.    . nitroGLYCERIN (NITROSTAT) 0.4 MG SL tablet PLACE 1 TABLET UNDER THE TONGUE EVERY 5 MINUTES AS NEEDED FOR CHEST PAIN 25 tablet 6  . umeclidinium-vilanterol (ANORO ELLIPTA) 62.5-25 MCG/INH AEPB Inhale 1 puff into the lungs daily. 60 each 2  . doxycycline (VIBRA-TABS) 100 MG tablet Take 1 tablet (100 mg total) by mouth 2 (two) times daily. (Patient not taking: Reported on 04/28/2020) 14 tablet 0   No facility-administered medications prior to visit.    Allergies  Allergen Reactions  . Tyloxapol Nausea Only    ROS Review of Systems  Constitutional: Positive for fatigue. Negative for chills, fever and unexpected weight change.  Respiratory: Negative for cough, choking and shortness of breath.   Cardiovascular: Negative for chest pain, palpitations and leg swelling.  Gastrointestinal: Negative for abdominal pain, blood in stool, diarrhea, nausea and vomiting.  Genitourinary: Negative for dysuria.  Neurological: Negative for headaches.  Hematological: Negative for adenopathy.      Objective:    Physical Exam Vitals reviewed.  Constitutional:      Appearance: Normal appearance.  Cardiovascular:     Rate and Rhythm: Normal rate and regular rhythm.  Pulmonary:     Effort: Pulmonary effort is normal.     Breath sounds: Normal breath sounds.    Abdominal:     Palpations: Abdomen is soft.     Tenderness: There is no abdominal tenderness.  Musculoskeletal:     Cervical back: Neck supple.     Right lower leg: No edema.     Left lower leg: No edema.  Lymphadenopathy:       Cervical: No cervical adenopathy.  Neurological:     Mental Status: He is alert.     BP 112/68   Pulse 67   Temp 98.1 F (36.7 C) (Oral)   Ht 5' 8" (1.727 m)   Wt 142 lb 1.6 oz (64.5 kg)   SpO2 98%   BMI 21.61 kg/m  Wt Readings from Last 3 Encounters:  04/28/20 142 lb 1.6 oz (64.5 kg)  03/27/20 141 lb (64 kg)  12/20/19 144 lb 4.8 oz (65.5 kg)     Health Maintenance Due  Topic Date Due  . Hepatitis C Screening  Never done  . TETANUS/TDAP  Never done  . INFLUENZA VACCINE  Never done    There are no preventive care reminders to display for this patient.  Lab Results  Component Value Date   TSH 1.050 03/30/2019   Lab Results  Component Value Date   WBC 7.8 12/17/2019   HGB 14.5 12/17/2019   HCT 40.5 12/17/2019   MCV 98.1 12/17/2019   PLT 285 12/17/2019   Lab Results  Component Value Date   NA 130 (L) 12/17/2019   K 4.3 12/17/2019   CO2 27 12/17/2019   GLUCOSE 105 (H) 12/17/2019   BUN 5 (L) 12/17/2019   CREATININE 0.84 12/17/2019   BILITOT 0.7 12/17/2019   ALKPHOS 85 12/17/2019   AST 35 12/17/2019   ALT 24 12/17/2019   PROT 7.2 12/17/2019   ALBUMIN 4.0 12/17/2019   CALCIUM 9.4 12/17/2019   ANIONGAP 7 12/17/2019   GFR 117.93 08/31/2018   Lab Results  Component Value Date   CHOL 162 03/05/2017   Lab Results  Component Value Date   HDL 59 03/05/2017   Lab Results  Component Value Date   LDLCALC 73 03/05/2017   Lab Results  Component Value Date   TRIG 149 03/05/2017   Lab Results  Component Value Date   CHOLHDL 2.7 03/05/2017   No results found for: HGBA1C    Assessment & Plan:   Problem List Items Addressed This Visit      Unprioritized   Adenocarcinoma of right lung, stage 3 ([place])    Other Visit  Diagnoses    Fatigue, unspecified type    -  Primary   Relevant Orders   CBC with Differential/Platelet   CMP   TSH   Magnesium   Paresthesia       Relevant Orders   Vitamin B12   History of hypokalemia        Patient presents with chief complaint of increased fatigue past several months.  No clinical suspicion for obstructive sleep apnea.  Feels he is sleeping fairly well.  No depression issues.  No red flags such as major weight change or appetite loss.  History of electrolyte disturbance as above.  He has some paresthesias upper extremities that sound to be more likely positional  -Recommend check further labs with CMP, CBC, TSH, B12, magnesium level  No orders of the defined types were placed in this encounter.   Follow-up: No follow-ups on file.    Carolann Littler, MD

## 2020-04-28 NOTE — Addendum Note (Signed)
Addended by: Marrion Coy on: 04/28/2020 08:26 AM   Modules accepted: Orders

## 2020-04-28 NOTE — Patient Instructions (Signed)

## 2020-04-29 LAB — CBC WITH DIFFERENTIAL/PLATELET
Absolute Monocytes: 927 cells/uL (ref 200–950)
Basophils Absolute: 49 cells/uL (ref 0–200)
Basophils Relative: 0.8 %
Eosinophils Absolute: 329 cells/uL (ref 15–500)
Eosinophils Relative: 5.4 %
HCT: 43.8 % (ref 38.5–50.0)
Hemoglobin: 15.3 g/dL (ref 13.2–17.1)
Lymphs Abs: 1873 cells/uL (ref 850–3900)
MCH: 35.4 pg — ABNORMAL HIGH (ref 27.0–33.0)
MCHC: 34.9 g/dL (ref 32.0–36.0)
MCV: 101.4 fL — ABNORMAL HIGH (ref 80.0–100.0)
MPV: 8.2 fL (ref 7.5–12.5)
Monocytes Relative: 15.2 %
Neutro Abs: 2922 cells/uL (ref 1500–7800)
Neutrophils Relative %: 47.9 %
Platelets: 370 10*3/uL (ref 140–400)
RBC: 4.32 10*6/uL (ref 4.20–5.80)
RDW: 12.4 % (ref 11.0–15.0)
Total Lymphocyte: 30.7 %
WBC: 6.1 10*3/uL (ref 3.8–10.8)

## 2020-04-29 LAB — COMPREHENSIVE METABOLIC PANEL
AG Ratio: 1.5 (calc) (ref 1.0–2.5)
ALT: 23 U/L (ref 9–46)
AST: 42 U/L — ABNORMAL HIGH (ref 10–35)
Albumin: 4.2 g/dL (ref 3.6–5.1)
Alkaline phosphatase (APISO): 95 U/L (ref 35–144)
BUN/Creatinine Ratio: 5 (calc) — ABNORMAL LOW (ref 6–22)
BUN: 4 mg/dL — ABNORMAL LOW (ref 7–25)
CO2: 26 mmol/L (ref 20–32)
Calcium: 9.6 mg/dL (ref 8.6–10.3)
Chloride: 94 mmol/L — ABNORMAL LOW (ref 98–110)
Creat: 0.76 mg/dL (ref 0.70–1.25)
Globulin: 2.8 g/dL (calc) (ref 1.9–3.7)
Glucose, Bld: 101 mg/dL — ABNORMAL HIGH (ref 65–99)
Potassium: 5.2 mmol/L (ref 3.5–5.3)
Sodium: 130 mmol/L — ABNORMAL LOW (ref 135–146)
Total Bilirubin: 0.9 mg/dL (ref 0.2–1.2)
Total Protein: 7 g/dL (ref 6.1–8.1)

## 2020-04-29 LAB — MAGNESIUM: Magnesium: 1.9 mg/dL (ref 1.5–2.5)

## 2020-04-29 LAB — VITAMIN B12: Vitamin B-12: 473 pg/mL (ref 200–1100)

## 2020-04-29 LAB — TSH: TSH: 1.27 mIU/L (ref 0.40–4.50)

## 2020-05-04 ENCOUNTER — Telehealth: Payer: Self-pay | Admitting: Cardiovascular Disease

## 2020-05-04 NOTE — Telephone Encounter (Signed)
lvm for patient to return call to get follow up scheduled with Gwenlyn Found from recall list

## 2020-05-26 ENCOUNTER — Telehealth: Payer: Self-pay | Admitting: Pulmonary Disease

## 2020-05-26 MED ORDER — ANORO ELLIPTA 62.5-25 MCG/INH IN AEPB: 1.0000 | INHALATION_SPRAY | Freq: Every day | RESPIRATORY_TRACT | 5 refills | Status: DC

## 2020-05-26 NOTE — Telephone Encounter (Signed)
Called patient but he did not answer. Left a detailed message for patient stating that I would send the Anoro to the pharmacy for him. RX has been sent. Will close encounter.

## 2020-06-19 ENCOUNTER — Ambulatory Visit (HOSPITAL_COMMUNITY)
Admission: RE | Admit: 2020-06-19 | Discharge: 2020-06-19 | Disposition: A | Discharge status: Home / Self Care | Payer: BC Managed Care – PPO | Source: Ambulatory Visit | Attending: Internal Medicine | Admitting: Internal Medicine

## 2020-06-19 ENCOUNTER — Inpatient Hospital Stay: Payer: BC Managed Care – PPO | Attending: Internal Medicine

## 2020-06-19 ENCOUNTER — Other Ambulatory Visit: Payer: Self-pay

## 2020-06-19 DIAGNOSIS — C3490 Malignant neoplasm of unspecified part of unspecified bronchus or lung: Secondary | ICD-10-CM | POA: Diagnosis not present

## 2020-06-19 DIAGNOSIS — Z85118 Personal history of other malignant neoplasm of bronchus and lung: Secondary | ICD-10-CM | POA: Insufficient documentation

## 2020-06-19 DIAGNOSIS — J432 Centrilobular emphysema: Secondary | ICD-10-CM | POA: Diagnosis not present

## 2020-06-19 DIAGNOSIS — J984 Other disorders of lung: Secondary | ICD-10-CM | POA: Diagnosis not present

## 2020-06-19 DIAGNOSIS — Z902 Acquired absence of lung [part of]: Secondary | ICD-10-CM | POA: Diagnosis not present

## 2020-06-19 DIAGNOSIS — Z9221 Personal history of antineoplastic chemotherapy: Secondary | ICD-10-CM | POA: Insufficient documentation

## 2020-06-19 DIAGNOSIS — Z8701 Personal history of pneumonia (recurrent): Secondary | ICD-10-CM | POA: Diagnosis not present

## 2020-06-19 DIAGNOSIS — I2510 Atherosclerotic heart disease of native coronary artery without angina pectoris: Secondary | ICD-10-CM | POA: Diagnosis not present

## 2020-06-19 LAB — CMP (CANCER CENTER ONLY)
ALT: 22 U/L (ref 0–44)
AST: 41 U/L (ref 15–41)
Albumin: 4.2 g/dL (ref 3.5–5.0)
Alkaline Phosphatase: 76 U/L (ref 38–126)
Anion gap: 10 (ref 5–15)
BUN: <4 mg/dL — ABNORMAL LOW (ref 8–23)
CO2: 23 mmol/L (ref 22–32)
Calcium: 9.8 mg/dL (ref 8.9–10.3)
Chloride: 97 mmol/L — ABNORMAL LOW (ref 98–111)
Creatinine: 0.83 mg/dL (ref 0.61–1.24)
GFR, Estimated: >60 mL/min (ref >60)
Glucose, Bld: 95 mg/dL (ref 70–99)
Potassium: 4.6 mmol/L (ref 3.5–5.1)
Sodium: 130 mmol/L — ABNORMAL LOW (ref 135–145)
Total Bilirubin: 0.6 mg/dL (ref 0.3–1.2)
Total Protein: 8 g/dL (ref 6.5–8.1)

## 2020-06-19 LAB — CBC WITH DIFFERENTIAL (CANCER CENTER ONLY)
Abs Immature Granulocytes: 0.02 10*3/uL (ref 0.00–0.07)
Basophils Absolute: 0.1 10*3/uL (ref 0.0–0.1)
Basophils Relative: 1 %
Eosinophils Absolute: 0.4 10*3/uL (ref 0.0–0.5)
Eosinophils Relative: 5 %
HCT: 43.2 % (ref 39.0–52.0)
Hemoglobin: 15.6 g/dL (ref 13.0–17.0)
Immature Granulocytes: 0 %
Lymphocytes Relative: 36 %
Lymphs Abs: 2.6 10*3/uL (ref 0.7–4.0)
MCH: 34.7 pg — ABNORMAL HIGH (ref 26.0–34.0)
MCHC: 36.1 g/dL — ABNORMAL HIGH (ref 30.0–36.0)
MCV: 96 fL (ref 80.0–100.0)
Monocytes Absolute: 0.8 10*3/uL (ref 0.1–1.0)
Monocytes Relative: 11 %
Neutro Abs: 3.5 10*3/uL (ref 1.7–7.7)
Neutrophils Relative %: 47 %
Platelet Count: 341 10*3/uL (ref 150–400)
RBC: 4.5 MIL/uL (ref 4.22–5.81)
RDW: 12.1 % (ref 11.5–15.5)
WBC Count: 7.4 10*3/uL (ref 4.0–10.5)
nRBC: 0 % (ref 0.0–0.2)

## 2020-06-19 LAB — MAGNESIUM: Magnesium: 1.8 mg/dL (ref 1.7–2.4)

## 2020-06-19 MED ORDER — IOHEXOL 300 MG/ML  SOLN
75.0000 mL | Freq: Once | INTRAMUSCULAR | Status: AC | PRN
  Administered 2020-06-19: 75 mL via INTRAVENOUS

## 2020-06-21 ENCOUNTER — Other Ambulatory Visit: Payer: Self-pay

## 2020-06-21 ENCOUNTER — Inpatient Hospital Stay (HOSPITAL_BASED_OUTPATIENT_CLINIC_OR_DEPARTMENT_OTHER): Payer: BC Managed Care – PPO | Admitting: Internal Medicine

## 2020-06-21 ENCOUNTER — Encounter: Payer: Self-pay | Admitting: Internal Medicine

## 2020-06-21 VITALS — BP 142/83 | HR 66 | Temp 98.0°F | Resp 18 | Ht 68.0 in | Wt 145.6 lb

## 2020-06-21 DIAGNOSIS — C3490 Malignant neoplasm of unspecified part of unspecified bronchus or lung: Secondary | ICD-10-CM

## 2020-06-21 DIAGNOSIS — Z9221 Personal history of antineoplastic chemotherapy: Secondary | ICD-10-CM | POA: Diagnosis not present

## 2020-06-21 DIAGNOSIS — Z85118 Personal history of other malignant neoplasm of bronchus and lung: Secondary | ICD-10-CM | POA: Diagnosis not present

## 2020-06-21 DIAGNOSIS — C3491 Malignant neoplasm of unspecified part of right bronchus or lung: Secondary | ICD-10-CM

## 2020-06-21 DIAGNOSIS — Z902 Acquired absence of lung [part of]: Secondary | ICD-10-CM | POA: Diagnosis not present

## 2020-06-21 DIAGNOSIS — Z8701 Personal history of pneumonia (recurrent): Secondary | ICD-10-CM | POA: Diagnosis not present

## 2020-06-21 DIAGNOSIS — I10 Essential (primary) hypertension: Secondary | ICD-10-CM | POA: Diagnosis not present

## 2020-06-21 NOTE — Progress Notes (Signed)
[facility] [phone]   [phone]  OFFICE PROGRESS NOTE  [name], NP [place] [address]  DIAGNOSIS: Stage IIIa (T3, N1, M0) non-small cell lung cancer, adenocarcinoma with no actionable mutations presented with multiple pulmonary nodules in the right upper lobe as well as metastatic disease in intraparenchymal lymphadenopathy.  This was diagnosed in February 2020.  PRIOR THERAPY: 1) Status post right upper lobectomy with lymph node dissection. 2) Adjuvant systemic chemotherapy with cisplatin 75 mg/M2 and Alimta 500 mg/M2 every 3 weeks.  Status post 4 cycles.  CURRENT THERAPY: Observation.  INTERVAL HISTORY: Sean Rose 64 y.o. male returns to the clinic today for follow-up visit.  The patient is feeling fine today with no concerning complaints.  He denied having any chest pain, shortness of breath, cough or hemoptysis.  He denied having any nausea, vomiting, diarrhea or constipation.  He has no headache or visual changes.  He is here today for evaluation with repeat CT scan of the chest for restaging of his disease.  MEDICAL HISTORY: Past Medical History:  Diagnosis Date   Anginal pain ([place])    CAD (coronary artery disease)    CAP (community acquired pneumonia) 09/2016   COPD (chronic obstructive pulmonary disease) (HCC)    GERD (gastroesophageal reflux disease)    Heart murmur    "I was told I had one when I was a kid"   Hemorrhoids    History of anal fissures    "no surgeries" (10/30/2016)   Hyperlipidemia    Hypertension    lung ca dx'd 08/2018   Peripheral arterial disease ([place])    status post right common iliac artery stenting back in 2007   Seasonal allergies    Tobacco abuse     ALLERGIES:  is allergic to tyloxapol.  MEDICATIONS:  Current Outpatient Medications  Medication Sig Dispense Refill   amLODipine (NORVASC) 10 MG tablet TAKE 1 TABLET(10 MG) BY MOUTH DAILY 90 tablet  1   aspirin EC 81 MG tablet Take 81 mg by mouth daily.     clopidogrel (PLAVIX) 75 MG tablet TAKE 1 TABLET BY MOUTH EVERY DAY 90 tablet 1   diphenhydrAMINE (BENADRYL) 25 mg capsule Take 25 mg by mouth daily as needed for allergies.      doxycycline (VIBRA-TABS) 100 MG tablet Take 1 tablet (100 mg total) by mouth 2 (two) times daily. (Patient not taking: Reported on 04/28/2020) 14 tablet 0   ibuprofen (ADVIL,MOTRIN) 200 MG tablet Take 400 mg by mouth every 6 (six) hours as needed for headache or moderate pain.      MAGNESIUM-OXIDE 400 (241.3 Mg) MG tablet TAKE 1 TABLET BY MOUTH THREE TIMES DAILY 90 tablet 1   metoprolol succinate (TOPROL-XL) 100 MG 24 hr tablet TAKE 1 TABLET BY MOUTH EVERY DAY 90 tablet 2   Multiple Vitamin (MULTIVITAMIN WITH MINERALS) TABS tablet Take 1 tablet by mouth daily.     nitroGLYCERIN (NITROSTAT) 0.4 MG SL tablet PLACE 1 TABLET UNDER THE TONGUE EVERY 5 MINUTES AS NEEDED FOR CHEST PAIN 25 tablet 6   umeclidinium-vilanterol (ANORO ELLIPTA) 62.5-25 MCG/INH AEPB Inhale 1 puff into the lungs daily. 60 each 5   No current facility-administered medications for this visit.    SURGICAL HISTORY:  Past Surgical History:  Procedure Laterality Date   ANKLE SURGERY Left    "rebuilt it"   ANTERIOR CRUCIATE LIGAMENT REPAIR Right    CARDIAC CATHETERIZATION  09/23/2008   Continued medical  therapy - may need GI evaluation in addition.   CARDIAC CATHETERIZATION  10/28/2007   Medical therapy recommended.   CARDIAC CATHETERIZATION  11/18/2006   In-stent restenosis RCA  (50% distal edge, 80% segmental mid, and 50-60% segmental proximal). Successful cutting balloon atherectomy using a [id number] cutting balloon. 3 inflations with atherectomy performed on mid and proximal portions resulting in reduction of 80% mid in-stent restenosis to less than 20% residual and 50-60% segmental proximal to less than 20% residual without dissection.   CARDIAC CATHETERIZATION  02/26/2006   Severe  stenosis in RCA. Stenting performed using IVUS. 3.5x20 Maverick balloon deployed at [employer]. Distal stent-a 4x28 Liberte stent-deployed 12atm 48sec, 12atm 31sec, 4atm 19sec. Mid stent-a 4x28 Liberte stent-deployed 14atm 45sec, 14atm 60sec, 14atm 44sec. Proximal stent-4x8 Liberte- 14atm 45sec,14atm 47sec, 16atm 43sec. Severely diseased segment then appeared TIMI-3 flow.   CARDIOVASCULAR STRESS TEST  11/17/2012   No significant ECG changes. Septal perfusion defect is new when complared to study from 2010. Abnormal myocardial perfusion imaging with a basal to mid perfusion suggestive of previous MI.   CAROTID DOPPLER  08/09/2011   Bilateral Bulb/Proximal ICA - demonstrated a mild amount of fibrous plaque without evidence of significant diameter reduction reduction or other vascular abnormality.   CHEST TUBE INSERTION Right 09/02/2018   Procedure: Chest Tube Insertion;  Surgeon: Garner Nash, DO;  Location: [facility];  Service: Thoracic;  Laterality: Right;   COLONOSCOPY     2003, 2014   CORONARY ANGIOPLASTY WITH STENT PLACEMENT     FEMORAL ARTERY STENT     INGUINAL HERNIA REPAIR Right    KNEE ARTHROSCOPY Right "multiple"   LAPAROSCOPIC APPENDECTOMY N/A 12/07/2018   Procedure: APPENDECTOMY LAPAROSCOPIC;  Surgeon: Coralie Keens, MD;  Location: [facility];  Service: General;  Laterality: N/A;   LOWER EXTREMITY ARTERIAL DOPPLER  01/31/2011   Bilateral ABIs-normal values with no suggestion of arterial insuff to the lower extremities at rest. Right CIA stent-mild amount of nonhemodynamically significant plaque is noted throughout   [place]   "bone-eating tumor"   PERCUTANEOUS STENT INTERVENTION  04/04/2006 & 04/13/2015   a. Right common iliac artery with an 8.0x18 mm Herculink stent deployed at 12 atm. Stenosis was reduced from 80% to 0% with brisk flow. b. I-cast stenting to left common iliac artery   PERIPHERAL VASCULAR CATHETERIZATION N/A 04/13/2015   Procedure: Lower Extremity  Angiography;  Surgeon: Lorretta Harp, MD; L-oCIA 75%, 40-50% L-EIA, R-CIA stent patent, s/p 8 mm x 38 mm ICast covered stent>>0% stenosis in [place] Right    TRANSTHORACIC ECHOCARDIOGRAM  11/26/2012   EF not noted. Aortic valve-sclerosis without stenosis, no regurgiation.    UPPER GASTROINTESTINAL ENDOSCOPY     US CAROTID DOPPLER BILATERAL ([facility] HX)  08/09/2011   Bilateral Bulb/Proximal ICAa demonstrated a mild amount of fibrous plaque without evidence of significant diameter reduction or any other vascular abnormality.   VIDEO ASSISTED THORACOSCOPY (VATS)/ LOBECTOMY Right 09/14/2018   Procedure: RIGHT VIDEO ASSISTED THORACOSCOPY (VATS)/ RIGHT UPPER LOBECTOMY;  Surgeon: Melrose Nakayama, MD;  Location: [facility];  Service: Thoracic;  Laterality: Right;   VIDEO BRONCHOSCOPY WITH ENDOBRONCHIAL NAVIGATION N/A 09/02/2018   Procedure: VIDEO BRONCHOSCOPY WITH ENDOBRONCHIAL NAVIGATION;  Surgeon: Garner Nash, DO;  Location: [facility];  Service: Thoracic;  Laterality: N/A;    REVIEW OF SYSTEMS:  A comprehensive review of systems was negative.   PHYSICAL EXAMINATION: General appearance: alert, cooperative and no distress  Head: Normocephalic, without obvious abnormality, atraumatic Neck: no adenopathy, no JVD, supple, symmetrical, trachea midline and thyroid not enlarged, symmetric, no tenderness/mass/nodules Lymph nodes: Cervical, supraclavicular, and axillary nodes normal. Resp: clear to auscultation bilaterally Back: symmetric, no curvature. ROM normal. No CVA tenderness. Cardio: regular rate and rhythm, S1, S2 normal, no murmur, click, rub or gallop GI: soft, non-tender; bowel sounds normal; no masses,  no organomegaly Extremities: extremities normal, atraumatic, no cyanosis or edema  ECOG PERFORMANCE STATUS: 1 - Symptomatic but completely ambulatory  Blood pressure (!) 142/83, pulse 66, temperature 98 F (36.7 C), temperature source  Tympanic, resp. rate 18, height '5\' 8"'  (1.727 m), weight 145 lb 9.6 oz (66 kg), SpO2 100 %.  LABORATORY DATA: Lab Results  Component Value Date   WBC 7.4 06/19/2020   HGB 15.6 06/19/2020   HCT 43.2 06/19/2020   MCV 96.0 06/19/2020   PLT 341 06/19/2020      Chemistry      Component Value Date/Time   NA 130 (L) 06/19/2020 1104   NA 130 (L) 03/05/2017 1110   K 4.6 06/19/2020 1104   CL 97 (L) 06/19/2020 1104   CO2 23 06/19/2020 1104   BUN <4 (L) 06/19/2020 1104   BUN 4 (L) 03/05/2017 1110   CREATININE 0.83 06/19/2020 1104   CREATININE 0.76 04/28/2020 0826      Component Value Date/Time   CALCIUM 9.8 06/19/2020 1104   ALKPHOS 76 06/19/2020 1104   AST 41 06/19/2020 1104   ALT 22 06/19/2020 1104   BILITOT 0.6 06/19/2020 1104       RADIOGRAPHIC STUDIES: CT Chest W Contrast  Result Date: 06/19/2020 CLINICAL DATA:  Non-small-cell lung cancer.  Restaging. EXAM: CT CHEST WITH CONTRAST TECHNIQUE: Multidetector CT imaging of the chest was performed during intravenous contrast administration. CONTRAST:  3m OMNIPAQUE IOHEXOL 300 MG/ML  SOLN COMPARISON:  12/17/2019 FINDINGS: Cardiovascular: The heart size is normal. No substantial pericardial effusion. Coronary artery calcification is evident. Atherosclerotic calcification is noted in the wall of the thoracic aorta. Mediastinum/Nodes: No mediastinal lymphadenopathy. There is no hilar lymphadenopathy. The esophagus has normal imaging features. There is no axillary lymphadenopathy. Lungs/Pleura: Centrilobular and paraseptal emphysema evident. Surgical scarring noted right parahilar lung, stable no new suspicious pulmonary nodule or mass. 5 mm anterior left upper lobe nodule measured previously is stable on 64/7 today. Tiny left upper lobe nodule seen on images 91 and 104 of series 7 today are unchanged. No new suspicious pulmonary nodule or mass. No focal airspace consolidation. No pleural effusion. Upper Abdomen: Unremarkable. Musculoskeletal:  No worrisome lytic or sclerotic osseous abnormality. IMPRESSION: 1. Stable exam. No new or progressive findings to suggest recurrent or metastatic disease. 2. Aortic Atherosclerosis (ICD10-I70.0) and Emphysema (ICD10-J43.9). Electronically Signed   By: EMisty StanleyM.D.   On: 06/19/2020 16:43    ASSESSMENT AND PLAN: This is a very pleasant 64years old white male with a stage IIIB non-small cell lung cancer, adenocarcinoma with no actionable mutations status post right upper lobectomy with lymph node dissection under the care of Dr. HRoxan Hockey The patient completed a course of adjuvant treatment with cisplatin and Alimta status post 4 cycles.  He tolerated the previous 4 cycles of his treatment fairly well. The patient is currently on observation and he has been doing well with no concerning complaints. He had repeat CT scan of the chest performed recently.  I personally and independently reviewed the scans and discussed the results with the patient today. His scan showed no concerning findings  for disease recurrence or metastasis. I recommended for him to continue on observation with repeat CT scan of the chest in 6 months. He was advised to call immediately if he has any concerning symptoms in the interval. The patient voices understanding of current disease status and treatment options and is in agreement with the current care plan. All questions were answered. The patient knows to call the clinic with any problems, questions or concerns. We can certainly see the patient much sooner if necessary.  Disclaimer: This note was dictated with voice recognition software. Similar sounding words can inadvertently be transcribed and may not be corrected upon review.

## 2020-06-26 ENCOUNTER — Telehealth: Payer: Self-pay | Admitting: Internal Medicine

## 2020-06-26 NOTE — Telephone Encounter (Signed)
Scheduled per los. Called and left msg. Mailed printout  °

## 2020-07-17 ENCOUNTER — Other Ambulatory Visit: Payer: Self-pay | Admitting: Cardiovascular Disease

## 2020-08-03 DIAGNOSIS — Z20822 Contact with and (suspected) exposure to covid-19: Secondary | ICD-10-CM | POA: Diagnosis not present

## 2020-08-03 DIAGNOSIS — U071 COVID-19: Secondary | ICD-10-CM | POA: Diagnosis not present

## 2020-08-21 ENCOUNTER — Ambulatory Visit (HOSPITAL_COMMUNITY)
Admission: RE | Admit: 2020-08-21 | Discharge: 2020-08-21 | Disposition: A | Discharge status: Home / Self Care | Payer: BC Managed Care – PPO | Source: Ambulatory Visit | Attending: Cardiology | Admitting: Cardiology

## 2020-08-21 ENCOUNTER — Other Ambulatory Visit: Payer: Self-pay

## 2020-08-21 ENCOUNTER — Other Ambulatory Visit (HOSPITAL_COMMUNITY): Payer: Self-pay | Admitting: Cardiovascular Disease

## 2020-08-21 ENCOUNTER — Ambulatory Visit (HOSPITAL_BASED_OUTPATIENT_CLINIC_OR_DEPARTMENT_OTHER)
Admission: RE | Admit: 2020-08-21 | Discharge: 2020-08-21 | Disposition: A | Discharge status: Home / Self Care | Payer: BC Managed Care – PPO | Source: Ambulatory Visit | Attending: Cardiology | Admitting: Cardiology

## 2020-08-21 DIAGNOSIS — I739 Peripheral vascular disease, unspecified: Secondary | ICD-10-CM | POA: Diagnosis not present

## 2020-08-21 DIAGNOSIS — Z95828 Presence of other vascular implants and grafts: Secondary | ICD-10-CM | POA: Insufficient documentation

## 2020-08-25 ENCOUNTER — Encounter: Payer: Self-pay | Admitting: Cardiovascular Disease

## 2020-08-25 ENCOUNTER — Other Ambulatory Visit: Payer: Self-pay

## 2020-08-25 ENCOUNTER — Ambulatory Visit (INDEPENDENT_AMBULATORY_CARE_PROVIDER_SITE_OTHER): Payer: BC Managed Care – PPO | Admitting: Cardiovascular Disease

## 2020-08-25 VITALS — BP 130/86 | HR 62 | Ht 68.5 in | Wt 143.0 lb

## 2020-08-25 DIAGNOSIS — E785 Hyperlipidemia, unspecified: Secondary | ICD-10-CM

## 2020-08-25 DIAGNOSIS — I739 Peripheral vascular disease, unspecified: Secondary | ICD-10-CM | POA: Diagnosis not present

## 2020-08-25 DIAGNOSIS — I10 Essential (primary) hypertension: Secondary | ICD-10-CM | POA: Diagnosis not present

## 2020-08-25 DIAGNOSIS — I2510 Atherosclerotic heart disease of native coronary artery without angina pectoris: Secondary | ICD-10-CM | POA: Diagnosis not present

## 2020-08-25 NOTE — Assessment & Plan Note (Signed)
History of PAD status post right common iliac artery stenting by myself in 2007.  I last studied him 04/13/2015 revealing a patent right iliac stent with a physiologically significant left common iliac artery stenosis which I stented with a 8 mm x 38 mm long I cast covered stent.  He denies claudication.  His most recent Doppler studies performed 08/21/2020 revealed normal ABIs bilaterally with widely patent stents.

## 2020-08-25 NOTE — Assessment & Plan Note (Signed)
History of hyperlipidemia not on statin therapy with lipid profile performed 03/05/2017 revealing total cholesterol 162, LDL of 73 and HDL of 59.  We will obtain a lipid liver profile this morning.

## 2020-08-25 NOTE — Assessment & Plan Note (Signed)
History of essential hypertension a blood pressure measured today 130/86.  He is on amlodipine and metoprolol.

## 2020-08-25 NOTE — Patient Instructions (Addendum)
Medication Instructions:  Your physician recommends that you continue on your current medications as directed. Please refer to the Current Medication list given to you today.  *If you need a refill on your cardiac medications before your next appointment, please call your pharmacy*   Lab Work: Your physician recommends that you have labs drawn today: lipid/liver profile  If you have labs (blood work) drawn today and your tests are completely normal, you will receive your results only by: Marland Kitchen MyChart Message (if you have MyChart) OR . A paper copy in the mail If you have any lab test that is abnormal or we need to change your treatment, we will call you to review the results.  Testing/Procedures: Your physician has requested that you have an abdominal aorta duplex. During this test, an ultrasound is used to evaluate the aorta. Allow 30 minutes for this exam. Do not eat after midnight the day before and avoid carbonated beverages. We do this at [facility] [address] [facility]. To be done in Jan. 2023   Follow-Up: At [facility], you and your health needs are our priority.  As part of our continuing mission to provide you with exceptional heart care, we have created designated Provider Care Teams.  These Care Teams include your primary Cardiologist (physician) and Advanced Practice Providers (APPs -  Physician Assistants and Nurse Practitioners) who all work together to provide you with the care you need, when you need it.  We recommend signing up for the patient portal called "MyChart".  Sign up information is provided on this After Visit Summary.  MyChart is used to connect with patients for Virtual Visits (Telemedicine).  Patients are able to view lab/test results, encounter notes, upcoming appointments, etc.  Non-urgent messages can be sent to your provider as well.   To learn more about what you can do with MyChart, go to [email or web address].    Your next appointment:   12  month(s)  The format for your next appointment:   In Person  Provider:   Quay Burow, MD

## 2020-08-25 NOTE — Assessment & Plan Note (Signed)
History of CAD status post RCA stenting by Dr. Rex Kras in 2007.  I relooked at him several years later and redilated for in-stent restenosis.  Because of chest pain he had a Myoview stress test performed 05/14/2019 which was entirely normal.  He does get occasional rare chest pain.

## 2020-08-25 NOTE — Progress Notes (Signed)
08/25/2020 Sean Rose   21-Jan-1956  [id number]  Primary Physician [name], NP Primary Cardiologist: Lorretta Harp MD FACP, [place], [place], [place]  HPI:  Sean Rose is a 65 y.o.  thin appearing married Caucasian male father of 2 who I last saw in the office10/13/2020.Marland Kitchen He has a history of RCA stenting in the past with bare metal stents. He has had in-stent restenosis with re-dilatation approximately 4 years ago. He has also had remote right common iliac artery PTA and stenting back in 2007. His other problems include hypertension, hyperlipidemia and continued tobacco abuse at1-1/2 packs per day despite counseling to the contrary. He denies chest pain , shortness of breath .He was complaining of some claudication back in September and we ultimately proceeded with angiography 04/13/15 revealing a widely patent right iliac stent with 75% ostial left common iliac artery stenosis with a 40 mm gradient. I ultimately stented this with an 8 mm x 38 mm long ICastcovered stent with excellent angiographic clinical and ultrasonographic result. He no longer has claudication.His most recent Dopplers performed 08/16/16 revealed normal ABIs by bilaterally with widely patent stents. Since I saw him a year ago he was diagnosed with adenocarcinoma of the lung stage III and went underwent right upper lobectomy by [name].  He did have adjuvant chemotherapy and radiation therapy followed by Dr. Earlie Server.  He stopped smoking 09/01/2018.  Over the last month or so he is noticed increasing shortness of breath and some occasional chest tightness which she says is similar to his symptoms prior to his stent in his heart.  He does have coronary calcification on recent chest CT. I obtained a Myoview stress test 05/14/2019 which was entirely normal.  Since I saw him a year ago and a half ago follow-up of his lung CA shows there has been no recurrence.  He did stop smoking 09/01/2018.  He had occasional  recent episodes of chest pain but for the most part of last year and a half he has been few and far between.  He denies claudication.  Current Meds  Medication Sig  . amLODipine (NORVASC) 10 MG tablet TAKE 1 TABLET(10 MG) BY MOUTH DAILY  . aspirin EC 81 MG tablet Take 81 mg by mouth daily.  . clopidogrel (PLAVIX) 75 MG tablet TAKE 1 TABLET BY MOUTH EVERY DAY  . diphenhydrAMINE (BENADRYL) 25 mg capsule Take 25 mg by mouth daily as needed for allergies.   Marland Kitchen ibuprofen (ADVIL,MOTRIN) 200 MG tablet Take 400 mg by mouth every 6 (six) hours as needed for headache or moderate pain.   Marland Kitchen MAGNESIUM-OXIDE 400 (241.3 Mg) MG tablet TAKE 1 TABLET BY MOUTH THREE TIMES DAILY  . metoprolol succinate (TOPROL-XL) 100 MG 24 hr tablet TAKE 1 TABLET BY MOUTH EVERY DAY  . Multiple Vitamin (MULTIVITAMIN WITH MINERALS) TABS tablet Take 1 tablet by mouth daily.  . nitroGLYCERIN (NITROSTAT) 0.4 MG SL tablet PLACE 1 TABLET UNDER THE TONGUE EVERY 5 MINUTES AS NEEDED FOR CHEST PAIN  . umeclidinium-vilanterol (ANORO ELLIPTA) 62.5-25 MCG/INH AEPB Inhale 1 puff into the lungs daily.     Allergies  Allergen Reactions  . Tyloxapol Nausea Only    Social History   Socioeconomic History  . Marital status: Married    Spouse name: Not on file  . Number of children: Not on file  . Years of education: Not on file  . Highest education level: Not on file  Occupational History  . Not on file  Tobacco  Use  . Smoking status: Former Smoker    Packs/day: 0.25    Years: 38.00    Pack years: 9.50    Types: Cigarettes    Quit date: 10/19/2018    Years since quitting: 1.8  . Smokeless tobacco: Never Used  . Tobacco comment: pack per day 12.17.19  Vaping Use  . Vaping Use: Former  Substance and Sexual Activity  . Alcohol use: Yes    Comment: socially  . Drug use: No  . Sexual activity: Yes  Other Topics Concern  . Not on file  Social History Narrative  . Not on file   Social Determinants of Health   Financial  Resource Strain: Not on file  Food Insecurity: Not on file  Transportation Needs: Not on file  Physical Activity: Not on file  Stress: Not on file  Social Connections: Not on file  Intimate Partner Violence: Not on file     Review of Systems: General: negative for chills, fever, night sweats or weight changes.  Cardiovascular: negative for chest pain, dyspnea on exertion, edema, orthopnea, palpitations, paroxysmal nocturnal dyspnea or shortness of breath Dermatological: negative for rash Respiratory: negative for cough or wheezing Urologic: negative for hematuria Abdominal: negative for nausea, vomiting, diarrhea, bright red blood per rectum, melena, or hematemesis Neurologic: negative for visual changes, syncope, or dizziness All other systems reviewed and are otherwise negative except as noted above.    Blood pressure 130/86, pulse 62, height 5' 8.5" (1.74 m), weight 143 lb (64.9 kg).  General appearance: alert and no distress Neck: no adenopathy, no carotid bruit, no JVD, supple, symmetrical, trachea midline and thyroid not enlarged, symmetric, no tenderness/mass/nodules Lungs: clear to auscultation bilaterally Heart: regular rate and rhythm, S1, S2 normal, no murmur, click, rub or gallop Extremities: extremities normal, atraumatic, no cyanosis or edema Pulses: 2+ and symmetric Skin: Skin color, texture, turgor normal. No rashes or lesions Neurologic: Alert and oriented X 3, normal strength and tone. Normal symmetric reflexes. Normal coordination and gait  EKG sinus rhythm at 63 without ST or T wave changes.  I personally reviewed this EKG.  ASSESSMENT AND PLAN:   PVD (peripheral vascular disease) History of PAD status post right common iliac artery stenting by myself in 2007.  I last studied him 04/13/2015 revealing a patent right iliac stent with a physiologically significant left common iliac artery stenosis which I stented with a 8 mm x 38 mm long I cast covered stent.  He  denies claudication.  His most recent Doppler studies performed 08/21/2020 revealed normal ABIs bilaterally with widely patent stents.  Coronary atherosclerosis of native coronary artery History of CAD status post RCA stenting by Dr. Rex Kras in 2007.  I relooked at him several years later and redilated for in-stent restenosis.  Because of chest pain he had a Myoview stress test performed 05/14/2019 which was entirely normal.  He does get occasional rare chest pain.  Essential hypertension History of essential hypertension a blood pressure measured today 130/86.  He is on amlodipine and metoprolol.  Hyperlipidemia History of hyperlipidemia not on statin therapy with lipid profile performed 03/05/2017 revealing total cholesterol 162, LDL of 73 and HDL of 59.  We will obtain a lipid liver profile this morning.      Lorretta Harp MD FACP,FACC,FAHA, [facility] 08/25/2020 11:45 AM

## 2020-08-26 LAB — LIPID PANEL
Chol/HDL Ratio: 3.3 ratio (ref 0.0–5.0)
Cholesterol, Total: 193 mg/dL (ref 100–199)
HDL: 59 mg/dL (ref >39)
LDL Chol Calc (NIH): 105 mg/dL — ABNORMAL HIGH (ref 0–99)
Triglycerides: 171 mg/dL — ABNORMAL HIGH (ref 0–149)
VLDL Cholesterol Cal: 29 mg/dL (ref 5–40)

## 2020-08-26 LAB — HEPATIC FUNCTION PANEL
ALT: 26 IU/L (ref 0–44)
AST: 48 IU/L — ABNORMAL HIGH (ref 0–40)
Albumin: 4.6 g/dL (ref 3.8–4.8)
Alkaline Phosphatase: 120 IU/L (ref 44–121)
Bilirubin Total: 0.7 mg/dL (ref 0.0–1.2)
Bilirubin, Direct: 0.27 mg/dL (ref 0.00–0.40)
Total Protein: 7.7 g/dL (ref 6.0–8.5)

## 2020-08-30 ENCOUNTER — Telehealth: Payer: Self-pay

## 2020-08-30 DIAGNOSIS — M25512 Pain in left shoulder: Secondary | ICD-10-CM | POA: Diagnosis not present

## 2020-08-30 DIAGNOSIS — E785 Hyperlipidemia, unspecified: Secondary | ICD-10-CM

## 2020-08-30 DIAGNOSIS — M542 Cervicalgia: Secondary | ICD-10-CM | POA: Diagnosis not present

## 2020-08-30 MED ORDER — ROSUVASTATIN CALCIUM 5 MG PO TABS: 5.0000 mg | ORAL_TABLET | ORAL | 3 refills | Status: DC

## 2020-08-30 NOTE — Telephone Encounter (Signed)
Spoke with pt on the phone.  Pt hoping that cholesterol levels would come back good after quitting smoking and dietary changes.  Pt inquires about what other factors could be effecting his cholesterol levels. Explained to pt that family history and genetics can play a role in cholesterol levels. Explained to pt that Dr. Gwenlyn Found would like for him to start rosuvastatin (crestor) 5mg  every other day.  Pt encouraged to call our office if he notices any negative side effects of statin after trying for 1-2 weeks.  Advised pt that we would do labs to check his cholesterol levels again in 3 months to follow up.  Pt verbalizes understanding. Prescription sent to pharmacy, lab orders placed and mailed to pt.

## 2020-09-07 DIAGNOSIS — M25511 Pain in right shoulder: Secondary | ICD-10-CM | POA: Diagnosis not present

## 2020-09-11 ENCOUNTER — Telehealth: Payer: Self-pay | Admitting: Medical Oncology

## 2020-09-11 ENCOUNTER — Telehealth: Payer: Self-pay | Admitting: Internal Medicine

## 2020-09-11 DIAGNOSIS — M7592 Shoulder lesion, unspecified, left shoulder: Secondary | ICD-10-CM | POA: Diagnosis not present

## 2020-09-11 NOTE — Telephone Encounter (Signed)
Scheduled appt per 2/14 sch msg- pt is aware of appt date and time   

## 2020-09-11 NOTE — Telephone Encounter (Signed)
Left shoulder pain since first Covid vaccine 2021. " It is getting progressively  worse where I cannot lift a full glass of water above my waist or open a care door with my left arm"  Dr Mardelle Matte  ordered MRI and told him there is a mass and it is not an orthopedic and to f/u with [name].  Schedule message sent for appt this week.

## 2020-09-12 ENCOUNTER — Other Ambulatory Visit: Payer: Self-pay

## 2020-09-12 ENCOUNTER — Inpatient Hospital Stay: Payer: BC Managed Care – PPO | Attending: Internal Medicine | Admitting: Internal Medicine

## 2020-09-12 ENCOUNTER — Encounter: Payer: Self-pay | Admitting: Internal Medicine

## 2020-09-12 ENCOUNTER — Other Ambulatory Visit: Payer: Self-pay | Admitting: Medical Oncology

## 2020-09-12 VITALS — BP 143/76 | HR 68 | Temp 97.3°F | Resp 13 | Ht 68.5 in | Wt 143.9 lb

## 2020-09-12 DIAGNOSIS — C3491 Malignant neoplasm of unspecified part of right bronchus or lung: Secondary | ICD-10-CM

## 2020-09-12 DIAGNOSIS — Z9221 Personal history of antineoplastic chemotherapy: Secondary | ICD-10-CM | POA: Insufficient documentation

## 2020-09-12 DIAGNOSIS — Z85118 Personal history of other malignant neoplasm of bronchus and lung: Secondary | ICD-10-CM | POA: Diagnosis not present

## 2020-09-12 DIAGNOSIS — C3490 Malignant neoplasm of unspecified part of unspecified bronchus or lung: Secondary | ICD-10-CM | POA: Diagnosis not present

## 2020-09-12 DIAGNOSIS — I10 Essential (primary) hypertension: Secondary | ICD-10-CM

## 2020-09-12 DIAGNOSIS — Z902 Acquired absence of lung [part of]: Secondary | ICD-10-CM | POA: Insufficient documentation

## 2020-09-12 DIAGNOSIS — M25512 Pain in left shoulder: Secondary | ICD-10-CM | POA: Insufficient documentation

## 2020-09-12 NOTE — Progress Notes (Signed)
[place] [phone]   [phone]  OFFICE PROGRESS NOTE  [name], NP [place] [address]  DIAGNOSIS: Stage IIIA (T3, N1, M0) non-small cell lung cancer, adenocarcinoma with no actionable mutations presented with multiple pulmonary nodules in the right upper lobe as well as metastatic disease in intraparenchymal lymphadenopathy.  This was diagnosed in February 2020.  PRIOR THERAPY: 1) Status post right upper lobectomy with lymph node dissection. 2) Adjuvant systemic chemotherapy with cisplatin 75 mg/M2 and Alimta 500 mg/M2 every 3 weeks.  Status post 4 cycles.  CURRENT THERAPY: Observation.  INTERVAL HISTORY: Sean Rose 65 y.o. male returns to the clinic today for follow-up visit.  The patient is feeling fine today with no concerning complaints except for pain on the left shoulder area.  The patient mentioned that this pain started in the spring of last year but has been getting worse recently.  His last CT scan of the chest in November 2021 did not show any abnormality in that area but it may not have been included and the images of the scan.  He had MRI of the left shoulder by his primary care physician that showed large destructive lesion in the glenoid and coracoid process highly suspicious for metastatic lung carcinoma giving the patient his history but primary bone lesion was not completely excluded.  There was also some regional lymphadenopathy noted but the patient had a Covid vaccine shot in that area recently. He denied having any current chest pain, shortness of breath, cough or hemoptysis.  He denied having any fever or chills.  He has no nausea, vomiting, diarrhea or constipation.  He denied having any headache or visual changes.  He is here today for evaluation and recommendation regarding his condition.  MEDICAL HISTORY: Past Medical History:  Diagnosis Date  . Anginal pain ([place])   . CAD (coronary  artery disease)   . CAP (community acquired pneumonia) 09/2016  . COPD (chronic obstructive pulmonary disease) ([place])   . GERD (gastroesophageal reflux disease)   . Heart murmur    "I was told I had one when I was a kid"  . Hemorrhoids   . History of anal fissures    "no surgeries" (10/30/2016)  . Hyperlipidemia   . Hypertension   . lung ca dx'd 08/2018  . Peripheral arterial disease ([place])    status post right common iliac artery stenting back in 2007  . Seasonal allergies   . Tobacco abuse     ALLERGIES:  is allergic to tyloxapol.  MEDICATIONS:  Current Outpatient Medications  Medication Sig Dispense Refill  . amLODipine (NORVASC) 10 MG tablet TAKE 1 TABLET(10 MG) BY MOUTH DAILY 90 tablet 1  . aspirin EC 81 MG tablet Take 81 mg by mouth daily.    . clopidogrel (PLAVIX) 75 MG tablet TAKE 1 TABLET BY MOUTH EVERY DAY 90 tablet 1  . diphenhydrAMINE (BENADRYL) 25 mg capsule Take 25 mg by mouth daily as needed for allergies.     Marland Kitchen ibuprofen (ADVIL,MOTRIN) 200 MG tablet Take 400 mg by mouth every 6 (six) hours as needed for headache or moderate pain.     Marland Kitchen MAGNESIUM-OXIDE 400 (241.3 Mg) MG tablet TAKE 1 TABLET BY MOUTH THREE TIMES DAILY 90 tablet 1  . metoprolol succinate (TOPROL-XL) 100 MG 24 hr tablet TAKE 1 TABLET BY MOUTH EVERY DAY 90 tablet 0  . Multiple Vitamin (MULTIVITAMIN WITH MINERALS) TABS tablet Take 1 tablet by mouth  daily.    . nitroGLYCERIN (NITROSTAT) 0.4 MG SL tablet PLACE 1 TABLET UNDER THE TONGUE EVERY 5 MINUTES AS NEEDED FOR CHEST PAIN 25 tablet 6  . rosuvastatin (CRESTOR) 5 MG tablet Take 1 tablet (5 mg total) by mouth every other day. 45 tablet 3  . umeclidinium-vilanterol (ANORO ELLIPTA) 62.5-25 MCG/INH AEPB Inhale 1 puff into the lungs daily. 60 each 5   No current facility-administered medications for this visit.    SURGICAL HISTORY:  Past Surgical History:  Procedure Laterality Date  . ANKLE SURGERY Left    "rebuilt it"  . ANTERIOR CRUCIATE LIGAMENT REPAIR  Right   . CARDIAC CATHETERIZATION  09/23/2008   Continued medical therapy - may need GI evaluation in addition.  Marland Kitchen CARDIAC CATHETERIZATION  10/28/2007   Medical therapy recommended.  Marland Kitchen CARDIAC CATHETERIZATION  11/18/2006   In-stent restenosis RCA  (50% distal edge, 80% segmental mid, and 50-60% segmental proximal). Successful cutting balloon atherectomy using a [id number] cutting balloon. 3 inflations with atherectomy performed on mid and proximal portions resulting in reduction of 80% mid in-stent restenosis to less than 20% residual and 50-60% segmental proximal to less than 20% residual without dissection.  Marland Kitchen CARDIAC CATHETERIZATION  02/26/2006   Severe stenosis in RCA. Stenting performed using IVUS. 3.5x20 Maverick balloon deployed at [employer]. Distal stent-a 4x28 Liberte stent-deployed 12atm 48sec, 12atm 31sec, 4atm 19sec. Mid stent-a 4x28 Liberte stent-deployed 14atm 45sec, 14atm 60sec, 14atm 44sec. Proximal stent-4x8 Liberte- 14atm 45sec,14atm 47sec, 16atm 43sec. Severely diseased segment then appeared TIMI-3 flow.  Marland Kitchen CARDIOVASCULAR STRESS TEST  11/17/2012   No significant ECG changes. Septal perfusion defect is new when complared to study from 2010. Abnormal myocardial perfusion imaging with a basal to mid perfusion suggestive of previous MI.  Marland Kitchen CAROTID DOPPLER  08/09/2011   Bilateral Bulb/Proximal ICA - demonstrated a mild amount of fibrous plaque without evidence of significant diameter reduction reduction or other vascular abnormality.  . CHEST TUBE INSERTION Right 09/02/2018   Procedure: Chest Tube Insertion;  Surgeon: Garner Nash, DO;  Location: [facility];  Service: Thoracic;  Laterality: Right;  . COLONOSCOPY     2003, 2014  . CORONARY ANGIOPLASTY WITH STENT PLACEMENT    . FEMORAL ARTERY STENT    . INGUINAL HERNIA REPAIR Right   . KNEE ARTHROSCOPY Right "multiple"  . LAPAROSCOPIC APPENDECTOMY N/A 12/07/2018   Procedure: APPENDECTOMY LAPAROSCOPIC;  Surgeon: Coralie Keens, MD;  Location: [facility];  Service: General;  Laterality: N/A;  . LOWER EXTREMITY ARTERIAL DOPPLER  01/31/2011   Bilateral ABIs-normal values with no suggestion of arterial insuff to the lower extremities at rest. Right CIA stent-mild amount of nonhemodynamically significant plaque is noted throughout  . 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 PERCUTANEOUS STENT INTERVENTION  04/04/2006 & 04/13/2015   a. Right common iliac artery with an 8.0x18 mm Herculink stent deployed at 12 atm. Stenosis was reduced from 80% to 0% with brisk flow. b. I-cast stenting to left common iliac artery  . PERIPHERAL VASCULAR CATHETERIZATION N/A 04/13/2015   Procedure: Lower Extremity Angiography;  Surgeon: Lorretta Harp, MD; L-oCIA 75%, 40-50% L-EIA, R-CIA stent patent, s/p 8 mm x 38 mm ICast covered stent>>0% stenosis in L-oCIA     . SHOULDER ARTHROSCOPY WITH ROTATOR CUFF REPAIR Right   . TRANSTHORACIC ECHOCARDIOGRAM  11/26/2012   EF not noted. Aortic valve-sclerosis without stenosis, no regurgiation.   Marland Kitchen UPPER GASTROINTESTINAL ENDOSCOPY    . US CAROTID DOPPLER BILATERAL ([place] [facility])  08/09/2011  Bilateral Bulb/Proximal ICAa demonstrated a mild amount of fibrous plaque without evidence of significant diameter reduction or any other vascular abnormality.  Marland Kitchen VIDEO ASSISTED THORACOSCOPY (VATS)/ LOBECTOMY Right 09/14/2018   Procedure: RIGHT VIDEO ASSISTED THORACOSCOPY (VATS)/ RIGHT UPPER LOBECTOMY;  Surgeon: Melrose Nakayama, MD;  Location: [facility];  Service: Thoracic;  Laterality: Right;  Marland Kitchen VIDEO BRONCHOSCOPY WITH ENDOBRONCHIAL NAVIGATION N/A 09/02/2018   Procedure: VIDEO BRONCHOSCOPY WITH ENDOBRONCHIAL NAVIGATION;  Surgeon: Garner Nash, DO;  Location: [facility];  Service: Thoracic;  Laterality: N/A;    REVIEW OF SYSTEMS:  Constitutional: negative Eyes: negative Ears, nose, mouth, throat, and face: negative Respiratory: negative Cardiovascular: negative Gastrointestinal: negative Genitourinary:negative Integument/breast:  negative Hematologic/lymphatic: negative Musculoskeletal:positive for bone pain Neurological: negative Behavioral/Psych: negative Endocrine: negative Allergic/Immunologic: negative   PHYSICAL EXAMINATION: General appearance: alert, cooperative and no distress Head: Normocephalic, without obvious abnormality, atraumatic Neck: no adenopathy, no JVD, supple, symmetrical, trachea midline and thyroid not enlarged, symmetric, no tenderness/mass/nodules Lymph nodes: Cervical, supraclavicular, and axillary nodes normal. Resp: clear to auscultation bilaterally Back: symmetric, no curvature. ROM normal. No CVA tenderness. Cardio: regular rate and rhythm, S1, S2 normal, no murmur, click, rub or gallop GI: soft, non-tender; bowel sounds normal; no masses,  no organomegaly Extremities: extremities normal, atraumatic, no cyanosis or edema Neurologic: Alert and oriented X 3, normal strength and tone. Normal symmetric reflexes. Normal coordination and gait  ECOG PERFORMANCE STATUS: 1 - Symptomatic but completely ambulatory  Blood pressure (!) 143/76, pulse 68, temperature (!) 97.3 F (36.3 C), temperature source Tympanic, resp. rate 13, height 5' 8.5" (1.74 m), weight 143 lb 14.4 oz (65.3 kg), SpO2 100 %.  LABORATORY DATA: Lab Results  Component Value Date   WBC 7.4 06/19/2020   HGB 15.6 06/19/2020   HCT 43.2 06/19/2020   MCV 96.0 06/19/2020   PLT 341 06/19/2020      Chemistry      Component Value Date/Time   NA 130 (L) 06/19/2020 1104   NA 130 (L) 03/05/2017 1110   K 4.6 06/19/2020 1104   CL 97 (L) 06/19/2020 1104   CO2 23 06/19/2020 1104   BUN <4 (L) 06/19/2020 1104   BUN 4 (L) 03/05/2017 1110   CREATININE 0.83 06/19/2020 1104   CREATININE 0.76 04/28/2020 0826      Component Value Date/Time   CALCIUM 9.8 06/19/2020 1104   ALKPHOS 120 08/25/2020 1201   AST 48 (H) 08/25/2020 1201   AST 41 06/19/2020 1104   ALT 26 08/25/2020 1201   ALT 22 06/19/2020 1104   BILITOT 0.7 08/25/2020  1201   BILITOT 0.6 06/19/2020 1104       RADIOGRAPHIC STUDIES: VAS [place] ABI WITH/WO TBI  Result Date: 08/21/2020 LOWER EXTREMITY DOPPLER STUDY Indications: Peripheral artery disease follow-up. Bilateral common iliac artery              stenting. Patient reports no significant claudication symptoms or              leg pains. High Risk Factors: Hypertension, hyperlipidemia, past history of smoking,                    coronary artery disease.  Vascular Interventions: Remote history of right common iliac artery PTA and                         stenting in 2007. Left common iliac stenting on  04/13/2015. Comparison       Previous ABIs 08/19/19 were 1.07 on the right and 1.03 on the Study:           left. Performing Technologist: Mariane Masters RVT  Examination Guidelines: A complete evaluation includes at minimum, Doppler waveform signals and systolic blood pressure reading at the level of bilateral brachial, anterior tibial, and posterior tibial arteries, when vessel segments are accessible. Bilateral testing is considered an integral part of a complete examination. Photoelectric Plethysmograph (PPG) waveforms and toe systolic pressure readings are included as required and additional duplex testing as needed. Limited examinations for reoccurring indications may be performed as noted.  ABI Findings: +---------+------------------+-----+--------+--------+ Right    Rt Pressure (mmHg)IndexWaveformComment  +---------+------------------+-----+--------+--------+ Brachial 139                                     +---------+------------------+-----+--------+--------+ ATA      144               1.01 biphasic         +---------+------------------+-----+--------+--------+ PTA      150               1.05 biphasic         +---------+------------------+-----+--------+--------+ PERO     137               0.96 biphasic         +---------+------------------+-----+--------+--------+  Great Toe96                0.67 Abnormal         +---------+------------------+-----+--------+--------+ +---------+------------------+-----+--------+-------+ Left     Lt Pressure (mmHg)IndexWaveformComment +---------+------------------+-----+--------+-------+ Brachial 143                                    +---------+------------------+-----+--------+-------+ ATA      153               1.07 biphasic        +---------+------------------+-----+--------+-------+ PTA      159               1.11 biphasic        +---------+------------------+-----+--------+-------+ PERO     163               1.14 biphasic        +---------+------------------+-----+--------+-------+ Doristine Devoid Toe86                0.60 Abnormal        +---------+------------------+-----+--------+-------+ +-------+-----------+-----------+------------+------------+ ABI/TBIToday's ABIToday's TBIPrevious ABIPrevious TBI +-------+-----------+-----------+------------+------------+ Right  1.05       0.67       1.07        0.44         +-------+-----------+-----------+------------+------------+ Left   1.14       0.60       1.03        0.56         +-------+-----------+-----------+------------+------------+ Bilateral ABIs appear essentially unchanged compared to prior study on 08/19/19.  Summary: Right: Resting right ankle-brachial index is within normal range. No evidence of significant right lower extremity arterial disease. The right toe-brachial index is abnormal. Left: Resting left ankle-brachial index is within normal range. No evidence of significant left lower extremity arterial disease. The left toe-brachial index is abnormal.  *See table(s) above for measurements and observations. See  Aorta-iliac duplex report.  Suggest follow up study in 12 months. Electronically signed by Jenkins Rouge MD on 08/21/2020 at 7:33:28 PM.    Final    VAS US AORTA/IVC/ILIACS  Result Date: 08/21/2020 ABDOMINAL AORTA STUDY  Indications: History of bilateral common iliac artery PTA and stenting. Patient              reports no significant claudication symptoms or leg pains. Risk Factors: Hypertension, hyperlipidemia, past history of smoking, coronary               artery disease. Other Factors: Today's ABIs are 1.05 on the right and 1.14 on the left. Vascular Interventions: Remote history of right common iliac artery PTA and                         stenting in 2007. Left common iliac artery stenting on                         04/13/2015. Limitations: Air/bowel gas, flash artifact, acoustic shadowing.  Comparison Study: Previous aorta-iliac duplex performed 08/19/19 showed patent                   stents without significant restenosis. Performing Technologist: Mariane Masters RVT  Examination Guidelines: A complete evaluation includes B-mode imaging, spectral Doppler, color Doppler, and power Doppler as needed of all accessible portions of each vessel. Bilateral testing is considered an integral part of a complete examination. Limited examinations for reoccurring indications may be performed as noted.  Abdominal Aorta Findings: +-------------+-------+----------+----------+---------+--------+--------+ Location     AP (cm)Trans (cm)PSV (cm/s)Waveform ThrombusComments +-------------+-------+----------+----------+---------+--------+--------+ Proximal     1.90   2.00      63        biphasic                  +-------------+-------+----------+----------+---------+--------+--------+ Mid          1.90   1.90      36        biphasic                  +-------------+-------+----------+----------+---------+--------+--------+ Distal       2.60   2.50      30        biphasic         ectatic  +-------------+-------+----------+----------+---------+--------+--------+ RT CIA Prox                   74        biphasic                  +-------------+-------+----------+----------+---------+--------+--------+ RT CIA Mid                     109       biphasic                  +-------------+-------+----------+----------+---------+--------+--------+ RT CIA Distal                 165       biphasic                  +-------------+-------+----------+----------+---------+--------+--------+ RT EIA Prox                   128       biphasic                  +-------------+-------+----------+----------+---------+--------+--------+ RT EIA  Mid                    95        biphasic                  +-------------+-------+----------+----------+---------+--------+--------+ RT EIA Distal                 71        biphasic                  +-------------+-------+----------+----------+---------+--------+--------+ LT CIA Prox                   110       biphasic                  +-------------+-------+----------+----------+---------+--------+--------+ LT CIA Mid                    96        biphasic                  +-------------+-------+----------+----------+---------+--------+--------+ LT CIA Distal                 102       triphasic                 +-------------+-------+----------+----------+---------+--------+--------+ LT EIA Prox                   163       biphasic                  +-------------+-------+----------+----------+---------+--------+--------+ LT EIA Mid                    180       biphasic                  +-------------+-------+----------+----------+---------+--------+--------+ LT EIA Distal                 143       biphasic                  +-------------+-------+----------+----------+---------+--------+--------+ Visualization of the Left CIA Proximal artery and Left CIA Mid artery was limited due to acoustic shadowing. Unable to accurately visualize stent struts, therefore velocities are listed in the native table. IVC/Iliac Findings: +--------+------+--------+--------+   IVC   PatentThrombusComments +--------+------+--------+--------+  IVC Proxpatent                 +--------+------+--------+--------+    Summary: Abdominal Aorta: The largest aortic measurement is 2.6 cm. The distal aorta is mildly ectatic. Stenosis: Aorta-iliac atherosclerosis, without evidence of significant stenosis. Bilateral common iliac artery stents appear patent.  *See table(s) above for measurements and observations. See ABI report. Suggest follow up study in 12 months.  Electronically signed by Jenkins Rouge MD on 08/21/2020 at 7:31:59 PM.    Final     ASSESSMENT AND PLAN: This is a very pleasant 65 years old white male with a stage IIIB non-small cell lung cancer, adenocarcinoma with no actionable mutations status post right upper lobectomy with lymph node dissection under the care of [name]. The patient completed a course of adjuvant treatment with cisplatin and Alimta status post 4 cycles.  He tolerated the previous 4 cycles of his treatment fairly well. The patient has been in observation since the completion of his treatment and feeling fine except for the  pain in the left shoulder  area that has been going on since the spring 2021.  It has been getting worse recently and he was seen by his primary care physician and MRI of the left shoulder showed large destructive lesion in the glenoid and coracoid process highly suspicious for metastatic lung carcinoma. I personally and independently reviewed the MRI images and discussed the results with the patient today. I recommended for the patient to have repeat PET scan for further evaluation of his disease and to rule out any disease recurrence or metastasis. I will see him back for follow-up visit in 2 weeks for evaluation and more detailed discussion of his treatment options based on the PET scan results. He does not take any pain medication for his left shoulder pain which doesn't bother him unless he moves his arm.  He was advised to take Tylenol or ibuprofen for now if needed. The patient was  advised to call immediately if he has any other concerning symptoms in the interval. The patient voices understanding of current disease status and treatment options and is in agreement with the current care plan. All questions were answered. The patient knows to call the clinic with any problems, questions or concerns. We can certainly see the patient much sooner if necessary.  Disclaimer: This note was dictated with voice recognition software. Similar sounding words can inadvertently be transcribed and may not be corrected upon review.

## 2020-09-13 ENCOUNTER — Telehealth: Payer: Self-pay | Admitting: Internal Medicine

## 2020-09-13 NOTE — Telephone Encounter (Signed)
Scheduled appts per 2/15 los. Pt confirmed appt date and time.

## 2020-09-19 ENCOUNTER — Other Ambulatory Visit: Payer: Self-pay | Admitting: Cardiovascular Disease

## 2020-09-25 ENCOUNTER — Other Ambulatory Visit: Payer: Self-pay

## 2020-09-25 ENCOUNTER — Ambulatory Visit (HOSPITAL_COMMUNITY)
Admission: RE | Admit: 2020-09-25 | Discharge: 2020-09-25 | Disposition: A | Discharge status: Home / Self Care | Payer: BC Managed Care – PPO | Source: Ambulatory Visit | Attending: Internal Medicine | Admitting: Internal Medicine

## 2020-09-25 DIAGNOSIS — C3490 Malignant neoplasm of unspecified part of unspecified bronchus or lung: Secondary | ICD-10-CM | POA: Diagnosis not present

## 2020-09-25 LAB — GLUCOSE, CAPILLARY: Glucose-Capillary: 97 mg/dL (ref 70–99)

## 2020-09-25 MED ORDER — FLUDEOXYGLUCOSE F - 18 (FDG) INJECTION
7.5000 | Freq: Once | INTRAVENOUS | Status: AC | PRN
  Administered 2020-09-25: 7 via INTRAVENOUS

## 2020-09-26 ENCOUNTER — Inpatient Hospital Stay: Payer: BC Managed Care – PPO | Attending: Internal Medicine | Admitting: Internal Medicine

## 2020-09-26 VITALS — BP 138/98 | HR 75 | Temp 97.5°F | Resp 16 | Ht 68.5 in | Wt 141.3 lb

## 2020-09-26 DIAGNOSIS — C770 Secondary and unspecified malignant neoplasm of lymph nodes of head, face and neck: Secondary | ICD-10-CM | POA: Diagnosis not present

## 2020-09-26 DIAGNOSIS — C3411 Malignant neoplasm of upper lobe, right bronchus or lung: Secondary | ICD-10-CM | POA: Insufficient documentation

## 2020-09-26 DIAGNOSIS — C3491 Malignant neoplasm of unspecified part of right bronchus or lung: Secondary | ICD-10-CM

## 2020-09-26 DIAGNOSIS — Z9221 Personal history of antineoplastic chemotherapy: Secondary | ICD-10-CM | POA: Insufficient documentation

## 2020-09-26 DIAGNOSIS — C7951 Secondary malignant neoplasm of bone: Secondary | ICD-10-CM | POA: Insufficient documentation

## 2020-09-26 DIAGNOSIS — G893 Neoplasm related pain (acute) (chronic): Secondary | ICD-10-CM | POA: Insufficient documentation

## 2020-09-26 DIAGNOSIS — I10 Essential (primary) hypertension: Secondary | ICD-10-CM

## 2020-09-26 NOTE — Progress Notes (Signed)
[facility] [phone]   [phone]  OFFICE PROGRESS NOTE  Sean Peng, NP [place] [address]  DIAGNOSIS: Stage IIIA (T3, N1, M0) non-small cell lung cancer, adenocarcinoma with no actionable mutations presented with multiple pulmonary nodules in the right upper lobe as well as metastatic disease in intraparenchymal lymphadenopathy.  This was diagnosed in February 2020.  PRIOR THERAPY: 1) Status post right upper lobectomy with lymph node dissection. 2) Adjuvant systemic chemotherapy with cisplatin 75 mg/M2 and Alimta 500 mg/M2 every 3 weeks.  Status post 4 cycles.  CURRENT THERAPY: Observation.  INTERVAL HISTORY: Sean Rose 65 y.o. male returns to the clinic today for follow-up visit accompanied by his wife Sean Rose.  The patient continues to have pain on the left shoulder area but no significant chest pain, shortness of breath, cough or hemoptysis.  He has no recent weight loss or night sweats.  He has no nausea, vomiting, diarrhea or constipation.  He has no headache or visual changes.  He was found on recent CT scan of the chest to have suspicious lesion in the left shoulder area.  I ordered a PET scan which was performed recently and he is here for evaluation and discussion of his PET scan results and recommendation regarding treatment of his condition.  MEDICAL HISTORY: Past Medical History:  Diagnosis Date  . Anginal pain ([place])   . CAD (coronary artery disease)   . CAP (community acquired pneumonia) 09/2016  . COPD (chronic obstructive pulmonary disease) ([place])   . GERD (gastroesophageal reflux disease)   . Heart murmur    "I was told I had one when I was a kid"  . Hemorrhoids   . History of anal fissures    "no surgeries" (10/30/2016)  . Hyperlipidemia   . Hypertension   . lung ca dx'd 08/2018  . Peripheral arterial disease ([place])    status post right common iliac artery stenting back in 2007  . Seasonal  allergies   . Tobacco abuse     ALLERGIES:  is allergic to tyloxapol.  MEDICATIONS:  Current Outpatient Medications  Medication Sig Dispense Refill  . amLODipine (NORVASC) 10 MG tablet TAKE 1 TABLET(10 MG) BY MOUTH DAILY 90 tablet 3  . aspirin EC 81 MG tablet Take 81 mg by mouth daily.    . clopidogrel (PLAVIX) 75 MG tablet TAKE 1 TABLET BY MOUTH EVERY DAY 90 tablet 3  . diphenhydrAMINE (BENADRYL) 25 mg capsule Take 25 mg by mouth daily as needed for allergies.     Marland Kitchen ibuprofen (ADVIL,MOTRIN) 200 MG tablet Take 400 mg by mouth every 6 (six) hours as needed for headache or moderate pain.     Marland Kitchen MAGNESIUM-OXIDE 400 (241.3 Mg) MG tablet TAKE 1 TABLET BY MOUTH THREE TIMES DAILY 90 tablet 1  . metoprolol succinate (TOPROL-XL) 100 MG 24 hr tablet TAKE 1 TABLET BY MOUTH EVERY DAY 90 tablet 0  . Multiple Vitamin (MULTIVITAMIN WITH MINERALS) TABS tablet Take 1 tablet by mouth daily.    . nitroGLYCERIN (NITROSTAT) 0.4 MG SL tablet PLACE 1 TABLET UNDER THE TONGUE EVERY 5 MINUTES AS NEEDED FOR CHEST PAIN. 25 tablet 6  . rosuvastatin (CRESTOR) 5 MG tablet Take 1 tablet (5 mg total) by mouth every other day. 45 tablet 3  . umeclidinium-vilanterol (ANORO ELLIPTA) 62.5-25 MCG/INH AEPB Inhale 1 puff into the lungs daily. 60 each 5   No current facility-administered medications for this visit.    SURGICAL  HISTORY:  Past Surgical History:  Procedure Laterality Date  . ANKLE SURGERY Left    "rebuilt it"  . ANTERIOR CRUCIATE LIGAMENT REPAIR Right   . CARDIAC CATHETERIZATION  09/23/2008   Continued medical therapy - may need GI evaluation in addition.  Marland Kitchen CARDIAC CATHETERIZATION  10/28/2007   Medical therapy recommended.  Marland Kitchen CARDIAC CATHETERIZATION  11/18/2006   In-stent restenosis RCA  (50% distal edge, 80% segmental mid, and 50-60% segmental proximal). Successful cutting balloon atherectomy using a [id number] cutting balloon. 3 inflations with atherectomy performed on mid and proximal portions resulting in  reduction of 80% mid in-stent restenosis to less than 20% residual and 50-60% segmental proximal to less than 20% residual without dissection.  Marland Kitchen CARDIAC CATHETERIZATION  02/26/2006   Severe stenosis in RCA. Stenting performed using IVUS. 3.5x20 Maverick balloon deployed at [employer]. Distal stent-a 4x28 Liberte stent-deployed 12atm 48sec, 12atm 31sec, 4atm 19sec. Mid stent-a 4x28 Liberte stent-deployed 14atm 45sec, 14atm 60sec, 14atm 44sec. Proximal stent-4x8 Liberte- 14atm 45sec,14atm 47sec, 16atm 43sec. Severely diseased segment then appeared TIMI-3 flow.  Marland Kitchen CARDIOVASCULAR STRESS TEST  11/17/2012   No significant ECG changes. Septal perfusion defect is new when complared to study from 2010. Abnormal myocardial perfusion imaging with a basal to mid perfusion suggestive of previous MI.  Marland Kitchen CAROTID DOPPLER  08/09/2011   Bilateral Bulb/Proximal ICA - demonstrated a mild amount of fibrous plaque without evidence of significant diameter reduction reduction or other vascular abnormality.  . CHEST TUBE INSERTION Right 09/02/2018   Procedure: Chest Tube Insertion;  Surgeon: Garner Nash, DO;  Location: [facility];  Service: Thoracic;  Laterality: Right;  . COLONOSCOPY     2003, 2014  . CORONARY ANGIOPLASTY WITH STENT PLACEMENT    . FEMORAL ARTERY STENT    . INGUINAL HERNIA REPAIR Right   . KNEE ARTHROSCOPY Right "multiple"  . LAPAROSCOPIC APPENDECTOMY N/A 12/07/2018   Procedure: APPENDECTOMY LAPAROSCOPIC;  Surgeon: Coralie Keens, MD;  Location: [facility];  Service: General;  Laterality: N/A;  . LOWER EXTREMITY ARTERIAL DOPPLER  01/31/2011   Bilateral ABIs-normal values with no suggestion of arterial insuff to the lower extremities at rest. Right CIA stent-mild amount of nonhemodynamically significant plaque is noted throughout  . 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 PERCUTANEOUS STENT INTERVENTION  04/04/2006 & 04/13/2015   a. Right common iliac artery with an 8.0x18 mm Herculink stent deployed at 12  atm. Stenosis was reduced from 80% to 0% with brisk flow. b. I-cast stenting to left common iliac artery  . PERIPHERAL VASCULAR CATHETERIZATION N/A 04/13/2015   Procedure: Lower Extremity Angiography;  Surgeon: Lorretta Harp, MD; L-oCIA 75%, 40-50% L-EIA, R-CIA stent patent, s/p 8 mm x 38 mm ICast covered stent>>0% stenosis in L-oCIA     . SHOULDER ARTHROSCOPY WITH ROTATOR CUFF REPAIR Right   . TRANSTHORACIC ECHOCARDIOGRAM  11/26/2012   EF not noted. Aortic valve-sclerosis without stenosis, no regurgiation.   Marland Kitchen UPPER GASTROINTESTINAL ENDOSCOPY    . US CAROTID DOPPLER BILATERAL ([place] HX)  08/09/2011   Bilateral Bulb/Proximal ICAa demonstrated a mild amount of fibrous plaque without evidence of significant diameter reduction or any other vascular abnormality.  Marland Kitchen VIDEO ASSISTED THORACOSCOPY (VATS)/ LOBECTOMY Right 09/14/2018   Procedure: RIGHT VIDEO ASSISTED THORACOSCOPY (VATS)/ RIGHT UPPER LOBECTOMY;  Surgeon: Melrose Nakayama, MD;  Location: [facility];  Service: Thoracic;  Laterality: Right;  Marland Kitchen VIDEO BRONCHOSCOPY WITH ENDOBRONCHIAL NAVIGATION N/A 09/02/2018   Procedure: VIDEO BRONCHOSCOPY WITH ENDOBRONCHIAL NAVIGATION;  Surgeon: June Leap  L, DO;  Location: [facility];  Service: Thoracic;  Laterality: N/A;    REVIEW OF SYSTEMS:  Constitutional: positive for fatigue Eyes: negative Ears, nose, mouth, throat, and face: negative Respiratory: negative Cardiovascular: negative Gastrointestinal: negative Genitourinary:negative Integument/breast: negative Hematologic/lymphatic: negative Musculoskeletal:positive for bone pain Neurological: negative Behavioral/Psych: negative Endocrine: negative Allergic/Immunologic: negative   PHYSICAL EXAMINATION: General appearance: alert, cooperative, fatigued and no distress Head: Normocephalic, without obvious abnormality, atraumatic Neck: no adenopathy, no JVD, supple, symmetrical, trachea midline and thyroid not enlarged, symmetric, no  tenderness/mass/nodules Lymph nodes: Cervical, supraclavicular, and axillary nodes normal. Resp: clear to auscultation bilaterally Back: symmetric, no curvature. ROM normal. No CVA tenderness. Cardio: regular rate and rhythm, S1, S2 normal, no murmur, click, rub or gallop GI: soft, non-tender; bowel sounds normal; no masses,  no organomegaly Extremities: extremities normal, atraumatic, no cyanosis or edema Neurologic: Alert and oriented X 3, normal strength and tone. Normal symmetric reflexes. Normal coordination and gait  ECOG PERFORMANCE STATUS: 1 - Symptomatic but completely ambulatory  Blood pressure (!) 138/98, pulse 75, temperature (!) 97.5 F (36.4 C), temperature source Tympanic, resp. rate 16, height 5' 8.5" (1.74 m), weight 141 lb 4.8 oz (64.1 kg), SpO2 100 %.  LABORATORY DATA: Lab Results  Component Value Date   WBC 7.4 06/19/2020   HGB 15.6 06/19/2020   HCT 43.2 06/19/2020   MCV 96.0 06/19/2020   PLT 341 06/19/2020      Chemistry      Component Value Date/Time   NA 130 (L) 06/19/2020 1104   NA 130 (L) 03/05/2017 1110   K 4.6 06/19/2020 1104   CL 97 (L) 06/19/2020 1104   CO2 23 06/19/2020 1104   BUN <4 (L) 06/19/2020 1104   BUN 4 (L) 03/05/2017 1110   CREATININE 0.83 06/19/2020 1104   CREATININE 0.76 04/28/2020 0826      Component Value Date/Time   CALCIUM 9.8 06/19/2020 1104   ALKPHOS 120 08/25/2020 1201   AST 48 (H) 08/25/2020 1201   AST 41 06/19/2020 1104   ALT 26 08/25/2020 1201   ALT 22 06/19/2020 1104   BILITOT 0.7 08/25/2020 1201   BILITOT 0.6 06/19/2020 1104       RADIOGRAPHIC STUDIES: NM PET Image Restag (PS) Skull Base To Thigh  Result Date: 09/25/2020 CLINICAL DATA:  Subsequent treatment strategy for lung cancer, possible recurrence. EXAM: NUCLEAR MEDICINE PET SKULL BASE TO THIGH TECHNIQUE: 7.0 mCi F-18 FDG was injected intravenously. Full-ring PET imaging was performed from the skull base to thigh after the radiotracer. CT data was obtained  and used for attenuation correction and anatomic localization. Fasting blood glucose: 97 mg/dl COMPARISON:  CT chest 06/19/2020. Images from PET 07/24/2018 are not available but report reviewed. FINDINGS: Mediastinal blood pool activity: SUV max 2.1 Liver activity: SUV max NA NECK: No abnormal hypermetabolism. Incidental CT findings: None. CHEST: Hypermetabolic posterior left supraclavicular lymph nodes measure up to 8 mm (4/47) with an SUV max of 7.8. No additional hypermetabolic mediastinal, hilar or axillary lymph nodes. No hypermetabolic nodules. Incidental CT findings: Atherosclerotic calcification of the aorta, aortic valve and coronary arteries. Heart is mildly enlarged. No pericardial or pleural effusion. Centrilobular emphysema. Postoperative scarring in the right hemithorax. Tiny left upper lobe nodules are too small for PET resolution but appear similar to 06/19/2020. ABDOMEN/PELVIS: No abnormal hypermetabolism in the liver, adrenal glands, spleen or pancreas. No hypermetabolic lymph nodes. Incidental CT findings: Liver, gallbladder, adrenal glands and right kidney are unremarkable. Parenchymal calcification in the left kidney. Visualized portions of  the spleen and pancreas are unremarkable. There is gastric wall thickening, greater than expected for suboptimal distension. Stomach and bowel are otherwise grossly unremarkable. Slight bladder wall irregularity may be due to an element of outlet obstruction. Atherosclerotic calcification of the aorta. SKELETON: Intense hypermetabolism in association with lytic destruction of the lateral left scapula, extending to the glenoid and coracoid process, SUV max 23.2. No additional osseous hypermetabolism. Focal hypermetabolism within the subcutaneous fat of the ventral right forearm is likely related to injection. Incidental CT findings: Degenerative changes in the spine. IMPRESSION: 1. Metastatic disease involving the left scapula and left supraclavicular lymph  nodes. 2. Persistent gastric wall thickening. 3. Aortic atherosclerosis (ICD10-I70.0). Coronary artery calcification. 4.  Emphysema (ICD10-J43.9). Electronically Signed   By: Lorin Picket M.D.   On: 09/25/2020 09:05    ASSESSMENT AND PLAN: This is a very pleasant 65 years old white male with a stage IIIB non-small cell lung cancer, adenocarcinoma with no actionable mutations status post right upper lobectomy with lymph node dissection under the care of [name]. The patient completed a course of adjuvant treatment with cisplatin and Alimta status post 4 cycles.  He tolerated the previous 4 cycles of his treatment fairly well. The patient has been in observation but the recent imaging studies showed suspicious bone lesion in the left shoulder area. I ordered a PET scan which was performed on 09/25/2020 and it showed metastatic disease involving the left scapula and left supraclavicular lymph nodes. I personally and independently reviewed the scan images and discussed the result and showed the images to the patient and his wife. I recommended for the patient to proceed with ultrasound-guided core biopsy of the hypermetabolic supraclavicular lymph node for confirmation of tissue diagnosis.  If the lymph node is too small to be accessible, we will consider the patient for biopsy of the lytic lesion in the left scapula. For the painful metastatic bone lesion in the left scapula, I will refer the patient to radiation oncology for palliative radiotherapy. I will arrange for the patient to come back for follow-up visit in 1 months for evaluation and more detailed discussion of his systemic treatment options after the tissue diagnosis and completion of the radiotherapy. The patient was advised to call immediately if he has any other concerning symptoms in the interval. The patient voices understanding of current disease status and treatment options and is in agreement with the current care plan. All  questions were answered. The patient knows to call the clinic with any problems, questions or concerns. We can certainly see the patient much sooner if necessary.  Disclaimer: This note was dictated with voice recognition software. Similar sounding words can inadvertently be transcribed and may not be corrected upon review.

## 2020-09-27 ENCOUNTER — Other Ambulatory Visit: Payer: Self-pay

## 2020-09-27 ENCOUNTER — Ambulatory Visit
Admission: RE | Admit: 2020-09-27 | Discharge: 2020-09-27 | Disposition: A | Discharge status: Home / Self Care | Payer: BC Managed Care – PPO | Source: Ambulatory Visit | Attending: Radiation Oncology | Admitting: Radiation Oncology

## 2020-09-27 ENCOUNTER — Telehealth: Payer: Self-pay | Admitting: Internal Medicine

## 2020-09-27 VITALS — BP 136/80 | HR 62 | Temp 97.0°F | Resp 18 | Ht 68.5 in | Wt 141.0 lb

## 2020-09-27 DIAGNOSIS — J439 Emphysema, unspecified: Secondary | ICD-10-CM | POA: Diagnosis not present

## 2020-09-27 DIAGNOSIS — I2510 Atherosclerotic heart disease of native coronary artery without angina pectoris: Secondary | ICD-10-CM | POA: Insufficient documentation

## 2020-09-27 DIAGNOSIS — C3411 Malignant neoplasm of upper lobe, right bronchus or lung: Secondary | ICD-10-CM | POA: Diagnosis not present

## 2020-09-27 DIAGNOSIS — C771 Secondary and unspecified malignant neoplasm of intrathoracic lymph nodes: Secondary | ICD-10-CM | POA: Insufficient documentation

## 2020-09-27 DIAGNOSIS — C3491 Malignant neoplasm of unspecified part of right bronchus or lung: Secondary | ICD-10-CM | POA: Diagnosis not present

## 2020-09-27 DIAGNOSIS — I700 Atherosclerosis of aorta: Secondary | ICD-10-CM | POA: Diagnosis not present

## 2020-09-27 DIAGNOSIS — I10 Essential (primary) hypertension: Secondary | ICD-10-CM | POA: Insufficient documentation

## 2020-09-27 DIAGNOSIS — E785 Hyperlipidemia, unspecified: Secondary | ICD-10-CM | POA: Diagnosis not present

## 2020-09-27 DIAGNOSIS — Z8781 Personal history of (healed) traumatic fracture: Secondary | ICD-10-CM | POA: Insufficient documentation

## 2020-09-27 DIAGNOSIS — C7951 Secondary malignant neoplasm of bone: Secondary | ICD-10-CM

## 2020-09-27 DIAGNOSIS — Z800 Family history of malignant neoplasm of digestive organs: Secondary | ICD-10-CM | POA: Insufficient documentation

## 2020-09-27 DIAGNOSIS — Z7982 Long term (current) use of aspirin: Secondary | ICD-10-CM | POA: Diagnosis not present

## 2020-09-27 DIAGNOSIS — I739 Peripheral vascular disease, unspecified: Secondary | ICD-10-CM | POA: Diagnosis not present

## 2020-09-27 DIAGNOSIS — K219 Gastro-esophageal reflux disease without esophagitis: Secondary | ICD-10-CM | POA: Insufficient documentation

## 2020-09-27 DIAGNOSIS — Z79899 Other long term (current) drug therapy: Secondary | ICD-10-CM | POA: Insufficient documentation

## 2020-09-27 DIAGNOSIS — R011 Cardiac murmur, unspecified: Secondary | ICD-10-CM | POA: Insufficient documentation

## 2020-09-27 DIAGNOSIS — Z87891 Personal history of nicotine dependence: Secondary | ICD-10-CM | POA: Insufficient documentation

## 2020-09-27 DIAGNOSIS — G893 Neoplasm related pain (acute) (chronic): Secondary | ICD-10-CM | POA: Diagnosis not present

## 2020-09-27 NOTE — Progress Notes (Signed)
Radiation Oncology         [phone] ________________________________  Initial Outpatient Consultation  Name: Sean Rose MRN: [id number]  Date: 09/27/2020  DOB: 1955-10-05  IR:SWNIOEVO, Sean Rumps, NP  Sean Bears, MD   REFERRING PHYSICIAN: [name], MD  DIAGNOSIS:    ICD-10-CM   1. Bone metastases (HCC)  C79.51   2. Bone metastasis ([place])  C79.51    STAGE IV LUNG CANCER  CHIEF COMPLAINT: Here to discuss management of metastatic lung cancer  HISTORY OF PRESENT ILLNESS::Sean Rose is a 65 y.o. male who has been under observation after being treated for stage IIIa (T3, N1) non-small cell carcinoma with chemotherapy and right upper lobectomy in 2020.  In most recent history, the patient underwent a PET scan on 09/25/2020 for evaluation of prolonged left shoulder pain. Results showed metastatic disease involving the left scapula and left supraclavicular lymph nodes. There was also noted to be persistent gastric wall thickening.  The patient was last seen by Dr. Julien Rose on 09/26/2020, at which time it was recommended that he proceed with ultrasound-guided core biopsy of the hypermetabolic supraclavicular lymph node for confirmation of tissue diagnosis. If the lymph node is too small to be accessible, then the lytic lesion in the left scapula would be considered for biopsy. He has been referred to radiation oncology for consideration of palliative radiotherapy to the painful metastatic bone lesion in the left scapula.  Histology and Location of Primary Cancer:  Stage IIIA (T3, N1, M0) non-small cell lung cancer, adenocarcinoma  Sites of Visceral and Bony Metastatic Disease:  PET Scan  09/25/2020 --IMPRESSION: 1. Metastatic disease involving the left scapula and left supraclavicular lymph nodes. 2. Persistent gastric wall thickening. 3. Aortic atherosclerosis (ICD10-I70.0). Coronary artery calcification. 4.  Emphysema (ICD10-J43.9).  Location(s) of Symptomatic  Metastases:  Left shoulder that radiates down upper arm. Reports some limitation in range of motion to left arm, and frequently has to use his right arm to assist in moving his left arm  Past treatment: 1) Status post right upper lobectomy with lymph node dissection (Dr. Roxan Rose) 2) Adjuvant systemic chemotherapy with cisplatin 75 mg/M2 and Alimta 500 mg/M2 every 3 weeks.  Status post 4 cycles.   Pain on a scale of 0-10 is:  Denies any pain when left arm is at rest. However, reports significant pain with activity and states the higher he tries to lift his arm, the more intense the pain becomes/radiates. Also reports discomfort to the posterior, middle of his neck. States he saw an orthopedic doctor to evaluate, and was found to have old trauma to the area  Ambulatory status? Walker? Wheelchair?: Patient is able to ambulate unassisted  SAFETY ISSUES:  Prior radiation? No  Pacemaker/ICD? No  Possible current pregnancy? N/A  Is the patient on methotrexate? No  Current Complaints / other details:  Patient has received both Advertising copywriter booster (though unsure of date for booster).   He used to be an avid runner. He reports his family is very supportive.  PREVIOUS RADIATION THERAPY: No  PAST MEDICAL HISTORY:  has a past medical history of Anginal pain ([place]), CAD (coronary artery disease), CAP (community acquired pneumonia) (09/2016), COPD (chronic obstructive pulmonary disease) ([place]), GERD (gastroesophageal reflux disease), Heart murmur, Hemorrhoids, History of anal fissures, Hyperlipidemia, Hypertension, lung ca (dx'd 08/2018), Peripheral arterial disease ([place]), Seasonal allergies, and Tobacco abuse.    PAST SURGICAL HISTORY: Past Surgical History:  Procedure Laterality Date  . ANKLE SURGERY Left    "rebuilt  it"  . ANTERIOR CRUCIATE LIGAMENT REPAIR Right   . CARDIAC CATHETERIZATION  09/23/2008   Continued medical therapy - may need GI evaluation in addition.  Marland Kitchen CARDIAC  CATHETERIZATION  10/28/2007   Medical therapy recommended.  Marland Kitchen CARDIAC CATHETERIZATION  11/18/2006   In-stent restenosis RCA  (50% distal edge, 80% segmental mid, and 50-60% segmental proximal). Successful cutting balloon atherectomy using a [id number] cutting balloon. 3 inflations with atherectomy performed on mid and proximal portions resulting in reduction of 80% mid in-stent restenosis to less than 20% residual and 50-60% segmental proximal to less than 20% residual without dissection.  Marland Kitchen CARDIAC CATHETERIZATION  02/26/2006   Severe stenosis in RCA. Stenting performed using IVUS. 3.5x20 Maverick balloon deployed at [employer]. Distal stent-a 4x28 Liberte stent-deployed 12atm 48sec, 12atm 31sec, 4atm 19sec. Mid stent-a 4x28 Liberte stent-deployed 14atm 45sec, 14atm 60sec, 14atm 44sec. Proximal stent-4x8 Liberte- 14atm 45sec,14atm 47sec, 16atm 43sec. Severely diseased segment then appeared TIMI-3 flow.  Marland Kitchen CARDIOVASCULAR STRESS TEST  11/17/2012   No significant ECG changes. Septal perfusion defect is new when complared to study from 2010. Abnormal myocardial perfusion imaging with a basal to mid perfusion suggestive of previous MI.  Marland Kitchen CAROTID DOPPLER  08/09/2011   Bilateral Bulb/Proximal ICA - demonstrated a mild amount of fibrous plaque without evidence of significant diameter reduction reduction or other vascular abnormality.  . CHEST TUBE INSERTION Right 09/02/2018   Procedure: Chest Tube Insertion;  Surgeon: Garner Nash, DO;  Location: [facility];  Service: Thoracic;  Laterality: Right;  . COLONOSCOPY     2003, 2014  . CORONARY ANGIOPLASTY WITH STENT PLACEMENT    . FEMORAL ARTERY STENT    . INGUINAL HERNIA REPAIR Right   . KNEE ARTHROSCOPY Right "multiple"  . LAPAROSCOPIC APPENDECTOMY N/A 12/07/2018   Procedure: APPENDECTOMY LAPAROSCOPIC;  Surgeon: Coralie Keens, MD;  Location: [facility];  Service: General;  Laterality: N/A;  . LOWER EXTREMITY ARTERIAL DOPPLER  01/31/2011   Bilateral ABIs-normal values  with no suggestion of arterial insuff to the lower extremities at rest. Right CIA stent-mild amount of nonhemodynamically significant plaque is noted throughout  . 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 PERCUTANEOUS STENT INTERVENTION  04/04/2006 & 04/13/2015   a. Right common iliac artery with an 8.0x18 mm Herculink stent deployed at 12 atm. Stenosis was reduced from 80% to 0% with brisk flow. b. I-cast stenting to left common iliac artery  . PERIPHERAL VASCULAR CATHETERIZATION N/A 04/13/2015   Procedure: Lower Extremity Angiography;  Surgeon: Lorretta Harp, MD; L-oCIA 75%, 40-50% L-EIA, R-CIA stent patent, s/p 8 mm x 38 mm ICast covered stent>>0% stenosis in L-oCIA     . SHOULDER ARTHROSCOPY WITH ROTATOR CUFF REPAIR Right   . TRANSTHORACIC ECHOCARDIOGRAM  11/26/2012   EF not noted. Aortic valve-sclerosis without stenosis, no regurgiation.   Marland Kitchen UPPER GASTROINTESTINAL ENDOSCOPY    . US CAROTID DOPPLER BILATERAL ([place] HX)  08/09/2011   Bilateral Bulb/Proximal ICAa demonstrated a mild amount of fibrous plaque without evidence of significant diameter reduction or any other vascular abnormality.  Marland Kitchen VIDEO ASSISTED THORACOSCOPY (VATS)/ LOBECTOMY Right 09/14/2018   Procedure: RIGHT VIDEO ASSISTED THORACOSCOPY (VATS)/ RIGHT UPPER LOBECTOMY;  Surgeon: Melrose Nakayama, MD;  Location: [facility];  Service: Thoracic;  Laterality: Right;  Marland Kitchen VIDEO BRONCHOSCOPY WITH ENDOBRONCHIAL NAVIGATION N/A 09/02/2018   Procedure: VIDEO BRONCHOSCOPY WITH ENDOBRONCHIAL NAVIGATION;  Surgeon: Garner Nash, DO;  Location: [facility];  Service: Thoracic;  Laterality: N/A;    FAMILY HISTORY: family  history includes Colon cancer in his mother; Heart disease in his father and paternal grandfather.  SOCIAL HISTORY:  reports that he quit smoking about 1 years ago. His smoking use included cigarettes. He has a 9.50 pack-year smoking history. He has never used smokeless tobacco. He reports current alcohol use. He reports that he  does not use drugs.  ALLERGIES: Patient has no active allergies.  MEDICATIONS:  Current Outpatient Medications  Medication Sig Dispense Refill  . amLODipine (NORVASC) 10 MG tablet TAKE 1 TABLET(10 MG) BY MOUTH DAILY 90 tablet 3  . aspirin EC 81 MG tablet Take 81 mg by mouth daily.    . clopidogrel (PLAVIX) 75 MG tablet TAKE 1 TABLET BY MOUTH EVERY DAY 90 tablet 3  . diphenhydrAMINE (BENADRYL) 25 mg capsule Take 25 mg by mouth daily as needed for allergies.     Marland Kitchen ibuprofen (ADVIL,MOTRIN) 200 MG tablet Take 400 mg by mouth every 6 (six) hours as needed for headache or moderate pain.     Marland Kitchen MAGNESIUM-OXIDE 400 (241.3 Mg) MG tablet TAKE 1 TABLET BY MOUTH THREE TIMES DAILY 90 tablet 1  . metoprolol succinate (TOPROL-XL) 100 MG 24 hr tablet TAKE 1 TABLET BY MOUTH EVERY DAY 90 tablet 0  . Multiple Vitamin (MULTIVITAMIN WITH MINERALS) TABS tablet Take 1 tablet by mouth daily.    . nitroGLYCERIN (NITROSTAT) 0.4 MG SL tablet PLACE 1 TABLET UNDER THE TONGUE EVERY 5 MINUTES AS NEEDED FOR CHEST PAIN. 25 tablet 6  . rosuvastatin (CRESTOR) 5 MG tablet Take 1 tablet (5 mg total) by mouth every other day. 45 tablet 3  . umeclidinium-vilanterol (ANORO ELLIPTA) 62.5-25 MCG/INH AEPB Inhale 1 puff into the lungs daily. 60 each 5   No current facility-administered medications for this encounter.    REVIEW OF SYSTEMS:  Notable for that above.   PHYSICAL EXAM:  height is 5' 8.5" (1.74 m) and weight is 141 lb (64 kg). His temporal temperature is 97 F (36.1 C) (abnormal). His blood pressure is 136/80 and his pulse is 62. His respiration is 18 and oxygen saturation is 100%.   General: Alert and oriented, in no acute distress  Heart: Regular in rate and rhythm with no murmurs, rubs, or gallops. Chest: Clear to auscultation bilaterally, with no rhonchi, wheezes, or rales. Lymphatics: no palpable adenopathy in the cervical or SCV neck Musculoskeletal: he cannot raise his left arm due to pain Psychiatric: Judgment  and insight are intact. Affect is appropriate.   ECOG = 1  0 - Asymptomatic (Fully active, able to carry on all predisease activities without restriction)  1 - Symptomatic but completely ambulatory (Restricted in physically strenuous activity but ambulatory and able to carry out work of a light or sedentary nature. For example, light housework, office work)  2 - Symptomatic, <50% in bed during the day (Ambulatory and capable of all self care but unable to carry out any work activities. Up and about more than 50% of waking hours)  3 - Symptomatic, >50% in bed, but not bedbound (Capable of only limited self-care, confined to bed or chair 50% or more of waking hours)  4 - Bedbound (Completely disabled. Cannot carry on any self-care. Totally confined to bed or chair)  5 - Death   Eustace Pen MM, Creech RH, Tormey DC, et al. [phone]). "Toxicity and response criteria of the [facility]". [place] Oncol. 5 (6): 649-55   LABORATORY DATA:  Lab Results  Component Value Date   WBC 7.4 06/19/2020  HGB 15.6 06/19/2020   HCT 43.2 06/19/2020   MCV 96.0 06/19/2020   PLT 341 06/19/2020   CMP     Component Value Date/Time   NA 130 (L) 06/19/2020 1104   NA 130 (L) 03/05/2017 1110   K 4.6 06/19/2020 1104   CL 97 (L) 06/19/2020 1104   CO2 23 06/19/2020 1104   GLUCOSE 95 06/19/2020 1104   BUN <4 (L) 06/19/2020 1104   BUN 4 (L) 03/05/2017 1110   CREATININE 0.83 06/19/2020 1104   CREATININE 0.76 04/28/2020 0826   CALCIUM 9.8 06/19/2020 1104   PROT 7.7 08/25/2020 1201   ALBUMIN 4.6 08/25/2020 1201   AST 48 (H) 08/25/2020 1201   AST 41 06/19/2020 1104   ALT 26 08/25/2020 1201   ALT 22 06/19/2020 1104   ALKPHOS 120 08/25/2020 1201   BILITOT 0.7 08/25/2020 1201   BILITOT 0.6 06/19/2020 1104   GFRNONAA >60 06/19/2020 1104   GFRAA >60 12/17/2019 1158         RADIOGRAPHY - I personally reviewed his images: NM PET Image Restag (PS) Skull Base To Thigh  Result Date:  09/25/2020 CLINICAL DATA:  Subsequent treatment strategy for lung cancer, possible recurrence. EXAM: NUCLEAR MEDICINE PET SKULL BASE TO THIGH TECHNIQUE: 7.0 mCi F-18 FDG was injected intravenously. Full-ring PET imaging was performed from the skull base to thigh after the radiotracer. CT data was obtained and used for attenuation correction and anatomic localization. Fasting blood glucose: 97 mg/dl COMPARISON:  CT chest 06/19/2020. Images from PET 07/24/2018 are not available but report reviewed. FINDINGS: Mediastinal blood pool activity: SUV max 2.1 Liver activity: SUV max NA NECK: No abnormal hypermetabolism. Incidental CT findings: None. CHEST: Hypermetabolic posterior left supraclavicular lymph nodes measure up to 8 mm (4/47) with an SUV max of 7.8. No additional hypermetabolic mediastinal, hilar or axillary lymph nodes. No hypermetabolic nodules. Incidental CT findings: Atherosclerotic calcification of the aorta, aortic valve and coronary arteries. Heart is mildly enlarged. No pericardial or pleural effusion. Centrilobular emphysema. Postoperative scarring in the right hemithorax. Tiny left upper lobe nodules are too small for PET resolution but appear similar to 06/19/2020. ABDOMEN/PELVIS: No abnormal hypermetabolism in the liver, adrenal glands, spleen or pancreas. No hypermetabolic lymph nodes. Incidental CT findings: Liver, gallbladder, adrenal glands and right kidney are unremarkable. Parenchymal calcification in the left kidney. Visualized portions of the spleen and pancreas are unremarkable. There is gastric wall thickening, greater than expected for suboptimal distension. Stomach and bowel are otherwise grossly unremarkable. Slight bladder wall irregularity may be due to an element of outlet obstruction. Atherosclerotic calcification of the aorta. SKELETON: Intense hypermetabolism in association with lytic destruction of the lateral left scapula, extending to the glenoid and coracoid process, SUV max  23.2. No additional osseous hypermetabolism. Focal hypermetabolism within the subcutaneous fat of the ventral right forearm is likely related to injection. Incidental CT findings: Degenerative changes in the spine. IMPRESSION: 1. Metastatic disease involving the left scapula and left supraclavicular lymph nodes. 2. Persistent gastric wall thickening. 3. Aortic atherosclerosis (ICD10-I70.0). Coronary artery calcification. 4.  Emphysema (ICD10-J43.9). Electronically Signed   By: Lorin Picket M.D.   On: 09/25/2020 09:05      IMPRESSION/PLAN: Metastatic lung cancer  Today, I talked to the patient about the findings and work-up thus far. We discussed the patient's diagnosis of presumed metastatic lung cancer to the left scapula and regional node(s) and general treatment for this, highlighting the role of radiotherapy in the management. We discussed the available radiation techniques, and  focused on the details of logistics and delivery.  I recommend 2 weeks of radiotherapy with palliative intent.  We can plan this next week and hold treatment until his biopsy is complete. I personally conferred with Dr Sean Rose about this plan and he is in agreement.  We discussed the risks, benefits, and side effects of radiotherapy. Side effects may include but not necessarily be limited to: skin irritation, fatigue, soft tissue or bone injury. No guarantees of treatment were given. A consent form was signed and placed in the patient's medical record.  The patient was encouraged to ask questions that I answered to the best of my ability. He is enthusiastic to proceed.   On date of service, in total, I spent 45 minutes on this encounter. Patient was seen in person.  __________________________________________   Eppie Gibson, MD  This document serves as a record of services personally performed by Eppie Gibson, MD. It was created on his behalf by Clerance Lav, a trained medical scribe. The creation of this record  is based on the scribe's personal observations and the provider's statements to them. This document has been checked and approved by the attending provider.

## 2020-09-27 NOTE — Progress Notes (Signed)
Histology and Location of Primary Cancer:  Stage IIIA (T3, N1, M0) non-small cell lung cancer, adenocarcinoma  Sites of Visceral and Bony Metastatic Disease:  PET Scan  09/25/2020 --IMPRESSION: 1. Metastatic disease involving the left scapula and left supraclavicular lymph nodes. 2. Persistent gastric wall thickening. 3. Aortic atherosclerosis (ICD10-I70.0). Coronary artery calcification. 4.  Emphysema (ICD10-J43.9).  Location(s) of Symptomatic Metastases:  Left shoulder that radiates down upper arm. Reports some limitation in range of motion to left arm, and frequently has to use his right arm to assist in moving his left arm  Past/Anticipated chemotherapy by medical oncology, if any:  Under care of Dr. [name] 09/26/2020 1) Status post right upper lobectomy with lymph node dissection ([name]) 2) Adjuvant systemic chemotherapy with cisplatin 75 mg/M2 and Alimta 500 mg/M2 every 3 weeks.  Status post 4 cycles. --The patient has been in observation but the recent imaging studies showed suspicious bone lesion in the left shoulder area. --I ordered a PET scan which was performed on 09/25/2020 and it showed metastatic disease involving the left scapula and left supraclavicular lymph nodes --I recommended for the patient to proceed with ultrasound-guided core biopsy of the hypermetabolic supraclavicular lymph node for confirmation of tissue diagnosis.  If the lymph node is too small to be accessible, we will consider the patient for biopsy of the lytic lesion in the left scapula. --For the painful metastatic bone lesion in the left scapula, I will refer the patient to radiation oncology for palliative radiotherapy. --I will arrange for the patient to come back for follow-up visit in 1 months for evaluation and more detailed discussion of his systemic treatment options after the tissue diagnosis and completion of the radiotherapy. --The patient was advised to call immediately if he  has any other concerning symptoms in the interval.  Pain on a scale of 0-10 is:  Denies any pain when left arm is at rest. However, reports significant pain with activity and states the higher he tries to lift his arm, the more intense the pain becomes/radiates. Also reports discomfort to the posterior, middle of his neck. States he saw an orthopedic doctor to evaluate, and was found to have old trauma to the area  Ambulatory status? Walker? Wheelchair?: Patient is able to ambulate unassisted  SAFETY ISSUES:  Prior radiation? No  Pacemaker/ICD? No  Possible current pregnancy? N/A  Is the patient on methotrexate? No  Current Complaints / other details:  Patient has received both Advertising copywriter booster (though unsure of date for booster).

## 2020-09-27 NOTE — Telephone Encounter (Signed)
Scheduled per los. Called and left msg. Mailed printout  °

## 2020-09-28 ENCOUNTER — Encounter (HOSPITAL_COMMUNITY): Payer: Self-pay

## 2020-09-28 ENCOUNTER — Telehealth (HOSPITAL_COMMUNITY): Payer: BC Managed Care – PPO

## 2020-09-28 NOTE — Progress Notes (Unsigned)
AH     2       Sean Rose, 65 y.o., 12-01-1955  MRN:  [id number] Phone:  [phone] [name])       PCP:  [name], NP Coverage:  Sherre Poot [employer]  Next Appt With Radiation Oncology 10/04/2020 at 1:30 PM           RE: Biopsy Received: Yesterday  Message Details  [name], MD  [name], RN Yes. Okay to hold it. Thank you.    Previous Messages  ----- Message -----  From: [name]  Sent: 09/27/2020  1:58 PM EST  To: [name], MD, [name], RN  Subject: FW: Biopsy                    Hello   Pt. Carver is on the blood thinner plavix  He would need to hold it five days prior to his biopsy  Permission is needed   [name]  ----- Message -----  From: [name], MD  Sent: 09/27/2020 12:41 PM EST  To: [name]  Subject: RE: Biopsy                    OK for US guided L supraclavicular LN Bx.   PET CT - image 47, series 4.   Sedation per pt request.   [name]    ----- Message -----  From: [name]  Sent: 09/27/2020 10:32 AM EST  To: [name]  Subject: Biopsy                      Procedure Requested: Ultrasound-guided core biopsy of   Reason for Procedure: hypermetabolic left supraclavicular lymph node    Provider Requesting: [name]  Provider Telephone: [phone]    Other Info:

## 2020-09-28 NOTE — Telephone Encounter (Signed)
Per [name], pt notified to stop Plavix five  days before biopsy.

## 2020-10-01 ENCOUNTER — Encounter: Payer: Self-pay | Admitting: Radiation Oncology

## 2020-10-01 DIAGNOSIS — C7951 Secondary malignant neoplasm of bone: Secondary | ICD-10-CM | POA: Insufficient documentation

## 2020-10-04 ENCOUNTER — Ambulatory Visit
Admission: RE | Admit: 2020-10-04 | Discharge: 2020-10-04 | Disposition: A | Discharge status: Home / Self Care | Payer: BC Managed Care – PPO | Source: Ambulatory Visit | Attending: Radiation Oncology | Admitting: Radiation Oncology

## 2020-10-04 DIAGNOSIS — C3491 Malignant neoplasm of unspecified part of right bronchus or lung: Secondary | ICD-10-CM | POA: Diagnosis not present

## 2020-10-04 DIAGNOSIS — C7951 Secondary malignant neoplasm of bone: Secondary | ICD-10-CM | POA: Insufficient documentation

## 2020-10-04 DIAGNOSIS — G893 Neoplasm related pain (acute) (chronic): Secondary | ICD-10-CM | POA: Diagnosis not present

## 2020-10-04 DIAGNOSIS — Z510 Encounter for antineoplastic radiation therapy: Secondary | ICD-10-CM | POA: Insufficient documentation

## 2020-10-04 DIAGNOSIS — C3411 Malignant neoplasm of upper lobe, right bronchus or lung: Secondary | ICD-10-CM | POA: Insufficient documentation

## 2020-10-05 ENCOUNTER — Other Ambulatory Visit: Payer: Self-pay | Admitting: Student

## 2020-10-05 ENCOUNTER — Telehealth: Payer: Self-pay | Admitting: Medical Oncology

## 2020-10-05 NOTE — Telephone Encounter (Signed)
CT biopsy tomorrow-Instructions reiterated'  Returned wife's call ."No one has given [place] instructions for his biopsy for tomorrow. We don't know who the doctor is ".  I called and LVM to her and to pt that Wes was contacted 3/3 with all his prep instructions and I repeated them .   Pt called back and said he did get some instructions and asked if they would be on mychart and was told yes. When he looked at [place] it said to arrive at 1130 .  He asked if he  was going to get sedation and I told him I don't know.  He said the hospital was calling during our conversation so we hung up the phones.

## 2020-10-06 ENCOUNTER — Other Ambulatory Visit: Payer: Self-pay

## 2020-10-06 ENCOUNTER — Ambulatory Visit (HOSPITAL_COMMUNITY)
Admission: RE | Admit: 2020-10-06 | Discharge: 2020-10-06 | Disposition: A | Discharge status: Home / Self Care | Payer: BC Managed Care – PPO | Source: Ambulatory Visit | Attending: Internal Medicine | Admitting: Internal Medicine

## 2020-10-06 DIAGNOSIS — C770 Secondary and unspecified malignant neoplasm of lymph nodes of head, face and neck: Secondary | ICD-10-CM | POA: Insufficient documentation

## 2020-10-06 DIAGNOSIS — C3491 Malignant neoplasm of unspecified part of right bronchus or lung: Secondary | ICD-10-CM | POA: Diagnosis not present

## 2020-10-06 DIAGNOSIS — R590 Localized enlarged lymph nodes: Secondary | ICD-10-CM | POA: Diagnosis not present

## 2020-10-06 DIAGNOSIS — C3490 Malignant neoplasm of unspecified part of unspecified bronchus or lung: Secondary | ICD-10-CM | POA: Diagnosis not present

## 2020-10-06 DIAGNOSIS — Z85118 Personal history of other malignant neoplasm of bronchus and lung: Secondary | ICD-10-CM | POA: Diagnosis not present

## 2020-10-06 MED ORDER — LIDOCAINE HCL (PF) 1 % IJ SOLN
INTRAMUSCULAR | Status: AC
  Filled 2020-10-06: qty 30

## 2020-10-06 MED ORDER — LIDOCAINE-EPINEPHRINE 1 %-1:100000 IJ SOLN
INTRAMUSCULAR | Status: AC
  Filled 2020-10-06: qty 1

## 2020-10-06 NOTE — Procedures (Signed)
Pre Procedure Dx: Hypermetabolic left supraclavicular lymph node Post Procedural Dx: Same  Technically successful US guided biopsy of hypermetabolic left supraclavicular lymph node.   EBL: None No immediate complications.   Ronny Bacon, MD Pager #: [phone]

## 2020-10-10 DIAGNOSIS — C3411 Malignant neoplasm of upper lobe, right bronchus or lung: Secondary | ICD-10-CM | POA: Diagnosis not present

## 2020-10-10 DIAGNOSIS — C7951 Secondary malignant neoplasm of bone: Secondary | ICD-10-CM | POA: Diagnosis not present

## 2020-10-10 DIAGNOSIS — G893 Neoplasm related pain (acute) (chronic): Secondary | ICD-10-CM | POA: Diagnosis not present

## 2020-10-10 DIAGNOSIS — C3491 Malignant neoplasm of unspecified part of right bronchus or lung: Secondary | ICD-10-CM | POA: Diagnosis not present

## 2020-10-10 DIAGNOSIS — Z510 Encounter for antineoplastic radiation therapy: Secondary | ICD-10-CM | POA: Diagnosis not present

## 2020-10-10 LAB — SURGICAL PATHOLOGY

## 2020-10-11 ENCOUNTER — Other Ambulatory Visit: Payer: Self-pay

## 2020-10-11 ENCOUNTER — Ambulatory Visit
Admission: RE | Admit: 2020-10-11 | Discharge: 2020-10-11 | Disposition: A | Discharge status: Home / Self Care | Payer: BC Managed Care – PPO | Source: Ambulatory Visit | Attending: Radiation Oncology | Admitting: Radiation Oncology

## 2020-10-11 DIAGNOSIS — C3411 Malignant neoplasm of upper lobe, right bronchus or lung: Secondary | ICD-10-CM | POA: Diagnosis not present

## 2020-10-11 DIAGNOSIS — C3491 Malignant neoplasm of unspecified part of right bronchus or lung: Secondary | ICD-10-CM | POA: Diagnosis not present

## 2020-10-11 DIAGNOSIS — C7951 Secondary malignant neoplasm of bone: Secondary | ICD-10-CM | POA: Diagnosis not present

## 2020-10-11 DIAGNOSIS — G893 Neoplasm related pain (acute) (chronic): Secondary | ICD-10-CM | POA: Diagnosis not present

## 2020-10-11 DIAGNOSIS — Z510 Encounter for antineoplastic radiation therapy: Secondary | ICD-10-CM | POA: Diagnosis not present

## 2020-10-12 ENCOUNTER — Other Ambulatory Visit: Payer: Self-pay

## 2020-10-12 ENCOUNTER — Ambulatory Visit
Admission: RE | Admit: 2020-10-12 | Discharge: 2020-10-12 | Disposition: A | Discharge status: Home / Self Care | Payer: BC Managed Care – PPO | Source: Ambulatory Visit | Attending: Radiation Oncology | Admitting: Radiation Oncology

## 2020-10-12 DIAGNOSIS — C3491 Malignant neoplasm of unspecified part of right bronchus or lung: Secondary | ICD-10-CM | POA: Diagnosis not present

## 2020-10-12 DIAGNOSIS — C3411 Malignant neoplasm of upper lobe, right bronchus or lung: Secondary | ICD-10-CM | POA: Diagnosis not present

## 2020-10-12 DIAGNOSIS — G893 Neoplasm related pain (acute) (chronic): Secondary | ICD-10-CM | POA: Diagnosis not present

## 2020-10-12 DIAGNOSIS — Z510 Encounter for antineoplastic radiation therapy: Secondary | ICD-10-CM | POA: Diagnosis not present

## 2020-10-12 DIAGNOSIS — C7951 Secondary malignant neoplasm of bone: Secondary | ICD-10-CM | POA: Diagnosis not present

## 2020-10-13 ENCOUNTER — Ambulatory Visit
Admission: RE | Admit: 2020-10-13 | Discharge: 2020-10-13 | Disposition: A | Discharge status: Home / Self Care | Payer: BC Managed Care – PPO | Source: Ambulatory Visit | Attending: Radiation Oncology | Admitting: Radiation Oncology

## 2020-10-13 DIAGNOSIS — C3491 Malignant neoplasm of unspecified part of right bronchus or lung: Secondary | ICD-10-CM | POA: Diagnosis not present

## 2020-10-13 DIAGNOSIS — Z510 Encounter for antineoplastic radiation therapy: Secondary | ICD-10-CM | POA: Diagnosis not present

## 2020-10-13 DIAGNOSIS — C3411 Malignant neoplasm of upper lobe, right bronchus or lung: Secondary | ICD-10-CM | POA: Diagnosis not present

## 2020-10-13 DIAGNOSIS — G893 Neoplasm related pain (acute) (chronic): Secondary | ICD-10-CM | POA: Diagnosis not present

## 2020-10-13 DIAGNOSIS — C7951 Secondary malignant neoplasm of bone: Secondary | ICD-10-CM | POA: Diagnosis not present

## 2020-10-16 ENCOUNTER — Other Ambulatory Visit: Payer: Self-pay

## 2020-10-16 ENCOUNTER — Ambulatory Visit
Admission: RE | Admit: 2020-10-16 | Discharge: 2020-10-16 | Disposition: A | Discharge status: Home / Self Care | Payer: BC Managed Care – PPO | Source: Ambulatory Visit | Attending: Radiation Oncology | Admitting: Radiation Oncology

## 2020-10-16 DIAGNOSIS — C7951 Secondary malignant neoplasm of bone: Secondary | ICD-10-CM | POA: Diagnosis not present

## 2020-10-16 DIAGNOSIS — C3411 Malignant neoplasm of upper lobe, right bronchus or lung: Secondary | ICD-10-CM | POA: Diagnosis not present

## 2020-10-16 DIAGNOSIS — Z510 Encounter for antineoplastic radiation therapy: Secondary | ICD-10-CM | POA: Diagnosis not present

## 2020-10-16 DIAGNOSIS — G893 Neoplasm related pain (acute) (chronic): Secondary | ICD-10-CM | POA: Diagnosis not present

## 2020-10-16 DIAGNOSIS — C3491 Malignant neoplasm of unspecified part of right bronchus or lung: Secondary | ICD-10-CM | POA: Diagnosis not present

## 2020-10-17 ENCOUNTER — Other Ambulatory Visit: Payer: Self-pay | Admitting: Cardiovascular Disease

## 2020-10-17 ENCOUNTER — Ambulatory Visit
Admission: RE | Admit: 2020-10-17 | Discharge: 2020-10-17 | Disposition: A | Discharge status: Home / Self Care | Payer: BC Managed Care – PPO | Source: Ambulatory Visit | Attending: Radiation Oncology | Admitting: Radiation Oncology

## 2020-10-17 DIAGNOSIS — Z510 Encounter for antineoplastic radiation therapy: Secondary | ICD-10-CM | POA: Diagnosis not present

## 2020-10-17 DIAGNOSIS — C3491 Malignant neoplasm of unspecified part of right bronchus or lung: Secondary | ICD-10-CM | POA: Diagnosis not present

## 2020-10-17 DIAGNOSIS — C7951 Secondary malignant neoplasm of bone: Secondary | ICD-10-CM | POA: Diagnosis not present

## 2020-10-17 DIAGNOSIS — C3411 Malignant neoplasm of upper lobe, right bronchus or lung: Secondary | ICD-10-CM | POA: Diagnosis not present

## 2020-10-17 DIAGNOSIS — G893 Neoplasm related pain (acute) (chronic): Secondary | ICD-10-CM | POA: Diagnosis not present

## 2020-10-18 ENCOUNTER — Ambulatory Visit
Admission: RE | Admit: 2020-10-18 | Discharge: 2020-10-18 | Disposition: A | Discharge status: Home / Self Care | Payer: BC Managed Care – PPO | Source: Ambulatory Visit | Attending: Radiation Oncology | Admitting: Radiation Oncology

## 2020-10-18 ENCOUNTER — Other Ambulatory Visit: Payer: Self-pay

## 2020-10-18 DIAGNOSIS — C3491 Malignant neoplasm of unspecified part of right bronchus or lung: Secondary | ICD-10-CM | POA: Diagnosis not present

## 2020-10-18 DIAGNOSIS — C3411 Malignant neoplasm of upper lobe, right bronchus or lung: Secondary | ICD-10-CM | POA: Diagnosis not present

## 2020-10-18 DIAGNOSIS — Z510 Encounter for antineoplastic radiation therapy: Secondary | ICD-10-CM | POA: Diagnosis not present

## 2020-10-18 DIAGNOSIS — C7951 Secondary malignant neoplasm of bone: Secondary | ICD-10-CM | POA: Diagnosis not present

## 2020-10-18 DIAGNOSIS — G893 Neoplasm related pain (acute) (chronic): Secondary | ICD-10-CM | POA: Diagnosis not present

## 2020-10-19 ENCOUNTER — Ambulatory Visit
Admission: RE | Admit: 2020-10-19 | Discharge: 2020-10-19 | Disposition: A | Discharge status: Home / Self Care | Payer: BC Managed Care – PPO | Source: Ambulatory Visit | Attending: Radiation Oncology | Admitting: Radiation Oncology

## 2020-10-19 DIAGNOSIS — C3411 Malignant neoplasm of upper lobe, right bronchus or lung: Secondary | ICD-10-CM | POA: Diagnosis not present

## 2020-10-19 DIAGNOSIS — C3491 Malignant neoplasm of unspecified part of right bronchus or lung: Secondary | ICD-10-CM | POA: Diagnosis not present

## 2020-10-19 DIAGNOSIS — Z510 Encounter for antineoplastic radiation therapy: Secondary | ICD-10-CM | POA: Diagnosis not present

## 2020-10-19 DIAGNOSIS — C7951 Secondary malignant neoplasm of bone: Secondary | ICD-10-CM | POA: Diagnosis not present

## 2020-10-19 DIAGNOSIS — G893 Neoplasm related pain (acute) (chronic): Secondary | ICD-10-CM | POA: Diagnosis not present

## 2020-10-20 ENCOUNTER — Ambulatory Visit
Admission: RE | Admit: 2020-10-20 | Discharge: 2020-10-20 | Disposition: A | Discharge status: Home / Self Care | Payer: BC Managed Care – PPO | Source: Ambulatory Visit | Attending: Radiation Oncology | Admitting: Radiation Oncology

## 2020-10-20 ENCOUNTER — Other Ambulatory Visit: Payer: Self-pay

## 2020-10-20 DIAGNOSIS — G893 Neoplasm related pain (acute) (chronic): Secondary | ICD-10-CM | POA: Diagnosis not present

## 2020-10-20 DIAGNOSIS — Z510 Encounter for antineoplastic radiation therapy: Secondary | ICD-10-CM | POA: Diagnosis not present

## 2020-10-20 DIAGNOSIS — C3491 Malignant neoplasm of unspecified part of right bronchus or lung: Secondary | ICD-10-CM | POA: Diagnosis not present

## 2020-10-20 DIAGNOSIS — C3411 Malignant neoplasm of upper lobe, right bronchus or lung: Secondary | ICD-10-CM | POA: Diagnosis not present

## 2020-10-20 DIAGNOSIS — C7951 Secondary malignant neoplasm of bone: Secondary | ICD-10-CM | POA: Diagnosis not present

## 2020-10-23 ENCOUNTER — Ambulatory Visit
Admission: RE | Admit: 2020-10-23 | Discharge: 2020-10-23 | Disposition: A | Discharge status: Home / Self Care | Payer: BC Managed Care – PPO | Source: Ambulatory Visit | Attending: Radiation Oncology | Admitting: Radiation Oncology

## 2020-10-23 ENCOUNTER — Other Ambulatory Visit: Payer: Self-pay

## 2020-10-23 DIAGNOSIS — Z510 Encounter for antineoplastic radiation therapy: Secondary | ICD-10-CM | POA: Diagnosis not present

## 2020-10-23 DIAGNOSIS — G893 Neoplasm related pain (acute) (chronic): Secondary | ICD-10-CM | POA: Diagnosis not present

## 2020-10-23 DIAGNOSIS — C3491 Malignant neoplasm of unspecified part of right bronchus or lung: Secondary | ICD-10-CM | POA: Diagnosis not present

## 2020-10-23 DIAGNOSIS — C3411 Malignant neoplasm of upper lobe, right bronchus or lung: Secondary | ICD-10-CM | POA: Diagnosis not present

## 2020-10-23 DIAGNOSIS — C7951 Secondary malignant neoplasm of bone: Secondary | ICD-10-CM | POA: Diagnosis not present

## 2020-10-24 ENCOUNTER — Encounter: Payer: Self-pay | Admitting: Radiation Oncology

## 2020-10-24 ENCOUNTER — Ambulatory Visit
Admission: RE | Admit: 2020-10-24 | Discharge: 2020-10-24 | Disposition: A | Discharge status: Home / Self Care | Payer: BC Managed Care – PPO | Source: Ambulatory Visit | Attending: Radiation Oncology | Admitting: Radiation Oncology

## 2020-10-24 DIAGNOSIS — Z510 Encounter for antineoplastic radiation therapy: Secondary | ICD-10-CM | POA: Diagnosis not present

## 2020-10-24 DIAGNOSIS — C3411 Malignant neoplasm of upper lobe, right bronchus or lung: Secondary | ICD-10-CM | POA: Diagnosis not present

## 2020-10-24 DIAGNOSIS — C7951 Secondary malignant neoplasm of bone: Secondary | ICD-10-CM | POA: Diagnosis not present

## 2020-10-24 DIAGNOSIS — G893 Neoplasm related pain (acute) (chronic): Secondary | ICD-10-CM | POA: Diagnosis not present

## 2020-10-24 DIAGNOSIS — C3491 Malignant neoplasm of unspecified part of right bronchus or lung: Secondary | ICD-10-CM | POA: Diagnosis not present

## 2020-10-30 ENCOUNTER — Inpatient Hospital Stay: Payer: BC Managed Care – PPO

## 2020-10-30 ENCOUNTER — Other Ambulatory Visit: Payer: Self-pay

## 2020-10-30 ENCOUNTER — Telehealth: Payer: Self-pay | Admitting: *Deleted

## 2020-10-30 ENCOUNTER — Encounter: Payer: Self-pay | Admitting: *Deleted

## 2020-10-30 ENCOUNTER — Inpatient Hospital Stay: Payer: BC Managed Care – PPO | Attending: Internal Medicine | Admitting: Internal Medicine

## 2020-10-30 VITALS — BP 145/90 | HR 69 | Temp 97.9°F | Resp 17 | Ht 68.5 in | Wt 137.0 lb

## 2020-10-30 DIAGNOSIS — Z5112 Encounter for antineoplastic immunotherapy: Secondary | ICD-10-CM | POA: Insufficient documentation

## 2020-10-30 DIAGNOSIS — R110 Nausea: Secondary | ICD-10-CM | POA: Diagnosis not present

## 2020-10-30 DIAGNOSIS — C7951 Secondary malignant neoplasm of bone: Secondary | ICD-10-CM | POA: Diagnosis not present

## 2020-10-30 DIAGNOSIS — I10 Essential (primary) hypertension: Secondary | ICD-10-CM

## 2020-10-30 DIAGNOSIS — C3491 Malignant neoplasm of unspecified part of right bronchus or lung: Secondary | ICD-10-CM | POA: Diagnosis not present

## 2020-10-30 DIAGNOSIS — Z79899 Other long term (current) drug therapy: Secondary | ICD-10-CM | POA: Diagnosis not present

## 2020-10-30 DIAGNOSIS — C3411 Malignant neoplasm of upper lobe, right bronchus or lung: Secondary | ICD-10-CM | POA: Diagnosis not present

## 2020-10-30 DIAGNOSIS — Z902 Acquired absence of lung [part of]: Secondary | ICD-10-CM | POA: Insufficient documentation

## 2020-10-30 DIAGNOSIS — C770 Secondary and unspecified malignant neoplasm of lymph nodes of head, face and neck: Secondary | ICD-10-CM | POA: Diagnosis not present

## 2020-10-30 DIAGNOSIS — Z5111 Encounter for antineoplastic chemotherapy: Secondary | ICD-10-CM | POA: Insufficient documentation

## 2020-10-30 LAB — CBC WITH DIFFERENTIAL (CANCER CENTER ONLY)
Abs Immature Granulocytes: 0.05 10*3/uL (ref 0.00–0.07)
Basophils Absolute: 0.1 10*3/uL (ref 0.0–0.1)
Basophils Relative: 1 %
Eosinophils Absolute: 0.2 10*3/uL (ref 0.0–0.5)
Eosinophils Relative: 2 %
HCT: 41.9 % (ref 39.0–52.0)
Hemoglobin: 15.4 g/dL (ref 13.0–17.0)
Immature Granulocytes: 1 %
Lymphocytes Relative: 16 %
Lymphs Abs: 1.5 10*3/uL (ref 0.7–4.0)
MCH: 35.2 pg — ABNORMAL HIGH (ref 26.0–34.0)
MCHC: 36.8 g/dL — ABNORMAL HIGH (ref 30.0–36.0)
MCV: 95.9 fL (ref 80.0–100.0)
Monocytes Absolute: 1.1 10*3/uL — ABNORMAL HIGH (ref 0.1–1.0)
Monocytes Relative: 11 %
Neutro Abs: 6.6 10*3/uL (ref 1.7–7.7)
Neutrophils Relative %: 69 %
Platelet Count: 323 10*3/uL (ref 150–400)
RBC: 4.37 MIL/uL (ref 4.22–5.81)
RDW: 12.1 % (ref 11.5–15.5)
WBC Count: 9.4 10*3/uL (ref 4.0–10.5)
nRBC: 0 % (ref 0.0–0.2)

## 2020-10-30 LAB — CMP (CANCER CENTER ONLY)
ALT: 37 U/L (ref 0–44)
AST: 72 U/L — ABNORMAL HIGH (ref 15–41)
Albumin: 3.9 g/dL (ref 3.5–5.0)
Alkaline Phosphatase: 139 U/L — ABNORMAL HIGH (ref 38–126)
Anion gap: 15 (ref 5–15)
BUN: <4 mg/dL — ABNORMAL LOW (ref 8–23)
CO2: 22 mmol/L (ref 22–32)
Calcium: 9.2 mg/dL (ref 8.9–10.3)
Chloride: 90 mmol/L — ABNORMAL LOW (ref 98–111)
Creatinine: 0.86 mg/dL (ref 0.61–1.24)
GFR, Estimated: >60 mL/min (ref >60)
Glucose, Bld: 136 mg/dL — ABNORMAL HIGH (ref 70–99)
Potassium: 4.3 mmol/L (ref 3.5–5.1)
Sodium: 127 mmol/L — ABNORMAL LOW (ref 135–145)
Total Bilirubin: 1 mg/dL (ref 0.3–1.2)
Total Protein: 7.8 g/dL (ref 6.5–8.1)

## 2020-10-30 MED ORDER — PROCHLORPERAZINE MALEATE 10 MG PO TABS: 10.0000 mg | ORAL_TABLET | Freq: Four times a day (QID) | ORAL | 0 refills | Status: DC | PRN

## 2020-10-30 MED ORDER — CYANOCOBALAMIN 1000 MCG/ML IJ SOLN
1000.0000 ug | Freq: Once | INTRAMUSCULAR | Status: AC
  Administered 2020-10-30: 1000 ug via INTRAMUSCULAR

## 2020-10-30 MED ORDER — CYANOCOBALAMIN 1000 MCG/ML IJ SOLN
INTRAMUSCULAR | Status: AC
  Filled 2020-10-30: qty 1

## 2020-10-30 MED ORDER — LIDOCAINE-PRILOCAINE 2.5-2.5 % EX CREA: TOPICAL_CREAM | CUTANEOUS | 0 refills | Status: DC

## 2020-10-30 MED ORDER — FOLIC ACID 1 MG PO TABS: 1.0000 mg | ORAL_TABLET | Freq: Every day | ORAL | 4 refills | Status: DC

## 2020-10-30 NOTE — Patient Instructions (Signed)
Pembrolizumab injection What is this medicine? PEMBROLIZUMAB (pem broe liz ue mab) is a monoclonal antibody. It is used to treat certain types of cancer. This medicine may be used for other purposes; ask your health care provider or pharmacist if you have questions. COMMON BRAND NAME(S): Keytruda What should I tell my health care provider before I take this medicine? They need to know if you have any of these conditions:  autoimmune diseases like Crohn's disease, ulcerative colitis, or lupus  have had or planning to have an allogeneic stem cell transplant (uses someone else's stem cells)  history of organ transplant  history of chest radiation  nervous system problems like myasthenia gravis or Guillain-Barre syndrome  an unusual or allergic reaction to pembrolizumab, other medicines, foods, dyes, or preservatives  pregnant or trying to get pregnant  breast-feeding How should I use this medicine? This medicine is for infusion into a vein. It is given by a health care professional in a hospital or clinic setting. A special MedGuide will be given to you before each treatment. Be sure to read this information carefully each time. Talk to your pediatrician regarding the use of this medicine in children. While this drug may be prescribed for children as young as 6 months for selected conditions, precautions do apply. Overdosage: If you think you have taken too much of this medicine contact a poison control center or emergency room at once. NOTE: This medicine is only for you. Do not share this medicine with others. What if I miss a dose? It is important not to miss your dose. Call your doctor or health care professional if you are unable to keep an appointment. What may interact with this medicine? Interactions have not been studied. This list may not describe all possible interactions. Give your health care provider a list of all the medicines, herbs, non-prescription drugs, or dietary  supplements you use. Also tell them if you smoke, drink alcohol, or use illegal drugs. Some items may interact with your medicine. What should I watch for while using this medicine? Your condition will be monitored carefully while you are receiving this medicine. You may need blood work done while you are taking this medicine. Do not become pregnant while taking this medicine or for 4 months after stopping it. Women should inform their doctor if they wish to become pregnant or think they might be pregnant. There is a potential for serious side effects to an unborn child. Talk to your health care professional or pharmacist for more information. Do not breast-feed an infant while taking this medicine or for 4 months after the last dose. What side effects may I notice from receiving this medicine? Side effects that you should report to your doctor or health care professional as soon as possible:  allergic reactions like skin rash, itching or hives, swelling of the face, lips, or tongue  bloody or black, tarry  breathing problems  changes in vision  chest pain  chills  confusion  constipation  cough  diarrhea  dizziness or feeling faint or lightheaded  fast or irregular heartbeat  fever  flushing  joint pain  low blood counts - this medicine may decrease the number of white blood cells, red blood cells and platelets. You may be at increased risk for infections and bleeding.  muscle pain  muscle weakness  pain, tingling, numbness in the hands or feet  persistent headache  redness, blistering, peeling or loosening of the skin, including inside the mouth  signs and   symptoms of high blood sugar such as dizziness; dry mouth; dry skin; fruity breath; nausea; stomach pain; increased hunger or thirst; increased urination  signs and symptoms of kidney injury like trouble passing urine or change in the amount of urine  signs and symptoms of liver injury like dark urine,  light-colored stools, loss of appetite, nausea, right upper belly pain, yellowing of the eyes or skin  sweating  swollen lymph nodes  weight loss Side effects that usually do not require medical attention (report to your doctor or health care professional if they continue or are bothersome):  decreased appetite  hair loss  tiredness This list may not describe all possible side effects. Call your doctor for medical advice about side effects. You may report side effects to FDA at 1-800-FDA-1088. Where should I keep my medicine? This drug is given in a hospital or clinic and will not be stored at home. NOTE: This sheet is a summary. It may not cover all possible information. If you have questions about this medicine, talk to your doctor, pharmacist, or health care provider.  2021 Elsevier/Gold Standard (2019-06-16 21:44:53) Pemetrexed injection What is this medicine? PEMETREXED (PEM e TREX ed) is a chemotherapy drug used to treat lung cancers like non-small cell lung cancer and mesothelioma. It may also be used to treat other cancers. This medicine may be used for other purposes; ask your health care provider or pharmacist if you have questions. COMMON BRAND NAME(S): Alimta What should I tell my health care provider before I take this medicine? They need to know if you have any of these conditions:  infection (especially a virus infection such as chickenpox, cold sores, or herpes)  kidney disease  low blood counts, like low white cell, platelet, or red cell counts  lung or breathing disease, like asthma  radiation therapy  an unusual or allergic reaction to pemetrexed, other medicines, foods, dyes, or preservative  pregnant or trying to get pregnant  breast-feeding How should I use this medicine? This drug is given as an infusion into a vein. It is administered in a hospital or clinic by a specially trained health care professional. Talk to your pediatrician regarding the  use of this medicine in children. Special care may be needed. Overdosage: If you think you have taken too much of this medicine contact a poison control center or emergency room at once. NOTE: This medicine is only for you. Do not share this medicine with others. What if I miss a dose? It is important not to miss your dose. Call your doctor or health care professional if you are unable to keep an appointment. What may interact with this medicine? This medicine may interact with the following medications:  Ibuprofen This list may not describe all possible interactions. Give your health care provider a list of all the medicines, herbs, non-prescription drugs, or dietary supplements you use. Also tell them if you smoke, drink alcohol, or use illegal drugs. Some items may interact with your medicine. What should I watch for while using this medicine? Visit your doctor for checks on your progress. This drug may make you feel generally unwell. This is not uncommon, as chemotherapy can affect healthy cells as well as cancer cells. Report any side effects. Continue your course of treatment even though you feel ill unless your doctor tells you to stop. In some cases, you may be given additional medicines to help with side effects. Follow all directions for their use. Call your doctor or health care   professional for advice if you get a fever, chills or sore throat, or other symptoms of a cold or flu. Do not treat yourself. This drug decreases your body's ability to fight infections. Try to avoid being around people who are sick. This medicine may increase your risk to bruise or bleed. Call your doctor or health care professional if you notice any unusual bleeding. Be careful brushing and flossing your teeth or using a toothpick because you may get an infection or bleed more easily. If you have any dental work done, tell your dentist you are receiving this medicine. Avoid taking products that contain aspirin,  acetaminophen, ibuprofen, naproxen, or ketoprofen unless instructed by your doctor. These medicines may hide a fever. Call your doctor or health care professional if you get diarrhea or mouth sores. Do not treat yourself. To protect your kidneys, drink water or other fluids as directed while you are taking this medicine. Do not become pregnant while taking this medicine or for 6 months after stopping it. Women should inform their doctor if they wish to become pregnant or think they might be pregnant. Men should not father a child while taking this medicine and for 3 months after stopping it. This may interfere with the ability to father a child. You should talk to your doctor or health care professional if you are concerned about your fertility. There is a potential for serious side effects to an unborn child. Talk to your health care professional or pharmacist for more information. Do not breast-feed an infant while taking this medicine or for 1 week after stopping it. What side effects may I notice from receiving this medicine? Side effects that you should report to your doctor or health care professional as soon as possible:  allergic reactions like skin rash, itching or hives, swelling of the face, lips, or tongue  breathing problems  redness, blistering, peeling or loosening of the skin, including inside the mouth  signs and symptoms of bleeding such as bloody or black, tarry stools; red or dark-brown urine; spitting up blood or brown material that looks like coffee grounds; red spots on the skin; unusual bruising or bleeding from the eye, gums, or nose  signs and symptoms of infection like fever or chills; cough; sore throat; pain or trouble passing urine  signs and symptoms of kidney injury like trouble passing urine or change in the amount of urine  signs and symptoms of liver injury like dark yellow or brown urine; general ill feeling or flu-like symptoms; light-colored stools; loss of  appetite; nausea; right upper belly pain; unusually weak or tired; yellowing of the eyes or skin Side effects that usually do not require medical attention (report to your doctor or health care professional if they continue or are bothersome):  constipation  mouth sores  nausea, vomiting  unusually weak or tired This list may not describe all possible side effects. Call your doctor for medical advice about side effects. You may report side effects to FDA at 1-800-FDA-1088. Where should I keep my medicine? This drug is given in a hospital or clinic and will not be stored at home. NOTE: This sheet is a summary. It may not cover all possible information. If you have questions about this medicine, talk to your doctor, pharmacist, or health care provider.  2021 Elsevier/Gold Standard (2017-09-03 16:11:33) Carboplatin injection What is this medicine? CARBOPLATIN (KAR boe pla tin) is a chemotherapy drug. It targets fast dividing cells, like cancer cells, and causes these cells to   die. This medicine is used to treat ovarian cancer and many other cancers. This medicine may be used for other purposes; ask your health care provider or pharmacist if you have questions. COMMON BRAND NAME(S): Paraplatin What should I tell my health care provider before I take this medicine? They need to know if you have any of these conditions:  blood disorders  hearing problems  kidney disease  recent or ongoing radiation therapy  an unusual or allergic reaction to carboplatin, cisplatin, other chemotherapy, other medicines, foods, dyes, or preservatives  pregnant or trying to get pregnant  breast-feeding How should I use this medicine? This drug is usually given as an infusion into a vein. It is administered in a hospital or clinic by a specially trained health care professional. Talk to your pediatrician regarding the use of this medicine in children. Special care may be needed. Overdosage: If you think  you have taken too much of this medicine contact a poison control center or emergency room at once. NOTE: This medicine is only for you. Do not share this medicine with others. What if I miss a dose? It is important not to miss a dose. Call your doctor or health care professional if you are unable to keep an appointment. What may interact with this medicine?  medicines for seizures  medicines to increase blood counts like filgrastim, pegfilgrastim, sargramostim  some antibiotics like amikacin, gentamicin, neomycin, streptomycin, tobramycin  vaccines Talk to your doctor or health care professional before taking any of these medicines:  acetaminophen  aspirin  ibuprofen  ketoprofen  naproxen This list may not describe all possible interactions. Give your health care provider a list of all the medicines, herbs, non-prescription drugs, or dietary supplements you use. Also tell them if you smoke, drink alcohol, or use illegal drugs. Some items may interact with your medicine. What should I watch for while using this medicine? Your condition will be monitored carefully while you are receiving this medicine. You will need important blood work done while you are taking this medicine. This drug may make you feel generally unwell. This is not uncommon, as chemotherapy can affect healthy cells as well as cancer cells. Report any side effects. Continue your course of treatment even though you feel ill unless your doctor tells you to stop. In some cases, you may be given additional medicines to help with side effects. Follow all directions for their use. Call your doctor or health care professional for advice if you get a fever, chills or sore throat, or other symptoms of a cold or flu. Do not treat yourself. This drug decreases your body's ability to fight infections. Try to avoid being around people who are sick. This medicine may increase your risk to bruise or bleed. Call your doctor or health  care professional if you notice any unusual bleeding. Be careful brushing and flossing your teeth or using a toothpick because you may get an infection or bleed more easily. If you have any dental work done, tell your dentist you are receiving this medicine. Avoid taking products that contain aspirin, acetaminophen, ibuprofen, naproxen, or ketoprofen unless instructed by your doctor. These medicines may hide a fever. Do not become pregnant while taking this medicine. Women should inform their doctor if they wish to become pregnant or think they might be pregnant. There is a potential for serious side effects to an unborn child. Talk to your health care professional or pharmacist for more information. Do not breast-feed an infant while taking   this medicine. What side effects may I notice from receiving this medicine? Side effects that you should report to your doctor or health care professional as soon as possible:  allergic reactions like skin rash, itching or hives, swelling of the face, lips, or tongue  signs of infection - fever or chills, cough, sore throat, pain or difficulty passing urine  signs of decreased platelets or bleeding - bruising, pinpoint red spots on the skin, black, tarry stools, nosebleeds  signs of decreased red blood cells - unusually weak or tired, fainting spells, lightheadedness  breathing problems  changes in hearing  changes in vision  chest pain  high blood pressure  low blood counts - This drug may decrease the number of white blood cells, red blood cells and platelets. You may be at increased risk for infections and bleeding.  nausea and vomiting  pain, swelling, redness or irritation at the injection site  pain, tingling, numbness in the hands or feet  problems with balance, talking, walking  trouble passing urine or change in the amount of urine Side effects that usually do not require medical attention (report to your doctor or health care  professional if they continue or are bothersome):  hair loss  loss of appetite  metallic taste in the mouth or changes in taste This list may not describe all possible side effects. Call your doctor for medical advice about side effects. You may report side effects to FDA at 1-800-FDA-1088. Where should I keep my medicine? This drug is given in a hospital or clinic and will not be stored at home. NOTE: This sheet is a summary. It may not cover all possible information. If you have questions about this medicine, talk to your doctor, pharmacist, or health care provider.  2021 Elsevier/Gold Standard (2007-10-20 14:38:05)  

## 2020-10-30 NOTE — Progress Notes (Signed)
DISCONTINUE ON PATHWAY REGIMEN - Non-Small Cell Lung     A cycle is every 21 days:     Pemetrexed      Cisplatin   **Always confirm dose/schedule in your pharmacy ordering system**  REASON: Disease Progression PRIOR TREATMENT: [id number]: Cisplatin + Pemetrexed q3 Weeks x 4 Cycles TREATMENT RESPONSE: Unable to Evaluate  START ON PATHWAY REGIMEN - Non-Small Cell Lung     A cycle is every 21 days:     Pembrolizumab      Pemetrexed      Carboplatin   **Always confirm dose/schedule in your pharmacy ordering system**  Patient Characteristics: Stage IV Metastatic, Nonsquamous, Molecular Analysis Completed, Molecular Alteration Present and Targeted Therapy Exhausted OR EGFR Exon 20+ or KRAS G12C+ Present and No Prior Chemo/Immunotherapy OR No Alteration Present, Initial  Chemotherapy/Immunotherapy, PS = 0, 1, No Alteration Present, No Alteration Present, Candidate for Immunotherapy, PD-L1 Expression Positive 1-49% (TPS) / Negative / Not Tested / Awaiting Test Results and Immunotherapy Candidate Therapeutic Status: Stage IV Metastatic Histology: Nonsquamous Cell Broad Molecular Profiling Status: Molecular Analysis Completed Molecular Analysis Results: No Alteration Present ECOG Performance Status: 1 Chemotherapy/Immunotherapy Line of Therapy: Initial Chemotherapy/Immunotherapy EGFR Exons 18-21 Mutation Testing Status: Completed and Negative ALK Fusion/Rearrangement Testing Status: Completed and Negative BRAF V600 Mutation Testing Status: Completed and Negative KRAS G12C Mutation Testing Status: Completed and Negative MET Exon 14 Mutation Testing Status: Completed and Negative RET Fusion/Rearrangement Testing Status: Completed and Negative NTRK Fusion/Rearrangement Testing Status: Completed and Negative ROS1 Fusion/Rearrangement Testing Status: Completed and Negative Immunotherapy Candidate Status: Candidate for Immunotherapy PD-L1 Expression Status: Awaiting Test Results Intent of  Therapy: Non-Curative / Palliative Intent, Discussed with Patient

## 2020-10-30 NOTE — Progress Notes (Signed)
Per Dr. [name] he would like tissue to be analyzed for PDL 1.  I updated pathology of his request.

## 2020-10-30 NOTE — Progress Notes (Signed)
[place] [phone]   [phone]  OFFICE PROGRESS NOTE  [name], NP [place] [address]  DIAGNOSIS: Metastatic non-small cell lung cancer initially diagnosed as stage IIIA (T3, N1, M0) non-small cell lung cancer, adenocarcinoma with no actionable mutations presented with multiple pulmonary nodules in the right upper lobe as well as metastatic disease in intraparenchymal lymphadenopathy.  This was diagnosed in February 2020.  The patient has disease recurrence in March 2022.  PRIOR THERAPY: 1) Status post right upper lobectomy with lymph node dissection. 2) Adjuvant systemic chemotherapy with cisplatin 75 mg/M2 and Alimta 500 mg/M2 every 3 weeks.  Status post 4 cycles. 3) palliative radiotherapy to the metastatic bone disease and the scapula as well as the left supraclavicular area under the care of Dr. Isidore Moos  CURRENT THERAPY: Systemic chemotherapy with carboplatin for AUC of 5, Alimta 500 mg/M2 and Keytruda 200 mg IV every 3 weeks.  First dose November 06, 2020.  INTERVAL HISTORY: Sean Rose 65 y.o. male returns to the clinic today for follow-up visit.  The patient is feeling much better after the palliative radiotherapy to the left scapular lesion.  He has some pain on the left upper extremity.  He denied having any chest pain, shortness of breath, cough or hemoptysis.  He denied having any fever or chills.  He has no nausea, vomiting, diarrhea or constipation.  He has no headache or visual changes.  The patient had ultrasound-guided core biopsy of the left supraclavicular lymph node and the final pathology was positive for metastatic carcinoma.  He is here today for evaluation and discussion of his treatment options.   MEDICAL HISTORY: Past Medical History:  Diagnosis Date  . Anginal pain ([place])   . CAD (coronary artery disease)   . CAP (community acquired pneumonia) 09/2016  . COPD (chronic obstructive  pulmonary disease) ([place])   . GERD (gastroesophageal reflux disease)   . Heart murmur    "I was told I had one when I was a kid"  . Hemorrhoids   . History of anal fissures    "no surgeries" (10/30/2016)  . Hyperlipidemia   . Hypertension   . lung ca dx'd 08/2018  . Peripheral arterial disease ([place])    status post right common iliac artery stenting back in 2007  . Seasonal allergies   . Tobacco abuse     ALLERGIES:  has No Known Allergies.  MEDICATIONS:  Current Outpatient Medications  Medication Sig Dispense Refill  . amLODipine (NORVASC) 10 MG tablet TAKE 1 TABLET(10 MG) BY MOUTH DAILY (Patient taking differently: Take 10 mg by mouth daily.) 90 tablet 3  . aspirin EC 81 MG tablet Take 81 mg by mouth daily.    . clopidogrel (PLAVIX) 75 MG tablet TAKE 1 TABLET BY MOUTH EVERY DAY (Patient taking differently: Take 75 mg by mouth daily.) 90 tablet 3  . diphenhydrAMINE (BENADRYL) 25 mg capsule Take 25 mg by mouth daily as needed for allergies.     Marland Kitchen esomeprazole (NEXIUM) 20 MG capsule Take 40 mg by mouth at bedtime.    Marland Kitchen ibuprofen (ADVIL,MOTRIN) 200 MG tablet Take 400 mg by mouth every 6 (six) hours as needed for headache or moderate pain.     . Magnesium 250 MG TABS Take 250 mg by mouth daily.    . metoprolol succinate (TOPROL-XL) 100 MG 24 hr tablet TAKE 1 TABLET BY MOUTH EVERY DAY 90 tablet 3  . Multiple Vitamin (MULTIVITAMIN  WITH MINERALS) TABS tablet Take 1 tablet by mouth daily.    . nitroGLYCERIN (NITROSTAT) 0.4 MG SL tablet PLACE 1 TABLET UNDER THE TONGUE EVERY 5 MINUTES AS NEEDED FOR CHEST PAIN. (Patient taking differently: Place 0.4 mg under the tongue every 5 (five) minutes as needed.) 25 tablet 6  . rosuvastatin (CRESTOR) 5 MG tablet Take 1 tablet (5 mg total) by mouth every other day. 45 tablet 3  . umeclidinium-vilanterol (ANORO ELLIPTA) 62.5-25 MCG/INH AEPB Inhale 1 puff into the lungs daily. 60 each 5   No current facility-administered medications for this visit.     SURGICAL HISTORY:  Past Surgical History:  Procedure Laterality Date  . ANKLE SURGERY Left    "rebuilt it"  . ANTERIOR CRUCIATE LIGAMENT REPAIR Right   . CARDIAC CATHETERIZATION  09/23/2008   Continued medical therapy - may need GI evaluation in addition.  Marland Kitchen CARDIAC CATHETERIZATION  10/28/2007   Medical therapy recommended.  Marland Kitchen CARDIAC CATHETERIZATION  11/18/2006   In-stent restenosis RCA  (50% distal edge, 80% segmental mid, and 50-60% segmental proximal). Successful cutting balloon atherectomy using a [id number] cutting balloon. 3 inflations with atherectomy performed on mid and proximal portions resulting in reduction of 80% mid in-stent restenosis to less than 20% residual and 50-60% segmental proximal to less than 20% residual without dissection.  Marland Kitchen CARDIAC CATHETERIZATION  02/26/2006   Severe stenosis in RCA. Stenting performed using IVUS. 3.5x20 Maverick balloon deployed at [employer]. Distal stent-a 4x28 Liberte stent-deployed 12atm 48sec, 12atm 31sec, 4atm 19sec. Mid stent-a 4x28 Liberte stent-deployed 14atm 45sec, 14atm 60sec, 14atm 44sec. Proximal stent-4x8 Liberte- 14atm 45sec,14atm 47sec, 16atm 43sec. Severely diseased segment then appeared TIMI-3 flow.  Marland Kitchen CARDIOVASCULAR STRESS TEST  11/17/2012   No significant ECG changes. Septal perfusion defect is new when complared to study from 2010. Abnormal myocardial perfusion imaging with a basal to mid perfusion suggestive of previous MI.  Marland Kitchen CAROTID DOPPLER  08/09/2011   Bilateral Bulb/Proximal ICA - demonstrated a mild amount of fibrous plaque without evidence of significant diameter reduction reduction or other vascular abnormality.  . CHEST TUBE INSERTION Right 09/02/2018   Procedure: Chest Tube Insertion;  Surgeon: Josephine Igo, DO;  Location: [facility] OR;  Service: Thoracic;  Laterality: Right;  . COLONOSCOPY     2003, 2014  . CORONARY ANGIOPLASTY WITH STENT PLACEMENT    . FEMORAL ARTERY STENT    . INGUINAL HERNIA REPAIR Right   . KNEE  ARTHROSCOPY Right "multiple"  . LAPAROSCOPIC APPENDECTOMY N/A 12/07/2018   Procedure: APPENDECTOMY LAPAROSCOPIC;  Surgeon: Abigail Miyamoto, MD;  Location: [facility];  Service: General;  Laterality: N/A;  . LOWER EXTREMITY ARTERIAL DOPPLER  01/31/2011   Bilateral ABIs-normal values with no suggestion of arterial insuff to the lower extremities at rest. Right CIA stent-mild amount of nonhemodynamically significant plaque is noted throughout  . 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 PERCUTANEOUS STENT INTERVENTION  04/04/2006 & 04/13/2015   a. Right common iliac artery with an 8.0x18 mm Herculink stent deployed at 12 atm. Stenosis was reduced from 80% to 0% with brisk flow. b. I-cast stenting to left common iliac artery  . PERIPHERAL VASCULAR CATHETERIZATION N/A 04/13/2015   Procedure: Lower Extremity Angiography;  Surgeon: Runell Gess, MD; L-oCIA 75%, 40-50% L-EIA, R-CIA stent patent, s/p 8 mm x 38 mm ICast covered stent>>0% stenosis in L-oCIA     . SHOULDER ARTHROSCOPY WITH ROTATOR CUFF REPAIR Right   . TRANSTHORACIC ECHOCARDIOGRAM  11/26/2012   EF not noted.  Aortic valve-sclerosis without stenosis, no regurgiation.   Marland Kitchen UPPER GASTROINTESTINAL ENDOSCOPY    . US CAROTID DOPPLER BILATERAL ([place] [facility])  08/09/2011   Bilateral Bulb/Proximal ICAa demonstrated a mild amount of fibrous plaque without evidence of significant diameter reduction or any other vascular abnormality.  Marland Kitchen VIDEO ASSISTED THORACOSCOPY (VATS)/ LOBECTOMY Right 09/14/2018   Procedure: RIGHT VIDEO ASSISTED THORACOSCOPY (VATS)/ RIGHT UPPER LOBECTOMY;  Surgeon: Melrose Nakayama, MD;  Location: [facility];  Service: Thoracic;  Laterality: Right;  Marland Kitchen VIDEO BRONCHOSCOPY WITH ENDOBRONCHIAL NAVIGATION N/A 09/02/2018   Procedure: VIDEO BRONCHOSCOPY WITH ENDOBRONCHIAL NAVIGATION;  Surgeon: Garner Nash, DO;  Location: [facility];  Service: Thoracic;  Laterality: N/A;    REVIEW OF SYSTEMS:  Constitutional: negative Eyes: negative Ears, nose,  mouth, throat, and face: negative Respiratory: negative Cardiovascular: negative Gastrointestinal: negative Genitourinary:negative Integument/breast: negative Hematologic/lymphatic: negative Musculoskeletal:positive for bone pain Neurological: negative Behavioral/Psych: negative Endocrine: negative Allergic/Immunologic: negative   PHYSICAL EXAMINATION: General appearance: alert, cooperative, fatigued and no distress Head: Normocephalic, without obvious abnormality, atraumatic Neck: no adenopathy, no JVD, supple, symmetrical, trachea midline and thyroid not enlarged, symmetric, no tenderness/mass/nodules Lymph nodes: Cervical, supraclavicular, and axillary nodes normal. Resp: clear to auscultation bilaterally Back: symmetric, no curvature. ROM normal. No CVA tenderness. Cardio: regular rate and rhythm, S1, S2 normal, no murmur, click, rub or gallop GI: soft, non-tender; bowel sounds normal; no masses,  no organomegaly Extremities: extremities normal, atraumatic, no cyanosis or edema Neurologic: Alert and oriented X 3, normal strength and tone. Normal symmetric reflexes. Normal coordination and gait  ECOG PERFORMANCE STATUS: 1 - Symptomatic but completely ambulatory  Blood pressure (!) 145/90, pulse 69, temperature 97.9 F (36.6 C), temperature source Tympanic, resp. rate 17, height 5' 8.5" (1.74 m), weight 137 lb (62.1 kg), SpO2 98 %.  LABORATORY DATA: Lab Results  Component Value Date   WBC 9.4 10/30/2020   HGB 15.4 10/30/2020   HCT 41.9 10/30/2020   MCV 95.9 10/30/2020   PLT 323 10/30/2020      Chemistry      Component Value Date/Time   NA 130 (L) 06/19/2020 1104   NA 130 (L) 03/05/2017 1110   K 4.6 06/19/2020 1104   CL 97 (L) 06/19/2020 1104   CO2 23 06/19/2020 1104   BUN <4 (L) 06/19/2020 1104   BUN 4 (L) 03/05/2017 1110   CREATININE 0.83 06/19/2020 1104   CREATININE 0.76 04/28/2020 0826      Component Value Date/Time   CALCIUM 9.8 06/19/2020 1104   ALKPHOS  120 08/25/2020 1201   AST 48 (H) 08/25/2020 1201   AST 41 06/19/2020 1104   ALT 26 08/25/2020 1201   ALT 22 06/19/2020 1104   BILITOT 0.7 08/25/2020 1201   BILITOT 0.6 06/19/2020 1104       RADIOGRAPHIC STUDIES: [place] CORE BIOPSY (LYMPH NODES)  Result Date: 10/06/2020 INDICATION: History of lung cancer, now with hypermetabolic left supraclavicular lymphadenopathy. Please from ultrasound-guided biopsy for tissue diagnostic purposes. EXAM: ULTRASOUND-GUIDED LEFT SUPRACLAVICULAR LYMPH NODE BIOPSY COMPARISON:  PET-CT-09/25/2020 MEDICATIONS: None ANESTHESIA/SEDATION: None, per patient request COMPLICATIONS: None immediate. TECHNIQUE: Informed written consent was obtained from the patient after a discussion of the risks, benefits and alternatives to treatment. Questions regarding the procedure were encouraged and answered. Initial ultrasound scanning demonstrated an approximately 1.1 x 0.9 cm lymph node within the lateral aspect of the left supraclavicular fossa correlating with the hypermetabolic supraclavicular lymph node seen on preceding PET-CT - image 47, series 4. An ultrasound image was saved for documentation purposes. The  procedure was planned. A timeout was performed prior to the initiation of the procedure. The operative was prepped and draped in the usual sterile fashion, and a sterile drape was applied covering the operative field. A timeout was performed prior to the initiation of the procedure. Local anesthesia was provided with 1% lidocaine with epinephrine. Under direct ultrasound guidance, an 18 gauge core needle device was utilized to obtain to obtain 6 core needle biopsies of the indeterminate left supraclavicular lymph node. The samples were placed in saline and submitted to pathology. The needle was removed and hemostasis was achieved with manual compression. Post procedure scan was negative for significant hematoma. A dressing was placed. The patient tolerated the procedure well without  immediate postprocedural complication. IMPRESSION: Technically successful ultrasound guided biopsy of indeterminate hypermetabolic left supraclavicular lymph node. Electronically Signed   By: [name] M.D.   On: 10/06/2020 14:25    ASSESSMENT AND PLAN: This is a very pleasant 65 years old white male with a stage IIIB non-small cell lung cancer, adenocarcinoma with no actionable mutations status post right upper lobectomy with lymph node dissection under the care of [name]. The patient completed a course of adjuvant treatment with cisplatin and Alimta status post 4 cycles.  He tolerated the previous 4 cycles of his treatment fairly well. The patient has been in observation but the recent imaging studies showed suspicious bone lesion in the left shoulder area. I ordered a PET scan which was performed on 09/25/2020 and it showed metastatic disease involving the left scapula and left supraclavicular lymph nodes. The patient had ultrasound-guided core biopsy of the left supraclavicular lymph node and it was positive for metastatic carcinoma. He underwent palliative radiotherapy to the metastatic bone lesion in the left scapula and he is feeling much better. I had a lengthy discussion with the patient today about his current condition and treatment options. I will send his tissue biopsy for PD-L1 expression.  He was tested in the past for molecular study by [facility] and it was reported to be negative for actionable mutations. I discussed with the patient his treatment options including palliative care versus palliative systemic chemotherapy with carboplatin for AUC of 5, Alimta 500 mg/M2 and Keytruda 200 mg IV every 3 weeks. The patient is interested in proceeding with the systemic chemotherapy. I discussed with him the adverse effect of this treatment including but not limited to alopecia, myelosuppression, nausea and vomiting, peripheral neuropathy, liver or renal dysfunction as well as  immunotherapy adverse effects. The patient is expected to start the first cycle of his treatment next week. I will arrange for the patient to receive vitamin B12 injection today. I will also refer him to IR for Port-A-Cath placement. I will call his pharmacy with prescription for Compazine and EMLA cream as well as folic acid. The patient was advised to call immediately if he has any concerning symptoms in the interval.  The patient voices understanding of current disease status and treatment options and is in agreement with the current care plan. All questions were answered. The patient knows to call the clinic with any problems, questions or concerns. We can certainly see the patient much sooner if necessary.  Disclaimer: This note was dictated with voice recognition software. Similar sounding words can inadvertently be transcribed and may not be corrected upon review.

## 2020-10-30 NOTE — Telephone Encounter (Signed)
Per Dr. [name], PDL 1 requested and pathology dept updated.

## 2020-11-02 ENCOUNTER — Other Ambulatory Visit: Payer: Self-pay

## 2020-11-02 ENCOUNTER — Telehealth: Payer: Self-pay | Admitting: Medical Oncology

## 2020-11-02 NOTE — Telephone Encounter (Signed)
Appts clarified through 5/30. Pt will get weekly labs on 04/18 in radiology

## 2020-11-03 NOTE — Progress Notes (Signed)
Pharmacist Chemotherapy Monitoring - Initial Assessment    Anticipated start date: 11/06/20  Regimen:  . Are orders appropriate based on the patient's diagnosis, regimen, and cycle? Yes . Does the plan date match the patient's scheduled date? Yes . Is the sequencing of drugs appropriate? Yes . Are the premedications appropriate for the patient's regimen? Yes . Prior Authorization for treatment is: Pending o If applicable, is the correct biosimilar selected based on the patient's insurance? not applicable  Organ Function and Labs: Marland Kitchen Are dose adjustments needed based on the patient's renal function, hepatic function, or hematologic function? Yes . Are appropriate labs ordered prior to the start of patient's treatment? Yes . Other organ system assessment, if indicated: N/A . The following baseline labs, if indicated, have been ordered: pembrolizumab: baseline TSH +/- T4  Dose Assessment: . Are the drug doses appropriate? Yes . Are the following correct: o Drug concentrations Yes o IV fluid compatible with drug Yes o Administration routes Yes o Timing of therapy Yes . If applicable, does the patient have documented access for treatment and/or plans for port-a-cath placement? yes . If applicable, have lifetime cumulative doses been properly documented and assessed? not applicable Lifetime Dose Tracking  No doses have been documented on this patient for the following tracked chemicals: Doxorubicin, Epirubicin, Idarubicin, Daunorubicin, Mitoxantrone, Bleomycin, Oxaliplatin, Carboplatin, Liposomal Doxorubicin  o   Toxicity Monitoring/Prevention: . The patient has the following take home antiemetics prescribed: Prochlorperazine . The patient has the following take home medications prescribed: N/A and B12 for pemetrexed . Medication allergies and previous infusion related reactions, if applicable, have been reviewed and addressed. Yes . The patient's current medication list has been assessed  for drug-drug interactions with their chemotherapy regimen. no significant drug-drug interactions were identified on review.  Order Review: . Are the treatment plan orders signed? Yes . Is the patient scheduled to see a provider prior to their treatment? No  I verify that I have reviewed each item in the above checklist and answered each question accordingly.  Sean Rose, [place], 11/03/2020  8:39 AM

## 2020-11-06 ENCOUNTER — Other Ambulatory Visit: Payer: Self-pay

## 2020-11-06 ENCOUNTER — Inpatient Hospital Stay: Payer: BC Managed Care – PPO

## 2020-11-06 ENCOUNTER — Other Ambulatory Visit: Payer: BC Managed Care – PPO

## 2020-11-06 VITALS — BP 123/88 | HR 70 | Temp 98.9°F | Resp 17

## 2020-11-06 DIAGNOSIS — Z5111 Encounter for antineoplastic chemotherapy: Secondary | ICD-10-CM | POA: Diagnosis not present

## 2020-11-06 DIAGNOSIS — R110 Nausea: Secondary | ICD-10-CM | POA: Diagnosis not present

## 2020-11-06 DIAGNOSIS — C3411 Malignant neoplasm of upper lobe, right bronchus or lung: Secondary | ICD-10-CM | POA: Diagnosis not present

## 2020-11-06 DIAGNOSIS — C770 Secondary and unspecified malignant neoplasm of lymph nodes of head, face and neck: Secondary | ICD-10-CM | POA: Diagnosis not present

## 2020-11-06 DIAGNOSIS — Z902 Acquired absence of lung [part of]: Secondary | ICD-10-CM | POA: Diagnosis not present

## 2020-11-06 DIAGNOSIS — Z5112 Encounter for antineoplastic immunotherapy: Secondary | ICD-10-CM | POA: Diagnosis not present

## 2020-11-06 DIAGNOSIS — C3491 Malignant neoplasm of unspecified part of right bronchus or lung: Secondary | ICD-10-CM

## 2020-11-06 DIAGNOSIS — C7951 Secondary malignant neoplasm of bone: Secondary | ICD-10-CM | POA: Diagnosis not present

## 2020-11-06 DIAGNOSIS — Z79899 Other long term (current) drug therapy: Secondary | ICD-10-CM | POA: Diagnosis not present

## 2020-11-06 LAB — CBC WITH DIFFERENTIAL (CANCER CENTER ONLY)
Abs Immature Granulocytes: 0.05 10*3/uL (ref 0.00–0.07)
Basophils Absolute: 0.1 10*3/uL (ref 0.0–0.1)
Basophils Relative: 1 %
Eosinophils Absolute: 0.3 10*3/uL (ref 0.0–0.5)
Eosinophils Relative: 4 %
HCT: 40.1 % (ref 39.0–52.0)
Hemoglobin: 14.7 g/dL (ref 13.0–17.0)
Immature Granulocytes: 1 %
Lymphocytes Relative: 17 %
Lymphs Abs: 1.2 10*3/uL (ref 0.7–4.0)
MCH: 34.9 pg — ABNORMAL HIGH (ref 26.0–34.0)
MCHC: 36.7 g/dL — ABNORMAL HIGH (ref 30.0–36.0)
MCV: 95.2 fL (ref 80.0–100.0)
Monocytes Absolute: 0.9 10*3/uL (ref 0.1–1.0)
Monocytes Relative: 12 %
Neutro Abs: 4.8 10*3/uL (ref 1.7–7.7)
Neutrophils Relative %: 65 %
Platelet Count: 282 10*3/uL (ref 150–400)
RBC: 4.21 MIL/uL — ABNORMAL LOW (ref 4.22–5.81)
RDW: 12.2 % (ref 11.5–15.5)
WBC Count: 7.3 10*3/uL (ref 4.0–10.5)
nRBC: 0 % (ref 0.0–0.2)

## 2020-11-06 LAB — CMP (CANCER CENTER ONLY)
ALT: 32 U/L (ref 0–44)
AST: 63 U/L — ABNORMAL HIGH (ref 15–41)
Albumin: 3.9 g/dL (ref 3.5–5.0)
Alkaline Phosphatase: 117 U/L (ref 38–126)
Anion gap: 9 (ref 5–15)
BUN: 3 mg/dL — ABNORMAL LOW (ref 8–23)
CO2: 24 mmol/L (ref 22–32)
Calcium: 9.5 mg/dL (ref 8.9–10.3)
Chloride: 94 mmol/L — ABNORMAL LOW (ref 98–111)
Creatinine: 0.82 mg/dL (ref 0.61–1.24)
GFR, Estimated: >60 mL/min (ref >60)
Glucose, Bld: 109 mg/dL — ABNORMAL HIGH (ref 70–99)
Potassium: 4.6 mmol/L (ref 3.5–5.1)
Sodium: 127 mmol/L — ABNORMAL LOW (ref 135–145)
Total Bilirubin: 0.7 mg/dL (ref 0.3–1.2)
Total Protein: 7 g/dL (ref 6.5–8.1)

## 2020-11-06 LAB — TSH: TSH: 1.272 u[IU]/mL (ref 0.320–4.118)

## 2020-11-06 MED ORDER — SODIUM CHLORIDE 0.9 % IV SOLN
200.0000 mg | Freq: Once | INTRAVENOUS | Status: AC
  Administered 2020-11-06: 200 mg via INTRAVENOUS
  Filled 2020-11-06: qty 8

## 2020-11-06 MED ORDER — PALONOSETRON HCL INJECTION 0.25 MG/5ML
0.2500 mg | Freq: Once | INTRAVENOUS | Status: AC
  Administered 2020-11-06: 0.25 mg via INTRAVENOUS

## 2020-11-06 MED ORDER — SODIUM CHLORIDE 0.9 % IV SOLN
10.0000 mg | Freq: Once | INTRAVENOUS | Status: AC
  Administered 2020-11-06: 10 mg via INTRAVENOUS
  Filled 2020-11-06: qty 10

## 2020-11-06 MED ORDER — SODIUM CHLORIDE 0.9 % IV SOLN
500.0000 mg/m2 | Freq: Once | INTRAVENOUS | Status: AC
  Administered 2020-11-06: 900 mg via INTRAVENOUS
  Filled 2020-11-06: qty 20

## 2020-11-06 MED ORDER — SODIUM CHLORIDE 0.9 % IV SOLN
506.0000 mg | Freq: Once | INTRAVENOUS | Status: AC
  Administered 2020-11-06: 510 mg via INTRAVENOUS
  Filled 2020-11-06: qty 51

## 2020-11-06 MED ORDER — PALONOSETRON HCL INJECTION 0.25 MG/5ML
INTRAVENOUS | Status: AC
  Filled 2020-11-06: qty 5

## 2020-11-06 MED ORDER — SODIUM CHLORIDE 0.9 % IV SOLN
150.0000 mg | Freq: Once | INTRAVENOUS | Status: AC
  Administered 2020-11-06: 150 mg via INTRAVENOUS
  Filled 2020-11-06: qty 150

## 2020-11-06 MED ORDER — SODIUM CHLORIDE 0.9 % IV SOLN
Freq: Once | INTRAVENOUS | Status: AC
  Filled 2020-11-06: qty 250

## 2020-11-06 NOTE — Patient Instructions (Signed)
[facility] Discharge Instructions for Patients Receiving Chemotherapy  Today you received the following chemotherapy agents Alimta, Keytruda and Carboplatin  To help prevent nausea and vomiting after your treatment, we encourage you to take your nausea medication as directed by your MD **DO NOT TAKE ZOFRAN FOR 3 DAYS AFTER CHEMOTHERAPY**   If you develop nausea and vomiting that is not controlled by your nausea medication, call the clinic.   BELOW ARE SYMPTOMS THAT SHOULD BE REPORTED IMMEDIATELY:  *FEVER GREATER THAN 100.5 F  *CHILLS WITH OR WITHOUT FEVER  NAUSEA AND VOMITING THAT IS NOT CONTROLLED WITH YOUR NAUSEA MEDICATION  *UNUSUAL SHORTNESS OF BREATH  *UNUSUAL BRUISING OR BLEEDING  TENDERNESS IN MOUTH AND THROAT WITH OR WITHOUT PRESENCE OF ULCERS  *URINARY PROBLEMS  *BOWEL PROBLEMS  UNUSUAL RASH Items with * indicate a potential emergency and should be followed up as soon as possible.  Feel free to call the clinic should you have any questions or concerns. The clinic phone number is [phone].  Please show the [place] at check-in to the Emergency Department and triage nurse.

## 2020-11-07 ENCOUNTER — Telehealth: Payer: Self-pay | Admitting: Internal Medicine

## 2020-11-07 NOTE — Telephone Encounter (Signed)
Scheduled appt per 4/7 sch msg. Did not call pt per msg, stated pt looks at all appts in [place]

## 2020-11-10 ENCOUNTER — Other Ambulatory Visit: Payer: Self-pay | Admitting: Radiology

## 2020-11-13 ENCOUNTER — Other Ambulatory Visit: Payer: Self-pay

## 2020-11-13 ENCOUNTER — Ambulatory Visit (HOSPITAL_COMMUNITY)
Admission: RE | Admit: 2020-11-13 | Discharge: 2020-11-13 | Disposition: A | Discharge status: Home / Self Care | Payer: BC Managed Care – PPO | Source: Ambulatory Visit | Attending: Internal Medicine | Admitting: Internal Medicine

## 2020-11-13 ENCOUNTER — Encounter (HOSPITAL_COMMUNITY): Payer: Self-pay

## 2020-11-13 DIAGNOSIS — Z7982 Long term (current) use of aspirin: Secondary | ICD-10-CM | POA: Diagnosis not present

## 2020-11-13 DIAGNOSIS — Z452 Encounter for adjustment and management of vascular access device: Secondary | ICD-10-CM | POA: Diagnosis not present

## 2020-11-13 DIAGNOSIS — C3491 Malignant neoplasm of unspecified part of right bronchus or lung: Secondary | ICD-10-CM | POA: Diagnosis not present

## 2020-11-13 DIAGNOSIS — Z87891 Personal history of nicotine dependence: Secondary | ICD-10-CM | POA: Insufficient documentation

## 2020-11-13 DIAGNOSIS — Z79899 Other long term (current) drug therapy: Secondary | ICD-10-CM | POA: Diagnosis not present

## 2020-11-13 HISTORY — PX: IR IMAGING GUIDED PORT INSERTION: IMG5740

## 2020-11-13 MED ORDER — FENTANYL CITRATE (PF) 100 MCG/2ML IJ SOLN
INTRAMUSCULAR | Status: AC
  Filled 2020-11-13: qty 2

## 2020-11-13 MED ORDER — FENTANYL CITRATE (PF) 100 MCG/2ML IJ SOLN
INTRAMUSCULAR | Status: AC | PRN
  Administered 2020-11-13 (×2): 50 ug via INTRAVENOUS

## 2020-11-13 MED ORDER — LIDOCAINE-EPINEPHRINE 1 %-1:100000 IJ SOLN
INTRAMUSCULAR | Status: AC
  Filled 2020-11-13: qty 1

## 2020-11-13 MED ORDER — MIDAZOLAM HCL 2 MG/2ML IJ SOLN
INTRAMUSCULAR | Status: AC | PRN
  Administered 2020-11-13 (×3): 1 mg via INTRAVENOUS

## 2020-11-13 MED ORDER — MIDAZOLAM HCL 2 MG/2ML IJ SOLN
INTRAMUSCULAR | Status: AC
  Filled 2020-11-13: qty 4

## 2020-11-13 MED ORDER — HEPARIN SOD (PORK) LOCK FLUSH 100 UNIT/ML IV SOLN
INTRAVENOUS | Status: AC
  Filled 2020-11-13: qty 5

## 2020-11-13 MED ORDER — LIDOCAINE-EPINEPHRINE 1 %-1:100000 IJ SOLN
INTRAMUSCULAR | Status: AC | PRN
  Administered 2020-11-13 (×2): 10 mL via INTRADERMAL

## 2020-11-13 MED ORDER — SODIUM CHLORIDE 0.9 % IV SOLN: INTRAVENOUS | Status: DC

## 2020-11-13 NOTE — Discharge Instructions (Signed)
No change to your home medications.    Interventional radiology phone numbers [phone] After hours [phone]    You have skin glue (dermabond) over your new port. Do not use the lidocaine cream (EMLA cream) over the skin glue until it has healed. The petroleum in the lidocaine cream will dissolve the skin glue resulting in an infection of your new port. Use ice in a zip lock bag for 1-2 minutes over your new port before the cancer center nurses access your port.   Implanted Port Insertion, Care After This sheet gives you information about how to care for yourself after your procedure. Your health care provider may also give you more specific instructions. If you have problems or questions, contact your health care provider. What can I expect after the procedure? After the procedure, it is common to have:  Discomfort at the port insertion site.  Bruising on the skin over the port. This should improve over 3-4 days. Follow these instructions at home: [facility] care  After your port is placed, you will get a manufacturer's information card. The card has information about your port. Keep this card with you at all times.  Take care of the port as told by your health care provider. Ask your health care provider if you or a family member can get training for taking care of the port at home. A home health care nurse may also take care of the port.  Make sure to remember what type of port you have. Incision care 1. Follow instructions from your health care provider about how to take care of your port insertion site. Make sure you: ? Wash your hands with soap and water before and after you change your bandage (dressing). If soap and water are not available, use hand sanitizer. ? Change your dressing as told by your health care provider. 2. Leave skin glue in place. These skin closures may need to stay in place for 2 weeks or longer.  3. Check your port insertion site every day for signs  of infection. Check for: ? Redness, swelling, or pain. ? Fluid or blood. ? Warmth. ? Pus or a bad smell.      Activity  Return to your normal activities as told by your health care provider. Ask your health care provider what activities are safe for you.  Do not lift anything that is heavier than 10 lb (4.5 kg), or the limit that you are told, until your health care provider says that it is safe. General instructions  Take over-the-counter and prescription medicines only as told by your health care provider.  Do not take baths, swim, or use a hot tub until your health care provider approves.You may remove your dressing tomorrow and shower 24 hours after your procedure.  Do not drive for 24 hours if you were given a sedative during your procedure.  Wear a medical alert bracelet in case of an emergency. This will tell any health care providers that you have a port.  Keep all follow-up visits as told by your health care provider. This is important. Contact a health care provider if:  You cannot flush your port with saline as directed, or you cannot draw blood from the port.  You have a fever or chills.  You have redness, swelling, or pain around your port insertion site.  You have fluid or blood coming from your port insertion site.  Your port insertion site feels warm to the touch.  You have pus or  a bad smell coming from the port insertion site. Get help right away if:  You have chest pain or shortness of breath.  You have bleeding from your port that you cannot control. Summary  Take care of the port as told by your health care provider. Keep the manufacturer's information card with you at all times.  Change your dressing as told by your health care provider.  Contact a health care provider if you have a fever or chills or if you have redness, swelling, or pain around your port insertion site.  Keep all follow-up visits as told by your health care provider. This  information is not intended to replace advice given to you by your health care provider. Make sure you discuss any questions you have with your health care provider. Document Revised: 02/10/2018 Document Reviewed: 02/10/2018 Elsevier Patient Education  2021 [employer].    Moderate Conscious Sedation, Adult, Care After This sheet gives you information about how to care for yourself after your procedure. Your health care provider may also give you more specific instructions. If you have problems or questions, contact your health care provider. What can I expect after the procedure? After the procedure, it is common to have:  Sleepiness for several hours.  Impaired judgment for several hours.  Difficulty with balance.  Vomiting if you eat too soon. Follow these instructions at home: For the time period you were told by your health care provider:  Rest.  Do not participate in activities where you could fall or become injured.  Do not drive or use machinery.  Do not drink alcohol.  Do not take sleeping pills or medicines that cause drowsiness.  Do not make important decisions or sign legal documents.  Do not take care of children on your own.      Eating and drinking 4. Follow the diet recommended by your health care provider. 5. Drink enough fluid to keep your urine pale yellow. 6. If you vomit: ? Drink water, juice, or soup when you can drink without vomiting. ? Make sure you have little or no nausea before eating solid foods.   General instructions  Take over-the-counter and prescription medicines only as told by your health care provider.  Have a responsible adult stay with you for the time you are told. It is important to have someone help care for you until you are awake and alert.  Do not smoke.  Keep all follow-up visits as told by your health care provider. This is important. Contact a health care provider if:  You are still sleepy or having trouble with  balance after 24 hours.  You feel light-headed.  You keep feeling nauseous or you keep vomiting.  You develop a rash.  You have a fever.  You have redness or swelling around the IV site. Get help right away if:  You have trouble breathing.  You have new-onset confusion at home. Summary  After the procedure, it is common to feel sleepy, have impaired judgment, or feel nauseous if you eat too soon.  Rest after you get home. Know the things you should not do after the procedure.  Follow the diet recommended by your health care provider and drink enough fluid to keep your urine pale yellow.  Get help right away if you have trouble breathing or new-onset confusion at home. This information is not intended to replace advice given to you by your health care provider. Make sure you discuss any questions you have with your  health care provider. Document Revised: 11/12/2019 Document Reviewed: 06/10/2019 Elsevier Patient Education  2021 [employer].

## 2020-11-13 NOTE — Procedures (Signed)
  Procedure: R IJ Port catheter placement   EBL:   minimal Complications:  none immediate  See full dictation in [employer].  Dillard Cannon MD Main # [phone] Pager  [phone] Mobile [phone]

## 2020-11-13 NOTE — H&P (Addendum)
Chief Complaint: Patient was seen in consultation today for image guided port-a-catheter placement at the request of [facility]  Referring Physician(s): Mohamed,Mohamed  Supervising Physician: Arne Cleveland  Patient Status: [facility] - Out-pt  History of Present Illness: Sean Rose is a 65 y.o. male with PMH significant of HTN, hyperlipidemia, GERD, COPD, CAD, peripheral artery disease, and metastatic non-small cell lung cancer diagnosed in 2020 s/p right upper lobectomy with lymph node dissection and chemoradiation therapy.  Patient has been followed by Dr. Earlie Server at the cancer center.   Patient was in his normal status of health utill the spring 2021 when he developed left shoulder pain and which was evaluated by his PCP.  Patient eventually underwent MRI of the left shoulder which showed large destructive lesion in the glenoid and coracoid process highly suspicious for metastatic lung carcinoma. Subsequent PET scan showed metastatic disease involving the left scapula and left supraclavicular lymph nodes.  Patient underwent ultrasound-guided core biopsy of left supraclavicular lymph node with Dr. Pascal Lux on 10/06/2020 and pathology revealed a poorly differentiated carcinoma.   After thorough discussion and shared decision making with his oncology team, patient decided to undergo a palliative systemic chemotherapy for the lung cancer.  IR was requested for image guided portacatheter placement.  Patient laying in bed, not in acute distress.  Reports nonproductive cough which patient relates to side effect of chemotherapy.  Denise headache, fever, chills, shortness of breath, chest pain, abdominal pain, nausea ,vomiting, and bleeding.  Past Medical History:  Diagnosis Date  . Anginal pain ([place])   . CAD (coronary artery disease)   . CAP (community acquired pneumonia) 09/2016  . COPD (chronic obstructive pulmonary disease) ([place])   . GERD (gastroesophageal reflux disease)   . Heart  murmur    "I was told I had one when I was a kid"  . Hemorrhoids   . History of anal fissures    "no surgeries" (10/30/2016)  . Hyperlipidemia   . Hypertension   . lung ca dx'd 08/2018  . Peripheral arterial disease ([place])    status post right common iliac artery stenting back in 2007  . Seasonal allergies   . Tobacco abuse     Past Surgical History:  Procedure Laterality Date  . ANKLE SURGERY Left    "rebuilt it"  . ANTERIOR CRUCIATE LIGAMENT REPAIR Right   . CARDIAC CATHETERIZATION  09/23/2008   Continued medical therapy - may need GI evaluation in addition.  Marland Kitchen CARDIAC CATHETERIZATION  10/28/2007   Medical therapy recommended.  Marland Kitchen CARDIAC CATHETERIZATION  11/18/2006   In-stent restenosis RCA  (50% distal edge, 80% segmental mid, and 50-60% segmental proximal). Successful cutting balloon atherectomy using a [id number] cutting balloon. 3 inflations with atherectomy performed on mid and proximal portions resulting in reduction of 80% mid in-stent restenosis to less than 20% residual and 50-60% segmental proximal to less than 20% residual without dissection.  Marland Kitchen CARDIAC CATHETERIZATION  02/26/2006   Severe stenosis in RCA. Stenting performed using IVUS. 3.5x20 Maverick balloon deployed at [employer]. Distal stent-a 4x28 Liberte stent-deployed 12atm 48sec, 12atm 31sec, 4atm 19sec. Mid stent-a 4x28 Liberte stent-deployed 14atm 45sec, 14atm 60sec, 14atm 44sec. Proximal stent-4x8 Liberte- 14atm 45sec,14atm 47sec, 16atm 43sec. Severely diseased segment then appeared TIMI-3 flow.  Marland Kitchen CARDIOVASCULAR STRESS TEST  11/17/2012   No significant ECG changes. Septal perfusion defect is new when complared to study from 2010. Abnormal myocardial perfusion imaging with a basal to mid perfusion suggestive of previous MI.  Marland Kitchen CAROTID DOPPLER  08/09/2011  Bilateral Bulb/Proximal ICA - demonstrated a mild amount of fibrous plaque without evidence of significant diameter reduction reduction or other vascular abnormality.  .  CHEST TUBE INSERTION Right 09/02/2018   Procedure: Chest Tube Insertion;  Surgeon: Garner Nash, DO;  Location: [place] [facility];  Service: Thoracic;  Laterality: Right;  . COLONOSCOPY     2003, 2014  . CORONARY ANGIOPLASTY WITH STENT PLACEMENT    . FEMORAL ARTERY STENT    . INGUINAL HERNIA REPAIR Right   . KNEE ARTHROSCOPY Right "multiple"  . LAPAROSCOPIC APPENDECTOMY N/A 12/07/2018   Procedure: APPENDECTOMY LAPAROSCOPIC;  Surgeon: Coralie Keens, MD;  Location: [facility];  Service: General;  Laterality: N/A;  . LOWER EXTREMITY ARTERIAL DOPPLER  01/31/2011   Bilateral ABIs-normal values with no suggestion of arterial insuff to the lower extremities at rest. Right CIA stent-mild amount of nonhemodynamically significant plaque is noted throughout  . 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 PERCUTANEOUS STENT INTERVENTION  04/04/2006 & 04/13/2015   a. Right common iliac artery with an 8.0x18 mm Herculink stent deployed at 12 atm. Stenosis was reduced from 80% to 0% with brisk flow. b. I-cast stenting to left common iliac artery  . PERIPHERAL VASCULAR CATHETERIZATION N/A 04/13/2015   Procedure: Lower Extremity Angiography;  Surgeon: Lorretta Harp, MD; L-oCIA 75%, 40-50% L-EIA, R-CIA stent patent, s/p 8 mm x 38 mm ICast covered stent>>0% stenosis in L-oCIA     . SHOULDER ARTHROSCOPY WITH ROTATOR CUFF REPAIR Right   . TRANSTHORACIC ECHOCARDIOGRAM  11/26/2012   EF not noted. Aortic valve-sclerosis without stenosis, no regurgiation.   Marland Kitchen UPPER GASTROINTESTINAL ENDOSCOPY    . US CAROTID DOPPLER BILATERAL ([place] [facility])  08/09/2011   Bilateral Bulb/Proximal ICAa demonstrated a mild amount of fibrous plaque without evidence of significant diameter reduction or any other vascular abnormality.  Marland Kitchen VIDEO ASSISTED THORACOSCOPY (VATS)/ LOBECTOMY Right 09/14/2018   Procedure: RIGHT VIDEO ASSISTED THORACOSCOPY (VATS)/ RIGHT UPPER LOBECTOMY;  Surgeon: Melrose Nakayama, MD;  Location: [place];  Service: Thoracic;   Laterality: Right;  Marland Kitchen VIDEO BRONCHOSCOPY WITH ENDOBRONCHIAL NAVIGATION N/A 09/02/2018   Procedure: VIDEO BRONCHOSCOPY WITH ENDOBRONCHIAL NAVIGATION;  Surgeon: Garner Nash, DO;  Location: [facility];  Service: Thoracic;  Laterality: N/A;    Allergies: Patient has no known allergies.  Medications: Prior to Admission medications   Medication Sig Start Date End Date Taking? Authorizing Provider  amLODipine (NORVASC) 10 MG tablet TAKE 1 TABLET(10 MG) BY MOUTH DAILY Patient taking differently: Take 10 mg by mouth daily. 09/19/20  Yes Lorretta Harp, MD  aspirin EC 81 MG tablet Take 81 mg by mouth daily.   Yes [provider]  clopidogrel (PLAVIX) 75 MG tablet TAKE 1 TABLET BY MOUTH EVERY DAY Patient taking differently: Take 75 mg by mouth daily. 09/19/20  Yes Lorretta Harp, MD  diphenhydrAMINE (BENADRYL) 25 mg capsule Take 25 mg by mouth daily as needed for allergies.    Yes [provider]  esomeprazole (NEXIUM) 20 MG capsule Take 40 mg by mouth at bedtime.   Yes [provider]  folic acid (FOLVITE) 1 MG tablet Take 1 tablet (1 mg total) by mouth daily. 10/30/20  Yes [name], MD  ibuprofen (ADVIL,MOTRIN) 200 MG tablet Take 400 mg by mouth every 6 (six) hours as needed for headache or moderate pain.    Yes [provider]  Magnesium 250 MG TABS Take 250 mg by mouth daily.   Yes [provider]  metoprolol succinate (  TOPROL-XL) 100 MG 24 hr tablet TAKE 1 TABLET BY MOUTH EVERY DAY 10/17/20  Yes Lorretta Harp, MD  Multiple Vitamin (MULTIVITAMIN WITH MINERALS) TABS tablet Take 1 tablet by mouth daily.   Yes [provider]  rosuvastatin (CRESTOR) 5 MG tablet Take 1 tablet (5 mg total) by mouth every other day. 08/30/20 11/28/20 Yes Lorretta Harp, MD  umeclidinium-vilanterol (ANORO ELLIPTA) 62.5-25 MCG/INH AEPB Inhale 1 puff into the lungs daily. 05/26/20  Yes Icard, Bradley L, DO  lidocaine-prilocaine (EMLA) cream Apply to the  Port-A-Cath site 30-60 minutes before treatment. 10/30/20   [name], MD  nitroGLYCERIN (NITROSTAT) 0.4 MG SL tablet PLACE 1 TABLET UNDER THE TONGUE EVERY 5 MINUTES AS NEEDED FOR CHEST PAIN. Patient taking differently: Place 0.4 mg under the tongue every 5 (five) minutes as needed. 09/19/20   Lorretta Harp, MD  prochlorperazine (COMPAZINE) 10 MG tablet Take 1 tablet (10 mg total) by mouth every 6 (six) hours as needed for nausea or vomiting. 10/30/20   [name], MD     Family History  Problem Relation Age of Onset  . Colon cancer Mother   . Heart disease Father   . Heart disease Paternal Grandfather     Social History   Socioeconomic History  . Marital status: Married    Spouse name: Not on file  . Number of children: Not on file  . Years of education: Not on file  . Highest education level: Not on file  Occupational History  . Not on file  Tobacco Use  . Smoking status: Former Smoker    Packs/day: 0.25    Years: 38.00    Pack years: 9.50    Types: Cigarettes    Quit date: 10/19/2018    Years since quitting: 2.0  . Smokeless tobacco: Never Used  . Tobacco comment: pack per day 12.17.19  Vaping Use  . Vaping Use: Former  Substance and Sexual Activity  . Alcohol use: Yes    Comment: socially  . Drug use: No  . Sexual activity: Yes  Other Topics Concern  . Not on file  Social History Narrative  . Not on file   Social Determinants of Health   Financial Resource Strain: Not on file  Food Insecurity: Not on file  Transportation Needs: Not on file  Physical Activity: Not on file  Stress: Not on file  Social Connections: Not on file     Review of Systems: A 12 point ROS discussed and pertinent positives are indicated in the HPI above.  All other systems are negative.   Vital Signs: BP (!) 145/96   Pulse 76   Temp 98 F (36.7 C) (Oral)   Resp 16   SpO2 97%   Physical Exam  Vitals and nursing note reviewed.  Constitutional:      General: He  is not in acute distress.    Appearance: Normal appearance.  HENT:     Head: Normocephalic and atraumatic.     Mouth/Throat:     Mouth: Mucous membranes are moist.     Pharynx: Oropharynx is clear.  Cardiovascular:     Rate and Rhythm: Normal rate and regular rhythm.     Pulses: Normal pulses.     Heart sounds: Normal heart sounds.  Pulmonary:     Effort: Pulmonary effort is normal.     Breath sounds: Normal breath sounds. No wheezing, rhonchi or rales.  Abdominal:     General: Bowel sounds are normal. There is no  distension.     Palpations: Abdomen is soft.  Skin:    General: Skin is warm and dry.  Neurological:     Mental Status: He is alert and oriented to person, place, and time.  Psychiatric:        Mood and Affect: Mood normal.        Behavior: Behavior normal.    MD Evaluation Airway: WNL Heart: WNL Abdomen: WNL Chest/ Lungs: WNL ASA  Classification: 3 Mallampati/Airway Score: Two  Imaging: No results found.  Labs:  CBC: Recent Labs    04/28/20 0826 06/19/20 1104 10/30/20 0937 11/06/20 0830  WBC 6.1 7.4 9.4 7.3  HGB 15.3 15.6 15.4 14.7  HCT 43.8 43.2 41.9 40.1  PLT 370 341 323 282    COAGS: No results for input(s): INR, APTT in the last 8760 hours.  BMP: Recent Labs    12/17/19 1158 04/28/20 0826 06/19/20 1104 10/30/20 0937 11/06/20 0830  NA 130* 130* 130* 127* 127*  K 4.3 5.2 4.6 4.3 4.6  CL 96* 94* 97* 90* 94*  CO2 '27 26 23 22 24  ' GLUCOSE 105* 101* 95 136* 109*  BUN 5* 4* <4* <4* 3*  CALCIUM 9.4 9.6 9.8 9.2 9.5  CREATININE 0.84 0.76 0.83 0.86 0.82  GFRNONAA >60  --  >60 >60 >60  GFRAA >60  --   --   --   --     LIVER FUNCTION TESTS: Recent Labs    06/19/20 1104 08/25/20 1201 10/30/20 0937 11/06/20 0830  BILITOT 0.6 0.7 1.0 0.7  AST 41 48* 72* 63*  ALT 22 26 37 32  ALKPHOS 76 120 139* 117  PROT 8.0 7.7 7.8 7.0  ALBUMIN 4.2 4.6 3.9 3.9    TUMOR MARKERS: No results for input(s): AFPTM, CEA, CA199, CHROMGRNA in the last  8760 hours.  Assessment and Plan: 65 y.o. male with metastatic lung cancer.   Patient was originally diagnosed with lung cancer in 2020 and has been followed by Dr. Earlie Server at the cancer center.  Patient underwent MRI of left shoulder due to left shoulder pain which revealed findings highly suspicious for metastatic lung cancer.  Patient underwent PET scan and ultrasound-guided core biopsy of left supraclavicular lymph node, which pathology revealed a metastatic carcinoma.  After thorough discussion and shared decision-making with his oncology team, patient decided to undergo a palliative systemic chemotherapy. IR was requested for image guided Port-A-Cath placement. Patient presents to IR today for the procedure. N.p.o.  Since 7 AM this morning Afebrile On Plavix, no need for discontinuation per IR anticoagulation guideline.  Risks and benefits of image guided port-a-catheter placement was discussed with the patient including, but not limited to bleeding, infection, pneumothorax, or fibrin sheath development and need for additional procedures.  All of the patient's questions were answered, patient is agreeable to proceed. Consent signed and in chart.    Thank you for this interesting consult.  I greatly enjoyed meeting JHALEN ELEY and look forward to participating in their care.  A copy of this report was sent to the requesting provider on this date.  Electronically Signed: Tera Mater, PA-C 11/13/2020, 12:37 PM   I spent a total of    15 Minutes in face to face in clinical consultation, greater than 50% of which was counseling/coordinating care for image guided port-a-catheter placement.

## 2020-11-14 ENCOUNTER — Encounter: Payer: Self-pay | Admitting: Internal Medicine

## 2020-11-14 ENCOUNTER — Inpatient Hospital Stay: Payer: BC Managed Care – PPO

## 2020-11-14 ENCOUNTER — Inpatient Hospital Stay (HOSPITAL_BASED_OUTPATIENT_CLINIC_OR_DEPARTMENT_OTHER): Payer: BC Managed Care – PPO | Admitting: Internal Medicine

## 2020-11-14 VITALS — BP 122/83 | HR 77 | Temp 97.8°F | Resp 16 | Ht 68.5 in | Wt 136.6 lb

## 2020-11-14 DIAGNOSIS — C7951 Secondary malignant neoplasm of bone: Secondary | ICD-10-CM | POA: Diagnosis not present

## 2020-11-14 DIAGNOSIS — Z79899 Other long term (current) drug therapy: Secondary | ICD-10-CM | POA: Diagnosis not present

## 2020-11-14 DIAGNOSIS — Z5112 Encounter for antineoplastic immunotherapy: Secondary | ICD-10-CM

## 2020-11-14 DIAGNOSIS — C3411 Malignant neoplasm of upper lobe, right bronchus or lung: Secondary | ICD-10-CM | POA: Diagnosis not present

## 2020-11-14 DIAGNOSIS — C770 Secondary and unspecified malignant neoplasm of lymph nodes of head, face and neck: Secondary | ICD-10-CM | POA: Diagnosis not present

## 2020-11-14 DIAGNOSIS — Z5111 Encounter for antineoplastic chemotherapy: Secondary | ICD-10-CM

## 2020-11-14 DIAGNOSIS — C3491 Malignant neoplasm of unspecified part of right bronchus or lung: Secondary | ICD-10-CM

## 2020-11-14 DIAGNOSIS — Z902 Acquired absence of lung [part of]: Secondary | ICD-10-CM | POA: Diagnosis not present

## 2020-11-14 DIAGNOSIS — R110 Nausea: Secondary | ICD-10-CM | POA: Diagnosis not present

## 2020-11-14 LAB — CMP (CANCER CENTER ONLY)
ALT: 100 U/L — ABNORMAL HIGH (ref 0–44)
AST: 85 U/L — ABNORMAL HIGH (ref 15–41)
Albumin: 3.6 g/dL (ref 3.5–5.0)
Alkaline Phosphatase: 101 U/L (ref 38–126)
Anion gap: 13 (ref 5–15)
BUN: 8 mg/dL (ref 8–23)
CO2: 27 mmol/L (ref 22–32)
Calcium: 9.5 mg/dL (ref 8.9–10.3)
Chloride: 89 mmol/L — ABNORMAL LOW (ref 98–111)
Creatinine: 0.79 mg/dL (ref 0.61–1.24)
GFR, Estimated: >60 mL/min (ref >60)
Glucose, Bld: 133 mg/dL — ABNORMAL HIGH (ref 70–99)
Potassium: 3.4 mmol/L — ABNORMAL LOW (ref 3.5–5.1)
Sodium: 129 mmol/L — ABNORMAL LOW (ref 135–145)
Total Bilirubin: 0.9 mg/dL (ref 0.3–1.2)
Total Protein: 7.3 g/dL (ref 6.5–8.1)

## 2020-11-14 LAB — CBC WITH DIFFERENTIAL (CANCER CENTER ONLY)
Abs Immature Granulocytes: 0.01 10*3/uL (ref 0.00–0.07)
Basophils Absolute: 0 10*3/uL (ref 0.0–0.1)
Basophils Relative: 1 %
Eosinophils Absolute: 0.1 10*3/uL (ref 0.0–0.5)
Eosinophils Relative: 2 %
HCT: 36.9 % — ABNORMAL LOW (ref 39.0–52.0)
Hemoglobin: 13.3 g/dL (ref 13.0–17.0)
Immature Granulocytes: 0 %
Lymphocytes Relative: 28 %
Lymphs Abs: 0.9 10*3/uL (ref 0.7–4.0)
MCH: 35.2 pg — ABNORMAL HIGH (ref 26.0–34.0)
MCHC: 36 g/dL (ref 30.0–36.0)
MCV: 97.6 fL (ref 80.0–100.0)
Monocytes Absolute: 0.4 10*3/uL (ref 0.1–1.0)
Monocytes Relative: 11 %
Neutro Abs: 1.9 10*3/uL (ref 1.7–7.7)
Neutrophils Relative %: 58 %
Platelet Count: 187 10*3/uL (ref 150–400)
RBC: 3.78 MIL/uL — ABNORMAL LOW (ref 4.22–5.81)
RDW: 12.1 % (ref 11.5–15.5)
WBC Count: 3.3 10*3/uL — ABNORMAL LOW (ref 4.0–10.5)
nRBC: 0 % (ref 0.0–0.2)

## 2020-11-14 NOTE — Progress Notes (Signed)
Sean Rose [phone]   [phone]  OFFICE PROGRESS NOTE  [name], NP [place] [address]  DIAGNOSIS: Metastatic non-small cell lung cancer initially diagnosed as stage IIIA (T3, N1, M0) non-small cell lung cancer, adenocarcinoma with no actionable mutations presented with multiple pulmonary nodules in the right upper lobe as well as metastatic disease in intraparenchymal lymphadenopathy.  This was diagnosed in February 2020.  The patient has disease recurrence in March 2022. PD-L1 expression 20%. He has no actionable mutations by foundation 1.  PRIOR THERAPY: 1) Status post right upper lobectomy with lymph node dissection. 2) Adjuvant systemic chemotherapy with cisplatin 75 mg/M2 and Alimta 500 mg/M2 every 3 weeks.  Status post 4 cycles. 3) palliative radiotherapy to the metastatic bone disease and the scapula as well as the left supraclavicular area under the care of Dr. Isidore Moos  CURRENT THERAPY: Systemic chemotherapy with carboplatin for AUC of 5, Alimta 500 mg/M2 and Keytruda 200 mg IV every 3 weeks.  First dose November 06, 2020.  Status post 1 cycle  INTERVAL HISTORY: Sean Rose 65 y.o. male returns to the clinic today for follow-up visit.  The patient is feeling fine today.  He has a rough time the first few days after the treatment with nausea and vomiting as well as abdominal bloating.  He also had constipation followed by diarrhea after he take laxative.  He used Compazine and was hallucinating and confused after the Compazine treatment.  He had a Port-A-Cath placed recently.  He denied having any current chest pain, shortness of breath, cough or hemoptysis.  He denied having any fever or chills.  He has no current nausea, vomiting, diarrhea or constipation.  He did not have any significant weight loss.  He was able to go back to work yesterday.  He is here today for evaluation and repeat blood work.   MEDICAL  HISTORY: Past Medical History:  Diagnosis Date  . Anginal pain ([place])   . CAD (coronary artery disease)   . CAP (community acquired pneumonia) 09/2016  . COPD (chronic obstructive pulmonary disease) ([place])   . GERD (gastroesophageal reflux disease)   . Heart murmur    "I was told I had one when I was a kid"  . Hemorrhoids   . History of anal fissures    "no surgeries" (10/30/2016)  . Hyperlipidemia   . Hypertension   . lung ca dx'd 08/2018  . Peripheral arterial disease ([place])    status post right common iliac artery stenting back in 2007  . Seasonal allergies   . Tobacco abuse     ALLERGIES:  has No Known Allergies.  MEDICATIONS:  Current Outpatient Medications  Medication Sig Dispense Refill  . amLODipine (NORVASC) 10 MG tablet TAKE 1 TABLET(10 MG) BY MOUTH DAILY (Patient taking differently: Take 10 mg by mouth daily.) 90 tablet 3  . aspirin EC 81 MG tablet Take 81 mg by mouth daily.    . clopidogrel (PLAVIX) 75 MG tablet TAKE 1 TABLET BY MOUTH EVERY DAY (Patient taking differently: Take 75 mg by mouth daily.) 90 tablet 3  . diphenhydrAMINE (BENADRYL) 25 mg capsule Take 25 mg by mouth daily as needed for allergies.     Marland Kitchen esomeprazole (NEXIUM) 20 MG capsule Take 40 mg by mouth at bedtime.    . folic acid (FOLVITE) 1 MG tablet Take 1 tablet (1 mg total) by mouth daily. 30 tablet 4  . ibuprofen (ADVIL,MOTRIN) 200  MG tablet Take 400 mg by mouth every 6 (six) hours as needed for headache or moderate pain.     Marland Kitchen lidocaine-prilocaine (EMLA) cream Apply to the Port-A-Cath site 30-60 minutes before treatment. 30 g 0  . Magnesium 250 MG TABS Take 250 mg by mouth daily.    . metoprolol succinate (TOPROL-XL) 100 MG 24 hr tablet TAKE 1 TABLET BY MOUTH EVERY DAY 90 tablet 3  . Multiple Vitamin (MULTIVITAMIN WITH MINERALS) TABS tablet Take 1 tablet by mouth daily.    . nitroGLYCERIN (NITROSTAT) 0.4 MG SL tablet PLACE 1 TABLET UNDER THE TONGUE EVERY 5 MINUTES AS NEEDED FOR CHEST PAIN. (Patient  taking differently: Place 0.4 mg under the tongue every 5 (five) minutes as needed.) 25 tablet 6  . prochlorperazine (COMPAZINE) 10 MG tablet Take 1 tablet (10 mg total) by mouth every 6 (six) hours as needed for nausea or vomiting. 30 tablet 0  . rosuvastatin (CRESTOR) 5 MG tablet Take 1 tablet (5 mg total) by mouth every other day. 45 tablet 3  . umeclidinium-vilanterol (ANORO ELLIPTA) 62.5-25 MCG/INH AEPB Inhale 1 puff into the lungs daily. 60 each 5   No current facility-administered medications for this visit.    SURGICAL HISTORY:  Past Surgical History:  Procedure Laterality Date  . ANKLE SURGERY Left    "rebuilt it"  . ANTERIOR CRUCIATE LIGAMENT REPAIR Right   . CARDIAC CATHETERIZATION  09/23/2008   Continued medical therapy - may need GI evaluation in addition.  Marland Kitchen CARDIAC CATHETERIZATION  10/28/2007   Medical therapy recommended.  Marland Kitchen CARDIAC CATHETERIZATION  11/18/2006   In-stent restenosis RCA  (50% distal edge, 80% segmental mid, and 50-60% segmental proximal). Successful cutting balloon atherectomy using a [id number] cutting balloon. 3 inflations with atherectomy performed on mid and proximal portions resulting in reduction of 80% mid in-stent restenosis to less than 20% residual and 50-60% segmental proximal to less than 20% residual without dissection.  Marland Kitchen CARDIAC CATHETERIZATION  02/26/2006   Severe stenosis in RCA. Stenting performed using IVUS. 3.5x20 Maverick balloon deployed at [employer]. Distal stent-a 4x28 Liberte stent-deployed 12atm 48sec, 12atm 31sec, 4atm 19sec. Mid stent-a 4x28 Liberte stent-deployed 14atm 45sec, 14atm 60sec, 14atm 44sec. Proximal stent-4x8 Liberte- 14atm 45sec,14atm 47sec, 16atm 43sec. Severely diseased segment then appeared TIMI-3 flow.  Marland Kitchen CARDIOVASCULAR STRESS TEST  11/17/2012   No significant ECG changes. Septal perfusion defect is new when complared to study from 2010. Abnormal myocardial perfusion imaging with a basal to mid perfusion suggestive of previous  MI.  Marland Kitchen CAROTID DOPPLER  08/09/2011   Bilateral Bulb/Proximal ICA - demonstrated a mild amount of fibrous plaque without evidence of significant diameter reduction reduction or other vascular abnormality.  . CHEST TUBE INSERTION Right 09/02/2018   Procedure: Chest Tube Insertion;  Surgeon: Garner Nash, DO;  Location: [facility];  Service: Thoracic;  Laterality: Right;  . COLONOSCOPY     2003, 2014  . CORONARY ANGIOPLASTY WITH STENT PLACEMENT    . FEMORAL ARTERY STENT    . INGUINAL HERNIA REPAIR Right   . IR IMAGING GUIDED PORT INSERTION  11/13/2020  . KNEE ARTHROSCOPY Right "multiple"  . LAPAROSCOPIC APPENDECTOMY N/A 12/07/2018   Procedure: APPENDECTOMY LAPAROSCOPIC;  Surgeon: Coralie Keens, MD;  Location: [facility];  Service: General;  Laterality: N/A;  . LOWER EXTREMITY ARTERIAL DOPPLER  01/31/2011   Bilateral ABIs-normal values with no suggestion of arterial insuff to the lower extremities at rest. Right CIA stent-mild amount of nonhemodynamically significant plaque is noted throughout  .  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 PERCUTANEOUS STENT INTERVENTION  04/04/2006 & 04/13/2015   a. Right common iliac artery with an 8.0x18 mm Herculink stent deployed at 12 atm. Stenosis was reduced from 80% to 0% with brisk flow. b. I-cast stenting to left common iliac artery  . PERIPHERAL VASCULAR CATHETERIZATION N/A 04/13/2015   Procedure: Lower Extremity Angiography;  Surgeon: Lorretta Harp, MD; L-oCIA 75%, 40-50% L-EIA, R-CIA stent patent, s/p 8 mm x 38 mm ICast covered stent>>0% stenosis in L-oCIA     . SHOULDER ARTHROSCOPY WITH ROTATOR CUFF REPAIR Right   . TRANSTHORACIC ECHOCARDIOGRAM  11/26/2012   EF not noted. Aortic valve-sclerosis without stenosis, no regurgiation.   Marland Kitchen UPPER GASTROINTESTINAL ENDOSCOPY    . US CAROTID DOPPLER BILATERAL ([place] HX)  08/09/2011   Bilateral Bulb/Proximal ICAa demonstrated a mild amount of fibrous plaque without evidence of significant diameter reduction or any  other vascular abnormality.  Marland Kitchen VIDEO ASSISTED THORACOSCOPY (VATS)/ LOBECTOMY Right 09/14/2018   Procedure: RIGHT VIDEO ASSISTED THORACOSCOPY (VATS)/ RIGHT UPPER LOBECTOMY;  Surgeon: Melrose Nakayama, MD;  Location: [facility];  Service: Thoracic;  Laterality: Right;  Marland Kitchen VIDEO BRONCHOSCOPY WITH ENDOBRONCHIAL NAVIGATION N/A 09/02/2018   Procedure: VIDEO BRONCHOSCOPY WITH ENDOBRONCHIAL NAVIGATION;  Surgeon: Garner Nash, DO;  Location: [facility];  Service: Thoracic;  Laterality: N/A;    REVIEW OF SYSTEMS:  Constitutional: positive for fatigue Eyes: negative Ears, nose, mouth, throat, and face: negative Respiratory: negative Cardiovascular: negative Gastrointestinal: negative Genitourinary:negative Integument/breast: negative Hematologic/lymphatic: negative Musculoskeletal:positive for bone pain Neurological: negative Behavioral/Psych: negative Endocrine: negative Allergic/Immunologic: negative   PHYSICAL EXAMINATION: General appearance: alert, cooperative, fatigued and no distress Head: Normocephalic, without obvious abnormality, atraumatic Neck: no adenopathy, no JVD, supple, symmetrical, trachea midline and thyroid not enlarged, symmetric, no tenderness/mass/nodules Lymph nodes: Cervical, supraclavicular, and axillary nodes normal. Resp: clear to auscultation bilaterally Back: symmetric, no curvature. ROM normal. No CVA tenderness. Cardio: regular rate and rhythm, S1, S2 normal, no murmur, click, rub or gallop GI: soft, non-tender; bowel sounds normal; no masses,  no organomegaly Extremities: extremities normal, atraumatic, no cyanosis or edema Neurologic: Alert and oriented X 3, normal strength and tone. Normal symmetric reflexes. Normal coordination and gait  ECOG PERFORMANCE STATUS: 1 - Symptomatic but completely ambulatory  Blood pressure 122/83, pulse 77, temperature 97.8 F (36.6 C), temperature source Tympanic, resp. rate 16, height 5' 8.5" (1.74 m), weight 136 lb 9.6 oz (62  kg), SpO2 100 %.  LABORATORY DATA: Lab Results  Component Value Date   WBC 7.3 11/06/2020   HGB 14.7 11/06/2020   HCT 40.1 11/06/2020   MCV 95.2 11/06/2020   PLT 282 11/06/2020      Chemistry      Component Value Date/Time   NA 127 (L) 11/06/2020 0830   NA 130 (L) 03/05/2017 1110   K 4.6 11/06/2020 0830   CL 94 (L) 11/06/2020 0830   CO2 24 11/06/2020 0830   BUN 3 (L) 11/06/2020 0830   BUN 4 (L) 03/05/2017 1110   CREATININE 0.82 11/06/2020 0830   CREATININE 0.76 04/28/2020 0826      Component Value Date/Time   CALCIUM 9.5 11/06/2020 0830   ALKPHOS 117 11/06/2020 0830   AST 63 (H) 11/06/2020 0830   ALT 32 11/06/2020 0830   BILITOT 0.7 11/06/2020 0830       RADIOGRAPHIC STUDIES: IR IMAGING GUIDED PORT INSERTION  Result Date: 11/13/2020 CLINICAL DATA:  Metastatic lung carcinoma, needs durable venous access for planned treatment regimen.  EXAM: TUNNELED PORT CATHETER PLACEMENT WITH ULTRASOUND AND FLUOROSCOPIC GUIDANCE FLUOROSCOPY TIME:  12 seconds; 1 mGy ANESTHESIA/SEDATION: Intravenous Fentanyl 159mg and Versed 336mwere administered as conscious sedation during continuous monitoring of the patient's level of consciousness and physiological / cardiorespiratory status by the radiology RN, with a total moderate sedation time of 14 minutes. TECHNIQUE: The procedure, risks, benefits, and alternatives were explained to the patient. Questions regarding the procedure were encouraged and answered. The patient understands and consents to the procedure. Patency of the right IJ vein was confirmed with ultrasound with image documentation. An appropriate skin site was determined. Skin site was marked. Region was prepped using maximum barrier technique including cap and mask, sterile gown, sterile gloves, large sterile sheet, and Chlorhexidine as cutaneous antisepsis. The region was infiltrated locally with 1% lidocaine. Under real-time ultrasound guidance, the right IJ vein was accessed with a 21  gauge micropuncture needle; the needle tip within the vein was confirmed with ultrasound image documentation. Needle was exchanged over a 018 guidewire for transitional dilator, and vascular measurement was performed. A small incision was made on the right anterior chest wall and a subcutaneous pocket fashioned. The power-injectable low-profile port was positioned and its catheter tunneled to the right IJ dermatotomy site. The transitional dilator was exchanged over an Amplatz wire for a peel-away sheath, through which the port catheter, which had been trimmed to the appropriate length, was advanced and positioned under fluoroscopy with its tip at the cavoatrial junction. Spot chest radiograph confirms good catheter position and no pneumothorax. The port was flushed per protocol. The pocket was closed with deep interrupted and subcuticular continuous 3-0 Monocryl sutures. The incisions were covered with Dermabond then covered with a sterile dressing. The patient tolerated the procedure well. COMPLICATIONS: COMPLICATIONS None immediate IMPRESSION: Technically successful right IJ power-injectable port catheter placement. Ready for routine use. Electronically Signed   By: D Lucrezia Europe.D.   On: 11/13/2020 16:56    ASSESSMENT AND PLAN: This is a very pleasant 89ears old white male with a stage IIIB non-small cell lung cancer, adenocarcinoma with no actionable mutations status post right upper lobectomy with lymph node dissection under the care of Dr. HeRoxan HockeyThe patient completed a course of adjuvant treatment with cisplatin and Alimta status post 4 cycles.  He tolerated the previous 4 cycles of his treatment fairly well. The patient has been in observation but the recent imaging studies showed suspicious bone lesion in the left shoulder area. I ordered a PET scan which was performed on 09/25/2020 and it showed metastatic disease involving the left scapula and left supraclavicular lymph nodes. The patient  had ultrasound-guided core biopsy of the left supraclavicular lymph node and it was positive for metastatic carcinoma. He underwent palliative radiotherapy to the metastatic bone lesion in the left scapula and he is feeling much better. PD-L1 expression 20%. He was tested in the past for molecular study by foundation 1 and it was reported to be negative for actionable mutations. The patient had a lot of question about his current condition and survival and prognosis.  I explained to the patient again that he has incurable condition and all the treatment are of palliative nature.  I also gave the patient the average survival for his condition with the current treatment. For the nausea I will give him prescription for Zofran as needed after the treatment. He will come back for follow-up visit in 2 weeks for evaluation before starting cycle #2. He was advised to call immediately if  he has any other concerning symptoms in the interval. The total time spent in the appointment was 40 minutes.  The patient voices understanding of current disease status and treatment options and is in agreement with the current care plan. All questions were answered. The patient knows to call the clinic with any problems, questions or concerns. We can certainly see the patient much sooner if necessary.  Disclaimer: This note was dictated with voice recognition software. Similar sounding words can inadvertently be transcribed and may not be corrected upon review.

## 2020-11-21 ENCOUNTER — Telehealth: Payer: Self-pay

## 2020-11-21 ENCOUNTER — Inpatient Hospital Stay: Payer: BC Managed Care – PPO

## 2020-11-21 ENCOUNTER — Other Ambulatory Visit: Payer: BC Managed Care – PPO

## 2020-11-21 ENCOUNTER — Other Ambulatory Visit: Payer: Self-pay

## 2020-11-21 DIAGNOSIS — Z79899 Other long term (current) drug therapy: Secondary | ICD-10-CM | POA: Diagnosis not present

## 2020-11-21 DIAGNOSIS — C3411 Malignant neoplasm of upper lobe, right bronchus or lung: Secondary | ICD-10-CM | POA: Diagnosis not present

## 2020-11-21 DIAGNOSIS — R110 Nausea: Secondary | ICD-10-CM | POA: Diagnosis not present

## 2020-11-21 DIAGNOSIS — Z5112 Encounter for antineoplastic immunotherapy: Secondary | ICD-10-CM | POA: Diagnosis not present

## 2020-11-21 DIAGNOSIS — C770 Secondary and unspecified malignant neoplasm of lymph nodes of head, face and neck: Secondary | ICD-10-CM | POA: Diagnosis not present

## 2020-11-21 DIAGNOSIS — C7951 Secondary malignant neoplasm of bone: Secondary | ICD-10-CM | POA: Diagnosis not present

## 2020-11-21 DIAGNOSIS — Z902 Acquired absence of lung [part of]: Secondary | ICD-10-CM | POA: Diagnosis not present

## 2020-11-21 DIAGNOSIS — Z5111 Encounter for antineoplastic chemotherapy: Secondary | ICD-10-CM | POA: Diagnosis not present

## 2020-11-21 DIAGNOSIS — Z95828 Presence of other vascular implants and grafts: Secondary | ICD-10-CM | POA: Insufficient documentation

## 2020-11-21 DIAGNOSIS — C3491 Malignant neoplasm of unspecified part of right bronchus or lung: Secondary | ICD-10-CM

## 2020-11-21 LAB — CBC WITH DIFFERENTIAL (CANCER CENTER ONLY)
Abs Immature Granulocytes: 0.01 10*3/uL (ref 0.00–0.07)
Basophils Absolute: 0 10*3/uL (ref 0.0–0.1)
Basophils Relative: 0 %
Eosinophils Absolute: 0.1 10*3/uL (ref 0.0–0.5)
Eosinophils Relative: 2 %
HCT: 31.3 % — ABNORMAL LOW (ref 39.0–52.0)
Hemoglobin: 11.2 g/dL — ABNORMAL LOW (ref 13.0–17.0)
Immature Granulocytes: 0 %
Lymphocytes Relative: 42 %
Lymphs Abs: 1.8 10*3/uL (ref 0.7–4.0)
MCH: 35.8 pg — ABNORMAL HIGH (ref 26.0–34.0)
MCHC: 35.8 g/dL (ref 30.0–36.0)
MCV: 100 fL (ref 80.0–100.0)
Monocytes Absolute: 0.8 10*3/uL (ref 0.1–1.0)
Monocytes Relative: 19 %
Neutro Abs: 1.6 10*3/uL — ABNORMAL LOW (ref 1.7–7.7)
Neutrophils Relative %: 37 %
Platelet Count: 326 10*3/uL (ref 150–400)
RBC: 3.13 MIL/uL — ABNORMAL LOW (ref 4.22–5.81)
RDW: 13 % (ref 11.5–15.5)
WBC Count: 4.3 10*3/uL (ref 4.0–10.5)
nRBC: 0 % (ref 0.0–0.2)

## 2020-11-21 LAB — CMP (CANCER CENTER ONLY)
ALT: 50 U/L — ABNORMAL HIGH (ref 0–44)
AST: 38 U/L (ref 15–41)
Albumin: 3.4 g/dL — ABNORMAL LOW (ref 3.5–5.0)
Alkaline Phosphatase: 82 U/L (ref 38–126)
Anion gap: 9 (ref 5–15)
BUN: 9 mg/dL (ref 8–23)
CO2: 25 mmol/L (ref 22–32)
Calcium: 8.8 mg/dL — ABNORMAL LOW (ref 8.9–10.3)
Chloride: 96 mmol/L — ABNORMAL LOW (ref 98–111)
Creatinine: 0.67 mg/dL (ref 0.61–1.24)
GFR, Estimated: >60 mL/min (ref >60)
Glucose, Bld: 110 mg/dL — ABNORMAL HIGH (ref 70–99)
Potassium: 4.2 mmol/L (ref 3.5–5.1)
Sodium: 130 mmol/L — ABNORMAL LOW (ref 135–145)
Total Bilirubin: 0.3 mg/dL (ref 0.3–1.2)
Total Protein: 6.8 g/dL (ref 6.5–8.1)

## 2020-11-21 MED ORDER — SODIUM CHLORIDE 0.9% FLUSH
10.0000 mL | Freq: Once | INTRAVENOUS | Status: AC
  Administered 2020-11-21: 10 mL
  Filled 2020-11-21: qty 10

## 2020-11-21 MED ORDER — HEPARIN SOD (PORK) LOCK FLUSH 100 UNIT/ML IV SOLN
500.0000 [IU] | Freq: Once | INTRAVENOUS | Status: AC
  Administered 2020-11-21: 500 [IU]
  Filled 2020-11-21: qty 5

## 2020-11-21 NOTE — Telephone Encounter (Signed)
Spoke with pt regarding his question about using emla cream on his port-a-cath. Instructed pt to wait 14 days after port-a-cath insertion before using his elma cream. Pt verbalizes understanding and agrees with plan of care.

## 2020-11-22 ENCOUNTER — Other Ambulatory Visit: Payer: Self-pay | Admitting: Internal Medicine

## 2020-11-22 MED ORDER — ONDANSETRON HCL 8 MG PO TABS: 8.0000 mg | ORAL_TABLET | Freq: Three times a day (TID) | ORAL | 0 refills | Status: DC | PRN

## 2020-11-23 ENCOUNTER — Other Ambulatory Visit: Payer: Self-pay

## 2020-11-23 MED ORDER — ANORO ELLIPTA 62.5-25 MCG/INH IN AEPB: 1.0000 | INHALATION_SPRAY | Freq: Every day | RESPIRATORY_TRACT | 5 refills | Status: DC

## 2020-11-23 NOTE — Progress Notes (Signed)
[facility] OFFICE PROGRESS NOTE  [name], NP [place] [address]  DIAGNOSIS: Metastatic non-small cell lung cancer initially diagnosed as stage IIIA (T3, N1, M0) non-small cell lung cancer, adenocarcinoma with no actionable mutations presented with multiple pulmonary nodules in the right upper lobe as well as metastatic disease in intraparenchymal lymphadenopathy. This was diagnosed in February 2020.  The patient has disease recurrence in March 2022. PD-L1 expression 20%. He has no actionable mutations by foundation 1.  PRIOR THERAPY:  1) Status post right upper lobectomy with lymph node dissection. 2) Adjuvant systemic chemotherapy with cisplatin 75 mg/M2 and Alimta 500 mg/M2 every 3 weeks.  Status post 4 cycles. 3) palliative radiotherapy to the metastatic bone disease and the scapula as well as the left supraclavicular area under the care of Dr. Isidore Moos  CURRENT THERAPY: Systemic chemotherapy with carboplatin for AUC of 5, Alimta 500 mg/M2 and Keytruda 200 mg IV every 3 weeks.  First dose November 06, 2020.  Status post 1 cycle.   INTERVAL HISTORY: Sean Rose 65 y.o. male returns to the clinic today for a follow up appointment. The patient recently was found to have evidence of disease progression with metastatic disease.  The patient started systemic chemotherapy and is status post 1 cycle and had nausea, constipation/diarrhea, and abdominal bloating with his first cycle of treatment.  He also had small hallucinations secondary to Compazine and was hyperactive per his wife. He feels better at this time.  Otherwise he denies any fever, chills, night sweats, or weight loss.  He denies any diarrhea or constipation but had constipation after treatment and took Doculax and then had dairrhea.  He denies any headache or visual changes.  He denies any chest pain, hemoptysis, or shortness of breath.  He has a mild dry cough which he attributes to post  nasal drainage. He takes benadryl for allergies. He also has some tingling down his left arm which is likely due to the metastatic bone lesion in the left supraclavicular area. This has been present since he was found to have the metastatic lesion in this area. He states he can tolerate the numbness but just wanted to mention it. He received palliative radiation to this lesion recently. He discussed this at length with radiation oncology at his recent appointment. He is here today for evaluation before starting cycle #2.   MEDICAL HISTORY: Past Medical History:  Diagnosis Date  . Anginal pain ([employer])   . CAD (coronary artery disease)   . CAP (community acquired pneumonia) 09/2016  . COPD (chronic obstructive pulmonary disease) ([place])   . GERD (gastroesophageal reflux disease)   . Heart murmur    "I was told I had one when I was a kid"  . Hemorrhoids   . History of anal fissures    "no surgeries" (10/30/2016)  . Hyperlipidemia   . Hypertension   . lung ca dx'd 08/2018  . Peripheral arterial disease ([place])    status post right common iliac artery stenting back in 2007  . Seasonal allergies   . Tobacco abuse     ALLERGIES:  is allergic to compazine [prochlorperazine].  MEDICATIONS:  Current Outpatient Medications  Medication Sig Dispense Refill  . amLODipine (NORVASC) 10 MG tablet TAKE 1 TABLET(10 MG) BY MOUTH DAILY (Patient taking differently: Take 10 mg by mouth daily.) 90 tablet 3  . aspirin EC 81 MG tablet Take 81 mg by mouth daily.    . clopidogrel (PLAVIX) 75 MG  tablet TAKE 1 TABLET BY MOUTH EVERY DAY (Patient taking differently: Take 75 mg by mouth daily.) 90 tablet 3  . diphenhydrAMINE (BENADRYL) 25 mg capsule Take 25 mg by mouth daily as needed for allergies.     Marland Kitchen esomeprazole (NEXIUM) 20 MG capsule Take 40 mg by mouth at bedtime.    . folic acid (FOLVITE) 1 MG tablet Take 1 tablet (1 mg total) by mouth daily. 30 tablet 4  . ibuprofen (ADVIL,MOTRIN) 200 MG tablet Take 400 mg by  mouth every 6 (six) hours as needed for headache or moderate pain.     Marland Kitchen lidocaine-prilocaine (EMLA) cream Apply to the Port-A-Cath site 30-60 minutes before treatment. 30 g 0  . Magnesium 250 MG TABS Take 250 mg by mouth daily.    . metoprolol succinate (TOPROL-XL) 100 MG 24 hr tablet TAKE 1 TABLET BY MOUTH EVERY DAY 90 tablet 3  . Multiple Vitamin (MULTIVITAMIN WITH MINERALS) TABS tablet Take 1 tablet by mouth daily.    . nitroGLYCERIN (NITROSTAT) 0.4 MG SL tablet PLACE 1 TABLET UNDER THE TONGUE EVERY 5 MINUTES AS NEEDED FOR CHEST PAIN. (Patient not taking: Reported on 11/14/2020) 25 tablet 6  . ondansetron (ZOFRAN) 8 MG tablet Take 1 tablet (8 mg total) by mouth every 8 (eight) hours as needed for nausea or vomiting. 20 tablet 0  . rosuvastatin (CRESTOR) 5 MG tablet Take 1 tablet (5 mg total) by mouth every other day. 45 tablet 3  . umeclidinium-vilanterol (ANORO ELLIPTA) 62.5-25 MCG/INH AEPB Inhale 1 puff into the lungs daily. 60 each 5   No current facility-administered medications for this visit.    SURGICAL HISTORY:  Past Surgical History:  Procedure Laterality Date  . ANKLE SURGERY Left    "rebuilt it"  . ANTERIOR CRUCIATE LIGAMENT REPAIR Right   . CARDIAC CATHETERIZATION  09/23/2008   Continued medical therapy - may need GI evaluation in addition.  Marland Kitchen CARDIAC CATHETERIZATION  10/28/2007   Medical therapy recommended.  Marland Kitchen CARDIAC CATHETERIZATION  11/18/2006   In-stent restenosis RCA  (50% distal edge, 80% segmental mid, and 50-60% segmental proximal). Successful cutting balloon atherectomy using a [id number] cutting balloon. 3 inflations with atherectomy performed on mid and proximal portions resulting in reduction of 80% mid in-stent restenosis to less than 20% residual and 50-60% segmental proximal to less than 20% residual without dissection.  Marland Kitchen CARDIAC CATHETERIZATION  02/26/2006   Severe stenosis in RCA. Stenting performed using IVUS. 3.5x20 Maverick balloon deployed at [employer]. Distal  stent-a 4x28 Liberte stent-deployed 12atm 48sec, 12atm 31sec, 4atm 19sec. Mid stent-a 4x28 Liberte stent-deployed 14atm 45sec, 14atm 60sec, 14atm 44sec. Proximal stent-4x8 Liberte- 14atm 45sec,14atm 47sec, 16atm 43sec. Severely diseased segment then appeared TIMI-3 flow.  Marland Kitchen CARDIOVASCULAR STRESS TEST  11/17/2012   No significant ECG changes. Septal perfusion defect is new when complared to study from 2010. Abnormal myocardial perfusion imaging with a basal to mid perfusion suggestive of previous MI.  Marland Kitchen CAROTID DOPPLER  08/09/2011   Bilateral Bulb/Proximal ICA - demonstrated a mild amount of fibrous plaque without evidence of significant diameter reduction reduction or other vascular abnormality.  . CHEST TUBE INSERTION Right 09/02/2018   Procedure: Chest Tube Insertion;  Surgeon: Garner Nash, DO;  Location: [facility];  Service: Thoracic;  Laterality: Right;  . COLONOSCOPY     2003, 2014  . CORONARY ANGIOPLASTY WITH STENT PLACEMENT    . FEMORAL ARTERY STENT    . INGUINAL HERNIA REPAIR Right   . IR IMAGING GUIDED PORT INSERTION  11/13/2020  . KNEE ARTHROSCOPY Right "multiple"  . LAPAROSCOPIC APPENDECTOMY N/A 12/07/2018   Procedure: APPENDECTOMY LAPAROSCOPIC;  Surgeon: Coralie Keens, MD;  Location: [facility];  Service: General;  Laterality: N/A;  . LOWER EXTREMITY ARTERIAL DOPPLER  01/31/2011   Bilateral ABIs-normal values with no suggestion of arterial insuff to the lower extremities at rest. Right CIA stent-mild amount of nonhemodynamically significant plaque is noted throughout  . 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 PERCUTANEOUS STENT INTERVENTION  04/04/2006 & 04/13/2015   a. Right common iliac artery with an 8.0x18 mm Herculink stent deployed at 12 atm. Stenosis was reduced from 80% to 0% with brisk flow. b. I-cast stenting to left common iliac artery  . PERIPHERAL VASCULAR CATHETERIZATION N/A 04/13/2015   Procedure: Lower Extremity Angiography;  Surgeon: Lorretta Harp, MD; L-oCIA 75%,  40-50% L-EIA, R-CIA stent patent, s/p 8 mm x 38 mm ICast covered stent>>0% stenosis in L-oCIA     . SHOULDER ARTHROSCOPY WITH ROTATOR CUFF REPAIR Right   . TRANSTHORACIC ECHOCARDIOGRAM  11/26/2012   EF not noted. Aortic valve-sclerosis without stenosis, no regurgiation.   Marland Kitchen UPPER GASTROINTESTINAL ENDOSCOPY    . US CAROTID DOPPLER BILATERAL ([facility] HX)  08/09/2011   Bilateral Bulb/Proximal ICAa demonstrated a mild amount of fibrous plaque without evidence of significant diameter reduction or any other vascular abnormality.  Marland Kitchen VIDEO ASSISTED THORACOSCOPY (VATS)/ LOBECTOMY Right 09/14/2018   Procedure: RIGHT VIDEO ASSISTED THORACOSCOPY (VATS)/ RIGHT UPPER LOBECTOMY;  Surgeon: Melrose Nakayama, MD;  Location: [facility];  Service: Thoracic;  Laterality: Right;  Marland Kitchen VIDEO BRONCHOSCOPY WITH ENDOBRONCHIAL NAVIGATION N/A 09/02/2018   Procedure: VIDEO BRONCHOSCOPY WITH ENDOBRONCHIAL NAVIGATION;  Surgeon: Garner Nash, DO;  Location: [facility];  Service: Thoracic;  Laterality: N/A;    REVIEW OF SYSTEMS:   Review of Systems  Constitutional: Negative for appetite change, chills, fatigue, fever and unexpected weight change.  HENT: Negative for mouth sores, nosebleeds, sore throat and trouble swallowing.   Eyes: Negative for eye problems and icterus.  Respiratory: Positive for cough. Negative for hemoptysis, shortness of breath and wheezing.   Cardiovascular: Negative for chest pain and leg swelling.  Gastrointestinal: Negative for abdominal pain, constipation, diarrhea, nausea and vomiting (resolved).  Genitourinary: Negative for bladder incontinence, difficulty urinating, dysuria, frequency and hematuria.   Musculoskeletal: Positive for tingling in left arm. Negative for back pain, gait problem, neck pain and neck stiffness.  Skin: Negative for itching and rash.  Neurological: Negative for dizziness, extremity weakness, gait problem, headaches, light-headedness and seizures.  Hematological: Negative for  adenopathy. Does not bruise/bleed easily.  Psychiatric/Behavioral: Negative for confusion, depression and sleep disturbance. The patient is not nervous/anxious.     PHYSICAL EXAMINATION:  Blood pressure 130/87, pulse 73, temperature (!) 97.3 F (36.3 C), temperature source Tympanic, resp. rate 19, height 5' 8.5" (1.74 m), weight 139 lb 3.2 oz (63.1 kg), SpO2 100 %.  ECOG PERFORMANCE STATUS: 1 - Symptomatic but completely ambulatory  Physical Exam  Constitutional: Oriented to person, place, and time and well-developed, well-nourished, and in no distress.  HENT:  Head: Normocephalic and atraumatic.  Mouth/Throat: Oropharynx is clear and moist. No oropharyngeal exudate.  Eyes: Conjunctivae are normal. Right eye exhibits no discharge. Left eye exhibits no discharge. No scleral icterus.  Neck: Normal range of motion. Neck supple.  Cardiovascular: Normal rate, regular rhythm, normal heart sounds and intact distal pulses.   Pulmonary/Chest: Effort normal and breath sounds normal. No respiratory distress. No wheezes. No rales.  Abdominal: Soft.  Bowel sounds are normal. Exhibits no distension and no mass. There is no tenderness.  Musculoskeletal: Normal range of motion. Exhibits no edema.  Lymphadenopathy:    No cervical adenopathy.  Neurological: Alert and oriented to person, place, and time. Exhibits normal muscle tone. Gait normal. Coordination normal.  Skin: Skin is warm and dry. No rash noted. Not diaphoretic. No erythema. No pallor.  Psychiatric: Mood, memory and judgment normal.  Vitals reviewed.  LABORATORY DATA: Lab Results  Component Value Date   WBC 5.9 11/27/2020   HGB 11.8 (L) 11/27/2020   HCT 33.0 (L) 11/27/2020   MCV 100.3 (H) 11/27/2020   PLT 500 (H) 11/27/2020      Chemistry      Component Value Date/Time   NA 130 (L) 11/27/2020 0919   NA 130 (L) 03/05/2017 1110   K 4.0 11/27/2020 0919   CL 96 (L) 11/27/2020 0919   CO2 25 11/27/2020 0919   BUN 8 11/27/2020 0919    BUN 4 (L) 03/05/2017 1110   CREATININE 0.75 11/27/2020 0919   CREATININE 0.76 04/28/2020 0826      Component Value Date/Time   CALCIUM 9.0 11/27/2020 0919   ALKPHOS 83 11/27/2020 0919   AST 27 11/27/2020 0919   ALT 21 11/27/2020 0919   BILITOT 0.3 11/27/2020 0919       RADIOGRAPHIC STUDIES:  IR IMAGING GUIDED PORT INSERTION  Result Date: 11/13/2020 CLINICAL DATA:  Metastatic lung carcinoma, needs durable venous access for planned treatment regimen. EXAM: TUNNELED PORT CATHETER PLACEMENT WITH ULTRASOUND AND FLUOROSCOPIC GUIDANCE FLUOROSCOPY TIME:  12 seconds; 1 mGy ANESTHESIA/SEDATION: Intravenous Fentanyl 16mg and Versed 32mwere administered as conscious sedation during continuous monitoring of the patient's level of consciousness and physiological / cardiorespiratory status by the radiology RN, with a total moderate sedation time of 14 minutes. TECHNIQUE: The procedure, risks, benefits, and alternatives were explained to the patient. Questions regarding the procedure were encouraged and answered. The patient understands and consents to the procedure. Patency of the right IJ vein was confirmed with ultrasound with image documentation. An appropriate skin site was determined. Skin site was marked. Region was prepped using maximum barrier technique including cap and mask, sterile gown, sterile gloves, large sterile sheet, and Chlorhexidine as cutaneous antisepsis. The region was infiltrated locally with 1% lidocaine. Under real-time ultrasound guidance, the right IJ vein was accessed with a 21 gauge micropuncture needle; the needle tip within the vein was confirmed with ultrasound image documentation. Needle was exchanged over a 018 guidewire for transitional dilator, and vascular measurement was performed. A small incision was made on the right anterior chest wall and a subcutaneous pocket fashioned. The power-injectable low-profile port was positioned and its catheter tunneled to the right IJ  dermatotomy site. The transitional dilator was exchanged over an Amplatz wire for a peel-away sheath, through which the port catheter, which had been trimmed to the appropriate length, was advanced and positioned under fluoroscopy with its tip at the cavoatrial junction. Spot chest radiograph confirms good catheter position and no pneumothorax. The port was flushed per protocol. The pocket was closed with deep interrupted and subcuticular continuous 3-0 Monocryl sutures. The incisions were covered with Dermabond then covered with a sterile dressing. The patient tolerated the procedure well. COMPLICATIONS: COMPLICATIONS None immediate IMPRESSION: Technically successful right IJ power-injectable port catheter placement. Ready for routine use. Electronically Signed   By: D Lucrezia Europe.D.   On: 11/13/2020 16:56     ASSESSMENT/PLAN:  This is a very pleasant 64 year  old Caucasian male who was initially diagnosed with stage IIIB non-small cell lung cancer, adenocarcinoma. He is status post a right upper lobectomy with lymph node dissection under the care of [name].  He was negative for any actionable mutations.  He completed adjuvant chemotherapy with cisplatin and Alimta for 4 cycles.  He tolerated this fairly well.  His PD-L1 expression is 20%.   The patient had been on observation until imaging studies in February 2022 showed suspicious bone lesions in the left shoulder area.  A PET scan was performed on 09/25/2020 which showed metastatic disease involving the left scapula and left supraclavicular lymph nodes. The patient had a ultrasound-guided biopsy of the left supraclavicular lymph node and it was positive for metastatic carcinoma.  The patient then underwent palliative radiotherapy to the metastatic bone lesion in the left scapula under the care of Dr. Isidore Moos.   The patient is currently undergoing systemic chemotherapy with carboplatin for an AUC of 5, Alimta 500 mg per metered squared, Keytruda  20 mg IV every 3 weeks.  The patient is status post 1 cycle and had some nausea, constipation, diarrhea, and abdominal bloating with cycle #1.  The patient is feeling better at this time. We discussed how to take his anti-emetics. He had been confused with how to take his anti-emetics with his first cycle of treatment. He has compazine and zofran. Advised if he has nausea to use zofran after day 3 of chemotherapy. Discussed with him that he receives anti-emetics while in the clinic and he does not need to take compazine preventively prior to his infusions. Also advised to drink plenty of fluid, eat fruits and veggies, and being active reduces the risk of constipation. Advised if he needs to take a laxative, to try taking 1/2 the bottle first so it does not cause diarrhea.   Labs were reviewed. Recommend that he proceed with cycle #2 today as scheduled.   For the insomnia, discussed he can try melatonin and to avoid other triggers of insomnia such as coffee, soda, etc.   We will see him back for a follow up visit in 3 weeks for evaluation before starting cycle #3.   We discussed his tingling in his arm. He is not interested in any gabapentin for tingling. He just wanted to mention it but states while it affects his dexterity, he is able to be flexible and manage his symptoms. He received radiation to this lesion which was completed recently.   The patient was advised to call immediately if he has any concerning symptoms in the interval. The patient voices understanding of current disease status and treatment options and is in agreement with the current care plan. All questions were answered. The patient knows to call the clinic with any problems, questions or concerns. We can certainly see the patient much sooner if necessary    No orders of the defined types were placed in this encounter.    I spent 20-29 minutes in this encounter  Dawnya Grams L Champion Corales, PA-C 11/27/20

## 2020-11-24 ENCOUNTER — Ambulatory Visit
Admission: RE | Admit: 2020-11-24 | Discharge: 2020-11-24 | Disposition: A | Discharge status: Home / Self Care | Payer: BC Managed Care – PPO | Source: Ambulatory Visit | Attending: Radiation Oncology | Admitting: Radiation Oncology

## 2020-11-24 ENCOUNTER — Other Ambulatory Visit: Payer: Self-pay

## 2020-11-24 VITALS — BP 119/85 | HR 75 | Temp 97.9°F | Resp 18 | Ht 68.5 in | Wt 140.2 lb

## 2020-11-24 DIAGNOSIS — Z923 Personal history of irradiation: Secondary | ICD-10-CM | POA: Insufficient documentation

## 2020-11-24 DIAGNOSIS — C7951 Secondary malignant neoplasm of bone: Secondary | ICD-10-CM | POA: Insufficient documentation

## 2020-11-24 DIAGNOSIS — Z79899 Other long term (current) drug therapy: Secondary | ICD-10-CM | POA: Insufficient documentation

## 2020-11-24 DIAGNOSIS — Z7982 Long term (current) use of aspirin: Secondary | ICD-10-CM | POA: Diagnosis not present

## 2020-11-24 DIAGNOSIS — C3411 Malignant neoplasm of upper lobe, right bronchus or lung: Secondary | ICD-10-CM | POA: Diagnosis not present

## 2020-11-24 DIAGNOSIS — C771 Secondary and unspecified malignant neoplasm of intrathoracic lymph nodes: Secondary | ICD-10-CM | POA: Diagnosis not present

## 2020-11-24 NOTE — Progress Notes (Signed)
Mr. Sean Rose presents today for follow-up after completing radiation to his left scapula on 10/24/2020  Pain: Reports it's different; reports a new pain to his left humerus. States it's most noticeable when he externally rotates his arm (like to pull his covers across his body when sleeping). States pain has decreased from 8 out of 10 to a 2 out of 10 Skin: Reports minor itching/dryness to treatment field. States skin is intact but still slightly scaly ROM: Reports shoulder range of motion has improved. Still has some discomfort to his upper arm with some movement, but he feels his strength and mobility have both improved significantly  MedOnc F/U: Saw Dr. [name] on 11/14/2020 and will have another F/U with Casie Heilingoetter-PA on 11/27/2020 before his next cycle of chemotherapy/immunotherapy Other issues of note: Reports increase in tingling to his left arm (from shoulder down to fingers). States he has to flex his hand/fingers multiple times to get sensation to resolve. Also reports a noticeable change in his urinary habits (particualrly at night). Reports urinary hesitancy and frequency, and states he doesn't feel he can fully empty his bladder. States he is not sleeping well because he has to get up so frequently during the night to void.  Wt Readings from Last 3 Encounters:  11/24/20 140 lb 3.2 oz (63.6 kg)  11/14/20 136 lb 9.6 oz (62 kg)  10/30/20 137 lb (62.1 kg)   Vitals:   11/24/20 1441  BP: 119/85  Pulse: 75  Resp: 18  Temp: 97.9 F (36.6 C)  SpO2: 100%

## 2020-11-27 ENCOUNTER — Inpatient Hospital Stay: Payer: BC Managed Care – PPO

## 2020-11-27 ENCOUNTER — Other Ambulatory Visit: Payer: BC Managed Care – PPO

## 2020-11-27 ENCOUNTER — Inpatient Hospital Stay (HOSPITAL_BASED_OUTPATIENT_CLINIC_OR_DEPARTMENT_OTHER): Payer: BC Managed Care – PPO | Admitting: Physician Assistant

## 2020-11-27 ENCOUNTER — Inpatient Hospital Stay: Payer: BC Managed Care – PPO | Attending: Internal Medicine

## 2020-11-27 ENCOUNTER — Other Ambulatory Visit: Payer: Self-pay

## 2020-11-27 ENCOUNTER — Encounter: Payer: Self-pay | Admitting: Radiation Oncology

## 2020-11-27 VITALS — BP 130/87 | HR 73 | Temp 97.3°F | Resp 19 | Ht 68.5 in | Wt 139.2 lb

## 2020-11-27 DIAGNOSIS — Z5111 Encounter for antineoplastic chemotherapy: Secondary | ICD-10-CM

## 2020-11-27 DIAGNOSIS — C3491 Malignant neoplasm of unspecified part of right bronchus or lung: Secondary | ICD-10-CM

## 2020-11-27 DIAGNOSIS — C3411 Malignant neoplasm of upper lobe, right bronchus or lung: Secondary | ICD-10-CM | POA: Diagnosis not present

## 2020-11-27 DIAGNOSIS — Z5112 Encounter for antineoplastic immunotherapy: Secondary | ICD-10-CM

## 2020-11-27 DIAGNOSIS — Z79899 Other long term (current) drug therapy: Secondary | ICD-10-CM | POA: Diagnosis not present

## 2020-11-27 DIAGNOSIS — Z95828 Presence of other vascular implants and grafts: Secondary | ICD-10-CM

## 2020-11-27 LAB — CMP (CANCER CENTER ONLY)
ALT: 21 U/L (ref 0–44)
AST: 27 U/L (ref 15–41)
Albumin: 3.5 g/dL (ref 3.5–5.0)
Alkaline Phosphatase: 83 U/L (ref 38–126)
Anion gap: 9 (ref 5–15)
BUN: 8 mg/dL (ref 8–23)
CO2: 25 mmol/L (ref 22–32)
Calcium: 9 mg/dL (ref 8.9–10.3)
Chloride: 96 mmol/L — ABNORMAL LOW (ref 98–111)
Creatinine: 0.75 mg/dL (ref 0.61–1.24)
GFR, Estimated: >60 mL/min (ref >60)
Glucose, Bld: 131 mg/dL — ABNORMAL HIGH (ref 70–99)
Potassium: 4 mmol/L (ref 3.5–5.1)
Sodium: 130 mmol/L — ABNORMAL LOW (ref 135–145)
Total Bilirubin: 0.3 mg/dL (ref 0.3–1.2)
Total Protein: 7 g/dL (ref 6.5–8.1)

## 2020-11-27 LAB — CBC WITH DIFFERENTIAL (CANCER CENTER ONLY)
Abs Immature Granulocytes: 0.02 10*3/uL (ref 0.00–0.07)
Basophils Absolute: 0 10*3/uL (ref 0.0–0.1)
Basophils Relative: 0 %
Eosinophils Absolute: 0.1 10*3/uL (ref 0.0–0.5)
Eosinophils Relative: 2 %
HCT: 33 % — ABNORMAL LOW (ref 39.0–52.0)
Hemoglobin: 11.8 g/dL — ABNORMAL LOW (ref 13.0–17.0)
Immature Granulocytes: 0 %
Lymphocytes Relative: 31 %
Lymphs Abs: 1.8 10*3/uL (ref 0.7–4.0)
MCH: 35.9 pg — ABNORMAL HIGH (ref 26.0–34.0)
MCHC: 35.8 g/dL (ref 30.0–36.0)
MCV: 100.3 fL — ABNORMAL HIGH (ref 80.0–100.0)
Monocytes Absolute: 1.1 10*3/uL — ABNORMAL HIGH (ref 0.1–1.0)
Monocytes Relative: 18 %
Neutro Abs: 2.8 10*3/uL (ref 1.7–7.7)
Neutrophils Relative %: 49 %
Platelet Count: 500 10*3/uL — ABNORMAL HIGH (ref 150–400)
RBC: 3.29 MIL/uL — ABNORMAL LOW (ref 4.22–5.81)
RDW: 13.9 % (ref 11.5–15.5)
WBC Count: 5.9 10*3/uL (ref 4.0–10.5)
nRBC: 0 % (ref 0.0–0.2)

## 2020-11-27 LAB — TSH: TSH: 1.29 u[IU]/mL (ref 0.350–4.500)

## 2020-11-27 MED ORDER — SODIUM CHLORIDE 0.9 % IV SOLN
200.0000 mg | Freq: Once | INTRAVENOUS | Status: AC
  Administered 2020-11-27: 200 mg via INTRAVENOUS
  Filled 2020-11-27: qty 8

## 2020-11-27 MED ORDER — SODIUM CHLORIDE 0.9% FLUSH
10.0000 mL | Freq: Once | INTRAVENOUS | Status: AC
  Administered 2020-11-27: 10 mL
  Filled 2020-11-27: qty 10

## 2020-11-27 MED ORDER — SODIUM CHLORIDE 0.9% FLUSH
10.0000 mL | INTRAVENOUS | Status: DC | PRN
  Administered 2020-11-27: 10 mL
  Filled 2020-11-27: qty 10

## 2020-11-27 MED ORDER — SODIUM CHLORIDE 0.9 % IV SOLN
Freq: Once | INTRAVENOUS | Status: AC
  Filled 2020-11-27: qty 250

## 2020-11-27 MED ORDER — DEXAMETHASONE SODIUM PHOSPHATE 100 MG/10ML IJ SOLN
10.0000 mg | Freq: Once | INTRAMUSCULAR | Status: AC
  Administered 2020-11-27: 10 mg via INTRAVENOUS
  Filled 2020-11-27: qty 10

## 2020-11-27 MED ORDER — SODIUM CHLORIDE 0.9 % IV SOLN
534.5000 mg | Freq: Once | INTRAVENOUS | Status: AC
  Administered 2020-11-27: 530 mg via INTRAVENOUS
  Filled 2020-11-27: qty 53

## 2020-11-27 MED ORDER — FOSAPREPITANT DIMEGLUMINE INJECTION 150 MG
150.0000 mg | Freq: Once | INTRAVENOUS | Status: AC
  Administered 2020-11-27: 150 mg via INTRAVENOUS
  Filled 2020-11-27: qty 150

## 2020-11-27 MED ORDER — PALONOSETRON HCL INJECTION 0.25 MG/5ML
0.2500 mg | Freq: Once | INTRAVENOUS | Status: AC
  Administered 2020-11-27: 0.25 mg via INTRAVENOUS

## 2020-11-27 MED ORDER — SODIUM CHLORIDE 0.9 % IV SOLN
500.0000 mg/m2 | Freq: Once | INTRAVENOUS | Status: AC
  Administered 2020-11-27: 900 mg via INTRAVENOUS
  Filled 2020-11-27: qty 20

## 2020-11-27 MED ORDER — HEPARIN SOD (PORK) LOCK FLUSH 100 UNIT/ML IV SOLN
500.0000 [IU] | Freq: Once | INTRAVENOUS | Status: AC | PRN
  Administered 2020-11-27: 500 [IU]
  Filled 2020-11-27: qty 5

## 2020-11-27 MED ORDER — PALONOSETRON HCL INJECTION 0.25 MG/5ML
INTRAVENOUS | Status: AC
  Filled 2020-11-27: qty 5

## 2020-11-27 NOTE — Patient Instructions (Signed)
[facility] MEDICAL ONCOLOGY  Discharge Instructions: Thank you for choosing [facility] to provide your oncology and hematology care.   If you have a lab appointment with the Cancer Center, please go directly to the Cancer Center and check in at the registration area.   Wear comfortable clothing and clothing appropriate for easy access to any Portacath or PICC line.   We strive to give you quality time with your provider. You may need to reschedule your appointment if you arrive late (15 or more minutes).  Arriving late affects you and other patients whose appointments are after yours.  Also, if you miss three or more appointments without notifying the office, you may be dismissed from the clinic at the provider's discretion.      For prescription refill requests, have your pharmacy contact our office and allow 72 hours for refills to be completed.    Today you received the following chemotherapy and/or immunotherapy agents pembrolizumab, pemetrexed, carboplatin      To help prevent nausea and vomiting after your treatment, we encourage you to take your nausea medication as directed.  BELOW ARE SYMPTOMS THAT SHOULD BE REPORTED IMMEDIATELY: *FEVER GREATER THAN 100.4 F (38 C) OR HIGHER *CHILLS OR SWEATING *NAUSEA AND VOMITING THAT IS NOT CONTROLLED WITH YOUR NAUSEA MEDICATION *UNUSUAL SHORTNESS OF BREATH *UNUSUAL BRUISING OR BLEEDING *URINARY PROBLEMS (pain or burning when urinating, or frequent urination) *BOWEL PROBLEMS (unusual diarrhea, constipation, pain near the anus) TENDERNESS IN MOUTH AND THROAT WITH OR WITHOUT PRESENCE OF ULCERS (sore throat, sores in mouth, or a toothache) UNUSUAL RASH, SWELLING OR PAIN  UNUSUAL VAGINAL DISCHARGE OR ITCHING   Items with * indicate a potential emergency and should be followed up as soon as possible or go to the Emergency Department if any problems should occur.  Please show the CHEMOTHERAPY ALERT CARD or  IMMUNOTHERAPY ALERT CARD at check-in to the Emergency Department and triage nurse.  Should you have questions after your visit or need to cancel or reschedule your appointment, please contact [facility] MEDICAL ONCOLOG[facility]  Dept: [phone]  and follow the prompts.  Office hours are 8:00 a.m. to 4:30 p.m. Monday - Friday. Please note that voicemails left after 4:00 p.m. may not be returned until the following business day.  We are closed weekends and major holidays. You have access to a nurse at all times for urgent questions. Please call the main number to the clinic Dept: [phone] and follow the prompts.   For any non-urgent questions, you may also contact your provider using MyChart. We now offer e-Visits for anyone 18 and older to request care online for non-urgent symptoms. For details visit [email or web address].   Also download the MyChart app! Go to the app store, search "MyChart", open the app, select [facility], and log in with your MyChart username and password.  Due to Covid, a mask is required upon entering the hospital/clinic. If you do not have a mask, one will be given to you upon arrival. For doctor visits, patients may have 1 support person aged 18 or older with them. For treatment visits, patients cannot have anyone with them due to current Covid guidelines and our immunocompromised population.   

## 2020-11-27 NOTE — Patient Instructions (Signed)
Implanted Port Insertion, Care After This sheet gives you information about how to care for yourself after your procedure. Your health care provider may also give you more specific instructions. If you have problems or questions, contact your health care provider. What can I expect after the procedure? After the procedure, it is common to have:  Discomfort at the port insertion site.  Bruising on the skin over the port. This should improve over 3-4 days. Follow these instructions at home: Port care  After your port is placed, you will get a manufacturer's information card. The card has information about your port. Keep this card with you at all times.  Take care of the port as told by your health care provider. Ask your health care provider if you or a family member can get training for taking care of the port at home. A home health care nurse may also take care of the port.  Make sure to remember what type of port you have. Incision care  Follow instructions from your health care provider about how to take care of your port insertion site. Make sure you: ? Wash your hands with soap and water before and after you change your bandage (dressing). If soap and water are not available, use hand sanitizer. ? Change your dressing as told by your health care provider. ? Leave stitches (sutures), skin glue, or adhesive strips in place. These skin closures may need to stay in place for 2 weeks or longer. If adhesive strip edges start to loosen and curl up, you may trim the loose edges. Do not remove adhesive strips completely unless your health care provider tells you to do that.  Check your port insertion site every day for signs of infection. Check for: ? Redness, swelling, or pain. ? Fluid or blood. ? Warmth. ? Pus or a bad smell.      Activity  Return to your normal activities as told by your health care provider. Ask your health care provider what activities are safe for you.  Do not  lift anything that is heavier than 10 lb (4.5 kg), or the limit that you are told, until your health care provider says that it is safe. General instructions  Take over-the-counter and prescription medicines only as told by your health care provider.  Do not take baths, swim, or use a hot tub until your health care provider approves. Ask your health care provider if you may take showers. You may only be allowed to take sponge baths.  Do not drive for 24 hours if you were given a sedative during your procedure.  Wear a medical alert bracelet in case of an emergency. This will tell any health care providers that you have a port.  Keep all follow-up visits as told by your health care provider. This is important. Contact a health care provider if:  You cannot flush your port with saline as directed, or you cannot draw blood from the port.  You have a fever or chills.  You have redness, swelling, or pain around your port insertion site.  You have fluid or blood coming from your port insertion site.  Your port insertion site feels warm to the touch.  You have pus or a bad smell coming from the port insertion site. Get help right away if:  You have chest pain or shortness of breath.  You have bleeding from your port that you cannot control. Summary  Take care of the port as told by your   health care provider. Keep the manufacturer's information card with you at all times.  Change your dressing as told by your health care provider.  Contact a health care provider if you have a fever or chills or if you have redness, swelling, or pain around your port insertion site.  Keep all follow-up visits as told by your health care provider. This information is not intended to replace advice given to you by your health care provider. Make sure you discuss any questions you have with your health care provider. Document Revised: 02/10/2018 Document Reviewed: 02/10/2018 Elsevier Patient Education   2021 Elsevier Inc.  

## 2020-11-27 NOTE — Progress Notes (Signed)
Radiation Oncology         [phone] ________________________________  Name: Sean Rose MRN: [id number]  Date: 11/24/2020  DOB: [birth year]  Follow-Up Visit Note  Out patient  CC: Sean Peng, NP  Sean Bears, MD  Diagnosis and Prior Radiotherapy:    ICD-10-CM   1. Bone metastasis ([place])  C79.51     CHIEF COMPLAINT: Here for follow-up and surveillance of metastatic cancer to left scapula   Narrative:  The patient returns today for routine follow-up.   [name] presents today for follow-up after completing radiation to his left scapula on 10/24/2020  Pain: The pain in the left scapula has improved significantly.  He reports that prior to starting radiation therapy he did have some pain/tingling in his left humerus which has become more noticeable.  It is tolerable and not nearly as severe as the scapular pain had been  He is now able to raise his left arm over his head  Saw Dr. Curt Rose on 11/14/2020 and will have another F/U with Sean Rose-PA on 11/27/2020 before his next cycle of chemotherapy/immunotherapy                                 ALLERGIES:  is allergic to compazine [prochlorperazine].  Meds: Current Outpatient Medications  Medication Sig Dispense Refill  . amLODipine (NORVASC) 10 MG tablet TAKE 1 TABLET(10 MG) BY MOUTH DAILY (Patient taking differently: Take 10 mg by mouth daily.) 90 tablet 3  . aspirin EC 81 MG tablet Take 81 mg by mouth daily.    . clopidogrel (PLAVIX) 75 MG tablet TAKE 1 TABLET BY MOUTH EVERY DAY (Patient taking differently: Take 75 mg by mouth daily.) 90 tablet 3  . diphenhydrAMINE (BENADRYL) 25 mg capsule Take 25 mg by mouth daily as needed for allergies.     Marland Kitchen esomeprazole (NEXIUM) 20 MG capsule Take 40 mg by mouth at bedtime.    . folic acid (FOLVITE) 1 MG tablet Take 1 tablet (1 mg total) by mouth daily. 30 tablet 4  . ibuprofen (ADVIL,MOTRIN) 200 MG tablet Take 400 mg by mouth every 6 (six) hours as needed  for headache or moderate pain.     Marland Kitchen lidocaine-prilocaine (EMLA) cream Apply to the Port-A-Cath site 30-60 minutes before treatment. 30 g 0  . Magnesium 250 MG TABS Take 250 mg by mouth daily.    . metoprolol succinate (TOPROL-XL) 100 MG 24 hr tablet TAKE 1 TABLET BY MOUTH EVERY DAY 90 tablet 3  . Multiple Vitamin (MULTIVITAMIN WITH MINERALS) TABS tablet Take 1 tablet by mouth daily.    . nitroGLYCERIN (NITROSTAT) 0.4 MG SL tablet PLACE 1 TABLET UNDER THE TONGUE EVERY 5 MINUTES AS NEEDED FOR CHEST PAIN. (Patient not taking: Reported on 11/14/2020) 25 tablet 6  . ondansetron (ZOFRAN) 8 MG tablet Take 1 tablet (8 mg total) by mouth every 8 (eight) hours as needed for nausea or vomiting. 20 tablet 0  . rosuvastatin (CRESTOR) 5 MG tablet Take 1 tablet (5 mg total) by mouth every other day. 45 tablet 3  . umeclidinium-vilanterol (ANORO ELLIPTA) 62.5-25 MCG/INH AEPB Inhale 1 puff into the lungs daily. 60 each 5   No current facility-administered medications for this encounter.   Facility-Administered Medications Ordered in Other Encounters  Medication Dose Route Frequency Provider Last Rate Last Admin  . heparin lock flush 100 unit/mL  500 Units Intracatheter Once PRN Sean Bears, MD      .  sodium chloride flush (NS) 0.9 % injection 10 mL  10 mL Intracatheter PRN Sean Bears, MD        Physical Findings: The patient is in no acute distress. Patient is alert and oriented.  height is 5' 8.5" (1.74 m) and weight is 140 lb 3.2 oz (63.6 kg). His temperature is 97.9 F (36.6 C). His blood pressure is 119/85 and his pulse is 75. His respiration is 18 and oxygen saturation is 100%. .    No residual skin irritation in the radiation fields.  The range of motion in his left shoulder has improved significantly and is now able to raise his arm over his head  Lab Findings: Lab Results  Component Value Date   WBC 5.9 11/27/2020   HGB 11.8 (L) 11/27/2020   HCT 33.0 (L) 11/27/2020   MCV 100.3 (H)  11/27/2020   PLT 500 (H) 11/27/2020    Radiographic Findings: IR IMAGING GUIDED PORT INSERTION  Result Date: 11/13/2020 CLINICAL DATA:  Metastatic lung carcinoma, needs durable venous access for planned treatment regimen. EXAM: TUNNELED PORT CATHETER PLACEMENT WITH ULTRASOUND AND FLUOROSCOPIC GUIDANCE FLUOROSCOPY TIME:  12 seconds; 1 mGy ANESTHESIA/SEDATION: Intravenous Fentanyl 14mcg and Versed 3mg  were administered as conscious sedation during continuous monitoring of the patient's level of consciousness and physiological / cardiorespiratory status by the radiology RN, with a total moderate sedation time of 14 minutes. TECHNIQUE: The procedure, risks, benefits, and alternatives were explained to the patient. Questions regarding the procedure were encouraged and answered. The patient understands and consents to the procedure. Patency of the right IJ vein was confirmed with ultrasound with image documentation. An appropriate skin site was determined. Skin site was marked. Region was prepped using maximum barrier technique including cap and mask, sterile gown, sterile gloves, large sterile sheet, and Chlorhexidine as cutaneous antisepsis. The region was infiltrated locally with 1% lidocaine. Under real-time ultrasound guidance, the right IJ vein was accessed with a 21 gauge micropuncture needle; the needle tip within the vein was confirmed with ultrasound image documentation. Needle was exchanged over a 018 guidewire for transitional dilator, and vascular measurement was performed. A small incision was made on the right anterior chest wall and a subcutaneous pocket fashioned. The power-injectable low-profile port was positioned and its catheter tunneled to the right IJ dermatotomy site. The transitional dilator was exchanged over an Amplatz wire for a peel-away sheath, through which the port catheter, which had been trimmed to the appropriate length, was advanced and positioned under fluoroscopy with its tip  at the cavoatrial junction. Spot chest radiograph confirms good catheter position and no pneumothorax. The port was flushed per protocol. The pocket was closed with deep interrupted and subcuticular continuous 3-0 Monocryl sutures. The incisions were covered with Dermabond then covered with a sterile dressing. The patient tolerated the procedure well. COMPLICATIONS: COMPLICATIONS None immediate IMPRESSION: Technically successful right IJ power-injectable port catheter placement. Ready for routine use. Electronically Signed   By: Lucrezia Europe M.D.   On: 11/13/2020 16:56    Impression/Plan: He has had a very good palliative response to radiation therapy.  He is very pleased with his result and he has healed well from treatment in our department.  He will continue with systemic therapy under the care of Dr. [name].  I will see him back on an as-needed basis.  He did not have any metastatic lesions in the left humerus on his pretreatment PET scan.  I recommended that when he undergo rescan in of his chest with CT  or PET that he ask the radiology tech to note that he is having some left humeral pain and to request keeping his arm by his side so that it can be imaged along with his chest.  It is possible that he has some nerve irritation from the scapular lesion, and this is radiating down his arm.  On date of service, in total, I spent 15 minutes on this encounter. Patient was seen in person.  _____________________________________   Eppie Gibson, MD

## 2020-12-04 ENCOUNTER — Other Ambulatory Visit: Payer: Self-pay

## 2020-12-04 ENCOUNTER — Other Ambulatory Visit: Payer: BC Managed Care – PPO

## 2020-12-04 ENCOUNTER — Inpatient Hospital Stay: Payer: BC Managed Care – PPO

## 2020-12-04 DIAGNOSIS — Z95828 Presence of other vascular implants and grafts: Secondary | ICD-10-CM

## 2020-12-04 DIAGNOSIS — Z5111 Encounter for antineoplastic chemotherapy: Secondary | ICD-10-CM | POA: Diagnosis not present

## 2020-12-04 DIAGNOSIS — C3491 Malignant neoplasm of unspecified part of right bronchus or lung: Secondary | ICD-10-CM

## 2020-12-04 DIAGNOSIS — C3411 Malignant neoplasm of upper lobe, right bronchus or lung: Secondary | ICD-10-CM | POA: Diagnosis not present

## 2020-12-04 DIAGNOSIS — Z79899 Other long term (current) drug therapy: Secondary | ICD-10-CM | POA: Diagnosis not present

## 2020-12-04 DIAGNOSIS — Z5112 Encounter for antineoplastic immunotherapy: Secondary | ICD-10-CM | POA: Diagnosis not present

## 2020-12-04 LAB — CMP (CANCER CENTER ONLY)
ALT: 34 U/L (ref 0–44)
AST: 37 U/L (ref 15–41)
Albumin: 3.9 g/dL (ref 3.5–5.0)
Alkaline Phosphatase: 85 U/L (ref 38–126)
Anion gap: 14 (ref 5–15)
BUN: 10 mg/dL (ref 8–23)
CO2: 25 mmol/L (ref 22–32)
Calcium: 9.7 mg/dL (ref 8.9–10.3)
Chloride: 91 mmol/L — ABNORMAL LOW (ref 98–111)
Creatinine: 0.77 mg/dL (ref 0.61–1.24)
GFR, Estimated: >60 mL/min (ref >60)
Glucose, Bld: 117 mg/dL — ABNORMAL HIGH (ref 70–99)
Potassium: 3.7 mmol/L (ref 3.5–5.1)
Sodium: 130 mmol/L — ABNORMAL LOW (ref 135–145)
Total Bilirubin: 0.7 mg/dL (ref 0.3–1.2)
Total Protein: 7.4 g/dL (ref 6.5–8.1)

## 2020-12-04 LAB — CBC WITH DIFFERENTIAL (CANCER CENTER ONLY)
Abs Immature Granulocytes: 0.03 10*3/uL (ref 0.00–0.07)
Basophils Absolute: 0 10*3/uL (ref 0.0–0.1)
Basophils Relative: 0 %
Eosinophils Absolute: 0 10*3/uL (ref 0.0–0.5)
Eosinophils Relative: 1 %
HCT: 32.3 % — ABNORMAL LOW (ref 39.0–52.0)
Hemoglobin: 11.5 g/dL — ABNORMAL LOW (ref 13.0–17.0)
Immature Granulocytes: 1 %
Lymphocytes Relative: 36 %
Lymphs Abs: 1.4 10*3/uL (ref 0.7–4.0)
MCH: 35.6 pg — ABNORMAL HIGH (ref 26.0–34.0)
MCHC: 35.6 g/dL (ref 30.0–36.0)
MCV: 100 fL (ref 80.0–100.0)
Monocytes Absolute: 0.4 10*3/uL (ref 0.1–1.0)
Monocytes Relative: 10 %
Neutro Abs: 2.1 10*3/uL (ref 1.7–7.7)
Neutrophils Relative %: 52 %
Platelet Count: 270 10*3/uL (ref 150–400)
RBC: 3.23 MIL/uL — ABNORMAL LOW (ref 4.22–5.81)
RDW: 14 % (ref 11.5–15.5)
WBC Count: 4 10*3/uL (ref 4.0–10.5)
nRBC: 0 % (ref 0.0–0.2)

## 2020-12-04 MED ORDER — SODIUM CHLORIDE 0.9% FLUSH
10.0000 mL | Freq: Once | INTRAVENOUS | Status: AC
Start: 2020-12-04 — End: 2020-12-04
  Administered 2020-12-04: 10 mL
  Filled 2020-12-04: qty 10

## 2020-12-04 MED ORDER — HEPARIN SOD (PORK) LOCK FLUSH 100 UNIT/ML IV SOLN
500.0000 [IU] | Freq: Once | INTRAVENOUS | Status: AC
  Administered 2020-12-04: 500 [IU]
  Filled 2020-12-04: qty 5

## 2020-12-11 ENCOUNTER — Other Ambulatory Visit: Payer: Self-pay

## 2020-12-11 ENCOUNTER — Inpatient Hospital Stay: Payer: BC Managed Care – PPO

## 2020-12-11 ENCOUNTER — Other Ambulatory Visit: Payer: BC Managed Care – PPO

## 2020-12-11 DIAGNOSIS — C3411 Malignant neoplasm of upper lobe, right bronchus or lung: Secondary | ICD-10-CM | POA: Diagnosis not present

## 2020-12-11 DIAGNOSIS — Z5111 Encounter for antineoplastic chemotherapy: Secondary | ICD-10-CM | POA: Diagnosis not present

## 2020-12-11 DIAGNOSIS — Z5112 Encounter for antineoplastic immunotherapy: Secondary | ICD-10-CM | POA: Diagnosis not present

## 2020-12-11 DIAGNOSIS — Z95828 Presence of other vascular implants and grafts: Secondary | ICD-10-CM

## 2020-12-11 DIAGNOSIS — C3491 Malignant neoplasm of unspecified part of right bronchus or lung: Secondary | ICD-10-CM

## 2020-12-11 DIAGNOSIS — Z79899 Other long term (current) drug therapy: Secondary | ICD-10-CM | POA: Diagnosis not present

## 2020-12-11 LAB — CBC WITH DIFFERENTIAL (CANCER CENTER ONLY)
Abs Immature Granulocytes: 0.01 10*3/uL (ref 0.00–0.07)
Basophils Absolute: 0 10*3/uL (ref 0.0–0.1)
Basophils Relative: 0 %
Eosinophils Absolute: 0.1 10*3/uL (ref 0.0–0.5)
Eosinophils Relative: 1 %
HCT: 28.4 % — ABNORMAL LOW (ref 39.0–52.0)
Hemoglobin: 10.2 g/dL — ABNORMAL LOW (ref 13.0–17.0)
Immature Granulocytes: 0 %
Lymphocytes Relative: 32 %
Lymphs Abs: 1.9 10*3/uL (ref 0.7–4.0)
MCH: 36.2 pg — ABNORMAL HIGH (ref 26.0–34.0)
MCHC: 35.9 g/dL (ref 30.0–36.0)
MCV: 100.7 fL — ABNORMAL HIGH (ref 80.0–100.0)
Monocytes Absolute: 1 10*3/uL (ref 0.1–1.0)
Monocytes Relative: 16 %
Neutro Abs: 3 10*3/uL (ref 1.7–7.7)
Neutrophils Relative %: 51 %
Platelet Count: 171 10*3/uL (ref 150–400)
RBC: 2.82 MIL/uL — ABNORMAL LOW (ref 4.22–5.81)
RDW: 14.3 % (ref 11.5–15.5)
WBC Count: 6 10*3/uL (ref 4.0–10.5)
nRBC: 0 % (ref 0.0–0.2)

## 2020-12-11 LAB — CMP (CANCER CENTER ONLY)
ALT: 21 U/L (ref 0–44)
AST: 29 U/L (ref 15–41)
Albumin: 3.8 g/dL (ref 3.5–5.0)
Alkaline Phosphatase: 78 U/L (ref 38–126)
Anion gap: 7 (ref 5–15)
BUN: 9 mg/dL (ref 8–23)
CO2: 27 mmol/L (ref 22–32)
Calcium: 9.2 mg/dL (ref 8.9–10.3)
Chloride: 95 mmol/L — ABNORMAL LOW (ref 98–111)
Creatinine: 0.74 mg/dL (ref 0.61–1.24)
GFR, Estimated: >60 mL/min (ref >60)
Glucose, Bld: 95 mg/dL (ref 70–99)
Potassium: 3.8 mmol/L (ref 3.5–5.1)
Sodium: 129 mmol/L — ABNORMAL LOW (ref 135–145)
Total Bilirubin: 0.4 mg/dL (ref 0.3–1.2)
Total Protein: 7 g/dL (ref 6.5–8.1)

## 2020-12-11 MED ORDER — SODIUM CHLORIDE 0.9% FLUSH
10.0000 mL | Freq: Once | INTRAVENOUS | Status: AC
Start: 2020-12-11 — End: 2020-12-11
  Administered 2020-12-11: 10 mL
  Filled 2020-12-11: qty 10

## 2020-12-11 MED ORDER — HEPARIN SOD (PORK) LOCK FLUSH 100 UNIT/ML IV SOLN
250.0000 [IU] | Freq: Once | INTRAVENOUS | Status: AC
  Administered 2020-12-11: 250 [IU]
  Filled 2020-12-11: qty 5

## 2020-12-15 ENCOUNTER — Other Ambulatory Visit: Payer: BC Managed Care – PPO

## 2020-12-18 ENCOUNTER — Other Ambulatory Visit: Payer: BC Managed Care – PPO

## 2020-12-18 ENCOUNTER — Inpatient Hospital Stay (HOSPITAL_BASED_OUTPATIENT_CLINIC_OR_DEPARTMENT_OTHER): Payer: BC Managed Care – PPO | Admitting: Internal Medicine

## 2020-12-18 ENCOUNTER — Inpatient Hospital Stay: Payer: BC Managed Care – PPO

## 2020-12-18 ENCOUNTER — Other Ambulatory Visit: Payer: Self-pay

## 2020-12-18 ENCOUNTER — Encounter: Payer: Self-pay | Admitting: Internal Medicine

## 2020-12-18 DIAGNOSIS — C3491 Malignant neoplasm of unspecified part of right bronchus or lung: Secondary | ICD-10-CM

## 2020-12-18 DIAGNOSIS — C3411 Malignant neoplasm of upper lobe, right bronchus or lung: Secondary | ICD-10-CM | POA: Diagnosis not present

## 2020-12-18 DIAGNOSIS — C3490 Malignant neoplasm of unspecified part of unspecified bronchus or lung: Secondary | ICD-10-CM

## 2020-12-18 DIAGNOSIS — Z95828 Presence of other vascular implants and grafts: Secondary | ICD-10-CM

## 2020-12-18 DIAGNOSIS — Z5111 Encounter for antineoplastic chemotherapy: Secondary | ICD-10-CM | POA: Diagnosis not present

## 2020-12-18 DIAGNOSIS — Z5112 Encounter for antineoplastic immunotherapy: Secondary | ICD-10-CM | POA: Diagnosis not present

## 2020-12-18 DIAGNOSIS — Z79899 Other long term (current) drug therapy: Secondary | ICD-10-CM | POA: Diagnosis not present

## 2020-12-18 LAB — CMP (CANCER CENTER ONLY)
ALT: 13 U/L (ref 0–44)
AST: 23 U/L (ref 15–41)
Albumin: 3.7 g/dL (ref 3.5–5.0)
Alkaline Phosphatase: 75 U/L (ref 38–126)
Anion gap: 7 (ref 5–15)
BUN: 6 mg/dL — ABNORMAL LOW (ref 8–23)
CO2: 25 mmol/L (ref 22–32)
Calcium: 9.3 mg/dL (ref 8.9–10.3)
Chloride: 98 mmol/L (ref 98–111)
Creatinine: 0.77 mg/dL (ref 0.61–1.24)
GFR, Estimated: >60 mL/min (ref >60)
Glucose, Bld: 108 mg/dL — ABNORMAL HIGH (ref 70–99)
Potassium: 4.1 mmol/L (ref 3.5–5.1)
Sodium: 130 mmol/L — ABNORMAL LOW (ref 135–145)
Total Bilirubin: 0.5 mg/dL (ref 0.3–1.2)
Total Protein: 7 g/dL (ref 6.5–8.1)

## 2020-12-18 LAB — CBC WITH DIFFERENTIAL (CANCER CENTER ONLY)
Abs Immature Granulocytes: 0.02 10*3/uL (ref 0.00–0.07)
Basophils Absolute: 0 10*3/uL (ref 0.0–0.1)
Basophils Relative: 0 %
Eosinophils Absolute: 0.1 10*3/uL (ref 0.0–0.5)
Eosinophils Relative: 2 %
HCT: 29.4 % — ABNORMAL LOW (ref 39.0–52.0)
Hemoglobin: 10.6 g/dL — ABNORMAL LOW (ref 13.0–17.0)
Immature Granulocytes: 0 %
Lymphocytes Relative: 37 %
Lymphs Abs: 1.8 10*3/uL (ref 0.7–4.0)
MCH: 36.9 pg — ABNORMAL HIGH (ref 26.0–34.0)
MCHC: 36.1 g/dL — ABNORMAL HIGH (ref 30.0–36.0)
MCV: 102.4 fL — ABNORMAL HIGH (ref 80.0–100.0)
Monocytes Absolute: 0.8 10*3/uL (ref 0.1–1.0)
Monocytes Relative: 17 %
Neutro Abs: 2.1 10*3/uL (ref 1.7–7.7)
Neutrophils Relative %: 44 %
Platelet Count: 315 10*3/uL (ref 150–400)
RBC: 2.87 MIL/uL — ABNORMAL LOW (ref 4.22–5.81)
RDW: 16.4 % — ABNORMAL HIGH (ref 11.5–15.5)
WBC Count: 4.9 10*3/uL (ref 4.0–10.5)
nRBC: 0 % (ref 0.0–0.2)

## 2020-12-18 LAB — TSH: TSH: 1.075 u[IU]/mL (ref 0.320–4.118)

## 2020-12-18 MED ORDER — HEPARIN SOD (PORK) LOCK FLUSH 100 UNIT/ML IV SOLN
500.0000 [IU] | Freq: Once | INTRAVENOUS | Status: AC | PRN
  Administered 2020-12-18: 500 [IU]
  Filled 2020-12-18: qty 5

## 2020-12-18 MED ORDER — PALONOSETRON HCL INJECTION 0.25 MG/5ML
0.2500 mg | Freq: Once | INTRAVENOUS | Status: AC
  Administered 2020-12-18: 0.25 mg via INTRAVENOUS

## 2020-12-18 MED ORDER — CYANOCOBALAMIN 1000 MCG/ML IJ SOLN
1000.0000 ug | Freq: Once | INTRAMUSCULAR | Status: AC
  Administered 2020-12-18: 1000 ug via INTRAMUSCULAR

## 2020-12-18 MED ORDER — SODIUM CHLORIDE 0.9 % IV SOLN
10.0000 mg | Freq: Once | INTRAVENOUS | Status: AC
  Administered 2020-12-18: 10 mg via INTRAVENOUS
  Filled 2020-12-18: qty 10

## 2020-12-18 MED ORDER — PALONOSETRON HCL INJECTION 0.25 MG/5ML
INTRAVENOUS | Status: AC
  Filled 2020-12-18: qty 5

## 2020-12-18 MED ORDER — SODIUM CHLORIDE 0.9 % IV SOLN
Freq: Once | INTRAVENOUS | Status: AC
  Filled 2020-12-18: qty 250

## 2020-12-18 MED ORDER — SODIUM CHLORIDE 0.9 % IV SOLN
500.0000 mg/m2 | Freq: Once | INTRAVENOUS | Status: AC
  Administered 2020-12-18: 900 mg via INTRAVENOUS
  Filled 2020-12-18: qty 16

## 2020-12-18 MED ORDER — SODIUM CHLORIDE 0.9% FLUSH
10.0000 mL | Freq: Once | INTRAVENOUS | Status: AC
Start: 2020-12-18 — End: 2020-12-18
  Administered 2020-12-18: 10 mL
  Filled 2020-12-18: qty 10

## 2020-12-18 MED ORDER — FOSAPREPITANT DIMEGLUMINE INJECTION 150 MG
150.0000 mg | Freq: Once | INTRAVENOUS | Status: AC
  Administered 2020-12-18: 150 mg via INTRAVENOUS
  Filled 2020-12-18: qty 150

## 2020-12-18 MED ORDER — CYANOCOBALAMIN 1000 MCG/ML IJ SOLN
INTRAMUSCULAR | Status: AC
  Filled 2020-12-18: qty 1

## 2020-12-18 MED ORDER — SODIUM CHLORIDE 0.9% FLUSH
10.0000 mL | INTRAVENOUS | Status: DC | PRN
  Administered 2020-12-18: 10 mL
  Filled 2020-12-18: qty 10

## 2020-12-18 MED ORDER — SODIUM CHLORIDE 0.9 % IV SOLN
200.0000 mg | Freq: Once | INTRAVENOUS | Status: AC
  Administered 2020-12-18: 200 mg via INTRAVENOUS
  Filled 2020-12-18: qty 8

## 2020-12-18 MED ORDER — SODIUM CHLORIDE 0.9 % IV SOLN
534.5000 mg | Freq: Once | INTRAVENOUS | Status: AC
  Administered 2020-12-18: 530 mg via INTRAVENOUS
  Filled 2020-12-18: qty 53

## 2020-12-18 NOTE — Progress Notes (Signed)
[facility] [phone]   [phone]) [phone]  OFFICE PROGRESS NOTE  [name], NP [place] [address]  DIAGNOSIS: Metastatic non-small cell lung cancer initially diagnosed as stage IIIA (T3, N1, M0) non-small cell lung cancer, adenocarcinoma with no actionable mutations presented with multiple pulmonary nodules in the right upper lobe as well as metastatic disease in intraparenchymal lymphadenopathy.  This was diagnosed in February 2020.  The patient has disease recurrence in March 2022. PD-L1 expression 20%. He has no actionable mutations by foundation 1.  PRIOR THERAPY: 1) Status post right upper lobectomy with lymph node dissection. 2) Adjuvant systemic chemotherapy with cisplatin 75 mg/M2 and Alimta 500 mg/M2 every 3 weeks.  Status post 4 cycles. 3) palliative radiotherapy to the metastatic bone disease and the scapula as well as the left supraclavicular area under the care of Dr. Isidore Moos  CURRENT THERAPY: Systemic chemotherapy with carboplatin for AUC of 5, Alimta 500 mg/M2 and Keytruda 200 mg IV every 3 weeks.  First dose November 06, 2020.  Status post 2 cycles  INTERVAL HISTORY: Sean Rose 65 y.o. male returns to the clinic today for follow-up visit.  The patient felt much better after the second cycle of his chemotherapy.  He denied having any significant fatigue or weakness.  He continues to have mild pain in the left arm.  He denied having any nausea, vomiting, diarrhea or constipation.  He has no headache or visual changes.  He has no weight loss or night sweats.  He is here today for evaluation before starting cycle #3.   MEDICAL HISTORY: Past Medical History:  Diagnosis Date  . Anginal pain ([place] [facility] [place])   . CAD (coronary artery disease)   . CAP (community acquired pneumonia) 09/2016  . COPD (chronic obstructive pulmonary disease) ([place])   . GERD (gastroesophageal reflux disease)   . Heart murmur    "I was told I had  one when I was a kid"  . Hemorrhoids   . History of anal fissures    "no surgeries" (10/30/2016)  . Hyperlipidemia   . Hypertension   . lung ca dx'd 08/2018  . Peripheral arterial disease ([place] [facility])    status post right common iliac artery stenting back in 2007  . Seasonal allergies   . Tobacco abuse     ALLERGIES:  is allergic to compazine [prochlorperazine].  MEDICATIONS:  Current Outpatient Medications  Medication Sig Dispense Refill  . amLODipine (NORVASC) 10 MG tablet TAKE 1 TABLET(10 MG) BY MOUTH DAILY (Patient taking differently: Take 10 mg by mouth daily.) 90 tablet 3  . aspirin EC 81 MG tablet Take 81 mg by mouth daily.    . clopidogrel (PLAVIX) 75 MG tablet TAKE 1 TABLET BY MOUTH EVERY DAY (Patient taking differently: Take 75 mg by mouth daily.) 90 tablet 3  . diphenhydrAMINE (BENADRYL) 25 mg capsule Take 25 mg by mouth daily as needed for allergies.     Marland Kitchen esomeprazole (NEXIUM) 20 MG capsule Take 40 mg by mouth at bedtime.    . folic acid (FOLVITE) 1 MG tablet Take 1 tablet (1 mg total) by mouth daily. 30 tablet 4  . ibuprofen (ADVIL,MOTRIN) 200 MG tablet Take 400 mg by mouth every 6 (six) hours as needed for headache or moderate pain.     Marland Kitchen lidocaine-prilocaine (EMLA) cream Apply to the Port-A-Cath site 30-60 minutes before treatment. 30 g 0  . Magnesium 250 MG TABS Take 250 mg by mouth daily.    Marland Kitchen  metoprolol succinate (TOPROL-XL) 100 MG 24 hr tablet TAKE 1 TABLET BY MOUTH EVERY DAY 90 tablet 3  . Multiple Vitamin (MULTIVITAMIN WITH MINERALS) TABS tablet Take 1 tablet by mouth daily.    . nitroGLYCERIN (NITROSTAT) 0.4 MG SL tablet PLACE 1 TABLET UNDER THE TONGUE EVERY 5 MINUTES AS NEEDED FOR CHEST PAIN. (Patient not taking: Reported on 11/14/2020) 25 tablet 6  . ondansetron (ZOFRAN) 8 MG tablet Take 1 tablet (8 mg total) by mouth every 8 (eight) hours as needed for nausea or vomiting. 20 tablet 0  . rosuvastatin (CRESTOR) 5 MG tablet Take 1 tablet (5 mg total) by mouth every other  day. 45 tablet 3  . umeclidinium-vilanterol (ANORO ELLIPTA) 62.5-25 MCG/INH AEPB Inhale 1 puff into the lungs daily. 60 each 5   No current facility-administered medications for this visit.    SURGICAL HISTORY:  Past Surgical History:  Procedure Laterality Date  . ANKLE SURGERY Left    "rebuilt it"  . ANTERIOR CRUCIATE LIGAMENT REPAIR Right   . CARDIAC CATHETERIZATION  09/23/2008   Continued medical therapy - may need GI evaluation in addition.  Marland Kitchen CARDIAC CATHETERIZATION  10/28/2007   Medical therapy recommended.  Marland Kitchen CARDIAC CATHETERIZATION  11/18/2006   In-stent restenosis RCA  (50% distal edge, 80% segmental mid, and 50-60% segmental proximal). Successful cutting balloon atherectomy using a [id number] cutting balloon. 3 inflations with atherectomy performed on mid and proximal portions resulting in reduction of 80% mid in-stent restenosis to less than 20% residual and 50-60% segmental proximal to less than 20% residual without dissection.  Marland Kitchen CARDIAC CATHETERIZATION  02/26/2006   Severe stenosis in RCA. Stenting performed using IVUS. 3.5x20 Maverick balloon deployed at [employer]. Distal stent-a 4x28 Liberte stent-deployed 12atm 48sec, 12atm 31sec, 4atm 19sec. Mid stent-a 4x28 Liberte stent-deployed 14atm 45sec, 14atm 60sec, 14atm 44sec. Proximal stent-4x8 Liberte- 14atm 45sec,14atm 47sec, 16atm 43sec. Severely diseased segment then appeared TIMI-3 flow.  Marland Kitchen CARDIOVASCULAR STRESS TEST  11/17/2012   No significant ECG changes. Septal perfusion defect is new when complared to study from 2010. Abnormal myocardial perfusion imaging with a basal to mid perfusion suggestive of previous MI.  Marland Kitchen CAROTID DOPPLER  08/09/2011   Bilateral Bulb/Proximal ICA - demonstrated a mild amount of fibrous plaque without evidence of significant diameter reduction reduction or other vascular abnormality.  . CHEST TUBE INSERTION Right 09/02/2018   Procedure: Chest Tube Insertion;  Surgeon: Garner Nash, DO;  Location: [facility];   Service: Thoracic;  Laterality: Right;  . COLONOSCOPY     2003, 2014  . CORONARY ANGIOPLASTY WITH STENT PLACEMENT    . FEMORAL ARTERY STENT    . INGUINAL HERNIA REPAIR Right   . IR IMAGING GUIDED PORT INSERTION  11/13/2020  . KNEE ARTHROSCOPY Right "multiple"  . LAPAROSCOPIC APPENDECTOMY N/A 12/07/2018   Procedure: APPENDECTOMY LAPAROSCOPIC;  Surgeon: Coralie Keens, MD;  Location: [facility];  Service: General;  Laterality: N/A;  . LOWER EXTREMITY ARTERIAL DOPPLER  01/31/2011   Bilateral ABIs-normal values with no suggestion of arterial insuff to the lower extremities at rest. Right CIA stent-mild amount of nonhemodynamically significant plaque is noted throughout  . 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 PERCUTANEOUS STENT INTERVENTION  04/04/2006 & 04/13/2015   a. Right common iliac artery with an 8.0x18 mm Herculink stent deployed at 12 atm. Stenosis was reduced from 80% to 0% with brisk flow. b. I-cast stenting to left common iliac artery  . PERIPHERAL VASCULAR CATHETERIZATION N/A 04/13/2015   Procedure: Lower  Extremity Angiography;  Surgeon: Lorretta Harp, MD; L-oCIA 75%, 40-50% L-EIA, R-CIA stent patent, s/p 8 mm x 38 mm ICast covered stent>>0% stenosis in L-oCIA     . SHOULDER ARTHROSCOPY WITH ROTATOR CUFF REPAIR Right   . TRANSTHORACIC ECHOCARDIOGRAM  11/26/2012   EF not noted. Aortic valve-sclerosis without stenosis, no regurgiation.   Marland Kitchen UPPER GASTROINTESTINAL ENDOSCOPY    . US CAROTID DOPPLER BILATERAL ([place] [facility])  08/09/2011   Bilateral Bulb/Proximal ICAa demonstrated a mild amount of fibrous plaque without evidence of significant diameter reduction or any other vascular abnormality.  Marland Kitchen VIDEO ASSISTED THORACOSCOPY (VATS)/ LOBECTOMY Right 09/14/2018   Procedure: RIGHT VIDEO ASSISTED THORACOSCOPY (VATS)/ RIGHT UPPER LOBECTOMY;  Surgeon: Melrose Nakayama, MD;  Location: [facility];  Service: Thoracic;  Laterality: Right;  Marland Kitchen VIDEO BRONCHOSCOPY WITH ENDOBRONCHIAL NAVIGATION N/A  09/02/2018   Procedure: VIDEO BRONCHOSCOPY WITH ENDOBRONCHIAL NAVIGATION;  Surgeon: Garner Nash, DO;  Location: [facility];  Service: Thoracic;  Laterality: N/A;    REVIEW OF SYSTEMS:  A comprehensive review of systems was negative except for: Constitutional: positive for fatigue Musculoskeletal: positive for bone pain   PHYSICAL EXAMINATION: General appearance: alert, cooperative, fatigued and no distress Head: Normocephalic, without obvious abnormality, atraumatic Neck: no adenopathy, no JVD, supple, symmetrical, trachea midline and thyroid not enlarged, symmetric, no tenderness/mass/nodules Lymph nodes: Cervical, supraclavicular, and axillary nodes normal. Resp: clear to auscultation bilaterally Back: symmetric, no curvature. ROM normal. No CVA tenderness. Cardio: regular rate and rhythm, S1, S2 normal, no murmur, click, rub or gallop GI: soft, non-tender; bowel sounds normal; no masses,  no organomegaly Extremities: extremities normal, atraumatic, no cyanosis or edema  ECOG PERFORMANCE STATUS: 1 - Symptomatic but completely ambulatory  Blood pressure 129/78, pulse 64, temperature 97.7 F (36.5 C), temperature source Tympanic, resp. rate 20, height 5' 8.5" (1.74 m), weight 136 lb 4.8 oz (61.8 kg), SpO2 100 %.  LABORATORY DATA: Lab Results  Component Value Date   WBC 4.9 12/18/2020   HGB 10.6 (L) 12/18/2020   HCT 29.4 (L) 12/18/2020   MCV 102.4 (H) 12/18/2020   PLT 315 12/18/2020      Chemistry      Component Value Date/Time   NA 129 (L) 12/11/2020 1221   NA 130 (L) 03/05/2017 1110   K 3.8 12/11/2020 1221   CL 95 (L) 12/11/2020 1221   CO2 27 12/11/2020 1221   BUN 9 12/11/2020 1221   BUN 4 (L) 03/05/2017 1110   CREATININE 0.74 12/11/2020 1221   CREATININE 0.76 04/28/2020 0826      Component Value Date/Time   CALCIUM 9.2 12/11/2020 1221   ALKPHOS 78 12/11/2020 1221   AST 29 12/11/2020 1221   ALT 21 12/11/2020 1221   BILITOT 0.4 12/11/2020 1221       RADIOGRAPHIC  STUDIES: No results found.  ASSESSMENT AND PLAN: This is a very pleasant 65 years old white male with a stage IIIB non-small cell lung cancer, adenocarcinoma with no actionable mutations status post right upper lobectomy with lymph node dissection under the care of [name]. The patient completed a course of adjuvant treatment with cisplatin and Alimta status post 4 cycles.  He tolerated the previous 4 cycles of his treatment fairly well. The patient has been in observation but the recent imaging studies showed suspicious bone lesion in the left shoulder area. I ordered a PET scan which was performed on 09/25/2020 and it showed metastatic disease involving the left scapula and left supraclavicular lymph nodes. The patient had  ultrasound-guided core biopsy of the left supraclavicular lymph node and it was positive for metastatic carcinoma. He underwent palliative radiotherapy to the metastatic bone lesion in the left scapula and he is feeling much better. PD-L1 expression 20%. He was tested in the past for molecular study by foundation 1 and it was reported to be negative for actionable mutations. The patient started systemic chemotherapy with carboplatin for AUC of 5, Alimta 500 Mg/M2 and Keytruda 200 Mg IV every 3 weeks status post 2 cycles.  He tolerated the second cycle much better. I recommended for him to proceed with cycle #3 today as planned. I will see him back for follow-up visit in 3 weeks for evaluation with repeat CT scan of the chest, abdomen pelvis for restaging of his disease. The patient was advised to call immediately if he has any concerning symptoms in the interval. The patient voices understanding of current disease status and treatment options and is in agreement with the current care plan. All questions were answered. The patient knows to call the clinic with any problems, questions or concerns. We can certainly see the patient much sooner if necessary.  Disclaimer: This  note was dictated with voice recognition software. Similar sounding words can inadvertently be transcribed and may not be corrected upon review.

## 2020-12-18 NOTE — Patient Instructions (Signed)
[facility]  Discharge Instructions: Thank you for choosing [facility] to provide your oncology and hematology care.   If you have a lab appointment with the [facility], please go directly to the [facility] and check in at the registration area.   Wear comfortable clothing and clothing appropriate for easy access to any Portacath or PICC line.   We strive to give you quality time with your provider. You may need to reschedule your appointment if you arrive late (15 or more minutes).  Arriving late affects you and other patients whose appointments are after yours.  Also, if you miss three or more appointments without notifying the office, you may be dismissed from the clinic at the provider's discretion.      For prescription refill requests, have your pharmacy contact our office and allow 72 hours for refills to be completed.    Today you received the following chemotherapy and/or immunotherapy agents Keytruda, Alimta, and Carboplatin       To help prevent nausea and vomiting after your treatment, we encourage you to take your nausea medication as directed.  BELOW ARE SYMPTOMS THAT SHOULD BE REPORTED IMMEDIATELY: . *FEVER GREATER THAN 100.4 F (38 C) OR HIGHER . *CHILLS OR SWEATING . *NAUSEA AND VOMITING THAT IS NOT CONTROLLED WITH YOUR NAUSEA MEDICATION . *UNUSUAL SHORTNESS OF BREATH . *UNUSUAL BRUISING OR BLEEDING . *URINARY PROBLEMS (pain or burning when urinating, or frequent urination) . *BOWEL PROBLEMS (unusual diarrhea, constipation, pain near the anus) . TENDERNESS IN MOUTH AND THROAT WITH OR WITHOUT PRESENCE OF ULCERS (sore throat, sores in mouth, or a toothache) . UNUSUAL RASH, SWELLING OR PAIN  . UNUSUAL VAGINAL DISCHARGE OR ITCHING   Items with * indicate a potential emergency and should be followed up as soon as possible or go to the Emergency Department if any problems should occur.  Please show the CHEMOTHERAPY ALERT CARD  or IMMUNOTHERAPY ALERT CARD at check-in to the Emergency Department and triage nurse.  Should you have questions after your visit or need to cancel or reschedule your appointment, please contact [facility]  Dept: [phone]  and follow the prompts.  Office hours are 8:00 a.m. to 4:30 p.m. Monday - Friday. Please note that voicemails left after 4:00 p.m. may not be returned until the following business day.  We are closed weekends and major holidays. You have access to a nurse at all times for urgent questions. Please call the main number to the clinic Dept: [phone] and follow the prompts.   For any non-urgent questions, you may also contact your provider using MyChart. We now offer e-Visits for anyone 7 and older to request care online for non-urgent symptoms. For details visit [email or web address].   Also download the MyChart app! Go to the app store, search "MyChart", open the app, select [facility], and log in with your MyChart username and password.  Due to Covid, a mask is required upon entering the hospital/clinic. If you do not have a mask, one will be given to you upon arrival. For doctor visits, patients may have 1 support person aged 86 or older with them. For treatment visits, patients cannot have anyone with them due to current Covid guidelines and our immunocompromised population.

## 2020-12-19 ENCOUNTER — Ambulatory Visit: Payer: BC Managed Care – PPO | Admitting: Internal Medicine

## 2020-12-20 NOTE — Progress Notes (Signed)
  Patient Name: Sean Rose MRN: [id number] DOB: 05-Jul-1956 Referring Physician: [name] (Profile Not Attached) Date of Service: 10/24/2020 [facility], [place]                                                        End Of Treatment Note  Diagnoses: C79.51-Secondary malignant neoplasm of bone  Cancer Staging: STAGE IV    Intent: Palliative  Radiation Treatment Dates: 10/11/2020 through 10/24/2020 Site Technique Total Dose (Gy) Dose per Fx (Gy) Completed Fx Beam Energies  Scapula, Left: Chest_Lt_scapula 3D 30/30 3 10/10 6X, 10X   Narrative: The patient tolerated radiation therapy relatively well.   Plan: The patient will follow-up with radiation oncology in 63mo .  -----------------------------------  Eppie Gibson, MD

## 2020-12-22 ENCOUNTER — Other Ambulatory Visit: Payer: BC Managed Care – PPO

## 2020-12-26 ENCOUNTER — Other Ambulatory Visit: Payer: BC Managed Care – PPO

## 2020-12-26 ENCOUNTER — Other Ambulatory Visit: Payer: Self-pay

## 2020-12-26 ENCOUNTER — Inpatient Hospital Stay: Payer: BC Managed Care – PPO

## 2020-12-26 DIAGNOSIS — Z5111 Encounter for antineoplastic chemotherapy: Secondary | ICD-10-CM | POA: Diagnosis not present

## 2020-12-26 DIAGNOSIS — Z79899 Other long term (current) drug therapy: Secondary | ICD-10-CM | POA: Diagnosis not present

## 2020-12-26 DIAGNOSIS — Z95828 Presence of other vascular implants and grafts: Secondary | ICD-10-CM

## 2020-12-26 DIAGNOSIS — E785 Hyperlipidemia, unspecified: Secondary | ICD-10-CM

## 2020-12-26 DIAGNOSIS — Z5112 Encounter for antineoplastic immunotherapy: Secondary | ICD-10-CM | POA: Diagnosis not present

## 2020-12-26 DIAGNOSIS — C3411 Malignant neoplasm of upper lobe, right bronchus or lung: Secondary | ICD-10-CM | POA: Diagnosis not present

## 2020-12-26 DIAGNOSIS — C3491 Malignant neoplasm of unspecified part of right bronchus or lung: Secondary | ICD-10-CM

## 2020-12-26 LAB — CMP (CANCER CENTER ONLY)
ALT: 21 U/L (ref 0–44)
AST: 30 U/L (ref 15–41)
Albumin: 3.7 g/dL (ref 3.5–5.0)
Alkaline Phosphatase: 64 U/L (ref 38–126)
Anion gap: 11 (ref 5–15)
BUN: 7 mg/dL — ABNORMAL LOW (ref 8–23)
CO2: 23 mmol/L (ref 22–32)
Calcium: 9.1 mg/dL (ref 8.9–10.3)
Chloride: 92 mmol/L — ABNORMAL LOW (ref 98–111)
Creatinine: 0.69 mg/dL (ref 0.61–1.24)
GFR, Estimated: >60 mL/min (ref >60)
Glucose, Bld: 102 mg/dL — ABNORMAL HIGH (ref 70–99)
Potassium: 4.2 mmol/L (ref 3.5–5.1)
Sodium: 126 mmol/L — ABNORMAL LOW (ref 135–145)
Total Bilirubin: 0.6 mg/dL (ref 0.3–1.2)
Total Protein: 6.9 g/dL (ref 6.5–8.1)

## 2020-12-26 LAB — CBC WITH DIFFERENTIAL (CANCER CENTER ONLY)
Abs Immature Granulocytes: 0.01 10*3/uL (ref 0.00–0.07)
Basophils Absolute: 0 10*3/uL (ref 0.0–0.1)
Basophils Relative: 0 %
Eosinophils Absolute: 0 10*3/uL (ref 0.0–0.5)
Eosinophils Relative: 1 %
HCT: 24.4 % — ABNORMAL LOW (ref 39.0–52.0)
Hemoglobin: 8.9 g/dL — ABNORMAL LOW (ref 13.0–17.0)
Immature Granulocytes: 0 %
Lymphocytes Relative: 49 %
Lymphs Abs: 1.6 10*3/uL (ref 0.7–4.0)
MCH: 37.7 pg — ABNORMAL HIGH (ref 26.0–34.0)
MCHC: 36.5 g/dL — ABNORMAL HIGH (ref 30.0–36.0)
MCV: 103.4 fL — ABNORMAL HIGH (ref 80.0–100.0)
Monocytes Absolute: 0.4 10*3/uL (ref 0.1–1.0)
Monocytes Relative: 11 %
Neutro Abs: 1.3 10*3/uL — ABNORMAL LOW (ref 1.7–7.7)
Neutrophils Relative %: 39 %
Platelet Count: 218 10*3/uL (ref 150–400)
RBC: 2.36 MIL/uL — ABNORMAL LOW (ref 4.22–5.81)
RDW: 16.7 % — ABNORMAL HIGH (ref 11.5–15.5)
WBC Count: 3.4 10*3/uL — ABNORMAL LOW (ref 4.0–10.5)
nRBC: 0 % (ref 0.0–0.2)

## 2020-12-26 MED ORDER — SODIUM CHLORIDE 0.9% FLUSH
10.0000 mL | Freq: Once | INTRAVENOUS | Status: AC
  Administered 2020-12-26: 10 mL
  Filled 2020-12-26: qty 10

## 2020-12-26 MED ORDER — HEPARIN SOD (PORK) LOCK FLUSH 100 UNIT/ML IV SOLN
500.0000 [IU] | Freq: Once | INTRAVENOUS | Status: AC
  Administered 2020-12-26: 500 [IU]
  Filled 2020-12-26: qty 5

## 2020-12-26 NOTE — Patient Instructions (Signed)

## 2021-01-02 ENCOUNTER — Other Ambulatory Visit: Payer: Self-pay

## 2021-01-02 ENCOUNTER — Inpatient Hospital Stay: Payer: BC Managed Care – PPO | Attending: Internal Medicine

## 2021-01-02 DIAGNOSIS — C770 Secondary and unspecified malignant neoplasm of lymph nodes of head, face and neck: Secondary | ICD-10-CM | POA: Diagnosis not present

## 2021-01-02 DIAGNOSIS — Z79899 Other long term (current) drug therapy: Secondary | ICD-10-CM | POA: Diagnosis not present

## 2021-01-02 DIAGNOSIS — D6481 Anemia due to antineoplastic chemotherapy: Secondary | ICD-10-CM | POA: Insufficient documentation

## 2021-01-02 DIAGNOSIS — Z5111 Encounter for antineoplastic chemotherapy: Secondary | ICD-10-CM | POA: Insufficient documentation

## 2021-01-02 DIAGNOSIS — Z95828 Presence of other vascular implants and grafts: Secondary | ICD-10-CM

## 2021-01-02 DIAGNOSIS — T451X5A Adverse effect of antineoplastic and immunosuppressive drugs, initial encounter: Secondary | ICD-10-CM | POA: Insufficient documentation

## 2021-01-02 DIAGNOSIS — Z5112 Encounter for antineoplastic immunotherapy: Secondary | ICD-10-CM | POA: Insufficient documentation

## 2021-01-02 DIAGNOSIS — C3411 Malignant neoplasm of upper lobe, right bronchus or lung: Secondary | ICD-10-CM | POA: Diagnosis not present

## 2021-01-02 DIAGNOSIS — C3491 Malignant neoplasm of unspecified part of right bronchus or lung: Secondary | ICD-10-CM

## 2021-01-02 LAB — CBC WITH DIFFERENTIAL (CANCER CENTER ONLY)
Abs Immature Granulocytes: 0.01 10*3/uL (ref 0.00–0.07)
Basophils Absolute: 0 10*3/uL (ref 0.0–0.1)
Basophils Relative: 0 %
Eosinophils Absolute: 0 10*3/uL (ref 0.0–0.5)
Eosinophils Relative: 0 %
HCT: 24.3 % — ABNORMAL LOW (ref 39.0–52.0)
Hemoglobin: 9 g/dL — ABNORMAL LOW (ref 13.0–17.0)
Immature Granulocytes: 0 %
Lymphocytes Relative: 30 %
Lymphs Abs: 1.4 10*3/uL (ref 0.7–4.0)
MCH: 38.3 pg — ABNORMAL HIGH (ref 26.0–34.0)
MCHC: 37 g/dL — ABNORMAL HIGH (ref 30.0–36.0)
MCV: 103.4 fL — ABNORMAL HIGH (ref 80.0–100.0)
Monocytes Absolute: 0.6 10*3/uL (ref 0.1–1.0)
Monocytes Relative: 13 %
Neutro Abs: 2.6 10*3/uL (ref 1.7–7.7)
Neutrophils Relative %: 57 %
Platelet Count: 145 10*3/uL — ABNORMAL LOW (ref 150–400)
RBC: 2.35 MIL/uL — ABNORMAL LOW (ref 4.22–5.81)
RDW: 17.3 % — ABNORMAL HIGH (ref 11.5–15.5)
WBC Count: 4.7 10*3/uL (ref 4.0–10.5)
nRBC: 0 % (ref 0.0–0.2)

## 2021-01-02 LAB — CMP (CANCER CENTER ONLY)
ALT: 14 U/L (ref 0–44)
AST: 24 U/L (ref 15–41)
Albumin: 3.9 g/dL (ref 3.5–5.0)
Alkaline Phosphatase: 72 U/L (ref 38–126)
Anion gap: 13 (ref 5–15)
BUN: 5 mg/dL — ABNORMAL LOW (ref 8–23)
CO2: 21 mmol/L — ABNORMAL LOW (ref 22–32)
Calcium: 9.1 mg/dL (ref 8.9–10.3)
Chloride: 93 mmol/L — ABNORMAL LOW (ref 98–111)
Creatinine: 0.74 mg/dL (ref 0.61–1.24)
GFR, Estimated: >60 mL/min (ref >60)
Glucose, Bld: 113 mg/dL — ABNORMAL HIGH (ref 70–99)
Potassium: 4.1 mmol/L (ref 3.5–5.1)
Sodium: 127 mmol/L — ABNORMAL LOW (ref 135–145)
Total Bilirubin: 0.4 mg/dL (ref 0.3–1.2)
Total Protein: 7.2 g/dL (ref 6.5–8.1)

## 2021-01-02 MED ORDER — SODIUM CHLORIDE 0.9% FLUSH
10.0000 mL | Freq: Once | INTRAVENOUS | Status: AC
  Administered 2021-01-02: 10 mL
  Filled 2021-01-02: qty 10

## 2021-01-02 MED ORDER — HEPARIN SOD (PORK) LOCK FLUSH 100 UNIT/ML IV SOLN
500.0000 [IU] | Freq: Once | INTRAVENOUS | Status: AC
  Administered 2021-01-02: 500 [IU]
  Filled 2021-01-02: qty 5

## 2021-01-05 ENCOUNTER — Ambulatory Visit (HOSPITAL_COMMUNITY)
Admission: RE | Admit: 2021-01-05 | Discharge: 2021-01-05 | Disposition: A | Discharge status: Home / Self Care | Payer: BC Managed Care – PPO | Source: Ambulatory Visit | Attending: Internal Medicine | Admitting: Internal Medicine

## 2021-01-05 ENCOUNTER — Other Ambulatory Visit: Payer: Self-pay

## 2021-01-05 ENCOUNTER — Encounter (HOSPITAL_COMMUNITY): Payer: Self-pay

## 2021-01-05 DIAGNOSIS — C7951 Secondary malignant neoplasm of bone: Secondary | ICD-10-CM | POA: Diagnosis not present

## 2021-01-05 DIAGNOSIS — I2510 Atherosclerotic heart disease of native coronary artery without angina pectoris: Secondary | ICD-10-CM | POA: Diagnosis not present

## 2021-01-05 DIAGNOSIS — M47814 Spondylosis without myelopathy or radiculopathy, thoracic region: Secondary | ICD-10-CM | POA: Diagnosis not present

## 2021-01-05 DIAGNOSIS — K3189 Other diseases of stomach and duodenum: Secondary | ICD-10-CM | POA: Diagnosis not present

## 2021-01-05 DIAGNOSIS — C3490 Malignant neoplasm of unspecified part of unspecified bronchus or lung: Secondary | ICD-10-CM

## 2021-01-05 DIAGNOSIS — I77811 Abdominal aortic ectasia: Secondary | ICD-10-CM | POA: Diagnosis not present

## 2021-01-05 MED ORDER — HEPARIN SOD (PORK) LOCK FLUSH 100 UNIT/ML IV SOLN
INTRAVENOUS | Status: AC
  Filled 2021-01-05: qty 5

## 2021-01-05 MED ORDER — HEPARIN SOD (PORK) LOCK FLUSH 100 UNIT/ML IV SOLN
500.0000 [IU] | Freq: Once | INTRAVENOUS | Status: AC
  Administered 2021-01-05: 500 [IU] via INTRAVENOUS

## 2021-01-05 MED ORDER — IOHEXOL 300 MG/ML  SOLN
100.0000 mL | Freq: Once | INTRAMUSCULAR | Status: AC | PRN
  Administered 2021-01-05: 100 mL via INTRAVENOUS

## 2021-01-08 ENCOUNTER — Inpatient Hospital Stay: Payer: BC Managed Care – PPO

## 2021-01-08 ENCOUNTER — Encounter: Payer: Self-pay | Admitting: *Deleted

## 2021-01-08 ENCOUNTER — Inpatient Hospital Stay (HOSPITAL_BASED_OUTPATIENT_CLINIC_OR_DEPARTMENT_OTHER): Payer: BC Managed Care – PPO | Admitting: Internal Medicine

## 2021-01-08 ENCOUNTER — Other Ambulatory Visit: Payer: Self-pay

## 2021-01-08 ENCOUNTER — Other Ambulatory Visit: Payer: Self-pay | Admitting: Internal Medicine

## 2021-01-08 VITALS — BP 121/78 | HR 69 | Temp 97.6°F | Resp 18 | Ht 68.5 in | Wt 136.2 lb

## 2021-01-08 DIAGNOSIS — C3491 Malignant neoplasm of unspecified part of right bronchus or lung: Secondary | ICD-10-CM | POA: Diagnosis not present

## 2021-01-08 DIAGNOSIS — Z5111 Encounter for antineoplastic chemotherapy: Secondary | ICD-10-CM | POA: Diagnosis not present

## 2021-01-08 DIAGNOSIS — C770 Secondary and unspecified malignant neoplasm of lymph nodes of head, face and neck: Secondary | ICD-10-CM | POA: Diagnosis not present

## 2021-01-08 DIAGNOSIS — Z79899 Other long term (current) drug therapy: Secondary | ICD-10-CM | POA: Diagnosis not present

## 2021-01-08 DIAGNOSIS — C3411 Malignant neoplasm of upper lobe, right bronchus or lung: Secondary | ICD-10-CM | POA: Diagnosis not present

## 2021-01-08 DIAGNOSIS — I10 Essential (primary) hypertension: Secondary | ICD-10-CM | POA: Diagnosis not present

## 2021-01-08 DIAGNOSIS — C7951 Secondary malignant neoplasm of bone: Secondary | ICD-10-CM

## 2021-01-08 DIAGNOSIS — T451X5A Adverse effect of antineoplastic and immunosuppressive drugs, initial encounter: Secondary | ICD-10-CM | POA: Diagnosis not present

## 2021-01-08 DIAGNOSIS — Z5112 Encounter for antineoplastic immunotherapy: Secondary | ICD-10-CM

## 2021-01-08 DIAGNOSIS — D6481 Anemia due to antineoplastic chemotherapy: Secondary | ICD-10-CM | POA: Diagnosis not present

## 2021-01-08 MED ORDER — SODIUM CHLORIDE 0.9 % IV SOLN
500.0000 mg/m2 | Freq: Once | INTRAVENOUS | Status: AC
  Administered 2021-01-08: 900 mg via INTRAVENOUS
  Filled 2021-01-08: qty 20

## 2021-01-08 MED ORDER — SODIUM CHLORIDE 0.9 % IV SOLN
Freq: Once | INTRAVENOUS | Status: AC
Start: 2021-01-08 — End: 2021-01-08
  Filled 2021-01-08: qty 250

## 2021-01-08 MED ORDER — SODIUM CHLORIDE 0.9 % IV SOLN
200.0000 mg | Freq: Once | INTRAVENOUS | Status: AC
  Administered 2021-01-08: 200 mg via INTRAVENOUS
  Filled 2021-01-08: qty 8

## 2021-01-08 MED ORDER — SODIUM CHLORIDE 0.9 % IV SOLN
10.0000 mg | Freq: Once | INTRAVENOUS | Status: AC
  Administered 2021-01-08: 10 mg via INTRAVENOUS
  Filled 2021-01-08: qty 10

## 2021-01-08 MED ORDER — HEPARIN SOD (PORK) LOCK FLUSH 100 UNIT/ML IV SOLN
500.0000 [IU] | Freq: Once | INTRAVENOUS | Status: AC | PRN
  Administered 2021-01-08: 500 [IU]
  Filled 2021-01-08: qty 5

## 2021-01-08 MED ORDER — SODIUM CHLORIDE 0.9 % IV SOLN
150.0000 mg | Freq: Once | INTRAVENOUS | Status: AC
  Administered 2021-01-08: 150 mg via INTRAVENOUS
  Filled 2021-01-08: qty 150

## 2021-01-08 MED ORDER — PALONOSETRON HCL INJECTION 0.25 MG/5ML
0.2500 mg | Freq: Once | INTRAVENOUS | Status: AC
  Administered 2021-01-08: 0.25 mg via INTRAVENOUS

## 2021-01-08 MED ORDER — PALONOSETRON HCL INJECTION 0.25 MG/5ML
INTRAVENOUS | Status: AC
  Filled 2021-01-08: qty 5

## 2021-01-08 MED ORDER — SODIUM CHLORIDE 0.9 % IV SOLN
534.5000 mg | Freq: Once | INTRAVENOUS | Status: AC
  Administered 2021-01-08: 530 mg via INTRAVENOUS
  Filled 2021-01-08: qty 53

## 2021-01-08 MED ORDER — SODIUM CHLORIDE 0.9% FLUSH
10.0000 mL | INTRAVENOUS | Status: DC | PRN
  Administered 2021-01-08: 10 mL
  Filled 2021-01-08: qty 10

## 2021-01-08 NOTE — Progress Notes (Signed)
Spoke with Mr. Sean Rose today at his visit with Dr. [name].  He is doing well on active treatment. No barriers identified at this time.

## 2021-01-08 NOTE — Progress Notes (Signed)
[facility] [phone]   Fax:([phone]  OFFICE PROGRESS NOTE  Sean Peng, NP [place] [address]  DIAGNOSIS: Metastatic non-small cell lung cancer initially diagnosed as stage IIIA (T3, N1, M0) non-small cell lung cancer, adenocarcinoma with no actionable mutations presented with multiple pulmonary nodules in the right upper lobe as well as metastatic disease in intraparenchymal lymphadenopathy.  This was diagnosed in February 2020.  The patient has disease recurrence in March 2022. PD-L1 expression 20%. He has no actionable mutations by foundation 1.  PRIOR THERAPY: 1) Status post right upper lobectomy with lymph node dissection. 2) Adjuvant systemic chemotherapy with cisplatin 75 mg/M2 and Alimta 500 mg/M2 every 3 weeks.  Status post 4 cycles. 3) palliative radiotherapy to the metastatic bone disease and the scapula as well as the left supraclavicular area under the care of Dr. Isidore Moos  CURRENT THERAPY: Systemic chemotherapy with carboplatin for AUC of 5, Alimta 500 mg/M2 and Keytruda 200 mg IV every 3 weeks.  First dose November 06, 2020.  Status post 3 cycles  INTERVAL HISTORY: Sean Rose 65 y.o. male returns to the clinic today for follow-up visit.  The patient is feeling fine today with no concerning complaints except for mild fatigue from the chemotherapy-induced anemia.  He denied having any current chest pain, shortness of breath, cough or hemoptysis.  He denied having any fever or chills.  He has no nausea, vomiting, diarrhea or constipation.  He denied having any headache or visual changes.  He has no weight loss or night sweats.  He had repeat CT scan of the chest, abdomen pelvis performed recently and he is here for evaluation and discussion of his scan results.    MEDICAL HISTORY: Past Medical History:  Diagnosis Date   Anginal pain ([facility])    CAD (coronary artery disease)    CAP (community acquired  pneumonia) 09/2016   COPD (chronic obstructive pulmonary disease) (HCC)    GERD (gastroesophageal reflux disease)    Heart murmur    "I was told I had one when I was a kid"   Hemorrhoids    History of anal fissures    "no surgeries" (10/30/2016)   Hyperlipidemia    Hypertension    lung ca dx'd 08/2018   Peripheral arterial disease ([facility])    status post right common iliac artery stenting back in 2007   Seasonal allergies    Tobacco abuse     ALLERGIES:  is allergic to compazine [prochlorperazine].  MEDICATIONS:  Current Outpatient Medications  Medication Sig Dispense Refill   amLODipine (NORVASC) 10 MG tablet TAKE 1 TABLET(10 MG) BY MOUTH DAILY (Patient taking differently: Take 10 mg by mouth daily.) 90 tablet 3   aspirin EC 81 MG tablet Take 81 mg by mouth daily.     clopidogrel (PLAVIX) 75 MG tablet TAKE 1 TABLET BY MOUTH EVERY DAY (Patient taking differently: Take 75 mg by mouth daily.) 90 tablet 3   esomeprazole (NEXIUM) 20 MG capsule Take 40 mg by mouth at bedtime.     folic acid (FOLVITE) 1 MG tablet Take 1 tablet (1 mg total) by mouth daily. 30 tablet 4   lidocaine-prilocaine (EMLA) cream Apply to the Port-A-Cath site 30-60 minutes before treatment. 30 g 0   Magnesium 250 MG TABS Take 250 mg by mouth daily.     metoprolol succinate (TOPROL-XL) 100 MG 24 hr tablet TAKE 1 TABLET BY MOUTH EVERY DAY 90 tablet 3  Multiple Vitamin (MULTIVITAMIN WITH MINERALS) TABS tablet Take 1 tablet by mouth daily.     ondansetron (ZOFRAN) 8 MG tablet Take 1 tablet (8 mg total) by mouth every 8 (eight) hours as needed for nausea or vomiting. 20 tablet 0   umeclidinium-vilanterol (ANORO ELLIPTA) 62.5-25 MCG/INH AEPB Inhale 1 puff into the lungs daily. 60 each 5   diphenhydrAMINE (BENADRYL) 25 mg capsule Take 25 mg by mouth daily as needed for allergies.  (Patient not taking: Reported on 01/08/2021)     ibuprofen (ADVIL,MOTRIN) 200 MG tablet Take 400 mg by mouth every 6 (six) hours as needed for  headache or moderate pain.      nitroGLYCERIN (NITROSTAT) 0.4 MG SL tablet PLACE 1 TABLET UNDER THE TONGUE EVERY 5 MINUTES AS NEEDED FOR CHEST PAIN. (Patient not taking: No sig reported) 25 tablet 6   rosuvastatin (CRESTOR) 5 MG tablet Take 1 tablet (5 mg total) by mouth every other day. 45 tablet 3   No current facility-administered medications for this visit.    SURGICAL HISTORY:  Past Surgical History:  Procedure Laterality Date   ANKLE SURGERY Left    "rebuilt it"   ANTERIOR CRUCIATE LIGAMENT REPAIR Right    CARDIAC CATHETERIZATION  09/23/2008   Continued medical therapy - may need GI evaluation in addition.   CARDIAC CATHETERIZATION  10/28/2007   Medical therapy recommended.   CARDIAC CATHETERIZATION  11/18/2006   In-stent restenosis RCA  (50% distal edge, 80% segmental mid, and 50-60% segmental proximal). Successful cutting balloon atherectomy using a [id number] cutting balloon. 3 inflations with atherectomy performed on mid and proximal portions resulting in reduction of 80% mid in-stent restenosis to less than 20% residual and 50-60% segmental proximal to less than 20% residual without dissection.   CARDIAC CATHETERIZATION  02/26/2006   Severe stenosis in RCA. Stenting performed using IVUS. 3.5x20 Maverick balloon deployed at [employer]. Distal stent-a 4x28 Liberte stent-deployed 12atm 48sec, 12atm 31sec, 4atm 19sec. Mid stent-a 4x28 Liberte stent-deployed 14atm 45sec, 14atm 60sec, 14atm 44sec. Proximal stent-4x8 Liberte- 14atm 45sec,14atm 47sec, 16atm 43sec. Severely diseased segment then appeared TIMI-3 flow.   CARDIOVASCULAR STRESS TEST  11/17/2012   No significant ECG changes. Septal perfusion defect is new when complared to study from 2010. Abnormal myocardial perfusion imaging with a basal to mid perfusion suggestive of previous MI.   CAROTID DOPPLER  08/09/2011   Bilateral Bulb/Proximal ICA - demonstrated a mild amount of fibrous plaque without evidence of significant diameter reduction  reduction or other vascular abnormality.   CHEST TUBE INSERTION Right 09/02/2018   Procedure: Chest Tube Insertion;  Surgeon: Garner Nash, DO;  Location: [facility];  Service: Thoracic;  Laterality: Right;   COLONOSCOPY     2003, 2014   CORONARY ANGIOPLASTY WITH STENT PLACEMENT     FEMORAL ARTERY STENT     INGUINAL HERNIA REPAIR Right    IR IMAGING GUIDED PORT INSERTION  11/13/2020   KNEE ARTHROSCOPY Right "multiple"   LAPAROSCOPIC APPENDECTOMY N/A 12/07/2018   Procedure: APPENDECTOMY LAPAROSCOPIC;  Surgeon: Coralie Keens, MD;  Location: [facility];  Service: General;  Laterality: N/A;   LOWER EXTREMITY ARTERIAL DOPPLER  01/31/2011   Bilateral ABIs-normal values with no suggestion of arterial insuff to the lower extremities at rest. Right CIA stent-mild amount of nonhemodynamically significant plaque is noted throughout   [place]   "bone-eating tumor"   PERCUTANEOUS STENT INTERVENTION  04/04/2006 & 04/13/2015   a. Right common iliac artery with an 8.0x18 mm Herculink stent deployed  at 12 atm. Stenosis was reduced from 80% to 0% with brisk flow. b. I-cast stenting to left common iliac artery   PERIPHERAL VASCULAR CATHETERIZATION N/A 04/13/2015   Procedure: Lower Extremity Angiography;  Surgeon: Lorretta Harp, MD; L-oCIA 75%, 40-50% L-EIA, R-CIA stent patent, s/p 8 mm x 38 mm ICast covered stent>>0% stenosis in [place] Right    TRANSTHORACIC ECHOCARDIOGRAM  11/26/2012   EF not noted. Aortic valve-sclerosis without stenosis, no regurgiation.    UPPER GASTROINTESTINAL ENDOSCOPY     US CAROTID DOPPLER BILATERAL ([place] HX)  08/09/2011   Bilateral Bulb/Proximal ICAa demonstrated a mild amount of fibrous plaque without evidence of significant diameter reduction or any other vascular abnormality.   VIDEO ASSISTED THORACOSCOPY (VATS)/ LOBECTOMY Right 09/14/2018   Procedure: RIGHT VIDEO ASSISTED THORACOSCOPY (VATS)/ RIGHT UPPER LOBECTOMY;   Surgeon: Melrose Nakayama, MD;  Location: [facility];  Service: Thoracic;  Laterality: Right;   VIDEO BRONCHOSCOPY WITH ENDOBRONCHIAL NAVIGATION N/A 09/02/2018   Procedure: VIDEO BRONCHOSCOPY WITH ENDOBRONCHIAL NAVIGATION;  Surgeon: Garner Nash, DO;  Location: [facility];  Service: Thoracic;  Laterality: N/A;    REVIEW OF SYSTEMS:  Constitutional: positive for fatigue Eyes: negative Ears, nose, mouth, throat, and face: negative Respiratory: negative Cardiovascular: negative Gastrointestinal: negative Genitourinary:negative Integument/breast: negative Hematologic/lymphatic: negative Musculoskeletal:negative Neurological: negative Behavioral/Psych: negative Endocrine: negative Allergic/Immunologic: negative   PHYSICAL EXAMINATION: General appearance: alert, cooperative, fatigued, and no distress Head: Normocephalic, without obvious abnormality, atraumatic Neck: no adenopathy, no JVD, supple, symmetrical, trachea midline, and thyroid not enlarged, symmetric, no tenderness/mass/nodules Lymph nodes: Cervical, supraclavicular, and axillary nodes normal. Resp: clear to auscultation bilaterally Back: symmetric, no curvature. ROM normal. No CVA tenderness. Cardio: regular rate and rhythm, S1, S2 normal, no murmur, click, rub or gallop GI: soft, non-tender; bowel sounds normal; no masses,  no organomegaly Extremities: extremities normal, atraumatic, no cyanosis or edema Neurologic: Alert and oriented X 3, normal strength and tone. Normal symmetric reflexes. Normal coordination and gait  ECOG PERFORMANCE STATUS: 1 - Symptomatic but completely ambulatory  Blood pressure 121/78, pulse 69, temperature 97.6 F (36.4 C), temperature source Tympanic, resp. rate 18, height 5' 8.5" (1.74 m), weight 136 lb 3.2 oz (61.8 kg), SpO2 100 %.  LABORATORY DATA: Lab Results  Component Value Date   WBC 4.7 01/02/2021   HGB 9.0 (L) 01/02/2021   HCT 24.3 (L) 01/02/2021   MCV 103.4 (H) 01/02/2021   PLT 145  (L) 01/02/2021      Chemistry      Component Value Date/Time   NA 127 (L) 01/02/2021 1157   NA 130 (L) 03/05/2017 1110   K 4.1 01/02/2021 1157   CL 93 (L) 01/02/2021 1157   CO2 21 (L) 01/02/2021 1157   BUN 5 (L) 01/02/2021 1157   BUN 4 (L) 03/05/2017 1110   CREATININE 0.74 01/02/2021 1157   CREATININE 0.76 04/28/2020 0826      Component Value Date/Time   CALCIUM 9.1 01/02/2021 1157   ALKPHOS 72 01/02/2021 1157   AST 24 01/02/2021 1157   ALT 14 01/02/2021 1157   BILITOT 0.4 01/02/2021 1157       RADIOGRAPHIC STUDIES: CT Chest W Contrast  Result Date: 01/07/2021 CLINICAL DATA:  Non-small cell lung cancer, diagnosed 2020, status post right upper lobectomy, chemotherapy in progress, XRT complete. EXAM: CT CHEST, ABDOMEN, AND PELVIS WITH CONTRAST TECHNIQUE: Multidetector CT imaging of the chest, abdomen and pelvis was performed following the standard protocol during bolus  administration of intravenous contrast. CONTRAST:  178m OMNIPAQUE IOHEXOL 300 MG/ML  SOLN COMPARISON:  PET-CT dated 09/17/2020. FINDINGS: CT CHEST FINDINGS Cardiovascular: The heart is normal in size. No pericardial effusion. No evidence of thoracic aortic aneurysm. Atherosclerotic calcifications of the aortic arch. Coronary atherosclerosis of the LAD and right coronary artery. Right chest port terminates at the cavoatrial junction. Mediastinum/Nodes: No suspicious mediastinal, hilar, or axillary lymphadenopathy. Two residual left supraclavicular/posterior cervical lymph nodes measuring 4-5 mm short axis (series 2/images 1 and 3), previously measuring up to 9 mm short axis. Lungs/Pleura: Status post right upper lobectomy. Moderate centrilobular and paraseptal emphysematous changes, upper lung predominant. Subpleural scarring in the bilateral lower lobes. No suspicious pulmonary nodules. No focal consolidation. No pleural effusion or pneumothorax. Musculoskeletal: Lytic metastasis involving the left lateral scapula (series  2/image 9), extending to the glenoid (series 2/image 5), although this is less conspicuous and favored to be improved. Clavicles, sternum, bilateral ribs, and thoracic spine are intact. Mild degenerative changes of the mid/lower thoracic spine. CT ABDOMEN PELVIS FINDINGS Hepatobiliary: Liver is within normal limits. Gallbladder is unremarkable. No intrahepatic or extrahepatic ductal dilatation. Pancreas: Within normal limits. Spleen: Normal limits. Adrenals/Urinary Tract: Adrenal glands are within normal limits. Kidneys are within normal limits.  No hydronephrosis. Bladder is within normal limits. Stomach/Bowel: Persistent wall thickening involving the body of the stomach (series 2/image 59), unchanged dating back to December 2019. Relative sparing of the distal gastric antrum. No evidence of bowel obstruction. Prior appendectomy. No colonic wall thickening or inflammatory changes. Vascular/Lymphatic: Fusiform ectasia of the infrarenal abdominal aorta, measuring 2.5 cm. Atherosclerotic calcifications of the abdominal aorta and branch vessels. No suspicious abdominopelvic lymphadenopathy. Reproductive: Prostate is unremarkable. Other: No abdominopelvic ascites. Musculoskeletal: No focal osseous lesions. IMPRESSION: Improving left supraclavicular/posterior cervical nodal metastases, now measuring 4-5 mm short axis. Improving lytic metastasis involving the left lateral scapula. No evidence of new/progressive metastatic disease. Persistent wall thickening involving the body of the stomach, unchanged dating back to December 2019. Correlate with endoscopy as clinically warranted. Electronically Signed   By: SJulian HyM.D.   On: 01/07/2021 22:01   CT Abdomen Pelvis W Contrast  Result Date: 01/07/2021 CLINICAL DATA:  Non-small cell lung cancer, diagnosed 2020, status post right upper lobectomy, chemotherapy in progress, XRT complete. EXAM: CT CHEST, ABDOMEN, AND PELVIS WITH CONTRAST TECHNIQUE: Multidetector CT  imaging of the chest, abdomen and pelvis was performed following the standard protocol during bolus administration of intravenous contrast. CONTRAST:  1080mOMNIPAQUE IOHEXOL 300 MG/ML  SOLN COMPARISON:  PET-CT dated 09/17/2020. FINDINGS: CT CHEST FINDINGS Cardiovascular: The heart is normal in size. No pericardial effusion. No evidence of thoracic aortic aneurysm. Atherosclerotic calcifications of the aortic arch. Coronary atherosclerosis of the LAD and right coronary artery. Right chest port terminates at the cavoatrial junction. Mediastinum/Nodes: No suspicious mediastinal, hilar, or axillary lymphadenopathy. Two residual left supraclavicular/posterior cervical lymph nodes measuring 4-5 mm short axis (series 2/images 1 and 3), previously measuring up to 9 mm short axis. Lungs/Pleura: Status post right upper lobectomy. Moderate centrilobular and paraseptal emphysematous changes, upper lung predominant. Subpleural scarring in the bilateral lower lobes. No suspicious pulmonary nodules. No focal consolidation. No pleural effusion or pneumothorax. Musculoskeletal: Lytic metastasis involving the left lateral scapula (series 2/image 9), extending to the glenoid (series 2/image 5), although this is less conspicuous and favored to be improved. Clavicles, sternum, bilateral ribs, and thoracic spine are intact. Mild degenerative changes of the mid/lower thoracic spine. CT ABDOMEN PELVIS FINDINGS Hepatobiliary: Liver is within normal limits.  Gallbladder is unremarkable. No intrahepatic or extrahepatic ductal dilatation. Pancreas: Within normal limits. Spleen: Normal limits. Adrenals/Urinary Tract: Adrenal glands are within normal limits. Kidneys are within normal limits.  No hydronephrosis. Bladder is within normal limits. Stomach/Bowel: Persistent wall thickening involving the body of the stomach (series 2/image 59), unchanged dating back to December 2019. Relative sparing of the distal gastric antrum. No evidence of bowel  obstruction. Prior appendectomy. No colonic wall thickening or inflammatory changes. Vascular/Lymphatic: Fusiform ectasia of the infrarenal abdominal aorta, measuring 2.5 cm. Atherosclerotic calcifications of the abdominal aorta and branch vessels. No suspicious abdominopelvic lymphadenopathy. Reproductive: Prostate is unremarkable. Other: No abdominopelvic ascites. Musculoskeletal: No focal osseous lesions. IMPRESSION: Improving left supraclavicular/posterior cervical nodal metastases, now measuring 4-5 mm short axis. Improving lytic metastasis involving the left lateral scapula. No evidence of new/progressive metastatic disease. Persistent wall thickening involving the body of the stomach, unchanged dating back to December 2019. Correlate with endoscopy as clinically warranted. Electronically Signed   By: Julian Hy M.D.   On: 01/07/2021 22:01    ASSESSMENT AND PLAN: This is a very pleasant 65 years old white male with a stage IIIB non-small cell lung cancer, adenocarcinoma with no actionable mutations status post right upper lobectomy with lymph node dissection under the care of [name]. The patient completed a course of adjuvant treatment with cisplatin and Alimta status post 4 cycles.  He tolerated the previous 4 cycles of his treatment fairly well. The patient has been in observation but the recent imaging studies showed suspicious bone lesion in the left shoulder area. I ordered a PET scan which was performed on 09/25/2020 and it showed metastatic disease involving the left scapula and left supraclavicular lymph nodes. The patient had ultrasound-guided core biopsy of the left supraclavicular lymph node and it was positive for metastatic carcinoma. He underwent palliative radiotherapy to the metastatic bone lesion in the left scapula and he is feeling much better. PD-L1 expression 20%. He was tested in the past for molecular study by foundation 1 and it was reported to be negative for  actionable mutations. The patient started systemic chemotherapy with carboplatin for AUC of 5, Alimta 500 Mg/M2 and Keytruda 200 Mg IV every 3 weeks status post 3 cycles.  The patient continues to tolerate his treatment well with no concerning adverse effects except for the fatigue from the chemotherapy-induced anemia. He had repeat CT scan of the chest, abdomen pelvis performed recently.  I personally and independently reviewed the scans and discussed the results with the patient today. His scan showed significant improvement of his disease. I recommended for him to continue his current treatment with systemic chemotherapy and he will proceed with cycle #4 today. I will see the patient back for follow-up visit in 3 weeks for evaluation before starting the first cycle of his maintenance treatment with Alimta and Keytruda. The patient was advised to call immediately if he has any concerning symptoms in the interval.  The patient voices understanding of current disease status and treatment options and is in agreement with the current care plan. All questions were answered. The patient knows to call the clinic with any problems, questions or concerns. We can certainly see the patient much sooner if necessary.  Disclaimer: This note was dictated with voice recognition software. Similar sounding words can inadvertently be transcribed and may not be corrected upon review.

## 2021-01-08 NOTE — Patient Instructions (Signed)
[facility]  Discharge Instructions: Thank you for choosing [facility] to provide your oncology and hematology care.   If you have a lab appointment with the [facility], please go directly to the [facility] and check in at the registration area.   Wear comfortable clothing and clothing appropriate for easy access to any Portacath or PICC line.   We strive to give you quality time with your provider. You may need to reschedule your appointment if you arrive late (15 or more minutes).  Arriving late affects you and other patients whose appointments are after yours.  Also, if you miss three or more appointments without notifying the office, you may be dismissed from the clinic at the provider's discretion.      For prescription refill requests, have your pharmacy contact our office and allow 72 hours for refills to be completed.    Today you received the following chemotherapy and/or immunotherapy agents herceptin/alimta      To help prevent nausea and vomiting after your treatment, we encourage you to take your nausea medication as directed.  BELOW ARE SYMPTOMS THAT SHOULD BE REPORTED IMMEDIATELY: *FEVER GREATER THAN 100.4 F (38 C) OR HIGHER *CHILLS OR SWEATING *NAUSEA AND VOMITING THAT IS NOT CONTROLLED WITH YOUR NAUSEA MEDICATION *UNUSUAL SHORTNESS OF BREATH *UNUSUAL BRUISING OR BLEEDING *URINARY PROBLEMS (pain or burning when urinating, or frequent urination) *BOWEL PROBLEMS (unusual diarrhea, constipation, pain near the anus) TENDERNESS IN MOUTH AND THROAT WITH OR WITHOUT PRESENCE OF ULCERS (sore throat, sores in mouth, or a toothache) UNUSUAL RASH, SWELLING OR PAIN  UNUSUAL VAGINAL DISCHARGE OR ITCHING   Items with * indicate a potential emergency and should be followed up as soon as possible or go to the Emergency Department if any problems should occur.  Please show the CHEMOTHERAPY ALERT CARD or IMMUNOTHERAPY ALERT CARD at  check-in to the Emergency Department and triage nurse.  Should you have questions after your visit or need to cancel or reschedule your appointment, please contact [facility]  Dept: [phone]  and follow the prompts.  Office hours are 8:00 a.m. to 4:30 p.m. Monday - Friday. Please note that voicemails left after 4:00 p.m. may not be returned until the following business day.  We are closed weekends and major holidays. You have access to a nurse at all times for urgent questions. Please call the main number to the clinic Dept: [phone] and follow the prompts.   For any non-urgent questions, you may also contact your provider using MyChart. We now offer e-Visits for anyone 56 and older to request care online for non-urgent symptoms. For details visit [email or web address].   Also download the MyChart app! Go to the app store, search "MyChart", open the app, select [facility], and log in with your MyChart username and password.  Due to Covid, a mask is required upon entering the hospital/clinic. If you do not have a mask, one will be given to you upon arrival. For doctor visits, patients may have 1 support person aged 35 or older with them. For treatment visits, patients cannot have anyone with them due to current Covid guidelines and our immunocompromised population.

## 2021-01-15 ENCOUNTER — Telehealth: Payer: Self-pay

## 2021-01-15 ENCOUNTER — Inpatient Hospital Stay: Payer: BC Managed Care – PPO

## 2021-01-15 ENCOUNTER — Other Ambulatory Visit: Payer: Self-pay

## 2021-01-15 VITALS — BP 118/79 | HR 65 | Temp 98.3°F | Resp 18

## 2021-01-15 DIAGNOSIS — C3491 Malignant neoplasm of unspecified part of right bronchus or lung: Secondary | ICD-10-CM

## 2021-01-15 DIAGNOSIS — D6481 Anemia due to antineoplastic chemotherapy: Secondary | ICD-10-CM | POA: Diagnosis not present

## 2021-01-15 DIAGNOSIS — C3411 Malignant neoplasm of upper lobe, right bronchus or lung: Secondary | ICD-10-CM | POA: Diagnosis not present

## 2021-01-15 DIAGNOSIS — Z79899 Other long term (current) drug therapy: Secondary | ICD-10-CM | POA: Diagnosis not present

## 2021-01-15 DIAGNOSIS — C770 Secondary and unspecified malignant neoplasm of lymph nodes of head, face and neck: Secondary | ICD-10-CM | POA: Diagnosis not present

## 2021-01-15 DIAGNOSIS — Z95828 Presence of other vascular implants and grafts: Secondary | ICD-10-CM

## 2021-01-15 DIAGNOSIS — Z5112 Encounter for antineoplastic immunotherapy: Secondary | ICD-10-CM | POA: Diagnosis not present

## 2021-01-15 DIAGNOSIS — Z5111 Encounter for antineoplastic chemotherapy: Secondary | ICD-10-CM | POA: Diagnosis not present

## 2021-01-15 DIAGNOSIS — T451X5A Adverse effect of antineoplastic and immunosuppressive drugs, initial encounter: Secondary | ICD-10-CM | POA: Diagnosis not present

## 2021-01-15 LAB — CBC WITH DIFFERENTIAL (CANCER CENTER ONLY)
Abs Immature Granulocytes: 0.01 10*3/uL (ref 0.00–0.07)
Basophils Absolute: 0 10*3/uL (ref 0.0–0.1)
Basophils Relative: 0 %
Eosinophils Absolute: 0 10*3/uL (ref 0.0–0.5)
Eosinophils Relative: 1 %
HCT: 20.3 % — ABNORMAL LOW (ref 39.0–52.0)
Hemoglobin: 7.6 g/dL — ABNORMAL LOW (ref 13.0–17.0)
Immature Granulocytes: 0 %
Lymphocytes Relative: 52 %
Lymphs Abs: 1.3 10*3/uL (ref 0.7–4.0)
MCH: 41.1 pg — ABNORMAL HIGH (ref 26.0–34.0)
MCHC: 37.4 g/dL — ABNORMAL HIGH (ref 30.0–36.0)
MCV: 109.7 fL — ABNORMAL HIGH (ref 80.0–100.0)
Monocytes Absolute: 0.2 10*3/uL (ref 0.1–1.0)
Monocytes Relative: 7 %
Neutro Abs: 1 10*3/uL — ABNORMAL LOW (ref 1.7–7.7)
Neutrophils Relative %: 40 %
Platelet Count: 200 10*3/uL (ref 150–400)
RBC: 1.85 MIL/uL — ABNORMAL LOW (ref 4.22–5.81)
RDW: 18.5 % — ABNORMAL HIGH (ref 11.5–15.5)
WBC Count: 2.5 10*3/uL — ABNORMAL LOW (ref 4.0–10.5)
nRBC: 0 % (ref 0.0–0.2)

## 2021-01-15 LAB — CMP (CANCER CENTER ONLY)
ALT: 19 U/L (ref 0–44)
AST: 33 U/L (ref 15–41)
Albumin: 4.1 g/dL (ref 3.5–5.0)
Alkaline Phosphatase: 57 U/L (ref 38–126)
Anion gap: 9 (ref 5–15)
BUN: 10 mg/dL (ref 8–23)
CO2: 27 mmol/L (ref 22–32)
Calcium: 9.2 mg/dL (ref 8.9–10.3)
Chloride: 92 mmol/L — ABNORMAL LOW (ref 98–111)
Creatinine: 0.72 mg/dL (ref 0.61–1.24)
GFR, Estimated: >60 mL/min (ref >60)
Glucose, Bld: 116 mg/dL — ABNORMAL HIGH (ref 70–99)
Potassium: 3.7 mmol/L (ref 3.5–5.1)
Sodium: 128 mmol/L — ABNORMAL LOW (ref 135–145)
Total Bilirubin: 0.8 mg/dL (ref 0.3–1.2)
Total Protein: 7.5 g/dL (ref 6.5–8.1)

## 2021-01-15 LAB — TSH: TSH: 1.353 u[IU]/mL (ref 0.350–4.500)

## 2021-01-15 MED ORDER — SODIUM CHLORIDE 0.9% FLUSH
10.0000 mL | Freq: Once | INTRAVENOUS | Status: AC
  Administered 2021-01-15: 10 mL
  Filled 2021-01-15: qty 10

## 2021-01-15 MED ORDER — HEPARIN SOD (PORK) LOCK FLUSH 100 UNIT/ML IV SOLN
500.0000 [IU] | Freq: Once | INTRAVENOUS | Status: AC
  Administered 2021-01-15: 500 [IU]
  Filled 2021-01-15: qty 5

## 2021-01-15 NOTE — Telephone Encounter (Signed)
Contacted pt per Dr Worthy Flank request: He needs 2 units of PRBCs transfusion in the next few days     Pt to have blood work done 01/16/21 at 12:30 and Blood transfusion on (2 units) 01/17/21. Pt verbalized understanding.

## 2021-01-16 ENCOUNTER — Inpatient Hospital Stay: Payer: BC Managed Care – PPO

## 2021-01-16 ENCOUNTER — Other Ambulatory Visit: Payer: Self-pay

## 2021-01-16 DIAGNOSIS — D6481 Anemia due to antineoplastic chemotherapy: Secondary | ICD-10-CM | POA: Diagnosis not present

## 2021-01-16 DIAGNOSIS — Z5112 Encounter for antineoplastic immunotherapy: Secondary | ICD-10-CM | POA: Diagnosis not present

## 2021-01-16 DIAGNOSIS — T451X5A Adverse effect of antineoplastic and immunosuppressive drugs, initial encounter: Secondary | ICD-10-CM | POA: Diagnosis not present

## 2021-01-16 DIAGNOSIS — Z5111 Encounter for antineoplastic chemotherapy: Secondary | ICD-10-CM | POA: Diagnosis not present

## 2021-01-16 DIAGNOSIS — C3491 Malignant neoplasm of unspecified part of right bronchus or lung: Secondary | ICD-10-CM

## 2021-01-16 DIAGNOSIS — C3411 Malignant neoplasm of upper lobe, right bronchus or lung: Secondary | ICD-10-CM | POA: Diagnosis not present

## 2021-01-16 DIAGNOSIS — Z95828 Presence of other vascular implants and grafts: Secondary | ICD-10-CM

## 2021-01-16 DIAGNOSIS — C770 Secondary and unspecified malignant neoplasm of lymph nodes of head, face and neck: Secondary | ICD-10-CM | POA: Diagnosis not present

## 2021-01-16 DIAGNOSIS — Z79899 Other long term (current) drug therapy: Secondary | ICD-10-CM | POA: Diagnosis not present

## 2021-01-16 LAB — CMP (CANCER CENTER ONLY)
ALT: 17 U/L (ref 0–44)
AST: 29 U/L (ref 15–41)
Albumin: 3.6 g/dL (ref 3.5–5.0)
Alkaline Phosphatase: 61 U/L (ref 38–126)
Anion gap: 10 (ref 5–15)
BUN: 11 mg/dL (ref 8–23)
CO2: 26 mmol/L (ref 22–32)
Calcium: 9.6 mg/dL (ref 8.9–10.3)
Chloride: 93 mmol/L — ABNORMAL LOW (ref 98–111)
Creatinine: 0.78 mg/dL (ref 0.61–1.24)
GFR, Estimated: >60 mL/min (ref >60)
Glucose, Bld: 125 mg/dL — ABNORMAL HIGH (ref 70–99)
Potassium: 3.7 mmol/L (ref 3.5–5.1)
Sodium: 129 mmol/L — ABNORMAL LOW (ref 135–145)
Total Bilirubin: 0.5 mg/dL (ref 0.3–1.2)
Total Protein: 7.1 g/dL (ref 6.5–8.1)

## 2021-01-16 LAB — CBC WITH DIFFERENTIAL (CANCER CENTER ONLY)
Abs Immature Granulocytes: 0.01 10*3/uL (ref 0.00–0.07)
Basophils Absolute: 0 10*3/uL (ref 0.0–0.1)
Basophils Relative: 0 %
Eosinophils Absolute: 0 10*3/uL (ref 0.0–0.5)
Eosinophils Relative: 1 %
HCT: 19.5 % — ABNORMAL LOW (ref 39.0–52.0)
Hemoglobin: 7.2 g/dL — ABNORMAL LOW (ref 13.0–17.0)
Immature Granulocytes: 0 %
Lymphocytes Relative: 48 %
Lymphs Abs: 1.2 10*3/uL (ref 0.7–4.0)
MCH: 40.7 pg — ABNORMAL HIGH (ref 26.0–34.0)
MCHC: 36.9 g/dL — ABNORMAL HIGH (ref 30.0–36.0)
MCV: 110.2 fL — ABNORMAL HIGH (ref 80.0–100.0)
Monocytes Absolute: 0.3 10*3/uL (ref 0.1–1.0)
Monocytes Relative: 11 %
Neutro Abs: 0.9 10*3/uL — ABNORMAL LOW (ref 1.7–7.7)
Neutrophils Relative %: 40 %
Platelet Count: 139 10*3/uL — ABNORMAL LOW (ref 150–400)
RBC: 1.77 MIL/uL — ABNORMAL LOW (ref 4.22–5.81)
RDW: 18.2 % — ABNORMAL HIGH (ref 11.5–15.5)
WBC Count: 2.4 10*3/uL — ABNORMAL LOW (ref 4.0–10.5)
nRBC: 0 % (ref 0.0–0.2)

## 2021-01-16 LAB — PREPARE RBC (CROSSMATCH)

## 2021-01-16 MED ORDER — SODIUM CHLORIDE 0.9% FLUSH
10.0000 mL | Freq: Once | INTRAVENOUS | Status: AC
Start: 2021-01-16 — End: 2021-01-16
  Administered 2021-01-16: 10 mL
  Filled 2021-01-16: qty 10

## 2021-01-16 MED ORDER — HEPARIN SOD (PORK) LOCK FLUSH 100 UNIT/ML IV SOLN
500.0000 [IU] | Freq: Once | INTRAVENOUS | Status: AC
Start: 2021-01-16 — End: 2021-01-16
  Administered 2021-01-16: 500 [IU]
  Filled 2021-01-16: qty 5

## 2021-01-17 ENCOUNTER — Inpatient Hospital Stay: Payer: BC Managed Care – PPO

## 2021-01-17 ENCOUNTER — Other Ambulatory Visit: Payer: Self-pay

## 2021-01-17 VITALS — BP 128/73 | HR 69 | Temp 98.6°F | Resp 18

## 2021-01-17 DIAGNOSIS — Z5112 Encounter for antineoplastic immunotherapy: Secondary | ICD-10-CM | POA: Diagnosis not present

## 2021-01-17 DIAGNOSIS — C770 Secondary and unspecified malignant neoplasm of lymph nodes of head, face and neck: Secondary | ICD-10-CM | POA: Diagnosis not present

## 2021-01-17 DIAGNOSIS — Z79899 Other long term (current) drug therapy: Secondary | ICD-10-CM | POA: Diagnosis not present

## 2021-01-17 DIAGNOSIS — Z95828 Presence of other vascular implants and grafts: Secondary | ICD-10-CM

## 2021-01-17 DIAGNOSIS — D6481 Anemia due to antineoplastic chemotherapy: Secondary | ICD-10-CM | POA: Diagnosis not present

## 2021-01-17 DIAGNOSIS — T451X5A Adverse effect of antineoplastic and immunosuppressive drugs, initial encounter: Secondary | ICD-10-CM | POA: Diagnosis not present

## 2021-01-17 DIAGNOSIS — C3491 Malignant neoplasm of unspecified part of right bronchus or lung: Secondary | ICD-10-CM

## 2021-01-17 DIAGNOSIS — Z5111 Encounter for antineoplastic chemotherapy: Secondary | ICD-10-CM | POA: Diagnosis not present

## 2021-01-17 DIAGNOSIS — C3411 Malignant neoplasm of upper lobe, right bronchus or lung: Secondary | ICD-10-CM | POA: Diagnosis not present

## 2021-01-17 LAB — PREPARE RBC (CROSSMATCH)

## 2021-01-17 MED ORDER — DIPHENHYDRAMINE HCL 25 MG PO CAPS
25.0000 mg | ORAL_CAPSULE | Freq: Once | ORAL | Status: AC
  Administered 2021-01-17: 25 mg via ORAL

## 2021-01-17 MED ORDER — ACETAMINOPHEN 325 MG PO TABS
ORAL_TABLET | ORAL | Status: AC
  Filled 2021-01-17: qty 2

## 2021-01-17 MED ORDER — DIPHENHYDRAMINE HCL 25 MG PO CAPS
ORAL_CAPSULE | ORAL | Status: AC
  Filled 2021-01-17: qty 1

## 2021-01-17 MED ORDER — ACETAMINOPHEN 325 MG PO TABS
650.0000 mg | ORAL_TABLET | Freq: Once | ORAL | Status: AC
  Administered 2021-01-17: 650 mg via ORAL

## 2021-01-17 MED ORDER — SODIUM CHLORIDE 0.9% IV SOLUTION
250.0000 mL | Freq: Once | INTRAVENOUS | Status: AC
  Administered 2021-01-17: 250 mL via INTRAVENOUS
  Filled 2021-01-17: qty 250

## 2021-01-17 MED ORDER — SODIUM CHLORIDE 0.9% FLUSH
10.0000 mL | Freq: Once | INTRAVENOUS | Status: AC
  Administered 2021-01-17: 10 mL
  Filled 2021-01-17: qty 10

## 2021-01-17 MED ORDER — HEPARIN SOD (PORK) LOCK FLUSH 100 UNIT/ML IV SOLN
500.0000 [IU] | Freq: Once | INTRAVENOUS | Status: AC
  Administered 2021-01-17: 500 [IU]
  Filled 2021-01-17: qty 5

## 2021-01-17 NOTE — Progress Notes (Signed)
Patient had episode of chills immediately prior to blood transfusion, oral temp 98.6 & 98.9, patient denies any other complaints.  Blood transfusion started, patient instructed to notify RN of any worsening chills, SOB, itching, back pain, any changes.  Patient verbalizes understanding.   0950 - 15 minute VS temp is 99.3 oral, Dr. Earlie Server informed, RN instructed to continue with blood transfusion, will monitor patient closely.

## 2021-01-17 NOTE — Patient Instructions (Signed)
https://www.[facility].org/donate-blood/blood-donation-process/what-happens-to-donated-blood/blood-transfusions/types-of-blood-transfusions.html"> https://www.[facility].org/donate-blood/blood-donation-process/what-happens-to-donated-blood/blood-transfusions/risks-complications.html">  Blood Transfusion, Adult, Care After This sheet gives you information about how to care for yourself after your procedure. Your health care provider may also give you more specific instructions. If you have problems or questions, contact your health careprovider. What can I expect after the procedure? After the procedure, it is common to have: Bruising and soreness where the IV was inserted. A fever or chills on the day of the procedure. This may be your body's response to the new blood cells received. A headache. Follow these instructions at home: IV insertion site care     Follow instructions from your health care provider about how to take care of your IV insertion site. Make sure you: Wash your hands with soap and water before and after you change your bandage (dressing). If soap and water are not available, use hand sanitizer. Change your dressing as told by your health care provider. Check your IV insertion site every day for signs of infection. Check for: Redness, swelling, or pain. Bleeding from the site. Warmth. Pus or a bad smell. General instructions Take over-the-counter and prescription medicines only as told by your health care provider. Rest as told by your health care provider. Return to your normal activities as told by your health care provider. Keep all follow-up visits as told by your health care provider. This is important. Contact a health care provider if: You have itching or red, swollen areas of skin (hives). You feel anxious. You feel weak after doing your normal activities. You have redness, swelling, warmth, or pain around the IV insertion site. You have blood coming  from the IV insertion site that does not stop with pressure. You have pus or a bad smell coming from your IV insertion site. Get help right away if: You have symptoms of a serious allergic or immune system reaction, including: Trouble breathing or shortness of breath. Swelling of the face or feeling flushed. Fever or chills. Pain in the head, back, or chest. Dark urine or blood in the urine. Widespread rash. Fast heartbeat. Feeling dizzy or light-headed. If you receive your blood transfusion in an outpatient setting, you will betold whom to contact to report any reactions. These symptoms may represent a serious problem that is an emergency. Do not wait to see if the symptoms will go away. Get medical help right away. Call your local emergency services (911 in the [place] Do not drive yourself to the hospital. Summary Bruising and tenderness around the IV insertion site are common. Check your IV insertion site every day for signs of infection. Rest as told by your health care provider. Return to your normal activities as told by your health care provider. Get help right away for symptoms of a serious allergic or immune system reaction to blood transfusion. This information is not intended to replace advice given to you by your health care provider. Make sure you discuss any questions you have with your healthcare provider. Document Revised: 01/07/2019 Document Reviewed: 01/07/2019 Elsevier Patient Education  2022 [employer]  

## 2021-01-18 LAB — TYPE AND SCREEN
ABO/RH(D): A POS
Antibody Screen: NEGATIVE
Unit division: 0
Unit division: 0

## 2021-01-18 LAB — BPAM RBC
Blood Product Expiration Date: 202207082359
Blood Product Expiration Date: 202207082359
ISSUE DATE / TIME: 202206220912
ISSUE DATE / TIME: 202206220912
Unit Type and Rh: 6200
Unit Type and Rh: 6200

## 2021-01-22 ENCOUNTER — Other Ambulatory Visit: Payer: Self-pay

## 2021-01-22 ENCOUNTER — Inpatient Hospital Stay: Payer: BC Managed Care – PPO

## 2021-01-22 DIAGNOSIS — Z79899 Other long term (current) drug therapy: Secondary | ICD-10-CM | POA: Diagnosis not present

## 2021-01-22 DIAGNOSIS — Z5112 Encounter for antineoplastic immunotherapy: Secondary | ICD-10-CM | POA: Diagnosis not present

## 2021-01-22 DIAGNOSIS — Z5111 Encounter for antineoplastic chemotherapy: Secondary | ICD-10-CM | POA: Diagnosis not present

## 2021-01-22 DIAGNOSIS — D6481 Anemia due to antineoplastic chemotherapy: Secondary | ICD-10-CM | POA: Diagnosis not present

## 2021-01-22 DIAGNOSIS — C770 Secondary and unspecified malignant neoplasm of lymph nodes of head, face and neck: Secondary | ICD-10-CM | POA: Diagnosis not present

## 2021-01-22 DIAGNOSIS — C3411 Malignant neoplasm of upper lobe, right bronchus or lung: Secondary | ICD-10-CM | POA: Diagnosis not present

## 2021-01-22 DIAGNOSIS — T451X5A Adverse effect of antineoplastic and immunosuppressive drugs, initial encounter: Secondary | ICD-10-CM | POA: Diagnosis not present

## 2021-01-22 DIAGNOSIS — C3490 Malignant neoplasm of unspecified part of unspecified bronchus or lung: Secondary | ICD-10-CM

## 2021-01-22 DIAGNOSIS — Z95828 Presence of other vascular implants and grafts: Secondary | ICD-10-CM

## 2021-01-22 DIAGNOSIS — C3491 Malignant neoplasm of unspecified part of right bronchus or lung: Secondary | ICD-10-CM

## 2021-01-22 LAB — CBC WITH DIFFERENTIAL (CANCER CENTER ONLY)
Abs Immature Granulocytes: 0.01 10*3/uL (ref 0.00–0.07)
Basophils Absolute: 0 10*3/uL (ref 0.0–0.1)
Basophils Relative: 0 %
Eosinophils Absolute: 0 10*3/uL (ref 0.0–0.5)
Eosinophils Relative: 1 %
HCT: 27.3 % — ABNORMAL LOW (ref 39.0–52.0)
Hemoglobin: 10.2 g/dL — ABNORMAL LOW (ref 13.0–17.0)
Immature Granulocytes: 0 %
Lymphocytes Relative: 47 %
Lymphs Abs: 1.5 10*3/uL (ref 0.7–4.0)
MCH: 37.1 pg — ABNORMAL HIGH (ref 26.0–34.0)
MCHC: 37.4 g/dL — ABNORMAL HIGH (ref 30.0–36.0)
MCV: 99.3 fL (ref 80.0–100.0)
Monocytes Absolute: 0.6 10*3/uL (ref 0.1–1.0)
Monocytes Relative: 19 %
Neutro Abs: 1 10*3/uL — ABNORMAL LOW (ref 1.7–7.7)
Neutrophils Relative %: 33 %
Platelet Count: 77 10*3/uL — ABNORMAL LOW (ref 150–400)
RBC: 2.75 MIL/uL — ABNORMAL LOW (ref 4.22–5.81)
RDW: 20.5 % — ABNORMAL HIGH (ref 11.5–15.5)
WBC Count: 3.2 10*3/uL — ABNORMAL LOW (ref 4.0–10.5)
nRBC: 0 % (ref 0.0–0.2)

## 2021-01-22 LAB — CMP (CANCER CENTER ONLY)
ALT: 20 U/L (ref 0–44)
AST: 31 U/L (ref 15–41)
Albumin: 3.7 g/dL (ref 3.5–5.0)
Alkaline Phosphatase: 64 U/L (ref 38–126)
Anion gap: 11 (ref 5–15)
BUN: 6 mg/dL — ABNORMAL LOW (ref 8–23)
CO2: 24 mmol/L (ref 22–32)
Calcium: 9.2 mg/dL (ref 8.9–10.3)
Chloride: 93 mmol/L — ABNORMAL LOW (ref 98–111)
Creatinine: 0.76 mg/dL (ref 0.61–1.24)
GFR, Estimated: >60 mL/min (ref >60)
Glucose, Bld: 89 mg/dL (ref 70–99)
Potassium: 4 mmol/L (ref 3.5–5.1)
Sodium: 128 mmol/L — ABNORMAL LOW (ref 135–145)
Total Bilirubin: 0.5 mg/dL (ref 0.3–1.2)
Total Protein: 7.3 g/dL (ref 6.5–8.1)

## 2021-01-22 LAB — TSH: TSH: 0.861 u[IU]/mL (ref 0.320–4.118)

## 2021-01-22 MED ORDER — HEPARIN SOD (PORK) LOCK FLUSH 100 UNIT/ML IV SOLN
500.0000 [IU] | Freq: Once | INTRAVENOUS | Status: AC
  Administered 2021-01-22: 500 [IU]
  Filled 2021-01-22: qty 5

## 2021-01-22 MED ORDER — SODIUM CHLORIDE 0.9% FLUSH
10.0000 mL | Freq: Once | INTRAVENOUS | Status: AC
  Administered 2021-01-22: 10 mL
  Filled 2021-01-22: qty 10

## 2021-01-22 NOTE — Patient Instructions (Signed)
Implanted Port Home Guide An implanted port is a device that is placed under the skin. It is usually placed in the chest. The device can be used to give IV medicine, to take blood, or for dialysis. You may have an implanted port if: You need IV medicine that would be irritating to the small veins in your hands or arms. You need IV medicines, such as antibiotics, for a long period of time. You need IV nutrition for a long period of time. You need dialysis. When you have a port, your health care provider can choose to use the port instead of veins in your arms for these procedures. You may have fewer limitations when using a port than you would if you used other types of long-term IVs, and you will likely be able to return to normal activities afteryour incision heals. An implanted port has two main parts: Reservoir. The reservoir is the part where a needle is inserted to give medicines or draw blood. The reservoir is round. After it is placed, it appears as a small, raised area under your skin. Catheter. The catheter is a thin, flexible tube that connects the reservoir to a vein. Medicine that is inserted into the reservoir goes into the catheter and then into the vein. How is my port accessed? To access your port: A numbing cream may be placed on the skin over the port site. Your health care provider will put on a mask and sterile gloves. The skin over your port will be cleaned carefully with a germ-killing soap and allowed to dry. Your health care provider will gently pinch the port and insert a needle into it. Your health care provider will check for a blood return to make sure the port is in the vein and is not clogged. If your port needs to remain accessed to get medicine continuously (constant infusion), your health care provider will place a clear bandage (dressing) over the needle site. The dressing and needle will need to be changed every week, or as told by your health care provider. What  is flushing? Flushing helps keep the port from getting clogged. Follow instructions from your health care provider about how and when to flush the port. Ports are usually flushed with saline solution or a medicine called heparin. The need for flushing will depend on how the port is used: If the port is only used from time to time to give medicines or draw blood, the port may need to be flushed: Before and after medicines have been given. Before and after blood has been drawn. As part of routine maintenance. Flushing may be recommended every 4-6 weeks. If a constant infusion is running, the port may not need to be flushed. Throw away any syringes in a disposal container that is meant for sharp items (sharps container). You can buy a sharps container from a pharmacy, or you can make one by using an empty hard plastic bottle with a cover. How long will my port stay implanted? The port can stay in for as long as your health care provider thinks it is needed. When it is time for the port to come out, a surgery will be done to remove it. The surgery will be similar to the procedure that was done to putthe port in. Follow these instructions at home:  Flush your port as told by your health care provider. If you need an infusion over several days, follow instructions from your health care provider about how to take   care of your port site. Make sure you: Wash your hands with soap and water before you change your dressing. If soap and water are not available, use alcohol-based hand sanitizer. Change your dressing as told by your health care provider. Place any used dressings or infusion bags into a plastic bag. Throw that bag in the trash. Keep the dressing that covers the needle clean and dry. Do not get it wet. Do not use scissors or sharp objects near the tube. Keep the tube clamped, unless it is being used. Check your port site every day for signs of infection. Check for: Redness, swelling, or  pain. Fluid or blood. Pus or a bad smell. Protect the skin around the port site. Avoid wearing bra straps that rub or irritate the site. Protect the skin around your port from seat belts. Place a soft pad over your chest if needed. Bathe or shower as told by your health care provider. The site may get wet as long as you are not actively receiving an infusion. Return to your normal activities as told by your health care provider. Ask your health care provider what activities are safe for you. Carry a medical alert card or wear a medical alert bracelet at all times. This will let health care providers know that you have an implanted port in case of an emergency. Get help right away if: You have redness, swelling, or pain at the port site. You have fluid or blood coming from your port site. You have pus or a bad smell coming from the port site. You have a fever. Summary Implanted ports are usually placed in the chest for long-term IV access. Follow instructions from your health care provider about flushing the port and changing bandages (dressings). Take care of the area around your port by avoiding clothing that puts pressure on the area, and by watching for signs of infection. Protect the skin around your port from seat belts. Place a soft pad over your chest if needed. Get help right away if you have a fever or you have redness, swelling, pain, drainage, or a bad smell at the port site. This information is not intended to replace advice given to you by your health care provider. Make sure you discuss any questions you have with your healthcare provider. Document Revised: 11/29/2019 Document Reviewed: 11/29/2019 Elsevier Patient Education  2022 [employer]  

## 2021-01-30 ENCOUNTER — Other Ambulatory Visit: Payer: Self-pay | Admitting: *Deleted

## 2021-01-30 ENCOUNTER — Inpatient Hospital Stay: Payer: BC Managed Care – PPO

## 2021-01-30 ENCOUNTER — Inpatient Hospital Stay (HOSPITAL_BASED_OUTPATIENT_CLINIC_OR_DEPARTMENT_OTHER): Payer: BC Managed Care – PPO | Admitting: Internal Medicine

## 2021-01-30 ENCOUNTER — Inpatient Hospital Stay: Payer: BC Managed Care – PPO | Attending: Internal Medicine

## 2021-01-30 ENCOUNTER — Other Ambulatory Visit: Payer: Self-pay

## 2021-01-30 VITALS — BP 139/85 | HR 67 | Temp 98.1°F | Resp 19 | Ht 68.5 in | Wt 136.0 lb

## 2021-01-30 DIAGNOSIS — Z5112 Encounter for antineoplastic immunotherapy: Secondary | ICD-10-CM

## 2021-01-30 DIAGNOSIS — C3491 Malignant neoplasm of unspecified part of right bronchus or lung: Secondary | ICD-10-CM

## 2021-01-30 DIAGNOSIS — C3411 Malignant neoplasm of upper lobe, right bronchus or lung: Secondary | ICD-10-CM | POA: Diagnosis not present

## 2021-01-30 DIAGNOSIS — Z5111 Encounter for antineoplastic chemotherapy: Secondary | ICD-10-CM | POA: Diagnosis not present

## 2021-01-30 DIAGNOSIS — C7951 Secondary malignant neoplasm of bone: Secondary | ICD-10-CM | POA: Insufficient documentation

## 2021-01-30 DIAGNOSIS — Z95828 Presence of other vascular implants and grafts: Secondary | ICD-10-CM

## 2021-01-30 DIAGNOSIS — C778 Secondary and unspecified malignant neoplasm of lymph nodes of multiple regions: Secondary | ICD-10-CM | POA: Insufficient documentation

## 2021-01-30 DIAGNOSIS — Z79899 Other long term (current) drug therapy: Secondary | ICD-10-CM | POA: Insufficient documentation

## 2021-01-30 LAB — CBC WITH DIFFERENTIAL (CANCER CENTER ONLY)
Abs Immature Granulocytes: 0.02 10*3/uL (ref 0.00–0.07)
Basophils Absolute: 0 10*3/uL (ref 0.0–0.1)
Basophils Relative: 0 %
Eosinophils Absolute: 0 10*3/uL (ref 0.0–0.5)
Eosinophils Relative: 1 %
HCT: 27 % — ABNORMAL LOW (ref 39.0–52.0)
Hemoglobin: 10 g/dL — ABNORMAL LOW (ref 13.0–17.0)
Immature Granulocytes: 0 %
Lymphocytes Relative: 34 %
Lymphs Abs: 1.5 10*3/uL (ref 0.7–4.0)
MCH: 37.2 pg — ABNORMAL HIGH (ref 26.0–34.0)
MCHC: 37 g/dL — ABNORMAL HIGH (ref 30.0–36.0)
MCV: 100.4 fL — ABNORMAL HIGH (ref 80.0–100.0)
Monocytes Absolute: 0.8 10*3/uL (ref 0.1–1.0)
Monocytes Relative: 19 %
Neutro Abs: 2.1 10*3/uL (ref 1.7–7.7)
Neutrophils Relative %: 46 %
Platelet Count: 221 10*3/uL (ref 150–400)
RBC: 2.69 MIL/uL — ABNORMAL LOW (ref 4.22–5.81)
RDW: 21.7 % — ABNORMAL HIGH (ref 11.5–15.5)
WBC Count: 4.5 10*3/uL (ref 4.0–10.5)
nRBC: 0 % (ref 0.0–0.2)

## 2021-01-30 LAB — CMP (CANCER CENTER ONLY)
ALT: 12 U/L (ref 0–44)
AST: 23 U/L (ref 15–41)
Albumin: 3.8 g/dL (ref 3.5–5.0)
Alkaline Phosphatase: 77 U/L (ref 38–126)
Anion gap: 10 (ref 5–15)
BUN: 6 mg/dL — ABNORMAL LOW (ref 8–23)
CO2: 25 mmol/L (ref 22–32)
Calcium: 9.2 mg/dL (ref 8.9–10.3)
Chloride: 92 mmol/L — ABNORMAL LOW (ref 98–111)
Creatinine: 0.8 mg/dL (ref 0.61–1.24)
GFR, Estimated: >60 mL/min (ref >60)
Glucose, Bld: 102 mg/dL — ABNORMAL HIGH (ref 70–99)
Potassium: 4 mmol/L (ref 3.5–5.1)
Sodium: 127 mmol/L — ABNORMAL LOW (ref 135–145)
Total Bilirubin: 0.4 mg/dL (ref 0.3–1.2)
Total Protein: 7.3 g/dL (ref 6.5–8.1)

## 2021-01-30 LAB — TSH: TSH: 0.908 u[IU]/mL (ref 0.320–4.118)

## 2021-01-30 MED ORDER — SODIUM CHLORIDE 0.9 % IV SOLN
Freq: Once | INTRAVENOUS | Status: AC
  Filled 2021-01-30: qty 250

## 2021-01-30 MED ORDER — SODIUM CHLORIDE 0.9 % IV SOLN
200.0000 mg | Freq: Once | INTRAVENOUS | Status: AC
  Administered 2021-01-30: 200 mg via INTRAVENOUS
  Filled 2021-01-30: qty 8

## 2021-01-30 MED ORDER — HEPARIN SOD (PORK) LOCK FLUSH 100 UNIT/ML IV SOLN
500.0000 [IU] | Freq: Once | INTRAVENOUS | Status: AC | PRN
  Administered 2021-01-30: 500 [IU]
  Filled 2021-01-30: qty 5

## 2021-01-30 MED ORDER — SODIUM CHLORIDE 0.9% FLUSH
10.0000 mL | INTRAVENOUS | Status: DC | PRN
  Administered 2021-01-30: 10 mL
  Filled 2021-01-30: qty 10

## 2021-01-30 MED ORDER — SODIUM CHLORIDE 0.9% FLUSH
10.0000 mL | Freq: Once | INTRAVENOUS | Status: AC
  Administered 2021-01-30: 10 mL
  Filled 2021-01-30: qty 10

## 2021-01-30 MED ORDER — SODIUM CHLORIDE 0.9 % IV SOLN
500.0000 mg/m2 | Freq: Once | INTRAVENOUS | Status: AC
  Administered 2021-01-30: 900 mg via INTRAVENOUS
  Filled 2021-01-30: qty 20

## 2021-01-30 NOTE — Progress Notes (Signed)
[facility] [phone]   [phone]  OFFICE PROGRESS NOTE  Sean Peng, NP [place] [address]  DIAGNOSIS: Metastatic non-small cell lung cancer initially diagnosed as stage IIIA (T3, N1, M0) non-small cell lung cancer, adenocarcinoma with no actionable mutations presented with multiple pulmonary nodules in the right upper lobe as well as metastatic disease in intraparenchymal lymphadenopathy.  This was diagnosed in February 2020.  The patient has disease recurrence in March 2022. PD-L1 expression 20%. He has no actionable mutations by foundation 1.  PRIOR THERAPY: 1) Status post right upper lobectomy with lymph node dissection. 2) Adjuvant systemic chemotherapy with cisplatin 75 mg/M2 and Alimta 500 mg/M2 every 3 weeks.  Status post 4 cycles. 3) palliative radiotherapy to the metastatic bone disease and the scapula as well as the left supraclavicular area under the care of Dr. Isidore Moos  CURRENT THERAPY: Systemic chemotherapy with carboplatin for AUC of 5, Alimta 500 mg/M2 and Keytruda 200 mg IV every 3 weeks.  First dose November 06, 2020.  Status post 4 cycles.  Starting from cycle #5 the patient is treated with maintenance Alimta and Keytruda every 3 weeks  INTERVAL HISTORY: Sean Rose 65 y.o. male returns to the clinic today for follow-up visit.  The patient is feeling fine today with no concerning complaints except for fatigue.  He denied having any current chest pain, shortness of breath, cough or hemoptysis.  He denied having any fever or chills.  He has no nausea, vomiting, diarrhea or constipation.  He has no headache or visual changes.  He continues to tolerate his treatment fairly well except for the fatigue.  He is here today for evaluation before starting cycle #5.  MEDICAL HISTORY: Past Medical History:  Diagnosis Date   Anginal pain ([facility])    CAD (coronary artery disease)    CAP (community acquired  pneumonia) 09/2016   COPD (chronic obstructive pulmonary disease) (HCC)    GERD (gastroesophageal reflux disease)    Heart murmur    "I was told I had one when I was a kid"   Hemorrhoids    History of anal fissures    "no surgeries" (10/30/2016)   Hyperlipidemia    Hypertension    lung ca dx'd 08/2018   Peripheral arterial disease ([facility])    status post right common iliac artery stenting back in 2007   Seasonal allergies    Tobacco abuse     ALLERGIES:  is allergic to compazine [prochlorperazine].  MEDICATIONS:  Current Outpatient Medications  Medication Sig Dispense Refill   amLODipine (NORVASC) 10 MG tablet TAKE 1 TABLET(10 MG) BY MOUTH DAILY (Patient taking differently: Take 10 mg by mouth daily.) 90 tablet 3   aspirin EC 81 MG tablet Take 81 mg by mouth daily.     clopidogrel (PLAVIX) 75 MG tablet TAKE 1 TABLET BY MOUTH EVERY DAY (Patient taking differently: Take 75 mg by mouth daily.) 90 tablet 3   diphenhydrAMINE (BENADRYL) 25 mg capsule Take 25 mg by mouth daily as needed for allergies.  (Patient not taking: Reported on 01/08/2021)     esomeprazole (NEXIUM) 20 MG capsule Take 40 mg by mouth at bedtime.     folic acid (FOLVITE) 1 MG tablet Take 1 tablet (1 mg total) by mouth daily. 30 tablet 4   ibuprofen (ADVIL,MOTRIN) 200 MG tablet Take 400 mg by mouth every 6 (six) hours as needed for headache or moderate pain.  lidocaine-prilocaine (EMLA) cream Apply to the Port-A-Cath site 30-60 minutes before treatment. 30 g 0   Magnesium 250 MG TABS Take 250 mg by mouth daily.     metoprolol succinate (TOPROL-XL) 100 MG 24 hr tablet TAKE 1 TABLET BY MOUTH EVERY DAY 90 tablet 3   Multiple Vitamin (MULTIVITAMIN WITH MINERALS) TABS tablet Take 1 tablet by mouth daily.     nitroGLYCERIN (NITROSTAT) 0.4 MG SL tablet PLACE 1 TABLET UNDER THE TONGUE EVERY 5 MINUTES AS NEEDED FOR CHEST PAIN. (Patient not taking: No sig reported) 25 tablet 6   ondansetron (ZOFRAN) 8 MG tablet Take 1 tablet (8 mg  total) by mouth every 8 (eight) hours as needed for nausea or vomiting. 20 tablet 0   rosuvastatin (CRESTOR) 5 MG tablet Take 1 tablet (5 mg total) by mouth every other day. 45 tablet 3   umeclidinium-vilanterol (ANORO ELLIPTA) 62.5-25 MCG/INH AEPB Inhale 1 puff into the lungs daily. 60 each 5   No current facility-administered medications for this visit.    SURGICAL HISTORY:  Past Surgical History:  Procedure Laterality Date   ANKLE SURGERY Left    "rebuilt it"   ANTERIOR CRUCIATE LIGAMENT REPAIR Right    CARDIAC CATHETERIZATION  09/23/2008   Continued medical therapy - may need GI evaluation in addition.   CARDIAC CATHETERIZATION  10/28/2007   Medical therapy recommended.   CARDIAC CATHETERIZATION  11/18/2006   In-stent restenosis RCA  (50% distal edge, 80% segmental mid, and 50-60% segmental proximal). Successful cutting balloon atherectomy using a [id number] cutting balloon. 3 inflations with atherectomy performed on mid and proximal portions resulting in reduction of 80% mid in-stent restenosis to less than 20% residual and 50-60% segmental proximal to less than 20% residual without dissection.   CARDIAC CATHETERIZATION  02/26/2006   Severe stenosis in RCA. Stenting performed using IVUS. 3.5x20 Maverick balloon deployed at [employer]. Distal stent-a 4x28 Liberte stent-deployed 12atm 48sec, 12atm 31sec, 4atm 19sec. Mid stent-a 4x28 Liberte stent-deployed 14atm 45sec, 14atm 60sec, 14atm 44sec. Proximal stent-4x8 Liberte- 14atm 45sec,14atm 47sec, 16atm 43sec. Severely diseased segment then appeared TIMI-3 flow.   CARDIOVASCULAR STRESS TEST  11/17/2012   No significant ECG changes. Septal perfusion defect is new when complared to study from 2010. Abnormal myocardial perfusion imaging with a basal to mid perfusion suggestive of previous MI.   CAROTID DOPPLER  08/09/2011   Bilateral Bulb/Proximal ICA - demonstrated a mild amount of fibrous plaque without evidence of significant diameter reduction  reduction or other vascular abnormality.   CHEST TUBE INSERTION Right 09/02/2018   Procedure: Chest Tube Insertion;  Surgeon: Garner Nash, DO;  Location: [facility];  Service: Thoracic;  Laterality: Right;   COLONOSCOPY     2003, 2014   CORONARY ANGIOPLASTY WITH STENT PLACEMENT     FEMORAL ARTERY STENT     INGUINAL HERNIA REPAIR Right    IR IMAGING GUIDED PORT INSERTION  11/13/2020   KNEE ARTHROSCOPY Right "multiple"   LAPAROSCOPIC APPENDECTOMY N/A 12/07/2018   Procedure: APPENDECTOMY LAPAROSCOPIC;  Surgeon: Coralie Keens, MD;  Location: [facility];  Service: General;  Laterality: N/A;   LOWER EXTREMITY ARTERIAL DOPPLER  01/31/2011   Bilateral ABIs-normal values with no suggestion of arterial insuff to the lower extremities at rest. Right CIA stent-mild amount of nonhemodynamically significant plaque is noted throughout   [place]   "bone-eating tumor"   PERCUTANEOUS STENT INTERVENTION  04/04/2006 & 04/13/2015   a. Right common iliac artery with an 8.0x18 mm Herculink stent deployed at  12 atm. Stenosis was reduced from 80% to 0% with brisk flow. b. I-cast stenting to left common iliac artery   PERIPHERAL VASCULAR CATHETERIZATION N/A 04/13/2015   Procedure: Lower Extremity Angiography;  Surgeon: Lorretta Harp, MD; L-oCIA 75%, 40-50% L-EIA, R-CIA stent patent, s/p 8 mm x 38 mm ICast covered stent>>0% stenosis in [place] Right    TRANSTHORACIC ECHOCARDIOGRAM  11/26/2012   EF not noted. Aortic valve-sclerosis without stenosis, no regurgiation.    UPPER GASTROINTESTINAL ENDOSCOPY     US CAROTID DOPPLER BILATERAL ([facility] HX)  08/09/2011   Bilateral Bulb/Proximal ICAa demonstrated a mild amount of fibrous plaque without evidence of significant diameter reduction or any other vascular abnormality.   VIDEO ASSISTED THORACOSCOPY (VATS)/ LOBECTOMY Right 09/14/2018   Procedure: RIGHT VIDEO ASSISTED THORACOSCOPY (VATS)/ RIGHT UPPER LOBECTOMY;   Surgeon: Melrose Nakayama, MD;  Location: [facility];  Service: Thoracic;  Laterality: Right;   VIDEO BRONCHOSCOPY WITH ENDOBRONCHIAL NAVIGATION N/A 09/02/2018   Procedure: VIDEO BRONCHOSCOPY WITH ENDOBRONCHIAL NAVIGATION;  Surgeon: Garner Nash, DO;  Location: [facility];  Service: Thoracic;  Laterality: N/A;    REVIEW OF SYSTEMS:  A comprehensive review of systems was negative except for: Constitutional: positive for fatigue   PHYSICAL EXAMINATION: General appearance: alert, cooperative, fatigued, and no distress Head: Normocephalic, without obvious abnormality, atraumatic Neck: no adenopathy, no JVD, supple, symmetrical, trachea midline, and thyroid not enlarged, symmetric, no tenderness/mass/nodules Lymph nodes: Cervical, supraclavicular, and axillary nodes normal. Resp: clear to auscultation bilaterally Back: symmetric, no curvature. ROM normal. No CVA tenderness. Cardio: regular rate and rhythm, S1, S2 normal, no murmur, click, rub or gallop GI: soft, non-tender; bowel sounds normal; no masses,  no organomegaly Extremities: extremities normal, atraumatic, no cyanosis or edema  ECOG PERFORMANCE STATUS: 1 - Symptomatic but completely ambulatory  Blood pressure 139/85, pulse 67, temperature 98.1 F (36.7 C), temperature source Tympanic, resp. rate 19, height 5' 8.5" (1.74 m), weight 136 lb (61.7 kg), SpO2 100 %.  LABORATORY DATA: Lab Results  Component Value Date   WBC 3.2 (L) 01/22/2021   HGB 10.2 (L) 01/22/2021   HCT 27.3 (L) 01/22/2021   MCV 99.3 01/22/2021   PLT 77 (L) 01/22/2021      Chemistry      Component Value Date/Time   NA 128 (L) 01/22/2021 1032   NA 130 (L) 03/05/2017 1110   K 4.0 01/22/2021 1032   CL 93 (L) 01/22/2021 1032   CO2 24 01/22/2021 1032   BUN 6 (L) 01/22/2021 1032   BUN 4 (L) 03/05/2017 1110   CREATININE 0.76 01/22/2021 1032   CREATININE 0.76 04/28/2020 0826      Component Value Date/Time   CALCIUM 9.2 01/22/2021 1032   ALKPHOS 64 01/22/2021  1032   AST 31 01/22/2021 1032   ALT 20 01/22/2021 1032   BILITOT 0.5 01/22/2021 1032       RADIOGRAPHIC STUDIES: CT Chest W Contrast  Result Date: 01/07/2021 CLINICAL DATA:  Non-small cell lung cancer, diagnosed 2020, status post right upper lobectomy, chemotherapy in progress, XRT complete. EXAM: CT CHEST, ABDOMEN, AND PELVIS WITH CONTRAST TECHNIQUE: Multidetector CT imaging of the chest, abdomen and pelvis was performed following the standard protocol during bolus administration of intravenous contrast. CONTRAST:  175m OMNIPAQUE IOHEXOL 300 MG/ML  SOLN COMPARISON:  PET-CT dated 09/17/2020. FINDINGS: CT CHEST FINDINGS Cardiovascular: The heart is normal in size. No pericardial effusion. No evidence of thoracic aortic aneurysm. Atherosclerotic calcifications of  the aortic arch. Coronary atherosclerosis of the LAD and right coronary artery. Right chest port terminates at the cavoatrial junction. Mediastinum/Nodes: No suspicious mediastinal, hilar, or axillary lymphadenopathy. Two residual left supraclavicular/posterior cervical lymph nodes measuring 4-5 mm short axis (series 2/images 1 and 3), previously measuring up to 9 mm short axis. Lungs/Pleura: Status post right upper lobectomy. Moderate centrilobular and paraseptal emphysematous changes, upper lung predominant. Subpleural scarring in the bilateral lower lobes. No suspicious pulmonary nodules. No focal consolidation. No pleural effusion or pneumothorax. Musculoskeletal: Lytic metastasis involving the left lateral scapula (series 2/image 9), extending to the glenoid (series 2/image 5), although this is less conspicuous and favored to be improved. Clavicles, sternum, bilateral ribs, and thoracic spine are intact. Mild degenerative changes of the mid/lower thoracic spine. CT ABDOMEN PELVIS FINDINGS Hepatobiliary: Liver is within normal limits. Gallbladder is unremarkable. No intrahepatic or extrahepatic ductal dilatation. Pancreas: Within normal  limits. Spleen: Normal limits. Adrenals/Urinary Tract: Adrenal glands are within normal limits. Kidneys are within normal limits.  No hydronephrosis. Bladder is within normal limits. Stomach/Bowel: Persistent wall thickening involving the body of the stomach (series 2/image 59), unchanged dating back to December 2019. Relative sparing of the distal gastric antrum. No evidence of bowel obstruction. Prior appendectomy. No colonic wall thickening or inflammatory changes. Vascular/Lymphatic: Fusiform ectasia of the infrarenal abdominal aorta, measuring 2.5 cm. Atherosclerotic calcifications of the abdominal aorta and branch vessels. No suspicious abdominopelvic lymphadenopathy. Reproductive: Prostate is unremarkable. Other: No abdominopelvic ascites. Musculoskeletal: No focal osseous lesions. IMPRESSION: Improving left supraclavicular/posterior cervical nodal metastases, now measuring 4-5 mm short axis. Improving lytic metastasis involving the left lateral scapula. No evidence of new/progressive metastatic disease. Persistent wall thickening involving the body of the stomach, unchanged dating back to December 2019. Correlate with endoscopy as clinically warranted. Electronically Signed   By: Julian Hy M.D.   On: 01/07/2021 22:01   CT Abdomen Pelvis W Contrast  Result Date: 01/07/2021 CLINICAL DATA:  Non-small cell lung cancer, diagnosed 2020, status post right upper lobectomy, chemotherapy in progress, XRT complete. EXAM: CT CHEST, ABDOMEN, AND PELVIS WITH CONTRAST TECHNIQUE: Multidetector CT imaging of the chest, abdomen and pelvis was performed following the standard protocol during bolus administration of intravenous contrast. CONTRAST:  197m OMNIPAQUE IOHEXOL 300 MG/ML  SOLN COMPARISON:  PET-CT dated 09/17/2020. FINDINGS: CT CHEST FINDINGS Cardiovascular: The heart is normal in size. No pericardial effusion. No evidence of thoracic aortic aneurysm. Atherosclerotic calcifications of the aortic arch.  Coronary atherosclerosis of the LAD and right coronary artery. Right chest port terminates at the cavoatrial junction. Mediastinum/Nodes: No suspicious mediastinal, hilar, or axillary lymphadenopathy. Two residual left supraclavicular/posterior cervical lymph nodes measuring 4-5 mm short axis (series 2/images 1 and 3), previously measuring up to 9 mm short axis. Lungs/Pleura: Status post right upper lobectomy. Moderate centrilobular and paraseptal emphysematous changes, upper lung predominant. Subpleural scarring in the bilateral lower lobes. No suspicious pulmonary nodules. No focal consolidation. No pleural effusion or pneumothorax. Musculoskeletal: Lytic metastasis involving the left lateral scapula (series 2/image 9), extending to the glenoid (series 2/image 5), although this is less conspicuous and favored to be improved. Clavicles, sternum, bilateral ribs, and thoracic spine are intact. Mild degenerative changes of the mid/lower thoracic spine. CT ABDOMEN PELVIS FINDINGS Hepatobiliary: Liver is within normal limits. Gallbladder is unremarkable. No intrahepatic or extrahepatic ductal dilatation. Pancreas: Within normal limits. Spleen: Normal limits. Adrenals/Urinary Tract: Adrenal glands are within normal limits. Kidneys are within normal limits.  No hydronephrosis. Bladder is within normal limits. Stomach/Bowel: Persistent wall thickening  involving the body of the stomach (series 2/image 59), unchanged dating back to December 2019. Relative sparing of the distal gastric antrum. No evidence of bowel obstruction. Prior appendectomy. No colonic wall thickening or inflammatory changes. Vascular/Lymphatic: Fusiform ectasia of the infrarenal abdominal aorta, measuring 2.5 cm. Atherosclerotic calcifications of the abdominal aorta and branch vessels. No suspicious abdominopelvic lymphadenopathy. Reproductive: Prostate is unremarkable. Other: No abdominopelvic ascites. Musculoskeletal: No focal osseous lesions.  IMPRESSION: Improving left supraclavicular/posterior cervical nodal metastases, now measuring 4-5 mm short axis. Improving lytic metastasis involving the left lateral scapula. No evidence of new/progressive metastatic disease. Persistent wall thickening involving the body of the stomach, unchanged dating back to December 2019. Correlate with endoscopy as clinically warranted. Electronically Signed   By: Julian Hy M.D.   On: 01/07/2021 22:01    ASSESSMENT AND PLAN: This is a very pleasant 65 years old white male with a stage IIIB non-small cell lung cancer, adenocarcinoma with no actionable mutations status post right upper lobectomy with lymph node dissection under the care of [name]. The patient completed a course of adjuvant treatment with cisplatin and Alimta status post 4 cycles.  He tolerated the previous 4 cycles of his treatment fairly well. The patient has been in observation but the recent imaging studies showed suspicious bone lesion in the left shoulder area. I ordered a PET scan which was performed on 09/25/2020 and it showed metastatic disease involving the left scapula and left supraclavicular lymph nodes. The patient had ultrasound-guided core biopsy of the left supraclavicular lymph node and it was positive for metastatic carcinoma. He underwent palliative radiotherapy to the metastatic bone lesion in the left scapula and he is feeling much better. PD-L1 expression 20%. He was tested in the past for molecular study by foundation 1 and it was reported to be negative for actionable mutations. The patient started systemic chemotherapy with carboplatin for AUC of 5, Alimta 500 Mg/M2 and Keytruda 200 Mg IV every 3 weeks status post 4 cycles.  Starting from cycle #5 he is treated with maintenance Alimta and Keytruda every 3 weeks. The patient continues to tolerate his treatment fairly well with no concerning adverse effect except for fatigue. I recommended for him to proceed with  cycle #5 today with maintenance Alimta and Keytruda every 3 weeks. He will come back for follow-up visit in 3 weeks for evaluation before the next cycle of his treatment. The patient was advised to call immediately if he has any other concerning symptoms in the interval.  The patient voices understanding of current disease status and treatment options and is in agreement with the current care plan. All questions were answered. The patient knows to call the clinic with any problems, questions or concerns. We can certainly see the patient much sooner if necessary.  Disclaimer: This note was dictated with voice recognition software. Similar sounding words can inadvertently be transcribed and may not be corrected upon review.

## 2021-01-30 NOTE — Patient Instructions (Addendum)
[facility]  Discharge Instructions: Thank you for choosing [facility] to provide your oncology and hematology care.   If you have a lab appointment with the [facility], please go directly to the [facility] and check in at the registration area.   Wear comfortable clothing and clothing appropriate for easy access to any Portacath or PICC line.   We strive to give you quality time with your provider. You may need to reschedule your appointment if you arrive late (15 or more minutes).  Arriving late affects you and other patients whose appointments are after yours.  Also, if you miss three or more appointments without notifying the office, you may be dismissed from the clinic at the provider's discretion.      For prescription refill requests, have your pharmacy contact our office and allow 72 hours for refills to be completed.    Today you received the following chemotherapy and/or immunotherapy agents : Keytruda, Alimta   To help prevent nausea and vomiting after your treatment, we encourage you to take your nausea medication as directed.  BELOW ARE SYMPTOMS THAT SHOULD BE REPORTED IMMEDIATELY: *FEVER GREATER THAN 100.4 F (38 C) OR HIGHER *CHILLS OR SWEATING *NAUSEA AND VOMITING THAT IS NOT CONTROLLED WITH YOUR NAUSEA MEDICATION *UNUSUAL SHORTNESS OF BREATH *UNUSUAL BRUISING OR BLEEDING *URINARY PROBLEMS (pain or burning when urinating, or frequent urination) *BOWEL PROBLEMS (unusual diarrhea, constipation, pain near the anus) TENDERNESS IN MOUTH AND THROAT WITH OR WITHOUT PRESENCE OF ULCERS (sore throat, sores in mouth, or a toothache) UNUSUAL RASH, SWELLING OR PAIN  UNUSUAL VAGINAL DISCHARGE OR ITCHING   Items with * indicate a potential emergency and should be followed up as soon as possible or go to the Emergency Department if any problems should occur.  Please show the CHEMOTHERAPY ALERT CARD or IMMUNOTHERAPY ALERT CARD at  check-in to the Emergency Department and triage nurse.  Should you have questions after your visit or need to cancel or reschedule your appointment, please contact [facility]  Dept: [phone]  and follow the prompts.  Office hours are 8:00 a.m. to 4:30 p.m. Monday - Friday. Please note that voicemails left after 4:00 p.m. may not be returned until the following business day.  We are closed weekends and major holidays. You have access to a nurse at all times for urgent questions. Please call the main number to the clinic Dept: [phone] and follow the prompts.   For any non-urgent questions, you may also contact your provider using MyChart. We now offer e-Visits for anyone 24 and older to request care online for non-urgent symptoms. For details visit [email or web address].   Also download the MyChart app! Go to the app store, search "MyChart", open the app, select [facility], and log in with your MyChart username and password.  Due to Covid, a mask is required upon entering the hospital/clinic. If you do not have a mask, one will be given to you upon arrival. For doctor visits, patients may have 1 support person aged 65 or older with them. For treatment visits, patients cannot have anyone with them due to current Covid guidelines and our immunocompromised population.  [phone]

## 2021-02-05 ENCOUNTER — Other Ambulatory Visit: Payer: BC Managed Care – PPO

## 2021-02-06 ENCOUNTER — Telehealth: Payer: Self-pay

## 2021-02-06 NOTE — Telephone Encounter (Signed)
Pt LM stating he is having flu like sx. No appetite and temp 99.1-100.4. Pt states he tested negative for COVID 2 days ago.   Per Dr. [name], monitor, hydrate and if sx worsen, go to ER.  I have called the pt back and advised as indicated. Pt denies SOB and states just feels unusually tired. I reiterated Dr. [name] recommendation and encouraged the pt to retest for COVID as well. Pt expressed understanding of this information.

## 2021-02-12 ENCOUNTER — Other Ambulatory Visit: Payer: BC Managed Care – PPO

## 2021-02-14 NOTE — Progress Notes (Signed)
[facility] OFFICE PROGRESS NOTE  [name], NP [place] [address]  DIAGNOSIS: Metastatic non-small cell lung cancer initially diagnosed as stage IIIA (T3, N1, M0) non-small cell lung cancer, adenocarcinoma with no actionable mutations presented with multiple pulmonary nodules in the right upper lobe as well as metastatic disease in intraparenchymal lymphadenopathy.  This was diagnosed in February 2020.  The patient has disease recurrence in March 2022. PD-L1 expression 20%. He has no actionable mutations by foundation 1.  PRIOR THERAPY:  1) Status post right upper lobectomy with lymph node dissection. 2) Adjuvant systemic chemotherapy with cisplatin 75 mg/M2 and Alimta 500 mg/M2 every 3 weeks.  Status post 4 cycles. 3) palliative radiotherapy to the metastatic bone disease and the scapula as well as the left supraclavicular area under the care of Dr. Isidore Moos  CURRENT THERAPY: Systemic chemotherapy with carboplatin for AUC of 5, Alimta 500 mg/M2 and Keytruda 200 mg IV every 3 weeks.  First dose November 06, 2020.  Status post 5 cycles.  Starting from cycle #5, the patient will be treated with maintenance Keytruda and Alimta IV every 3 weeks.  INTERVAL HISTORY: Sean Rose 65 y.o. male returns to the clinic today for a follow-up visit.  The patient is feeling fairly well today without any concerning complaints except in the interval since his last appointment, the patient developed flulike symptoms with a low-grade fever and body aches. He denied associated loss of taste/smell, nasal congestion, sore throat, significant changes in his breathing, skin infections, or dysuria. He reports his symptoms lasted 2-4 days. He reports the highest temperature he had was 100.4 on a few occasions. Him and his wife tested negative for covid but despite this, he took the normal isolation precautions. His symptoms resolved but he continues to have lingering fatigue.     He is currently now on maintenance treatment. He did not notice an appreciable change in his energy or side effect profile.  He denies any night sweats or chills. He lost about 2 lbs and states his appetite is "not what it used to be". He does periodically drink ensure. He has a similar nagging cough which is unchanged. He believes his breathing is also similar except he feels like he has slightly less lung capacity when he takes a deep breath. Denies chest pain or hemoptysis. He denies any nausea or vomiting. He has some constipation after treatment for which he takes a laxative which causes diarrhea.  He denies any headache or visual changes.  The patient is here today for evaluation before starting cycle #6     MEDICAL HISTORY: Past Medical History:  Diagnosis Date   Anginal pain ([place])    CAD (coronary artery disease)    CAP (community acquired pneumonia) 09/2016   COPD (chronic obstructive pulmonary disease) (HCC)    GERD (gastroesophageal reflux disease)    Heart murmur    "I was told I had one when I was a kid"   Hemorrhoids    History of anal fissures    "no surgeries" (10/30/2016)   Hyperlipidemia    Hypertension    lung ca dx'd 08/2018   Peripheral arterial disease ([facility])    status post right common iliac artery stenting back in 2007   Seasonal allergies    Tobacco abuse     ALLERGIES:  is allergic to compazine [prochlorperazine].  MEDICATIONS:  Current Outpatient Medications  Medication Sig Dispense Refill   amLODipine (NORVASC) 10 MG tablet TAKE 1  TABLET(10 MG) BY MOUTH DAILY (Patient taking differently: Take 10 mg by mouth daily.) 90 tablet 3   aspirin EC 81 MG tablet Take 81 mg by mouth daily.     clopidogrel (PLAVIX) 75 MG tablet TAKE 1 TABLET BY MOUTH EVERY DAY (Patient taking differently: Take 75 mg by mouth daily.) 90 tablet 3   diphenhydrAMINE (BENADRYL) 25 mg capsule Take 25 mg by mouth daily as needed for allergies.  (Patient not taking: Reported on 01/08/2021)      esomeprazole (NEXIUM) 20 MG capsule Take 40 mg by mouth at bedtime.     folic acid (FOLVITE) 1 MG tablet Take 1 tablet (1 mg total) by mouth daily. 30 tablet 4   ibuprofen (ADVIL,MOTRIN) 200 MG tablet Take 400 mg by mouth every 6 (six) hours as needed for headache or moderate pain.      lidocaine-prilocaine (EMLA) cream Apply to the Port-A-Cath site 30-60 minutes before treatment. 30 g 0   Magnesium 250 MG TABS Take 250 mg by mouth daily.     metoprolol succinate (TOPROL-XL) 100 MG 24 hr tablet TAKE 1 TABLET BY MOUTH EVERY DAY 90 tablet 3   Multiple Vitamin (MULTIVITAMIN WITH MINERALS) TABS tablet Take 1 tablet by mouth daily.     nitroGLYCERIN (NITROSTAT) 0.4 MG SL tablet PLACE 1 TABLET UNDER THE TONGUE EVERY 5 MINUTES AS NEEDED FOR CHEST PAIN. (Patient not taking: No sig reported) 25 tablet 6   ondansetron (ZOFRAN) 8 MG tablet Take 1 tablet (8 mg total) by mouth every 8 (eight) hours as needed for nausea or vomiting. 20 tablet 0   rosuvastatin (CRESTOR) 5 MG tablet Take 1 tablet (5 mg total) by mouth every other day. 45 tablet 3   umeclidinium-vilanterol (ANORO ELLIPTA) 62.5-25 MCG/INH AEPB Inhale 1 puff into the lungs daily. 60 each 5   No current facility-administered medications for this visit.   Facility-Administered Medications Ordered in Other Visits  Medication Dose Route Frequency Provider Last Rate Last Admin   0.9 %  sodium chloride infusion   Intravenous Once [name], MD       cyanocobalamin ((VITAMIN B-12)) injection 1,000 mcg  1,000 mcg Intramuscular Once [name], MD       heparin lock flush 100 unit/mL  500 Units Intracatheter Once PRN [name], MD       pembrolizumab [facility]) 200 mg in sodium chloride 0.9 % 50 mL chemo infusion  200 mg Intravenous Once [name], MD       PEMEtrexed (ALIMTA) 900 mg in sodium chloride 0.9 % 100 mL chemo infusion  500 mg/m2 (Treatment Plan Recorded) Intravenous Once [name], MD       prochlorperazine  (COMPAZINE) tablet 10 mg  10 mg Oral Once [name], MD       sodium chloride flush (NS) 0.9 % injection 10 mL  10 mL Intracatheter PRN [name], MD        SURGICAL HISTORY:  Past Surgical History:  Procedure Laterality Date   ANKLE SURGERY Left    "rebuilt it"   ANTERIOR CRUCIATE LIGAMENT REPAIR Right    CARDIAC CATHETERIZATION  09/23/2008   Continued medical therapy - may need GI evaluation in addition.   CARDIAC CATHETERIZATION  10/28/2007   Medical therapy recommended.   CARDIAC CATHETERIZATION  11/18/2006   In-stent restenosis RCA  (50% distal edge, 80% segmental mid, and 50-60% segmental proximal). Successful cutting balloon atherectomy using a [id number] cutting balloon. 3 inflations with atherectomy performed on mid and proximal portions  resulting in reduction of 80% mid in-stent restenosis to less than 20% residual and 50-60% segmental proximal to less than 20% residual without dissection.   CARDIAC CATHETERIZATION  02/26/2006   Severe stenosis in RCA. Stenting performed using IVUS. 3.5x20 Maverick balloon deployed at [employer]. Distal stent-a 4x28 Liberte stent-deployed 12atm 48sec, 12atm 31sec, 4atm 19sec. Mid stent-a 4x28 Liberte stent-deployed 14atm 45sec, 14atm 60sec, 14atm 44sec. Proximal stent-4x8 Liberte- 14atm 45sec,14atm 47sec, 16atm 43sec. Severely diseased segment then appeared TIMI-3 flow.   CARDIOVASCULAR STRESS TEST  11/17/2012   No significant ECG changes. Septal perfusion defect is new when complared to study from 2010. Abnormal myocardial perfusion imaging with a basal to mid perfusion suggestive of previous MI.   CAROTID DOPPLER  08/09/2011   Bilateral Bulb/Proximal ICA - demonstrated a mild amount of fibrous plaque without evidence of significant diameter reduction reduction or other vascular abnormality.   CHEST TUBE INSERTION Right 09/02/2018   Procedure: Chest Tube Insertion;  Surgeon: Garner Nash, DO;  Location: [facility];  Service: Thoracic;  Laterality:  Right;   COLONOSCOPY     2003, 2014   CORONARY ANGIOPLASTY WITH STENT PLACEMENT     FEMORAL ARTERY STENT     INGUINAL HERNIA REPAIR Right    IR IMAGING GUIDED PORT INSERTION  11/13/2020   KNEE ARTHROSCOPY Right "multiple"   LAPAROSCOPIC APPENDECTOMY N/A 12/07/2018   Procedure: APPENDECTOMY LAPAROSCOPIC;  Surgeon: Coralie Keens, MD;  Location: [facility];  Service: General;  Laterality: N/A;   LOWER EXTREMITY ARTERIAL DOPPLER  01/31/2011   Bilateral ABIs-normal values with no suggestion of arterial insuff to the lower extremities at rest. Right CIA stent-mild amount of nonhemodynamically significant plaque is noted throughout   [place]   "bone-eating tumor"   PERCUTANEOUS STENT INTERVENTION  04/04/2006 & 04/13/2015   a. Right common iliac artery with an 8.0x18 mm Herculink stent deployed at 12 atm. Stenosis was reduced from 80% to 0% with brisk flow. b. I-cast stenting to left common iliac artery   PERIPHERAL VASCULAR CATHETERIZATION N/A 04/13/2015   Procedure: Lower Extremity Angiography;  Surgeon: Lorretta Harp, MD; L-oCIA 75%, 40-50% L-EIA, R-CIA stent patent, s/p 8 mm x 38 mm ICast covered stent>>0% stenosis in [place] Right    TRANSTHORACIC ECHOCARDIOGRAM  11/26/2012   EF not noted. Aortic valve-sclerosis without stenosis, no regurgiation.    UPPER GASTROINTESTINAL ENDOSCOPY     US CAROTID DOPPLER BILATERAL ([facility])  08/09/2011   Bilateral Bulb/Proximal ICAa demonstrated a mild amount of fibrous plaque without evidence of significant diameter reduction or any other vascular abnormality.   VIDEO ASSISTED THORACOSCOPY (VATS)/ LOBECTOMY Right 09/14/2018   Procedure: RIGHT VIDEO ASSISTED THORACOSCOPY (VATS)/ RIGHT UPPER LOBECTOMY;  Surgeon: Melrose Nakayama, MD;  Location: [facility];  Service: Thoracic;  Laterality: Right;   VIDEO BRONCHOSCOPY WITH ENDOBRONCHIAL NAVIGATION N/A 09/02/2018   Procedure: VIDEO BRONCHOSCOPY WITH  ENDOBRONCHIAL NAVIGATION;  Surgeon: Garner Nash, DO;  Location: [facility];  Service: Thoracic;  Laterality: N/A;    REVIEW OF SYSTEMS:   Review of Systems  Constitutional: Positive for fevers which have since resolved. Positive for fatigue and decreased appetite. Positive for 2 lb weight loss. Negative for chills and fever. HENT: Negative for mouth sores, nosebleeds, sore throat and trouble swallowing.   Eyes: Negative for eye problems and icterus.  Respiratory: Positive for chronic cough and dyspnea on exertion. Negative for hemoptysis and wheezing.   Cardiovascular: Negative  for chest pain and leg swelling.  Gastrointestinal: Positive for intermittent diarrhea and constipation. Negative for abdominal pain, nausea and vomiting.  Genitourinary: Negative for bladder incontinence, difficulty urinating, dysuria, frequency and hematuria.   Musculoskeletal: Negative for back pain, gait problem, neck pain and neck stiffness.  Skin: Negative for itching and rash.  Neurological: Negative for dizziness, extremity weakness, gait problem, headaches, light-headedness and seizures.  Hematological: Negative for adenopathy. Does not bruise/bleed easily.  Psychiatric/Behavioral: Negative for confusion, depression and sleep disturbance. The patient is not nervous/anxious.     PHYSICAL EXAMINATION:  Blood pressure 122/83, pulse 62, temperature (!) 97.1 F (36.2 C), temperature source Temporal, resp. rate 18, height 5' 8.5" (1.74 m), weight 134 lb 6.4 oz (61 kg), SpO2 100 %.  ECOG PERFORMANCE STATUS: 1  Physical Exam  Constitutional: Oriented to person, place, and time and well-developed, well-nourished, and in no distress. HENT: Head: Normocephalic and atraumatic. Mouth/Throat: Oropharynx is clear and moist. No oropharyngeal exudate. Eyes: Conjunctivae are normal. Right eye exhibits no discharge. Left eye exhibits no discharge. No scleral icterus. Neck: Normal range of motion. Neck  supple. Cardiovascular: Normal rate, regular rhythm, normal heart sounds and intact distal pulses.   Pulmonary/Chest: Effort normal and breath sounds normal. No respiratory distress. No wheezes. No rales. Abdominal: Soft. Bowel sounds are normal. Exhibits no distension and no mass. There is no tenderness.  Musculoskeletal: Normal range of motion. Exhibits no edema.  Lymphadenopathy:    No cervical adenopathy.  Neurological: Alert and oriented to person, place, and time. Exhibits normal muscle tone. Gait normal. Coordination normal. Skin: Skin is warm and dry. No rash noted. Not diaphoretic. No erythema. No pallor.  Psychiatric: Mood, memory and judgment normal. Vitals reviewed.  LABORATORY DATA: Lab Results  Component Value Date   WBC 6.3 02/19/2021   HGB 9.7 (L) 02/19/2021   HCT 26.1 (L) 02/19/2021   MCV 103.6 (H) 02/19/2021   PLT 544 (H) 02/19/2021      Chemistry      Component Value Date/Time   NA 127 (L) 02/19/2021 1041   NA 130 (L) 03/05/2017 1110   K 4.2 02/19/2021 1041   CL 92 (L) 02/19/2021 1041   CO2 23 02/19/2021 1041   BUN 5 (L) 02/19/2021 1041   BUN 4 (L) 03/05/2017 1110   CREATININE 0.82 02/19/2021 1041   CREATININE 0.76 04/28/2020 0826      Component Value Date/Time   CALCIUM 9.0 02/19/2021 1041   ALKPHOS 90 02/19/2021 1041   AST 31 02/19/2021 1041   ALT 23 02/19/2021 1041   BILITOT 0.5 02/19/2021 1041       RADIOGRAPHIC STUDIES:  No results found.   ASSESSMENT/PLAN:  This is a very pleasant 65 year old Caucasian male who was initially diagnosed with stage IIIB non-small cell lung cancer, adenocarcinoma. He is status post a right upper lobectomy with lymph node dissection under the care of [name].  He was negative for any actionable mutations.  He completed adjuvant chemotherapy with cisplatin and Alimta for 4 cycles.  He tolerated this fairly well.  His PD-L1 expression is 20%.    The patient had been on observation until imaging studies in  February 2022 showed suspicious bone lesions in the left shoulder area.  A PET scan was performed on 09/25/2020 which showed metastatic disease involving the left scapula and left supraclavicular lymph nodes. The patient had a ultrasound-guided biopsy of the left supraclavicular lymph node and it was positive for metastatic carcinoma.   The patient  then underwent palliative radiotherapy to the metastatic bone lesion in the left scapula under the care of Dr. Isidore Moos.   The patient is currently undergoing systemic chemotherapy with carboplatin for an AUC of 5, Alimta 500 mg per metered squared, Keytruda 20 mg IV every 3 weeks.  The patient is status post 5 cycles.  Starting from cycle #5, the patient started maintenance Alimta and Keytruda.   The patient was seen with Dr. [name] today.  Labs reviewed. Recommend that he proceed with cycle #6 today's schedule.  We will arrange for restaging CT scan the chest, abdomen, and pelvis prior to starting his next cycle of treatment in 3 weeks.   We will see him back for follow-up visit in 3 weeks for evaluation and to review his scan results.  Discussed if he has diarrhea after taking a laxative, to try just taking 1/2 of the bottle.   The patient's fevers have resolved and he is feeling at baseline today except for fatigue. Advised to call us if he develops a fever in the interval or any concerning symptoms.   The patient was advised to call immediately if he has any concerning symptoms in the interval. The patient voices understanding of current disease status and treatment options and is in agreement with the current care plan. All questions were answered. The patient knows to call the clinic with any problems, questions or concerns. We can certainly see the patient much sooner if necessary           Orders Placed This Encounter  Procedures   CT Chest W Contrast    Standing Status:   Future    Standing Expiration Date:   02/19/2022    Order  Specific Question:   If indicated for the ordered procedure, I authorize the administration of contrast media per Radiology protocol    Answer:   Yes    Order Specific Question:   Preferred imaging location?    Answer:   [facility]   CT Abdomen Pelvis W Contrast    Standing Status:   Future    Standing Expiration Date:   02/19/2022    Order Specific Question:   If indicated for the ordered procedure, I authorize the administration of contrast media per Radiology protocol    Answer:   Yes    Order Specific Question:   Preferred imaging location?    Answer:   [facility]    Order Specific Question:   Is Oral Contrast requested for this exam?    Answer:   Yes, Per Radiology protocol     Sean Rose L Teruo Stilley, PA-C 02/19/21  ADDENDUM: Hematology/Oncology Attending: I had a face-to-face encounter with the patient today.  I reviewed his record, lab and recommended his care plan.  This is a very pleasant 65 years old white male with metastatic non-small cell lung cancer, adenocarcinoma that was initially diagnosed as stage IIIb in February 2020 with PD-L1 expression of 20% and no actionable mutations.  The patient underwent right upper lobectomy with lymph node dissection followed by adjuvant systemic chemotherapy with cisplatin and Alimta for 4 cycles.  He also underwent palliative radiotherapy to the recurrent disease in the left scapula and left supraclavicular lymph node when he has disease recurrence in March 2022. He is currently undergoing systemic chemotherapy with carboplatin, Alimta and Keytruda for 4 cycles and currently on the maintenance treatment with Alimta and Keytruda every 3 weeks status post 1 cycle of the maintenance regimen. He has been  tolerating his treatment well except for fatigue. I recommended for the patient to proceed with cycle #6 of his treatment today as planned. He was encouraged to increase his exercise slowly for reconditioning. He will come  back for follow-up visit in 3 weeks for evaluation with repeat CT scan of the chest, abdomen and pelvis for restaging of his disease. The patient was advised to call immediately if he has any other concerning symptoms in the interval.  The total time spent in the appointment was 20 minutes. Disclaimer: This note was dictated with voice recognition software. Similar sounding words can inadvertently be transcribed and may be missed upon review. Eilleen Kempf, MD 02/19/21

## 2021-02-19 ENCOUNTER — Other Ambulatory Visit: Payer: Self-pay

## 2021-02-19 ENCOUNTER — Inpatient Hospital Stay: Payer: BC Managed Care – PPO

## 2021-02-19 ENCOUNTER — Inpatient Hospital Stay (HOSPITAL_BASED_OUTPATIENT_CLINIC_OR_DEPARTMENT_OTHER): Payer: BC Managed Care – PPO | Admitting: Physician Assistant

## 2021-02-19 VITALS — BP 122/83 | HR 62 | Temp 97.1°F | Resp 18 | Ht 68.5 in | Wt 134.4 lb

## 2021-02-19 DIAGNOSIS — Z5111 Encounter for antineoplastic chemotherapy: Secondary | ICD-10-CM

## 2021-02-19 DIAGNOSIS — Z5112 Encounter for antineoplastic immunotherapy: Secondary | ICD-10-CM | POA: Diagnosis not present

## 2021-02-19 DIAGNOSIS — C7951 Secondary malignant neoplasm of bone: Secondary | ICD-10-CM | POA: Diagnosis not present

## 2021-02-19 DIAGNOSIS — Z95828 Presence of other vascular implants and grafts: Secondary | ICD-10-CM

## 2021-02-19 DIAGNOSIS — C3491 Malignant neoplasm of unspecified part of right bronchus or lung: Secondary | ICD-10-CM

## 2021-02-19 DIAGNOSIS — Z79899 Other long term (current) drug therapy: Secondary | ICD-10-CM | POA: Diagnosis not present

## 2021-02-19 DIAGNOSIS — C778 Secondary and unspecified malignant neoplasm of lymph nodes of multiple regions: Secondary | ICD-10-CM | POA: Diagnosis not present

## 2021-02-19 DIAGNOSIS — C3411 Malignant neoplasm of upper lobe, right bronchus or lung: Secondary | ICD-10-CM | POA: Diagnosis not present

## 2021-02-19 LAB — CBC WITH DIFFERENTIAL (CANCER CENTER ONLY)
Abs Immature Granulocytes: 0.02 10*3/uL (ref 0.00–0.07)
Basophils Absolute: 0 10*3/uL (ref 0.0–0.1)
Basophils Relative: 0 %
Eosinophils Absolute: 0.1 10*3/uL (ref 0.0–0.5)
Eosinophils Relative: 1 %
HCT: 26.1 % — ABNORMAL LOW (ref 39.0–52.0)
Hemoglobin: 9.7 g/dL — ABNORMAL LOW (ref 13.0–17.0)
Immature Granulocytes: 0 %
Lymphocytes Relative: 26 %
Lymphs Abs: 1.6 10*3/uL (ref 0.7–4.0)
MCH: 38.5 pg — ABNORMAL HIGH (ref 26.0–34.0)
MCHC: 37.2 g/dL — ABNORMAL HIGH (ref 30.0–36.0)
MCV: 103.6 fL — ABNORMAL HIGH (ref 80.0–100.0)
Monocytes Absolute: 1.1 10*3/uL — ABNORMAL HIGH (ref 0.1–1.0)
Monocytes Relative: 17 %
Neutro Abs: 3.5 10*3/uL (ref 1.7–7.7)
Neutrophils Relative %: 56 %
Platelet Count: 544 10*3/uL — ABNORMAL HIGH (ref 150–400)
RBC: 2.52 MIL/uL — ABNORMAL LOW (ref 4.22–5.81)
WBC Count: 6.3 10*3/uL (ref 4.0–10.5)
nRBC: 0 % (ref 0.0–0.2)

## 2021-02-19 LAB — CMP (CANCER CENTER ONLY)
ALT: 23 U/L (ref 0–44)
AST: 31 U/L (ref 15–41)
Albumin: 3.6 g/dL (ref 3.5–5.0)
Alkaline Phosphatase: 90 U/L (ref 38–126)
Anion gap: 12 (ref 5–15)
BUN: 5 mg/dL — ABNORMAL LOW (ref 8–23)
CO2: 23 mmol/L (ref 22–32)
Calcium: 9 mg/dL (ref 8.9–10.3)
Chloride: 92 mmol/L — ABNORMAL LOW (ref 98–111)
Creatinine: 0.82 mg/dL (ref 0.61–1.24)
GFR, Estimated: >60 mL/min (ref >60)
Glucose, Bld: 94 mg/dL (ref 70–99)
Potassium: 4.2 mmol/L (ref 3.5–5.1)
Sodium: 127 mmol/L — ABNORMAL LOW (ref 135–145)
Total Bilirubin: 0.5 mg/dL (ref 0.3–1.2)
Total Protein: 7.2 g/dL (ref 6.5–8.1)

## 2021-02-19 LAB — TSH: TSH: 0.986 u[IU]/mL (ref 0.320–4.118)

## 2021-02-19 MED ORDER — ONDANSETRON HCL 4 MG/2ML IJ SOLN
INTRAMUSCULAR | Status: AC
  Filled 2021-02-19: qty 4

## 2021-02-19 MED ORDER — SODIUM CHLORIDE 0.9% FLUSH
10.0000 mL | INTRAVENOUS | Status: DC | PRN
  Administered 2021-02-19: 10 mL
  Filled 2021-02-19: qty 10

## 2021-02-19 MED ORDER — CYANOCOBALAMIN 1000 MCG/ML IJ SOLN
INTRAMUSCULAR | Status: AC
  Filled 2021-02-19: qty 1

## 2021-02-19 MED ORDER — SODIUM CHLORIDE 0.9 % IV SOLN
Freq: Once | INTRAVENOUS | Status: AC
  Filled 2021-02-19: qty 250

## 2021-02-19 MED ORDER — PROCHLORPERAZINE MALEATE 10 MG PO TABS: 10.0000 mg | ORAL_TABLET | Freq: Once | ORAL | Status: DC

## 2021-02-19 MED ORDER — SODIUM CHLORIDE 0.9% FLUSH
10.0000 mL | Freq: Once | INTRAVENOUS | Status: AC
  Administered 2021-02-19: 10 mL
  Filled 2021-02-19: qty 10

## 2021-02-19 MED ORDER — SODIUM CHLORIDE 0.9 % IV SOLN
500.0000 mg/m2 | Freq: Once | INTRAVENOUS | Status: AC
  Administered 2021-02-19: 900 mg via INTRAVENOUS
  Filled 2021-02-19: qty 20

## 2021-02-19 MED ORDER — CYANOCOBALAMIN 1000 MCG/ML IJ SOLN
1000.0000 ug | Freq: Once | INTRAMUSCULAR | Status: AC
  Administered 2021-02-19: 1000 ug via INTRAMUSCULAR

## 2021-02-19 MED ORDER — HEPARIN SOD (PORK) LOCK FLUSH 100 UNIT/ML IV SOLN
500.0000 [IU] | Freq: Once | INTRAVENOUS | Status: AC | PRN
  Administered 2021-02-19: 500 [IU]
  Filled 2021-02-19: qty 5

## 2021-02-19 MED ORDER — SODIUM CHLORIDE 0.9 % IV SOLN
200.0000 mg | Freq: Once | INTRAVENOUS | Status: AC
  Administered 2021-02-19: 200 mg via INTRAVENOUS
  Filled 2021-02-19: qty 8

## 2021-02-19 MED ORDER — PROCHLORPERAZINE MALEATE 10 MG PO TABS
ORAL_TABLET | ORAL | Status: AC
  Filled 2021-02-19: qty 1

## 2021-02-19 MED ORDER — ONDANSETRON HCL 4 MG/2ML IJ SOLN
8.0000 mg | Freq: Once | INTRAMUSCULAR | Status: AC
  Administered 2021-02-19: 8 mg via INTRAVENOUS

## 2021-02-19 NOTE — Patient Instructions (Signed)
[facility] ONCOL[facility]   Discharge Instructions: Thank you for choosing [facility] to provide your oncology and hematology care.   If you have a lab appointment with the [facility], please go directly to the [facility] and check in at the registration area.   Wear comfortable clothing and clothing appropriate for easy access to any Portacath or PICC line.   We strive to give you quality time with your provider. You may need to reschedule your appointment if you arrive late (15 or more minutes).  Arriving late affects you and other patients whose appointments are after yours.  Also, if you miss three or more appointments without notifying the office, you may be dismissed from the clinic at the provider's discretion.      For prescription refill requests, have your pharmacy contact our office and allow 72 hours for refills to be completed.    Today you received the following chemotherapy and/or immunotherapy agents: pembrolizumab and pemetrexed.      To help prevent nausea and vomiting after your treatment, we encourage you to take your nausea medication as directed.  BELOW ARE SYMPTOMS THAT SHOULD BE REPORTED IMMEDIATELY: *FEVER GREATER THAN 100.4 F (38 C) OR HIGHER *CHILLS OR SWEATING *NAUSEA AND VOMITING THAT IS NOT CONTROLLED WITH YOUR NAUSEA MEDICATION *UNUSUAL SHORTNESS OF BREATH *UNUSUAL BRUISING OR BLEEDING *URINARY PROBLEMS (pain or burning when urinating, or frequent urination) *BOWEL PROBLEMS (unusual diarrhea, constipation, pain near the anus) TENDERNESS IN MOUTH AND THROAT WITH OR WITHOUT PRESENCE OF ULCERS (sore throat, sores in mouth, or a toothache) UNUSUAL RASH, SWELLING OR PAIN  UNUSUAL VAGINAL DISCHARGE OR ITCHING   Items with * indicate a potential emergency and should be followed up as soon as possible or go to the Emergency Department if any problems should occur.  Please show the CHEMOTHERAPY ALERT CARD or IMMUNOTHERAPY  ALERT CARD at check-in to the Emergency Department and triage nurse.  Should you have questions after your visit or need to cancel or reschedule your appointment, please contact [facility]  Dept: [phone]  and follow the prompts.  Office hours are 8:00 a.m. to 4:30 p.m. Monday - Friday. Please note that voicemails left after 4:00 p.m. may not be returned until the following business day.  We are closed weekends and major holidays. You have access to a nurse at all times for urgent questions. Please call the main number to the clinic Dept: [phone] and follow the prompts.   For any non-urgent questions, you may also contact your provider using MyChart. We now offer e-Visits for anyone 67 and older to request care online for non-urgent symptoms. For details visit [email or web address].   Also download the MyChart app! Go to the app store, search "MyChart", open the app, select [facility], and log in with your MyChart username and password.  Due to Covid, a mask is required upon entering the hospital/clinic. If you do not have a mask, one will be given to you upon arrival. For doctor visits, patients may have 1 support person aged 24 or older with them. For treatment visits, patients cannot have anyone with them due to current Covid guidelines and our immunocompromised population.

## 2021-02-22 ENCOUNTER — Telehealth: Payer: Self-pay | Admitting: Physician Assistant

## 2021-02-22 NOTE — Telephone Encounter (Signed)
Scheduled per los. Called and left msg. Mailed printout  °

## 2021-03-01 ENCOUNTER — Emergency Department (HOSPITAL_COMMUNITY): Payer: BC Managed Care – PPO

## 2021-03-01 ENCOUNTER — Other Ambulatory Visit: Payer: Self-pay

## 2021-03-01 ENCOUNTER — Observation Stay (HOSPITAL_COMMUNITY)
Admission: EM | Admit: 2021-03-01 | Discharge: 2021-03-02 | Disposition: A | Discharge status: Home / Self Care | Payer: BC Managed Care – PPO | Attending: Family Medicine | Admitting: Family Medicine

## 2021-03-01 ENCOUNTER — Encounter (HOSPITAL_COMMUNITY): Payer: Self-pay | Admitting: Family Medicine

## 2021-03-01 DIAGNOSIS — R7402 Elevation of levels of lactic acid dehydrogenase (LDH): Secondary | ICD-10-CM | POA: Diagnosis not present

## 2021-03-01 DIAGNOSIS — Z7982 Long term (current) use of aspirin: Secondary | ICD-10-CM | POA: Diagnosis not present

## 2021-03-01 DIAGNOSIS — R0789 Other chest pain: Principal | ICD-10-CM | POA: Insufficient documentation

## 2021-03-01 DIAGNOSIS — R112 Nausea with vomiting, unspecified: Secondary | ICD-10-CM | POA: Diagnosis not present

## 2021-03-01 DIAGNOSIS — Z79899 Other long term (current) drug therapy: Secondary | ICD-10-CM | POA: Diagnosis not present

## 2021-03-01 DIAGNOSIS — D539 Nutritional anemia, unspecified: Secondary | ICD-10-CM | POA: Diagnosis not present

## 2021-03-01 DIAGNOSIS — Z20822 Contact with and (suspected) exposure to covid-19: Secondary | ICD-10-CM | POA: Diagnosis not present

## 2021-03-01 DIAGNOSIS — E876 Hypokalemia: Secondary | ICD-10-CM | POA: Diagnosis present

## 2021-03-01 DIAGNOSIS — R0902 Hypoxemia: Secondary | ICD-10-CM | POA: Diagnosis not present

## 2021-03-01 DIAGNOSIS — R1084 Generalized abdominal pain: Secondary | ICD-10-CM | POA: Diagnosis present

## 2021-03-01 DIAGNOSIS — J449 Chronic obstructive pulmonary disease, unspecified: Secondary | ICD-10-CM | POA: Diagnosis not present

## 2021-03-01 DIAGNOSIS — Z7901 Long term (current) use of anticoagulants: Secondary | ICD-10-CM | POA: Insufficient documentation

## 2021-03-01 DIAGNOSIS — I739 Peripheral vascular disease, unspecified: Secondary | ICD-10-CM | POA: Diagnosis present

## 2021-03-01 DIAGNOSIS — E785 Hyperlipidemia, unspecified: Secondary | ICD-10-CM | POA: Diagnosis present

## 2021-03-01 DIAGNOSIS — C7951 Secondary malignant neoplasm of bone: Secondary | ICD-10-CM | POA: Diagnosis not present

## 2021-03-01 DIAGNOSIS — I2510 Atherosclerotic heart disease of native coronary artery without angina pectoris: Secondary | ICD-10-CM | POA: Insufficient documentation

## 2021-03-01 DIAGNOSIS — R7989 Other specified abnormal findings of blood chemistry: Secondary | ICD-10-CM | POA: Diagnosis present

## 2021-03-01 DIAGNOSIS — C3491 Malignant neoplasm of unspecified part of right bronchus or lung: Secondary | ICD-10-CM | POA: Diagnosis not present

## 2021-03-01 DIAGNOSIS — R079 Chest pain, unspecified: Secondary | ICD-10-CM | POA: Diagnosis not present

## 2021-03-01 DIAGNOSIS — R197 Diarrhea, unspecified: Secondary | ICD-10-CM | POA: Diagnosis not present

## 2021-03-01 DIAGNOSIS — I10 Essential (primary) hypertension: Secondary | ICD-10-CM | POA: Insufficient documentation

## 2021-03-01 DIAGNOSIS — E871 Hypo-osmolality and hyponatremia: Secondary | ICD-10-CM | POA: Diagnosis present

## 2021-03-01 DIAGNOSIS — Z87891 Personal history of nicotine dependence: Secondary | ICD-10-CM | POA: Insufficient documentation

## 2021-03-01 DIAGNOSIS — K219 Gastro-esophageal reflux disease without esophagitis: Secondary | ICD-10-CM | POA: Diagnosis present

## 2021-03-01 LAB — HEPATIC FUNCTION PANEL
ALT: 36 U/L (ref 0–44)
AST: 52 U/L — ABNORMAL HIGH (ref 15–41)
Albumin: 3.1 g/dL — ABNORMAL LOW (ref 3.5–5.0)
Alkaline Phosphatase: 57 U/L (ref 38–126)
Bilirubin, Direct: 0.1 mg/dL (ref 0.0–0.2)
Indirect Bilirubin: 0.5 mg/dL (ref 0.3–0.9)
Total Bilirubin: 0.6 mg/dL (ref 0.3–1.2)
Total Protein: 6.4 g/dL — ABNORMAL LOW (ref 6.5–8.1)

## 2021-03-01 LAB — BASIC METABOLIC PANEL
Anion gap: 10 (ref 5–15)
BUN: 9 mg/dL (ref 8–23)
CO2: 26 mmol/L (ref 22–32)
Calcium: 9.1 mg/dL (ref 8.9–10.3)
Chloride: 90 mmol/L — ABNORMAL LOW (ref 98–111)
Creatinine, Ser: 0.94 mg/dL (ref 0.61–1.24)
GFR, Estimated: >60 mL/min (ref >60)
Glucose, Bld: 161 mg/dL — ABNORMAL HIGH (ref 70–99)
Potassium: 3.1 mmol/L — ABNORMAL LOW (ref 3.5–5.1)
Sodium: 126 mmol/L — ABNORMAL LOW (ref 135–145)

## 2021-03-01 LAB — CBC
HCT: 23.3 % — ABNORMAL LOW (ref 39.0–52.0)
Hemoglobin: 8.1 g/dL — ABNORMAL LOW (ref 13.0–17.0)
MCH: 37.7 pg — ABNORMAL HIGH (ref 26.0–34.0)
MCHC: 34.8 g/dL (ref 30.0–36.0)
MCV: 108.4 fL — ABNORMAL HIGH (ref 80.0–100.0)
Platelets: 198 10*3/uL (ref 150–400)
RBC: 2.15 MIL/uL — ABNORMAL LOW (ref 4.22–5.81)
RDW: 21.1 % — ABNORMAL HIGH (ref 11.5–15.5)
WBC: 4.2 10*3/uL (ref 4.0–10.5)
nRBC: 0 % (ref 0.0–0.2)

## 2021-03-01 LAB — MAGNESIUM: Magnesium: 1.2 mg/dL — ABNORMAL LOW (ref 1.7–2.4)

## 2021-03-01 LAB — URINALYSIS, ROUTINE W REFLEX MICROSCOPIC
Bilirubin Urine: NEGATIVE
Glucose, UA: NEGATIVE mg/dL
Hgb urine dipstick: NEGATIVE
Ketones, ur: NEGATIVE mg/dL
Leukocytes,Ua: NEGATIVE
Nitrite: NEGATIVE
Protein, ur: NEGATIVE mg/dL
Specific Gravity, Urine: 1.021 (ref 1.005–1.030)
pH: 8 (ref 5.0–8.0)

## 2021-03-01 LAB — LACTIC ACID, PLASMA
Lactic Acid, Venous: 2 mmol/L (ref 0.5–1.9)
Lactic Acid, Venous: 2.5 mmol/L (ref 0.5–1.9)
Lactic Acid, Venous: 2.5 mmol/L (ref 0.5–1.9)

## 2021-03-01 LAB — RESP PANEL BY RT-PCR (FLU A&B, COVID) ARPGX2
Influenza A by PCR: NEGATIVE
Influenza B by PCR: NEGATIVE
SARS Coronavirus 2 by RT PCR: NEGATIVE

## 2021-03-01 LAB — TROPONIN I (HIGH SENSITIVITY)
Troponin I (High Sensitivity): 6 ng/L (ref <18)
Troponin I (High Sensitivity): 11 ng/L (ref <18)
Troponin I (High Sensitivity): 15 ng/L (ref <18)

## 2021-03-01 LAB — HIV ANTIBODY (ROUTINE TESTING W REFLEX): HIV Screen 4th Generation wRfx: NONREACTIVE

## 2021-03-01 MED ORDER — ONDANSETRON HCL 4 MG/2ML IJ SOLN: 4.0000 mg | Freq: Four times a day (QID) | INTRAMUSCULAR | Status: DC | PRN

## 2021-03-01 MED ORDER — CLOPIDOGREL BISULFATE 75 MG PO TABS
75.0000 mg | ORAL_TABLET | Freq: Every day | ORAL | Status: DC
  Administered 2021-03-02: 75 mg via ORAL
  Filled 2021-03-01 (×2): qty 1

## 2021-03-01 MED ORDER — ROSUVASTATIN CALCIUM 5 MG PO TABS
5.0000 mg | ORAL_TABLET | ORAL | Status: DC
  Filled 2021-03-01: qty 1

## 2021-03-01 MED ORDER — LACTATED RINGERS IV BOLUS
1000.0000 mL | Freq: Once | INTRAVENOUS | Status: AC
  Administered 2021-03-01: 1000 mL via INTRAVENOUS

## 2021-03-01 MED ORDER — METOCLOPRAMIDE HCL 5 MG/ML IJ SOLN
10.0000 mg | Freq: Once | INTRAMUSCULAR | Status: AC
  Administered 2021-03-01: 10 mg via INTRAVENOUS
  Filled 2021-03-01: qty 2

## 2021-03-01 MED ORDER — DIPHENHYDRAMINE HCL 50 MG/ML IJ SOLN
25.0000 mg | Freq: Once | INTRAMUSCULAR | Status: AC
  Administered 2021-03-01: 25 mg via INTRAVENOUS
  Filled 2021-03-01: qty 1

## 2021-03-01 MED ORDER — SODIUM CHLORIDE 0.9 % IV SOLN: INTRAVENOUS | Status: AC

## 2021-03-01 MED ORDER — NITROGLYCERIN 0.4 MG SL SUBL: 0.4000 mg | SUBLINGUAL_TABLET | SUBLINGUAL | Status: DC | PRN

## 2021-03-01 MED ORDER — MORPHINE SULFATE (PF) 4 MG/ML IV SOLN
4.0000 mg | Freq: Once | INTRAVENOUS | Status: AC
  Administered 2021-03-01: 4 mg via INTRAVENOUS
  Filled 2021-03-01: qty 1

## 2021-03-01 MED ORDER — POTASSIUM CHLORIDE 10 MEQ/100ML IV SOLN
10.0000 meq | INTRAVENOUS | Status: AC
  Administered 2021-03-01 (×3): 10 meq via INTRAVENOUS
  Filled 2021-03-01 (×3): qty 100

## 2021-03-01 MED ORDER — ACETAMINOPHEN 500 MG PO TABS
1000.0000 mg | ORAL_TABLET | Freq: Once | ORAL | Status: AC
  Administered 2021-03-01: 1000 mg via ORAL
  Filled 2021-03-01: qty 2

## 2021-03-01 MED ORDER — ONDANSETRON HCL 4 MG PO TABS: 4.0000 mg | ORAL_TABLET | Freq: Four times a day (QID) | ORAL | Status: DC | PRN

## 2021-03-01 MED ORDER — ADULT MULTIVITAMIN W/MINERALS CH
1.0000 | ORAL_TABLET | Freq: Every day | ORAL | Status: DC
  Administered 2021-03-02: 1 via ORAL
  Filled 2021-03-01: qty 1

## 2021-03-01 MED ORDER — ONDANSETRON HCL 4 MG/2ML IJ SOLN
INTRAMUSCULAR | Status: AC
  Filled 2021-03-01: qty 2

## 2021-03-01 MED ORDER — OXYCODONE HCL 5 MG PO TABS
5.0000 mg | ORAL_TABLET | Freq: Once | ORAL | Status: AC
  Administered 2021-03-01: 5 mg via ORAL
  Filled 2021-03-01: qty 1

## 2021-03-01 MED ORDER — PANTOPRAZOLE SODIUM 40 MG PO TBEC
40.0000 mg | DELAYED_RELEASE_TABLET | Freq: Two times a day (BID) | ORAL | Status: DC
  Administered 2021-03-01 – 2021-03-02 (×2): 40 mg via ORAL
  Filled 2021-03-01 (×2): qty 1

## 2021-03-01 MED ORDER — CHLORHEXIDINE GLUCONATE CLOTH 2 % EX PADS
6.0000 | MEDICATED_PAD | Freq: Every day | CUTANEOUS | Status: DC
  Administered 2021-03-02: 6 via TOPICAL

## 2021-03-01 MED ORDER — ACETAMINOPHEN 325 MG PO TABS: 650.0000 mg | ORAL_TABLET | Freq: Four times a day (QID) | ORAL | Status: DC | PRN

## 2021-03-01 MED ORDER — IOHEXOL 350 MG/ML SOLN
100.0000 mL | Freq: Once | INTRAVENOUS | Status: AC | PRN
  Administered 2021-03-01: 100 mL via INTRAVENOUS

## 2021-03-01 MED ORDER — UMECLIDINIUM-VILANTEROL 62.5-25 MCG/INH IN AEPB
1.0000 | INHALATION_SPRAY | Freq: Every day | RESPIRATORY_TRACT | Status: DC
  Administered 2021-03-01 – 2021-03-02 (×2): 1 via RESPIRATORY_TRACT
  Filled 2021-03-01: qty 14

## 2021-03-01 MED ORDER — MAGNESIUM OXIDE -MG SUPPLEMENT 400 (240 MG) MG PO TABS
200.0000 mg | ORAL_TABLET | Freq: Every day | ORAL | Status: DC
  Administered 2021-03-02: 200 mg via ORAL
  Filled 2021-03-01: qty 1

## 2021-03-01 MED ORDER — MAGNESIUM 250 MG PO TABS: 250.0000 mg | ORAL_TABLET | Freq: Every day | ORAL | Status: DC

## 2021-03-01 MED ORDER — ASPIRIN EC 81 MG PO TBEC
81.0000 mg | DELAYED_RELEASE_TABLET | Freq: Every day | ORAL | Status: DC
  Administered 2021-03-02: 81 mg via ORAL
  Filled 2021-03-01: qty 1

## 2021-03-01 MED ORDER — ENOXAPARIN SODIUM 40 MG/0.4ML IJ SOSY
40.0000 mg | PREFILLED_SYRINGE | INTRAMUSCULAR | Status: DC
  Administered 2021-03-01: 40 mg via SUBCUTANEOUS
  Filled 2021-03-01: qty 0.4

## 2021-03-01 MED ORDER — ONDANSETRON HCL 4 MG/2ML IJ SOLN
4.0000 mg | Freq: Once | INTRAMUSCULAR | Status: AC
  Administered 2021-03-01: 4 mg via INTRAVENOUS
  Filled 2021-03-01: qty 2

## 2021-03-01 MED ORDER — FOLIC ACID 1 MG PO TABS
1.0000 mg | ORAL_TABLET | Freq: Every day | ORAL | Status: DC
  Administered 2021-03-02: 1 mg via ORAL
  Filled 2021-03-01: qty 1

## 2021-03-01 MED ORDER — LORATADINE 10 MG PO TABS
10.0000 mg | ORAL_TABLET | Freq: Every day | ORAL | Status: DC
  Administered 2021-03-02: 10 mg via ORAL
  Filled 2021-03-01: qty 1

## 2021-03-01 MED ORDER — PANTOPRAZOLE SODIUM 40 MG PO TBEC: 80.0000 mg | DELAYED_RELEASE_TABLET | Freq: Every day | ORAL | Status: DC

## 2021-03-01 MED ORDER — HYDROCODONE-ACETAMINOPHEN 5-325 MG PO TABS
1.0000 | ORAL_TABLET | ORAL | Status: DC | PRN
  Administered 2021-03-02: 1 via ORAL
  Filled 2021-03-01: qty 1

## 2021-03-01 MED ORDER — ACETAMINOPHEN 650 MG RE SUPP: 650.0000 mg | Freq: Four times a day (QID) | RECTAL | Status: DC | PRN

## 2021-03-01 MED ORDER — KETOROLAC TROMETHAMINE 15 MG/ML IJ SOLN
15.0000 mg | Freq: Once | INTRAMUSCULAR | Status: AC
  Administered 2021-03-01: 15 mg via INTRAVENOUS
  Filled 2021-03-01: qty 1

## 2021-03-01 NOTE — Progress Notes (Signed)
Received report at 60 from [place], [place] in ED.  Pt not yet on 6N at shift change. Gave report to Terrence Dupont, Therapist, sports at [phone].

## 2021-03-01 NOTE — ED Notes (Signed)
Unable to do lab work from IV in place by EMS. Pt wants his port accessed for labs and CTA PE work up. IV team consulted.

## 2021-03-01 NOTE — Plan of Care (Signed)

## 2021-03-01 NOTE — H&P (Addendum)
History and Physical    Sean Rose TDV:[id number] DOB: 15-Apr-1956 DOA: 03/01/2021  PCP: [name], NP Consultants:  cardiology: Dr. Gwenlyn Found, oncology: Dr. [name]   pulmonology: Dr. Valeta Harms  Patient coming from:  Home - lives with his wife   Chief Complaint: severe abdominal pain  HPI: Sean Rose is a 65 y.o. male with medical history significant of PVD, CAD, HTN, HLD, adenocarcinoma of right lung stage 4 with mets into his shoulder blade, COPD,  who presented to ER with severe abdominal pain from lower stomach to his chest. It felt like a "huge cramp." The pain started around 3am this morning in his left shoulder then around 4am he woke up with pain in his entire abdomen/pelvis. He felt filled with air. Pain rated as a 7/10 and constant in nature. He doesn't remember if he was short of breath. He had some episodes of vomiting and states he passed out, but didn't lose consciousness. He stood up to use the bathroom and started to get lightheaded and sat back down. He never truly had a syncopal episode. He continued to have nausea and vomiting and came into ER this AM. Pain has resolved with medication given in ER. He is feeling better. He has had no fever, but has had chills. He denies any dysuria, URI symptoms specifically cough/congestion. He has had no diarrhea, headaches, vision changes. He has not been around any known sick contacts.    Last chemo treatment was Monday 02/19/21.    ED Course: vitals: temp: 99.1, bp: 123/76, HR: 79, RR: 14, oxygen: 98% 2L [place],  Sodium: 126 (baseline: 127-129), potassium: 3.1, hgb: 8.1, mcv: 108.4, troponin: 6--->11, lactic acid: 2.0-->2.5. given 2L boluses, tylenol, benadryl, tradol, reglan, morphine, zofran. CTA negative for PE. CT abdo/pelvis with not acute findings. Asked to admit for intractable N/V.   Review of Systems: As per HPI; otherwise review of systems reviewed and negative.   Ambulatory Status:  Ambulates without assistance  Past Medical  History:  Diagnosis Date   Anginal pain ([place])    CAD (coronary artery disease)    CAP (community acquired pneumonia) 09/2016   COPD (chronic obstructive pulmonary disease) (HCC)    GERD (gastroesophageal reflux disease)    Heart murmur    "I was told I had one when I was a kid"   Hemorrhoids    History of anal fissures    "no surgeries" (10/30/2016)   Hyperlipidemia    Hypertension    lung ca dx'd 08/2018   Peripheral arterial disease ([place])    status post right common iliac artery stenting back in 2007   Seasonal allergies    Tobacco abuse     Past Surgical History:  Procedure Laterality Date   ANKLE SURGERY Left    "rebuilt it"   ANTERIOR CRUCIATE LIGAMENT REPAIR Right    CARDIAC CATHETERIZATION  09/23/2008   Continued medical therapy - may need GI evaluation in addition.   CARDIAC CATHETERIZATION  10/28/2007   Medical therapy recommended.   CARDIAC CATHETERIZATION  11/18/2006   In-stent restenosis RCA  (50% distal edge, 80% segmental mid, and 50-60% segmental proximal). Successful cutting balloon atherectomy using a [id number] cutting balloon. 3 inflations with atherectomy performed on mid and proximal portions resulting in reduction of 80% mid in-stent restenosis to less than 20% residual and 50-60% segmental proximal to less than 20% residual without dissection.   CARDIAC CATHETERIZATION  02/26/2006   Severe stenosis in RCA. Stenting performed using IVUS. 3.5x20 Maverick balloon deployed  at 6atm 34sec. Distal stent-a 4x28 Liberte stent-deployed 12atm 48sec, 12atm 31sec, 4atm 19sec. Mid stent-a 4x28 Liberte stent-deployed 14atm 45sec, 14atm 60sec, 14atm 44sec. Proximal stent-4x8 Liberte- 14atm 45sec,14atm 47sec, 16atm 43sec. Severely diseased segment then appeared TIMI-3 flow.   CARDIOVASCULAR STRESS TEST  11/17/2012   No significant ECG changes. Septal perfusion defect is new when complared to study from 2010. Abnormal myocardial perfusion imaging with a basal to mid perfusion suggestive of  previous MI.   CAROTID DOPPLER  08/09/2011   Bilateral Bulb/Proximal ICA - demonstrated a mild amount of fibrous plaque without evidence of significant diameter reduction reduction or other vascular abnormality.   CHEST TUBE INSERTION Right 09/02/2018   Procedure: Chest Tube Insertion;  Surgeon: Garner Nash, DO;  Location: [place];  Service: Thoracic;  Laterality: Right;   COLONOSCOPY     2003, 2014   CORONARY ANGIOPLASTY WITH STENT PLACEMENT     FEMORAL ARTERY STENT     INGUINAL HERNIA REPAIR Right    IR IMAGING GUIDED PORT INSERTION  11/13/2020   KNEE ARTHROSCOPY Right "multiple"   LAPAROSCOPIC APPENDECTOMY N/A 12/07/2018   Procedure: APPENDECTOMY LAPAROSCOPIC;  Surgeon: Coralie Keens, MD;  Location: [facility];  Service: General;  Laterality: N/A;   LOWER EXTREMITY ARTERIAL DOPPLER  01/31/2011   Bilateral ABIs-normal values with no suggestion of arterial insuff to the lower extremities at rest. Right CIA stent-mild amount of nonhemodynamically significant plaque is noted throughout   [place]   "bone-eating tumor"   PERCUTANEOUS STENT INTERVENTION  04/04/2006 & 04/13/2015   a. Right common iliac artery with an 8.0x18 mm Herculink stent deployed at 12 atm. Stenosis was reduced from 80% to 0% with brisk flow. b. I-cast stenting to left common iliac artery   PERIPHERAL VASCULAR CATHETERIZATION N/A 04/13/2015   Procedure: Lower Extremity Angiography;  Surgeon: Lorretta Harp, MD; L-oCIA 75%, 40-50% L-EIA, R-CIA stent patent, s/p 8 mm x 38 mm ICast covered stent>>0% stenosis in [place] Right    TRANSTHORACIC ECHOCARDIOGRAM  11/26/2012   EF not noted. Aortic valve-sclerosis without stenosis, no regurgiation.    UPPER GASTROINTESTINAL ENDOSCOPY     US CAROTID DOPPLER BILATERAL ([place] [facility])  08/09/2011   Bilateral Bulb/Proximal ICAa demonstrated a mild amount of fibrous plaque without evidence of significant diameter reduction or any other  vascular abnormality.   VIDEO ASSISTED THORACOSCOPY (VATS)/ LOBECTOMY Right 09/14/2018   Procedure: RIGHT VIDEO ASSISTED THORACOSCOPY (VATS)/ RIGHT UPPER LOBECTOMY;  Surgeon: Melrose Nakayama, MD;  Location: [facility];  Service: Thoracic;  Laterality: Right;   VIDEO BRONCHOSCOPY WITH ENDOBRONCHIAL NAVIGATION N/A 09/02/2018   Procedure: VIDEO BRONCHOSCOPY WITH ENDOBRONCHIAL NAVIGATION;  Surgeon: Garner Nash, DO;  Location: [facility];  Service: Thoracic;  Laterality: N/A;    Social History   Socioeconomic History   Marital status: Married    Spouse name: Not on file   Number of children: Not on file   Years of education: Not on file   Highest education level: Not on file  Occupational History   Not on file  Tobacco Use   Smoking status: Former    Packs/day: 0.25    Years: 38.00    Pack years: 9.50    Types: Cigarettes    Quit date: 10/19/2018    Years since quitting: 2.3   Smokeless tobacco: Never   Tobacco comments:    pack per day 12.17.19  Vaping Use   Vaping Use: Former  Substance and Sexual Activity   Alcohol use: Yes    Comment: socially   Drug use: No   Sexual activity: Yes  Other Topics Concern   Not on file  Social History Narrative   Not on file   Social Determinants of Health   Financial Resource Strain: Not on file  Food Insecurity: Not on file  Transportation Needs: Not on file  Physical Activity: Not on file  Stress: Not on file  Social Connections: Not on file  Intimate Partner Violence: Not on file    Allergies  Allergen Reactions   Compazine [Prochlorperazine] Other (See Comments)    " made him high and could not sleep". He does not want to take it again.    Family History  Problem Relation Age of Onset   Colon cancer Mother    Heart disease Father    Heart disease Paternal Grandfather     Prior to Admission medications   Medication Sig Start Date End Date Taking? Authorizing Provider  amLODipine (NORVASC) 10 MG tablet TAKE 1 TABLET(10  MG) BY MOUTH DAILY Patient taking differently: Take 10 mg by mouth daily. 09/19/20  Yes Lorretta Harp, MD  aspirin EC 81 MG tablet Take 81 mg by mouth daily.   Yes [provider]  cetirizine (ZYRTEC) 10 MG tablet Take 10 mg by mouth at bedtime as needed for allergies.   Yes [provider]  clopidogrel (PLAVIX) 75 MG tablet TAKE 1 TABLET BY MOUTH EVERY DAY Patient taking differently: Take 75 mg by mouth daily. 09/19/20  Yes Lorretta Harp, MD  esomeprazole (NEXIUM) 20 MG capsule Take 40 mg by mouth daily.   Yes [provider]  folic acid (FOLVITE) 1 MG tablet Take 1 tablet (1 mg total) by mouth daily. 10/30/20  Yes [name], MD  ibuprofen (ADVIL,MOTRIN) 200 MG tablet Take 400 mg by mouth every 6 (six) hours as needed for headache or moderate pain.    Yes [provider]  lidocaine-prilocaine (EMLA) cream Apply to the Port-A-Cath site 30-60 minutes before treatment. 10/30/20  Yes [name], MD  Magnesium 250 MG TABS Take 250 mg by mouth daily.   Yes [provider]  metoprolol succinate (TOPROL-XL) 100 MG 24 hr tablet TAKE 1 TABLET BY MOUTH EVERY DAY Patient taking differently: Take 100 mg by mouth daily. 10/17/20  Yes Lorretta Harp, MD  Multiple Vitamin (MULTIVITAMIN WITH MINERALS) TABS tablet Take 1 tablet by mouth daily.   Yes [provider]  nitroGLYCERIN (NITROSTAT) 0.4 MG SL tablet PLACE 1 TABLET UNDER THE TONGUE EVERY 5 MINUTES AS NEEDED FOR CHEST PAIN. Patient taking differently: Place 0.4 mg under the tongue every 5 (five) minutes as needed for chest pain. 09/19/20  Yes Lorretta Harp, MD  ondansetron (ZOFRAN) 8 MG tablet Take 1 tablet (8 mg total) by mouth every 8 (eight) hours as needed for nausea or vomiting. 11/22/20  Yes [name], MD  rosuvastatin (CRESTOR) 5 MG tablet Take 1 tablet (5 mg total) by mouth every other day. 08/30/20 03/01/21 Yes Lorretta Harp, MD  umeclidinium-vilanterol (ANORO ELLIPTA)  62.5-25 MCG/INH AEPB Inhale 1 puff into the lungs daily. 11/23/20  Yes Garner Nash, DO    Physical Exam: Vitals:   03/01/21 1707 03/01/21 1800 03/01/21 1836 03/01/21 1959  BP: 100/65 108/70 120/78 124/71  Pulse: 74 75 70 80  Resp: _0 Temp: 99.4 F (37.4 C)  99.3 F (37.4 C) 99.1 F (37.3  C)  TempSrc:   Oral Oral  SpO2: 95% 98% 99% 93%     General:  Appears calm and comfortable and is in NAD Eyes:  PERRL, EOMI, normal lids, iris ENT:  grossly normal hearing, lips & tongue, mmm; appropriate dentition Neck:  no LAD, masses or thyromegaly; no carotid bruits Cardiovascular:  RRR, no m/r/g. No LE edema.  Respiratory:   bilateral crackles in bases with no wheezes/rales/rhonchi.  Normal respiratory effort. Abdomen:  soft, NT, ND, NABS Back:   normal alignment, no CVAT Skin:  no rash or induration seen on limited exam Musculoskeletal:  grossly normal tone BUE/BLE, good ROM, no bony abnormality Lower extremity:  No LE edema.  Limited foot exam with no ulcerations.  2+ distal pulses. Psychiatric:  grossly normal mood and affect, speech fluent and appropriate, AOx3 Neurologic:  CN 2-12 grossly intact, moves all extremities in coordinated fashion, sensation intact    Radiological Exams on Admission: Independently reviewed - see discussion in A/P where applicable  DG Chest 2 View  Result Date: 03/01/2021 CLINICAL DATA:  Chest pain. EXAM: CHEST - 2 VIEW COMPARISON:  CT 01/05/2021.  Chest x-ray 09/28/2019. FINDINGS: PowerPort catheter noted with tip over SVC. Heart size normal. Surgical sutures right upper chest. Lungs are clear. No pleural effusion or pneumothorax. Degenerative change and scoliosis thoracic spine. Left scapular lesion best identified by prior CT. IMPRESSION: 1.  PowerPort catheter with tip over SVC. 2. Postsurgical changes right lung. No acute cardiopulmonary disease identified. 3.  Left scapular lesion best identified by prior CT. Electronically Signed   By:  Marcello Moores  Register   On: 03/01/2021 09:23   CT Angio Chest PE W and/or Wo Contrast  Result Date: 03/01/2021 CLINICAL DATA:  65 year old male with history of chest pain. History of lung cancer with metastatic disease to the left shoulder blade. EXAM: CT ANGIOGRAPHY CHEST CT ABDOMEN AND PELVIS WITH CONTRAST TECHNIQUE: Multidetector CT imaging of the chest was performed using the standard protocol during bolus administration of intravenous contrast. Multiplanar CT image reconstructions and MIPs were obtained to evaluate the vascular anatomy. Multidetector CT imaging of the abdomen and pelvis was performed using the standard protocol during bolus administration of intravenous contrast. CONTRAST:  153m OMNIPAQUE IOHEXOL 350 MG/ML SOLN COMPARISON:  CT the chest, abdomen and pelvis 01/05/2021. FINDINGS: CTA CHEST FINDINGS Cardiovascular: No filling defects within the pulmonary arterial tree to suggest pulmonary embolism. Heart size is normal. There is no significant pericardial fluid, thickening or pericardial calcification. There is aortic atherosclerosis, as well as atherosclerosis of the great vessels of the mediastinum and the coronary arteries, including calcified atherosclerotic plaque in the left main, left anterior descending and right coronary arteries. Mild calcifications of the mitral annulus. Right internal jugular single-lumen porta cath with tip terminating at the superior cavoatrial junction. Mediastinum/Nodes: No pathologically enlarged mediastinal or hilar lymph nodes. Esophagus is unremarkable in appearance. No axillary lymphadenopathy. Esophagus is unremarkable in appearance. No axillary lymphadenopathy. Lungs/Pleura: Status post right upper lobectomy with compensatory hyperexpansion of the right middle and lower lobes. New areas of ground-glass attenuation, consolidation, septal thickening and architectural distortion in the apex of the left upper lobe and to a lesser extent in the posterior aspect of  the left lower lobe, likely to reflect evolving postradiation changes related to therapy directed at the left scapula and left supraclavicular lymph nodes. No definite suspicious appearing pulmonary nodules or masses are otherwise noted. No pleural effusions. Diffuse bronchial wall thickening with moderate to severe centrilobular and paraseptal emphysema. Musculoskeletal:  Lytic lesion in the left scapula, increasingly sclerotic compared to the prior study, suggesting some interval bony healing from positive response to therapy. No other new aggressive appearing lytic or blastic lesions are noted elsewhere in the visualized portions of the skeleton. Review of the MIP images confirms the above findings. CT ABDOMEN and PELVIS FINDINGS Hepatobiliary: No suspicious cystic or solid hepatic lesions. No intra or extrahepatic biliary ductal dilatation. Gallbladder is normal in appearance. Pancreas: No pancreatic mass. No pancreatic ductal dilatation. No pancreatic or peripancreatic fluid collections or inflammatory changes. Spleen: Unremarkable. Adrenals/Urinary Tract: Bilateral kidneys and bilateral adrenal glands are normal in appearance. No hydroureteronephrosis. Urinary bladder is normal in appearance. Stomach/Bowel: The appearance of the stomach is normal. There is no pathologic dilatation of small bowel or colon. The appendix is not confidently identified and may be surgically absent. Regardless, there are no inflammatory changes noted adjacent to the cecum to suggest the presence of an acute appendicitis at this time. Vascular/Lymphatic: Aortic atherosclerosis, without evidence of aneurysm or dissection in the abdominal or pelvic vasculature. Vascular stents are noted in the common iliac arteries bilaterally which appear grossly patent. Retroaortic left renal vein (normal anatomical variant) incidentally noted. No lymphadenopathy noted in the abdomen or pelvis. Reproductive: Prostate gland and seminal vesicles are  unremarkable in appearance. Other: No significant volume of ascites.  No pneumoperitoneum. Musculoskeletal: There are no aggressive appearing lytic or blastic lesions noted in the visualized portions of the skeleton. Review of the MIP images confirms the above findings. IMPRESSION: 1. No evidence of pulmonary embolism. 2. Known metastatic lesion the left scapula demonstrates some interval increase in sclerosis suggesting a positive response to therapy. There appears to be some evolving postradiation changes in the lungs, as above. 3. No new sites of metastatic disease noted elsewhere in the chest, abdomen or pelvis. 4. Diffuse bronchial wall thickening with moderate to severe centrilobular and paraseptal emphysema; imaging findings suggestive of underlying COPD. 5. Aortic atherosclerosis, in addition to left main and 2 vessel coronary artery disease. Please note that although the presence of coronary artery calcium documents the presence of coronary artery disease, the severity of this disease and any potential stenosis cannot be assessed on this non-gated CT examination. Assessment for potential risk factor modification, dietary therapy or pharmacologic therapy may be warranted, if clinically indicated. 6. There are calcifications of the mitral annulus. Echocardiographic correlation for evaluation of potential valvular dysfunction may be warranted if clinically indicated. 7. Additional incidental findings, as above. Electronically Signed   By: Vinnie Langton M.D.   On: 03/01/2021 11:22   CT ABDOMEN PELVIS W CONTRAST  Result Date: 03/01/2021 CLINICAL DATA:  65 year old male with history of chest pain. History of lung cancer with metastatic disease to the left shoulder blade. EXAM: CT ANGIOGRAPHY CHEST CT ABDOMEN AND PELVIS WITH CONTRAST TECHNIQUE: Multidetector CT imaging of the chest was performed using the standard protocol during bolus administration of intravenous contrast. Multiplanar CT image  reconstructions and MIPs were obtained to evaluate the vascular anatomy. Multidetector CT imaging of the abdomen and pelvis was performed using the standard protocol during bolus administration of intravenous contrast. CONTRAST:  167m OMNIPAQUE IOHEXOL 350 MG/ML SOLN COMPARISON:  CT the chest, abdomen and pelvis 01/05/2021. FINDINGS: CTA CHEST FINDINGS Cardiovascular: No filling defects within the pulmonary arterial tree to suggest pulmonary embolism. Heart size is normal. There is no significant pericardial fluid, thickening or pericardial calcification. There is aortic atherosclerosis, as well as atherosclerosis of the great vessels of the mediastinum and the  coronary arteries, including calcified atherosclerotic plaque in the left main, left anterior descending and right coronary arteries. Mild calcifications of the mitral annulus. Right internal jugular single-lumen porta cath with tip terminating at the superior cavoatrial junction. Mediastinum/Nodes: No pathologically enlarged mediastinal or hilar lymph nodes. Esophagus is unremarkable in appearance. No axillary lymphadenopathy. Esophagus is unremarkable in appearance. No axillary lymphadenopathy. Lungs/Pleura: Status post right upper lobectomy with compensatory hyperexpansion of the right middle and lower lobes. New areas of ground-glass attenuation, consolidation, septal thickening and architectural distortion in the apex of the left upper lobe and to a lesser extent in the posterior aspect of the left lower lobe, likely to reflect evolving postradiation changes related to therapy directed at the left scapula and left supraclavicular lymph nodes. No definite suspicious appearing pulmonary nodules or masses are otherwise noted. No pleural effusions. Diffuse bronchial wall thickening with moderate to severe centrilobular and paraseptal emphysema. Musculoskeletal: Lytic lesion in the left scapula, increasingly sclerotic compared to the prior study, suggesting  some interval bony healing from positive response to therapy. No other new aggressive appearing lytic or blastic lesions are noted elsewhere in the visualized portions of the skeleton. Review of the MIP images confirms the above findings. CT ABDOMEN and PELVIS FINDINGS Hepatobiliary: No suspicious cystic or solid hepatic lesions. No intra or extrahepatic biliary ductal dilatation. Gallbladder is normal in appearance. Pancreas: No pancreatic mass. No pancreatic ductal dilatation. No pancreatic or peripancreatic fluid collections or inflammatory changes. Spleen: Unremarkable. Adrenals/Urinary Tract: Bilateral kidneys and bilateral adrenal glands are normal in appearance. No hydroureteronephrosis. Urinary bladder is normal in appearance. Stomach/Bowel: The appearance of the stomach is normal. There is no pathologic dilatation of small bowel or colon. The appendix is not confidently identified and may be surgically absent. Regardless, there are no inflammatory changes noted adjacent to the cecum to suggest the presence of an acute appendicitis at this time. Vascular/Lymphatic: Aortic atherosclerosis, without evidence of aneurysm or dissection in the abdominal or pelvic vasculature. Vascular stents are noted in the common iliac arteries bilaterally which appear grossly patent. Retroaortic left renal vein (normal anatomical variant) incidentally noted. No lymphadenopathy noted in the abdomen or pelvis. Reproductive: Prostate gland and seminal vesicles are unremarkable in appearance. Other: No significant volume of ascites.  No pneumoperitoneum. Musculoskeletal: There are no aggressive appearing lytic or blastic lesions noted in the visualized portions of the skeleton. Review of the MIP images confirms the above findings. IMPRESSION: 1. No evidence of pulmonary embolism. 2. Known metastatic lesion the left scapula demonstrates some interval increase in sclerosis suggesting a positive response to therapy. There appears to  be some evolving postradiation changes in the lungs, as above. 3. No new sites of metastatic disease noted elsewhere in the chest, abdomen or pelvis. 4. Diffuse bronchial wall thickening with moderate to severe centrilobular and paraseptal emphysema; imaging findings suggestive of underlying COPD. 5. Aortic atherosclerosis, in addition to left main and 2 vessel coronary artery disease. Please note that although the presence of coronary artery calcium documents the presence of coronary artery disease, the severity of this disease and any potential stenosis cannot be assessed on this non-gated CT examination. Assessment for potential risk factor modification, dietary therapy or pharmacologic therapy may be warranted, if clinically indicated. 6. There are calcifications of the mitral annulus. Echocardiographic correlation for evaluation of potential valvular dysfunction may be warranted if clinically indicated. 7. Additional incidental findings, as above. Electronically Signed   By: Vinnie Langton M.D.   On: 03/01/2021 11:22  EKG: Independently reviewed.  NSR with rate 80; nonspecific ST changes with no evidence of acute ischemia   Labs on Admission: I have personally reviewed the available labs and imaging studies at the time of the admission.  Pertinent labs:  Sodium: 126 (baseline: 127-129),  potassium: 3.1,  hgb: 8.1 mcv: 108.4 troponin: 6--->11 lactic acid: 2.0-->2.5,     Assessment/Plan Principal Problem:   Intractable nausea and vomiting -? If viral gastroenteritis or chemo related even though last session was 7/25 -doing much better with IVF and anti emetics, but will continue to watch him overnight as still had some episodes with standing.  -continue IVF overnight and PRN anti emetics.  -follow lactic acid as no obvious source of infection, but he is immunosuppressed and will need to monitor him clinically  Active Problems:   Hypokalemia -likely from vomiting  -magnesium  pending -replace and recheck in AM   Generalized abdominal pain -has resolved since being in ER. CT abdo/pelvis with no acute findings.  -continue to monitor    Elevated lactic acid level -no obvious source of infection, but he is immunocompromised -pan cultured and repeat lactic acid pending  -continue IVF overnight if due to volume status -also consulted oncology  Macrocytic anemia Hgb stable at 8.1, but was 9.7 10 days ago with history down to 7.2 1 month ago.  No signs of bleeding, likely secondary to cancer -holding lovenox and doing SCDs -oncology consulted     Essential hypertension Has been hypotensive Holding home Norvasc and beta blocker     Coronary atherosclerosis of native coronary artery Stable. Continue Asa/plavix and beta blocker and statin      Hyponatremia At baseline likely secondary to stage 4 cancer.  Continue to follow    Adenocarcinoma of right lung, stage 4 ([place]) -followed by Dr. Inda Merlin outpatient -I did call Dr. Chryl Heck     PVD (peripheral vascular disease) ([place]) Stable Continue ASA and plavix     Hyperlipidemia  Stable, continue statin   Calcification of mitral annulus Echo outpatient with his cardiologist if indicated.   Copd Continue home inhalers, stable.   GERD Continue PPI   There is no height or weight on file to calculate BMI.     Level of care: Med-Surg DVT prophylaxis:  SCDs Code Status:  Full - confirmed with patient Family Communication: None present Disposition Plan:  The patient is from: home  Anticipated d/c is to: home   Anticipated d/c date will depend on clinical response to treatment, but possibly as early as tomorrow if he has excellent response to treatment  Patient is currently: acutely ill and requires hospitalization for IVF, IV meds and fluid repletion.  Consults called: oncology   Admission status:  observation   Orma Flaming MD [facility]   How to contact the [facility] Attending or  Consulting provider [place] or covering provider during after hours [place], for this patient?  Check the care team in [facility] and look for a) attending/consulting [facility] provider listed and b) the [facility] team listed Log into [email or web address] and use [place] universal password to access. If you do not have the password, please contact the hospital operator. Locate the [facility] provider you are looking for under [facility] and page to a number that you can be directly reached. If you still have difficulty reaching the provider, please page the [facility] (Director on Call) for the Hospitalists listed on amion for assistance.   03/01/2021, 8:32 PM

## 2021-03-01 NOTE — ED Notes (Signed)
Pt reports vomiting everytime he stands up to use the urinal. Vomited twice already. MD notified.

## 2021-03-01 NOTE — ED Triage Notes (Addendum)
Pt with hx of lung cancer with metastasis to shoulder blade here for back pain with radiation to chest, onset 0300 this morning. Pt took two nitro with minimal relief prior to EMS arrival. EMS gave 324 asa and 1 nitro. Emesis x 1 in route, so 4 mg zofran given. Currently receiving chemo.

## 2021-03-01 NOTE — ED Notes (Signed)
MD cleared pt to take his home meds.

## 2021-03-01 NOTE — ED Provider Notes (Signed)
[facility] EMERGENCY DEPARTMENT Provider Note   CSN: [id number] Arrival date & time: 03/01/21  0805     History Chief Complaint  Patient presents with   Back Pain   Chest Pain    Sean Rose is a 65 y.o. male.   Back Pain Associated symptoms: chest pain   Associated symptoms: no abdominal pain, no dysuria and no fever   Chest Pain Associated symptoms: back pain, nausea and vomiting   Associated symptoms: no abdominal pain, no cough, no fever, no palpitations and no shortness of breath       65 year old male with a past medical history of non-small cell lung cancer with known metastases to the bone including the left scapula and left clavicle, COPD, CAD, hypertension, hyperlipidemia presenting to the emergency department with chest pain.  Patient reports that he woke this morning at around 3 AM and was experiencing some left sided back pain.  He was concerned that he slept on it wrong or that something with his new metastases was "acting up".  He states that shortly after that, his symptoms changed to include a pressure-like sensation that was from the bottom of his neck down to his abdomen.  He states that this pressure-like sensation is similar to when he had a heart attack in the past.  He denies any diaphoresis, shortness of breath.  He denies any recent fever or cough.  He states that he tried to take 2 nitro, but realized that they were sleeping pills after he swallowed them and then he threw them up.  He states that he then took 2 sublingual nitroglycerin but they did not change his pain.  He endorses continued chest pain here in the emergency department.  He was given aspirin and 1 nitroglycerin with EMS.  Patient states his last chemotherapy treatment was roughly 2 weeks ago.  Past Medical History:  Diagnosis Date   Anginal pain ([place])    CAD (coronary artery disease)    CAP (community acquired pneumonia) 09/2016   COPD (chronic obstructive pulmonary  disease) (HCC)    GERD (gastroesophageal reflux disease)    Heart murmur    "I was told I had one when I was a kid"   Hemorrhoids    History of anal fissures    "no surgeries" (10/30/2016)   Hyperlipidemia    Hypertension    lung ca dx'd 08/2018   Peripheral arterial disease ([place])    status post right common iliac artery stenting back in 2007   Seasonal allergies    Tobacco abuse     Patient Active Problem List   Diagnosis Date Noted   Port-A-Cath in place 11/21/2020   Adenocarcinoma of right lung, stage 4 ([place]) 10/30/2020   Encounter for antineoplastic immunotherapy 10/30/2020   Bone metastasis ([place]) 10/01/2020   Abdominal pain 12/07/2018   Appendicitis 12/07/2018   Goals of care, counseling/discussion 10/13/2018   Encounter for antineoplastic chemotherapy 10/13/2018   S/P Right VATS with Right Upper Lobectomy, Lymph Node Sampling, Intercostal Nerve Block 09/14/2018   Pneumothorax after biopsy 09/02/2018   Stage 1 mild COPD by GOLD classification ([place]) 07/30/2018   Syncope 07/23/2018   Antiplatelet or antithrombotic long-term use 07/14/2018   Nodule of upper lobe of right lung 07/14/2018   Multiple lung nodules on CT 07/14/2018   Nausea vomiting and diarrhea 10/30/2016   Hyponatremia 10/30/2016   Hypocalcemia 10/30/2016   Hypokalemia 04/14/2015   Hypomagnesemia 04/14/2015   Claudication ([place]) 04/13/2015   Essential hypertension  01/14/2014   Hyperlipidemia 01/14/2014   Thrombosed external hemorrhoids 12/07/2013   Chronic anticoagulation 01/20/2013   PVD (peripheral vascular disease) ([place]) 01/20/2013   Coronary atherosclerosis of native coronary artery 01/20/2013   GERD (gastroesophageal reflux disease) 01/20/2013   Benign esophageal stricture 01/20/2013   DENTAL CARIES 07/20/2010    Past Surgical History:  Procedure Laterality Date   ANKLE SURGERY Left    "rebuilt it"   ANTERIOR CRUCIATE LIGAMENT REPAIR Right    CARDIAC CATHETERIZATION  09/23/2008   Continued  medical therapy - may need GI evaluation in addition.   CARDIAC CATHETERIZATION  10/28/2007   Medical therapy recommended.   CARDIAC CATHETERIZATION  11/18/2006   In-stent restenosis RCA  (50% distal edge, 80% segmental mid, and 50-60% segmental proximal). Successful cutting balloon atherectomy using a [id number] cutting balloon. 3 inflations with atherectomy performed on mid and proximal portions resulting in reduction of 80% mid in-stent restenosis to less than 20% residual and 50-60% segmental proximal to less than 20% residual without dissection.   CARDIAC CATHETERIZATION  02/26/2006   Severe stenosis in RCA. Stenting performed using IVUS. 3.5x20 Maverick balloon deployed at [employer]. Distal stent-a 4x28 Liberte stent-deployed 12atm 48sec, 12atm 31sec, 4atm 19sec. Mid stent-a 4x28 Liberte stent-deployed 14atm 45sec, 14atm 60sec, 14atm 44sec. Proximal stent-4x8 Liberte- 14atm 45sec,14atm 47sec, 16atm 43sec. Severely diseased segment then appeared TIMI-3 flow.   CARDIOVASCULAR STRESS TEST  11/17/2012   No significant ECG changes. Septal perfusion defect is new when complared to study from 2010. Abnormal myocardial perfusion imaging with a basal to mid perfusion suggestive of previous MI.   CAROTID DOPPLER  08/09/2011   Bilateral Bulb/Proximal ICA - demonstrated a mild amount of fibrous plaque without evidence of significant diameter reduction reduction or other vascular abnormality.   CHEST TUBE INSERTION Right 09/02/2018   Procedure: Chest Tube Insertion;  Surgeon: Garner Nash, DO;  Location: [facility];  Service: Thoracic;  Laterality: Right;   COLONOSCOPY     2003, 2014   CORONARY ANGIOPLASTY WITH STENT PLACEMENT     FEMORAL ARTERY STENT     INGUINAL HERNIA REPAIR Right    IR IMAGING GUIDED PORT INSERTION  11/13/2020   KNEE ARTHROSCOPY Right "multiple"   LAPAROSCOPIC APPENDECTOMY N/A 12/07/2018   Procedure: APPENDECTOMY LAPAROSCOPIC;  Surgeon: Coralie Keens, MD;  Location: [facility];  Service: General;   Laterality: N/A;   LOWER EXTREMITY ARTERIAL DOPPLER  01/31/2011   Bilateral ABIs-normal values with no suggestion of arterial insuff to the lower extremities at rest. Right CIA stent-mild amount of nonhemodynamically significant plaque is noted throughout   [place]   "bone-eating tumor"   PERCUTANEOUS STENT INTERVENTION  04/04/2006 & 04/13/2015   a. Right common iliac artery with an 8.0x18 mm Herculink stent deployed at 12 atm. Stenosis was reduced from 80% to 0% with brisk flow. b. I-cast stenting to left common iliac artery   PERIPHERAL VASCULAR CATHETERIZATION N/A 04/13/2015   Procedure: Lower Extremity Angiography;  Surgeon: Lorretta Harp, MD; L-oCIA 75%, 40-50% L-EIA, R-CIA stent patent, s/p 8 mm x 38 mm ICast covered stent>>0% stenosis in [place] Right    TRANSTHORACIC ECHOCARDIOGRAM  11/26/2012   EF not noted. Aortic valve-sclerosis without stenosis, no regurgiation.    UPPER GASTROINTESTINAL ENDOSCOPY     US CAROTID DOPPLER BILATERAL ([place] [facility])  08/09/2011   Bilateral Bulb/Proximal ICAa demonstrated a mild amount of fibrous plaque without evidence of significant diameter reduction or  any other vascular abnormality.   VIDEO ASSISTED THORACOSCOPY (VATS)/ LOBECTOMY Right 09/14/2018   Procedure: RIGHT VIDEO ASSISTED THORACOSCOPY (VATS)/ RIGHT UPPER LOBECTOMY;  Surgeon: Melrose Nakayama, MD;  Location: [facility];  Service: Thoracic;  Laterality: Right;   VIDEO BRONCHOSCOPY WITH ENDOBRONCHIAL NAVIGATION N/A 09/02/2018   Procedure: VIDEO BRONCHOSCOPY WITH ENDOBRONCHIAL NAVIGATION;  Surgeon: Garner Nash, DO;  Location: [facility];  Service: Thoracic;  Laterality: N/A;       Family History  Problem Relation Age of Onset   Colon cancer Mother    Heart disease Father    Heart disease Paternal Grandfather     Social History   Tobacco Use   Smoking status: Former    Packs/day: 0.25    Years: 38.00    Pack years: 9.50    Types:  Cigarettes    Quit date: 10/19/2018    Years since quitting: 2.3   Smokeless tobacco: Never   Tobacco comments:    pack per day 12.17.19  Vaping Use   Vaping Use: Former  Substance Use Topics   Alcohol use: Yes    Comment: socially   Drug use: No    Home Medications Prior to Admission medications   Medication Sig Start Date End Date Taking? Authorizing Provider  amLODipine (NORVASC) 10 MG tablet TAKE 1 TABLET(10 MG) BY MOUTH DAILY Patient taking differently: Take 10 mg by mouth daily. 09/19/20  Yes Lorretta Harp, MD  aspirin EC 81 MG tablet Take 81 mg by mouth daily.   Yes [provider]  cetirizine (ZYRTEC) 10 MG tablet Take 10 mg by mouth at bedtime as needed for allergies.   Yes [provider]  clopidogrel (PLAVIX) 75 MG tablet TAKE 1 TABLET BY MOUTH EVERY DAY Patient taking differently: Take 75 mg by mouth daily. 09/19/20  Yes Lorretta Harp, MD  esomeprazole (NEXIUM) 20 MG capsule Take 40 mg by mouth daily.   Yes [provider]  folic acid (FOLVITE) 1 MG tablet Take 1 tablet (1 mg total) by mouth daily. 10/30/20  Yes [name], MD  ibuprofen (ADVIL,MOTRIN) 200 MG tablet Take 400 mg by mouth every 6 (six) hours as needed for headache or moderate pain.    Yes [provider]  lidocaine-prilocaine (EMLA) cream Apply to the Port-A-Cath site 30-60 minutes before treatment. 10/30/20  Yes [name], MD  Magnesium 250 MG TABS Take 250 mg by mouth daily.   Yes [provider]  metoprolol succinate (TOPROL-XL) 100 MG 24 hr tablet TAKE 1 TABLET BY MOUTH EVERY DAY Patient taking differently: Take 100 mg by mouth daily. 10/17/20  Yes Lorretta Harp, MD  Multiple Vitamin (MULTIVITAMIN WITH MINERALS) TABS tablet Take 1 tablet by mouth daily.   Yes [provider]  nitroGLYCERIN (NITROSTAT) 0.4 MG SL tablet PLACE 1 TABLET UNDER THE TONGUE EVERY 5 MINUTES AS NEEDED FOR CHEST PAIN. Patient taking differently: Place 0.4 mg  under the tongue every 5 (five) minutes as needed for chest pain. 09/19/20  Yes Lorretta Harp, MD  ondansetron (ZOFRAN) 8 MG tablet Take 1 tablet (8 mg total) by mouth every 8 (eight) hours as needed for nausea or vomiting. 11/22/20  Yes [name], MD  rosuvastatin (CRESTOR) 5 MG tablet Take 1 tablet (5 mg total) by mouth every other day. 08/30/20 03/01/21 Yes Lorretta Harp, MD  umeclidinium-vilanterol (ANORO ELLIPTA) 62.5-25 MCG/INH AEPB Inhale 1 puff into the lungs daily. 11/23/20  Yes Icard, Octavio Graves, DO    Allergies  Compazine [prochlorperazine]  Review of Systems   Review of Systems  Constitutional:  Negative for chills and fever.  HENT:  Negative for ear pain and sore throat.   Eyes:  Negative for pain and visual disturbance.  Respiratory:  Negative for cough and shortness of breath.   Cardiovascular:  Positive for chest pain. Negative for palpitations.  Gastrointestinal:  Positive for nausea and vomiting. Negative for abdominal pain.  Genitourinary:  Negative for dysuria and hematuria.  Musculoskeletal:  Positive for back pain. Negative for arthralgias.  Skin:  Negative for color change and rash.  Neurological:  Negative for seizures and syncope.  All other systems reviewed and are negative.  Physical Exam Updated Vital Signs BP 92/66   Pulse 80   Temp 97.7 F (36.5 C) (Oral)   Resp 15   SpO2 91%   Physical Exam Vitals and nursing note reviewed.  Constitutional:      General: He is not in acute distress.    Appearance: He is not ill-appearing or toxic-appearing.     Comments: Thin  HENT:     Head: Normocephalic and atraumatic.  Eyes:     Conjunctiva/sclera: Conjunctivae normal.  Cardiovascular:     Rate and Rhythm: Normal rate and regular rhythm.     Heart sounds: No murmur heard. Pulmonary:     Effort: Pulmonary effort is normal. No respiratory distress.     Breath sounds: Normal breath sounds. No stridor. No wheezing, rhonchi or rales.  Abdominal:      Palpations: Abdomen is soft.     Tenderness: There is no abdominal tenderness. There is no guarding or rebound.  Musculoskeletal:     Cervical back: Normal range of motion and neck supple. No rigidity or tenderness.  Skin:    General: Skin is warm and dry.     Capillary Refill: Capillary refill takes less than 2 seconds.  Neurological:     Mental Status: He is alert and oriented to person, place, and time.   ED Results / Procedures / Treatments   Labs (all labs ordered are listed, but only abnormal results are displayed) Labs Reviewed  BASIC METABOLIC PANEL - Abnormal; Notable for the following components:      Result Value   Sodium 126 (*)    Potassium 3.1 (*)    Chloride 90 (*)    Glucose, Bld 161 (*)    All other components within normal limits  CBC - Abnormal; Notable for the following components:   RBC 2.15 (*)    Hemoglobin 8.1 (*)    HCT 23.3 (*)    MCV 108.4 (*)    MCH 37.7 (*)    RDW 21.1 (*)    All other components within normal limits  LACTIC ACID, PLASMA - Abnormal; Notable for the following components:   Lactic Acid, Venous 2.0 (*)    All other components within normal limits  HEPATIC FUNCTION PANEL - Abnormal; Notable for the following components:   Total Protein 6.4 (*)    Albumin 3.1 (*)    AST 52 (*)    All other components within normal limits  LACTIC ACID, PLASMA - Abnormal; Notable for the following components:   Lactic Acid, Venous 2.5 (*)    All other components within normal limits  CULTURE, BLOOD (ROUTINE X 2)  CULTURE, BLOOD (ROUTINE X 2)  RESP PANEL BY RT-PCR (FLU A&B, COVID) ARPGX2  URINALYSIS, ROUTINE W REFLEX MICROSCOPIC  TROPONIN I (HIGH SENSITIVITY)  TROPONIN I (HIGH SENSITIVITY)  TROPONIN  I (HIGH SENSITIVITY)    EKG None  Radiology DG Chest 2 View  Result Date: 03/01/2021 CLINICAL DATA:  Chest pain. EXAM: CHEST - 2 VIEW COMPARISON:  CT 01/05/2021.  Chest x-ray 09/28/2019. FINDINGS: PowerPort catheter noted with tip over SVC.  Heart size normal. Surgical sutures right upper chest. Lungs are clear. No pleural effusion or pneumothorax. Degenerative change and scoliosis thoracic spine. Left scapular lesion best identified by prior CT. IMPRESSION: 1.  PowerPort catheter with tip over SVC. 2. Postsurgical changes right lung. No acute cardiopulmonary disease identified. 3.  Left scapular lesion best identified by prior CT. Electronically Signed   By: Marcello Moores  Register   On: 03/01/2021 09:23   CT Angio Chest PE W and/or Wo Contrast  Result Date: 03/01/2021 CLINICAL DATA:  65 year old male with history of chest pain. History of lung cancer with metastatic disease to the left shoulder blade. EXAM: CT ANGIOGRAPHY CHEST CT ABDOMEN AND PELVIS WITH CONTRAST TECHNIQUE: Multidetector CT imaging of the chest was performed using the standard protocol during bolus administration of intravenous contrast. Multiplanar CT image reconstructions and MIPs were obtained to evaluate the vascular anatomy. Multidetector CT imaging of the abdomen and pelvis was performed using the standard protocol during bolus administration of intravenous contrast. CONTRAST:  118m OMNIPAQUE IOHEXOL 350 MG/ML SOLN COMPARISON:  CT the chest, abdomen and pelvis 01/05/2021. FINDINGS: CTA CHEST FINDINGS Cardiovascular: No filling defects within the pulmonary arterial tree to suggest pulmonary embolism. Heart size is normal. There is no significant pericardial fluid, thickening or pericardial calcification. There is aortic atherosclerosis, as well as atherosclerosis of the great vessels of the mediastinum and the coronary arteries, including calcified atherosclerotic plaque in the left main, left anterior descending and right coronary arteries. Mild calcifications of the mitral annulus. Right internal jugular single-lumen porta cath with tip terminating at the superior cavoatrial junction. Mediastinum/Nodes: No pathologically enlarged mediastinal or hilar lymph nodes. Esophagus is  unremarkable in appearance. No axillary lymphadenopathy. Esophagus is unremarkable in appearance. No axillary lymphadenopathy. Lungs/Pleura: Status post right upper lobectomy with compensatory hyperexpansion of the right middle and lower lobes. New areas of ground-glass attenuation, consolidation, septal thickening and architectural distortion in the apex of the left upper lobe and to a lesser extent in the posterior aspect of the left lower lobe, likely to reflect evolving postradiation changes related to therapy directed at the left scapula and left supraclavicular lymph nodes. No definite suspicious appearing pulmonary nodules or masses are otherwise noted. No pleural effusions. Diffuse bronchial wall thickening with moderate to severe centrilobular and paraseptal emphysema. Musculoskeletal: Lytic lesion in the left scapula, increasingly sclerotic compared to the prior study, suggesting some interval bony healing from positive response to therapy. No other new aggressive appearing lytic or blastic lesions are noted elsewhere in the visualized portions of the skeleton. Review of the MIP images confirms the above findings. CT ABDOMEN and PELVIS FINDINGS Hepatobiliary: No suspicious cystic or solid hepatic lesions. No intra or extrahepatic biliary ductal dilatation. Gallbladder is normal in appearance. Pancreas: No pancreatic mass. No pancreatic ductal dilatation. No pancreatic or peripancreatic fluid collections or inflammatory changes. Spleen: Unremarkable. Adrenals/Urinary Tract: Bilateral kidneys and bilateral adrenal glands are normal in appearance. No hydroureteronephrosis. Urinary bladder is normal in appearance. Stomach/Bowel: The appearance of the stomach is normal. There is no pathologic dilatation of small bowel or colon. The appendix is not confidently identified and may be surgically absent. Regardless, there are no inflammatory changes noted adjacent to the cecum to suggest the presence of an acute  appendicitis at this time. Vascular/Lymphatic: Aortic atherosclerosis, without evidence of aneurysm or dissection in the abdominal or pelvic vasculature. Vascular stents are noted in the common iliac arteries bilaterally which appear grossly patent. Retroaortic left renal vein (normal anatomical variant) incidentally noted. No lymphadenopathy noted in the abdomen or pelvis. Reproductive: Prostate gland and seminal vesicles are unremarkable in appearance. Other: No significant volume of ascites.  No pneumoperitoneum. Musculoskeletal: There are no aggressive appearing lytic or blastic lesions noted in the visualized portions of the skeleton. Review of the MIP images confirms the above findings. IMPRESSION: 1. No evidence of pulmonary embolism. 2. Known metastatic lesion the left scapula demonstrates some interval increase in sclerosis suggesting a positive response to therapy. There appears to be some evolving postradiation changes in the lungs, as above. 3. No new sites of metastatic disease noted elsewhere in the chest, abdomen or pelvis. 4. Diffuse bronchial wall thickening with moderate to severe centrilobular and paraseptal emphysema; imaging findings suggestive of underlying COPD. 5. Aortic atherosclerosis, in addition to left main and 2 vessel coronary artery disease. Please note that although the presence of coronary artery calcium documents the presence of coronary artery disease, the severity of this disease and any potential stenosis cannot be assessed on this non-gated CT examination. Assessment for potential risk factor modification, dietary therapy or pharmacologic therapy may be warranted, if clinically indicated. 6. There are calcifications of the mitral annulus. Echocardiographic correlation for evaluation of potential valvular dysfunction may be warranted if clinically indicated. 7. Additional incidental findings, as above. Electronically Signed   By: Vinnie Langton M.D.   On: 03/01/2021 11:22    CT ABDOMEN PELVIS W CONTRAST  Result Date: 03/01/2021 CLINICAL DATA:  65 year old male with history of chest pain. History of lung cancer with metastatic disease to the left shoulder blade. EXAM: CT ANGIOGRAPHY CHEST CT ABDOMEN AND PELVIS WITH CONTRAST TECHNIQUE: Multidetector CT imaging of the chest was performed using the standard protocol during bolus administration of intravenous contrast. Multiplanar CT image reconstructions and MIPs were obtained to evaluate the vascular anatomy. Multidetector CT imaging of the abdomen and pelvis was performed using the standard protocol during bolus administration of intravenous contrast. CONTRAST:  164m OMNIPAQUE IOHEXOL 350 MG/ML SOLN COMPARISON:  CT the chest, abdomen and pelvis 01/05/2021. FINDINGS: CTA CHEST FINDINGS Cardiovascular: No filling defects within the pulmonary arterial tree to suggest pulmonary embolism. Heart size is normal. There is no significant pericardial fluid, thickening or pericardial calcification. There is aortic atherosclerosis, as well as atherosclerosis of the great vessels of the mediastinum and the coronary arteries, including calcified atherosclerotic plaque in the left main, left anterior descending and right coronary arteries. Mild calcifications of the mitral annulus. Right internal jugular single-lumen porta cath with tip terminating at the superior cavoatrial junction. Mediastinum/Nodes: No pathologically enlarged mediastinal or hilar lymph nodes. Esophagus is unremarkable in appearance. No axillary lymphadenopathy. Esophagus is unremarkable in appearance. No axillary lymphadenopathy. Lungs/Pleura: Status post right upper lobectomy with compensatory hyperexpansion of the right middle and lower lobes. New areas of ground-glass attenuation, consolidation, septal thickening and architectural distortion in the apex of the left upper lobe and to a lesser extent in the posterior aspect of the left lower lobe, likely to reflect evolving  postradiation changes related to therapy directed at the left scapula and left supraclavicular lymph nodes. No definite suspicious appearing pulmonary nodules or masses are otherwise noted. No pleural effusions. Diffuse bronchial wall thickening with moderate to severe centrilobular and paraseptal emphysema. Musculoskeletal: Lytic lesion in the left  scapula, increasingly sclerotic compared to the prior study, suggesting some interval bony healing from positive response to therapy. No other new aggressive appearing lytic or blastic lesions are noted elsewhere in the visualized portions of the skeleton. Review of the MIP images confirms the above findings. CT ABDOMEN and PELVIS FINDINGS Hepatobiliary: No suspicious cystic or solid hepatic lesions. No intra or extrahepatic biliary ductal dilatation. Gallbladder is normal in appearance. Pancreas: No pancreatic mass. No pancreatic ductal dilatation. No pancreatic or peripancreatic fluid collections or inflammatory changes. Spleen: Unremarkable. Adrenals/Urinary Tract: Bilateral kidneys and bilateral adrenal glands are normal in appearance. No hydroureteronephrosis. Urinary bladder is normal in appearance. Stomach/Bowel: The appearance of the stomach is normal. There is no pathologic dilatation of small bowel or colon. The appendix is not confidently identified and may be surgically absent. Regardless, there are no inflammatory changes noted adjacent to the cecum to suggest the presence of an acute appendicitis at this time. Vascular/Lymphatic: Aortic atherosclerosis, without evidence of aneurysm or dissection in the abdominal or pelvic vasculature. Vascular stents are noted in the common iliac arteries bilaterally which appear grossly patent. Retroaortic left renal vein (normal anatomical variant) incidentally noted. No lymphadenopathy noted in the abdomen or pelvis. Reproductive: Prostate gland and seminal vesicles are unremarkable in appearance. Other: No significant  volume of ascites.  No pneumoperitoneum. Musculoskeletal: There are no aggressive appearing lytic or blastic lesions noted in the visualized portions of the skeleton. Review of the MIP images confirms the above findings. IMPRESSION: 1. No evidence of pulmonary embolism. 2. Known metastatic lesion the left scapula demonstrates some interval increase in sclerosis suggesting a positive response to therapy. There appears to be some evolving postradiation changes in the lungs, as above. 3. No new sites of metastatic disease noted elsewhere in the chest, abdomen or pelvis. 4. Diffuse bronchial wall thickening with moderate to severe centrilobular and paraseptal emphysema; imaging findings suggestive of underlying COPD. 5. Aortic atherosclerosis, in addition to left main and 2 vessel coronary artery disease. Please note that although the presence of coronary artery calcium documents the presence of coronary artery disease, the severity of this disease and any potential stenosis cannot be assessed on this non-gated CT examination. Assessment for potential risk factor modification, dietary therapy or pharmacologic therapy may be warranted, if clinically indicated. 6. There are calcifications of the mitral annulus. Echocardiographic correlation for evaluation of potential valvular dysfunction may be warranted if clinically indicated. 7. Additional incidental findings, as above. Electronically Signed   By: Vinnie Langton M.D.   On: 03/01/2021 11:22    Procedures Procedures   Medications Ordered in ED Medications  ondansetron (ZOFRAN) 4 MG/2ML injection (  Not Given 03/01/21 1458)  ondansetron (ZOFRAN) injection 4 mg (4 mg Intravenous Given 03/01/21 0935)  morphine 4 MG/ML injection 4 mg (4 mg Intravenous Given 03/01/21 0935)  lactated ringers bolus 1,000 mL (0 mLs Intravenous Stopped 03/01/21 1126)  iohexol (OMNIPAQUE) 350 MG/ML injection 100 mL (100 mLs Intravenous Contrast Given 03/01/21 1034)  ketorolac (TORADOL) 15  MG/ML injection 15 mg (15 mg Intravenous Given 03/01/21 1131)  acetaminophen (TYLENOL) tablet 1,000 mg (1,000 mg Oral Given 03/01/21 1132)  oxyCODONE (Oxy IR/ROXICODONE) immediate release tablet 5 mg (5 mg Oral Given 03/01/21 1132)  lactated ringers bolus 1,000 mL (0 mLs Intravenous Stopped 03/01/21 1457)  metoCLOPramide (REGLAN) injection 10 mg (10 mg Intravenous Given 03/01/21 1512)  diphenhydrAMINE (BENADRYL) injection 25 mg (25 mg Intravenous Given 03/01/21 1512)    ED Course  I have reviewed the triage vital  signs and the nursing notes.  Pertinent labs & imaging results that were available during my care of the patient were reviewed by me and considered in my medical decision making (see chart for details).    MDM Rules/Calculators/A&P                           65 year old male with CAD and lung cancer with known bony metastases to the left scapula presenting to the emergency department with chest pain.  Initially described as back pain, it then went to his chest and now is radiating down his abdomen as well.  Vital signs reviewed, within acceptable limits.  Physical exam is notable for a cachectic appearing male in no acute distress.  Differential includes PE, ACS, dissection, progression of his known disease.  ACS seems less likely to me, as the pain was present at rest and did not improve with nitroglycerin.  We will obtain an EKG, CBC, BMP, troponin, chest x-ray, CTA PE study as well as a CT of the abdomen pelvis to look for both pulmonary embolism and dissection.  EKG appears to represent sinus rhythm, no anatomic ST elevations or ST depressions of be concerning for acute ischemia.  CBC shows a WBC of 4.2, hemoglobin of 8.1, platelet count of 198.  Overall, hemoglobin is decreased from 9.7-8.1 which was measured 10 days ago, I believe this makes sense given his recent chemotherapy.  The patient denies any falls, melena, hematochezia or other sources of blood loss.  All of patient's cell counts are  decreased similarly.  Metabolic panel notable for sodium of 126, roughly stable from 1 2710 days ago.  He does have a potassium of 3.1, which I will help replace here in the emergency department.  Normal bicarb, normal creatinine.  Initial troponin of 6.  LFTs unremarkable.  Initial lactic acid technically elevated at 2, will repeat now the patient has gotten some fluids.  CTA PE study did not reveal any pulmonary embolism.  It did show an interval increase in the sclerotic lesion of his left scapula that is consistent with treatment response, but no other abnormalities of the chest, abdomen, or pelvis acutely.  On reevaluation, the patient's pain was well controlled with oxycodone, Tylenol, Toradol.  However, patient continued of nausea and vomiting.  Given additional fluids and antiemetics.  Repeat troponin pending.   Repeat lactic acid following fluids increased from 2-2.5.  Patient continues to have nausea and nonbloody nonbilious emesis despite Zofran x2 and Reglan and Benadryl.  Given uptrending lactic acid and continuing ongoing GI losses, will admit the patient to the hospital for intractable nausea and vomiting.  We will obtain blood cultures and urine study to complete infectious work-up, although patient continues to deny any fever or chills.  Handoff given to admitting team.  Final Clinical Impression(s) / ED Diagnoses Final diagnoses:  Atypical chest pain    Rx / DC Orders ED Discharge Orders     None        Claud Kelp, MD 03/01/21 1637    Elnora Morrison, MD 03/02/21 2211

## 2021-03-01 NOTE — ED Notes (Signed)
IV team at bedside 

## 2021-03-02 DIAGNOSIS — R112 Nausea with vomiting, unspecified: Secondary | ICD-10-CM | POA: Diagnosis not present

## 2021-03-02 LAB — CBC
HCT: 21.2 % — ABNORMAL LOW (ref 39.0–52.0)
Hemoglobin: 7.4 g/dL — ABNORMAL LOW (ref 13.0–17.0)
MCH: 37.9 pg — ABNORMAL HIGH (ref 26.0–34.0)
MCHC: 34.9 g/dL (ref 30.0–36.0)
MCV: 108.7 fL — ABNORMAL HIGH (ref 80.0–100.0)
Platelets: 172 10*3/uL (ref 150–400)
RBC: 1.95 MIL/uL — ABNORMAL LOW (ref 4.22–5.81)
RDW: 21.8 % — ABNORMAL HIGH (ref 11.5–15.5)
WBC: 6.7 10*3/uL (ref 4.0–10.5)
nRBC: 0 % (ref 0.0–0.2)

## 2021-03-02 LAB — BASIC METABOLIC PANEL
Anion gap: 8 (ref 5–15)
BUN: 10 mg/dL (ref 8–23)
CO2: 26 mmol/L (ref 22–32)
Calcium: 8.5 mg/dL — ABNORMAL LOW (ref 8.9–10.3)
Chloride: 93 mmol/L — ABNORMAL LOW (ref 98–111)
Creatinine, Ser: 1.01 mg/dL (ref 0.61–1.24)
GFR, Estimated: >60 mL/min (ref >60)
Glucose, Bld: 99 mg/dL (ref 70–99)
Potassium: 4.1 mmol/L (ref 3.5–5.1)
Sodium: 127 mmol/L — ABNORMAL LOW (ref 135–145)

## 2021-03-02 LAB — LACTIC ACID, PLASMA: Lactic Acid, Venous: 1.5 mmol/L (ref 0.5–1.9)

## 2021-03-02 LAB — PREPARE RBC (CROSSMATCH)

## 2021-03-02 MED ORDER — HEPARIN SOD (PORK) LOCK FLUSH 100 UNIT/ML IV SOLN
500.0000 [IU] | INTRAVENOUS | Status: AC | PRN
  Administered 2021-03-02: 500 [IU]
  Filled 2021-03-02: qty 5

## 2021-03-02 MED ORDER — SODIUM CHLORIDE 0.9% FLUSH: 10.0000 mL | INTRAVENOUS | Status: DC | PRN

## 2021-03-02 MED ORDER — ACETAMINOPHEN 325 MG PO TABS
650.0000 mg | ORAL_TABLET | Freq: Once | ORAL | Status: AC
  Administered 2021-03-02: 650 mg via ORAL
  Filled 2021-03-02: qty 2

## 2021-03-02 MED ORDER — ENSURE ENLIVE PO LIQD: 237.0000 mL | Freq: Three times a day (TID) | ORAL | Status: DC

## 2021-03-02 MED ORDER — FUROSEMIDE 10 MG/ML IJ SOLN
20.0000 mg | Freq: Once | INTRAMUSCULAR | Status: AC
  Administered 2021-03-02: 20 mg via INTRAVENOUS
  Filled 2021-03-02: qty 2

## 2021-03-02 MED ORDER — DIPHENHYDRAMINE HCL 25 MG PO CAPS
25.0000 mg | ORAL_CAPSULE | Freq: Once | ORAL | Status: AC
  Administered 2021-03-02: 25 mg via ORAL
  Filled 2021-03-02: qty 1

## 2021-03-02 MED ORDER — SODIUM CHLORIDE 0.9% IV SOLUTION: Freq: Once | INTRAVENOUS | Status: AC

## 2021-03-02 MED ORDER — MAGNESIUM 500 MG PO TABS: 500.0000 mg | ORAL_TABLET | Freq: Three times a day (TID) | ORAL | 0 refills | Status: DC

## 2021-03-02 MED ORDER — SODIUM CHLORIDE 0.9% FLUSH
10.0000 mL | Freq: Two times a day (BID) | INTRAVENOUS | Status: DC
  Administered 2021-03-02: 10 mL

## 2021-03-02 MED ORDER — ENSURE ENLIVE PO LIQD
237.0000 mL | Freq: Two times a day (BID) | ORAL | Status: DC
  Administered 2021-03-02: 237 mL via ORAL

## 2021-03-02 NOTE — Hospital Course (Signed)
64 [address] home dwelling Metastatic NSCLC 3A (T3 N1 M0) + intraparenchymal LAD since 08/2018 + recurrence 09/2020-RUL lobectomy, cisplatin 4 cycles/Alimta/Keytruda + palliative XRT Recent OV 02/19/2021 Dr. Earlie Server noting occasional T-max 100.4+ anorexia + nagging cough and had cycle 6 at that visit  Present back pain--radiation to chest no relief nitro ASA CT PE protocol no embolism-increased sclerotic rib lesion left scapula WBC 4 hemoglobin 8 (down from 9.7) Sodium 126 potassium 3.1  Lactic 2.0   Episodic recurrent vomiting in the emergency room

## 2021-03-02 NOTE — Discharge Summary (Signed)
Physician Discharge Summary  Sean Rose ZJQ:[id number] DOB: 1956-01-22 DOA: 03/01/2021  PCP: [name], NP  Admit date: 03/01/2021 Discharge date: 03/02/2021  Time spent: 27 minutes  Recommendations for Outpatient Follow-up:  Please follow-up blood cultures performed 03/01/2021-etiology of patient's symptoms were thought to be more in keeping with chemotherapy changes recently but could not completely rule out infection this was explained clearly to patient and wife on the patient's discharge from the hospital CBC plus differential Chem-12 INR at outpatient oncology follow-up  Discharge Diagnoses:  MAIN problem for hospitalization   nausea vomiting diarrhea possibly secondary to effects of chemo-unclear if patient had underlying infection at time of admission-I did not think the patient was septic on admission despite lactic acidosis  Please see below for itemized issues addressed in HOpsital- refer to other progress notes for clarity if needed  Discharge Condition: Improved  Diet recommendation: Heart healthy  There were no vitals filed for this visit.  History of present illness:  65 WM home dwelling RCA stent BMS with recent stent stenosis redilatation 2018+ R common iliac stent 2007 Tobacco abuse Metastatic NSCLC 3A (T3 N1 M0) + intraparenchymal LAD since 08/2018 + recurrence 09/2020-RUL lobectomy, cisplatin 4 cycles/Alimta/Keytruda + palliative XRT Recent OV 02/19/2021 Dr. Earlie Server noting occasional T-max 100.4+ anorexia + nagging cough and had cycle 6 at that visit Patient subsequently tells me on interview on 8/5 that ever since his platinum-based chemotherapy was discontinued and he was maintained on Alimta and Keytruda he has had more side effects has had more nausea and has not felt as well  Presented initially to the emergency room 8/4 back pain--radiation to chest no relief nitro ASA-experience several episodes of nausea vomiting-has had loose stools in addition but this  seems to have been resolved at the time that I saw him CT PE protocol no embolism-increased sclerotic rib lesion left scapula WBC 4 hemoglobin 8 (down from 9.7) Sodium 126 potassium 3.1  Lactic cycle down from about 2.5 to less than 2 and went to 1.5 with fluid resuscitation  I had a long discussion with Dr. Earlie Server regarding the details of the patient's admission and it was concluded that patient's likely diagnosis was probably more in keeping with side effects from the change in chemotherapy  Dr. Earlie Server recommended that we transfuse the patient 2 units PRBC Patient was insistent on going home and I had a detailed risk-benefit discussion with him regarding [place] not having his blood cultures-patient expressed an interest in sleeping in his own bed and eating at home and has a trip that he plans on going to on 8/6-I have cautioned him with strict return precautions we will prescribe him a little bit of magnesium because he was hypomagnesemic He has a chronically low sodium presumably because of his metastatic lung cancer and he will be transfused and discharged later this evening barring any other barriers or symptoms  Patient had no significant bleeding that was notable during hospitalization and was resumed on aspirin and Plavix from prior to admission   Discharge Exam: Vitals:   03/02/21 1540 03/02/21 1608  BP: 101/69 101/61  Pulse: 68 82  Resp: 18 18  Temp: 98.2 F (36.8 C) 99.7 F (37.6 C)  SpO2:  94%    Subj on day of d/c   Looks well feels a little weak otherwise no distress no complaints soft stool semiformed no fever no chills  General Exam on discharge  EOMI frail appearing white male no distress S1-S2 no murmur Port in  place Abdomen soft no rebound no guarding Chest clear no added sound ROM intact Neurologically intact  Discharge Instructions   Discharge Instructions     Diet - low sodium heart healthy   Complete by: As directed    Discharge instructions    Complete by: As directed    Please follow-up with Dr. Earlie Server in the outpatient setting with regards to further planning lab work etc.-he is aware that you were admitted and that you will probably need follow-up labs and discussions about the next steps with chemo Report any adverse effects from chemotherapy etc. to his office If you have fevers chills etc. we will need you to be seen You had a test called a blood culture Detert determine if you had an infection this hospital stay and I do not think you did-this will be monitored however in the outpatient setting by Dr. Earlie Server who can easily call in a prescription if indeed there is a concern for the same   Increase activity slowly   Complete by: As directed       Allergies as of 03/02/2021       Reactions   Compazine [prochlorperazine] Other (See Comments)   " made him high and could not sleep". He does not want to take it again.        Medication List     STOP taking these medications    amLODipine 10 MG tablet Commonly known as: NORVASC   ibuprofen 200 MG tablet Commonly known as: ADVIL   metoprolol succinate 100 MG 24 hr tablet Commonly known as: TOPROL-XL       TAKE these medications    Anoro Ellipta 62.5-25 MCG/INH Aepb Generic drug: umeclidinium-vilanterol Inhale 1 puff into the lungs daily.   aspirin EC 81 MG tablet Take 81 mg by mouth daily.   cetirizine 10 MG tablet Commonly known as: ZYRTEC Take 10 mg by mouth at bedtime as needed for allergies.   clopidogrel 75 MG tablet Commonly known as: PLAVIX TAKE 1 TABLET BY MOUTH EVERY DAY   esomeprazole 20 MG capsule Commonly known as: NEXIUM Take 40 mg by mouth daily.   folic acid 1 MG tablet Commonly known as: FOLVITE Take 1 tablet (1 mg total) by mouth daily.   lidocaine-prilocaine cream Commonly known as: EMLA Apply to the Port-A-Cath site 30-60 minutes before treatment.   Magnesium 250 MG Tabs Take 250 mg by mouth daily.   multivitamin  with minerals Tabs tablet Take 1 tablet by mouth daily.   nitroGLYCERIN 0.4 MG SL tablet Commonly known as: NITROSTAT PLACE 1 TABLET UNDER THE TONGUE EVERY 5 MINUTES AS NEEDED FOR CHEST PAIN. What changed: See the new instructions.   ondansetron 8 MG tablet Commonly known as: ZOFRAN Take 1 tablet (8 mg total) by mouth every 8 (eight) hours as needed for nausea or vomiting.   rosuvastatin 5 MG tablet Commonly known as: CRESTOR Take 1 tablet (5 mg total) by mouth every other day.       Allergies  Allergen Reactions   Compazine [Prochlorperazine] Other (See Comments)    " made him high and could not sleep". He does not want to take it again.    Follow-up Information     [name], NP.   Specialty: Family Medicine Contact information: [address] [place] [address] [phone]                  The results of significant diagnostics from this hospitalization (including imaging, microbiology,  ancillary and laboratory) are listed below for reference.    Significant Diagnostic Studies: DG Chest 2 View  Result Date: 03/01/2021 CLINICAL DATA:  Chest pain. EXAM: CHEST - 2 VIEW COMPARISON:  CT 01/05/2021.  Chest x-ray 09/28/2019. FINDINGS: PowerPort catheter noted with tip over SVC. Heart size normal. Surgical sutures right upper chest. Lungs are clear. No pleural effusion or pneumothorax. Degenerative change and scoliosis thoracic spine. Left scapular lesion best identified by prior CT. IMPRESSION: 1.  PowerPort catheter with tip over SVC. 2. Postsurgical changes right lung. No acute cardiopulmonary disease identified. 3.  Left scapular lesion best identified by prior CT. Electronically Signed   By: Marcello Moores  Register   On: 03/01/2021 09:23   CT Angio Chest PE W and/or Wo Contrast  Result Date: 03/01/2021 CLINICAL DATA:  65 year old male with history of chest pain. History of lung cancer with metastatic disease to the left shoulder blade. EXAM: CT ANGIOGRAPHY  CHEST CT ABDOMEN AND PELVIS WITH CONTRAST TECHNIQUE: Multidetector CT imaging of the chest was performed using the standard protocol during bolus administration of intravenous contrast. Multiplanar CT image reconstructions and MIPs were obtained to evaluate the vascular anatomy. Multidetector CT imaging of the abdomen and pelvis was performed using the standard protocol during bolus administration of intravenous contrast. CONTRAST:  144mL OMNIPAQUE IOHEXOL 350 MG/ML SOLN COMPARISON:  CT the chest, abdomen and pelvis 01/05/2021. FINDINGS: CTA CHEST FINDINGS Cardiovascular: No filling defects within the pulmonary arterial tree to suggest pulmonary embolism. Heart size is normal. There is no significant pericardial fluid, thickening or pericardial calcification. There is aortic atherosclerosis, as well as atherosclerosis of the great vessels of the mediastinum and the coronary arteries, including calcified atherosclerotic plaque in the left main, left anterior descending and right coronary arteries. Mild calcifications of the mitral annulus. Right internal jugular single-lumen porta cath with tip terminating at the superior cavoatrial junction. Mediastinum/Nodes: No pathologically enlarged mediastinal or hilar lymph nodes. Esophagus is unremarkable in appearance. No axillary lymphadenopathy. Esophagus is unremarkable in appearance. No axillary lymphadenopathy. Lungs/Pleura: Status post right upper lobectomy with compensatory hyperexpansion of the right middle and lower lobes. New areas of ground-glass attenuation, consolidation, septal thickening and architectural distortion in the apex of the left upper lobe and to a lesser extent in the posterior aspect of the left lower lobe, likely to reflect evolving postradiation changes related to therapy directed at the left scapula and left supraclavicular lymph nodes. No definite suspicious appearing pulmonary nodules or masses are otherwise noted. No pleural effusions.  Diffuse bronchial wall thickening with moderate to severe centrilobular and paraseptal emphysema. Musculoskeletal: Lytic lesion in the left scapula, increasingly sclerotic compared to the prior study, suggesting some interval bony healing from positive response to therapy. No other new aggressive appearing lytic or blastic lesions are noted elsewhere in the visualized portions of the skeleton. Review of the MIP images confirms the above findings. CT ABDOMEN and PELVIS FINDINGS Hepatobiliary: No suspicious cystic or solid hepatic lesions. No intra or extrahepatic biliary ductal dilatation. Gallbladder is normal in appearance. Pancreas: No pancreatic mass. No pancreatic ductal dilatation. No pancreatic or peripancreatic fluid collections or inflammatory changes. Spleen: Unremarkable. Adrenals/Urinary Tract: Bilateral kidneys and bilateral adrenal glands are normal in appearance. No hydroureteronephrosis. Urinary bladder is normal in appearance. Stomach/Bowel: The appearance of the stomach is normal. There is no pathologic dilatation of small bowel or colon. The appendix is not confidently identified and may be surgically absent. Regardless, there are no inflammatory changes noted adjacent to the cecum to suggest the  presence of an acute appendicitis at this time. Vascular/Lymphatic: Aortic atherosclerosis, without evidence of aneurysm or dissection in the abdominal or pelvic vasculature. Vascular stents are noted in the common iliac arteries bilaterally which appear grossly patent. Retroaortic left renal vein (normal anatomical variant) incidentally noted. No lymphadenopathy noted in the abdomen or pelvis. Reproductive: Prostate gland and seminal vesicles are unremarkable in appearance. Other: No significant volume of ascites.  No pneumoperitoneum. Musculoskeletal: There are no aggressive appearing lytic or blastic lesions noted in the visualized portions of the skeleton. Review of the MIP images confirms the above  findings. IMPRESSION: 1. No evidence of pulmonary embolism. 2. Known metastatic lesion the left scapula demonstrates some interval increase in sclerosis suggesting a positive response to therapy. There appears to be some evolving postradiation changes in the lungs, as above. 3. No new sites of metastatic disease noted elsewhere in the chest, abdomen or pelvis. 4. Diffuse bronchial wall thickening with moderate to severe centrilobular and paraseptal emphysema; imaging findings suggestive of underlying COPD. 5. Aortic atherosclerosis, in addition to left main and 2 vessel coronary artery disease. Please note that although the presence of coronary artery calcium documents the presence of coronary artery disease, the severity of this disease and any potential stenosis cannot be assessed on this non-gated CT examination. Assessment for potential risk factor modification, dietary therapy or pharmacologic therapy may be warranted, if clinically indicated. 6. There are calcifications of the mitral annulus. Echocardiographic correlation for evaluation of potential valvular dysfunction may be warranted if clinically indicated. 7. Additional incidental findings, as above. Electronically Signed   By: Vinnie Langton M.D.   On: 03/01/2021 11:22   CT ABDOMEN PELVIS W CONTRAST  Result Date: 03/01/2021 CLINICAL DATA:  65 year old male with history of chest pain. History of lung cancer with metastatic disease to the left shoulder blade. EXAM: CT ANGIOGRAPHY CHEST CT ABDOMEN AND PELVIS WITH CONTRAST TECHNIQUE: Multidetector CT imaging of the chest was performed using the standard protocol during bolus administration of intravenous contrast. Multiplanar CT image reconstructions and MIPs were obtained to evaluate the vascular anatomy. Multidetector CT imaging of the abdomen and pelvis was performed using the standard protocol during bolus administration of intravenous contrast. CONTRAST:  133mL OMNIPAQUE IOHEXOL 350 MG/ML SOLN  COMPARISON:  CT the chest, abdomen and pelvis 01/05/2021. FINDINGS: CTA CHEST FINDINGS Cardiovascular: No filling defects within the pulmonary arterial tree to suggest pulmonary embolism. Heart size is normal. There is no significant pericardial fluid, thickening or pericardial calcification. There is aortic atherosclerosis, as well as atherosclerosis of the great vessels of the mediastinum and the coronary arteries, including calcified atherosclerotic plaque in the left main, left anterior descending and right coronary arteries. Mild calcifications of the mitral annulus. Right internal jugular single-lumen porta cath with tip terminating at the superior cavoatrial junction. Mediastinum/Nodes: No pathologically enlarged mediastinal or hilar lymph nodes. Esophagus is unremarkable in appearance. No axillary lymphadenopathy. Esophagus is unremarkable in appearance. No axillary lymphadenopathy. Lungs/Pleura: Status post right upper lobectomy with compensatory hyperexpansion of the right middle and lower lobes. New areas of ground-glass attenuation, consolidation, septal thickening and architectural distortion in the apex of the left upper lobe and to a lesser extent in the posterior aspect of the left lower lobe, likely to reflect evolving postradiation changes related to therapy directed at the left scapula and left supraclavicular lymph nodes. No definite suspicious appearing pulmonary nodules or masses are otherwise noted. No pleural effusions. Diffuse bronchial wall thickening with moderate to severe centrilobular and paraseptal emphysema. Musculoskeletal: Lytic  lesion in the left scapula, increasingly sclerotic compared to the prior study, suggesting some interval bony healing from positive response to therapy. No other new aggressive appearing lytic or blastic lesions are noted elsewhere in the visualized portions of the skeleton. Review of the MIP images confirms the above findings. CT ABDOMEN and PELVIS FINDINGS  Hepatobiliary: No suspicious cystic or solid hepatic lesions. No intra or extrahepatic biliary ductal dilatation. Gallbladder is normal in appearance. Pancreas: No pancreatic mass. No pancreatic ductal dilatation. No pancreatic or peripancreatic fluid collections or inflammatory changes. Spleen: Unremarkable. Adrenals/Urinary Tract: Bilateral kidneys and bilateral adrenal glands are normal in appearance. No hydroureteronephrosis. Urinary bladder is normal in appearance. Stomach/Bowel: The appearance of the stomach is normal. There is no pathologic dilatation of small bowel or colon. The appendix is not confidently identified and may be surgically absent. Regardless, there are no inflammatory changes noted adjacent to the cecum to suggest the presence of an acute appendicitis at this time. Vascular/Lymphatic: Aortic atherosclerosis, without evidence of aneurysm or dissection in the abdominal or pelvic vasculature. Vascular stents are noted in the common iliac arteries bilaterally which appear grossly patent. Retroaortic left renal vein (normal anatomical variant) incidentally noted. No lymphadenopathy noted in the abdomen or pelvis. Reproductive: Prostate gland and seminal vesicles are unremarkable in appearance. Other: No significant volume of ascites.  No pneumoperitoneum. Musculoskeletal: There are no aggressive appearing lytic or blastic lesions noted in the visualized portions of the skeleton. Review of the MIP images confirms the above findings. IMPRESSION: 1. No evidence of pulmonary embolism. 2. Known metastatic lesion the left scapula demonstrates some interval increase in sclerosis suggesting a positive response to therapy. There appears to be some evolving postradiation changes in the lungs, as above. 3. No new sites of metastatic disease noted elsewhere in the chest, abdomen or pelvis. 4. Diffuse bronchial wall thickening with moderate to severe centrilobular and paraseptal emphysema; imaging findings  suggestive of underlying COPD. 5. Aortic atherosclerosis, in addition to left main and 2 vessel coronary artery disease. Please note that although the presence of coronary artery calcium documents the presence of coronary artery disease, the severity of this disease and any potential stenosis cannot be assessed on this non-gated CT examination. Assessment for potential risk factor modification, dietary therapy or pharmacologic therapy may be warranted, if clinically indicated. 6. There are calcifications of the mitral annulus. Echocardiographic correlation for evaluation of potential valvular dysfunction may be warranted if clinically indicated. 7. Additional incidental findings, as above. Electronically Signed   By: Vinnie Langton M.D.   On: 03/01/2021 11:22    Microbiology: Recent Results (from the past 240 hour(s))  Blood culture (routine x 2)     Status: None (Preliminary result)   Collection Time: 03/01/21  4:25 PM   Specimen: BLOOD  Result Value Ref Range Status   Specimen Description BLOOD SITE NOT SPECIFIED  Final   Special Requests   Final    BOTTLES DRAWN AEROBIC AND ANAEROBIC Blood Culture adequate volume   Culture   Final    NO GROWTH < 24 HOURS Performed at [facility], [address], [place], [place] [address]    Report Status PENDING  Incomplete  Blood culture (routine x 2)     Status: None (Preliminary result)   Collection Time: 03/01/21  4:29 PM   Specimen: BLOOD  Result Value Ref Range Status   Specimen Description BLOOD LEFT ANTECUBITAL  Final   Special Requests   Final    BOTTLES DRAWN AEROBIC  AND ANAEROBIC Blood Culture adequate volume   Culture   Final    NO GROWTH < 24 HOURS Performed at [facility], [place] [address] [place], [place] [address]    Report Status PENDING  Incomplete  Resp Panel by RT-PCR (Flu A&B, Covid) Nasopharyngeal Swab     Status: None   Collection Time: 03/01/21  4:29 PM   Specimen: Nasopharyngeal Swab; Nasopharyngeal(NP) swabs  in vial transport medium  Result Value Ref Range Status   SARS Coronavirus 2 by RT PCR NEGATIVE NEGATIVE Final    Comment: (NOTE) SARS-CoV-2 target nucleic acids are NOT DETECTED.  The SARS-CoV-2 RNA is generally detectable in upper respiratory specimens during the acute phase of infection. The lowest concentration of SARS-CoV-2 viral copies this assay can detect is 138 copies/mL. A negative result does not preclude SARS-Cov-2 infection and should not be used as the sole basis for treatment or other patient management decisions. A negative result may occur with  improper specimen collection/handling, submission of specimen other than nasopharyngeal swab, presence of viral mutation(s) within the areas targeted by this assay, and inadequate number of viral copies(<138 copies/mL). A negative result must be combined with clinical observations, patient history, and epidemiological information. The expected result is Negative.  Fact Sheet for Patients:  EntrepreneurPulse.com.au  Fact Sheet for Healthcare Providers:  IncredibleEmployment.be  This test is no t yet approved or cleared by the [place] FDA and  has been authorized for detection and/or diagnosis of SARS-CoV-2 by FDA under an Emergency Use Authorization (EUA). This EUA will remain  in effect (meaning this test can be used) for the duration of the COVID-19 declaration under Section 564(b)(1) of the Act, 21 U.S.C.section 360bbb-3(b)(1), unless the authorization is terminated  or revoked sooner.       Influenza A by PCR NEGATIVE NEGATIVE Final   Influenza B by PCR NEGATIVE NEGATIVE Final    Comment: (NOTE) The Xpert Xpress SARS-CoV-2/FLU/RSV plus assay is intended as an aid in the diagnosis of influenza from Nasopharyngeal swab specimens and should not be used as a sole basis for treatment. Nasal washings and aspirates are unacceptable for Xpert Xpress  SARS-CoV-2/FLU/RSV testing.  Fact Sheet for Patients: EntrepreneurPulse.com.au  Fact Sheet for Healthcare Providers: IncredibleEmployment.be  This test is not yet approved or cleared by the [place] FDA and has been authorized for detection and/or diagnosis of SARS-CoV-2 by FDA under an Emergency Use Authorization (EUA). This EUA will remain in effect (meaning this test can be used) for the duration of the COVID-19 declaration under Section 564(b)(1) of the Act, 21 U.S.C. section 360bbb-3(b)(1), unless the authorization is terminated or revoked.  Performed at [facility], [place] [address], [place], [place] [address]      Labs: Basic Metabolic Panel: Recent Labs  Lab 03/01/21 1111 03/01/21 2046 03/01/21 2330  NA 126*  --  127*  K 3.1*  --  4.1  CL 90*  --  93*  CO2 26  --  26  GLUCOSE 161*  --  99  BUN 9  --  10  CREATININE 0.94  --  1.01  CALCIUM 9.1  --  8.5*  MG  --  1.2*  --    Liver Function Tests: Recent Labs  Lab 03/01/21 1111  AST 52*  ALT 36  ALKPHOS 57  BILITOT 0.6  PROT 6.4*  ALBUMIN 3.1*   No results for input(s): LIPASE, AMYLASE in the last 168 hours. No results for input(s): AMMONIA in the  last 168 hours. CBC: Recent Labs  Lab 03/01/21 1111 03/01/21 2330  WBC 4.2 6.7  HGB 8.1* 7.4*  HCT 23.3* 21.2*  MCV 108.4* 108.7*  PLT 198 172   Cardiac Enzymes: No results for input(s): CKTOTAL, CKMB, CKMBINDEX, TROPONINI in the last 168 hours. BNP: BNP (last 3 results) No results for input(s): BNP in the last 8760 hours.  ProBNP (last 3 results) No results for input(s): PROBNP in the last 8760 hours.  CBG: No results for input(s): GLUCAP in the last 168 hours.     Signed:  Nita Sells MD   [facility] 03/02/2021, 4:51 PM  I just saw him we can do a

## 2021-03-02 NOTE — Progress Notes (Signed)
Initial Nutrition Assessment  DOCUMENTATION CODES:   Not applicable  INTERVENTION:   -Ensure Enlive po QID, each supplement provides 350 kcal and 20 grams of protein  -MVI with minerals daily  NUTRITION DIAGNOSIS:   Increased nutrient needs related to cancer and cancer related treatments as evidenced by estimated needs.  GOAL:   Patient will meet greater than or equal to 90% of their needs  MONITOR:   PO intake, Supplement acceptance, Labs, Weight trends, Skin, I & O's  REASON FOR ASSESSMENT:   Malnutrition Screening Tool    ASSESSMENT:   Sean Rose is a 65 y.o. male with medical history significant of PVD, CAD, HTN, HLD, adenocarcinoma of right lung stage 4 with mets into his shoulder blade, COPD,  who presented to ER with severe abdominal pain from lower stomach to his chest.  Pt admitted with intractable nausea and vomiting.   Reviewed I/O's: +2.4 L x 24 hours  UOP: 350 ml x 24 hours  Spoke with pt at bedside, who reports feeling better today. He reports that he usually has a fair appetite consumes 3 meals per day (Breakfast: grits, eggs, and toast; Lunch and Dinner: meat, starch, and vegetable) as well as 2 Ensure supplements daily. Breakfast tray was unattempted. Per pt, he does not eat the hospital food, as he had a very poor experience with the food at a previous hospitalization ("it all tastes bad; even the orange juice has a funny taste"). He shares that his wife usually brings outside food for him to consume. RD provided pt with a chocolate Ensure per his request.   Per pt, his UBW is around 145#, but estimates he has lost about 11 pounds over the past month.   Discussed importance of good meal and supplement intake to promote healing.   Medications reviewed folic acid, lasix, and magnesium oxide.   Labs reviewed: Na: 127. Mg: 1.2.  NUTRITION - FOCUSED PHYSICAL EXAM:  Flowsheet Row Most Recent Value  Orbital Region No depletion  Upper Arm Region Mild  depletion  Thoracic and Lumbar Region No depletion  Buccal Region No depletion  Temple Region No depletion  Clavicle Bone Region No depletion  Clavicle and Acromion Bone Region No depletion  Scapular Bone Region No depletion  Dorsal Hand No depletion  Patellar Region Mild depletion  Anterior Thigh Region Mild depletion  Posterior Calf Region Mild depletion  Edema (RD Assessment) None  Hair Reviewed  Eyes Reviewed  Mouth Reviewed  Skin Reviewed  Nails Reviewed       Diet Order:   Diet Order             DIET SOFT Room service appropriate? Yes; Fluid consistency: Thin  Diet effective now                   EDUCATION NEEDS:   Education needs have been addressed  Skin:  Skin Assessment: Reviewed RN Assessment  Last BM:  03/02/21  Height:   Ht Readings from Last 1 Encounters:  02/19/21 5' 8.5" (1.74 m)    Weight:   Wt Readings from Last 1 Encounters:  02/19/21 61 kg    Ideal Body Weight:  71.4 kg  BMI:  There is no height or weight on file to calculate BMI.  Estimated Nutritional Needs:   Kcal:  3299-2426  Protein:  90-105 grams  Fluid:  > 1.7 L    Loistine Chance, RD, LDN, [place] Registered Dietitian II Certified Diabetes Care and Education Specialist Please refer  to [facility] for RD and/or RD on-call/weekend/after hours pager

## 2021-03-03 LAB — TYPE AND SCREEN
ABO/RH(D): A POS
Antibody Screen: NEGATIVE
Unit division: 0
Unit division: 0

## 2021-03-03 LAB — BPAM RBC
Blood Product Expiration Date: 202209032359
Blood Product Expiration Date: 202209032359
ISSUE DATE / TIME: 202208051210
ISSUE DATE / TIME: 202208051554
Unit Type and Rh: 6200
Unit Type and Rh: 6200

## 2021-03-05 ENCOUNTER — Telehealth: Payer: Self-pay | Admitting: Medical Oncology

## 2021-03-05 ENCOUNTER — Other Ambulatory Visit: Payer: Self-pay | Admitting: Medical Oncology

## 2021-03-05 NOTE — Telephone Encounter (Signed)
Pt had scans in ED recently . Please cancel CT scans ordered by Cassie.

## 2021-03-06 LAB — CULTURE, BLOOD (ROUTINE X 2)
Culture: NO GROWTH
Culture: NO GROWTH
Special Requests: ADEQUATE
Special Requests: ADEQUATE

## 2021-03-06 NOTE — Telephone Encounter (Signed)
I spoke with pt and advised he does not need an additional scan before his next visit. Pt expressed understanding of this information.

## 2021-03-12 ENCOUNTER — Ambulatory Visit: Payer: BC Managed Care – PPO | Admitting: Internal Medicine

## 2021-03-12 ENCOUNTER — Ambulatory Visit: Payer: BC Managed Care – PPO

## 2021-03-12 ENCOUNTER — Other Ambulatory Visit: Payer: BC Managed Care – PPO

## 2021-03-13 ENCOUNTER — Inpatient Hospital Stay: Payer: BC Managed Care – PPO | Attending: Internal Medicine

## 2021-03-13 ENCOUNTER — Inpatient Hospital Stay (HOSPITAL_BASED_OUTPATIENT_CLINIC_OR_DEPARTMENT_OTHER): Payer: BC Managed Care – PPO | Admitting: Internal Medicine

## 2021-03-13 ENCOUNTER — Other Ambulatory Visit: Payer: Self-pay

## 2021-03-13 VITALS — BP 142/99 | HR 81 | Temp 97.2°F | Resp 19 | Ht 68.5 in | Wt 134.8 lb

## 2021-03-13 DIAGNOSIS — C7951 Secondary malignant neoplasm of bone: Secondary | ICD-10-CM | POA: Insufficient documentation

## 2021-03-13 DIAGNOSIS — Z902 Acquired absence of lung [part of]: Secondary | ICD-10-CM | POA: Insufficient documentation

## 2021-03-13 DIAGNOSIS — T451X5A Adverse effect of antineoplastic and immunosuppressive drugs, initial encounter: Secondary | ICD-10-CM | POA: Diagnosis not present

## 2021-03-13 DIAGNOSIS — Z5111 Encounter for antineoplastic chemotherapy: Secondary | ICD-10-CM | POA: Insufficient documentation

## 2021-03-13 DIAGNOSIS — C3491 Malignant neoplasm of unspecified part of right bronchus or lung: Secondary | ICD-10-CM

## 2021-03-13 DIAGNOSIS — Z5112 Encounter for antineoplastic immunotherapy: Secondary | ICD-10-CM | POA: Diagnosis not present

## 2021-03-13 DIAGNOSIS — Z79899 Other long term (current) drug therapy: Secondary | ICD-10-CM | POA: Diagnosis not present

## 2021-03-13 DIAGNOSIS — C770 Secondary and unspecified malignant neoplasm of lymph nodes of head, face and neck: Secondary | ICD-10-CM | POA: Diagnosis not present

## 2021-03-13 DIAGNOSIS — D6481 Anemia due to antineoplastic chemotherapy: Secondary | ICD-10-CM | POA: Insufficient documentation

## 2021-03-13 DIAGNOSIS — C3411 Malignant neoplasm of upper lobe, right bronchus or lung: Secondary | ICD-10-CM | POA: Insufficient documentation

## 2021-03-13 DIAGNOSIS — Z95828 Presence of other vascular implants and grafts: Secondary | ICD-10-CM

## 2021-03-13 LAB — CMP (CANCER CENTER ONLY)
ALT: 29 U/L (ref 0–44)
AST: 40 U/L (ref 15–41)
Albumin: 3.3 g/dL — ABNORMAL LOW (ref 3.5–5.0)
Alkaline Phosphatase: 139 U/L — ABNORMAL HIGH (ref 38–126)
Anion gap: 10 (ref 5–15)
BUN: 6 mg/dL — ABNORMAL LOW (ref 8–23)
CO2: 25 mmol/L (ref 22–32)
Calcium: 9.2 mg/dL (ref 8.9–10.3)
Chloride: 95 mmol/L — ABNORMAL LOW (ref 98–111)
Creatinine: 0.93 mg/dL (ref 0.61–1.24)
GFR, Estimated: >60 mL/min
Glucose, Bld: 137 mg/dL — ABNORMAL HIGH (ref 70–99)
Potassium: 3.4 mmol/L — ABNORMAL LOW (ref 3.5–5.1)
Sodium: 130 mmol/L — ABNORMAL LOW (ref 135–145)
Total Bilirubin: 0.6 mg/dL (ref 0.3–1.2)
Total Protein: 7.1 g/dL (ref 6.5–8.1)

## 2021-03-13 LAB — CBC WITH DIFFERENTIAL (CANCER CENTER ONLY)
Abs Immature Granulocytes: 0.03 10*3/uL (ref 0.00–0.07)
Basophils Absolute: 0 10*3/uL (ref 0.0–0.1)
Basophils Relative: 1 %
Eosinophils Absolute: 0.1 10*3/uL (ref 0.0–0.5)
Eosinophils Relative: 1 %
HCT: 32.3 % — ABNORMAL LOW (ref 39.0–52.0)
Hemoglobin: 11.3 g/dL — ABNORMAL LOW (ref 13.0–17.0)
Immature Granulocytes: 1 %
Lymphocytes Relative: 30 %
Lymphs Abs: 1.7 10*3/uL (ref 0.7–4.0)
MCH: 36.1 pg — ABNORMAL HIGH (ref 26.0–34.0)
MCHC: 35 g/dL (ref 30.0–36.0)
MCV: 103.2 fL — ABNORMAL HIGH (ref 80.0–100.0)
Monocytes Absolute: 0.9 10*3/uL (ref 0.1–1.0)
Monocytes Relative: 15 %
Neutro Abs: 3.1 10*3/uL (ref 1.7–7.7)
Neutrophils Relative %: 52 %
Platelet Count: 466 10*3/uL — ABNORMAL HIGH (ref 150–400)
RBC: 3.13 MIL/uL — ABNORMAL LOW (ref 4.22–5.81)
RDW: 20.3 % — ABNORMAL HIGH (ref 11.5–15.5)
WBC Count: 5.8 10*3/uL (ref 4.0–10.5)
nRBC: 0 % (ref 0.0–0.2)

## 2021-03-13 MED ORDER — SODIUM CHLORIDE 0.9% FLUSH
10.0000 mL | Freq: Once | INTRAVENOUS | Status: AC
  Administered 2021-03-13: 10 mL

## 2021-03-13 MED ORDER — HEPARIN SOD (PORK) LOCK FLUSH 100 UNIT/ML IV SOLN
500.0000 [IU] | Freq: Once | INTRAVENOUS | Status: AC
  Administered 2021-03-13: 500 [IU]

## 2021-03-13 NOTE — Progress Notes (Signed)
[place] [phone]   Fax:([phone]  OFFICE PROGRESS NOTE  [name], NP [place] [address]  DIAGNOSIS: Metastatic non-small cell lung cancer initially diagnosed as stage IIIA (T3, N1, M0) non-small cell lung cancer, adenocarcinoma with no actionable mutations presented with multiple pulmonary nodules in the right upper lobe as well as metastatic disease in intraparenchymal lymphadenopathy.  This was diagnosed in February 2020.  The patient has disease recurrence in March 2022. PD-L1 expression 20%. He has no actionable mutations by foundation 1.  PRIOR THERAPY: 1) Status post right upper lobectomy with lymph node dissection. 2) Adjuvant systemic chemotherapy with cisplatin 75 mg/M2 and Alimta 500 mg/M2 every 3 weeks.  Status post 4 cycles. 3) palliative radiotherapy to the metastatic bone disease and the scapula as well as the left supraclavicular area under the care of Dr. Isidore Moos  CURRENT THERAPY: Systemic chemotherapy with carboplatin for AUC of 5, Alimta 500 mg/M2 and Keytruda 200 mg IV every 3 weeks.  First dose November 06, 2020.  Status post 6 cycles.  Starting from cycle #5 the patient is treated with maintenance Alimta and Keytruda every 3 weeks  INTERVAL HISTORY: Sean Rose 65 y.o. male returns to the clinic today for follow-up visit.  The patient is feeling much better today except for mild fatigue.  He was admitted to the hospital on March 01, 2021 complaining of severe abdominal pain, nausea and vomiting as well as diarrhea.  He was found to have severe anemia with hemoglobin down to 7.4 with lactic acidosis of 2.5.  The patient received 2 units of PRBCs transfusion during his hospitalization and he felt much better.  His symptoms improved quickly and the patient was discharged home the next day.  He actually was able to go to the beach for a week and enjoyed his time there.  He had repeat CT scan of the chest,  abdomen pelvis during his hospitalization.  He has no current nausea, vomiting, diarrhea or constipation.  He has no chest pain, shortness of breath, cough or hemoptysis.  He has no headache or visual changes.  He is here today for evaluation and discussion of his scan results and recommendation regarding his condition.  MEDICAL HISTORY: Past Medical History:  Diagnosis Date   Anginal pain ([facility])    CAD (coronary artery disease)    CAP (community acquired pneumonia) 09/2016   COPD (chronic obstructive pulmonary disease) ([facility])    GERD (gastroesophageal reflux disease)    Heart murmur    "I was told I had one when I was a kid"   Hemorrhoids    History of anal fissures    "no surgeries" (10/30/2016)   Hyperlipidemia    Hypertension    lung ca dx'd 08/2018   Peripheral arterial disease ([facility])    status post right common iliac artery stenting back in 2007   Seasonal allergies    Tobacco abuse     ALLERGIES:  is allergic to compazine [prochlorperazine].  MEDICATIONS:  Current Outpatient Medications  Medication Sig Dispense Refill   aspirin EC 81 MG tablet Take 81 mg by mouth daily.     cetirizine (ZYRTEC) 10 MG tablet Take 10 mg by mouth at bedtime as needed for allergies.     clopidogrel (PLAVIX) 75 MG tablet TAKE 1 TABLET BY MOUTH EVERY DAY 90 tablet 3   esomeprazole (NEXIUM) 20 MG capsule Take 40 mg by mouth daily.     folic  acid (FOLVITE) 1 MG tablet Take 1 tablet (1 mg total) by mouth daily. 30 tablet 4   lidocaine-prilocaine (EMLA) cream Apply to the Port-A-Cath site 30-60 minutes before treatment. 30 g 0   Magnesium 500 MG TABS Take 1 tablet (500 mg total) by mouth 3 (three) times daily. 8 tablet 0   Multiple Vitamin (MULTIVITAMIN WITH MINERALS) TABS tablet Take 1 tablet by mouth daily.     nitroGLYCERIN (NITROSTAT) 0.4 MG SL tablet PLACE 1 TABLET UNDER THE TONGUE EVERY 5 MINUTES AS NEEDED FOR CHEST PAIN. 25 tablet 6   ondansetron (ZOFRAN) 8 MG tablet Take 1 tablet (8 mg total) by  mouth every 8 (eight) hours as needed for nausea or vomiting. 20 tablet 0   rosuvastatin (CRESTOR) 5 MG tablet Take 1 tablet (5 mg total) by mouth every other day. 45 tablet 3   umeclidinium-vilanterol (ANORO ELLIPTA) 62.5-25 MCG/INH AEPB Inhale 1 puff into the lungs daily. 60 each 5   No current facility-administered medications for this visit.    SURGICAL HISTORY:  Past Surgical History:  Procedure Laterality Date   ANKLE SURGERY Left    "rebuilt it"   ANTERIOR CRUCIATE LIGAMENT REPAIR Right    CARDIAC CATHETERIZATION  09/23/2008   Continued medical therapy - may need GI evaluation in addition.   CARDIAC CATHETERIZATION  10/28/2007   Medical therapy recommended.   CARDIAC CATHETERIZATION  11/18/2006   In-stent restenosis RCA  (50% distal edge, 80% segmental mid, and 50-60% segmental proximal). Successful cutting balloon atherectomy using a [id number] cutting balloon. 3 inflations with atherectomy performed on mid and proximal portions resulting in reduction of 80% mid in-stent restenosis to less than 20% residual and 50-60% segmental proximal to less than 20% residual without dissection.   CARDIAC CATHETERIZATION  02/26/2006   Severe stenosis in RCA. Stenting performed using IVUS. 3.5x20 Maverick balloon deployed at [employer]. Distal stent-a 4x28 Liberte stent-deployed 12atm 48sec, 12atm 31sec, 4atm 19sec. Mid stent-a 4x28 Liberte stent-deployed 14atm 45sec, 14atm 60sec, 14atm 44sec. Proximal stent-4x8 Liberte- 14atm 45sec,14atm 47sec, 16atm 43sec. Severely diseased segment then appeared TIMI-3 flow.   CARDIOVASCULAR STRESS TEST  11/17/2012   No significant ECG changes. Septal perfusion defect is new when complared to study from 2010. Abnormal myocardial perfusion imaging with a basal to mid perfusion suggestive of previous MI.   CAROTID DOPPLER  08/09/2011   Bilateral Bulb/Proximal ICA - demonstrated a mild amount of fibrous plaque without evidence of significant diameter reduction reduction or other  vascular abnormality.   CHEST TUBE INSERTION Right 09/02/2018   Procedure: Chest Tube Insertion;  Surgeon: Garner Nash, DO;  Location: [facility];  Service: Thoracic;  Laterality: Right;   COLONOSCOPY     2003, 2014   CORONARY ANGIOPLASTY WITH STENT PLACEMENT     FEMORAL ARTERY STENT     INGUINAL HERNIA REPAIR Right    IR IMAGING GUIDED PORT INSERTION  11/13/2020   KNEE ARTHROSCOPY Right "multiple"   LAPAROSCOPIC APPENDECTOMY N/A 12/07/2018   Procedure: APPENDECTOMY LAPAROSCOPIC;  Surgeon: Coralie Keens, MD;  Location: [facility];  Service: General;  Laterality: N/A;   LOWER EXTREMITY ARTERIAL DOPPLER  01/31/2011   Bilateral ABIs-normal values with no suggestion of arterial insuff to the lower extremities at rest. Right CIA stent-mild amount of nonhemodynamically significant plaque is noted throughout   [place]   "bone-eating tumor"   PERCUTANEOUS STENT INTERVENTION  04/04/2006 & 04/13/2015   a. Right common iliac artery with an 8.0x18 mm Herculink stent deployed at  12 atm. Stenosis was reduced from 80% to 0% with brisk flow. b. I-cast stenting to left common iliac artery   PERIPHERAL VASCULAR CATHETERIZATION N/A 04/13/2015   Procedure: Lower Extremity Angiography;  Surgeon: Lorretta Harp, MD; L-oCIA 75%, 40-50% L-EIA, R-CIA stent patent, s/p 8 mm x 38 mm ICast covered stent>>0% stenosis in [place] Right    TRANSTHORACIC ECHOCARDIOGRAM  11/26/2012   EF not noted. Aortic valve-sclerosis without stenosis, no regurgiation.    UPPER GASTROINTESTINAL ENDOSCOPY     US CAROTID DOPPLER BILATERAL ([facility])  08/09/2011   Bilateral Bulb/Proximal ICAa demonstrated a mild amount of fibrous plaque without evidence of significant diameter reduction or any other vascular abnormality.   VIDEO ASSISTED THORACOSCOPY (VATS)/ LOBECTOMY Right 09/14/2018   Procedure: RIGHT VIDEO ASSISTED THORACOSCOPY (VATS)/ RIGHT UPPER LOBECTOMY;  Surgeon: Melrose Nakayama, MD;  Location: [facility];  Service: Thoracic;  Laterality: Right;   VIDEO BRONCHOSCOPY WITH ENDOBRONCHIAL NAVIGATION N/A 09/02/2018   Procedure: VIDEO BRONCHOSCOPY WITH ENDOBRONCHIAL NAVIGATION;  Surgeon: Garner Nash, DO;  Location: [facility];  Service: Thoracic;  Laterality: N/A;    REVIEW OF SYSTEMS:  Constitutional: positive for fatigue Eyes: negative Ears, nose, mouth, throat, and face: negative Respiratory: positive for dyspnea on exertion Cardiovascular: negative Gastrointestinal: negative Genitourinary:negative Integument/breast: negative Hematologic/lymphatic: negative Musculoskeletal:negative Neurological: negative Behavioral/Psych: negative Endocrine: negative Allergic/Immunologic: negative   PHYSICAL EXAMINATION: General appearance: alert, cooperative, fatigued, and no distress Head: Normocephalic, without obvious abnormality, atraumatic Neck: no adenopathy, no JVD, supple, symmetrical, trachea midline, and thyroid not enlarged, symmetric, no tenderness/mass/nodules Lymph nodes: Cervical, supraclavicular, and axillary nodes normal. Resp: clear to auscultation bilaterally Back: symmetric, no curvature. ROM normal. No CVA tenderness. Cardio: regular rate and rhythm, S1, S2 normal, no murmur, click, rub or gallop GI: soft, non-tender; bowel sounds normal; no masses,  no organomegaly Extremities: extremities normal, atraumatic, no cyanosis or edema Neurologic: Alert and oriented X 3, normal strength and tone. Normal symmetric reflexes. Normal coordination and gait  ECOG PERFORMANCE STATUS: 1 - Symptomatic but completely ambulatory  Blood pressure (!) 142/99, pulse 81, temperature (!) 97.2 F (36.2 C), temperature source Tympanic, resp. rate 19, height 5' 8.5" (1.74 m), weight 134 lb 12.8 oz (61.1 kg), SpO2 99 %.  LABORATORY DATA: Lab Results  Component Value Date   WBC 5.8 03/13/2021   HGB 11.3 (L) 03/13/2021   HCT 32.3 (L) 03/13/2021   MCV 103.2 (H) 03/13/2021    PLT 466 (H) 03/13/2021      Chemistry      Component Value Date/Time   NA 127 (L) 03/01/2021 2330   NA 130 (L) 03/05/2017 1110   K 4.1 03/01/2021 2330   CL 93 (L) 03/01/2021 2330   CO2 26 03/01/2021 2330   BUN 10 03/01/2021 2330   BUN 4 (L) 03/05/2017 1110   CREATININE 1.01 03/01/2021 2330   CREATININE 0.82 02/19/2021 1041   CREATININE 0.76 04/28/2020 0826      Component Value Date/Time   CALCIUM 8.5 (L) 03/01/2021 2330   ALKPHOS 57 03/01/2021 1111   AST 52 (H) 03/01/2021 1111   AST 31 02/19/2021 1041   ALT 36 03/01/2021 1111   ALT 23 02/19/2021 1041   BILITOT 0.6 03/01/2021 1111   BILITOT 0.5 02/19/2021 1041       RADIOGRAPHIC STUDIES: DG Chest 2 View  Result Date: 03/01/2021 CLINICAL DATA:  Chest pain. EXAM: CHEST - 2 VIEW COMPARISON:  CT 01/05/2021.  Chest  x-ray 09/28/2019. FINDINGS: PowerPort catheter noted with tip over SVC. Heart size normal. Surgical sutures right upper chest. Lungs are clear. No pleural effusion or pneumothorax. Degenerative change and scoliosis thoracic spine. Left scapular lesion best identified by prior CT. IMPRESSION: 1.  PowerPort catheter with tip over SVC. 2. Postsurgical changes right lung. No acute cardiopulmonary disease identified. 3.  Left scapular lesion best identified by prior CT. Electronically Signed   By: Marcello Moores  Register   On: 03/01/2021 09:23   CT Angio Chest PE W and/or Wo Contrast  Result Date: 03/01/2021 CLINICAL DATA:  65 year old male with history of chest pain. History of lung cancer with metastatic disease to the left shoulder blade. EXAM: CT ANGIOGRAPHY CHEST CT ABDOMEN AND PELVIS WITH CONTRAST TECHNIQUE: Multidetector CT imaging of the chest was performed using the standard protocol during bolus administration of intravenous contrast. Multiplanar CT image reconstructions and MIPs were obtained to evaluate the vascular anatomy. Multidetector CT imaging of the abdomen and pelvis was performed using the standard protocol during  bolus administration of intravenous contrast. CONTRAST:  139m OMNIPAQUE IOHEXOL 350 MG/ML SOLN COMPARISON:  CT the chest, abdomen and pelvis 01/05/2021. FINDINGS: CTA CHEST FINDINGS Cardiovascular: No filling defects within the pulmonary arterial tree to suggest pulmonary embolism. Heart size is normal. There is no significant pericardial fluid, thickening or pericardial calcification. There is aortic atherosclerosis, as well as atherosclerosis of the great vessels of the mediastinum and the coronary arteries, including calcified atherosclerotic plaque in the left main, left anterior descending and right coronary arteries. Mild calcifications of the mitral annulus. Right internal jugular single-lumen porta cath with tip terminating at the superior cavoatrial junction. Mediastinum/Nodes: No pathologically enlarged mediastinal or hilar lymph nodes. Esophagus is unremarkable in appearance. No axillary lymphadenopathy. Esophagus is unremarkable in appearance. No axillary lymphadenopathy. Lungs/Pleura: Status post right upper lobectomy with compensatory hyperexpansion of the right middle and lower lobes. New areas of ground-glass attenuation, consolidation, septal thickening and architectural distortion in the apex of the left upper lobe and to a lesser extent in the posterior aspect of the left lower lobe, likely to reflect evolving postradiation changes related to therapy directed at the left scapula and left supraclavicular lymph nodes. No definite suspicious appearing pulmonary nodules or masses are otherwise noted. No pleural effusions. Diffuse bronchial wall thickening with moderate to severe centrilobular and paraseptal emphysema. Musculoskeletal: Lytic lesion in the left scapula, increasingly sclerotic compared to the prior study, suggesting some interval bony healing from positive response to therapy. No other new aggressive appearing lytic or blastic lesions are noted elsewhere in the visualized portions of  the skeleton. Review of the MIP images confirms the above findings. CT ABDOMEN and PELVIS FINDINGS Hepatobiliary: No suspicious cystic or solid hepatic lesions. No intra or extrahepatic biliary ductal dilatation. Gallbladder is normal in appearance. Pancreas: No pancreatic mass. No pancreatic ductal dilatation. No pancreatic or peripancreatic fluid collections or inflammatory changes. Spleen: Unremarkable. Adrenals/Urinary Tract: Bilateral kidneys and bilateral adrenal glands are normal in appearance. No hydroureteronephrosis. Urinary bladder is normal in appearance. Stomach/Bowel: The appearance of the stomach is normal. There is no pathologic dilatation of small bowel or colon. The appendix is not confidently identified and may be surgically absent. Regardless, there are no inflammatory changes noted adjacent to the cecum to suggest the presence of an acute appendicitis at this time. Vascular/Lymphatic: Aortic atherosclerosis, without evidence of aneurysm or dissection in the abdominal or pelvic vasculature. Vascular stents are noted in the common iliac arteries bilaterally which appear grossly patent. Retroaortic  left renal vein (normal anatomical variant) incidentally noted. No lymphadenopathy noted in the abdomen or pelvis. Reproductive: Prostate gland and seminal vesicles are unremarkable in appearance. Other: No significant volume of ascites.  No pneumoperitoneum. Musculoskeletal: There are no aggressive appearing lytic or blastic lesions noted in the visualized portions of the skeleton. Review of the MIP images confirms the above findings. IMPRESSION: 1. No evidence of pulmonary embolism. 2. Known metastatic lesion the left scapula demonstrates some interval increase in sclerosis suggesting a positive response to therapy. There appears to be some evolving postradiation changes in the lungs, as above. 3. No new sites of metastatic disease noted elsewhere in the chest, abdomen or pelvis. 4. Diffuse bronchial  wall thickening with moderate to severe centrilobular and paraseptal emphysema; imaging findings suggestive of underlying COPD. 5. Aortic atherosclerosis, in addition to left main and 2 vessel coronary artery disease. Please note that although the presence of coronary artery calcium documents the presence of coronary artery disease, the severity of this disease and any potential stenosis cannot be assessed on this non-gated CT examination. Assessment for potential risk factor modification, dietary therapy or pharmacologic therapy may be warranted, if clinically indicated. 6. There are calcifications of the mitral annulus. Echocardiographic correlation for evaluation of potential valvular dysfunction may be warranted if clinically indicated. 7. Additional incidental findings, as above. Electronically Signed   By: Vinnie Langton M.D.   On: 03/01/2021 11:22   CT ABDOMEN PELVIS W CONTRAST  Result Date: 03/01/2021 CLINICAL DATA:  65 year old male with history of chest pain. History of lung cancer with metastatic disease to the left shoulder blade. EXAM: CT ANGIOGRAPHY CHEST CT ABDOMEN AND PELVIS WITH CONTRAST TECHNIQUE: Multidetector CT imaging of the chest was performed using the standard protocol during bolus administration of intravenous contrast. Multiplanar CT image reconstructions and MIPs were obtained to evaluate the vascular anatomy. Multidetector CT imaging of the abdomen and pelvis was performed using the standard protocol during bolus administration of intravenous contrast. CONTRAST:  140m OMNIPAQUE IOHEXOL 350 MG/ML SOLN COMPARISON:  CT the chest, abdomen and pelvis 01/05/2021. FINDINGS: CTA CHEST FINDINGS Cardiovascular: No filling defects within the pulmonary arterial tree to suggest pulmonary embolism. Heart size is normal. There is no significant pericardial fluid, thickening or pericardial calcification. There is aortic atherosclerosis, as well as atherosclerosis of the great vessels of the  mediastinum and the coronary arteries, including calcified atherosclerotic plaque in the left main, left anterior descending and right coronary arteries. Mild calcifications of the mitral annulus. Right internal jugular single-lumen porta cath with tip terminating at the superior cavoatrial junction. Mediastinum/Nodes: No pathologically enlarged mediastinal or hilar lymph nodes. Esophagus is unremarkable in appearance. No axillary lymphadenopathy. Esophagus is unremarkable in appearance. No axillary lymphadenopathy. Lungs/Pleura: Status post right upper lobectomy with compensatory hyperexpansion of the right middle and lower lobes. New areas of ground-glass attenuation, consolidation, septal thickening and architectural distortion in the apex of the left upper lobe and to a lesser extent in the posterior aspect of the left lower lobe, likely to reflect evolving postradiation changes related to therapy directed at the left scapula and left supraclavicular lymph nodes. No definite suspicious appearing pulmonary nodules or masses are otherwise noted. No pleural effusions. Diffuse bronchial wall thickening with moderate to severe centrilobular and paraseptal emphysema. Musculoskeletal: Lytic lesion in the left scapula, increasingly sclerotic compared to the prior study, suggesting some interval bony healing from positive response to therapy. No other new aggressive appearing lytic or blastic lesions are noted elsewhere in the visualized portions  of the skeleton. Review of the MIP images confirms the above findings. CT ABDOMEN and PELVIS FINDINGS Hepatobiliary: No suspicious cystic or solid hepatic lesions. No intra or extrahepatic biliary ductal dilatation. Gallbladder is normal in appearance. Pancreas: No pancreatic mass. No pancreatic ductal dilatation. No pancreatic or peripancreatic fluid collections or inflammatory changes. Spleen: Unremarkable. Adrenals/Urinary Tract: Bilateral kidneys and bilateral adrenal glands  are normal in appearance. No hydroureteronephrosis. Urinary bladder is normal in appearance. Stomach/Bowel: The appearance of the stomach is normal. There is no pathologic dilatation of small bowel or colon. The appendix is not confidently identified and may be surgically absent. Regardless, there are no inflammatory changes noted adjacent to the cecum to suggest the presence of an acute appendicitis at this time. Vascular/Lymphatic: Aortic atherosclerosis, without evidence of aneurysm or dissection in the abdominal or pelvic vasculature. Vascular stents are noted in the common iliac arteries bilaterally which appear grossly patent. Retroaortic left renal vein (normal anatomical variant) incidentally noted. No lymphadenopathy noted in the abdomen or pelvis. Reproductive: Prostate gland and seminal vesicles are unremarkable in appearance. Other: No significant volume of ascites.  No pneumoperitoneum. Musculoskeletal: There are no aggressive appearing lytic or blastic lesions noted in the visualized portions of the skeleton. Review of the MIP images confirms the above findings. IMPRESSION: 1. No evidence of pulmonary embolism. 2. Known metastatic lesion the left scapula demonstrates some interval increase in sclerosis suggesting a positive response to therapy. There appears to be some evolving postradiation changes in the lungs, as above. 3. No new sites of metastatic disease noted elsewhere in the chest, abdomen or pelvis. 4. Diffuse bronchial wall thickening with moderate to severe centrilobular and paraseptal emphysema; imaging findings suggestive of underlying COPD. 5. Aortic atherosclerosis, in addition to left main and 2 vessel coronary artery disease. Please note that although the presence of coronary artery calcium documents the presence of coronary artery disease, the severity of this disease and any potential stenosis cannot be assessed on this non-gated CT examination. Assessment for potential risk factor  modification, dietary therapy or pharmacologic therapy may be warranted, if clinically indicated. 6. There are calcifications of the mitral annulus. Echocardiographic correlation for evaluation of potential valvular dysfunction may be warranted if clinically indicated. 7. Additional incidental findings, as above. Electronically Signed   By: Vinnie Langton M.D.   On: 03/01/2021 11:22    ASSESSMENT AND PLAN: This is a very pleasant 65 years old white male with a stage IIIB non-small cell lung cancer, adenocarcinoma with no actionable mutations status post right upper lobectomy with lymph node dissection under the care of [name]. The patient completed a course of adjuvant treatment with cisplatin and Alimta status post 4 cycles.  He tolerated the previous 4 cycles of his treatment fairly well. The patient has been in observation but the recent imaging studies showed suspicious bone lesion in the left shoulder area. I ordered a PET scan which was performed on 09/25/2020 and it showed metastatic disease involving the left scapula and left supraclavicular lymph nodes. The patient had ultrasound-guided core biopsy of the left supraclavicular lymph node and it was positive for metastatic carcinoma. He underwent palliative radiotherapy to the metastatic bone lesion in the left scapula and he is feeling much better. PD-L1 expression 20%. He was tested in the past for molecular study by foundation 1 and it was reported to be negative for actionable mutations. The patient started systemic chemotherapy with carboplatin for AUC of 5, Alimta 500 Mg/M2 and Keytruda 200 Mg IV every  3 weeks status post 6 cycles.  Starting from cycle #5 he is treated with maintenance Alimta and Keytruda every 3 weeks. The patient has been tolerating his treatment well with no concerning adverse effect except for mild fatigue secondary to chemotherapy-induced anemia. He had repeat CT scan of the chest, abdomen pelvis performed less  than 10 days ago and that showed no concerning findings for disease progression. I recommended for the patient to continue his treatment with maintenance Alimta and Keytruda as planned and he will proceed with cycle #7 today. For the chemotherapy-induced anemia, he received 2 units of PRBCs transfusion during his hospitalization and he is feeling fine today.  We will continue to monitor his hemoglobin and hematocrit closely and consider The patient for transfusion if needed. He will come back for follow-up visit in 3 weeks for evaluation before the next cycle of his treatment. He was advised to call immediately if he has any concerning symptoms in the interval.  The patient voices understanding of current disease status and treatment options and is in agreement with the current care plan. All questions were answered. The patient knows to call the clinic with any problems, questions or concerns. We can certainly see the patient much sooner if necessary.  Disclaimer: This note was dictated with voice recognition software. Similar sounding words can inadvertently be transcribed and may not be corrected upon review.

## 2021-03-14 ENCOUNTER — Inpatient Hospital Stay: Payer: BC Managed Care – PPO

## 2021-03-14 VITALS — BP 119/80 | HR 72 | Temp 98.7°F | Resp 18

## 2021-03-14 DIAGNOSIS — C7951 Secondary malignant neoplasm of bone: Secondary | ICD-10-CM | POA: Diagnosis not present

## 2021-03-14 DIAGNOSIS — Z5112 Encounter for antineoplastic immunotherapy: Secondary | ICD-10-CM | POA: Diagnosis not present

## 2021-03-14 DIAGNOSIS — Z902 Acquired absence of lung [part of]: Secondary | ICD-10-CM | POA: Diagnosis not present

## 2021-03-14 DIAGNOSIS — C770 Secondary and unspecified malignant neoplasm of lymph nodes of head, face and neck: Secondary | ICD-10-CM | POA: Diagnosis not present

## 2021-03-14 DIAGNOSIS — Z5111 Encounter for antineoplastic chemotherapy: Secondary | ICD-10-CM | POA: Diagnosis not present

## 2021-03-14 DIAGNOSIS — C3411 Malignant neoplasm of upper lobe, right bronchus or lung: Secondary | ICD-10-CM | POA: Diagnosis not present

## 2021-03-14 DIAGNOSIS — Z79899 Other long term (current) drug therapy: Secondary | ICD-10-CM | POA: Diagnosis not present

## 2021-03-14 DIAGNOSIS — D6481 Anemia due to antineoplastic chemotherapy: Secondary | ICD-10-CM | POA: Diagnosis not present

## 2021-03-14 DIAGNOSIS — T451X5A Adverse effect of antineoplastic and immunosuppressive drugs, initial encounter: Secondary | ICD-10-CM | POA: Diagnosis not present

## 2021-03-14 DIAGNOSIS — C3491 Malignant neoplasm of unspecified part of right bronchus or lung: Secondary | ICD-10-CM

## 2021-03-14 LAB — TSH: TSH: 1.889 u[IU]/mL (ref 0.320–4.118)

## 2021-03-14 MED ORDER — CYANOCOBALAMIN 1000 MCG/ML IJ SOLN: 1000.0000 ug | Freq: Once | INTRAMUSCULAR | Status: DC

## 2021-03-14 MED ORDER — SODIUM CHLORIDE 0.9 % IV SOLN
200.0000 mg | Freq: Once | INTRAVENOUS | Status: AC
Start: 2021-03-14 — End: 2021-03-14
  Administered 2021-03-14: 200 mg via INTRAVENOUS
  Filled 2021-03-14: qty 8

## 2021-03-14 MED ORDER — HEPARIN SOD (PORK) LOCK FLUSH 100 UNIT/ML IV SOLN
500.0000 [IU] | Freq: Once | INTRAVENOUS | Status: AC | PRN
  Administered 2021-03-14: 500 [IU]

## 2021-03-14 MED ORDER — ONDANSETRON HCL 4 MG/2ML IJ SOLN
8.0000 mg | Freq: Once | INTRAMUSCULAR | Status: AC
  Administered 2021-03-14: 8 mg via INTRAVENOUS
  Filled 2021-03-14: qty 4

## 2021-03-14 MED ORDER — SODIUM CHLORIDE 0.9 % IV SOLN
500.0000 mg/m2 | Freq: Once | INTRAVENOUS | Status: AC
  Administered 2021-03-14: 900 mg via INTRAVENOUS
  Filled 2021-03-14: qty 20

## 2021-03-14 MED ORDER — SODIUM CHLORIDE 0.9 % IV SOLN: Freq: Once | INTRAVENOUS | Status: AC

## 2021-03-14 MED ORDER — SODIUM CHLORIDE 0.9% FLUSH
10.0000 mL | INTRAVENOUS | Status: DC | PRN
  Administered 2021-03-14: 10 mL

## 2021-03-14 NOTE — Patient Instructions (Signed)
[facility]   Discharge Instructions: Thank you for choosing [facility] to provide your oncology and hematology care.   If you have a lab appointment with the [facility], please go directly to the [facility] and check in at the registration area.   Wear comfortable clothing and clothing appropriate for easy access to any Portacath or PICC line.   We strive to give you quality time with your provider. You may need to reschedule your appointment if you arrive late (15 or more minutes).  Arriving late affects you and other patients whose appointments are after yours.  Also, if you miss three or more appointments without notifying the office, you may be dismissed from the clinic at the provider's discretion.      For prescription refill requests, have your pharmacy contact our office and allow 72 hours for refills to be completed.    Today you received the following chemotherapy and/or immunotherapy agents: pembrolizumab and pemetrexed.      To help prevent nausea and vomiting after your treatment, we encourage you to take your nausea medication as directed.  BELOW ARE SYMPTOMS THAT SHOULD BE REPORTED IMMEDIATELY: *FEVER GREATER THAN 100.4 F (38 C) OR HIGHER *CHILLS OR SWEATING *NAUSEA AND VOMITING THAT IS NOT CONTROLLED WITH YOUR NAUSEA MEDICATION *UNUSUAL SHORTNESS OF BREATH *UNUSUAL BRUISING OR BLEEDING *URINARY PROBLEMS (pain or burning when urinating, or frequent urination) *BOWEL PROBLEMS (unusual diarrhea, constipation, pain near the anus) TENDERNESS IN MOUTH AND THROAT WITH OR WITHOUT PRESENCE OF ULCERS (sore throat, sores in mouth, or a toothache) UNUSUAL RASH, SWELLING OR PAIN  UNUSUAL VAGINAL DISCHARGE OR ITCHING   Items with * indicate a potential emergency and should be followed up as soon as possible or go to the Emergency Department if any problems should occur.  Please show the CHEMOTHERAPY ALERT CARD or IMMUNOTHERAPY  ALERT CARD at check-in to the Emergency Department and triage nurse.  Should you have questions after your visit or need to cancel or reschedule your appointment, please contact [facility]  Dept: [phone]  and follow the prompts.  Office hours are 8:00 a.m. to 4:30 p.m. Monday - Friday. Please note that voicemails left after 4:00 p.m. may not be returned until the following business day.  We are closed weekends and major holidays. You have access to a nurse at all times for urgent questions. Please call the main number to the clinic Dept: [phone] and follow the prompts.   For any non-urgent questions, you may also contact your provider using MyChart. We now offer e-Visits for anyone 27 and older to request care online for non-urgent symptoms. For details visit [email or web address].   Also download the MyChart app! Go to the app store, search "MyChart", open the app, select [facility], and log in with your MyChart username and password.  Due to Covid, a mask is required upon entering the hospital/clinic. If you do not have a mask, one will be given to you upon arrival. For doctor visits, patients may have 1 support person aged 81 or older with them. For treatment visits, patients cannot have anyone with them due to current Covid guidelines and our immunocompromised population.

## 2021-03-17 ENCOUNTER — Other Ambulatory Visit: Payer: Self-pay | Admitting: Internal Medicine

## 2021-03-28 NOTE — Progress Notes (Signed)
[facility] OFFICE PROGRESS NOTE  [name], NP [place] [address]  DIAGNOSIS: Metastatic non-small cell lung cancer initially diagnosed as stage IIIA (T3, N1, M0) non-small cell lung cancer, adenocarcinoma with no actionable mutations presented with multiple pulmonary nodules in the right upper lobe as well as metastatic disease in intraparenchymal lymphadenopathy.  This was diagnosed in February 2020.  The patient has disease recurrence in March 2022. PD-L1 expression 20%. He has no actionable mutations by foundation 1.    PRIOR THERAPY:   1) Status post right upper lobectomy with lymph node dissection. 2) Adjuvant systemic chemotherapy with cisplatin 75 mg/M2 and Alimta 500 mg/M2 every 3 weeks.  Status post 4 cycles. 3) palliative radiotherapy to the metastatic bone disease and the scapula as well as the left supraclavicular area under the care of Dr. Isidore Moos  CURRENT THERAPY: Systemic chemotherapy with carboplatin for AUC of 5, Alimta 500 mg/M2 and Keytruda 200 mg IV every 3 weeks.  First dose November 06, 2020.  Status post 7 cycles.  Starting from cycle #5, the patient will be treated with maintenance Keytruda and Alimta IV every 3 weeks.  INTERVAL HISTORY: Sean Rose 65 y.o. male returns to clinic today for a follow-up visit.  The patients is feeling fairly well today without any concerning complaints. The patient tolerated his last cycle of treatment well without any concerning adverse side effects.  He is now on maintenance treatment with immunotherapy with Keytruda and chemotherapy with Alimta.  He denies any fever, chills, or night sweats. He lost a few pounds which he attributes to having a funny taste in his mouth after treatment. He has occasional nausea but related to eating something that upsets his stomach. He denies any vomiting, diarrhea, or constipation.  He reports a baseline nagging dry cough, which is worse at night. However, he  states it is not bad enough to warrant taking a cough medication. He reports allergies and that the only thing that works for him is benadryl. He states that benadryl does not make him drowsy.  He reports his usual shortness of breath. He denies any chest pain or hemoptysis.  He denies any rashes or skin changes.  He denies any headache or visual changes.  The patient is here today for evaluation and repeat blood work before starting cycle #8.   MEDICAL HISTORY: Past Medical History:  Diagnosis Date   Anginal pain ([facility])    CAD (coronary artery disease)    CAP (community acquired pneumonia) 09/2016   COPD (chronic obstructive pulmonary disease) (HCC)    GERD (gastroesophageal reflux disease)    Heart murmur    "I was told I had one when I was a kid"   Hemorrhoids    History of anal fissures    "no surgeries" (10/30/2016)   Hyperlipidemia    Hypertension    lung ca dx'd 08/2018   Peripheral arterial disease ([facility])    status post right common iliac artery stenting back in 2007   Seasonal allergies    Tobacco abuse     ALLERGIES:  is allergic to compazine [prochlorperazine].  MEDICATIONS:  Current Outpatient Medications  Medication Sig Dispense Refill   aspirin EC 81 MG tablet Take 81 mg by mouth daily.     cetirizine (ZYRTEC) 10 MG tablet Take 10 mg by mouth at bedtime as needed for allergies.     clopidogrel (PLAVIX) 75 MG tablet TAKE 1 TABLET BY MOUTH EVERY DAY 90 tablet 3  esomeprazole (NEXIUM) 20 MG capsule Take 40 mg by mouth daily.     folic acid (FOLVITE) 1 MG tablet TAKE 1 TABLET(1 MG) BY MOUTH DAILY 30 tablet 4   lidocaine-prilocaine (EMLA) cream Apply to the Port-A-Cath site 30-60 minutes before treatment. 30 g 0   Magnesium 500 MG TABS Take 1 tablet (500 mg total) by mouth 3 (three) times daily. 8 tablet 0   Multiple Vitamin (MULTIVITAMIN WITH MINERALS) TABS tablet Take 1 tablet by mouth daily.     nitroGLYCERIN (NITROSTAT) 0.4 MG SL tablet PLACE 1 TABLET UNDER THE TONGUE  EVERY 5 MINUTES AS NEEDED FOR CHEST PAIN. 25 tablet 6   ondansetron (ZOFRAN) 8 MG tablet Take 1 tablet (8 mg total) by mouth every 8 (eight) hours as needed for nausea or vomiting. 20 tablet 0   rosuvastatin (CRESTOR) 5 MG tablet Take 1 tablet (5 mg total) by mouth every other day. 45 tablet 3   umeclidinium-vilanterol (ANORO ELLIPTA) 62.5-25 MCG/INH AEPB Inhale 1 puff into the lungs daily. 60 each 5   No current facility-administered medications for this visit.    SURGICAL HISTORY:  Past Surgical History:  Procedure Laterality Date   ANKLE SURGERY Left    "rebuilt it"   ANTERIOR CRUCIATE LIGAMENT REPAIR Right    CARDIAC CATHETERIZATION  09/23/2008   Continued medical therapy - may need GI evaluation in addition.   CARDIAC CATHETERIZATION  10/28/2007   Medical therapy recommended.   CARDIAC CATHETERIZATION  11/18/2006   In-stent restenosis RCA  (50% distal edge, 80% segmental mid, and 50-60% segmental proximal). Successful cutting balloon atherectomy using a [id number] cutting balloon. 3 inflations with atherectomy performed on mid and proximal portions resulting in reduction of 80% mid in-stent restenosis to less than 20% residual and 50-60% segmental proximal to less than 20% residual without dissection.   CARDIAC CATHETERIZATION  02/26/2006   Severe stenosis in RCA. Stenting performed using IVUS. 3.5x20 Maverick balloon deployed at [employer]. Distal stent-a 4x28 Liberte stent-deployed 12atm 48sec, 12atm 31sec, 4atm 19sec. Mid stent-a 4x28 Liberte stent-deployed 14atm 45sec, 14atm 60sec, 14atm 44sec. Proximal stent-4x8 Liberte- 14atm 45sec,14atm 47sec, 16atm 43sec. Severely diseased segment then appeared TIMI-3 flow.   CARDIOVASCULAR STRESS TEST  11/17/2012   No significant ECG changes. Septal perfusion defect is new when complared to study from 2010. Abnormal myocardial perfusion imaging with a basal to mid perfusion suggestive of previous MI.   CAROTID DOPPLER  08/09/2011   Bilateral Bulb/Proximal  ICA - demonstrated a mild amount of fibrous plaque without evidence of significant diameter reduction reduction or other vascular abnormality.   CHEST TUBE INSERTION Right 09/02/2018   Procedure: Chest Tube Insertion;  Surgeon: Garner Nash, DO;  Location: [facility];  Service: Thoracic;  Laterality: Right;   COLONOSCOPY     2003, 2014   CORONARY ANGIOPLASTY WITH STENT PLACEMENT     FEMORAL ARTERY STENT     INGUINAL HERNIA REPAIR Right    IR IMAGING GUIDED PORT INSERTION  11/13/2020   KNEE ARTHROSCOPY Right "multiple"   LAPAROSCOPIC APPENDECTOMY N/A 12/07/2018   Procedure: APPENDECTOMY LAPAROSCOPIC;  Surgeon: Coralie Keens, MD;  Location: [facility];  Service: General;  Laterality: N/A;   LOWER EXTREMITY ARTERIAL DOPPLER  01/31/2011   Bilateral ABIs-normal values with no suggestion of arterial insuff to the lower extremities at rest. Right CIA stent-mild amount of nonhemodynamically significant plaque is noted throughout   [facility]   "bone-eating tumor"   PERCUTANEOUS STENT INTERVENTION  04/04/2006 & 04/13/2015  a. Right common iliac artery with an 8.0x18 mm Herculink stent deployed at 12 atm. Stenosis was reduced from 80% to 0% with brisk flow. b. I-cast stenting to left common iliac artery   PERIPHERAL VASCULAR CATHETERIZATION N/A 04/13/2015   Procedure: Lower Extremity Angiography;  Surgeon: Lorretta Harp, MD; L-oCIA 75%, 40-50% L-EIA, R-CIA stent patent, s/p 8 mm x 38 mm ICast covered stent>>0% stenosis in [place] Right    TRANSTHORACIC ECHOCARDIOGRAM  11/26/2012   EF not noted. Aortic valve-sclerosis without stenosis, no regurgiation.    UPPER GASTROINTESTINAL ENDOSCOPY     US CAROTID DOPPLER BILATERAL ([facility])  08/09/2011   Bilateral Bulb/Proximal ICAa demonstrated a mild amount of fibrous plaque without evidence of significant diameter reduction or any other vascular abnormality.   VIDEO ASSISTED THORACOSCOPY (VATS)/ LOBECTOMY  Right 09/14/2018   Procedure: RIGHT VIDEO ASSISTED THORACOSCOPY (VATS)/ RIGHT UPPER LOBECTOMY;  Surgeon: Melrose Nakayama, MD;  Location: [facility];  Service: Thoracic;  Laterality: Right;   VIDEO BRONCHOSCOPY WITH ENDOBRONCHIAL NAVIGATION N/A 09/02/2018   Procedure: VIDEO BRONCHOSCOPY WITH ENDOBRONCHIAL NAVIGATION;  Surgeon: Garner Nash, DO;  Location: [facility];  Service: Thoracic;  Laterality: N/A;    REVIEW OF SYSTEMS:   Review of Systems  Constitutional: Positive for fatigue, decreased appetite, and weight loss. Negative for chills and fever. HENT: Positive for taste alterations. Negative for mouth sores, nosebleeds, sore throat and trouble swallowing.   Eyes: Negative for eye problems and icterus.  Respiratory: Positive for chronic cough and dyspnea on exertion. Negative for hemoptysis and wheezing Cardiovascular: Negative for chest pain and leg swelling.  Gastrointestinal: Positive for occasional nausea. Negative for abdominal pain, constipation, diarrhea, and vomiting.  Genitourinary: Negative for bladder incontinence, difficulty urinating, dysuria, frequency and hematuria.   Musculoskeletal: Negative for back pain, gait problem, neck pain and neck stiffness.  Skin: Negative for itching and rash.  Neurological: Negative for dizziness, extremity weakness, gait problem, headaches, light-headedness and seizures.  Hematological: Negative for adenopathy. Does not bruise/bleed easily.  Psychiatric/Behavioral: Negative for confusion, depression and sleep disturbance. The patient is not nervous/anxious.     PHYSICAL EXAMINATION:  Blood pressure (!) 135/93, pulse 62, temperature (!) 96.7 F (35.9 C), temperature source Oral, resp. rate 17, weight 130 lb 8 oz (59.2 kg), SpO2 100 %.  ECOG PERFORMANCE STATUS: 1  Physical Exam  Constitutional: Oriented to person, place, and time and well-developed, well-nourished, and in no distress. HENT: Head: Normocephalic and atraumatic. Mouth/Throat:  Oropharynx is clear and moist. No oropharyngeal exudate. Eyes: Conjunctivae are normal. Right eye exhibits no discharge. Left eye exhibits no discharge. No scleral icterus. Neck: Normal range of motion. Neck supple. Cardiovascular: Normal rate, regular rhythm, normal heart sounds and intact distal pulses.   Pulmonary/Chest: Effort normal and breath sounds normal. No respiratory distress. No wheezes. No rales. Abdominal: Soft. Bowel sounds are normal. Exhibits no distension and no mass. There is no tenderness.  Musculoskeletal: Normal range of motion. Exhibits no edema.  Lymphadenopathy:    No cervical adenopathy.  Neurological: Alert and oriented to person, place, and time. Exhibits normal muscle tone. Gait normal. Coordination normal. Skin: Skin is warm and dry. No rash noted. Not diaphoretic. No erythema. No pallor.  Psychiatric: Mood, memory and judgment normal. Vitals reviewed.    LABORATORY DATA: Lab Results  Component Value Date   WBC 6.8 04/03/2021   HGB 10.7 (L) 04/03/2021   HCT 29.8 (L) 04/03/2021   MCV 99.3  04/03/2021   PLT 399 04/03/2021      Chemistry      Component Value Date/Time   NA 128 (L) 04/03/2021 1118   NA 130 (L) 03/05/2017 1110   K 3.9 04/03/2021 1118   CL 93 (L) 04/03/2021 1118   CO2 23 04/03/2021 1118   BUN 6 (L) 04/03/2021 1118   BUN 4 (L) 03/05/2017 1110   CREATININE 0.97 04/03/2021 1118   CREATININE 0.76 04/28/2020 0826      Component Value Date/Time   CALCIUM 9.2 04/03/2021 1118   ALKPHOS 94 04/03/2021 1118   AST 36 04/03/2021 1118   ALT 19 04/03/2021 1118   BILITOT 0.6 04/03/2021 1118       RADIOGRAPHIC STUDIES:  No results found.   ASSESSMENT/PLAN:  This is a very pleasant 65 year old Caucasian male who was initially diagnosed with stage IIIB non-small cell lung cancer, adenocarcinoma. He is status post a right upper lobectomy with lymph node dissection under the care of [name].  He was negative for any actionable  mutations.  He completed adjuvant chemotherapy with cisplatin and Alimta for 4 cycles.  He tolerated this fairly well.  His PD-L1 expression is 20%.    The patient had been on observation until imaging studies in February 2022 showed suspicious bone lesions in the left shoulder area.  A PET scan was performed on 09/25/2020 which showed metastatic disease involving the left scapula and left supraclavicular lymph nodes. The patient had a ultrasound-guided biopsy of the left supraclavicular lymph node and it was positive for metastatic carcinoma.   The patient then underwent palliative radiotherapy to the metastatic bone lesion in the left scapula under the care of Dr. Isidore Moos.   The patient is currently undergoing systemic chemotherapy with carboplatin for an AUC of 5, Alimta 500 mg per metered squared, Keytruda 20 mg IV every 3 weeks.  The patient is status post 7 cycles.  Starting from cycle #5, the patient started maintenance Alimta and Keytruda.    Labs reviewed. Recommend that he proceed with cycle #8 today's schedule.  We will see him back for follow-up visit in 3 weeks for evaluation before starting his next cycle of treatment.  Discussed if he needed to take cough medication for his cough, he certainly can. Recommended delsym or robitussin. He states his cough is not bad enough to warrant any medication.   No thrush on exam today. Advised to use salt water rinses and biotene for taste alterations.   The patient was advised to call immediately if he has any concerning symptoms in the interval. The patient voices understanding of current disease status and treatment options and is in agreement with the current care plan. All questions were answered. The patient knows to call the clinic with any problems, questions or concerns. We can certainly see the patient much sooner if necessary       No orders of the defined types were placed in this encounter.    The total time spent in the  appointment was 20-29 minutes.   Jalaya Sarver L Sabrina Arriaga, PA-C 04/03/21

## 2021-04-03 ENCOUNTER — Inpatient Hospital Stay: Payer: BC Managed Care – PPO | Attending: Internal Medicine

## 2021-04-03 ENCOUNTER — Inpatient Hospital Stay: Payer: BC Managed Care – PPO

## 2021-04-03 ENCOUNTER — Inpatient Hospital Stay (HOSPITAL_BASED_OUTPATIENT_CLINIC_OR_DEPARTMENT_OTHER): Payer: BC Managed Care – PPO | Admitting: Physician Assistant

## 2021-04-03 ENCOUNTER — Other Ambulatory Visit: Payer: Self-pay

## 2021-04-03 VITALS — BP 135/93 | HR 62 | Temp 96.7°F | Resp 17 | Wt 130.5 lb

## 2021-04-03 DIAGNOSIS — Z79899 Other long term (current) drug therapy: Secondary | ICD-10-CM | POA: Diagnosis not present

## 2021-04-03 DIAGNOSIS — C3491 Malignant neoplasm of unspecified part of right bronchus or lung: Secondary | ICD-10-CM

## 2021-04-03 DIAGNOSIS — Z5112 Encounter for antineoplastic immunotherapy: Secondary | ICD-10-CM | POA: Diagnosis not present

## 2021-04-03 DIAGNOSIS — C3411 Malignant neoplasm of upper lobe, right bronchus or lung: Secondary | ICD-10-CM | POA: Insufficient documentation

## 2021-04-03 DIAGNOSIS — C7951 Secondary malignant neoplasm of bone: Secondary | ICD-10-CM | POA: Insufficient documentation

## 2021-04-03 DIAGNOSIS — C770 Secondary and unspecified malignant neoplasm of lymph nodes of head, face and neck: Secondary | ICD-10-CM | POA: Insufficient documentation

## 2021-04-03 DIAGNOSIS — Z5111 Encounter for antineoplastic chemotherapy: Secondary | ICD-10-CM | POA: Insufficient documentation

## 2021-04-03 DIAGNOSIS — Z95828 Presence of other vascular implants and grafts: Secondary | ICD-10-CM

## 2021-04-03 LAB — CBC WITH DIFFERENTIAL (CANCER CENTER ONLY)
Abs Immature Granulocytes: 0.02 10*3/uL (ref 0.00–0.07)
Basophils Absolute: 0 10*3/uL (ref 0.0–0.1)
Basophils Relative: 0 %
Eosinophils Absolute: 0.2 10*3/uL (ref 0.0–0.5)
Eosinophils Relative: 2 %
HCT: 29.8 % — ABNORMAL LOW (ref 39.0–52.0)
Hemoglobin: 10.7 g/dL — ABNORMAL LOW (ref 13.0–17.0)
Immature Granulocytes: 0 %
Lymphocytes Relative: 29 %
Lymphs Abs: 2 10*3/uL (ref 0.7–4.0)
MCH: 35.7 pg — ABNORMAL HIGH (ref 26.0–34.0)
MCHC: 35.9 g/dL (ref 30.0–36.0)
MCV: 99.3 fL (ref 80.0–100.0)
Monocytes Absolute: 1.1 10*3/uL — ABNORMAL HIGH (ref 0.1–1.0)
Monocytes Relative: 17 %
Neutro Abs: 3.5 10*3/uL (ref 1.7–7.7)
Neutrophils Relative %: 52 %
Platelet Count: 399 10*3/uL (ref 150–400)
RBC: 3 MIL/uL — ABNORMAL LOW (ref 4.22–5.81)
RDW: 19.3 % — ABNORMAL HIGH (ref 11.5–15.5)
WBC Count: 6.8 10*3/uL (ref 4.0–10.5)
nRBC: 0 % (ref 0.0–0.2)

## 2021-04-03 LAB — CMP (CANCER CENTER ONLY)
ALT: 19 U/L (ref 0–44)
AST: 36 U/L (ref 15–41)
Albumin: 3.4 g/dL — ABNORMAL LOW (ref 3.5–5.0)
Alkaline Phosphatase: 94 U/L (ref 38–126)
Anion gap: 12 (ref 5–15)
BUN: 6 mg/dL — ABNORMAL LOW (ref 8–23)
CO2: 23 mmol/L (ref 22–32)
Calcium: 9.2 mg/dL (ref 8.9–10.3)
Chloride: 93 mmol/L — ABNORMAL LOW (ref 98–111)
Creatinine: 0.97 mg/dL (ref 0.61–1.24)
GFR, Estimated: >60 mL/min (ref >60)
Glucose, Bld: 100 mg/dL — ABNORMAL HIGH (ref 70–99)
Potassium: 3.9 mmol/L (ref 3.5–5.1)
Sodium: 128 mmol/L — ABNORMAL LOW (ref 135–145)
Total Bilirubin: 0.6 mg/dL (ref 0.3–1.2)
Total Protein: 7.1 g/dL (ref 6.5–8.1)

## 2021-04-03 LAB — TSH: TSH: 0.455 u[IU]/mL (ref 0.320–4.118)

## 2021-04-03 MED ORDER — SODIUM CHLORIDE 0.9% FLUSH
10.0000 mL | INTRAVENOUS | Status: DC | PRN
  Administered 2021-04-03: 10 mL

## 2021-04-03 MED ORDER — SODIUM CHLORIDE 0.9 % IV SOLN: Freq: Once | INTRAVENOUS | Status: AC

## 2021-04-03 MED ORDER — ONDANSETRON HCL 4 MG/2ML IJ SOLN
8.0000 mg | Freq: Once | INTRAMUSCULAR | Status: AC
  Administered 2021-04-03: 8 mg via INTRAVENOUS
  Filled 2021-04-03: qty 4

## 2021-04-03 MED ORDER — SODIUM CHLORIDE 0.9 % IV SOLN
500.0000 mg/m2 | Freq: Once | INTRAVENOUS | Status: AC
  Administered 2021-04-03: 900 mg via INTRAVENOUS
  Filled 2021-04-03: qty 20

## 2021-04-03 MED ORDER — SODIUM CHLORIDE 0.9 % IV SOLN
200.0000 mg | Freq: Once | INTRAVENOUS | Status: AC
  Administered 2021-04-03: 200 mg via INTRAVENOUS
  Filled 2021-04-03: qty 8

## 2021-04-03 MED ORDER — SODIUM CHLORIDE 0.9% FLUSH
10.0000 mL | Freq: Once | INTRAVENOUS | Status: AC
  Administered 2021-04-03: 10 mL

## 2021-04-03 MED ORDER — HEPARIN SOD (PORK) LOCK FLUSH 100 UNIT/ML IV SOLN
500.0000 [IU] | Freq: Once | INTRAVENOUS | Status: AC | PRN
Start: 2021-04-03 — End: 2021-04-03
  Administered 2021-04-03: 500 [IU]

## 2021-04-03 NOTE — Patient Instructions (Signed)
[facility]   Discharge Instructions: Thank you for choosing [facility] to provide your oncology and hematology care.   If you have a lab appointment with the [facility], please go directly to the [facility] and check in at the registration area.   Wear comfortable clothing and clothing appropriate for easy access to any Portacath or PICC line.   We strive to give you quality time with your provider. You may need to reschedule your appointment if you arrive late (15 or more minutes).  Arriving late affects you and other patients whose appointments are after yours.  Also, if you miss three or more appointments without notifying the office, you may be dismissed from the clinic at the provider's discretion.      For prescription refill requests, have your pharmacy contact our office and allow 72 hours for refills to be completed.    Today you received the following chemotherapy and/or immunotherapy agents: pembrolizumab and pemetrexed.      To help prevent nausea and vomiting after your treatment, we encourage you to take your nausea medication as directed.  BELOW ARE SYMPTOMS THAT SHOULD BE REPORTED IMMEDIATELY: *FEVER GREATER THAN 100.4 F (38 C) OR HIGHER *CHILLS OR SWEATING *NAUSEA AND VOMITING THAT IS NOT CONTROLLED WITH YOUR NAUSEA MEDICATION *UNUSUAL SHORTNESS OF BREATH *UNUSUAL BRUISING OR BLEEDING *URINARY PROBLEMS (pain or burning when urinating, or frequent urination) *BOWEL PROBLEMS (unusual diarrhea, constipation, pain near the anus) TENDERNESS IN MOUTH AND THROAT WITH OR WITHOUT PRESENCE OF ULCERS (sore throat, sores in mouth, or a toothache) UNUSUAL RASH, SWELLING OR PAIN  UNUSUAL VAGINAL DISCHARGE OR ITCHING   Items with * indicate a potential emergency and should be followed up as soon as possible or go to the Emergency Department if any problems should occur.  Please show the CHEMOTHERAPY ALERT CARD or IMMUNOTHERAPY  ALERT CARD at check-in to the Emergency Department and triage nurse.  Should you have questions after your visit or need to cancel or reschedule your appointment, please contact [facility]  Dept: [phone]  and follow the prompts.  Office hours are 8:00 a.m. to 4:30 p.m. Monday - Friday. Please note that voicemails left after 4:00 p.m. may not be returned until the following business day.  We are closed weekends and major holidays. You have access to a nurse at all times for urgent questions. Please call the main number to the clinic Dept: [phone] and follow the prompts.   For any non-urgent questions, you may also contact your provider using MyChart. We now offer e-Visits for anyone 61 and older to request care online for non-urgent symptoms. For details visit [email or web address].   Also download the MyChart app! Go to the app store, search "MyChart", open the app, select [facility], and log in with your MyChart username and password.  Due to Covid, a mask is required upon entering the hospital/clinic. If you do not have a mask, one will be given to you upon arrival. For doctor visits, patients may have 1 support person aged 85 or older with them. For treatment visits, patients cannot have anyone with them due to current Covid guidelines and our immunocompromised population.

## 2021-04-05 NOTE — Progress Notes (Signed)
Cardiology Office Note:    Date:  04/06/2021   ID:  Sean Rose, DOB 03/24/1956, MRN [id number]  PCP:  Sean Peng, NP Referring MD: Sean Peng, NP    Sean Rose  Cardiologist:  Sean Burow, MD   Reason for visit: 69-monthfollow-up  History of Present Illness:    Sean Rose a 65y.o. male with a Rose of CAD status post bare-metal stent to the RCA, history of in-stent restenosis in redilation, PVD status post stenting to the right common iliac artery in 2007 & stenting of the left common iliac artery in 2016, hypertension, hyperlipidemia, and history of tobacco use (quit 08/2018).  Noted onc Rose: Metastatic non-small cell lung cancer initially diagnosed as stage IIIA (T3, N1, M0) non-small cell lung cancer, adenocarcinoma with no actionable mutations presented with multiple pulmonary nodules in the right upper lobe as well as metastatic disease in intraparenchymal lymphadenopathy.  This was diagnosed in February 2020.  The patient has disease recurrence in March 2022.  Today he comes in the office and mentions he is doing well from a cardiovascular standpoint.  He denies angina.  He does say he gets some shortness of breath with associated chest tightness when laying down status post lobectomy.  He states from a lung cancer standpoint, he is stage IV but tolerating treatment well and responding well.  He mentions the life expectancy at this stage is less than 5 years.  He states he is the healthiest sick guy around.  He does complain of chronic fatigue.  He mentions that when he went to the hospital last month, they recommended his blood pressure medicines be reduced secondary to lower blood pressure.  Since then he is cut his amlodipine 10 mg in metoprolol XL 100 mg in half.  His BP today is 115/62.  He denies lightheadedness, syncope and bleeding issues.  From a PVD standpoint, he denies claudication.  No leg swelling.  No bleeding issues on aspirin and  Plavix.  He states he cannot remember the reason for his every other day dosing with Crestor.  He does not think she was fasting prior to his last lipid check.   Past Medical History:  Diagnosis Date   Anginal pain ([facility]    CAD (coronary artery disease)    CAP (community acquired pneumonia) 09/2016   COPD (chronic obstructive pulmonary disease) (HCC)    GERD (gastroesophageal reflux disease)    Heart murmur    "I was told I had one when I was a kid"   Hemorrhoids    History of anal fissures    "no surgeries" (10/30/2016)   Hyperlipidemia    Hypertension    lung ca dx'd 08/2018   Peripheral arterial disease ([facility]    status post right common iliac artery stenting back in 2007   Seasonal allergies    Tobacco abuse     Past Surgical History:  Procedure Laterality Date   ANKLE SURGERY Left    "rebuilt it"   ANTERIOR CRUCIATE LIGAMENT REPAIR Right    CARDIAC CATHETERIZATION  09/23/2008   Continued medical therapy - may need GI evaluation in addition.   CARDIAC CATHETERIZATION  10/28/2007   Medical therapy recommended.   CARDIAC CATHETERIZATION  11/18/2006   In-stent restenosis RCA  (50% distal edge, 80% segmental mid, and 50-60% segmental proximal). Successful cutting balloon atherectomy using a [id number] cutting balloon. 3 inflations with atherectomy performed on mid and proximal portions resulting in reduction of 80% mid  in-stent restenosis to less than 20% residual and 50-60% segmental proximal to less than 20% residual without dissection.   CARDIAC CATHETERIZATION  02/26/2006   Severe stenosis in RCA. Stenting performed using IVUS. 3.5x20 Maverick balloon deployed at [employer]. Distal stent-a 4x28 Liberte stent-deployed 12atm 48sec, 12atm 31sec, 4atm 19sec. Mid stent-a 4x28 Liberte stent-deployed 14atm 45sec, 14atm 60sec, 14atm 44sec. Proximal stent-4x8 Liberte- 14atm 45sec,14atm 47sec, 16atm 43sec. Severely diseased segment then appeared TIMI-3 flow.   CARDIOVASCULAR STRESS TEST  11/17/2012    No significant ECG changes. Septal perfusion defect is new when complared to study from 2010. Abnormal myocardial perfusion imaging with a basal to mid perfusion suggestive of previous MI.   CAROTID DOPPLER  08/09/2011   Bilateral Bulb/Proximal ICA - demonstrated a mild amount of fibrous plaque without evidence of significant diameter reduction reduction or other vascular abnormality.   CHEST TUBE INSERTION Right 09/02/2018   Procedure: Chest Tube Insertion;  Surgeon: Sean Rose;  Location: [facility];  Service: Thoracic;  Laterality: Right;   COLONOSCOPY     2003, 2014   CORONARY ANGIOPLASTY WITH STENT PLACEMENT     FEMORAL ARTERY STENT     INGUINAL HERNIA REPAIR Right    IR IMAGING GUIDED PORT INSERTION  11/13/2020   KNEE ARTHROSCOPY Right "multiple"   LAPAROSCOPIC APPENDECTOMY N/A 12/07/2018   Procedure: APPENDECTOMY LAPAROSCOPIC;  Surgeon: Sean Keens, MD;  Location: [facility];  Service: General;  Laterality: N/A;   LOWER EXTREMITY ARTERIAL DOPPLER  01/31/2011   Bilateral ABIs-normal values with no suggestion of arterial insuff to the lower extremities at rest. Right CIA stent-mild amount of nonhemodynamically significant plaque is noted throughout   [place]   "bone-eating tumor"   PERCUTANEOUS STENT INTERVENTION  04/04/2006 & 04/13/2015   a. Right common iliac artery with an 8.0x18 mm Herculink stent deployed at 12 atm. Stenosis was reduced from 80% to 0% with brisk flow. b. I-cast stenting to left common iliac artery   PERIPHERAL VASCULAR CATHETERIZATION N/A 04/13/2015   Procedure: Lower Extremity Angiography;  Surgeon: Sean Harp, MD; L-oCIA 75%, 40-50% L-EIA, R-CIA stent patent, s/p 8 mm x 38 mm ICast covered stent>>0% stenosis in [place] Right    TRANSTHORACIC ECHOCARDIOGRAM  11/26/2012   EF not noted. Aortic valve-sclerosis without stenosis, no regurgiation.    UPPER GASTROINTESTINAL ENDOSCOPY     US CAROTID  DOPPLER BILATERAL (Sean Rose)  08/09/2011   Bilateral Bulb/Proximal ICAa demonstrated a mild amount of fibrous plaque without evidence of significant diameter reduction or any other vascular abnormality.   VIDEO ASSISTED THORACOSCOPY (VATS)/ LOBECTOMY Right 09/14/2018   Procedure: RIGHT VIDEO ASSISTED THORACOSCOPY (VATS)/ RIGHT UPPER LOBECTOMY;  Surgeon: Melrose Nakayama, MD;  Location: [facility];  Service: Thoracic;  Laterality: Right;   VIDEO BRONCHOSCOPY WITH ENDOBRONCHIAL NAVIGATION N/A 09/02/2018   Procedure: VIDEO BRONCHOSCOPY WITH ENDOBRONCHIAL NAVIGATION;  Surgeon: Sean Rose;  Location: [facility];  Service: Thoracic;  Laterality: N/A;    Current Medications: Current Meds  Medication Sig   amLODipine (NORVASC) 5 MG tablet Take 1 tablet (5 mg total) by mouth daily.   aspirin EC 81 MG tablet Take 81 mg by mouth daily.   cetirizine (ZYRTEC) 10 MG tablet Take 10 mg by mouth at bedtime as needed for allergies.   clopidogrel (PLAVIX) 75 MG tablet TAKE 1 TABLET BY MOUTH EVERY DAY   esomeprazole (NEXIUM) 20 MG capsule Take 40 mg by  mouth daily.   folic acid (FOLVITE) 1 MG tablet TAKE 1 TABLET(1 MG) BY MOUTH DAILY   lidocaine-prilocaine (EMLA) cream Apply to the Port-A-Cath site 30-60 minutes before treatment.   Magnesium 500 MG TABS Take 1 tablet (500 mg total) by mouth 3 (three) times daily.   metoprolol succinate (TOPROL XL) 25 MG 24 hr tablet Take 1 tablet (25 mg total) by mouth daily.   Multiple Vitamin (MULTIVITAMIN WITH MINERALS) TABS tablet Take 1 tablet by mouth daily.   nitroGLYCERIN (NITROSTAT) 0.4 MG SL tablet PLACE 1 TABLET UNDER THE TONGUE EVERY 5 MINUTES AS NEEDED FOR CHEST PAIN.   ondansetron (ZOFRAN) 8 MG tablet Take 1 tablet (8 mg total) by mouth every 8 (eight) hours as needed for nausea or vomiting.   umeclidinium-vilanterol (ANORO ELLIPTA) 62.5-25 MCG/INH AEPB Inhale 1 puff into the lungs daily.     Allergies:   Compazine [prochlorperazine]   Social History    Socioeconomic History   Marital status: Married    Spouse name: Not on file   Number of children: Not on file   Years of education: Not on file   Highest education level: Not on file  Occupational History   Not on file  Tobacco Use   Smoking status: Former    Packs/day: 0.25    Years: 38.00    Pack years: 9.50    Types: Cigarettes    Quit date: 10/19/2018    Years since quitting: 2.4   Smokeless tobacco: Never   Tobacco comments:    pack per day 12.17.19  Vaping Use   Vaping Use: Former  Substance and Sexual Activity   Alcohol use: Yes    Comment: socially   Drug use: No   Sexual activity: Yes  Other Topics Concern   Not on file  Social History Narrative   Not on file   Social Determinants of Health   Financial Resource Strain: Not on file  Food Insecurity: Not on file  Transportation Needs: Not on file  Physical Activity: Not on file  Stress: Not on file  Social Connections: Not on file     Family History: The patient's family history includes Colon cancer in his mother; Heart disease in his father and paternal grandfather.  ROS:   Please see the history of present illness.     EKGs/Labs/Other Studies Reviewed:    EKG:  The ekg ordered today demonstrates normal sinus rhythm, heart rate 64, QRS duration 70 ms.  Recent Labs: 03/01/2021: Magnesium 1.2 04/03/2021: ALT 19; BUN 6; Creatinine 0.97; Hemoglobin 10.7; Platelet Count 399; Potassium 3.9; Sodium 128; TSH 0.455  Recent Lipid Panel    Component Value Date/Time   CHOL 193 08/25/2020 1201   TRIG 171 (H) 08/25/2020 1201   HDL 59 08/25/2020 1201   CHOLHDL 3.3 08/25/2020 1201   CHOLHDL 4.4 12/18/2012 0814   VLDL 45 (H) 12/18/2012 0814   LDLCALC 105 (H) 08/25/2020 1201    Physical Exam:    VS:  BP 115/62   Pulse 64   Ht 5' 8.5" (1.74 m)   Wt 130 lb 3.2 oz (59.1 kg)   SpO2 96%   BMI 19.51 kg/m     Wt Readings from Last 3 Encounters:  04/06/21 130 lb 3.2 oz (59.1 kg)  04/03/21 130 lb 8 oz (59.2  kg)  03/13/21 134 lb 12.8 oz (61.1 kg)     GEN:  Well nourished, well developed in no acute distress HEENT: Normal NECK: No JVD; No carotid bruits  CARDIAC: RRR, no murmurs, rubs, gallops RESPIRATORY:  Clear to auscultation without rales, wheezing or rhonchi  ABDOMEN: Soft, non-tender, non-distended MUSCULOSKELETAL: No edema; No deformity  SKIN: Warm and dry NEUROLOGIC:  Alert and oriented PSYCHIATRIC:  Normal affect   ASSESSMENT AND PLAN   CAD, no angina -Post stent to the RCA in 2007, later redilated for in-stent stenosis -Negative stress test in 2020 (ordered for chest pain) -Continue DAPT with history of recurrent CAD and PVD. -Continue Crestor and Toprol.  With his fatigue is reasonable to decrease metoprolol XL to 25 mg daily.  PVD, no claudication -Status post stenting to the right common iliac artery and 2007 and stenting to the left common iliac artery in 2016 -Normal ABIs and widely patent stents on Doppler study January 2022 -Continue antiplatelet therapy and statin.  Hypertension well-controlled -Okay to continue amlodipine 5 mg daily as reduced by one of his other physicians.  We will send new prescription. -Decrease metoprolol as above. - Goal BP is <130/80.  Recommend DASH diet (high in vegetables, fruits, low-fat dairy products, whole grains, poultry, fish, and nuts and low in sweets, sugar-sweetened beverages, and red meats), salt restriction and increase physical activity.  Hyperlipidemia -LDL 105 in January 2022 (but may have not been fasting).  He is taking Crestor 5 mg every other day. -Fasting lipids in 6 months. -Discussed cholesterol lowering diets - Mediterranean diet, DASH diet, vegetarian diet, low-carbohydrate diet and avoidance of trans fats.  Discussed healthier choice substitutes.  Nuts, high-fiber foods, and fiber supplements may also improve lipids.    Rose of tobacco use  - Quit in 2020 - USPSTF recommends one-time screening for abdominal aortic  aneurysm (AAA) by ultrasound in men 87 -59 years old who have ever smoked.  Will defer on AAA screening secondary to his advanced lung cancer.   Disposition - Follow-up in 6 months with Dr. Alvester Chou, Almyra Deforest or myself.     Medication Adjustments/Labs and Tests Ordered: Current medicines are reviewed at length with the patient today.  Concerns regarding medicines are outlined above.  Orders Placed This Encounter  Procedures   Lipid panel   EKG 12-Lead   Meds ordered this encounter  Medications   amLODipine (NORVASC) 5 MG tablet    Sig: Take 1 tablet (5 mg total) by mouth daily.    Dispense:  180 tablet    Refill:  3   metoprolol succinate (TOPROL XL) 25 MG 24 hr tablet    Sig: Take 1 tablet (25 mg total) by mouth daily.    Dispense:  90 tablet    Refill:  3    Patient Instructions  Medication Instructions:  Decrease Metoprolol succinate to 25 mg (1 Tablet Daily.). Decrease Amlodipine to 5 mg (1 Tablet Daily).  *If you need a refill on your cardiac medications before your next appointment, please call your pharmacy*   Lab Work: Lipid Panel : Prior to Follow Up Visit If you have labs (blood work) drawn today and your tests are completely normal, you will receive your results only by: [place] (if you have MyChart) OR A paper copy in the mail If you have any lab test that is abnormal or we need to change your treatment, we will call you to review the results.   Testing/Procedures: No Testing   Follow-Up: At [facility], you and your health needs are our priority.  As part of our continuing mission to provide you with exceptional heart care, we have created designated Provider Care  Teams.  These Care Teams include your primary Cardiologist (physician) and Advanced Practice Providers (APPs -  Physician Assistants and Nurse Practitioners) who all work together to provide you with the care you need, when you need it.  Your next appointment:   6 month(s)  The  format for your next appointment:   In Person  Provider:   You may see Sean Burow, MD or one of the following Advanced Practice Providers on your designated Care Team:   Sande Rives, PA-C Coletta Memos, FNP     Signed, Gaston Islam  04/06/2021 12:05 PM    [place]

## 2021-04-06 ENCOUNTER — Other Ambulatory Visit: Payer: Self-pay

## 2021-04-06 ENCOUNTER — Encounter: Payer: Self-pay | Admitting: Physician Assistant

## 2021-04-06 ENCOUNTER — Ambulatory Visit (INDEPENDENT_AMBULATORY_CARE_PROVIDER_SITE_OTHER): Payer: BC Managed Care – PPO | Admitting: Physician Assistant

## 2021-04-06 VITALS — BP 115/62 | HR 64 | Ht 68.5 in | Wt 130.2 lb

## 2021-04-06 DIAGNOSIS — I10 Essential (primary) hypertension: Secondary | ICD-10-CM

## 2021-04-06 DIAGNOSIS — E785 Hyperlipidemia, unspecified: Secondary | ICD-10-CM

## 2021-04-06 DIAGNOSIS — I739 Peripheral vascular disease, unspecified: Secondary | ICD-10-CM | POA: Diagnosis not present

## 2021-04-06 DIAGNOSIS — I2510 Atherosclerotic heart disease of native coronary artery without angina pectoris: Secondary | ICD-10-CM

## 2021-04-06 MED ORDER — AMLODIPINE BESYLATE 5 MG PO TABS: 5.0000 mg | ORAL_TABLET | Freq: Every day | ORAL | 3 refills | Status: DC

## 2021-04-06 MED ORDER — METOPROLOL SUCCINATE ER 25 MG PO TB24: 25.0000 mg | ORAL_TABLET | Freq: Every day | ORAL | 3 refills | Status: DC

## 2021-04-06 NOTE — Patient Instructions (Signed)
Medication Instructions:  Decrease Metoprolol succinate to 25 mg (1 Tablet Daily.). Decrease Amlodipine to 5 mg (1 Tablet Daily).  *If you need a refill on your cardiac medications before your next appointment, please call your pharmacy*   Lab Work: Lipid Panel : Prior to Follow Up Visit If you have labs (blood work) drawn today and your tests are completely normal, you will receive your results only by: [place] (if you have MyChart) OR A paper copy in the mail If you have any lab test that is abnormal or we need to change your treatment, we will call you to review the results.   Testing/Procedures: No Testing   Follow-Up: At [facility], you and your health needs are our priority.  As part of our continuing mission to provide you with exceptional heart care, we have created designated Provider Care Teams.  These Care Teams include your primary Cardiologist (physician) and Advanced Practice Providers (APPs -  Physician Assistants and Nurse Practitioners) who all work together to provide you with the care you need, when you need it.  Your next appointment:   6 month(s)  The format for your next appointment:   In Person  Provider:   You may see Quay Burow, MD or one of the following Advanced Practice Providers on your designated Care Team:   [place], PA-C Coletta Memos, FNP

## 2021-04-10 ENCOUNTER — Other Ambulatory Visit: Payer: Self-pay | Admitting: Physician Assistant

## 2021-04-10 DIAGNOSIS — C3491 Malignant neoplasm of unspecified part of right bronchus or lung: Secondary | ICD-10-CM

## 2021-04-11 ENCOUNTER — Inpatient Hospital Stay: Payer: BC Managed Care – PPO

## 2021-04-11 ENCOUNTER — Other Ambulatory Visit: Payer: Self-pay

## 2021-04-11 DIAGNOSIS — Z5112 Encounter for antineoplastic immunotherapy: Secondary | ICD-10-CM | POA: Diagnosis not present

## 2021-04-11 DIAGNOSIS — C3411 Malignant neoplasm of upper lobe, right bronchus or lung: Secondary | ICD-10-CM | POA: Diagnosis not present

## 2021-04-11 DIAGNOSIS — Z79899 Other long term (current) drug therapy: Secondary | ICD-10-CM | POA: Diagnosis not present

## 2021-04-11 DIAGNOSIS — Z95828 Presence of other vascular implants and grafts: Secondary | ICD-10-CM

## 2021-04-11 DIAGNOSIS — C3491 Malignant neoplasm of unspecified part of right bronchus or lung: Secondary | ICD-10-CM

## 2021-04-11 DIAGNOSIS — Z5111 Encounter for antineoplastic chemotherapy: Secondary | ICD-10-CM | POA: Diagnosis not present

## 2021-04-11 DIAGNOSIS — C7951 Secondary malignant neoplasm of bone: Secondary | ICD-10-CM | POA: Diagnosis not present

## 2021-04-11 DIAGNOSIS — C770 Secondary and unspecified malignant neoplasm of lymph nodes of head, face and neck: Secondary | ICD-10-CM | POA: Diagnosis not present

## 2021-04-11 LAB — CBC WITH DIFFERENTIAL (CANCER CENTER ONLY)
Abs Immature Granulocytes: 0.02 10*3/uL (ref 0.00–0.07)
Basophils Absolute: 0 10*3/uL (ref 0.0–0.1)
Basophils Relative: 1 %
Eosinophils Absolute: 0.1 10*3/uL (ref 0.0–0.5)
Eosinophils Relative: 3 %
HCT: 28 % — ABNORMAL LOW (ref 39.0–52.0)
Hemoglobin: 10 g/dL — ABNORMAL LOW (ref 13.0–17.0)
Immature Granulocytes: 1 %
Lymphocytes Relative: 46 %
Lymphs Abs: 1.2 10*3/uL (ref 0.7–4.0)
MCH: 36 pg — ABNORMAL HIGH (ref 26.0–34.0)
MCHC: 35.7 g/dL (ref 30.0–36.0)
MCV: 100.7 fL — ABNORMAL HIGH (ref 80.0–100.0)
Monocytes Absolute: 0.1 10*3/uL (ref 0.1–1.0)
Monocytes Relative: 5 %
Neutro Abs: 1.2 10*3/uL — ABNORMAL LOW (ref 1.7–7.7)
Neutrophils Relative %: 44 %
Platelet Count: 215 10*3/uL (ref 150–400)
RBC: 2.78 MIL/uL — ABNORMAL LOW (ref 4.22–5.81)
RDW: 17.7 % — ABNORMAL HIGH (ref 11.5–15.5)
WBC Count: 2.7 10*3/uL — ABNORMAL LOW (ref 4.0–10.5)
nRBC: 0 % (ref 0.0–0.2)

## 2021-04-11 LAB — SAMPLE TO BLOOD BANK

## 2021-04-11 MED ORDER — HEPARIN SOD (PORK) LOCK FLUSH 100 UNIT/ML IV SOLN
500.0000 [IU] | Freq: Once | INTRAVENOUS | Status: AC
  Administered 2021-04-11: 500 [IU]

## 2021-04-11 MED ORDER — SODIUM CHLORIDE 0.9% FLUSH
10.0000 mL | Freq: Once | INTRAVENOUS | Status: AC
  Administered 2021-04-11: 10 mL

## 2021-04-25 ENCOUNTER — Inpatient Hospital Stay (HOSPITAL_BASED_OUTPATIENT_CLINIC_OR_DEPARTMENT_OTHER): Payer: BC Managed Care – PPO | Admitting: Internal Medicine

## 2021-04-25 ENCOUNTER — Inpatient Hospital Stay: Payer: BC Managed Care – PPO

## 2021-04-25 ENCOUNTER — Other Ambulatory Visit: Payer: Self-pay

## 2021-04-25 ENCOUNTER — Encounter: Payer: Self-pay | Admitting: Internal Medicine

## 2021-04-25 VITALS — BP 139/93 | HR 73 | Temp 98.0°F | Resp 18 | Wt 128.2 lb

## 2021-04-25 DIAGNOSIS — C3411 Malignant neoplasm of upper lobe, right bronchus or lung: Secondary | ICD-10-CM | POA: Diagnosis not present

## 2021-04-25 DIAGNOSIS — Z5111 Encounter for antineoplastic chemotherapy: Secondary | ICD-10-CM | POA: Diagnosis not present

## 2021-04-25 DIAGNOSIS — Z5112 Encounter for antineoplastic immunotherapy: Secondary | ICD-10-CM | POA: Diagnosis not present

## 2021-04-25 DIAGNOSIS — C770 Secondary and unspecified malignant neoplasm of lymph nodes of head, face and neck: Secondary | ICD-10-CM | POA: Diagnosis not present

## 2021-04-25 DIAGNOSIS — Z95828 Presence of other vascular implants and grafts: Secondary | ICD-10-CM

## 2021-04-25 DIAGNOSIS — C3491 Malignant neoplasm of unspecified part of right bronchus or lung: Secondary | ICD-10-CM

## 2021-04-25 DIAGNOSIS — C7951 Secondary malignant neoplasm of bone: Secondary | ICD-10-CM | POA: Diagnosis not present

## 2021-04-25 DIAGNOSIS — Z79899 Other long term (current) drug therapy: Secondary | ICD-10-CM | POA: Diagnosis not present

## 2021-04-25 DIAGNOSIS — C3490 Malignant neoplasm of unspecified part of unspecified bronchus or lung: Secondary | ICD-10-CM

## 2021-04-25 LAB — CMP (CANCER CENTER ONLY)
ALT: 24 U/L (ref 0–44)
AST: 38 U/L (ref 15–41)
Albumin: 3.3 g/dL — ABNORMAL LOW (ref 3.5–5.0)
Alkaline Phosphatase: 96 U/L (ref 38–126)
Anion gap: 11 (ref 5–15)
BUN: 7 mg/dL — ABNORMAL LOW (ref 8–23)
CO2: 25 mmol/L (ref 22–32)
Calcium: 9.3 mg/dL (ref 8.9–10.3)
Chloride: 94 mmol/L — ABNORMAL LOW (ref 98–111)
Creatinine: 1.05 mg/dL (ref 0.61–1.24)
GFR, Estimated: >60 mL/min (ref >60)
Glucose, Bld: 97 mg/dL (ref 70–99)
Potassium: 3.9 mmol/L (ref 3.5–5.1)
Sodium: 130 mmol/L — ABNORMAL LOW (ref 135–145)
Total Bilirubin: 0.5 mg/dL (ref 0.3–1.2)
Total Protein: 7.2 g/dL (ref 6.5–8.1)

## 2021-04-25 LAB — CBC WITH DIFFERENTIAL (CANCER CENTER ONLY)
Abs Immature Granulocytes: 0.01 10*3/uL (ref 0.00–0.07)
Basophils Absolute: 0 10*3/uL (ref 0.0–0.1)
Basophils Relative: 0 %
Eosinophils Absolute: 0.2 10*3/uL (ref 0.0–0.5)
Eosinophils Relative: 3 %
HCT: 28.4 % — ABNORMAL LOW (ref 39.0–52.0)
Hemoglobin: 9.9 g/dL — ABNORMAL LOW (ref 13.0–17.0)
Immature Granulocytes: 0 %
Lymphocytes Relative: 41 %
Lymphs Abs: 2.7 10*3/uL (ref 0.7–4.0)
MCH: 36 pg — ABNORMAL HIGH (ref 26.0–34.0)
MCHC: 34.9 g/dL (ref 30.0–36.0)
MCV: 103.3 fL — ABNORMAL HIGH (ref 80.0–100.0)
Monocytes Absolute: 1.2 10*3/uL — ABNORMAL HIGH (ref 0.1–1.0)
Monocytes Relative: 17 %
Neutro Abs: 2.6 10*3/uL (ref 1.7–7.7)
Neutrophils Relative %: 39 %
Platelet Count: 490 10*3/uL — ABNORMAL HIGH (ref 150–400)
RBC: 2.75 MIL/uL — ABNORMAL LOW (ref 4.22–5.81)
RDW: 17.9 % — ABNORMAL HIGH (ref 11.5–15.5)
WBC Count: 6.8 10*3/uL (ref 4.0–10.5)
nRBC: 0 % (ref 0.0–0.2)

## 2021-04-25 LAB — TSH: TSH: 1.343 u[IU]/mL (ref 0.320–4.118)

## 2021-04-25 MED ORDER — ONDANSETRON HCL 4 MG/2ML IJ SOLN
8.0000 mg | Freq: Once | INTRAMUSCULAR | Status: AC
  Administered 2021-04-25: 8 mg via INTRAVENOUS
  Filled 2021-04-25: qty 4

## 2021-04-25 MED ORDER — SODIUM CHLORIDE 0.9 % IV SOLN
200.0000 mg | Freq: Once | INTRAVENOUS | Status: AC
  Administered 2021-04-25: 200 mg via INTRAVENOUS
  Filled 2021-04-25: qty 8

## 2021-04-25 MED ORDER — SODIUM CHLORIDE 0.9 % IV SOLN: Freq: Once | INTRAVENOUS | Status: AC

## 2021-04-25 MED ORDER — SODIUM CHLORIDE 0.9% FLUSH
10.0000 mL | Freq: Once | INTRAVENOUS | Status: AC
  Administered 2021-04-25: 10 mL

## 2021-04-25 MED ORDER — CYANOCOBALAMIN 1000 MCG/ML IJ SOLN
1000.0000 ug | Freq: Once | INTRAMUSCULAR | Status: AC
  Administered 2021-04-25: 1000 ug via INTRAMUSCULAR
  Filled 2021-04-25: qty 1

## 2021-04-25 MED ORDER — HEPARIN SOD (PORK) LOCK FLUSH 100 UNIT/ML IV SOLN
500.0000 [IU] | Freq: Once | INTRAVENOUS | Status: AC | PRN
  Administered 2021-04-25: 500 [IU]

## 2021-04-25 MED ORDER — SODIUM CHLORIDE 0.9% FLUSH
10.0000 mL | INTRAVENOUS | Status: DC | PRN
  Administered 2021-04-25: 10 mL

## 2021-04-25 MED ORDER — SODIUM CHLORIDE 0.9 % IV SOLN
500.0000 mg/m2 | Freq: Once | INTRAVENOUS | Status: AC
  Administered 2021-04-25: 900 mg via INTRAVENOUS
  Filled 2021-04-25: qty 20

## 2021-04-25 NOTE — Progress Notes (Signed)
[facility] [phone]   [phone]  OFFICE PROGRESS NOTE  [name], NP [place] [address]  DIAGNOSIS: Metastatic non-small cell lung cancer initially diagnosed as stage IIIA (T3, N1, M0) non-small cell lung cancer, adenocarcinoma with no actionable mutations presented with multiple pulmonary nodules in the right upper lobe as well as metastatic disease in intraparenchymal lymphadenopathy.  This was diagnosed in February 2020.  The patient has disease recurrence in March 2022. PD-L1 expression 20%. He has no actionable mutations by foundation 1.  PRIOR THERAPY: 1) Status post right upper lobectomy with lymph node dissection. 2) Adjuvant systemic chemotherapy with cisplatin 75 mg/M2 and Alimta 500 mg/M2 every 3 weeks.  Status post 4 cycles. 3) palliative radiotherapy to the metastatic bone disease and the scapula as well as the left supraclavicular area under the care of Dr. Isidore Moos  CURRENT THERAPY: Systemic chemotherapy with carboplatin for AUC of 5, Alimta 500 mg/M2 and Keytruda 200 mg IV every 3 weeks.  First dose November 06, 2020.  Status post 8 cycles.  Starting from cycle #5 the patient is treated with maintenance Alimta and Keytruda every 3 weeks  INTERVAL HISTORY: KERRINGTON GREENHALGH 65 y.o. male returns to the clinic today for follow-up visit.  The patient is feeling fine today with no concerning complaints except for left shoulder pain after a fall at home when he tripped and the heater cord.  He denied having any current chest pain, shortness of breath, cough or hemoptysis.  He denied having any fever or chills.  He has no nausea, vomiting, diarrhea or constipation.  He has fatigue for several days after the chemotherapy but recovers well.  He is here today for evaluation before starting cycle #9 of his treatment with Alimta and Keytruda.  MEDICAL HISTORY: Past Medical History:  Diagnosis Date   Anginal pain ([place])     CAD (coronary artery disease)    CAP (community acquired pneumonia) 09/2016   COPD (chronic obstructive pulmonary disease) (H[facility])    GERD (gastroesophageal reflux disease)    Heart murmur    "I was told I had one when I was a kid"   Hemorrhoids    History of anal fissures    "no surgeries" (10/30/2016)   Hyperlipidemia    Hypertension    lung ca dx'd 08/2018   Peripheral arterial disease ([facility])    status post right common iliac artery stenting back in 2007   Seasonal allergies    Tobacco abuse     ALLERGIES:  is allergic to compazine [prochlorperazine].  MEDICATIONS:  Current Outpatient Medications  Medication Sig Dispense Refill   amLODipine (NORVASC) 5 MG tablet Take 1 tablet (5 mg total) by mouth daily. 180 tablet 3   aspirin EC 81 MG tablet Take 81 mg by mouth daily.     cetirizine (ZYRTEC) 10 MG tablet Take 10 mg by mouth at bedtime as needed for allergies.     clopidogrel (PLAVIX) 75 MG tablet TAKE 1 TABLET BY MOUTH EVERY DAY 90 tablet 3   esomeprazole (NEXIUM) 20 MG capsule Take 40 mg by mouth daily.     folic acid (FOLVITE) 1 MG tablet TAKE 1 TABLET(1 MG) BY MOUTH DAILY 30 tablet 4   lidocaine-prilocaine (EMLA) cream Apply to the Port-A-Cath site 30-60 minutes before treatment. 30 g 0   Magnesium 500 MG TABS Take 1 tablet (500 mg total) by mouth 3 (three) times daily. 8 tablet 0  metoprolol succinate (TOPROL XL) 25 MG 24 hr tablet Take 1 tablet (25 mg total) by mouth daily. 90 tablet 3   Multiple Vitamin (MULTIVITAMIN WITH MINERALS) TABS tablet Take 1 tablet by mouth daily.     nitroGLYCERIN (NITROSTAT) 0.4 MG SL tablet PLACE 1 TABLET UNDER THE TONGUE EVERY 5 MINUTES AS NEEDED FOR CHEST PAIN. 25 tablet 6   ondansetron (ZOFRAN) 8 MG tablet Take 1 tablet (8 mg total) by mouth every 8 (eight) hours as needed for nausea or vomiting. 20 tablet 0   rosuvastatin (CRESTOR) 5 MG tablet Take 1 tablet (5 mg total) by mouth every other day. 45 tablet 3   umeclidinium-vilanterol (ANORO  ELLIPTA) 62.5-25 MCG/INH AEPB Inhale 1 puff into the lungs daily. 60 each 5   No current facility-administered medications for this visit.    SURGICAL HISTORY:  Past Surgical History:  Procedure Laterality Date   ANKLE SURGERY Left    "rebuilt it"   ANTERIOR CRUCIATE LIGAMENT REPAIR Right    CARDIAC CATHETERIZATION  09/23/2008   Continued medical therapy - may need GI evaluation in addition.   CARDIAC CATHETERIZATION  10/28/2007   Medical therapy recommended.   CARDIAC CATHETERIZATION  11/18/2006   In-stent restenosis RCA  (50% distal edge, 80% segmental mid, and 50-60% segmental proximal). Successful cutting balloon atherectomy using a [id number] cutting balloon. 3 inflations with atherectomy performed on mid and proximal portions resulting in reduction of 80% mid in-stent restenosis to less than 20% residual and 50-60% segmental proximal to less than 20% residual without dissection.   CARDIAC CATHETERIZATION  02/26/2006   Severe stenosis in RCA. Stenting performed using IVUS. 3.5x20 Maverick balloon deployed at [employer]. Distal stent-a 4x28 Liberte stent-deployed 12atm 48sec, 12atm 31sec, 4atm 19sec. Mid stent-a 4x28 Liberte stent-deployed 14atm 45sec, 14atm 60sec, 14atm 44sec. Proximal stent-4x8 Liberte- 14atm 45sec,14atm 47sec, 16atm 43sec. Severely diseased segment then appeared TIMI-3 flow.   CARDIOVASCULAR STRESS TEST  11/17/2012   No significant ECG changes. Septal perfusion defect is new when complared to study from 2010. Abnormal myocardial perfusion imaging with a basal to mid perfusion suggestive of previous MI.   CAROTID DOPPLER  08/09/2011   Bilateral Bulb/Proximal ICA - demonstrated a mild amount of fibrous plaque without evidence of significant diameter reduction reduction or other vascular abnormality.   CHEST TUBE INSERTION Right 09/02/2018   Procedure: Chest Tube Insertion;  Surgeon: Garner Nash, DO;  Location: [facility];  Service: Thoracic;  Laterality: Right;   COLONOSCOPY      2003, 2014   CORONARY ANGIOPLASTY WITH STENT PLACEMENT     FEMORAL ARTERY STENT     INGUINAL HERNIA REPAIR Right    IR IMAGING GUIDED PORT INSERTION  11/13/2020   KNEE ARTHROSCOPY Right "multiple"   LAPAROSCOPIC APPENDECTOMY N/A 12/07/2018   Procedure: APPENDECTOMY LAPAROSCOPIC;  Surgeon: Coralie Keens, MD;  Location: [facility];  Service: General;  Laterality: N/A;   LOWER EXTREMITY ARTERIAL DOPPLER  01/31/2011   Bilateral ABIs-normal values with no suggestion of arterial insuff to the lower extremities at rest. Right CIA stent-mild amount of nonhemodynamically significant plaque is noted throughout   [place]   "bone-eating tumor"   PERCUTANEOUS STENT INTERVENTION  04/04/2006 & 04/13/2015   a. Right common iliac artery with an 8.0x18 mm Herculink stent deployed at 12 atm. Stenosis was reduced from 80% to 0% with brisk flow. b. I-cast stenting to left common iliac artery   PERIPHERAL VASCULAR CATHETERIZATION N/A 04/13/2015   Procedure: Lower Extremity Angiography;  Surgeon: Lorretta Harp, MD; L-oCIA 75%, 40-50% L-EIA, R-CIA stent patent, s/p 8 mm x 38 mm ICast covered stent>>0% stenosis in L-oCIA      SHOULDER ARTHROSCOPY WITH ROTATOR CUFF REPAIR Right    TRANSTHORACIC ECHOCARDIOGRAM  11/26/2012   EF not noted. Aortic valve-sclerosis without stenosis, no regurgiation.    UPPER GASTROINTESTINAL ENDOSCOPY     US CAROTID DOPPLER BILATERAL ([place] HX)  08/09/2011   Bilateral Bulb/Proximal ICAa demonstrated a mild amount of fibrous plaque without evidence of significant diameter reduction or any other vascular abnormality.   VIDEO ASSISTED THORACOSCOPY (VATS)/ LOBECTOMY Right 09/14/2018   Procedure: RIGHT VIDEO ASSISTED THORACOSCOPY (VATS)/ RIGHT UPPER LOBECTOMY;  Surgeon: Melrose Nakayama, MD;  Location: [facility];  Service: Thoracic;  Laterality: Right;   VIDEO BRONCHOSCOPY WITH ENDOBRONCHIAL NAVIGATION N/A 09/02/2018   Procedure: VIDEO BRONCHOSCOPY WITH ENDOBRONCHIAL NAVIGATION;  Surgeon:  Garner Nash, DO;  Location: [facility];  Service: Thoracic;  Laterality: N/A;    REVIEW OF SYSTEMS:  A comprehensive review of systems was negative except for: Constitutional: positive for fatigue Musculoskeletal: positive for bone pain   PHYSICAL EXAMINATION: General appearance: alert, cooperative, fatigued, and no distress Head: Normocephalic, without obvious abnormality, atraumatic Neck: no adenopathy, no JVD, supple, symmetrical, trachea midline, and thyroid not enlarged, symmetric, no tenderness/mass/nodules Lymph nodes: Cervical, supraclavicular, and axillary nodes normal. Resp: clear to auscultation bilaterally Back: symmetric, no curvature. ROM normal. No CVA tenderness. Cardio: regular rate and rhythm, S1, S2 normal, no murmur, click, rub or gallop GI: soft, non-tender; bowel sounds normal; no masses,  no organomegaly Extremities: extremities normal, atraumatic, no cyanosis or edema  ECOG PERFORMANCE STATUS: 1 - Symptomatic but completely ambulatory  Blood pressure (!) 139/93, pulse 73, temperature 98 F (36.7 C), temperature source Oral, resp. rate 18, weight 128 lb 4 oz (58.2 kg), SpO2 100 %.  LABORATORY DATA: Lab Results  Component Value Date   WBC 6.8 04/25/2021   HGB 9.9 (L) 04/25/2021   HCT 28.4 (L) 04/25/2021   MCV 103.3 (H) 04/25/2021   PLT 490 (H) 04/25/2021      Chemistry      Component Value Date/Time   NA 128 (L) 04/03/2021 1118   NA 130 (L) 03/05/2017 1110   K 3.9 04/03/2021 1118   CL 93 (L) 04/03/2021 1118   CO2 23 04/03/2021 1118   BUN 6 (L) 04/03/2021 1118   BUN 4 (L) 03/05/2017 1110   CREATININE 0.97 04/03/2021 1118   CREATININE 0.76 04/28/2020 0826      Component Value Date/Time   CALCIUM 9.2 04/03/2021 1118   ALKPHOS 94 04/03/2021 1118   AST 36 04/03/2021 1118   ALT 19 04/03/2021 1118   BILITOT 0.6 04/03/2021 1118       RADIOGRAPHIC STUDIES: No results found.  ASSESSMENT AND PLAN: This is a very pleasant 65 years old white male with  a stage IIIB non-small cell lung cancer, adenocarcinoma with no actionable mutations status post right upper lobectomy with lymph node dissection under the care of [name]. The patient completed a course of adjuvant treatment with cisplatin and Alimta status post 4 cycles.  He tolerated the previous 4 cycles of his treatment fairly well. The patient has been in observation but the recent imaging studies showed suspicious bone lesion in the left shoulder area. I ordered a PET scan which was performed on 09/25/2020 and it showed metastatic disease involving the left scapula and left supraclavicular lymph nodes. The patient had ultrasound-guided core biopsy of the  left supraclavicular lymph node and it was positive for metastatic carcinoma. He underwent palliative radiotherapy to the metastatic bone lesion in the left scapula and he is feeling much better. PD-L1 expression 20%. He was tested in the past for molecular study by foundation 1 and it was reported to be negative for actionable mutations. The patient started systemic chemotherapy with carboplatin for AUC of 5, Alimta 500 Mg/M2 and Keytruda 200 Mg IV every 3 weeks status post 8 cycles.  Starting from cycle #5 he is treated with maintenance Alimta and Keytruda every 3 weeks. The patient has been tolerating his treatment with maintenance Alimta and Keytruda fairly well except for mild fatigue. I recommended for him to proceed with cycle #9 today as planned. I will see him back for follow-up visit in 3 weeks for evaluation with repeat CT scan of the chest, abdomen pelvis for restaging of his disease. For the chemotherapy-induced anemia, will continue to monitor his hemoglobin and hematocrit closely and consider The patient for transfusion if needed. He was advised to call immediately if he has any other concerning symptoms in the interval. The patient voices understanding of current disease status and treatment options and is in agreement with  the current care plan. All questions were answered. The patient knows to call the clinic with any problems, questions or concerns. We can certainly see the patient much sooner if necessary.  Disclaimer: This note was dictated with voice recognition software. Similar sounding words can inadvertently be transcribed and may not be corrected upon review.

## 2021-04-25 NOTE — Patient Instructions (Signed)
[facility]   Discharge Instructions: Thank you for choosing [facility] to provide your oncology and hematology care.   If you have a lab appointment with the [facility], please go directly to the [facility] and check in at the registration area.   Wear comfortable clothing and clothing appropriate for easy access to any Portacath or PICC line.   We strive to give you quality time with your provider. You may need to reschedule your appointment if you arrive late (15 or more minutes).  Arriving late affects you and other patients whose appointments are after yours.  Also, if you miss three or more appointments without notifying the office, you may be dismissed from the clinic at the provider's discretion.      For prescription refill requests, have your pharmacy contact our office and allow 72 hours for refills to be completed.    Today you received the following chemotherapy and/or immunotherapy agents: Alimta and Keytruda.       To help prevent nausea and vomiting after your treatment, we encourage you to take your nausea medication as directed.  BELOW ARE SYMPTOMS THAT SHOULD BE REPORTED IMMEDIATELY: *FEVER GREATER THAN 100.4 F (38 C) OR HIGHER *CHILLS OR SWEATING *NAUSEA AND VOMITING THAT IS NOT CONTROLLED WITH YOUR NAUSEA MEDICATION *UNUSUAL SHORTNESS OF BREATH *UNUSUAL BRUISING OR BLEEDING *URINARY PROBLEMS (pain or burning when urinating, or frequent urination) *BOWEL PROBLEMS (unusual diarrhea, constipation, pain near the anus) TENDERNESS IN MOUTH AND THROAT WITH OR WITHOUT PRESENCE OF ULCERS (sore throat, sores in mouth, or a toothache) UNUSUAL RASH, SWELLING OR PAIN  UNUSUAL VAGINAL DISCHARGE OR ITCHING   Items with * indicate a potential emergency and should be followed up as soon as possible or go to the Emergency Department if any problems should occur.  Please show the CHEMOTHERAPY ALERT CARD or IMMUNOTHERAPY ALERT CARD  at check-in to the Emergency Department and triage nurse.  Should you have questions after your visit or need to cancel or reschedule your appointment, please contact [facility]  Dept: [phone]  and follow the prompts.  Office hours are 8:00 a.m. to 4:30 p.m. Monday - Friday. Please note that voicemails left after 4:00 p.m. may not be returned until the following business day.  We are closed weekends and major holidays. You have access to a nurse at all times for urgent questions. Please call the main number to the clinic Dept: [phone] and follow the prompts.   For any non-urgent questions, you may also contact your provider using MyChart. We now offer e-Visits for anyone 65 and older to request care online for non-urgent symptoms. For details visit [email or web address].   Also download the MyChart app! Go to the app store, search "MyChart", open the app, select [facility], and log in with your MyChart username and password.  Due to Covid, a mask is required upon entering the hospital/clinic. If you do not have a mask, one will be given to you upon arrival. For doctor visits, patients may have 1 support person aged 65 or older with them. For treatment visits, patients cannot have anyone with them due to current Covid guidelines and our immunocompromised population.

## 2021-05-09 ENCOUNTER — Other Ambulatory Visit: Payer: Self-pay | Admitting: Medical Oncology

## 2021-05-09 ENCOUNTER — Inpatient Hospital Stay: Payer: BC Managed Care – PPO

## 2021-05-09 ENCOUNTER — Inpatient Hospital Stay: Payer: BC Managed Care – PPO | Attending: Internal Medicine

## 2021-05-09 ENCOUNTER — Telehealth: Payer: Self-pay | Admitting: Medical Oncology

## 2021-05-09 ENCOUNTER — Inpatient Hospital Stay (HOSPITAL_BASED_OUTPATIENT_CLINIC_OR_DEPARTMENT_OTHER): Payer: BC Managed Care – PPO | Admitting: Internal Medicine

## 2021-05-09 ENCOUNTER — Encounter: Payer: Self-pay | Admitting: Internal Medicine

## 2021-05-09 ENCOUNTER — Other Ambulatory Visit: Payer: Self-pay

## 2021-05-09 VITALS — BP 127/79 | HR 65 | Resp 16

## 2021-05-09 VITALS — BP 118/87 | HR 92 | Temp 97.8°F | Resp 17 | Ht 68.5 in | Wt 120.2 lb

## 2021-05-09 DIAGNOSIS — R197 Diarrhea, unspecified: Secondary | ICD-10-CM

## 2021-05-09 DIAGNOSIS — Z95828 Presence of other vascular implants and grafts: Secondary | ICD-10-CM

## 2021-05-09 DIAGNOSIS — Z79899 Other long term (current) drug therapy: Secondary | ICD-10-CM | POA: Diagnosis not present

## 2021-05-09 DIAGNOSIS — C3411 Malignant neoplasm of upper lobe, right bronchus or lung: Secondary | ICD-10-CM | POA: Insufficient documentation

## 2021-05-09 DIAGNOSIS — Z5112 Encounter for antineoplastic immunotherapy: Secondary | ICD-10-CM | POA: Diagnosis not present

## 2021-05-09 DIAGNOSIS — R112 Nausea with vomiting, unspecified: Secondary | ICD-10-CM

## 2021-05-09 DIAGNOSIS — D539 Nutritional anemia, unspecified: Secondary | ICD-10-CM | POA: Diagnosis not present

## 2021-05-09 DIAGNOSIS — C3491 Malignant neoplasm of unspecified part of right bronchus or lung: Secondary | ICD-10-CM | POA: Diagnosis not present

## 2021-05-09 DIAGNOSIS — D649 Anemia, unspecified: Secondary | ICD-10-CM | POA: Insufficient documentation

## 2021-05-09 DIAGNOSIS — Z902 Acquired absence of lung [part of]: Secondary | ICD-10-CM | POA: Insufficient documentation

## 2021-05-09 DIAGNOSIS — E860 Dehydration: Secondary | ICD-10-CM | POA: Diagnosis not present

## 2021-05-09 DIAGNOSIS — Z5111 Encounter for antineoplastic chemotherapy: Secondary | ICD-10-CM | POA: Insufficient documentation

## 2021-05-09 DIAGNOSIS — C778 Secondary and unspecified malignant neoplasm of lymph nodes of multiple regions: Secondary | ICD-10-CM | POA: Insufficient documentation

## 2021-05-09 DIAGNOSIS — C7951 Secondary malignant neoplasm of bone: Secondary | ICD-10-CM | POA: Insufficient documentation

## 2021-05-09 LAB — CMP (CANCER CENTER ONLY)
ALT: 66 U/L — ABNORMAL HIGH (ref 0–44)
AST: 86 U/L — ABNORMAL HIGH (ref 15–41)
Albumin: 3.4 g/dL — ABNORMAL LOW (ref 3.5–5.0)
Alkaline Phosphatase: 90 U/L (ref 38–126)
Anion gap: 11 (ref 5–15)
BUN: 12 mg/dL (ref 8–23)
CO2: 33 mmol/L — ABNORMAL HIGH (ref 22–32)
Calcium: 9.2 mg/dL (ref 8.9–10.3)
Chloride: 86 mmol/L — ABNORMAL LOW (ref 98–111)
Creatinine: 1.66 mg/dL — ABNORMAL HIGH (ref 0.61–1.24)
GFR, Estimated: 45 mL/min — ABNORMAL LOW (ref >60)
Glucose, Bld: 107 mg/dL — ABNORMAL HIGH (ref 70–99)
Potassium: 3.1 mmol/L — ABNORMAL LOW (ref 3.5–5.1)
Sodium: 130 mmol/L — ABNORMAL LOW (ref 135–145)
Total Bilirubin: 0.7 mg/dL (ref 0.3–1.2)
Total Protein: 7.4 g/dL (ref 6.5–8.1)

## 2021-05-09 LAB — CBC WITH DIFFERENTIAL (CANCER CENTER ONLY)
Abs Immature Granulocytes: 0.02 10*3/uL (ref 0.00–0.07)
Basophils Absolute: 0 10*3/uL (ref 0.0–0.1)
Basophils Relative: 0 %
Eosinophils Absolute: 0 10*3/uL (ref 0.0–0.5)
Eosinophils Relative: 1 %
HCT: 24.1 % — ABNORMAL LOW (ref 39.0–52.0)
Hemoglobin: 8.9 g/dL — ABNORMAL LOW (ref 13.0–17.0)
Immature Granulocytes: 0 %
Lymphocytes Relative: 39 %
Lymphs Abs: 2 10*3/uL (ref 0.7–4.0)
MCH: 36.8 pg — ABNORMAL HIGH (ref 26.0–34.0)
MCHC: 36.9 g/dL — ABNORMAL HIGH (ref 30.0–36.0)
MCV: 99.6 fL (ref 80.0–100.0)
Monocytes Absolute: 1 10*3/uL (ref 0.1–1.0)
Monocytes Relative: 20 %
Neutro Abs: 2.1 10*3/uL (ref 1.7–7.7)
Neutrophils Relative %: 40 %
Platelet Count: 167 10*3/uL (ref 150–400)
RBC: 2.42 MIL/uL — ABNORMAL LOW (ref 4.22–5.81)
RDW: 15.9 % — ABNORMAL HIGH (ref 11.5–15.5)
WBC Count: 5.2 10*3/uL (ref 4.0–10.5)
nRBC: 0 % (ref 0.0–0.2)

## 2021-05-09 LAB — RETICULOCYTES
Immature Retic Fract: 32.2 % — ABNORMAL HIGH (ref 2.3–15.9)
RBC.: 2.32 MIL/uL — ABNORMAL LOW (ref 4.22–5.81)
Retic Count, Absolute: 26 10*3/uL (ref 19.0–186.0)
Retic Ct Pct: 1.1 % (ref 0.4–3.1)

## 2021-05-09 LAB — IRON AND TIBC
Iron: 72 ug/dL (ref 45–182)
Saturation Ratios: 30 % (ref 17.9–39.5)
TIBC: 237 ug/dL — ABNORMAL LOW (ref 250–450)
UIBC: 165 ug/dL

## 2021-05-09 LAB — VITAMIN B12: Vitamin B-12: 934 pg/mL — ABNORMAL HIGH (ref 180–914)

## 2021-05-09 LAB — FOLATE: Folate: 79.8 ng/mL (ref >5.9)

## 2021-05-09 LAB — SAMPLE TO BLOOD BANK

## 2021-05-09 LAB — FERRITIN: Ferritin: 2108 ng/mL — ABNORMAL HIGH (ref 24–336)

## 2021-05-09 MED ORDER — SODIUM CHLORIDE 0.9% FLUSH
10.0000 mL | Freq: Once | INTRAVENOUS | Status: AC
  Administered 2021-05-09: 10 mL

## 2021-05-09 MED ORDER — ONDANSETRON HCL 40 MG/20ML IJ SOLN
8.0000 mg | Freq: Once | INTRAMUSCULAR | Status: AC
  Administered 2021-05-09: 8 mg via INTRAVENOUS
  Filled 2021-05-09: qty 4

## 2021-05-09 MED ORDER — HEPARIN SOD (PORK) LOCK FLUSH 100 UNIT/ML IV SOLN
500.0000 [IU] | Freq: Once | INTRAVENOUS | Status: AC
  Administered 2021-05-09: 500 [IU]

## 2021-05-09 MED ORDER — SODIUM CHLORIDE 0.9 % IV SOLN: INTRAVENOUS | Status: AC

## 2021-05-09 NOTE — Progress Notes (Signed)
[facility] [phone]   [phone]  OFFICE PROGRESS NOTE  [name], NP [place] [address]  DIAGNOSIS: Metastatic non-small cell lung cancer initially diagnosed as stage IIIA (T3, N1, M0) non-small cell lung cancer, adenocarcinoma with no actionable mutations presented with multiple pulmonary nodules in the right upper lobe as well as metastatic disease in intraparenchymal lymphadenopathy.  This was diagnosed in February 2020.  The patient has disease recurrence in March 2022. PD-L1 expression 20%. He has no actionable mutations by foundation 1.  PRIOR THERAPY: 1) Status post right upper lobectomy with lymph node dissection. 2) Adjuvant systemic chemotherapy with cisplatin 75 mg/M2 and Alimta 500 mg/M2 every 3 weeks.  Status post 4 cycles. 3) palliative radiotherapy to the metastatic bone disease and the scapula as well as the left supraclavicular area under the care of Dr. Isidore Moos  CURRENT THERAPY: Systemic chemotherapy with carboplatin for AUC of 5, Alimta 500 mg/M2 and Keytruda 200 mg IV every 3 weeks.  First dose November 06, 2020.  Status post 9 cycles.  Starting from cycle #5 the patient is treated with maintenance Alimta and Keytruda every 3 weeks  INTERVAL HISTORY: Sean Rose 65 y.o. male returns to the clinic today for a walk and visit and symptom management.  The patient mentioned that last week he started having a lot of nausea and vomiting as well as diarrhea every time he eats food.  He also has acidic taste.  He is currently on Nexium with no improvement of his symptoms.  His last colonoscopy was in 2014 by Dr. Carlean Purl.  He also has intermittent abdominal pain in addition to weakness and lightheadedness.  He tried Zofran at home with no improvement.  He came today for evaluation and recommendation regarding his condition.  He lost several pounds in the last several days.  He denied having any current fever or  chills.  He has no chest pain, shortness of breath but has mild cough with no hemoptysis.  MEDICAL HISTORY: Past Medical History:  Diagnosis Date   Anginal pain (No Name)    CAD (coronary artery disease)    CAP (community acquired pneumonia) 09/2016   COPD (chronic obstructive pulmonary disease) (HCC)    GERD (gastroesophageal reflux disease)    Heart murmur    "I was told I had one when I was a kid"   Hemorrhoids    History of anal fissures    "no surgeries" (10/30/2016)   Hyperlipidemia    Hypertension    lung ca dx'd 08/2018   Peripheral arterial disease ([place])    status post right common iliac artery stenting back in 2007   Seasonal allergies    Tobacco abuse     ALLERGIES:  is allergic to compazine [prochlorperazine].  MEDICATIONS:  Current Outpatient Medications  Medication Sig Dispense Refill   amLODipine (NORVASC) 5 MG tablet Take 1 tablet (5 mg total) by mouth daily. 180 tablet 3   aspirin EC 81 MG tablet Take 81 mg by mouth daily.     cetirizine (ZYRTEC) 10 MG tablet Take 10 mg by mouth at bedtime as needed for allergies.     clopidogrel (PLAVIX) 75 MG tablet TAKE 1 TABLET BY MOUTH EVERY DAY 90 tablet 3   esomeprazole (NEXIUM) 20 MG capsule Take 40 mg by mouth daily.     folic acid (FOLVITE) 1 MG tablet TAKE 1 TABLET(1 MG) BY MOUTH DAILY 30 tablet 4   lidocaine-prilocaine (EMLA)  cream Apply to the Port-A-Cath site 30-60 minutes before treatment. 30 g 0   Magnesium 500 MG TABS Take 1 tablet (500 mg total) by mouth 3 (three) times daily. 8 tablet 0   metoprolol succinate (TOPROL XL) 25 MG 24 hr tablet Take 1 tablet (25 mg total) by mouth daily. 90 tablet 3   Multiple Vitamin (MULTIVITAMIN WITH MINERALS) TABS tablet Take 1 tablet by mouth daily.     nitroGLYCERIN (NITROSTAT) 0.4 MG SL tablet PLACE 1 TABLET UNDER THE TONGUE EVERY 5 MINUTES AS NEEDED FOR CHEST PAIN. 25 tablet 6   ondansetron (ZOFRAN) 8 MG tablet Take 1 tablet (8 mg total) by mouth every 8 (eight) hours as needed  for nausea or vomiting. 20 tablet 0   rosuvastatin (CRESTOR) 5 MG tablet Take 1 tablet (5 mg total) by mouth every other day. 45 tablet 3   umeclidinium-vilanterol (ANORO ELLIPTA) 62.5-25 MCG/INH AEPB Inhale 1 puff into the lungs daily. 60 each 5   No current facility-administered medications for this visit.    SURGICAL HISTORY:  Past Surgical History:  Procedure Laterality Date   ANKLE SURGERY Left    "rebuilt it"   ANTERIOR CRUCIATE LIGAMENT REPAIR Right    CARDIAC CATHETERIZATION  09/23/2008   Continued medical therapy - may need GI evaluation in addition.   CARDIAC CATHETERIZATION  10/28/2007   Medical therapy recommended.   CARDIAC CATHETERIZATION  11/18/2006   In-stent restenosis RCA  (50% distal edge, 80% segmental mid, and 50-60% segmental proximal). Successful cutting balloon atherectomy using a [id number] cutting balloon. 3 inflations with atherectomy performed on mid and proximal portions resulting in reduction of 80% mid in-stent restenosis to less than 20% residual and 50-60% segmental proximal to less than 20% residual without dissection.   CARDIAC CATHETERIZATION  02/26/2006   Severe stenosis in RCA. Stenting performed using IVUS. 3.5x20 Maverick balloon deployed at [employer]. Distal stent-a 4x28 Liberte stent-deployed 12atm 48sec, 12atm 31sec, 4atm 19sec. Mid stent-a 4x28 Liberte stent-deployed 14atm 45sec, 14atm 60sec, 14atm 44sec. Proximal stent-4x8 Liberte- 14atm 45sec,14atm 47sec, 16atm 43sec. Severely diseased segment then appeared TIMI-3 flow.   CARDIOVASCULAR STRESS TEST  11/17/2012   No significant ECG changes. Septal perfusion defect is new when complared to study from 2010. Abnormal myocardial perfusion imaging with a basal to mid perfusion suggestive of previous MI.   CAROTID DOPPLER  08/09/2011   Bilateral Bulb/Proximal ICA - demonstrated a mild amount of fibrous plaque without evidence of significant diameter reduction reduction or other vascular abnormality.   CHEST TUBE  INSERTION Right 09/02/2018   Procedure: Chest Tube Insertion;  Surgeon: Garner Nash, DO;  Location: [facility];  Service: Thoracic;  Laterality: Right;   COLONOSCOPY     2003, 2014   CORONARY ANGIOPLASTY WITH STENT PLACEMENT     FEMORAL ARTERY STENT     INGUINAL HERNIA REPAIR Right    IR IMAGING GUIDED PORT INSERTION  11/13/2020   KNEE ARTHROSCOPY Right "multiple"   LAPAROSCOPIC APPENDECTOMY N/A 12/07/2018   Procedure: APPENDECTOMY LAPAROSCOPIC;  Surgeon: Coralie Keens, MD;  Location: [facility];  Service: General;  Laterality: N/A;   LOWER EXTREMITY ARTERIAL DOPPLER  01/31/2011   Bilateral ABIs-normal values with no suggestion of arterial insuff to the lower extremities at rest. Right CIA stent-mild amount of nonhemodynamically significant plaque is noted throughout   [place]   "bone-eating tumor"   PERCUTANEOUS STENT INTERVENTION  04/04/2006 & 04/13/2015   a. Right common iliac artery with an 8.0x18 mm Herculink stent  deployed at 12 atm. Stenosis was reduced from 80% to 0% with brisk flow. b. I-cast stenting to left common iliac artery   PERIPHERAL VASCULAR CATHETERIZATION N/A 04/13/2015   Procedure: Lower Extremity Angiography;  Surgeon: Lorretta Harp, MD; L-oCIA 75%, 40-50% L-EIA, R-CIA stent patent, s/p 8 mm x 38 mm ICast covered stent>>0% stenosis in [place] Right    TRANSTHORACIC ECHOCARDIOGRAM  11/26/2012   EF not noted. Aortic valve-sclerosis without stenosis, no regurgiation.    UPPER GASTROINTESTINAL ENDOSCOPY     US CAROTID DOPPLER BILATERAL ([place] HX)  08/09/2011   Bilateral Bulb/Proximal ICAa demonstrated a mild amount of fibrous plaque without evidence of significant diameter reduction or any other vascular abnormality.   VIDEO ASSISTED THORACOSCOPY (VATS)/ LOBECTOMY Right 09/14/2018   Procedure: RIGHT VIDEO ASSISTED THORACOSCOPY (VATS)/ RIGHT UPPER LOBECTOMY;  Surgeon: Melrose Nakayama, MD;  Location: [facility];   Service: Thoracic;  Laterality: Right;   VIDEO BRONCHOSCOPY WITH ENDOBRONCHIAL NAVIGATION N/A 09/02/2018   Procedure: VIDEO BRONCHOSCOPY WITH ENDOBRONCHIAL NAVIGATION;  Surgeon: Garner Nash, DO;  Location: [facility];  Service: Thoracic;  Laterality: N/A;    REVIEW OF SYSTEMS:  Constitutional: positive for anorexia, fatigue, and weight loss Eyes: negative Ears, nose, mouth, throat, and face: negative Respiratory: positive for cough Cardiovascular: negative Gastrointestinal: positive for abdominal pain, diarrhea, nausea, and vomiting Genitourinary:negative Integument/breast: negative Hematologic/lymphatic: negative Musculoskeletal:negative Neurological: negative Behavioral/Psych: negative Endocrine: negative Allergic/Immunologic: negative   PHYSICAL EXAMINATION: General appearance: alert, cooperative, fatigued, and no distress Head: Normocephalic, without obvious abnormality, atraumatic Neck: no adenopathy, no JVD, supple, symmetrical, trachea midline, and thyroid not enlarged, symmetric, no tenderness/mass/nodules Lymph nodes: Cervical, supraclavicular, and axillary nodes normal. Resp: clear to auscultation bilaterally Back: symmetric, no curvature. ROM normal. No CVA tenderness. Cardio: regular rate and rhythm, S1, S2 normal, no murmur, click, rub or gallop GI: soft, non-tender; bowel sounds normal; no masses,  no organomegaly Extremities: extremities normal, atraumatic, no cyanosis or edema Neurologic: Alert and oriented X 3, normal strength and tone. Normal symmetric reflexes. Normal coordination and gait  ECOG PERFORMANCE STATUS: 1 - Symptomatic but completely ambulatory  Blood pressure 118/87, pulse 92, temperature 97.8 F (36.6 C), temperature source Tympanic, resp. rate 17, height 5' 8.5" (1.74 m), weight 120 lb 3.2 oz (54.5 kg), SpO2 99 %.  LABORATORY DATA: Lab Results  Component Value Date   WBC 6.8 04/25/2021   HGB 9.9 (L) 04/25/2021   HCT 28.4 (L) 04/25/2021   MCV  103.3 (H) 04/25/2021   PLT 490 (H) 04/25/2021      Chemistry      Component Value Date/Time   NA 130 (L) 04/25/2021 0958   NA 130 (L) 03/05/2017 1110   K 3.9 04/25/2021 0958   CL 94 (L) 04/25/2021 0958   CO2 25 04/25/2021 0958   BUN 7 (L) 04/25/2021 0958   BUN 4 (L) 03/05/2017 1110   CREATININE 1.05 04/25/2021 0958   CREATININE 0.76 04/28/2020 0826      Component Value Date/Time   CALCIUM 9.3 04/25/2021 0958   ALKPHOS 96 04/25/2021 0958   AST 38 04/25/2021 0958   ALT 24 04/25/2021 0958   BILITOT 0.5 04/25/2021 0958       RADIOGRAPHIC STUDIES: No results found.  ASSESSMENT AND PLAN: This is a very pleasant 64 years old white male with a stage IIIB non-small cell lung cancer, adenocarcinoma with no actionable mutations status post right upper lobectomy with lymph node dissection under  the care of [name]. The patient completed a course of adjuvant treatment with cisplatin and Alimta status post 4 cycles.  He tolerated the previous 4 cycles of his treatment fairly well. The patient has been in observation but the recent imaging studies showed suspicious bone lesion in the left shoulder area. I ordered a PET scan which was performed on 09/25/2020 and it showed metastatic disease involving the left scapula and left supraclavicular lymph nodes. The patient had ultrasound-guided core biopsy of the left supraclavicular lymph node and it was positive for metastatic carcinoma. He underwent palliative radiotherapy to the metastatic bone lesion in the left scapula and he is feeling much better. PD-L1 expression 20%. He was tested in the past for molecular study by foundation 1 and it was reported to be negative for actionable mutations. The patient started systemic chemotherapy with carboplatin for AUC of 5, Alimta 500 Mg/M2 and Keytruda 200 Mg IV every 3 weeks status post 9 cycles.  Starting from cycle #5 he is treated with maintenance Alimta and Keytruda every 3 weeks. The patient  has been tolerating his treatment with this regimen until last week when he started having a lot of nausea and vomiting as well as diarrhea and abdominal pain.  He is currently on less chemo than before and his symptoms could be related to the chemotherapy but less likely considering his tolerance to a harsher treatment in the past. He also continues to have declining in his hemoglobin and hematocrit over the last several months. I will arrange for the patient to have lab work to check any other etiology of his anemia besides the chemotherapy. I will also refer him urgently to gastroenterology for evaluation and consideration of upper endoscopy and colonoscopy if needed. For the dehydration and nausea and vomiting, I will arrange for the patient to receive IV fluids with normal saline and Zofran today. The patient will come back for follow-up visit next week for evaluation with repeat CT scan of the chest, abdomen pelvis for restaging of his disease. He was advised to call immediately or go to the emergency room if he has no improvement in his symptoms or worsening of his condition. The patient voices understanding of current disease status and treatment options and is in agreement with the current care plan. All questions were answered. The patient knows to call the clinic with any problems, questions or concerns. We can certainly see the patient much sooner if necessary.  Disclaimer: This note was dictated with voice recognition software. Similar sounding words can inadvertently be transcribed and may not be corrected upon review.

## 2021-05-09 NOTE — Telephone Encounter (Signed)
Weak, nausea,vomiting ,diarrhea since Monday. Per [name] labs ordered. Schedule message sent

## 2021-05-09 NOTE — Patient Instructions (Signed)

## 2021-05-11 ENCOUNTER — Encounter: Payer: Self-pay | Admitting: Internal Medicine

## 2021-05-11 ENCOUNTER — Telehealth: Payer: Self-pay | Admitting: Internal Medicine

## 2021-05-11 ENCOUNTER — Telehealth: Payer: Self-pay | Admitting: Medical Oncology

## 2021-05-11 NOTE — Telephone Encounter (Signed)
Pt was scheduled for an appt with Dr. Carlean Purl on 05/14/2021 @ 3:50. Pt Made aware. Pt verbalized understanding with all questions answered.

## 2021-05-11 NOTE — Telephone Encounter (Signed)
Dr. Lelon Mast office called regarding having patient seen urgently.  I scheduled with Nevin Bloodgood on 11/3, which is the first opening I had on the schedule.  Pt is on chemo, having nausea/ vomiting/diarrhea/weight loss as well as intermittent abdominal pain.  His RBC is trending downward and they want him seen sooner rather than later.  I am calling patient to advise him of the 11/3 appointment, but if there is anything you can do for him sooner, please advise him.  Thank you.

## 2021-05-11 NOTE — Telephone Encounter (Signed)
Just spoke with patient.  He said he needs to be seen asap because he has stage 4 lung cancer and their fear is that it has spread into his bowel/stomach area.  Thank you.

## 2021-05-11 NOTE — Telephone Encounter (Signed)
gastric reflux . Nexium 40 mg/day does not help.  Hemoccult card - he cannot submit these because he has liquid stools. Urgent GI referral - Appt on Nov 3rd w / PA for  Dr Carlean Purl. The scheduler is going to talk to [facility] nurse to get it moved up.

## 2021-05-11 NOTE — Telephone Encounter (Addendum)
GERD- Per Dr Alen Blew, I told pt he can take tums in addition to nexium.  If that does not work , he can take nexium 40 mg bid per [facility].  I instructed pt to go to ER for worsening symptoms. He voiced understanding.

## 2021-05-14 ENCOUNTER — Ambulatory Visit (HOSPITAL_COMMUNITY)
Admission: RE | Admit: 2021-05-14 | Discharge: 2021-05-14 | Disposition: A | Discharge status: Home / Self Care | Payer: BC Managed Care – PPO | Source: Ambulatory Visit | Attending: Internal Medicine | Admitting: Internal Medicine

## 2021-05-14 ENCOUNTER — Ambulatory Visit (INDEPENDENT_AMBULATORY_CARE_PROVIDER_SITE_OTHER): Payer: BC Managed Care – PPO | Admitting: Internal Medicine

## 2021-05-14 ENCOUNTER — Encounter: Payer: Self-pay | Admitting: Internal Medicine

## 2021-05-14 VITALS — BP 112/60 | HR 72 | Ht 66.0 in | Wt 122.2 lb

## 2021-05-14 DIAGNOSIS — J439 Emphysema, unspecified: Secondary | ICD-10-CM | POA: Diagnosis not present

## 2021-05-14 DIAGNOSIS — C3490 Malignant neoplasm of unspecified part of unspecified bronchus or lung: Secondary | ICD-10-CM | POA: Diagnosis not present

## 2021-05-14 DIAGNOSIS — R634 Abnormal weight loss: Secondary | ICD-10-CM | POA: Diagnosis not present

## 2021-05-14 DIAGNOSIS — R933 Abnormal findings on diagnostic imaging of other parts of digestive tract: Secondary | ICD-10-CM

## 2021-05-14 DIAGNOSIS — N2889 Other specified disorders of kidney and ureter: Secondary | ICD-10-CM | POA: Diagnosis not present

## 2021-05-14 DIAGNOSIS — R112 Nausea with vomiting, unspecified: Secondary | ICD-10-CM | POA: Diagnosis not present

## 2021-05-14 DIAGNOSIS — J90 Pleural effusion, not elsewhere classified: Secondary | ICD-10-CM | POA: Diagnosis not present

## 2021-05-14 DIAGNOSIS — R197 Diarrhea, unspecified: Secondary | ICD-10-CM

## 2021-05-14 DIAGNOSIS — Z7902 Long term (current) use of antithrombotics/antiplatelets: Secondary | ICD-10-CM

## 2021-05-14 DIAGNOSIS — C3491 Malignant neoplasm of unspecified part of right bronchus or lung: Secondary | ICD-10-CM | POA: Diagnosis not present

## 2021-05-14 MED ORDER — HEPARIN SOD (PORK) LOCK FLUSH 100 UNIT/ML IV SOLN
500.0000 [IU] | Freq: Once | INTRAVENOUS | Status: AC
  Administered 2021-05-14: 500 [IU] via INTRAVENOUS

## 2021-05-14 MED ORDER — IOHEXOL 350 MG/ML SOLN
60.0000 mL | Freq: Once | INTRAVENOUS | Status: AC | PRN
  Administered 2021-05-14: 60 mL via INTRAVENOUS

## 2021-05-14 MED ORDER — HEPARIN SOD (PORK) LOCK FLUSH 100 UNIT/ML IV SOLN
INTRAVENOUS | Status: AC
  Filled 2021-05-14: qty 5

## 2021-05-14 NOTE — Progress Notes (Signed)
Sean Rose 65 y.o. Jun 19, 1956 [id number] Referred by Dr. Earlie Server Assessment & Plan:   Encounter Diagnoses  Name Primary?   Nausea and vomiting, unspecified vomiting type Yes   Diarrhea, unspecified type    Loss of weight    Adenocarcinoma of right lung, stage 4 (HCC)    Long term current use of antithrombotics/antiplatelets-clopidogrel      Cause of his problems are not clear.  His stomach looks abnormal to me on the CT scan he had today but you need to wait for those results.  When I have him in the office I told him I did not think his medications were culprit here, however he is on Keytruda.  That is a checkpoint inhibitor which can cause GI toxicity though it is typically a colitis.  However gastritis has been reported.  We will need to keep that in mind as a possible cause.  I would lean towards doing an upper endoscopy, he is interested in a colonoscopy as well mainly because his mother had colon cancer.  In the setting of ongoing chemotherapy for stage IV lung cancer I do not think a routine screening colonoscopy makes sense that we may need to do 1 to evaluate his problems depending upon the clinical course.  We do not need to hold his clopidogrel for an EGD in my opinion though I would do so for a colonoscopy.  Fortunately he is a bit better.  We will come up with a final plan once the CT results are in.  Copy to Lorna Few, MD  CT does show very thickened gastric wall Will recommend EGD Subjective:   Chief Complaint: Nausea vomiting, diarrhea weight loss in the setting of lung cancer  HPI 65 year old white man with metastatic lung cancer under therapy, who presented to oncology last week with a several day history of nausea and vomiting and some diarrhea as well as weight loss.  He says he is lost about 30 pounds since the spring of this year.  He was not looking well Dr. Earlie Server ordered a CT of the chest abdomen and pelvis which is not read out yet.  I did  look at the scans the stomach wall looks thickened to me I think rather diffusely I do not know if this is under distention or some other issue.  Fortunately the patient is improving.  He remains concerned about these issues and though he says diarrhea is chronic for him he is concerned because "my mom had colon cancer".  He reports that last week if he tried to eat solid food he vomited and if he had fluids he had diarrhea though he says his stools essentially are very loose chronically.  He has had been able to tolerate some food over the past couple of days and has gained a couple of pounds.  No fever or chills reported.  No change in medications.    He had chest pain issues and had CT chest abdomen pelvis in August at the ER.  Results below and I personally viewed these as well and the stomach looks normal with a thin wall.  IMPRESSION: 1. No evidence of pulmonary embolism. 2. Known metastatic lesion the left scapula demonstrates some interval increase in sclerosis suggesting a positive response to therapy. There appears to be some evolving postradiation changes in the lungs, as above. 3. No new sites of metastatic disease noted elsewhere in the chest, abdomen or pelvis. 4. Diffuse bronchial wall thickening with moderate to severe  centrilobular and paraseptal emphysema; imaging findings suggestive of underlying COPD. 5. Aortic atherosclerosis, in addition to left main and 2 vessel coronary artery disease. Please note that although the presence of coronary artery calcium documents the presence of coronary artery disease, the severity of this disease and any potential stenosis cannot be assessed on this non-gated CT examination. Assessment for potential risk factor modification, dietary therapy or pharmacologic therapy may be warranted, if clinically indicated. 6. There are calcifications of the mitral annulus. Echocardiographic correlation for evaluation of potential valvular dysfunction  may be warranted if clinically indicated. 7. Additional incidental findings, as above.  CBC Latest Ref Rng & Units 05/09/2021 04/25/2021 04/11/2021  WBC 4.0 - 10.5 K/uL 5.2 6.8 2.7(L)  Hemoglobin 13.0 - 17.0 g/dL 8.9(L) 9.9(L) 10.0(L)  Hematocrit 39.0 - 52.0 % 24.1(L) 28.4(L) 28.0(L)  Platelets 150 - 400 K/uL 167 490(H) 215   Lab Results  Component Value Date   CREATININE 1.66 (H) 05/09/2021   BUN 12 05/09/2021   NA 130 (L) 05/09/2021   K 3.1 (L) 05/09/2021   CL 86 (L) 05/09/2021   CO2 33 (H) 05/09/2021   Lab Results  Component Value Date   ALT 66 (H) 05/09/2021   AST 86 (H) 05/09/2021   ALKPHOS 90 05/09/2021   BILITOT 0.7 05/09/2021   Lab Results  Component Value Date   FERRITIN 2,108 (H) 05/09/2021   Lab Results  Component Value Date   VITAMINB12 934 (H) 05/09/2021   Lab Results  Component Value Date   FOLATE 79.8 05/09/2021     Wt Readings from Last 3 Encounters:  05/14/21 122 lb 4 oz (55.5 kg)  05/09/21 120 lb 3.2 oz (54.5 kg)  04/25/21 128 lb 4 oz (58.2 kg)   Last colonoscopy 2014 was negative.  EGD was back in 2003 remote.  He had a small hiatal hernia and esophageal ring/stricture that I dilated.  He takes Plavix chronically for coronary artery disease.  Takes Nexium daily.  Also had some left upper quadrant pain that is gone.   CURRENT LUNG CANCER THERAPY: Systemic chemotherapy with carboplatin for AUC of 5, Alimta 500 mg/M2 and Keytruda 200 mg IV every 3 weeks.  First dose November 06, 2020.  Status post 9 cycles.  Starting from cycle #5 the patient is treated with maintenance Alimta and Keytruda every 3 weeks  Allergies  Allergen Reactions   Compazine [Prochlorperazine] Other (See Comments)    " made him high and could not sleep". He does not want to take it again.   Current Meds  Medication Sig   amLODipine (NORVASC) 5 MG tablet Take 1 tablet (5 mg total) by mouth daily.   aspirin EC 81 MG tablet Take 81 mg by mouth daily.   cetirizine (ZYRTEC) 10 MG  tablet Take 10 mg by mouth at bedtime as needed for allergies.   clopidogrel (PLAVIX) 75 MG tablet TAKE 1 TABLET BY MOUTH EVERY DAY   esomeprazole (NEXIUM) 20 MG capsule Take 40 mg by mouth daily.   folic acid (FOLVITE) 1 MG tablet TAKE 1 TABLET(1 MG) BY MOUTH DAILY   lidocaine-prilocaine (EMLA) cream Apply to the Port-A-Cath site 30-60 minutes before treatment.   Magnesium 500 MG TABS Take 1 tablet (500 mg total) by mouth 3 (three) times daily.   metoprolol succinate (TOPROL XL) 25 MG 24 hr tablet Take 1 tablet (25 mg total) by mouth daily.   Multiple Vitamin (MULTIVITAMIN WITH MINERALS) TABS tablet Take 1 tablet by mouth daily.   nitroGLYCERIN (  NITROSTAT) 0.4 MG SL tablet PLACE 1 TABLET UNDER THE TONGUE EVERY 5 MINUTES AS NEEDED FOR CHEST PAIN.   ondansetron (ZOFRAN) 8 MG tablet Take 1 tablet (8 mg total) by mouth every 8 (eight) hours as needed for nausea or vomiting.   umeclidinium-vilanterol (ANORO ELLIPTA) 62.5-25 MCG/INH AEPB Inhale 1 puff into the lungs daily.   Past Medical History:  Diagnosis Date   Anginal pain ([place])    CAD (coronary artery disease)    CAP (community acquired pneumonia) 09/2016   COPD (chronic obstructive pulmonary disease) (HCC)    GERD (gastroesophageal reflux disease)    Heart murmur    "I was told I had one when I was a kid"   Hemorrhoids    History of anal fissures    "no surgeries" (10/30/2016)   Hyperlipidemia    Hypertension    lung ca dx'd 08/2018   Peripheral arterial disease ([place])    status post right common iliac artery stenting back in 2007   Seasonal allergies    Tobacco abuse    Past Surgical History:  Procedure Laterality Date   ANKLE SURGERY Left    "rebuilt it"   ANTERIOR CRUCIATE LIGAMENT REPAIR Right    CARDIAC CATHETERIZATION  09/23/2008   Continued medical therapy - may need GI evaluation in addition.   CARDIAC CATHETERIZATION  10/28/2007   Medical therapy recommended.   CARDIAC CATHETERIZATION  11/18/2006   In-stent restenosis RCA   (50% distal edge, 80% segmental mid, and 50-60% segmental proximal). Successful cutting balloon atherectomy using a [id number] cutting balloon. 3 inflations with atherectomy performed on mid and proximal portions resulting in reduction of 80% mid in-stent restenosis to less than 20% residual and 50-60% segmental proximal to less than 20% residual without dissection.   CARDIAC CATHETERIZATION  02/26/2006   Severe stenosis in RCA. Stenting performed using IVUS. 3.5x20 Maverick balloon deployed at [employer]. Distal stent-a 4x28 Liberte stent-deployed 12atm 48sec, 12atm 31sec, 4atm 19sec. Mid stent-a 4x28 Liberte stent-deployed 14atm 45sec, 14atm 60sec, 14atm 44sec. Proximal stent-4x8 Liberte- 14atm 45sec,14atm 47sec, 16atm 43sec. Severely diseased segment then appeared TIMI-3 flow.   CARDIOVASCULAR STRESS TEST  11/17/2012   No significant ECG changes. Septal perfusion defect is new when complared to study from 2010. Abnormal myocardial perfusion imaging with a basal to mid perfusion suggestive of previous MI.   CAROTID DOPPLER  08/09/2011   Bilateral Bulb/Proximal ICA - demonstrated a mild amount of fibrous plaque without evidence of significant diameter reduction reduction or other vascular abnormality.   CHEST TUBE INSERTION Right 09/02/2018   Procedure: Chest Tube Insertion;  Surgeon: Garner Nash, DO;  Location: [facility];  Service: Thoracic;  Laterality: Right;   COLONOSCOPY     2003, 2014   CORONARY ANGIOPLASTY WITH STENT PLACEMENT     FEMORAL ARTERY STENT     INGUINAL HERNIA REPAIR Right    IR IMAGING GUIDED PORT INSERTION  11/13/2020   KNEE ARTHROSCOPY Right "multiple"   LAPAROSCOPIC APPENDECTOMY N/A 12/07/2018   Procedure: APPENDECTOMY LAPAROSCOPIC;  Surgeon: Coralie Keens, MD;  Location: [facility];  Service: General;  Laterality: N/A;   LOWER EXTREMITY ARTERIAL DOPPLER  01/31/2011   Bilateral ABIs-normal values with no suggestion of arterial insuff to the lower extremities at rest. Right CIA stent-mild  amount of nonhemodynamically significant plaque is noted throughout   [place]   "bone-eating tumor"   PERCUTANEOUS STENT INTERVENTION  04/04/2006 & 04/13/2015   a. Right common iliac artery with an 8.0x18 mm Herculink  stent deployed at 12 atm. Stenosis was reduced from 80% to 0% with brisk flow. b. I-cast stenting to left common iliac artery   PERIPHERAL VASCULAR CATHETERIZATION N/A 04/13/2015   Procedure: Lower Extremity Angiography;  Surgeon: Lorretta Harp, MD; L-oCIA 75%, 40-50% L-EIA, R-CIA stent patent, s/p 8 mm x 38 mm ICast covered stent>>0% stenosis in [place] Right    TRANSTHORACIC ECHOCARDIOGRAM  11/26/2012   EF not noted. Aortic valve-sclerosis without stenosis, no regurgiation.    UPPER GASTROINTESTINAL ENDOSCOPY     US CAROTID DOPPLER BILATERAL ([place] [facility])  08/09/2011   Bilateral Bulb/Proximal ICAa demonstrated a mild amount of fibrous plaque without evidence of significant diameter reduction or any other vascular abnormality.   VIDEO ASSISTED THORACOSCOPY (VATS)/ LOBECTOMY Right 09/14/2018   Procedure: RIGHT VIDEO ASSISTED THORACOSCOPY (VATS)/ RIGHT UPPER LOBECTOMY;  Surgeon: Melrose Nakayama, MD;  Location: [facility];  Service: Thoracic;  Laterality: Right;   VIDEO BRONCHOSCOPY WITH ENDOBRONCHIAL NAVIGATION N/A 09/02/2018   Procedure: VIDEO BRONCHOSCOPY WITH ENDOBRONCHIAL NAVIGATION;  Surgeon: Garner Nash, DO;  Location: [facility];  Service: Thoracic;  Laterality: N/A;   Social History   Social History Narrative   Patient is married with 2 children   Former smoker no tobacco now no drug use occasional/social alcohol   family history includes Colon cancer in his mother; Heart disease in his father and paternal grandfather.   Review of Systems See HPI somewhat weak improved.  Otherwise negative.  Objective:   Physical Exam BP 112/60 (BP Location: Left Arm, Patient Position: Sitting, Cuff Size: Normal)   Pulse 72    Ht _0  (1.676 m) Comment: height measured without shoes  Wt 122 lb 4 oz (55.5 kg)   BMI 19.73 kg/m  Asthenic chronically ill white man in no acute distress The eyes are anicteric The lungs are clear to auscultation The abdomen is soft nontender without organomegaly or mass.  There are cutaneous vascular changes consistent with increased vascular pattern on the abdominal wall question heating pad use He is alert oriented x3 Appropriate mood and affect Skin turgor appears acceptable

## 2021-05-14 NOTE — Patient Instructions (Signed)
If you are age 65 or older, your body mass index should be between 23-30. Your Body mass index is 19.73 kg/m. If this is out of the aforementioned range listed, please consider follow up with your Primary Care Provider. ________________________________________________________ The  GI providers would like to encourage you to use [facility] to communicate with providers for non-urgent requests or questions.  Due to long hold times on the telephone, sending your provider a message by [facility] may be a faster and more efficient way to get a response.  Please allow 48 business hours for a response.  Please remember that this is for non-urgent requests.   We are going to wait on your CT scan results from today to decide on a plan and we will be in touch.  I appreciate the opportunity to care for you. Silvano Rusk, MD, [facility]

## 2021-05-16 ENCOUNTER — Inpatient Hospital Stay: Payer: BC Managed Care – PPO

## 2021-05-16 ENCOUNTER — Other Ambulatory Visit: Payer: Self-pay

## 2021-05-16 ENCOUNTER — Telehealth: Payer: Self-pay

## 2021-05-16 ENCOUNTER — Inpatient Hospital Stay (HOSPITAL_BASED_OUTPATIENT_CLINIC_OR_DEPARTMENT_OTHER): Payer: BC Managed Care – PPO | Admitting: Internal Medicine

## 2021-05-16 ENCOUNTER — Encounter: Payer: Self-pay | Admitting: *Deleted

## 2021-05-16 VITALS — BP 126/86 | HR 77 | Temp 97.1°F | Resp 19 | Ht 66.0 in | Wt 122.2 lb

## 2021-05-16 DIAGNOSIS — Z902 Acquired absence of lung [part of]: Secondary | ICD-10-CM | POA: Diagnosis not present

## 2021-05-16 DIAGNOSIS — C7951 Secondary malignant neoplasm of bone: Secondary | ICD-10-CM | POA: Diagnosis not present

## 2021-05-16 DIAGNOSIS — Z79899 Other long term (current) drug therapy: Secondary | ICD-10-CM | POA: Diagnosis not present

## 2021-05-16 DIAGNOSIS — Z95828 Presence of other vascular implants and grafts: Secondary | ICD-10-CM

## 2021-05-16 DIAGNOSIS — R112 Nausea with vomiting, unspecified: Secondary | ICD-10-CM | POA: Diagnosis not present

## 2021-05-16 DIAGNOSIS — D649 Anemia, unspecified: Secondary | ICD-10-CM | POA: Diagnosis not present

## 2021-05-16 DIAGNOSIS — Z5111 Encounter for antineoplastic chemotherapy: Secondary | ICD-10-CM | POA: Diagnosis not present

## 2021-05-16 DIAGNOSIS — Z5112 Encounter for antineoplastic immunotherapy: Secondary | ICD-10-CM | POA: Diagnosis not present

## 2021-05-16 DIAGNOSIS — C3411 Malignant neoplasm of upper lobe, right bronchus or lung: Secondary | ICD-10-CM | POA: Diagnosis not present

## 2021-05-16 DIAGNOSIS — E860 Dehydration: Secondary | ICD-10-CM | POA: Diagnosis not present

## 2021-05-16 DIAGNOSIS — C3491 Malignant neoplasm of unspecified part of right bronchus or lung: Secondary | ICD-10-CM

## 2021-05-16 DIAGNOSIS — C778 Secondary and unspecified malignant neoplasm of lymph nodes of multiple regions: Secondary | ICD-10-CM | POA: Diagnosis not present

## 2021-05-16 LAB — CBC WITH DIFFERENTIAL (CANCER CENTER ONLY)
Abs Immature Granulocytes: 0.03 10*3/uL (ref 0.00–0.07)
Basophils Absolute: 0 10*3/uL (ref 0.0–0.1)
Basophils Relative: 0 %
Eosinophils Absolute: 0.2 10*3/uL (ref 0.0–0.5)
Eosinophils Relative: 2 %
HCT: 22.3 % — ABNORMAL LOW (ref 39.0–52.0)
Hemoglobin: 8.3 g/dL — ABNORMAL LOW (ref 13.0–17.0)
Immature Granulocytes: 0 %
Lymphocytes Relative: 27 %
Lymphs Abs: 1.8 10*3/uL (ref 0.7–4.0)
MCH: 37.1 pg — ABNORMAL HIGH (ref 26.0–34.0)
MCHC: 37.2 g/dL — ABNORMAL HIGH (ref 30.0–36.0)
MCV: 99.6 fL (ref 80.0–100.0)
Monocytes Absolute: 1.2 10*3/uL — ABNORMAL HIGH (ref 0.1–1.0)
Monocytes Relative: 18 %
Neutro Abs: 3.5 10*3/uL (ref 1.7–7.7)
Neutrophils Relative %: 53 %
Platelet Count: 403 10*3/uL — ABNORMAL HIGH (ref 150–400)
RBC: 2.24 MIL/uL — ABNORMAL LOW (ref 4.22–5.81)
RDW: 17 % — ABNORMAL HIGH (ref 11.5–15.5)
WBC Count: 6.7 10*3/uL (ref 4.0–10.5)
nRBC: 0 % (ref 0.0–0.2)

## 2021-05-16 LAB — CMP (CANCER CENTER ONLY)
ALT: 27 U/L (ref 0–44)
AST: 38 U/L (ref 15–41)
Albumin: 3 g/dL — ABNORMAL LOW (ref 3.5–5.0)
Alkaline Phosphatase: 89 U/L (ref 38–126)
Anion gap: 12 (ref 5–15)
BUN: 10 mg/dL (ref 8–23)
CO2: 26 mmol/L (ref 22–32)
Calcium: 9 mg/dL (ref 8.9–10.3)
Chloride: 89 mmol/L — ABNORMAL LOW (ref 98–111)
Creatinine: 1.25 mg/dL — ABNORMAL HIGH (ref 0.61–1.24)
GFR, Estimated: >60 mL/min (ref >60)
Glucose, Bld: 100 mg/dL — ABNORMAL HIGH (ref 70–99)
Potassium: 3.1 mmol/L — ABNORMAL LOW (ref 3.5–5.1)
Sodium: 127 mmol/L — ABNORMAL LOW (ref 135–145)
Total Bilirubin: 0.4 mg/dL (ref 0.3–1.2)
Total Protein: 6.9 g/dL (ref 6.5–8.1)

## 2021-05-16 LAB — TSH: TSH: 1.272 u[IU]/mL (ref 0.320–4.118)

## 2021-05-16 MED ORDER — HEPARIN SOD (PORK) LOCK FLUSH 100 UNIT/ML IV SOLN
500.0000 [IU] | Freq: Once | INTRAVENOUS | Status: AC
  Administered 2021-05-16: 500 [IU]

## 2021-05-16 MED ORDER — SODIUM CHLORIDE 0.9% FLUSH
10.0000 mL | Freq: Once | INTRAVENOUS | Status: AC
  Administered 2021-05-16: 10 mL

## 2021-05-16 NOTE — Telephone Encounter (Signed)
Pt was scheduled an EGD on 05/22/2021 @ 10:00 in the [facility]. Pt to arrive by 9:00. Pt notified that he does not need to stop plavix per Dr. Carlean Purl. A My Chart message was sent to pt with EGD instructions.  Pt verbalized understanding with all questions answered.

## 2021-05-16 NOTE — Telephone Encounter (Signed)
-----   Message from Gatha Mayer, MD sent at 05/16/2021  8:40 AM EDT ----- Regarding: appointment for EGD Tell patient stomach wall very thick on CT as I thought He needs an EGD Does not have to stop Plavix  Please schedule next week - I have a 1000 on Tuesday that we can use

## 2021-05-16 NOTE — Progress Notes (Signed)
I was able to see Sean Rose and his wife today during clinic.  He is feeling better today.  It was great seeing him feeling better today.  He will not received treatment today but he will resume next week per Dr. [name]. He verbalized understanding.

## 2021-05-16 NOTE — Progress Notes (Signed)
[facility] Telephone:(336) [phone]   Fax:(336) [phone]  OFFICE PROGRESS NOTE  [name], NP [place] [address]  DIAGNOSIS: Metastatic non-small cell lung cancer initially diagnosed as stage IIIA (T3, N1, M0) non-small cell lung cancer, adenocarcinoma with no actionable mutations presented with multiple pulmonary nodules in the right upper lobe as well as metastatic disease in intraparenchymal lymphadenopathy.  This was diagnosed in February 2020.  The patient has disease recurrence in March 2022. PD-L1 expression 20%. He has no actionable mutations by foundation 1.  PRIOR THERAPY: 1) Status post right upper lobectomy with lymph node dissection. 2) Adjuvant systemic chemotherapy with cisplatin 75 mg/M2 and Alimta 500 mg/M2 every 3 weeks.  Status post 4 cycles. 3) palliative radiotherapy to the metastatic bone disease and the scapula as well as the left supraclavicular area under the care of Dr. Isidore Moos  CURRENT THERAPY: Systemic chemotherapy with carboplatin for AUC of 5, Alimta 500 mg/M2 and Keytruda 200 mg IV every 3 weeks.  First dose November 06, 2020.  Status post 9 cycles.  Starting from cycle #5 the patient is treated with maintenance Alimta and Keytruda every 3 weeks  INTERVAL HISTORY: Sean Rose 65 y.o. male returns to the clinic today for follow-up visit accompanied by his wife.  The patient is feeling much better today with no significant nausea or vomiting.  He is eating good and gained few pounds.  He denied having any current chest pain, shortness of breath, cough or hemoptysis.  He denied having any fever or chills.  He has no nausea, vomiting, diarrhea or constipation.  He has no abdominal pain.  He was seen by Dr. Carlean Purl and he was waiting on the scan results before making a decision regarding upper endoscopy.  The patient is here today for evaluation and discussion of his scan results.  He does not feel well enough to proceed  with chemotherapy today and would like to delay it.   MEDICAL HISTORY: Past Medical History:  Diagnosis Date   Anginal pain ([place])    CAD (coronary artery disease)    CAP (community acquired pneumonia) 09/2016   COPD (chronic obstructive pulmonary disease) (H[facility])    GERD (gastroesophageal reflux disease)    Heart murmur    "I was told I had one when I was a kid"   Hemorrhoids    History of anal fissures    "no surgeries" (10/30/2016)   Hyperlipidemia    Hypertension    lung ca dx'd 08/2018   Peripheral arterial disease ([place])    status post right common iliac artery stenting back in 2007   Seasonal allergies    Tobacco abuse     ALLERGIES:  is allergic to compazine [prochlorperazine].  MEDICATIONS:  Current Outpatient Medications  Medication Sig Dispense Refill   amLODipine (NORVASC) 5 MG tablet Take 1 tablet (5 mg total) by mouth daily. 180 tablet 3   aspirin EC 81 MG tablet Take 81 mg by mouth daily.     cetirizine (ZYRTEC) 10 MG tablet Take 10 mg by mouth at bedtime as needed for allergies.     clopidogrel (PLAVIX) 75 MG tablet TAKE 1 TABLET BY MOUTH EVERY DAY 90 tablet 3   esomeprazole (NEXIUM) 20 MG capsule Take 40 mg by mouth daily.     folic acid (FOLVITE) 1 MG tablet TAKE 1 TABLET(1 MG) BY MOUTH DAILY 30 tablet 4   lidocaine-prilocaine (EMLA) cream Apply to the Port-A-Cath site 30-60  minutes before treatment. 30 g 0   Magnesium 500 MG TABS Take 1 tablet (500 mg total) by mouth 3 (three) times daily. 8 tablet 0   metoprolol succinate (TOPROL XL) 25 MG 24 hr tablet Take 1 tablet (25 mg total) by mouth daily. 90 tablet 3   Multiple Vitamin (MULTIVITAMIN WITH MINERALS) TABS tablet Take 1 tablet by mouth daily.     nitroGLYCERIN (NITROSTAT) 0.4 MG SL tablet PLACE 1 TABLET UNDER THE TONGUE EVERY 5 MINUTES AS NEEDED FOR CHEST PAIN. 25 tablet 6   ondansetron (ZOFRAN) 8 MG tablet Take 1 tablet (8 mg total) by mouth every 8 (eight) hours as needed for nausea or vomiting. 20 tablet 0    umeclidinium-vilanterol (ANORO ELLIPTA) 62.5-25 MCG/INH AEPB Inhale 1 puff into the lungs daily. 60 each 5   rosuvastatin (CRESTOR) 5 MG tablet Take 1 tablet (5 mg total) by mouth every other day. 45 tablet 3   No current facility-administered medications for this visit.    SURGICAL HISTORY:  Past Surgical History:  Procedure Laterality Date   ANKLE SURGERY Left    "rebuilt it"   ANTERIOR CRUCIATE LIGAMENT REPAIR Right    CARDIAC CATHETERIZATION  09/23/2008   Continued medical therapy - may need GI evaluation in addition.   CARDIAC CATHETERIZATION  10/28/2007   Medical therapy recommended.   CARDIAC CATHETERIZATION  11/18/2006   In-stent restenosis RCA  (50% distal edge, 80% segmental mid, and 50-60% segmental proximal). Successful cutting balloon atherectomy using a [id number] cutting balloon. 3 inflations with atherectomy performed on mid and proximal portions resulting in reduction of 80% mid in-stent restenosis to less than 20% residual and 50-60% segmental proximal to less than 20% residual without dissection.   CARDIAC CATHETERIZATION  02/26/2006   Severe stenosis in RCA. Stenting performed using IVUS. 3.5x20 Maverick balloon deployed at [employer]. Distal stent-a 4x28 Liberte stent-deployed 12atm 48sec, 12atm 31sec, 4atm 19sec. Mid stent-a 4x28 Liberte stent-deployed 14atm 45sec, 14atm 60sec, 14atm 44sec. Proximal stent-4x8 Liberte- 14atm 45sec,14atm 47sec, 16atm 43sec. Severely diseased segment then appeared TIMI-3 flow.   CARDIOVASCULAR STRESS TEST  11/17/2012   No significant ECG changes. Septal perfusion defect is new when complared to study from 2010. Abnormal myocardial perfusion imaging with a basal to mid perfusion suggestive of previous MI.   CAROTID DOPPLER  08/09/2011   Bilateral Bulb/Proximal ICA - demonstrated a mild amount of fibrous plaque without evidence of significant diameter reduction reduction or other vascular abnormality.   CHEST TUBE INSERTION Right 09/02/2018    Procedure: Chest Tube Insertion;  Surgeon: Garner Nash, DO;  Location: [facility];  Service: Thoracic;  Laterality: Right;   COLONOSCOPY     2003, 2014   CORONARY ANGIOPLASTY WITH STENT PLACEMENT     FEMORAL ARTERY STENT     INGUINAL HERNIA REPAIR Right    IR IMAGING GUIDED PORT INSERTION  11/13/2020   KNEE ARTHROSCOPY Right "multiple"   LAPAROSCOPIC APPENDECTOMY N/A 12/07/2018   Procedure: APPENDECTOMY LAPAROSCOPIC;  Surgeon: Coralie Keens, MD;  Location: [facility];  Service: General;  Laterality: N/A;   LOWER EXTREMITY ARTERIAL DOPPLER  01/31/2011   Bilateral ABIs-normal values with no suggestion of arterial insuff to the lower extremities at rest. Right CIA stent-mild amount of nonhemodynamically significant plaque is noted throughout   [place]   "bone-eating tumor"   PERCUTANEOUS STENT INTERVENTION  04/04/2006 & 04/13/2015   a. Right common iliac artery with an 8.0x18 mm Herculink stent deployed at 12 atm. Stenosis was reduced  from 80% to 0% with brisk flow. b. I-cast stenting to left common iliac artery   PERIPHERAL VASCULAR CATHETERIZATION N/A 04/13/2015   Procedure: Lower Extremity Angiography;  Surgeon: Lorretta Harp, MD; L-oCIA 75%, 40-50% L-EIA, R-CIA stent patent, s/p 8 mm x 38 mm ICast covered stent>>0% stenosis in [place] Right    TRANSTHORACIC ECHOCARDIOGRAM  11/26/2012   EF not noted. Aortic valve-sclerosis without stenosis, no regurgiation.    UPPER GASTROINTESTINAL ENDOSCOPY     US CAROTID DOPPLER BILATERAL ([facility])  08/09/2011   Bilateral Bulb/Proximal ICAa demonstrated a mild amount of fibrous plaque without evidence of significant diameter reduction or any other vascular abnormality.   VIDEO ASSISTED THORACOSCOPY (VATS)/ LOBECTOMY Right 09/14/2018   Procedure: RIGHT VIDEO ASSISTED THORACOSCOPY (VATS)/ RIGHT UPPER LOBECTOMY;  Surgeon: Melrose Nakayama, MD;  Location: [facility];  Service: Thoracic;  Laterality:  Right;   VIDEO BRONCHOSCOPY WITH ENDOBRONCHIAL NAVIGATION N/A 09/02/2018   Procedure: VIDEO BRONCHOSCOPY WITH ENDOBRONCHIAL NAVIGATION;  Surgeon: Garner Nash, DO;  Location: [facility];  Service: Thoracic;  Laterality: N/A;    REVIEW OF SYSTEMS:  Constitutional: positive for fatigue Eyes: negative Ears, nose, mouth, throat, and face: negative Respiratory: negative Cardiovascular: negative Gastrointestinal: positive for nausea Genitourinary:negative Integument/breast: negative Hematologic/lymphatic: negative Musculoskeletal:negative Neurological: negative Behavioral/Psych: negative Endocrine: negative Allergic/Immunologic: negative   PHYSICAL EXAMINATION: General appearance: alert, cooperative, fatigued, and no distress Head: Normocephalic, without obvious abnormality, atraumatic Neck: no adenopathy, no JVD, supple, symmetrical, trachea midline, and thyroid not enlarged, symmetric, no tenderness/mass/nodules Lymph nodes: Cervical, supraclavicular, and axillary nodes normal. Resp: clear to auscultation bilaterally Back: symmetric, no curvature. ROM normal. No CVA tenderness. Cardio: regular rate and rhythm, S1, S2 normal, no murmur, click, rub or gallop GI: soft, non-tender; bowel sounds normal; no masses,  no organomegaly Extremities: extremities normal, atraumatic, no cyanosis or edema Neurologic: Alert and oriented X 3, normal strength and tone. Normal symmetric reflexes. Normal coordination and gait  ECOG PERFORMANCE STATUS: 1 - Symptomatic but completely ambulatory  Blood pressure 126/86, pulse 77, temperature (!) 97.1 F (36.2 C), temperature source Tympanic, resp. rate 19, height 5' 6" (1.676 m), weight 122 lb 3.2 oz (55.4 kg), SpO2 100 %.  LABORATORY DATA: Lab Results  Component Value Date   WBC 6.7 05/16/2021   HGB 8.3 (L) 05/16/2021   HCT 22.3 (L) 05/16/2021   MCV 99.6 05/16/2021   PLT 403 (H) 05/16/2021      Chemistry      Component Value Date/Time   NA 130 (L)  05/09/2021 1024   NA 130 (L) 03/05/2017 1110   K 3.1 (L) 05/09/2021 1024   CL 86 (L) 05/09/2021 1024   CO2 33 (H) 05/09/2021 1024   BUN 12 05/09/2021 1024   BUN 4 (L) 03/05/2017 1110   CREATININE 1.66 (H) 05/09/2021 1024   CREATININE 0.76 04/28/2020 0826      Component Value Date/Time   CALCIUM 9.2 05/09/2021 1024   ALKPHOS 90 05/09/2021 1024   AST 86 (H) 05/09/2021 1024   ALT 66 (H) 05/09/2021 1024   BILITOT 0.7 05/09/2021 1024       RADIOGRAPHIC STUDIES: CT Chest W Contrast  Result Date: 05/15/2021 CLINICAL DATA:  Non-small cell lung cancer restaging EXAM: CT CHEST, ABDOMEN, AND PELVIS WITH CONTRAST TECHNIQUE: Multidetector CT imaging of the chest, abdomen and pelvis was performed following the standard protocol during bolus administration of intravenous contrast. CONTRAST:  62m OMNIPAQUE IOHEXOL 350 MG/ML SOLN  COMPARISON:  03/01/2021 FINDINGS: CT CHEST FINDINGS Cardiovascular: Right Port-A-Cath tip: Lower SVC. Fairly narrow SVC, significance uncertain. Coronary, aortic arch, and branch vessel atherosclerotic vascular disease. Mitral valve calcification again noted. Mediastinum/Nodes: No pathologic adenopathy observed. Lungs/Pleura: Stable right apical pleuroparenchymal scarring. Reduced although not completely resolved opacity in the apical segment left upper lobe as on image 24 series 4. Stable postoperative findings of right upper lobectomy along with potential wedge resections. Bilateral chronic interstitial accentuation and emphysema. Accentuated triangular density along the upper margin of the left major fissure for example on image 45 series 4, potentially from atelectasis or fluid in the fissure, increased from prior. There is some increase rounded airspace opacity along the inferior pulmonary ligament and diaphragm measuring about 4.1 by 1.9 by 0.7 cm on image 119 series 4, possibilities may include pleural fluid, atelectasis or pneumonia, or possibly tumor. Trace pleural fluid  posterior to the thoracic aorta for example on image 32 series 2. Musculoskeletal: Lytic and sclerotic lesion of the left scapula including the left glenoid roughly similar to prior. Please note that the entirety of the scapula was not included. CT ABDOMEN PELVIS FINDINGS Hepatobiliary: Unremarkable Pancreas: Unremarkable aside for a punctate calcification in the pancreatic head which is considered benign. Spleen: Unremarkable Adrenals/Urinary Tract: Minimally thickened adrenal glands without adrenal mass. Vascular calcification in the right renal hilum. Calcification posteriorly along the left renal parenchyma on image 21 series 7 common no change from 09/25/2020. Stomach/Bowel: Diffuse wall thickening in the gastric fundus and body was not previously hypermetabolic, and is probably mainly from nondistention although a component gastritis is not excluded. Vascular/Lymphatic: Prominent atherosclerosis is present, including aortoiliac atherosclerotic disease. No pathologic adenopathy noted. Retroaortic left renal vein. Common iliac artery stents. Reproductive: Unremarkable Other: No supplemental non-categorized findings. Musculoskeletal: Unremarkable IMPRESSION: 1. Stable appearance of the mixed density lesion of the left scapula compatible with prior osseous metastatic disease. 2. Reduced airspace opacity at the left lung apex compared to the prior exam, some of this may be related to prior radiation therapy. 3. Small left pleural effusion, with density along the top of the left major fissure and along the left hemidiaphragm probably related to pleural fluid or atelectasis, with malignant involvement being substantially less likely. Attention on surveillance imaging suggested. 4. Other imaging findings of potential clinical significance: Aortic Atherosclerosis (ICD10-I70.0). Coronary atherosclerosis. Emphysema (ICD10-J43.9). Right upper lobectomy. Prominent atherosclerosis. Electronically Signed   By: Van Clines M.D.   On: 05/15/2021 12:30   CT Abdomen Pelvis W Contrast  Result Date: 05/15/2021 CLINICAL DATA:  Non-small cell lung cancer restaging EXAM: CT CHEST, ABDOMEN, AND PELVIS WITH CONTRAST TECHNIQUE: Multidetector CT imaging of the chest, abdomen and pelvis was performed following the standard protocol during bolus administration of intravenous contrast. CONTRAST:  54m OMNIPAQUE IOHEXOL 350 MG/ML SOLN COMPARISON:  03/01/2021 FINDINGS: CT CHEST FINDINGS Cardiovascular: Right Port-A-Cath tip: Lower SVC. Fairly narrow SVC, significance uncertain. Coronary, aortic arch, and branch vessel atherosclerotic vascular disease. Mitral valve calcification again noted. Mediastinum/Nodes: No pathologic adenopathy observed. Lungs/Pleura: Stable right apical pleuroparenchymal scarring. Reduced although not completely resolved opacity in the apical segment left upper lobe as on image 24 series 4. Stable postoperative findings of right upper lobectomy along with potential wedge resections. Bilateral chronic interstitial accentuation and emphysema. Accentuated triangular density along the upper margin of the left major fissure for example on image 45 series 4, potentially from atelectasis or fluid in the fissure, increased from prior. There is some increase rounded airspace opacity along the inferior pulmonary  ligament and diaphragm measuring about 4.1 by 1.9 by 0.7 cm on image 119 series 4, possibilities may include pleural fluid, atelectasis or pneumonia, or possibly tumor. Trace pleural fluid posterior to the thoracic aorta for example on image 32 series 2. Musculoskeletal: Lytic and sclerotic lesion of the left scapula including the left glenoid roughly similar to prior. Please note that the entirety of the scapula was not included. CT ABDOMEN PELVIS FINDINGS Hepatobiliary: Unremarkable Pancreas: Unremarkable aside for a punctate calcification in the pancreatic head which is considered benign. Spleen: Unremarkable  Adrenals/Urinary Tract: Minimally thickened adrenal glands without adrenal mass. Vascular calcification in the right renal hilum. Calcification posteriorly along the left renal parenchyma on image 21 series 7 common no change from 09/25/2020. Stomach/Bowel: Diffuse wall thickening in the gastric fundus and body was not previously hypermetabolic, and is probably mainly from nondistention although a component gastritis is not excluded. Vascular/Lymphatic: Prominent atherosclerosis is present, including aortoiliac atherosclerotic disease. No pathologic adenopathy noted. Retroaortic left renal vein. Common iliac artery stents. Reproductive: Unremarkable Other: No supplemental non-categorized findings. Musculoskeletal: Unremarkable IMPRESSION: 1. Stable appearance of the mixed density lesion of the left scapula compatible with prior osseous metastatic disease. 2. Reduced airspace opacity at the left lung apex compared to the prior exam, some of this may be related to prior radiation therapy. 3. Small left pleural effusion, with density along the top of the left major fissure and along the left hemidiaphragm probably related to pleural fluid or atelectasis, with malignant involvement being substantially less likely. Attention on surveillance imaging suggested. 4. Other imaging findings of potential clinical significance: Aortic Atherosclerosis (ICD10-I70.0). Coronary atherosclerosis. Emphysema (ICD10-J43.9). Right upper lobectomy. Prominent atherosclerosis. Electronically Signed   By: Van Clines M.D.   On: 05/15/2021 12:30    ASSESSMENT AND PLAN: This is a very pleasant 65 years old white male with a stage IIIB non-small cell lung cancer, adenocarcinoma with no actionable mutations status post right upper lobectomy with lymph node dissection under the care of [name]. The patient completed a course of adjuvant treatment with cisplatin and Alimta status post 4 cycles.  He tolerated the previous 4 cycles  of his treatment fairly well. The patient has been in observation but the recent imaging studies showed suspicious bone lesion in the left shoulder area. I ordered a PET scan which was performed on 09/25/2020 and it showed metastatic disease involving the left scapula and left supraclavicular lymph nodes. The patient had ultrasound-guided core biopsy of the left supraclavicular lymph node and it was positive for metastatic carcinoma. He underwent palliative radiotherapy to the metastatic bone lesion in the left scapula and he is feeling much better. PD-L1 expression 20%. He was tested in the past for molecular study by foundation 1 and it was reported to be negative for actionable mutations. The patient started systemic chemotherapy with carboplatin for AUC of 5, Alimta 500 Mg/M2 and Keytruda 200 Mg IV every 3 weeks status post 9 cycles.  Starting from cycle #5 he is treated with maintenance Alimta and Keytruda every 3 weeks. He has a rough time with the last cycle of the chemotherapy with significant nausea and vomiting as well as diarrhea last week but this significantly improved after the patient received IV hydration with IV Zofran. He was seen by gastroenterology and expected to have upper endoscopy soon for further evaluation of his condition. The patient had repeat CT scan of the chest, abdomen pelvis performed recently.  I personally and independently reviewed the scan images and discussed  the results with the patient and his wife.  His scan showed no concerning findings for disease progression. I recommended for him to continue his current treatment with maintenance Alimta and Keytruda but he would like to delay cycle number 10 x 1 week. After the upper endoscopy if the patient has no clear etiology for his persistent anemia and nausea, I may drop Alimta from his treatment and keep him on maintenance therapy with single agent Keytruda up to 2 years. The patient will come back for follow-up visit  in 4 weeks for evaluation with the start of the following cycle of his treatment. He was advised to call immediately if he has any other concerning symptoms in the interval. All questions were answered. The patient knows to call the clinic with any problems, questions or concerns. We can certainly see the patient much sooner if necessary.  Disclaimer: This note was dictated with voice recognition software. Similar sounding words can inadvertently be transcribed and may not be corrected upon review.

## 2021-05-17 ENCOUNTER — Other Ambulatory Visit: Payer: Self-pay | Admitting: Internal Medicine

## 2021-05-17 ENCOUNTER — Telehealth: Payer: Self-pay | Admitting: Internal Medicine

## 2021-05-17 NOTE — Telephone Encounter (Signed)
Scheduled follow-up appointments per 10/19 los. Patient is aware.

## 2021-05-18 ENCOUNTER — Encounter: Payer: Self-pay | Admitting: Internal Medicine

## 2021-05-21 ENCOUNTER — Encounter: Payer: Self-pay | Admitting: Internal Medicine

## 2021-05-22 ENCOUNTER — Ambulatory Visit (AMBULATORY_SURGERY_CENTER): Payer: BC Managed Care – PPO | Admitting: Internal Medicine

## 2021-05-22 ENCOUNTER — Encounter: Payer: Self-pay | Admitting: Internal Medicine

## 2021-05-22 VITALS — BP 123/73 | HR 63 | Temp 97.5°F | Resp 22 | Ht 66.0 in | Wt 122.0 lb

## 2021-05-22 DIAGNOSIS — K449 Diaphragmatic hernia without obstruction or gangrene: Secondary | ICD-10-CM | POA: Diagnosis not present

## 2021-05-22 DIAGNOSIS — R112 Nausea with vomiting, unspecified: Secondary | ICD-10-CM

## 2021-05-22 DIAGNOSIS — K219 Gastro-esophageal reflux disease without esophagitis: Secondary | ICD-10-CM | POA: Diagnosis not present

## 2021-05-22 DIAGNOSIS — R933 Abnormal findings on diagnostic imaging of other parts of digestive tract: Secondary | ICD-10-CM

## 2021-05-22 DIAGNOSIS — K3189 Other diseases of stomach and duodenum: Secondary | ICD-10-CM | POA: Diagnosis not present

## 2021-05-22 DIAGNOSIS — B3781 Candidal esophagitis: Secondary | ICD-10-CM

## 2021-05-22 DIAGNOSIS — K2980 Duodenitis without bleeding: Secondary | ICD-10-CM

## 2021-05-22 MED ORDER — SODIUM CHLORIDE 0.9 % IV SOLN: 500.0000 mL | Freq: Once | INTRAVENOUS | Status: DC

## 2021-05-22 MED ORDER — FLUCONAZOLE 50 MG PO TABS: ORAL_TABLET | ORAL | 0 refills | Status: DC

## 2021-05-22 NOTE — Patient Instructions (Addendum)
I did not see any significant abnormality in the stomach lining. There was some prominence of the lining in some areas but that can be normal.  There was evidence of thrush (yeast) in the esophagus  I am treating this with a medication called fluconazole.  I took biopsies to see what that tells [place]. I am glad you are feeling better. It may be that you had a transient infection.  I appreciate the opportunity to care for you. Gatha Mayer, MD, [facility]  You may piclup your new RX, Diflucan, at your pharmacy today.  YOU HAD AN ENDOSCOPIC PROCEDURE TODAY AT [facility]:   Refer to the procedure report that was given to you for any specific questions about what was found during the examination.  If the procedure report does not answer your questions, please call your gastroenterologist to clarify.  If you requested that your care partner not be given the details of your procedure findings, then the procedure report has been included in a sealed envelope for you to review at your convenience later.  YOU SHOULD EXPECT: Some feelings of bloating in the abdomen. Passage of more gas than usual.  Walking can help get rid of the air that was put into your GI tract during the procedure and reduce the bloating. If you had a lower endoscopy (such as a colonoscopy or flexible sigmoidoscopy) you may notice spotting of blood in your stool or on the toilet paper. If you underwent a bowel prep for your procedure, you may not have a normal bowel movement for a few days.  Please Note:  You might notice some irritation and congestion in your nose or some drainage.  This is from the oxygen used during your procedure.  There is no need for concern and it should clear up in a day or so.  SYMPTOMS TO REPORT IMMEDIATELY:     Following upper endoscopy (EGD)  Vomiting of blood or coffee ground material  New chest pain or pain under the shoulder blades  Painful or persistently difficult swallowing  New  shortness of breath  Fever of 100F or higher  Black, tarry-looking stools  For urgent or emergent issues, a gastroenterologist can be reached at any hour by calling [phone]. Do not use MyChart messaging for urgent concerns.    DIET:  We do recommend a small meal at first, but then you may proceed to your regular diet.  Drink plenty of fluids but you should avoid alcoholic beverages for 24 hours.  ACTIVITY:  You should plan to take it easy for the rest of today and you should NOT DRIVE or use heavy machinery until tomorrow (because of the sedation medicines used during the test).    FOLLOW UP: Our staff will call the number listed on your records 48-72 hours following your procedure to check on you and address any questions or concerns that you may have regarding the information given to you following your procedure. If we do not reach you, we will leave a message.  We will attempt to reach you two times.  During this call, we will ask if you have developed any symptoms of COVID 19. If you develop any symptoms (ie: fever, flu-like symptoms, shortness of breath, cough etc.) before then, please call [phone].  If you test positive for Covid 19 in the 2 weeks post procedure, please call and report this information to [place].    If any biopsies were taken you will be contacted by  phone or by letter within the next 1-3 weeks.  Please call us at [phone] if you have not heard about the biopsies in 3 weeks.    SIGNATURES/CONFIDENTIALITY: You and/or your care partner have signed paperwork which will be entered into your electronic medical record.  These signatures attest to the fact that that the information above on your After Visit Summary has been reviewed and is understood.  Full responsibility of the confidentiality of this discharge information lies with you and/or your care-partner.

## 2021-05-22 NOTE — Progress Notes (Signed)
[facility] 

## 2021-05-22 NOTE — Op Note (Addendum)
[facility] Patient Name: Sean Rose Procedure Date: 05/22/2021 10:18 AM MRN: [id number] Endoscopist: Gatha Mayer , MD Age: 65 Referring MD:  Date of Birth: Oct 05, 1955 Gender: Male Account #: [id number] Procedure:                Upper GI endoscopy Indications:              Abnormal CT of the GI tract, Diarrhea, Nausea with                            vomiting Medicines:                Propofol per Anesthesia, Monitored Anesthesia Care Procedure:                Pre-Anesthesia Assessment:                           - Prior to the procedure, a History and Physical                            was performed, and patient medications and                            allergies were reviewed. The patient's tolerance of                            previous anesthesia was also reviewed. The risks                            and benefits of the procedure and the sedation                            options and risks were discussed with the patient.                            All questions were answered, and informed consent                            was obtained. Prior Anticoagulants: The patient                            last took Plavix (clopidogrel) 1 day prior to the                            procedure. ASA Grade Assessment: III - A patient                            with severe systemic disease. After reviewing the                            risks and benefits, the patient was deemed in                            satisfactory condition to undergo the procedure.  After obtaining informed consent, the endoscope was                            passed under direct vision. Throughout the                            procedure, the patient's blood pressure, pulse, and                            oxygen saturations were monitored continuously. The                            GIF [id number] #[id number] was introduced through the                            mouth, and advanced  to the second part of duodenum.                            The upper GI endoscopy was accomplished without                            difficulty. The patient tolerated the procedure                            well. Scope In: Scope Out: Findings:                 , white plaques were found in the lower third of                            the esophagus.                           A 3 cm hiatal hernia was present.                           The cardia and gastric fundus were normal on                            retroflexion.                           Diffuse mucosal changes characterized by prominent                            rugae were found in the gastric fundus and in the                            gastric body. Biopsies were taken with a cold                            forceps for histology. Verification of patient                            identification for the specimen was done. Estimated  blood loss was minimal.                           The exam was otherwise without abnormality.                           Biopsies were taken with a cold forceps in the                            gastric antrum for histology. Verification of                            patient identification for the specimen was done.                            Estimated blood loss was minimal.                           Biopsies were taken with a cold forceps in the                            entire duodenum for histology. Estimated blood loss                            was minimal. Complications:            No immediate complications. Estimated Blood Loss:     Estimated blood loss was minimal. Impression:               - Esophageal plaques were found, consistent with                            candidiasis. Tx w/ fluconazole x 2 weeks                           - 3 cm hiatal hernia.                           - Prominent rugae mucosa in the gastric fundus and                            gastric  body. Biopsied.                           - The examination was otherwise normal.                           - Biopsies were taken with a cold forceps for                            histology in the gastric antrum.                           - Biopsies were taken with a cold forceps for  histology in the entire duodenum.                           ? if he had a transient gastritis/gastroenteritis                            vs Check-point inhibitor issues Recommendation:           - Patient has a contact number available for                            emergencies. The signs and symptoms of potential                            delayed complications were discussed with the                            patient. Return to normal activities tomorrow.                            Written discharge instructions were provided to the                            patient.                           - Resume previous diet.                           - Continue present medications.                           - Resume Plavix (clopidogrel) at prior dose                            tomorrow. Gatha Mayer, MD 05/22/2021 10:49:32 AM This report has been signed electronically.

## 2021-05-22 NOTE — Progress Notes (Signed)
Called to room to assist during endoscopic procedure.  Patient ID and intended procedure confirmed with present staff. Received instructions for my participation in the procedure from the performing physician.  

## 2021-05-22 NOTE — Progress Notes (Signed)
History and Physical Interval Note:  05/22/2021 10:18 AM  Sean Rose  has presented today for endoscopic procedure(s), with the diagnosis of  Encounter Diagnosis  Name Primary?   Nausea and vomiting, unspecified vomiting type Yes  Abnormal CT scan of stomacjh also.   The various methods of evaluation and treatment have been discussed with the patient and/or family. After consideration of risks, benefits and other options for treatment, the patient has consented to  the endoscopic procedure(s).   The patient's history has been reviewed, patient examined, no change in status, stable for endoscopic procedure(s).  I have reviewed the patient's chart and labs.  Questions were answered to the patient's satisfaction.     Gatha Mayer, MD, [facility]

## 2021-05-22 NOTE — Progress Notes (Signed)
To PACU, VSS. Report to [email or web address] 

## 2021-05-23 ENCOUNTER — Other Ambulatory Visit: Payer: Self-pay

## 2021-05-23 ENCOUNTER — Inpatient Hospital Stay: Payer: BC Managed Care – PPO

## 2021-05-23 VITALS — BP 151/90 | HR 66 | Temp 98.6°F | Resp 18 | Ht 66.0 in | Wt 123.0 lb

## 2021-05-23 DIAGNOSIS — Z902 Acquired absence of lung [part of]: Secondary | ICD-10-CM | POA: Diagnosis not present

## 2021-05-23 DIAGNOSIS — Z5111 Encounter for antineoplastic chemotherapy: Secondary | ICD-10-CM | POA: Diagnosis not present

## 2021-05-23 DIAGNOSIS — C778 Secondary and unspecified malignant neoplasm of lymph nodes of multiple regions: Secondary | ICD-10-CM | POA: Diagnosis not present

## 2021-05-23 DIAGNOSIS — D539 Nutritional anemia, unspecified: Secondary | ICD-10-CM

## 2021-05-23 DIAGNOSIS — C7951 Secondary malignant neoplasm of bone: Secondary | ICD-10-CM | POA: Diagnosis not present

## 2021-05-23 DIAGNOSIS — E860 Dehydration: Secondary | ICD-10-CM | POA: Diagnosis not present

## 2021-05-23 DIAGNOSIS — D649 Anemia, unspecified: Secondary | ICD-10-CM | POA: Diagnosis not present

## 2021-05-23 DIAGNOSIS — C3491 Malignant neoplasm of unspecified part of right bronchus or lung: Secondary | ICD-10-CM

## 2021-05-23 DIAGNOSIS — Z95828 Presence of other vascular implants and grafts: Secondary | ICD-10-CM

## 2021-05-23 DIAGNOSIS — C3411 Malignant neoplasm of upper lobe, right bronchus or lung: Secondary | ICD-10-CM | POA: Diagnosis not present

## 2021-05-23 DIAGNOSIS — R112 Nausea with vomiting, unspecified: Secondary | ICD-10-CM | POA: Diagnosis not present

## 2021-05-23 DIAGNOSIS — Z79899 Other long term (current) drug therapy: Secondary | ICD-10-CM | POA: Diagnosis not present

## 2021-05-23 DIAGNOSIS — Z5112 Encounter for antineoplastic immunotherapy: Secondary | ICD-10-CM | POA: Diagnosis not present

## 2021-05-23 LAB — CBC WITH DIFFERENTIAL (CANCER CENTER ONLY)
Abs Immature Granulocytes: 0.01 10*3/uL (ref 0.00–0.07)
Basophils Absolute: 0 10*3/uL (ref 0.0–0.1)
Basophils Relative: 0 %
Eosinophils Absolute: 0.2 10*3/uL (ref 0.0–0.5)
Eosinophils Relative: 3 %
HCT: 23 % — ABNORMAL LOW (ref 39.0–52.0)
Hemoglobin: 8.2 g/dL — ABNORMAL LOW (ref 13.0–17.0)
Immature Granulocytes: 0 %
Lymphocytes Relative: 29 %
Lymphs Abs: 1.8 10*3/uL (ref 0.7–4.0)
MCH: 36.6 pg — ABNORMAL HIGH (ref 26.0–34.0)
MCHC: 35.7 g/dL (ref 30.0–36.0)
MCV: 102.7 fL — ABNORMAL HIGH (ref 80.0–100.0)
Monocytes Absolute: 1.3 10*3/uL — ABNORMAL HIGH (ref 0.1–1.0)
Monocytes Relative: 21 %
Neutro Abs: 3 10*3/uL (ref 1.7–7.7)
Neutrophils Relative %: 47 %
Platelet Count: 434 10*3/uL — ABNORMAL HIGH (ref 150–400)
RBC: 2.24 MIL/uL — ABNORMAL LOW (ref 4.22–5.81)
RDW: 16.8 % — ABNORMAL HIGH (ref 11.5–15.5)
WBC Count: 6.3 10*3/uL (ref 4.0–10.5)
nRBC: 0 % (ref 0.0–0.2)

## 2021-05-23 MED ORDER — SODIUM CHLORIDE 0.9% FLUSH
10.0000 mL | INTRAVENOUS | Status: DC | PRN
  Administered 2021-05-23: 10 mL

## 2021-05-23 MED ORDER — ONDANSETRON HCL 4 MG/2ML IJ SOLN
8.0000 mg | Freq: Once | INTRAMUSCULAR | Status: AC
  Administered 2021-05-23: 8 mg via INTRAVENOUS
  Filled 2021-05-23: qty 4

## 2021-05-23 MED ORDER — SODIUM CHLORIDE 0.9 % IV SOLN: Freq: Once | INTRAVENOUS | Status: AC

## 2021-05-23 MED ORDER — PEMETREXED DISODIUM CHEMO INJECTION 500 MG
800.0000 mg | Freq: Once | INTRAVENOUS | Status: AC
  Administered 2021-05-23: 800 mg via INTRAVENOUS
  Filled 2021-05-23: qty 20

## 2021-05-23 MED ORDER — SODIUM CHLORIDE 0.9 % IV SOLN
200.0000 mg | Freq: Once | INTRAVENOUS | Status: AC
  Administered 2021-05-23: 200 mg via INTRAVENOUS
  Filled 2021-05-23: qty 8

## 2021-05-23 MED ORDER — SODIUM CHLORIDE 0.9% FLUSH
10.0000 mL | Freq: Once | INTRAVENOUS | Status: AC
  Administered 2021-05-23: 10 mL

## 2021-05-23 MED ORDER — HEPARIN SOD (PORK) LOCK FLUSH 100 UNIT/ML IV SOLN
500.0000 [IU] | Freq: Once | INTRAVENOUS | Status: AC | PRN
  Administered 2021-05-23: 500 [IU]

## 2021-05-23 NOTE — Patient Instructions (Signed)
[facility]  Discharge Instructions: Thank you for choosing [facility] to provide your oncology and hematology care.   If you have a lab appointment with the [facility], please go directly to the [facility] and check in at the registration area.   Wear comfortable clothing and clothing appropriate for easy access to any Portacath or PICC line.   We strive to give you quality time with your provider. You may need to reschedule your appointment if you arrive late (15 or more minutes).  Arriving late affects you and other patients whose appointments are after yours.  Also, if you miss three or more appointments without notifying the office, you may be dismissed from the clinic at the provider's discretion.      For prescription refill requests, have your pharmacy contact our office and allow 72 hours for refills to be completed.    Today you received the following chemotherapy and/or immunotherapy agents: Keytruda and Alimta      To help prevent nausea and vomiting after your treatment, we encourage you to take your nausea medication as directed.  BELOW ARE SYMPTOMS THAT SHOULD BE REPORTED IMMEDIATELY: *FEVER GREATER THAN 100.4 F (38 C) OR HIGHER *CHILLS OR SWEATING *NAUSEA AND VOMITING THAT IS NOT CONTROLLED WITH YOUR NAUSEA MEDICATION *UNUSUAL SHORTNESS OF BREATH *UNUSUAL BRUISING OR BLEEDING *URINARY PROBLEMS (pain or burning when urinating, or frequent urination) *BOWEL PROBLEMS (unusual diarrhea, constipation, pain near the anus) TENDERNESS IN MOUTH AND THROAT WITH OR WITHOUT PRESENCE OF ULCERS (sore throat, sores in mouth, or a toothache) UNUSUAL RASH, SWELLING OR PAIN  UNUSUAL VAGINAL DISCHARGE OR ITCHING   Items with * indicate a potential emergency and should be followed up as soon as possible or go to the Emergency Department if any problems should occur.  Please show the CHEMOTHERAPY ALERT CARD or IMMUNOTHERAPY ALERT CARD at  check-in to the Emergency Department and triage nurse.  Should you have questions after your visit or need to cancel or reschedule your appointment, please contact [facility]  Dept: [phone]  and follow the prompts.  Office hours are 8:00 a.m. to 4:30 p.m. Monday - Friday. Please note that voicemails left after 4:00 p.m. may not be returned until the following business day.  We are closed weekends and major holidays. You have access to a nurse at all times for urgent questions. Please call the main number to the clinic Dept: [phone] and follow the prompts.   For any non-urgent questions, you may also contact your provider using MyChart. We now offer e-Visits for anyone 3 and older to request care online for non-urgent symptoms. For details visit [email or web address].   Also download the MyChart app! Go to the app store, search "MyChart", open the app, select [facility], and log in with your MyChart username and password.  Due to Covid, a mask is required upon entering the hospital/clinic. If you do not have a mask, one will be given to you upon arrival. For doctor visits, patients may have 1 support person aged 74 or older with them. For treatment visits, patients cannot have anyone with them due to current Covid guidelines and our immunocompromised population.

## 2021-05-23 NOTE — Progress Notes (Signed)
Ok to treat without CMP drawn today, using CMP drawn 10/19 per [name]

## 2021-05-23 NOTE — Progress Notes (Signed)
Decrease Alimta dose to 800mg  per MD.  Acquanetta Belling, RPH, BCPS, BCOP 05/23/2021 9:01 AM

## 2021-05-24 ENCOUNTER — Telehealth: Payer: Self-pay | Admitting: *Deleted

## 2021-05-24 NOTE — Telephone Encounter (Signed)
  Follow up Call-  Call back number 05/22/2021  Post procedure Call Back phone  # [phone]  Permission to leave phone message Yes  Some recent data might be hidden     Patient questions:  Do you have a fever, pain , or abdominal swelling? No. Pain Score  0 *  Have you tolerated food without any problems? Yes.    Have you been able to return to your normal activities? Yes.    Do you have any questions about your discharge instructions: Diet   No. Medications  No. Follow up visit  No.  Do you have questions or concerns about your Care? No.- pt states he has "a little bit of bloating" but states she had chemotherapy yesterday which usually affects his GI tract. Pt was not concerned at this time but told him to call back if it does not improve.   Actions: * If pain score is 4 or above: No action needed, pain <4.  Have you developed a fever since your procedure? no  2.   Have you had an respiratory symptoms (SOB or cough) since your procedure? no  3.   Have you tested positive for COVID 19 since your procedure no  4.   Have you had any family members/close contacts diagnosed with the COVID 19 since your procedure?  no   If yes to any of these questions please route to Joylene John, RN and Joella Prince, RN

## 2021-05-30 ENCOUNTER — Other Ambulatory Visit: Payer: Self-pay

## 2021-05-30 DIAGNOSIS — K5900 Constipation, unspecified: Secondary | ICD-10-CM

## 2021-05-30 DIAGNOSIS — R112 Nausea with vomiting, unspecified: Secondary | ICD-10-CM

## 2021-05-30 DIAGNOSIS — F5000 Anorexia nervosa, unspecified: Secondary | ICD-10-CM

## 2021-05-31 ENCOUNTER — Other Ambulatory Visit (INDEPENDENT_AMBULATORY_CARE_PROVIDER_SITE_OTHER): Payer: BC Managed Care – PPO

## 2021-05-31 ENCOUNTER — Ambulatory Visit: Payer: BC Managed Care – PPO | Admitting: Nurse Practitioner

## 2021-05-31 DIAGNOSIS — K5900 Constipation, unspecified: Secondary | ICD-10-CM

## 2021-05-31 DIAGNOSIS — R112 Nausea with vomiting, unspecified: Secondary | ICD-10-CM

## 2021-05-31 DIAGNOSIS — F5000 Anorexia nervosa, unspecified: Secondary | ICD-10-CM

## 2021-05-31 LAB — CBC WITH DIFFERENTIAL/PLATELET
Basophils Absolute: 0 10*3/uL (ref 0.0–0.1)
Basophils Relative: 0.4 % (ref 0.0–3.0)
Eosinophils Absolute: 0 10*3/uL (ref 0.0–0.7)
Eosinophils Relative: 1 % (ref 0.0–5.0)
HCT: 23.4 % — CL (ref 39.0–52.0)
Hemoglobin: 8.3 g/dL — ABNORMAL LOW (ref 13.0–17.0)
Lymphocytes Relative: 41.3 % (ref 12.0–46.0)
Lymphs Abs: 1.2 10*3/uL (ref 0.7–4.0)
MCHC: 35.5 g/dL (ref 30.0–36.0)
MCV: 106.7 fl — ABNORMAL HIGH (ref 78.0–100.0)
Monocytes Absolute: 0.1 10*3/uL (ref 0.1–1.0)
Monocytes Relative: 4.5 % (ref 3.0–12.0)
Neutro Abs: 1.6 10*3/uL (ref 1.4–7.7)
Neutrophils Relative %: 52.8 % (ref 43.0–77.0)
Platelets: 262 10*3/uL (ref 150.0–400.0)
RBC: 2.2 Mil/uL — ABNORMAL LOW (ref 4.22–5.81)
RDW: 17.4 % — ABNORMAL HIGH (ref 11.5–15.5)
WBC: 3 10*3/uL — ABNORMAL LOW (ref 4.0–10.5)

## 2021-05-31 LAB — COMPREHENSIVE METABOLIC PANEL
ALT: 67 U/L — ABNORMAL HIGH (ref 0–53)
AST: 92 U/L — ABNORMAL HIGH (ref 0–37)
Albumin: 3.5 g/dL (ref 3.5–5.2)
Alkaline Phosphatase: 104 U/L (ref 39–117)
BUN: 22 mg/dL (ref 6–23)
CO2: 28 mEq/L (ref 19–32)
Calcium: 9.4 mg/dL (ref 8.4–10.5)
Chloride: 88 mEq/L — ABNORMAL LOW (ref 96–112)
Creatinine, Ser: 1.47 mg/dL (ref 0.40–1.50)
GFR: 49.8 mL/min — ABNORMAL LOW (ref >60.00)
Glucose, Bld: 113 mg/dL — ABNORMAL HIGH (ref 70–99)
Potassium: 3.8 mEq/L (ref 3.5–5.1)
Sodium: 126 mEq/L — ABNORMAL LOW (ref 135–145)
Total Bilirubin: 0.8 mg/dL (ref 0.2–1.2)
Total Protein: 7.4 g/dL (ref 6.0–8.3)

## 2021-06-01 ENCOUNTER — Other Ambulatory Visit: Payer: Self-pay

## 2021-06-01 ENCOUNTER — Inpatient Hospital Stay: Payer: BC Managed Care – PPO | Attending: Internal Medicine

## 2021-06-01 ENCOUNTER — Telehealth: Payer: Self-pay | Admitting: Medical Oncology

## 2021-06-01 ENCOUNTER — Inpatient Hospital Stay (HOSPITAL_BASED_OUTPATIENT_CLINIC_OR_DEPARTMENT_OTHER): Payer: BC Managed Care – PPO | Admitting: Physician Assistant

## 2021-06-01 ENCOUNTER — Other Ambulatory Visit: Payer: Self-pay | Admitting: Medical Oncology

## 2021-06-01 ENCOUNTER — Other Ambulatory Visit: Payer: Self-pay | Admitting: Physician Assistant

## 2021-06-01 ENCOUNTER — Other Ambulatory Visit: Payer: Self-pay | Admitting: *Deleted

## 2021-06-01 VITALS — BP 121/76 | HR 78 | Temp 98.6°F | Resp 18 | Wt 117.0 lb

## 2021-06-01 DIAGNOSIS — C770 Secondary and unspecified malignant neoplasm of lymph nodes of head, face and neck: Secondary | ICD-10-CM | POA: Insufficient documentation

## 2021-06-01 DIAGNOSIS — D649 Anemia, unspecified: Secondary | ICD-10-CM | POA: Insufficient documentation

## 2021-06-01 DIAGNOSIS — R112 Nausea with vomiting, unspecified: Secondary | ICD-10-CM

## 2021-06-01 DIAGNOSIS — B3781 Candidal esophagitis: Secondary | ICD-10-CM | POA: Diagnosis not present

## 2021-06-01 DIAGNOSIS — C3411 Malignant neoplasm of upper lobe, right bronchus or lung: Secondary | ICD-10-CM | POA: Diagnosis not present

## 2021-06-01 DIAGNOSIS — R110 Nausea: Secondary | ICD-10-CM | POA: Diagnosis not present

## 2021-06-01 DIAGNOSIS — C3491 Malignant neoplasm of unspecified part of right bronchus or lung: Secondary | ICD-10-CM

## 2021-06-01 DIAGNOSIS — R531 Weakness: Secondary | ICD-10-CM | POA: Insufficient documentation

## 2021-06-01 DIAGNOSIS — D539 Nutritional anemia, unspecified: Secondary | ICD-10-CM

## 2021-06-01 DIAGNOSIS — Z95828 Presence of other vascular implants and grafts: Secondary | ICD-10-CM

## 2021-06-01 DIAGNOSIS — E871 Hypo-osmolality and hyponatremia: Secondary | ICD-10-CM

## 2021-06-01 DIAGNOSIS — K1379 Other lesions of oral mucosa: Secondary | ICD-10-CM | POA: Insufficient documentation

## 2021-06-01 DIAGNOSIS — C7951 Secondary malignant neoplasm of bone: Secondary | ICD-10-CM | POA: Insufficient documentation

## 2021-06-01 DIAGNOSIS — Z5111 Encounter for antineoplastic chemotherapy: Secondary | ICD-10-CM | POA: Insufficient documentation

## 2021-06-01 LAB — CBC WITH DIFFERENTIAL (CANCER CENTER ONLY)
Abs Immature Granulocytes: 0.02 10*3/uL (ref 0.00–0.07)
Basophils Absolute: 0 10*3/uL (ref 0.0–0.1)
Basophils Relative: 0 %
Eosinophils Absolute: 0.1 10*3/uL (ref 0.0–0.5)
Eosinophils Relative: 2 %
HCT: 19.6 % — ABNORMAL LOW (ref 39.0–52.0)
Hemoglobin: 6.9 g/dL — CL (ref 13.0–17.0)
Immature Granulocytes: 1 %
Lymphocytes Relative: 39 %
Lymphs Abs: 1.1 10*3/uL (ref 0.7–4.0)
MCH: 37.1 pg — ABNORMAL HIGH (ref 26.0–34.0)
MCHC: 35.2 g/dL (ref 30.0–36.0)
MCV: 105.4 fL — ABNORMAL HIGH (ref 80.0–100.0)
Monocytes Absolute: 0.1 10*3/uL (ref 0.1–1.0)
Monocytes Relative: 4 %
Neutro Abs: 1.5 10*3/uL — ABNORMAL LOW (ref 1.7–7.7)
Neutrophils Relative %: 54 %
Platelet Count: 169 10*3/uL (ref 150–400)
RBC: 1.86 MIL/uL — ABNORMAL LOW (ref 4.22–5.81)
RDW: 15.9 % — ABNORMAL HIGH (ref 11.5–15.5)
Smear Review: NORMAL
WBC Count: 2.7 10*3/uL — ABNORMAL LOW (ref 4.0–10.5)
nRBC: 0 % (ref 0.0–0.2)

## 2021-06-01 LAB — PREPARE RBC (CROSSMATCH)

## 2021-06-01 LAB — SAMPLE TO BLOOD BANK

## 2021-06-01 MED ORDER — HEPARIN SOD (PORK) LOCK FLUSH 100 UNIT/ML IV SOLN
500.0000 [IU] | Freq: Once | INTRAVENOUS | Status: AC
Start: 2021-06-01 — End: 2021-06-01
  Administered 2021-06-01: 500 [IU]

## 2021-06-01 MED ORDER — LIDOCAINE VISCOUS HCL 2 % MT SOLN: 15.0000 mL | OROMUCOSAL | 0 refills | Status: DC | PRN

## 2021-06-01 MED ORDER — PROMETHAZINE HCL 12.5 MG PO TABS: 12.5000 mg | ORAL_TABLET | Freq: Four times a day (QID) | ORAL | 0 refills | Status: DC | PRN

## 2021-06-01 MED ORDER — SODIUM CHLORIDE 0.9% FLUSH
10.0000 mL | Freq: Once | INTRAVENOUS | Status: AC
  Administered 2021-06-01: 10 mL

## 2021-06-01 MED ORDER — SODIUM CHLORIDE 0.9 % IV SOLN: Freq: Once | INTRAVENOUS | Status: AC

## 2021-06-01 NOTE — Telephone Encounter (Signed)
Wife notified to bring pt to cancer center for [facility] -appt at 11:00

## 2021-06-01 NOTE — Patient Instructions (Signed)
Your blood transfusion is scheduled for tomorrow morning at 8 AM.   We rechecked your labs today. If tomorrow they say you do not need a transfusion because of a good hemoglobin please tell the staff know you are supposed to receive transfusion because you are severely symptomatic and it was approved by the clinic on Friday.    Medications sent to your pharmacy for nausea and mouth pain. Take as prescribed and if needed. Do not drive or work while taking phenergan as it can make you tired.

## 2021-06-01 NOTE — Progress Notes (Signed)
Symptom Management Consult note [place]    Patient Care Team: [name], NP as PCP - General (Family Medicine) Lorretta Harp, MD as PCP - Cardiology (Cardiology) Specialists, [place] (Orthopedic Surgery)    Name of the patient: Sean Rose  [id number]  October 05, 1955   Date of visit: 06/01/2021    Chief complaint/ Reason for visit- weakness, nausea, mouth pain  Oncology History  Adenocarcinoma of right lung, stage 3 ([place]) (Resolved)  10/13/2018 Initial Diagnosis   Adenocarcinoma of right lung, stage 3 ([place])   10/27/2018 - 01/20/2019 Chemotherapy          09/12/2020 Cancer Staging   Staging form: Lung, AJCC 8th Edition - Clinical: Stage IIIA (cT3, cN1, cM0) - Signed by [name], MD on 09/12/2020    Adenocarcinoma of right lung, stage 4 ([place])  10/30/2020 Initial Diagnosis   Adenocarcinoma of right lung, stage 4 ([place])   11/06/2020 -  Chemotherapy   Patient is on Treatment Plan : LUNG CARBOplatin / Pemetrexed / Pembrolizumab q21d Induction x 4 cycles / Maintenance Pemetrexed + Pembrolizumab     11/14/2020 Cancer Staging   Staging form: Lung, AJCC 8th Edition - Clinical: Stage IVB (cT3, cN1, cM1c) - Signed by [name], MD on 11/14/2020     Current therapy: Burnett Kanaris, last received day 1 cycle 10 05/23/21.  Interval history- Xavi. Avan Gullett is a 65 yo male with above mentioned history presenting to [facility] today with chief complaint of weakness and nausea. Patient states since his last treatment x9 days ago he has had constant worsening generalized weakness and intermittent nausea. He takes zofran at home however does not feel like that is helping. He admits to sleeping for most of the day because he is too weak to get out of bed. He is having a hard time with ADLs and had to end his beach vacation early because he was feeling too weak. He states this feels similar to when he had anemia in the past requiring  transfusions. Patient saw [facility] recently for endoscopy. He mentioned having "chemo mouth" and was prescribed fluconazole by GI as well. He has been taking it x 1 week with some improvement although continues to have burning mouth pain and dry cracked lips. He has decreased Po intake because of pain. He is trying to keeps lips moisturized with vaseline. Patient saw GI again yesterday and had basic labs drawn. He denies fever, chills, chest pain, shortness of breath, easy bruising or bleeding, epistaxis, blood in stool, abdominal pain, emesis, urinary symptoms, diarrhea.    ROS  All other systems are reviewed and are negative for acute change except as noted in the HPI.    Allergies  Allergen Reactions   Compazine [Prochlorperazine] Other (See Comments)    " made him high and could not sleep". He does not want to take it again.     Past Medical History:  Diagnosis Date   Allergy    Anemia    Anginal pain ([place])    Blood transfusion without reported diagnosis    CAD (coronary artery disease)    CAP (community acquired pneumonia) 09/2016   COPD (chronic obstructive pulmonary disease) (HCC)    Emphysema of lung (HCC)    GERD (gastroesophageal reflux disease)    Heart murmur    "I was told I had one when I was a kid"   Hemorrhoids    History of anal fissures    "no  surgeries" (10/30/2016)   Hyperlipidemia    Hypertension    lung ca dx'd 08/2018   with mets to bones in [place]   Myocardial infarction [facility])    Peripheral arterial disease ([place])    status post right common iliac artery stenting back in 2007   Seasonal allergies    Tobacco abuse      Past Surgical History:  Procedure Laterality Date   ANKLE SURGERY Left    "rebuilt it"   ANTERIOR CRUCIATE LIGAMENT REPAIR Right    CARDIAC CATHETERIZATION  09/23/2008   Continued medical therapy - may need GI evaluation in addition.   CARDIAC CATHETERIZATION  10/28/2007   Medical therapy recommended.   CARDIAC CATHETERIZATION  11/18/2006    In-stent restenosis RCA  (50% distal edge, 80% segmental mid, and 50-60% segmental proximal). Successful cutting balloon atherectomy using a [id number] cutting balloon. 3 inflations with atherectomy performed on mid and proximal portions resulting in reduction of 80% mid in-stent restenosis to less than 20% residual and 50-60% segmental proximal to less than 20% residual without dissection.   CARDIAC CATHETERIZATION  02/26/2006   Severe stenosis in RCA. Stenting performed using IVUS. 3.5x20 Maverick balloon deployed at [employer]. Distal stent-a 4x28 Liberte stent-deployed 12atm 48sec, 12atm 31sec, 4atm 19sec. Mid stent-a 4x28 Liberte stent-deployed 14atm 45sec, 14atm 60sec, 14atm 44sec. Proximal stent-4x8 Liberte- 14atm 45sec,14atm 47sec, 16atm 43sec. Severely diseased segment then appeared TIMI-3 flow.   CARDIOVASCULAR STRESS TEST  11/17/2012   No significant ECG changes. Septal perfusion defect is new when complared to study from 2010. Abnormal myocardial perfusion imaging with a basal to mid perfusion suggestive of previous MI.   CAROTID DOPPLER  08/09/2011   Bilateral Bulb/Proximal ICA - demonstrated a mild amount of fibrous plaque without evidence of significant diameter reduction reduction or other vascular abnormality.   CHEST TUBE INSERTION Right 09/02/2018   Procedure: Chest Tube Insertion;  Surgeon: Garner Nash, DO;  Location: [place];  Service: Thoracic;  Laterality: Right;   COLONOSCOPY     2003, 2014   CORONARY ANGIOPLASTY WITH STENT PLACEMENT     FEMORAL ARTERY STENT     INGUINAL HERNIA REPAIR Right    IR IMAGING GUIDED PORT INSERTION  11/13/2020   KNEE ARTHROSCOPY Right "multiple"   LAPAROSCOPIC APPENDECTOMY N/A 12/07/2018   Procedure: APPENDECTOMY LAPAROSCOPIC;  Surgeon: Coralie Keens, MD;  Location: [facility];  Service: General;  Laterality: N/A;   LOWER EXTREMITY ARTERIAL DOPPLER  01/31/2011   Bilateral ABIs-normal values with no suggestion of arterial insuff to the lower extremities  at rest. Right CIA stent-mild amount of nonhemodynamically significant plaque is noted throughout   [place]   "bone-eating tumor"   PERCUTANEOUS STENT INTERVENTION  04/04/2006 & 04/13/2015   a. Right common iliac artery with an 8.0x18 mm Herculink stent deployed at 12 atm. Stenosis was reduced from 80% to 0% with brisk flow. b. I-cast stenting to left common iliac artery   PERIPHERAL VASCULAR CATHETERIZATION N/A 04/13/2015   Procedure: Lower Extremity Angiography;  Surgeon: Lorretta Harp, MD; L-oCIA 75%, 40-50% L-EIA, R-CIA stent patent, s/p 8 mm x 38 mm ICast covered stent>>0% stenosis in [place] Right    TRANSTHORACIC ECHOCARDIOGRAM  11/26/2012   EF not noted. Aortic valve-sclerosis without stenosis, no regurgiation.    UPPER GASTROINTESTINAL ENDOSCOPY     US CAROTID DOPPLER BILATERAL ([place] HX)  08/09/2011   Bilateral Bulb/Proximal ICAa demonstrated a mild amount of  fibrous plaque without evidence of significant diameter reduction or any other vascular abnormality.   VIDEO ASSISTED THORACOSCOPY (VATS)/ LOBECTOMY Right 09/14/2018   Procedure: RIGHT VIDEO ASSISTED THORACOSCOPY (VATS)/ RIGHT UPPER LOBECTOMY;  Surgeon: Melrose Nakayama, MD;  Location: [place];  Service: Thoracic;  Laterality: Right;   VIDEO BRONCHOSCOPY WITH ENDOBRONCHIAL NAVIGATION N/A 09/02/2018   Procedure: VIDEO BRONCHOSCOPY WITH ENDOBRONCHIAL NAVIGATION;  Surgeon: Garner Nash, DO;  Location: [place];  Service: Thoracic;  Laterality: N/A;    Social History   Socioeconomic History   Marital status: Married    Spouse name: Not on file   Number of children: 2   Years of education: Not on file   Highest education level: Not on file  Occupational History   Occupation: retired  Tobacco Use   Smoking status: Former    Packs/day: 0.25    Years: 38.00    Pack years: 9.50    Types: Cigarettes    Quit date: 10/19/2018    Years since quitting: 2.6   Smokeless  tobacco: Never   Tobacco comments:    pack per day 12.17.19  Vaping Use   Vaping Use: Former  Substance and Sexual Activity   Alcohol use: Not Currently    Comment: socially   Drug use: No   Sexual activity: Yes  Other Topics Concern   Not on file  Social History Narrative   Patient is married with 2 children   Former smoker no tobacco now no drug use occasional/social alcohol   Social Determinants of Radio broadcast assistant Strain: Not on file  Food Insecurity: Not on file  Transportation Needs: Not on file  Physical Activity: Not on file  Stress: Not on file  Social Connections: Not on file  Intimate Partner Violence: Not on file    Family History  Problem Relation Age of Onset   Colon cancer Mother    Heart disease Father    Heart disease Paternal Grandfather    Rectal cancer Other    Esophageal cancer Neg Hx    Stomach cancer Neg Hx      Current Outpatient Medications:    lidocaine (XYLOCAINE) 2 % solution, Use as directed 15 mLs in the mouth or throat as needed for mouth pain., Disp: 100 mL, Rfl: 0   promethazine (PHENERGAN) 12.5 MG tablet, Take 1 tablet (12.5 mg total) by mouth every 6 (six) hours as needed for nausea or vomiting., Disp: 30 tablet, Rfl: 0   amLODipine (NORVASC) 5 MG tablet, Take 1 tablet (5 mg total) by mouth daily., Disp: 180 tablet, Rfl: 3   aspirin EC 81 MG tablet, Take 81 mg by mouth daily., Disp: , Rfl:    cetirizine (ZYRTEC) 10 MG tablet, Take 10 mg by mouth at bedtime as needed for allergies., Disp: , Rfl:    clopidogrel (PLAVIX) 75 MG tablet, TAKE 1 TABLET BY MOUTH EVERY DAY, Disp: 90 tablet, Rfl: 3   esomeprazole (NEXIUM) 20 MG capsule, Take 40 mg by mouth daily., Disp: , Rfl:    fluconazole (DIFLUCAN) 50 MG tablet, Take 4 tablets day one and then 2 tablets daily until complete course, Disp: 30 tablet, Rfl: 0   folic acid (FOLVITE) 1 MG tablet, TAKE 1 TABLET(1 MG) BY MOUTH DAILY, Disp: 30 tablet, Rfl: 4   lidocaine-prilocaine (EMLA)  cream, Apply to the Port-A-Cath site 30-60 minutes before treatment., Disp: 30 g, Rfl: 0   Magnesium 500 MG TABS, Take 1 tablet (500 mg total) by mouth 3 (  three) times daily., Disp: 8 tablet, Rfl: 0   metoprolol succinate (TOPROL XL) 25 MG 24 hr tablet, Take 1 tablet (25 mg total) by mouth daily., Disp: 90 tablet, Rfl: 3   Multiple Vitamin (MULTIVITAMIN WITH MINERALS) TABS tablet, Take 1 tablet by mouth daily., Disp: , Rfl:    nitroGLYCERIN (NITROSTAT) 0.4 MG SL tablet, PLACE 1 TABLET UNDER THE TONGUE EVERY 5 MINUTES AS NEEDED FOR CHEST PAIN., Disp: 25 tablet, Rfl: 6   ondansetron (ZOFRAN) 8 MG tablet, TAKE 1 TABLET(8 MG) BY MOUTH EVERY 8 HOURS AS NEEDED FOR NAUSEA OR VOMITING, Disp: 20 tablet, Rfl: 0   rosuvastatin (CRESTOR) 5 MG tablet, Take 1 tablet (5 mg total) by mouth every other day., Disp: 45 tablet, Rfl: 3   umeclidinium-vilanterol (ANORO ELLIPTA) 62.5-25 MCG/INH AEPB, Inhale 1 puff into the lungs daily., Disp: 60 each, Rfl: 5  PHYSICAL EXAM: ECOG FS:1 - Symptomatic but completely ambulatory   T: 99.2 F     BP: 128/73     HR: 91   Respirations: 18 O2: 100% on room air  Physical Exam Vitals and nursing note reviewed.  Constitutional:      General: He is not in acute distress.    Appearance: He is not ill-appearing.     Comments: Thin appearing male  HENT:     Head: Normocephalic and atraumatic.     Right Ear: External ear normal.     Left Ear: External ear normal.     Nose: Nose normal.     Mouth/Throat:     Mouth: Mucous membranes are dry.     Pharynx: Oropharynx is clear.     Comments: No white plaques seen in mouth, no open sores. Lips are dry and cracked. No gum bleeding noted. Eyes:     General: No scleral icterus.       Right eye: No discharge.        Left eye: No discharge.     Extraocular Movements: Extraocular movements intact.     Conjunctiva/sclera: Conjunctivae normal.     Pupils: Pupils are equal, round, and reactive to light.  Neck:     Vascular: No JVD.   Cardiovascular:     Rate and Rhythm: Normal rate and regular rhythm.     Pulses: Normal pulses.          Radial pulses are 2+ on the right side and 2+ on the left side.     Heart sounds: Normal heart sounds.  Pulmonary:     Comments: Lungs clear to auscultation in all fields. Symmetric chest rise. No wheezing, rales, or rhonchi. Chest:     Comments: Port in right upper chest without signs of infection Abdominal:     Comments: Abdomen is soft, non-distended, and non-tender in all quadrants. No rigidity, no guarding. No peritoneal signs.  Musculoskeletal:        General: Normal range of motion.     Cervical back: Normal range of motion.  Skin:    General: Skin is warm and dry.     Capillary Refill: Capillary refill takes less than 2 seconds.  Neurological:     Mental Status: He is oriented to person, place, and time.     GCS: GCS eye subscore is 4. GCS verbal subscore is 5. GCS motor subscore is 6.     Comments: Fluent speech, no facial droop.  Psychiatric:        Behavior: Behavior normal.       LABORATORY DATA: I have  reviewed the data as listed CBC Latest Ref Rng & Units 06/01/2021 05/31/2021 05/23/2021  WBC 4.0 - 10.5 K/uL 2.7(L) 3.0(L) 6.3  Hemoglobin 13.0 - 17.0 g/dL 6.9(LL) 8.3(L) 8.2(L)  Hematocrit 39.0 - 52.0 % 19.6(L) 23.4 Repeated and verified X2.(LL) 23.0(L)  Platelets 150 - 400 K/uL 169 262.0 434(H)     CMP Latest Ref Rng & Units 05/31/2021 05/16/2021 05/09/2021  Glucose 70 - 99 mg/dL 113(H) 100(H) 107(H)  BUN 6 - 23 mg/dL _0 Creatinine 0.40 - 1.50 mg/dL 1.47 1.25(H) 1.66(H)  Sodium 135 - 145 mEq/L 126(L) 127(L) 130(L)  Potassium 3.5 - 5.1 mEq/L 3.8 3.1(L) 3.1(L)  Chloride 96 - 112 mEq/L 88(L) 89(L) 86(L)  CO2 19 - 32 mEq/L 28 26 33(H)  Calcium 8.4 - 10.5 mg/dL 9.4 9.0 9.2  Total Protein 6.0 - 8.3 g/dL 7.4 6.9 7.4  Total Bilirubin 0.2 - 1.2 mg/dL 0.8 0.4 0.7  Alkaline Phos 39 - 117 U/L 104 89 90  AST 0 - 37 U/L 92(H) 38 86(H)  ALT 0 - 53 U/L 67(H) 27  66(H)       RADIOGRAPHIC STUDIES: I have personally reviewed the radiological images as listed and agreed with the findings in the report. No images are attached to the encounter. CT Chest W Contrast  Result Date: 05/15/2021 CLINICAL DATA:  Non-small cell lung cancer restaging EXAM: CT CHEST, ABDOMEN, AND PELVIS WITH CONTRAST TECHNIQUE: Multidetector CT imaging of the chest, abdomen and pelvis was performed following the standard protocol during bolus administration of intravenous contrast. CONTRAST:  42m OMNIPAQUE IOHEXOL 350 MG/ML SOLN COMPARISON:  03/01/2021 FINDINGS: CT CHEST FINDINGS Cardiovascular: Right Port-A-Cath tip: Lower SVC. Fairly narrow SVC, significance uncertain. Coronary, aortic arch, and branch vessel atherosclerotic vascular disease. Mitral valve calcification again noted. Mediastinum/Nodes: No pathologic adenopathy observed. Lungs/Pleura: Stable right apical pleuroparenchymal scarring. Reduced although not completely resolved opacity in the apical segment left upper lobe as on image 24 series 4. Stable postoperative findings of right upper lobectomy along with potential wedge resections. Bilateral chronic interstitial accentuation and emphysema. Accentuated triangular density along the upper margin of the left major fissure for example on image 45 series 4, potentially from atelectasis or fluid in the fissure, increased from prior. There is some increase rounded airspace opacity along the inferior pulmonary ligament and diaphragm measuring about 4.1 by 1.9 by 0.7 cm on image 119 series 4, possibilities may include pleural fluid, atelectasis or pneumonia, or possibly tumor. Trace pleural fluid posterior to the thoracic aorta for example on image 32 series 2. Musculoskeletal: Lytic and sclerotic lesion of the left scapula including the left glenoid roughly similar to prior. Please note that the entirety of the scapula was not included. CT ABDOMEN PELVIS FINDINGS Hepatobiliary:  Unremarkable Pancreas: Unremarkable aside for a punctate calcification in the pancreatic head which is considered benign. Spleen: Unremarkable Adrenals/Urinary Tract: Minimally thickened adrenal glands without adrenal mass. Vascular calcification in the right renal hilum. Calcification posteriorly along the left renal parenchyma on image 21 series 7 common no change from 09/25/2020. Stomach/Bowel: Diffuse wall thickening in the gastric fundus and body was not previously hypermetabolic, and is probably mainly from nondistention although a component gastritis is not excluded. Vascular/Lymphatic: Prominent atherosclerosis is present, including aortoiliac atherosclerotic disease. No pathologic adenopathy noted. Retroaortic left renal vein. Common iliac artery stents. Reproductive: Unremarkable Other: No supplemental non-categorized findings. Musculoskeletal: Unremarkable IMPRESSION: 1. Stable appearance of the mixed density lesion of the left scapula compatible with prior osseous metastatic disease. 2. Reduced  airspace opacity at the left lung apex compared to the prior exam, some of this may be related to prior radiation therapy. 3. Small left pleural effusion, with density along the top of the left major fissure and along the left hemidiaphragm probably related to pleural fluid or atelectasis, with malignant involvement being substantially less likely. Attention on surveillance imaging suggested. 4. Other imaging findings of potential clinical significance: Aortic Atherosclerosis (ICD10-I70.0). Coronary atherosclerosis. Emphysema (ICD10-J43.9). Right upper lobectomy. Prominent atherosclerosis. Electronically Signed   By: Van Clines M.D.   On: 05/15/2021 12:30   CT Abdomen Pelvis W Contrast  Result Date: 05/15/2021 CLINICAL DATA:  Non-small cell lung cancer restaging EXAM: CT CHEST, ABDOMEN, AND PELVIS WITH CONTRAST TECHNIQUE: Multidetector CT imaging of the chest, abdomen and pelvis was performed  following the standard protocol during bolus administration of intravenous contrast. CONTRAST:  70m OMNIPAQUE IOHEXOL 350 MG/ML SOLN COMPARISON:  03/01/2021 FINDINGS: CT CHEST FINDINGS Cardiovascular: Right Port-A-Cath tip: Lower SVC. Fairly narrow SVC, significance uncertain. Coronary, aortic arch, and branch vessel atherosclerotic vascular disease. Mitral valve calcification again noted. Mediastinum/Nodes: No pathologic adenopathy observed. Lungs/Pleura: Stable right apical pleuroparenchymal scarring. Reduced although not completely resolved opacity in the apical segment left upper lobe as on image 24 series 4. Stable postoperative findings of right upper lobectomy along with potential wedge resections. Bilateral chronic interstitial accentuation and emphysema. Accentuated triangular density along the upper margin of the left major fissure for example on image 45 series 4, potentially from atelectasis or fluid in the fissure, increased from prior. There is some increase rounded airspace opacity along the inferior pulmonary ligament and diaphragm measuring about 4.1 by 1.9 by 0.7 cm on image 119 series 4, possibilities may include pleural fluid, atelectasis or pneumonia, or possibly tumor. Trace pleural fluid posterior to the thoracic aorta for example on image 32 series 2. Musculoskeletal: Lytic and sclerotic lesion of the left scapula including the left glenoid roughly similar to prior. Please note that the entirety of the scapula was not included. CT ABDOMEN PELVIS FINDINGS Hepatobiliary: Unremarkable Pancreas: Unremarkable aside for a punctate calcification in the pancreatic head which is considered benign. Spleen: Unremarkable Adrenals/Urinary Tract: Minimally thickened adrenal glands without adrenal mass. Vascular calcification in the right renal hilum. Calcification posteriorly along the left renal parenchyma on image 21 series 7 common no change from 09/25/2020. Stomach/Bowel: Diffuse wall thickening in  the gastric fundus and body was not previously hypermetabolic, and is probably mainly from nondistention although a component gastritis is not excluded. Vascular/Lymphatic: Prominent atherosclerosis is present, including aortoiliac atherosclerotic disease. No pathologic adenopathy noted. Retroaortic left renal vein. Common iliac artery stents. Reproductive: Unremarkable Other: No supplemental non-categorized findings. Musculoskeletal: Unremarkable IMPRESSION: 1. Stable appearance of the mixed density lesion of the left scapula compatible with prior osseous metastatic disease. 2. Reduced airspace opacity at the left lung apex compared to the prior exam, some of this may be related to prior radiation therapy. 3. Small left pleural effusion, with density along the top of the left major fissure and along the left hemidiaphragm probably related to pleural fluid or atelectasis, with malignant involvement being substantially less likely. Attention on surveillance imaging suggested. 4. Other imaging findings of potential clinical significance: Aortic Atherosclerosis (ICD10-I70.0). Coronary atherosclerosis. Emphysema (ICD10-J43.9). Right upper lobectomy. Prominent atherosclerosis. Electronically Signed   By: WVan ClinesM.D.   On: 05/15/2021 12:30     ASSESSMENT & PLAN: Patient is a 65y.o. male with history of Metastatic non-small cell lung cancer initially diagnosed as stage IIIA (  T3, N1, M0) non-small cell lung cancer, adenocarcinoma with no actionable mutations presented with multiple pulmonary nodules in the right upper lobe as well as metastatic disease in intraparenchymal lymphadenopathy.  This was diagnosed in February 2020.  The patient has disease recurrence in March 2022. Followed by oncologist Dr. [name].   Visit Diagnosis: 1. Anemia, unspecified type   2. Mouth pain   3. Adenocarcinoma of right lung, stage 4 ([place])   4. Nausea without vomiting     #)Weakness- Viewed patient's labs from GI  office yesterday. CBC shows hemoglobin 8.3. Patient given liter of IVF here as he has had poor PO intake secondary to mouth pain which could be relating to weakness. Patient admits to profound weakness and would feel improved with blood transfusion. Discussed hemoglobin 8.3 and stable vitals does not meet criteria for transfusion, although as he is symptomatic it would be reasonable to transfuse. Patient agreeable to have labs drawn with type and screen and return tomorrow for appointment for transfusion. CBC resulted after patient was discharged from clinic and shows hemoglobin 6.9, HCT 19.6, normal platelets. Also shows leukopenia 2.7 and ANC 1.5. He is afebrile. Patient knows to return tomorrow  AM to infusion center for transfusion, 2 units ordered.  #) Mouth pain- currently taking Fluconazole as prescribed by GI for esophageal candidiasis. Exam without obvious signs of thrush or mucositis. Symptoms likely improved with antifungal, encourage him to finish prescription. Will prescribed viscous lidocaine to help with pain.   #) Nausea- Not controlled with home medication zofran. Has documented allergy to compazine, therefore will prescribe low dose phenergan for him to try. Advised not to drive or work while taking as it can make him drowsy. Offered nausea medicine here in clinic, he politely declined.   #) Metastatic non-small cell lung cancer- continue treatment per primary oncologist.    No orders of the defined types were placed in this encounter.   All questions were answered. The patient knows to call the clinic with any problems, questions or concerns. No barriers to learning was detected.  I have spent a total of 30 minutes minutes of face-to-face and non-face-to-face time, preparing to see the patient, obtaining and/or reviewing separately obtained history, performing a medically appropriate examination, counseling and educating the patient, ordering tests,  documenting clinical information  in the electronic health record, and care coordination.     Thank you for allowing me to participate in the care of this patient.    Barrie Folk, PA-C Department of Hematology/Oncology [facility] at [facility] Phone: [phone]  [phone]    06/01/2021 2:29 PM

## 2021-06-01 NOTE — Progress Notes (Signed)
, °

## 2021-06-02 ENCOUNTER — Inpatient Hospital Stay: Payer: BC Managed Care – PPO

## 2021-06-02 ENCOUNTER — Other Ambulatory Visit: Payer: Self-pay

## 2021-06-02 VITALS — BP 112/76 | HR 71 | Temp 98.6°F | Resp 17

## 2021-06-02 DIAGNOSIS — R531 Weakness: Secondary | ICD-10-CM | POA: Diagnosis not present

## 2021-06-02 DIAGNOSIS — C3411 Malignant neoplasm of upper lobe, right bronchus or lung: Secondary | ICD-10-CM | POA: Diagnosis not present

## 2021-06-02 DIAGNOSIS — D649 Anemia, unspecified: Secondary | ICD-10-CM | POA: Diagnosis not present

## 2021-06-02 DIAGNOSIS — D539 Nutritional anemia, unspecified: Secondary | ICD-10-CM

## 2021-06-02 DIAGNOSIS — B3781 Candidal esophagitis: Secondary | ICD-10-CM | POA: Diagnosis not present

## 2021-06-02 DIAGNOSIS — Z5111 Encounter for antineoplastic chemotherapy: Secondary | ICD-10-CM | POA: Diagnosis not present

## 2021-06-02 DIAGNOSIS — C770 Secondary and unspecified malignant neoplasm of lymph nodes of head, face and neck: Secondary | ICD-10-CM | POA: Diagnosis not present

## 2021-06-02 DIAGNOSIS — K1379 Other lesions of oral mucosa: Secondary | ICD-10-CM | POA: Diagnosis not present

## 2021-06-02 DIAGNOSIS — R110 Nausea: Secondary | ICD-10-CM | POA: Diagnosis not present

## 2021-06-02 DIAGNOSIS — Z95828 Presence of other vascular implants and grafts: Secondary | ICD-10-CM

## 2021-06-02 DIAGNOSIS — C7951 Secondary malignant neoplasm of bone: Secondary | ICD-10-CM | POA: Diagnosis not present

## 2021-06-02 DIAGNOSIS — C3491 Malignant neoplasm of unspecified part of right bronchus or lung: Secondary | ICD-10-CM

## 2021-06-02 MED ORDER — DIPHENHYDRAMINE HCL 25 MG PO CAPS
ORAL_CAPSULE | ORAL | Status: AC
  Filled 2021-06-02: qty 1

## 2021-06-02 MED ORDER — SODIUM CHLORIDE 0.9% IV SOLUTION: 250.0000 mL | Freq: Once | INTRAVENOUS | Status: DC

## 2021-06-02 MED ORDER — SODIUM CHLORIDE 0.9% FLUSH: 10.0000 mL | Freq: Once | INTRAVENOUS | Status: DC

## 2021-06-02 MED ORDER — ACETAMINOPHEN 325 MG PO TABS
650.0000 mg | ORAL_TABLET | Freq: Once | ORAL | Status: AC
  Administered 2021-06-02: 650 mg via ORAL

## 2021-06-02 MED ORDER — SODIUM CHLORIDE 0.9% FLUSH
10.0000 mL | INTRAVENOUS | Status: AC | PRN
  Administered 2021-06-02: 10 mL

## 2021-06-02 MED ORDER — HEPARIN SOD (PORK) LOCK FLUSH 100 UNIT/ML IV SOLN
500.0000 [IU] | Freq: Once | INTRAVENOUS | Status: AC
  Administered 2021-06-02: 500 [IU]

## 2021-06-02 MED ORDER — DIPHENHYDRAMINE HCL 25 MG PO CAPS
25.0000 mg | ORAL_CAPSULE | Freq: Once | ORAL | Status: AC
  Administered 2021-06-02: 25 mg via ORAL

## 2021-06-02 MED ORDER — HEPARIN SOD (PORK) LOCK FLUSH 100 UNIT/ML IV SOLN: 500.0000 [IU] | Freq: Every day | INTRAVENOUS | Status: DC | PRN

## 2021-06-02 NOTE — Patient Instructions (Signed)

## 2021-06-04 LAB — TYPE AND SCREEN
ABO/RH(D): A POS
Antibody Screen: NEGATIVE
Unit division: 0

## 2021-06-04 LAB — BPAM RBC
Blood Product Expiration Date: 202211292359
ISSUE DATE / TIME: 202211050835
Unit Type and Rh: 6200

## 2021-06-05 ENCOUNTER — Telehealth: Payer: Self-pay | Admitting: Pulmonary Disease

## 2021-06-05 NOTE — Telephone Encounter (Signed)
I called the patient and left a message to have him make a follow up with Dr. Valeta Harms for refills on his medication.

## 2021-06-06 ENCOUNTER — Other Ambulatory Visit: Payer: BC Managed Care – PPO

## 2021-06-06 ENCOUNTER — Inpatient Hospital Stay: Payer: BC Managed Care – PPO

## 2021-06-06 ENCOUNTER — Encounter: Payer: BC Managed Care – PPO | Admitting: Dietician

## 2021-06-06 ENCOUNTER — Ambulatory Visit: Payer: BC Managed Care – PPO | Admitting: Internal Medicine

## 2021-06-08 ENCOUNTER — Other Ambulatory Visit: Payer: Self-pay | Admitting: Physician Assistant

## 2021-06-08 DIAGNOSIS — C3491 Malignant neoplasm of unspecified part of right bronchus or lung: Secondary | ICD-10-CM

## 2021-06-08 NOTE — Progress Notes (Signed)
[facility] OFFICE PROGRESS NOTE  Sean Peng, NP [place] [address]  DIAGNOSIS:  Metastatic non-small cell lung cancer initially diagnosed as stage IIIA (T3, N1, M0) non-small cell lung cancer, adenocarcinoma with no actionable mutations presented with multiple pulmonary nodules in the right upper lobe as well as metastatic disease in intraparenchymal lymphadenopathy.  This was diagnosed in February 2020.  The patient has disease recurrence in March 2022. PD-L1 expression 20%. He has no actionable mutations by foundation 1.  PRIOR THERAPY: 1) Status post right upper lobectomy with lymph node dissection. 2) Adjuvant systemic chemotherapy with cisplatin 75 mg/M2 and Alimta 500 mg/M2 every 3 weeks.  Status post 4 cycles. 3) palliative radiotherapy to the metastatic bone disease and the scapula as well as the left supraclavicular area under the care of Dr. Isidore Moos  CURRENT THERAPY: Systemic chemotherapy with carboplatin for AUC of 5, Alimta 500 mg/M2 and Keytruda 200 mg IV every 3 weeks.  First dose November 06, 2020.  Status post 10 cycles.  Starting from cycle #5, the patient will be treated with maintenance Keytruda and Alimta IV every 3 weeks. Dose reduced to Alimta 400 mg/m2 starting from cycle #11 because of worsening anemia.  Keytruda discontinued from cycle #11 due to possible checkpoint inhibitor toxicity of the stomach.  INTERVAL HISTORY: Sean Rose 65 y.o. male returns to the clinic today for a follow-up visit. The patient has been having a challenging time the last couple months with intermittent nausea, vomiting, diarrhea, and progressively worsening anemia which has required blood transfusions.  The patient's most recent blood transfusion was on 06/02/2021 for 1 unit of blood.  The patient denies any overt bleeding except he states over the last couple weeks he has had diarrhea that appears to be "coffee black".  The patient underwent a upper  endoscopy which noted noted possible drug-induced injury to the stomach and duodenum possibly from his checkpoint inhibitor/Keytruda.  The patient has had limited p.o. intake secondary to pain, although he has been eating a little bit better over the last 3 to 4 days.  For the nausea, the patient did not have significant improvement with Zofran and was given a prescription for Phenergan.  This helped somewhat but he still has periods where his antiemetics are not helping.  He does not have a follow-up colonoscopy scheduled with GI.  Of note, the patient is on Plavix due to stents in his heart and in his legs.  He has been on Plavix since 2007.  He follows with cardiology and has an upcoming appointment in a few weeks.  The patient had a presyncopal episode in the clinic today.  He was walking in the hallway almost passed out and had to sit down.  He continues to have episodes of feeling "swimmy headed".   The patient was last seen on 05/16/2021.  The patient's chemotherapy with Alimta was reduced to 400 mg per metered squared at his last cycle of treatment.  Despite this, he did not have significant improvement in his symptoms.  He has been having significant generalized weakness and fatigue over the last few months which is not his baseline.  The patient denies any fever, chills, or night sweats.  He has lost weight due to having poor p.o. intake.  Unable to stand on the scale today.  The patient is scheduled to see the nutritionist while in the infusion room.  Having worsening shortness of breath with minimal exertion.  At nighttime he  sometimes has coughing fits. He denies any chest pain or hemoptysis. He denies any abdominal pain today.  Denies any rashes or skin changes.  The patient is here today for evaluation and repeat blood work before considering starting cycle #11 with Keytruda and Alimta.  MEDICAL HISTORY: Past Medical History:  Diagnosis Date   Allergy    Anemia    Anginal pain ([place])     Blood transfusion without reported diagnosis    CAD (coronary artery disease)    CAP (community acquired pneumonia) 09/2016   COPD (chronic obstructive pulmonary disease) (HCC)    Emphysema of lung (HCC)    GERD (gastroesophageal reflux disease)    Heart murmur    "I was told I had one when I was a kid"   Hemorrhoids    History of anal fissures    "no surgeries" (10/30/2016)   Hyperlipidemia    Hypertension    lung ca dx'd 08/2018   with mets to bones in [place]   Myocardial infarction [facility])    Peripheral arterial disease ([place])    status post right common iliac artery stenting back in 2007   Seasonal allergies    Tobacco abuse     ALLERGIES:  is allergic to compazine [prochlorperazine].  MEDICATIONS:  Current Outpatient Medications  Medication Sig Dispense Refill   potassium chloride SA (KLOR-CON) 20 MEQ tablet Take 1 tablet (20 mEq total) by mouth daily. 10 tablet 0   amLODipine (NORVASC) 5 MG tablet Take 1 tablet (5 mg total) by mouth daily. 180 tablet 3   aspirin EC 81 MG tablet Take 81 mg by mouth daily.     cetirizine (ZYRTEC) 10 MG tablet Take 10 mg by mouth at bedtime as needed for allergies.     clopidogrel (PLAVIX) 75 MG tablet TAKE 1 TABLET BY MOUTH EVERY DAY 90 tablet 3   esomeprazole (NEXIUM) 20 MG capsule Take 40 mg by mouth daily.     fluconazole (DIFLUCAN) 50 MG tablet Take 4 tablets day one and then 2 tablets daily until complete course 30 tablet 0   folic acid (FOLVITE) 1 MG tablet TAKE 1 TABLET(1 MG) BY MOUTH DAILY 30 tablet 4   lidocaine (XYLOCAINE) 2 % solution Use as directed 15 mLs in the mouth or throat as needed for mouth pain. 100 mL 0   lidocaine-prilocaine (EMLA) cream Apply to the Port-A-Cath site 30-60 minutes before treatment. 30 g 0   Magnesium 500 MG TABS Take 1 tablet (500 mg total) by mouth 3 (three) times daily. 8 tablet 0   metoprolol succinate (TOPROL XL) 25 MG 24 hr tablet Take 1 tablet (25 mg total) by mouth daily. 90 tablet 3   Multiple Vitamin  (MULTIVITAMIN WITH MINERALS) TABS tablet Take 1 tablet by mouth daily.     nitroGLYCERIN (NITROSTAT) 0.4 MG SL tablet PLACE 1 TABLET UNDER THE TONGUE EVERY 5 MINUTES AS NEEDED FOR CHEST PAIN. 25 tablet 6   ondansetron (ZOFRAN) 8 MG tablet TAKE 1 TABLET(8 MG) BY MOUTH EVERY 8 HOURS AS NEEDED FOR NAUSEA OR VOMITING 20 tablet 0   promethazine (PHENERGAN) 12.5 MG tablet Take 1 tablet (12.5 mg total) by mouth every 6 (six) hours as needed for nausea or vomiting. 30 tablet 0   rosuvastatin (CRESTOR) 5 MG tablet Take 1 tablet (5 mg total) by mouth every other day. 45 tablet 3   umeclidinium-vilanterol (ANORO ELLIPTA) 62.5-25 MCG/INH AEPB Inhale 1 puff into the lungs daily. 60 each 5   No current facility-administered  medications for this visit.   Facility-Administered Medications Ordered in Other Visits  Medication Dose Route Frequency Provider Last Rate Last Admin   diphenhydrAMINE (BENADRYL) 25 mg capsule             SURGICAL HISTORY:  Past Surgical History:  Procedure Laterality Date   ANKLE SURGERY Left    "rebuilt it"   ANTERIOR CRUCIATE LIGAMENT REPAIR Right    CARDIAC CATHETERIZATION  09/23/2008   Continued medical therapy - may need GI evaluation in addition.   CARDIAC CATHETERIZATION  10/28/2007   Medical therapy recommended.   CARDIAC CATHETERIZATION  11/18/2006   In-stent restenosis RCA  (50% distal edge, 80% segmental mid, and 50-60% segmental proximal). Successful cutting balloon atherectomy using a [id number] cutting balloon. 3 inflations with atherectomy performed on mid and proximal portions resulting in reduction of 80% mid in-stent restenosis to less than 20% residual and 50-60% segmental proximal to less than 20% residual without dissection.   CARDIAC CATHETERIZATION  02/26/2006   Severe stenosis in RCA. Stenting performed using IVUS. 3.5x20 Maverick balloon deployed at [employer]. Distal stent-a 4x28 Liberte stent-deployed 12atm 48sec, 12atm 31sec, 4atm 19sec. Mid stent-a 4x28 Liberte  stent-deployed 14atm 45sec, 14atm 60sec, 14atm 44sec. Proximal stent-4x8 Liberte- 14atm 45sec,14atm 47sec, 16atm 43sec. Severely diseased segment then appeared TIMI-3 flow.   CARDIOVASCULAR STRESS TEST  11/17/2012   No significant ECG changes. Septal perfusion defect is new when complared to study from 2010. Abnormal myocardial perfusion imaging with a basal to mid perfusion suggestive of previous MI.   CAROTID DOPPLER  08/09/2011   Bilateral Bulb/Proximal ICA - demonstrated a mild amount of fibrous plaque without evidence of significant diameter reduction reduction or other vascular abnormality.   CHEST TUBE INSERTION Right 09/02/2018   Procedure: Chest Tube Insertion;  Surgeon: Garner Nash, DO;  Location: [facility];  Service: Thoracic;  Laterality: Right;   COLONOSCOPY     2003, 2014   CORONARY ANGIOPLASTY WITH STENT PLACEMENT     FEMORAL ARTERY STENT     INGUINAL HERNIA REPAIR Right    IR IMAGING GUIDED PORT INSERTION  11/13/2020   KNEE ARTHROSCOPY Right "multiple"   LAPAROSCOPIC APPENDECTOMY N/A 12/07/2018   Procedure: APPENDECTOMY LAPAROSCOPIC;  Surgeon: Coralie Keens, MD;  Location: [facility];  Service: General;  Laterality: N/A;   LOWER EXTREMITY ARTERIAL DOPPLER  01/31/2011   Bilateral ABIs-normal values with no suggestion of arterial insuff to the lower extremities at rest. Right CIA stent-mild amount of nonhemodynamically significant plaque is noted throughout   [place]   "bone-eating tumor"   PERCUTANEOUS STENT INTERVENTION  04/04/2006 & 04/13/2015   a. Right common iliac artery with an 8.0x18 mm Herculink stent deployed at 12 atm. Stenosis was reduced from 80% to 0% with brisk flow. b. I-cast stenting to left common iliac artery   PERIPHERAL VASCULAR CATHETERIZATION N/A 04/13/2015   Procedure: Lower Extremity Angiography;  Surgeon: Lorretta Harp, MD; L-oCIA 75%, 40-50% L-EIA, R-CIA stent patent, s/p 8 mm x 38 mm ICast covered stent>>0% stenosis in [place] Right    TRANSTHORACIC ECHOCARDIOGRAM  11/26/2012   EF not noted. Aortic valve-sclerosis without stenosis, no regurgiation.    UPPER GASTROINTESTINAL ENDOSCOPY     US CAROTID DOPPLER BILATERAL ([place] HX)  08/09/2011   Bilateral Bulb/Proximal ICAa demonstrated a mild amount of fibrous plaque without evidence of significant diameter reduction or any other vascular abnormality.   VIDEO ASSISTED THORACOSCOPY (VATS)/  LOBECTOMY Right 09/14/2018   Procedure: RIGHT VIDEO ASSISTED THORACOSCOPY (VATS)/ RIGHT UPPER LOBECTOMY;  Surgeon: Melrose Nakayama, MD;  Location: [facility];  Service: Thoracic;  Laterality: Right;   VIDEO BRONCHOSCOPY WITH ENDOBRONCHIAL NAVIGATION N/A 09/02/2018   Procedure: VIDEO BRONCHOSCOPY WITH ENDOBRONCHIAL NAVIGATION;  Surgeon: Garner Nash, DO;  Location: [facility];  Service: Thoracic;  Laterality: N/A;    REVIEW OF SYSTEMS:   Review of Systems  Constitutional: Positive for fatigue, generalized weakness, appetite change, and weight loss.  Negative for chills and fever. HENT: Negative for mouth sores, nosebleeds, sore throat and trouble swallowing.   Eyes: Negative for eye problems and icterus.  Respiratory: Positive for shortness of breath.  Positive for occasional coughing spells.  Negative for hemoptysis and wheezing.   Cardiovascular: Negative for chest pain and leg swelling.  Gastrointestinal: Stated for nausea and occasional dark stools.  Negative for abdominal pain, constipation, vomiting.  Genitourinary: Negative for bladder incontinence, difficulty urinating, dysuria, frequency and hematuria.   Musculoskeletal: Negative for back pain, gait problem, neck pain and neck stiffness.  Skin: Negative for itching and rash.  Neurological: Positive for generalized weakness, dizziness, and lightheadedness.  Negative for extremity weakness, gait problem, headaches, and seizures.  Hematological: Negative for adenopathy. Does not bruise/bleed easily.   Psychiatric/Behavioral: Negative for confusion, depression and sleep disturbance. The patient is not nervous/anxious.     PHYSICAL EXAMINATION:  Blood pressure 120/80, pulse 82, temperature 97.9 F (36.6 C), temperature source Oral, SpO2 100 %.  ECOG PERFORMANCE STATUS: 2-3  Physical Exam  Constitutional: Oriented to person, place, and time and thin and cachectic appearing male and in no distress.  HENT:  Head: Normocephalic and atraumatic.  Mouth/Throat: Oropharynx is clear and moist. No oropharyngeal exudate.  Eyes: Conjunctivae are normal. Right eye exhibits no discharge. Left eye exhibits no discharge. No scleral icterus.  Neck: Normal range of motion. Neck supple.  Cardiovascular: Normal rate, regular rhythm, normal heart sounds and intact distal pulses.   Pulmonary/Chest: Effort normal and breath sounds normal. No respiratory distress. No wheezes. No rales.  Abdominal: Soft. Bowel sounds are normal. Exhibits no distension and no mass. There is no tenderness.  Musculoskeletal: Normal range of motion. Exhibits no edema.  Lymphadenopathy:    No cervical adenopathy.  Neurological: Alert and oriented to person, place, and time.  Muscle wasting.  Patient was examined in the wheelchair.  Skin: Skin is warm and dry. No rash noted. Not diaphoretic. No erythema. No pallor.  Psychiatric: Mood, memory and judgment normal.  Vitals reviewed.  LABORATORY DATA: Lab Results  Component Value Date   WBC 4.9 06/13/2021   HGB 8.0 (L) 06/13/2021   HCT 22.3 (L) 06/13/2021   MCV 98.2 06/13/2021   PLT 204 06/13/2021      Chemistry      Component Value Date/Time   NA 125 (L) 06/13/2021 0821   NA 130 (L) 03/05/2017 1110   K 2.9 (L) 06/13/2021 0821   CL 90 (L) 06/13/2021 0821   CO2 24 06/13/2021 0821   BUN 10 06/13/2021 0821   BUN 4 (L) 03/05/2017 1110   CREATININE 1.45 (H) 06/13/2021 0821   CREATININE 0.76 04/28/2020 0826      Component Value Date/Time   CALCIUM 8.5 (L) 06/13/2021 0821    ALKPHOS 166 (H) 06/13/2021 0821   AST 98 (H) 06/13/2021 0821   ALT 96 (H) 06/13/2021 0821   BILITOT 0.2 (L) 06/13/2021 0821       RADIOGRAPHIC STUDIES:  CT Chest W  Contrast  Result Date: 05/15/2021 CLINICAL DATA:  Non-small cell lung cancer restaging EXAM: CT CHEST, ABDOMEN, AND PELVIS WITH CONTRAST TECHNIQUE: Multidetector CT imaging of the chest, abdomen and pelvis was performed following the standard protocol during bolus administration of intravenous contrast. CONTRAST:  53m OMNIPAQUE IOHEXOL 350 MG/ML SOLN COMPARISON:  03/01/2021 FINDINGS: CT CHEST FINDINGS Cardiovascular: Right Port-A-Cath tip: Lower SVC. Fairly narrow SVC, significance uncertain. Coronary, aortic arch, and branch vessel atherosclerotic vascular disease. Mitral valve calcification again noted. Mediastinum/Nodes: No pathologic adenopathy observed. Lungs/Pleura: Stable right apical pleuroparenchymal scarring. Reduced although not completely resolved opacity in the apical segment left upper lobe as on image 24 series 4. Stable postoperative findings of right upper lobectomy along with potential wedge resections. Bilateral chronic interstitial accentuation and emphysema. Accentuated triangular density along the upper margin of the left major fissure for example on image 45 series 4, potentially from atelectasis or fluid in the fissure, increased from prior. There is some increase rounded airspace opacity along the inferior pulmonary ligament and diaphragm measuring about 4.1 by 1.9 by 0.7 cm on image 119 series 4, possibilities may include pleural fluid, atelectasis or pneumonia, or possibly tumor. Trace pleural fluid posterior to the thoracic aorta for example on image 32 series 2. Musculoskeletal: Lytic and sclerotic lesion of the left scapula including the left glenoid roughly similar to prior. Please note that the entirety of the scapula was not included. CT ABDOMEN PELVIS FINDINGS Hepatobiliary: Unremarkable Pancreas:  Unremarkable aside for a punctate calcification in the pancreatic head which is considered benign. Spleen: Unremarkable Adrenals/Urinary Tract: Minimally thickened adrenal glands without adrenal mass. Vascular calcification in the right renal hilum. Calcification posteriorly along the left renal parenchyma on image 21 series 7 common no change from 09/25/2020. Stomach/Bowel: Diffuse wall thickening in the gastric fundus and body was not previously hypermetabolic, and is probably mainly from nondistention although a component gastritis is not excluded. Vascular/Lymphatic: Prominent atherosclerosis is present, including aortoiliac atherosclerotic disease. No pathologic adenopathy noted. Retroaortic left renal vein. Common iliac artery stents. Reproductive: Unremarkable Other: No supplemental non-categorized findings. Musculoskeletal: Unremarkable IMPRESSION: 1. Stable appearance of the mixed density lesion of the left scapula compatible with prior osseous metastatic disease. 2. Reduced airspace opacity at the left lung apex compared to the prior exam, some of this may be related to prior radiation therapy. 3. Small left pleural effusion, with density along the top of the left major fissure and along the left hemidiaphragm probably related to pleural fluid or atelectasis, with malignant involvement being substantially less likely. Attention on surveillance imaging suggested. 4. Other imaging findings of potential clinical significance: Aortic Atherosclerosis (ICD10-I70.0). Coronary atherosclerosis. Emphysema (ICD10-J43.9). Right upper lobectomy. Prominent atherosclerosis. Electronically Signed   By: WVan ClinesM.D.   On: 05/15/2021 12:30   CT Abdomen Pelvis W Contrast  Result Date: 05/15/2021 CLINICAL DATA:  Non-small cell lung cancer restaging EXAM: CT CHEST, ABDOMEN, AND PELVIS WITH CONTRAST TECHNIQUE: Multidetector CT imaging of the chest, abdomen and pelvis was performed following the standard protocol  during bolus administration of intravenous contrast. CONTRAST:  664mOMNIPAQUE IOHEXOL 350 MG/ML SOLN COMPARISON:  03/01/2021 FINDINGS: CT CHEST FINDINGS Cardiovascular: Right Port-A-Cath tip: Lower SVC. Fairly narrow SVC, significance uncertain. Coronary, aortic arch, and branch vessel atherosclerotic vascular disease. Mitral valve calcification again noted. Mediastinum/Nodes: No pathologic adenopathy observed. Lungs/Pleura: Stable right apical pleuroparenchymal scarring. Reduced although not completely resolved opacity in the apical segment left upper lobe as on image 24 series 4. Stable postoperative findings of right upper lobectomy along  with potential wedge resections. Bilateral chronic interstitial accentuation and emphysema. Accentuated triangular density along the upper margin of the left major fissure for example on image 45 series 4, potentially from atelectasis or fluid in the fissure, increased from prior. There is some increase rounded airspace opacity along the inferior pulmonary ligament and diaphragm measuring about 4.1 by 1.9 by 0.7 cm on image 119 series 4, possibilities may include pleural fluid, atelectasis or pneumonia, or possibly tumor. Trace pleural fluid posterior to the thoracic aorta for example on image 32 series 2. Musculoskeletal: Lytic and sclerotic lesion of the left scapula including the left glenoid roughly similar to prior. Please note that the entirety of the scapula was not included. CT ABDOMEN PELVIS FINDINGS Hepatobiliary: Unremarkable Pancreas: Unremarkable aside for a punctate calcification in the pancreatic head which is considered benign. Spleen: Unremarkable Adrenals/Urinary Tract: Minimally thickened adrenal glands without adrenal mass. Vascular calcification in the right renal hilum. Calcification posteriorly along the left renal parenchyma on image 21 series 7 common no change from 09/25/2020. Stomach/Bowel: Diffuse wall thickening in the gastric fundus and body was  not previously hypermetabolic, and is probably mainly from nondistention although a component gastritis is not excluded. Vascular/Lymphatic: Prominent atherosclerosis is present, including aortoiliac atherosclerotic disease. No pathologic adenopathy noted. Retroaortic left renal vein. Common iliac artery stents. Reproductive: Unremarkable Other: No supplemental non-categorized findings. Musculoskeletal: Unremarkable IMPRESSION: 1. Stable appearance of the mixed density lesion of the left scapula compatible with prior osseous metastatic disease. 2. Reduced airspace opacity at the left lung apex compared to the prior exam, some of this may be related to prior radiation therapy. 3. Small left pleural effusion, with density along the top of the left major fissure and along the left hemidiaphragm probably related to pleural fluid or atelectasis, with malignant involvement being substantially less likely. Attention on surveillance imaging suggested. 4. Other imaging findings of potential clinical significance: Aortic Atherosclerosis (ICD10-I70.0). Coronary atherosclerosis. Emphysema (ICD10-J43.9). Right upper lobectomy. Prominent atherosclerosis. Electronically Signed   By: Van Clines M.D.   On: 05/15/2021 12:30     ASSESSMENT/PLAN:  This is a very pleasant 65 year old Caucasian male who was initially diagnosed with stage IIIB non-small cell lung cancer, adenocarcinoma. He is status post a right upper lobectomy with lymph node dissection under the care of [name].  He was negative for any actionable mutations.  He completed adjuvant chemotherapy with cisplatin and Alimta for 4 cycles.  He tolerated this fairly well.  His PD-L1 expression is 20%.    The patient had been on observation until imaging studies in February 2022 showed suspicious bone lesions in the left shoulder area.  A PET scan was performed on 09/25/2020 which showed metastatic disease involving the left scapula and left supraclavicular  lymph nodes. The patient had a ultrasound-guided biopsy of the left supraclavicular lymph node and it was positive for metastatic carcinoma.   The patient then underwent palliative radiotherapy to the metastatic bone lesion in the left scapula under the care of Dr. Isidore Moos.   The patient is currently undergoing systemic chemotherapy with carboplatin for an AUC of 5, Alimta 500 mg per metered squared, Keytruda 20 mg IV every 3 weeks.  The patient is status post 10 cycles.  Starting from cycle #5, the patient started maintenance Alimta and Keytruda.   The patient was seen with Dr. [name] today.  Given the upper endoscopy report and pathology report the patient may have possible mild drug-induced injury to the stomach may be secondary to  his Keytruda use.  Dr. [name] will discontinue his Keytruda.  Due to the patient's poor performance status today, we will hold his treatment today and reschedule him for next week for single agent dose reduced Alimta at 400 mg per metered square.    In the meantime, we will arrange for the patient to have 2 units of blood.  His potassium is also low at 2.9.  We will arrange for the patient to get 20 mEq of potassium while in the clinic today.  I have also sent a prescription for potassium 20 mill equivalents p.o. daily to the patient's pharmacy.    We will see him back for follow-up visit in 1 week for evaluation before considering starting cycle #11 with single agent dose reduced Alimta.    He will follow-up with cardiology in the next few weeks to see if he needs to continue taking Plavix.  Recommend that he follow-up with GI if he continues to have worsening anemia.  The patient was advised to call immediately if he has any concerning symptoms in the interval. The patient voices understanding of current disease status and treatment options and is in agreement with the current care plan. All questions were answered. The patient knows to call the clinic with any  problems, questions or concerns. We can certainly see the patient much sooner if necessary   Orders Placed This Encounter  Procedures   TSH    Standing Status:   Standing    Number of Occurrences:   10    Standing Expiration Date:   06/13/2022   Care order/instruction    Transfuse Parameters    Standing Status:   Future    Standing Expiration Date:   06/13/2022   Informed Consent Details: Physician/Practitioner Attestation; Transcribe to consent form and obtain patient signature    Standing Status:   Future    Standing Expiration Date:   06/13/2022    Order Specific Question:   Physician/Practitioner attestation of informed consent for blood and or blood product transfusion    Answer:   I, the physician/practitioner, attest that I have discussed with the patient the benefits, risks, side effects, alternatives, likelihood of achieving goals and potential problems during recovery for the procedure that I have provided informed consent.    Order Specific Question:   Product(s)    Answer:   All Product(s)   Type and screen         Standing Status:   Future    Number of Occurrences:   1    Standing Expiration Date:   06/13/2022   Prepare RBC (crossmatch)    Standing Status:   Standing    Number of Occurrences:   1    Order Specific Question:   # of Units    Answer:   2 units    Order Specific Question:   Transfusion Indications    Answer:   Symptomatic Anemia    Order Specific Question:   Number of Units to Keep Ahead    Answer:   NO units ahead    Order Specific Question:   If emergent release call blood bank    Answer:   Not emergent release      Ormond Lazo L Gerene Nedd, PA-C 06/13/21  ADDENDUM: Hematology/Oncology Attending: I had a face-to-face encounter with the patient today.  I reviewed his record, lab and recommended his care plan.  This is a very pleasant 65 years old white male with metastatic non-small cell lung cancer that was  initially diagnosed as a stage IIIb  non-small cell lung cancer, adenocarcinoma status post right upper lobectomy with lymph node dissection under the care of [name] with no actionable mutations and PD-L1 expression of 20%.  He is status post 4 cycles of adjuvant systemic chemotherapy with cisplatin and Alimta.  The patient was on observation until February 2022 when he was found to have metastatic bone lesions in the left shoulder area and PET scan confirmed his disease recurrence and the left scapula as well as left supraclavicular lymph nodes that was biopsy-proven to be recurrent non-small cell lung cancer.  He received palliative radiotherapy to this area and started on systemic chemotherapy with carboplatin, Alimta and Keytruda initially for 4 cycles followed by maintenance treatment with Alimta and Keytruda for 6 more cycles.  The patient has been tolerating his treatment well but recently has significant decline in his condition with poor performance status as well as significant anemia requiring PRBCs transfusion. The patient was referred to gastroenterology and upper endoscopy showed inflammatory process in the stomach highly suspicious to be related to immunotherapy. The patient is also currently on Plavix for heart and peripheral vascular disease. He is here today for evaluation before starting cycle #11 of his treatment. He continues to have intermittent nausea and vomiting as well as fatigue. CBC today showed hemoglobin of 8.0. I recommended for the patient to delay his treatment until next week and we will resume with only single agent reduced dose Alimta 400 mg/M2.  We will hold his treatment with Keytruda for now because of the suspicious immunotherapy mediated gastritis. For the chemotherapy-induced anemia, will arrange for the patient to receive 1 unit of PRBCs transfusion today. The patient will come back for follow-up visit next week for evaluation before starting cycle #11. He was advised to call immediately if  he has any other concerning symptoms in the interval. The total time spent in the appointment was 35 minutes. Disclaimer: This note was dictated with voice recognition software. Similar sounding words can inadvertently be transcribed and may be missed upon review. Eilleen Kempf, MD 06/13/21

## 2021-06-13 ENCOUNTER — Inpatient Hospital Stay (HOSPITAL_BASED_OUTPATIENT_CLINIC_OR_DEPARTMENT_OTHER): Payer: BC Managed Care – PPO | Admitting: Physician Assistant

## 2021-06-13 ENCOUNTER — Inpatient Hospital Stay: Payer: BC Managed Care – PPO

## 2021-06-13 ENCOUNTER — Other Ambulatory Visit: Payer: Self-pay | Admitting: *Deleted

## 2021-06-13 ENCOUNTER — Inpatient Hospital Stay: Payer: BC Managed Care – PPO | Admitting: Dietician

## 2021-06-13 ENCOUNTER — Other Ambulatory Visit: Payer: Self-pay

## 2021-06-13 VITALS — BP 120/80 | HR 82 | Temp 97.9°F

## 2021-06-13 DIAGNOSIS — C3491 Malignant neoplasm of unspecified part of right bronchus or lung: Secondary | ICD-10-CM

## 2021-06-13 DIAGNOSIS — Z5111 Encounter for antineoplastic chemotherapy: Secondary | ICD-10-CM | POA: Diagnosis not present

## 2021-06-13 DIAGNOSIS — K1379 Other lesions of oral mucosa: Secondary | ICD-10-CM | POA: Diagnosis not present

## 2021-06-13 DIAGNOSIS — R110 Nausea: Secondary | ICD-10-CM | POA: Diagnosis not present

## 2021-06-13 DIAGNOSIS — E876 Hypokalemia: Secondary | ICD-10-CM

## 2021-06-13 DIAGNOSIS — B3781 Candidal esophagitis: Secondary | ICD-10-CM | POA: Diagnosis not present

## 2021-06-13 DIAGNOSIS — R531 Weakness: Secondary | ICD-10-CM | POA: Diagnosis not present

## 2021-06-13 DIAGNOSIS — C770 Secondary and unspecified malignant neoplasm of lymph nodes of head, face and neck: Secondary | ICD-10-CM | POA: Diagnosis not present

## 2021-06-13 DIAGNOSIS — C7951 Secondary malignant neoplasm of bone: Secondary | ICD-10-CM | POA: Diagnosis not present

## 2021-06-13 DIAGNOSIS — D649 Anemia, unspecified: Secondary | ICD-10-CM | POA: Diagnosis not present

## 2021-06-13 DIAGNOSIS — C3411 Malignant neoplasm of upper lobe, right bronchus or lung: Secondary | ICD-10-CM | POA: Diagnosis not present

## 2021-06-13 LAB — PREPARE RBC (CROSSMATCH)

## 2021-06-13 LAB — CBC WITH DIFFERENTIAL (CANCER CENTER ONLY)
Abs Immature Granulocytes: 0.01 10*3/uL (ref 0.00–0.07)
Basophils Absolute: 0 10*3/uL (ref 0.0–0.1)
Basophils Relative: 0 %
Eosinophils Absolute: 0.1 10*3/uL (ref 0.0–0.5)
Eosinophils Relative: 2 %
HCT: 22.3 % — ABNORMAL LOW (ref 39.0–52.0)
Hemoglobin: 8 g/dL — ABNORMAL LOW (ref 13.0–17.0)
Immature Granulocytes: 0 %
Lymphocytes Relative: 49 %
Lymphs Abs: 2.4 10*3/uL (ref 0.7–4.0)
MCH: 35.2 pg — ABNORMAL HIGH (ref 26.0–34.0)
MCHC: 35.9 g/dL (ref 30.0–36.0)
MCV: 98.2 fL (ref 80.0–100.0)
Monocytes Absolute: 1 10*3/uL (ref 0.1–1.0)
Monocytes Relative: 20 %
Neutro Abs: 1.4 10*3/uL — ABNORMAL LOW (ref 1.7–7.7)
Neutrophils Relative %: 29 %
Platelet Count: 204 10*3/uL (ref 150–400)
RBC: 2.27 MIL/uL — ABNORMAL LOW (ref 4.22–5.81)
RDW: 16 % — ABNORMAL HIGH (ref 11.5–15.5)
Smear Review: NORMAL
WBC Count: 4.9 10*3/uL (ref 4.0–10.5)
nRBC: 0 % (ref 0.0–0.2)

## 2021-06-13 LAB — CMP (CANCER CENTER ONLY)
ALT: 96 U/L — ABNORMAL HIGH (ref 0–44)
AST: 98 U/L — ABNORMAL HIGH (ref 15–41)
Albumin: 2.9 g/dL — ABNORMAL LOW (ref 3.5–5.0)
Alkaline Phosphatase: 166 U/L — ABNORMAL HIGH (ref 38–126)
Anion gap: 11 (ref 5–15)
BUN: 10 mg/dL (ref 8–23)
CO2: 24 mmol/L (ref 22–32)
Calcium: 8.5 mg/dL — ABNORMAL LOW (ref 8.9–10.3)
Chloride: 90 mmol/L — ABNORMAL LOW (ref 98–111)
Creatinine: 1.45 mg/dL — ABNORMAL HIGH (ref 0.61–1.24)
GFR, Estimated: 53 mL/min — ABNORMAL LOW (ref >60)
Glucose, Bld: 123 mg/dL — ABNORMAL HIGH (ref 70–99)
Potassium: 2.9 mmol/L — ABNORMAL LOW (ref 3.5–5.1)
Sodium: 125 mmol/L — ABNORMAL LOW (ref 135–145)
Total Bilirubin: 0.2 mg/dL — ABNORMAL LOW (ref 0.3–1.2)
Total Protein: 7 g/dL (ref 6.5–8.1)

## 2021-06-13 LAB — SAMPLE TO BLOOD BANK

## 2021-06-13 LAB — TSH: TSH: 2.356 u[IU]/mL (ref 0.320–4.118)

## 2021-06-13 MED ORDER — POTASSIUM CHLORIDE CRYS ER 20 MEQ PO TBCR: 20.0000 meq | EXTENDED_RELEASE_TABLET | Freq: Every day | ORAL | 0 refills | Status: DC

## 2021-06-13 MED ORDER — DIPHENHYDRAMINE HCL 25 MG PO CAPS
25.0000 mg | ORAL_CAPSULE | Freq: Once | ORAL | Status: AC
  Administered 2021-06-13: 25 mg via ORAL
  Filled 2021-06-13: qty 1

## 2021-06-13 MED ORDER — POTASSIUM CHLORIDE 10 MEQ/100ML IV SOLN
10.0000 meq | INTRAVENOUS | Status: AC
  Administered 2021-06-13 (×2): 10 meq via INTRAVENOUS
  Filled 2021-06-13 (×2): qty 100

## 2021-06-13 MED ORDER — POTASSIUM CHLORIDE 10 MEQ/100ML IV SOLN: 10.0000 meq | INTRAVENOUS | Status: DC

## 2021-06-13 MED ORDER — ACETAMINOPHEN 325 MG PO TABS
650.0000 mg | ORAL_TABLET | Freq: Once | ORAL | Status: AC
  Administered 2021-06-13: 650 mg via ORAL
  Filled 2021-06-13: qty 2

## 2021-06-13 MED ORDER — SODIUM CHLORIDE 0.9% IV SOLUTION
250.0000 mL | Freq: Once | INTRAVENOUS | Status: AC
  Administered 2021-06-13: 250 mL via INTRAVENOUS

## 2021-06-13 NOTE — Patient Instructions (Signed)
[facility]  Discharge Instructions: Thank you for choosing [facility] to provide your oncology and hematology care.   If you have a lab appointment with the [facility], please go directly to the [facility] and check in at the registration area.   Wear comfortable clothing and clothing appropriate for easy access to any Portacath or PICC line.   We strive to give you quality time with your provider. You may need to reschedule your appointment if you arrive late (15 or more minutes).  Arriving late affects you and other patients whose appointments are after yours.  Also, if you miss three or more appointments without notifying the office, you may be dismissed from the clinic at the provider's discretion.      For prescription refill requests, have your pharmacy contact our office and allow 72 hours for refills to be completed.    Today you received the following chemotherapy and/or immunotherapy agents Blood/ Potassium      To help prevent nausea and vomiting after your treatment, we encourage you to take your nausea medication as directed.  BELOW ARE SYMPTOMS THAT SHOULD BE REPORTED IMMEDIATELY: *FEVER GREATER THAN 100.4 F (38 C) OR HIGHER *CHILLS OR SWEATING *NAUSEA AND VOMITING THAT IS NOT CONTROLLED WITH YOUR NAUSEA MEDICATION *UNUSUAL SHORTNESS OF BREATH *UNUSUAL BRUISING OR BLEEDING *URINARY PROBLEMS (pain or burning when urinating, or frequent urination) *BOWEL PROBLEMS (unusual diarrhea, constipation, pain near the anus) TENDERNESS IN MOUTH AND THROAT WITH OR WITHOUT PRESENCE OF ULCERS (sore throat, sores in mouth, or a toothache) UNUSUAL RASH, SWELLING OR PAIN  UNUSUAL VAGINAL DISCHARGE OR ITCHING   Items with * indicate a potential emergency and should be followed up as soon as possible or go to the Emergency Department if any problems should occur.  Please show the CHEMOTHERAPY ALERT CARD or IMMUNOTHERAPY ALERT CARD at  check-in to the Emergency Department and triage nurse.  Should you have questions after your visit or need to cancel or reschedule your appointment, please contact [facility]  Dept: [phone]  and follow the prompts.  Office hours are 8:00 a.m. to 4:30 p.m. Monday - Friday. Please note that voicemails left after 4:00 p.m. may not be returned until the following business day.  We are closed weekends and major holidays. You have access to a nurse at all times for urgent questions. Please call the main number to the clinic Dept: [phone] and follow the prompts.   For any non-urgent questions, you may also contact your provider using MyChart. We now offer e-Visits for anyone 90 and older to request care online for non-urgent symptoms. For details visit [email or web address].   Also download the MyChart app! Go to the app store, search "MyChart", open the app, select [facility], and log in with your MyChart username and password.  Due to Covid, a mask is required upon entering the hospital/clinic. If you do not have a mask, one will be given to you upon arrival. For doctor visits, patients may have 1 support person aged 1 or older with them. For treatment visits, patients cannot have anyone with them due to current Covid guidelines and our immunocompromised population.

## 2021-06-13 NOTE — Progress Notes (Signed)
Nutrition Assessment   Reason for Assessment: MST    ASSESSMENT: 65 year old male with recurrent NSCLC. He is currently receiving carboplatin/alimta/keytruda. Alimta reduced to 400 mg/m2 due to worsening anemia requiring blood transfusions. Keytruda discontinued from cycle 11 due to possible checkpoint inhibitor toxicity of stomach. Patient is followed by Dr. [name].   Noted 1 unit PBRC on 11/5.  Met with patient during infusion. Patient receiving IV potassium during visit. He reports feeling very weak, patient reports unable to stand to have his weight taken and presyncopal episode in clinic today. Patient reports poor appetite over the past couple of months related to nausea, vomiting, diarrhea. Patient reports he has been eating a little better in the past few days. Patient reports he has been trying to focus on protein. He usually eats a bowl of oatmeal or eggs with toast in the morning. His wife is sometimes able to get him to eat a muffin or yogurt as a snack, then maybe a few bites of liver mush. Patient reports he is ready for bed at 6 PM. He was drinking chocolate flavor Ensure, but noticed he started to have indigestion and no longer drinks these. His wife was going to get him some Boost to try.    Nutrition Focused Physical Exam:  Severe fat depletion - buccal, orbital  Severe muscle depletion - temple, clavicle, dorsal hand   Medications: Klor-con, phenegran, zofran, folvite, MVI, Magnesium, Plavix   Labs: Na 125, K 2.9, Glucose 123, Cr 1.45   Anthropometrics: Weights have decreased 13.5 lbs (10%) in 2 months. Patient weighed 130 lb 8 oz on 9/6. He is 28 lbs (19.3%) under is reported usual weight. This is severe for time frame.   Height: 5'6" Weight: 117 lb (11/10) UBW: 145 lb (per pt) BMI: 18.88   Estimated Energy Needs: (30-35 kcal/kg/IBW 64.5 kg to promote weight gain)  Kcals: 5053-9767 Protein: 95-106 g Fluid: 2 L   NUTRITION DIAGNOSIS: Severe malnutrition in  the context of chronic illness (recurrent NSCLC) as evidenced by severe fat and muscle depletions, intake meeting <75% of estimated needs for >/= 1 month per dietary recall, severe 10% (13.5 lb) weight loss in 2 months   INTERVENTION:  Educated on importance of increased calorie and protein needs to maintain weights, strength, nutrition - handout provided Discussed strategies for increasing calories and protein with small frequent meals and snacks - handout with shake recipes provided Encouraged eating high calorie foods - recipes provided Recommend drinking 2-3 Ensure Enlive/equivalent daily as tolerated - coupons provided  Patient given vanilla Ensure Enlive to drink during infusion today Discussed alternate supplement options (CIB mixed with whole milk) - sample packet provided Contact information provided    MONITORING, EVALUATION, GOAL: Patient will tolerate increased calories and protein to promote weight gain   Next Visit: Wednesday December 7 during infusion

## 2021-06-14 ENCOUNTER — Ambulatory Visit (INDEPENDENT_AMBULATORY_CARE_PROVIDER_SITE_OTHER): Payer: BC Managed Care – PPO | Admitting: Pulmonary Disease

## 2021-06-14 ENCOUNTER — Encounter: Payer: Self-pay | Admitting: Pulmonary Disease

## 2021-06-14 VITALS — BP 110/74 | HR 75 | Temp 97.9°F | Ht 68.0 in | Wt 117.0 lb

## 2021-06-14 DIAGNOSIS — C3491 Malignant neoplasm of unspecified part of right bronchus or lung: Secondary | ICD-10-CM

## 2021-06-14 DIAGNOSIS — J449 Chronic obstructive pulmonary disease, unspecified: Secondary | ICD-10-CM

## 2021-06-14 DIAGNOSIS — Z23 Encounter for immunization: Secondary | ICD-10-CM | POA: Diagnosis not present

## 2021-06-14 LAB — TYPE AND SCREEN
ABO/RH(D): A POS
Antibody Screen: NEGATIVE
Unit division: 0
Unit division: 0

## 2021-06-14 LAB — BPAM RBC
Blood Product Expiration Date: 202212082359
Blood Product Expiration Date: 202212082359
ISSUE DATE / TIME: 202211161136
ISSUE DATE / TIME: 202211161136
Unit Type and Rh: 6200
Unit Type and Rh: 6200

## 2021-06-14 MED ORDER — TRELEGY ELLIPTA 100-62.5-25 MCG/ACT IN AEPB: 1.0000 | INHALATION_SPRAY | Freq: Every day | RESPIRATORY_TRACT | 0 refills | Status: DC

## 2021-06-14 MED ORDER — TRELEGY ELLIPTA 100-62.5-25 MCG/ACT IN AEPB: 1.0000 | INHALATION_SPRAY | Freq: Every day | RESPIRATORY_TRACT | 3 refills | Status: DC

## 2021-06-14 NOTE — Addendum Note (Signed)
Addended by: Fran Lowes on: 06/14/2021 10:35 AM   Modules accepted: Orders

## 2021-06-14 NOTE — Patient Instructions (Signed)
Thank you for visiting Dr. Valeta Harms at [facility]. Today we recommend the following:  Stop anoro Start Trelegy, samples today if we have them and new prescription  DME supply for albuterol nebulizer and neb solution   Return in about 6 months (around 12/12/2021) for w/ Dr. Valeta Harms .    Please do your part to reduce the spread of COVID-19.

## 2021-06-14 NOTE — Progress Notes (Signed)
Synopsis: Referred in December 2019 for RUL lung nodule by [name], NP  Subjective:   PATIENT ID: Sean Rose GENDER: male DOB: April 18, 1956, MRN: [id number]  Chief Complaint  Patient presents with   Follow-up    Patient needs refills on prescriptions    PMH tobacco abuse, COPD, GERD. Recently went to the ED after passing out. He had blood work and a CT chest. CT revealed RUL nodules and the patient was referred here for further. Works in an Marketing executive, manages a Teacher, English as a foreign language supplies. New boarder collie mix in the home. No exotic animals. No recent travel out of the country. Smoker since college, 40+ years, varies 1-1.5ppd. He has attempted to stop in the past. He has used patches, gum and chantix. He had horrible nightmares. He lost 10 to 12 pounds over the past month.  He also has noticed a significant decrease in his appetite.  Patient denies hemoptysis.  Of note he does take Plavix regularly.  He has had cardiac stents as well as femoral stents.  None within the past year.  OV 08/11/2018: pretty stressed out this morning patient's father was taken to [facility], ER this morning and intubated for respiratory failure.  Patient is here today for follow-up of his upper lobe lung nodules.  Since we were last seen he does have cardiology clearance for holding of his Plavix for any planned bronchoscopies.  He had pet imaging which revealed multiple upper lobe PET avid lesions and the reading radiologist was concerned for infectious inflammatory nodules over malignancy.  He has a planned super D image follow-up within the next 2 weeks.  We are here today to review those images and discussed the risk benefits and alternatives of proceeding with bronchoscopy.  Overall he still feels very tired and fatigued.  He has no hemoptysis.  He is trying to stop smoking.  He has been able to decrease this.  Overall the stress of this new information is making it difficult to  quit.  OV 06/14/2021: Here today for follow-up.  He has been under surveillance imaging with Dr. Earlie Server in medical oncology.  Unfortunately he had recurrence of disease with bony lesions despite surgery and adjuvant chemo.  He has received radiation treatments to the shoulder by Dr. Isidore Moos.  Follows with Dr. Earlie Server.  Overall doing okay.  He is a little bit depressed today about his situation but he is says that he wants to keep fighting.  He uses Anoro for the treatment of his COPD wondering if we should change his inhalers around because he still feels short of breath with exertion.   Past Medical History:  Diagnosis Date   Allergy    Anemia    Anginal pain ([place])    Blood transfusion without reported diagnosis    CAD (coronary artery disease)    CAP (community acquired pneumonia) 09/2016   COPD (chronic obstructive pulmonary disease) (HCC)    Emphysema of lung (HCC)    GERD (gastroesophageal reflux disease)    Heart murmur    "I was told I had one when I was a kid"   Hemorrhoids    History of anal fissures    "no surgeries" (10/30/2016)   Hyperlipidemia    Hypertension    lung ca dx'd 08/2018   with mets to bones in [place]   Myocardial infarction [facility])    Peripheral arterial disease ([place])    status post right common iliac artery stenting back in 2007  Seasonal allergies    Tobacco abuse      Family History  Problem Relation Age of Onset   Colon cancer Mother    Heart disease Father    Heart disease Paternal Grandfather    Rectal cancer Other    Esophageal cancer Neg Hx    Stomach cancer Neg Hx      Past Surgical History:  Procedure Laterality Date   ANKLE SURGERY Left    "rebuilt it"   ANTERIOR CRUCIATE LIGAMENT REPAIR Right    CARDIAC CATHETERIZATION  09/23/2008   Continued medical therapy - may need GI evaluation in addition.   CARDIAC CATHETERIZATION  10/28/2007   Medical therapy recommended.   CARDIAC CATHETERIZATION  11/18/2006   In-stent restenosis RCA  (50%  distal edge, 80% segmental mid, and 50-60% segmental proximal). Successful cutting balloon atherectomy using a [id number] cutting balloon. 3 inflations with atherectomy performed on mid and proximal portions resulting in reduction of 80% mid in-stent restenosis to less than 20% residual and 50-60% segmental proximal to less than 20% residual without dissection.   CARDIAC CATHETERIZATION  02/26/2006   Severe stenosis in RCA. Stenting performed using IVUS. 3.5x20 Maverick balloon deployed at [employer]. Distal stent-a 4x28 Liberte stent-deployed 12atm 48sec, 12atm 31sec, 4atm 19sec. Mid stent-a 4x28 Liberte stent-deployed 14atm 45sec, 14atm 60sec, 14atm 44sec. Proximal stent-4x8 Liberte- 14atm 45sec,14atm 47sec, 16atm 43sec. Severely diseased segment then appeared TIMI-3 flow.   CARDIOVASCULAR STRESS TEST  11/17/2012   No significant ECG changes. Septal perfusion defect is new when complared to study from 2010. Abnormal myocardial perfusion imaging with a basal to mid perfusion suggestive of previous MI.   CAROTID DOPPLER  08/09/2011   Bilateral Bulb/Proximal ICA - demonstrated a mild amount of fibrous plaque without evidence of significant diameter reduction reduction or other vascular abnormality.   CHEST TUBE INSERTION Right 09/02/2018   Procedure: Chest Tube Insertion;  Surgeon: Garner Nash, DO;  Location: [place];  Service: Thoracic;  Laterality: Right;   COLONOSCOPY     2003, 2014   CORONARY ANGIOPLASTY WITH STENT PLACEMENT     FEMORAL ARTERY STENT     INGUINAL HERNIA REPAIR Right    IR IMAGING GUIDED PORT INSERTION  11/13/2020   KNEE ARTHROSCOPY Right "multiple"   LAPAROSCOPIC APPENDECTOMY N/A 12/07/2018   Procedure: APPENDECTOMY LAPAROSCOPIC;  Surgeon: Coralie Keens, MD;  Location: [facility];  Service: General;  Laterality: N/A;   LOWER EXTREMITY ARTERIAL DOPPLER  01/31/2011   Bilateral ABIs-normal values with no suggestion of arterial insuff to the lower extremities at rest. Right CIA stent-mild  amount of nonhemodynamically significant plaque is noted throughout   [place]   "bone-eating tumor"   PERCUTANEOUS STENT INTERVENTION  04/04/2006 & 04/13/2015   a. Right common iliac artery with an 8.0x18 mm Herculink stent deployed at 12 atm. Stenosis was reduced from 80% to 0% with brisk flow. b. I-cast stenting to left common iliac artery   PERIPHERAL VASCULAR CATHETERIZATION N/A 04/13/2015   Procedure: Lower Extremity Angiography;  Surgeon: Lorretta Harp, MD; L-oCIA 75%, 40-50% L-EIA, R-CIA stent patent, s/p 8 mm x 38 mm ICast covered stent>>0% stenosis in [place] Right    TRANSTHORACIC ECHOCARDIOGRAM  11/26/2012   EF not noted. Aortic valve-sclerosis without stenosis, no regurgiation.    UPPER GASTROINTESTINAL ENDOSCOPY     US CAROTID DOPPLER BILATERAL ([place] [facility]X)  08/09/2011   Bilateral Bulb/Proximal ICAa demonstrated a mild amount of  fibrous plaque without evidence of significant diameter reduction or any other vascular abnormality.   VIDEO ASSISTED THORACOSCOPY (VATS)/ LOBECTOMY Right 09/14/2018   Procedure: RIGHT VIDEO ASSISTED THORACOSCOPY (VATS)/ RIGHT UPPER LOBECTOMY;  Surgeon: Melrose Nakayama, MD;  Location: [place];  Service: Thoracic;  Laterality: Right;   VIDEO BRONCHOSCOPY WITH ENDOBRONCHIAL NAVIGATION N/A 09/02/2018   Procedure: VIDEO BRONCHOSCOPY WITH ENDOBRONCHIAL NAVIGATION;  Surgeon: Garner Nash, DO;  Location: [facility];  Service: Thoracic;  Laterality: N/A;    Social History   Socioeconomic History   Marital status: Married    Spouse name: Not on file   Number of children: 2   Years of education: Not on file   Highest education level: Not on file  Occupational History   Occupation: retired  Tobacco Use   Smoking status: Former    Packs/day: 0.25    Years: 38.00    Pack years: 9.50    Types: Cigarettes    Quit date: 10/19/2018    Years since quitting: 2.6   Smokeless tobacco: Never   Tobacco  comments:    pack per day 12.17.19  Vaping Use   Vaping Use: Former  Substance and Sexual Activity   Alcohol use: Not Currently    Comment: socially   Drug use: No   Sexual activity: Yes  Other Topics Concern   Not on file  Social History Narrative   Patient is married with 2 children   Former smoker no tobacco now no drug use occasional/social alcohol   Social Determinants of Radio broadcast assistant Strain: Not on file  Food Insecurity: Not on file  Transportation Needs: Not on file  Physical Activity: Not on file  Stress: Not on file  Social Connections: Not on file  Intimate Partner Violence: Not on file     Allergies  Allergen Reactions   Compazine [Prochlorperazine] Other (See Comments)    " made him high and could not sleep". He does not want to take it again.     Outpatient Medications Prior to Visit  Medication Sig Dispense Refill   amLODipine (NORVASC) 5 MG tablet Take 1 tablet (5 mg total) by mouth daily. 180 tablet 3   aspirin EC 81 MG tablet Take 81 mg by mouth daily.     cetirizine (ZYRTEC) 10 MG tablet Take 10 mg by mouth at bedtime as needed for allergies.     clopidogrel (PLAVIX) 75 MG tablet TAKE 1 TABLET BY MOUTH EVERY DAY 90 tablet 3   esomeprazole (NEXIUM) 20 MG capsule Take 40 mg by mouth daily.     fluconazole (DIFLUCAN) 50 MG tablet Take 4 tablets day one and then 2 tablets daily until complete course 30 tablet 0   folic acid (FOLVITE) 1 MG tablet TAKE 1 TABLET(1 MG) BY MOUTH DAILY 30 tablet 4   lidocaine (XYLOCAINE) 2 % solution Use as directed 15 mLs in the mouth or throat as needed for mouth pain. 100 mL 0   lidocaine-prilocaine (EMLA) cream Apply to the Port-A-Cath site 30-60 minutes before treatment. 30 g 0   Magnesium 500 MG TABS Take 1 tablet (500 mg total) by mouth 3 (three) times daily. 8 tablet 0   metoprolol succinate (TOPROL XL) 25 MG 24 hr tablet Take 1 tablet (25 mg total) by mouth daily. 90 tablet 3   Multiple Vitamin  (MULTIVITAMIN WITH MINERALS) TABS tablet Take 1 tablet by mouth daily.     nitroGLYCERIN (NITROSTAT) 0.4 MG SL tablet PLACE 1 TABLET UNDER  THE TONGUE EVERY 5 MINUTES AS NEEDED FOR CHEST PAIN. 25 tablet 6   ondansetron (ZOFRAN) 8 MG tablet TAKE 1 TABLET(8 MG) BY MOUTH EVERY 8 HOURS AS NEEDED FOR NAUSEA OR VOMITING 20 tablet 0   potassium chloride SA (KLOR-CON) 20 MEQ tablet Take 1 tablet (20 mEq total) by mouth daily. 10 tablet 0   promethazine (PHENERGAN) 12.5 MG tablet Take 1 tablet (12.5 mg total) by mouth every 6 (six) hours as needed for nausea or vomiting. 30 tablet 0   umeclidinium-vilanterol (ANORO ELLIPTA) 62.5-25 MCG/INH AEPB Inhale 1 puff into the lungs daily. 60 each 5   rosuvastatin (CRESTOR) 5 MG tablet Take 1 tablet (5 mg total) by mouth every other day. 45 tablet 3   Facility-Administered Medications Prior to Visit  Medication Dose Route Frequency Provider Last Rate Last Admin   diphenhydrAMINE (BENADRYL) 25 mg capsule             Review of Systems  Constitutional:  Positive for malaise/fatigue and weight loss. Negative for chills and fever.  HENT:  Negative for hearing loss, sore throat and tinnitus.   Eyes:  Negative for blurred vision and double vision.  Respiratory:  Positive for shortness of breath. Negative for cough, hemoptysis, sputum production, wheezing and stridor.   Cardiovascular:  Negative for chest pain, palpitations, orthopnea, leg swelling and PND.  Gastrointestinal:  Negative for abdominal pain, constipation, diarrhea, heartburn, nausea and vomiting.  Genitourinary:  Negative for dysuria, hematuria and urgency.  Musculoskeletal:  Negative for joint pain and myalgias.  Skin:  Negative for itching and rash.  Neurological:  Negative for dizziness, tingling, weakness and headaches.  Endo/Heme/Allergies:  Negative for environmental allergies. Does not bruise/bleed easily.  Psychiatric/Behavioral:  Negative for depression. The patient is not nervous/anxious and  does not have insomnia.   All other systems reviewed and are negative.   Objective:  Physical Exam Vitals reviewed.  Constitutional:      General: He is not in acute distress.    Appearance: He is well-developed.     Comments: Thin, frail.  HENT:     Head: Normocephalic and atraumatic.  Eyes:     General: No scleral icterus.    Conjunctiva/sclera: Conjunctivae normal.     Pupils: Pupils are equal, round, and reactive to light.  Neck:     Vascular: No JVD.     Trachea: No tracheal deviation.  Cardiovascular:     Rate and Rhythm: Normal rate and regular rhythm.     Heart sounds: Normal heart sounds. No murmur heard. Pulmonary:     Effort: Pulmonary effort is normal. No tachypnea, accessory muscle usage or respiratory distress.     Breath sounds: No stridor. No wheezing, rhonchi or rales.  Abdominal:     General: Bowel sounds are normal. There is no distension.     Palpations: Abdomen is soft.     Tenderness: There is no abdominal tenderness.  Musculoskeletal:        General: No tenderness.     Cervical back: Neck supple.  Lymphadenopathy:     Cervical: No cervical adenopathy.  Skin:    General: Skin is warm and dry.     Capillary Refill: Capillary refill takes less than 2 seconds.     Findings: No rash.  Neurological:     Mental Status: He is alert and oriented to person, place, and time.  Psychiatric:        Behavior: Behavior normal.     Vitals:   06/14/21 0914  BP: 110/74  Pulse: 75  Temp: 97.9 F (36.6 C)  TempSrc: Oral  SpO2: 100%  Weight: 117 lb (53.1 kg)  Height: '5\' 8"'  (1.727 m)   100% on RA BMI Readings from Last 3 Encounters:  06/14/21 17.79 kg/m  06/01/21 18.88 kg/m  05/23/21 19.85 kg/m   Wt Readings from Last 3 Encounters:  06/14/21 117 lb (53.1 kg)  06/01/21 117 lb (53.1 kg)  05/23/21 123 lb (55.8 kg)     CBC    Component Value Date/Time   WBC 4.9 06/13/2021 0821   WBC 3.0 (L) 05/31/2021 1528   RBC 2.27 (L) 06/13/2021 0821   HGB  8.0 (L) 06/13/2021 0821   HCT 22.3 (L) 06/13/2021 0821   PLT 204 06/13/2021 0821   MCV 98.2 06/13/2021 0821   MCH 35.2 (H) 06/13/2021 0821   MCHC 35.9 06/13/2021 0821   RDW 16.0 (H) 06/13/2021 0821   LYMPHSABS 2.4 06/13/2021 0821   MONOABS 1.0 06/13/2021 0821   EOSABS 0.1 06/13/2021 0821   BASOSABS 0.0 06/13/2021 0821    Chest Imaging:  07/08/2018: Right upper lobe spiculated lesion, 1.1 x 1.1 x 1.4 cm, additional more posterior upper lobe against the major fissure 2 x 1 x 1 cm nodule with associated groundglass digit in all central advised upper lobe nodule as well.  There is multiple associated areas of significant centrilobular emphysema.  07/24/2018: Nuclear medicine pet imaging CT 3 upper lobe nodules all PET avid.  Reading radiologist concern for infectious over malignancy. The patient's images have been independently reviewed by me.    Pulmonary Functions Testing Results: PFT Results Latest Ref Rng & Units 07/15/2018  FVC-Pre L 3.27  FVC-Predicted Pre % 74  FVC-Post L 4.81  FVC-Predicted Post % 109  Pre FEV1/FVC % % 90  Post FEV1/FCV % % 62  FEV1-Pre L 2.95  FEV1-Predicted Pre % 89  FEV1-Post L 2.99  DLCO uncorrected ml/min/mmHg 18.46  DLCO UNC% % 62  DLVA Predicted % 59  TLC L 7.58  TLC % Predicted % 114  RV % Predicted % 149    FeNO: None   Pathology: None   Echocardiogram: None   Heart Catheterization: None     Assessment & Plan:   Stage 1 mild COPD by GOLD classification (HCC)  Need for immunization against influenza - Plan: Flu Vaccine QUAD High Dose(Fluad)  Adenocarcinoma of right lung, stage 4 (HCC)  Discussion:  This is a 65 year old gentleman, upper lobe pulmonary nodules on the right, had follow-up pet imaging with 3 areas of PET avid disease.  Underwent biopsy had a postop pneumothorax and air leak.  Received a lobectomy following that for resection of the 3 lesions.  Had adjuvant chemotherapy.  Under surveillance imaging with medical  oncology.  Unfortunately pack in the summer had a new bone lesion concerning for metastatic disease underwent chemotherapy and radiation to this lesion.  Plan: From management of his COPD standpoint I think we can start him with Trelegy Stop Anoro Continue albuterol as needed New prescription for DME supply for albuterol nebulizer and albuterol solution. Continue to follow-up with medical oncology. He is to let us know how he is doing with his new regimen. Can see [place] back in 6 months or if he needs to be seen sooner    Current Outpatient Medications:    amLODipine (NORVASC) 5 MG tablet, Take 1 tablet (5 mg total) by mouth daily., Disp: 180 tablet, Rfl: 3   aspirin EC 81 MG tablet, Take 81  mg by mouth daily., Disp: , Rfl:    cetirizine (ZYRTEC) 10 MG tablet, Take 10 mg by mouth at bedtime as needed for allergies., Disp: , Rfl:    clopidogrel (PLAVIX) 75 MG tablet, TAKE 1 TABLET BY MOUTH EVERY DAY, Disp: 90 tablet, Rfl: 3   esomeprazole (NEXIUM) 20 MG capsule, Take 40 mg by mouth daily., Disp: , Rfl:    fluconazole (DIFLUCAN) 50 MG tablet, Take 4 tablets day one and then 2 tablets daily until complete course, Disp: 30 tablet, Rfl: 0   folic acid (FOLVITE) 1 MG tablet, TAKE 1 TABLET(1 MG) BY MOUTH DAILY, Disp: 30 tablet, Rfl: 4   lidocaine (XYLOCAINE) 2 % solution, Use as directed 15 mLs in the mouth or throat as needed for mouth pain., Disp: 100 mL, Rfl: 0   lidocaine-prilocaine (EMLA) cream, Apply to the Port-A-Cath site 30-60 minutes before treatment., Disp: 30 g, Rfl: 0   Magnesium 500 MG TABS, Take 1 tablet (500 mg total) by mouth 3 (three) times daily., Disp: 8 tablet, Rfl: 0   metoprolol succinate (TOPROL XL) 25 MG 24 hr tablet, Take 1 tablet (25 mg total) by mouth daily., Disp: 90 tablet, Rfl: 3   Multiple Vitamin (MULTIVITAMIN WITH MINERALS) TABS tablet, Take 1 tablet by mouth daily., Disp: , Rfl:    nitroGLYCERIN (NITROSTAT) 0.4 MG SL tablet, PLACE 1 TABLET UNDER THE TONGUE EVERY 5  MINUTES AS NEEDED FOR CHEST PAIN., Disp: 25 tablet, Rfl: 6   ondansetron (ZOFRAN) 8 MG tablet, TAKE 1 TABLET(8 MG) BY MOUTH EVERY 8 HOURS AS NEEDED FOR NAUSEA OR VOMITING, Disp: 20 tablet, Rfl: 0   potassium chloride SA (KLOR-CON) 20 MEQ tablet, Take 1 tablet (20 mEq total) by mouth daily., Disp: 10 tablet, Rfl: 0   promethazine (PHENERGAN) 12.5 MG tablet, Take 1 tablet (12.5 mg total) by mouth every 6 (six) hours as needed for nausea or vomiting., Disp: 30 tablet, Rfl: 0   umeclidinium-vilanterol (ANORO ELLIPTA) 62.5-25 MCG/INH AEPB, Inhale 1 puff into the lungs daily., Disp: 60 each, Rfl: 5   rosuvastatin (CRESTOR) 5 MG tablet, Take 1 tablet (5 mg total) by mouth every other day., Disp: 45 tablet, Rfl: 3 No current facility-administered medications for this visit.  Facility-Administered Medications Ordered in Other Visits:    diphenhydrAMINE (BENADRYL) 25 mg capsule, , , ,    Garner Nash, DO  Pulmonary Critical Care 06/14/2021 9:30 AM

## 2021-06-14 NOTE — Addendum Note (Signed)
Addended by: Fran Lowes on: 06/14/2021 10:11 AM   Modules accepted: Orders

## 2021-06-15 NOTE — Progress Notes (Signed)
[name] OFFICE PROGRESS NOTE  Sean Peng, NP [place] [address]  DIAGNOSIS:  Metastatic non-small cell lung cancer initially diagnosed as stage IIIA (T3, N1, M0) non-small cell lung cancer, adenocarcinoma with no actionable mutations presented with multiple pulmonary nodules in the right upper lobe as well as metastatic disease in intraparenchymal lymphadenopathy.  This was diagnosed in February 2020.  The patient has disease recurrence in March 2022. PD-L1 expression 20%. He has no actionable mutations by foundation 1.  PRIOR THERAPY: 1) Status post right upper lobectomy with lymph node dissection. 2) Adjuvant systemic chemotherapy with cisplatin 75 mg/M2 and Alimta 500 mg/M2 every 3 weeks.  Status post 4 cycles. 3) palliative radiotherapy to the metastatic bone disease and the scapula as well as the left supraclavicular area under the care of Dr. Isidore Rose  CURRENT THERAPY: Systemic chemotherapy with carboplatin for AUC of 5, Alimta 500 mg/M2 and Keytruda 200 mg IV every 3 weeks.  First dose November 06, 2020.  Status post 10 cycles.  Starting from cycle #5, the patient will be treated with maintenance Keytruda and Alimta IV every 3 weeks. Dose reduced to Alimta 400 mg/m2 starting from cycle #11 because of worsening anemia.  Keytruda discontinued from cycle #11 due to possible checkpoint inhibitor toxicity of the stomach.   INTERVAL HISTORY: Sean Rose 65 y.o. male returns to the clinic today for a follow-up visit.  Over the last couple months/weeks, the patient has been having some trouble with intermittent nausea, vomiting, and diarrhea.  He is also been having progressively worsening anemia which requires blood transfusions.  His most recent blood transfusion was on 06/13/2021.  For his GI issues, the patient has seen Dr. Carlean Rose who performed an endoscopy which showed possible drug-induced inflammatory changes.  Presume this is from his checkpoint  inhibitor treatment with Keytruda.  The patient was last seen on 06/13/2021 and at that time it was determined that his Sean Rose would be discontinued and he will proceed on single agent chemotherapy with dose reduced Alimta 400 mg per metered squared.  However, the patient needed more time to recover from his symptoms and he was having some ongoing issues with weakness, fatigue, and lightheadedness.  When he was seen on 06/13/2021 he received 2 units of blood and IV potassium for hypokalemia.  He is feeling "100%" better at this time.  Since last week, he states he has more energy and is eating better. He is making sure to increase his protein and calories. He is drinking 2-3 ensures daily. He is eating meat and milkshakes.  He has been having some issues with weight loss but has been eating better more recently.  He was seen by member the nutritionist team at his last appointment. His weight is stable today.   The new symptoms in the interval is watery eyes, likely due to Alimta. He also has noticed midthoracic back pain if he is sitting up too long.  He thinks he got very weak and sedentary during his bouts of his GI issues so now when he sits up for long durations of time, he feels that his back muscles have gotten weak and are not is able to support him.  Denies any urinary symptoms or hematuria.  Denies any history of kidney stones.  His last scan in October 2022 does not show any evidence of osseous metastatic lesions.  The patient denies any falls, traumas, or injuries.  He has been taking Advil but is wondering  if he is allowed to take this.   He denies any fever, chills, or night sweats.  He has some worsening shortness of breath for which he saw his pulmonologist last week who prescribed nebulizer treatments as well as changes in inhalers.  He thinks his breathing is better compared to his last appointment and he has been coughing less. Denies any chest pain or hemoptysis.  He denies any nausea  since last week but has Compazine, Zofran, and Phenergan.  He denies any constipation or diarrhea.  He denies any headache or visual changes.  Denies any rashes or skin changes.  He is here today for evaluation and repeat blood work before considering starting cycle #11 with single agent Alimta.     MEDICAL HISTORY: Past Medical History:  Diagnosis Date   Allergy    Anemia    Anginal pain ([facility])    Blood transfusion without reported diagnosis    CAD (coronary artery disease)    CAP (community acquired pneumonia) 09/2016   COPD (chronic obstructive pulmonary disease) (HCC)    Emphysema of lung (HCC)    GERD (gastroesophageal reflux disease)    Heart murmur    "I was told I had one when I was a kid"   Hemorrhoids    History of anal fissures    "no surgeries" (10/30/2016)   Hyperlipidemia    Hypertension    lung ca dx'd 08/2018   with mets to bones in [facility]   Myocardial infarction [facility])    Peripheral arterial disease ([facility])    status post right common iliac artery stenting back in 2007   Seasonal allergies    Tobacco abuse     ALLERGIES:  is allergic to compazine [prochlorperazine].  MEDICATIONS:  Current Outpatient Medications  Medication Sig Dispense Refill   amLODipine (NORVASC) 5 MG tablet Take 1 tablet (5 mg total) by mouth daily. 180 tablet 3   aspirin EC 81 MG tablet Take 81 mg by mouth daily.     cetirizine (ZYRTEC) 10 MG tablet Take 10 mg by mouth at bedtime as needed for allergies.     clopidogrel (PLAVIX) 75 MG tablet TAKE 1 TABLET BY MOUTH EVERY DAY 90 tablet 3   esomeprazole (NEXIUM) 20 MG capsule Take 40 mg by mouth daily.     fluconazole (DIFLUCAN) 50 MG tablet Take 4 tablets day one and then 2 tablets daily until complete course 30 tablet 0   Fluticasone-Umeclidin-Vilant (TRELEGY ELLIPTA) 100-62.5-25 MCG/ACT AEPB Inhale 1 puff into the lungs daily. 60 each 3   folic acid (FOLVITE) 1 MG tablet TAKE 1 TABLET(1 MG) BY MOUTH DAILY 30 tablet 4   lidocaine (XYLOCAINE) 2  % solution Use as directed 15 mLs in the mouth or throat as needed for mouth pain. 100 mL 0   lidocaine-prilocaine (EMLA) cream Apply to the Port-A-Cath site 30-60 minutes before treatment. 30 g 0   Magnesium 500 MG TABS Take 1 tablet (500 mg total) by mouth 3 (three) times daily. 8 tablet 0   metoprolol succinate (TOPROL XL) 25 MG 24 hr tablet Take 1 tablet (25 mg total) by mouth daily. 90 tablet 3   Multiple Vitamin (MULTIVITAMIN WITH MINERALS) TABS tablet Take 1 tablet by mouth daily.     nitroGLYCERIN (NITROSTAT) 0.4 MG SL tablet PLACE 1 TABLET UNDER THE TONGUE EVERY 5 MINUTES AS NEEDED FOR CHEST PAIN. 25 tablet 6   ondansetron (ZOFRAN) 8 MG tablet TAKE 1 TABLET(8 MG) BY MOUTH EVERY 8 HOURS AS NEEDED FOR  NAUSEA OR VOMITING 20 tablet 0   potassium chloride SA (KLOR-CON) 20 MEQ tablet Take 1 tablet (20 mEq total) by mouth daily. 10 tablet 0   promethazine (PHENERGAN) 12.5 MG tablet Take 1 tablet (12.5 mg total) by mouth every 6 (six) hours as needed for nausea or vomiting. 30 tablet 0   umeclidinium-vilanterol (ANORO ELLIPTA) 62.5-25 MCG/INH AEPB Inhale 1 puff into the lungs daily. 60 each 5   rosuvastatin (CRESTOR) 5 MG tablet Take 1 tablet (5 mg total) by mouth every other day. 45 tablet 3   No current facility-administered medications for this visit.   Facility-Administered Medications Ordered in Other Visits  Medication Dose Route Frequency Provider Last Rate Last Admin   diphenhydrAMINE (BENADRYL) 25 mg capsule             SURGICAL HISTORY:  Past Surgical History:  Procedure Laterality Date   ANKLE SURGERY Left    "rebuilt it"   ANTERIOR CRUCIATE LIGAMENT REPAIR Right    CARDIAC CATHETERIZATION  09/23/2008   Continued medical therapy - may need GI evaluation in addition.   CARDIAC CATHETERIZATION  10/28/2007   Medical therapy recommended.   CARDIAC CATHETERIZATION  11/18/2006   In-stent restenosis RCA  (50% distal edge, 80% segmental mid, and 50-60% segmental proximal). Successful  cutting balloon atherectomy using a [id number] cutting balloon. 3 inflations with atherectomy performed on mid and proximal portions resulting in reduction of 80% mid in-stent restenosis to less than 20% residual and 50-60% segmental proximal to less than 20% residual without dissection.   CARDIAC CATHETERIZATION  02/26/2006   Severe stenosis in RCA. Stenting performed using IVUS. 3.5x20 Maverick balloon deployed at [employer]. Distal stent-a 4x28 Liberte stent-deployed 12atm 48sec, 12atm 31sec, 4atm 19sec. Mid stent-a 4x28 Liberte stent-deployed 14atm 45sec, 14atm 60sec, 14atm 44sec. Proximal stent-4x8 Liberte- 14atm 45sec,14atm 47sec, 16atm 43sec. Severely diseased segment then appeared TIMI-3 flow.   CARDIOVASCULAR STRESS TEST  11/17/2012   No significant ECG changes. Septal perfusion defect is new when complared to study from 2010. Abnormal myocardial perfusion imaging with a basal to mid perfusion suggestive of previous MI.   CAROTID DOPPLER  08/09/2011   Bilateral Bulb/Proximal ICA - demonstrated a mild amount of fibrous plaque without evidence of significant diameter reduction reduction or other vascular abnormality.   CHEST TUBE INSERTION Right 09/02/2018   Procedure: Chest Tube Insertion;  Surgeon: Garner Nash, DO;  Location: [facility];  Service: Thoracic;  Laterality: Right;   COLONOSCOPY     2003, 2014   CORONARY ANGIOPLASTY WITH STENT PLACEMENT     FEMORAL ARTERY STENT     INGUINAL HERNIA REPAIR Right    IR IMAGING GUIDED PORT INSERTION  11/13/2020   KNEE ARTHROSCOPY Right "multiple"   LAPAROSCOPIC APPENDECTOMY N/A 12/07/2018   Procedure: APPENDECTOMY LAPAROSCOPIC;  Surgeon: Coralie Keens, MD;  Location: [facility];  Service: General;  Laterality: N/A;   LOWER EXTREMITY ARTERIAL DOPPLER  01/31/2011   Bilateral ABIs-normal values with no suggestion of arterial insuff to the lower extremities at rest. Right CIA stent-mild amount of nonhemodynamically significant plaque is noted throughout   [place]   "bone-eating tumor"   PERCUTANEOUS STENT INTERVENTION  04/04/2006 & 04/13/2015   a. Right common iliac artery with an 8.0x18 mm Herculink stent deployed at 12 atm. Stenosis was reduced from 80% to 0% with brisk flow. b. I-cast stenting to left common iliac artery   PERIPHERAL VASCULAR CATHETERIZATION N/A 04/13/2015   Procedure: Lower Extremity Angiography;  Surgeon: Roderic Palau  Adora Fridge, MD; L-oCIA 75%, 40-50% L-EIA, R-CIA stent patent, s/p 8 mm x 38 mm ICast covered stent>>0% stenosis in L-oCIA      SHOULDER ARTHROSCOPY WITH ROTATOR CUFF REPAIR Right    TRANSTHORACIC ECHOCARDIOGRAM  11/26/2012   EF not noted. Aortic valve-sclerosis without stenosis, no regurgiation.    UPPER GASTROINTESTINAL ENDOSCOPY     US CAROTID DOPPLER BILATERAL ([facility])  08/09/2011   Bilateral Bulb/Proximal ICAa demonstrated a mild amount of fibrous plaque without evidence of significant diameter reduction or any other vascular abnormality.   VIDEO ASSISTED THORACOSCOPY (VATS)/ LOBECTOMY Right 09/14/2018   Procedure: RIGHT VIDEO ASSISTED THORACOSCOPY (VATS)/ RIGHT UPPER LOBECTOMY;  Surgeon: Melrose Nakayama, MD;  Location: [facility];  Service: Thoracic;  Laterality: Right;   VIDEO BRONCHOSCOPY WITH ENDOBRONCHIAL NAVIGATION N/A 09/02/2018   Procedure: VIDEO BRONCHOSCOPY WITH ENDOBRONCHIAL NAVIGATION;  Surgeon: Garner Nash, DO;  Location: [facility];  Service: Thoracic;  Laterality: N/A;    REVIEW OF SYSTEMS:   Review of Systems  Constitutional: Positive for improved fatigue and improved appetite compared to prior.  Negative for chills and fever and unexpected weight change.  HENT:   Negative for mouth sores, nosebleeds, sore throat and trouble swallowing.   Eyes: Positive for watery eyes bilaterally. Negative for eye problems and icterus.  Respiratory: Positive for improved but persistent dyspnea on exertion and improved but persistent cough.  Negative for hemoptysis and wheezing.   Cardiovascular: Negative for  chest pain and leg swelling.  Gastrointestinal: Negative for abdominal pain, constipation, diarrhea, nausea and vomiting.  Genitourinary: Negative for bladder incontinence, difficulty urinating, dysuria, frequency and hematuria.   Musculoskeletal: Positive for midthoracic back pain.  Negative for  gait problem, neck pain and neck stiffness.  Skin: Negative for itching and rash.  Neurological: Negative for dizziness, extremity weakness, gait problem, headaches, light-headedness and seizures.  Hematological: Negative for adenopathy. Does not bruise/bleed easily.  Psychiatric/Behavioral: Negative for confusion, depression and sleep disturbance. The patient is not nervous/anxious.     PHYSICAL EXAMINATION:  Blood pressure (!) 136/94, pulse 70, temperature (!) 97.2 F (36.2 C), temperature source Tympanic, resp. rate 18, weight 117 lb (53.1 kg), SpO2 100 %.  ECOG PERFORMANCE STATUS: 1-2  Physical Exam  Constitutional: Oriented to person, place, and time and thin appearing male. No distress.  HENT:  Head: Normocephalic and atraumatic.   Mouth/Throat: Oropharynx is clear and moist. No oropharyngeal exudate.  Eyes: Conjunctivae are normal. Right eye exhibits no discharge. Left eye exhibits no discharge. No scleral icterus.  Neck: Normal range of motion. Neck supple.  Cardiovascular: Normal rate, regular rhythm, normal heart sounds and intact distal pulses.   Pulmonary/Chest: Effort normal.  Quiet breath sounds bilaterally no respiratory distress. No wheezes. No rales.  Abdominal: Soft. Bowel sounds are normal. Exhibits no distension and no mass. There is no tenderness.  Musculoskeletal: Normal range of motion. Exhibits no edema.  Lymphadenopathy:    No cervical adenopathy.  Neurological: Alert and oriented to person, place, and time. Exhibits normal muscle tone. Gait normal. Coordination normal.  Skin: Skin is warm and dry. No rash noted. Not diaphoretic. No erythema. No pallor.  Psychiatric:  Mood, memory and judgment normal.  Vitals reviewed.  LABORATORY DATA: Lab Results  Component Value Date   WBC 6.4 06/20/2021   HGB 10.1 (L) 06/20/2021   HCT 28.4 (L) 06/20/2021   MCV 95.6 06/20/2021   PLT 345 06/20/2021      Chemistry      Component Value Date/Time  NA 127 (L) 06/20/2021 1055   NA 130 (L) 03/05/2017 1110   K 4.3 06/20/2021 1055   CL 94 (L) 06/20/2021 1055   CO2 27 06/20/2021 1055   BUN 16 06/20/2021 1055   BUN 4 (L) 03/05/2017 1110   CREATININE 1.29 (H) 06/20/2021 1055   CREATININE 0.76 04/28/2020 0826      Component Value Date/Time   CALCIUM 8.7 (L) 06/20/2021 1055   ALKPHOS 142 (H) 06/20/2021 1055   AST 47 (H) 06/20/2021 1055   ALT 37 06/20/2021 1055   BILITOT 0.4 06/20/2021 1055       RADIOGRAPHIC STUDIES:  No results found.   ASSESSMENT/PLAN:  This is a very pleasant 65 year old Caucasian male who was initially diagnosed with stage IIIB non-small cell lung cancer, adenocarcinoma. He is status post a right upper lobectomy with lymph node dissection under the care of [name].  He was negative for any actionable mutations.  He completed adjuvant chemotherapy with cisplatin and Alimta for 4 cycles.  He tolerated this fairly well.  His PD-L1 expression is 20%.    The patient had been on observation until imaging studies in February 2022 showed suspicious bone lesions in the left shoulder area.  A PET scan was performed on 09/25/2020 which showed metastatic disease involving the left scapula and left supraclavicular lymph nodes. The patient had a ultrasound-guided biopsy of the left supraclavicular lymph node and it was positive for metastatic carcinoma.   The patient then underwent palliative radiotherapy to the metastatic bone lesion in the left scapula under the care of Dr. Isidore Rose.  The patient is currently undergoing systemic chemotherapy with carboplatin for an AUC of 5, Alimta 500 mg per metered squared, Keytruda 20 mg IV every 3 weeks.   The patient is status post 10 cycles.  Starting from cycle #5, the patient started maintenance Alimta and Keytruda.  Alimta was reduced to 400 mg per metered squared starting from cycle #10.  Sean Rose was discontinued starting from cycle #11 due to possible checkpoint inhibitor toxicity in the stomach.  The patient was seen with Dr. [name] today.  Labs were reviewed.  Recommend that he proceed with cycle #11 today as scheduled with single agent Alimta at 400 mg per metered squared  We will see him back for follow-up visit in 3 weeks for evaluation before starting cycle #12.  We will arrange for weekly labs to ensure stability of his anemia.  Discussed that the watery eyes are likely secondary to Alimta.  He was encouraged to use Refresh Tears.  Regarding the back discomfort, the patient denies any traumas or injuries.  Denies any urinary symptoms or history of kidney stones.  The patient does not have any evidence of metastatic disease to the bones.  Encourage the patient to use Tylenol if needed.  Also encouraged him to use heating pads or Salonpas patches if needed. Discussed with the patient that we would likely arrange for restaging CT scan to reassess cancer at the end of December or early January.   Recommend a follow-up with GI if he continues to have worsening nausea, vomiting, diarrhea.  Will continue to be conscientious about his protein and calorie intake.  The patient was advised to call immediately if he has any concerning symptoms in the interval. The patient voices understanding of current disease status and treatment options and is in agreement with the current care plan. All questions were answered. The patient knows to call the clinic with any problems, questions or  concerns. We can certainly see the patient much sooner if necessary      Orders Placed This Encounter  Procedures   Sample to Blood Bank    Standing Status:   Standing    Number of Occurrences:   3     Standing Expiration Date:   06/20/2022      Tobe Sos Koben Daman, PA-C 06/20/21  ADDENDUM: Hematology/Oncology Attending: I had a face-to-face encounter with the patient today.  I reviewed his record, lab and recommended his care plan.  This is a very pleasant 65 years old white male with metastatic non-small cell lung cancer that was initially diagnosed as a stage IIIb non-small cell lung cancer, adenocarcinoma in February 2022 status post right upper lobectomy with lymph node dissection followed by adjuvant systemic chemotherapy with cisplatin and Alimta and the patient had evidence for disease recurrence in March 2022.  Repeat biopsy showed no actionable mutations and PD-L1 expression was 20%. He underwent palliative radiotherapy to the metastatic bone lesion in the left shoulder area and the patient is started systemic chemotherapy with carboplatin, Alimta and Keytruda for 4 cycles followed by maintenance treatment with Alimta and Keytruda for 6 more cycles. Unfortunately has been complaining of increasing fatigue and weakness as well as persistent nausea and vomiting as well as anemia.  He underwent upper endoscopy by Dr. Carlean Rose that showed inflammatory process in the stomach suspicious to be immunotherapy mediated inflammatory process. His treatment was delayed from last week because of the persistent anemia.  He is feeling much better today.  I recommended for the patient to resume his maintenance therapy but we will hold his treatment with [facility] for now and the patient will proceed with reduced dose Alimta 400 Mg/M2 every 3 weeks. The patient will come back for follow-up visit in 3 weeks for evaluation before starting cycle #12. He was advised to call immediately if he has any other concerning symptoms in the interval. Disclaimer: This note was dictated with voice recognition software. Similar sounding words can inadvertently be transcribed and may be missed upon review.  Eilleen Kempf, MD 06/20/21

## 2021-06-18 ENCOUNTER — Other Ambulatory Visit: Payer: Self-pay

## 2021-06-18 DIAGNOSIS — C3491 Malignant neoplasm of unspecified part of right bronchus or lung: Secondary | ICD-10-CM

## 2021-06-18 DIAGNOSIS — J441 Chronic obstructive pulmonary disease with (acute) exacerbation: Secondary | ICD-10-CM | POA: Diagnosis not present

## 2021-06-20 ENCOUNTER — Inpatient Hospital Stay: Payer: BC Managed Care – PPO

## 2021-06-20 ENCOUNTER — Other Ambulatory Visit: Payer: Self-pay

## 2021-06-20 ENCOUNTER — Encounter: Payer: Self-pay | Admitting: Physician Assistant

## 2021-06-20 ENCOUNTER — Inpatient Hospital Stay (HOSPITAL_BASED_OUTPATIENT_CLINIC_OR_DEPARTMENT_OTHER): Payer: BC Managed Care – PPO | Admitting: Physician Assistant

## 2021-06-20 VITALS — BP 136/94 | HR 70 | Temp 97.2°F | Resp 18 | Wt 117.0 lb

## 2021-06-20 DIAGNOSIS — K1379 Other lesions of oral mucosa: Secondary | ICD-10-CM | POA: Diagnosis not present

## 2021-06-20 DIAGNOSIS — Z5111 Encounter for antineoplastic chemotherapy: Secondary | ICD-10-CM

## 2021-06-20 DIAGNOSIS — C3491 Malignant neoplasm of unspecified part of right bronchus or lung: Secondary | ICD-10-CM | POA: Diagnosis not present

## 2021-06-20 DIAGNOSIS — C7951 Secondary malignant neoplasm of bone: Secondary | ICD-10-CM | POA: Diagnosis not present

## 2021-06-20 DIAGNOSIS — C3411 Malignant neoplasm of upper lobe, right bronchus or lung: Secondary | ICD-10-CM | POA: Diagnosis not present

## 2021-06-20 DIAGNOSIS — B3781 Candidal esophagitis: Secondary | ICD-10-CM | POA: Diagnosis not present

## 2021-06-20 DIAGNOSIS — D649 Anemia, unspecified: Secondary | ICD-10-CM | POA: Diagnosis not present

## 2021-06-20 DIAGNOSIS — Z95828 Presence of other vascular implants and grafts: Secondary | ICD-10-CM

## 2021-06-20 DIAGNOSIS — R110 Nausea: Secondary | ICD-10-CM | POA: Diagnosis not present

## 2021-06-20 DIAGNOSIS — E876 Hypokalemia: Secondary | ICD-10-CM

## 2021-06-20 DIAGNOSIS — R531 Weakness: Secondary | ICD-10-CM | POA: Diagnosis not present

## 2021-06-20 DIAGNOSIS — C770 Secondary and unspecified malignant neoplasm of lymph nodes of head, face and neck: Secondary | ICD-10-CM | POA: Diagnosis not present

## 2021-06-20 LAB — CBC WITH DIFFERENTIAL (CANCER CENTER ONLY)
Abs Immature Granulocytes: 0.02 10*3/uL (ref 0.00–0.07)
Basophils Absolute: 0 10*3/uL (ref 0.0–0.1)
Basophils Relative: 0 %
Eosinophils Absolute: 0.1 10*3/uL (ref 0.0–0.5)
Eosinophils Relative: 2 %
HCT: 28.4 % — ABNORMAL LOW (ref 39.0–52.0)
Hemoglobin: 10.1 g/dL — ABNORMAL LOW (ref 13.0–17.0)
Immature Granulocytes: 0 %
Lymphocytes Relative: 40 %
Lymphs Abs: 2.5 10*3/uL (ref 0.7–4.0)
MCH: 34 pg (ref 26.0–34.0)
MCHC: 35.6 g/dL (ref 30.0–36.0)
MCV: 95.6 fL (ref 80.0–100.0)
Monocytes Absolute: 1.3 10*3/uL — ABNORMAL HIGH (ref 0.1–1.0)
Monocytes Relative: 20 %
Neutro Abs: 2.4 10*3/uL (ref 1.7–7.7)
Neutrophils Relative %: 38 %
Platelet Count: 345 10*3/uL (ref 150–400)
RBC: 2.97 MIL/uL — ABNORMAL LOW (ref 4.22–5.81)
RDW: 18 % — ABNORMAL HIGH (ref 11.5–15.5)
WBC Count: 6.4 10*3/uL (ref 4.0–10.5)
nRBC: 0 % (ref 0.0–0.2)

## 2021-06-20 LAB — CMP (CANCER CENTER ONLY)
ALT: 37 U/L (ref 0–44)
AST: 47 U/L — ABNORMAL HIGH (ref 15–41)
Albumin: 3 g/dL — ABNORMAL LOW (ref 3.5–5.0)
Alkaline Phosphatase: 142 U/L — ABNORMAL HIGH (ref 38–126)
Anion gap: 6 (ref 5–15)
BUN: 16 mg/dL (ref 8–23)
CO2: 27 mmol/L (ref 22–32)
Calcium: 8.7 mg/dL — ABNORMAL LOW (ref 8.9–10.3)
Chloride: 94 mmol/L — ABNORMAL LOW (ref 98–111)
Creatinine: 1.29 mg/dL — ABNORMAL HIGH (ref 0.61–1.24)
GFR, Estimated: >60 mL/min (ref >60)
Glucose, Bld: 90 mg/dL (ref 70–99)
Potassium: 4.3 mmol/L (ref 3.5–5.1)
Sodium: 127 mmol/L — ABNORMAL LOW (ref 135–145)
Total Bilirubin: 0.4 mg/dL (ref 0.3–1.2)
Total Protein: 6.8 g/dL (ref 6.5–8.1)

## 2021-06-20 LAB — SAMPLE TO BLOOD BANK

## 2021-06-20 LAB — TSH: TSH: 1.711 u[IU]/mL (ref 0.320–4.118)

## 2021-06-20 MED ORDER — SODIUM CHLORIDE 0.9 % IV SOLN
Freq: Once | INTRAVENOUS | Status: AC
Start: 2021-06-20 — End: 2021-06-20

## 2021-06-20 MED ORDER — ONDANSETRON HCL 4 MG/2ML IJ SOLN
8.0000 mg | Freq: Once | INTRAMUSCULAR | Status: AC
  Administered 2021-06-20: 8 mg via INTRAVENOUS
  Filled 2021-06-20: qty 4

## 2021-06-20 MED ORDER — SODIUM CHLORIDE 0.9% FLUSH
10.0000 mL | Freq: Once | INTRAVENOUS | Status: AC
  Administered 2021-06-20: 10 mL

## 2021-06-20 MED ORDER — SODIUM CHLORIDE 0.9% FLUSH
10.0000 mL | INTRAVENOUS | Status: DC | PRN
  Administered 2021-06-20: 10 mL

## 2021-06-20 MED ORDER — HEPARIN SOD (PORK) LOCK FLUSH 100 UNIT/ML IV SOLN
500.0000 [IU] | Freq: Once | INTRAVENOUS | Status: AC | PRN
  Administered 2021-06-20: 500 [IU]

## 2021-06-20 MED ORDER — SODIUM CHLORIDE 0.9 % IV SOLN
400.0000 mg/m2 | Freq: Once | INTRAVENOUS | Status: AC
  Administered 2021-06-20: 600 mg via INTRAVENOUS
  Filled 2021-06-20: qty 20

## 2021-06-21 ENCOUNTER — Other Ambulatory Visit: Payer: Self-pay | Admitting: Physician Assistant

## 2021-06-21 DIAGNOSIS — C3491 Malignant neoplasm of unspecified part of right bronchus or lung: Secondary | ICD-10-CM

## 2021-06-21 DIAGNOSIS — E876 Hypokalemia: Secondary | ICD-10-CM

## 2021-06-22 ENCOUNTER — Other Ambulatory Visit: Payer: Self-pay

## 2021-06-25 ENCOUNTER — Telehealth: Payer: Self-pay | Admitting: Pulmonary Disease

## 2021-06-25 NOTE — Telephone Encounter (Signed)
ATC patient in regards to albuterol nebulizer solution, will try again later.

## 2021-06-27 ENCOUNTER — Other Ambulatory Visit: Payer: BC Managed Care – PPO

## 2021-06-27 ENCOUNTER — Ambulatory Visit: Payer: BC Managed Care – PPO | Admitting: Internal Medicine

## 2021-06-27 ENCOUNTER — Inpatient Hospital Stay: Payer: BC Managed Care – PPO

## 2021-06-27 ENCOUNTER — Other Ambulatory Visit: Payer: Self-pay

## 2021-06-27 ENCOUNTER — Ambulatory Visit: Payer: BC Managed Care – PPO

## 2021-06-27 DIAGNOSIS — C3411 Malignant neoplasm of upper lobe, right bronchus or lung: Secondary | ICD-10-CM | POA: Diagnosis not present

## 2021-06-27 DIAGNOSIS — C7951 Secondary malignant neoplasm of bone: Secondary | ICD-10-CM | POA: Diagnosis not present

## 2021-06-27 DIAGNOSIS — Z95828 Presence of other vascular implants and grafts: Secondary | ICD-10-CM

## 2021-06-27 DIAGNOSIS — D649 Anemia, unspecified: Secondary | ICD-10-CM | POA: Diagnosis not present

## 2021-06-27 DIAGNOSIS — B3781 Candidal esophagitis: Secondary | ICD-10-CM | POA: Diagnosis not present

## 2021-06-27 DIAGNOSIS — R110 Nausea: Secondary | ICD-10-CM | POA: Diagnosis not present

## 2021-06-27 DIAGNOSIS — C3491 Malignant neoplasm of unspecified part of right bronchus or lung: Secondary | ICD-10-CM

## 2021-06-27 DIAGNOSIS — Z5111 Encounter for antineoplastic chemotherapy: Secondary | ICD-10-CM | POA: Diagnosis not present

## 2021-06-27 DIAGNOSIS — C770 Secondary and unspecified malignant neoplasm of lymph nodes of head, face and neck: Secondary | ICD-10-CM | POA: Diagnosis not present

## 2021-06-27 DIAGNOSIS — K1379 Other lesions of oral mucosa: Secondary | ICD-10-CM | POA: Diagnosis not present

## 2021-06-27 DIAGNOSIS — R531 Weakness: Secondary | ICD-10-CM | POA: Diagnosis not present

## 2021-06-27 LAB — SAMPLE TO BLOOD BANK

## 2021-06-27 LAB — CBC WITH DIFFERENTIAL (CANCER CENTER ONLY)
Abs Immature Granulocytes: 0.03 10*3/uL (ref 0.00–0.07)
Basophils Absolute: 0 10*3/uL (ref 0.0–0.1)
Basophils Relative: 0 %
Eosinophils Absolute: 0 10*3/uL (ref 0.0–0.5)
Eosinophils Relative: 1 %
HCT: 26.4 % — ABNORMAL LOW (ref 39.0–52.0)
Hemoglobin: 9.3 g/dL — ABNORMAL LOW (ref 13.0–17.0)
Immature Granulocytes: 1 %
Lymphocytes Relative: 41 %
Lymphs Abs: 1.4 10*3/uL (ref 0.7–4.0)
MCH: 33.7 pg (ref 26.0–34.0)
MCHC: 35.2 g/dL (ref 30.0–36.0)
MCV: 95.7 fL (ref 80.0–100.0)
Monocytes Absolute: 0.2 10*3/uL (ref 0.1–1.0)
Monocytes Relative: 5 %
Neutro Abs: 1.9 10*3/uL (ref 1.7–7.7)
Neutrophils Relative %: 52 %
Platelet Count: 289 10*3/uL (ref 150–400)
RBC: 2.76 MIL/uL — ABNORMAL LOW (ref 4.22–5.81)
RDW: 18.1 % — ABNORMAL HIGH (ref 11.5–15.5)
WBC Count: 3.5 10*3/uL — ABNORMAL LOW (ref 4.0–10.5)
nRBC: 0 % (ref 0.0–0.2)

## 2021-06-27 LAB — CMP (CANCER CENTER ONLY)
ALT: 49 U/L — ABNORMAL HIGH (ref 0–44)
AST: 65 U/L — ABNORMAL HIGH (ref 15–41)
Albumin: 3.1 g/dL — ABNORMAL LOW (ref 3.5–5.0)
Alkaline Phosphatase: 126 U/L (ref 38–126)
Anion gap: 10 (ref 5–15)
BUN: 19 mg/dL (ref 8–23)
CO2: 24 mmol/L (ref 22–32)
Calcium: 8.7 mg/dL — ABNORMAL LOW (ref 8.9–10.3)
Chloride: 90 mmol/L — ABNORMAL LOW (ref 98–111)
Creatinine: 1.56 mg/dL — ABNORMAL HIGH (ref 0.61–1.24)
GFR, Estimated: 49 mL/min — ABNORMAL LOW (ref >60)
Glucose, Bld: 95 mg/dL (ref 70–99)
Potassium: 3.5 mmol/L (ref 3.5–5.1)
Sodium: 124 mmol/L — ABNORMAL LOW (ref 135–145)
Total Bilirubin: 1.2 mg/dL (ref 0.3–1.2)
Total Protein: 7.1 g/dL (ref 6.5–8.1)

## 2021-06-27 MED ORDER — SODIUM CHLORIDE 0.9% FLUSH
10.0000 mL | Freq: Once | INTRAVENOUS | Status: AC
  Administered 2021-06-27: 10 mL

## 2021-06-27 MED ORDER — HEPARIN SOD (PORK) LOCK FLUSH 100 UNIT/ML IV SOLN
500.0000 [IU] | Freq: Once | INTRAVENOUS | Status: AC
  Administered 2021-06-27: 500 [IU]

## 2021-06-29 ENCOUNTER — Ambulatory Visit (INDEPENDENT_AMBULATORY_CARE_PROVIDER_SITE_OTHER): Payer: BC Managed Care – PPO | Admitting: Family Medicine

## 2021-06-29 ENCOUNTER — Encounter: Payer: Self-pay | Admitting: Family Medicine

## 2021-06-29 VITALS — BP 118/76 | HR 79 | Temp 97.6°F | Ht 68.0 in | Wt 110.2 lb

## 2021-06-29 DIAGNOSIS — L309 Dermatitis, unspecified: Secondary | ICD-10-CM

## 2021-06-29 MED ORDER — TRIAMCINOLONE ACETONIDE 0.1 % EX CREA: 1.0000 "application " | TOPICAL_CREAM | Freq: Two times a day (BID) | CUTANEOUS | 1 refills | Status: DC

## 2021-06-29 NOTE — Progress Notes (Signed)
Established Patient Office Visit  Subjective:  Patient ID: Sean Rose, male    DOB: 1956-05-08  Age: 65 y.o. MRN: [id number]  CC:  Chief Complaint  Patient presents with   Rash    Patient complains of rash on lower extremities and left arm, x3 weeks, Patient tried Ucerin cream and body wash with little relief    HPI Sean Rose presents for skin rash lower extremities predominantly right lower extremity.  Present for about 3 weeks.  Only mild pruritus.  Has tried Eucerin without much relief.  He has metastatic non-small cell lung cancer which was diagnosed February 2020.  Evidence for disease recurrence March 2022.  He has been on multiple chemotherapy agents.  Recently had to stop Keytruda because of some GI issues with toxicity of the stomach.  Has recently been on Alimta and carboplatin.  Has some transient fever after his chemo but otherwise no fever.   Past Medical History:  Diagnosis Date   Allergy    Anemia    Anginal pain ([place])    Blood transfusion without reported diagnosis    CAD (coronary artery disease)    CAP (community acquired pneumonia) 09/2016   COPD (chronic obstructive pulmonary disease) (HCC)    Emphysema of lung (HCC)    GERD (gastroesophageal reflux disease)    Heart murmur    "I was told I had one when I was a kid"   Hemorrhoids    History of anal fissures    "no surgeries" (10/30/2016)   Hyperlipidemia    Hypertension    lung ca dx'd 08/2018   with mets to bones in arms   Myocardial infarction [facility])    Peripheral arterial disease ([place])    status post right common iliac artery stenting back in 2007   Seasonal allergies    Tobacco abuse     Past Surgical History:  Procedure Laterality Date   ANKLE SURGERY Left    "rebuilt it"   ANTERIOR CRUCIATE LIGAMENT REPAIR Right    CARDIAC CATHETERIZATION  09/23/2008   Continued medical therapy - may need GI evaluation in addition.   CARDIAC CATHETERIZATION  10/28/2007   Medical therapy recommended.    CARDIAC CATHETERIZATION  11/18/2006   In-stent restenosis RCA  (50% distal edge, 80% segmental mid, and 50-60% segmental proximal). Successful cutting balloon atherectomy using a [id number] cutting balloon. 3 inflations with atherectomy performed on mid and proximal portions resulting in reduction of 80% mid in-stent restenosis to less than 20% residual and 50-60% segmental proximal to less than 20% residual without dissection.   CARDIAC CATHETERIZATION  02/26/2006   Severe stenosis in RCA. Stenting performed using IVUS. 3.5x20 Maverick balloon deployed at [employer]. Distal stent-a 4x28 Liberte stent-deployed 12atm 48sec, 12atm 31sec, 4atm 19sec. Mid stent-a 4x28 Liberte stent-deployed 14atm 45sec, 14atm 60sec, 14atm 44sec. Proximal stent-4x8 Liberte- 14atm 45sec,14atm 47sec, 16atm 43sec. Severely diseased segment then appeared TIMI-3 flow.   CARDIOVASCULAR STRESS TEST  11/17/2012   No significant ECG changes. Septal perfusion defect is new when complared to study from 2010. Abnormal myocardial perfusion imaging with a basal to mid perfusion suggestive of previous MI.   CAROTID DOPPLER  08/09/2011   Bilateral Bulb/Proximal ICA - demonstrated a mild amount of fibrous plaque without evidence of significant diameter reduction reduction or other vascular abnormality.   CHEST TUBE INSERTION Right 09/02/2018   Procedure: Chest Tube Insertion;  Surgeon: Garner Nash, DO;  Location: [place];  Service: Thoracic;  Laterality: Right;  COLONOSCOPY     2003, 2014   CORONARY ANGIOPLASTY WITH STENT PLACEMENT     FEMORAL ARTERY STENT     INGUINAL HERNIA REPAIR Right    IR IMAGING GUIDED PORT INSERTION  11/13/2020   KNEE ARTHROSCOPY Right "multiple"   LAPAROSCOPIC APPENDECTOMY N/A 12/07/2018   Procedure: APPENDECTOMY LAPAROSCOPIC;  Surgeon: Coralie Keens, MD;  Location: [facility];  Service: General;  Laterality: N/A;   LOWER EXTREMITY ARTERIAL DOPPLER  01/31/2011   Bilateral ABIs-normal values with no suggestion of  arterial insuff to the lower extremities at rest. Right CIA stent-mild amount of nonhemodynamically significant plaque is noted throughout   [place]   "bone-eating tumor"   PERCUTANEOUS STENT INTERVENTION  04/04/2006 & 04/13/2015   a. Right common iliac artery with an 8.0x18 mm Herculink stent deployed at 12 atm. Stenosis was reduced from 80% to 0% with brisk flow. b. I-cast stenting to left common iliac artery   PERIPHERAL VASCULAR CATHETERIZATION N/A 04/13/2015   Procedure: Lower Extremity Angiography;  Surgeon: Lorretta Harp, MD; L-oCIA 75%, 40-50% L-EIA, R-CIA stent patent, s/p 8 mm x 38 mm ICast covered stent>>0% stenosis in [place] Right    TRANSTHORACIC ECHOCARDIOGRAM  11/26/2012   EF not noted. Aortic valve-sclerosis without stenosis, no regurgiation.    UPPER GASTROINTESTINAL ENDOSCOPY     US CAROTID DOPPLER BILATERAL ([place] [facility])  08/09/2011   Bilateral Bulb/Proximal ICAa demonstrated a mild amount of fibrous plaque without evidence of significant diameter reduction or any other vascular abnormality.   VIDEO ASSISTED THORACOSCOPY (VATS)/ LOBECTOMY Right 09/14/2018   Procedure: RIGHT VIDEO ASSISTED THORACOSCOPY (VATS)/ RIGHT UPPER LOBECTOMY;  Surgeon: Melrose Nakayama, MD;  Location: [place];  Service: Thoracic;  Laterality: Right;   VIDEO BRONCHOSCOPY WITH ENDOBRONCHIAL NAVIGATION N/A 09/02/2018   Procedure: VIDEO BRONCHOSCOPY WITH ENDOBRONCHIAL NAVIGATION;  Surgeon: Garner Nash, DO;  Location: [facility];  Service: Thoracic;  Laterality: N/A;    Family History  Problem Relation Age of Onset   Colon cancer Mother    Heart disease Father    Heart disease Paternal Grandfather    Rectal cancer Other    Esophageal cancer Neg Hx    Stomach cancer Neg Hx     Social History   Socioeconomic History   Marital status: Married    Spouse name: Not on file   Number of children: 2   Years of education: Not on file   Highest  education level: Not on file  Occupational History   Occupation: retired  Tobacco Use   Smoking status: Former    Packs/day: 0.25    Years: 38.00    Pack years: 9.50    Types: Cigarettes    Quit date: 10/19/2018    Years since quitting: 2.6   Smokeless tobacco: Never   Tobacco comments:    pack per day 12.17.19  Vaping Use   Vaping Use: Former  Substance and Sexual Activity   Alcohol use: Not Currently    Comment: socially   Drug use: No   Sexual activity: Yes  Other Topics Concern   Not on file  Social History Narrative   Patient is married with 2 children   Former smoker no tobacco now no drug use occasional/social alcohol   Social Determinants of Radio broadcast assistant Strain: Not on file  Food Insecurity: Not on file  Transportation Needs: Not on file  Physical Activity: Not on file  Stress: Not on file  Social Connections: Not on file  Intimate Partner Violence: Not on file    Outpatient Medications Prior to Visit  Medication Sig Dispense Refill   amLODipine (NORVASC) 5 MG tablet Take 1 tablet (5 mg total) by mouth daily. 180 tablet 3   aspirin EC 81 MG tablet Take 81 mg by mouth daily.     cetirizine (ZYRTEC) 10 MG tablet Take 10 mg by mouth at bedtime as needed for allergies.     clopidogrel (PLAVIX) 75 MG tablet TAKE 1 TABLET BY MOUTH EVERY DAY 90 tablet 3   esomeprazole (NEXIUM) 20 MG capsule Take 40 mg by mouth daily.     Fluticasone-Umeclidin-Vilant (TRELEGY ELLIPTA) 100-62.5-25 MCG/ACT AEPB Inhale 1 puff into the lungs daily. 60 each 3   folic acid (FOLVITE) 1 MG tablet TAKE 1 TABLET(1 MG) BY MOUTH DAILY 30 tablet 4   lidocaine (XYLOCAINE) 2 % solution Use as directed 15 mLs in the mouth or throat as needed for mouth pain. 100 mL 0   lidocaine-prilocaine (EMLA) cream Apply to the Port-A-Cath site 30-60 minutes before treatment. 30 g 0   Magnesium 500 MG TABS Take 1 tablet (500 mg total) by mouth 3 (three) times daily. 8 tablet 0   metoprolol succinate  (TOPROL XL) 25 MG 24 hr tablet Take 1 tablet (25 mg total) by mouth daily. 90 tablet 3   Multiple Vitamin (MULTIVITAMIN WITH MINERALS) TABS tablet Take 1 tablet by mouth daily.     nitroGLYCERIN (NITROSTAT) 0.4 MG SL tablet PLACE 1 TABLET UNDER THE TONGUE EVERY 5 MINUTES AS NEEDED FOR CHEST PAIN. 25 tablet 6   ondansetron (ZOFRAN) 8 MG tablet TAKE 1 TABLET(8 MG) BY MOUTH EVERY 8 HOURS AS NEEDED FOR NAUSEA OR VOMITING 20 tablet 0   potassium chloride SA (KLOR-CON) 20 MEQ tablet Take 1 tablet (20 mEq total) by mouth daily. 10 tablet 0   promethazine (PHENERGAN) 12.5 MG tablet Take 1 tablet (12.5 mg total) by mouth every 6 (six) hours as needed for nausea or vomiting. 30 tablet 0   umeclidinium-vilanterol (ANORO ELLIPTA) 62.5-25 MCG/INH AEPB Inhale 1 puff into the lungs daily. 60 each 5   rosuvastatin (CRESTOR) 5 MG tablet Take 1 tablet (5 mg total) by mouth every other day. 45 tablet 3   fluconazole (DIFLUCAN) 50 MG tablet Take 4 tablets day one and then 2 tablets daily until complete course 30 tablet 0   Facility-Administered Medications Prior to Visit  Medication Dose Route Frequency Provider Last Rate Last Admin   diphenhydrAMINE (BENADRYL) 25 mg capsule             Allergies  Allergen Reactions   Compazine [Prochlorperazine] Other (See Comments)    " made him high and could not sleep". He does not want to take it again.    ROS Review of Systems  Constitutional:  Positive for fatigue. Negative for fever.  Skin:  Positive for rash.  Hematological:  Negative for adenopathy.     Objective:    Physical Exam Vitals reviewed.  Cardiovascular:     Rate and Rhythm: Normal rate.  Skin:    Findings: Rash present.     Comments: He has some scattered areas of rash involving right lateral foot, right lateral leg, and right lateral thigh.  Erythematous blanching rash with slightly scaly surface.  No vesicles.  No pustules.    BP 118/76 (BP Location: Left Arm, Patient Position: Sitting,  Cuff Size: Normal)   Pulse 79  Temp 97.6 F (36.4 C) (Oral)   Ht '5\' 8"'  (1.727 m)   Wt 110 lb 3.2 oz (50 kg)   SpO2 96%   BMI 16.76 kg/m  Wt Readings from Last 3 Encounters:  06/29/21 110 lb 3.2 oz (50 kg)  06/20/21 117 lb (53.1 kg)  06/14/21 117 lb (53.1 kg)     Health Maintenance Due  Topic Date Due   Pneumonia Vaccine 11+ Years old (1 - PCV) Never done   Hepatitis C Screening  Never done   TETANUS/TDAP  Never done   Zoster Vaccines- Shingrix (1 of 2) Never done   COVID-19 Vaccine (3 - Pfizer risk series) 11/30/2019    There are no preventive care reminders to display for this patient.  Lab Results  Component Value Date   TSH 1.711 06/20/2021   Lab Results  Component Value Date   WBC 3.5 (L) 06/27/2021   HGB 9.3 (L) 06/27/2021   HCT 26.4 (L) 06/27/2021   MCV 95.7 06/27/2021   PLT 289 06/27/2021   Lab Results  Component Value Date   NA 124 (L) 06/27/2021   K 3.5 06/27/2021   CO2 24 06/27/2021   GLUCOSE 95 06/27/2021   BUN 19 06/27/2021   CREATININE 1.56 (H) 06/27/2021   BILITOT 1.2 06/27/2021   ALKPHOS 126 06/27/2021   AST 65 (H) 06/27/2021   ALT 49 (H) 06/27/2021   PROT 7.1 06/27/2021   ALBUMIN 3.1 (L) 06/27/2021   CALCIUM 8.7 (L) 06/27/2021   ANIONGAP 10 06/27/2021   GFR 49.80 (L) 05/31/2021   Lab Results  Component Value Date   CHOL 193 08/25/2020   Lab Results  Component Value Date   HDL 59 08/25/2020   Lab Results  Component Value Date   LDLCALC 105 (H) 08/25/2020   Lab Results  Component Value Date   TRIG 171 (H) 08/25/2020   Lab Results  Component Value Date   CHOLHDL 3.3 08/25/2020   No results found for: HGBA1C    Assessment & Plan:   Skin rash predominantly involving right lower extremity with lesser involvement of left lower extremity.  Dermatitis-like rash.  Etiology unclear.  Recommend trial of triamcinolone 0.1% cream to use twice daily. -Touch base by next week if not improving  Meds ordered this encounter   Medications   triamcinolone cream (KENALOG) 0.1 %    Sig: Apply 1 application topically 2 (two) times daily.    Dispense:  30 g    Refill:  1    Follow-up: No follow-ups on file.    Carolann Littler, MD

## 2021-06-30 ENCOUNTER — Other Ambulatory Visit: Payer: Self-pay | Admitting: Cardiovascular Disease

## 2021-07-04 ENCOUNTER — Other Ambulatory Visit: Payer: BC Managed Care – PPO

## 2021-07-04 ENCOUNTER — Ambulatory Visit: Payer: BC Managed Care – PPO | Admitting: Internal Medicine

## 2021-07-04 ENCOUNTER — Encounter: Payer: BC Managed Care – PPO | Admitting: Dietician

## 2021-07-04 ENCOUNTER — Ambulatory Visit: Payer: BC Managed Care – PPO

## 2021-07-09 NOTE — Progress Notes (Signed)
[facility] OFFICE PROGRESS NOTE  Sean Peng, NP [place] [address]  DIAGNOSIS: Metastatic non-small cell lung cancer initially diagnosed as stage IIIA (T3, N1, M0) non-small cell lung cancer, adenocarcinoma with no actionable mutations presented with multiple pulmonary nodules in the right upper lobe as well as metastatic disease in intraparenchymal lymphadenopathy.  This was diagnosed in February 2020.  The patient has disease recurrence in March 2022. PD-L1 expression 20%. He has no actionable mutations by foundation 1.  PRIOR THERAPY: 1) Status post right upper lobectomy with lymph node dissection. 2) Adjuvant systemic chemotherapy with cisplatin 75 mg/M2 and Alimta 500 mg/M2 every 3 weeks.  Status post 4 cycles. 3) palliative radiotherapy to the metastatic bone disease and the scapula as well as the left supraclavicular area under the care of Dr. Isidore Moos  CURRENT THERAPY: Systemic chemotherapy with carboplatin for AUC of 5, Alimta 500 mg/M2 and Keytruda 200 mg IV every 3 weeks.  First dose November 06, 2020.  Status post 11 cycles.  Starting from cycle #5, the patient will be treated with maintenance Keytruda and Alimta IV every 3 weeks. Dose reduced to Alimta 400 mg/m2 starting from cycle #11 because of worsening anemia.  Keytruda discontinued from cycle #11 due to possible checkpoint inhibitor toxicity of the stomach.  INTERVAL HISTORY: Sean Rose 65 y.o. male returns to the clinic today for a follow-up visit.  Over the last couple months/weeks, the patient has been having some trouble with intermittent nausea, vomiting, and diarrhea.  He is also been having progressively worsening anemia which requires blood transfusions.  His most recent blood transfusion was on 06/13/2021.  For his GI issues, the patient has seen Dr. Carlean Purl who performed an endoscopy which showed possible drug-induced inflammatory changes.  Presume this is from his checkpoint  inhibitor treatment with Keytruda. Therefore, his Beryle Flock was discontinued and he is presently on  single agent chemotherapy with dose reduced Alimta 400 mg per metered squared.   The patient did fairly well with his last cycle of treatment.  He does well a few days after chemotherapy but feels fatigue and decreased appetite the week following treatment.  However this past week he reports his appetite has improved and he has been sleeping better.  Since discontinuing Keytruda, his nausea, vomiting, diarrhea has improved.  His weight is stable.  He reports stable shortness of breath where he states he sometimes has "bouts of shortness of breath".  He saw pulmonology prior to last appointment who altered his inhalers and prescribed nebulizer treatment but he has not picked the nebulizer up yet.   Denies any chest pain or hemoptysis.  No nausea/vomiting. For nausea, the patient has Compazine, Zofran, and Phenergan if needed.  Denies any diarrhea or constipation.  Denies any headache or visual changes.   His last 2 appointments, he is reporting low back pain with certain positions.  He reports that as long as he sitting in a chair with good support, he does not have any back pain.  He reports increased back pain with holding himself upright.  He thinks it is more muscular pain as opposed to bone. The patient saw his PCP in the interval for development of her lower extremity rash of unclear etiology.  He was given a prescription for Kenalog cream which has resolved his symptoms.  The patient is here today for evaluation and repeat blood work before starting cycle #12.    MEDICAL HISTORY: Past Medical History:  Diagnosis Date  Allergy    Anemia    Anginal pain ([place] [facility])    Blood transfusion without reported diagnosis    CAD (coronary artery disease)    CAP (community acquired pneumonia) 09/2016   COPD (chronic obstructive pulmonary disease) (HCC)    Emphysema of lung (HCC)    GERD (gastroesophageal reflux  disease)    Heart murmur    "I was told I had one when I was a kid"   Hemorrhoids    History of anal fissures    "no surgeries" (10/30/2016)   Hyperlipidemia    Hypertension    lung ca dx'd 08/2018   with mets to bones in arms   Myocardial infarction [facility])    Peripheral arterial disease ([place])    status post right common iliac artery stenting back in 2007   Seasonal allergies    Tobacco abuse     ALLERGIES:  is allergic to compazine [prochlorperazine].  MEDICATIONS:  Current Outpatient Medications  Medication Sig Dispense Refill   amLODipine (NORVASC) 5 MG tablet Take 1 tablet (5 mg total) by mouth daily. 180 tablet 3   aspirin EC 81 MG tablet Take 81 mg by mouth daily.     cetirizine (ZYRTEC) 10 MG tablet Take 10 mg by mouth at bedtime as needed for allergies.     clopidogrel (PLAVIX) 75 MG tablet TAKE 1 TABLET BY MOUTH EVERY DAY 90 tablet 3   esomeprazole (NEXIUM) 20 MG capsule Take 40 mg by mouth daily.     Fluticasone-Umeclidin-Vilant (TRELEGY ELLIPTA) 100-62.5-25 MCG/ACT AEPB Inhale 1 puff into the lungs daily. 60 each 3   folic acid (FOLVITE) 1 MG tablet TAKE 1 TABLET(1 MG) BY MOUTH DAILY 30 tablet 4   lidocaine (XYLOCAINE) 2 % solution Use as directed 15 mLs in the mouth or throat as needed for mouth pain. 100 mL 0   lidocaine-prilocaine (EMLA) cream Apply to the Port-A-Cath site 30-60 minutes before treatment. 30 g 0   Magnesium 500 MG TABS Take 1 tablet (500 mg total) by mouth 3 (three) times daily. 8 tablet 0   metoprolol succinate (TOPROL XL) 25 MG 24 hr tablet Take 1 tablet (25 mg total) by mouth daily. 90 tablet 3   Multiple Vitamin (MULTIVITAMIN WITH MINERALS) TABS tablet Take 1 tablet by mouth daily.     nitroGLYCERIN (NITROSTAT) 0.4 MG SL tablet PLACE 1 TABLET UNDER THE TONGUE EVERY 5 MINUTES AS NEEDED FOR CHEST PAIN. 25 tablet 6   ondansetron (ZOFRAN) 8 MG tablet TAKE 1 TABLET(8 MG) BY MOUTH EVERY 8 HOURS AS NEEDED FOR NAUSEA OR VOMITING 20 tablet 0   potassium  chloride SA (KLOR-CON M) 20 MEQ tablet Take 1 tablet (20 mEq total) by mouth daily. 5 tablet 0   promethazine (PHENERGAN) 12.5 MG tablet Take 1 tablet (12.5 mg total) by mouth every 6 (six) hours as needed for nausea or vomiting. 30 tablet 0   rosuvastatin (CRESTOR) 5 MG tablet TAKE 1 TABLET(5 MG) BY MOUTH EVERY OTHER DAY 45 tablet 3   triamcinolone cream (KENALOG) 0.1 % Apply 1 application topically 2 (two) times daily. 30 g 1   umeclidinium-vilanterol (ANORO ELLIPTA) 62.5-25 MCG/INH AEPB Inhale 1 puff into the lungs daily. 60 each 5   No current facility-administered medications for this visit.   Facility-Administered Medications Ordered in Other Visits  Medication Dose Route Frequency Provider Last Rate Last Admin   diphenhydrAMINE (BENADRYL) 25 mg capsule             SURGICAL HISTORY:  Past Surgical History:  Procedure Laterality Date   ANKLE SURGERY Left    "rebuilt it"   ANTERIOR CRUCIATE LIGAMENT REPAIR Right    CARDIAC CATHETERIZATION  09/23/2008   Continued medical therapy - may need GI evaluation in addition.   CARDIAC CATHETERIZATION  10/28/2007   Medical therapy recommended.   CARDIAC CATHETERIZATION  11/18/2006   In-stent restenosis RCA  (50% distal edge, 80% segmental mid, and 50-60% segmental proximal). Successful cutting balloon atherectomy using a [id number] cutting balloon. 3 inflations with atherectomy performed on mid and proximal portions resulting in reduction of 80% mid in-stent restenosis to less than 20% residual and 50-60% segmental proximal to less than 20% residual without dissection.   CARDIAC CATHETERIZATION  02/26/2006   Severe stenosis in RCA. Stenting performed using IVUS. 3.5x20 Maverick balloon deployed at [employer]. Distal stent-a 4x28 Liberte stent-deployed 12atm 48sec, 12atm 31sec, 4atm 19sec. Mid stent-a 4x28 Liberte stent-deployed 14atm 45sec, 14atm 60sec, 14atm 44sec. Proximal stent-4x8 Liberte- 14atm 45sec,14atm 47sec, 16atm 43sec. Severely diseased segment  then appeared TIMI-3 flow.   CARDIOVASCULAR STRESS TEST  11/17/2012   No significant ECG changes. Septal perfusion defect is new when complared to study from 2010. Abnormal myocardial perfusion imaging with a basal to mid perfusion suggestive of previous MI.   CAROTID DOPPLER  08/09/2011   Bilateral Bulb/Proximal ICA - demonstrated a mild amount of fibrous plaque without evidence of significant diameter reduction reduction or other vascular abnormality.   CHEST TUBE INSERTION Right 09/02/2018   Procedure: Chest Tube Insertion;  Surgeon: Garner Nash, DO;  Location: [facility];  Service: Thoracic;  Laterality: Right;   COLONOSCOPY     2003, 2014   CORONARY ANGIOPLASTY WITH STENT PLACEMENT     FEMORAL ARTERY STENT     INGUINAL HERNIA REPAIR Right    IR IMAGING GUIDED PORT INSERTION  11/13/2020   KNEE ARTHROSCOPY Right "multiple"   LAPAROSCOPIC APPENDECTOMY N/A 12/07/2018   Procedure: APPENDECTOMY LAPAROSCOPIC;  Surgeon: Coralie Keens, MD;  Location: [facility];  Service: General;  Laterality: N/A;   LOWER EXTREMITY ARTERIAL DOPPLER  01/31/2011   Bilateral ABIs-normal values with no suggestion of arterial insuff to the lower extremities at rest. Right CIA stent-mild amount of nonhemodynamically significant plaque is noted throughout   [place]   "bone-eating tumor"   PERCUTANEOUS STENT INTERVENTION  04/04/2006 & 04/13/2015   a. Right common iliac artery with an 8.0x18 mm Herculink stent deployed at 12 atm. Stenosis was reduced from 80% to 0% with brisk flow. b. I-cast stenting to left common iliac artery   PERIPHERAL VASCULAR CATHETERIZATION N/A 04/13/2015   Procedure: Lower Extremity Angiography;  Surgeon: Lorretta Harp, MD; L-oCIA 75%, 40-50% L-EIA, R-CIA stent patent, s/p 8 mm x 38 mm ICast covered stent>>0% stenosis in [facility] Right    TRANSTHORACIC ECHOCARDIOGRAM  11/26/2012   EF not noted. Aortic valve-sclerosis without stenosis, no  regurgiation.    UPPER GASTROINTESTINAL ENDOSCOPY     US CAROTID DOPPLER BILATERAL ([place] HX)  08/09/2011   Bilateral Bulb/Proximal ICAa demonstrated a mild amount of fibrous plaque without evidence of significant diameter reduction or any other vascular abnormality.   VIDEO ASSISTED THORACOSCOPY (VATS)/ LOBECTOMY Right 09/14/2018   Procedure: RIGHT VIDEO ASSISTED THORACOSCOPY (VATS)/ RIGHT UPPER LOBECTOMY;  Surgeon: Melrose Nakayama, MD;  Location: [facility];  Service: Thoracic;  Laterality: Right;   VIDEO BRONCHOSCOPY WITH ENDOBRONCHIAL NAVIGATION N/A 09/02/2018   Procedure: VIDEO BRONCHOSCOPY  WITH ENDOBRONCHIAL NAVIGATION;  Surgeon: Garner Nash, DO;  Location: [facility];  Service: Thoracic;  Laterality: N/A;    REVIEW OF SYSTEMS:   Constitutional: Positive for improved fatigue and improved appetite compared to prior.  Negative for chills and fever and unexpected weight change.  HENT:   Negative for mouth sores, nosebleeds, sore throat and trouble swallowing.   Eyes: Positive for watery eyes bilaterally. Negative for eye problems and icterus.  Respiratory: Positive for stable persistent dyspnea on exertion and improved but persistent cough.  Negative for hemoptysis and wheezing.   Cardiovascular: Negative for chest pain and leg swelling.  Gastrointestinal: Negative for abdominal pain, constipation, diarrhea, nausea and vomiting.  Genitourinary: Negative for bladder incontinence, difficulty urinating, dysuria, frequency and hematuria.   Musculoskeletal: Positive for back pain.  Negative for gait problem, neck pain and neck stiffness.  Skin: Negative for itching and rash.  Neurological: Negative for dizziness, extremity weakness, gait problem, headaches, light-headedness and seizures.  Hematological: Negative for adenopathy. Does not bruise/bleed easily.  Psychiatric/Behavioral: Negative for confusion, depression and sleep disturbance. The patient is not nervous/anxious.     PHYSICAL  EXAMINATION:  Blood pressure 115/82, pulse 72, temperature 98 F (36.7 C), temperature source Temporal, resp. rate 16, height '5\' 8"'  (1.727 m), weight 109 lb 11.2 oz (49.8 kg), SpO2 100 %.  ECOG PERFORMANCE STATUS: 1-2  Physical Exam  Constitutional: Oriented to person, place, and time and thin appearing male. No distress.  HENT:  Head: Normocephalic and atraumatic.   Mouth/Throat: Oropharynx is clear and moist. No oropharyngeal exudate.  Eyes: Conjunctivae are normal. Right eye exhibits no discharge. Left eye exhibits no discharge. No scleral icterus.  Neck: Normal range of motion. Neck supple.  Cardiovascular: Normal rate, regular rhythm, normal heart sounds and intact distal pulses.   Pulmonary/Chest: Effort normal.  Quiet breath sounds bilaterally, rub noted in right lung. no respiratory distress. No wheezes. No rales.  Abdominal: Soft. Bowel sounds are normal. Exhibits no distension and no mass. There is no tenderness.  Musculoskeletal: Normal range of motion. Exhibits no edema.  Lymphadenopathy:    No cervical adenopathy.  Neurological: Alert and oriented to person, place, and time. Exhibits normal muscle tone. Gait normal. Coordination normal.  Skin: Skin is warm and dry. No rash noted. Not diaphoretic. No erythema. No pallor.  Psychiatric: Mood, memory and judgment normal.  Vitals reviewed.    LABORATORY DATA: Lab Results  Component Value Date   WBC 6.6 07/12/2021   HGB 8.5 (L) 07/12/2021   HCT 24.8 (L) 07/12/2021   MCV 95.0 07/12/2021   PLT 314 07/12/2021      Chemistry      Component Value Date/Time   NA 129 (L) 07/12/2021 0907   NA 130 (L) 03/05/2017 1110   K 3.2 (L) 07/12/2021 0907   CL 93 (L) 07/12/2021 0907   CO2 26 07/12/2021 0907   BUN 13 07/12/2021 0907   BUN 4 (L) 03/05/2017 1110   CREATININE 1.62 (H) 07/12/2021 0907   CREATININE 0.76 04/28/2020 0826      Component Value Date/Time   CALCIUM 8.6 (L) 07/12/2021 0907   ALKPHOS 111 07/12/2021 0907   AST  46 (H) 07/12/2021 0907   ALT 28 07/12/2021 0907   BILITOT 0.4 07/12/2021 0907       RADIOGRAPHIC STUDIES:  No results found.   ASSESSMENT/PLAN:  This is a very pleasant 65 year old Caucasian male who was initially diagnosed with stage IIIB non-small cell lung cancer, adenocarcinoma. He is  status post a right upper lobectomy with lymph node dissection under the care of [name].  He was negative for any actionable mutations.  He completed adjuvant chemotherapy with cisplatin and Alimta for 4 cycles.  He tolerated this fairly well.  His PD-L1 expression is 20%.   The patient had been on observation until imaging studies in February 2022 showed suspicious bone lesions in the left shoulder area.  A PET scan was performed on 09/25/2020 which showed metastatic disease involving the left scapula and left supraclavicular lymph nodes. The patient had a ultrasound-guided biopsy of the left supraclavicular lymph node and it was positive for metastatic carcinoma.   The patient then underwent palliative radiotherapy to the metastatic bone lesion in the left scapula under the care of Dr. Isidore Moos.  The patient is currently undergoing systemic chemotherapy with carboplatin for an AUC of 5, Alimta 500 mg per metered squared, Keytruda 20 mg IV every 3 weeks.  The patient is status post 11 cycles.  Starting from cycle #5, the patient started maintenance Alimta and Keytruda.  Alimta was reduced to 400 mg per metered squared starting from cycle #10.  Beryle Flock was discontinued starting from cycle #11 due to possible checkpoint inhibitor toxicity in the stomach.    Labs were reviewed. He has some renal insuffiencey at baseline. His creatinine is 1.62 today. His GFR is 47. Reviewed with Dr. [name]. Recommend that he proceed with cycle #12 today as scheduled with single agent Alimta at 400 mg per metered squared  We will see him back for follow-up visit in 3 weeks for evaluation before starting cycle #13.   We  will arrange for weekly labs to ensure stability of his anemia.  Discussed that the watery eyes are likely secondary to Alimta.  He was encouraged to use Refresh Tears.   I will arrange for a follow up CT scan of the chest, abdomen, and pelvis. I will order without contrast due to his renal insuffiencey.  We will evaluate his occasional back discomfort on upcoming CT scan.  His potassium is slightly low today.  I sent him potassium chloride to take 1 tablet 20 mill equivalents daily for 5 days.  The patient was advised to call immediately if she has any concerning symptoms in the interval. The patient voices understanding of current disease status and treatment options and is in agreement with the current care plan. All questions were answered. The patient knows to call the clinic with any problems, questions or concerns. We can certainly see the patient much sooner if necessary       Orders Placed This Encounter  Procedures   CT Chest Wo Contrast    Standing Status:   Future    Standing Expiration Date:   07/12/2022    Order Specific Question:   Preferred imaging location?    Answer:   [facility]   CT Abdomen Pelvis Wo Contrast    Standing Status:   Future    Standing Expiration Date:   07/12/2022    Order Specific Question:   Preferred imaging location?    Answer:   [facility]    Order Specific Question:   Is Oral Contrast requested for this exam?    Answer:   Yes, Per Radiology protocol   Sample to Blood Bank    Standing Status:   Standing    Number of Occurrences:   4    Standing Expiration Date:   07/12/2022     The total  time spent in the appointment was 20-29 minutes.  Maniah Nading L Lashondra Vaquerano, PA-C 07/12/21

## 2021-07-12 ENCOUNTER — Other Ambulatory Visit: Payer: Self-pay

## 2021-07-12 ENCOUNTER — Inpatient Hospital Stay: Payer: BC Managed Care – PPO | Attending: Internal Medicine

## 2021-07-12 ENCOUNTER — Inpatient Hospital Stay (HOSPITAL_BASED_OUTPATIENT_CLINIC_OR_DEPARTMENT_OTHER): Payer: BC Managed Care – PPO | Admitting: Physician Assistant

## 2021-07-12 ENCOUNTER — Inpatient Hospital Stay: Payer: BC Managed Care – PPO

## 2021-07-12 ENCOUNTER — Encounter: Payer: Self-pay | Admitting: Physician Assistant

## 2021-07-12 VITALS — BP 115/82 | HR 72 | Temp 98.0°F | Resp 16 | Ht 68.0 in | Wt 109.7 lb

## 2021-07-12 DIAGNOSIS — Z5111 Encounter for antineoplastic chemotherapy: Secondary | ICD-10-CM | POA: Diagnosis not present

## 2021-07-12 DIAGNOSIS — C7951 Secondary malignant neoplasm of bone: Secondary | ICD-10-CM | POA: Insufficient documentation

## 2021-07-12 DIAGNOSIS — Z95828 Presence of other vascular implants and grafts: Secondary | ICD-10-CM

## 2021-07-12 DIAGNOSIS — C3491 Malignant neoplasm of unspecified part of right bronchus or lung: Secondary | ICD-10-CM | POA: Diagnosis not present

## 2021-07-12 DIAGNOSIS — Z79899 Other long term (current) drug therapy: Secondary | ICD-10-CM | POA: Insufficient documentation

## 2021-07-12 DIAGNOSIS — E876 Hypokalemia: Secondary | ICD-10-CM

## 2021-07-12 DIAGNOSIS — C3411 Malignant neoplasm of upper lobe, right bronchus or lung: Secondary | ICD-10-CM | POA: Diagnosis not present

## 2021-07-12 LAB — CBC WITH DIFFERENTIAL (CANCER CENTER ONLY)
Abs Immature Granulocytes: 0.03 10*3/uL (ref 0.00–0.07)
Basophils Absolute: 0 10*3/uL (ref 0.0–0.1)
Basophils Relative: 0 %
Eosinophils Absolute: 0.1 10*3/uL (ref 0.0–0.5)
Eosinophils Relative: 1 %
HCT: 24.8 % — ABNORMAL LOW (ref 39.0–52.0)
Hemoglobin: 8.5 g/dL — ABNORMAL LOW (ref 13.0–17.0)
Immature Granulocytes: 1 %
Lymphocytes Relative: 29 %
Lymphs Abs: 1.9 10*3/uL (ref 0.7–4.0)
MCH: 32.6 pg (ref 26.0–34.0)
MCHC: 34.3 g/dL (ref 30.0–36.0)
MCV: 95 fL (ref 80.0–100.0)
Monocytes Absolute: 1.1 10*3/uL — ABNORMAL HIGH (ref 0.1–1.0)
Monocytes Relative: 16 %
Neutro Abs: 3.5 10*3/uL (ref 1.7–7.7)
Neutrophils Relative %: 53 %
Platelet Count: 314 10*3/uL (ref 150–400)
RBC: 2.61 MIL/uL — ABNORMAL LOW (ref 4.22–5.81)
RDW: 18.4 % — ABNORMAL HIGH (ref 11.5–15.5)
WBC Count: 6.6 10*3/uL (ref 4.0–10.5)
nRBC: 0 % (ref 0.0–0.2)

## 2021-07-12 LAB — CMP (CANCER CENTER ONLY)
ALT: 28 U/L (ref 0–44)
AST: 46 U/L — ABNORMAL HIGH (ref 15–41)
Albumin: 2.9 g/dL — ABNORMAL LOW (ref 3.5–5.0)
Alkaline Phosphatase: 111 U/L (ref 38–126)
Anion gap: 10 (ref 5–15)
BUN: 13 mg/dL (ref 8–23)
CO2: 26 mmol/L (ref 22–32)
Calcium: 8.6 mg/dL — ABNORMAL LOW (ref 8.9–10.3)
Chloride: 93 mmol/L — ABNORMAL LOW (ref 98–111)
Creatinine: 1.62 mg/dL — ABNORMAL HIGH (ref 0.61–1.24)
GFR, Estimated: 47 mL/min — ABNORMAL LOW (ref >60)
Glucose, Bld: 99 mg/dL (ref 70–99)
Potassium: 3.2 mmol/L — ABNORMAL LOW (ref 3.5–5.1)
Sodium: 129 mmol/L — ABNORMAL LOW (ref 135–145)
Total Bilirubin: 0.4 mg/dL (ref 0.3–1.2)
Total Protein: 6.9 g/dL (ref 6.5–8.1)

## 2021-07-12 LAB — SAMPLE TO BLOOD BANK

## 2021-07-12 MED ORDER — POTASSIUM CHLORIDE CRYS ER 20 MEQ PO TBCR: 20.0000 meq | EXTENDED_RELEASE_TABLET | Freq: Every day | ORAL | 0 refills | Status: DC

## 2021-07-12 MED ORDER — SODIUM CHLORIDE 0.9 % IV SOLN
400.0000 mg/m2 | Freq: Once | INTRAVENOUS | Status: AC
  Administered 2021-07-12: 600 mg via INTRAVENOUS
  Filled 2021-07-12: qty 20

## 2021-07-12 MED ORDER — SODIUM CHLORIDE 0.9% FLUSH
10.0000 mL | Freq: Once | INTRAVENOUS | Status: AC
  Administered 2021-07-12: 10 mL

## 2021-07-12 MED ORDER — SODIUM CHLORIDE 0.9 % IV SOLN: Freq: Once | INTRAVENOUS | Status: AC

## 2021-07-12 MED ORDER — SODIUM CHLORIDE 0.9% FLUSH
10.0000 mL | INTRAVENOUS | Status: DC | PRN
  Administered 2021-07-12: 10 mL

## 2021-07-12 MED ORDER — HEPARIN SOD (PORK) LOCK FLUSH 100 UNIT/ML IV SOLN
500.0000 [IU] | Freq: Once | INTRAVENOUS | Status: AC | PRN
  Administered 2021-07-12: 500 [IU]

## 2021-07-12 MED ORDER — ONDANSETRON HCL 4 MG/2ML IJ SOLN
8.0000 mg | Freq: Once | INTRAMUSCULAR | Status: AC
  Administered 2021-07-12: 8 mg via INTRAVENOUS
  Filled 2021-07-12: qty 4

## 2021-07-12 MED ORDER — CYANOCOBALAMIN 1000 MCG/ML IJ SOLN
1000.0000 ug | Freq: Once | INTRAMUSCULAR | Status: AC
  Administered 2021-07-12: 1000 ug via INTRAMUSCULAR
  Filled 2021-07-12: qty 1

## 2021-07-12 NOTE — Patient Instructions (Signed)
[facility]  Discharge Instructions: Thank you for choosing [facility] to provide your oncology and hematology care.   If you have a lab appointment with the [facility], please go directly to the [facility] and check in at the registration area.   Wear comfortable clothing and clothing appropriate for easy access to any Portacath or PICC line.   We strive to give you quality time with your provider. You may need to reschedule your appointment if you arrive late (15 or more minutes).  Arriving late affects you and other patients whose appointments are after yours.  Also, if you miss three or more appointments without notifying the office, you may be dismissed from the clinic at the providers discretion.      For prescription refill requests, have your pharmacy contact our office and allow 72 hours for refills to be completed.    Today you received the following chemotherapy and/or immunotherapy agents: Alimta   To help prevent nausea and vomiting after your treatment, we encourage you to take your nausea medication as directed.  BELOW ARE SYMPTOMS THAT SHOULD BE REPORTED IMMEDIATELY: *FEVER GREATER THAN 100.4 F (38 C) OR HIGHER *CHILLS OR SWEATING *NAUSEA AND VOMITING THAT IS NOT CONTROLLED WITH YOUR NAUSEA MEDICATION *UNUSUAL SHORTNESS OF BREATH *UNUSUAL BRUISING OR BLEEDING *URINARY PROBLEMS (pain or burning when urinating, or frequent urination) *BOWEL PROBLEMS (unusual diarrhea, constipation, pain near the anus) TENDERNESS IN MOUTH AND THROAT WITH OR WITHOUT PRESENCE OF ULCERS (sore throat, sores in mouth, or a toothache) UNUSUAL RASH, SWELLING OR PAIN  UNUSUAL VAGINAL DISCHARGE OR ITCHING   Items with * indicate a potential emergency and should be followed up as soon as possible or go to the Emergency Department if any problems should occur.  Please show the CHEMOTHERAPY ALERT CARD or IMMUNOTHERAPY ALERT CARD at check-in to the  Emergency Department and triage nurse.  Should you have questions after your visit or need to cancel or reschedule your appointment, please contact [facility]  Dept: [phone]  and follow the prompts.  Office hours are 8:00 a.m. to 4:30 p.m. Monday - Friday. Please note that voicemails left after 4:00 p.m. may not be returned until the following business day.  We are closed weekends and major holidays. You have access to a nurse at all times for urgent questions. Please call the main number to the clinic Dept: [phone] and follow the prompts.   For any non-urgent questions, you may also contact your provider using MyChart. We now offer e-Visits for anyone 91 and older to request care online for non-urgent symptoms. For details visit [email or web address].   Also download the MyChart app! Go to the app store, search "MyChart", open the app, select [facility], and log in with your MyChart username and password.  Due to Covid, a mask is required upon entering the hospital/clinic. If you do not have a mask, one will be given to you upon arrival. For doctor visits, patients may have 1 support person aged 50 or older with them. For treatment visits, patients cannot have anyone with them due to current Covid guidelines and our immunocompromised population.   Vitamin B12 Injection What is this medication? Vitamin B12 (VAHY tuh min B12) prevents and treats low vitamin B12 levels in your body. It is used in people who do not get enough vitamin B12 from their diet or when their digestive tract does not absorb enough. Vitamin B12 plays an important  role in maintaining the health of your nervous system and red blood cells. This medicine may be used for other purposes; ask your health care provider or pharmacist if you have questions. COMMON BRAND NAME(S): B-12 Compliance Kit, B-12 Injection Kit, Cyomin, Dodex, LA-12, Nutri-Twelve, Physicians EZ Use B-12, Primabalt What  should I tell my care team before I take this medication? They need to know if you have any of these conditions: Kidney disease Leber's disease Megaloblastic anemia An unusual or allergic reaction to cyanocobalamin, cobalt, other medications, foods, dyes, or preservatives Pregnant or trying to get pregnant Breast-feeding How should I use this medication? This medication is injected into a muscle or deeply under the skin. It is usually given in a clinic or care team's office. However, your care team may teach you how to inject yourself. Follow all instructions. Talk to your care team about the use of this medication in children. Special care may be needed. Overdosage: If you think you have taken too much of this medicine contact a poison control center or emergency room at once. NOTE: This medicine is only for you. Do not share this medicine with others. What if I miss a dose? If you are given your dose at a clinic or care team's office, call to reschedule your appointment. If you give your own injections, and you miss a dose, take it as soon as you can. If it is almost time for your next dose, take only that dose. Do not take double or extra doses. What may interact with this medication? Colchicine Heavy alcohol intake This list may not describe all possible interactions. Give your health care provider a list of all the medicines, herbs, non-prescription drugs, or dietary supplements you use. Also tell them if you smoke, drink alcohol, or use illegal drugs. Some items may interact with your medicine. What should I watch for while using this medication? Visit your care team regularly. You may need blood work done while you are taking this medication. You may need to follow a special diet. Talk to your care team. Limit your alcohol intake and avoid smoking to get the best benefit. What side effects may I notice from receiving this medication? Side effects that you should report to your care team  as soon as possible: Allergic reactions--skin rash, itching, hives, swelling of the face, lips, tongue, or throat Swelling of the ankles, hands, or feet Trouble breathing Side effects that usually do not require medical attention (report to your care team if they continue or are bothersome): Diarrhea This list may not describe all possible side effects. Call your doctor for medical advice about side effects. You may report side effects to FDA at 1-800-FDA-1088. Where should I keep my medication? Keep out of the reach of children. Store at room temperature between 15 and 30 degrees C (59 and 85 degrees F). Protect from light. Throw away any unused medication after the expiration date. NOTE: This sheet is a summary. It may not cover all possible information. If you have questions about this medicine, talk to your doctor, pharmacist, or health care provider.  2022 Elsevier/Gold Standard (2020-09-27 00:00:00)  Rehydration, Adult Rehydration is the replacement of body fluids, salts, and minerals (electrolytes) that are lost during dehydration. Dehydration is when there is not enough water or other fluids in the body. This happens when you lose more fluids than you take in. Common causes of dehydration include: Not drinking enough fluids. This can occur when you are ill or doing activities  that require a lot of energy, especially in hot weather. Conditions that cause loss of water or other fluids, such as diarrhea, vomiting, sweating, or urinating a lot. Other illnesses, such as fever or infection. Certain medicines, such as those that remove excess fluid from the body (diuretics). Symptoms of mild or moderate dehydration may include thirst, dry lips and mouth, and dizziness. Symptoms of severe dehydration may include increased heart rate, confusion, fainting, and not urinating. For severe dehydration, you may need to get fluids through an IV at the hospital. For mild or moderate dehydration, you can  usually rehydrate at home by drinking certain fluids as told by your health care provider. What are the risks? Generally, rehydration is safe. However, taking in too much fluid (overhydration) can be a problem. This is rare. Overhydration can cause an electrolyte imbalance, kidney failure, or a decrease in salt (sodium) levels in the body. Supplies needed You will need an oral rehydration solution (ORS) if your health care provider tells you to use one. This is a drink to treat dehydration. It can be found in pharmacies and retail stores. How to rehydrate Fluids Follow instructions from your health care provider for rehydration. The kind of fluid and the amount you should drink depend on your condition. In general, you should choose drinks that you prefer. If told by your health care provider, drink an ORS. Make an ORS by following instructions on the package. Start by drinking small amounts, about  cup (120 mL) every 5-10 minutes. Slowly increase how much you drink until you have taken the amount recommended by your health care provider. Drink enough clear fluids to keep your urine pale yellow. If you were told to drink an ORS, finish it first, then start slowly drinking other clear fluids. Drink fluids such as: Water. This includes sparkling water and flavored water. Drinking only water can lead to having too little sodium in your body (hyponatremia). Follow the advice of your health care provider. Water from ice chips you suck on. Fruit juice with water you add to it (diluted). Sports drinks. Hot or cold herbal teas. Broth-based soups. Milk or milk products. Food Follow instructions from your health care provider about what to eat while you rehydrate. Your health care provider may recommend that you slowly begin eating regular foods in small amounts. Eat foods that contain a healthy balance of electrolytes, such as bananas, oranges, potatoes, tomatoes, and spinach. Avoid foods that are  greasy or contain a lot of sugar. In some cases, you may get nutrition through a feeding tube that is passed through your nose and into your stomach (nasogastric tube, or NG tube). This may be done if you have uncontrolled vomiting or diarrhea. Beverages to avoid Certain beverages may make dehydration worse. While you rehydrate, avoid drinking alcohol. How to tell if you are recovering from dehydration You may be recovering from dehydration if: You are urinating more often than before you started rehydrating. Your urine is pale yellow. Your energy level improves. You vomit less frequently. You have diarrhea less frequently. Your appetite improves or returns to normal. You feel less dizzy or less light-headed. Your skin tone and color start to look more normal. Follow these instructions at home: Take over-the-counter and prescription medicines only as told by your health care provider. Do not take sodium tablets. Doing this can lead to having too much sodium in your body (hypernatremia). Contact a health care provider if: You continue to have symptoms of mild or moderate dehydration,  such as: Thirst. Dry lips. Slightly dry mouth. Dizziness. Dark urine or less urine than normal. Muscle cramps. You continue to vomit or have diarrhea. Get help right away if you: Have symptoms of dehydration that get worse. Have a fever. Have a severe headache. Have been vomiting and the following happens: Your vomiting gets worse or does not go away. Your vomit includes blood or green matter (bile). You cannot eat or drink without vomiting. Have problems with urination or bowel movements, such as: Diarrhea that gets worse or does not go away. Blood in your stool (feces). This may cause stool to look black and tarry. Not urinating, or urinating only a small amount of very dark urine, within 6-8 hours. Have trouble breathing. Have symptoms that get worse with treatment. These symptoms may represent  a serious problem that is an emergency. Do not wait to see if the symptoms will go away. Get medical help right away. Call your local emergency services (911 in the [place] Do not drive yourself to the hospital. Summary Rehydration is the replacement of body fluids and minerals (electrolytes) that are lost during dehydration. Follow instructions from your health care provider for rehydration. The kind of fluid and amount you should drink depend on your condition. Slowly increase how much you drink until you have taken the amount recommended by your health care provider. Contact your health care provider if you continue to show signs of mild or moderate dehydration. This information is not intended to replace advice given to you by your health care provider. Make sure you discuss any questions you have with your health care provider. Document Revised: 09/15/2019 Document Reviewed: 07/26/2019 Elsevier Patient Education  2022 [employer].

## 2021-07-12 NOTE — Progress Notes (Signed)
Per Cassie, PA OK to trt w/ SrCr toady and Alimta 400mg /m2 dose

## 2021-07-12 NOTE — Progress Notes (Addendum)
MD aware of CrCl 32. Proceed with Alimta 400mg /m2. Give 1 Liter NS today as well. Orders entered.  Raul [place], [place], BCPS, BCOP 07/12/2021 12:09 PM

## 2021-07-14 ENCOUNTER — Other Ambulatory Visit: Payer: Self-pay | Admitting: Physician Assistant

## 2021-07-14 DIAGNOSIS — C3491 Malignant neoplasm of unspecified part of right bronchus or lung: Secondary | ICD-10-CM

## 2021-07-14 DIAGNOSIS — E876 Hypokalemia: Secondary | ICD-10-CM

## 2021-07-15 ENCOUNTER — Encounter: Payer: Self-pay | Admitting: Internal Medicine

## 2021-07-16 ENCOUNTER — Other Ambulatory Visit: Payer: Self-pay

## 2021-07-17 ENCOUNTER — Telehealth: Payer: Self-pay

## 2021-07-17 MED ORDER — ALBUTEROL SULFATE (2.5 MG/3ML) 0.083% IN NEBU: 2.5000 mg | INHALATION_SOLUTION | Freq: Four times a day (QID) | RESPIRATORY_TRACT | 12 refills | Status: DC | PRN

## 2021-07-17 NOTE — Addendum Note (Signed)
Addended by: Elby Beck R on: 07/17/2021 03:47 PM   Modules accepted: Orders

## 2021-07-17 NOTE — Telephone Encounter (Signed)
Pts wife called stating he has a hard time swallowing his K tablets and gets chocked up. She has been advised it can be crushed and dissolved into water to a beverage of his choice. Mrs. Belmontes expressed understanding of this information.

## 2021-07-17 NOTE — Telephone Encounter (Signed)
Called and spoke with [name] who states that they have the nebulizer machine but not the medication to go with it. I don't see any nebulizer medication listed on patients med list. Looking at last OV note Dr. Valeta Harms wanted albuterol nebs and machine ordered. RX for albuterol nebs as been sent to preferred pharmacy. Nothing further needed at this time

## 2021-07-18 ENCOUNTER — Inpatient Hospital Stay: Payer: BC Managed Care – PPO

## 2021-07-20 NOTE — Progress Notes (Signed)
[place] OFFICE PROGRESS NOTE  [name], NP [place] [address]  DIAGNOSIS:  Metastatic non-small cell lung cancer initially diagnosed as stage IIIA (T3, N1, M0) non-small cell lung cancer, adenocarcinoma with no actionable mutations presented with multiple pulmonary nodules in the right upper lobe as well as metastatic disease in intraparenchymal lymphadenopathy.  This was diagnosed in February 2020.  The patient has disease recurrence in March 2022. PD-L1 expression 20%. He has no actionable mutations by foundation 1.  PRIOR THERAPY: 1) Status post right upper lobectomy with lymph node dissection. 2) Adjuvant systemic chemotherapy with cisplatin 75 mg/M2 and Alimta 500 mg/M2 every 3 weeks.  Status post 4 cycles. 3) palliative radiotherapy to the metastatic bone disease and the scapula as well as the left supraclavicular area under the care of Dr. Isidore Moos  CURRENT THERAPY: Systemic chemotherapy with carboplatin for AUC of 5, Alimta 500 mg/M2 and Keytruda 200 mg IV every 3 weeks.  First dose November 06, 2020.  Status post 12 cycles.  Starting from cycle #5, the patient will be treated with maintenance Keytruda and Alimta IV every 3 weeks. Dose reduced to Alimta 400 mg/m2 starting from cycle #11 because of worsening anemia.  Keytruda discontinued from cycle #11 due to possible checkpoint inhibitor toxicity of the stomach.  INTERVAL HISTORY: Sean Rose 65 y.o. male returns to the clinic today for a follow-up visit.  In the Fall of 2022, the patient had been having some trouble with intermittent nausea, vomiting, and diarrhea.  He is also been having progressively worsening anemia which requires blood transfusions. For his GI issues, the patient has seen Dr. Carlean Purl who performed an endoscopy which showed possible drug-induced inflammatory changes.  Presume this is from his checkpoint inhibitor treatment with Keytruda. Therefore, his Beryle Flock was  discontinued and he is presently on  single agent chemotherapy with dose reduced Alimta 400 mg per metered squared.   In the interval since his last appointment, he developed pancytopenia despite being on reduced dose Alimta.  He required 1 unit of blood most recent being on 07/31/21 due to a Hbg of 6.5.  He also had critically low potassium for which he received 20 meq of IV potassium.  He received this yesterday and states that he feels about 40% of his baseline compared to 0 yesterday.  He was having significant lightheadedness last week.  He denies any abnormal bleeding except for mild blood-tinged nasal drainage when he blows his nose but denies any overt bleeding.  Denies any hemoptysis, hematemesis, melena, hematochezia, or hematuria.  He bruises easily but this is not new for him.  Regarding the potassium, the patient is a hard time swallowing the potassium tablets.  He was given a prescription yesterday of liquid potassium.  He denies any significant nausea or vomiting except for 1 episode last week.  Denies any recent diarrhea.  He denies any fevers but stays cold due to the anemia.  He reports progressive dyspnea on exertion over the last few months.  He states that it is hard to take a deep breath.  He reports that his cough has improved lately due to using a humidifier.  Denies any headache or visual changes.  Denies any unusual rashes or skin changes.  He also developed significant fatigue and decreased appetite the week following treatment. He recently had a restaging CT scan performed. The patient is here today for evaluation and to review his scan before starting cycle #13.  MEDICAL HISTORY: Past Medical History:  Diagnosis Date   Allergy    Anemia    Anginal pain ([place])    Blood transfusion without reported diagnosis    CAD (coronary artery disease)    CAP (community acquired pneumonia) 09/2016   COPD (chronic obstructive pulmonary disease) (HCC)    Emphysema of lung (HCC)    GERD  (gastroesophageal reflux disease)    Heart murmur    "I was told I had one when I was a kid"   Hemorrhoids    History of anal fissures    "no surgeries" (10/30/2016)   Hyperlipidemia    Hypertension    lung ca dx'd 08/2018   with mets to bones in arms   Myocardial infarction [facility])    Peripheral arterial disease ([place])    status post right common iliac artery stenting back in 2007   Seasonal allergies    Tobacco abuse     ALLERGIES:  is allergic to compazine [prochlorperazine].  MEDICATIONS:  Current Outpatient Medications  Medication Sig Dispense Refill   albuterol (PROVENTIL) (2.5 MG/3ML) 0.083% nebulizer solution Take 3 mLs (2.5 mg total) by nebulization every 6 (six) hours as needed for wheezing or shortness of breath. 75 mL 12   amLODipine (NORVASC) 5 MG tablet Take 1 tablet (5 mg total) by mouth daily. 180 tablet 3   aspirin EC 81 MG tablet Take 81 mg by mouth daily.     clopidogrel (PLAVIX) 75 MG tablet TAKE 1 TABLET BY MOUTH EVERY DAY 90 tablet 3   esomeprazole (NEXIUM) 20 MG capsule Take 40 mg by mouth daily.     fexofenadine (ALLEGRA) 60 MG tablet Take 60 mg by mouth 2 (two) times daily.     Fluticasone-Umeclidin-Vilant (TRELEGY ELLIPTA) 100-62.5-25 MCG/ACT AEPB Inhale 1 puff into the lungs daily. 60 each 3   folic acid (FOLVITE) 1 MG tablet TAKE 1 TABLET(1 MG) BY MOUTH DAILY 30 tablet 4   lidocaine (XYLOCAINE) 2 % solution Use as directed 15 mLs in the mouth or throat as needed for mouth pain. 100 mL 0   lidocaine-prilocaine (EMLA) cream Apply to the Port-A-Cath site 30-60 minutes before treatment. 30 g 0   Magnesium 500 MG TABS Take 1 tablet (500 mg total) by mouth 3 (three) times daily. 8 tablet 0   metoprolol succinate (TOPROL XL) 25 MG 24 hr tablet Take 1 tablet (25 mg total) by mouth daily. 90 tablet 3   Multiple Vitamin (MULTIVITAMIN WITH MINERALS) TABS tablet Take 1 tablet by mouth daily.     nitroGLYCERIN (NITROSTAT) 0.4 MG SL tablet PLACE 1 TABLET UNDER THE TONGUE  EVERY 5 MINUTES AS NEEDED FOR CHEST PAIN. 25 tablet 6   ondansetron (ZOFRAN) 8 MG tablet TAKE 1 TABLET(8 MG) BY MOUTH EVERY 8 HOURS AS NEEDED FOR NAUSEA OR VOMITING 20 tablet 0   potassium chloride 20 MEQ/15ML (10%) SOLN Take 15 mLs (20 mEq total) by mouth 2 (two) times daily. 300 mL 0   promethazine (PHENERGAN) 12.5 MG tablet Take 1 tablet (12.5 mg total) by mouth every 6 (six) hours as needed for nausea or vomiting. 30 tablet 0   rosuvastatin (CRESTOR) 5 MG tablet TAKE 1 TABLET(5 MG) BY MOUTH EVERY OTHER DAY 45 tablet 3   triamcinolone cream (KENALOG) 0.1 % Apply 1 application topically 2 (two) times daily. 30 g 1   umeclidinium-vilanterol (ANORO ELLIPTA) 62.5-25 MCG/INH AEPB Inhale 1 puff into the lungs daily. 60 each 5   No current facility-administered medications for this  visit.   Facility-Administered Medications Ordered in Other Visits  Medication Dose Route Frequency Provider Last Rate Last Admin   diphenhydrAMINE (BENADRYL) 25 mg capsule             SURGICAL HISTORY:  Past Surgical History:  Procedure Laterality Date   ANKLE SURGERY Left    "rebuilt it"   ANTERIOR CRUCIATE LIGAMENT REPAIR Right    CARDIAC CATHETERIZATION  09/23/2008   Continued medical therapy - may need GI evaluation in addition.   CARDIAC CATHETERIZATION  10/28/2007   Medical therapy recommended.   CARDIAC CATHETERIZATION  11/18/2006   In-stent restenosis RCA  (50% distal edge, 80% segmental mid, and 50-60% segmental proximal). Successful cutting balloon atherectomy using a [id number] cutting balloon. 3 inflations with atherectomy performed on mid and proximal portions resulting in reduction of 80% mid in-stent restenosis to less than 20% residual and 50-60% segmental proximal to less than 20% residual without dissection.   CARDIAC CATHETERIZATION  02/26/2006   Severe stenosis in RCA. Stenting performed using IVUS. 3.5x20 Maverick balloon deployed at [employer]. Distal stent-a 4x28 Liberte stent-deployed 12atm 48sec,  12atm 31sec, 4atm 19sec. Mid stent-a 4x28 Liberte stent-deployed 14atm 45sec, 14atm 60sec, 14atm 44sec. Proximal stent-4x8 Liberte- 14atm 45sec,14atm 47sec, 16atm 43sec. Severely diseased segment then appeared TIMI-3 flow.   CARDIOVASCULAR STRESS TEST  11/17/2012   No significant ECG changes. Septal perfusion defect is new when complared to study from 2010. Abnormal myocardial perfusion imaging with a basal to mid perfusion suggestive of previous MI.   CAROTID DOPPLER  08/09/2011   Bilateral Bulb/Proximal ICA - demonstrated a mild amount of fibrous plaque without evidence of significant diameter reduction reduction or other vascular abnormality.   CHEST TUBE INSERTION Right 09/02/2018   Procedure: Chest Tube Insertion;  Surgeon: Garner Nash, DO;  Location: [place];  Service: Thoracic;  Laterality: Right;   COLONOSCOPY     2003, 2014   CORONARY ANGIOPLASTY WITH STENT PLACEMENT     FEMORAL ARTERY STENT     INGUINAL HERNIA REPAIR Right    IR IMAGING GUIDED PORT INSERTION  11/13/2020   KNEE ARTHROSCOPY Right "multiple"   LAPAROSCOPIC APPENDECTOMY N/A 12/07/2018   Procedure: APPENDECTOMY LAPAROSCOPIC;  Surgeon: Coralie Keens, MD;  Location: [facility];  Service: General;  Laterality: N/A;   LOWER EXTREMITY ARTERIAL DOPPLER  01/31/2011   Bilateral ABIs-normal values with no suggestion of arterial insuff to the lower extremities at rest. Right CIA stent-mild amount of nonhemodynamically significant plaque is noted throughout   Sm[place]   "bone-eating tumor"   PERCUTANEOUS STENT INTERVENTION  04/04/2006 & 04/13/2015   a. Right common iliac artery with an 8.0x18 mm Herculink stent deployed at 12 atm. Stenosis was reduced from 80% to 0% with brisk flow. b. I-cast stenting to left common iliac artery   PERIPHERAL VASCULAR CATHETERIZATION N/A 04/13/2015   Procedure: Lower Extremity Angiography;  Surgeon: Lorretta Harp, MD; L-oCIA 75%, 40-50% L-EIA, R-CIA stent patent, s/p 8 mm x 38 mm ICast  covered stent>>0% stenosis in [place] Right    TRANSTHORACIC ECHOCARDIOGRAM  11/26/2012   EF not noted. Aortic valve-sclerosis without stenosis, no regurgiation.    UPPER GASTROINTESTINAL ENDOSCOPY     US CAROTID DOPPLER BILATERAL ([place] [facility])  08/09/2011   Bilateral Bulb/Proximal ICAa demonstrated a mild amount of fibrous plaque without evidence of significant diameter reduction or any other vascular abnormality.   VIDEO ASSISTED THORACOSCOPY (VATS)/ LOBECTOMY Right 09/14/2018  Procedure: RIGHT VIDEO ASSISTED THORACOSCOPY (VATS)/ RIGHT UPPER LOBECTOMY;  Surgeon: Melrose Nakayama, MD;  Location: [place];  Service: Thoracic;  Laterality: Right;   VIDEO BRONCHOSCOPY WITH ENDOBRONCHIAL NAVIGATION N/A 09/02/2018   Procedure: VIDEO BRONCHOSCOPY WITH ENDOBRONCHIAL NAVIGATION;  Surgeon: Garner Nash, DO;  Location: [place];  Service: Thoracic;  Laterality: N/A;    REVIEW OF SYSTEMS:   Review of Systems  Constitutional: Positive for fatigue, decreased appetite, weight loss.  Negative for chills and fever. HENT:  Negative for mouth sores, nosebleeds, sore throat and trouble swallowing.   Eyes: Negative for eye problems and icterus.  Respiratory: Positive for dyspnea on exertion.  Negative for cough, hemoptysis, and wheezing.   Cardiovascular: Negative for chest pain and leg swelling.  Gastrointestinal: Positive for 1 episode of nausea and vomiting last week.  Negative for abdominal pain, constipation, and diarrhea.  Genitourinary: Negative for bladder incontinence, difficulty urinating, dysuria, frequency and hematuria.   Musculoskeletal: Negative for back pain, gait problem, neck pain and neck stiffness.  Skin: Negative for itching and rash.  Neurological: Positive for lightheaded and dizziness.  Negative for extremity weakness, gait problem, headaches, and seizures.  Hematological: Negative for adenopathy.  Does not for easy  bruising. Psychiatric/Behavioral: Negative for confusion, depression and sleep disturbance. The patient is not nervous/anxious.     PHYSICAL EXAMINATION:  Blood pressure 127/89, pulse 75, temperature 97.7 F (36.5 C), temperature source Axillary, resp. rate 16, height '5\' 8"'  (1.727 m), weight 103 lb 8 oz (46.9 kg), SpO2 100 %.  ECOG PERFORMANCE STATUS: 1-2  Physical Exam  Constitutional: Oriented to person, place, and time and thin appearing male. No distress.  HENT:  Head: Normocephalic and atraumatic.   Mouth/Throat: Oropharynx is clear and moist. No oropharyngeal exudate.  Eyes: Conjunctivae are normal. Right eye exhibits no discharge. Left eye exhibits no discharge. No scleral icterus.  Neck: Normal range of motion. Neck supple.  Cardiovascular: Normal rate, regular rhythm, normal heart sounds and intact distal pulses.   Pulmonary/Chest: Effort normal.  Quiet breath sounds bilaterally, rub noted in right lung. no respiratory distress. No wheezes. No rales.  Abdominal: Soft. Bowel sounds are normal. Exhibits no distension and no mass. There is no tenderness.  Musculoskeletal: Normal range of motion. Exhibits no edema.  Lymphadenopathy:    No cervical adenopathy.  Neurological: Alert and oriented to person, place, and time.  Exhibits muscle wasting.  Examined in the wheelchair.    Skin: Skin is warm and dry. No rash noted. Not diaphoretic. No erythema. No pallor.  Positive for bruises on upper extremity bilaterally. Psychiatric: Mood, memory and judgment normal.  Vitals reviewed.  LABORATORY DATA: Lab Results  Component Value Date   WBC 5.6 08/01/2021   HGB 9.1 (L) 08/01/2021   HCT 24.8 (L) 08/01/2021   MCV 87.6 08/01/2021   PLT 100 (L) 08/01/2021      Chemistry      Component Value Date/Time   NA 128 (L) 08/01/2021 0748   NA 130 (L) 03/05/2017 1110   K 3.1 (L) 08/01/2021 0748   CL 92 (L) 08/01/2021 0748   CO2 27 08/01/2021 0748   BUN 13 08/01/2021 0748   BUN 4 (L)  03/05/2017 1110   CREATININE 1.63 (H) 08/01/2021 0748   CREATININE 0.76 04/28/2020 0826      Component Value Date/Time   CALCIUM 8.4 (L) 08/01/2021 0748   ALKPHOS 127 (H) 08/01/2021 0748   AST 71 (H) 08/01/2021 0748   ALT 44 08/01/2021 0748  BILITOT 0.7 08/01/2021 0748       RADIOGRAPHIC STUDIES:  CT Abdomen Pelvis Wo Contrast  Result Date: 07/31/2021 CLINICAL DATA:  Primary Cancer Type: Lung Imaging Indication: Assess response to therapy Interval therapy since last imaging? Yes Initial Cancer Diagnosis Date: 09/11/2018; Established by: Biopsy-proven Detailed Pathology: Metastatic non-small cell lung cancer initially diagnosed as stage IIIA non-small cell lung cancer, adenocarcinoma. Primary Tumor location:  Right upper lobe. Recurrence? Yes; Date(s) of recurrence: 10/06/2020; Established by: Biopsy-proven Surgeries: Right upper lobectomy 09/14/2018. Appendectomy 12/07/2018. Coronary stent. Chemotherapy: Yes; Ongoing? Yes; Most recent administration: 07/12/2021 Immunotherapy?  Yes; Type: Keytruda; Ongoing? No Radiation therapy? Yes; Date Range: 10/24/2020; Target: Left scapula. EXAM: CT CHEST, ABDOMEN AND PELVIS WITHOUT CONTRAST TECHNIQUE: Multidetector CT imaging of the chest, abdomen and pelvis was performed following the standard protocol without IV contrast. COMPARISON:  Most recent CT chest, abdomen and pelvis 05/14/2021. 09/25/2020 PET-CT. FINDINGS: CT CHEST FINDINGS Cardiovascular: The heart is normal in size. No pericardial effusion. Stable tortuosity and age advanced calcification involving the thoracic aorta. Stable significant age advanced three-vessel coronary artery calcifications. Mediastinum/Nodes: No mediastinal or hilar mass or lymphadenopathy. Esophagus is grossly normal. Lungs/Pleura: Stable severe underlying emphysematous changes and significant pulmonary scarring. Stable surgical changes from a right upper lobe lobectomy. No findings suspicious for residual or recurrent tumor.  Stable significant left apical scarring changes.  No discrete mass. Slight interval enlargement of a left pleural effusion along with some persistent fluid in the left major fissure but no pleural mass or nodularity. Very small right basilar pleural effusion is also noted. No new pulmonary nodules to suggest pulmonary metastatic disease. Musculoskeletal: No chest wall mass, supraclavicular or axillary adenopathy. The bony thorax is intact. Stable mixed lytic and sclerotic changes involving the upper left scapula consistent with treated metastatic disease. No new bone lesions. CT ABDOMEN PELVIS FINDINGS Hepatobiliary: No hepatic lesions or intrahepatic biliary dilatation. Gallbladder is grossly normal. No common bile duct dilatation. Pancreas: No mass, inflammation or ductal dilatation. Moderate pancreatic atrophy. Spleen: The spleen is small.  No focal lesions. Adrenals/Urinary Tract: Mild thickening of both adrenal glands likely benign hypertrophy. No adrenal gland lesions. The kidneys are unremarkable and stable. The bladder is unremarkable. Stomach/Bowel: The stomach, duodenum, small bowel and colon are grossly normal. Vascular/Lymphatic: Significant age advanced atherosclerotic calcifications involving the aorta and branch vessels. No aneurysm. Bilateral common iliac artery stents are again noted. No mesenteric or retroperitoneal mass or adenopathy. Reproductive: The prostate gland and seminal vesicles are unremarkable. Other: No pelvic mass or adenopathy. No free pelvic fluid collections. No inguinal mass or adenopathy. No abdominal wall hernia or subcutaneous lesions. Musculoskeletal: No significant bony findings. IMPRESSION: 1. Stable surgical changes from a right upper lobe lobectomy. No findings suspicious for residual or recurrent tumor. 2. Stable left apical scarring changes. No discrete mass. 3. Slight interval enlargement of the left pleural effusion along with some persistent fluid in the left major  fissure but no pleural mass or nodularity. 4. No new pulmonary nodules to suggest pulmonary metastatic disease. 5. Stable severe emphysematous changes and pulmonary scarring. 6. Stable age advanced atherosclerotic calcifications involving the thoracic and abdominal aorta and branch vessels including the coronary arteries. 7. Stable treated metastatic lesion involving the upper left scapula. No new bone lesions. 8. No findings for abdominal/pelvic metastatic disease. Aortic Atherosclerosis (ICD10-I70.0) and Emphysema (ICD10-J43.9). Electronically Signed   By: Marijo Sanes M.D.   On: 07/31/2021 11:22   CT Chest Wo Contrast  Result Date: 07/31/2021 CLINICAL DATA:  Primary  Cancer Type: Lung Imaging Indication: Assess response to therapy Interval therapy since last imaging? Yes Initial Cancer Diagnosis Date: 09/11/2018; Established by: Biopsy-proven Detailed Pathology: Metastatic non-small cell lung cancer initially diagnosed as stage IIIA non-small cell lung cancer, adenocarcinoma. Primary Tumor location:  Right upper lobe. Recurrence? Yes; Date(s) of recurrence: 10/06/2020; Established by: Biopsy-proven Surgeries: Right upper lobectomy 09/14/2018. Appendectomy 12/07/2018. Coronary stent. Chemotherapy: Yes; Ongoing? Yes; Most recent administration: 07/12/2021 Immunotherapy?  Yes; Type: Keytruda; Ongoing? No Radiation therapy? Yes; Date Range: 10/24/2020; Target: Left scapula. EXAM: CT CHEST, ABDOMEN AND PELVIS WITHOUT CONTRAST TECHNIQUE: Multidetector CT imaging of the chest, abdomen and pelvis was performed following the standard protocol without IV contrast. COMPARISON:  Most recent CT chest, abdomen and pelvis 05/14/2021. 09/25/2020 PET-CT. FINDINGS: CT CHEST FINDINGS Cardiovascular: The heart is normal in size. No pericardial effusion. Stable tortuosity and age advanced calcification involving the thoracic aorta. Stable significant age advanced three-vessel coronary artery calcifications. Mediastinum/Nodes: No  mediastinal or hilar mass or lymphadenopathy. Esophagus is grossly normal. Lungs/Pleura: Stable severe underlying emphysematous changes and significant pulmonary scarring. Stable surgical changes from a right upper lobe lobectomy. No findings suspicious for residual or recurrent tumor. Stable significant left apical scarring changes.  No discrete mass. Slight interval enlargement of a left pleural effusion along with some persistent fluid in the left major fissure but no pleural mass or nodularity. Very small right basilar pleural effusion is also noted. No new pulmonary nodules to suggest pulmonary metastatic disease. Musculoskeletal: No chest wall mass, supraclavicular or axillary adenopathy. The bony thorax is intact. Stable mixed lytic and sclerotic changes involving the upper left scapula consistent with treated metastatic disease. No new bone lesions. CT ABDOMEN PELVIS FINDINGS Hepatobiliary: No hepatic lesions or intrahepatic biliary dilatation. Gallbladder is grossly normal. No common bile duct dilatation. Pancreas: No mass, inflammation or ductal dilatation. Moderate pancreatic atrophy. Spleen: The spleen is small.  No focal lesions. Adrenals/Urinary Tract: Mild thickening of both adrenal glands likely benign hypertrophy. No adrenal gland lesions. The kidneys are unremarkable and stable. The bladder is unremarkable. Stomach/Bowel: The stomach, duodenum, small bowel and colon are grossly normal. Vascular/Lymphatic: Significant age advanced atherosclerotic calcifications involving the aorta and branch vessels. No aneurysm. Bilateral common iliac artery stents are again noted. No mesenteric or retroperitoneal mass or adenopathy. Reproductive: The prostate gland and seminal vesicles are unremarkable. Other: No pelvic mass or adenopathy. No free pelvic fluid collections. No inguinal mass or adenopathy. No abdominal wall hernia or subcutaneous lesions. Musculoskeletal: No significant bony findings. IMPRESSION: 1.  Stable surgical changes from a right upper lobe lobectomy. No findings suspicious for residual or recurrent tumor. 2. Stable left apical scarring changes. No discrete mass. 3. Slight interval enlargement of the left pleural effusion along with some persistent fluid in the left major fissure but no pleural mass or nodularity. 4. No new pulmonary nodules to suggest pulmonary metastatic disease. 5. Stable severe emphysematous changes and pulmonary scarring. 6. Stable age advanced atherosclerotic calcifications involving the thoracic and abdominal aorta and branch vessels including the coronary arteries. 7. Stable treated metastatic lesion involving the upper left scapula. No new bone lesions. 8. No findings for abdominal/pelvic metastatic disease. Aortic Atherosclerosis (ICD10-I70.0) and Emphysema (ICD10-J43.9). Electronically Signed   By: Marijo Sanes M.D.   On: 07/31/2021 11:22     ASSESSMENT/PLAN:  This is a very pleasant 65 year old Caucasian male who was initially diagnosed with stage IIIB non-small cell lung cancer, adenocarcinoma. He is status post a right upper lobectomy with lymph node dissection  under the care of [name].  He was negative for any actionable mutations.  He completed adjuvant chemotherapy with cisplatin and Alimta for 4 cycles.  He tolerated this fairly well.  His PD-L1 expression is 20%.    The patient had been on observation until imaging studies in February 2022 showed suspicious bone lesions in the left shoulder area.  A PET scan was performed on 09/25/2020 which showed metastatic disease involving the left scapula and left supraclavicular lymph nodes. The patient had a ultrasound-guided biopsy of the left supraclavicular lymph node and it was positive for metastatic carcinoma.  The patient then underwent palliative radiotherapy to the metastatic bone lesion in the left scapula under the care of Dr. Isidore Moos.   The patient is currently undergoing systemic chemotherapy with  carboplatin for an AUC of 5, Alimta 500 mg per metered squared, Keytruda 200 mg IV every 3 weeks.  The patient is status post 12 cycles.  Starting from cycle #5, the patient started maintenance Alimta and Keytruda.  Alimta was reduced to 400 mg per metered squared starting from cycle #10.  Beryle Flock was discontinued starting from cycle #11 due to possible checkpoint inhibitor toxicity in the stomach.  The patient recently had a restaging CT scan performed.  Dr. [name] personally and independently reviewed the scan and discussed results with the patient today.  The scan showed no evidence of disease progression.   Dr. [name] had a lengthy discussion with the patient about his current condition and treatment options.  Dr. [name] does not feel comfortable with the patient's current condition continuing with treatment and he gave the option of taking a break for treatment for 37-monthwith a restaging CT scan.  Given that the patient scan did not show disease progression, Dr. MJulien Nordmanndiscussed this is a reasonable option.  The patient is interested in this option and feels that he can be back to his baseline with a 375-monthreak.  The patient will not get treatment today.   I will arrange for flush appointments every 6 to 8 weeks.  I have placed an order for CT scan the chest, abdomen, and pelvis to be performed in 3 months.  If his kidney function allows on the day of scan, we will arrange for this to be performed with contrast.  If his kidney function does not allow, it is okay to hold the IV contrast due to a history of renal insufficiency.  The patient was given stool cards yesterday to assess for GI blood loss.  The patient was instructed to return these at his earliest convenience.  Regarding the anemia, the patient's hemoglobin today is 9.1.  No need for a blood transfusion today.  He will continue taking the liquid potassium supplement as prescribed.  If the patient develops worsening signs and  symptoms of anemia, he was advised to call the clinic and we can always arrange for a lab appointment if needed.   The patient was advised to call immediately if he has any concerning symptoms in the interval. The patient voices understanding of current disease status and treatment options and is in agreement with the current care plan. All questions were answered. The patient knows to call the clinic with any problems, questions or concerns. We can certainly see the patient much sooner if necessary                 Orders Placed This Encounter  Procedures   CT Chest W Contrast    Can hold  IV contrast if creatinine is <1.5    Standing Status:   Future    Standing Expiration Date:   08/01/2022    Order Specific Question:   If indicated for the ordered procedure, I authorize the administration of contrast media per Radiology protocol    Answer:   Yes    Order Specific Question:   Preferred imaging location?    Answer:   [facility]   CT Abdomen Pelvis W Contrast    Can hold IV contrast if creatinine is <1.5    Standing Status:   Future    Standing Expiration Date:   08/01/2022    Order Specific Question:   If indicated for the ordered procedure, I authorize the administration of contrast media per Radiology protocol    Answer:   Yes    Order Specific Question:   Preferred imaging location?    Answer:   [facility]    Order Specific Question:   Is Oral Contrast requested for this exam?    Answer:   Yes, Per Radiology protocol   CBC with Differential ([place] Only)    Standing Status:   Future    Standing Expiration Date:   08/01/2022   CMP ([place] only)    Standing Status:   Future    Standing Expiration Date:   08/01/2022      Tobe Sos Letina Luckett, PA-C 08/01/21  ADDENDUM: Hematology/Oncology Attending: I had a face-to-face encounter with the patient today.  I reviewed his lab, scan and recommended his care plan.  This is a very pleasant  65 years old white male with metastatic non-small cell lung cancer, adenocarcinoma with no actionable mutations and PD-L1 expression of 20%. The patient started induction systemic chemotherapy with carboplatin, Alimta and Keytruda for 4 cycles followed by maintenance Alimta and Keytruda status post 12 cycles.  Beryle Flock was discontinued starting from cycle #10 because of the suspicious immunotherapy induced gastritis.  The patient continues on reduced dose Alimta for 2 more cycles but he continues to have significant fatigue and weakness as well as pancytopenia requiring PRBCs transfusion. He had repeat CT scan of the chest, abdomen pelvis performed recently.  I personally and independently reviewed the scan and discussed the results with the patient and his wife. His scan showed no concerning findings for disease progression. I had a lengthy discussion with the patient about his condition.  I recommended for him to take a break of treatment for the next 3 months because of his stable disease and the significant adverse effect of his treatment.  The patient and his wife are in agreement with this plan especially with the poor quality of life he is currently dealing with on the treatment.  He will be on observation for now. We will arrange for the patient to come back for follow-up visit in 3 months with repeat CT scan of the chest, abdomen and pelvis. The patient was advised to call immediately if he has any other concerning symptoms in the interval. The total time spent in the appointment was 40 minutes. Eilleen Kempf, MD 08/01/21

## 2021-07-25 ENCOUNTER — Inpatient Hospital Stay: Payer: BC Managed Care – PPO

## 2021-07-25 ENCOUNTER — Ambulatory Visit: Payer: BC Managed Care – PPO | Admitting: Physician Assistant

## 2021-07-25 ENCOUNTER — Other Ambulatory Visit: Payer: BC Managed Care – PPO

## 2021-07-25 ENCOUNTER — Ambulatory Visit: Payer: BC Managed Care – PPO

## 2021-07-27 ENCOUNTER — Telehealth: Payer: Self-pay

## 2021-07-27 ENCOUNTER — Inpatient Hospital Stay: Payer: BC Managed Care – PPO

## 2021-07-27 ENCOUNTER — Other Ambulatory Visit: Payer: Self-pay

## 2021-07-27 ENCOUNTER — Ambulatory Visit (HOSPITAL_COMMUNITY)
Admission: RE | Admit: 2021-07-27 | Discharge: 2021-07-27 | Disposition: A | Discharge status: Home / Self Care | Payer: BC Managed Care – PPO | Source: Ambulatory Visit | Attending: Physician Assistant | Admitting: Physician Assistant

## 2021-07-27 DIAGNOSIS — J90 Pleural effusion, not elsewhere classified: Secondary | ICD-10-CM | POA: Diagnosis not present

## 2021-07-27 DIAGNOSIS — I700 Atherosclerosis of aorta: Secondary | ICD-10-CM | POA: Diagnosis not present

## 2021-07-27 DIAGNOSIS — C3491 Malignant neoplasm of unspecified part of right bronchus or lung: Secondary | ICD-10-CM

## 2021-07-27 DIAGNOSIS — K8689 Other specified diseases of pancreas: Secondary | ICD-10-CM | POA: Diagnosis not present

## 2021-07-27 DIAGNOSIS — Z95828 Presence of other vascular implants and grafts: Secondary | ICD-10-CM

## 2021-07-27 DIAGNOSIS — J439 Emphysema, unspecified: Secondary | ICD-10-CM | POA: Diagnosis not present

## 2021-07-27 DIAGNOSIS — C3490 Malignant neoplasm of unspecified part of unspecified bronchus or lung: Secondary | ICD-10-CM | POA: Diagnosis not present

## 2021-07-27 LAB — CBC WITH DIFFERENTIAL (CANCER CENTER ONLY)
Abs Immature Granulocytes: 0.02 10*3/uL (ref 0.00–0.07)
Basophils Absolute: 0 10*3/uL (ref 0.0–0.1)
Basophils Relative: 0 %
Eosinophils Absolute: 0 10*3/uL (ref 0.0–0.5)
Eosinophils Relative: 1 %
HCT: 19.7 % — ABNORMAL LOW (ref 39.0–52.0)
Hemoglobin: 7.1 g/dL — ABNORMAL LOW (ref 13.0–17.0)
Immature Granulocytes: 1 %
Lymphocytes Relative: 44 %
Lymphs Abs: 1.7 10*3/uL (ref 0.7–4.0)
MCH: 33.6 pg (ref 26.0–34.0)
MCHC: 36 g/dL (ref 30.0–36.0)
MCV: 93.4 fL (ref 80.0–100.0)
Monocytes Absolute: 0.5 10*3/uL (ref 0.1–1.0)
Monocytes Relative: 13 %
Neutro Abs: 1.6 10*3/uL — ABNORMAL LOW (ref 1.7–7.7)
Neutrophils Relative %: 41 %
Platelet Count: 33 10*3/uL — ABNORMAL LOW (ref 150–400)
RBC: 2.11 MIL/uL — ABNORMAL LOW (ref 4.22–5.81)
RDW: 19.1 % — ABNORMAL HIGH (ref 11.5–15.5)
Smear Review: NORMAL
WBC Count: 3.9 10*3/uL — ABNORMAL LOW (ref 4.0–10.5)
nRBC: 0 % (ref 0.0–0.2)

## 2021-07-27 LAB — CMP (CANCER CENTER ONLY)
ALT: 60 U/L — ABNORMAL HIGH (ref 0–44)
AST: 107 U/L — ABNORMAL HIGH (ref 15–41)
Albumin: 2.8 g/dL — ABNORMAL LOW (ref 3.5–5.0)
Alkaline Phosphatase: 114 U/L (ref 38–126)
Anion gap: 8 (ref 5–15)
BUN: 17 mg/dL (ref 8–23)
CO2: 28 mmol/L (ref 22–32)
Calcium: 8 mg/dL — ABNORMAL LOW (ref 8.9–10.3)
Chloride: 87 mmol/L — ABNORMAL LOW (ref 98–111)
Creatinine: 1.69 mg/dL — ABNORMAL HIGH (ref 0.61–1.24)
GFR, Estimated: 44 mL/min — ABNORMAL LOW (ref >60)
Glucose, Bld: 114 mg/dL — ABNORMAL HIGH (ref 70–99)
Potassium: 2.6 mmol/L — CL (ref 3.5–5.1)
Sodium: 123 mmol/L — ABNORMAL LOW (ref 135–145)
Total Bilirubin: 0.6 mg/dL (ref 0.3–1.2)
Total Protein: 6.9 g/dL (ref 6.5–8.1)

## 2021-07-27 LAB — SAMPLE TO BLOOD BANK

## 2021-07-27 MED ORDER — SODIUM CHLORIDE 0.9% FLUSH
10.0000 mL | Freq: Once | INTRAVENOUS | Status: AC
Start: 2021-07-27 — End: 2021-07-27
  Administered 2021-07-27: 15:00:00 10 mL

## 2021-07-27 NOTE — Telephone Encounter (Signed)
Spoke with pt via telephone regarding lab results.  Ulice Dash from [facility] came by Dr. Worthy Flank office d/t critical lab value results.  Pt's Hgb 7.1 and plts 30.  Ulice Dash spoke with Dr. Burr Medico regarding the critical lab results.  Dr. Burr Medico asked this RN to contact pt to see if the pt could return on Tuesday 08/01/2021 for another blood draw and possible blood transfusion.  Spoke with [facility] to see if they can do blood on Tuesday 08/01/2021 and [facility] stated they can see the pt on Tuesday at 11am.  Asked pt if he could come in on 08/01/2021 for another lab draw and possible blood transfusion.  Pt stated "Yes".  Asked pt how he was feeling.  Pt stated he is extremely fatigue which interferes with him performing ADLs independently and has SOB at rest.  Pt stated he has had many blood transfusion in the past.  Instructed pt to go to the nearest ED should his symptoms get worse over the holiday weekend.  Informed pt that [facility] will be closed on Monday in observance of the holiday.  Pt verbalized understanding of instructions.  Scheduled appt for lab and [facility] for 08/01/2021.  Notified Dr. [name] and his staff of this RN's conversation with pt.  Carbon Copied Dr. Burr Medico to message as well.  Called pt back to tell him he's already scheduled to see Cassie on 08/01/2021 & infusion so we will just keep his appts as originally scheduled.

## 2021-07-27 NOTE — Progress Notes (Signed)
Call received from Ulice Dash in the [facility] lab advising pts labs drawn today 07/27/21 might be contaminated. Recommended pt have his labs redrawn.   Pt is in Radiology at this time. I have called them and spoken with Tedra Coupe who will send pt back to the [facility] once pt is out of CT. Lab appt has been created for the pt and the lab has been advised of this.

## 2021-07-27 NOTE — Progress Notes (Signed)
Labs redrawn on request via portacath. Pt tolerated well. Port deaccessed. Pt to lobby via wheelchair in stable condition.

## 2021-07-31 ENCOUNTER — Inpatient Hospital Stay: Payer: BC Managed Care – PPO

## 2021-07-31 ENCOUNTER — Other Ambulatory Visit: Payer: Self-pay | Admitting: Physician Assistant

## 2021-07-31 ENCOUNTER — Inpatient Hospital Stay: Payer: BC Managed Care – PPO | Attending: Internal Medicine

## 2021-07-31 ENCOUNTER — Ambulatory Visit: Payer: BC Managed Care – PPO | Admitting: Physician Assistant

## 2021-07-31 ENCOUNTER — Telehealth: Payer: Self-pay | Admitting: Medical Oncology

## 2021-07-31 ENCOUNTER — Telehealth: Payer: Self-pay

## 2021-07-31 ENCOUNTER — Other Ambulatory Visit: Payer: Self-pay

## 2021-07-31 ENCOUNTER — Ambulatory Visit (HOSPITAL_COMMUNITY): Payer: BC Managed Care – PPO

## 2021-07-31 VITALS — BP 129/80 | HR 66 | Temp 98.2°F | Resp 16

## 2021-07-31 DIAGNOSIS — R197 Diarrhea, unspecified: Secondary | ICD-10-CM | POA: Diagnosis not present

## 2021-07-31 DIAGNOSIS — C770 Secondary and unspecified malignant neoplasm of lymph nodes of head, face and neck: Secondary | ICD-10-CM | POA: Insufficient documentation

## 2021-07-31 DIAGNOSIS — E876 Hypokalemia: Secondary | ICD-10-CM | POA: Insufficient documentation

## 2021-07-31 DIAGNOSIS — R630 Anorexia: Secondary | ICD-10-CM | POA: Diagnosis not present

## 2021-07-31 DIAGNOSIS — C3491 Malignant neoplasm of unspecified part of right bronchus or lung: Secondary | ICD-10-CM

## 2021-07-31 DIAGNOSIS — D649 Anemia, unspecified: Secondary | ICD-10-CM

## 2021-07-31 DIAGNOSIS — D6481 Anemia due to antineoplastic chemotherapy: Secondary | ICD-10-CM

## 2021-07-31 DIAGNOSIS — Z5111 Encounter for antineoplastic chemotherapy: Secondary | ICD-10-CM | POA: Insufficient documentation

## 2021-07-31 DIAGNOSIS — R5383 Other fatigue: Secondary | ICD-10-CM | POA: Insufficient documentation

## 2021-07-31 DIAGNOSIS — R112 Nausea with vomiting, unspecified: Secondary | ICD-10-CM | POA: Insufficient documentation

## 2021-07-31 DIAGNOSIS — D61818 Other pancytopenia: Secondary | ICD-10-CM | POA: Insufficient documentation

## 2021-07-31 DIAGNOSIS — T451X5A Adverse effect of antineoplastic and immunosuppressive drugs, initial encounter: Secondary | ICD-10-CM

## 2021-07-31 DIAGNOSIS — C3411 Malignant neoplasm of upper lobe, right bronchus or lung: Secondary | ICD-10-CM | POA: Diagnosis not present

## 2021-07-31 LAB — CMP (CANCER CENTER ONLY)
ALT: 46 U/L — ABNORMAL HIGH (ref 0–44)
AST: 77 U/L — ABNORMAL HIGH (ref 15–41)
Albumin: 2.9 g/dL — ABNORMAL LOW (ref 3.5–5.0)
Alkaline Phosphatase: 124 U/L (ref 38–126)
Anion gap: 8 (ref 5–15)
BUN: 14 mg/dL (ref 8–23)
CO2: 27 mmol/L (ref 22–32)
Calcium: 8.1 mg/dL — ABNORMAL LOW (ref 8.9–10.3)
Chloride: 91 mmol/L — ABNORMAL LOW (ref 98–111)
Creatinine: 1.63 mg/dL — ABNORMAL HIGH (ref 0.61–1.24)
GFR, Estimated: 46 mL/min — ABNORMAL LOW (ref >60)
Glucose, Bld: 132 mg/dL — ABNORMAL HIGH (ref 70–99)
Potassium: 2.7 mmol/L — CL (ref 3.5–5.1)
Sodium: 126 mmol/L — ABNORMAL LOW (ref 135–145)
Total Bilirubin: 0.4 mg/dL (ref 0.3–1.2)
Total Protein: 6.3 g/dL — ABNORMAL LOW (ref 6.5–8.1)

## 2021-07-31 LAB — SAMPLE TO BLOOD BANK

## 2021-07-31 LAB — CBC WITH DIFFERENTIAL (CANCER CENTER ONLY)
Abs Immature Granulocytes: 0.04 10*3/uL (ref 0.00–0.07)
Basophils Absolute: 0 10*3/uL (ref 0.0–0.1)
Basophils Relative: 0 %
Eosinophils Absolute: 0 10*3/uL (ref 0.0–0.5)
Eosinophils Relative: 0 %
HCT: 18.3 % — ABNORMAL LOW (ref 39.0–52.0)
Hemoglobin: 6.5 g/dL — CL (ref 13.0–17.0)
Immature Granulocytes: 1 %
Lymphocytes Relative: 30 %
Lymphs Abs: 1.3 10*3/uL (ref 0.7–4.0)
MCH: 33.3 pg (ref 26.0–34.0)
MCHC: 35.5 g/dL (ref 30.0–36.0)
MCV: 93.8 fL (ref 80.0–100.0)
Monocytes Absolute: 0.6 10*3/uL (ref 0.1–1.0)
Monocytes Relative: 14 %
Neutro Abs: 2.3 10*3/uL (ref 1.7–7.7)
Neutrophils Relative %: 55 %
Platelet Count: 77 10*3/uL — ABNORMAL LOW (ref 150–400)
RBC: 1.95 MIL/uL — ABNORMAL LOW (ref 4.22–5.81)
RDW: 19.1 % — ABNORMAL HIGH (ref 11.5–15.5)
WBC Count: 4.1 10*3/uL (ref 4.0–10.5)
nRBC: 0 % (ref 0.0–0.2)

## 2021-07-31 LAB — PREPARE RBC (CROSSMATCH)

## 2021-07-31 MED ORDER — POTASSIUM CHLORIDE 20 MEQ/15ML (10%) PO SOLN: 20.0000 meq | Freq: Two times a day (BID) | ORAL | 0 refills | Status: DC

## 2021-07-31 MED ORDER — HEPARIN SOD (PORK) LOCK FLUSH 100 UNIT/ML IV SOLN
250.0000 [IU] | INTRAVENOUS | Status: AC | PRN
  Administered 2021-07-31: 500 [IU]

## 2021-07-31 MED ORDER — SODIUM CHLORIDE 0.9% FLUSH
10.0000 mL | INTRAVENOUS | Status: AC | PRN
  Administered 2021-07-31: 10 mL

## 2021-07-31 MED ORDER — ACETAMINOPHEN 325 MG PO TABS
650.0000 mg | ORAL_TABLET | Freq: Once | ORAL | Status: AC
  Administered 2021-07-31: 650 mg via ORAL
  Filled 2021-07-31: qty 2

## 2021-07-31 MED ORDER — SODIUM CHLORIDE 0.9% IV SOLUTION
250.0000 mL | Freq: Once | INTRAVENOUS | Status: AC
  Administered 2021-07-31: 250 mL via INTRAVENOUS

## 2021-07-31 MED ORDER — SODIUM CHLORIDE 0.9 % IV SOLN: Freq: Once | INTRAVENOUS | Status: AC

## 2021-07-31 MED ORDER — POTASSIUM CHLORIDE 10 MEQ/100ML IV SOLN
10.0000 meq | INTRAVENOUS | Status: AC
  Administered 2021-07-31 (×2): 10 meq via INTRAVENOUS
  Filled 2021-07-31 (×2): qty 100

## 2021-07-31 MED ORDER — DIPHENHYDRAMINE HCL 25 MG PO CAPS
25.0000 mg | ORAL_CAPSULE | Freq: Once | ORAL | Status: AC
  Administered 2021-07-31: 25 mg via ORAL
  Filled 2021-07-31: qty 1

## 2021-07-31 NOTE — Patient Instructions (Signed)
Blood Transfusion, Adult, Care After This sheet gives you information about how to care for yourself after your procedure. Your doctor may also give you more specific instructions. If you have problems or questions, contact your doctor. What can I expect after the procedure? After the procedure, it is common to have: Bruising and soreness at the IV site. A headache. Follow these instructions at home: Insertion site care   Follow instructions from your doctor about how to take care of your insertion site. This is where an IV tube was put into your vein. Make sure you: Wash your hands with soap and water before and after you change your bandage (dressing). If you cannot use soap and water, use hand sanitizer. Change your bandage as told by your doctor. Check your insertion site every day for signs of infection. Check for: Redness, swelling, or pain. Bleeding from the site. Warmth. Pus or a bad smell. General instructions Take over-the-counter and prescription medicines only as told by your doctor. Rest as told by your doctor. Go back to your normal activities as told by your doctor. Keep all follow-up visits as told by your doctor. This is important. Contact a doctor if: You have itching or red, swollen areas of skin (hives). You feel worried or nervous (anxious). You feel weak after doing your normal activities. You have redness, swelling, warmth, or pain around the insertion site. You have blood coming from the insertion site, and the blood does not stop with pressure. You have pus or a bad smell coming from the insertion site. Get help right away if: You have signs of a serious reaction. This may be coming from an allergy or the body's defense system (immune system). Signs include: Trouble breathing or shortness of breath. Swelling of the face or feeling warm (flushed). Fever or chills. Head, chest, or back pain. Dark pee (urine) or blood in the pee. Widespread rash. Fast  heartbeat. Feeling dizzy or light-headed. You may receive your blood transfusion in an outpatient setting. If so, you will be told whom to contact to report any reactions. These symptoms may be an emergency. Do not wait to see if the symptoms will go away. Get medical help right away. Call your local emergency services (911 in the [place] Do not drive yourself to the hospital. Summary Bruising and soreness at the IV site are common. Check your insertion site every day for signs of infection. Rest as told by your doctor. Go back to your normal activities as told by your doctor. Get help right away if you have signs of a serious reaction. This information is not intended to replace advice given to you by your health care provider. Make sure you discuss any questions you have with your health care provider. Potassium Chloride Injection What is this medication? POTASSIUM CHLORIDE (poe TASS i um KLOOR ide) prevents and treats low levels of potassium in your body. Potassium plays an important role in maintaining the health of your kidneys, heart, muscles, and nervous system. This medicine may be used for other purposes; ask your health care provider or pharmacist if you have questions. COMMON BRAND NAME(S): PROAMP What should I tell my care team before I take this medication? They need to know if you have any of these conditions: Addison disease Dehydration Diabetes (high blood sugar) Heart disease High levels of potassium in the blood Irregular heartbeat or rhythm Kidney disease Large areas of burned skin An unusual or allergic reaction to potassium, other medications, foods, dyes, or  preservatives Pregnant or trying to get pregnant Breast-feeding How should I use this medication? This medication is injected into a vein. It is given in a hospital or clinic setting. Talk to your care team about the use of this medication in children. Special care may be needed. Overdosage: If you think you  have taken too much of this medicine contact a poison control center or emergency room at once. NOTE: This medicine is only for you. Do not share this medicine with others. What if I miss a dose? This does not apply. This medication is not for regular use. What may interact with this medication? Do not take this medication with any of the following: Certain diuretics such as spironolactone, triamterene Eplerenone Sodium polystyrene sulfonate This medication may also interact with the following: Certain medications for blood pressure or heart disease like lisinopril, losartan, quinapril, valsartan Medications that lower your chance of fighting infection such as cyclosporine, tacrolimus NSAIDs, medications for pain and inflammation, like ibuprofen or naproxen Other potassium supplements Salt substitutes This list may not describe all possible interactions. Give your health care provider a list of all the medicines, herbs, non-prescription drugs, or dietary supplements you use. Also tell them if you smoke, drink alcohol, or use illegal drugs. Some items may interact with your medicine. What should I watch for while using this medication? Visit your care team for regular checks on your progress. Tell your care team if your symptoms do not start to get better or if they get worse. You may need blood work while you are taking this medication. Avoid salt substitutes unless you are told otherwise by your care team. What side effects may I notice from receiving this medication? Side effects that you should report to your care team as soon as possible: Allergic reactions--skin rash, itching, hives, swelling of the face, lips, tongue, or throat High potassium level--muscle weakness, fast or irregular heartbeat Side effects that usually do not require medical attention (report to your care team if they continue or are bothersome): Diarrhea Nausea Stomach pain Vomiting This list may not describe all  possible side effects. Call your doctor for medical advice about side effects. You may report side effects to FDA at 1-800-FDA-1088. Where should I keep my medication? This medication is given in a hospital or clinic. It will not be stored at home. NOTE: This sheet is a summary. It may not cover all possible information. If you have questions about this medicine, talk to your doctor, pharmacist, or health care provider.  2022 Elsevier/Gold Standard (2020-10-31 00:00:00)  Document Revised: 11/09/2020 Document Reviewed: 01/07/2019 Elsevier Patient Education  [place].

## 2021-07-31 NOTE — Patient Instructions (Signed)
[employer], Adult A central line is a long, thin tube (catheter) that is put into a vein so that it goes to a large vein above your heart. It can be used to: Give you medicine or fluids. Give you food and nutrients. Take blood or give you blood for testing or treatments. Types of central lines There are four main types of central lines: Peripherally inserted central catheter (PICC) line. This type is usually put in the upper arm and goes up the arm to the heart. Tunneled central line. This type is placed in a large vein in the neck, chest, or groin. It is tunneled under the skin and brought out through a second incision. Non-tunneled central line. This type is used for a shorter time than other types, usually for 7 days at the most. It is inserted in the neck, chest, or groin. Implanted port. This type can stay in place longer than other types of central lines. It is normally put in the upper chest but can also be placed in the upper arm or the belly. Surgery is needed to put it in and take it out. The type of central line you get will depend on how long you need it and your medical condition. Tell a doctor about: Any allergies you have. All medicines you are taking. These include vitamins, herbs, eye drops, creams, and over-the-counter medicines. Any problems you or family members have had with anesthetic medicines. Any blood disorders you have. Any surgeries you have had. Any medical conditions you have. Whether you are pregnant or may be pregnant. What are the risks? Generally, central lines are safe. However, problems may occur, including: Infection. A blood clot. Bleeding from the place where the central line was inserted. Getting a hole or crack in the central line. If this happens, the central line will need to be replaced. Central line failure. The catheter moving or coming out of place. What happens before the procedure? Medicines Ask your doctor about changing or  stopping: Your normal medicines. Vitamins, herbs, and supplements. Over-the-counter medicines. Do not take aspirin or ibuprofen unless you are told to. General instructions Follow instructions from your doctor about eating or drinking. For your safety, your doctor may: Elta [place] the area of the procedure. Remove hair at the procedure site. Ask you to wash with a soap that kills germs. Plan to have a responsible adult take you home from the hospital or clinic. If you will be going home right after the procedure, plan to have a responsible adult care for you for the time you are told. This is important. What happens during the procedure? An IV tube will be put into one of your veins. You may be given: A sedative. This medicine helps you relax. Anesthetics. These medicines numb certain areas of your body. Your skin will be cleaned with a germ-killing (antiseptic) solution. You may be covered with clean drapes. Your blood pressure, heart rate, breathing rate, and blood oxygen level will be monitored during the procedure. The central line will be put into the vein and moved through it to the correct spot. The doctor may use X-ray equipment to help guide the central line to the right place. A bandage (dressing) will be placed over the insertion area. The procedure may vary among doctors and hospitals. What can I expect after the procedure? You will be monitored until you leave the hospital or clinic. This includes checking your blood pressure, heart rate, breathing rate, and blood oxygen level. Caps may  be placed on the ends of the central line tubing. If you were given a sedative during your procedure, do not drive or use machines until your doctor says that it is safe. Follow these instructions at home: Caring for the tube  Follow instructions from your doctor about: Flushing the tube. Cleaning the tube and the area around it. Only use germ-free (sterile) supplies to flush. The supplies  should be from your doctor, a pharmacy, or another place that your doctor recommends. Before you flush the tube or clean the area around the tube: Wash your hands with soap and water for at least 20 seconds. If you cannot use soap and water, use hand sanitizer. Clean the central line hub with rubbing alcohol. To do this: Scrub it using a twisting motion and rub for 10 to 15 seconds or for 30 twists. Follow the manufacturer's instructions. Be sure you scrub the top of the hub, not just the sides. Never reuse alcohol pads. Let the hub dry before use. Keep it from touching anything while drying. Caring for your skin Check the skin around the central line every day for signs of infection. Check for: Redness, swelling, or pain. Fluid or blood. Warmth. Pus or a bad smell. Keep the area where the tube was put in clean and dry. Change bandages only as told by your doctor. Keep your bandage dry. If a bandage gets wet, have it changed right away. General instructions Keep the tube clamped, unless it is being used. If you or someone else accidentally pulls on the tube, make sure: The bandage is okay. There is no bleeding. The tube has not been pulled out. Do not use scissors or sharp objects near the tube. Do not take baths, swim, or use a hot tub until your doctor says it is okay. Ask your doctor if you may take showers. You may only be allowed to take sponge baths. Ask your doctor what activities are safe for you. Your doctor may tell you not to lift anything or move your arm too much. Take over-the-counter and prescription medicines only as told by your doctor. Keep all follow-up visits. Storing and throwing away supplies Keep your supplies in a clean, dry location. Throw away any used syringes in a container that is only for sharp items (sharps container). You can buy a sharps container from a pharmacy, or you can make one by using an empty hard plastic bottle with a cover. Place any used  bandages or infusion bags into a plastic bag. Throw that bag in the trash. Contact a doctor if: You have any of these signs of infection where the tube was put in: Redness, swelling, or pain. Fluid or blood. Warmth. Pus or a bad smell. Get help right away if: You have: A fever or chills. Shortness of breath. Pain in your chest. A fast heartbeat. Swelling in your neck, face, chest, or arm. You feel dizzy or you faint. There are red lines coming from where the tube was put in. The area where the tube was put in is bleeding and the bleeding will not stop. Your tube is hard to flush. You do not get a blood return from the tube. The tube gets loose or comes out. The tube has a hole or a tear. The tube leaks. Summary A central line is a long, thin tube (catheter) that is put in your vein. It can be used to give you medicine, food, or fluids. Follow instructions from your doctor about flushing  and cleaning the tube. Keep the area where the tube was put in clean and dry. Ask your doctor what activities are safe for you. This information is not intended to replace advice given to you by your health care provider. Make sure you discuss any questions you have with your health care provider. Document Revised: 03/16/2020 Document Reviewed: 03/16/2020 Elsevier Patient Education  2022 [employer].

## 2021-07-31 NOTE — Telephone Encounter (Signed)
I contacted the pt regarding his lab results. Per Dr. Deanna Artis PA-C, pt needs 20mg  of IV K and 1 unit of blood.  We have arranged for pt to receive these services in [facility]. Pt is aware to arrive for his PF w/labs at 10am to allow time for BB to prep his blood. Pt also understands what/why he is receiving this.

## 2021-07-31 NOTE — Telephone Encounter (Signed)
CRITICAL VALUE STICKER  CRITICAL VALUE: hgb 6.5  RECEIVER (on-site recipient of call): Jon Lall  DATE & TIME NOTIFIED: 07/31/21 @ 1114  MESSENGER (representative from lab):Pam  MD NOTIFIED: [name]  TIME OF NOTIFICATION:1114  RESPONSE:  Today pt is receiving a blood transfusion in [facility].

## 2021-08-01 ENCOUNTER — Inpatient Hospital Stay (HOSPITAL_BASED_OUTPATIENT_CLINIC_OR_DEPARTMENT_OTHER): Payer: BC Managed Care – PPO | Admitting: Physician Assistant

## 2021-08-01 ENCOUNTER — Inpatient Hospital Stay: Payer: BC Managed Care – PPO | Admitting: Dietician

## 2021-08-01 ENCOUNTER — Inpatient Hospital Stay: Payer: BC Managed Care – PPO

## 2021-08-01 ENCOUNTER — Encounter: Payer: Self-pay | Admitting: Physician Assistant

## 2021-08-01 VITALS — BP 127/89 | HR 75 | Temp 97.7°F | Resp 16 | Ht 68.0 in | Wt 103.5 lb

## 2021-08-01 DIAGNOSIS — D6481 Anemia due to antineoplastic chemotherapy: Secondary | ICD-10-CM | POA: Diagnosis not present

## 2021-08-01 DIAGNOSIS — E876 Hypokalemia: Secondary | ICD-10-CM | POA: Diagnosis not present

## 2021-08-01 DIAGNOSIS — R630 Anorexia: Secondary | ICD-10-CM | POA: Diagnosis not present

## 2021-08-01 DIAGNOSIS — Z95828 Presence of other vascular implants and grafts: Secondary | ICD-10-CM

## 2021-08-01 DIAGNOSIS — C770 Secondary and unspecified malignant neoplasm of lymph nodes of head, face and neck: Secondary | ICD-10-CM | POA: Diagnosis not present

## 2021-08-01 DIAGNOSIS — D61818 Other pancytopenia: Secondary | ICD-10-CM | POA: Diagnosis not present

## 2021-08-01 DIAGNOSIS — C3491 Malignant neoplasm of unspecified part of right bronchus or lung: Secondary | ICD-10-CM | POA: Diagnosis not present

## 2021-08-01 DIAGNOSIS — R5383 Other fatigue: Secondary | ICD-10-CM | POA: Diagnosis not present

## 2021-08-01 DIAGNOSIS — R197 Diarrhea, unspecified: Secondary | ICD-10-CM | POA: Diagnosis not present

## 2021-08-01 DIAGNOSIS — Z5111 Encounter for antineoplastic chemotherapy: Secondary | ICD-10-CM | POA: Diagnosis not present

## 2021-08-01 DIAGNOSIS — C3411 Malignant neoplasm of upper lobe, right bronchus or lung: Secondary | ICD-10-CM | POA: Diagnosis not present

## 2021-08-01 DIAGNOSIS — T451X5A Adverse effect of antineoplastic and immunosuppressive drugs, initial encounter: Secondary | ICD-10-CM

## 2021-08-01 DIAGNOSIS — R112 Nausea with vomiting, unspecified: Secondary | ICD-10-CM | POA: Diagnosis not present

## 2021-08-01 LAB — CMP (CANCER CENTER ONLY)
ALT: 44 U/L (ref 0–44)
AST: 71 U/L — ABNORMAL HIGH (ref 15–41)
Albumin: 3 g/dL — ABNORMAL LOW (ref 3.5–5.0)
Alkaline Phosphatase: 127 U/L — ABNORMAL HIGH (ref 38–126)
Anion gap: 9 (ref 5–15)
BUN: 13 mg/dL (ref 8–23)
CO2: 27 mmol/L (ref 22–32)
Calcium: 8.4 mg/dL — ABNORMAL LOW (ref 8.9–10.3)
Chloride: 92 mmol/L — ABNORMAL LOW (ref 98–111)
Creatinine: 1.63 mg/dL — ABNORMAL HIGH (ref 0.61–1.24)
GFR, Estimated: 46 mL/min — ABNORMAL LOW (ref >60)
Glucose, Bld: 101 mg/dL — ABNORMAL HIGH (ref 70–99)
Potassium: 3.1 mmol/L — ABNORMAL LOW (ref 3.5–5.1)
Sodium: 128 mmol/L — ABNORMAL LOW (ref 135–145)
Total Bilirubin: 0.7 mg/dL (ref 0.3–1.2)
Total Protein: 6.6 g/dL (ref 6.5–8.1)

## 2021-08-01 LAB — CBC WITH DIFFERENTIAL (CANCER CENTER ONLY)
Abs Immature Granulocytes: 0.03 10*3/uL (ref 0.00–0.07)
Basophils Absolute: 0 10*3/uL (ref 0.0–0.1)
Basophils Relative: 0 %
Eosinophils Absolute: 0 10*3/uL (ref 0.0–0.5)
Eosinophils Relative: 0 %
HCT: 24.8 % — ABNORMAL LOW (ref 39.0–52.0)
Hemoglobin: 9.1 g/dL — ABNORMAL LOW (ref 13.0–17.0)
Immature Granulocytes: 1 %
Lymphocytes Relative: 39 %
Lymphs Abs: 2.2 10*3/uL (ref 0.7–4.0)
MCH: 32.2 pg (ref 26.0–34.0)
MCHC: 36.7 g/dL — ABNORMAL HIGH (ref 30.0–36.0)
MCV: 87.6 fL (ref 80.0–100.0)
Monocytes Absolute: 0.8 10*3/uL (ref 0.1–1.0)
Monocytes Relative: 14 %
Neutro Abs: 2.6 10*3/uL (ref 1.7–7.7)
Neutrophils Relative %: 46 %
Platelet Count: 100 10*3/uL — ABNORMAL LOW (ref 150–400)
RBC: 2.83 MIL/uL — ABNORMAL LOW (ref 4.22–5.81)
RDW: 17.9 % — ABNORMAL HIGH (ref 11.5–15.5)
WBC Count: 5.6 10*3/uL (ref 4.0–10.5)
nRBC: 0 % (ref 0.0–0.2)

## 2021-08-01 MED ORDER — SODIUM CHLORIDE 0.9% FLUSH
10.0000 mL | Freq: Once | INTRAVENOUS | Status: AC
  Administered 2021-08-01: 10 mL

## 2021-08-01 MED ORDER — HEPARIN SOD (PORK) LOCK FLUSH 100 UNIT/ML IV SOLN
500.0000 [IU] | Freq: Once | INTRAVENOUS | Status: AC
  Administered 2021-08-01: 500 [IU]

## 2021-08-01 NOTE — Progress Notes (Signed)
Per cassie Heilingoepter, PA, no treatment, port flushed and de-accessed.

## 2021-08-01 NOTE — Patient Instructions (Signed)

## 2021-08-04 LAB — TYPE AND SCREEN
ABO/RH(D): A POS
Antibody Screen: NEGATIVE
Unit division: 0
Unit division: 0

## 2021-08-04 LAB — BPAM RBC
Blood Product Expiration Date: 202301252359
Blood Product Expiration Date: 202301252359
ISSUE DATE / TIME: 202301031424
Unit Type and Rh: 6200
Unit Type and Rh: 6200

## 2021-08-08 ENCOUNTER — Other Ambulatory Visit: Payer: BC Managed Care – PPO

## 2021-08-13 ENCOUNTER — Telehealth: Payer: Self-pay | Admitting: Internal Medicine

## 2021-08-13 NOTE — Telephone Encounter (Signed)
Sch per 1/4 los, pt aware

## 2021-08-15 ENCOUNTER — Telehealth: Payer: Self-pay | Admitting: *Deleted

## 2021-08-15 ENCOUNTER — Ambulatory Visit: Payer: BC Managed Care – PPO | Admitting: Internal Medicine

## 2021-08-15 ENCOUNTER — Ambulatory Visit: Payer: BC Managed Care – PPO

## 2021-08-15 ENCOUNTER — Other Ambulatory Visit: Payer: BC Managed Care – PPO

## 2021-08-15 ENCOUNTER — Other Ambulatory Visit: Payer: Self-pay

## 2021-08-15 ENCOUNTER — Inpatient Hospital Stay: Payer: BC Managed Care – PPO

## 2021-08-15 DIAGNOSIS — I739 Peripheral vascular disease, unspecified: Secondary | ICD-10-CM

## 2021-08-15 NOTE — Telephone Encounter (Signed)
Connected with collaborative regarding [place] "Authorization to Obtain and Release Information (Excluding Psychotherapy notes)" without attending physician statement (APS) or other claim forms.  Received via e-mail; signed by patient 08/14/2021.  Collaborative "spoke with patient yesterday.  Provided e-mail address to send disability form he said he is unable to bring in".  Connected with Sean Rose [phone] (home)  asking if any other form(s) should be attached to single page authorization.    "My company advised me to move to long-term disability.  Company wants provider input to determine if I qualify.  Hold onto this request when they contact you for additional information.  I am not sure if they will call, e-mail or what format request for long-term disability information will occur but this is my permission to release whatever they need.  If I need to sign a [place] release, I will."

## 2021-08-16 NOTE — Telephone Encounter (Signed)
Today at 1545, [facility] [facility] staff received form.

## 2021-08-19 ENCOUNTER — Other Ambulatory Visit: Payer: Self-pay | Admitting: Internal Medicine

## 2021-08-20 ENCOUNTER — Encounter: Payer: Self-pay | Admitting: Internal Medicine

## 2021-08-21 ENCOUNTER — Ambulatory Visit (HOSPITAL_COMMUNITY): Payer: BC Managed Care – PPO

## 2021-08-21 ENCOUNTER — Ambulatory Visit: Payer: BC Managed Care – PPO | Admitting: Cardiovascular Disease

## 2021-08-22 ENCOUNTER — Ambulatory Visit: Payer: BC Managed Care – PPO | Admitting: Physician Assistant

## 2021-08-22 ENCOUNTER — Other Ambulatory Visit: Payer: BC Managed Care – PPO

## 2021-08-22 ENCOUNTER — Ambulatory Visit: Payer: BC Managed Care – PPO

## 2021-08-23 ENCOUNTER — Other Ambulatory Visit: Payer: Self-pay | Admitting: Internal Medicine

## 2021-08-28 ENCOUNTER — Telehealth: Payer: Self-pay

## 2021-08-28 ENCOUNTER — Telehealth: Payer: Self-pay | Admitting: Medical Oncology

## 2021-08-28 NOTE — Telephone Encounter (Signed)
Returned a call from a voicemail received from Patient. Patient states that his work is telling him that he has to go on disability and that the claim will be dropped in March if medical records are not received. Patient states that he signed a Release of Information form and sent it to the [facility]. Signed [employer] Release of Information Form received on 08/16/21 attached to a Cancer Information Form. Letter requesting medical records received from [place] on 08/16/21 addressed to Individual Provider with Claim Number [id number]. Explained to Patient that letter received from [place] must be addressed to [facility] per [place] Information Management requirements. Explained to Patient that medical records are released from the [place] Management Department. Patient became upset. "How is a Patient supposed to know this information?" "If my claim is rejected because records were not received, someone is going to be in some serious trouble." Attempted to calm Patient and offered to assist with calling [place] for him. Patient declined and requested phone number for [place] Management Office. Information provided to Patient.

## 2021-08-28 NOTE — Telephone Encounter (Signed)
Work is telling him he needs to go on disability .   He sent release form . Pt says Shawna [employer] has not received any med records. This needs to be done by March.   Transferred his VM to [place].

## 2021-08-28 NOTE — Telephone Encounter (Signed)
.  err duplicate message

## 2021-08-29 ENCOUNTER — Other Ambulatory Visit: Payer: BC Managed Care – PPO

## 2021-08-29 ENCOUNTER — Encounter: Payer: Self-pay | Admitting: Internal Medicine

## 2021-09-05 ENCOUNTER — Other Ambulatory Visit: Payer: BC Managed Care – PPO

## 2021-09-05 ENCOUNTER — Ambulatory Visit: Payer: BC Managed Care – PPO

## 2021-09-05 ENCOUNTER — Ambulatory Visit: Payer: BC Managed Care – PPO | Admitting: Physician Assistant

## 2021-09-07 ENCOUNTER — Telehealth: Payer: Self-pay | Admitting: *Deleted

## 2021-09-07 NOTE — Telephone Encounter (Addendum)
[place] form successfully returned via fax [phone]).  Original copy to alphabetical file folder behind appointment registration area one for patient pick up upon next scheduled appointment 09/12/2021.Marland Kitchen   [place] request "Office Notes test results, prescription histories and treatment plans" from 07/19/2021 to present   Copy to "Record Release" bin in front office area before [place] for [place].I.M. staff to forward to [facility] ([place]) Information Management Office, Phone: [phone], Fax: [phone] to complete.

## 2021-09-12 ENCOUNTER — Inpatient Hospital Stay: Payer: BC Managed Care – PPO | Attending: Internal Medicine

## 2021-09-12 ENCOUNTER — Other Ambulatory Visit: Payer: BC Managed Care – PPO

## 2021-09-12 ENCOUNTER — Other Ambulatory Visit: Payer: Self-pay

## 2021-09-12 DIAGNOSIS — C3411 Malignant neoplasm of upper lobe, right bronchus or lung: Secondary | ICD-10-CM | POA: Diagnosis not present

## 2021-09-12 DIAGNOSIS — Z95828 Presence of other vascular implants and grafts: Secondary | ICD-10-CM

## 2021-09-12 DIAGNOSIS — Z452 Encounter for adjustment and management of vascular access device: Secondary | ICD-10-CM | POA: Diagnosis not present

## 2021-09-12 MED ORDER — HEPARIN SOD (PORK) LOCK FLUSH 100 UNIT/ML IV SOLN
500.0000 [IU] | Freq: Once | INTRAVENOUS | Status: AC
  Administered 2021-09-12: 500 [IU]

## 2021-09-12 MED ORDER — SODIUM CHLORIDE 0.9% FLUSH
10.0000 mL | Freq: Once | INTRAVENOUS | Status: AC
  Administered 2021-09-12: 10 mL

## 2021-09-17 ENCOUNTER — Telehealth: Payer: Self-pay

## 2021-09-17 NOTE — Telephone Encounter (Signed)
Pt LM stating he needs assistance with getting his records sent for his disability paperwork.  I consulted with Starla Link, RN in the forms department who advised that a [place] release form is needed to records to be released.  I have called the pt back who advised he completed, signed and returned this form to [facility] on 09/07/21 for records dated 04/28/22 through 09/07/21. Pt states when he called HIM he was advised there are multiple locations and called the wrong one but did not help him get to the correct one.  I reviewed pts chart and see no signed and completed [place] form having been received from [facility] and advised the pt I suspect, since he emailed Shawna Orleans the form on Friday around 4pm, they likely did not see it until Monday 09/17/21 morning but want to confirm with [place] [facility] that no form has been received.  I have conference the pt in with Colletta Maryland located in the [place] and she confirm no [facility] HIPAA release form has been received thus far but advised the pt that she can email the pt his records and he can then provide them to [facility] directly. Pt provided Colletta Maryland with his work email of [email or web address].  I followed up with the pt this evening to confirmed he received [place] email just to close the loop and he advised he has not but we both suspect it will take awhile for the email to clear [facility] security and his employer security. We agreed to checkin with each other in the morning 09/18/21 before we contact 10/01/21 again. Pt states if he does not receive it by tomorrow, then he would like her to email the records to his personal email address of [email or web address].

## 2021-09-19 ENCOUNTER — Other Ambulatory Visit: Payer: BC Managed Care – PPO

## 2021-09-22 ENCOUNTER — Other Ambulatory Visit: Payer: Self-pay | Admitting: Cardiovascular Disease

## 2021-10-17 ENCOUNTER — Ambulatory Visit (HOSPITAL_COMMUNITY): Payer: BC Managed Care – PPO

## 2021-10-17 ENCOUNTER — Ambulatory Visit: Payer: BC Managed Care – PPO | Admitting: Cardiovascular Disease

## 2021-10-22 ENCOUNTER — Other Ambulatory Visit: Payer: Self-pay | Admitting: Physician Assistant

## 2021-10-22 ENCOUNTER — Telehealth: Payer: Self-pay | Admitting: Physician Assistant

## 2021-10-22 ENCOUNTER — Ambulatory Visit (HOSPITAL_COMMUNITY)
Admission: RE | Admit: 2021-10-22 | Discharge: 2021-10-22 | Disposition: A | Discharge status: Home / Self Care | Payer: BC Managed Care – PPO | Source: Ambulatory Visit | Attending: Physician Assistant | Admitting: Physician Assistant

## 2021-10-22 ENCOUNTER — Other Ambulatory Visit: Payer: Self-pay

## 2021-10-22 DIAGNOSIS — C3491 Malignant neoplasm of unspecified part of right bronchus or lung: Secondary | ICD-10-CM | POA: Diagnosis not present

## 2021-10-22 DIAGNOSIS — C3490 Malignant neoplasm of unspecified part of unspecified bronchus or lung: Secondary | ICD-10-CM | POA: Diagnosis not present

## 2021-10-22 DIAGNOSIS — N2889 Other specified disorders of kidney and ureter: Secondary | ICD-10-CM | POA: Diagnosis not present

## 2021-10-22 DIAGNOSIS — J432 Centrilobular emphysema: Secondary | ICD-10-CM | POA: Diagnosis not present

## 2021-10-22 DIAGNOSIS — J90 Pleural effusion, not elsewhere classified: Secondary | ICD-10-CM | POA: Diagnosis not present

## 2021-10-22 DIAGNOSIS — J984 Other disorders of lung: Secondary | ICD-10-CM | POA: Diagnosis not present

## 2021-10-22 DIAGNOSIS — K3189 Other diseases of stomach and duodenum: Secondary | ICD-10-CM | POA: Diagnosis not present

## 2021-10-22 LAB — POCT I-STAT CREATININE: Creatinine, Ser: 2.2 mg/dL — ABNORMAL HIGH (ref 0.61–1.24)

## 2021-10-22 NOTE — Telephone Encounter (Signed)
The patient was getting a CT scan today.  His creatinine is elevated at 2.2.  I called the patient and left a voicemail.  I let him know that his creatinine was elevated and was calling to see if he would had any recent nausea vomiting, or diarrhea.  I encouraged him to hydrate and we will keep his lab appointment for Wednesday as scheduled to recheck his kidney function at that time.  If the patient had any questions about these instructions, I encouraged him to give [place] a call back. ?

## 2021-10-23 ENCOUNTER — Telehealth: Payer: Self-pay | Admitting: Pulmonary Disease

## 2021-10-23 NOTE — Telephone Encounter (Signed)
Lm for lisa.  ?

## 2021-10-23 NOTE — Telephone Encounter (Signed)
Lm for patient's spouse, [name].  ?

## 2021-10-24 ENCOUNTER — Inpatient Hospital Stay: Payer: BC Managed Care – PPO | Attending: Internal Medicine

## 2021-10-24 ENCOUNTER — Other Ambulatory Visit: Payer: Self-pay

## 2021-10-24 DIAGNOSIS — C3411 Malignant neoplasm of upper lobe, right bronchus or lung: Secondary | ICD-10-CM | POA: Insufficient documentation

## 2021-10-24 DIAGNOSIS — Z923 Personal history of irradiation: Secondary | ICD-10-CM | POA: Diagnosis not present

## 2021-10-24 DIAGNOSIS — Z9221 Personal history of antineoplastic chemotherapy: Secondary | ICD-10-CM | POA: Insufficient documentation

## 2021-10-24 DIAGNOSIS — C7951 Secondary malignant neoplasm of bone: Secondary | ICD-10-CM | POA: Insufficient documentation

## 2021-10-24 DIAGNOSIS — C3491 Malignant neoplasm of unspecified part of right bronchus or lung: Secondary | ICD-10-CM

## 2021-10-24 DIAGNOSIS — D6481 Anemia due to antineoplastic chemotherapy: Secondary | ICD-10-CM | POA: Diagnosis not present

## 2021-10-24 DIAGNOSIS — E876 Hypokalemia: Secondary | ICD-10-CM

## 2021-10-24 DIAGNOSIS — Z95828 Presence of other vascular implants and grafts: Secondary | ICD-10-CM

## 2021-10-24 DIAGNOSIS — C770 Secondary and unspecified malignant neoplasm of lymph nodes of head, face and neck: Secondary | ICD-10-CM | POA: Insufficient documentation

## 2021-10-24 LAB — SAMPLE TO BLOOD BANK

## 2021-10-24 LAB — CBC WITH DIFFERENTIAL (CANCER CENTER ONLY)
Abs Immature Granulocytes: 0.02 10*3/uL (ref 0.00–0.07)
Basophils Absolute: 0 10*3/uL (ref 0.0–0.1)
Basophils Relative: 1 %
Eosinophils Absolute: 0.3 10*3/uL (ref 0.0–0.5)
Eosinophils Relative: 4 %
HCT: 26.3 % — ABNORMAL LOW (ref 39.0–52.0)
Hemoglobin: 9.6 g/dL — ABNORMAL LOW (ref 13.0–17.0)
Immature Granulocytes: 0 %
Lymphocytes Relative: 28 %
Lymphs Abs: 2.3 10*3/uL (ref 0.7–4.0)
MCH: 36.2 pg — ABNORMAL HIGH (ref 26.0–34.0)
MCHC: 36.5 g/dL — ABNORMAL HIGH (ref 30.0–36.0)
MCV: 99.2 fL (ref 80.0–100.0)
Monocytes Absolute: 0.7 10*3/uL (ref 0.1–1.0)
Monocytes Relative: 8 %
Neutro Abs: 5 10*3/uL (ref 1.7–7.7)
Neutrophils Relative %: 59 %
Platelet Count: 402 10*3/uL — ABNORMAL HIGH (ref 150–400)
RBC: 2.65 MIL/uL — ABNORMAL LOW (ref 4.22–5.81)
RDW: 13.5 % (ref 11.5–15.5)
WBC Count: 8.3 10*3/uL (ref 4.0–10.5)
nRBC: 0 % (ref 0.0–0.2)

## 2021-10-24 LAB — CMP (CANCER CENTER ONLY)
ALT: 16 U/L (ref 0–44)
AST: 25 U/L (ref 15–41)
Albumin: 2.9 g/dL — ABNORMAL LOW (ref 3.5–5.0)
Alkaline Phosphatase: 111 U/L (ref 38–126)
Anion gap: 6 (ref 5–15)
BUN: 22 mg/dL (ref 8–23)
CO2: 26 mmol/L (ref 22–32)
Calcium: 8.5 mg/dL — ABNORMAL LOW (ref 8.9–10.3)
Chloride: 95 mmol/L — ABNORMAL LOW (ref 98–111)
Creatinine: 1.74 mg/dL — ABNORMAL HIGH (ref 0.61–1.24)
GFR, Estimated: 43 mL/min — ABNORMAL LOW (ref >60)
Glucose, Bld: 119 mg/dL — ABNORMAL HIGH (ref 70–99)
Potassium: 3.8 mmol/L (ref 3.5–5.1)
Sodium: 127 mmol/L — ABNORMAL LOW (ref 135–145)
Total Bilirubin: 0.3 mg/dL (ref 0.3–1.2)
Total Protein: 7 g/dL (ref 6.5–8.1)

## 2021-10-24 MED ORDER — SODIUM CHLORIDE 0.9% FLUSH
10.0000 mL | Freq: Once | INTRAVENOUS | Status: AC
  Administered 2021-10-24: 10 mL

## 2021-10-24 MED ORDER — TRELEGY ELLIPTA 100-62.5-25 MCG/ACT IN AEPB: 1.0000 | INHALATION_SPRAY | Freq: Every day | RESPIRATORY_TRACT | 3 refills | Status: DC

## 2021-10-24 MED ORDER — HEPARIN SOD (PORK) LOCK FLUSH 100 UNIT/ML IV SOLN
500.0000 [IU] | Freq: Once | INTRAVENOUS | Status: AC
  Administered 2021-10-24: 500 [IU]

## 2021-10-24 NOTE — Telephone Encounter (Signed)
Trelegy sent to preferred pharmacy. ?Patient's spouse, [name](DPR) is aware and voiced her understanding.  ?Nothing further needed.  ? ?

## 2021-10-31 ENCOUNTER — Inpatient Hospital Stay: Payer: BC Managed Care – PPO | Attending: Internal Medicine | Admitting: Internal Medicine

## 2021-10-31 ENCOUNTER — Other Ambulatory Visit: Payer: Self-pay

## 2021-10-31 ENCOUNTER — Other Ambulatory Visit: Payer: Self-pay | Admitting: *Deleted

## 2021-10-31 ENCOUNTER — Encounter: Payer: Self-pay | Admitting: Internal Medicine

## 2021-10-31 ENCOUNTER — Encounter: Payer: Self-pay | Admitting: *Deleted

## 2021-10-31 VITALS — BP 141/85 | HR 71 | Temp 97.5°F | Resp 18 | Ht 68.0 in | Wt 106.9 lb

## 2021-10-31 DIAGNOSIS — C770 Secondary and unspecified malignant neoplasm of lymph nodes of head, face and neck: Secondary | ICD-10-CM | POA: Insufficient documentation

## 2021-10-31 DIAGNOSIS — C3490 Malignant neoplasm of unspecified part of unspecified bronchus or lung: Secondary | ICD-10-CM

## 2021-10-31 DIAGNOSIS — D649 Anemia, unspecified: Secondary | ICD-10-CM | POA: Insufficient documentation

## 2021-10-31 DIAGNOSIS — K2960 Other gastritis without bleeding: Secondary | ICD-10-CM | POA: Diagnosis not present

## 2021-10-31 DIAGNOSIS — C7951 Secondary malignant neoplasm of bone: Secondary | ICD-10-CM | POA: Insufficient documentation

## 2021-10-31 DIAGNOSIS — C3411 Malignant neoplasm of upper lobe, right bronchus or lung: Secondary | ICD-10-CM | POA: Insufficient documentation

## 2021-10-31 DIAGNOSIS — C3491 Malignant neoplasm of unspecified part of right bronchus or lung: Secondary | ICD-10-CM

## 2021-10-31 NOTE — Progress Notes (Signed)
?    [place] ?[phone]   [phone] ? ?OFFICE PROGRESS NOTE ? ?[name], NP ?[place] ?[place] [address] ? ?DIAGNOSIS:  Metastatic non-small cell lung cancer initially diagnosed as stage IIIA (T3, N1, M0) non-small cell lung cancer, adenocarcinoma with no actionable mutations presented with multiple pulmonary nodules in the right upper lobe as well as metastatic disease in intraparenchymal lymphadenopathy.  This was diagnosed in February 2020.  The patient has disease recurrence in March 2022. ?PD-L1 expression 20%. ?He has no actionable mutations by foundation 1. ?  ?PRIOR THERAPY: ?1) Status post right upper lobectomy with lymph node dissection. ?2) Adjuvant systemic chemotherapy with cisplatin 75 mg/M2 and Alimta 500 mg/M2 every 3 weeks.  Status post 4 cycles. ?3) palliative radiotherapy to the metastatic bone disease and the scapula as well as the left supraclavicular area under the care of Dr. Isidore Moos ?  ?CURRENT THERAPY: Systemic chemotherapy with carboplatin for AUC of 5, Alimta 500 mg/M2 and Keytruda 200 mg IV every 3 weeks.  First dose November 06, 2020.  Status post 12 cycles.  Starting from cycle #5, the patient will be treated with maintenance Keytruda and Alimta IV every 3 weeks. Dose reduced to Alimta 400 mg/m2 starting from cycle #11 because of worsening anemia.  Keytruda discontinued from cycle #11 due to possible checkpoint inhibitor toxicity of the stomach. ? ?INTERVAL HISTORY: ?Sean Rose 66 y.o. male returns to the clinic today for follow-up visit accompanied by his wife.  The patient continues to complain of increasing fatigue and weakness as well as lack of energy and stamina.  He went to exercise but he does not exercise.  He wants to gain more weight and he eats a little bit better than before.  He was seen by the dietitian at the cancer center in the past and trying to follow him some of their recommendation.  He gained few  pounds since his last visit.  He denied having any current chest pain, shortness of breath, cough or hemoptysis.  He has no current nausea, vomiting, diarrhea or constipation.  He has no headache or visual changes.  He has been in observation for the last few months.  He is here today for evaluation and repeat CT scan of the chest, abdomen and pelvis for restaging of his disease. ? ?MEDICAL HISTORY: ?Past Medical History:  ?Diagnosis Date  ? Allergy   ? Anemia   ? Anginal pain ([place])   ? Blood transfusion without reported diagnosis   ? CAD (coronary artery disease)   ? CAP (community acquired pneumonia) 09/2016  ? COPD (chronic obstructive pulmonary disease) ([place])   ? Emphysema of lung ([place])   ? GERD (gastroesophageal reflux disease)   ? Heart murmur   ? "I was told I had one when I was a kid"  ? Hemorrhoids   ? History of anal fissures   ? "no surgeries" (10/30/2016)  ? Hyperlipidemia   ? Hypertension   ? lung ca dx'd 08/2018  ? with mets to bones in arms  ? Myocardial infarction [facility])   ? Peripheral arterial disease ([place])   ? status post right common iliac artery stenting back in 2007  ? Seasonal allergies   ? Tobacco abuse   ? ? ?ALLERGIES:  is allergic to compazine [prochlorperazine]. ? ?MEDICATIONS:  ?Current Outpatient Medications  ?Medication Sig Dispense Refill  ? albuterol (PROVENTIL) (2.5 MG/3ML) 0.083% nebulizer solution Take 3 mLs (2.5 mg total) by nebulization  every 6 (six) hours as needed for wheezing or shortness of breath. 75 mL 12  ? amLODipine (NORVASC) 5 MG tablet Take 1 tablet (5 mg total) by mouth daily. 180 tablet 3  ? aspirin EC 81 MG tablet Take 81 mg by mouth daily.    ? clopidogrel (PLAVIX) 75 MG tablet TAKE 1 TABLET BY MOUTH EVERY DAY 90 tablet 3  ? esomeprazole (NEXIUM) 20 MG capsule Take 40 mg by mouth daily.    ? fexofenadine (ALLEGRA) 60 MG tablet Take 60 mg by mouth 2 (two) times daily.    ? Fluticasone-Umeclidin-Vilant (TRELEGY ELLIPTA) 100-62.5-25 MCG/ACT AEPB Inhale 1 puff into the  lungs daily. 60 each 3  ? folic acid (FOLVITE) 1 MG tablet TAKE 1 TABLET(1 MG) BY MOUTH DAILY 30 tablet 4  ? lidocaine (XYLOCAINE) 2 % solution Use as directed 15 mLs in the mouth or throat as needed for mouth pain. 100 mL 0  ? lidocaine-prilocaine (EMLA) cream Apply to the Port-A-Cath site 30-60 minutes before treatment. 30 g 0  ? Magnesium 500 MG TABS Take 1 tablet (500 mg total) by mouth 3 (three) times daily. 8 tablet 0  ? metoprolol succinate (TOPROL XL) 25 MG 24 hr tablet Take 1 tablet (25 mg total) by mouth daily. 90 tablet 3  ? Multiple Vitamin (MULTIVITAMIN WITH MINERALS) TABS tablet Take 1 tablet by mouth daily.    ? nitroGLYCERIN (NITROSTAT) 0.4 MG SL tablet PLACE 1 TABLET UNDER THE TONGUE EVERY 5 MINUTES AS NEEDED FOR CHEST PAIN 25 tablet 6  ? ondansetron (ZOFRAN) 8 MG tablet TAKE 1 TABLET(8 MG) BY MOUTH EVERY 8 HOURS AS NEEDED FOR NAUSEA OR VOMITING 20 tablet 0  ? potassium chloride 20 MEQ/15ML (10%) SOLN Take 15 mLs (20 mEq total) by mouth 2 (two) times daily. 300 mL 0  ? promethazine (PHENERGAN) 12.5 MG tablet Take 1 tablet (12.5 mg total) by mouth every 6 (six) hours as needed for nausea or vomiting. 30 tablet 0  ? rosuvastatin (CRESTOR) 5 MG tablet TAKE 1 TABLET(5 MG) BY MOUTH EVERY OTHER DAY 45 tablet 3  ? triamcinolone cream (KENALOG) 0.1 % Apply 1 application topically 2 (two) times daily. 30 g 1  ? umeclidinium-vilanterol (ANORO ELLIPTA) 62.5-25 MCG/INH AEPB Inhale 1 puff into the lungs daily. 60 each 5  ? ?No current facility-administered medications for this visit.  ? ?Facility-Administered Medications Ordered in Other Visits  ?Medication Dose Route Frequency Provider Last Rate Last Admin  ? diphenhydrAMINE (BENADRYL) 25 mg capsule           ? ? ?SURGICAL HISTORY:  ?Past Surgical History:  ?Procedure Laterality Date  ? ANKLE SURGERY Left   ? "rebuilt it"  ? ANTERIOR CRUCIATE LIGAMENT REPAIR Right   ? CARDIAC CATHETERIZATION  09/23/2008  ? Continued medical therapy - may need GI evaluation in  addition.  ? CARDIAC CATHETERIZATION  10/28/2007  ? Medical therapy recommended.  ? CARDIAC CATHETERIZATION  11/18/2006  ? In-stent restenosis RCA  (50% distal edge, 80% segmental mid, and 50-60% segmental proximal). Successful cutting balloon atherectomy using a [id number] cutting balloon. 3 inflations with atherectomy performed on mid and proximal portions resulting in reduction of 80% mid in-stent restenosis to less than 20% residual and 50-60% segmental proximal to less than 20% residual without dissection.  ? CARDIAC CATHETERIZATION  02/26/2006  ? Severe stenosis in RCA. Stenting performed using IVUS. 3.5x20 Ma[place]ick balloon deployed at [employer]. Distal stent-a 4x28 Liberte stent-deployed 12atm 48sec, 12atm 31sec, 4atm 19sec. Mid stent-a  4x28 Liberte stent-deployed 14atm 45sec, 14atm 60sec, 14atm 44sec. Proximal stent-4x8 Liberte- 14atm 45sec,14atm 47sec, 16atm 43sec. Severely diseased segment then appeared TIMI-3 flow.  ? CARDIOVASCULAR STRESS TEST  11/17/2012  ? No significant ECG changes. Septal perfusion defect is new when complared to study from 2010. Abnormal myocardial perfusion imaging with a basal to mid perfusion suggestive of previous MI.  ? CAROTID DOPPLER  08/09/2011  ? Bilateral Bulb/Proximal ICA - demonstrated a mild amount of fibrous plaque without evidence of significant diameter reduction reduction or other vascular abnormality.  ? CHEST TUBE INSERTION Right 09/02/2018  ? Procedure: Chest Tube Insertion;  Surgeon: Garner Nash, DO;  Location: [facility];  Service: Thoracic;  Laterality: Right;  ? COLONOSCOPY    ? 2003, 2014  ? CORONARY ANGIOPLASTY WITH STENT PLACEMENT    ? FEMORAL ARTERY STENT    ? INGUINAL HERNIA REPAIR Right   ? IR IMAGING GUIDED PORT INSERTION  11/13/2020  ? KNEE ARTHROSCOPY Right "multiple"  ? LAPAROSCOPIC APPENDECTOMY N/A 12/07/2018  ? Procedure: APPENDECTOMY LAPAROSCOPIC;  Surgeon: Coralie Keens, MD;  Location: [facility];  Service: General;  Laterality: N/A;  ? LOWER EXTREMITY  ARTERIAL DOPPLER  01/31/2011  ? Bilateral ABIs-normal values with no suggestion of arterial insuff to the lower extremities at rest. Right CIA stent-mild amount of nonhemodynamically significant plaque is noted thro

## 2021-11-06 ENCOUNTER — Telehealth: Payer: Self-pay | Admitting: Dietician

## 2021-11-06 ENCOUNTER — Inpatient Hospital Stay: Payer: BC Managed Care – PPO | Admitting: Dietician

## 2021-11-06 NOTE — Telephone Encounter (Signed)
Nutrition Follow-up: ? ?Patient with recurrent NSCLC. He is currently on break from treatment.  ? ?Spoke with patient via telephone. He reports eating better and maintaining weights 105-108 lbs. Patient reports he has been unable to gain additional weight. His goal is 120 lb by the end of May. He has been eating 3 meals daily and milkshakes a few times a week. For breakfast he eats bowl of oatmeal with half of banana. Patient has beef vegetable soup with crackers and few slices of cheese or chicken dumplings for lunch. Occasionally will have hamburger/country style steak from a local restaurant. Dinner meals vary, but typically meat, starch, vegetable.  ?He joined [employer] over the weekend. Patient is planning to participate in low impact cardio strength training. His goal working his way up to 3 days a week. Patient is looking forward to being outside, hoping to do some fishing from his kayak by the end of the summer. Denies nausea, vomiting, diarrhea, constipation ? ? ?Medications: reviewed ? ?Labs: 3/29 - glucose 115, Na 127, Cr 1.74 ? ?Anthropometrics: Last weight 106 lb 14.4 oz on 4/5 increased  ? ?1/04 - 103 lb 8 oz  ? ? ?NUTRITION DIAGNOSIS: Severe malnutrition likely ongoing  ? ? ? ?INTERVENTION:  ?Reviewed strategies for increasing calories and protein  ?Recommend high protein snacks in between meals, ideas provided ?Boost/Ensure reported to give pt heartburn, suggested trying CIB as alternate supplement ?Recommend CIB or shake daily - will e-mail shake recipes per pt request ?Support and encouragement provided  ?  ? ?MONITORING, EVALUATION, GOAL: weight trends, intake  ? ? ?NEXT VISIT: Wednesday May 10 before port flush ? ? ? ?

## 2021-11-06 NOTE — Progress Notes (Signed)
See telephone note.

## 2021-11-07 ENCOUNTER — Emergency Department (HOSPITAL_COMMUNITY)
Admission: EM | Admit: 2021-11-07 | Discharge: 2021-11-07 | Disposition: A | Discharge status: Home / Self Care | Payer: BC Managed Care – PPO | Attending: Emergency Medicine | Admitting: Emergency Medicine

## 2021-11-07 ENCOUNTER — Emergency Department (HOSPITAL_COMMUNITY): Payer: BC Managed Care – PPO

## 2021-11-07 ENCOUNTER — Other Ambulatory Visit: Payer: Self-pay

## 2021-11-07 ENCOUNTER — Encounter (HOSPITAL_COMMUNITY): Payer: Self-pay

## 2021-11-07 DIAGNOSIS — W19XXXA Unspecified fall, initial encounter: Secondary | ICD-10-CM | POA: Diagnosis not present

## 2021-11-07 DIAGNOSIS — R55 Syncope and collapse: Secondary | ICD-10-CM | POA: Diagnosis not present

## 2021-11-07 DIAGNOSIS — M7989 Other specified soft tissue disorders: Secondary | ICD-10-CM | POA: Diagnosis not present

## 2021-11-07 DIAGNOSIS — J449 Chronic obstructive pulmonary disease, unspecified: Secondary | ICD-10-CM | POA: Diagnosis not present

## 2021-11-07 DIAGNOSIS — Y9289 Other specified places as the place of occurrence of the external cause: Secondary | ICD-10-CM | POA: Insufficient documentation

## 2021-11-07 DIAGNOSIS — Z85118 Personal history of other malignant neoplasm of bronchus and lung: Secondary | ICD-10-CM | POA: Insufficient documentation

## 2021-11-07 DIAGNOSIS — G4489 Other headache syndrome: Secondary | ICD-10-CM | POA: Diagnosis not present

## 2021-11-07 DIAGNOSIS — Y9301 Activity, walking, marching and hiking: Secondary | ICD-10-CM | POA: Insufficient documentation

## 2021-11-07 DIAGNOSIS — Z7951 Long term (current) use of inhaled steroids: Secondary | ICD-10-CM | POA: Insufficient documentation

## 2021-11-07 DIAGNOSIS — W109XXA Fall (on) (from) unspecified stairs and steps, initial encounter: Secondary | ICD-10-CM | POA: Diagnosis not present

## 2021-11-07 DIAGNOSIS — Z7901 Long term (current) use of anticoagulants: Secondary | ICD-10-CM | POA: Diagnosis not present

## 2021-11-07 DIAGNOSIS — Z79899 Other long term (current) drug therapy: Secondary | ICD-10-CM | POA: Insufficient documentation

## 2021-11-07 DIAGNOSIS — I10 Essential (primary) hypertension: Secondary | ICD-10-CM | POA: Diagnosis not present

## 2021-11-07 DIAGNOSIS — I2510 Atherosclerotic heart disease of native coronary artery without angina pectoris: Secondary | ICD-10-CM | POA: Insufficient documentation

## 2021-11-07 DIAGNOSIS — Z7982 Long term (current) use of aspirin: Secondary | ICD-10-CM | POA: Diagnosis not present

## 2021-11-07 DIAGNOSIS — S42152A Displaced fracture of neck of scapula, left shoulder, initial encounter for closed fracture: Secondary | ICD-10-CM

## 2021-11-07 DIAGNOSIS — J90 Pleural effusion, not elsewhere classified: Secondary | ICD-10-CM | POA: Diagnosis not present

## 2021-11-07 DIAGNOSIS — S42102A Fracture of unspecified part of scapula, left shoulder, initial encounter for closed fracture: Secondary | ICD-10-CM | POA: Diagnosis not present

## 2021-11-07 DIAGNOSIS — S4992XA Unspecified injury of left shoulder and upper arm, initial encounter: Secondary | ICD-10-CM | POA: Diagnosis not present

## 2021-11-07 DIAGNOSIS — S0990XA Unspecified injury of head, initial encounter: Secondary | ICD-10-CM

## 2021-11-07 DIAGNOSIS — M4312 Spondylolisthesis, cervical region: Secondary | ICD-10-CM | POA: Diagnosis not present

## 2021-11-07 DIAGNOSIS — M2578 Osteophyte, vertebrae: Secondary | ICD-10-CM | POA: Diagnosis not present

## 2021-11-07 DIAGNOSIS — S0003XA Contusion of scalp, initial encounter: Secondary | ICD-10-CM | POA: Insufficient documentation

## 2021-11-07 DIAGNOSIS — J439 Emphysema, unspecified: Secondary | ICD-10-CM | POA: Diagnosis not present

## 2021-11-07 LAB — COMPREHENSIVE METABOLIC PANEL
ALT: 16 U/L (ref 0–44)
AST: 25 U/L (ref 15–41)
Albumin: 2.9 g/dL — ABNORMAL LOW (ref 3.5–5.0)
Alkaline Phosphatase: 112 U/L (ref 38–126)
Anion gap: 12 (ref 5–15)
BUN: 23 mg/dL (ref 8–23)
CO2: 21 mmol/L — ABNORMAL LOW (ref 22–32)
Calcium: 9 mg/dL (ref 8.9–10.3)
Chloride: 95 mmol/L — ABNORMAL LOW (ref 98–111)
Creatinine, Ser: 1.87 mg/dL — ABNORMAL HIGH (ref 0.61–1.24)
GFR, Estimated: 39 mL/min — ABNORMAL LOW (ref >60)
Glucose, Bld: 137 mg/dL — ABNORMAL HIGH (ref 70–99)
Potassium: 3.8 mmol/L (ref 3.5–5.1)
Sodium: 128 mmol/L — ABNORMAL LOW (ref 135–145)
Total Bilirubin: 0.4 mg/dL (ref 0.3–1.2)
Total Protein: 6.8 g/dL (ref 6.5–8.1)

## 2021-11-07 LAB — CBC
HCT: 28.6 % — ABNORMAL LOW (ref 39.0–52.0)
Hemoglobin: 9.7 g/dL — ABNORMAL LOW (ref 13.0–17.0)
MCH: 34.8 pg — ABNORMAL HIGH (ref 26.0–34.0)
MCHC: 33.9 g/dL (ref 30.0–36.0)
MCV: 102.5 fL — ABNORMAL HIGH (ref 80.0–100.0)
Platelets: 470 10*3/uL — ABNORMAL HIGH (ref 150–400)
RBC: 2.79 MIL/uL — ABNORMAL LOW (ref 4.22–5.81)
RDW: 13 % (ref 11.5–15.5)
WBC: 10 10*3/uL (ref 4.0–10.5)
nRBC: 0 % (ref 0.0–0.2)

## 2021-11-07 LAB — CBG MONITORING, ED: Glucose-Capillary: 136 mg/dL — ABNORMAL HIGH (ref 70–99)

## 2021-11-07 MED ORDER — OXYCODONE HCL 5 MG PO TABS: 5.0000 mg | ORAL_TABLET | Freq: Four times a day (QID) | ORAL | 0 refills | Status: DC | PRN

## 2021-11-07 NOTE — ED Provider Notes (Signed)
Patient signed out to me at 3 PM.  Awaiting x-ray of his left shoulder.  Mechanical fall.  Lab work and CT imaging unremarkable thus far. ? ?Per my review and interpretation of his left shoulder x-ray there appears to be fracture of the glenoid.  Radiology report states minimally displaced pathologic fracture of the glenoid.  Otherwise no pneumothorax or rib injuries.  Talked with Hilbert Odor with orthopedics on the phone who recommends sling and outpatient follow-up.  We will write Roxicodone for breakthrough pain.  Discharged in good condition. ? ?This chart was dictated using voice recognition software.  Despite best efforts to proofread,  errors can occur which can change the documentation meaning.  ?  Lennice Sites, DO ?11/07/21 2315 ? ?

## 2021-11-07 NOTE — ED Triage Notes (Signed)
Pt BIB [facility] d/t a fall. Pt on plavix. Per Pt, he fell down 7 stairs while walking out the house. Pt is a stage 4 lung cancer pt who is currently on chemo therapy. Pt is A&OX4. Hematoma noted on L forehead. Multiple abrasions on extremities.  ?

## 2021-11-07 NOTE — Progress Notes (Signed)
Orthopedic Tech Progress Note ?Patient Details:  ?Sean Rose ?1955/10/25 ?[id number] ? ?Ortho Devices ?Type of Ortho Device: Shoulder immobilizer ?Ortho Device/Splint Location: LUE ?Ortho Device/Splint Interventions: Ordered, Application, Adjustment ?  ?Post Interventions ?Patient Tolerated: Well ?Instructions Provided: Care of device ? ?Janit Pagan ?11/07/2021, 4:53 PM ? ?

## 2021-11-07 NOTE — Progress Notes (Signed)
This patient was briefly evaluated by me at the bridge. He had a fall from 5-6 steps, hit his head, has a small L scalp hematoma and a brief syncope after the fall. EMS had activated this as a code stroke for patient being on plavix. On evaluation at the bridge, patient has no focal deficit. ? ?Discussed with Dr. Alvino Chapel and we made a joint decision to cancel the code stroke. I also educated EMS to to only activate code stroke if patient's have any focal deficit. This patient did not have any focal deficit at any point during the entire episode, he was a bit confused but now back to himself. ? ?Please let us know if you need our assistance. Neurology will signoff. ? ? ?Donnetta Simpers ?[facility] ?Pager Number [phone] ?

## 2021-11-07 NOTE — Code Documentation (Addendum)
Patient arrived by [facility] with complaints of fall down seven stairs while exiting his house. Pt reports not knowing what caused his fall, but did not lose consciousness during the fall or post-fall. Pt has history of metastatic lung cancer on Plavix. Unknown if fall was mechanical or dizziness, but patient is adament he did not lose consciousness.  ? ?Upon arrival to the ED, pt is alert and oriented x4, GCS 15, NIHSS 0 with no deficits noted. Abrasions and Hematoma noted to the left lateral temporal area, Abrasions noted to left elbow, left hand, bilateral knees, and left shin with some mild bleeding. ? ?Code Stroke cancelled upon arrival and Level 2 Activated. Labs drawn at the bridge and patient taken directly to CT1.  ? ?Imaging completed per EDP [place] order. Once completed, pt transported to [place] where he was placed on cardiac monitor and Chloe RN given report.  ? ?Mohammed Kindle, RN  ?Stroke Response  ?

## 2021-11-07 NOTE — ED Provider Notes (Signed)
?[place] ?Provider Note ? ? ?CSN: [id number] ?Arrival date & time: 11/07/21  1412 ? ?An emergency department physician performed an initial assessment on this suspected stroke patient at 9. ? ?History ? ?Chief Complaint  ?Patient presents with  ? Fall  ? ? ?Sean Rose is a 66 y.o. male. ? ? ?Fall ?Pertinent negatives include no shortness of breath. Patient presented called by EMS as a code stroke.  Fell on Plavix and hit his head.  Hematoma to left head.  No reported deficits.  Patient states that he was just unsteady and fell.  However did lose consciousness with the following.  Met at the bridge by neurology and myself.  However with no focal deficits code stroke was canceled and patient was made a level 2 trauma due to the fall on Plavix.  Complaining only of some pain in the left shoulder.  Does have a history of metastatic cancer and states he has some chronic neck pain that is no different ?History of metastatic lung cancer.  History of peripheral vascular disease ?  ?Past Medical History:  ?Diagnosis Date  ? Allergy   ? Anemia   ? Anginal pain ([place])   ? Blood transfusion without reported diagnosis   ? CAD (coronary artery disease)   ? CAP (community acquired pneumonia) 09/2016  ? COPD (chronic obstructive pulmonary disease) ([place])   ? Emphysema of lung ([place])   ? GERD (gastroesophageal reflux disease)   ? Heart murmur   ? "I was told I had one when I was a kid"  ? Hemorrhoids   ? History of anal fissures   ? "no surgeries" (10/30/2016)  ? Hyperlipidemia   ? Hypertension   ? lung ca dx'd 08/2018  ? with mets to bones in arms  ? Myocardial infarction [facility])   ? Peripheral arterial disease ([place])   ? status post right common iliac artery stenting back in 2007  ? Seasonal allergies   ? Tobacco abuse   ? ? ?Home Medications ?Prior to Admission medications   ?Medication Sig Start Date End Date Taking? Authorizing Provider  ?acetaminophen (TYLENOL) 500 MG tablet Take 1,000 mg  by mouth every 6 (six) hours as needed for headache.   Yes [provider]  ?amLODipine (NORVASC) 5 MG tablet Take 1 tablet (5 mg total) by mouth daily. 04/06/21 01/05/22 Yes Warren Lacy, PA-C  ?aspirin EC 81 MG tablet Take 81 mg by mouth daily.   Yes [provider]  ?clopidogrel (PLAVIX) 75 MG tablet TAKE 1 TABLET BY MOUTH EVERY DAY ?Patient taking differently: Take 75 mg by mouth daily. 09/24/21  Yes Lorretta Harp, MD  ?esomeprazole (NEXIUM) 20 MG capsule Take 40 mg by mouth daily.   Yes [provider]  ?fexofenadine (ALLEGRA) 60 MG tablet Take 60 mg by mouth daily.   Yes [provider]  ?Fluticasone-Umeclidin-Vilant (TRELEGY ELLIPTA) 100-62.5-25 MCG/ACT AEPB Inhale 1 puff into the lungs daily. 10/24/21  Yes Icard, Octavio Graves, DO  ?folic acid (FOLVITE) 1 MG tablet TAKE 1 TABLET(1 MG) BY MOUTH DAILY ?Patient taking differently: Take 1 mg by mouth daily. 08/20/21  Yes [name], MD  ?ibuprofen (ADVIL) 200 MG tablet Take 400 mg by mouth every 6 (six) hours as needed for headache or mild pain.   Yes [provider]  ?metoprolol succinate (TOPROL XL) 25 MG 24 hr tablet Take 1 tablet (25 mg total) by mouth daily. 04/06/21  Yes Warren Lacy, PA-C  ?  Multiple Vitamin (MULTIVITAMIN WITH MINERALS) TABS tablet Take 1 tablet by mouth daily.   Yes [provider]  ?nitroGLYCERIN (NITROSTAT) 0.4 MG SL tablet PLACE 1 TABLET UNDER THE TONGUE EVERY 5 MINUTES AS NEEDED FOR CHEST PAIN ?Patient taking differently: Place 0.4 mg under the tongue every 5 (five) minutes as needed. 09/24/21  Yes Lorretta Harp, MD  ?rosuvastatin (CRESTOR) 5 MG tablet TAKE 1 TABLET(5 MG) BY MOUTH EVERY OTHER DAY ?Patient taking differently: Take 5 mg by mouth every other day. 07/02/21  Yes Lorretta Harp, MD  ?albuterol (PROVENTIL) (2.5 MG/3ML) 0.083% nebulizer solution Take 3 mLs (2.5 mg total) by nebulization every 6 (six) hours as needed for wheezing or shortness of  breath. ?Patient not taking: Reported on 10/31/2021 07/17/21   June Leap L, DO  ?lidocaine-prilocaine (EMLA) cream Apply to the Port-A-Cath site 30-60 minutes before treatment. ?Patient not taking: Reported on 11/07/2021 10/30/20   [name], MD  ?Magnesium 500 MG TABS Take 1 tablet (500 mg total) by mouth 3 (three) times daily. ?Patient not taking: Reported on 11/07/2021 03/02/21   Nita Sells, MD  ?ondansetron (ZOFRAN) 8 MG tablet TAKE 1 TABLET(8 MG) BY MOUTH EVERY 8 HOURS AS NEEDED FOR NAUSEA OR VOMITING ?Patient not taking: Reported on 11/07/2021 08/23/21   [name], MD  ?promethazine (PHENERGAN) 12.5 MG tablet Take 1 tablet (12.5 mg total) by mouth every 6 (six) hours as needed for nausea or vomiting. ?Patient not taking: Reported on 10/31/2021 06/01/21   Barrie Folk, PA-C  ?triamcinolone cream (KENALOG) 0.1 % Apply 1 application topically 2 (two) times daily. ?Patient not taking: Reported on 10/31/2021 06/29/21   Eulas Post, MD  ?umeclidinium-vilanterol [employer] ELLIPTA) 62.5-25 MCG/INH AEPB Inhale 1 puff into the lungs daily. ?Patient not taking: Reported on 11/07/2021 11/23/20   Garner Nash, DO  ?   ? ?Allergies    ?Compazine [prochlorperazine]   ? ?Review of Systems   ?Review of Systems  ?Constitutional:  Negative for appetite change.  ?Respiratory:  Negative for shortness of breath.   ?Skin:  Positive for wound.  ? ?Physical Exam ?Updated Vital Signs ?BP 137/86   Pulse 74   Resp 15   Ht '5\' 8"'  (1.727 m)   Wt 48.5 kg   SpO2 99%   BMI 16.25 kg/m?  ?Physical Exam ?Vitals and nursing note reviewed.  ?HENT:  ?   Head:  ?   Comments: Hematoma to left forehead/scalp with abrasion. ?   Nose: Nose normal.  ?Eyes:  ?   Extraocular Movements: Extraocular movements intact.  ?   Pupils: Pupils are equal, round, and reactive to light.  ?Neck:  ?   Comments: Cervical collar in place.  Some upper spinal tenderness.  No step-off.  No deformity ?Cardiovascular:  ?   Rate and Rhythm:  Regular rhythm.  ?Chest:  ?   Chest wall: No tenderness.  ?Abdominal:  ?   Tenderness: There is no abdominal tenderness.  ?Musculoskeletal:     ?   General: Tenderness present.  ?   Comments: Tenderness to the distal left clavicle.  Skin tear on left hand without underlying bony tenderness.  No elbow tenderness.  No extremity tenderness.  No pelvic tenderness.  No thoracic or lumbar spine tenderness.  ?Skin: ?   Capillary Refill: Capillary refill takes less than 2 seconds.  ?Neurological:  ?   Mental Status: He is alert and oriented to person, place, and time.  ? ? ?ED Results / Procedures /  Treatments   ?Labs ?(all labs ordered are listed, but only abnormal results are displayed) ?Labs Reviewed  ?CBC - Abnormal; Notable for the following components:  ?    Result Value  ? RBC 2.79 (*)   ? Hemoglobin 9.7 (*)   ? HCT 28.6 (*)   ? MCV 102.5 (*)   ? MCH 34.8 (*)   ? Platelets 470 (*)   ? All other components within normal limits  ?CBG MONITORING, ED - Abnormal; Notable for the following components:  ? Glucose-Capillary 136 (*)   ? All other components within normal limits  ?COMPREHENSIVE METABOLIC PANEL  ? ? ?EKG ?None ? ?Radiology ?CT HEAD WO CONTRAST (5MM) ? ?Result Date: 11/07/2021 ?CLINICAL DATA:  Head trauma, moderate-severe EXAM: CT HEAD WITHOUT CONTRAST TECHNIQUE: Contiguous axial images were obtained from the base of the skull through the vertex without intravenous contrast. RADIATION DOSE REDUCTION: This exam was performed according to the departmental dose-optimization program which includes automated exposure control, adjustment of the mA and/or kV according to patient size and/or use of iterative reconstruction technique. COMPARISON:  None. FINDINGS: Brain: There is no acute intracranial hemorrhage, mass effect, or edema. Gray-white differentiation is preserved. There is no extra-axial fluid collection. Prominence of the ventricles and sulci reflects minor parenchymal volume loss. Patchy hypoattenuation in  the supratentorial white matter is nonspecific but probably reflects mild to moderate chronic microvascular ischemic changes. Vascular: There is atherosclerotic calcification at the skull base. Skull: Calvarium is unremar

## 2021-11-07 NOTE — ED Notes (Signed)
Trauma Response Nurse Documentation ? ? ?Sean Rose is a 66 y.o. male arriving to [facility] ED via EMS ? ?On clopidogrel 75 mg daily. Trauma was activated as a Level 2 by ED Charge RN based on the following trauma criteria Elderly patients > 65 with head trauma on anti-coagulation (excluding ASA). Trauma team at the bedside on patient arrival. Patient cleared for CT by Dr. Alvino Chapel. Patient to CT with team. GCS 15.  Pt was initially brought in as a code stroke which was then cancelled at the bridge due to pt not having deficits.  Stroke RN, Jarrett Soho took pt to CT while this trauma nurse was in with another trauma. ? ?History  ? Past Medical History:  ?Diagnosis Date  ?? Allergy   ?? Anemia   ?? Anginal pain ([place])   ?? Blood transfusion without reported diagnosis   ?? CAD (coronary artery disease)   ?? CAP (community acquired pneumonia) 09/2016  ?? COPD (chronic obstructive pulmonary disease) ([place])   ?? Emphysema of lung ([place])   ?? GERD (gastroesophageal reflux disease)   ?? Heart murmur   ? "I was told I had one when I was a kid"  ?? Hemorrhoids   ?? History of anal fissures   ? "no surgeries" (10/30/2016)  ?? Hyperlipidemia   ?? Hypertension   ?? lung ca dx'd 08/2018  ? with mets to bones in arms  ?? Myocardial infarction [facility])   ?? Peripheral arterial disease ([place])   ? status post right common iliac artery stenting back in 2007  ?? Seasonal allergies   ?? Tobacco abuse   ?  ? Past Surgical History:  ?Procedure Laterality Date  ?? ANKLE SURGERY Left   ? "rebuilt it"  ?? ANTERIOR CRUCIATE LIGAMENT REPAIR Right   ?? CARDIAC CATHETERIZATION  09/23/2008  ? Continued medical therapy - may need GI evaluation in addition.  ?? CARDIAC CATHETERIZATION  10/28/2007  ? Medical therapy recommended.  ?? CARDIAC CATHETERIZATION  11/18/2006  ? In-stent restenosis RCA  (50% distal edge, 80% segmental mid, and 50-60% segmental proximal). Successful cutting balloon atherectomy using a [id number] cutting balloon. 3 inflations with  atherectomy performed on mid and proximal portions resulting in reduction of 80% mid in-stent restenosis to less than 20% residual and 50-60% segmental proximal to less than 20% residual without dissection.  ?? CARDIAC CATHETERIZATION  02/26/2006  ? Severe stenosis in RCA. Stenting performed using IVUS. 3.5x20 Maverick balloon deployed at [employer]. Distal stent-a 4x28 Liberte stent-deployed 12atm 48sec, 12atm 31sec, 4atm 19sec. Mid stent-a 4x28 Liberte stent-deployed 14atm 45sec, 14atm 60sec, 14atm 44sec. Proximal stent-4x8 Liberte- 14atm 45sec,14atm 47sec, 16atm 43sec. Severely diseased segment then appeared TIMI-3 flow.  ?? CARDIOVASCULAR STRESS TEST  11/17/2012  ? No significant ECG changes. Septal perfusion defect is new when complared to study from 2010. Abnormal myocardial perfusion imaging with a basal to mid perfusion suggestive of previous MI.  ?? CAROTID DOPPLER  08/09/2011  ? Bilateral Bulb/Proximal ICA - demonstrated a mild amount of fibrous plaque without evidence of significant diameter reduction reduction or other vascular abnormality.  ?? CHEST TUBE INSERTION Right 09/02/2018  ? Procedure: Chest Tube Insertion;  Surgeon: Garner Nash, DO;  Location: [place];  Service: Thoracic;  Laterality: Right;  ?? COLONOSCOPY    ? 2003, 2014  ?? CORONARY ANGIOPLASTY WITH STENT PLACEMENT    ?? FEMORAL ARTERY STENT    ?? INGUINAL HERNIA REPAIR Right   ?? IR IMAGING GUIDED PORT INSERTION  11/13/2020  ??  KNEE ARTHROSCOPY Right "multiple"  ?? LAPAROSCOPIC APPENDECTOMY N/A 12/07/2018  ? Procedure: APPENDECTOMY LAPAROSCOPIC;  Surgeon: Coralie Keens, MD;  Location: [facility];  Service: General;  Laterality: N/A;  ?? LOWER EXTREMITY ARTERIAL DOPPLER  01/31/2011  ? Bilateral ABIs-normal values with no suggestion of arterial insuff to the lower extremities at rest. Right CIA stent-mild amount of nonhemodynamically significant plaque is noted throughout  ?? MANDIBLE SURGERY  1990s  ? "bone-eating tumor"  ?? PERCUTANEOUS STENT  INTERVENTION  04/04/2006 & 04/13/2015  ? a. Right common iliac artery with an 8.0x18 mm Herculink stent deployed at 12 atm. Stenosis was reduced from 80% to 0% with brisk flow. b. I-cast stenting to left common iliac artery  ?? PERIPHERAL VASCULAR CATHETERIZATION N/A 04/13/2015  ? Procedure: Lower Extremity Angiography;  Surgeon: Lorretta Harp, MD; L-oCIA 75%, 40-50% L-EIA, R-CIA stent patent, s/p 8 mm x 38 mm ICast covered stent>>0% stenosis in L-oCIA     ?? SHOULDER ARTHROSCOPY WITH ROTATOR CUFF REPAIR Right   ?? TRANSTHORACIC ECHOCARDIOGRAM  11/26/2012  ? EF not noted. Aortic valve-sclerosis without stenosis, no regurgiation.   ?? UPPER GASTROINTESTINAL ENDOSCOPY    ?? US CAROTID DOPPLER BILATERAL ([facility])  08/09/2011  ? Bilateral Bulb/Proximal ICAa demonstrated a mild amount of fibrous plaque without evidence of significant diameter reduction or any other vascular abnormality.  ?? VIDEO ASSISTED THORACOSCOPY (VATS)/ LOBECTOMY Right 09/14/2018  ? Procedure: RIGHT VIDEO ASSISTED THORACOSCOPY (VATS)/ RIGHT UPPER LOBECTOMY;  Surgeon: Melrose Nakayama, MD;  Location: [facility];  Service: Thoracic;  Laterality: Right;  ?? VIDEO BRONCHOSCOPY WITH ENDOBRONCHIAL NAVIGATION N/A 09/02/2018  ? Procedure: VIDEO BRONCHOSCOPY WITH ENDOBRONCHIAL NAVIGATION;  Surgeon: Garner Nash, DO;  Location: [place];  Service: Thoracic;  Laterality: N/A;  ?  ? ? ? ?Initial Focused Assessment (If applicable, or please see trauma documentation): ?- GCS 15 ?- no neurological deficits ?- PIV to L arm established by EMS ? ?CT's Completed:   ?CT Head CT c-spine  ? ?Interventions:  ?- Trauma labs ?- Chest and L shoulder XR ?- CT head and neck ?- apply shoulder immobilizer/sling ? ?Plan for disposition:  ?Discharge home  ? ?Consults completed:  ?none at 1500. ? ? ?Bedside handoff with ED RN Chloe.   ? ?Dulcy Fanny W  ?Trauma Response RN ? ?Please call TRN at [phone] for further assistance. ?  ?

## 2021-11-07 NOTE — Progress Notes (Signed)
Orthopedic Tech Progress Note ?Patient Details:  ?Sean Rose ?October 17, 1955 ?[id number] ? ?Level 2 trauma  ? ?Patient ID: Sean Rose, male   DOB: Dec 13, 1955, 66 y.o.   MRN: [id number] ? ?Janit Pagan ?11/07/2021, 3:23 PM ? ?

## 2021-11-07 NOTE — Discharge Instructions (Addendum)
You have a fracture of your glenoid bone in your left shoulder.  For now we will treat this with immobilization with a sling.  He will follow-up with orthopedic doctor whose number has been provided.  Do not bear any weight with your left upper extremity until you are seen by orthopedics.  It is okay to remove the sling for showers.  I have prescribed you narcotic pain medicine for any breakthrough pain but recommend 1000 mg of Tylenol every 6 hours as needed for pain.  Do not take any alcohol or other sedating drugs while taking Roxicodone narcotic pain medicine.  Do not drive or do any other dangerous activities. ?

## 2021-11-07 NOTE — Progress Notes (Signed)
?   11/07/21 1417  ?Clinical Encounter Type  ?Visited With Patient not available;Health care provider ?(Patient Gone to CT)  ?Visit Type ED;Trauma;Code;Initial ?(Changed to Code Stroke)  ?Referral From Nurse ?(Bridge)  ?Consult/Referral To Chaplain ?Albertina Parr [place])  ? ?Responded to page in E.D. [facility] Room 28 for Level 2 Trauma. Upon arrival to E.D. Mr. Abdulaziz Toman status was changed to Code Stroke and had been taken to CT therfore patient not seen by Chaplain at this time. No family present at this time. Staff will page Chaplain upon request of patient or family. Chaplain Jeanann Balinski, M.Min., [phone].   ?

## 2021-11-09 ENCOUNTER — Telehealth: Payer: Self-pay

## 2021-11-09 DIAGNOSIS — M25512 Pain in left shoulder: Secondary | ICD-10-CM | POA: Diagnosis not present

## 2021-11-09 NOTE — Telephone Encounter (Signed)
Pt LM stating he fell on 11/07/21 and fx his glenoid bone "where the cancer metastasized" and will be seeing a sports medicine provider next week. Pt states he does not need a return call and just wanted dr. [name] to be aware of what has happened.  ?

## 2021-11-20 DIAGNOSIS — M25512 Pain in left shoulder: Secondary | ICD-10-CM | POA: Diagnosis not present

## 2021-12-04 DIAGNOSIS — S42145D Nondisplaced fracture of glenoid cavity of scapula, left shoulder, subsequent encounter for fracture with routine healing: Secondary | ICD-10-CM | POA: Diagnosis not present

## 2021-12-04 DIAGNOSIS — M6281 Muscle weakness (generalized): Secondary | ICD-10-CM | POA: Diagnosis not present

## 2021-12-05 ENCOUNTER — Inpatient Hospital Stay: Payer: BC Managed Care – PPO | Attending: Internal Medicine

## 2021-12-05 ENCOUNTER — Inpatient Hospital Stay: Payer: BC Managed Care – PPO | Admitting: Dietician

## 2021-12-05 ENCOUNTER — Other Ambulatory Visit: Payer: Self-pay

## 2021-12-05 DIAGNOSIS — Z95828 Presence of other vascular implants and grafts: Secondary | ICD-10-CM

## 2021-12-05 DIAGNOSIS — C3411 Malignant neoplasm of upper lobe, right bronchus or lung: Secondary | ICD-10-CM | POA: Insufficient documentation

## 2021-12-05 DIAGNOSIS — C7951 Secondary malignant neoplasm of bone: Secondary | ICD-10-CM | POA: Diagnosis not present

## 2021-12-05 DIAGNOSIS — Z452 Encounter for adjustment and management of vascular access device: Secondary | ICD-10-CM | POA: Diagnosis not present

## 2021-12-05 DIAGNOSIS — C770 Secondary and unspecified malignant neoplasm of lymph nodes of head, face and neck: Secondary | ICD-10-CM | POA: Insufficient documentation

## 2021-12-05 MED ORDER — HEPARIN SOD (PORK) LOCK FLUSH 100 UNIT/ML IV SOLN
500.0000 [IU] | Freq: Once | INTRAVENOUS | Status: AC
  Administered 2021-12-05: 500 [IU]

## 2021-12-05 MED ORDER — SODIUM CHLORIDE 0.9% FLUSH
10.0000 mL | Freq: Once | INTRAVENOUS | Status: AC
  Administered 2021-12-05: 10 mL

## 2021-12-05 NOTE — Progress Notes (Signed)
Nutrition Follow-up: ? ?Patient with recurrent NSCLC. He is currently on break from treatment.  ? ?Met with patient and wife prior to port flush. He reports recent fall at home resulting in shoulder fracture. Patient has started physical therapy and this is healing well per pt. Patient reports decreased appetite and feeling down for ~one month following injury. He endorses appetite is getting back on track. He is eating 2 meals and a snack. Per recall, he typically eats 2 packets of oatmeal and half banana for breakfast. Lunch is bowl of soup (chunky beef, tomato, chx noodle) and grilled cheese sandwich. He does not eat dinner, but will have a snack late afternoon (cup of coffee and piece of cake) Patient bought CIB but has not tried any yet. He is concerned it may taste like Ensure. Patient reports some foods he use to enjoy tasted terrible while undergoing chemotherapy. He has not tried revisiting any of these foods yet.  ? ? ?Medications: reviewed  ? ?Labs: no new labs for review  ? ?Anthropometrics: Patient weighed 106.4 lb in office today ? ?4/5 - 106 lb 14.4 oz  ? ? ?NUTRITION DIAGNOSIS: Severe malnutrition ongoing  ? ? ?INTERVENTION:  ?Reviewed strategies for increasing calories and protein with small frequent meals and snacks ?Discussed foods with protein to include with all meals and snacks ?Encouraged pt to try foods he previously enjoyed as altered taste likely improved/resolved given chemotherapy break ~6 months ?High calorie recipes and soft moist high protein ideas provided ?Encouraged pt to try CIB, recommend drinking 2/day ?  ? ?MONITORING, EVALUATION, GOAL: weight trends, intake ? ? ?NEXT VISIT: Wednesday July 5 for weight check  ? ? ? ?

## 2021-12-10 ENCOUNTER — Other Ambulatory Visit: Payer: Self-pay | Admitting: Medical Oncology

## 2021-12-10 ENCOUNTER — Telehealth: Payer: Self-pay | Admitting: Medical Oncology

## 2021-12-10 DIAGNOSIS — M6281 Muscle weakness (generalized): Secondary | ICD-10-CM

## 2021-12-10 DIAGNOSIS — C3491 Malignant neoplasm of unspecified part of right bronchus or lung: Secondary | ICD-10-CM

## 2021-12-10 NOTE — Telephone Encounter (Signed)
Requesting PT to assist with muscle toning due to muscle atrophy. ?

## 2021-12-11 NOTE — Progress Notes (Signed)
PT order was faxed to  [facility] via [facility] . ?

## 2021-12-17 ENCOUNTER — Telehealth: Payer: Self-pay | Admitting: Medical Oncology

## 2021-12-17 NOTE — Telephone Encounter (Signed)
LVM to return my call about PT appt @ [facility]

## 2021-12-20 DIAGNOSIS — S42145D Nondisplaced fracture of glenoid cavity of scapula, left shoulder, subsequent encounter for fracture with routine healing: Secondary | ICD-10-CM | POA: Diagnosis not present

## 2022-01-21 ENCOUNTER — Other Ambulatory Visit: Payer: Self-pay | Admitting: Internal Medicine

## 2022-01-24 ENCOUNTER — Other Ambulatory Visit: Payer: Self-pay | Admitting: Medical Oncology

## 2022-01-24 DIAGNOSIS — C3491 Malignant neoplasm of unspecified part of right bronchus or lung: Secondary | ICD-10-CM

## 2022-01-25 ENCOUNTER — Inpatient Hospital Stay: Payer: BC Managed Care – PPO | Attending: Internal Medicine

## 2022-01-25 ENCOUNTER — Ambulatory Visit (HOSPITAL_COMMUNITY): Payer: BC Managed Care – PPO

## 2022-01-25 ENCOUNTER — Inpatient Hospital Stay: Payer: BC Managed Care – PPO

## 2022-01-25 ENCOUNTER — Ambulatory Visit (HOSPITAL_COMMUNITY)
Admission: RE | Admit: 2022-01-25 | Discharge: 2022-01-25 | Disposition: A | Discharge status: Home / Self Care | Payer: BC Managed Care – PPO | Source: Ambulatory Visit | Attending: Internal Medicine | Admitting: Internal Medicine

## 2022-01-25 ENCOUNTER — Other Ambulatory Visit: Payer: Self-pay

## 2022-01-25 DIAGNOSIS — K3189 Other diseases of stomach and duodenum: Secondary | ICD-10-CM | POA: Diagnosis not present

## 2022-01-25 DIAGNOSIS — C3411 Malignant neoplasm of upper lobe, right bronchus or lung: Secondary | ICD-10-CM | POA: Diagnosis not present

## 2022-01-25 DIAGNOSIS — N2889 Other specified disorders of kidney and ureter: Secondary | ICD-10-CM | POA: Diagnosis not present

## 2022-01-25 DIAGNOSIS — C3490 Malignant neoplasm of unspecified part of unspecified bronchus or lung: Secondary | ICD-10-CM

## 2022-01-25 DIAGNOSIS — J90 Pleural effusion, not elsewhere classified: Secondary | ICD-10-CM | POA: Diagnosis not present

## 2022-01-25 DIAGNOSIS — Z95828 Presence of other vascular implants and grafts: Secondary | ICD-10-CM

## 2022-01-25 DIAGNOSIS — J9811 Atelectasis: Secondary | ICD-10-CM | POA: Diagnosis not present

## 2022-01-25 LAB — CBC WITH DIFFERENTIAL (CANCER CENTER ONLY)
Abs Immature Granulocytes: 0.02 10*3/uL (ref 0.00–0.07)
Basophils Absolute: 0.1 10*3/uL (ref 0.0–0.1)
Basophils Relative: 1 %
Eosinophils Absolute: 0.4 10*3/uL (ref 0.0–0.5)
Eosinophils Relative: 4 %
HCT: 26 % — ABNORMAL LOW (ref 39.0–52.0)
Hemoglobin: 9.2 g/dL — ABNORMAL LOW (ref 13.0–17.0)
Immature Granulocytes: 0 %
Lymphocytes Relative: 32 %
Lymphs Abs: 3.2 10*3/uL (ref 0.7–4.0)
MCH: 33.1 pg (ref 26.0–34.0)
MCHC: 35.4 g/dL (ref 30.0–36.0)
MCV: 93.5 fL (ref 80.0–100.0)
Monocytes Absolute: 0.9 10*3/uL (ref 0.1–1.0)
Monocytes Relative: 8 %
Neutro Abs: 5.6 10*3/uL (ref 1.7–7.7)
Neutrophils Relative %: 55 %
Platelet Count: 523 10*3/uL — ABNORMAL HIGH (ref 150–400)
RBC: 2.78 MIL/uL — ABNORMAL LOW (ref 4.22–5.81)
RDW: 13.3 % (ref 11.5–15.5)
WBC Count: 10.1 10*3/uL (ref 4.0–10.5)
nRBC: 0 % (ref 0.0–0.2)

## 2022-01-25 LAB — CMP (CANCER CENTER ONLY)
ALT: 10 U/L (ref 0–44)
AST: 19 U/L (ref 15–41)
Albumin: 3.5 g/dL (ref 3.5–5.0)
Alkaline Phosphatase: 117 U/L (ref 38–126)
Anion gap: 7 (ref 5–15)
BUN: 14 mg/dL (ref 8–23)
CO2: 27 mmol/L (ref 22–32)
Calcium: 9 mg/dL (ref 8.9–10.3)
Chloride: 91 mmol/L — ABNORMAL LOW (ref 98–111)
Creatinine: 1.62 mg/dL — ABNORMAL HIGH (ref 0.61–1.24)
GFR, Estimated: 47 mL/min — ABNORMAL LOW (ref >60)
Glucose, Bld: 87 mg/dL (ref 70–99)
Potassium: 3.7 mmol/L (ref 3.5–5.1)
Sodium: 125 mmol/L — ABNORMAL LOW (ref 135–145)
Total Bilirubin: 0.3 mg/dL (ref 0.3–1.2)
Total Protein: 7.2 g/dL (ref 6.5–8.1)

## 2022-01-25 MED ORDER — HEPARIN SOD (PORK) LOCK FLUSH 100 UNIT/ML IV SOLN
INTRAVENOUS | Status: AC
  Filled 2022-01-25: qty 5

## 2022-01-25 MED ORDER — SODIUM CHLORIDE 0.9% FLUSH
10.0000 mL | Freq: Once | INTRAVENOUS | Status: AC
  Administered 2022-01-25: 10 mL

## 2022-01-25 MED ORDER — HEPARIN SOD (PORK) LOCK FLUSH 100 UNIT/ML IV SOLN
500.0000 [IU] | Freq: Once | INTRAVENOUS | Status: AC
  Administered 2022-01-25: 500 [IU] via INTRAVENOUS

## 2022-01-28 ENCOUNTER — Other Ambulatory Visit: Payer: BC Managed Care – PPO

## 2022-01-30 ENCOUNTER — Inpatient Hospital Stay: Payer: BC Managed Care – PPO | Admitting: Dietician

## 2022-01-30 ENCOUNTER — Other Ambulatory Visit: Payer: Self-pay

## 2022-01-30 ENCOUNTER — Inpatient Hospital Stay: Payer: BC Managed Care – PPO | Attending: Internal Medicine | Admitting: Internal Medicine

## 2022-01-30 VITALS — BP 133/80 | HR 76 | Temp 97.7°F | Resp 17 | Wt 106.7 lb

## 2022-01-30 DIAGNOSIS — C3411 Malignant neoplasm of upper lobe, right bronchus or lung: Secondary | ICD-10-CM | POA: Insufficient documentation

## 2022-01-30 DIAGNOSIS — C7951 Secondary malignant neoplasm of bone: Secondary | ICD-10-CM | POA: Diagnosis not present

## 2022-01-30 DIAGNOSIS — C770 Secondary and unspecified malignant neoplasm of lymph nodes of head, face and neck: Secondary | ICD-10-CM | POA: Diagnosis not present

## 2022-01-30 DIAGNOSIS — D649 Anemia, unspecified: Secondary | ICD-10-CM | POA: Diagnosis not present

## 2022-01-30 DIAGNOSIS — C3490 Malignant neoplasm of unspecified part of unspecified bronchus or lung: Secondary | ICD-10-CM | POA: Diagnosis not present

## 2022-01-30 DIAGNOSIS — Z902 Acquired absence of lung [part of]: Secondary | ICD-10-CM | POA: Insufficient documentation

## 2022-01-30 NOTE — Progress Notes (Signed)
[place] [phone]   [phone]  OFFICE PROGRESS NOTE  [name], NP [place] [address]  DIAGNOSIS:  Metastatic non-small cell lung cancer initially diagnosed as stage IIIA (T3, N1, M0) non-small cell lung cancer, adenocarcinoma with no actionable mutations presented with multiple pulmonary nodules in the right upper lobe as well as metastatic disease in intraparenchymal lymphadenopathy.  This was diagnosed in February 2020.  The patient has disease recurrence in March 2022. PD-L1 expression 20%. He has no actionable mutations by foundation 1.   PRIOR THERAPY: 1) Status post right upper lobectomy with lymph node dissection. 2) Adjuvant systemic chemotherapy with cisplatin 75 mg/M2 and Alimta 500 mg/M2 every 3 weeks.  Status post 4 cycles. 3) palliative radiotherapy to the metastatic bone disease and the scapula as well as the left supraclavicular area under the care of Dr. Isidore Moos. 3) Systemic chemotherapy with carboplatin for AUC of 5, Alimta 500 mg/M2 and Keytruda 200 mg IV every 3 weeks.  First dose November 06, 2020.  Status post 12 cycles.  Starting from cycle #5, the patient will be treated with maintenance Keytruda and Alimta IV every 3 weeks. Dose reduced to Alimta 400 mg/m2 starting from cycle #11 because of worsening anemia.  Keytruda discontinued from cycle #11 due to possible checkpoint inhibitor toxicity of the stomach.   CURRENT THERAPY: Observation.   INTERVAL HISTORY: Sean Rose 66 y.o. male returns to the clinic today for follow-up visit accompanied by his wife.  The patient is feeling fine today with no concerning complaints except for the baseline fatigue.  He denied having any current chest pain but has shortness of breath with exertion with no cough or hemoptysis.  He denied having any fever or chills.  He has no nausea, vomiting, diarrhea or constipation.  He has no headache or visual changes.  He  has been in observation.  The patient had repeat CT scan of the chest, abdomen pelvis performed recently and is here for evaluation and discussion of his scan results.   MEDICAL HISTORY: Past Medical History:  Diagnosis Date   Allergy    Anemia    Anginal pain ([place])    Blood transfusion without reported diagnosis    CAD (coronary artery disease)    CAP (community acquired pneumonia) 09/2016   COPD (chronic obstructive pulmonary disease) (HCC)    Emphysema of lung (HCC)    GERD (gastroesophageal reflux disease)    Heart murmur    "I was told I had one when I was a kid"   Hemorrhoids    History of anal fissures    "no surgeries" (10/30/2016)   Hyperlipidemia    Hypertension    lung ca dx'd 08/2018   with mets to bones in [place]   Myocardial infarction [facility])    Peripheral arterial disease ([place])    status post right common iliac artery stenting back in 2007   Seasonal allergies    Tobacco abuse     ALLERGIES:  is allergic to compazine [prochlorperazine].  MEDICATIONS:  Current Outpatient Medications  Medication Sig Dispense Refill   acetaminophen (TYLENOL) 500 MG tablet Take 1,000 mg by mouth every 6 (six) hours as needed for headache.     albuterol (PROVENTIL) (2.5 MG/3ML) 0.083% nebulizer solution Take 3 mLs (2.5 mg total) by nebulization every 6 (six) hours as needed for wheezing or shortness of breath. (Patient not taking: Reported on 10/31/2021) 75 mL 12   amLODipine (  NORVASC) 5 MG tablet Take 1 tablet (5 mg total) by mouth daily. 180 tablet 3   aspirin EC 81 MG tablet Take 81 mg by mouth daily.     clopidogrel (PLAVIX) 75 MG tablet TAKE 1 TABLET BY MOUTH EVERY DAY (Patient taking differently: Take 75 mg by mouth daily.) 90 tablet 3   esomeprazole (NEXIUM) 20 MG capsule Take 40 mg by mouth daily.     fexofenadine (ALLEGRA) 60 MG tablet Take 60 mg by mouth daily.     Fluticasone-Umeclidin-Vilant (TRELEGY ELLIPTA) 100-62.5-25 MCG/ACT AEPB Inhale 1 puff into the lungs daily. 60 each  3   folic acid (FOLVITE) 1 MG tablet TAKE 1 TABLET(1 MG) BY MOUTH DAILY 30 tablet 4   ibuprofen (ADVIL) 200 MG tablet Take 400 mg by mouth every 6 (six) hours as needed for headache or mild pain.     lidocaine-prilocaine (EMLA) cream Apply to the Port-A-Cath site 30-60 minutes before treatment. (Patient not taking: Reported on 11/07/2021) 30 g 0   Magnesium 500 MG TABS Take 1 tablet (500 mg total) by mouth 3 (three) times daily. (Patient not taking: Reported on 11/07/2021) 8 tablet 0   metoprolol succinate (TOPROL XL) 25 MG 24 hr tablet Take 1 tablet (25 mg total) by mouth daily. 90 tablet 3   Multiple Vitamin (MULTIVITAMIN WITH MINERALS) TABS tablet Take 1 tablet by mouth daily.     nitroGLYCERIN (NITROSTAT) 0.4 MG SL tablet PLACE 1 TABLET UNDER THE TONGUE EVERY 5 MINUTES AS NEEDED FOR CHEST PAIN (Patient taking differently: Place 0.4 mg under the tongue every 5 (five) minutes as needed.) 25 tablet 6   ondansetron (ZOFRAN) 8 MG tablet TAKE 1 TABLET(8 MG) BY MOUTH EVERY 8 HOURS AS NEEDED FOR NAUSEA OR VOMITING (Patient not taking: Reported on 11/07/2021) 20 tablet 0   oxyCODONE (ROXICODONE) 5 MG immediate release tablet Take 1 tablet (5 mg total) by mouth every 6 (six) hours as needed for up to 20 doses for breakthrough pain. 20 tablet 0   promethazine (PHENERGAN) 12.5 MG tablet Take 1 tablet (12.5 mg total) by mouth every 6 (six) hours as needed for nausea or vomiting. (Patient not taking: Reported on 10/31/2021) 30 tablet 0   rosuvastatin (CRESTOR) 5 MG tablet TAKE 1 TABLET(5 MG) BY MOUTH EVERY OTHER DAY (Patient taking differently: Take 5 mg by mouth every other day.) 45 tablet 3   triamcinolone cream (KENALOG) 0.1 % Apply 1 application topically 2 (two) times daily. (Patient not taking: Reported on 10/31/2021) 30 g 1   umeclidinium-vilanterol (ANORO ELLIPTA) 62.5-25 MCG/INH AEPB Inhale 1 puff into the lungs daily. (Patient not taking: Reported on 11/07/2021) 60 each 5   No current facility-administered  medications for this visit.   Facility-Administered Medications Ordered in Other Visits  Medication Dose Route Frequency Provider Last Rate Last Admin   diphenhydrAMINE (BENADRYL) 25 mg capsule             SURGICAL HISTORY:  Past Surgical History:  Procedure Laterality Date   ANKLE SURGERY Left    "rebuilt it"   ANTERIOR CRUCIATE LIGAMENT REPAIR Right    CARDIAC CATHETERIZATION  09/23/2008   Continued medical therapy - may need GI evaluation in addition.   CARDIAC CATHETERIZATION  10/28/2007   Medical therapy recommended.   CARDIAC CATHETERIZATION  11/18/2006   In-stent restenosis RCA  (50% distal edge, 80% segmental mid, and 50-60% segmental proximal). Successful cutting balloon atherectomy using a [id number] cutting balloon. 3 inflations with atherectomy performed on mid and proximal  portions resulting in reduction of 80% mid in-stent restenosis to less than 20% residual and 50-60% segmental proximal to less than 20% residual without dissection.   CARDIAC CATHETERIZATION  02/26/2006   Severe stenosis in RCA. Stenting performed using IVUS. 3.5x20 Maverick balloon deployed at [employer]. Distal stent-a 4x28 Liberte stent-deployed 12atm 48sec, 12atm 31sec, 4atm 19sec. Mid stent-a 4x28 Liberte stent-deployed 14atm 45sec, 14atm 60sec, 14atm 44sec. Proximal stent-4x8 Liberte- 14atm 45sec,14atm 47sec, 16atm 43sec. Severely diseased segment then appeared TIMI-3 flow.   CARDIOVASCULAR STRESS TEST  11/17/2012   No significant ECG changes. Septal perfusion defect is new when complared to study from 2010. Abnormal myocardial perfusion imaging with a basal to mid perfusion suggestive of previous MI.   CAROTID DOPPLER  08/09/2011   Bilateral Bulb/Proximal ICA - demonstrated a mild amount of fibrous plaque without evidence of significant diameter reduction reduction or other vascular abnormality.   CHEST TUBE INSERTION Right 09/02/2018   Procedure: Chest Tube Insertion;  Surgeon: Garner Nash, DO;  Location: [facility];  Service: Thoracic;  Laterality: Right;   COLONOSCOPY     2003, 2014   CORONARY ANGIOPLASTY WITH STENT PLACEMENT     FEMORAL ARTERY STENT     INGUINAL HERNIA REPAIR Right    IR IMAGING GUIDED PORT INSERTION  11/13/2020   KNEE ARTHROSCOPY Right "multiple"   LAPAROSCOPIC APPENDECTOMY N/A 12/07/2018   Procedure: APPENDECTOMY LAPAROSCOPIC;  Surgeon: Coralie Keens, MD;  Location: [facility];  Service: General;  Laterality: N/A;   LOWER EXTREMITY ARTERIAL DOPPLER  01/31/2011   Bilateral ABIs-normal values with no suggestion of arterial insuff to the lower extremities at rest. Right CIA stent-mild amount of nonhemodynamically significant plaque is noted throughout   [place]   "bone-eating tumor"   PERCUTANEOUS STENT INTERVENTION  04/04/2006 & 04/13/2015   a. Right common iliac artery with an 8.0x18 mm Herculink stent deployed at 12 atm. Stenosis was reduced from 80% to 0% with brisk flow. b. I-cast stenting to left common iliac artery   PERIPHERAL VASCULAR CATHETERIZATION N/A 04/13/2015   Procedure: Lower Extremity Angiography;  Surgeon: Lorretta Harp, MD; L-oCIA 75%, 40-50% L-EIA, R-CIA stent patent, s/p 8 mm x 38 mm ICast covered stent>>0% stenosis in [place] Right    TRANSTHORACIC ECHOCARDIOGRAM  11/26/2012   EF not noted. Aortic valve-sclerosis without stenosis, no regurgiation.    UPPER GASTROINTESTINAL ENDOSCOPY     US CAROTID DOPPLER BILATERAL ([facility])  08/09/2011   Bilateral Bulb/Proximal ICAa demonstrated a mild amount of fibrous plaque without evidence of significant diameter reduction or any other vascular abnormality.   VIDEO ASSISTED THORACOSCOPY (VATS)/ LOBECTOMY Right 09/14/2018   Procedure: RIGHT VIDEO ASSISTED THORACOSCOPY (VATS)/ RIGHT UPPER LOBECTOMY;  Surgeon: Melrose Nakayama, MD;  Location: [facility];  Service: Thoracic;  Laterality: Right;   VIDEO BRONCHOSCOPY WITH ENDOBRONCHIAL NAVIGATION N/A 09/02/2018    Procedure: VIDEO BRONCHOSCOPY WITH ENDOBRONCHIAL NAVIGATION;  Surgeon: Garner Nash, DO;  Location: [facility];  Service: Thoracic;  Laterality: N/A;    REVIEW OF SYSTEMS:  Constitutional: positive for fatigue Eyes: negative Ears, nose, mouth, throat, and face: negative Respiratory: positive for dyspnea on exertion Cardiovascular: negative Gastrointestinal: negative Genitourinary:negative Integument/breast: negative Hematologic/lymphatic: negative Musculoskeletal:positive for arthralgias Neurological: negative Behavioral/Psych: negative Endocrine: negative Allergic/Immunologic: negative   PHYSICAL EXAMINATION: General appearance: alert, cooperative, fatigued, and no distress Head: Normocephalic, without obvious abnormality, atraumatic Neck: no adenopathy, no JVD, supple, symmetrical, trachea midline, and thyroid  not enlarged, symmetric, no tenderness/mass/nodules Lymph nodes: Cervical, supraclavicular, and axillary nodes normal. Resp: clear to auscultation bilaterally Back: symmetric, no curvature. ROM normal. No CVA tenderness. Cardio: regular rate and rhythm, S1, S2 normal, no murmur, click, rub or gallop GI: soft, non-tender; bowel sounds normal; no masses,  no organomegaly Extremities: extremities normal, atraumatic, no cyanosis or edema Neurologic: Alert and oriented X 3, normal strength and tone. Normal symmetric reflexes. Normal coordination and gait  ECOG PERFORMANCE STATUS: 1 - Symptomatic but completely ambulatory  Blood pressure 133/80, pulse 76, temperature 97.7 F (36.5 C), temperature source Oral, resp. rate 17, weight 106 lb 11.2 oz (48.4 kg), SpO2 98 %.  LABORATORY DATA: Lab Results  Component Value Date   WBC 10.1 01/25/2022   HGB 9.2 (L) 01/25/2022   HCT 26.0 (L) 01/25/2022   MCV 93.5 01/25/2022   PLT 523 (H) 01/25/2022      Chemistry      Component Value Date/Time   NA 125 (L) 01/25/2022 1006   NA 130 (L) 03/05/2017 1110   K 3.7 01/25/2022 1006   CL  91 (L) 01/25/2022 1006   CO2 27 01/25/2022 1006   BUN 14 01/25/2022 1006   BUN 4 (L) 03/05/2017 1110   CREATININE 1.62 (H) 01/25/2022 1006   CREATININE 0.76 04/28/2020 0826      Component Value Date/Time   CALCIUM 9.0 01/25/2022 1006   ALKPHOS 117 01/25/2022 1006   AST 19 01/25/2022 1006   ALT 10 01/25/2022 1006   BILITOT 0.3 01/25/2022 1006       RADIOGRAPHIC STUDIES: CT Chest Wo Contrast  Result Date: 01/25/2022 CLINICAL DATA:  Primary Cancer Type: Lung Imaging Indication: Routine surveillance Interval therapy since last imaging? No Initial Cancer Diagnosis Date: 09/11/2018; Established by: Biopsy-proven Detailed Pathology: Metastatic non-small cell lung cancer initially diagnosed as stage IIIA non-small cell lung cancer, adenocarcinoma. Primary Tumor location:  Right upper lobe. Recurrence? Yes; Date(s) of recurrence: 10/06/2020; Established by: Biopsy-proven Surgeries: Right upper lobectomy 09/14/2018. Appendectomy 12/07/2018. Coronary stent. Chemotherapy: Yes; Ongoing? No; Most recent administration: 07/12/2021 Immunotherapy?  Yes; Type: Keytruda; Ongoing? No Radiation therapy? Yes; Date Range: 10/24/2020; Target: Left scapula. * Tracking Code: BO * EXAM: CT CHEST, ABDOMEN AND PELVIS WITHOUT CONTRAST TECHNIQUE: Multidetector CT imaging of the chest, abdomen and pelvis was performed following the standard protocol without IV contrast. RADIATION DOSE REDUCTION: This exam was performed according to the departmental dose-optimization program which includes automated exposure control, adjustment of the mA and/or kV according to patient size and/or use of iterative reconstruction technique. COMPARISON:  Most recent CT chest, abdomen and pelvis 10/22/2021. 09/25/2020 PET-CT. FINDINGS: CT CHEST FINDINGS Cardiovascular: Extensive atherosclerosis of the aorta, great vessels and coronary arteries again noted. Right IJ Port-A-Cath extends to the superior cavoatrial junction. The heart size is normal.  There is no pericardial effusion. Mediastinum/Nodes: There are no enlarged mediastinal, hilar or axillary lymph nodes. Hilar assessment is limited by the lack of intravenous contrast, although the hilar contours appear unchanged. The thyroid gland, trachea and esophagus demonstrate no significant findings. Lungs/Pleura: Interval enlargement of a small dependent right pleural effusion. A small left pleural effusion has mildly improved. No definite pleural based masses on noncontrast imaging. Underlying moderate to severe centrilobular and paraseptal emphysema. Sequela of previous right upper lobe resection. Stable subpleural scarring in the left upper lobe. There is mildly increased compressive atelectasis in the left lower lobe. No suspicious pulmonary nodules. Musculoskeletal/Chest wall: Stable mixed lytic and sclerotic lesion in the left scapula without evidence  of pathologic fracture. Sclerosis of the left 2nd rib laterally, likely due to a healed fracture. A peripherally sclerotic lesion in the right 6th rib laterally (image 28/4) is unchanged. CT ABDOMEN AND PELVIS FINDINGS Hepatobiliary: The liver appears stable as imaged in the noncontrast state, without focally suspicious lesion. No evidence of gallstones, gallbladder wall thickening or biliary dilatation. Pancreas: Unremarkable. No pancreatic ductal dilatation or surrounding inflammatory changes. Spleen: Normal in size without focal abnormality. Adrenals/Urinary Tract: Both adrenal glands appear stable with mild hyperplasia but no focal abnormality. Stable small calcified lesion posteriorly in the interpolar region of the left kidney, consistent with a benign finding. No evidence of urinary tract calculus or hydronephrosis. The bladder appears normal for its degree of distention. Stomach/Bowel: Enteric contrast was administered and has passed into the distal colon. Diffuse gastric wall thickening is again noted, similar to prior studies. No bowel wall  thickening, significant distention or surrounding inflammation. There is moderate stool throughout the colon. Nonvisualization of the appendix, as before. Vascular/Lymphatic: There are no enlarged abdominal or pelvic lymph nodes. Aortic and branch vessel atherosclerosis with stable mild dilatation of the distal aorta having a maximal AP diameter of 2.6 cm; no focal aneurysm. Reproductive: The prostate gland and seminal vesicles appear unremarkable. Other: No evidence of abdominal wall mass or hernia. No ascites. Musculoskeletal: No acute or significant osseous findings. Mild lumbar spondylosis. IMPRESSION: 1. Interval fluctuation of small pleural effusions, increased in volume on the right and decreased on the left. No definite pleural based masses identified. 2. No other significant changes. Stable treated metastasis of the left scapular glenoid. No evidence of progressive metastatic disease. 3. Chronic gastric wall thickening. 4. Coronary and aortic atherosclerosis (ICD10-I70.0). Emphysema (ICD10-J43.9). Electronically Signed   By: Richardean Sale M.D.   On: 01/25/2022 12:10   CT Abdomen Pelvis Wo Contrast  Result Date: 01/25/2022 CLINICAL DATA:  Primary Cancer Type: Lung Imaging Indication: Routine surveillance Interval therapy since last imaging? No Initial Cancer Diagnosis Date: 09/11/2018; Established by: Biopsy-proven Detailed Pathology: Metastatic non-small cell lung cancer initially diagnosed as stage IIIA non-small cell lung cancer, adenocarcinoma. Primary Tumor location:  Right upper lobe. Recurrence? Yes; Date(s) of recurrence: 10/06/2020; Established by: Biopsy-proven Surgeries: Right upper lobectomy 09/14/2018. Appendectomy 12/07/2018. Coronary stent. Chemotherapy: Yes; Ongoing? No; Most recent administration: 07/12/2021 Immunotherapy?  Yes; Type: Keytruda; Ongoing? No Radiation therapy? Yes; Date Range: 10/24/2020; Target: Left scapula. * Tracking Code: BO * EXAM: CT CHEST, ABDOMEN AND PELVIS  WITHOUT CONTRAST TECHNIQUE: Multidetector CT imaging of the chest, abdomen and pelvis was performed following the standard protocol without IV contrast. RADIATION DOSE REDUCTION: This exam was performed according to the departmental dose-optimization program which includes automated exposure control, adjustment of the mA and/or kV according to patient size and/or use of iterative reconstruction technique. COMPARISON:  Most recent CT chest, abdomen and pelvis 10/22/2021. 09/25/2020 PET-CT. FINDINGS: CT CHEST FINDINGS Cardiovascular: Extensive atherosclerosis of the aorta, great vessels and coronary arteries again noted. Right IJ Port-A-Cath extends to the superior cavoatrial junction. The heart size is normal. There is no pericardial effusion. Mediastinum/Nodes: There are no enlarged mediastinal, hilar or axillary lymph nodes. Hilar assessment is limited by the lack of intravenous contrast, although the hilar contours appear unchanged. The thyroid gland, trachea and esophagus demonstrate no significant findings. Lungs/Pleura: Interval enlargement of a small dependent right pleural effusion. A small left pleural effusion has mildly improved. No definite pleural based masses on noncontrast imaging. Underlying moderate to severe centrilobular and paraseptal emphysema. Sequela of previous right upper  lobe resection. Stable subpleural scarring in the left upper lobe. There is mildly increased compressive atelectasis in the left lower lobe. No suspicious pulmonary nodules. Musculoskeletal/Chest wall: Stable mixed lytic and sclerotic lesion in the left scapula without evidence of pathologic fracture. Sclerosis of the left 2nd rib laterally, likely due to a healed fracture. A peripherally sclerotic lesion in the right 6th rib laterally (image 28/4) is unchanged. CT ABDOMEN AND PELVIS FINDINGS Hepatobiliary: The liver appears stable as imaged in the noncontrast state, without focally suspicious lesion. No evidence of  gallstones, gallbladder wall thickening or biliary dilatation. Pancreas: Unremarkable. No pancreatic ductal dilatation or surrounding inflammatory changes. Spleen: Normal in size without focal abnormality. Adrenals/Urinary Tract: Both adrenal glands appear stable with mild hyperplasia but no focal abnormality. Stable small calcified lesion posteriorly in the interpolar region of the left kidney, consistent with a benign finding. No evidence of urinary tract calculus or hydronephrosis. The bladder appears normal for its degree of distention. Stomach/Bowel: Enteric contrast was administered and has passed into the distal colon. Diffuse gastric wall thickening is again noted, similar to prior studies. No bowel wall thickening, significant distention or surrounding inflammation. There is moderate stool throughout the colon. Nonvisualization of the appendix, as before. Vascular/Lymphatic: There are no enlarged abdominal or pelvic lymph nodes. Aortic and branch vessel atherosclerosis with stable mild dilatation of the distal aorta having a maximal AP diameter of 2.6 cm; no focal aneurysm. Reproductive: The prostate gland and seminal vesicles appear unremarkable. Other: No evidence of abdominal wall mass or hernia. No ascites. Musculoskeletal: No acute or significant osseous findings. Mild lumbar spondylosis. IMPRESSION: 1. Interval fluctuation of small pleural effusions, increased in volume on the right and decreased on the left. No definite pleural based masses identified. 2. No other significant changes. Stable treated metastasis of the left scapular glenoid. No evidence of progressive metastatic disease. 3. Chronic gastric wall thickening. 4. Coronary and aortic atherosclerosis (ICD10-I70.0). Emphysema (ICD10-J43.9). Electronically Signed   By: Richardean Sale M.D.   On: 01/25/2022 12:10    ASSESSMENT AND PLAN: This is a very pleasant 66 years old white male with a stage IIIB non-small cell lung cancer,  adenocarcinoma with no actionable mutations status post right upper lobectomy with lymph node dissection under the care of [name]. The patient completed a course of adjuvant treatment with cisplatin and Alimta status post 4 cycles.  He tolerated the previous 4 cycles of his treatment fairly well. The patient has been in observation but the recent imaging studies showed suspicious bone lesion in the left shoulder area. I ordered a PET scan which was performed on 09/25/2020 and it showed metastatic disease involving the left scapula and left supraclavicular lymph nodes. The patient had ultrasound-guided core biopsy of the left supraclavicular lymph node and it was positive for metastatic carcinoma. He underwent palliative radiotherapy to the metastatic bone lesion in the left scapula and he is feeling much better. PD-L1 expression 20%. He was tested in the past for molecular study by [facility] 1 and it was reported to be negative for actionable mutations. The patient started systemic chemotherapy with carboplatin for AUC of 5, Alimta 500 Mg/M2 and Keytruda 200 Mg IV every 3 weeks status post 12 cycles. Starting from cycle #5 he is treated with maintenance Alimta and Keytruda every 3 weeks.  His dose of Alimta was reduced to 400 Mg/M2 starting from cycle #5 because of worsening anemia and Keytruda was also discontinued starting cycle #11 secondary to suspicious immunotherapy induced gastritis.  His treatment was discontinued after cycle #12 secondary to intolerance. The patient is currently on observation since December 2022.   The patient is doing fine today with no concerning complaints except for the baseline fatigue secondary to anemia. He had repeat CT scan of the chest, abdomen pelvis performed recently.  I personally and independently reviewed the scan images and discussed the result with the patient and his wife. His scan showed no concerning clear finding for disease progression. I  recommended for him to continue on observation with repeat CT scan of the chest, abdomen pelvis in 4 months. I will also arrange for the patient to have Port-A-Cath flush every 2 months. For the anemia, I will continue with the multivitamins and I advised him to start taking over-the-counter oral iron tablet with vitamin C. The patient was advised to call immediately if he has any other concerning symptoms in the interval. The patient voices understanding of current disease status and treatment options and is in agreement with the current care plan.  All questions were answered. The patient knows to call the clinic with any problems, questions or concerns. We can certainly see the patient much sooner if necessary.  The total time spent in the appointment was 30 minutes.  Disclaimer: This note was dictated with voice recognition software. Similar sounding words can inadvertently be transcribed and may not be corrected upon review.

## 2022-02-05 ENCOUNTER — Telehealth: Payer: Self-pay | Admitting: Internal Medicine

## 2022-02-05 NOTE — Telephone Encounter (Signed)
Scheduled per 07/05 los, patient has been called and voicemail was left.

## 2022-02-13 ENCOUNTER — Encounter: Payer: BC Managed Care – PPO | Admitting: Dietician

## 2022-02-18 ENCOUNTER — Other Ambulatory Visit: Payer: Self-pay

## 2022-02-25 ENCOUNTER — Telehealth: Payer: Self-pay | Admitting: Pulmonary Disease

## 2022-02-25 MED ORDER — TRELEGY ELLIPTA 100-62.5-25 MCG/ACT IN AEPB: 1.0000 | INHALATION_SPRAY | Freq: Every day | RESPIRATORY_TRACT | 3 refills | Status: DC

## 2022-02-25 NOTE — Telephone Encounter (Signed)
Called Sean Rose and verified medication that she wanted refilled. Verified pharmacy as well. Nothing further needed

## 2022-03-13 ENCOUNTER — Observation Stay (HOSPITAL_COMMUNITY)
Admission: EM | Admit: 2022-03-13 | Discharge: 2022-03-14 | Disposition: A | Discharge status: Home / Self Care | Payer: BC Managed Care – PPO | Attending: Family Medicine | Admitting: Family Medicine

## 2022-03-13 ENCOUNTER — Emergency Department (HOSPITAL_COMMUNITY): Payer: BC Managed Care – PPO

## 2022-03-13 DIAGNOSIS — S0083XA Contusion of other part of head, initial encounter: Secondary | ICD-10-CM | POA: Insufficient documentation

## 2022-03-13 DIAGNOSIS — E871 Hypo-osmolality and hyponatremia: Secondary | ICD-10-CM | POA: Diagnosis not present

## 2022-03-13 DIAGNOSIS — Z87891 Personal history of nicotine dependence: Secondary | ICD-10-CM | POA: Diagnosis not present

## 2022-03-13 DIAGNOSIS — Z7982 Long term (current) use of aspirin: Secondary | ICD-10-CM | POA: Diagnosis not present

## 2022-03-13 DIAGNOSIS — Z955 Presence of coronary angioplasty implant and graft: Secondary | ICD-10-CM | POA: Insufficient documentation

## 2022-03-13 DIAGNOSIS — W19XXXA Unspecified fall, initial encounter: Secondary | ICD-10-CM | POA: Diagnosis not present

## 2022-03-13 DIAGNOSIS — Z79899 Other long term (current) drug therapy: Secondary | ICD-10-CM | POA: Insufficient documentation

## 2022-03-13 DIAGNOSIS — M542 Cervicalgia: Secondary | ICD-10-CM | POA: Diagnosis not present

## 2022-03-13 DIAGNOSIS — C3491 Malignant neoplasm of unspecified part of right bronchus or lung: Secondary | ICD-10-CM | POA: Diagnosis not present

## 2022-03-13 DIAGNOSIS — R519 Headache, unspecified: Secondary | ICD-10-CM | POA: Diagnosis not present

## 2022-03-13 DIAGNOSIS — Z7902 Long term (current) use of antithrombotics/antiplatelets: Secondary | ICD-10-CM | POA: Insufficient documentation

## 2022-03-13 DIAGNOSIS — D6481 Anemia due to antineoplastic chemotherapy: Secondary | ICD-10-CM | POA: Diagnosis not present

## 2022-03-13 DIAGNOSIS — E876 Hypokalemia: Secondary | ICD-10-CM | POA: Insufficient documentation

## 2022-03-13 DIAGNOSIS — I2510 Atherosclerotic heart disease of native coronary artery without angina pectoris: Secondary | ICD-10-CM | POA: Diagnosis not present

## 2022-03-13 DIAGNOSIS — R55 Syncope and collapse: Principal | ICD-10-CM | POA: Insufficient documentation

## 2022-03-13 DIAGNOSIS — R531 Weakness: Secondary | ICD-10-CM | POA: Diagnosis not present

## 2022-03-13 DIAGNOSIS — I10 Essential (primary) hypertension: Secondary | ICD-10-CM | POA: Diagnosis present

## 2022-03-13 DIAGNOSIS — J439 Emphysema, unspecified: Secondary | ICD-10-CM | POA: Diagnosis not present

## 2022-03-13 DIAGNOSIS — R58 Hemorrhage, not elsewhere classified: Secondary | ICD-10-CM | POA: Diagnosis not present

## 2022-03-13 DIAGNOSIS — Z95820 Peripheral vascular angioplasty status with implants and grafts: Secondary | ICD-10-CM | POA: Diagnosis not present

## 2022-03-13 DIAGNOSIS — J449 Chronic obstructive pulmonary disease, unspecified: Secondary | ICD-10-CM | POA: Diagnosis not present

## 2022-03-13 DIAGNOSIS — Y92002 Bathroom of unspecified non-institutional (private) residence single-family (private) house as the place of occurrence of the external cause: Secondary | ICD-10-CM | POA: Diagnosis not present

## 2022-03-13 DIAGNOSIS — N183 Chronic kidney disease, stage 3 unspecified: Secondary | ICD-10-CM | POA: Diagnosis not present

## 2022-03-13 DIAGNOSIS — J90 Pleural effusion, not elsewhere classified: Secondary | ICD-10-CM | POA: Diagnosis not present

## 2022-03-13 DIAGNOSIS — I129 Hypertensive chronic kidney disease with stage 1 through stage 4 chronic kidney disease, or unspecified chronic kidney disease: Secondary | ICD-10-CM | POA: Insufficient documentation

## 2022-03-13 DIAGNOSIS — G4489 Other headache syndrome: Secondary | ICD-10-CM | POA: Diagnosis not present

## 2022-03-13 DIAGNOSIS — T679XXA Effect of heat and light, unspecified, initial encounter: Secondary | ICD-10-CM | POA: Diagnosis not present

## 2022-03-13 LAB — CBC WITH DIFFERENTIAL/PLATELET
Abs Immature Granulocytes: 0.06 10*3/uL (ref 0.00–0.07)
Basophils Absolute: 0.1 10*3/uL (ref 0.0–0.1)
Basophils Relative: 1 %
Eosinophils Absolute: 0.4 10*3/uL (ref 0.0–0.5)
Eosinophils Relative: 3 %
HCT: 26.4 % — ABNORMAL LOW (ref 39.0–52.0)
Hemoglobin: 9.6 g/dL — ABNORMAL LOW (ref 13.0–17.0)
Immature Granulocytes: 1 %
Lymphocytes Relative: 18 %
Lymphs Abs: 2 10*3/uL (ref 0.7–4.0)
MCH: 33.3 pg (ref 26.0–34.0)
MCHC: 36.4 g/dL — ABNORMAL HIGH (ref 30.0–36.0)
MCV: 91.7 fL (ref 80.0–100.0)
Monocytes Absolute: 1.2 10*3/uL — ABNORMAL HIGH (ref 0.1–1.0)
Monocytes Relative: 11 %
Neutro Abs: 7.4 10*3/uL (ref 1.7–7.7)
Neutrophils Relative %: 66 %
Platelets: 534 10*3/uL — ABNORMAL HIGH (ref 150–400)
RBC: 2.88 MIL/uL — ABNORMAL LOW (ref 4.22–5.81)
RDW: 13.6 % (ref 11.5–15.5)
WBC: 11 10*3/uL — ABNORMAL HIGH (ref 4.0–10.5)
nRBC: 0 % (ref 0.0–0.2)

## 2022-03-13 LAB — BASIC METABOLIC PANEL
Anion gap: 11 (ref 5–15)
BUN: 16 mg/dL (ref 8–23)
CO2: 23 mmol/L (ref 22–32)
Calcium: 8.6 mg/dL — ABNORMAL LOW (ref 8.9–10.3)
Chloride: 89 mmol/L — ABNORMAL LOW (ref 98–111)
Creatinine, Ser: 1.73 mg/dL — ABNORMAL HIGH (ref 0.61–1.24)
GFR, Estimated: 43 mL/min — ABNORMAL LOW (ref >60)
Glucose, Bld: 97 mg/dL (ref 70–99)
Potassium: 3.1 mmol/L — ABNORMAL LOW (ref 3.5–5.1)
Sodium: 123 mmol/L — ABNORMAL LOW (ref 135–145)

## 2022-03-13 MED ORDER — OXYCODONE-ACETAMINOPHEN 5-325 MG PO TABS
1.0000 | ORAL_TABLET | Freq: Once | ORAL | Status: DC
  Filled 2022-03-13: qty 1

## 2022-03-13 MED ORDER — MORPHINE SULFATE (PF) 4 MG/ML IV SOLN
4.0000 mg | Freq: Once | INTRAVENOUS | Status: AC
  Administered 2022-03-13: 4 mg via INTRAVENOUS
  Filled 2022-03-13: qty 1

## 2022-03-13 MED ORDER — POTASSIUM CHLORIDE CRYS ER 20 MEQ PO TBCR
40.0000 meq | EXTENDED_RELEASE_TABLET | Freq: Once | ORAL | Status: AC
  Administered 2022-03-14: 40 meq via ORAL
  Filled 2022-03-13: qty 2

## 2022-03-13 MED ORDER — ACETAMINOPHEN 325 MG PO TABS: 650.0000 mg | ORAL_TABLET | Freq: Four times a day (QID) | ORAL | Status: DC | PRN

## 2022-03-13 MED ORDER — ONDANSETRON HCL 4 MG PO TABS: 4.0000 mg | ORAL_TABLET | Freq: Four times a day (QID) | ORAL | Status: DC | PRN

## 2022-03-13 MED ORDER — MORPHINE SULFATE (PF) 2 MG/ML IV SOLN
2.0000 mg | INTRAVENOUS | Status: DC | PRN
  Administered 2022-03-14: 2 mg via INTRAVENOUS
  Administered 2022-03-14: 4 mg via INTRAVENOUS
  Filled 2022-03-13: qty 2
  Filled 2022-03-13: qty 1

## 2022-03-13 MED ORDER — ONDANSETRON HCL 4 MG/2ML IJ SOLN: 4.0000 mg | Freq: Four times a day (QID) | INTRAMUSCULAR | Status: DC | PRN

## 2022-03-13 MED ORDER — FENTANYL CITRATE PF 50 MCG/ML IJ SOSY
50.0000 ug | PREFILLED_SYRINGE | Freq: Once | INTRAMUSCULAR | Status: AC
  Administered 2022-03-13: 50 ug via INTRAVENOUS
  Filled 2022-03-13: qty 1

## 2022-03-13 MED ORDER — ENOXAPARIN SODIUM 30 MG/0.3ML IJ SOSY: 30.0000 mg | PREFILLED_SYRINGE | INTRAMUSCULAR | Status: DC

## 2022-03-13 MED ORDER — ACETAMINOPHEN 650 MG RE SUPP: 650.0000 mg | Freq: Four times a day (QID) | RECTAL | Status: DC | PRN

## 2022-03-13 MED ORDER — ONDANSETRON HCL 4 MG/2ML IJ SOLN
4.0000 mg | Freq: Once | INTRAMUSCULAR | Status: AC
  Administered 2022-03-13: 4 mg via INTRAVENOUS
  Filled 2022-03-13: qty 2

## 2022-03-13 MED ORDER — LACTATED RINGERS IV BOLUS
1000.0000 mL | Freq: Once | INTRAVENOUS | Status: AC
  Administered 2022-03-13: 1000 mL via INTRAVENOUS

## 2022-03-13 MED ORDER — ENOXAPARIN SODIUM 40 MG/0.4ML IJ SOSY: 40.0000 mg | PREFILLED_SYRINGE | INTRAMUSCULAR | Status: DC

## 2022-03-13 MED ORDER — SODIUM CHLORIDE 0.9% FLUSH
3.0000 mL | Freq: Two times a day (BID) | INTRAVENOUS | Status: DC
  Administered 2022-03-14: 3 mL via INTRAVENOUS

## 2022-03-13 NOTE — ED Notes (Signed)
Dr. Gardner at bedside 

## 2022-03-13 NOTE — ED Notes (Signed)
Patient attempted to walk with assistance from RN and wife. Patient unable to walk, patient only able to stand and felt wobbly and had to sit back down.

## 2022-03-13 NOTE — ED Provider Notes (Signed)
[facility] EMERGENCY DEPARTMENT Provider Note   CSN: [id number] Arrival date & time: 03/13/22  1737     History  Chief Complaint  Patient presents with   Trauma    Lvl 2   Fall    Sean Rose is a 67 y.o. male.  Patient is a 66 year old male with a history of CAD, hypertension, PAD, prior tobacco use, COPD, stage IV lung cancer which she reports is terminal but no longer on any chemotherapy due to severe adverse reactions who is presenting today after a syncopal episode resulting in facial and head trauma in the setting of being on Plavix.  Patient was a level 2 trauma based on this criteria.  Patient reports that his air conditioner was broken today and they had it turned off at 7 AM this morning.  His house was very warm he reported greater than 80 degrees and he had not been feeling well today.  He had to go to the bathroom and got up and use the restroom but reports on his way back from the bathroom he started feeling very lightheaded and faint.  He thought he was going to be able to make it but did not and fell hitting his face on the floor which he does remember but is not sure if he hit something on his way down.  Family did report a brief loss of consciousness but he was awake and lucid when EMS arrived.  They noted his blood pressure on arrival was normal.  Patient reports generally he feels unwell and has a severe headache with some facial pain.  He denies any pain in his chest, abdomen, upper or lower extremities.  No numbness or tingling in his extremities.  He reports very minimal tenderness at the base of his skull and the top part of his neck.  He does not feel like it is uncomfortable to bite down or open his mouth.  He does report that he lost a bunch of weight with the cancer and he does feel lightheaded often when he gets up but does not usually lose consciousness.  The history is provided by the patient.  Trauma Mechanism of injury: Fall  Fall        Home Medications Prior to Admission medications   Medication Sig Start Date End Date Taking? Authorizing Provider  acetaminophen (TYLENOL) 500 MG tablet Take 1,000 mg by mouth every 6 (six) hours as needed for headache.    [provider]  amLODipine (NORVASC) 5 MG tablet Take 1 tablet (5 mg total) by mouth daily. 04/06/21 01/05/22  Warren Lacy, PA-C  aspirin EC 81 MG tablet Take 81 mg by mouth daily.    [provider]  clopidogrel (PLAVIX) 75 MG tablet TAKE 1 TABLET BY MOUTH EVERY DAY Patient taking differently: Take 75 mg by mouth daily. 09/24/21   Lorretta Harp, MD  esomeprazole (NEXIUM) 20 MG capsule Take 40 mg by mouth daily.    [provider]  fexofenadine (ALLEGRA) 60 MG tablet Take 60 mg by mouth daily.    [provider]  Fluticasone-Umeclidin-Vilant (TRELEGY ELLIPTA) 100-62.5-25 MCG/ACT AEPB Inhale 1 puff into the lungs daily. 02/25/22   Icard, Octavio Graves, DO  folic acid (FOLVITE) 1 MG tablet TAKE 1 TABLET(1 MG) BY MOUTH DAILY 01/21/22   Heilingoetter, Cassandra L, PA-C  ibuprofen (ADVIL) 200 MG tablet Take 400 mg by mouth every 6 (six) hours as needed for headache or mild pain. Patient not taking:  Reported on 01/30/2022    [provider]  lidocaine-prilocaine (EMLA) cream Apply to the Port-A-Cath site 30-60 minutes before treatment. 10/30/20   [name], MD  Magnesium 500 MG TABS Take 1 tablet (500 mg total) by mouth 3 (three) times daily. 03/02/21   Nita Sells, MD  metoprolol succinate (TOPROL XL) 25 MG 24 hr tablet Take 1 tablet (25 mg total) by mouth daily. 04/06/21   Warren Lacy, PA-C  Multiple Vitamin (MULTIVITAMIN WITH MINERALS) TABS tablet Take 1 tablet by mouth daily.    [provider]  nitroGLYCERIN (NITROSTAT) 0.4 MG SL tablet PLACE 1 TABLET UNDER THE TONGUE EVERY 5 MINUTES AS NEEDED FOR CHEST PAIN Patient not taking: Reported on 01/30/2022 09/24/21   Lorretta Harp, MD  ondansetron (ZOFRAN) 8  MG tablet TAKE 1 TABLET(8 MG) BY MOUTH EVERY 8 HOURS AS NEEDED FOR NAUSEA OR VOMITING 08/23/21   [name], MD  promethazine (PHENERGAN) 12.5 MG tablet Take 1 tablet (12.5 mg total) by mouth every 6 (six) hours as needed for nausea or vomiting. 06/01/21   Walisiewicz, Kaitlyn E, PA-C  rosuvastatin (CRESTOR) 5 MG tablet TAKE 1 TABLET(5 MG) BY MOUTH EVERY OTHER DAY Patient taking differently: Take 5 mg by mouth every other day. 07/02/21   Lorretta Harp, MD      Allergies    Compazine [prochlorperazine]    Review of Systems   Review of Systems  Physical Exam Updated Vital Signs BP 122/80   Pulse 71   Temp 99.3 F (37.4 C) (Oral)   Resp 20   Ht 5' 7.5" (1.715 m)   Wt 48.1 kg   SpO2 99%   BMI 16.36 kg/m  Physical Exam Vitals and nursing note reviewed.  Constitutional:      General: He is not in acute distress.    Appearance: He is well-developed.  HENT:     Head: Normocephalic.      Comments: No septal hematomas Eyes:     Conjunctiva/sclera: Conjunctivae normal.     Pupils: Pupils are equal, round, and reactive to light.  Neck:     Comments: C-collar in place with some mild tenderness in the upper cervical spine Cardiovascular:     Rate and Rhythm: Normal rate and regular rhythm.     Heart sounds: No murmur heard. Pulmonary:     Effort: Pulmonary effort is normal. No respiratory distress.     Breath sounds: Normal breath sounds. No wheezing or rales.     Comments: Port present in the right upper chest Abdominal:     General: There is no distension.     Palpations: Abdomen is soft.     Tenderness: There is no abdominal tenderness. There is no guarding or rebound.  Musculoskeletal:        General: Normal range of motion.     Cervical back: Tenderness present.     Right lower leg: No edema.     Left lower leg: No edema.  Skin:    General: Skin is warm and dry.     Findings: No erythema or rash.  Neurological:     Mental Status: He is alert and oriented to  person, place, and time. Mental status is at baseline.     Sensory: No sensory deficit.     Motor: No weakness.     Comments: 5 out of 5 strength in bilateral upper and lower extremities with normal sensation  Psychiatric:        Behavior: Behavior normal.  ED Results / Procedures / Treatments   Labs (all labs ordered are listed, but only abnormal results are displayed) Labs Reviewed  CBC WITH DIFFERENTIAL/PLATELET - Abnormal; Notable for the following components:      Result Value   WBC 11.0 (*)    RBC 2.88 (*)    Hemoglobin 9.6 (*)    HCT 26.4 (*)    MCHC 36.4 (*)    Platelets 534 (*)    Monocytes Absolute 1.2 (*)    All other components within normal limits  BASIC METABOLIC PANEL - Abnormal; Notable for the following components:   Sodium 123 (*)    Potassium 3.1 (*)    Chloride 89 (*)    Creatinine, Ser 1.73 (*)    Calcium 8.6 (*)    GFR, Estimated 43 (*)    All other components within normal limits  SODIUM, URINE, RANDOM  OSMOLALITY, URINE  OSMOLALITY  MAGNESIUM  HEPATIC FUNCTION PANEL  URINALYSIS, COMPLETE (UACMP) WITH MICROSCOPIC    EKG EKG Interpretation  Date/Time:  Wednesday March 13 2022 18:44:52 EDT Ventricular Rate:  78 PR Interval:  125 QRS Duration: 88 QT Interval:  364 QTC Calculation: 415 R Axis:   65 Text Interpretation: Sinus rhythm Normal ECG Confirmed by Blanchie Dessert [phone]) on 03/13/2022 6:54:34 PM  Radiology CT Head Wo Contrast  Result Date: 03/13/2022 CLINICAL DATA:  Recent fall with headaches and neck pain, initial encounter EXAM: CT HEAD WITHOUT CONTRAST CT MAXILLOFACIAL WITHOUT CONTRAST CT CERVICAL SPINE WITHOUT CONTRAST TECHNIQUE: Multidetector CT imaging of the head, cervical spine, and maxillofacial structures were performed using the standard protocol without intravenous contrast. Multiplanar CT image reconstructions of the cervical spine and maxillofacial structures were also generated. RADIATION DOSE REDUCTION: This exam  was performed according to the departmental dose-optimization program which includes automated exposure control, adjustment of the mA and/or kV according to patient size and/or use of iterative reconstruction technique. COMPARISON:  11/07/2021 FINDINGS: CT HEAD FINDINGS Brain: No evidence of acute infarction, hemorrhage, hydrocephalus, extra-axial collection or mass lesion/mass effect. Mild chronic atrophic and ischemic changes are noted. Vascular: No hyperdense vessel or unexpected calcification. Skull: Normal. Negative for fracture or focal lesion. Other: None. CT MAXILLOFACIAL FINDINGS Osseous: No acute fracture is identified. Orbits: Orbits and their contents are within normal limits. Sinuses: Paranasal sinuses are well aerated. Soft tissues: Soft tissue laceration is noted over the bridge of the nose although no underlying bony abnormality is noted. CT CERVICAL SPINE FINDINGS Alignment: Within normal limits. Skull base and vertebrae: Cervical segments are well visualized. Mild retrolisthesis of C4 with respect to C3 and C5 is noted of a degenerative nature. No acute fracture or acute facet abnormality is noted. Mild osteophytic changes and facet hypertrophic changes are noted. The odontoid is within normal limits. Soft tissues and spinal canal: Surrounding soft tissue structures demonstrate diffuse vascular calcifications. No focal hematoma or adenopathy is noted. Upper chest: Visualized lung apices show scarring bilaterally as well as emphysematous changes. Other: None IMPRESSION: CT of the head: Chronic atrophic and ischemic changes without acute abnormality. CT of the maxillofacial bones: Soft tissue laceration over the nasal bridge although no underlying bony abnormality is noted. CT of the cervical spine: Degenerative change without acute abnormality. Electronically Signed   By: Inez Catalina M.D.   On: 03/13/2022 20:30   CT Cervical Spine Wo Contrast  Result Date: 03/13/2022 CLINICAL DATA:  Recent fall  with headaches and neck pain, initial encounter EXAM: CT HEAD WITHOUT CONTRAST CT MAXILLOFACIAL WITHOUT CONTRAST CT  CERVICAL SPINE WITHOUT CONTRAST TECHNIQUE: Multidetector CT imaging of the head, cervical spine, and maxillofacial structures were performed using the standard protocol without intravenous contrast. Multiplanar CT image reconstructions of the cervical spine and maxillofacial structures were also generated. RADIATION DOSE REDUCTION: This exam was performed according to the departmental dose-optimization program which includes automated exposure control, adjustment of the mA and/or kV according to patient size and/or use of iterative reconstruction technique. COMPARISON:  11/07/2021 FINDINGS: CT HEAD FINDINGS Brain: No evidence of acute infarction, hemorrhage, hydrocephalus, extra-axial collection or mass lesion/mass effect. Mild chronic atrophic and ischemic changes are noted. Vascular: No hyperdense vessel or unexpected calcification. Skull: Normal. Negative for fracture or focal lesion. Other: None. CT MAXILLOFACIAL FINDINGS Osseous: No acute fracture is identified. Orbits: Orbits and their contents are within normal limits. Sinuses: Paranasal sinuses are well aerated. Soft tissues: Soft tissue laceration is noted over the bridge of the nose although no underlying bony abnormality is noted. CT CERVICAL SPINE FINDINGS Alignment: Within normal limits. Skull base and vertebrae: Cervical segments are well visualized. Mild retrolisthesis of C4 with respect to C3 and C5 is noted of a degenerative nature. No acute fracture or acute facet abnormality is noted. Mild osteophytic changes and facet hypertrophic changes are noted. The odontoid is within normal limits. Soft tissues and spinal canal: Surrounding soft tissue structures demonstrate diffuse vascular calcifications. No focal hematoma or adenopathy is noted. Upper chest: Visualized lung apices show scarring bilaterally as well as emphysematous changes.  Other: None IMPRESSION: CT of the head: Chronic atrophic and ischemic changes without acute abnormality. CT of the maxillofacial bones: Soft tissue laceration over the nasal bridge although no underlying bony abnormality is noted. CT of the cervical spine: Degenerative change without acute abnormality. Electronically Signed   By: Inez Catalina M.D.   On: 03/13/2022 20:30   CT Maxillofacial Wo Contrast  Result Date: 03/13/2022 CLINICAL DATA:  Recent fall with headaches and neck pain, initial encounter EXAM: CT HEAD WITHOUT CONTRAST CT MAXILLOFACIAL WITHOUT CONTRAST CT CERVICAL SPINE WITHOUT CONTRAST TECHNIQUE: Multidetector CT imaging of the head, cervical spine, and maxillofacial structures were performed using the standard protocol without intravenous contrast. Multiplanar CT image reconstructions of the cervical spine and maxillofacial structures were also generated. RADIATION DOSE REDUCTION: This exam was performed according to the departmental dose-optimization program which includes automated exposure control, adjustment of the mA and/or kV according to patient size and/or use of iterative reconstruction technique. COMPARISON:  11/07/2021 FINDINGS: CT HEAD FINDINGS Brain: No evidence of acute infarction, hemorrhage, hydrocephalus, extra-axial collection or mass lesion/mass effect. Mild chronic atrophic and ischemic changes are noted. Vascular: No hyperdense vessel or unexpected calcification. Skull: Normal. Negative for fracture or focal lesion. Other: None. CT MAXILLOFACIAL FINDINGS Osseous: No acute fracture is identified. Orbits: Orbits and their contents are within normal limits. Sinuses: Paranasal sinuses are well aerated. Soft tissues: Soft tissue laceration is noted over the bridge of the nose although no underlying bony abnormality is noted. CT CERVICAL SPINE FINDINGS Alignment: Within normal limits. Skull base and vertebrae: Cervical segments are well visualized. Mild retrolisthesis of C4 with  respect to C3 and C5 is noted of a degenerative nature. No acute fracture or acute facet abnormality is noted. Mild osteophytic changes and facet hypertrophic changes are noted. The odontoid is within normal limits. Soft tissues and spinal canal: Surrounding soft tissue structures demonstrate diffuse vascular calcifications. No focal hematoma or adenopathy is noted. Upper chest: Visualized lung apices show scarring bilaterally as well as emphysematous changes. Other: None  IMPRESSION: CT of the head: Chronic atrophic and ischemic changes without acute abnormality. CT of the maxillofacial bones: Soft tissue laceration over the nasal bridge although no underlying bony abnormality is noted. CT of the cervical spine: Degenerative change without acute abnormality. Electronically Signed   By: Inez Catalina M.D.   On: 03/13/2022 20:30   DG Chest Port 1 View  Result Date: 03/13/2022 CLINICAL DATA:  Syncope; trauma; fall EXAM: PORTABLE CHEST 1 VIEW COMPARISON:  Radiographs 11/07/2021 FINDINGS: Right chest wall Port-A-Cath tip in the right atrium. Postoperative changes about the right upper lung and hilum. Stable cardiomediastinal silhouette. Aortic atherosclerotic calcification. Biapical pleuroparenchymal scarring. Increased small layering bilateral pleural effusions, greater on the right. Associated airspace opacities with associated atelectasis/consolidation. Coarse interstitial markings in both lungs with emphysematous change in the upper lobes. No pneumothorax. No acute osseous abnormality. Similar sclerosis about the left glenoid compatible with treated metastatic disease. IMPRESSION: Increased small right-greater-than-left pleural effusions with associated atelectasis/consolidation since radiographs 11/07/2021. Biapical scarring and emphysematous change. Electronically Signed   By: Placido Sou M.D.   On: 03/13/2022 18:11    Procedures Procedures    Medications Ordered in ED Medications   oxyCODONE-acetaminophen (PERCOCET/ROXICET) 5-325 MG per tablet 1 tablet (has no administration in time range)  potassium chloride SA (KLOR-CON M) CR tablet 40 mEq (has no administration in time range)  fentaNYL (SUBLIMAZE) injection 50 mcg (50 mcg Intravenous Given 03/13/22 1813)  ondansetron (ZOFRAN) injection 4 mg (4 mg Intravenous Given 03/13/22 1809)  lactated ringers bolus 1,000 mL (0 mLs Intravenous Stopped 03/13/22 1937)  fentaNYL (SUBLIMAZE) injection 50 mcg (50 mcg Intravenous Given 03/13/22 2006)  morphine (PF) 4 MG/ML injection 4 mg (4 mg Intravenous Given 03/13/22 2133)    ED Course/ Medical Decision Making/ A&P                           Medical Decision Making Amount and/or Complexity of Data Reviewed Labs: ordered. Radiology: ordered.  Risk Prescription drug management.   Pt with multiple medical problems and comorbidities and presenting today with a complaint that caries a high risk for morbidity and mortality.  Presenting today after syncope at home and then injury to the face and head.  Patient does take Plavix but is on no other anticoagulation.  Patient reports feeling unwell at this time with headache.  Also was noted to be in a house today that was greater than 80 degrees as they were fixing his air conditioning.  This may have added into the cause of his syncope.  He reports since losing 40 pounds with having cancer and not tolerating the treatments he really has not gained the weight back and often times will have some lightheadedness when he gets up and moves around but does not typically pass out.  Patient's blood pressure here is normal.  Exam is reassuring as he is neurovascularly intact.  Concern for possible head or facial trauma.  Patient given pain control and antiemetics as he did have 1 episode of emesis after he woke up.  Low suspicion for stroke today.  We will get imaging of the head face and spine to ensure no acute fracture.  Also will ensure normal  electrolytes.  Low suspicion for infection at this time.  Patient is denying any palpitations, chest pain or shortness of breath that is different from his baseline and lower suspicion for PE today as the cause of his syncope.  10:55 PM I have independently  visualized and interpreted pt's images today.  Chest x-ray today with persistent effusions in bilateral lobes worse than prior chest x-rays.  Head CT without evidence of acute bleed, maxillofacial CT without evidence of facial fractures and cervical spine without evidence of cervical fracture.  Radiology reports no acute changes.  Degenerative changes seen in the spine without acute abnormalities.  I independently interpreted patient's labs today and CBC with minimal leukocytosis of 11 with a stable hemoglobin of 9.6 and elevated platelets which are chronic, patient's BMP today with worsening hyponatremia now down to 123 from 125 in June which has been trending down.  Hypokalemia of 3.1.  Unchanged renal function with a creatinine of 1.73.  Speaking with the patient he does not drink excessive amounts of water.  He does drink liquids throughout the day and eats some but is not a big eater.  He does drink a few glasses of wine daily but does not drink any beer.  Suspect his hyponatremia is related to his lung cancer.  On repeat evaluation patient is still having severe headache and facial pain.  He reports he does not feel well.  Patient normally ambulates around his house without assistance.  Will see if patient is able to stand or walk here today.  If not we will plan on admitting for syncope, hyponatremia and generalized weakness. Pt was not able to get out of bed independently and was unable to walk due to feeling light headed and weak.  Will admit for the above.          Final Clinical Impression(s) / ED Diagnoses Final diagnoses:  Syncope, unspecified syncope type  Hyponatremia  Contusion of face, initial encounter  Generalized weakness     Rx / DC Orders ED Discharge Orders     None         Blanchie Dessert, MD 03/13/22 2255

## 2022-03-14 ENCOUNTER — Other Ambulatory Visit: Payer: Self-pay

## 2022-03-14 ENCOUNTER — Encounter: Payer: Self-pay | Admitting: Internal Medicine

## 2022-03-14 ENCOUNTER — Observation Stay (HOSPITAL_COMMUNITY): Payer: BC Managed Care – PPO

## 2022-03-14 DIAGNOSIS — R55 Syncope and collapse: Secondary | ICD-10-CM

## 2022-03-14 DIAGNOSIS — N183 Chronic kidney disease, stage 3 unspecified: Secondary | ICD-10-CM | POA: Diagnosis not present

## 2022-03-14 DIAGNOSIS — J449 Chronic obstructive pulmonary disease, unspecified: Secondary | ICD-10-CM

## 2022-03-14 DIAGNOSIS — I10 Essential (primary) hypertension: Secondary | ICD-10-CM

## 2022-03-14 DIAGNOSIS — D6481 Anemia due to antineoplastic chemotherapy: Secondary | ICD-10-CM

## 2022-03-14 DIAGNOSIS — T451X5A Adverse effect of antineoplastic and immunosuppressive drugs, initial encounter: Secondary | ICD-10-CM

## 2022-03-14 DIAGNOSIS — E876 Hypokalemia: Secondary | ICD-10-CM

## 2022-03-14 DIAGNOSIS — S0083XA Contusion of other part of head, initial encounter: Secondary | ICD-10-CM

## 2022-03-14 DIAGNOSIS — C3491 Malignant neoplasm of unspecified part of right bronchus or lung: Secondary | ICD-10-CM | POA: Diagnosis not present

## 2022-03-14 DIAGNOSIS — E871 Hypo-osmolality and hyponatremia: Secondary | ICD-10-CM

## 2022-03-14 LAB — BASIC METABOLIC PANEL
Anion gap: 11 (ref 5–15)
BUN: 14 mg/dL (ref 8–23)
CO2: 23 mmol/L (ref 22–32)
Calcium: 8.8 mg/dL — ABNORMAL LOW (ref 8.9–10.3)
Chloride: 91 mmol/L — ABNORMAL LOW (ref 98–111)
Creatinine, Ser: 1.68 mg/dL — ABNORMAL HIGH (ref 0.61–1.24)
GFR, Estimated: 45 mL/min — ABNORMAL LOW (ref >60)
Glucose, Bld: 115 mg/dL — ABNORMAL HIGH (ref 70–99)
Potassium: 3.5 mmol/L (ref 3.5–5.1)
Sodium: 125 mmol/L — ABNORMAL LOW (ref 135–145)

## 2022-03-14 LAB — CBC
HCT: 24.7 % — ABNORMAL LOW (ref 39.0–52.0)
Hemoglobin: 8.6 g/dL — ABNORMAL LOW (ref 13.0–17.0)
MCH: 32.7 pg (ref 26.0–34.0)
MCHC: 34.8 g/dL (ref 30.0–36.0)
MCV: 93.9 fL (ref 80.0–100.0)
Platelets: 479 10*3/uL — ABNORMAL HIGH (ref 150–400)
RBC: 2.63 MIL/uL — ABNORMAL LOW (ref 4.22–5.81)
RDW: 14 % (ref 11.5–15.5)
WBC: 8.5 10*3/uL (ref 4.0–10.5)
nRBC: 0 % (ref 0.0–0.2)

## 2022-03-14 LAB — IRON AND TIBC
Iron: 24 ug/dL — ABNORMAL LOW (ref 45–182)
Saturation Ratios: 15 % — ABNORMAL LOW (ref 17.9–39.5)
TIBC: 160 ug/dL — ABNORMAL LOW (ref 250–450)
UIBC: 136 ug/dL

## 2022-03-14 LAB — RETICULOCYTES
Immature Retic Fract: 12.6 % (ref 2.3–15.9)
RBC.: 2.6 MIL/uL — ABNORMAL LOW (ref 4.22–5.81)
Retic Count, Absolute: 46.5 10*3/uL (ref 19.0–186.0)
Retic Ct Pct: 1.8 % (ref 0.4–3.1)

## 2022-03-14 LAB — URINALYSIS, COMPLETE (UACMP) WITH MICROSCOPIC
Bacteria, UA: NONE SEEN
Bilirubin Urine: NEGATIVE
Glucose, UA: NEGATIVE mg/dL
Hgb urine dipstick: NEGATIVE
Ketones, ur: NEGATIVE mg/dL
Leukocytes,Ua: NEGATIVE
Nitrite: NEGATIVE
Protein, ur: NEGATIVE mg/dL
Specific Gravity, Urine: 1.005 (ref 1.005–1.030)
pH: 7 (ref 5.0–8.0)

## 2022-03-14 LAB — OSMOLALITY, URINE: Osmolality, Ur: 176 mOsm/kg — ABNORMAL LOW (ref 300–900)

## 2022-03-14 LAB — HEPATIC FUNCTION PANEL
ALT: 10 U/L (ref 0–44)
AST: 16 U/L (ref 15–41)
Albumin: 2.4 g/dL — ABNORMAL LOW (ref 3.5–5.0)
Alkaline Phosphatase: 103 U/L (ref 38–126)
Bilirubin, Direct: <0.1 mg/dL (ref 0.0–0.2)
Total Bilirubin: 0.6 mg/dL (ref 0.3–1.2)
Total Protein: 6.1 g/dL — ABNORMAL LOW (ref 6.5–8.1)

## 2022-03-14 LAB — SODIUM, URINE, RANDOM: Sodium, Ur: 32 mmol/L

## 2022-03-14 LAB — CBG MONITORING, ED: Glucose-Capillary: 107 mg/dL — ABNORMAL HIGH (ref 70–99)

## 2022-03-14 LAB — FOLATE: Folate: >40 ng/mL (ref >5.9)

## 2022-03-14 LAB — FERRITIN: Ferritin: 996 ng/mL — ABNORMAL HIGH (ref 24–336)

## 2022-03-14 LAB — D-DIMER, QUANTITATIVE: D-Dimer, Quant: 2.48 ug/mL-FEU — ABNORMAL HIGH (ref 0.00–0.50)

## 2022-03-14 LAB — OSMOLALITY: Osmolality: 255 mOsm/kg — ABNORMAL LOW (ref 275–295)

## 2022-03-14 LAB — HIV ANTIBODY (ROUTINE TESTING W REFLEX): HIV Screen 4th Generation wRfx: NONREACTIVE

## 2022-03-14 LAB — MAGNESIUM: Magnesium: 1.5 mg/dL — ABNORMAL LOW (ref 1.7–2.4)

## 2022-03-14 LAB — VITAMIN B12: Vitamin B-12: 823 pg/mL (ref 180–914)

## 2022-03-14 MED ORDER — SODIUM CHLORIDE 0.9 % IV SOLN
INTRAVENOUS | Status: DC
Start: 2022-03-14 — End: 2022-03-14

## 2022-03-14 MED ORDER — ROSUVASTATIN CALCIUM 5 MG PO TABS: 5.0000 mg | ORAL_TABLET | ORAL | Status: DC

## 2022-03-14 MED ORDER — MAGNESIUM OXIDE -MG SUPPLEMENT 400 (240 MG) MG PO TABS: 400.0000 mg | ORAL_TABLET | Freq: Three times a day (TID) | ORAL | Status: DC

## 2022-03-14 MED ORDER — AMLODIPINE BESYLATE 5 MG PO TABS: 5.0000 mg | ORAL_TABLET | Freq: Every day | ORAL | Status: DC

## 2022-03-14 MED ORDER — FLUTICASONE FUROATE-VILANTEROL 100-25 MCG/ACT IN AEPB: 1.0000 | INHALATION_SPRAY | Freq: Every day | RESPIRATORY_TRACT | Status: DC

## 2022-03-14 MED ORDER — HEPARIN SOD (PORK) LOCK FLUSH 100 UNIT/ML IV SOLN
500.0000 [IU] | INTRAVENOUS | Status: AC | PRN
  Administered 2022-03-14: 500 [IU]

## 2022-03-14 MED ORDER — ASPIRIN 81 MG PO TBEC: 81.0000 mg | DELAYED_RELEASE_TABLET | Freq: Every day | ORAL | Status: DC

## 2022-03-14 MED ORDER — METOPROLOL SUCCINATE ER 25 MG PO TB24: 25.0000 mg | ORAL_TABLET | Freq: Every day | ORAL | Status: DC

## 2022-03-14 MED ORDER — PANTOPRAZOLE SODIUM 40 MG PO TBEC: 80.0000 mg | DELAYED_RELEASE_TABLET | Freq: Every day | ORAL | Status: DC

## 2022-03-14 MED ORDER — ACETAMINOPHEN 500 MG PO TABS
1000.0000 mg | ORAL_TABLET | Freq: Four times a day (QID) | ORAL | Status: DC | PRN
Start: 2022-03-14 — End: 2022-03-14

## 2022-03-14 MED ORDER — UMECLIDINIUM BROMIDE 62.5 MCG/ACT IN AEPB: 1.0000 | INHALATION_SPRAY | Freq: Every day | RESPIRATORY_TRACT | Status: DC

## 2022-03-14 MED ORDER — CLOPIDOGREL BISULFATE 75 MG PO TABS: 75.0000 mg | ORAL_TABLET | Freq: Every day | ORAL | Status: DC

## 2022-03-14 MED ORDER — LORATADINE 10 MG PO TABS: 10.0000 mg | ORAL_TABLET | Freq: Every day | ORAL | Status: DC

## 2022-03-14 MED ORDER — ADULT MULTIVITAMIN W/MINERALS CH: 1.0000 | ORAL_TABLET | Freq: Every day | ORAL | Status: DC

## 2022-03-14 NOTE — ED Notes (Signed)
Pt was able to stand and use restroom while holding to rail, no dizziness noted

## 2022-03-14 NOTE — ED Notes (Signed)
Awaiting port-a-cath de-access to d/c pt to home.

## 2022-03-14 NOTE — Assessment & Plan Note (Signed)
Cont home BP meds No orthostatic hypotension on eval today when ED stood him up.

## 2022-03-14 NOTE — ED Triage Notes (Signed)
Pt BIB [facility] from home after fall in the hall. House [facility] not working, patient has hx of lung cancer. Cachectic in appearance. VS stable, blood noted to nose and forehead.

## 2022-03-14 NOTE — Assessment & Plan Note (Signed)
VERY chronic. In contrast to EDP I DONT think this is related to lung cancer, since this has been going on chronically since at least 2009 that I can see (possibly even before that).  He had 1 sodium of 136 in 2017, other than that, ALL sodiums have been low (some 20+ BMPs over the past decade+).  Slightly lower than baseline today at 123 but usually he runs 125-130 that I can see. 1. However, in interest of completeness given today's syncope: 1. Checking LFTs 2. Checking urine sodium, urine osm, and serum osm to r/o SIADH 2. Repeat BMP in AM

## 2022-03-14 NOTE — ED Notes (Signed)
Accompanied pt to ambulate to rest room approximately 50 feet from room. Pt tolerated ambulation well. Pt denies dizziness and reported feeling good.

## 2022-03-14 NOTE — Assessment & Plan Note (Signed)
Creat today appears chronic and his baseline since Nov 2022. Looks like it was getting worse last year when he was on chemo. ? Nephrotoxicity from the chemotherapy?

## 2022-03-14 NOTE — Discharge Summary (Signed)
Physician Discharge Summary  Sean Rose VEH:[id number] DOB: [birth year] DOA: 03/13/2022  PCP: [name], NP  Admit date: 03/13/2022 Discharge date: 03/14/2022  Time spent: 45 minutes  Recommendations for Outpatient Follow-up:  Will need outpatient continued management of hyponatremia as well as consideration for further chemo as per Dr. [name] Will need labs in about 1 week  Discharge Diagnoses:  MAIN problem for hospitalization   Syncope likely secondary to heat exhaustion in the setting of chronic anemia Moderate to severe hyponatremia  Please see below for itemized issues addressed in [facility]- refer to other progress notes for clarity if needed  Discharge Condition: Guarded  Diet recommendation: Regular  Filed Weights   03/13/22 1749  Weight: 48.1 kg    History of present illness:    34 white male known history of CABG BMS to RCA 2007 with 3 in-stent stenosis 2008, PVD with right iliac stent 2007 HTN HLD tobacco use with COPD Right lobectomy 08/2018  He presented to the emergency room as per my partner's note after syncopal event while passing urine He states that he normally has dizziness episodes every several days and is aware of this-because he was at home and they were repairing his HVAC he was trying to rush out of the bathroom after passing urine and did not take his time He felt it coming on as a prodrome Did not have any chest pain or funny heart rhythm that he felt  Initial lab work-up was significant for moderate to severe hyponatremia and this is chronic he tells me he has had this for a long time--- His hemoglobin was within the 8-9 range which is stable He was a little hypokalemic as well and this was replaced  He did not have any interest in staying in the hospital-he was able to ambulate around the emergency room and did not have any further syncopal episodes and was able to stand and passed urine without event His magnesium was replaced His  leukocytosis from admission seem to resolve on its own  I mention to him clearly that I felt that with hyponatremia low serum osmolality and low urine awesome that it is reasonable to keep him in the hospital make sure that his sodium comes up a bit and get an echo for completion purposes as he does have frequent episodes of this however and his telemetry was benign-we felt that it was in his best interest especially because he was adamant about going home to let him go home and follow-up in the outpatient setting  We made no changes to his medications-I did alert him clearly to the fact if he has further issues he should come back to the emergency room    Discharge Exam: Vitals:   03/14/22 0900 03/14/22 1009  BP: 137/86 136/84  Pulse: 74 76  Resp: 16   Temp:  98.3 F (36.8 C)  SpO2: 99% 99%    Subj on day of d/c   Awake coherent  General Exam on discharge  EOMI NCAT no focal deficit bruise to nose Finger-nose-finger testing normal Straight leg raise bilaterally even Abdomen soft no rebound no guarding Chest clear no added sound S1-S2 normal  Discharge Instructions   Discharge Instructions     Diet - low sodium heart healthy   Complete by: As directed    Discharge instructions   Complete by: As directed    You should walk slowly and carefully and when you feel dizzy/weak you should ensure that you take your time  and sit down or slow down We have not made any changes to your medications If you have recurrence of symptoms and you feel ill you can come back to the emergency room   Increase activity slowly   Complete by: As directed       Allergies as of 03/14/2022       Reactions   Compazine [prochlorperazine] Other (See Comments)   " made him high and could not sleep". He does not want to take it again.        Medication List     TAKE these medications    acetaminophen 500 MG tablet Commonly known as: TYLENOL Take 1,000 mg by mouth every 6 (six) hours as  needed for headache.   amLODipine 5 MG tablet Commonly known as: NORVASC Take 1 tablet (5 mg total) by mouth daily.   aspirin EC 81 MG tablet Take 81 mg by mouth daily.   clopidogrel 75 MG tablet Commonly known as: PLAVIX TAKE 1 TABLET BY MOUTH EVERY DAY   esomeprazole 20 MG capsule Commonly known as: NEXIUM Take 40 mg by mouth daily.   fexofenadine 60 MG tablet Commonly known as: ALLEGRA Take 60 mg by mouth daily.   folic acid 1 MG tablet Commonly known as: FOLVITE TAKE 1 TABLET(1 MG) BY MOUTH DAILY What changed: See the new instructions.   Magnesium 500 MG Tabs Take 1 tablet (500 mg total) by mouth 3 (three) times daily.   metoprolol succinate 25 MG 24 hr tablet Commonly known as: Toprol XL Take 1 tablet (25 mg total) by mouth daily.   multivitamin with minerals Tabs tablet Take 1 tablet by mouth daily.   rosuvastatin 5 MG tablet Commonly known as: CRESTOR TAKE 1 TABLET(5 MG) BY MOUTH EVERY OTHER DAY What changed: See the new instructions.   Trelegy Ellipta 100-62.5-25 MCG/ACT Aepb Generic drug: Fluticasone-Umeclidin-Vilant Inhale 1 puff into the lungs daily.       Allergies  Allergen Reactions   Compazine [Prochlorperazine] Other (See Comments)    " made him high and could not sleep". He does not want to take it again.      The results of significant diagnostics from this hospitalization (including imaging, microbiology, ancillary and laboratory) are listed below for reference.    Significant Diagnostic Studies: CT Head Wo Contrast  Result Date: 03/13/2022 CLINICAL DATA:  Recent fall with headaches and neck pain, initial encounter EXAM: CT HEAD WITHOUT CONTRAST CT MAXILLOFACIAL WITHOUT CONTRAST CT CERVICAL SPINE WITHOUT CONTRAST TECHNIQUE: Multidetector CT imaging of the head, cervical spine, and maxillofacial structures were performed using the standard protocol without intravenous contrast. Multiplanar CT image reconstructions of the cervical spine  and maxillofacial structures were also generated. RADIATION DOSE REDUCTION: This exam was performed according to the departmental dose-optimization program which includes automated exposure control, adjustment of the mA and/or kV according to patient size and/or use of iterative reconstruction technique. COMPARISON:  11/07/2021 FINDINGS: CT HEAD FINDINGS Brain: No evidence of acute infarction, hemorrhage, hydrocephalus, extra-axial collection or mass lesion/mass effect. Mild chronic atrophic and ischemic changes are noted. Vascular: No hyperdense vessel or unexpected calcification. Skull: Normal. Negative for fracture or focal lesion. Other: None. CT MAXILLOFACIAL FINDINGS Osseous: No acute fracture is identified. Orbits: Orbits and their contents are within normal limits. Sinuses: Paranasal sinuses are well aerated. Soft tissues: Soft tissue laceration is noted over the bridge of the nose although no underlying bony abnormality is noted. CT CERVICAL SPINE FINDINGS Alignment: Within normal limits. Skull base and vertebrae:  Cervical segments are well visualized. Mild retrolisthesis of C4 with respect to C3 and C5 is noted of a degenerative nature. No acute fracture or acute facet abnormality is noted. Mild osteophytic changes and facet hypertrophic changes are noted. The odontoid is within normal limits. Soft tissues and spinal canal: Surrounding soft tissue structures demonstrate diffuse vascular calcifications. No focal hematoma or adenopathy is noted. Upper chest: Visualized lung apices show scarring bilaterally as well as emphysematous changes. Other: None IMPRESSION: CT of the head: Chronic atrophic and ischemic changes without acute abnormality. CT of the maxillofacial bones: Soft tissue laceration over the nasal bridge although no underlying bony abnormality is noted. CT of the cervical spine: Degenerative change without acute abnormality. Electronically Signed   By: Inez Catalina M.D.   On: 03/13/2022 20:30    CT Cervical Spine Wo Contrast  Result Date: 03/13/2022 CLINICAL DATA:  Recent fall with headaches and neck pain, initial encounter EXAM: CT HEAD WITHOUT CONTRAST CT MAXILLOFACIAL WITHOUT CONTRAST CT CERVICAL SPINE WITHOUT CONTRAST TECHNIQUE: Multidetector CT imaging of the head, cervical spine, and maxillofacial structures were performed using the standard protocol without intravenous contrast. Multiplanar CT image reconstructions of the cervical spine and maxillofacial structures were also generated. RADIATION DOSE REDUCTION: This exam was performed according to the departmental dose-optimization program which includes automated exposure control, adjustment of the mA and/or kV according to patient size and/or use of iterative reconstruction technique. COMPARISON:  11/07/2021 FINDINGS: CT HEAD FINDINGS Brain: No evidence of acute infarction, hemorrhage, hydrocephalus, extra-axial collection or mass lesion/mass effect. Mild chronic atrophic and ischemic changes are noted. Vascular: No hyperdense vessel or unexpected calcification. Skull: Normal. Negative for fracture or focal lesion. Other: None. CT MAXILLOFACIAL FINDINGS Osseous: No acute fracture is identified. Orbits: Orbits and their contents are within normal limits. Sinuses: Paranasal sinuses are well aerated. Soft tissues: Soft tissue laceration is noted over the bridge of the nose although no underlying bony abnormality is noted. CT CERVICAL SPINE FINDINGS Alignment: Within normal limits. Skull base and vertebrae: Cervical segments are well visualized. Mild retrolisthesis of C4 with respect to C3 and C5 is noted of a degenerative nature. No acute fracture or acute facet abnormality is noted. Mild osteophytic changes and facet hypertrophic changes are noted. The odontoid is within normal limits. Soft tissues and spinal canal: Surrounding soft tissue structures demonstrate diffuse vascular calcifications. No focal hematoma or adenopathy is noted. Upper  chest: Visualized lung apices show scarring bilaterally as well as emphysematous changes. Other: None IMPRESSION: CT of the head: Chronic atrophic and ischemic changes without acute abnormality. CT of the maxillofacial bones: Soft tissue laceration over the nasal bridge although no underlying bony abnormality is noted. CT of the cervical spine: Degenerative change without acute abnormality. Electronically Signed   By: Inez Catalina M.D.   On: 03/13/2022 20:30   CT Maxillofacial Wo Contrast  Result Date: 03/13/2022 CLINICAL DATA:  Recent fall with headaches and neck pain, initial encounter EXAM: CT HEAD WITHOUT CONTRAST CT MAXILLOFACIAL WITHOUT CONTRAST CT CERVICAL SPINE WITHOUT CONTRAST TECHNIQUE: Multidetector CT imaging of the head, cervical spine, and maxillofacial structures were performed using the standard protocol without intravenous contrast. Multiplanar CT image reconstructions of the cervical spine and maxillofacial structures were also generated. RADIATION DOSE REDUCTION: This exam was performed according to the departmental dose-optimization program which includes automated exposure control, adjustment of the mA and/or kV according to patient size and/or use of iterative reconstruction technique. COMPARISON:  11/07/2021 FINDINGS: CT HEAD FINDINGS Brain: No evidence of acute infarction,  hemorrhage, hydrocephalus, extra-axial collection or mass lesion/mass effect. Mild chronic atrophic and ischemic changes are noted. Vascular: No hyperdense vessel or unexpected calcification. Skull: Normal. Negative for fracture or focal lesion. Other: None. CT MAXILLOFACIAL FINDINGS Osseous: No acute fracture is identified. Orbits: Orbits and their contents are within normal limits. Sinuses: Paranasal sinuses are well aerated. Soft tissues: Soft tissue laceration is noted over the bridge of the nose although no underlying bony abnormality is noted. CT CERVICAL SPINE FINDINGS Alignment: Within normal limits. Skull base  and vertebrae: Cervical segments are well visualized. Mild retrolisthesis of C4 with respect to C3 and C5 is noted of a degenerative nature. No acute fracture or acute facet abnormality is noted. Mild osteophytic changes and facet hypertrophic changes are noted. The odontoid is within normal limits. Soft tissues and spinal canal: Surrounding soft tissue structures demonstrate diffuse vascular calcifications. No focal hematoma or adenopathy is noted. Upper chest: Visualized lung apices show scarring bilaterally as well as emphysematous changes. Other: None IMPRESSION: CT of the head: Chronic atrophic and ischemic changes without acute abnormality. CT of the maxillofacial bones: Soft tissue laceration over the nasal bridge although no underlying bony abnormality is noted. CT of the cervical spine: Degenerative change without acute abnormality. Electronically Signed   By: Inez Catalina M.D.   On: 03/13/2022 20:30   DG Chest Port 1 View  Result Date: 03/13/2022 CLINICAL DATA:  Syncope; trauma; fall EXAM: PORTABLE CHEST 1 VIEW COMPARISON:  Radiographs 11/07/2021 FINDINGS: Right chest wall Port-A-Cath tip in the right atrium. Postoperative changes about the right upper lung and hilum. Stable cardiomediastinal silhouette. Aortic atherosclerotic calcification. Biapical pleuroparenchymal scarring. Increased small layering bilateral pleural effusions, greater on the right. Associated airspace opacities with associated atelectasis/consolidation. Coarse interstitial markings in both lungs with emphysematous change in the upper lobes. No pneumothorax. No acute osseous abnormality. Similar sclerosis about the left glenoid compatible with treated metastatic disease. IMPRESSION: Increased small right-greater-than-left pleural effusions with associated atelectasis/consolidation since radiographs 11/07/2021. Biapical scarring and emphysematous change. Electronically Signed   By: Placido Sou M.D.   On: 03/13/2022 18:11     Microbiology: No results found for this or any previous visit (from the past 240 hour(s)).   Labs: Basic Metabolic Panel: Recent Labs  Lab 03/13/22 1755 03/14/22 0042 03/14/22 0303  NA 123*  --  125*  K 3.1*  --  3.5  CL 89*  --  91*  CO2 23  --  23  GLUCOSE 97  --  115*  BUN 16  --  14  CREATININE 1.73*  --  1.68*  CALCIUM 8.6*  --  8.8*  MG  --  1.5*  --    Liver Function Tests: Recent Labs  Lab 03/14/22 0042  AST 16  ALT 10  ALKPHOS 103  BILITOT 0.6  PROT 6.1*  ALBUMIN 2.4*   No results for input(s): "LIPASE", "AMYLASE" in the last 168 hours. No results for input(s): "AMMONIA" in the last 168 hours. CBC: Recent Labs  Lab 03/13/22 1755 03/14/22 0303  WBC 11.0* 8.5  NEUTROABS 7.4  --   HGB 9.6* 8.6*  HCT 26.4* 24.7*  MCV 91.7 93.9  PLT 534* 479*   Cardiac Enzymes: No results for input(s): "CKTOTAL", "CKMB", "CKMBINDEX", "TROPONINI" in the last 168 hours. BNP: BNP (last 3 results) No results for input(s): "BNP" in the last 8760 hours.  ProBNP (last 3 results) No results for input(s): "PROBNP" in the last 8760 hours.  CBG: Recent Labs  Lab 03/14/22 0740  GLUCAP 107*       Signed:  Nita Sells MD   [facility] 03/14/2022, 10:15 AM

## 2022-03-14 NOTE — ED Notes (Signed)
Discussed with pt dr. Nita Sells pt clinical status and walking. Reviewed all d/c instructions with pt and pt verbalizes understanding. Pt denies any questions at time of d/c. Pt wife in parking lot to take pt home.

## 2022-03-14 NOTE — Assessment & Plan Note (Addendum)
With mets to bone  Not on chemo due to side effects.  No Dz progression as of CT in June, next CT currently planned for 4 months (~Oct or Nov).  Consider repeat CT during this admit if remainder of work up neg for syncope.

## 2022-03-14 NOTE — Assessment & Plan Note (Addendum)
1. Possibly due to house being 80 degrees today? 2. Syncope pathway 3. Tele monitor 4. 2d echo 5. See adenocarcinoma below 6. May want to consider CTA chest to r/o PE if remainder of work up is completely negative, but lower suspicion at the moment (no tachycardia, leg swelling, CP, SOB, etc). 1. Will check D.Dimer

## 2022-03-14 NOTE — ED Notes (Signed)
Pt tolerated standing without difficulty or c/o dizziness.

## 2022-03-14 NOTE — Assessment & Plan Note (Signed)
Cont home nebs

## 2022-03-14 NOTE — ED Notes (Signed)
Pt assisted to pt waiting room via wheelchair. Pt called wife for ride home. Security aware.

## 2022-03-14 NOTE — ED Notes (Signed)
Pt stating that he really needs to empty bladder, pt unable to urinate a lot in urinal, pt does not want to be bladder scanned at this time,

## 2022-03-14 NOTE — Assessment & Plan Note (Signed)
Replace K Check Mg Repeat BMP in AM

## 2022-03-14 NOTE — H&P (Signed)
History and Physical    Patient: Sean Rose VXB:[id number] DOB: 1956/01/22 DOA: 03/13/2022 DOS: the patient was seen and examined on 03/14/2022 PCP: [name], NP  Patient coming from: Home  Chief Complaint:  Chief Complaint  Patient presents with   Trauma    Lvl 2   Fall   HPI: JOHNEL YIELDING is a 66 y.o. male with medical history significant of NSCLC stage 4, no longer on treatment due to side effects of chemo.  With that said however, most recent CT in June showed no dz progression.  Also HTN, COPD, chronic hyponatremia, CAD.  Pt in to ED today following syncopal episode at home.  Patient reports that his air conditioner was broken today and they had it turned off at 7 AM this morning.  His house was very warm he reported greater than 80 degrees and he had not been feeling well today.  He had to go to the bathroom and got up and use the restroom but reports on his way back from the bathroom he started feeling very lightheaded and faint.  He thought he was going to be able to make it but did not and fell hitting his face on the floor which he does remember but is not sure if he hit something on his way down.  Family did report a brief loss of consciousness but he was awake and lucid when EMS arrived.  They noted his blood pressure on arrival was normal.  No CP, abd pain, extremity pain.  Has face pain.  He does report that he lost a bunch of weight with the cancer and he does feel lightheaded often when he gets up but does not usually lose consciousness.  Review of Systems: As mentioned in the history of present illness. All other systems reviewed and are negative. Past Medical History:  Diagnosis Date   Allergy    Anemia    Anginal pain ([place])    Blood transfusion without reported diagnosis    CAD (coronary artery disease)    CAP (community acquired pneumonia) 09/2016   COPD (chronic obstructive pulmonary disease) (HCC)    Emphysema of lung (HCC)    GERD  (gastroesophageal reflux disease)    Heart murmur    "I was told I had one when I was a kid"   Hemorrhoids    History of anal fissures    "no surgeries" (10/30/2016)   Hyperlipidemia    Hypertension    lung ca dx'd 08/2018   with mets to bones in [place]   Myocardial infarction [facility])    Peripheral arterial disease ([place])    status post right common iliac artery stenting back in 2007   Seasonal allergies    Tobacco abuse    Past Surgical History:  Procedure Laterality Date   ANKLE SURGERY Left    "rebuilt it"   ANTERIOR CRUCIATE LIGAMENT REPAIR Right    CARDIAC CATHETERIZATION  09/23/2008   Continued medical therapy - may need GI evaluation in addition.   CARDIAC CATHETERIZATION  10/28/2007   Medical therapy recommended.   CARDIAC CATHETERIZATION  11/18/2006   In-stent restenosis RCA  (50% distal edge, 80% segmental mid, and 50-60% segmental proximal). Successful cutting balloon atherectomy using a [id number] cutting balloon. 3 inflations with atherectomy performed on mid and proximal portions resulting in reduction of 80% mid in-stent restenosis to less than 20% residual and 50-60% segmental proximal to less than 20% residual without dissection.   CARDIAC CATHETERIZATION  02/26/2006  Severe stenosis in RCA. Stenting performed using IVUS. 3.5x20 Maverick balloon deployed at [employer]. Distal stent-a 4x28 Liberte stent-deployed 12atm 48sec, 12atm 31sec, 4atm 19sec. Mid stent-a 4x28 Liberte stent-deployed 14atm 45sec, 14atm 60sec, 14atm 44sec. Proximal stent-4x8 Liberte- 14atm 45sec,14atm 47sec, 16atm 43sec. Severely diseased segment then appeared TIMI-3 flow.   CARDIOVASCULAR STRESS TEST  11/17/2012   No significant ECG changes. Septal perfusion defect is new when complared to study from 2010. Abnormal myocardial perfusion imaging with a basal to mid perfusion suggestive of previous MI.   CAROTID DOPPLER  08/09/2011   Bilateral Bulb/Proximal ICA - demonstrated a mild amount of fibrous plaque without  evidence of significant diameter reduction reduction or other vascular abnormality.   CHEST TUBE INSERTION Right 09/02/2018   Procedure: Chest Tube Insertion;  Surgeon: Garner Nash, DO;  Location: [place];  Service: Thoracic;  Laterality: Right;   COLONOSCOPY     2003, 2014   CORONARY ANGIOPLASTY WITH STENT PLACEMENT     FEMORAL ARTERY STENT     INGUINAL HERNIA REPAIR Right    IR IMAGING GUIDED PORT INSERTION  11/13/2020   KNEE ARTHROSCOPY Right "multiple"   LAPAROSCOPIC APPENDECTOMY N/A 12/07/2018   Procedure: APPENDECTOMY LAPAROSCOPIC;  Surgeon: Coralie Keens, MD;  Location: [facility];  Service: General;  Laterality: N/A;   LOWER EXTREMITY ARTERIAL DOPPLER  01/31/2011   Bilateral ABIs-normal values with no suggestion of arterial insuff to the lower extremities at rest. Right CIA stent-mild amount of nonhemodynamically significant plaque is noted throughout   [place]   "bone-eating tumor"   PERCUTANEOUS STENT INTERVENTION  04/04/2006 & 04/13/2015   a. Right common iliac artery with an 8.0x18 mm Herculink stent deployed at 12 atm. Stenosis was reduced from 80% to 0% with brisk flow. b. I-cast stenting to left common iliac artery   PERIPHERAL VASCULAR CATHETERIZATION N/A 04/13/2015   Procedure: Lower Extremity Angiography;  Surgeon: Lorretta Harp, MD; L-oCIA 75%, 40-50% L-EIA, R-CIA stent patent, s/p 8 mm x 38 mm ICast covered stent>>0% stenosis in [place] Right    TRANSTHORACIC ECHOCARDIOGRAM  11/26/2012   EF not noted. Aortic valve-sclerosis without stenosis, no regurgiation.    UPPER GASTROINTESTINAL ENDOSCOPY     US CAROTID DOPPLER BILATERAL ([place] [facility])  08/09/2011   Bilateral Bulb/Proximal ICAa demonstrated a mild amount of fibrous plaque without evidence of significant diameter reduction or any other vascular abnormality.   VIDEO ASSISTED THORACOSCOPY (VATS)/ LOBECTOMY Right 09/14/2018   Procedure: RIGHT VIDEO ASSISTED  THORACOSCOPY (VATS)/ RIGHT UPPER LOBECTOMY;  Surgeon: Melrose Nakayama, MD;  Location: [place];  Service: Thoracic;  Laterality: Right;   VIDEO BRONCHOSCOPY WITH ENDOBRONCHIAL NAVIGATION N/A 09/02/2018   Procedure: VIDEO BRONCHOSCOPY WITH ENDOBRONCHIAL NAVIGATION;  Surgeon: Garner Nash, DO;  Location: [facility];  Service: Thoracic;  Laterality: N/A;   Social History:  reports that he quit smoking about 3 years ago. His smoking use included cigarettes. He has a 9.50 pack-year smoking history. He has never used smokeless tobacco. He reports that he does not currently use alcohol. He reports that he does not use drugs.  Allergies  Allergen Reactions   Compazine [Prochlorperazine] Other (See Comments)    " made him high and could not sleep". He does not want to take it again.    Family History  Problem Relation Age of Onset   Colon cancer Mother    Heart disease Father    Heart disease Paternal  Grandfather    Rectal cancer Other    Esophageal cancer Neg Hx    Stomach cancer Neg Hx     Prior to Admission medications   Medication Sig Start Date End Date Taking? Authorizing Provider  acetaminophen (TYLENOL) 500 MG tablet Take 1,000 mg by mouth every 6 (six) hours as needed for headache.   Yes [provider]  amLODipine (NORVASC) 5 MG tablet Take 1 tablet (5 mg total) by mouth daily. 04/06/21 03/13/22 Yes Warren Lacy, PA-C  aspirin EC 81 MG tablet Take 81 mg by mouth daily.   Yes [provider]  clopidogrel (PLAVIX) 75 MG tablet TAKE 1 TABLET BY MOUTH EVERY DAY Patient taking differently: Take 75 mg by mouth daily. 09/24/21  Yes Lorretta Harp, MD  esomeprazole (NEXIUM) 20 MG capsule Take 40 mg by mouth daily.   Yes [provider]  fexofenadine (ALLEGRA) 60 MG tablet Take 60 mg by mouth daily.   Yes [provider]  Fluticasone-Umeclidin-Vilant (TRELEGY ELLIPTA) 100-62.5-25 MCG/ACT AEPB Inhale 1 puff into the lungs daily. 02/25/22  Yes Icard,  Octavio Graves, DO  folic acid (FOLVITE) 1 MG tablet TAKE 1 TABLET(1 MG) BY MOUTH DAILY Patient taking differently: Take 1 mg by mouth daily. 01/21/22  Yes Heilingoetter, Cassandra L, PA-C  Magnesium 500 MG TABS Take 1 tablet (500 mg total) by mouth 3 (three) times daily. 03/02/21  Yes Nita Sells, MD  metoprolol succinate (TOPROL XL) 25 MG 24 hr tablet Take 1 tablet (25 mg total) by mouth daily. 04/06/21  Yes Warren Lacy, PA-C  Multiple Vitamin (MULTIVITAMIN WITH MINERALS) TABS tablet Take 1 tablet by mouth daily.   Yes [provider]  rosuvastatin (CRESTOR) 5 MG tablet TAKE 1 TABLET(5 MG) BY MOUTH EVERY OTHER DAY Patient taking differently: Take 5 mg by mouth every other day. 07/02/21  Yes Lorretta Harp, MD    Physical Exam: Vitals:   03/13/22 2100 03/13/22 2130 03/13/22 2215 03/13/22 2345  BP: 120/78 111/76 122/80 136/76  Pulse: 76 74 71 67  Resp: _0 Temp:      TempSrc:      SpO2: 97% 98% 99% 95%  Weight:      Height:       Constitutional: NAD, calm, comfortable Eyes: PERRL, lids and conjunctivae normal ENMT: Mucous membranes are moist. Posterior pharynx clear of any exudate or lesions.Normal dentition. Abrasion to forehead and laceration to nose. Neck: normal, supple, no masses, no thyromegaly Respiratory: clear to auscultation bilaterally, no wheezing, no crackles. Normal respiratory effort. No accessory muscle use.  Cardiovascular: Regular rate and rhythm, no murmurs / rubs / gallops. No extremity edema. 2+ pedal pulses. No carotid bruits.  Abdomen: no tenderness, no masses palpated. No hepatosplenomegaly. Bowel sounds positive.  Musculoskeletal: no clubbing / cyanosis. No joint deformity upper and lower extremities. Good ROM, no contractures. Normal muscle tone.  Skin: no rashes, lesions, ulcers. No induration Neurologic: CN 2-12 grossly intact. Sensation intact, DTR normal. Strength 5/5 in all 4.  Psychiatric: Normal judgment and insight. Alert  and oriented x 3. Normal mood.   Data Reviewed:       Latest Ref Rng & Units 03/13/2022    5:55 PM 01/25/2022   10:06 AM 11/07/2021    2:15 PM  CBC  WBC 4.0 - 10.5 K/uL 11.0  10.1  10.0   Hemoglobin 13.0 - 17.0 g/dL 9.6  9.2  9.7   Hematocrit 39.0 - 52.0 % 26.4  26.0  28.6   Platelets 150 - 400 K/uL 534  523  470       Latest Ref Rng & Units 03/13/2022    5:55 PM 01/25/2022   10:06 AM 11/07/2021    2:15 PM  BMP  Glucose 70 - 99 mg/dL 97  87  137   BUN 8 - 23 mg/dL _0 Creatinine 0.61 - 1.24 mg/dL 1.73  1.62  1.87   Sodium 135 - 145 mmol/L 123  125  128   Potassium 3.5 - 5.1 mmol/L 3.1  3.7  3.8   Chloride 98 - 111 mmol/L 89  91  95   CO2 22 - 32 mmol/L _1 Calcium 8.9 - 10.3 mg/dL 8.6  9.0  9.0      Assessment and Plan: * Syncope Possibly due to house being 80 degrees today? Syncope pathway Tele monitor 2d echo See adenocarcinoma below May want to consider CTA chest to r/o PE if remainder of work up is completely negative, but lower suspicion at the moment (no tachycardia, leg swelling, CP, SOB, etc). Will check D.Dimer  Adenocarcinoma of right lung, stage 4 (HCC) With mets to bone  Not on chemo due to side effects.  No Dz progression as of CT in June, next CT currently planned for 4 months (~Oct or Nov).  Consider repeat CT during this admit if remainder of work up neg for syncope.  Hyponatremia VERY chronic. In contrast to EDP I DONT think this is related to lung cancer, since this has been going on chronically since at least 2009 that I can see (possibly even before that).  He had 1 sodium of 136 in 2017, other than that, ALL sodiums have been low (some 20+ BMPs over the past decade+).  Slightly lower than baseline today at 123 but usually he runs 125-130 that I can see. However, in interest of completeness given today's syncope: Checking LFTs Checking urine sodium, urine osm, and serum osm to r/o SIADH Repeat BMP in AM  CKD (chronic kidney  disease) stage 3, GFR 30-59 ml/min (HCC) Creat today appears chronic and his baseline since Nov 2022. Looks like it was getting worse last year when he was on chemo. ? Nephrotoxicity from the chemotherapy?  Antineoplastic chemotherapy induced anemia Chronic, HGB today of 9.6 is essentially baseline for past 5+ months now.  Looks like last time he was needing transfusion (<7) was Jan.  Dont think this is responsible for syncope.  Stage 1 mild COPD by GOLD classification (HCC) Cont home nebs  Hypokalemia Replace K Check Mg Repeat BMP in AM  Essential hypertension Cont home BP meds No orthostatic hypotension on eval today when ED stood him up.      Advance Care Planning:   Code Status: Full Code  Consults: None  Family Communication: Wife at bedside  Severity of Illness: The appropriate patient status for this patient is OBSERVATION. Observation status is judged to be reasonable and necessary in order to provide the required intensity of service to ensure the patient's safety. The patient's presenting symptoms, physical exam findings, and initial radiographic and laboratory data in the context of their medical condition is felt to place them at decreased risk for further clinical deterioration. Furthermore, it is anticipated that the patient will be medically stable for discharge from the hospital within 2 midnights of admission.   Author: Etta Quill., DO 03/14/2022 12:10 AM  For on call review [email or web address].

## 2022-03-14 NOTE — Assessment & Plan Note (Signed)
Chronic, HGB today of 9.6 is essentially baseline for past 5+ months now.  Looks like last time he was needing transfusion (<7) was Jan.  Dont think this is responsible for syncope.

## 2022-03-18 ENCOUNTER — Telehealth: Payer: Self-pay

## 2022-03-18 NOTE — Telephone Encounter (Signed)
Opened chart in error.

## 2022-03-18 NOTE — Telephone Encounter (Signed)
Pt LVM requesting a return call. He further stated he thinks he has a pleural effusion causing him pain.  I have called the pt back to clarify. Pt states he has pain in his "upper left chest about 3 inches below clavicle in the soft area and feels weaker and wobblier". Pain is 8-9/10 and began either Thursday evening or Friday morning.   States lately he has been running temps of 99.3-100.9 and sleeping a lot since then and would like a rx of pain medication.

## 2022-03-19 ENCOUNTER — Inpatient Hospital Stay: Payer: BC Managed Care – PPO | Attending: Internal Medicine | Admitting: Internal Medicine

## 2022-03-19 NOTE — Telephone Encounter (Signed)
I have LVM for pt advising Dr. [name] has a 2:00pm appt today and has been scheduled at that time to discuss his concerns. I have also advised in my message that if pt no longer wishes to see Dr. [name], he can call back to cancel the appt.

## 2022-03-22 ENCOUNTER — Other Ambulatory Visit: Payer: Self-pay | Admitting: Cardiovascular Disease

## 2022-03-22 ENCOUNTER — Other Ambulatory Visit: Payer: Self-pay | Admitting: Physician Assistant

## 2022-03-27 ENCOUNTER — Encounter: Payer: Self-pay | Admitting: Adult Health

## 2022-03-27 ENCOUNTER — Ambulatory Visit (INDEPENDENT_AMBULATORY_CARE_PROVIDER_SITE_OTHER): Payer: BC Managed Care – PPO | Admitting: Adult Health

## 2022-03-27 VITALS — BP 120/82 | HR 88 | Temp 97.9°F | Ht 67.5 in | Wt 109.0 lb

## 2022-03-27 DIAGNOSIS — E43 Unspecified severe protein-calorie malnutrition: Secondary | ICD-10-CM

## 2022-03-27 DIAGNOSIS — R2681 Unsteadiness on feet: Secondary | ICD-10-CM | POA: Diagnosis not present

## 2022-03-27 DIAGNOSIS — G894 Chronic pain syndrome: Secondary | ICD-10-CM | POA: Diagnosis not present

## 2022-03-27 DIAGNOSIS — C3491 Malignant neoplasm of unspecified part of right bronchus or lung: Secondary | ICD-10-CM | POA: Diagnosis not present

## 2022-03-27 MED ORDER — MIRTAZAPINE 15 MG PO TABS: 15.0000 mg | ORAL_TABLET | Freq: Every day | ORAL | 0 refills | Status: DC

## 2022-03-27 MED ORDER — TRAMADOL HCL 50 MG PO TABS: 50.0000 mg | ORAL_TABLET | Freq: Two times a day (BID) | ORAL | 0 refills | Status: AC | PRN

## 2022-03-27 NOTE — Progress Notes (Signed)
Subjective:    Patient ID: Sean Rose, male    DOB: 10/18/1955, 66 y.o.   MRN: [id number]  HPI 66 year old male who  has a past medical history of Allergy, Anemia, Anginal pain ([place]), Blood transfusion without reported diagnosis, CAD (coronary artery disease), CAP (community acquired pneumonia) (09/2016), COPD (chronic obstructive pulmonary disease) ([place]), Emphysema of lung ([place]), GERD (gastroesophageal reflux disease), Heart murmur, Hemorrhoids, History of anal fissures, Hyperlipidemia, Hypertension, lung ca (dx'd 08/2018), Myocardial infarction [facility]), Peripheral arterial disease ([place]), Seasonal allergies, and Tobacco abuse.  He has not been seen in the last 4 years at this office as he has been going through treatment for non-small cell lung cancer with mets to the scapula and left supraclavicular lymph nodes.  He has been under active surveillance since December 2022.  Today he presents for generalized fatigue, weakness, protein allergy malnutrition, cervical spine pain.  His weight is down to 109 pounds, reports that he has decreased appetite as nothing tastes good to him.  He finds it difficult to eat since he does not have an appetite and he continues to lose weight.    Has also had a few falls over the last couple of months due to gait instability due to generalized weakness from muscle atrophy.  Finds it difficult to walk even a short distance or take a shower due to weakness.  Is interested in physical therapy to help him get stronger.  He also reports worsening cervical spine pain happens intermittently this has been an ongoing issue.  Due to decreased kidney function and the use of Plavix he is unable to take anti-inflammatory medication so he has been using Tylenol at home which does not touch the discomfort.  Review of Systems See HPI   Past Medical History:  Diagnosis Date   Allergy    Anemia    Anginal pain ([place])    Blood transfusion without reported diagnosis    CAD  (coronary artery disease)    CAP (community acquired pneumonia) 09/2016   COPD (chronic obstructive pulmonary disease) ([facility])    Emphysema of lung ([facility])    GERD (gastroesophageal reflux disease)    Heart murmur    "I was told I had one when I was a kid"   Hemorrhoids    History of anal fissures    "no surgeries" (10/30/2016)   Hyperlipidemia    Hypertension    lung ca dx'd 08/2018   with mets to bones in arms   Myocardial infarction [facility])    Peripheral arterial disease ([place])    status post right common iliac artery stenting back in 2007   Seasonal allergies    Tobacco abuse     Social History   Socioeconomic History   Marital status: Married    Spouse name: Not on file   Number of children: 2   Years of education: Not on file   Highest education level: Not on file  Occupational History   Occupation: retired  Tobacco Use   Smoking status: Former    Packs/day: 0.25    Years: 38.00    Total pack years: 9.50    Types: Cigarettes    Quit date: 10/19/2018    Years since quitting: 3.4   Smokeless tobacco: Never   Tobacco comments:    pack per day 12.17.19  Vaping Use   Vaping Use: Former  Substance and Sexual Activity   Alcohol use: Not Currently    Comment: socially   Drug use: No  Sexual activity: Yes  Other Topics Concern   Not on file  Social History Narrative   Patient is married with 2 children   Former smoker no tobacco now no drug use occasional/social alcohol   Social Determinants of Radio broadcast assistant Strain: Not on file  Food Insecurity: Not on file  Transportation Needs: Not on file  Physical Activity: Not on file  Stress: Not on file  Social Connections: Not on file  Intimate Partner Violence: Not on file    Past Surgical History:  Procedure Laterality Date   ANKLE SURGERY Left    "rebuilt it"   ANTERIOR CRUCIATE LIGAMENT REPAIR Right    CARDIAC CATHETERIZATION  09/23/2008   Continued medical therapy - may need GI evaluation in  addition.   CARDIAC CATHETERIZATION  10/28/2007   Medical therapy recommended.   CARDIAC CATHETERIZATION  11/18/2006   In-stent restenosis RCA  (50% distal edge, 80% segmental mid, and 50-60% segmental proximal). Successful cutting balloon atherectomy using a [id number] cutting balloon. 3 inflations with atherectomy performed on mid and proximal portions resulting in reduction of 80% mid in-stent restenosis to less than 20% residual and 50-60% segmental proximal to less than 20% residual without dissection.   CARDIAC CATHETERIZATION  02/26/2006   Severe stenosis in RCA. Stenting performed using IVUS. 3.5x20 Maverick balloon deployed at [employer]. Distal stent-a 4x28 Liberte stent-deployed 12atm 48sec, 12atm 31sec, 4atm 19sec. Mid stent-a 4x28 Liberte stent-deployed 14atm 45sec, 14atm 60sec, 14atm 44sec. Proximal stent-4x8 Liberte- 14atm 45sec,14atm 47sec, 16atm 43sec. Severely diseased segment then appeared TIMI-3 flow.   CARDIOVASCULAR STRESS TEST  11/17/2012   No significant ECG changes. Septal perfusion defect is new when complared to study from 2010. Abnormal myocardial perfusion imaging with a basal to mid perfusion suggestive of previous MI.   CAROTID DOPPLER  08/09/2011   Bilateral Bulb/Proximal ICA - demonstrated a mild amount of fibrous plaque without evidence of significant diameter reduction reduction or other vascular abnormality.   CHEST TUBE INSERTION Right 09/02/2018   Procedure: Chest Tube Insertion;  Surgeon: Garner Nash, DO;  Location: [facility];  Service: Thoracic;  Laterality: Right;   COLONOSCOPY     2003, 2014   CORONARY ANGIOPLASTY WITH STENT PLACEMENT     FEMORAL ARTERY STENT     INGUINAL HERNIA REPAIR Right    IR IMAGING GUIDED PORT INSERTION  11/13/2020   KNEE ARTHROSCOPY Right "multiple"   LAPAROSCOPIC APPENDECTOMY N/A 12/07/2018   Procedure: APPENDECTOMY LAPAROSCOPIC;  Surgeon: Coralie Keens, MD;  Location: [facility];  Service: General;  Laterality: N/A;   LOWER EXTREMITY  ARTERIAL DOPPLER  01/31/2011   Bilateral ABIs-normal values with no suggestion of arterial insuff to the lower extremities at rest. Right CIA stent-mild amount of nonhemodynamically significant plaque is noted throughout   [place]   "bone-eating tumor"   PERCUTANEOUS STENT INTERVENTION  04/04/2006 & 04/13/2015   a. Right common iliac artery with an 8.0x18 mm Herculink stent deployed at 12 atm. Stenosis was reduced from 80% to 0% with brisk flow. b. I-cast stenting to left common iliac artery   PERIPHERAL VASCULAR CATHETERIZATION N/A 04/13/2015   Procedure: Lower Extremity Angiography;  Surgeon: Lorretta Harp, MD; L-oCIA 75%, 40-50% L-EIA, R-CIA stent patent, s/p 8 mm x 38 mm ICast covered stent>>0% stenosis in [place] Right    TRANSTHORACIC ECHOCARDIOGRAM  11/26/2012   EF not noted. Aortic valve-sclerosis without stenosis, no regurgiation.  UPPER GASTROINTESTINAL ENDOSCOPY     US CAROTID DOPPLER BILATERAL ([place] [facility])  08/09/2011   Bilateral Bulb/Proximal ICAa demonstrated a mild amount of fibrous plaque without evidence of significant diameter reduction or any other vascular abnormality.   VIDEO ASSISTED THORACOSCOPY (VATS)/ LOBECTOMY Right 09/14/2018   Procedure: RIGHT VIDEO ASSISTED THORACOSCOPY (VATS)/ RIGHT UPPER LOBECTOMY;  Surgeon: Melrose Nakayama, MD;  Location: [facility];  Service: Thoracic;  Laterality: Right;   VIDEO BRONCHOSCOPY WITH ENDOBRONCHIAL NAVIGATION N/A 09/02/2018   Procedure: VIDEO BRONCHOSCOPY WITH ENDOBRONCHIAL NAVIGATION;  Surgeon: Garner Nash, DO;  Location: [facility];  Service: Thoracic;  Laterality: N/A;    Family History  Problem Relation Age of Onset   Colon cancer Mother    Heart disease Father    Heart disease Paternal Grandfather    Rectal cancer Other    Esophageal cancer Neg Hx    Stomach cancer Neg Hx     Allergies  Allergen Reactions   Compazine [Prochlorperazine] Other (See Comments)    "  made him high and could not sleep". He does not want to take it again.    Current Outpatient Medications on File Prior to Visit  Medication Sig Dispense Refill   acetaminophen (TYLENOL) 500 MG tablet Take 1,000 mg by mouth every 6 (six) hours as needed for headache.     amLODipine (NORVASC) 5 MG tablet Take 1 tablet (5 mg total) by mouth daily. 180 tablet 3   aspirin EC 81 MG tablet Take 81 mg by mouth daily.     clopidogrel (PLAVIX) 75 MG tablet TAKE 1 TABLET BY MOUTH EVERY DAY (Patient taking differently: Take 75 mg by mouth daily.) 90 tablet 3   esomeprazole (NEXIUM) 20 MG capsule Take 40 mg by mouth daily.     fexofenadine (ALLEGRA) 60 MG tablet Take 60 mg by mouth daily.     Fluticasone-Umeclidin-Vilant (TRELEGY ELLIPTA) 100-62.5-25 MCG/ACT AEPB Inhale 1 puff into the lungs daily. 60 each 3   folic acid (FOLVITE) 1 MG tablet TAKE 1 TABLET(1 MG) BY MOUTH DAILY (Patient taking differently: Take 1 mg by mouth daily.) 30 tablet 4   Magnesium 500 MG TABS Take 1 tablet (500 mg total) by mouth 3 (three) times daily. 8 tablet 0   metoprolol succinate (TOPROL-XL) 25 MG 24 hr tablet Take 1 tablet (25 mg total) by mouth daily. SCHEDULE OFFICE VISIT FOR FUTURE REFILLS. 30 tablet 0   Multiple Vitamin (MULTIVITAMIN WITH MINERALS) TABS tablet Take 1 tablet by mouth daily.     rosuvastatin (CRESTOR) 5 MG tablet TAKE 1 TABLET(5 MG) BY MOUTH EVERY OTHER DAY (Patient taking differently: Take 5 mg by mouth every other day.) 45 tablet 3   Current Facility-Administered Medications on File Prior to Visit  Medication Dose Route Frequency Provider Last Rate Last Admin   diphenhydrAMINE (BENADRYL) 25 mg capsule             BP 120/82   Pulse 88   Temp 97.9 F (36.6 C) (Oral)   Ht 5' 7.5" (1.715 m)   Wt 109 lb (49.4 kg)   SpO2 94%   BMI 16.82 kg/m       Objective:   Physical Exam Vitals and nursing note reviewed.  Constitutional:      Appearance: Normal appearance. He is cachectic. He is  ill-appearing (chronically).  Musculoskeletal:        General: Normal range of motion.  Skin:    General: Skin is warm and dry.  Neurological:  General: No focal deficit present.     Mental Status: He is alert and oriented to person, place, and time. Mental status is at baseline.     Motor: Weakness present.     Gait: Gait abnormal (in wheelchair).  Psychiatric:        Mood and Affect: Mood normal.        Behavior: Behavior normal.        Thought Content: Thought content normal.       Assessment & Plan:  1. Severe protein-calorie malnutrition ([place]) - will start on Remeron 15 mg QHS to help with increasing appetite - Follow up in 1 month or sooner if needed  - mirtazapine (REMERON) 15 MG tablet; Take 1 tablet (15 mg total) by mouth at bedtime.  Dispense: 90 tablet; Refill: 0 - Ambulatory referral to Physical Therapy - Ambulatory referral to Occupational Therapy  2. Chronic pain syndrome  - traMADol (ULTRAM) 50 MG tablet; Take 1 tablet (50 mg total) by mouth 2 (two) times daily as needed.  Dispense: 60 tablet; Refill: 0  3. Adenocarcinoma of right lung, stage 3 ([place]) - Per oncology   4. Gait instability - He does not want to do home health. He would benefit from both PT and OT.  - Ambulatory referral to Physical Therapy - Ambulatory referral to Occupational Therapy  [name], NP   Time spent with patient today was 45 minutes which consisted of chart review, discussing  unintentional weight loss, lung cancer, gait instability work up, treatment answering questions and documentation.

## 2022-03-27 NOTE — Patient Instructions (Addendum)
I am going to add Remeron to help with appetite  I am going to give you a short course of Tramadol    Someone from PT/OT will call you to schedule   Please follow up with me in one month or sooner if needed

## 2022-03-28 ENCOUNTER — Telehealth: Payer: Self-pay | Admitting: Adult Health

## 2022-03-28 NOTE — Telephone Encounter (Signed)
Pt had a PT scheduled with [facility] but they stated that they do not offer at home PT. Pt stated it will be easier on his wife so that she does have to deal with wheelchair and helping him up.

## 2022-03-28 NOTE — Telephone Encounter (Addendum)
Okay for referral?

## 2022-03-28 NOTE — Telephone Encounter (Signed)
Pt's spouse called to ask if it was possible to have the OT/PT take place at home?   Please advise.

## 2022-03-28 NOTE — Telephone Encounter (Signed)
Pt stated that he apologize he didn't think about his wife before making the decision. Pt wants the order placed for [facility].

## 2022-03-29 ENCOUNTER — Other Ambulatory Visit: Payer: Self-pay | Admitting: Adult Health

## 2022-03-29 DIAGNOSIS — R2681 Unsteadiness on feet: Secondary | ICD-10-CM

## 2022-03-29 DIAGNOSIS — E43 Unspecified severe protein-calorie malnutrition: Secondary | ICD-10-CM

## 2022-03-29 DIAGNOSIS — C3491 Malignant neoplasm of unspecified part of right bronchus or lung: Secondary | ICD-10-CM

## 2022-03-29 NOTE — Telephone Encounter (Signed)
Patient notified of update  and verbalized understanding. 

## 2022-04-02 ENCOUNTER — Inpatient Hospital Stay: Payer: BC Managed Care – PPO

## 2022-04-10 ENCOUNTER — Inpatient Hospital Stay: Payer: BC Managed Care – PPO

## 2022-04-12 ENCOUNTER — Inpatient Hospital Stay: Payer: BC Managed Care – PPO

## 2022-04-12 ENCOUNTER — Telehealth: Payer: Self-pay | Admitting: Adult Health

## 2022-04-12 DIAGNOSIS — K219 Gastro-esophageal reflux disease without esophagitis: Secondary | ICD-10-CM | POA: Diagnosis not present

## 2022-04-12 DIAGNOSIS — G894 Chronic pain syndrome: Secondary | ICD-10-CM | POA: Diagnosis not present

## 2022-04-12 DIAGNOSIS — I129 Hypertensive chronic kidney disease with stage 1 through stage 4 chronic kidney disease, or unspecified chronic kidney disease: Secondary | ICD-10-CM | POA: Diagnosis not present

## 2022-04-12 DIAGNOSIS — I739 Peripheral vascular disease, unspecified: Secondary | ICD-10-CM | POA: Diagnosis not present

## 2022-04-12 DIAGNOSIS — C770 Secondary and unspecified malignant neoplasm of lymph nodes of head, face and neck: Secondary | ICD-10-CM | POA: Diagnosis not present

## 2022-04-12 DIAGNOSIS — Z79891 Long term (current) use of opiate analgesic: Secondary | ICD-10-CM | POA: Diagnosis not present

## 2022-04-12 DIAGNOSIS — D630 Anemia in neoplastic disease: Secondary | ICD-10-CM | POA: Diagnosis not present

## 2022-04-12 DIAGNOSIS — C7951 Secondary malignant neoplasm of bone: Secondary | ICD-10-CM | POA: Diagnosis not present

## 2022-04-12 DIAGNOSIS — I252 Old myocardial infarction: Secondary | ICD-10-CM | POA: Diagnosis not present

## 2022-04-12 DIAGNOSIS — J439 Emphysema, unspecified: Secondary | ICD-10-CM | POA: Diagnosis not present

## 2022-04-12 DIAGNOSIS — Z7951 Long term (current) use of inhaled steroids: Secondary | ICD-10-CM | POA: Diagnosis not present

## 2022-04-12 DIAGNOSIS — C3491 Malignant neoplasm of unspecified part of right bronchus or lung: Secondary | ICD-10-CM | POA: Diagnosis not present

## 2022-04-12 DIAGNOSIS — J302 Other seasonal allergic rhinitis: Secondary | ICD-10-CM | POA: Diagnosis not present

## 2022-04-12 DIAGNOSIS — N183 Chronic kidney disease, stage 3 unspecified: Secondary | ICD-10-CM | POA: Diagnosis not present

## 2022-04-12 DIAGNOSIS — E43 Unspecified severe protein-calorie malnutrition: Secondary | ICD-10-CM | POA: Diagnosis not present

## 2022-04-12 DIAGNOSIS — I2510 Atherosclerotic heart disease of native coronary artery without angina pectoris: Secondary | ICD-10-CM | POA: Diagnosis not present

## 2022-04-12 NOTE — Telephone Encounter (Addendum)
Ok for verbal orders ?

## 2022-04-12 NOTE — Telephone Encounter (Signed)
Verbal orders given to [facility] via confidential vm.

## 2022-04-12 NOTE — Telephone Encounter (Signed)
Seeking approval for physical therapy for 1x1/2x4/1x4

## 2022-04-16 DIAGNOSIS — I739 Peripheral vascular disease, unspecified: Secondary | ICD-10-CM | POA: Diagnosis not present

## 2022-04-16 DIAGNOSIS — C3491 Malignant neoplasm of unspecified part of right bronchus or lung: Secondary | ICD-10-CM | POA: Diagnosis not present

## 2022-04-16 DIAGNOSIS — N183 Chronic kidney disease, stage 3 unspecified: Secondary | ICD-10-CM | POA: Diagnosis not present

## 2022-04-16 DIAGNOSIS — Z79891 Long term (current) use of opiate analgesic: Secondary | ICD-10-CM | POA: Diagnosis not present

## 2022-04-16 DIAGNOSIS — I2510 Atherosclerotic heart disease of native coronary artery without angina pectoris: Secondary | ICD-10-CM | POA: Diagnosis not present

## 2022-04-16 DIAGNOSIS — J439 Emphysema, unspecified: Secondary | ICD-10-CM | POA: Diagnosis not present

## 2022-04-16 DIAGNOSIS — I252 Old myocardial infarction: Secondary | ICD-10-CM | POA: Diagnosis not present

## 2022-04-16 DIAGNOSIS — K219 Gastro-esophageal reflux disease without esophagitis: Secondary | ICD-10-CM | POA: Diagnosis not present

## 2022-04-16 DIAGNOSIS — G894 Chronic pain syndrome: Secondary | ICD-10-CM | POA: Diagnosis not present

## 2022-04-16 DIAGNOSIS — Z7951 Long term (current) use of inhaled steroids: Secondary | ICD-10-CM | POA: Diagnosis not present

## 2022-04-16 DIAGNOSIS — C770 Secondary and unspecified malignant neoplasm of lymph nodes of head, face and neck: Secondary | ICD-10-CM | POA: Diagnosis not present

## 2022-04-16 DIAGNOSIS — D630 Anemia in neoplastic disease: Secondary | ICD-10-CM | POA: Diagnosis not present

## 2022-04-16 DIAGNOSIS — C7951 Secondary malignant neoplasm of bone: Secondary | ICD-10-CM | POA: Diagnosis not present

## 2022-04-16 DIAGNOSIS — J302 Other seasonal allergic rhinitis: Secondary | ICD-10-CM | POA: Diagnosis not present

## 2022-04-16 DIAGNOSIS — E43 Unspecified severe protein-calorie malnutrition: Secondary | ICD-10-CM | POA: Diagnosis not present

## 2022-04-16 DIAGNOSIS — I129 Hypertensive chronic kidney disease with stage 1 through stage 4 chronic kidney disease, or unspecified chronic kidney disease: Secondary | ICD-10-CM | POA: Diagnosis not present

## 2022-04-17 ENCOUNTER — Inpatient Hospital Stay: Payer: BC Managed Care – PPO

## 2022-04-21 ENCOUNTER — Other Ambulatory Visit: Payer: Self-pay | Admitting: Cardiovascular Disease

## 2022-04-22 ENCOUNTER — Ambulatory Visit: Payer: BC Managed Care – PPO | Admitting: Pulmonary Disease

## 2022-04-22 ENCOUNTER — Other Ambulatory Visit: Payer: Self-pay | Admitting: Cardiovascular Disease

## 2022-04-22 NOTE — Progress Notes (Deleted)
Synopsis: Referred in December 2019 for RUL lung nodule by [name], NP  Subjective:   PATIENT ID: Sean Rose GENDER: male DOB: 08/23/1955, MRN: [id number]  No chief complaint on file.   PMH tobacco abuse, COPD, GERD. Recently went to the ED after passing out. He had blood work and a CT chest. CT revealed RUL nodules and the patient was referred here for further. Works in an Marketing executive, manages a Teacher, English as a foreign language supplies. New boarder collie mix in the home. No exotic animals. No recent travel out of the country. Smoker since college, 40+ years, varies 1-1.5ppd. He has attempted to stop in the past. He has used patches, gum and chantix. He had horrible nightmares. He lost 10 to 12 pounds over the past month.  He also has noticed a significant decrease in his appetite.  Patient denies hemoptysis.  Of note he does take Plavix regularly.  He has had cardiac stents as well as femoral stents.  None within the past year.  OV 08/11/2018: pretty stressed out this morning patient's father was taken to [facility], ER this morning and intubated for respiratory failure.  Patient is here today for follow-up of his upper lobe lung nodules.  Since we were last seen he does have cardiology clearance for holding of his Plavix for any planned bronchoscopies.  He had pet imaging which revealed multiple upper lobe PET avid lesions and the reading radiologist was concerned for infectious inflammatory nodules over malignancy.  He has a planned super D image follow-up within the next 2 weeks.  We are here today to review those images and discussed the risk benefits and alternatives of proceeding with bronchoscopy.  Overall he still feels very tired and fatigued.  He has no hemoptysis.  He is trying to stop smoking.  He has been able to decrease this.  Overall the stress of this new information is making it difficult to quit.  OV 06/14/2021: Here today for follow-up.  He has been under  surveillance imaging with Dr. Earlie Server in medical oncology.  Unfortunately he had recurrence of disease with bony lesions despite surgery and adjuvant chemo.  He has received radiation treatments to the shoulder by Dr. Isidore Moos.  Follows with Dr. Earlie Server.  Overall doing okay.  He is a little bit depressed today about his situation but he is says that he wants to keep fighting.  He uses Anoro for the treatment of his COPD wondering if we should change his inhalers around because he still feels short of breath with exertion.  OV 04/22/2022: Here today for follow-up.Patient had a recent syncopal event and fell.  He went to the emergency department on 03/13/2022.  Follow-up outpatient visit with primary care on 03/27/2022.  Office note reviewed.  Last note from Dr. Earlie Server on 01/30/2022 office note reviewed.  Currently getting treated for metastatic non-small cell lung cancer, initial staging was stage IIIa, T3N1, M0 however had recurrence of disease with involvement of the scapula and left supraclavicular area treated with radiation.  Currently on observation.  He completed 11 cycles of Keytruda had to be discontinued due to toxicity involving stomach.    Past Medical History:  Diagnosis Date   Allergy    Anemia    Anginal pain ([place])    Blood transfusion without reported diagnosis    CAD (coronary artery disease)    CAP (community acquired pneumonia) 09/2016   COPD (chronic obstructive pulmonary disease) ([place])    Emphysema of lung ([place])  GERD (gastroesophageal reflux disease)    Heart murmur    "I was told I had one when I was a kid"   Hemorrhoids    History of anal fissures    "no surgeries" (10/30/2016)   Hyperlipidemia    Hypertension    lung ca dx'd 08/2018   with mets to bones in arms   Myocardial infarction [facility]    Peripheral arterial disease ([place])    status post right common iliac artery stenting back in 2007   Seasonal allergies    Tobacco abuse      Family History  Problem Relation  Age of Onset   Colon cancer Mother    Heart disease Father    Heart disease Paternal Grandfather    Rectal cancer Other    Esophageal cancer Neg Hx    Stomach cancer Neg Hx      Past Surgical History:  Procedure Laterality Date   ANKLE SURGERY Left    "rebuilt it"   ANTERIOR CRUCIATE LIGAMENT REPAIR Right    CARDIAC CATHETERIZATION  09/23/2008   Continued medical therapy - may need GI evaluation in addition.   CARDIAC CATHETERIZATION  10/28/2007   Medical therapy recommended.   CARDIAC CATHETERIZATION  11/18/2006   In-stent restenosis RCA  (50% distal edge, 80% segmental mid, and 50-60% segmental proximal). Successful cutting balloon atherectomy using a [id number] cutting balloon. 3 inflations with atherectomy performed on mid and proximal portions resulting in reduction of 80% mid in-stent restenosis to less than 20% residual and 50-60% segmental proximal to less than 20% residual without dissection.   CARDIAC CATHETERIZATION  02/26/2006   Severe stenosis in RCA. Stenting performed using IVUS. 3.5x20 Maverick balloon deployed at [employer]. Distal stent-a 4x28 Liberte stent-deployed 12atm 48sec, 12atm 31sec, 4atm 19sec. Mid stent-a 4x28 Liberte stent-deployed 14atm 45sec, 14atm 60sec, 14atm 44sec. Proximal stent-4x8 Liberte- 14atm 45sec,14atm 47sec, 16atm 43sec. Severely diseased segment then appeared TIMI-3 flow.   CARDIOVASCULAR STRESS TEST  11/17/2012   No significant ECG changes. Septal perfusion defect is new when complared to study from 2010. Abnormal myocardial perfusion imaging with a basal to mid perfusion suggestive of previous MI.   CAROTID DOPPLER  08/09/2011   Bilateral Bulb/Proximal ICA - demonstrated a mild amount of fibrous plaque without evidence of significant diameter reduction reduction or other vascular abnormality.   CHEST TUBE INSERTION Right 09/02/2018   Procedure: Chest Tube Insertion;  Surgeon: Garner Nash, DO;  Location: [place];  Service: Thoracic;  Laterality: Right;    COLONOSCOPY     2003, 2014   CORONARY ANGIOPLASTY WITH STENT PLACEMENT     FEMORAL ARTERY STENT     INGUINAL HERNIA REPAIR Right    IR IMAGING GUIDED PORT INSERTION  11/13/2020   KNEE ARTHROSCOPY Right "multiple"   LAPAROSCOPIC APPENDECTOMY N/A 12/07/2018   Procedure: APPENDECTOMY LAPAROSCOPIC;  Surgeon: Coralie Keens, MD;  Location: [facility];  Service: General;  Laterality: N/A;   LOWER EXTREMITY ARTERIAL DOPPLER  01/31/2011   Bilateral ABIs-normal values with no suggestion of arterial insuff to the lower extremities at rest. Right CIA stent-mild amount of nonhemodynamically significant plaque is noted throughout   [place]   "bone-eating tumor"   PERCUTANEOUS STENT INTERVENTION  04/04/2006 & 04/13/2015   a. Right common iliac artery with an 8.0x18 mm Herculink stent deployed at 12 atm. Stenosis was reduced from 80% to 0% with brisk flow. b. I-cast stenting to left common iliac artery   PERIPHERAL VASCULAR CATHETERIZATION N/A 04/13/2015  Procedure: Lower Extremity Angiography;  Surgeon: Lorretta Harp, MD; L-oCIA 75%, 40-50% L-EIA, R-CIA stent patent, s/p 8 mm x 38 mm ICast covered stent>>0% stenosis in [place] Right    TRANSTHORACIC ECHOCARDIOGRAM  11/26/2012   EF not noted. Aortic valve-sclerosis without stenosis, no regurgiation.    UPPER GASTROINTESTINAL ENDOSCOPY     US CAROTID DOPPLER BILATERAL ([place] [facility])  08/09/2011   Bilateral Bulb/Proximal ICAa demonstrated a mild amount of fibrous plaque without evidence of significant diameter reduction or any other vascular abnormality.   VIDEO ASSISTED THORACOSCOPY (VATS)/ LOBECTOMY Right 09/14/2018   Procedure: RIGHT VIDEO ASSISTED THORACOSCOPY (VATS)/ RIGHT UPPER LOBECTOMY;  Surgeon: Melrose Nakayama, MD;  Location: [place];  Service: Thoracic;  Laterality: Right;   VIDEO BRONCHOSCOPY WITH ENDOBRONCHIAL NAVIGATION N/A 09/02/2018   Procedure: VIDEO BRONCHOSCOPY WITH ENDOBRONCHIAL  NAVIGATION;  Surgeon: Garner Nash, DO;  Location: [facility];  Service: Thoracic;  Laterality: N/A;    Social History   Socioeconomic History   Marital status: Married    Spouse name: Not on file   Number of children: 2   Years of education: Not on file   Highest education level: Not on file  Occupational History   Occupation: retired  Tobacco Use   Smoking status: Former    Packs/day: 0.25    Years: 38.00    Total pack years: 9.50    Types: Cigarettes    Quit date: 10/19/2018    Years since quitting: 3.5   Smokeless tobacco: Never   Tobacco comments:    pack per day 12.17.19  Vaping Use   Vaping Use: Former  Substance and Sexual Activity   Alcohol use: Not Currently    Comment: socially   Drug use: No   Sexual activity: Yes  Other Topics Concern   Not on file  Social History Narrative   Patient is married with 2 children   Former smoker no tobacco now no drug use occasional/social alcohol   Social Determinants of Radio broadcast assistant Strain: Not on file  Food Insecurity: Not on file  Transportation Needs: Not on file  Physical Activity: Not on file  Stress: Not on file  Social Connections: Not on file  Intimate Partner Violence: Not on file     Allergies  Allergen Reactions   Compazine [Prochlorperazine] Other (See Comments)    " made him high and could not sleep". He does not want to take it again.     Outpatient Medications Prior to Visit  Medication Sig Dispense Refill   acetaminophen (TYLENOL) 500 MG tablet Take 1,000 mg by mouth every 6 (six) hours as needed for headache.     amLODipine (NORVASC) 5 MG tablet Take 1 tablet (5 mg total) by mouth daily. 180 tablet 3   aspirin EC 81 MG tablet Take 81 mg by mouth daily.     clopidogrel (PLAVIX) 75 MG tablet TAKE 1 TABLET BY MOUTH EVERY DAY (Patient taking differently: Take 75 mg by mouth daily.) 90 tablet 3   esomeprazole (NEXIUM) 20 MG capsule Take 40 mg by mouth daily.     fexofenadine (ALLEGRA)  60 MG tablet Take 60 mg by mouth daily.     Fluticasone-Umeclidin-Vilant (TRELEGY ELLIPTA) 100-62.5-25 MCG/ACT AEPB Inhale 1 puff into the lungs daily. 60 each 3   folic acid (FOLVITE) 1 MG tablet TAKE 1 TABLET(1 MG) BY MOUTH DAILY (Patient taking differently: Take 1 mg by mouth daily.)  30 tablet 4   Magnesium 500 MG TABS Take 1 tablet (500 mg total) by mouth 3 (three) times daily. 8 tablet 0   metoprolol succinate (TOPROL-XL) 25 MG 24 hr tablet Take 1 tablet (25 mg total) by mouth daily. SCHEDULE OFFICE VISIT FOR FUTURE REFILLS. 30 tablet 0   mirtazapine (REMERON) 15 MG tablet Take 1 tablet (15 mg total) by mouth at bedtime. 90 tablet 0   Multiple Vitamin (MULTIVITAMIN WITH MINERALS) TABS tablet Take 1 tablet by mouth daily.     rosuvastatin (CRESTOR) 5 MG tablet TAKE 1 TABLET(5 MG) BY MOUTH EVERY OTHER DAY (Patient taking differently: Take 5 mg by mouth every other day.) 45 tablet 3   traMADol (ULTRAM) 50 MG tablet Take 1 tablet (50 mg total) by mouth 2 (two) times daily as needed. 60 tablet 0   Facility-Administered Medications Prior to Visit  Medication Dose Route Frequency Provider Last Rate Last Admin   diphenhydrAMINE (BENADRYL) 25 mg capsule             Review of Systems  Constitutional:  Positive for malaise/fatigue and weight loss. Negative for chills and fever.  HENT:  Negative for hearing loss, sore throat and tinnitus.   Eyes:  Negative for blurred vision and double vision.  Respiratory:  Positive for shortness of breath. Negative for cough, hemoptysis, sputum production, wheezing and stridor.   Cardiovascular:  Negative for chest pain, palpitations, orthopnea, leg swelling and PND.  Gastrointestinal:  Negative for abdominal pain, constipation, diarrhea, heartburn, nausea and vomiting.  Genitourinary:  Negative for dysuria, hematuria and urgency.  Musculoskeletal:  Negative for joint pain and myalgias.  Skin:  Negative for itching and rash.  Neurological:  Negative for  dizziness, tingling, weakness and headaches.  Endo/Heme/Allergies:  Negative for environmental allergies. Does not bruise/bleed easily.  Psychiatric/Behavioral:  Negative for depression. The patient is not nervous/anxious and does not have insomnia.   All other systems reviewed and are negative.    Objective:  Physical Exam Vitals reviewed.  Constitutional:      General: He is not in acute distress.    Appearance: He is well-developed.     Comments: Thin, frail.  HENT:     Head: Normocephalic and atraumatic.  Eyes:     General: No scleral icterus.    Conjunctiva/sclera: Conjunctivae normal.     Pupils: Pupils are equal, round, and reactive to light.  Neck:     Vascular: No JVD.     Trachea: No tracheal deviation.  Cardiovascular:     Rate and Rhythm: Normal rate and regular rhythm.     Heart sounds: Normal heart sounds. No murmur heard. Pulmonary:     Effort: Pulmonary effort is normal. No tachypnea, accessory muscle usage or respiratory distress.     Breath sounds: No stridor. No wheezing, rhonchi or rales.  Abdominal:     General: Bowel sounds are normal. There is no distension.     Palpations: Abdomen is soft.     Tenderness: There is no abdominal tenderness.  Musculoskeletal:        General: No tenderness.     Cervical back: Neck supple.  Lymphadenopathy:     Cervical: No cervical adenopathy.  Skin:    General: Skin is warm and dry.     Capillary Refill: Capillary refill takes less than 2 seconds.     Findings: No rash.  Neurological:     Mental Status: He is alert and oriented to person, place, and time.  Psychiatric:  Behavior: Behavior normal.      There were no vitals filed for this visit.    on RA BMI Readings from Last 3 Encounters:  03/27/22 16.82 kg/m  03/13/22 16.36 kg/m  01/30/22 16.22 kg/m   Wt Readings from Last 3 Encounters:  03/27/22 109 lb (49.4 kg)  03/13/22 106 lb (48.1 kg)  01/30/22 106 lb 11.2 oz (48.4 kg)     CBC     Component Value Date/Time   WBC 8.5 03/14/2022 0303   RBC 2.63 (L) 03/14/2022 0303   RBC 2.60 (L) 03/14/2022 0303   HGB 8.6 (L) 03/14/2022 0303   HGB 9.2 (L) 01/25/2022 1006   HCT 24.7 (L) 03/14/2022 0303   PLT 479 (H) 03/14/2022 0303   PLT 523 (H) 01/25/2022 1006   MCV 93.9 03/14/2022 0303   MCH 32.7 03/14/2022 0303   MCHC 34.8 03/14/2022 0303   RDW 14.0 03/14/2022 0303   LYMPHSABS 2.0 03/13/2022 1755   MONOABS 1.2 (H) 03/13/2022 1755   EOSABS 0.4 03/13/2022 1755   BASOSABS 0.1 03/13/2022 1755    Chest Imaging:  07/08/2018: Right upper lobe spiculated lesion, 1.1 x 1.1 x 1.4 cm, additional more posterior upper lobe against the major fissure 2 x 1 x 1 cm nodule with associated groundglass digit in all central advised upper lobe nodule as well.  There is multiple associated areas of significant centrilobular emphysema.  07/24/2018: Nuclear medicine pet imaging CT 3 upper lobe nodules all PET avid.  Reading radiologist concern for infectious over malignancy. The patient's images have been independently reviewed by me.    Pulmonary Functions Testing Results:    Latest Ref Rng & Units 07/15/2018    1:50 PM  PFT Results  FVC-Pre L 3.27   FVC-Predicted Pre % 74   FVC-Post L 4.81   FVC-Predicted Post % 109   Pre FEV1/FVC % % 90   Post FEV1/FCV % % 62   FEV1-Pre L 2.95   FEV1-Predicted Pre % 89   FEV1-Post L 2.99   DLCO uncorrected ml/min/mmHg 18.46   DLCO UNC% % 62   DLVA Predicted % 59   TLC L 7.58   TLC % Predicted % 114   RV % Predicted % 149     FeNO: None   Pathology: None   Echocardiogram: None   Heart Catheterization: None     Assessment & Plan:   No diagnosis found.  Discussion:  This is a 66 year old gentleman, upper lobe pulmonary nodules on the right, had follow-up pet imaging with 3 areas of PET avid disease.  Underwent biopsy had a postop pneumothorax and air leak.  Received a lobectomy following that for resection of the 3 lesions.  Had adjuvant  chemotherapy.  Under surveillance imaging with medical oncology.  Unfortunately pack in the summer had a new bone lesion concerning for metastatic disease underwent chemotherapy and radiation to this lesion.  Plan: From management of his COPD standpoint I think we can start him with Trelegy Stop Anoro Continue albuterol as needed New prescription for DME supply for albuterol nebulizer and albuterol solution. Continue to follow-up with medical oncology. He is to let us know how he is doing with his new regimen. Can see [place] back in 6 months or if he needs to be seen sooner    Current Outpatient Medications:    acetaminophen (TYLENOL) 500 MG tablet, Take 1,000 mg by mouth every 6 (six) hours as needed for headache., Disp: , Rfl:    amLODipine (NORVASC) 5  MG tablet, Take 1 tablet (5 mg total) by mouth daily., Disp: 180 tablet, Rfl: 3   aspirin EC 81 MG tablet, Take 81 mg by mouth daily., Disp: , Rfl:    clopidogrel (PLAVIX) 75 MG tablet, TAKE 1 TABLET BY MOUTH EVERY DAY (Patient taking differently: Take 75 mg by mouth daily.), Disp: 90 tablet, Rfl: 3   esomeprazole (NEXIUM) 20 MG capsule, Take 40 mg by mouth daily., Disp: , Rfl:    fexofenadine (ALLEGRA) 60 MG tablet, Take 60 mg by mouth daily., Disp: , Rfl:    Fluticasone-Umeclidin-Vilant (TRELEGY ELLIPTA) 100-62.5-25 MCG/ACT AEPB, Inhale 1 puff into the lungs daily., Disp: 60 each, Rfl: 3   folic acid (FOLVITE) 1 MG tablet, TAKE 1 TABLET(1 MG) BY MOUTH DAILY (Patient taking differently: Take 1 mg by mouth daily.), Disp: 30 tablet, Rfl: 4   Magnesium 500 MG TABS, Take 1 tablet (500 mg total) by mouth 3 (three) times daily., Disp: 8 tablet, Rfl: 0   metoprolol succinate (TOPROL-XL) 25 MG 24 hr tablet, Take 1 tablet (25 mg total) by mouth daily. SCHEDULE OFFICE VISIT FOR FUTURE REFILLS., Disp: 30 tablet, Rfl: 0   mirtazapine (REMERON) 15 MG tablet, Take 1 tablet (15 mg total) by mouth at bedtime., Disp: 90 tablet, Rfl: 0   Multiple Vitamin  (MULTIVITAMIN WITH MINERALS) TABS tablet, Take 1 tablet by mouth daily., Disp: , Rfl:    rosuvastatin (CRESTOR) 5 MG tablet, TAKE 1 TABLET(5 MG) BY MOUTH EVERY OTHER DAY (Patient taking differently: Take 5 mg by mouth every other day.), Disp: 45 tablet, Rfl: 3   traMADol (ULTRAM) 50 MG tablet, Take 1 tablet (50 mg total) by mouth 2 (two) times daily as needed., Disp: 60 tablet, Rfl: 0 No current facility-administered medications for this visit.  Facility-Administered Medications Ordered in Other Visits:    diphenhydrAMINE (BENADRYL) 25 mg capsule, , , ,    Garner Nash, DO [facility] 04/22/2022 8:45 AM

## 2022-04-30 ENCOUNTER — Encounter: Payer: Self-pay | Admitting: Adult Health

## 2022-04-30 ENCOUNTER — Ambulatory Visit (INDEPENDENT_AMBULATORY_CARE_PROVIDER_SITE_OTHER): Payer: BC Managed Care – PPO | Admitting: Adult Health

## 2022-04-30 VITALS — BP 114/86 | HR 72 | Temp 97.7°F | Wt 98.4 lb

## 2022-04-30 DIAGNOSIS — R2681 Unsteadiness on feet: Secondary | ICD-10-CM

## 2022-04-30 DIAGNOSIS — E43 Unspecified severe protein-calorie malnutrition: Secondary | ICD-10-CM | POA: Diagnosis not present

## 2022-04-30 DIAGNOSIS — C3491 Malignant neoplasm of unspecified part of right bronchus or lung: Secondary | ICD-10-CM

## 2022-04-30 MED ORDER — TRELEGY ELLIPTA 100-62.5-25 MCG/ACT IN AEPB: 1.0000 | INHALATION_SPRAY | Freq: Every day | RESPIRATORY_TRACT | 3 refills | Status: DC

## 2022-04-30 NOTE — Progress Notes (Signed)
Subjective:    Patient ID: Sean Rose, male    DOB: 03-09-1956, 66 y.o.   MRN: [id number]  HPI 66 year old male who  has a past medical history of Allergy, Anemia, Anginal pain ([place]), Blood transfusion without reported diagnosis, CAD (coronary artery disease), CAP (community acquired pneumonia) (09/2016), COPD (chronic obstructive pulmonary disease) ([place]), Emphysema of lung ([place]), GERD (gastroesophageal reflux disease), Heart murmur, Hemorrhoids, History of anal fissures, Hyperlipidemia, Hypertension, lung ca (dx'd 08/2018), Myocardial infarction [facility]), Peripheral arterial disease ([place]), Seasonal allergies, and Tobacco abuse.  He presents to the office today with his wife for 1 month follow-up regarding their protein calorie malnutrition, generalized weakness, and cervical spine pain.  When he was last seen 1 month ago it was the first time in 4 years as he had been going through treatment for non-small cell lung cancer with mets to the scapula and left supraclavicular lymph nodes.  His weight was down to 109 pounds, he had decreased appetite, his gait was unsteady and he had had a few falls and he had worsening cervical spine pain.  This time we placed him on Remeron 15 mg nightly,and he was referred to home health for OT PT.  Today he reports that he did not find home health physical therapy to be beneficial, patient reports that he came once on the initial visit and then have not come back since.  His wife reports that he has advised him not to come back because they called at an opportune times.  He also did not find occupational therapy helpful.  Instead of doing at home he would like to be referred to [facility] physical therapy PT department.  In regards to weight loss due to decreased appetite, when he started Remeron 15 mg he did report some improvement in his appetite but this waned and his appetite diminished.  He reports that "I just do not feel [place] eating any longer, it is  hard for me to eat anything besides a soft diet and I get sick of that very easily".  His weight is down roughly another 10 pounds.   Wt Readings from Last 3 Encounters:  04/30/22 98 lb 6.4 oz (44.6 kg)  03/27/22 109 lb (49.4 kg)  03/13/22 106 lb (48.1 kg)   He also needs a prescription for a wheelchair and needs me to send in a refill of his Trelegy Ellipta inhaler that was prescribed by pulmonary.  He does not feel as though he has the energy right now to go to multiple appointments.   Review of Systems See HPI   Past Medical History:  Diagnosis Date   Allergy    Anemia    Anginal pain ([place])    Blood transfusion without reported diagnosis    CAD (coronary artery disease)    CAP (community acquired pneumonia) 09/2016   COPD (chronic obstructive pulmonary disease) ([facility])    Emphysema of lung ([facility])    GERD (gastroesophageal reflux disease)    Heart murmur    "I was told I had one when I was a kid"   Hemorrhoids    History of anal fissures    "no surgeries" (10/30/2016)   Hyperlipidemia    Hypertension    lung ca dx'd 08/2018   with mets to bones in arms   Myocardial infarction [facility])    Peripheral arterial disease ([place])    status post right common iliac artery stenting back in 2007   Seasonal allergies    Tobacco  abuse     Social History   Socioeconomic History   Marital status: Married    Spouse name: Not on file   Number of children: 2   Years of education: Not on file   Highest education level: Not on file  Occupational History   Occupation: retired  Tobacco Use   Smoking status: Former    Packs/day: 0.25    Years: 38.00    Total pack years: 9.50    Types: Cigarettes    Quit date: 10/19/2018    Years since quitting: 3.5   Smokeless tobacco: Never   Tobacco comments:    pack per day 12.17.19  Vaping Use   Vaping Use: Former  Substance and Sexual Activity   Alcohol use: Not Currently    Comment: socially   Drug use: No   Sexual activity: Yes  Other Topics  Concern   Not on file  Social History Narrative   Patient is married with 2 children   Former smoker no tobacco now no drug use occasional/social alcohol   Social Determinants of Radio broadcast assistant Strain: Not on file  Food Insecurity: Not on file  Transportation Needs: Not on file  Physical Activity: Not on file  Stress: Not on file  Social Connections: Not on file  Intimate Partner Violence: Not on file    Past Surgical History:  Procedure Laterality Date   ANKLE SURGERY Left    "rebuilt it"   ANTERIOR CRUCIATE LIGAMENT REPAIR Right    CARDIAC CATHETERIZATION  09/23/2008   Continued medical therapy - may need GI evaluation in addition.   CARDIAC CATHETERIZATION  10/28/2007   Medical therapy recommended.   CARDIAC CATHETERIZATION  11/18/2006   In-stent restenosis RCA  (50% distal edge, 80% segmental mid, and 50-60% segmental proximal). Successful cutting balloon atherectomy using a [id number] cutting balloon. 3 inflations with atherectomy performed on mid and proximal portions resulting in reduction of 80% mid in-stent restenosis to less than 20% residual and 50-60% segmental proximal to less than 20% residual without dissection.   CARDIAC CATHETERIZATION  02/26/2006   Severe stenosis in RCA. Stenting performed using IVUS. 3.5x20 Maverick balloon deployed at [employer]. Distal stent-a 4x28 Liberte stent-deployed 12atm 48sec, 12atm 31sec, 4atm 19sec. Mid stent-a 4x28 Liberte stent-deployed 14atm 45sec, 14atm 60sec, 14atm 44sec. Proximal stent-4x8 Liberte- 14atm 45sec,14atm 47sec, 16atm 43sec. Severely diseased segment then appeared TIMI-3 flow.   CARDIOVASCULAR STRESS TEST  11/17/2012   No significant ECG changes. Septal perfusion defect is new when complared to study from 2010. Abnormal myocardial perfusion imaging with a basal to mid perfusion suggestive of previous MI.   CAROTID DOPPLER  08/09/2011   Bilateral Bulb/Proximal ICA - demonstrated a mild amount of fibrous plaque without  evidence of significant diameter reduction reduction or other vascular abnormality.   CHEST TUBE INSERTION Right 09/02/2018   Procedure: Chest Tube Insertion;  Surgeon: Garner Nash, DO;  Location: [facility];  Service: Thoracic;  Laterality: Right;   COLONOSCOPY     2003, 2014   CORONARY ANGIOPLASTY WITH STENT PLACEMENT     FEMORAL ARTERY STENT     INGUINAL HERNIA REPAIR Right    IR IMAGING GUIDED PORT INSERTION  11/13/2020   KNEE ARTHROSCOPY Right "multiple"   LAPAROSCOPIC APPENDECTOMY N/A 12/07/2018   Procedure: APPENDECTOMY LAPAROSCOPIC;  Surgeon: Coralie Keens, MD;  Location: [facility];  Service: General;  Laterality: N/A;   LOWER EXTREMITY ARTERIAL DOPPLER  01/31/2011   Bilateral ABIs-normal values with no suggestion of  arterial insuff to the lower extremities at rest. Right CIA stent-mild amount of nonhemodynamically significant plaque is noted throughout   [place]   "bone-eating tumor"   PERCUTANEOUS STENT INTERVENTION  04/04/2006 & 04/13/2015   a. Right common iliac artery with an 8.0x18 mm Herculink stent deployed at 12 atm. Stenosis was reduced from 80% to 0% with brisk flow. b. I-cast stenting to left common iliac artery   PERIPHERAL VASCULAR CATHETERIZATION N/A 04/13/2015   Procedure: Lower Extremity Angiography;  Surgeon: Lorretta Harp, MD; L-oCIA 75%, 40-50% L-EIA, R-CIA stent patent, s/p 8 mm x 38 mm ICast covered stent>>0% stenosis in [place] Right    TRANSTHORACIC ECHOCARDIOGRAM  11/26/2012   EF not noted. Aortic valve-sclerosis without stenosis, no regurgiation.    UPPER GASTROINTESTINAL ENDOSCOPY     US CAROTID DOPPLER BILATERAL ([place] [facility]X)  08/09/2011   Bilateral Bulb/Proximal ICAa demonstrated a mild amount of fibrous plaque without evidence of significant diameter reduction or any other vascular abnormality.   VIDEO ASSISTED THORACOSCOPY (VATS)/ LOBECTOMY Right 09/14/2018   Procedure: RIGHT VIDEO ASSISTED  THORACOSCOPY (VATS)/ RIGHT UPPER LOBECTOMY;  Surgeon: Melrose Nakayama, MD;  Location: [facility];  Service: Thoracic;  Laterality: Right;   VIDEO BRONCHOSCOPY WITH ENDOBRONCHIAL NAVIGATION N/A 09/02/2018   Procedure: VIDEO BRONCHOSCOPY WITH ENDOBRONCHIAL NAVIGATION;  Surgeon: Garner Nash, DO;  Location: [facility];  Service: Thoracic;  Laterality: N/A;    Family History  Problem Relation Age of Onset   Colon cancer Mother    Heart disease Father    Heart disease Paternal Grandfather    Rectal cancer Other    Esophageal cancer Neg Hx    Stomach cancer Neg Hx     Allergies  Allergen Reactions   Compazine [Prochlorperazine] Other (See Comments)    " made him high and could not sleep". He does not want to take it again.    Current Outpatient Medications on File Prior to Visit  Medication Sig Dispense Refill   acetaminophen (TYLENOL) 500 MG tablet Take 1,000 mg by mouth every 6 (six) hours as needed for headache.     aspirin EC 81 MG tablet Take 81 mg by mouth daily.     clopidogrel (PLAVIX) 75 MG tablet TAKE 1 TABLET BY MOUTH EVERY DAY (Patient taking differently: Take 75 mg by mouth daily.) 90 tablet 3   esomeprazole (NEXIUM) 20 MG capsule Take 40 mg by mouth daily.     fexofenadine (ALLEGRA) 60 MG tablet Take 60 mg by mouth daily.     Fluticasone-Umeclidin-Vilant (TRELEGY ELLIPTA) 100-62.5-25 MCG/ACT AEPB Inhale 1 puff into the lungs daily. 60 each 3   folic acid (FOLVITE) 1 MG tablet TAKE 1 TABLET(1 MG) BY MOUTH DAILY (Patient taking differently: Take 1 mg by mouth daily.) 30 tablet 4   Magnesium 500 MG TABS Take 1 tablet (500 mg total) by mouth 3 (three) times daily. 8 tablet 0   metoprolol succinate (TOPROL-XL) 25 MG 24 hr tablet TAKE 1 TABLET BY MOUTH DAILY. SCHEDULE OFFICE VISIT FOR FUTURE REFILLS 30 tablet 0   mirtazapine (REMERON) 15 MG tablet Take 1 tablet (15 mg total) by mouth at bedtime. 90 tablet 0   Multiple Vitamin (MULTIVITAMIN WITH MINERALS) TABS tablet Take 1 tablet by  mouth daily.     rosuvastatin (CRESTOR) 5 MG tablet TAKE 1 TABLET(5 MG) BY MOUTH EVERY OTHER DAY (Patient taking differently: Take 5 mg by mouth every other day.)  45 tablet 3   amLODipine (NORVASC) 5 MG tablet Take 1 tablet (5 mg total) by mouth daily. 180 tablet 3   Current Facility-Administered Medications on File Prior to Visit  Medication Dose Route Frequency Provider Last Rate Last Admin   diphenhydrAMINE (BENADRYL) 25 mg capsule             BP 114/86 (BP Location: Left Arm, Patient Position: Sitting, Cuff Size: Normal)   Pulse 72   Temp 97.7 F (36.5 C) (Oral)   Wt 98 lb 6.4 oz (44.6 kg)   SpO2 97%   BMI 15.18 kg/m       Objective:   Physical Exam Vitals and nursing note reviewed.  Constitutional:      Appearance: Normal appearance. He is cachectic. He is ill-appearing (chronically).  Cardiovascular:     Rate and Rhythm: Normal rate and regular rhythm.     Pulses: Normal pulses.     Heart sounds: Normal heart sounds.  Pulmonary:     Effort: Pulmonary effort is normal.     Breath sounds: Normal breath sounds.  Skin:    General: Skin is warm and dry.  Neurological:     General: No focal deficit present.     Mental Status: He is alert and oriented to person, place, and time.  Psychiatric:        Mood and Affect: Mood normal.        Behavior: Behavior is agitated.        Thought Content: Thought content normal.       Assessment & Plan:  1. Severe protein-calorie malnutrition ([place]) -Likely due to through metastatic cancer.  Discussed having palliative care come into the home for evaluation but he refused at this time.  We will have him increase his Remeron to 45 mg nightly to see if this will stimulate his appetite.  He has plenty of 15 mg tabs at home so he will use these.  Follow-up in 1 month  2. Adenocarcinoma of right lung, stage 3 ([place]) - Per oncology   3. Gait instability  - Ambulatory referral to Physical Therapy  [name], NP  Time spent with  patient today was 42 minutes which consisted of chart review, discussing malnutrition d/t metastatic cancer, gait instability due to muscle atrophy,work up, treatment answering questions and documentation.

## 2022-05-08 ENCOUNTER — Ambulatory Visit: Payer: BC Managed Care – PPO | Attending: Adult Health

## 2022-05-08 ENCOUNTER — Other Ambulatory Visit: Payer: Self-pay

## 2022-05-08 ENCOUNTER — Other Ambulatory Visit: Payer: Self-pay | Admitting: Physician Assistant

## 2022-05-08 DIAGNOSIS — R252 Cramp and spasm: Secondary | ICD-10-CM | POA: Diagnosis not present

## 2022-05-08 DIAGNOSIS — Z9181 History of falling: Secondary | ICD-10-CM | POA: Diagnosis not present

## 2022-05-08 DIAGNOSIS — M6281 Muscle weakness (generalized): Secondary | ICD-10-CM | POA: Diagnosis not present

## 2022-05-08 DIAGNOSIS — R2681 Unsteadiness on feet: Secondary | ICD-10-CM | POA: Insufficient documentation

## 2022-05-08 DIAGNOSIS — R262 Difficulty in walking, not elsewhere classified: Secondary | ICD-10-CM | POA: Diagnosis not present

## 2022-05-08 NOTE — Therapy (Signed)
OUTPATIENT PHYSICAL THERAPY LOWER EXTREMITY EVALUATION   Patient Name: [name] MRN: [id number] DOB:1955/09/01, 66 y.o., male Today's Date: 05/08/2022   PT End of Session - 05/08/22 1238     Visit Number 1    Authorization Type BLUE CROSS BLUE [employer]    PT Start Time 50    PT Stop Time 1325    PT Time Calculation (min) 55 min    Activity Tolerance Patient limited by fatigue;Patient limited by lethargy    Behavior During Therapy [facility] for tasks assessed/performed             Past Medical History:  Diagnosis Date   Allergy    Anemia    Anginal pain ([place])    Blood transfusion without reported diagnosis    CAD (coronary artery disease)    CAP (community acquired pneumonia) 09/2016   COPD (chronic obstructive pulmonary disease) ([place])    Emphysema of lung ([place])    GERD (gastroesophageal reflux disease)    Heart murmur    "I was told I had one when I was a kid"   Hemorrhoids    History of anal fissures    "no surgeries" (10/30/2016)   Hyperlipidemia    Hypertension    lung ca dx'd 08/2018   with mets to bones in arms   Myocardial infarction [facility])    Peripheral arterial disease ([place])    status post right common iliac artery stenting back in 2007   Seasonal allergies    Tobacco abuse    Past Surgical History:  Procedure Laterality Date   ANKLE SURGERY Left    "rebuilt it"   ANTERIOR CRUCIATE LIGAMENT REPAIR Right    CARDIAC CATHETERIZATION  09/23/2008   Continued medical therapy - may need GI evaluation in addition.   CARDIAC CATHETERIZATION  10/28/2007   Medical therapy recommended.   CARDIAC CATHETERIZATION  11/18/2006   In-stent restenosis RCA  (50% distal edge, 80% segmental mid, and 50-60% segmental proximal). Successful cutting balloon atherectomy using a [id number] cutting balloon. 3 inflations with atherectomy performed on mid and proximal portions resulting in reduction of 80% mid in-stent restenosis to less than 20% residual and 50-60% segmental proximal to  less than 20% residual without dissection.   CARDIAC CATHETERIZATION  02/26/2006   Severe stenosis in RCA. Stenting performed using IVUS. 3.5x20 Maverick balloon deployed at [employer]. Distal stent-a 4x28 Liberte stent-deployed 12atm 48sec, 12atm 31sec, 4atm 19sec. Mid stent-a 4x28 Liberte stent-deployed 14atm 45sec, 14atm 60sec, 14atm 44sec. Proximal stent-4x8 Liberte- 14atm 45sec,14atm 47sec, 16atm 43sec. Severely diseased segment then appeared TIMI-3 flow.   CARDIOVASCULAR STRESS TEST  11/17/2012   No significant ECG changes. Septal perfusion defect is new when complared to study from 2010. Abnormal myocardial perfusion imaging with a basal to mid perfusion suggestive of previous MI.   CAROTID DOPPLER  08/09/2011   Bilateral Bulb/Proximal ICA - demonstrated a mild amount of fibrous plaque without evidence of significant diameter reduction reduction or other vascular abnormality.   CHEST TUBE INSERTION Right 09/02/2018   Procedure: Chest Tube Insertion;  Surgeon: Garner Nash, DO;  Location: [facility];  Service: Thoracic;  Laterality: Right;   COLONOSCOPY     2003, 2014   CORONARY ANGIOPLASTY WITH STENT PLACEMENT     FEMORAL ARTERY STENT     INGUINAL HERNIA REPAIR Right    IR IMAGING GUIDED PORT INSERTION  11/13/2020   KNEE ARTHROSCOPY Right "multiple"   LAPAROSCOPIC APPENDECTOMY N/A 12/07/2018   Procedure: APPENDECTOMY LAPAROSCOPIC;  Surgeon: Ninfa Linden,  Nathaneil Canary, MD;  Location: [facility];  Service: General;  Laterality: N/A;   LOWER EXTREMITY ARTERIAL DOPPLER  01/31/2011   Bilateral ABIs-normal values with no suggestion of arterial insuff to the lower extremities at rest. Right CIA stent-mild amount of nonhemodynamically significant plaque is noted throughout   [place]   "bone-eating tumor"   PERCUTANEOUS STENT INTERVENTION  04/04/2006 & 04/13/2015   a. Right common iliac artery with an 8.0x18 mm Herculink stent deployed at 12 atm. Stenosis was reduced from 80% to 0% with brisk flow. b.  I-cast stenting to left common iliac artery   PERIPHERAL VASCULAR CATHETERIZATION N/A 04/13/2015   Procedure: Lower Extremity Angiography;  Surgeon: Lorretta Harp, MD; L-oCIA 75%, 40-50% L-EIA, R-CIA stent patent, s/p 8 mm x 38 mm ICast covered stent>>0% stenosis in [place] Right    TRANSTHORACIC ECHOCARDIOGRAM  11/26/2012   EF not noted. Aortic valve-sclerosis without stenosis, no regurgiation.    UPPER GASTROINTESTINAL ENDOSCOPY     US CAROTID DOPPLER BILATERAL ([place] [facility])  08/09/2011   Bilateral Bulb/Proximal ICAa demonstrated a mild amount of fibrous plaque without evidence of significant diameter reduction or any other vascular abnormality.   VIDEO ASSISTED THORACOSCOPY (VATS)/ LOBECTOMY Right 09/14/2018   Procedure: RIGHT VIDEO ASSISTED THORACOSCOPY (VATS)/ RIGHT UPPER LOBECTOMY;  Surgeon: Melrose Nakayama, MD;  Location: [facility];  Service: Thoracic;  Laterality: Right;   VIDEO BRONCHOSCOPY WITH ENDOBRONCHIAL NAVIGATION N/A 09/02/2018   Procedure: VIDEO BRONCHOSCOPY WITH ENDOBRONCHIAL NAVIGATION;  Surgeon: Garner Nash, DO;  Location: [place];  Service: Thoracic;  Laterality: N/A;   Patient Active Problem List   Diagnosis Date Noted   CKD (chronic kidney disease) stage 3, GFR 30-59 ml/min ([place]) 03/14/2022   Antineoplastic chemotherapy induced anemia 08/01/2021   Intractable nausea and vomiting 03/01/2021   Generalized abdominal pain 03/01/2021   Elevated lactic acid level 03/01/2021   Port-A-Cath in place 11/21/2020   Adenocarcinoma of right lung, stage 4 ([place]) 10/30/2020   Encounter for antineoplastic immunotherapy 10/30/2020   Bone metastasis 10/01/2020   Abdominal pain 12/07/2018   Appendicitis 12/07/2018   Goals of care, counseling/discussion 10/13/2018   Encounter for antineoplastic chemotherapy 10/13/2018   S/P Right VATS with Right Upper Lobectomy, Lymph Node Sampling, Intercostal Nerve Block 09/14/2018   Pneumothorax after  biopsy 09/02/2018   Stage 1 mild COPD by GOLD classification ([place]) 07/30/2018   Syncope 07/23/2018   Antiplatelet or antithrombotic long-term use 07/14/2018   Nodule of upper lobe of right lung 07/14/2018   Multiple lung nodules on CT 07/14/2018   Nausea vomiting and diarrhea 10/30/2016   Hyponatremia 10/30/2016   Hypocalcemia 10/30/2016   Hypokalemia 04/14/2015   Hypomagnesemia 04/14/2015   Claudication ([place]) 04/13/2015   Essential hypertension 01/14/2014   Hyperlipidemia 01/14/2014   Thrombosed external hemorrhoids 12/07/2013   Chronic anticoagulation 01/20/2013   PVD (peripheral vascular disease) ([place]) 01/20/2013   Coronary atherosclerosis of native coronary artery 01/20/2013   GERD (gastroesophageal reflux disease) 01/20/2013   Benign esophageal stricture 01/20/2013   DENTAL CARIES 07/20/2010    PCP: [name], NP   REFERRING PROVIDER: [name], NP   REFERRING DIAG: R26.81 (ICD-10-CM) - Gait instability   THERAPY DIAG:  History of falling - Plan: PT plan of care cert/re-cert  Difficulty in walking, not elsewhere classified - Plan: PT plan of care cert/re-cert  Muscle weakness (generalized) - Plan: PT plan of care cert/re-cert  Cramp and spasm - Plan: PT  plan of care cert/re-cert  Rationale for Evaluation and Treatment Rehabilitation  ONSET DATE: 04/30/2022   SUBJECTIVE:   SUBJECTIVE STATEMENT: Patient with active cancer but stable over past year.  He reports he has lost 1/3 of his body weight and has been anemic.  He fatigues quite easily.  He can only tolerate a couple of minutes of activity and uses a wheelchair.  He has fallen x 2 over the past 6 months, both times due to light headedness and has been told he has orthostatic hypotension.  He must have wheelchair to leave home but is able to walk around his home without assistive device.  He is most concerned about taking a shower.  His home is not conducive to his limitations including a narrow shower  stall.  He has a shower chair but he feels it is too low and makes him feel crowded in the stall.  He had home health set up including PT and OT, but did not like that they could not give him a schedule and decided to move forward with outpatient even though getting out is very taxing on him and his wife.  He hopes to be able to stand and do things for himself without need for his wife to have to be right with him all of the time.    PERTINENT HISTORY: -Likely due to through metastatic cancer.  Discussed having palliative care come into the home for evaluation but he refused at this time.   Cancer has been stable for 2 years.  He has stopped chemo for one year. Scans and blood work every 3 months.  Only mets currently are in left shoulder.   PAIN:  Are you having pain? Yes: NPRS scale: 0/10 Pain location: none currently Pain description: na Aggravating factors: na Relieving factors: na  PRECAUTIONS: Fall  WEIGHT BEARING RESTRICTIONS No  FALLS:  Has patient fallen in last 6 months? Yes. Number of falls 2  LIVING ENVIRONMENT: Lives with: lives with their spouse Lives in: House/apartment Stairs: Yes: Internal: 0 steps; none and External: 4 steps; none Has following equipment at home: Wheelchair (manual) and does not use anything when not in wheelchair  OCCUPATION: disabled  PLOF: Independent with basic ADLs and Needs assistance with transfers  PATIENT GOALS to be able to function normally without needing assistance   OBJECTIVE:   DIAGNOSTIC FINDINGS: na   COGNITION:  Overall cognitive status: Within functional limits for tasks assessed     SENSATION: WFL   POSTURE: rounded shoulders and forward head   LOWER EXTREMITY ROM:  WFL  LOWER EXTREMITY MMT:  Deferred due to possible bone metastises  FUNCTIONAL TESTS:  5 times sit to stand: 27.23 Timed up and go (TUG): 13.40  GAIT: Distance walked: 30 Assistive device utilized:  refuses a.d. needs cga Level of  assistance: CGA Comments: slow, unsteady    TODAY'S TREATMENT: Initial eval completed, educated on proper DME needed in the home for safety.  Discussed grab bars in shower, need for rollator walker.    PATIENT EDUCATION:  Education details: Animal nutritionist, DME needs Person educated: Patient and Spouse Education method: Customer service manager Education comprehension: verbalized understanding and needs further education   HOME EXERCISE PROGRAM: Begin next visit  ASSESSMENT:  CLINICAL IMPRESSION: Patient is a 66 y.o. male who was seen today for physical therapy evaluation and treatment for unstable gait.  He presents with significant weakness and poor activity tolerance.  He is high fall risk and refuses assistive device for  home ambulation despite having fallen x 2 at home.  He would benefit from skilled PT to educate on home safety and fall prevention along with strengthening, balance training and gait training.    OBJECTIVE IMPAIRMENTS Abnormal gait, cardiopulmonary status limiting activity, decreased activity tolerance, decreased balance, decreased coordination, decreased endurance, decreased mobility, difficulty walking, decreased ROM, decreased strength, decreased safety awareness, increased muscle spasms, impaired flexibility, and pain.   ACTIVITY LIMITATIONS carrying, lifting, bending, standing, squatting, sleeping, stairs, transfers, bed mobility, and dressing  PARTICIPATION LIMITATIONS: meal prep, cleaning, laundry, driving, shopping, and community activity  [place] and 1 comorbidity: protein deficiency  are also affecting patient's functional outcome.   REHAB POTENTIAL: Good  CLINICAL DECISION MAKING: Stable/uncomplicated  EVALUATION COMPLEXITY: Low   GOALS: Goals reviewed with patient? Yes  SHORT TERM GOALS: Target date: 06/05/2022  Patient will be independent with initial HEP  Baseline: Goal status: INITIAL  2.  Patient to be able to stand  for at least 2 min to do activity Baseline:  Goal status: INITIAL  3.  Patient to be able to walk 50 feet with proper assistive device safely with stand by assist Baseline:  Goal status: INITIAL   LONG TERM GOALS: Target date: 07/03/2022   Patient to be independent with advanced HEP  Baseline:  Goal status: INITIAL  2.  Patient to be able to stand for up to 15 min to be able to take shower standing safely Baseline:  Goal status: INITIAL  3.  Patient to be able to walk 300 feet with proper assistive device safely and independently Baseline: 10 feet with cga Goal status: INITIAL  4.  Patient scores on 5 times sit to stand and TUG to improve by 5-10 sec. Baseline:  Goal status: INITIAL  5.  Patient to be able to eat normal 3 meals per day to improve iron levels and energy Baseline:  Goal status: INITIAL  6.  Patient to obtain proper DME and assistive device and to report no falls during episode of treatment Baseline:  Goal status: INITIAL   PLAN: PT FREQUENCY: 1-2x/week  PT DURATION: 8 weeks  PLANNED INTERVENTIONS: Therapeutic exercises, Therapeutic activity, Neuromuscular re-education, Balance training, Gait training, Patient/Family education, Self Care, Joint mobilization, Stair training, DME instructions, Aquatic Therapy, Dry Needling, Cryotherapy, Moist heat, Taping, Manual therapy, and Re-evaluation  PLAN FOR NEXT SESSION: NuStep with close monitoring, supine and seated LE strengthening   Dari Carpenito B. Syble Picco, PT 05/08/22 11:06 PM   [facility] [address] [place], [place] [address] Phone # [phone] Fax [phone]

## 2022-05-15 ENCOUNTER — Encounter: Payer: Self-pay | Admitting: Physical Therapy

## 2022-05-15 ENCOUNTER — Inpatient Hospital Stay: Payer: BC Managed Care – PPO

## 2022-05-15 ENCOUNTER — Ambulatory Visit: Payer: BC Managed Care – PPO | Admitting: Physical Therapy

## 2022-05-15 DIAGNOSIS — R262 Difficulty in walking, not elsewhere classified: Secondary | ICD-10-CM | POA: Diagnosis not present

## 2022-05-15 DIAGNOSIS — R2681 Unsteadiness on feet: Secondary | ICD-10-CM | POA: Diagnosis not present

## 2022-05-15 DIAGNOSIS — Z9181 History of falling: Secondary | ICD-10-CM

## 2022-05-15 DIAGNOSIS — R252 Cramp and spasm: Secondary | ICD-10-CM | POA: Diagnosis not present

## 2022-05-15 DIAGNOSIS — M6281 Muscle weakness (generalized): Secondary | ICD-10-CM

## 2022-05-15 NOTE — Therapy (Signed)
OUTPATIENT PHYSICAL THERAPY TREATMENT NOTE   Patient Name: Sean Rose MRN: [id number] DOB:1956/03/27, 66 y.o., male Today's Date: 05/15/2022  PCP: [name], NP  REFERRING PROVIDER: [name], NP   END OF SESSION:   PT End of Session - 05/15/22 1106     Visit Number 2    Authorization Type BLUE CROSS BLUE [employer]IELD    PT Start Time 1105    PT Stop Time 1145    PT Time Calculation (min) 40 min    Activity Tolerance Patient limited by fatigue;Patient limited by lethargy    Behavior During Therapy [facility] for tasks assessed/performed             Past Medical History:  Diagnosis Date   Allergy    Anemia    Anginal pain ([place])    Blood transfusion without reported diagnosis    CAD (coronary artery disease)    CAP (community acquired pneumonia) 09/2016   COPD (chronic obstructive pulmonary disease) ([place])    Emphysema of lung ([place])    GERD (gastroesophageal reflux disease)    Heart murmur    "I was told I had one when I was a kid"   Hemorrhoids    History of anal fissures    "no surgeries" (10/30/2016)   Hyperlipidemia    Hypertension    lung ca dx'd 08/2018   with mets to bones in arms   Myocardial infarction [facility])    Peripheral arterial disease ([place])    status post right common iliac artery stenting back in 2007   Seasonal allergies    Tobacco abuse    Past Surgical History:  Procedure Laterality Date   ANKLE SURGERY Left    "rebuilt it"   ANTERIOR CRUCIATE LIGAMENT REPAIR Right    CARDIAC CATHETERIZATION  09/23/2008   Continued medical therapy - may need GI evaluation in addition.   CARDIAC CATHETERIZATION  10/28/2007   Medical therapy recommended.   CARDIAC CATHETERIZATION  11/18/2006   In-stent restenosis RCA  (50% distal edge, 80% segmental mid, and 50-60% segmental proximal). Successful cutting balloon atherectomy using a [id number] cutting balloon. 3 inflations with atherectomy performed on mid and proximal portions resulting in reduction of 80% mid  in-stent restenosis to less than 20% residual and 50-60% segmental proximal to less than 20% residual without dissection.   CARDIAC CATHETERIZATION  02/26/2006   Severe stenosis in RCA. Stenting performed using IVUS. 3.5x20 Maverick balloon deployed at [employer]. Distal stent-a 4x28 Liberte stent-deployed 12atm 48sec, 12atm 31sec, 4atm 19sec. Mid stent-a 4x28 Liberte stent-deployed 14atm 45sec, 14atm 60sec, 14atm 44sec. Proximal stent-4x8 Liberte- 14atm 45sec,14atm 47sec, 16atm 43sec. Severely diseased segment then appeared TIMI-3 flow.   CARDIOVASCULAR STRESS TEST  11/17/2012   No significant ECG changes. Septal perfusion defect is new when complared to study from 2010. Abnormal myocardial perfusion imaging with a basal to mid perfusion suggestive of previous MI.   CAROTID DOPPLER  08/09/2011   Bilateral Bulb/Proximal ICA - demonstrated a mild amount of fibrous plaque without evidence of significant diameter reduction reduction or other vascular abnormality.   CHEST TUBE INSERTION Right 09/02/2018   Procedure: Chest Tube Insertion;  Surgeon: Garner Nash, DO;  Location: [place];  Service: Thoracic;  Laterality: Right;   COLONOSCOPY     2003, 2014   CORONARY ANGIOPLASTY WITH STENT PLACEMENT     FEMORAL ARTERY STENT     INGUINAL HERNIA REPAIR Right    IR IMAGING GUIDED PORT INSERTION  11/13/2020   KNEE ARTHROSCOPY Right "  multiple"   LAPAROSCOPIC APPENDECTOMY N/A 12/07/2018   Procedure: APPENDECTOMY LAPAROSCOPIC;  Surgeon: Coralie Keens, MD;  Location: [facility];  Service: General;  Laterality: N/A;   LOWER EXTREMITY ARTERIAL DOPPLER  01/31/2011   Bilateral ABIs-normal values with no suggestion of arterial insuff to the lower extremities at rest. Right CIA stent-mild amount of nonhemodynamically significant plaque is noted throughout   [place]   "bone-eating tumor"   PERCUTANEOUS STENT INTERVENTION  04/04/2006 & 04/13/2015   a. Right common iliac artery with an 8.0x18 mm Herculink stent  deployed at 12 atm. Stenosis was reduced from 80% to 0% with brisk flow. b. I-cast stenting to left common iliac artery   PERIPHERAL VASCULAR CATHETERIZATION N/A 04/13/2015   Procedure: Lower Extremity Angiography;  Surgeon: Lorretta Harp, MD; L-oCIA 75%, 40-50% L-EIA, R-CIA stent patent, s/p 8 mm x 38 mm ICast covered stent>>0% stenosis in [place] Right    TRANSTHORACIC ECHOCARDIOGRAM  11/26/2012   EF not noted. Aortic valve-sclerosis without stenosis, no regurgiation.    UPPER GASTROINTESTINAL ENDOSCOPY     US CAROTID DOPPLER BILATERAL ([place] HX)  08/09/2011   Bilateral Bulb/Proximal ICAa demonstrated a mild amount of fibrous plaque without evidence of significant diameter reduction or any other vascular abnormality.   VIDEO ASSISTED THORACOSCOPY (VATS)/ LOBECTOMY Right 09/14/2018   Procedure: RIGHT VIDEO ASSISTED THORACOSCOPY (VATS)/ RIGHT UPPER LOBECTOMY;  Surgeon: Melrose Nakayama, MD;  Location: [place];  Service: Thoracic;  Laterality: Right;   VIDEO BRONCHOSCOPY WITH ENDOBRONCHIAL NAVIGATION N/A 09/02/2018   Procedure: VIDEO BRONCHOSCOPY WITH ENDOBRONCHIAL NAVIGATION;  Surgeon: Garner Nash, DO;  Location: [place];  Service: Thoracic;  Laterality: N/A;   Patient Active Problem List   Diagnosis Date Noted   CKD (chronic kidney disease) stage 3, GFR 30-59 ml/min ([place]) 03/14/2022   Antineoplastic chemotherapy induced anemia 08/01/2021   Intractable nausea and vomiting 03/01/2021   Generalized abdominal pain 03/01/2021   Elevated lactic acid level 03/01/2021   Port-A-Cath in place 11/21/2020   Adenocarcinoma of right lung, stage 4 ([place]) 10/30/2020   Encounter for antineoplastic immunotherapy 10/30/2020   Bone metastasis 10/01/2020   Abdominal pain 12/07/2018   Appendicitis 12/07/2018   Goals of care, counseling/discussion 10/13/2018   Encounter for antineoplastic chemotherapy 10/13/2018   S/P Right VATS with Right Upper Lobectomy,  Lymph Node Sampling, Intercostal Nerve Block 09/14/2018   Pneumothorax after biopsy 09/02/2018   Stage 1 mild COPD by GOLD classification ([place]) 07/30/2018   Syncope 07/23/2018   Antiplatelet or antithrombotic long-term use 07/14/2018   Nodule of upper lobe of right lung 07/14/2018   Multiple lung nodules on CT 07/14/2018   Nausea vomiting and diarrhea 10/30/2016   Hyponatremia 10/30/2016   Hypocalcemia 10/30/2016   Hypokalemia 04/14/2015   Hypomagnesemia 04/14/2015   Claudication ([place]) 04/13/2015   Essential hypertension 01/14/2014   Hyperlipidemia 01/14/2014   Thrombosed external hemorrhoids 12/07/2013   Chronic anticoagulation 01/20/2013   PVD (peripheral vascular disease) ([place]) 01/20/2013   Coronary atherosclerosis of native coronary artery 01/20/2013   GERD (gastroesophageal reflux disease) 01/20/2013   Benign esophageal stricture 01/20/2013   DENTAL CARIES 07/20/2010    REFERRING DIAG: R26.81 (ICD-10-CM) - Gait instability (Gait instability, muscle atrophy, severe protein calorie malnutrition from adenocarcinoma)  THERAPY DIAG:  History of falling  Difficulty in walking, not elsewhere classified  Muscle weakness (generalized)  Cramp and spasm  Rationale for Evaluation and Treatment Rehabilitation  PERTINENT HISTORY: Likely due to  through metastatic cancer.  Discussed having palliative care come into the home for evaluation but he refused at this time.   Cancer has been stable for 2 years.  He has stopped chemo for one year. Scans and blood work every 3 months.  Only mets currently are in left shoulder   PRECAUTIONS: fall  Has patient fallen in last 6 months? Yes. Number of falls 2   LIVING ENVIRONMENT: Lives with: lives with their spouse Lives in: House/apartment Stairs: Yes: Internal: 0 steps; none and External: 4 steps; none Has following equipment at home: Wheelchair (manual) and does not use anything when not in wheelchair   OCCUPATION: disabled   PLOF:  Independent with basic ADLs and Needs assistance with transfers   PATIENT GOALS to be able to function normally without needing assistance  SUBJECTIVE:                                                                                                                                                                                      SUBJECTIVE STATEMENT:  I am out of sorts today. I don't feel well.  I am shaky and wobbly.  I didn't sleep well.  I didn't take my BP today but probably should have.     Are you having pain? Yes: NPRS scale: 0/10 Pain location: none currently Pain description: na Aggravating factors: na Relieving factors: na   OBJECTIVE: (objective measures completed at initial evaluation unless otherwise dated)  DIAGNOSTIC FINDINGS: na     COGNITION:           Overall cognitive status: Within functional limits for tasks assessed                          SENSATION: WFL     POSTURE: rounded shoulders and forward head     LOWER EXTREMITY ROM:   WFL   LOWER EXTREMITY MMT:   Deferred due to possible bone metastises   FUNCTIONAL TESTS:  5 times sit to stand: 27.23 Timed up and go (TUG): 13.40   GAIT: Distance walked: 30 Assistive device utilized:  refuses a.d. needs cga Level of assistance: CGA Comments: slow, unsteady       TODAY'S TREATMENT: 05/15/22: [facility] to chair with arms with close supervision BP: 133/95 Seated alt LE march x 20 Seated LAQ 10x5"  Seated heel/toe raises x 20 Scapular squeezes in tall posture 10x3" Sit to stand Gait with gait belt and CGA from barre to low mat table, positioned elevated on wedge with two pillows behind neck: Heel slides x 10 bil Hip abd bil light green loop band x 30  SLR x 10 each LE Quad set with large pool noodle under knees 10x5" bil Glut sets 10x5" HEP issued which Pt can do in recliner at home   Eval: Initial eval completed, educated on proper DME needed in the home for safety.  Discussed  grab bars in shower, need for rollator walker.      PATIENT EDUCATION:  Education details: Animal nutritionist, DME needs Person educated: Patient and Spouse Education method: Customer service manager Education comprehension: verbalized understanding and needs further education     HOME EXERCISE PROGRAM: Access Code: [email or web address] URL: [email or web address] Date: 05/15/2022 Prepared by: Venetia Night Irving Lubbers  Exercises - Supine Quad Set  - 3 x daily - 7 x weekly - 1 sets - 10 reps - 5 hold - Supine Gluteal Sets  - 3 x daily - 7 x weekly - 1 sets - 10 reps - 5 hold - Supine Straight Leg Raises  - 3 x daily - 7 x weekly - 1 sets - 10 reps - Hooklying Isometric Clamshell  - 3 x daily - 7 x weekly - 1 sets - 20 reps   ASSESSMENT:   CLINICAL IMPRESSION: Pt arrived feeling unwell, reporting feeling wobbly and shaky.  His BP was 133/95 which he was surprised by given symptoms.  He hadn't slept well.  Pt has neck pain when sitting upright in chair so after some light seated therex we positioned supine on wedge.  He spends most of his time in a recliner so issued HEP which he can do from there.  Pt did state he felt more wobbly after sit to stands today and that he has passed out 2x in past and hit head/gone to ER. Will continue to be closely monitored for safety.   OBJECTIVE IMPAIRMENTS Abnormal gait, cardiopulmonary status limiting activity, decreased activity tolerance, decreased balance, decreased coordination, decreased endurance, decreased mobility, difficulty walking, decreased ROM, decreased strength, decreased safety awareness, increased muscle spasms, impaired flexibility, and pain.    ACTIVITY LIMITATIONS carrying, lifting, bending, standing, squatting, sleeping, stairs, transfers, bed mobility, and dressing   PARTICIPATION LIMITATIONS: meal prep, cleaning, laundry, driving, shopping, and community activity   [place] and 1 comorbidity: protein deficiency  are also  affecting patient's functional outcome.    REHAB POTENTIAL: Good   CLINICAL DECISION MAKING: Stable/uncomplicated   EVALUATION COMPLEXITY: Low     GOALS: Goals reviewed with patient? Yes   SHORT TERM GOALS: Target date: 06/05/2022  Patient will be independent with initial HEP  Baseline: Goal status: ongoing   2.  Patient to be able to stand for at least 2 min to do activity Baseline:  Goal status: INITIAL   3.  Patient to be able to walk 50 feet with proper assistive device safely with stand by assist Baseline:  Goal status: INITIAL     LONG TERM GOALS: Target date: 07/03/2022    Patient to be independent with advanced HEP  Baseline:  Goal status: INITIAL   2.  Patient to be able to stand for up to 15 min to be able to take shower standing safely Baseline:  Goal status: INITIAL   3.  Patient to be able to walk 300 feet with proper assistive device safely and independently Baseline: 10 feet with cga Goal status: INITIAL   4.  Patient scores on 5 times sit to stand and TUG to improve by 5-10 sec. Baseline:  Goal status: INITIAL   5.  Patient to be able to eat normal 3 meals per day  to improve iron levels and energy Baseline:  Goal status: INITIAL   6.  Patient to obtain proper DME and assistive device and to report no falls during episode of treatment Baseline:  Goal status: INITIAL     PLAN: PT FREQUENCY: 1-2x/week   PT DURATION: 8 weeks   PLANNED INTERVENTIONS: Therapeutic exercises, Therapeutic activity, Neuromuscular re-education, Balance training, Gait training, Patient/Family education, Self Care, Joint mobilization, Stair training, DME instructions, Aquatic Therapy, Dry Needling, Cryotherapy, Moist heat, Taping, Manual therapy, and Re-evaluation   PLAN FOR NEXT SESSION: NuStep with close monitoring, supine and seated LE strengthening, does well supine propped with wedge and 2 pillows under neck      Yossi Hinchman, PT 05/15/22 1:30 PM

## 2022-05-17 ENCOUNTER — Encounter: Payer: Self-pay | Admitting: Physical Therapy

## 2022-05-17 ENCOUNTER — Ambulatory Visit: Payer: BC Managed Care – PPO | Admitting: Physical Therapy

## 2022-05-17 DIAGNOSIS — Z9181 History of falling: Secondary | ICD-10-CM | POA: Diagnosis not present

## 2022-05-17 DIAGNOSIS — R262 Difficulty in walking, not elsewhere classified: Secondary | ICD-10-CM

## 2022-05-17 DIAGNOSIS — R252 Cramp and spasm: Secondary | ICD-10-CM

## 2022-05-17 DIAGNOSIS — R2681 Unsteadiness on feet: Secondary | ICD-10-CM | POA: Diagnosis not present

## 2022-05-17 DIAGNOSIS — M6281 Muscle weakness (generalized): Secondary | ICD-10-CM

## 2022-05-17 NOTE — Therapy (Signed)
OUTPATIENT PHYSICAL THERAPY TREATMENT NOTE   Patient Name: Sean Rose MRN: [id number] DOB:Jan 21, 1956, 66 y.o., male Today's Date: 05/17/2022  PCP: [name], NP  REFERRING PROVIDER: [name], NP   END OF SESSION:   PT End of Session - 05/17/22 1016     Visit Number 3    Authorization Type BLUE [employer]    PT Start Time 1016    PT Stop Time 1055    PT Time Calculation (min) 39 min    Activity Tolerance Patient tolerated treatment well    Behavior During Therapy WFL for tasks assessed/performed              Past Medical History:  Diagnosis Date   Allergy    Anemia    Anginal pain (HCC)    Blood transfusion without reported diagnosis    CAD (coronary artery disease)    CAP (community acquired pneumonia) 09/2016   COPD (chronic obstructive pulmonary disease) (HCC)    Emphysema of lung (HCC)    GERD (gastroesophageal reflux disease)    Heart murmur    "I was told I had one when I was a kid"   Hemorrhoids    History of anal fissures    "no surgeries" (10/30/2016)   Hyperlipidemia    Hypertension    lung ca dx'd 08/2018   with mets to bones in arms   Myocardial infarction [facility])    Peripheral arterial disease (HCC)    status post right common iliac artery stenting back in 2007   Seasonal allergies    Tobacco abuse    Past Surgical History:  Procedure Laterality Date   ANKLE SURGERY Left    "rebuilt it"   ANTERIOR CRUCIATE LIGAMENT REPAIR Right    CARDIAC CATHETERIZATION  09/23/2008   Continued medical therapy - may need GI evaluation in addition.   CARDIAC CATHETERIZATION  10/28/2007   Medical therapy recommended.   CARDIAC CATHETERIZATION  11/18/2006   In-stent restenosis RCA  (50% distal edge, 80% segmental mid, and 50-60% segmental proximal). Successful cutting balloon atherectomy using a [id number] cutting balloon. 3 inflations with atherectomy performed on mid and proximal portions resulting in reduction of 80% mid in-stent restenosis to less  than 20% residual and 50-60% segmental proximal to less than 20% residual without dissection.   CARDIAC CATHETERIZATION  02/26/2006   Severe stenosis in RCA. Stenting performed using IVUS. 3.5x20 Maverick balloon deployed at [employer]. Distal stent-a 4x28 Liberte stent-deployed 12atm 48sec, 12atm 31sec, 4atm 19sec. Mid stent-a 4x28 Liberte stent-deployed 14atm 45sec, 14atm 60sec, 14atm 44sec. Proximal stent-4x8 Liberte- 14atm 45sec,14atm 47sec, 16atm 43sec. Severely diseased segment then appeared TIMI-3 flow.   CARDIOVASCULAR STRESS TEST  11/17/2012   No significant ECG changes. Septal perfusion defect is new when complared to study from 2010. Abnormal myocardial perfusion imaging with a basal to mid perfusion suggestive of previous MI.   CAROTID DOPPLER  08/09/2011   Bilateral Bulb/Proximal ICA - demonstrated a mild amount of fibrous plaque without evidence of significant diameter reduction reduction or other vascular abnormality.   CHEST TUBE INSERTION Right 09/02/2018   Procedure: Chest Tube Insertion;  Surgeon: Josephine Igo, DO;  Location: [facility] OR;  Service: Thoracic;  Laterality: Right;   COLONOSCOPY     2003, 2014   CORONARY ANGIOPLASTY WITH STENT PLACEMENT     FEMORAL ARTERY STENT     INGUINAL HERNIA REPAIR Right    IR IMAGING GUIDED PORT INSERTION  11/13/2020   KNEE ARTHROSCOPY Right "multiple"  LAPAROSCOPIC APPENDECTOMY N/A 12/07/2018   Procedure: APPENDECTOMY LAPAROSCOPIC;  Surgeon: Coralie Keens, MD;  Location: [facility] OR[facility];  Service: General;  Laterality: N/A;   LOWER EXTREMITY ARTERIAL DOPPLER  01/31/2011   Bilateral ABIs-normal values with no suggestion of arterial insuff to the lower extremities at rest. Right CIA stent-mild amount of nonhemodynamically significant plaque is noted throughout   [place]   "bone-eating tumor"   PERCUTANEOUS STENT INTERVENTION  04/04/2006 & 04/13/2015   a. Right common iliac artery with an 8.0x18 mm Herculink stent deployed at 12 atm. Stenosis  was reduced from 80% to 0% with brisk flow. b. I-cast stenting to left common iliac artery   PERIPHERAL VASCULAR CATHETERIZATION N/A 04/13/2015   Procedure: Lower Extremity Angiography;  Surgeon: Lorretta Harp, MD; L-oCIA 75%, 40-50% L-EIA, R-CIA stent patent, s/p 8 mm x 38 mm ICast covered stent>>0% stenosis in [place] Right    TRANSTHORACIC ECHOCARDIOGRAM  11/26/2012   EF not noted. Aortic valve-sclerosis without stenosis, no regurgiation.    UPPER GASTROINTESTINAL ENDOSCOPY     US CAROTID DOPPLER BILATERAL ([facility])  08/09/2011   Bilateral Bulb/Proximal ICAa demonstrated a mild amount of fibrous plaque without evidence of significant diameter reduction or any other vascular abnormality.   VIDEO ASSISTED THORACOSCOPY (VATS)/ LOBECTOMY Right 09/14/2018   Procedure: RIGHT VIDEO ASSISTED THORACOSCOPY (VATS)/ RIGHT UPPER LOBECTOMY;  Surgeon: Melrose Nakayama, MD;  Location: [facility];  Service: Thoracic;  Laterality: Right;   VIDEO BRONCHOSCOPY WITH ENDOBRONCHIAL NAVIGATION N/A 09/02/2018   Procedure: VIDEO BRONCHOSCOPY WITH ENDOBRONCHIAL NAVIGATION;  Surgeon: Garner Nash, DO;  Location: [facility];  Service: Thoracic;  Laterality: N/A;   Patient Active Problem List   Diagnosis Date Noted   CKD (chronic kidney disease) stage 3, GFR 30-59 ml/min ([place]) 03/14/2022   Antineoplastic chemotherapy induced anemia 08/01/2021   Intractable nausea and vomiting 03/01/2021   Generalized abdominal pain 03/01/2021   Elevated lactic acid level 03/01/2021   Port-A-Cath in place 11/21/2020   Adenocarcinoma of right lung, stage 4 ([name]) 10/30/2020   Encounter for antineoplastic immunotherapy 10/30/2020   Bone metastasis 10/01/2020   Abdominal pain 12/07/2018   Appendicitis 12/07/2018   Goals of care, counseling/discussion 10/13/2018   Encounter for antineoplastic chemotherapy 10/13/2018   S/P Right VATS with Right Upper Lobectomy, Lymph Node Sampling, Intercostal  Nerve Block 09/14/2018   Pneumothorax after biopsy 09/02/2018   Stage 1 mild COPD by GOLD classification ([place]) 07/30/2018   Syncope 07/23/2018   Antiplatelet or antithrombotic long-term use 07/14/2018   Nodule of upper lobe of right lung 07/14/2018   Multiple lung nodules on CT 07/14/2018   Nausea vomiting and diarrhea 10/30/2016   Hyponatremia 10/30/2016   Hypocalcemia 10/30/2016   Hypokalemia 04/14/2015   Hypomagnesemia 04/14/2015   Claudication ([place]) 04/13/2015   Essential hypertension 01/14/2014   Hyperlipidemia 01/14/2014   Thrombosed external hemorrhoids 12/07/2013   Chronic anticoagulation 01/20/2013   PVD (peripheral vascular disease) ([place]) 01/20/2013   Coronary atherosclerosis of native coronary artery 01/20/2013   GERD (gastroesophageal reflux disease) 01/20/2013   Benign esophageal stricture 01/20/2013   DENTAL CARIES 07/20/2010    REFERRING DIAG: R26.81 (ICD-10-CM) - Gait instability (Gait instability, muscle atrophy, severe protein calorie malnutrition from adenocarcinoma)  THERAPY DIAG:  History of falling  Difficulty in walking, not elsewhere classified  Muscle weakness (generalized)  Cramp and spasm  Rationale for Evaluation and Treatment Rehabilitation  PERTINENT HISTORY: Likely due to through metastatic cancer.  Discussed having palliative care come into the home for evaluation but he refused at this time.   Cancer has been stable for 2 years.  He has stopped chemo for one year. Scans and blood work every 3 months.  Only mets currently are in left shoulder   PRECAUTIONS: fall  Has patient fallen in last 6 months? Yes. Number of falls 2   LIVING ENVIRONMENT: Lives with: lives with their spouse Lives in: House/apartment Stairs: Yes: Internal: 0 steps; none and External: 4 steps; none Has following equipment at home: Wheelchair (manual) and does not use anything when not in wheelchair   OCCUPATION: disabled   PLOF: Independent with basic ADLs and  Needs assistance with transfers   PATIENT GOALS to be able to function normally without needing assistance  SUBJECTIVE:                                                                                                                                                                                      SUBJECTIVE STATEMENT:  Feel about the same, doing my exercises at home.     Are you having pain? Yes: NPRS scale: 0/10 Pain location: none currently Pain description: na Aggravating factors: na Relieving factors: na   OBJECTIVE: (objective measures completed at initial evaluation unless otherwise dated)  DIAGNOSTIC FINDINGS: na     COGNITION:           Overall cognitive status: Within functional limits for tasks assessed                          SENSATION: WFL     POSTURE: rounded shoulders and forward head     LOWER EXTREMITY ROM:   WFL   LOWER EXTREMITY MMT:   Deferred due to possible bone metastises   FUNCTIONAL TESTS:  5 times sit to stand: 27.23 Timed up and go (TUG): 13.40   GAIT: Distance walked: 30 Assistive device utilized:  refuses a.d. needs cga Level of assistance: CGA Comments: slow, unsteady       TODAY'S TREATMENT:  05/17/22: Seated toe raises 10x2 Seated fingers clasped lifting arms 10x Seated heel raises 10x2 Ball squeezes 10x Green loop seated clamshells 10x2 Standing at walker 2 min, light UE, SBA to montior, second round 2:30 at walker with SBA  Seated glute squeezes 10x 3 sec hold Spoke with wife and pt about doing some standing at the walker at home with his wife in SBA and beginning at 2 min if he felt ok with what we did today then progress slowly. Both agreed and understood.    05/15/22: Transfer from [facility] to chair with  arms with close supervision BP: 133/95 Seated alt LE march x 20 Seated LAQ 10x5"  Seated heel/toe raises x 20 Scapular squeezes in tall posture 10x3" Sit to stand Gait with gait belt and CGA from barre to low  mat table, positioned elevated on wedge with two pillows behind neck: Heel slides x 10 bil Hip abd bil light green loop band x 30 SLR x 10 each LE Quad set with large pool noodle under knees 10x5" bil Glut sets 10x5" HEP issued which Pt can do in recliner at home   Eval: Initial eval completed, educated on proper DME needed in the home for safety.  Discussed grab bars in shower, need for rollator walker.      PATIENT EDUCATION:  Education details: Animal nutritionist, DME needs Person educated: Patient and Spouse Education method: Customer service manager Education comprehension: verbalized understanding and needs further education     HOME EXERCISE PROGRAM: Access Code: [email or web address] URL: [email or web address] Date: 05/15/2022 Prepared by: Venetia Night Beuhring  Exercises - Supine Quad Set  - 3 x daily - 7 x weekly - 1 sets - 10 reps - 5 hold - Supine Gluteal Sets  - 3 x daily - 7 x weekly - 1 sets - 10 reps - 5 hold - Supine Straight Leg Raises  - 3 x daily - 7 x weekly - 1 sets - 10 reps - Hooklying Isometric Clamshell  - 3 x daily - 7 x weekly - 1 sets - 20 reps   ASSESSMENT:   CLINICAL IMPRESSION: Pt arrives pain but still has a "woozy" feeling about him. Pt was able to complete his seated exercises well. Pt was able to stand up at walker for 2 min then after a rest period he stood for an addition 2 min 30 sec. This time frame made pt out of breath and required extensive rest time.  Both STG are met.    OBJECTIVE IMPAIRMENTS Abnormal gait, cardiopulmonary status limiting activity, decreased activity tolerance, decreased balance, decreased coordination, decreased endurance, decreased mobility, difficulty walking, decreased ROM, decreased strength, decreased safety awareness, increased muscle spasms, impaired flexibility, and pain.    ACTIVITY LIMITATIONS carrying, lifting, bending, standing, squatting, sleeping, stairs, transfers, bed mobility, and dressing    PARTICIPATION LIMITATIONS: meal prep, cleaning, laundry, driving, shopping, and community activity   [place] and 1 comorbidity: protein deficiency  are also affecting patient's functional outcome.    REHAB POTENTIAL: Good   CLINICAL DECISION MAKING: Stable/uncomplicated   EVALUATION COMPLEXITY: Low     GOALS: Goals reviewed with patient? Yes   SHORT TERM GOALS: Target date: 06/05/2022  Patient will be independent with initial HEP  Baseline: Goal status: Goal met 05/17/22   2.  Patient to be able to stand for at least 2 min to do activity Baseline:  Goal status: Goal met 05/17/22   3.  Patient to be able to walk 50 feet with proper assistive device safely with stand by assist Baseline:  Goal status: INITIAL     LONG TERM GOALS: Target date: 07/03/2022    Patient to be independent with advanced HEP  Baseline:  Goal status: INITIAL   2.  Patient to be able to stand for up to 15 min to be able to take shower standing safely Baseline:  Goal status: INITIAL   3.  Patient to be able to walk 300 feet with proper assistive device safely and independently Baseline: 10 feet with cga Goal status: INITIAL   4.  Patient scores on  5 times sit to stand and TUG to improve by 5-10 sec. Baseline:  Goal status: INITIAL   5.  Patient to be able to eat normal 3 meals per day to improve iron levels and energy Baseline:  Goal status: INITIAL   6.  Patient to obtain proper DME and assistive device and to report no falls during episode of treatment Baseline:  Goal status: INITIAL     PLAN: PT FREQUENCY: 1-2x/week   PT DURATION: 8 weeks   PLANNED INTERVENTIONS: Therapeutic exercises, Therapeutic activity, Neuromuscular re-education, Balance training, Gait training, Patient/Family education, Self Care, Joint mobilization, Stair training, DME instructions, Aquatic Therapy, Dry Needling, Cryotherapy, Moist heat, Taping, Manual therapy, and Re-evaluation   PLAN FOR  NEXT SESSION: See how pt did at home with standing tolerance.      Myrene Galas, PTA 05/17/22 10:57 AM

## 2022-05-22 ENCOUNTER — Other Ambulatory Visit: Payer: Self-pay | Admitting: Cardiovascular Disease

## 2022-05-22 ENCOUNTER — Ambulatory Visit: Payer: BC Managed Care – PPO

## 2022-05-24 ENCOUNTER — Ambulatory Visit: Payer: BC Managed Care – PPO

## 2022-05-24 DIAGNOSIS — M6281 Muscle weakness (generalized): Secondary | ICD-10-CM | POA: Diagnosis not present

## 2022-05-24 DIAGNOSIS — Z9181 History of falling: Secondary | ICD-10-CM | POA: Diagnosis not present

## 2022-05-24 DIAGNOSIS — R2681 Unsteadiness on feet: Secondary | ICD-10-CM | POA: Diagnosis not present

## 2022-05-24 DIAGNOSIS — R262 Difficulty in walking, not elsewhere classified: Secondary | ICD-10-CM

## 2022-05-24 DIAGNOSIS — R252 Cramp and spasm: Secondary | ICD-10-CM

## 2022-05-24 NOTE — Therapy (Signed)
OUTPATIENT PHYSICAL THERAPY TREATMENT NOTE   Patient Name: Sean Rose MRN: [id number] DOB:19-Nov-1955, 66 y.o., male Today's Date: 05/24/2022  PCP: [name], NP  REFERRING PROVIDER: [name], NP   END OF SESSION:   PT End of Session - 05/24/22 0948     Visit Number 4    Authorization Type BL[employer]    PT Start Time [phone]    PT Stop Time 1017    PT Time Calculation (min) 39 min    Activity Tolerance Patient tolerated treatment well    Behavior During Therapy WFL for tasks assessed/performed              Past Medical History:  Diagnosis Date   Allergy    Anemia    Anginal pain ([place])    Blood transfusion without reported diagnosis    CAD (coronary artery disease)    CAP (community acquired pneumonia) 09/2016   COPD (chronic obstructive pulmonary disease) ([place])    Emphysema of lung ([place])    GERD (gastroesophageal reflux disease)    Heart murmur    "I was told I had one when I was a kid"   Hemorrhoids    History of anal fissures    "no surgeries" (10/30/2016)   Hyperlipidemia    Hypertension    lung ca dx'd 08/2018   with mets to bones in arms   Myocardial infarction [facility])    Peripheral arterial disease ([place])    status post right common iliac artery stenting back in 2007   Seasonal allergies    Tobacco abuse    Past Surgical History:  Procedure Laterality Date   ANKLE SURGERY Left    "rebuilt it"   ANTERIOR CRUCIATE LIGAMENT REPAIR Right    CARDIAC CATHETERIZATION  09/23/2008   Continued medical therapy - may need GI evaluation in addition.   CARDIAC CATHETERIZATION  10/28/2007   Medical therapy recommended.   CARDIAC CATHETERIZATION  11/18/2006   In-stent restenosis RCA  (50% distal edge, 80% segmental mid, and 50-60% segmental proximal). Successful cutting balloon atherectomy using a [id number] cutting balloon. 3 inflations with atherectomy performed on mid and proximal portions resulting in reduction of 80% mid in-stent restenosis to less  than 20% residual and 50-60% segmental proximal to less than 20% residual without dissection.   CARDIAC CATHETERIZATION  02/26/2006   Severe stenosis in RCA. Stenting performed using IVUS. 3.5x20 Maverick balloon deployed at [employer]. Distal stent-a 4x28 Liberte stent-deployed 12atm 48sec, 12atm 31sec, 4atm 19sec. Mid stent-a 4x28 Liberte stent-deployed 14atm 45sec, 14atm 60sec, 14atm 44sec. Proximal stent-4x8 Liberte- 14atm 45sec,14atm 47sec, 16atm 43sec. Severely diseased segment then appeared TIMI-3 flow.   CARDIOVASCULAR STRESS TEST  11/17/2012   No significant ECG changes. Septal perfusion defect is new when complared to study from 2010. Abnormal myocardial perfusion imaging with a basal to mid perfusion suggestive of previous MI.   CAROTID DOPPLER  08/09/2011   Bilateral Bulb/Proximal ICA - demonstrated a mild amount of fibrous plaque without evidence of significant diameter reduction reduction or other vascular abnormality.   CHEST TUBE INSERTION Right 09/02/2018   Procedure: Chest Tube Insertion;  Surgeon: Garner Nash, DO;  Location: [place];  Service: Thoracic;  Laterality: Right;   COLONOSCOPY     2003, 2014   CORONARY ANGIOPLASTY WITH STENT PLACEMENT     FEMORAL ARTERY STENT     INGUINAL HERNIA REPAIR Right    IR IMAGING GUIDED PORT INSERTION  11/13/2020   KNEE ARTHROSCOPY Right "multiple"  LAPAROSCOPIC APPENDECTOMY N/A 12/07/2018   Procedure: APPENDECTOMY LAPAROSCOPIC;  Surgeon: Coralie Keens, MD;  Location: [facility];  Service: General;  Laterality: N/A;   LOWER EXTREMITY ARTERIAL DOPPLER  01/31/2011   Bilateral ABIs-normal values with no suggestion of arterial insuff to the lower extremities at rest. Right CIA stent-mild amount of nonhemodynamically significant plaque is noted throughout   [place]   "bone-eating tumor"   PERCUTANEOUS STENT INTERVENTION  04/04/2006 & 04/13/2015   a. Right common iliac artery with an 8.0x18 mm Herculink stent deployed at 12 atm. Stenosis  was reduced from 80% to 0% with brisk flow. b. I-cast stenting to left common iliac artery   PERIPHERAL VASCULAR CATHETERIZATION N/A 04/13/2015   Procedure: Lower Extremity Angiography;  Surgeon: Lorretta Harp, MD; L-oCIA 75%, 40-50% L-EIA, R-CIA stent patent, s/p 8 mm x 38 mm ICast covered stent>>0% stenosis in [place] Right    TRANSTHORACIC ECHOCARDIOGRAM  11/26/2012   EF not noted. Aortic valve-sclerosis without stenosis, no regurgiation.    UPPER GASTROINTESTINAL ENDOSCOPY     US CAROTID DOPPLER BILATERAL ([place] [facility])  08/09/2011   Bilateral Bulb/Proximal ICAa demonstrated a mild amount of fibrous plaque without evidence of significant diameter reduction or any other vascular abnormality.   VIDEO ASSISTED THORACOSCOPY (VATS)/ LOBECTOMY Right 09/14/2018   Procedure: RIGHT VIDEO ASSISTED THORACOSCOPY (VATS)/ RIGHT UPPER LOBECTOMY;  Surgeon: Melrose Nakayama, MD;  Location: [place];  Service: Thoracic;  Laterality: Right;   VIDEO BRONCHOSCOPY WITH ENDOBRONCHIAL NAVIGATION N/A 09/02/2018   Procedure: VIDEO BRONCHOSCOPY WITH ENDOBRONCHIAL NAVIGATION;  Surgeon: Garner Nash, DO;  Location: [place];  Service: Thoracic;  Laterality: N/A;   Patient Active Problem List   Diagnosis Date Noted   CKD (chronic kidney disease) stage 3, GFR 30-59 ml/min ([place]) 03/14/2022   Antineoplastic chemotherapy induced anemia 08/01/2021   Intractable nausea and vomiting 03/01/2021   Generalized abdominal pain 03/01/2021   Elevated lactic acid level 03/01/2021   Port-A-Cath in place 11/21/2020   Adenocarcinoma of right lung, stage 4 ([name]) 10/30/2020   Encounter for antineoplastic immunotherapy 10/30/2020   Bone metastasis 10/01/2020   Abdominal pain 12/07/2018   Appendicitis 12/07/2018   Goals of care, counseling/discussion 10/13/2018   Encounter for antineoplastic chemotherapy 10/13/2018   S/P Right VATS with Right Upper Lobectomy, Lymph Node Sampling, Intercostal  Nerve Block 09/14/2018   Pneumothorax after biopsy 09/02/2018   Stage 1 mild COPD by GOLD classification ([place]) 07/30/2018   Syncope 07/23/2018   Antiplatelet or antithrombotic long-term use 07/14/2018   Nodule of upper lobe of right lung 07/14/2018   Multiple lung nodules on CT 07/14/2018   Nausea vomiting and diarrhea 10/30/2016   Hyponatremia 10/30/2016   Hypocalcemia 10/30/2016   Hypokalemia 04/14/2015   Hypomagnesemia 04/14/2015   Claudication ([place]) 04/13/2015   Essential hypertension 01/14/2014   Hyperlipidemia 01/14/2014   Thrombosed external hemorrhoids 12/07/2013   Chronic anticoagulation 01/20/2013   PVD (peripheral vascular disease) ([place]) 01/20/2013   Coronary atherosclerosis of native coronary artery 01/20/2013   GERD (gastroesophageal reflux disease) 01/20/2013   Benign esophageal stricture 01/20/2013   DENTAL CARIES 07/20/2010    REFERRING DIAG: R26.81 (ICD-10-CM) - Gait instability (Gait instability, muscle atrophy, severe protein calorie malnutrition from adenocarcinoma)  THERAPY DIAG:  History of falling  Difficulty in walking, not elsewhere classified  Muscle weakness (generalized)  Cramp and spasm  Rationale for Evaluation and Treatment Rehabilitation  PERTINENT HISTORY: Likely due to through metastatic cancer.  Discussed having palliative care come into the home for evaluation but he refused at this time.   Cancer has been stable for 2 years.  He has stopped chemo for one year. Scans and blood work every 3 months.  Only mets currently are in left shoulder   PRECAUTIONS: fall  Has patient fallen in last 6 months? Yes. Number of falls 2   LIVING ENVIRONMENT: Lives with: lives with their spouse Lives in: House/apartment Stairs: Yes: Internal: 0 steps; none and External: 4 steps; none Has following equipment at home: Wheelchair (manual) and does not use anything when not in wheelchair   OCCUPATION: disabled   PLOF: Independent with basic ADLs and  Needs assistance with transfers   PATIENT GOALS to be able to function normally without needing assistance  SUBJECTIVE:                                                                                                                                                                                      SUBJECTIVE STATEMENT:  Feeling tired today.  Didn't sleep well.    Are you having pain? Yes: NPRS scale: 0/10 Pain location: none currently Pain description: na Aggravating factors: na Relieving factors: na   OBJECTIVE: (objective measures completed at initial evaluation unless otherwise dated)  DIAGNOSTIC FINDINGS: na     COGNITION:           Overall cognitive status: Within functional limits for tasks assessed                          SENSATION: WFL     POSTURE: rounded shoulders and forward head     LOWER EXTREMITY ROM:   WFL   LOWER EXTREMITY MMT:   Deferred due to possible bone metastises   FUNCTIONAL TESTS:  5 times sit to stand: 27.23 Timed up and go (TUG): 13.40   GAIT: Distance walked: 30 Assistive device utilized:  refuses a.d. needs cga Level of assistance: CGA Comments: slow, unsteady       TODAY'S TREATMENT: 05/24/22: NuStep x 6 min level 3 (rested every 2 min) Seated toe and heel raises 10x2 Seated fingers clasped lifting arms 10x2 Ball squeezes 10x Blue loop seated clamshells 10x2 Marching x 20 with 2# Seated LAQ with 2# x 10 each LE Standing at walker 3 min Ambulation/gait training with rw,  PT walking behind patient with wheelchair   05/17/22: Seated toe raises 10x2 Seated fingers clasped lifting arms 10x Seated heel raises 10x2 Ball squeezes 10x Green loop seated clamshells 10x2 Standing at walker 2 min, light UE, SBA to montior, second round 2:30 at walker with SBA  Seated glute squeezes 10x 3 sec hold Spoke with wife and pt about doing some standing at the walker at home with his wife in SBA and beginning at 2 min if he felt ok  with what we did today then progress slowly. Both agreed and understood.    05/15/22: Transfer from [facility] to chair with arms with close supervision BP: 133/95 Seated alt LE march x 20 Seated LAQ 10x5"  Seated heel/toe raises x 20 Scapular squeezes in tall posture 10x3" Sit to stand Gait with gait belt and CGA from barre to low mat table, positioned elevated on wedge with two pillows behind neck: Heel slides x 10 bil Hip abd bil light green loop band x 30 SLR x 10 each LE Quad set with large pool noodle under knees 10x5" bil Glut sets 10x5" HEP issued which Pt can do in recliner at home   Eval: Initial eval completed, educated on proper DME needed in the home for safety.  Discussed grab bars in shower, need for rollator walker.      PATIENT EDUCATION:  Education details: Animal nutritionist, DME needs Person educated: Patient and Spouse Education method: Customer service manager Education comprehension: verbalized understanding and needs further education     HOME EXERCISE PROGRAM: Access Code: [email or web address] URL: [email or web address] Date: 05/15/2022 Prepared by: Venetia Night Beuhring  Exercises - Supine Quad Set  - 3 x daily - 7 x weekly - 1 sets - 10 reps - 5 hold - Supine Gluteal Sets  - 3 x daily - 7 x weekly - 1 sets - 10 reps - 5 hold - Supine Straight Leg Raises  - 3 x daily - 7 x weekly - 1 sets - 10 reps - Hooklying Isometric Clamshell  - 3 x daily - 7 x weekly - 1 sets - 20 reps   ASSESSMENT:   CLINICAL IMPRESSION: Gwynneth Macleod was able to do all tasks with exception of his second set of LAQ with 2#.  He did have to rest on NuStep every 2 min but completed 6 min.  He tolerated increased standing time and was able to walk approx 75 feet with rw before needing to sit.  He was stable with gait and only needed cga.  He would benefit from continuing skilled PT for increasing LE strength, postural strengthening and endurance with goal of using walker vs wheelchair for mobility.      OBJECTIVE IMPAIRMENTS Abnormal gait, cardiopulmonary status limiting activity, decreased activity tolerance, decreased balance, decreased coordination, decreased endurance, decreased mobility, difficulty walking, decreased ROM, decreased strength, decreased safety awareness, increased muscle spasms, impaired flexibility, and pain.    ACTIVITY LIMITATIONS carrying, lifting, bending, standing, squatting, sleeping, stairs, transfers, bed mobility, and dressing   PARTICIPATION LIMITATIONS: meal prep, cleaning, laundry, driving, shopping, and community activity   [place] and 1 comorbidity: protein deficiency  are also affecting patient's functional outcome.    REHAB POTENTIAL: Good   CLINICAL DECISION MAKING: Stable/uncomplicated   EVALUATION COMPLEXITY: Low     GOALS: Goals reviewed with patient? Yes   SHORT TERM GOALS: Target date: 06/05/2022  Patient will be independent with initial HEP  Baseline: Goal status: Goal met 05/17/22   2.  Patient to be able to stand for at least 2 min to do activity Baseline:  Goal status: Goal met 05/17/22   3.  Patient to be able to walk 50 feet with proper assistive device safely with stand by assist Baseline:  Goal status: INITIAL     LONG TERM  GOALS: Target date: 07/03/2022    Patient to be independent with advanced HEP  Baseline:  Goal status: INITIAL   2.  Patient to be able to stand for up to 15 min to be able to take shower standing safely Baseline:  Goal status: INITIAL   3.  Patient to be able to walk 300 feet with proper assistive device safely and independently Baseline: 10 feet with cga Goal status: INITIAL   4.  Patient scores on 5 times sit to stand and TUG to improve by 5-10 sec. Baseline:  Goal status: INITIAL   5.  Patient to be able to eat normal 3 meals per day to improve iron levels and energy Baseline:  Goal status: INITIAL   6.  Patient to obtain proper DME and assistive device and to report  no falls during episode of treatment Baseline:  Goal status: INITIAL     PLAN: PT FREQUENCY: 1-2x/week   PT DURATION: 8 weeks   PLANNED INTERVENTIONS: Therapeutic exercises, Therapeutic activity, Neuromuscular re-education, Balance training, Gait training, Patient/Family education, Self Care, Joint mobilization, Stair training, DME instructions, Aquatic Therapy, Dry Needling, Cryotherapy, Moist heat, Taping, Manual therapy, and Re-evaluation   PLAN FOR NEXT SESSION: NuStep,  LE strengthening, standing tolerance, gait training with rw.      Anderson Malta B. Patirica Longshore, PT 05/24/22 12:06 PM  [facility] [address] [place], [place] [address] Phone # [phone] Fax [phone]

## 2022-05-29 ENCOUNTER — Ambulatory Visit: Payer: BC Managed Care – PPO

## 2022-05-30 ENCOUNTER — Ambulatory Visit (HOSPITAL_COMMUNITY)
Admission: RE | Admit: 2022-05-30 | Discharge: 2022-05-30 | Disposition: A | Discharge status: Home / Self Care | Payer: BC Managed Care – PPO | Source: Ambulatory Visit | Attending: Internal Medicine | Admitting: Internal Medicine

## 2022-05-30 ENCOUNTER — Inpatient Hospital Stay: Payer: BC Managed Care – PPO | Attending: Internal Medicine

## 2022-05-30 ENCOUNTER — Other Ambulatory Visit: Payer: Self-pay

## 2022-05-30 DIAGNOSIS — Z95828 Presence of other vascular implants and grafts: Secondary | ICD-10-CM

## 2022-05-30 DIAGNOSIS — K6389 Other specified diseases of intestine: Secondary | ICD-10-CM | POA: Diagnosis not present

## 2022-05-30 DIAGNOSIS — C3411 Malignant neoplasm of upper lobe, right bronchus or lung: Secondary | ICD-10-CM | POA: Diagnosis not present

## 2022-05-30 DIAGNOSIS — J90 Pleural effusion, not elsewhere classified: Secondary | ICD-10-CM | POA: Diagnosis not present

## 2022-05-30 DIAGNOSIS — C3490 Malignant neoplasm of unspecified part of unspecified bronchus or lung: Secondary | ICD-10-CM

## 2022-05-30 DIAGNOSIS — J939 Pneumothorax, unspecified: Secondary | ICD-10-CM | POA: Diagnosis not present

## 2022-05-30 DIAGNOSIS — J439 Emphysema, unspecified: Secondary | ICD-10-CM | POA: Diagnosis not present

## 2022-05-30 LAB — CBC WITH DIFFERENTIAL (CANCER CENTER ONLY)
Abs Immature Granulocytes: 0.03 10*3/uL (ref 0.00–0.07)
Basophils Absolute: 0 10*3/uL (ref 0.0–0.1)
Basophils Relative: 0 %
Eosinophils Absolute: 0.6 10*3/uL — ABNORMAL HIGH (ref 0.0–0.5)
Eosinophils Relative: 6 %
HCT: 28.1 % — ABNORMAL LOW (ref 39.0–52.0)
Hemoglobin: 10.2 g/dL — ABNORMAL LOW (ref 13.0–17.0)
Immature Granulocytes: 0 %
Lymphocytes Relative: 33 %
Lymphs Abs: 3 10*3/uL (ref 0.7–4.0)
MCH: 34.6 pg — ABNORMAL HIGH (ref 26.0–34.0)
MCHC: 36.3 g/dL — ABNORMAL HIGH (ref 30.0–36.0)
MCV: 95.3 fL (ref 80.0–100.0)
Monocytes Absolute: 0.8 10*3/uL (ref 0.1–1.0)
Monocytes Relative: 9 %
Neutro Abs: 4.7 10*3/uL (ref 1.7–7.7)
Neutrophils Relative %: 52 %
Platelet Count: 477 10*3/uL — ABNORMAL HIGH (ref 150–400)
RBC: 2.95 MIL/uL — ABNORMAL LOW (ref 4.22–5.81)
RDW: 14 % (ref 11.5–15.5)
WBC Count: 9.1 10*3/uL (ref 4.0–10.5)
nRBC: 0 % (ref 0.0–0.2)

## 2022-05-30 LAB — CMP (CANCER CENTER ONLY)
ALT: 8 U/L (ref 0–44)
AST: 20 U/L (ref 15–41)
Albumin: 3.3 g/dL — ABNORMAL LOW (ref 3.5–5.0)
Alkaline Phosphatase: 116 U/L (ref 38–126)
Anion gap: 7 (ref 5–15)
BUN: 12 mg/dL (ref 8–23)
CO2: 28 mmol/L (ref 22–32)
Calcium: 8.7 mg/dL — ABNORMAL LOW (ref 8.9–10.3)
Chloride: 91 mmol/L — ABNORMAL LOW (ref 98–111)
Creatinine: 1.43 mg/dL — ABNORMAL HIGH (ref 0.61–1.24)
GFR, Estimated: 54 mL/min — ABNORMAL LOW (ref >60)
Glucose, Bld: 103 mg/dL — ABNORMAL HIGH (ref 70–99)
Potassium: 3.8 mmol/L (ref 3.5–5.1)
Sodium: 126 mmol/L — ABNORMAL LOW (ref 135–145)
Total Bilirubin: 0.4 mg/dL (ref 0.3–1.2)
Total Protein: 7.2 g/dL (ref 6.5–8.1)

## 2022-05-30 MED ORDER — SODIUM CHLORIDE 0.9% FLUSH
10.0000 mL | Freq: Once | INTRAVENOUS | Status: AC
  Administered 2022-05-30: 10 mL

## 2022-05-31 ENCOUNTER — Ambulatory Visit: Payer: BC Managed Care – PPO | Attending: Adult Health | Admitting: Physical Therapy

## 2022-05-31 ENCOUNTER — Encounter: Payer: Self-pay | Admitting: Physical Therapy

## 2022-05-31 DIAGNOSIS — R252 Cramp and spasm: Secondary | ICD-10-CM | POA: Insufficient documentation

## 2022-05-31 DIAGNOSIS — Z9181 History of falling: Secondary | ICD-10-CM | POA: Diagnosis not present

## 2022-05-31 DIAGNOSIS — M6281 Muscle weakness (generalized): Secondary | ICD-10-CM | POA: Diagnosis not present

## 2022-05-31 DIAGNOSIS — R262 Difficulty in walking, not elsewhere classified: Secondary | ICD-10-CM | POA: Diagnosis not present

## 2022-05-31 NOTE — Therapy (Addendum)
OUTPATIENT PHYSICAL THERAPY TREATMENT NOTE   Patient Name: Sean Rose MRN: [id number] DOB:[birth year], 66 y.o., male Today's Date: 05/31/2022  PCP: [name], NP  REFERRING PROVIDER: [name], NP   END OF SESSION:   PT End of Session - 05/31/22 1102     Visit Number 5    Authorization Type BLUE [employer]    PT Start Time 1059    PT Stop Time 4481    PT Time Calculation (min) 46 min    Activity Tolerance Patient tolerated treatment well    Behavior During Therapy WFL for tasks assessed/performed               Past Medical History:  Diagnosis Date   Allergy    Anemia    Anginal pain ([place])    Blood transfusion without reported diagnosis    CAD (coronary artery disease)    CAP (community acquired pneumonia) 09/2016   COPD (chronic obstructive pulmonary disease) ([place])    Emphysema of lung ([place])    GERD (gastroesophageal reflux disease)    Heart murmur    "I was told I had one when I was a kid"   Hemorrhoids    History of anal fissures    "no surgeries" (10/30/2016)   Hyperlipidemia    Hypertension    lung ca dx'd 08/2018   with mets to bones in arms   Myocardial infarction [facility])    Peripheral arterial disease ([place])    status post right common iliac artery stenting back in 2007   Seasonal allergies    Tobacco abuse    Past Surgical History:  Procedure Laterality Date   ANKLE SURGERY Left    "rebuilt it"   ANTERIOR CRUCIATE LIGAMENT REPAIR Right    CARDIAC CATHETERIZATION  09/23/2008   Continued medical therapy - may need GI evaluation in addition.   CARDIAC CATHETERIZATION  10/28/2007   Medical therapy recommended.   CARDIAC CATHETERIZATION  11/18/2006   In-stent restenosis RCA  (50% distal edge, 80% segmental mid, and 50-60% segmental proximal). Successful cutting balloon atherectomy using a [id number] cutting balloon. 3 inflations with atherectomy performed on mid and proximal portions resulting in reduction of 80% mid in-stent restenosis to  less than 20% residual and 50-60% segmental proximal to less than 20% residual without dissection.   CARDIAC CATHETERIZATION  02/26/2006   Severe stenosis in RCA. Stenting performed using IVUS. 3.5x20 Maverick balloon deployed at [employer]. Distal stent-a 4x28 Liberte stent-deployed 12atm 48sec, 12atm 31sec, 4atm 19sec. Mid stent-a 4x28 Liberte stent-deployed 14atm 45sec, 14atm 60sec, 14atm 44sec. Proximal stent-4x8 Liberte- 14atm 45sec,14atm 47sec, 16atm 43sec. Severely diseased segment then appeared TIMI-3 flow.   CARDIOVASCULAR STRESS TEST  11/17/2012   No significant ECG changes. Septal perfusion defect is new when complared to study from 2010. Abnormal myocardial perfusion imaging with a basal to mid perfusion suggestive of previous MI.   CAROTID DOPPLER  08/09/2011   Bilateral Bulb/Proximal ICA - demonstrated a mild amount of fibrous plaque without evidence of significant diameter reduction reduction or other vascular abnormality.   CHEST TUBE INSERTION Right 09/02/2018   Procedure: Chest Tube Insertion;  Surgeon: Garner Nash, DO;  Location: [place];  Service: Thoracic;  Laterality: Right;   COLONOSCOPY     2003, 2014   CORONARY ANGIOPLASTY WITH STENT PLACEMENT     FEMORAL ARTERY STENT     INGUINAL HERNIA REPAIR Right    IR IMAGING GUIDED PORT INSERTION  11/13/2020   KNEE ARTHROSCOPY Right "multiple"  LAPAROSCOPIC APPENDECTOMY N/A 12/07/2018   Procedure: APPENDECTOMY LAPAROSCOPIC;  Surgeon: Coralie Keens, MD;  Location: [facility];  Service: General;  Laterality: N/A;   LOWER EXTREMITY ARTERIAL DOPPLER  01/31/2011   Bilateral ABIs-normal values with no suggestion of arterial insuff to the lower extremities at rest. Right CIA stent-mild amount of nonhemodynamically significant plaque is noted throughout   [place]   "bone-eating tumor"   PERCUTANEOUS STENT INTERVENTION  04/04/2006 & 04/13/2015   a. Right common iliac artery with an 8.0x18 mm Herculink stent deployed at 12 atm.  Stenosis was reduced from 80% to 0% with brisk flow. b. I-cast stenting to left common iliac artery   PERIPHERAL VASCULAR CATHETERIZATION N/A 04/13/2015   Procedure: Lower Extremity Angiography;  Surgeon: Lorretta Harp, MD; L-oCIA 75%, 40-50% L-EIA, R-CIA stent patent, s/p 8 mm x 38 mm ICast covered stent>>0% stenosis in [place] Right    TRANSTHORACIC ECHOCARDIOGRAM  11/26/2012   EF not noted. Aortic valve-sclerosis without stenosis, no regurgiation.    UPPER GASTROINTESTINAL ENDOSCOPY     US CAROTID DOPPLER BILATERAL ([place] [facility])  08/09/2011   Bilateral Bulb/Proximal ICAa demonstrated a mild amount of fibrous plaque without evidence of significant diameter reduction or any other vascular abnormality.   VIDEO ASSISTED THORACOSCOPY (VATS)/ LOBECTOMY Right 09/14/2018   Procedure: RIGHT VIDEO ASSISTED THORACOSCOPY (VATS)/ RIGHT UPPER LOBECTOMY;  Surgeon: Melrose Nakayama, MD;  Location: [place];  Service: Thoracic;  Laterality: Right;   VIDEO BRONCHOSCOPY WITH ENDOBRONCHIAL NAVIGATION N/A 09/02/2018   Procedure: VIDEO BRONCHOSCOPY WITH ENDOBRONCHIAL NAVIGATION;  Surgeon: Garner Nash, DO;  Location: [place];  Service: Thoracic;  Laterality: N/A;   Patient Active Problem List   Diagnosis Date Noted   CKD (chronic kidney disease) stage 3, GFR 30-59 ml/min ([place]) 03/14/2022   Antineoplastic chemotherapy induced anemia 08/01/2021   Intractable nausea and vomiting 03/01/2021   Generalized abdominal pain 03/01/2021   Elevated lactic acid level 03/01/2021   Port-A-Cath in place 11/21/2020   Adenocarcinoma of right lung, stage 4 ([place]) 10/30/2020   Encounter for antineoplastic immunotherapy 10/30/2020   Bone metastasis 10/01/2020   Abdominal pain 12/07/2018   Appendicitis 12/07/2018   Goals of care, counseling/discussion 10/13/2018   Encounter for antineoplastic chemotherapy 10/13/2018   S/P Right VATS with Right Upper Lobectomy, Lymph Node Sampling,  Intercostal Nerve Block 09/14/2018   Pneumothorax after biopsy 09/02/2018   Stage 1 mild COPD by GOLD classification ([place]) 07/30/2018   Syncope 07/23/2018   Antiplatelet or antithrombotic long-term use 07/14/2018   Nodule of upper lobe of right lung 07/14/2018   Multiple lung nodules on CT 07/14/2018   Nausea vomiting and diarrhea 10/30/2016   Hyponatremia 10/30/2016   Hypocalcemia 10/30/2016   Hypokalemia 04/14/2015   Hypomagnesemia 04/14/2015   Claudication ([place]) 04/13/2015   Essential hypertension 01/14/2014   Hyperlipidemia 01/14/2014   Thrombosed external hemorrhoids 12/07/2013   Chronic anticoagulation 01/20/2013   PVD (peripheral vascular disease) ([place]) 01/20/2013   Coronary atherosclerosis of native coronary artery 01/20/2013   GERD (gastroesophageal reflux disease) 01/20/2013   Benign esophageal stricture 01/20/2013   DENTAL CARIES 07/20/2010    REFERRING DIAG: R26.81 (ICD-10-CM) - Gait instability (Gait instability, muscle atrophy, severe protein calorie malnutrition from adenocarcinoma)  THERAPY DIAG:  History of falling  Difficulty in walking, not elsewhere classified  Muscle weakness (generalized)  Cramp and spasm  Rationale for Evaluation and Treatment Rehabilitation  PERTINENT HISTORY: Likely due to through metastatic cancer.  Discussed having palliative care come into the home for evaluation but he refused at this time.   Cancer has been stable for 2 years.  He has stopped chemo for one year. Scans and blood work every 3 months.  Only mets currently are in left shoulder   PRECAUTIONS: fall  Has patient fallen in last 6 months? Yes. Number of falls 2   LIVING ENVIRONMENT: Lives with: lives with their spouse Lives in: House/apartment Stairs: Yes: Internal: 0 steps; none and External: 4 steps; none Has following equipment at home: Wheelchair (manual) and does not use anything when not in wheelchair   OCCUPATION: disabled   PLOF: Independent with basic  ADLs and Needs assistance with transfers   PATIENT GOALS to be able to function normally without needing assistance  SUBJECTIVE:                                                                                                                                                                                      SUBJECTIVE STATEMENT:  I did not get sore at all from my last session. Fatigue is the thing I am battling the most this week.   Are you having pain? Yes: NPRS scale: 0/10 Pain location: none currently Pain description: na Aggravating factors: na Relieving factors: na   OBJECTIVE: (objective measures completed at initial evaluation unless otherwise dated)  DIAGNOSTIC FINDINGS: na     COGNITION:           Overall cognitive status: Within functional limits for tasks assessed                          SENSATION: WFL     POSTURE: rounded shoulders and forward head     LOWER EXTREMITY ROM:   WFL   LOWER EXTREMITY MMT:   Deferred due to possible bone metastises   FUNCTIONAL TESTS:  5 times sit to stand: 27.23 Timed up and go (TUG): 13.40   GAIT: Distance walked: 30 Assistive device utilized:  refuses a.d. needs cga Level of assistance: CGA Comments: slow, unsteady       TODAY'S TREATMENT:  05/31/22: Nustep (new model) L2 x 7 min no rest with PTA present to monitor. Heel & toe lifts with 2# on thighs 12x each Seated fingers clasped lifting arms 10x2 Marching x 10 with 2.5# Seated LAQ with 2.5# x 10 each LE Blue loop seated clamshells 20x Ball squeezes 20x Unassisted standing: 4 min Walking with RW 2 min 45 sec 54 feet: Pt required to sit and semi-recline after dut to feeling dizzy. Vitals were WNL. Pt required 3-5 min to semi-recline  05/24/22:  NuStep x 6 min level 2 rest break every 2 min Seated toe and heel raises 10x2 Seated fingers clasped lifting arms 10x2 Ball squeezes 10x Blue loop seated clamshells 10x2 Marching x 20 with 2# Seated LAQ with 2# x  10 each LE Standing at walker 3 min Ambulation/gait training with rw,  PT walking behind patient with wheelchair   05/17/22: Seated toe raises 10x2 Seated fingers clasped lifting arms 10x Seated heel raises 10x2 Ball squeezes 10x Green loop seated clamshells 10x2 Standing at walker 2 min, light UE, SBA to montior, second round 2:30 at walker with SBA  Seated glute squeezes 10x 3 sec hold Spoke with wife and pt about doing some standing at the walker at home with his wife in SBA and beginning at 2 min if he felt ok with what we did today then progress slowly. Both agreed and understood.    Eval: Initial eval completed, educated on proper DME needed in the home for safety.  Discussed grab bars in shower, need for rollator walker.      PATIENT EDUCATION:  Education details: Animal nutritionist, DME needs Person educated: Patient and Spouse Education method: Customer service manager Education comprehension: verbalized understanding and needs further education     HOME EXERCISE PROGRAM: Access Code: [email or web address] URL: [email or web address] Date: 05/15/2022 Prepared by: Venetia Night Beuhring  Exercises - Supine Quad Set  - 3 x daily - 7 x weekly - 1 sets - 10 reps - 5 hold - Supine Gluteal Sets  - 3 x daily - 7 x weekly - 1 sets - 10 reps - 5 hold - Supine Straight Leg Raises  - 3 x daily - 7 x weekly - 1 sets - 10 reps - Hooklying Isometric Clamshell  - 3 x daily - 7 x weekly - 1 sets - 20 reps   ASSESSMENT:   CLINICAL IMPRESSION: Pt arrives with good energy and no pain. Pt was able to increase his overall overall workload.without excessive fatigue. Pt reports that in the last week/wekk & a half he is confident and safe to walk from the living room or to his bedroom independently now. He feels he is standing steadier and longer. Pt stood for 4 min unassisted and ambulated 54 feet with RW. Pt needed to lay back for a few minutes after standing and walking due to dizziness.    OBJECTIVE IMPAIRMENTS Abnormal gait, cardiopulmonary status limiting activity, decreased activity tolerance, decreased balance, decreased coordination, decreased endurance, decreased mobility, difficulty walking, decreased ROM, decreased strength, decreased safety awareness, increased muscle spasms, impaired flexibility, and pain.    ACTIVITY LIMITATIONS carrying, lifting, bending, standing, squatting, sleeping, stairs, transfers, bed mobility, and dressing   PARTICIPATION LIMITATIONS: meal prep, cleaning, laundry, driving, shopping, and community activity   [place] and 1 comorbidity: protein deficiency  are also affecting patient's functional outcome.    REHAB POTENTIAL: Good   CLINICAL DECISION MAKING: Stable/uncomplicated   EVALUATION COMPLEXITY: Low     GOALS: Goals reviewed with patient? Yes   SHORT TERM GOALS: Target date: 06/05/2022  Patient will be independent with initial HEP  Baseline: Goal status: Goal met 05/17/22   2.  Patient to be able to stand for at least 2 min to do activity Baseline:  Goal status: Goal met 05/17/22   3.  Patient to be able to walk 50 feet with proper assistive device safely with stand by assist Baseline:  Goal status: On going, walked 54 feet but got dizzy and required to recline for  5 min.    LONG TERM GOALS: Target date: 07/03/2022    Patient to be independent with advanced HEP  Baseline:  Goal status: INITIAL   2.  Patient to be able to stand for up to 15 min to be able to take shower standing safely Baseline:  Goal status: INITIAL   3.  Patient to be able to walk 300 feet with proper assistive device safely and independently Baseline: 10 feet with cga Goal status: INITIAL   4.  Patient scores on 5 times sit to stand and TUG to improve by 5-10 sec. Baseline:  Goal status: INITIAL   5.  Patient to be able to eat normal 3 meals per day to improve iron levels and energy Baseline:  Goal status: INITIAL   6.   Patient to obtain proper DME and assistive device and to report no falls during episode of treatment Baseline:  Goal status: INITIAL     PLAN: PT FREQUENCY: 1-2x/week   PT DURATION: 8 weeks   PLANNED INTERVENTIONS: Therapeutic exercises, Therapeutic activity, Neuromuscular re-education, Balance training, Gait training, Patient/Family education, Self Care, Joint mobilization, Stair training, DME instructions, Aquatic Therapy, Dry Needling, Cryotherapy, Moist heat, Taping, Manual therapy, and Re-evaluation   PLAN FOR NEXT SESSION: NuStep,  LE strengthening, standing tolerance, gait training with rw.     Myrene Galas, PTA 05/31/22 11:42 AM    PHYSICAL THERAPY DISCHARGE SUMMARY  Visits from Start of Care: 5  Current functional level related to goals / functional outcomes: See above   Remaining deficits: See above for most current status.    Education / Equipment: HEP   Patient agrees to discharge. Patient goals were not met. Patient is being discharged due to not returning since the last visit.  Sigurd Sos, PT 09/19/22 4:03 PM   [facility] [address] [place], [place] [address] Phone # [phone] Fax [phone]

## 2022-06-02 ENCOUNTER — Other Ambulatory Visit: Payer: Self-pay | Admitting: Oncology

## 2022-06-02 NOTE — Progress Notes (Signed)
CT scan obtained on May 30, 2022 was read by radiology on June 01, 2022.  These results were called to me to discuss incidental finding of a pneumothorax.  His CT scan was personally reviewed and appears to have chronic findings of pneumothorax.  These findings were communicated to the patient and his wife.  Patient is completely asymptomatic at this time.  Given clear instructions to present to the emergency department if he develops any symptoms of shortness of breath or dyspnea that is worse than his baseline.  They acknowledged understanding of these findings and the instructions.  He has follow-up on June 03, 2022 with Dr. [name]

## 2022-06-03 ENCOUNTER — Inpatient Hospital Stay (HOSPITAL_BASED_OUTPATIENT_CLINIC_OR_DEPARTMENT_OTHER): Payer: BC Managed Care – PPO | Admitting: Internal Medicine

## 2022-06-03 ENCOUNTER — Other Ambulatory Visit: Payer: Self-pay | Admitting: *Deleted

## 2022-06-03 ENCOUNTER — Other Ambulatory Visit: Payer: Self-pay

## 2022-06-03 VITALS — BP 115/87 | HR 89 | Temp 98.1°F | Wt 102.1 lb

## 2022-06-03 DIAGNOSIS — Z23 Encounter for immunization: Secondary | ICD-10-CM | POA: Diagnosis not present

## 2022-06-03 DIAGNOSIS — K219 Gastro-esophageal reflux disease without esophagitis: Secondary | ICD-10-CM | POA: Diagnosis not present

## 2022-06-03 DIAGNOSIS — J439 Emphysema, unspecified: Secondary | ICD-10-CM | POA: Diagnosis not present

## 2022-06-03 DIAGNOSIS — J939 Pneumothorax, unspecified: Secondary | ICD-10-CM | POA: Diagnosis not present

## 2022-06-03 DIAGNOSIS — I252 Old myocardial infarction: Secondary | ICD-10-CM | POA: Diagnosis not present

## 2022-06-03 DIAGNOSIS — Z95828 Presence of other vascular implants and grafts: Secondary | ICD-10-CM | POA: Diagnosis not present

## 2022-06-03 DIAGNOSIS — E785 Hyperlipidemia, unspecified: Secondary | ICD-10-CM | POA: Diagnosis not present

## 2022-06-03 DIAGNOSIS — Z7982 Long term (current) use of aspirin: Secondary | ICD-10-CM | POA: Diagnosis not present

## 2022-06-03 DIAGNOSIS — J9383 Other pneumothorax: Secondary | ICD-10-CM | POA: Diagnosis not present

## 2022-06-03 DIAGNOSIS — Z85118 Personal history of other malignant neoplasm of bronchus and lung: Secondary | ICD-10-CM | POA: Diagnosis not present

## 2022-06-03 DIAGNOSIS — J90 Pleural effusion, not elsewhere classified: Secondary | ICD-10-CM | POA: Diagnosis not present

## 2022-06-03 DIAGNOSIS — Z9221 Personal history of antineoplastic chemotherapy: Secondary | ICD-10-CM | POA: Diagnosis not present

## 2022-06-03 DIAGNOSIS — J95812 Postprocedural air leak: Secondary | ICD-10-CM | POA: Diagnosis not present

## 2022-06-03 DIAGNOSIS — C3490 Malignant neoplasm of unspecified part of unspecified bronchus or lung: Secondary | ICD-10-CM

## 2022-06-03 DIAGNOSIS — Z681 Body mass index (BMI) 19 or less, adult: Secondary | ICD-10-CM | POA: Diagnosis not present

## 2022-06-03 DIAGNOSIS — Z955 Presence of coronary angioplasty implant and graft: Secondary | ICD-10-CM | POA: Diagnosis not present

## 2022-06-03 DIAGNOSIS — Z79899 Other long term (current) drug therapy: Secondary | ICD-10-CM | POA: Diagnosis not present

## 2022-06-03 DIAGNOSIS — I10 Essential (primary) hypertension: Secondary | ICD-10-CM | POA: Diagnosis not present

## 2022-06-03 DIAGNOSIS — J9382 Other air leak: Secondary | ICD-10-CM | POA: Diagnosis present

## 2022-06-03 DIAGNOSIS — Z87891 Personal history of nicotine dependence: Secondary | ICD-10-CM | POA: Diagnosis not present

## 2022-06-03 DIAGNOSIS — I2510 Atherosclerotic heart disease of native coronary artery without angina pectoris: Secondary | ICD-10-CM | POA: Diagnosis not present

## 2022-06-03 DIAGNOSIS — Z7902 Long term (current) use of antithrombotics/antiplatelets: Secondary | ICD-10-CM | POA: Diagnosis not present

## 2022-06-03 DIAGNOSIS — J449 Chronic obstructive pulmonary disease, unspecified: Secondary | ICD-10-CM | POA: Diagnosis not present

## 2022-06-03 DIAGNOSIS — J9811 Atelectasis: Secondary | ICD-10-CM | POA: Diagnosis not present

## 2022-06-03 DIAGNOSIS — Z7951 Long term (current) use of inhaled steroids: Secondary | ICD-10-CM | POA: Diagnosis not present

## 2022-06-03 DIAGNOSIS — R636 Underweight: Secondary | ICD-10-CM | POA: Diagnosis present

## 2022-06-03 NOTE — Progress Notes (Signed)
[place] [phone]   [phone]  OFFICE PROGRESS NOTE  [name], NP [place] [address]  DIAGNOSIS:  Metastatic non-small cell lung cancer initially diagnosed as stage IIIA (T3, N1, M0) non-small cell lung cancer, adenocarcinoma with no actionable mutations presented with multiple pulmonary nodules in the right upper lobe as well as metastatic disease in intraparenchymal lymphadenopathy.  This was diagnosed in February 2020.  The patient has disease recurrence in March 2022. PD-L1 expression 20%. He has no actionable mutations by foundation 1.   PRIOR THERAPY: 1) Status post right upper lobectomy with lymph node dissection. 2) Adjuvant systemic chemotherapy with cisplatin 75 mg/M2 and Alimta 500 mg/M2 every 3 weeks.  Status post 4 cycles. 3) palliative radiotherapy to the metastatic bone disease and the scapula as well as the left supraclavicular area under the care of Dr. Isidore Moos. 3) Systemic chemotherapy with carboplatin for AUC of 5, Alimta 500 mg/M2 and Keytruda 200 mg IV every 3 weeks.  First dose November 06, 2020.  Status post 12 cycles.  Starting from cycle #5, the patient will be treated with maintenance Keytruda and Alimta IV every 3 weeks. Dose reduced to Alimta 400 mg/m2 starting from cycle #11 because of worsening anemia.  Keytruda discontinued from cycle #11 due to possible checkpoint inhibitor toxicity of the stomach.   CURRENT THERAPY: Observation.   INTERVAL HISTORY: Sean Rose 66 y.o. male returns to the clinic today for follow-up visit accompanied by his wife.  The patient is feeling fine today with no concerning complaints except for the persistent mild fatigue.  He mentions that few weeks ago he had few episodes of violent cough and he felt some pain in the sternal area at that time.  He denied having any current shortness of breath or hemoptysis.  He has no nausea, vomiting, diarrhea or  constipation.  He has no headache or visual changes.  He denied having any fever or chills.  He is here today for evaluation with repeat CT scan of the chest, abdomen and pelvis for restaging of his disease.    MEDICAL HISTORY: Past Medical History:  Diagnosis Date   Allergy    Anemia    Anginal pain ([place])    Blood transfusion without reported diagnosis    CAD (coronary artery disease)    CAP (community acquired pneumonia) 09/2016   COPD (chronic obstructive pulmonary disease) (HCC)    Emphysema of lung (HCC)    GERD (gastroesophageal reflux disease)    Heart murmur    "I was told I had one when I was a kid"   Hemorrhoids    History of anal fissures    "no surgeries" (10/30/2016)   Hyperlipidemia    Hypertension    lung ca dx'd 08/2018   with mets to bones in [place]   Myocardial infarction [facility])    Peripheral arterial disease ([place])    status post right common iliac artery stenting back in 2007   Seasonal allergies    Tobacco abuse     ALLERGIES:  is allergic to compazine [prochlorperazine].  MEDICATIONS:  Current Outpatient Medications  Medication Sig Dispense Refill   acetaminophen (TYLENOL) 500 MG tablet Take 1,000 mg by mouth every 6 (six) hours as needed for headache.     amLODipine (NORVASC) 5 MG tablet Take 1 tablet (5 mg total) by mouth daily. 180 tablet 3   aspirin EC 81 MG tablet Take 81 mg by mouth  daily.     clopidogrel (PLAVIX) 75 MG tablet TAKE 1 TABLET BY MOUTH EVERY DAY (Patient taking differently: Take 75 mg by mouth daily.) 90 tablet 3   esomeprazole (NEXIUM) 20 MG capsule Take 40 mg by mouth daily.     fexofenadine (ALLEGRA) 60 MG tablet Take 60 mg by mouth daily.     Fluticasone-Umeclidin-Vilant (TRELEGY ELLIPTA) 100-62.5-25 MCG/ACT AEPB Inhale 1 puff into the lungs daily. 60 each 3   folic acid (FOLVITE) 1 MG tablet TAKE 1 TABLET(1 MG) BY MOUTH DAILY (Patient taking differently: Take 1 mg by mouth daily.) 30 tablet 4   Magnesium 500 MG TABS Take 1 tablet  (500 mg total) by mouth 3 (three) times daily. 8 tablet 0   metoprolol succinate (TOPROL-XL) 25 MG 24 hr tablet TAKE 1 TABLET BY MOUTH DAILY 90 tablet 0   mirtazapine (REMERON) 15 MG tablet Take 1 tablet (15 mg total) by mouth at bedtime. 90 tablet 0   Multiple Vitamin (MULTIVITAMIN WITH MINERALS) TABS tablet Take 1 tablet by mouth daily.     rosuvastatin (CRESTOR) 5 MG tablet TAKE 1 TABLET(5 MG) BY MOUTH EVERY OTHER DAY (Patient taking differently: Take 5 mg by mouth every other day.) 45 tablet 3   No current facility-administered medications for this visit.   Facility-Administered Medications Ordered in Other Visits  Medication Dose Route Frequency Provider Last Rate Last Admin   diphenhydrAMINE (BENADRYL) 25 mg capsule             SURGICAL HISTORY:  Past Surgical History:  Procedure Laterality Date   ANKLE SURGERY Left    "rebuilt it"   ANTERIOR CRUCIATE LIGAMENT REPAIR Right    CARDIAC CATHETERIZATION  09/23/2008   Continued medical therapy - may need GI evaluation in addition.   CARDIAC CATHETERIZATION  10/28/2007   Medical therapy recommended.   CARDIAC CATHETERIZATION  11/18/2006   In-stent restenosis RCA  (50% distal edge, 80% segmental mid, and 50-60% segmental proximal). Successful cutting balloon atherectomy using a [id number] cutting balloon. 3 inflations with atherectomy performed on mid and proximal portions resulting in reduction of 80% mid in-stent restenosis to less than 20% residual and 50-60% segmental proximal to less than 20% residual without dissection.   CARDIAC CATHETERIZATION  02/26/2006   Severe stenosis in RCA. Stenting performed using IVUS. 3.5x20 Maverick balloon deployed at [employer]. Distal stent-a 4x28 Liberte stent-deployed 12atm 48sec, 12atm 31sec, 4atm 19sec. Mid stent-a 4x28 Liberte stent-deployed 14atm 45sec, 14atm 60sec, 14atm 44sec. Proximal stent-4x8 Liberte- 14atm 45sec,14atm 47sec, 16atm 43sec. Severely diseased segment then appeared TIMI-3 flow.    CARDIOVASCULAR STRESS TEST  11/17/2012   No significant ECG changes. Septal perfusion defect is new when complared to study from 2010. Abnormal myocardial perfusion imaging with a basal to mid perfusion suggestive of previous MI.   CAROTID DOPPLER  08/09/2011   Bilateral Bulb/Proximal ICA - demonstrated a mild amount of fibrous plaque without evidence of significant diameter reduction reduction or other vascular abnormality.   CHEST TUBE INSERTION Right 09/02/2018   Procedure: Chest Tube Insertion;  Surgeon: Garner Nash, DO;  Location: [facility];  Service: Thoracic;  Laterality: Right;   COLONOSCOPY     2003, 2014   CORONARY ANGIOPLASTY WITH STENT PLACEMENT     FEMORAL ARTERY STENT     INGUINAL HERNIA REPAIR Right    IR IMAGING GUIDED PORT INSERTION  11/13/2020   KNEE ARTHROSCOPY Right "multiple"   LAPAROSCOPIC APPENDECTOMY N/A 12/07/2018   Procedure: APPENDECTOMY LAPAROSCOPIC;  Surgeon: Coralie Keens,  MD;  Location: [facility];  Service: General;  Laterality: N/A;   LOWER EXTREMITY ARTERIAL DOPPLER  01/31/2011   Bilateral ABIs-normal values with no suggestion of arterial insuff to the lower extremities at rest. Right CIA stent-mild amount of nonhemodynamically significant plaque is noted throughout   [place]   "bone-eating tumor"   PERCUTANEOUS STENT INTERVENTION  04/04/2006 & 04/13/2015   a. Right common iliac artery with an 8.0x18 mm Herculink stent deployed at 12 atm. Stenosis was reduced from 80% to 0% with brisk flow. b. I-cast stenting to left common iliac artery   PERIPHERAL VASCULAR CATHETERIZATION N/A 04/13/2015   Procedure: Lower Extremity Angiography;  Surgeon: Lorretta Harp, MD; L-oCIA 75%, 40-50% L-EIA, R-CIA stent patent, s/p 8 mm x 38 mm ICast covered stent>>0% stenosis in [place] Right    TRANSTHORACIC ECHOCARDIOGRAM  11/26/2012   EF not noted. Aortic valve-sclerosis without stenosis, no regurgiation.    UPPER  GASTROINTESTINAL ENDOSCOPY     US CAROTID DOPPLER BILATERAL ([place] [facility]X)  08/09/2011   Bilateral Bulb/Proximal ICAa demonstrated a mild amount of fibrous plaque without evidence of significant diameter reduction or any other vascular abnormality.   VIDEO ASSISTED THORACOSCOPY (VATS)/ LOBECTOMY Right 09/14/2018   Procedure: RIGHT VIDEO ASSISTED THORACOSCOPY (VATS)/ RIGHT UPPER LOBECTOMY;  Surgeon: Melrose Nakayama, MD;  Location: [facility];  Service: Thoracic;  Laterality: Right;   VIDEO BRONCHOSCOPY WITH ENDOBRONCHIAL NAVIGATION N/A 09/02/2018   Procedure: VIDEO BRONCHOSCOPY WITH ENDOBRONCHIAL NAVIGATION;  Surgeon: Garner Nash, DO;  Location: [facility];  Service: Thoracic;  Laterality: N/A;    REVIEW OF SYSTEMS:  Constitutional: positive for fatigue Eyes: negative Ears, nose, mouth, throat, and face: negative Respiratory: positive for cough Cardiovascular: negative Gastrointestinal: negative Genitourinary:negative Integument/breast: negative Hematologic/lymphatic: negative Musculoskeletal:positive for arthralgias Neurological: negative Behavioral/Psych: negative Endocrine: negative Allergic/Immunologic: negative   PHYSICAL EXAMINATION: General appearance: alert, cooperative, fatigued, and no distress Head: Normocephalic, without obvious abnormality, atraumatic Neck: no adenopathy, no JVD, supple, symmetrical, trachea midline, and thyroid not enlarged, symmetric, no tenderness/mass/nodules Lymph nodes: Cervical, supraclavicular, and axillary nodes normal. Resp: clear to auscultation bilaterally Back: symmetric, no curvature. ROM normal. No CVA tenderness. Cardio: regular rate and rhythm, S1, S2 normal, no murmur, click, rub or gallop GI: soft, non-tender; bowel sounds normal; no masses,  no organomegaly Extremities: extremities normal, atraumatic, no cyanosis or edema Neurologic: Alert and oriented X 3, normal strength and tone. Normal symmetric reflexes. Normal coordination and  gait  ECOG PERFORMANCE STATUS: 1 - Symptomatic but completely ambulatory  Blood pressure 115/87, pulse 89, temperature 98.1 F (36.7 C), temperature source Tympanic, weight 102 lb 1.6 oz (46.3 kg), SpO2 98 %.  LABORATORY DATA: Lab Results  Component Value Date   WBC 9.1 05/30/2022   HGB 10.2 (L) 05/30/2022   HCT 28.1 (L) 05/30/2022   MCV 95.3 05/30/2022   PLT 477 (H) 05/30/2022      Chemistry      Component Value Date/Time   NA 126 (L) 05/30/2022 1042   NA 130 (L) 03/05/2017 1110   K 3.8 05/30/2022 1042   CL 91 (L) 05/30/2022 1042   CO2 28 05/30/2022 1042   BUN 12 05/30/2022 1042   BUN 4 (L) 03/05/2017 1110   CREATININE 1.43 (H) 05/30/2022 1042   CREATININE 0.76 04/28/2020 0826      Component Value Date/Time   CALCIUM 8.7 (L) 05/30/2022 1042   ALKPHOS 116 05/30/2022 1042   AST 20  05/30/2022 1042   ALT 8 05/30/2022 1042   BILITOT 0.4 05/30/2022 1042       RADIOGRAPHIC STUDIES: CT Chest Wo Contrast  Result Date: 06/01/2022 CLINICAL DATA:  Restaging non-small cell lung cancer. * Tracking Code: BO * EXAM: CT CHEST, ABDOMEN AND PELVIS WITHOUT CONTRAST TECHNIQUE: Multidetector CT imaging of the chest, abdomen and pelvis was performed following the standard protocol without IV contrast. RADIATION DOSE REDUCTION: This exam was performed according to the departmental dose-optimization program which includes automated exposure control, adjustment of the mA and/or kV according to patient size and/or use of iterative reconstruction technique. COMPARISON:  01/25/2022 FINDINGS: CT CHEST FINDINGS Cardiovascular: Heart size appears within normal limits. Aortic atherosclerosis. Coronary artery calcifications. No pericardial effusion. Mediastinum/Nodes: No enlarged supraclavicular, axillary, or mediastinal lymph nodes. Hilar lymph nodes are suboptimally evaluated due to lack of IV contrast. New pneumomediastinum is identified with gas tracking into bilateral supraclavicular regions and right  lower neck. Pneumomediastinum also extends into the ventral lower chest. Lungs/Pleura: Unchanged appearance of small left pleural effusion with overlying compressive type atelectasis. Right pleural effusion which appears small to moderate in size may be partially locule and appears similar in volume to the previous exam. Overlying area of chronic appearing atelectasis containing some calcifications noted, image 28/2. Postoperative change from previous right upper lobe resection identified. There is severe changes of bullous emphysema. There is a new anterior pneumothorax which overlies the right middle lobe and right lobe measuring 8.3 x 5.1 by 15.8 cm, image 63/6 and image 87/4. This contains some internal areas of septation. Unchanged appearance of nodular architectural distortion and scarring identified along the posterior and lateral subpleural left upper lobe, image 28/4. No new pulmonary nodule or mass identified. Musculoskeletal: Mixed lytic and sclerotic lesion involving the left scapula is again noted in appears similar to the previous exam, image 15/4. Stable sclerosis associated with the lateral aspect of the left third rib which may reflect a underlying remote rib fracture. CT ABDOMEN PELVIS FINDINGS Hepatobiliary: No focal liver abnormality is seen. No gallstones, gallbladder wall thickening, or biliary dilatation. Pancreas: Unremarkable. No pancreatic ductal dilatation or surrounding inflammatory changes. Spleen: Normal in size without focal abnormality. Adrenals/Urinary Tract: Normal adrenal glands. No suspicious kidney mass. No nephrolithiasis or hydronephrosis. Urinary bladder is unremarkable. Stomach/Bowel: Wall thickening involving the stomach is again noted and appears similar to the previous exam. No bowel wall thickening, inflammation, or distension. Vascular/Lymphatic: Aortic atherosclerosis. No signs of abdominopelvic adenopathy. Reproductive: Prostate is unremarkable. Other: No abdominal  wall hernia or abnormality. No abdominopelvic ascites. Musculoskeletal: No acute or significant osseous findings. IMPRESSION: 1. New anterior pneumothorax overlies the right middle lobe and right lobe measuring 8.3 x 5.1 by 15.8 cm (volume = 350 cm^3). This contains some internal areas of septation, which suggests this may represent a chronic finding. There is also pneumomediastinum with gas tracking into bilateral supraclavicular regions and right lower neck. Pneumomediastinum also extends into the ventral lower chest. 2. Unchanged appearance of bilateral pleural effusions with overlying compressive type atelectasis. No specific findings identified to suggest locally recurrent tumor within the chest or new sites of metastatic disease within the chest, abdomen or pelvis. 3. Stable appearance of treated metastasis involving the left scapula. 4. Unchanged appearance of chronic gastric wall thickening. 5. Aortic Atherosclerosis (ICD10-I70.0) and Emphysema (ICD10-J43.9). Critical Value/emergent results were called by telephone at the time of interpretation on 06/01/2022 at 12:41 pm to provider Zola Button MD, who verbally acknowledged these results. Electronically Signed   By: Lovena Le  Clovis Riley M.D.   On: 06/01/2022 12:43   CT Abdomen Pelvis Wo Contrast  Result Date: 06/01/2022 CLINICAL DATA:  Restaging non-small cell lung cancer. * Tracking Code: BO * EXAM: CT CHEST, ABDOMEN AND PELVIS WITHOUT CONTRAST TECHNIQUE: Multidetector CT imaging of the chest, abdomen and pelvis was performed following the standard protocol without IV contrast. RADIATION DOSE REDUCTION: This exam was performed according to the departmental dose-optimization program which includes automated exposure control, adjustment of the mA and/or kV according to patient size and/or use of iterative reconstruction technique. COMPARISON:  01/25/2022 FINDINGS: CT CHEST FINDINGS Cardiovascular: Heart size appears within normal limits. Aortic atherosclerosis.  Coronary artery calcifications. No pericardial effusion. Mediastinum/Nodes: No enlarged supraclavicular, axillary, or mediastinal lymph nodes. Hilar lymph nodes are suboptimally evaluated due to lack of IV contrast. New pneumomediastinum is identified with gas tracking into bilateral supraclavicular regions and right lower neck. Pneumomediastinum also extends into the ventral lower chest. Lungs/Pleura: Unchanged appearance of small left pleural effusion with overlying compressive type atelectasis. Right pleural effusion which appears small to moderate in size may be partially locule and appears similar in volume to the previous exam. Overlying area of chronic appearing atelectasis containing some calcifications noted, image 28/2. Postoperative change from previous right upper lobe resection identified. There is severe changes of bullous emphysema. There is a new anterior pneumothorax which overlies the right middle lobe and right lobe measuring 8.3 x 5.1 by 15.8 cm, image 63/6 and image 87/4. This contains some internal areas of septation. Unchanged appearance of nodular architectural distortion and scarring identified along the posterior and lateral subpleural left upper lobe, image 28/4. No new pulmonary nodule or mass identified. Musculoskeletal: Mixed lytic and sclerotic lesion involving the left scapula is again noted in appears similar to the previous exam, image 15/4. Stable sclerosis associated with the lateral aspect of the left third rib which may reflect a underlying remote rib fracture. CT ABDOMEN PELVIS FINDINGS Hepatobiliary: No focal liver abnormality is seen. No gallstones, gallbladder wall thickening, or biliary dilatation. Pancreas: Unremarkable. No pancreatic ductal dilatation or surrounding inflammatory changes. Spleen: Normal in size without focal abnormality. Adrenals/Urinary Tract: Normal adrenal glands. No suspicious kidney mass. No nephrolithiasis or hydronephrosis. Urinary bladder is  unremarkable. Stomach/Bowel: Wall thickening involving the stomach is again noted and appears similar to the previous exam. No bowel wall thickening, inflammation, or distension. Vascular/Lymphatic: Aortic atherosclerosis. No signs of abdominopelvic adenopathy. Reproductive: Prostate is unremarkable. Other: No abdominal wall hernia or abnormality. No abdominopelvic ascites. Musculoskeletal: No acute or significant osseous findings. IMPRESSION: 1. New anterior pneumothorax overlies the right middle lobe and right lobe measuring 8.3 x 5.1 by 15.8 cm (volume = 350 cm^3). This contains some internal areas of septation, which suggests this may represent a chronic finding. There is also pneumomediastinum with gas tracking into bilateral supraclavicular regions and right lower neck. Pneumomediastinum also extends into the ventral lower chest. 2. Unchanged appearance of bilateral pleural effusions with overlying compressive type atelectasis. No specific findings identified to suggest locally recurrent tumor within the chest or new sites of metastatic disease within the chest, abdomen or pelvis. 3. Stable appearance of treated metastasis involving the left scapula. 4. Unchanged appearance of chronic gastric wall thickening. 5. Aortic Atherosclerosis (ICD10-I70.0) and Emphysema (ICD10-J43.9). Critical Value/emergent results were called by telephone at the time of interpretation on 06/01/2022 at 12:41 pm to provider Zola Button MD, who verbally acknowledged these results. Electronically Signed   By: Kerby Moors M.D.   On: 06/01/2022 12:43    ASSESSMENT  AND PLAN: This is a very pleasant 67 years old white male with a stage IIIB non-small cell lung cancer, adenocarcinoma with no actionable mutations status post right upper lobectomy with lymph node dissection under the care of [name]. The patient completed a course of adjuvant treatment with cisplatin and Alimta status post 4 cycles.  He tolerated the previous 4  cycles of his treatment fairly well. The patient has been in observation but the recent imaging studies showed suspicious bone lesion in the left shoulder area. I ordered a PET scan which was performed on 09/25/2020 and it showed metastatic disease involving the left scapula and left supraclavicular lymph nodes. The patient had ultrasound-guided core biopsy of the left supraclavicular lymph node and it was positive for metastatic carcinoma. He underwent palliative radiotherapy to the metastatic bone lesion in the left scapula and he is feeling much better. PD-L1 expression 20%. He was tested in the past for molecular study by foundation 1 and it was reported to be negative for actionable mutations. The patient started systemic chemotherapy with carboplatin for AUC of 5, Alimta 500 Mg/M2 and Keytruda 200 Mg IV every 3 weeks status post 12 cycles. Starting from cycle #5 he is treated with maintenance Alimta and Keytruda every 3 weeks.  His dose of Alimta was reduced to 400 Mg/M2 starting from cycle #5 because of worsening anemia and Keytruda was also discontinued starting cycle #11 secondary to suspicious immunotherapy induced gastritis.  His treatment was discontinued after cycle #12 secondary to intolerance. The patient is currently on observation since December 2022.   The patient is feeling fine today with no concerning complaints except for mild fatigue as well as cough.  He denied having any significant shortness of breath or hemoptysis. He had repeat CT scan of the chest, abdomen and pelvis performed recently.  I personally and independently reviewed the scan images and discussed the result and showed the images to the patient and his wife. His scan showed no concerning findings for disease progression but the patient had right-sided hydropneumothorax that was not seen on the previous imaging study and this probably secondary to rupture of emphysematous bulla after his violent episodes of cough few  weeks ago. I recommended for the patient to see [name] for evaluation and recommendation regarding the right pneumothorax. I will see the patient back for follow-up visit in around 4 months for evaluation and repeat CT scan of the chest, abdomen and pelvis for restaging of his disease. I will also arrange for the patient to have Port-A-Cath flush every 2 months. For the anemia, he will continue on the oral iron supplements. The patient was advised to call immediately if he has any other concerning symptoms in the interval.  The patient voices understanding of current disease status and treatment options and is in agreement with the current care plan.  All questions were answered. The patient knows to call the clinic with any problems, questions or concerns. We can certainly see the patient much sooner if necessary.  The total time spent in the appointment was 35 minutes.  Disclaimer: This note was dictated with voice recognition software. Similar sounding words can inadvertently be transcribed and may not be corrected upon review.

## 2022-06-04 ENCOUNTER — Encounter: Payer: Self-pay | Admitting: *Deleted

## 2022-06-05 ENCOUNTER — Ambulatory Visit: Payer: BC Managed Care – PPO

## 2022-06-06 ENCOUNTER — Encounter: Payer: Self-pay | Admitting: Thoracic Surgery (Cardiothoracic Vascular Surgery)

## 2022-06-06 ENCOUNTER — Encounter (HOSPITAL_COMMUNITY): Payer: Self-pay

## 2022-06-06 ENCOUNTER — Other Ambulatory Visit: Payer: Self-pay

## 2022-06-06 ENCOUNTER — Inpatient Hospital Stay (HOSPITAL_COMMUNITY)
Admission: RE | Admit: 2022-06-06 | Discharge: 2022-06-10 | DRG: 200 | Disposition: A | Discharge status: Home / Self Care | Payer: BC Managed Care – PPO | Attending: Thoracic Surgery (Cardiothoracic Vascular Surgery) | Admitting: Thoracic Surgery (Cardiothoracic Vascular Surgery)

## 2022-06-06 ENCOUNTER — Institutional Professional Consult (permissible substitution) (INDEPENDENT_AMBULATORY_CARE_PROVIDER_SITE_OTHER): Payer: BC Managed Care – PPO | Admitting: Thoracic Surgery (Cardiothoracic Vascular Surgery)

## 2022-06-06 ENCOUNTER — Encounter (HOSPITAL_COMMUNITY): Payer: Self-pay | Admitting: Thoracic Surgery (Cardiothoracic Vascular Surgery)

## 2022-06-06 ENCOUNTER — Ambulatory Visit
Admission: RE | Admit: 2022-06-06 | Discharge: 2022-06-06 | Disposition: A | Discharge status: Home / Self Care | Payer: BC Managed Care – PPO | Source: Ambulatory Visit | Attending: Thoracic Surgery (Cardiothoracic Vascular Surgery) | Admitting: Thoracic Surgery (Cardiothoracic Vascular Surgery)

## 2022-06-06 VITALS — BP 130/86 | HR 82 | Resp 20 | Ht 67.5 in | Wt 102.0 lb

## 2022-06-06 DIAGNOSIS — K219 Gastro-esophageal reflux disease without esophagitis: Secondary | ICD-10-CM | POA: Diagnosis present

## 2022-06-06 DIAGNOSIS — I10 Essential (primary) hypertension: Secondary | ICD-10-CM | POA: Diagnosis present

## 2022-06-06 DIAGNOSIS — Z87891 Personal history of nicotine dependence: Secondary | ICD-10-CM | POA: Diagnosis not present

## 2022-06-06 DIAGNOSIS — Z7951 Long term (current) use of inhaled steroids: Secondary | ICD-10-CM

## 2022-06-06 DIAGNOSIS — J449 Chronic obstructive pulmonary disease, unspecified: Secondary | ICD-10-CM | POA: Diagnosis present

## 2022-06-06 DIAGNOSIS — E785 Hyperlipidemia, unspecified: Secondary | ICD-10-CM | POA: Diagnosis present

## 2022-06-06 DIAGNOSIS — I252 Old myocardial infarction: Secondary | ICD-10-CM | POA: Diagnosis not present

## 2022-06-06 DIAGNOSIS — J9383 Other pneumothorax: Principal | ICD-10-CM | POA: Diagnosis present

## 2022-06-06 DIAGNOSIS — J9382 Other air leak: Secondary | ICD-10-CM | POA: Diagnosis present

## 2022-06-06 DIAGNOSIS — Z7902 Long term (current) use of antithrombotics/antiplatelets: Secondary | ICD-10-CM

## 2022-06-06 DIAGNOSIS — Z85118 Personal history of other malignant neoplasm of bronchus and lung: Secondary | ICD-10-CM | POA: Diagnosis not present

## 2022-06-06 DIAGNOSIS — Z23 Encounter for immunization: Secondary | ICD-10-CM | POA: Diagnosis not present

## 2022-06-06 DIAGNOSIS — Z7982 Long term (current) use of aspirin: Secondary | ICD-10-CM

## 2022-06-06 DIAGNOSIS — J95812 Postprocedural air leak: Secondary | ICD-10-CM | POA: Diagnosis not present

## 2022-06-06 DIAGNOSIS — Z79899 Other long term (current) drug therapy: Secondary | ICD-10-CM | POA: Diagnosis not present

## 2022-06-06 DIAGNOSIS — R636 Underweight: Secondary | ICD-10-CM | POA: Diagnosis present

## 2022-06-06 DIAGNOSIS — Z955 Presence of coronary angioplasty implant and graft: Secondary | ICD-10-CM | POA: Diagnosis not present

## 2022-06-06 DIAGNOSIS — J939 Pneumothorax, unspecified: Secondary | ICD-10-CM

## 2022-06-06 DIAGNOSIS — Z95828 Presence of other vascular implants and grafts: Secondary | ICD-10-CM | POA: Diagnosis not present

## 2022-06-06 DIAGNOSIS — Z681 Body mass index (BMI) 19 or less, adult: Secondary | ICD-10-CM

## 2022-06-06 DIAGNOSIS — I2510 Atherosclerotic heart disease of native coronary artery without angina pectoris: Secondary | ICD-10-CM | POA: Diagnosis present

## 2022-06-06 DIAGNOSIS — Z9221 Personal history of antineoplastic chemotherapy: Secondary | ICD-10-CM

## 2022-06-06 MED ORDER — AMLODIPINE BESYLATE 5 MG PO TABS
5.0000 mg | ORAL_TABLET | Freq: Every day | ORAL | Status: DC
  Administered 2022-06-07 – 2022-06-10 (×4): 5 mg via ORAL
  Filled 2022-06-06 (×4): qty 1

## 2022-06-06 MED ORDER — METOPROLOL SUCCINATE ER 25 MG PO TB24
25.0000 mg | ORAL_TABLET | Freq: Every day | ORAL | Status: DC
  Administered 2022-06-07 – 2022-06-10 (×4): 25 mg via ORAL
  Filled 2022-06-06 (×4): qty 1

## 2022-06-06 MED ORDER — INFLUENZA VAC A&B SA ADJ QUAD 0.5 ML IM PRSY
0.5000 mL | PREFILLED_SYRINGE | INTRAMUSCULAR | Status: DC
  Filled 2022-06-06: qty 0.5

## 2022-06-06 MED ORDER — LORATADINE 10 MG PO TABS: 10.0000 mg | ORAL_TABLET | Freq: Every day | ORAL | Status: DC | PRN

## 2022-06-06 MED ORDER — ACETAMINOPHEN 500 MG PO TABS
1000.0000 mg | ORAL_TABLET | Freq: Four times a day (QID) | ORAL | Status: DC | PRN
  Administered 2022-06-07 – 2022-06-09 (×4): 1000 mg via ORAL
  Filled 2022-06-06 (×5): qty 2

## 2022-06-06 MED ORDER — MIRTAZAPINE 15 MG PO TABS
15.0000 mg | ORAL_TABLET | Freq: Every day | ORAL | Status: DC
  Administered 2022-06-06 – 2022-06-09 (×3): 15 mg via ORAL
  Filled 2022-06-06 (×4): qty 1

## 2022-06-06 MED ORDER — UMECLIDINIUM BROMIDE 62.5 MCG/ACT IN AEPB
1.0000 | INHALATION_SPRAY | Freq: Every day | RESPIRATORY_TRACT | Status: DC
  Administered 2022-06-07 – 2022-06-10 (×4): 1 via RESPIRATORY_TRACT
  Filled 2022-06-06: qty 7

## 2022-06-06 MED ORDER — CLOPIDOGREL BISULFATE 75 MG PO TABS
75.0000 mg | ORAL_TABLET | Freq: Every day | ORAL | Status: DC
  Administered 2022-06-07 – 2022-06-10 (×4): 75 mg via ORAL
  Filled 2022-06-06 (×4): qty 1

## 2022-06-06 MED ORDER — ZOLPIDEM TARTRATE 5 MG PO TABS: 5.0000 mg | ORAL_TABLET | Freq: Every evening | ORAL | Status: DC | PRN

## 2022-06-06 MED ORDER — FOLIC ACID 1 MG PO TABS
1.0000 mg | ORAL_TABLET | Freq: Every day | ORAL | Status: DC
  Administered 2022-06-07 – 2022-06-10 (×4): 1 mg via ORAL
  Filled 2022-06-06 (×4): qty 1

## 2022-06-06 MED ORDER — MAGNESIUM GLUCONATE 500 MG PO TABS
500.0000 mg | ORAL_TABLET | Freq: Two times a day (BID) | ORAL | Status: DC
  Administered 2022-06-06 – 2022-06-10 (×7): 500 mg via ORAL
  Filled 2022-06-06 (×9): qty 1

## 2022-06-06 MED ORDER — SODIUM CHLORIDE 0.9 % IV SOLN: INTRAVENOUS | Status: DC

## 2022-06-06 MED ORDER — FLUTICASONE FUROATE-VILANTEROL 100-25 MCG/ACT IN AEPB
1.0000 | INHALATION_SPRAY | Freq: Every day | RESPIRATORY_TRACT | Status: DC
  Administered 2022-06-07 – 2022-06-10 (×4): 1 via RESPIRATORY_TRACT
  Filled 2022-06-06: qty 28

## 2022-06-06 MED ORDER — ASPIRIN 81 MG PO TBEC
81.0000 mg | DELAYED_RELEASE_TABLET | Freq: Every day | ORAL | Status: DC
  Administered 2022-06-07 – 2022-06-10 (×4): 81 mg via ORAL
  Filled 2022-06-06 (×4): qty 1

## 2022-06-06 MED ORDER — ORAL CARE MOUTH RINSE: 15.0000 mL | OROMUCOSAL | Status: DC | PRN

## 2022-06-06 MED ORDER — ROSUVASTATIN CALCIUM 5 MG PO TABS
5.0000 mg | ORAL_TABLET | ORAL | Status: DC
  Administered 2022-06-08 – 2022-06-10 (×2): 5 mg via ORAL
  Filled 2022-06-06 (×2): qty 1

## 2022-06-06 MED ORDER — PANTOPRAZOLE SODIUM 40 MG PO TBEC
40.0000 mg | DELAYED_RELEASE_TABLET | Freq: Every day | ORAL | Status: DC
  Administered 2022-06-07 – 2022-06-10 (×4): 40 mg via ORAL
  Filled 2022-06-06 (×4): qty 1

## 2022-06-06 NOTE — H&P (Deleted)
  The note originally documented on this encounter has been moved the the encounter in which it belongs.  

## 2022-06-06 NOTE — H&P (Signed)
[address]       [place] [address]             [phone]                  HPI: Mr. Sean Rose is sent for consultation regarding a pneumothorax seen on a CT of the chest.   Brently Voorhis is a 66 year old man with a past history of tobacco abuse, severe COPD, stage IIIa non-small cell carcinoma of the lung status post right upper lobectomy followed by adjuvant chemotherapy, hypertension, hyperlipidemia, CAD, mild, PAD, reflux, heart murmur, and anemia.     He had a right upper lobectomy in 2020 for a T3, N1, stage IIIa non-small cell carcinoma.  Postoperatively he had a prolonged air leak and went home with a tube.  Took about a month for the airleak to completely resolved.  He had postoperative adjuvant chemotherapy and has been disease-free since then.   Recently he had a CT of the chest for surveillance.  Prior to the CT he developed acute onset of right-sided chest and shoulder pain and shortness of breath.  Initially this was about a 10 on 1-10 scale.  He did not seek medical attention.  He had a CT scan and it showed a loculated pneumothorax with pneumomediastinum and some slight shift of the mediastinum.   He saw Dr. [name] on 06/03/2022.  His symptoms were better but not resolved.  He was referred here for evaluation.  He says that that yesterday his symptoms had improved to the point where his discomfort and shortness of breath were about the 4.  However today he has felt a little more pressure and would describe it as a 5.  Not nearly as severe as it was initially.       Past Medical History:  Diagnosis Date   Allergy     Anemia     Anginal pain ([place])     Blood transfusion without reported diagnosis     CAD (coronary artery disease)     CAP (community acquired pneumonia) 09/2016   COPD (chronic obstructive pulmonary disease) (HCC)     Emphysema of lung (HCC)     GERD (gastroesophageal reflux disease)     Heart murmur      "I was told I had one when  I was a kid"   Hemorrhoids     History of anal fissures      "no surgeries" (10/30/2016)   Hyperlipidemia     Hypertension     lung ca dx'd 08/2018    with mets to bones in arms   Myocardial infarction [facility])     Peripheral arterial disease ([place])      status post right common iliac artery stenting back in 2007   Seasonal allergies     Tobacco abuse           Past Surgical History:  Procedure Laterality Date   ANKLE SURGERY Left      "rebuilt it"   ANTERIOR CRUCIATE LIGAMENT REPAIR Right     CARDIAC CATHETERIZATION   09/23/2008    Continued medical therapy - may need GI evaluation in addition.   CARDIAC CATHETERIZATION   10/28/2007    Medical therapy recommended.   CARDIAC CATHETERIZATION   11/18/2006    In-stent restenosis RCA  (50% distal edge, 80% segmental mid, and 50-60% segmental proximal). Successful cutting balloon atherectomy using a [id number] cutting balloon. 3 inflations with  atherectomy performed on mid and proximal portions resulting in reduction of 80% mid in-stent restenosis to less than 20% residual and 50-60% segmental proximal to less than 20% residual without dissection.   CARDIAC CATHETERIZATION   02/26/2006    Severe stenosis in RCA. Stenting performed using IVUS. 3.5x20 Maverick balloon deployed at [employer]. Distal stent-a 4x28 Liberte stent-deployed 12atm 48sec, 12atm 31sec, 4atm 19sec. Mid stent-a 4x28 Liberte stent-deployed 14atm 45sec, 14atm 60sec, 14atm 44sec. Proximal stent-4x8 Liberte- 14atm 45sec,14atm 47sec, 16atm 43sec. Severely diseased segment then appeared TIMI-3 flow.   CARDIOVASCULAR STRESS TEST   11/17/2012    No significant ECG changes. Septal perfusion defect is new when complared to study from 2010. Abnormal myocardial perfusion imaging with a basal to mid perfusion suggestive of previous MI.   CAROTID DOPPLER   08/09/2011    Bilateral Bulb/Proximal ICA - demonstrated a mild amount of fibrous plaque without evidence of significant diameter reduction reduction  or other vascular abnormality.   CHEST TUBE INSERTION Right 09/02/2018    Procedure: Chest Tube Insertion;  Surgeon: Garner Nash, DO;  Location: [facility];  Service: Thoracic;  Laterality: Right;   COLONOSCOPY        2003, 2014   CORONARY ANGIOPLASTY WITH STENT PLACEMENT       FEMORAL ARTERY STENT       INGUINAL HERNIA REPAIR Right     IR IMAGING GUIDED PORT INSERTION   11/13/2020   KNEE ARTHROSCOPY Right "multiple"   LAPAROSCOPIC APPENDECTOMY N/A 12/07/2018    Procedure: APPENDECTOMY LAPAROSCOPIC;  Surgeon: Coralie Keens, MD;  Location: [facility];  Service: General;  Laterality: N/A;   LOWER EXTREMITY ARTERIAL DOPPLER   01/31/2011    Bilateral ABIs-normal values with no suggestion of arterial insuff to the lower extremities at rest. Right CIA stent-mild amount of nonhemodynamically significant plaque is noted throughout   [place]    "bone-eating tumor"   PERCUTANEOUS STENT INTERVENTION   04/04/2006 & 04/13/2015    a. Right common iliac artery with an 8.0x18 mm Herculink stent deployed at 12 atm. Stenosis was reduced from 80% to 0% with brisk flow. b. I-cast stenting to left common iliac artery   PERIPHERAL VASCULAR CATHETERIZATION N/A 04/13/2015    Procedure: Lower Extremity Angiography;  Surgeon: Lorretta Harp, MD; L-oCIA 75%, 40-50% L-EIA, R-CIA stent patent, s/p 8 mm x 38 mm ICast covered stent>>0% stenosis in [place] Right     TRANSTHORACIC ECHOCARDIOGRAM   11/26/2012    EF not noted. Aortic valve-sclerosis without stenosis, no regurgiation.    UPPER GASTROINTESTINAL ENDOSCOPY       US CAROTID DOPPLER BILATERAL ([place] [facility])   08/09/2011    Bilateral Bulb/Proximal ICAa demonstrated a mild amount of fibrous plaque without evidence of significant diameter reduction or any other vascular abnormality.   VIDEO ASSISTED THORACOSCOPY (VATS)/ LOBECTOMY Right 09/14/2018    Procedure: RIGHT VIDEO ASSISTED THORACOSCOPY (VATS)/ RIGHT UPPER  LOBECTOMY;  Surgeon: Melrose Nakayama, MD;  Location: [facility];  Service: Thoracic;  Laterality: Right;   VIDEO BRONCHOSCOPY WITH ENDOBRONCHIAL NAVIGATION N/A 09/02/2018    Procedure: VIDEO BRONCHOSCOPY WITH ENDOBRONCHIAL NAVIGATION;  Surgeon: Garner Nash, DO;  Location: [facility];  Service: Thoracic;  Laterality: N/A;            Current Outpatient Medications  Medication Sig Dispense Refill   acetaminophen (TYLENOL) 500 MG tablet Take 1,000 mg by mouth every 6 (six) hours as  needed for headache.       aspirin EC 81 MG tablet Take 81 mg by mouth daily.       clopidogrel (PLAVIX) 75 MG tablet TAKE 1 TABLET BY MOUTH EVERY DAY (Patient taking differently: Take 75 mg by mouth daily.) 90 tablet 3   esomeprazole (NEXIUM) 20 MG capsule Take 40 mg by mouth daily.       fexofenadine (ALLEGRA) 60 MG tablet Take 60 mg by mouth daily.       Fluticasone-Umeclidin-Vilant (TRELEGY ELLIPTA) 100-62.5-25 MCG/ACT AEPB Inhale 1 puff into the lungs daily. 60 each 3   folic acid (FOLVITE) 1 MG tablet TAKE 1 TABLET(1 MG) BY MOUTH DAILY (Patient taking differently: Take 1 mg by mouth daily.) 30 tablet 4   Magnesium 500 MG TABS Take 1 tablet (500 mg total) by mouth 3 (three) times daily. 8 tablet 0   metoprolol succinate (TOPROL-XL) 25 MG 24 hr tablet TAKE 1 TABLET BY MOUTH DAILY 90 tablet 0   mirtazapine (REMERON) 15 MG tablet Take 1 tablet (15 mg total) by mouth at bedtime. 90 tablet 0   Multiple Vitamin (MULTIVITAMIN WITH MINERALS) TABS tablet Take 1 tablet by mouth daily.       rosuvastatin (CRESTOR) 5 MG tablet TAKE 1 TABLET(5 MG) BY MOUTH EVERY OTHER DAY (Patient taking differently: Take 5 mg by mouth every other day.) 45 tablet 3   amLODipine (NORVASC) 5 MG tablet Take 1 tablet (5 mg total) by mouth daily. 180 tablet 3    No current facility-administered medications for this visit.             Facility-Administered Medications Ordered in Other Visits  Medication Dose Route Frequency Provider Last Rate Last  Admin   diphenhydrAMINE (BENADRYL) 25 mg capsule                  Physical Exam BP 130/86   Pulse 82   Resp 20   Ht 5' 7.5" (1.715 m)   Wt 102 lb (46.3 kg)   SpO2 99% Comment: RA  BMI 15.9 kg/m  66 year old frail appearing man in wheelchair  No acute distress Thin Alert and oriented x3 with no focal deficits Lungs with diminished breath sounds bilaterally Cardiac regular rate and rhythm No subcutaneous emphysema Trachea midline   Diagnostic Tests:   CT CHEST, ABDOMEN AND PELVIS WITHOUT CONTRAST   TECHNIQUE: Multidetector CT imaging of the chest, abdomen and pelvis was performed following the standard protocol without IV contrast.   RADIATION DOSE REDUCTION: This exam was performed according to the departmental dose-optimization program which includes automated exposure control, adjustment of the mA and/or kV according to patient size and/or use of iterative reconstruction technique.   COMPARISON:  01/25/2022   FINDINGS: CT CHEST FINDINGS   Cardiovascular: Heart size appears within normal limits. Aortic atherosclerosis. Coronary artery calcifications. No pericardial effusion.   Mediastinum/Nodes: No enlarged supraclavicular, axillary, or mediastinal lymph nodes. Hilar lymph nodes are suboptimally evaluated due to lack of IV contrast. New pneumomediastinum is identified with gas tracking into bilateral supraclavicular regions and right lower neck. Pneumomediastinum also extends into the ventral lower chest.   Lungs/Pleura: Unchanged appearance of small left pleural effusion with overlying compressive type atelectasis. Right pleural effusion which appears small to moderate in size may be partially locule and appears similar in volume to the previous exam. Overlying area of chronic appearing atelectasis containing some calcifications noted, image 28/2. Postoperative change from previous right upper lobe resection identified.   There is severe  changes of bullous  emphysema. There is a new anterior pneumothorax which overlies the right middle lobe and right lobe measuring 8.3 x 5.1 by 15.8 cm, image 63/6 and image 87/4. This contains some internal areas of septation.   Unchanged appearance of nodular architectural distortion and scarring identified along the posterior and lateral subpleural left upper lobe, image 28/4. No new pulmonary nodule or mass identified.   Musculoskeletal: Mixed lytic and sclerotic lesion involving the left scapula is again noted in appears similar to the previous exam, image 15/4. Stable sclerosis associated with the lateral aspect of the left third rib which may reflect a underlying remote rib fracture.   CT ABDOMEN PELVIS FINDINGS   Hepatobiliary: No focal liver abnormality is seen. No gallstones, gallbladder wall thickening, or biliary dilatation.   Pancreas: Unremarkable. No pancreatic ductal dilatation or surrounding inflammatory changes.   Spleen: Normal in size without focal abnormality.   Adrenals/Urinary Tract: Normal adrenal glands. No suspicious kidney mass. No nephrolithiasis or hydronephrosis. Urinary bladder is unremarkable.   Stomach/Bowel: Wall thickening involving the stomach is again noted and appears similar to the previous exam. No bowel wall thickening, inflammation, or distension.   Vascular/Lymphatic: Aortic atherosclerosis. No signs of abdominopelvic adenopathy.   Reproductive: Prostate is unremarkable.   Other: No abdominal wall hernia or abnormality. No abdominopelvic ascites.   Musculoskeletal: No acute or significant osseous findings.   IMPRESSION: 1. New anterior pneumothorax overlies the right middle lobe and right lobe measuring 8.3 x 5.1 by 15.8 cm (volume = 350 cm^3). This contains some internal areas of septation, which suggests this may represent a chronic finding. There is also pneumomediastinum with gas tracking into bilateral supraclavicular regions and right lower  neck. Pneumomediastinum also extends into the ventral lower chest. 2. Unchanged appearance of bilateral pleural effusions with overlying compressive type atelectasis. No specific findings identified to suggest locally recurrent tumor within the chest or new sites of metastatic disease within the chest, abdomen or pelvis. 3. Stable appearance of treated metastasis involving the left scapula. 4. Unchanged appearance of chronic gastric wall thickening. 5. Aortic Atherosclerosis (ICD10-I70.0) and Emphysema (ICD10-J43.9).   Critical Value/emergent results were called by telephone at the time of interpretation on 06/01/2022 at 12:41 pm to provider Zola Button MD, who verbally acknowledged these results.     Electronically Signed   By: Kerby Moors M.D.   On: 06/01/2022 12:43 ADDENDUM REPORT: 06/06/2022 15:54   ADDENDUM: Findings discussed with [name]. The most recent chest x-ray is from March 13, 2022, not May 30, 2022 as dictated above. The CT was on 05/30/2022. There does appear to be more midline shift to the left on the current chest x-ray compared to the recent CT.     Electronically Signed   By: Franchot Gallo M.D.   On: 06/06/2022 15:54    Addended by Barnet Glasgow, MD on 06/06/2022  3:56 PM    Study Result   Narrative & Impression  CLINICAL DATA:  Follow-up pneumothorax.  Lung cancer   EXAM: CHEST - 2 VIEW   COMPARISON:  Chest 05/30/2022.  CT chest 05/30/2022   FINDINGS: Heart and mediastinal structures are displaced to the left which is new since the prior study. This could be due to tension type pneumothorax. Pleural line not definitely seen on this view. Surgical staple line in the right apex. The pneumothorax was loculated anteriorly on CT and may have expanded in the interval. Small right pleural effusion similar to the prior study.   Small  left effusion and left lower lobe atelectasis improved. Negative for heart failure or edema.    Port-A-Cath tip lower SVC unchanged   IMPRESSION: 1. New displacement of the heart and mediastinal structures to the left. This could be due to tension type pneumothorax which is loculated anteriorly. Consider follow-up chest CT. 2. Small right pleural effusion unchanged. 3. Improvement in left lower lobe atelectasis and effusion. 4. Call report pending   Electronically Signed: By: Franchot Gallo M.D. On: 06/06/2022 15:42    I personally reviewed the CT from 05/30/2022 and today's chest x-ray.  There is an anterior loculated pneumothorax in the setting of severe COPD and bullous emphysema.  I am not convinced there is dramatic change between the scout film on the CT and the current chest x-ray.   Impression: Rayjon Sean Rose is a 66 year old man with a past history of tobacco abuse, severe COPD, stage IIIa non-small cell carcinoma of the lung status post right upper lobectomy followed by adjuvant chemotherapy, hypertension, hyperlipidemia, CAD, mild, PAD, reflux, heart murmur, and anemia.  He had resection of a stage IIIa (T3, N1) non-small cell carcinoma in 2020.  He had postoperative adjuvant therapy.  He has been disease-free since then.   About a week ago he had sudden onset of shortness of breath associated with chest and neck and right shoulder pain.  He did not seek medical attention.  CT scan on 05/30/2022 showed a loculated right pneumothorax with pneumomediastinum.  He saw Dr. [name] on 06/03/2022 and was referred here.   His symptoms have been gradually improving until today when he noted he feels more difficulty breathing and his discomfort is gone from a 4 to a 5 with a pressure sensation in the right chest.   Right spontaneous pneumothorax in setting of severe COPD-while initially hoping to treat this conservatively, his worsening symptoms today are not indication for intervention.  He needs a pigtail catheter placed for treatment of the pneumothorax.  Given the severity of his  underlying disease, I think for safety reasons the tube should be put in by IR.  He currently is relatively stable with a normal blood pressure and no tachycardia.  He is short of breath but not in distress.  I have a call into IR but have not yet heard back from them.   He had a lot of questions about the length of time the tube would be required, the length of hospital stay, the possibility of a prolonged air leak, as well as other questions which really cannot be answered at this point.   Plan: Admit for chest tube placement We will discuss with interventional radiology to see if this can be done with CT-guidedance for safety reasons.       Melrose Nakayama, MD [facility] [phone]                Electronically signed by Melrose Nakayama, MD at 06/06/2022  5:17 PM

## 2022-06-06 NOTE — Progress Notes (Signed)
    [address]       [place] [address]             [phone]     HPI: [name] is sent for consultation regarding a pneumothorax seen on a CT of the chest.  Sean Rose is a 66-year-old man with a past history of tobacco abuse, severe COPD, stage IIIa non-small cell carcinoma of the lung status post right upper lobectomy followed by adjuvant chemotherapy, hypertension, hyperlipidemia, CAD, mild, PAD, reflux, heart murmur, and anemia.    He had a right upper lobectomy in 2020 for a T3, N1, stage IIIa non-small cell carcinoma.  Postoperatively he had a prolonged air leak and went home with a tube.  Took about a month for the airleak to completely resolved.  He had postoperative adjuvant chemotherapy and has been disease-free since then.  Recently he had a CT of the chest for surveillance.  Prior to the CT he developed acute onset of right-sided chest and shoulder pain and shortness of breath.  Initially this was about a 10 on 1-10 scale.  He did not seek medical attention.  He had a CT scan and it showed a loculated pneumothorax with pneumomediastinum and some slight shift of the mediastinum.  He saw Dr. Mohamed on 06/03/2022.  His symptoms were better but not resolved.  He was referred here for evaluation.  He says that that yesterday his symptoms had improved to the point where his discomfort and shortness of breath were about the 4.  However today he has felt a little more pressure and would describe it as a 5.  Not nearly as severe as it was initially.  Past Medical History:  Diagnosis Date   Allergy    Anemia    Anginal pain (HCC)    Blood transfusion without reported diagnosis    CAD (coronary artery disease)    CAP (community acquired pneumonia) 09/2016   COPD (chronic obstructive pulmonary disease) (HCC)    Emphysema of lung (HCC)    GERD (gastroesophageal reflux disease)    Heart murmur    "I was told I had one when I was a kid"   Hemorrhoids     History of anal fissures    "no surgeries" (10/30/2016)   Hyperlipidemia    Hypertension    lung ca dx'd 08/2018   with mets to bones in arms   Myocardial infarction (HCC)    Peripheral arterial disease (HCC)    status post right common iliac artery stenting back in 2007   Seasonal allergies    Tobacco abuse    Past Surgical History:  Procedure Laterality Date   ANKLE SURGERY Left    "rebuilt it"   ANTERIOR CRUCIATE LIGAMENT REPAIR Right    CARDIAC CATHETERIZATION  09/23/2008   Continued medical therapy - may need GI evaluation in addition.   CARDIAC CATHETERIZATION  10/28/2007   Medical therapy recommended.   CARDIAC CATHETERIZATION  11/18/2006   In-stent restenosis RCA  (50% distal edge, 80% segmental mid, and 50-60% segmental proximal). Successful cutting balloon atherectomy using a [id number] cutting balloon. 3 inflations with atherectomy performed on mid and proximal portions resulting in reduction of 80% mid in-stent restenosis to less than 20% residual and 50-60% segmental proximal to less than 20% residual without dissection.   CARDIAC CATHETERIZATION  02/26/2006   Severe stenosis in RCA. Stenting performed using IVUS. 3.5x20 Maverick balloon deployed at 6atm 34sec. Distal stent-a 4x28 Liberte stent-deployed 12atm 48sec,   12atm 31sec, 4atm 19sec. Mid stent-a 4x28 Liberte stent-deployed 14atm 45sec, 14atm 60sec, 14atm 44sec. Proximal stent-4x8 Liberte- 14atm 45sec,14atm 47sec, 16atm 43sec. Severely diseased segment then appeared TIMI-3 flow.   CARDIOVASCULAR STRESS TEST  11/17/2012   No significant ECG changes. Septal perfusion defect is new when complared to study from 2010. Abnormal myocardial perfusion imaging with a basal to mid perfusion suggestive of previous MI.   CAROTID DOPPLER  08/09/2011   Bilateral Bulb/Proximal ICA - demonstrated a mild amount of fibrous plaque without evidence of significant diameter reduction reduction or other vascular abnormality.   CHEST TUBE INSERTION Right  09/02/2018   Procedure: Chest Tube Insertion;  Surgeon: Icard, Bradley L, DO;  Location: [facility] OR;  Service: Thoracic;  Laterality: Right;   COLONOSCOPY     2003, 2014   CORONARY ANGIOPLASTY WITH STENT PLACEMENT     FEMORAL ARTERY STENT     INGUINAL HERNIA REPAIR Right    IR IMAGING GUIDED PORT INSERTION  11/13/2020   KNEE ARTHROSCOPY Right "multiple"   LAPAROSCOPIC APPENDECTOMY N/A 12/07/2018   Procedure: APPENDECTOMY LAPAROSCOPIC;  Surgeon: Blackman, Douglas, MD;  Location: [facility];  Service: General;  Laterality: N/A;   LOWER EXTREMITY ARTERIAL DOPPLER  01/31/2011   Bilateral ABIs-normal values with no suggestion of arterial insuff to the lower extremities at rest. 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 Right common iliac artery with an 8.0x18 mm Herculink stent deployed at 12 atm. Stenosis was reduced from 80% to 0% with brisk flow. b. I-cast stenting to left common iliac artery   PERIPHERAL VASCULAR CATHETERIZATION N/A 04/13/2015   Procedure: Lower Extremity Angiography;  Surgeon: Jonathan J Berry, MD; L-oCIA 75%, 40-50% L-EIA, R-CIA stent patent, s/p 8 mm x 38 mm ICast covered stent>>0% stenosis in L-oCIA      SHOULDER ARTHROSCOPY WITH ROTATOR CUFF REPAIR Right    TRANSTHORACIC ECHOCARDIOGRAM  11/26/2012   EF not noted. Aortic valve-sclerosis without stenosis, no regurgiation.    UPPER GASTROINTESTINAL ENDOSCOPY     US CAROTID DOPPLER BILATERAL ([facility] HX)  08/09/2011   Bilateral Bulb/Proximal ICAa demonstrated a mild amount of fibrous plaque without evidence of significant diameter reduction or any other vascular abnormality.   VIDEO ASSISTED THORACOSCOPY (VATS)/ LOBECTOMY Right 09/14/2018   Procedure: RIGHT VIDEO ASSISTED THORACOSCOPY (VATS)/ RIGHT UPPER LOBECTOMY;  Surgeon: ,  C, MD;  Location: [facility] OR;  Service: Thoracic;   Laterality: Right;   VIDEO BRONCHOSCOPY WITH ENDOBRONCHIAL NAVIGATION N/A 09/02/2018   Procedure: VIDEO BRONCHOSCOPY WITH ENDOBRONCHIAL NAVIGATION;  Surgeon: Icard, Bradley L, DO;  Location: [facility] OR;  Service: Thoracic;  Laterality: N/A;    Current Outpatient Medications  Medication Sig Dispense Refill   acetaminophen (TYLENOL) 500 MG tablet Take 1,000 mg by mouth every 6 (six) hours as needed for headache.     aspirin EC 81 MG tablet Take 81 mg by mouth daily.     clopidogrel (PLAVIX) 75 MG tablet TAKE 1 TABLET BY MOUTH EVERY DAY (Patient taking differently: Take 75 mg by mouth daily.) 90 tablet 3   esomeprazole (NEXIUM) 20 MG capsule Take 40 mg by mouth daily.     fexofenadine (ALLEGRA) 60 MG tablet Take 60 mg by mouth daily.     Fluticasone-Umeclidin-Vilant (TRELEGY ELLIPTA) 100-62.5-25 MCG/ACT AEPB Inhale 1 puff into the lungs daily. 60 each 3   folic acid (FOLVITE) 1 MG tablet   TAKE 1 TABLET(1 MG) BY MOUTH DAILY (Patient taking differently: Take 1 mg by mouth daily.) 30 tablet 4   Magnesium 500 MG TABS Take 1 tablet (500 mg total) by mouth 3 (three) times daily. 8 tablet 0   metoprolol succinate (TOPROL-XL) 25 MG 24 hr tablet TAKE 1 TABLET BY MOUTH DAILY 90 tablet 0   mirtazapine (REMERON) 15 MG tablet Take 1 tablet (15 mg total) by mouth at bedtime. 90 tablet 0   Multiple Vitamin (MULTIVITAMIN WITH MINERALS) TABS tablet Take 1 tablet by mouth daily.     rosuvastatin (CRESTOR) 5 MG tablet TAKE 1 TABLET(5 MG) BY MOUTH EVERY OTHER DAY (Patient taking differently: Take 5 mg by mouth every other day.) 45 tablet 3   amLODipine (NORVASC) 5 MG tablet Take 1 tablet (5 mg total) by mouth daily. 180 tablet 3   No current facility-administered medications for this visit.   Facility-Administered Medications Ordered in Other Visits  Medication Dose Route Frequency Provider Last Rate Last Admin   diphenhydrAMINE (BENADRYL) 25 mg capsule             Physical Exam BP 130/86   Pulse 82   Resp 20   Ht  5' 7.5" (1.715 m)   Wt 102 lb (46.3 kg)   SpO2 99% Comment: RA  BMI 15.72 kg/m  66 year old frail appearing man in wheelchair  No acute distress Thin Alert and oriented x3 with no focal deficits Lungs with diminished breath sounds bilaterally Cardiac regular rate and rhythm No subcutaneous emphysema Trachea midline  Diagnostic Tests:  CT CHEST, ABDOMEN AND PELVIS WITHOUT CONTRAST   TECHNIQUE: Multidetector CT imaging of the chest, abdomen and pelvis was performed following the standard protocol without IV contrast.   RADIATION DOSE REDUCTION: This exam was performed according to the departmental dose-optimization program which includes automated exposure control, adjustment of the mA and/or kV according to patient size and/or use of iterative reconstruction technique.   COMPARISON:  01/25/2022   FINDINGS: CT CHEST FINDINGS   Cardiovascular: Heart size appears within normal limits. Aortic atherosclerosis. Coronary artery calcifications. No pericardial effusion.   Mediastinum/Nodes: No enlarged supraclavicular, axillary, or mediastinal lymph nodes. Hilar lymph nodes are suboptimally evaluated due to lack of IV contrast. New pneumomediastinum is identified with gas tracking into bilateral supraclavicular regions and right lower neck. Pneumomediastinum also extends into the ventral lower chest.   Lungs/Pleura: Unchanged appearance of small left pleural effusion with overlying compressive type atelectasis. Right pleural effusion which appears small to moderate in size may be partially locule and appears similar in volume to the previous exam. Overlying area of chronic appearing atelectasis containing some calcifications noted, image 28/2. Postoperative change from previous right upper lobe resection identified.   There is severe changes of bullous emphysema. There is a new anterior pneumothorax which overlies the right middle lobe and right lobe measuring 8.3 x 5.1 by  15.8 cm, image 63/6 and image 87/4. This contains some internal areas of septation.   Unchanged appearance of nodular architectural distortion and scarring identified along the posterior and lateral subpleural left upper lobe, image 28/4. No new pulmonary nodule or mass identified.   Musculoskeletal: Mixed lytic and sclerotic lesion involving the left scapula is again noted in appears similar to the previous exam, image 15/4. Stable sclerosis associated with the lateral aspect of the left third rib which may reflect a underlying remote rib fracture.   CT ABDOMEN PELVIS FINDINGS   Hepatobiliary: No focal liver abnormality is seen. No gallstones, gallbladder  wall thickening, or biliary dilatation.   Pancreas: Unremarkable. No pancreatic ductal dilatation or surrounding inflammatory changes.   Spleen: Normal in size without focal abnormality.   Adrenals/Urinary Tract: Normal adrenal glands. No suspicious kidney mass. No nephrolithiasis or hydronephrosis. Urinary bladder is unremarkable.   Stomach/Bowel: Wall thickening involving the stomach is again noted and appears similar to the previous exam. No bowel wall thickening, inflammation, or distension.   Vascular/Lymphatic: Aortic atherosclerosis. No signs of abdominopelvic adenopathy.   Reproductive: Prostate is unremarkable.   Other: No abdominal wall hernia or abnormality. No abdominopelvic ascites.   Musculoskeletal: No acute or significant osseous findings.   IMPRESSION: 1. New anterior pneumothorax overlies the right middle lobe and right lobe measuring 8.3 x 5.1 by 15.8 cm (volume = 350 cm^3). This contains some internal areas of septation, which suggests this may represent a chronic finding. There is also pneumomediastinum with gas tracking into bilateral supraclavicular regions and right lower neck. Pneumomediastinum also extends into the ventral lower chest. 2. Unchanged appearance of bilateral pleural effusions  with overlying compressive type atelectasis. No specific findings identified to suggest locally recurrent tumor within the chest or new sites of metastatic disease within the chest, abdomen or pelvis. 3. Stable appearance of treated metastasis involving the left scapula. 4. Unchanged appearance of chronic gastric wall thickening. 5. Aortic Atherosclerosis (ICD10-I70.0) and Emphysema (ICD10-J43.9).   Critical Value/emergent results were called by telephone at the time of interpretation on 06/01/2022 at 12:41 pm to provider Zola Button MD, who verbally acknowledged these results.     Electronically Signed   By: Kerby Moors M.D.   On: 06/01/2022 12:43 ADDENDUM REPORT: 06/06/2022 15:54   ADDENDUM: Findings discussed with [name]. The most recent chest x-ray is from March 13, 2022, not May 30, 2022 as dictated above. The CT was on 05/30/2022. There does appear to be more midline shift to the left on the current chest x-ray compared to the recent CT.     Electronically Signed   By: Franchot Gallo M.D.   On: 06/06/2022 15:54    Addended by Barnet Glasgow, MD on 06/06/2022  3:56 PM    Study Result  Narrative & Impression  CLINICAL DATA:  Follow-up pneumothorax.  Lung cancer   EXAM: CHEST - 2 VIEW   COMPARISON:  Chest 05/30/2022.  CT chest 05/30/2022   FINDINGS: Heart and mediastinal structures are displaced to the left which is new since the prior study. This could be due to tension type pneumothorax. Pleural line not definitely seen on this view. Surgical staple line in the right apex. The pneumothorax was loculated anteriorly on CT and may have expanded in the interval. Small right pleural effusion similar to the prior study.   Small left effusion and left lower lobe atelectasis improved. Negative for heart failure or edema.   Port-A-Cath tip lower SVC unchanged   IMPRESSION: 1. New displacement of the heart and mediastinal structures to the left. This  could be due to tension type pneumothorax which is loculated anteriorly. Consider follow-up chest CT. 2. Small right pleural effusion unchanged. 3. Improvement in left lower lobe atelectasis and effusion. 4. Call report pending   Electronically Signed: By: Franchot Gallo M.D. On: 06/06/2022 15:42   I personally reviewed the CT from 05/30/2022 and today's chest x-ray.  There is an anterior loculated pneumothorax in the setting of severe COPD and bullous emphysema.  I am not convinced there is dramatic change between the scout film on the CT and the  current chest x-ray.  Impression: Sean Rose is a 66-year-old man with a past history of tobacco abuse, severe COPD, stage IIIa non-small cell carcinoma of the lung status post right upper lobectomy followed by adjuvant chemotherapy, hypertension, hyperlipidemia, CAD, mild, PAD, reflux, heart murmur, and anemia.  He had resection of a stage IIIa (T3, N1) non-small cell carcinoma in 2020.  He had postoperative adjuvant therapy.  He has been disease-free since then.  About a week ago he had sudden onset of shortness of breath associated with chest and neck and right shoulder pain.  He did not seek medical attention.  CT scan on 05/30/2022 showed a loculated right pneumothorax with pneumomediastinum.  He saw Dr. Mohamed on 06/03/2022 and was referred here.  His symptoms have been gradually improving until today when he noted he feels more difficulty breathing and his discomfort is gone from a 4 to a 5 with a pressure sensation in the right chest.  Right spontaneous pneumothorax in setting of severe COPD-while initially hoping to treat this conservatively, his worsening symptoms today are not indication for intervention.  He needs a pigtail catheter placed for treatment of the pneumothorax.  Given the severity of his underlying disease, I think for safety reasons the tube should be put in by IR.  He currently is relatively stable with a normal blood pressure  and no tachycardia.  He is short of breath but not in distress.  I have a call into IR but have not yet heard back from them.  He had a lot of questions about the length of time the tube would be required, the length of hospital stay, the possibility of a prolonged air leak, as well as other questions which really cannot be answered at this point.  Plan: Admit for chest tube placement We will discuss with interventional radiology to see if this can be done with CT-guidedance for safety reasons.     C , MD [facility] [phone]     

## 2022-06-07 ENCOUNTER — Inpatient Hospital Stay (HOSPITAL_COMMUNITY): Payer: BC Managed Care – PPO

## 2022-06-07 ENCOUNTER — Ambulatory Visit: Payer: BC Managed Care – PPO | Admitting: Physical Therapy

## 2022-06-07 ENCOUNTER — Encounter (HOSPITAL_COMMUNITY): Payer: Self-pay | Admitting: Thoracic Surgery (Cardiothoracic Vascular Surgery)

## 2022-06-07 DIAGNOSIS — J939 Pneumothorax, unspecified: Secondary | ICD-10-CM

## 2022-06-07 MED ORDER — CHLORHEXIDINE GLUCONATE CLOTH 2 % EX PADS
6.0000 | MEDICATED_PAD | Freq: Every day | CUTANEOUS | Status: DC
  Administered 2022-06-07 – 2022-06-10 (×3): 6 via TOPICAL

## 2022-06-07 MED ORDER — LIDOCAINE-EPINEPHRINE 1 %-1:100000 IJ SOLN
20.0000 mL | Freq: Once | INTRAMUSCULAR | Status: AC
  Administered 2022-06-07: 20 mL via INTRADERMAL

## 2022-06-07 MED ORDER — MIDAZOLAM HCL 2 MG/2ML IJ SOLN
INTRAMUSCULAR | Status: AC
  Filled 2022-06-07: qty 2

## 2022-06-07 MED ORDER — OXYCODONE HCL 5 MG PO TABS
5.0000 mg | ORAL_TABLET | ORAL | Status: DC | PRN
  Administered 2022-06-07 – 2022-06-09 (×4): 5 mg via ORAL
  Filled 2022-06-07 (×5): qty 1

## 2022-06-07 MED ORDER — FENTANYL CITRATE (PF) 100 MCG/2ML IJ SOLN
INTRAMUSCULAR | Status: AC | PRN
  Administered 2022-06-07 (×2): 25 ug via INTRAVENOUS

## 2022-06-07 MED ORDER — FENTANYL CITRATE (PF) 100 MCG/2ML IJ SOLN
INTRAMUSCULAR | Status: AC
  Filled 2022-06-07: qty 2

## 2022-06-07 MED ORDER — MIDAZOLAM HCL 2 MG/2ML IJ SOLN
INTRAMUSCULAR | Status: AC | PRN
  Administered 2022-06-07 (×2): .5 mg via INTRAVENOUS

## 2022-06-07 NOTE — Progress Notes (Signed)
Pt came back to rm 2 from IR. Reinitiated tete.VSS.  initiated chest tube -20cm per order.   Lavenia Atlas, RN

## 2022-06-07 NOTE — Progress Notes (Signed)
Patient arrived to 4E02 via wheelchair, direct admit from home, accompanied by wife. CHG bath completed, connected to tele and CCMD notified. Oriented to patient to room and call bell system. [name] paged and notified of patient's arrival. Plan of care continues.

## 2022-06-07 NOTE — Procedures (Signed)
Pre procedural Dx: Right sided pneumothroax Post procedural Dx: Same  Technically successful CT guided placed of a 12 Fr drainage catheter placement into the right pleural space. Chest tube connected to a pleur-vac device.   EBL: Trace Complications: None immediate  Ronny Bacon, MD Pager #: [phone]

## 2022-06-07 NOTE — Progress Notes (Addendum)
      [address]       [place] [address]             [phone]       Subjective:   No new complaints.  Continues to have pain along right clavicle/scapular area.. Denies shortness of breath  Objective: Vital signs in last 24 hours: Temp:  [97.7 F (36.5 C)-98.4 F (36.9 C)] 98.4 F (36.9 C) (11/10 0732) Pulse Rate:  [71-82] 73 (11/10 0732) Cardiac Rhythm: Normal sinus rhythm (11/09 1954) Resp:  [17-20] 17 (11/10 0329) BP: (113-153)/(79-91) 113/91 (11/10 0732) SpO2:  [97 %-100 %] 99 % (11/10 0329) Weight:  [46.1 kg-46.3 kg] 46.1 kg (11/09 1945)  Intake/Output from previous day: 11/09 0701 - 11/10 0700 In: 290 [P.O.:240; I.V.:50] Out: 700 [Urine:700] Intake/Output this shift: Total I/O In: -  Out: 250 [Urine:250]  General appearance: alert, cooperative, and no distress Heart: regular rate and rhythm Lungs: diminished breath sounds on right Abdomen: soft, non-tender; bowel sounds normal; no masses,  no organomegaly  Lab Results: No results for input(s): "WBC", "HGB", "HCT", "PLT" in the last 72 hours. BMET: No results for input(s): "NA", "K", "CL", "CO2", "GLUCOSE", "BUN", "CREATININE", "CALCIUM" in the last 72 hours.  PT/INR: No results for input(s): "LABPROT", "INR" in the last 72 hours. ABG    Component Value Date/Time   PHART 7.474 (H) 09/15/2018 0750   HCO3 25.0 09/15/2018 0750   TCO2 20 (L) 09/14/2018 1547   ACIDBASEDEF 6.0 (H) 09/14/2018 1547   O2SAT 94.1 09/15/2018 0750   CBG (last 3)  No results for input(s): "GLUCAP" in the last 72 hours.  Assessment/Plan:  Right Side pneumothorax with evidence of left shift in setting of lung cancer and severe COP--- patient is tolerating fairly well... he will require CT placement, I have placed IR consult for possible CT guided CT placement.Marland Kitchen as we feel this will be safest placement option CV- hemodynamically stable on home Toprol XL, Norvasc, Plavix DVT prophylaxis.Marland Kitchen not currently on  Lovenox will discuss with [name]     LOS: 1 day   Ellwood Handler, PA-C 06/07/2022  Patient seen and examined, agree with above D/w IR- they will place tube under CT guidance today  Remo Lipps C. Roxan Hockey, MD [facility] [phone]

## 2022-06-07 NOTE — Consult Note (Addendum)
Chief Complaint: Patient was seen in consultation today for right pneumothorax at the request of Hendrickson,Steven C  Referring Physician(s): Hamilton C  Supervising Physician: [name]  Patient Status: [facility] - In-pt  History of Present Illness: Sean Rose is a 66 y.o. male with PMH significant for tobacco abuse, severe COPD, non-small cell carcinoma of lung s/p right upper lobectomy. Pt underwent upper right lobectomy 2020 for non-small cell carcinoma. He was treated with chemotherapy and has been without disease. Pt had recent surveillance CT chest scheduled. Prior to exam, he developed acute onset right chest and shoulder pain 10/10, with shortness of breath. He did not seek medical attention at that time. The scheduled CT scan showed loculated pneumothorax with pneumomediastinum and slight shift of the mediastinum. He was seen by Dr. [name] 06/03/22 and reported symptoms were better but not resolved. Pt was referred to cardiac and thoracic surgery who then admitted patient for treatment. Pt was referred to IR for right chest tube placement. Dr. Serafina Royals reviewed imaging and approved pt for right chest tube.   Pt endorses R shoulder pain and DOE.  He denies fever, chills, chest pain.  He is NPO per order.   Past Medical History:  Diagnosis Date   Allergy    Anemia    Anginal pain ([place])    Blood transfusion without reported diagnosis    CAD (coronary artery disease)    CAP (community acquired pneumonia) 09/2016   COPD (chronic obstructive pulmonary disease) (HCC)    Emphysema of lung (HCC)    GERD (gastroesophageal reflux disease)    Heart murmur    "I was told I had one when I was a kid"   Hemorrhoids    History of anal fissures    "no surgeries" (10/30/2016)   Hyperlipidemia    Hypertension    lung ca dx'd 08/2018   with mets to bones in arms   Myocardial infarction [facility])    Peripheral arterial disease ([place])    status post right common iliac artery  stenting back in 2007   Seasonal allergies    Tobacco abuse     Past Surgical History:  Procedure Laterality Date   ANKLE SURGERY Left    "rebuilt it"   ANTERIOR CRUCIATE LIGAMENT REPAIR Right    CARDIAC CATHETERIZATION  09/23/2008   Continued medical therapy - may need GI evaluation in addition.   CARDIAC CATHETERIZATION  10/28/2007   Medical therapy recommended.   CARDIAC CATHETERIZATION  11/18/2006   In-stent restenosis RCA  (50% distal edge, 80% segmental mid, and 50-60% segmental proximal). Successful cutting balloon atherectomy using a [id number] cutting balloon. 3 inflations with atherectomy performed on mid and proximal portions resulting in reduction of 80% mid in-stent restenosis to less than 20% residual and 50-60% segmental proximal to less than 20% residual without dissection.   CARDIAC CATHETERIZATION  02/26/2006   Severe stenosis in RCA. Stenting performed using IVUS. 3.5x20 Maverick balloon deployed at [employer]. Distal stent-a 4x28 Liberte stent-deployed 12atm 48sec, 12atm 31sec, 4atm 19sec. Mid stent-a 4x28 Liberte stent-deployed 14atm 45sec, 14atm 60sec, 14atm 44sec. Proximal stent-4x8 Liberte- 14atm 45sec,14atm 47sec, 16atm 43sec. Severely diseased segment then appeared TIMI-3 flow.   CARDIOVASCULAR STRESS TEST  11/17/2012   No significant ECG changes. Septal perfusion defect is new when complared to study from 2010. Abnormal myocardial perfusion imaging with a basal to mid perfusion suggestive of previous MI.   CAROTID DOPPLER  08/09/2011   Bilateral Bulb/Proximal ICA - demonstrated a mild amount of  fibrous plaque without evidence of significant diameter reduction reduction or other vascular abnormality.   CHEST TUBE INSERTION Right 09/02/2018   Procedure: Chest Tube Insertion;  Surgeon: Garner Nash, DO;  Location: [place];  Service: Thoracic;  Laterality: Right;   COLONOSCOPY     2003, 2014   CORONARY ANGIOPLASTY WITH STENT PLACEMENT     FEMORAL ARTERY STENT     INGUINAL  HERNIA REPAIR Right    IR IMAGING GUIDED PORT INSERTION  11/13/2020   KNEE ARTHROSCOPY Right "multiple"   LAPAROSCOPIC APPENDECTOMY N/A 12/07/2018   Procedure: APPENDECTOMY LAPAROSCOPIC;  Surgeon: Coralie Keens, MD;  Location: [facility];  Service: General;  Laterality: N/A;   LOWER EXTREMITY ARTERIAL DOPPLER  01/31/2011   Bilateral ABIs-normal values with no suggestion of arterial insuff to the lower extremities at rest. Right CIA stent-mild amount of nonhemodynamically significant plaque is noted throughout   [place]   "bone-eating tumor"   PERCUTANEOUS STENT INTERVENTION  04/04/2006 & 04/13/2015   a. Right common iliac artery with an 8.0x18 mm Herculink stent deployed at 12 atm. Stenosis was reduced from 80% to 0% with brisk flow. b. I-cast stenting to left common iliac artery   PERIPHERAL VASCULAR CATHETERIZATION N/A 04/13/2015   Procedure: Lower Extremity Angiography;  Surgeon: Lorretta Harp, MD; L-oCIA 75%, 40-50% L-EIA, R-CIA stent patent, s/p 8 mm x 38 mm ICast covered stent>>0% stenosis in [place] Right    TRANSTHORACIC ECHOCARDIOGRAM  11/26/2012   EF not noted. Aortic valve-sclerosis without stenosis, no regurgiation.    UPPER GASTROINTESTINAL ENDOSCOPY     US CAROTID DOPPLER BILATERAL ([place] [facility])  08/09/2011   Bilateral Bulb/Proximal ICAa demonstrated a mild amount of fibrous plaque without evidence of significant diameter reduction or any other vascular abnormality.   VIDEO ASSISTED THORACOSCOPY (VATS)/ LOBECTOMY Right 09/14/2018   Procedure: RIGHT VIDEO ASSISTED THORACOSCOPY (VATS)/ RIGHT UPPER LOBECTOMY;  Surgeon: Melrose Nakayama, MD;  Location: [place];  Service: Thoracic;  Laterality: Right;   VIDEO BRONCHOSCOPY WITH ENDOBRONCHIAL NAVIGATION N/A 09/02/2018   Procedure: VIDEO BRONCHOSCOPY WITH ENDOBRONCHIAL NAVIGATION;  Surgeon: Garner Nash, DO;  Location: [facility];  Service: Thoracic;  Laterality: N/A;     Allergies: Compazine [prochlorperazine]  Medications: Prior to Admission medications   Medication Sig Start Date End Date Taking? Authorizing Provider  acetaminophen (TYLENOL) 500 MG tablet Take 1,000 mg by mouth every 6 (six) hours as needed for headache.   Yes [provider]  amLODipine (NORVASC) 5 MG tablet Take 1 tablet (5 mg total) by mouth daily. 04/06/21 06/06/22 Yes Warren Lacy, PA-C  aspirin EC 81 MG tablet Take 81 mg by mouth daily.   Yes [provider]  clopidogrel (PLAVIX) 75 MG tablet TAKE 1 TABLET BY MOUTH EVERY DAY Patient taking differently: Take 75 mg by mouth daily. 09/24/21  Yes Lorretta Harp, MD  esomeprazole (NEXIUM) 20 MG capsule Take 40 mg by mouth daily.   Yes [provider]  fexofenadine (ALLEGRA) 60 MG tablet Take 60 mg by mouth every evening.   Yes [provider]  Fluticasone-Umeclidin-Vilant (TRELEGY ELLIPTA) 100-62.5-25 MCG/ACT AEPB Inhale 1 puff into the lungs daily. 04/30/22  Yes Nafziger, Tommi Rumps, NP  folic acid (FOLVITE) 1 MG tablet TAKE 1 TABLET(1 MG) BY MOUTH DAILY Patient taking differently: Take 1 mg by mouth daily. 01/21/22  Yes Heilingoetter, Cassandra L, PA-C  Magnesium 500 MG TABS Take 1 tablet (500 mg total)  by mouth 3 (three) times daily. Patient taking differently: Take 500 mg by mouth in the morning and at bedtime. 03/02/21  Yes Nita Sells, MD  metoprolol succinate (TOPROL-XL) 25 MG 24 hr tablet TAKE 1 TABLET BY MOUTH DAILY 05/22/22  Yes Lorretta Harp, MD  mirtazapine (REMERON) 15 MG tablet Take 1 tablet (15 mg total) by mouth at bedtime. 03/27/22  Yes Nafziger, Tommi Rumps, NP  Multiple Vitamin (MULTIVITAMIN WITH MINERALS) TABS tablet Take 1 tablet by mouth daily.   Yes [provider]  rosuvastatin (CRESTOR) 5 MG tablet TAKE 1 TABLET(5 MG) BY MOUTH EVERY OTHER DAY Patient taking differently: Take 5 mg by mouth every other day. 07/02/21  Yes Lorretta Harp, MD     Family History   Problem Relation Age of Onset   Colon cancer Mother    Heart disease Father    Heart disease Paternal Grandfather    Rectal cancer Other    Esophageal cancer Neg Hx    Stomach cancer Neg Hx     Social History   Socioeconomic History   Marital status: Married    Spouse name: Not on file   Number of children: 2   Years of education: Not on file   Highest education level: Not on file  Occupational History   Occupation: retired  Tobacco Use   Smoking status: Former    Packs/day: 0.25    Years: 38.00    Total pack years: 9.50    Types: Cigarettes    Quit date: 10/19/2018    Years since quitting: 3.6   Smokeless tobacco: Never   Tobacco comments:    pack per day 12.17.19  Vaping Use   Vaping Use: Former  Substance and Sexual Activity   Alcohol use: Not Currently    Comment: socially   Drug use: No   Sexual activity: Yes  Other Topics Concern   Not on file  Social History Narrative   Patient is married with 2 children   Former smoker no tobacco now no drug use occasional/social alcohol   Social Determinants of Radio broadcast assistant Strain: Not on file  Food Insecurity: No Food Insecurity (06/06/2022)   Hunger Vital Sign    Worried About Running Out of Food in the Last Year: Never true    [place] in the Last Year: Never true  Transportation Needs: No Transportation Needs (06/06/2022)   PRAPARE - Hydrologist (Medical): No    Lack of Transportation (Non-Medical): No  Physical Activity: Not on file  Stress: Not on file  Social Connections: Not on file    Review of Systems: A 12 point ROS discussed and pertinent positives are indicated in the HPI above.  All other systems are negative.  Review of Systems  Constitutional:  Positive for activity change and appetite change. Negative for chills and fever.  Respiratory:  Positive for shortness of breath.   Cardiovascular:  Negative for chest pain and leg swelling.   Gastrointestinal:  Negative for abdominal pain, nausea and vomiting.  Musculoskeletal:  Positive for myalgias.       Right shoulder pain  Neurological:  Negative for dizziness and headaches.    Vital Signs: BP (!) 113/91 (BP Location: Right Arm)   Pulse 73   Temp 98.4 F (36.9 C) (Oral)   Resp 17   Ht _0  (1.727 m)   Wt 101 lb 10.1 oz (46.1 kg)   SpO2 99%   BMI  15.45 kg/m    Physical Exam Vitals reviewed.  Constitutional:      General: He is not in acute distress.    Appearance: Normal appearance. He is ill-appearing.  HENT:     Head: Normocephalic and atraumatic.     Mouth/Throat:     Mouth: Mucous membranes are dry.     Pharynx: Oropharynx is clear.  Eyes:     Extraocular Movements: Extraocular movements intact.     Pupils: Pupils are equal, round, and reactive to light.  Cardiovascular:     Rate and Rhythm: Normal rate and regular rhythm.     Pulses: Normal pulses.     Heart sounds: Normal heart sounds. No murmur heard. Pulmonary:     Effort: Pulmonary effort is normal. No respiratory distress.     Comments: Diminished in all fields Abdominal:     General: Bowel sounds are normal. There is no distension.     Palpations: Abdomen is soft.     Tenderness: There is no abdominal tenderness. There is no guarding.  Musculoskeletal:     Right lower leg: No edema.     Left lower leg: No edema.  Skin:    General: Skin is warm and dry.     Comments: R chest port in place  Neurological:     Mental Status: He is alert and oriented to person, place, and time.  Psychiatric:        Mood and Affect: Mood normal.        Behavior: Behavior normal.        Thought Content: Thought content normal.        Judgment: Judgment normal.     Imaging: DG Chest Port 1 View  Result Date: 06/07/2022 CLINICAL DATA:  66 year old male with non-small cell lung cancer. Loculated pneumothorax, pneumomediastinum on restaging CT earlier this month. EXAM: PORTABLE CHEST 1 VIEW COMPARISON:   Chest radiographs 06/06/2022 and earlier. FINDINGS: Portable AP upright view at 0622 hours. Emphysema with architectural distortion, lung volumes are stable. Stable right chest power port. Evidence of regressed pneumomediastinum since 05/30/2022. No pleural edge visible now however, the central junction line remains displaced to the left. Mild if any leftward mediastinal shift is stable. Stable cardiac size and mediastinal contours. No pulmonary edema. Negative visible bowel gas.  Stable visualized osseous structures. IMPRESSION: 1. Evidence of ongoing loculated anterior pneumothorax shifting the pulmonary junction line to the left. Mild if any mediastinal mass effect is stable from yesterday. 2. Underlying advanced chronic lung disease, Emphysema. No new cardiopulmonary abnormality. Electronically Signed   By: Genevie Ann M.D.   On: 06/07/2022 06:35   DG Chest 2 View  Addendum Date: 06/06/2022   ADDENDUM REPORT: 06/06/2022 15:54 ADDENDUM: Findings discussed with [name]. The most recent chest x-ray is from March 13, 2022, not May 30, 2022 as dictated above. The CT was on 05/30/2022. There does appear to be more midline shift to the left on the current chest x-ray compared to the recent CT. Electronically Signed   By: Franchot Gallo M.D.   On: 06/06/2022 15:54   Result Date: 06/06/2022 CLINICAL DATA:  Follow-up pneumothorax.  Lung cancer EXAM: CHEST - 2 VIEW COMPARISON:  Chest 05/30/2022.  CT chest 05/30/2022 FINDINGS: Heart and mediastinal structures are displaced to the left which is new since the prior study. This could be due to tension type pneumothorax. Pleural line not definitely seen on this view. Surgical staple line in the right apex. The pneumothorax was loculated anteriorly on  CT and may have expanded in the interval. Small right pleural effusion similar to the prior study. Small left effusion and left lower lobe atelectasis improved. Negative for heart failure or edema. Port-A-Cath tip  lower SVC unchanged IMPRESSION: 1. New displacement of the heart and mediastinal structures to the left. This could be due to tension type pneumothorax which is loculated anteriorly. Consider follow-up chest CT. 2. Small right pleural effusion unchanged. 3. Improvement in left lower lobe atelectasis and effusion. 4. Call report pending Electronically Signed: By: Franchot Gallo M.D. On: 06/06/2022 15:42   CT Chest Wo Contrast  Result Date: 06/01/2022 CLINICAL DATA:  Restaging non-small cell lung cancer. * Tracking Code: BO * EXAM: CT CHEST, ABDOMEN AND PELVIS WITHOUT CONTRAST TECHNIQUE: Multidetector CT imaging of the chest, abdomen and pelvis was performed following the standard protocol without IV contrast. RADIATION DOSE REDUCTION: This exam was performed according to the departmental dose-optimization program which includes automated exposure control, adjustment of the mA and/or kV according to patient size and/or use of iterative reconstruction technique. COMPARISON:  01/25/2022 FINDINGS: CT CHEST FINDINGS Cardiovascular: Heart size appears within normal limits. Aortic atherosclerosis. Coronary artery calcifications. No pericardial effusion. Mediastinum/Nodes: No enlarged supraclavicular, axillary, or mediastinal lymph nodes. Hilar lymph nodes are suboptimally evaluated due to lack of IV contrast. New pneumomediastinum is identified with gas tracking into bilateral supraclavicular regions and right lower neck. Pneumomediastinum also extends into the ventral lower chest. Lungs/Pleura: Unchanged appearance of small left pleural effusion with overlying compressive type atelectasis. Right pleural effusion which appears small to moderate in size may be partially locule and appears similar in volume to the previous exam. Overlying area of chronic appearing atelectasis containing some calcifications noted, image 28/2. Postoperative change from previous right upper lobe resection identified. There is severe changes  of bullous emphysema. There is a new anterior pneumothorax which overlies the right middle lobe and right lobe measuring 8.3 x 5.1 by 15.8 cm, image 63/6 and image 87/4. This contains some internal areas of septation. Unchanged appearance of nodular architectural distortion and scarring identified along the posterior and lateral subpleural left upper lobe, image 28/4. No new pulmonary nodule or mass identified. Musculoskeletal: Mixed lytic and sclerotic lesion involving the left scapula is again noted in appears similar to the previous exam, image 15/4. Stable sclerosis associated with the lateral aspect of the left third rib which may reflect a underlying remote rib fracture. CT ABDOMEN PELVIS FINDINGS Hepatobiliary: No focal liver abnormality is seen. No gallstones, gallbladder wall thickening, or biliary dilatation. Pancreas: Unremarkable. No pancreatic ductal dilatation or surrounding inflammatory changes. Spleen: Normal in size without focal abnormality. Adrenals/Urinary Tract: Normal adrenal glands. No suspicious kidney mass. No nephrolithiasis or hydronephrosis. Urinary bladder is unremarkable. Stomach/Bowel: Wall thickening involving the stomach is again noted and appears similar to the previous exam. No bowel wall thickening, inflammation, or distension. Vascular/Lymphatic: Aortic atherosclerosis. No signs of abdominopelvic adenopathy. Reproductive: Prostate is unremarkable. Other: No abdominal wall hernia or abnormality. No abdominopelvic ascites. Musculoskeletal: No acute or significant osseous findings. IMPRESSION: 1. New anterior pneumothorax overlies the right middle lobe and right lobe measuring 8.3 x 5.1 by 15.8 cm (volume = 350 cm^3). This contains some internal areas of septation, which suggests this may represent a chronic finding. There is also pneumomediastinum with gas tracking into bilateral supraclavicular regions and right lower neck. Pneumomediastinum also extends into the ventral lower  chest. 2. Unchanged appearance of bilateral pleural effusions with overlying compressive type atelectasis. No specific findings identified to suggest  locally recurrent tumor within the chest or new sites of metastatic disease within the chest, abdomen or pelvis. 3. Stable appearance of treated metastasis involving the left scapula. 4. Unchanged appearance of chronic gastric wall thickening. 5. Aortic Atherosclerosis (ICD10-I70.0) and Emphysema (ICD10-J43.9). Critical Value/emergent results were called by telephone at the time of interpretation on 06/01/2022 at 12:41 pm to provider Zola Button MD, who verbally acknowledged these results. Electronically Signed   By: Kerby Moors M.D.   On: 06/01/2022 12:43   CT Abdomen Pelvis Wo Contrast  Result Date: 06/01/2022 CLINICAL DATA:  Restaging non-small cell lung cancer. * Tracking Code: BO * EXAM: CT CHEST, ABDOMEN AND PELVIS WITHOUT CONTRAST TECHNIQUE: Multidetector CT imaging of the chest, abdomen and pelvis was performed following the standard protocol without IV contrast. RADIATION DOSE REDUCTION: This exam was performed according to the departmental dose-optimization program which includes automated exposure control, adjustment of the mA and/or kV according to patient size and/or use of iterative reconstruction technique. COMPARISON:  01/25/2022 FINDINGS: CT CHEST FINDINGS Cardiovascular: Heart size appears within normal limits. Aortic atherosclerosis. Coronary artery calcifications. No pericardial effusion. Mediastinum/Nodes: No enlarged supraclavicular, axillary, or mediastinal lymph nodes. Hilar lymph nodes are suboptimally evaluated due to lack of IV contrast. New pneumomediastinum is identified with gas tracking into bilateral supraclavicular regions and right lower neck. Pneumomediastinum also extends into the ventral lower chest. Lungs/Pleura: Unchanged appearance of small left pleural effusion with overlying compressive type atelectasis. Right pleural  effusion which appears small to moderate in size may be partially locule and appears similar in volume to the previous exam. Overlying area of chronic appearing atelectasis containing some calcifications noted, image 28/2. Postoperative change from previous right upper lobe resection identified. There is severe changes of bullous emphysema. There is a new anterior pneumothorax which overlies the right middle lobe and right lobe measuring 8.3 x 5.1 by 15.8 cm, image 63/6 and image 87/4. This contains some internal areas of septation. Unchanged appearance of nodular architectural distortion and scarring identified along the posterior and lateral subpleural left upper lobe, image 28/4. No new pulmonary nodule or mass identified. Musculoskeletal: Mixed lytic and sclerotic lesion involving the left scapula is again noted in appears similar to the previous exam, image 15/4. Stable sclerosis associated with the lateral aspect of the left third rib which may reflect a underlying remote rib fracture. CT ABDOMEN PELVIS FINDINGS Hepatobiliary: No focal liver abnormality is seen. No gallstones, gallbladder wall thickening, or biliary dilatation. Pancreas: Unremarkable. No pancreatic ductal dilatation or surrounding inflammatory changes. Spleen: Normal in size without focal abnormality. Adrenals/Urinary Tract: Normal adrenal glands. No suspicious kidney mass. No nephrolithiasis or hydronephrosis. Urinary bladder is unremarkable. Stomach/Bowel: Wall thickening involving the stomach is again noted and appears similar to the previous exam. No bowel wall thickening, inflammation, or distension. Vascular/Lymphatic: Aortic atherosclerosis. No signs of abdominopelvic adenopathy. Reproductive: Prostate is unremarkable. Other: No abdominal wall hernia or abnormality. No abdominopelvic ascites. Musculoskeletal: No acute or significant osseous findings. IMPRESSION: 1. New anterior pneumothorax overlies the right middle lobe and right lobe  measuring 8.3 x 5.1 by 15.8 cm (volume = 350 cm^3). This contains some internal areas of septation, which suggests this may represent a chronic finding. There is also pneumomediastinum with gas tracking into bilateral supraclavicular regions and right lower neck. Pneumomediastinum also extends into the ventral lower chest. 2. Unchanged appearance of bilateral pleural effusions with overlying compressive type atelectasis. No specific findings identified to suggest locally recurrent tumor within the chest or new sites of metastatic  disease within the chest, abdomen or pelvis. 3. Stable appearance of treated metastasis involving the left scapula. 4. Unchanged appearance of chronic gastric wall thickening. 5. Aortic Atherosclerosis (ICD10-I70.0) and Emphysema (ICD10-J43.9). Critical Value/emergent results were called by telephone at the time of interpretation on 06/01/2022 at 12:41 pm to provider Zola Button MD, who verbally acknowledged these results. Electronically Signed   By: Kerby Moors M.D.   On: 06/01/2022 12:43    Labs:  CBC: Recent Labs    01/25/22 1006 03/13/22 1755 03/14/22 0303 05/30/22 1042  WBC 10.1 11.0* 8.5 9.1  HGB 9.2* 9.6* 8.6* 10.2*  HCT 26.0* 26.4* 24.7* 28.1*  PLT 523* 534* 479* 477*    COAGS: No results for input(s): "INR", "APTT" in the last 8760 hours.  BMP: Recent Labs    01/25/22 1006 03/13/22 1755 03/14/22 0303 05/30/22 1042  NA 125* 123* 125* 126*  K 3.7 3.1* 3.5 3.8  CL 91* 89* 91* 91*  CO2 _0 GLUCOSE 87 97 115* 103*  BUN _1 CALCIUM 9.0 8.6* 8.8* 8.7*  CREATININE 1.62* 1.73* 1.68* 1.43*  GFRNONAA 47* 43* 45* 54*    LIVER FUNCTION TESTS: Recent Labs    11/07/21 1415 01/25/22 1006 03/14/22 0042 05/30/22 1042  BILITOT 0.4 0.3 0.6 0.4  AST _2 ALT _3 ALKPHOS 112 117 103 116  PROT 6.8 7.2 6.1* 7.2  ALBUMIN 2.9* 3.5 2.4* 3.3*    TUMOR MARKERS: No results for input(s): "AFPTM", "CEA", "CA199",  "CHROMGRNA" in the last 8760 hours.  Assessment and Plan: 66 yo male presents to IR for right chest tube placement for right pneumothorax.   Pt resting in bed.  He is A&O, calm and pleasant.  He is in no distress.  Dr. Pascal Lux at bedside discussing procedure.   Risks and benefits of chest tube placement were discussed with the patient including bleeding, infection, damage to adjacent structures, malfunction of the tube requiring additional procedures and sepsis.  All of the patient's questions were answered, patient is agreeable to proceed. Consent signed and in chart.  Thank you for this interesting consult.  I greatly enjoyed meeting Sean Rose and look forward to participating in their care.  A copy of this report was sent to the requesting provider on this date.  Electronically Signed: Tyson Alias, NP 06/07/2022, 10:12 AM   I spent a total of 20 minutes in face to face in clinical consultation, greater than 50% of which was counseling/coordinating care for right pneumothorax.

## 2022-06-07 NOTE — Progress Notes (Signed)
      [address]       [place] [address]             [phone]       Pigtail placed by IR, on suction with minimal air leak Tolerated well.  Revonda Standard Roxan Hockey, MD [facility] [phone]

## 2022-06-07 NOTE — Sedation Documentation (Signed)
Port-a-cath dressing changed, using sterile technique. CT tech placing chest tube bandage/securing with a petroleum gauze.

## 2022-06-07 NOTE — Progress Notes (Signed)
Mobility Specialist Progress Note:   06/07/22 1031  Mobility  Activity Refused mobility   Pt in bed stating he gets PT at home and would not like to be seen by mobility while in the hospital.   [facility] Sonakshi Rolland Mobility Specialist Secure Chat only

## 2022-06-07 NOTE — Progress Notes (Signed)
Initial Nutrition Assessment  DOCUMENTATION CODES:   Underweight  INTERVENTION:  Will liberalize diet from heart healthy to regular.  Provide [employer] Breakfast Essentials in whole milk po TID with meals, each supplement provides 290 kcal and 13 grams of protein.  Provide Magic cup TID with meals, each supplement provides 290 kcal and 9 grams of protein.  NUTRITION DIAGNOSIS:   Increased nutrient needs related to catabolic illness (COPD, history of non-small cell carcinoma of the lung) as evidenced by estimated needs.  GOAL:   Patient will meet greater than or equal to 90% of their needs  MONITOR:   PO intake, Supplement acceptance, Labs, Weight trends, I & O's  REASON FOR ASSESSMENT:   Malnutrition Screening Tool    ASSESSMENT:   66 year old male with PMHx of tobacco use, severe COPD, stage IIIa non-small cell carcinoma of the lung s/p right upper lobectomy followed by adjuvant chemotherapy, CAD, GERD, HTN, HLD, heart murmur admitted with pneumothorax now s/p CT guided placement of 12 Fr drainage catheter into right pleural space.  Attempted to meet with patient and wife at bedside for nutrition assessment as he screened on Malnutrition Screening Tool. Patient declined nutrition assessment from RD. He reports he is admitted with pneumothorax, which is not a nutrition issue. Explained to patient the reason he screened for nutrition assessment (MST) and that RD able to look at whole picture and patient's past medical history for nutrition assessment. However, patient continues to decline. Patient's wife reports he does not like the food in the hospital so she will bring food from home. Patient has tried Boost and Ensure in the past and doesn't like them.   Per review of notes in chart UBW was 145 lbs. On 06/07/21 patient weighed 117 lbs. He has lost 15.37 lbs or 13.1% weight over the past year, which is not significant for time frame. However, as UBW was 145 lbs a majority of  weight was lost prior to the past year.  UOP: 700 mL UOP  I/O: -410 mL since admission  Medications reviewed and include: folic acid 1 mg daily, magnesium gluconate 500 mg BID, Remeron 15 mg QHS, pantoprazole.  Labs reviewed: Sodium 126, Chloride 91, Creatinine 1.43.  Suspect pt likely meets criteria for malnutrition, but unable to determine as pt declined nutrition assessment and nutrition-focused physical exam. RD will order [employer] and Magic Cup with meal trays for patient to try during admission. Patient would also benefit from liberalized diet.  NUTRITION - FOCUSED PHYSICAL EXAM:  Patient declined nutrition-focused physical exam  Diet Order:   Diet Order             Diet Heart Room service appropriate? Yes; Fluid consistency: Thin  Diet effective now                  EDUCATION NEEDS:   Not appropriate for education at this time (Pt declined nutrition assessment)  Skin:  Skin Assessment: Reviewed RN Assessment  Last BM:  06/01/2022 per chart (PTA)  Height:   Ht Readings from Last 1 Encounters:  06/06/22 5\' 8"  (1.727 m)   Weight:   Wt Readings from Last 1 Encounters:  06/06/22 46.1 kg   Ideal Body Weight:  70 kg  BMI:  Body mass index is 15.45 kg/m.  Estimated Nutritional Needs:   Kcal:  1800-2000  Protein:  90-100  Fluid:  1.8-2 L/day  Loanne Drilling, MS, RD, LDN, CNSC Pager number available on Amion

## 2022-06-07 NOTE — Therapy (Deleted)
OUTPATIENT PHYSICAL THERAPY TREATMENT NOTE   Patient Name: Sean Rose MRN: [id number] DOB:11-15-1955, 66 y.o., male Today's Date: 06/07/2022  PCP: [name], NP  REFERRING PROVIDER: [name], NP   END OF SESSION:       Past Medical History:  Diagnosis Date   Allergy    Anemia    Anginal pain ([place])    Blood transfusion without reported diagnosis    CAD (coronary artery disease)    CAP (community acquired pneumonia) 09/2016   COPD (chronic obstructive pulmonary disease) ([place])    Emphysema of lung ([place])    GERD (gastroesophageal reflux disease)    Heart murmur    "I was told I had one when I was a kid"   Hemorrhoids    History of anal fissures    "no surgeries" (10/30/2016)   Hyperlipidemia    Hypertension    lung ca dx'd 08/2018   with mets to bones in arms   Myocardial infarction [facility])    Peripheral arterial disease ([place])    status post right common iliac artery stenting back in 2007   Seasonal allergies    Tobacco abuse    Past Surgical History:  Procedure Laterality Date   ANKLE SURGERY Left    "rebuilt it"   ANTERIOR CRUCIATE LIGAMENT REPAIR Right    CARDIAC CATHETERIZATION  09/23/2008   Continued medical therapy - may need GI evaluation in addition.   CARDIAC CATHETERIZATION  10/28/2007   Medical therapy recommended.   CARDIAC CATHETERIZATION  11/18/2006   In-stent restenosis RCA  (50% distal edge, 80% segmental mid, and 50-60% segmental proximal). Successful cutting balloon atherectomy using a [id number] cutting balloon. 3 inflations with atherectomy performed on mid and proximal portions resulting in reduction of 80% mid in-stent restenosis to less than 20% residual and 50-60% segmental proximal to less than 20% residual without dissection.   CARDIAC CATHETERIZATION  02/26/2006   Severe stenosis in RCA. Stenting performed using IVUS. 3.5x20 Maverick balloon deployed at [employer]. Distal stent-a 4x28 Liberte stent-deployed 12atm 48sec, 12atm 31sec, 4atm  19sec. Mid stent-a 4x28 Liberte stent-deployed 14atm 45sec, 14atm 60sec, 14atm 44sec. Proximal stent-4x8 Liberte- 14atm 45sec,14atm 47sec, 16atm 43sec. Severely diseased segment then appeared TIMI-3 flow.   CARDIOVASCULAR STRESS TEST  11/17/2012   No significant ECG changes. Septal perfusion defect is new when complared to study from 2010. Abnormal myocardial perfusion imaging with a basal to mid perfusion suggestive of previous MI.   CAROTID DOPPLER  08/09/2011   Bilateral Bulb/Proximal ICA - demonstrated a mild amount of fibrous plaque without evidence of significant diameter reduction reduction or other vascular abnormality.   CHEST TUBE INSERTION Right 09/02/2018   Procedure: Chest Tube Insertion;  Surgeon: Garner Nash, DO;  Location: [facility];  Service: Thoracic;  Laterality: Right;   COLONOSCOPY     2003, 2014   CORONARY ANGIOPLASTY WITH STENT PLACEMENT     FEMORAL ARTERY STENT     INGUINAL HERNIA REPAIR Right    IR IMAGING GUIDED PORT INSERTION  11/13/2020   KNEE ARTHROSCOPY Right "multiple"   LAPAROSCOPIC APPENDECTOMY N/A 12/07/2018   Procedure: APPENDECTOMY LAPAROSCOPIC;  Surgeon: Coralie Keens, MD;  Location: [facility];  Service: General;  Laterality: N/A;   LOWER EXTREMITY ARTERIAL DOPPLER  01/31/2011   Bilateral ABIs-normal values with no suggestion of arterial insuff to the lower extremities at rest. Right CIA stent-mild amount of nonhemodynamically significant plaque is noted throughout   [place]   "bone-eating tumor"   PERCUTANEOUS  STENT INTERVENTION  04/04/2006 & 04/13/2015   a. Right common iliac artery with an 8.0x18 mm Herculink stent deployed at 12 atm. Stenosis was reduced from 80% to 0% with brisk flow. b. I-cast stenting to left common iliac artery   PERIPHERAL VASCULAR CATHETERIZATION N/A 04/13/2015   Procedure: Lower Extremity Angiography;  Surgeon: Lorretta Harp, MD; L-oCIA 75%, 40-50% L-EIA, R-CIA stent patent, s/p 8 mm x 38 mm ICast covered stent>>0%  stenosis in [place] Right    TRANSTHORACIC ECHOCARDIOGRAM  11/26/2012   EF not noted. Aortic valve-sclerosis without stenosis, no regurgiation.    UPPER GASTROINTESTINAL ENDOSCOPY     US CAROTID DOPPLER BILATERAL ([place] [facility])  08/09/2011   Bilateral Bulb/Proximal ICAa demonstrated a mild amount of fibrous plaque without evidence of significant diameter reduction or any other vascular abnormality.   VIDEO ASSISTED THORACOSCOPY (VATS)/ LOBECTOMY Right 09/14/2018   Procedure: RIGHT VIDEO ASSISTED THORACOSCOPY (VATS)/ RIGHT UPPER LOBECTOMY;  Surgeon: Melrose Nakayama, MD;  Location: [facility];  Service: Thoracic;  Laterality: Right;   VIDEO BRONCHOSCOPY WITH ENDOBRONCHIAL NAVIGATION N/A 09/02/2018   Procedure: VIDEO BRONCHOSCOPY WITH ENDOBRONCHIAL NAVIGATION;  Surgeon: Garner Nash, DO;  Location: [facility];  Service: Thoracic;  Laterality: N/A;   Patient Active Problem List   Diagnosis Date Noted   Spontaneous pneumothorax 06/06/2022   CKD (chronic kidney disease) stage 3, GFR 30-59 ml/min (HCC) 03/14/2022   Antineoplastic chemotherapy induced anemia 08/01/2021   Intractable nausea and vomiting 03/01/2021   Generalized abdominal pain 03/01/2021   Elevated lactic acid level 03/01/2021   Port-A-Cath in place 11/21/2020   Adenocarcinoma of right lung, stage 4 ([place]) 10/30/2020   Encounter for antineoplastic immunotherapy 10/30/2020   Bone metastasis 10/01/2020   Abdominal pain 12/07/2018   Appendicitis 12/07/2018   Goals of care, counseling/discussion 10/13/2018   Encounter for antineoplastic chemotherapy 10/13/2018   S/P Right VATS with Right Upper Lobectomy, Lymph Node Sampling, Intercostal Nerve Block 09/14/2018   Pneumothorax after biopsy 09/02/2018   Stage 1 mild COPD by GOLD classification ([place]) 07/30/2018   Syncope 07/23/2018   Antiplatelet or antithrombotic long-term use 07/14/2018   Nodule of upper lobe of right lung 07/14/2018   Multiple  lung nodules on CT 07/14/2018   Nausea vomiting and diarrhea 10/30/2016   Hyponatremia 10/30/2016   Hypocalcemia 10/30/2016   Hypokalemia 04/14/2015   Hypomagnesemia 04/14/2015   Claudication ([place]) 04/13/2015   Essential hypertension 01/14/2014   Hyperlipidemia 01/14/2014   Thrombosed external hemorrhoids 12/07/2013   Chronic anticoagulation 01/20/2013   PVD (peripheral vascular disease) ([place]) 01/20/2013   Coronary atherosclerosis of native coronary artery 01/20/2013   GERD (gastroesophageal reflux disease) 01/20/2013   Benign esophageal stricture 01/20/2013   DENTAL CARIES 07/20/2010    REFERRING DIAG: R26.81 (ICD-10-CM) - Gait instability (Gait instability, muscle atrophy, severe protein calorie malnutrition from adenocarcinoma)  THERAPY DIAG:  History of falling  Difficulty in walking, not elsewhere classified  Muscle weakness (generalized)  Cramp and spasm  Rationale for Evaluation and Treatment Rehabilitation  PERTINENT HISTORY: Likely due to through metastatic cancer.  Discussed having palliative care come into the home for evaluation but he refused at this time.   Cancer has been stable for 2 years.  He has stopped chemo for one year. Scans and blood work every 3 months.  Only mets currently are in left shoulder   PRECAUTIONS: fall  Has patient fallen in last 6 months? Yes. Number of falls 2  LIVING ENVIRONMENT: Lives with: lives with their spouse Lives in: House/apartment Stairs: Yes: Internal: 0 steps; none and External: 4 steps; none Has following equipment at home: Wheelchair (manual) and does not use anything when not in wheelchair   OCCUPATION: disabled   PLOF: Independent with basic ADLs and Needs assistance with transfers   PATIENT GOALS to be able to function normally without needing assistance  SUBJECTIVE:                                                                                                                                                                                       SUBJECTIVE STATEMENT:  I did not get sore at all from my last session. Fatigue is the thing I am battling the most this week.   Are you having pain? Yes: NPRS scale: 0/10 Pain location: none currently Pain description: na Aggravating factors: na Relieving factors: na   OBJECTIVE: (objective measures completed at initial evaluation unless otherwise dated)  DIAGNOSTIC FINDINGS: na     COGNITION:           Overall cognitive status: Within functional limits for tasks assessed                          SENSATION: WFL     POSTURE: rounded shoulders and forward head     LOWER EXTREMITY ROM:   WFL   LOWER EXTREMITY MMT:   Deferred due to possible bone metastises   FUNCTIONAL TESTS:  5 times sit to stand: 27.23 Timed up and go (TUG): 13.40   GAIT: Distance walked: 30 Assistive device utilized:  refuses a.d. needs cga Level of assistance: CGA Comments: slow, unsteady       TODAY'S TREATMENT:  05/31/22: Nustep (new model) L2 x 7 min no rest with PTA present to monitor. Heel & toe lifts with 2# on thighs 12x each Seated fingers clasped lifting arms 10x2 Marching x 10 with 2.5# Seated LAQ with 2.5# x 10 each LE Blue loop seated clamshells 20x Ball squeezes 20x Unassisted standing: 4 min Walking with RW 2 min 45 sec 54 feet: Pt required to sit and semi-recline after dut to feeling dizzy. Vitals were WNL. Pt required 3-5 min to semi-recline  05/24/22: NuStep x 6 min level 2 rest break every 2 min Seated toe and heel raises 10x2 Seated fingers clasped lifting arms 10x2 Ball squeezes 10x Blue loop seated clamshells 10x2 Marching x 20 with 2# Seated LAQ with 2# x 10 each LE Standing at walker 3 min Ambulation/gait training with rw,  PT walking behind patient with wheelchair   05/17/22: Seated toe raises 10x2  Seated fingers clasped lifting arms 10x Seated heel raises 10x2 Ball squeezes 10x Green loop seated clamshells  10x2 Standing at walker 2 min, light UE, SBA to montior, second round 2:30 at walker with SBA  Seated glute squeezes 10x 3 sec hold Spoke with wife and pt about doing some standing at the walker at home with his wife in SBA and beginning at 2 min if he felt ok with what we did today then progress slowly. Both agreed and understood.    Eval: Initial eval completed, educated on proper DME needed in the home for safety.  Discussed grab bars in shower, need for rollator walker.      PATIENT EDUCATION:  Education details: Animal nutritionist, DME needs Person educated: Patient and Spouse Education method: Customer service manager Education comprehension: verbalized understanding and needs further education     HOME EXERCISE PROGRAM: Access Code: [email or web address] URL: [email or web address] Date: 05/15/2022 Prepared by: Venetia Night Beuhring  Exercises - Supine Quad Set  - 3 x daily - 7 x weekly - 1 sets - 10 reps - 5 hold - Supine Gluteal Sets  - 3 x daily - 7 x weekly - 1 sets - 10 reps - 5 hold - Supine Straight Leg Raises  - 3 x daily - 7 x weekly - 1 sets - 10 reps - Hooklying Isometric Clamshell  - 3 x daily - 7 x weekly - 1 sets - 20 reps   ASSESSMENT:   CLINICAL IMPRESSION: Pt arrives with good energy and no pain. Pt was able to increase his overall overall workload.without excessive fatigue. Pt reports that in the last week/wekk & a half he is confident and safe to walk from the living room or to his bedroom independently now. He feels he is standing steadier and longer. Pt stood for 4 min unassisted and ambulated 54 feet with RW. Pt needed to lay back for a few minutes after standing and walking due to dizziness.   OBJECTIVE IMPAIRMENTS Abnormal gait, cardiopulmonary status limiting activity, decreased activity tolerance, decreased balance, decreased coordination, decreased endurance, decreased mobility, difficulty walking, decreased ROM, decreased strength, decreased safety  awareness, increased muscle spasms, impaired flexibility, and pain.    ACTIVITY LIMITATIONS carrying, lifting, bending, standing, squatting, sleeping, stairs, transfers, bed mobility, and dressing   PARTICIPATION LIMITATIONS: meal prep, cleaning, laundry, driving, shopping, and community activity   [place] and 1 comorbidity: protein deficiency  are also affecting patient's functional outcome.    REHAB POTENTIAL: Good   CLINICAL DECISION MAKING: Stable/uncomplicated   EVALUATION COMPLEXITY: Low     GOALS: Goals reviewed with patient? Yes   SHORT TERM GOALS: Target date: 06/05/2022  Patient will be independent with initial HEP  Baseline: Goal status: Goal met 05/17/22   2.  Patient to be able to stand for at least 2 min to do activity Baseline:  Goal status: Goal met 05/17/22   3.  Patient to be able to walk 50 feet with proper assistive device safely with stand by assist Baseline:  Goal status: On going, walked 54 feet but got dizzy and required to recline for 5 min.    LONG TERM GOALS: Target date: 07/03/2022    Patient to be independent with advanced HEP  Baseline:  Goal status: INITIAL   2.  Patient to be able to stand for up to 15 min to be able to take shower standing safely Baseline:  Goal status: INITIAL   3.  Patient to be able to walk 300 feet  with proper assistive device safely and independently Baseline: 10 feet with cga Goal status: INITIAL   4.  Patient scores on 5 times sit to stand and TUG to improve by 5-10 sec. Baseline:  Goal status: INITIAL   5.  Patient to be able to eat normal 3 meals per day to improve iron levels and energy Baseline:  Goal status: INITIAL   6.  Patient to obtain proper DME and assistive device and to report no falls during episode of treatment Baseline:  Goal status: INITIAL     PLAN: PT FREQUENCY: 1-2x/week   PT DURATION: 8 weeks   PLANNED INTERVENTIONS: Therapeutic exercises, Therapeutic activity,  Neuromuscular re-education, Balance training, Gait training, Patient/Family education, Self Care, Joint mobilization, Stair training, DME instructions, Aquatic Therapy, Dry Needling, Cryotherapy, Moist heat, Taping, Manual therapy, and Re-evaluation   PLAN FOR NEXT SESSION: NuStep,  LE strengthening, standing tolerance, gait training with Edmonia James, PTA 06/07/22 7:57 AM  [facility] [address] [place], [place] [address] Phone # [phone] Fax [phone]

## 2022-06-07 NOTE — Hospital Course (Signed)
History of Present Illness:  Tru Rana is a 66 year old man with a past history of tobacco abuse, severe COPD, stage IIIa non-small cell carcinoma of the lung status post right upper lobectomy followed by adjuvant chemotherapy, hypertension, hyperlipidemia, CAD, mild, PAD, reflux, heart murmur, and anemia.     He had a right upper lobectomy in 2020 for a T3, N1, stage IIIa non-small cell carcinoma.  Postoperatively he had a prolonged air leak and went home with a tube.  Took about a month for the airleak to completely resolved.  He had postoperative adjuvant chemotherapy and has been disease-free since then.   Recently he had a CT of the chest for surveillance.  Prior to the CT he developed acute onset of right-sided chest and shoulder pain and shortness of breath.  Initially this was about a 10 on 1-10 scale.  He did not seek medical attention.  He had a CT scan and it showed a loculated pneumothorax with pneumomediastinum and some slight shift of the mediastinum.   He saw Dr. [name] on 06/03/2022.  His symptoms were better but not resolved.  He was referred here for evaluation.  He says that that yesterday his symptoms had improved to the point where his discomfort and shortness of breath were about the 4.  However today he has felt a little more pressure and would describe it as a 5.  Not nearly as severe as it was initially.  It was felt he would require admission for chest tube placement.    Hospital Course:  Leon Montoya remained stable upon hospital admission.  IR consult was obtained for CT guided chest tube placement.  This was performed on 06/08/2022.  Follow up CXR showed resolution of pneumothorax. He developed and air leak.  His chest xray showed no pneumothorax.  He was transitioned to water seal.  CXR remained stable without pneumothorax.  He was placed on a Mini Express pleurovac.  He is will follow up in our office in 1 week with repeat CXR.  He is stable for discharge home today.

## 2022-06-08 ENCOUNTER — Inpatient Hospital Stay (HOSPITAL_COMMUNITY): Payer: BC Managed Care – PPO

## 2022-06-08 MED ORDER — ENOXAPARIN SODIUM 30 MG/0.3ML IJ SOSY
30.0000 mg | PREFILLED_SYRINGE | INTRAMUSCULAR | Status: DC
  Filled 2022-06-08 (×3): qty 0.3

## 2022-06-08 NOTE — Progress Notes (Addendum)
      [address]       [place] [address]             [phone]       Subjective:   Patient discouraged this morning.  He was hoping he would get to go home today.  He is upset someone moved his tray table away from him.  He has pain at chest tube site.  He states when is having pain it hurts to take deep breaths.  Objective: Vital signs in last 24 hours: Temp:  [97.8 F (36.6 C)-98.3 F (36.8 C)] 97.9 F (36.6 C) (11/11 0717) Pulse Rate:  [68-81] 77 (11/11 0717) Cardiac Rhythm: Normal sinus rhythm (11/10 1900) Resp:  [12-20] 16 (11/11 0717) BP: (100-134)/(73-87) 134/84 (11/11 0717) SpO2:  [96 %-100 %] 99 % (11/11 0717)  Intake/Output from previous day: 11/10 0701 - 11/11 0700 In: 490 [P.O.:490] Out: 250 [Urine:250] Intake/Output this shift: Total I/O In: -  Out: 200 [Urine:200]  General appearance: alert, cooperative, and no distress Heart: regular rate and rhythm Lungs: clear to auscultation bilaterally Wound: clean and dry  Lab Results: No results for input(s): "WBC", "HGB", "HCT", "PLT" in the last 72 hours. BMET: No results for input(s): "NA", "K", "CL", "CO2", "GLUCOSE", "BUN", "CREATININE", "CALCIUM" in the last 72 hours.  PT/INR: No results for input(s): "LABPROT", "INR" in the last 72 hours. ABG    Component Value Date/Time   PHART 7.474 (H) 09/15/2018 0750   HCO3 25.0 09/15/2018 0750   TCO2 20 (L) 09/14/2018 1547   ACIDBASEDEF 6.0 (H) 09/14/2018 1547   O2SAT 94.1 09/15/2018 0750   CBG (last 3)  No results for input(s): "GLUCAP" in the last 72 hours.  Assessment/Plan:  Right Sided pneumothorax with left shift- S/P Placement of IR Pigtail... CT on suction and will leave today... there is no air leak present CV-remains hemodynamically stable continue Toprol XL, Norvasc, Plavix Renal- creatinine is trending down DVT prophylaxis will start Lovenox today Dispo- patient stable, leave chest tube to suction today, repeat CXR in  AM   LOS: 2 days    Ellwood Handler, PA-C 06/08/2022   Chart reviewed, patient examined, agree with above. CXR looked ok this am. When I looked at tube this afternoon the stopcock was turned off to pt. When I opened it up there was persistent 1-2 chamber air leak. Will keep to suction and check CXR in am.

## 2022-06-09 ENCOUNTER — Inpatient Hospital Stay (HOSPITAL_COMMUNITY): Payer: BC Managed Care – PPO

## 2022-06-09 NOTE — Progress Notes (Addendum)
      [address]       [place] [address]             [phone]       Subjective:  Patient doing well.    Objective: Vital signs in last 24 hours: Temp:  [97.5 F (36.4 C)-97.9 F (36.6 C)] 97.5 F (36.4 C) (11/11 2349) Pulse Rate:  [63-74] 63 (11/12 0313) Resp:  [16-20] 16 (11/12 0313) BP: (103-130)/(77-84) 120/77 (11/12 0313) SpO2:  [98 %-100 %] 98 % (11/12 0313)  Intake/Output from previous day: 11/11 0701 - 11/12 0700 In: -  Out: 200 [Urine:200]  General appearance: alert, cooperative, and no distress Heart: regular rate and rhythm Lungs: clear to auscultation bilaterally Abdomen: soft, non-tender; bowel sounds normal; no masses,  no organomegaly Extremities: extremities normal, atraumatic, no cyanosis or edema Wound: clean and dry  Lab Results: No results for input(s): "WBC", "HGB", "HCT", "PLT" in the last 72 hours. BMET: No results for input(s): "NA", "K", "CL", "CO2", "GLUCOSE", "BUN", "CREATININE", "CALCIUM" in the last 72 hours.  PT/INR: No results for input(s): "LABPROT", "INR" in the last 72 hours. ABG    Component Value Date/Time   PHART 7.474 (H) 09/15/2018 0750   HCO3 25.0 09/15/2018 0750   TCO2 20 (L) 09/14/2018 1547   ACIDBASEDEF 6.0 (H) 09/14/2018 1547   O2SAT 94.1 09/15/2018 0750   CBG (last 3)  No results for input(s): "GLUCAP" in the last 72 hours.  Assessment/Plan:  Right sided pneumothorax, S/P Pigtail placement... stopcock got turned off to patient yesterday, patient thinks he moved it from his arm... this morning there is an air leak with trace apicolateral pneumothorax CV- hemodynamically stable on Toprol XL, Norvasc, Plavix DVT prophylaxis continue Lovenox Dispo- patient stable, + air leak on suction today, trace apico/lateral pneumothorax on CXR.Marland Kitchen will discuss with Dr. Cyndia Bent to leave tube on suction vs. Trial with water seal   LOS: 3 days    Ellwood Handler, PA-C 06/09/2022   Chart reviewed, patient  examined, agree with above. He has audible air leak with stethoscope and continuous 2 chamber leak in [place]. CXR looks ok. Will put to water seal overnight to see if lung remains expanded to decide about possibility of sending home with mini-express.

## 2022-06-10 ENCOUNTER — Inpatient Hospital Stay (HOSPITAL_COMMUNITY): Payer: BC Managed Care – PPO

## 2022-06-10 ENCOUNTER — Encounter: Payer: Self-pay | Admitting: Internal Medicine

## 2022-06-10 ENCOUNTER — Other Ambulatory Visit (HOSPITAL_COMMUNITY): Payer: Self-pay

## 2022-06-10 DIAGNOSIS — J939 Pneumothorax, unspecified: Secondary | ICD-10-CM

## 2022-06-10 MED ORDER — HEPARIN SOD (PORK) LOCK FLUSH 100 UNIT/ML IV SOLN
500.0000 [IU] | INTRAVENOUS | Status: AC | PRN
  Administered 2022-06-10: 500 [IU]

## 2022-06-10 MED ORDER — OXYCODONE HCL 5 MG PO TABS
5.0000 mg | ORAL_TABLET | ORAL | 0 refills | Status: DC | PRN
  Filled 2022-06-10: qty 30, 5d supply, fill #0

## 2022-06-10 MED ORDER — INFLUENZA VAC A&B SA ADJ QUAD 0.5 ML IM PRSY
0.5000 mL | PREFILLED_SYRINGE | INTRAMUSCULAR | Status: AC
  Administered 2022-06-10: 0.5 mL via INTRAMUSCULAR
  Filled 2022-06-10: qty 0.5

## 2022-06-10 NOTE — Progress Notes (Signed)
Nsg Discharge Note  Admit Date:  06/06/2022 Discharge date: 06/10/2022   [name] to be D/C'd Home per MD order.  AVS completed. Patient/caregiver able to verbalize understanding.  Discharge Medication: Allergies as of 06/10/2022       Reactions   Compazine [prochlorperazine] Other (See Comments)   " made him high and could not sleep". He does not want to take it again.        Medication List     TAKE these medications    acetaminophen 500 MG tablet Commonly known as: TYLENOL Take 1,000 mg by mouth every 6 (six) hours as needed for headache.   amLODipine 5 MG tablet Commonly known as: NORVASC Take 1 tablet (5 mg total) by mouth daily.   aspirin EC 81 MG tablet Take 81 mg by mouth daily.   clopidogrel 75 MG tablet Commonly known as: PLAVIX TAKE 1 TABLET BY MOUTH EVERY DAY   esomeprazole 20 MG capsule Commonly known as: NEXIUM Take 40 mg by mouth daily.   fexofenadine 60 MG tablet Commonly known as: ALLEGRA Take 60 mg by mouth every evening.   folic acid 1 MG tablet Commonly known as: FOLVITE TAKE 1 TABLET(1 MG) BY MOUTH DAILY What changed: See the new instructions.   Magnesium 500 MG Tabs Take 1 tablet (500 mg total) by mouth 3 (three) times daily. What changed: when to take this   metoprolol succinate 25 MG 24 hr tablet Commonly known as: TOPROL-XL TAKE 1 TABLET BY MOUTH DAILY   mirtazapine 15 MG tablet Commonly known as: REMERON Take 1 tablet (15 mg total) by mouth at bedtime.   multivitamin with minerals Tabs tablet Take 1 tablet by mouth daily.   oxyCODONE 5 MG immediate release tablet Commonly known as: Oxy IR/ROXICODONE Take 1 tablet (5 mg total) by mouth every 4 (four) hours as needed for moderate pain or severe pain.   rosuvastatin 5 MG tablet Commonly known as: CRESTOR TAKE 1 TABLET(5 MG) BY MOUTH EVERY OTHER DAY What changed: See the new instructions.   Trelegy Ellipta 100-62.5-25 MCG/ACT Aepb Generic drug:  Fluticasone-Umeclidin-Vilant Inhale 1 puff into the lungs daily.        Discharge Assessment: Vitals:   06/10/22 0852 06/10/22 1210  BP: 131/86 125/84  Pulse: 81 72  Resp:    Temp:  98.1 F (36.7 C)  SpO2: 100% 98%   Skin clean, dry and intact without evidence of skin break down, no evidence of skin tears noted. IV catheter discontinued intact. Site without signs and symptoms of complications - no redness or edema noted at insertion site, patient denies c/o pain - only slight tenderness at site.  Dressing with slight pressure applied.  D/c Instructions-Education: Discharge instructions given to patient/family with verbalized understanding. D/c education completed with patient/family including follow up instructions, medication list, d/c activities limitations if indicated, with other d/c instructions as indicated by MD - patient able to verbalize understanding, all questions fully answered. Patient instructed to return to ED, call 911, or call MD for any changes in condition.  Patient escorted via [place], and D/C home via private auto.  Atilano Ina, RN 06/10/2022 12:41 PM

## 2022-06-10 NOTE — Plan of Care (Signed)

## 2022-06-10 NOTE — Discharge Summary (Signed)
Physician Discharge Summary  Patient ID: Sean Rose MRN: [id number] DOB/AGE: 66-Apr-1957 66 y.o.  Admit date: 06/06/2022 Discharge date: 06/10/2022  Admission Diagnoses:  Patient Active Problem List   Diagnosis Date Noted   Spontaneous pneumothorax 06/06/2022   CKD (chronic kidney disease) stage 3, GFR 30-59 ml/min (HCC) 03/14/2022   Antineoplastic chemotherapy induced anemia 08/01/2021   Intractable nausea and vomiting 03/01/2021   Generalized abdominal pain 03/01/2021   Elevated lactic acid level 03/01/2021   Port-A-Cath in place 11/21/2020   Adenocarcinoma of right lung, stage 4 ([place]) 10/30/2020   Encounter for antineoplastic immunotherapy 10/30/2020   Bone metastasis 10/01/2020   Abdominal pain 12/07/2018   Appendicitis 12/07/2018   Goals of care, counseling/discussion 10/13/2018   Encounter for antineoplastic chemotherapy 10/13/2018   S/P Right VATS with Right Upper Lobectomy, Lymph Node Sampling, Intercostal Nerve Block 09/14/2018   Pneumothorax after biopsy 09/02/2018   Stage 1 mild COPD by GOLD classification ([place]) 07/30/2018   Syncope 07/23/2018   Antiplatelet or antithrombotic long-term use 07/14/2018   Nodule of upper lobe of right lung 07/14/2018   Multiple lung nodules on CT 07/14/2018   Nausea vomiting and diarrhea 10/30/2016   Hyponatremia 10/30/2016   Hypocalcemia 10/30/2016   Hypokalemia 04/14/2015   Hypomagnesemia 04/14/2015   Claudication ([place]) 04/13/2015   Essential hypertension 01/14/2014   Hyperlipidemia 01/14/2014   Thrombosed external hemorrhoids 12/07/2013   Chronic anticoagulation 01/20/2013   PVD (peripheral vascular disease) ([place]) 01/20/2013   Coronary atherosclerosis of native coronary artery 01/20/2013   GERD (gastroesophageal reflux disease) 01/20/2013   Benign esophageal stricture 01/20/2013   DENTAL CARIES 07/20/2010   Discharge Diagnoses:  Patient Active Problem List   Diagnosis Date Noted   Spontaneous pneumothorax 06/06/2022    CKD (chronic kidney disease) stage 3, GFR 30-59 ml/min (HCC) 03/14/2022   Antineoplastic chemotherapy induced anemia 08/01/2021   Intractable nausea and vomiting 03/01/2021   Generalized abdominal pain 03/01/2021   Elevated lactic acid level 03/01/2021   Port-A-Cath in place 11/21/2020   Adenocarcinoma of right lung, stage 4 ([place]) 10/30/2020   Encounter for antineoplastic immunotherapy 10/30/2020   Bone metastasis 10/01/2020   Abdominal pain 12/07/2018   Appendicitis 12/07/2018   Goals of care, counseling/discussion 10/13/2018   Encounter for antineoplastic chemotherapy 10/13/2018   S/P Right VATS with Right Upper Lobectomy, Lymph Node Sampling, Intercostal Nerve Block 09/14/2018   Pneumothorax after biopsy 09/02/2018   Stage 1 mild COPD by GOLD classification ([place]) 07/30/2018   Syncope 07/23/2018   Antiplatelet or antithrombotic long-term use 07/14/2018   Nodule of upper lobe of right lung 07/14/2018   Multiple lung nodules on CT 07/14/2018   Nausea vomiting and diarrhea 10/30/2016   Hyponatremia 10/30/2016   Hypocalcemia 10/30/2016   Hypokalemia 04/14/2015   Hypomagnesemia 04/14/2015   Claudication ([place]) 04/13/2015   Essential hypertension 01/14/2014   Hyperlipidemia 01/14/2014   Thrombosed external hemorrhoids 12/07/2013   Chronic anticoagulation 01/20/2013   PVD (peripheral vascular disease) ([place]) 01/20/2013   Coronary atherosclerosis of native coronary artery 01/20/2013   GERD (gastroesophageal reflux disease) 01/20/2013   Benign esophageal stricture 01/20/2013   DENTAL CARIES 07/20/2010   Discharged Condition: good  History of Present Illness:  Sean Rose is a 66 year old man with a past history of tobacco abuse, severe COPD, stage IIIa non-small cell carcinoma of the lung status post right upper lobectomy followed by adjuvant chemotherapy, hypertension, hyperlipidemia, CAD, mild, PAD, reflux, heart murmur, and anemia.     He had a right upper lobectomy in 2020 for  a  T3, N1, stage IIIa non-small cell carcinoma.  Postoperatively he had a prolonged air leak and went home with a tube.  Took about a month for the airleak to completely resolved.  He had postoperative adjuvant chemotherapy and has been disease-free since then.   Recently he had a CT of the chest for surveillance.  Prior to the CT he developed acute onset of right-sided chest and shoulder pain and shortness of breath.  Initially this was about a 10 on 1-10 scale.  He did not seek medical attention.  He had a CT scan and it showed a loculated pneumothorax with pneumomediastinum and some slight shift of the mediastinum.   He saw Dr. [name] on 06/03/2022.  His symptoms were better but not resolved.  He was referred here for evaluation.  He says that that yesterday his symptoms had improved to the point where his discomfort and shortness of breath were about the 4.  However today he has felt a little more pressure and would describe it as a 5.  Not nearly as severe as it was initially.  It was felt he would require admission for chest tube placement.    Hospital Course:  Sean Rose remained stable upon hospital admission.  IR consult was obtained for CT guided chest tube placement.  This was performed on 06/08/2022.  Follow up CXR showed resolution of pneumothorax. He developed and air leak.  His chest xray showed no pneumothorax.  He was transitioned to water seal.  CXR remained stable without pneumothorax.  He was placed on a Mini Express pleurovac.  He is will follow up in our office in 1 week with repeat CXR.  He is stable for discharge home today.  Consults:  IR  Treatments: CT placement  Discharge Exam: Blood pressure 125/84, pulse 72, temperature 98.1 F (36.7 C), temperature source Oral, resp. rate 18, height 5\' 8"  (1.727 m), weight 46.1 kg, SpO2 98 %.  General appearance: alert, cooperative, and no distress Heart: regular rate and rhythm Lungs: clear to auscultation bilaterally Wound:  clean   Discharge disposition: 01-Home or Self Care        Allergies as of 06/10/2022       Reactions   Compazine [prochlorperazine] Other (See Comments)   " made him high and could not sleep". He does not want to take it again.        Medication List     TAKE these medications    acetaminophen 500 MG tablet Commonly known as: TYLENOL Take 1,000 mg by mouth every 6 (six) hours as needed for headache.   amLODipine 5 MG tablet Commonly known as: NORVASC Take 1 tablet (5 mg total) by mouth daily.   aspirin EC 81 MG tablet Take 81 mg by mouth daily.   clopidogrel 75 MG tablet Commonly known as: PLAVIX TAKE 1 TABLET BY MOUTH EVERY DAY   esomeprazole 20 MG capsule Commonly known as: NEXIUM Take 40 mg by mouth daily.   fexofenadine 60 MG tablet Commonly known as: ALLEGRA Take 60 mg by mouth every evening.   folic acid 1 MG tablet Commonly known as: FOLVITE TAKE 1 TABLET(1 MG) BY MOUTH DAILY What changed: See the new instructions.   Magnesium 500 MG Tabs Take 1 tablet (500 mg total) by mouth 3 (three) times daily. What changed: when to take this   metoprolol succinate 25 MG 24 hr tablet Commonly known as: TOPROL-XL TAKE 1 TABLET BY MOUTH DAILY   mirtazapine 15 MG tablet  Commonly known as: REMERON Take 1 tablet (15 mg total) by mouth at bedtime.   multivitamin with minerals Tabs tablet Take 1 tablet by mouth daily.   oxyCODONE 5 MG immediate release tablet Commonly known as: Oxy IR/ROXICODONE Take 1 tablet (5 mg total) by mouth every 4 (four) hours as needed for moderate pain or severe pain.   rosuvastatin 5 MG tablet Commonly known as: CRESTOR TAKE 1 TABLET(5 MG) BY MOUTH EVERY OTHER DAY What changed: See the new instructions.   Trelegy Ellipta 100-62.5-25 MCG/ACT Aepb Generic drug: Fluticasone-Umeclidin-Vilant Inhale 1 puff into the lungs daily.        Follow-up Information     Melrose Nakayama, MD Follow up.   Specialty:  Cardiothoracic Surgery Contact information: [address] [place] [address] [phone]         [facility] Follow up.   Why: Please get CXR 2 hours prior to your appointment. Contact information: [place]                Signed: Ellwood Handler, PA-C  06/10/2022, 1:56 PM

## 2022-06-10 NOTE — Progress Notes (Addendum)
      [address]       [place] [address]             [phone]       Subjective:  Patient had a good night.  Actually was able to get some sleep last night.  Objective: Vital signs in last 24 hours: Temp:  [98 F (36.7 C)-98.7 F (37.1 C)] 98.7 F (37.1 C) (11/13 0736) Pulse Rate:  [64-77] 76 (11/13 0736) Cardiac Rhythm: Normal sinus rhythm (11/12 1940) Resp:  [16-20] 18 (11/13 0736) BP: (119-147)/(77-92) 119/87 (11/13 0736) SpO2:  [98 %-100 %] 99 % (11/13 0736)  Intake/Output from previous day: 11/12 0701 - 11/13 0700 In: 240 [P.O.:240] Out: 650 [Urine:650] Intake/Output this shift: Total I/O In: 240 [P.O.:240] Out: 350 [Urine:350]  General appearance: alert, cooperative, and no distress Heart: regular rate and rhythm Lungs: clear to auscultation bilaterally Wound: clean  Lab Results: No results for input(s): "WBC", "HGB", "HCT", "PLT" in the last 72 hours. BMET: No results for input(s): "NA", "K", "CL", "CO2", "GLUCOSE", "BUN", "CREATININE", "CALCIUM" in the last 72 hours.  PT/INR: No results for input(s): "LABPROT", "INR" in the last 72 hours. ABG    Component Value Date/Time   PHART 7.474 (H) 09/15/2018 0750   HCO3 25.0 09/15/2018 0750   TCO2 20 (L) 09/14/2018 1547   ACIDBASEDEF 6.0 (H) 09/14/2018 1547   O2SAT 94.1 09/15/2018 0750   CBG (last 3)  No results for input(s): "GLUCAP" in the last 72 hours.  Assessment/Plan:  Right sided pneumothorax-S/P Pigtail Placement- patient with air leak, CXR without pneumothorax CV- hemodynamically stable on Toprol XL, Norvasc, Plavix DVT prophylaxis continue Lovenox Dispo-patient stable, + air leak, no pneumothorax, ? Mini Express today? Plan per [name]   LOS: 4 days    Ellwood Handler, PA-C 06/10/2022 Patient seen and examined, agree with above Will place CT to miniXpress and send home with tube Will follow up in office next week  Remo Lipps C. Roxan Hockey, MD [facility] [phone]

## 2022-06-10 NOTE — Discharge Instructions (Signed)
You may sponge bath Up as tolerated You may drive as long as you are not using narcotic pain medications Contact our office at [phone] if questions/concerns arise

## 2022-06-10 NOTE — Progress Notes (Signed)
Patient refused lovenox this AM stating he has been moving legs around in the bed. This RN educated on the importance of the medication. Patient also has not had BM in over a week. Stated he doesn't want to take anything here in the hospital but will take something at home when discharged.  Sean Rose

## 2022-06-10 NOTE — Progress Notes (Signed)
Chest tube changed to miniXpress per order. Dressing changed and wife educated.  Sean Rose

## 2022-06-14 ENCOUNTER — Other Ambulatory Visit: Payer: Self-pay | Admitting: Thoracic Surgery (Cardiothoracic Vascular Surgery)

## 2022-06-14 DIAGNOSIS — J939 Pneumothorax, unspecified: Secondary | ICD-10-CM

## 2022-06-17 ENCOUNTER — Ambulatory Visit: Payer: BC Managed Care – PPO

## 2022-06-17 ENCOUNTER — Ambulatory Visit: Payer: BC Managed Care – PPO | Admitting: Thoracic Surgery (Cardiothoracic Vascular Surgery)

## 2022-06-18 ENCOUNTER — Ambulatory Visit (INDEPENDENT_AMBULATORY_CARE_PROVIDER_SITE_OTHER): Payer: BC Managed Care – PPO | Admitting: Thoracic Surgery (Cardiothoracic Vascular Surgery)

## 2022-06-18 ENCOUNTER — Ambulatory Visit
Admission: RE | Admit: 2022-06-18 | Discharge: 2022-06-18 | Disposition: A | Discharge status: Home / Self Care | Payer: BC Managed Care – PPO | Source: Ambulatory Visit | Attending: Thoracic Surgery (Cardiothoracic Vascular Surgery) | Admitting: Thoracic Surgery (Cardiothoracic Vascular Surgery)

## 2022-06-18 VITALS — BP 119/83 | HR 104 | Resp 18 | Ht 68.0 in | Wt 100.0 lb

## 2022-06-18 DIAGNOSIS — J939 Pneumothorax, unspecified: Secondary | ICD-10-CM

## 2022-06-18 DIAGNOSIS — J439 Emphysema, unspecified: Secondary | ICD-10-CM | POA: Diagnosis not present

## 2022-06-18 DIAGNOSIS — J90 Pleural effusion, not elsewhere classified: Secondary | ICD-10-CM | POA: Diagnosis not present

## 2022-06-18 DIAGNOSIS — Z4682 Encounter for fitting and adjustment of non-vascular catheter: Secondary | ICD-10-CM | POA: Diagnosis not present

## 2022-06-18 NOTE — Progress Notes (Signed)
[address]       [place] [address]             [phone]     HPI: Mr. Sean Rose returns for follow-up of his pneumothorax.  Sean Rose is a 66 year old man with a history of tobacco abuse, severe COPD, stage IIIa non-small cell lung cancer, status post right upper lobectomy, hypertension, hyperlipidemia, CAD, PAD, reflux, heart murmur, and anemia.  He had a right upper lobectomy in 2020 for a T3, N1, stage IIIa non-small cell carcinoma.  Postoperative course was complicated by an air leak which took about a month to completely resolve.  He saw Dr. [name] on 06/03/2022 with a routine follow-up scan.  It showed a loculated pneumothorax and pneumomediastinum.  He was symptomatic so we admitted him and had a pigtail catheter placed.  He went home with a tube in place with a mini express.  He has been feeling reasonably well.  His respiratory status is about at his baseline.  It has not worsened since discharge.  Minimal discomfort from the tube.  He has only taking oxycodone once since he left the hospital.   Past Medical History:  Diagnosis Date   Allergy    Anemia    Anginal pain ([place])    Blood transfusion without reported diagnosis    CAD (coronary artery disease)    CAP (community acquired pneumonia) 09/2016   COPD (chronic obstructive pulmonary disease) (HCC)    Emphysema of lung (HCC)    GERD (gastroesophageal reflux disease)    Heart murmur    "I was told I had one when I was a kid"   Hemorrhoids    History of anal fissures    "no surgeries" (10/30/2016)   Hyperlipidemia    Hypertension    lung ca dx'd 08/2018   with mets to bones in arms   Myocardial infarction [facility])    Peripheral arterial disease ([place])    status post right common iliac artery stenting back in 2007   Seasonal allergies    Tobacco abuse     Current Outpatient Medications  Medication Sig Dispense Refill   acetaminophen (TYLENOL) 500 MG tablet Take 1,000 mg by mouth every 6 (six)  hours as needed for headache.     aspirin EC 81 MG tablet Take 81 mg by mouth daily.     clopidogrel (PLAVIX) 75 MG tablet TAKE 1 TABLET BY MOUTH EVERY DAY (Patient taking differently: Take 75 mg by mouth daily.) 90 tablet 3   esomeprazole (NEXIUM) 20 MG capsule Take 40 mg by mouth daily.     fexofenadine (ALLEGRA) 60 MG tablet Take 60 mg by mouth every evening.     Fluticasone-Umeclidin-Vilant (TRELEGY ELLIPTA) 100-62.5-25 MCG/ACT AEPB Inhale 1 puff into the lungs daily. 60 each 3   folic acid (FOLVITE) 1 MG tablet TAKE 1 TABLET(1 MG) BY MOUTH DAILY (Patient taking differently: Take 1 mg by mouth daily.) 30 tablet 4   Magnesium 500 MG TABS Take 1 tablet (500 mg total) by mouth 3 (three) times daily. (Patient taking differently: Take 500 mg by mouth in the morning and at bedtime.) 8 tablet 0   metoprolol succinate (TOPROL-XL) 25 MG 24 hr tablet TAKE 1 TABLET BY MOUTH DAILY 90 tablet 0   mirtazapine (REMERON) 15 MG tablet Take 1 tablet (15 mg total) by mouth at bedtime. 90 tablet 0   Multiple Vitamin (MULTIVITAMIN WITH MINERALS) TABS tablet Take 1 tablet by mouth daily.  oxyCODONE (OXY IR/ROXICODONE) 5 MG immediate release tablet Take 1 tablet (5 mg total) by mouth every 4 (four) hours as needed for moderate pain or severe pain. 30 tablet 0   rosuvastatin (CRESTOR) 5 MG tablet TAKE 1 TABLET(5 MG) BY MOUTH EVERY OTHER DAY (Patient taking differently: Take 5 mg by mouth every other day.) 45 tablet 3   amLODipine (NORVASC) 5 MG tablet Take 1 tablet (5 mg total) by mouth daily. 180 tablet 3   No current facility-administered medications for this visit.   Facility-Administered Medications Ordered in Other Visits  Medication Dose Route Frequency Provider Last Rate Last Admin   diphenhydrAMINE (BENADRYL) 25 mg capsule             Physical Exam BP 119/83 (BP Location: Right Arm, Patient Position: Sitting)   Pulse (!) 104   Resp 18   Ht 5\' 8"  (1.727 m)   Wt 100 lb (45.4 kg)   SpO2 93% Comment:  RA  BMI 15.20 kg/m  Thin 66 year old man in no acute distress Alert and oriented Lungs with diminished breath sounds bilaterally, no wheezing Positive air leak  Diagnostic Tests: CHEST - 2 VIEW   COMPARISON:  AP chest 06/10/2022, 06/09/2022, 06/08/2022, 06/07/2022, chest two views 06/06/2022; CT abdomen and pelvis 05/30/2022   FINDINGS: A pigtail chest tube again terminates within the anteromedial lung at mid height in the region of the anterior pneumothorax seen on prior 05/30/2022 CT. Again no pneumothorax is identified on radiographs. Right chest wall porta catheter tip overlies the superior vena cava/atrial junction.   Cardiac silhouette and mediastinal contours are within normal limits. Moderate calcification within the aortic arch.   Hyperinflation with viral cystic lucencies as seen on prior CT, emphysematous changes. Mild to moderate bilateral pleural effusions, appearing small on frontal view but moderately tracking up the posterior pleura on lateral view. Mild associated bibasilar airspace opacification, unchanged.   Moderate multilevel degenerative disc changes of the thoracic spine.   IMPRESSION: 1. A pigtail chest tube again terminates within the anteromedial lung at mid height in the region of the anterior pneumothorax seen on prior 05/30/2022 CT. Again no pneumothorax is identified on radiographs. 2. Mild to moderate bilateral pleural effusions.     Electronically Signed   By: Yvonne Kendall M.D.   On: 06/18/2022 12:20  Impression: Sean Rose is a 66 year old man with a history of tobacco abuse, severe COPD, stage IIIa non-small cell lung cancer, status post right upper lobectomy, hypertension, hyperlipidemia, CAD, PAD, reflux, heart murmur, and anemia.  He recently had an episode of chest pain.  Shortly after that he had a follow-up CT which showed a loculated pneumothorax and pneumomediastinum.  He had a pigtail catheter placed by IR.  Then went home  with a tube in place on the mini express.  He is tolerating this well from a respiratory standpoint.  He does still have a air leak.  We need to leave the tube in at this point.  We did discuss potential options for the future should nearly nonstop.  One option would be IBV placement.  Another would be surgery for bleb resection. Surgery would be extremely high risk and should be avoided unless there is no other option.  I think for now we just give it more time to see if it will stop on its own.  If it does not then we can consider IBD placement in the future.  Plan: Return in 1 week with PA lateral chest x-ray  Revonda Standard  Roxan Hockey, MD [facility] [phone]

## 2022-06-20 ENCOUNTER — Other Ambulatory Visit: Payer: Self-pay | Admitting: Physician Assistant

## 2022-06-20 ENCOUNTER — Other Ambulatory Visit: Payer: Self-pay | Admitting: Cardiovascular Disease

## 2022-06-24 ENCOUNTER — Other Ambulatory Visit: Payer: Self-pay | Admitting: Adult Health

## 2022-06-24 ENCOUNTER — Other Ambulatory Visit: Payer: Self-pay | Admitting: Thoracic Surgery (Cardiothoracic Vascular Surgery)

## 2022-06-24 DIAGNOSIS — J939 Pneumothorax, unspecified: Secondary | ICD-10-CM

## 2022-06-24 DIAGNOSIS — E43 Unspecified severe protein-calorie malnutrition: Secondary | ICD-10-CM

## 2022-06-25 ENCOUNTER — Ambulatory Visit
Admission: RE | Admit: 2022-06-25 | Discharge: 2022-06-25 | Disposition: A | Discharge status: Home / Self Care | Payer: BC Managed Care – PPO | Source: Ambulatory Visit | Attending: Thoracic Surgery (Cardiothoracic Vascular Surgery) | Admitting: Thoracic Surgery (Cardiothoracic Vascular Surgery)

## 2022-06-25 ENCOUNTER — Ambulatory Visit (INDEPENDENT_AMBULATORY_CARE_PROVIDER_SITE_OTHER): Payer: BC Managed Care – PPO | Admitting: Thoracic Surgery (Cardiothoracic Vascular Surgery)

## 2022-06-25 ENCOUNTER — Inpatient Hospital Stay (HOSPITAL_COMMUNITY)
Admission: AD | Admit: 2022-06-25 | Discharge: 2022-06-26 | DRG: 201 | Disposition: A | Discharge status: Home / Self Care | Payer: BC Managed Care – PPO | Source: Ambulatory Visit | Attending: Thoracic Surgery (Cardiothoracic Vascular Surgery) | Admitting: Thoracic Surgery (Cardiothoracic Vascular Surgery)

## 2022-06-25 ENCOUNTER — Encounter (HOSPITAL_COMMUNITY): Payer: Self-pay

## 2022-06-25 ENCOUNTER — Inpatient Hospital Stay (HOSPITAL_COMMUNITY): Payer: BC Managed Care – PPO

## 2022-06-25 VITALS — BP 114/79 | HR 82 | Resp 18 | Ht 68.0 in

## 2022-06-25 DIAGNOSIS — I493 Ventricular premature depolarization: Secondary | ICD-10-CM | POA: Diagnosis not present

## 2022-06-25 DIAGNOSIS — I252 Old myocardial infarction: Secondary | ICD-10-CM | POA: Diagnosis not present

## 2022-06-25 DIAGNOSIS — E785 Hyperlipidemia, unspecified: Secondary | ICD-10-CM | POA: Diagnosis present

## 2022-06-25 DIAGNOSIS — Z7951 Long term (current) use of inhaled steroids: Secondary | ICD-10-CM

## 2022-06-25 DIAGNOSIS — J930 Spontaneous tension pneumothorax: Principal | ICD-10-CM

## 2022-06-25 DIAGNOSIS — J9382 Other air leak: Secondary | ICD-10-CM | POA: Diagnosis not present

## 2022-06-25 DIAGNOSIS — Z87891 Personal history of nicotine dependence: Secondary | ICD-10-CM | POA: Diagnosis not present

## 2022-06-25 DIAGNOSIS — Z79899 Other long term (current) drug therapy: Secondary | ICD-10-CM | POA: Diagnosis not present

## 2022-06-25 DIAGNOSIS — J9811 Atelectasis: Secondary | ICD-10-CM | POA: Diagnosis not present

## 2022-06-25 DIAGNOSIS — I10 Essential (primary) hypertension: Secondary | ICD-10-CM | POA: Diagnosis not present

## 2022-06-25 DIAGNOSIS — K219 Gastro-esophageal reflux disease without esophagitis: Secondary | ICD-10-CM | POA: Diagnosis present

## 2022-06-25 DIAGNOSIS — Z7902 Long term (current) use of antithrombotics/antiplatelets: Secondary | ICD-10-CM

## 2022-06-25 DIAGNOSIS — D649 Anemia, unspecified: Secondary | ICD-10-CM | POA: Diagnosis not present

## 2022-06-25 DIAGNOSIS — I708 Atherosclerosis of other arteries: Secondary | ICD-10-CM | POA: Diagnosis not present

## 2022-06-25 DIAGNOSIS — Z85118 Personal history of other malignant neoplasm of bronchus and lung: Secondary | ICD-10-CM

## 2022-06-25 DIAGNOSIS — Z800 Family history of malignant neoplasm of digestive organs: Secondary | ICD-10-CM

## 2022-06-25 DIAGNOSIS — Z955 Presence of coronary angioplasty implant and graft: Secondary | ICD-10-CM | POA: Diagnosis not present

## 2022-06-25 DIAGNOSIS — J9383 Other pneumothorax: Principal | ICD-10-CM | POA: Diagnosis present

## 2022-06-25 DIAGNOSIS — Z8249 Family history of ischemic heart disease and other diseases of the circulatory system: Secondary | ICD-10-CM

## 2022-06-25 DIAGNOSIS — J449 Chronic obstructive pulmonary disease, unspecified: Secondary | ICD-10-CM | POA: Diagnosis not present

## 2022-06-25 DIAGNOSIS — Z7982 Long term (current) use of aspirin: Secondary | ICD-10-CM | POA: Diagnosis not present

## 2022-06-25 DIAGNOSIS — J939 Pneumothorax, unspecified: Secondary | ICD-10-CM

## 2022-06-25 DIAGNOSIS — Z95820 Peripheral vascular angioplasty status with implants and grafts: Secondary | ICD-10-CM

## 2022-06-25 DIAGNOSIS — R110 Nausea: Secondary | ICD-10-CM | POA: Diagnosis not present

## 2022-06-25 DIAGNOSIS — Z902 Acquired absence of lung [part of]: Secondary | ICD-10-CM | POA: Diagnosis not present

## 2022-06-25 DIAGNOSIS — I2510 Atherosclerotic heart disease of native coronary artery without angina pectoris: Secondary | ICD-10-CM | POA: Diagnosis not present

## 2022-06-25 DIAGNOSIS — J90 Pleural effusion, not elsewhere classified: Secondary | ICD-10-CM | POA: Diagnosis not present

## 2022-06-25 MED ORDER — ASPIRIN 81 MG PO TBEC
81.0000 mg | DELAYED_RELEASE_TABLET | Freq: Every day | ORAL | Status: DC
  Filled 2022-06-25: qty 1

## 2022-06-25 MED ORDER — ROSUVASTATIN CALCIUM 5 MG PO TABS: 5.0000 mg | ORAL_TABLET | ORAL | Status: DC

## 2022-06-25 MED ORDER — PANTOPRAZOLE SODIUM 40 MG PO TBEC: 80.0000 mg | DELAYED_RELEASE_TABLET | Freq: Every day | ORAL | Status: DC

## 2022-06-25 MED ORDER — FENTANYL CITRATE (PF) 100 MCG/2ML IJ SOLN
INTRAMUSCULAR | Status: AC | PRN
  Administered 2022-06-25 (×2): 100 ug via INTRAVENOUS

## 2022-06-25 MED ORDER — ONDANSETRON HCL 4 MG/2ML IJ SOLN
4.0000 mg | Freq: Four times a day (QID) | INTRAMUSCULAR | Status: DC | PRN
  Administered 2022-06-25: 4 mg via INTRAVENOUS
  Filled 2022-06-25: qty 2

## 2022-06-25 MED ORDER — FENTANYL CITRATE (PF) 100 MCG/2ML IJ SOLN
INTRAMUSCULAR | Status: AC
  Filled 2022-06-25: qty 4

## 2022-06-25 MED ORDER — ACETAMINOPHEN 500 MG PO TABS
1000.0000 mg | ORAL_TABLET | Freq: Four times a day (QID) | ORAL | Status: DC | PRN
  Administered 2022-06-26: 1000 mg via ORAL
  Filled 2022-06-25 (×2): qty 2

## 2022-06-25 MED ORDER — FLUTICASONE FUROATE-VILANTEROL 100-25 MCG/ACT IN AEPB
1.0000 | INHALATION_SPRAY | Freq: Every day | RESPIRATORY_TRACT | Status: DC
  Filled 2022-06-25: qty 28

## 2022-06-25 MED ORDER — UMECLIDINIUM BROMIDE 62.5 MCG/ACT IN AEPB
1.0000 | INHALATION_SPRAY | Freq: Every day | RESPIRATORY_TRACT | Status: DC
  Filled 2022-06-25: qty 7

## 2022-06-25 MED ORDER — OXYCODONE HCL 5 MG PO TABS
5.0000 mg | ORAL_TABLET | Freq: Four times a day (QID) | ORAL | Status: DC | PRN
  Administered 2022-06-25 – 2022-06-26 (×2): 5 mg via ORAL
  Filled 2022-06-25 (×3): qty 1

## 2022-06-25 MED ORDER — METOPROLOL SUCCINATE ER 25 MG PO TB24
25.0000 mg | ORAL_TABLET | Freq: Every day | ORAL | Status: DC
  Filled 2022-06-25: qty 1

## 2022-06-25 MED ORDER — MIRTAZAPINE 15 MG PO TABS: 15.0000 mg | ORAL_TABLET | Freq: Every day | ORAL | Status: DC

## 2022-06-25 MED ORDER — FOLIC ACID 1 MG PO TABS
1.0000 mg | ORAL_TABLET | Freq: Every day | ORAL | Status: DC
  Filled 2022-06-25: qty 1

## 2022-06-25 MED ORDER — LORATADINE 10 MG PO TABS
10.0000 mg | ORAL_TABLET | Freq: Every day | ORAL | Status: DC
  Filled 2022-06-25: qty 1

## 2022-06-25 MED ORDER — CLOPIDOGREL BISULFATE 75 MG PO TABS
75.0000 mg | ORAL_TABLET | Freq: Every day | ORAL | Status: DC
  Filled 2022-06-25: qty 1

## 2022-06-25 MED ORDER — AMLODIPINE BESYLATE 5 MG PO TABS
5.0000 mg | ORAL_TABLET | Freq: Every day | ORAL | Status: DC
  Filled 2022-06-25: qty 1

## 2022-06-25 NOTE — Consult Note (Signed)
Chief Complaint: Patient was seen in consultation today for left chest tube placement.  Referring Physician(s): Oracle C  Supervising Physician: Mir, Biochemist, clinical  Patient Status: [facility] - In-pt  History of Present Illness: Sean Rose is a 66 y.o. male with a past medical history significant for GERD, anemia, CAD, HTN, HLD, MI, PAD s/p stenting, tobacco use, severe COPD, stage IIIa non-small cell lung cancer s/p right upper lobectomy seen today for left chest tube placement. Sean Rose underwent a follow up CT chest on 06/03/22 which showed a loculated pneumothorax and pneumomediastinum, he underwent right chest tube placement in IR on 06/07/22 and was discharged by CT surgery with a mini express. He has been followed by CT surgery regularly and was noted to have an air leak but was doing well last week. He was seen by CT surgery today and complained of left clavicle/shoulder pain which was similar to the pain he experienced when he had a right pneumothorax. He underwent CXR which noted a new small to moderate left pneumothorax. He has been directly admitted by CT surgery and IR has been asked to place a left sided chest tube.  Patient seen in his room, wife at bedside. He complains of left shoulder/clavicle pain which is unchanged from earlier today, he does not have chest pain or feel more short of breath than usual. He last ate a "light omelette" around noon today. He is agreeable to proceed with chest tube placement with lidocaine only.   Past Medical History:  Diagnosis Date   Allergy    Anemia    Anginal pain ([place])    Blood transfusion without reported diagnosis    CAD (coronary artery disease)    CAP (community acquired pneumonia) 09/2016   COPD (chronic obstructive pulmonary disease) (HCC)    Emphysema of lung (HCC)    GERD (gastroesophageal reflux disease)    Heart murmur    "I was told I had one when I was a kid"   Hemorrhoids    History of anal fissures    "no  surgeries" (10/30/2016)   Hyperlipidemia    Hypertension    lung ca dx'd 08/2018   with mets to bones in arms   Myocardial infarction [facility])    Peripheral arterial disease ([place])    status post right common iliac artery stenting back in 2007   Seasonal allergies    Tobacco abuse     Past Surgical History:  Procedure Laterality Date   ANKLE SURGERY Left    "rebuilt it"   ANTERIOR CRUCIATE LIGAMENT REPAIR Right    CARDIAC CATHETERIZATION  09/23/2008   Continued medical therapy - may need GI evaluation in addition.   CARDIAC CATHETERIZATION  10/28/2007   Medical therapy recommended.   CARDIAC CATHETERIZATION  11/18/2006   In-stent restenosis RCA  (50% distal edge, 80% segmental mid, and 50-60% segmental proximal). Successful cutting balloon atherectomy using a [id number] cutting balloon. 3 inflations with atherectomy performed on mid and proximal portions resulting in reduction of 80% mid in-stent restenosis to less than 20% residual and 50-60% segmental proximal to less than 20% residual without dissection.   CARDIAC CATHETERIZATION  02/26/2006   Severe stenosis in RCA. Stenting performed using IVUS. 3.5x20 Maverick balloon deployed at [employer]. Distal stent-a 4x28 Liberte stent-deployed 12atm 48sec, 12atm 31sec, 4atm 19sec. Mid stent-a 4x28 Liberte stent-deployed 14atm 45sec, 14atm 60sec, 14atm 44sec. Proximal stent-4x8 Liberte- 14atm 45sec,14atm 47sec, 16atm 43sec. Severely diseased segment then appeared TIMI-3 flow.   CARDIOVASCULAR STRESS TEST  11/17/2012   No significant ECG changes. Septal perfusion defect is new when complared to study from 2010. Abnormal myocardial perfusion imaging with a basal to mid perfusion suggestive of previous MI.   CAROTID DOPPLER  08/09/2011   Bilateral Bulb/Proximal ICA - demonstrated a mild amount of fibrous plaque without evidence of significant diameter reduction reduction or other vascular abnormality.   CHEST TUBE INSERTION Right 09/02/2018   Procedure: Chest Tube  Insertion;  Surgeon: Garner Nash, DO;  Location: [place];  Service: Thoracic;  Laterality: Right;   COLONOSCOPY     2003, 2014   CORONARY ANGIOPLASTY WITH STENT PLACEMENT     FEMORAL ARTERY STENT     INGUINAL HERNIA REPAIR Right    IR IMAGING GUIDED PORT INSERTION  11/13/2020   KNEE ARTHROSCOPY Right "multiple"   LAPAROSCOPIC APPENDECTOMY N/A 12/07/2018   Procedure: APPENDECTOMY LAPAROSCOPIC;  Surgeon: Coralie Keens, MD;  Location: [facility];  Service: General;  Laterality: N/A;   LOWER EXTREMITY ARTERIAL DOPPLER  01/31/2011   Bilateral ABIs-normal values with no suggestion of arterial insuff to the lower extremities at rest. Right CIA stent-mild amount of nonhemodynamically significant plaque is noted throughout   [place]   "bone-eating tumor"   PERCUTANEOUS STENT INTERVENTION  04/04/2006 & 04/13/2015   a. Right common iliac artery with an 8.0x18 mm Herculink stent deployed at 12 atm. Stenosis was reduced from 80% to 0% with brisk flow. b. I-cast stenting to left common iliac artery   PERIPHERAL VASCULAR CATHETERIZATION N/A 04/13/2015   Procedure: Lower Extremity Angiography;  Surgeon: Lorretta Harp, MD; L-oCIA 75%, 40-50% L-EIA, R-CIA stent patent, s/p 8 mm x 38 mm ICast covered stent>>0% stenosis in [place] Right    TRANSTHORACIC ECHOCARDIOGRAM  11/26/2012   EF not noted. Aortic valve-sclerosis without stenosis, no regurgiation.    UPPER GASTROINTESTINAL ENDOSCOPY     US CAROTID DOPPLER BILATERAL ([place] [facility]X)  08/09/2011   Bilateral Bulb/Proximal ICAa demonstrated a mild amount of fibrous plaque without evidence of significant diameter reduction or any other vascular abnormality.   VIDEO ASSISTED THORACOSCOPY (VATS)/ LOBECTOMY Right 09/14/2018   Procedure: RIGHT VIDEO ASSISTED THORACOSCOPY (VATS)/ RIGHT UPPER LOBECTOMY;  Surgeon: Melrose Nakayama, MD;  Location: [place];  Service: Thoracic;  Laterality: Right;   VIDEO  BRONCHOSCOPY WITH ENDOBRONCHIAL NAVIGATION N/A 09/02/2018   Procedure: VIDEO BRONCHOSCOPY WITH ENDOBRONCHIAL NAVIGATION;  Surgeon: Garner Nash, DO;  Location: [facility];  Service: Thoracic;  Laterality: N/A;    Allergies: Compazine [prochlorperazine]  Medications: Prior to Admission medications   Medication Sig Start Date End Date Taking? Authorizing Provider  acetaminophen (TYLENOL) 500 MG tablet Take 1,000 mg by mouth every 6 (six) hours as needed for headache.    [provider]  amLODipine (NORVASC) 5 MG tablet TAKE 1 TABLET BY MOUTH DAILY 06/24/22   Lorretta Harp, MD  aspirin EC 81 MG tablet Take 81 mg by mouth daily.    [provider]  clopidogrel (PLAVIX) 75 MG tablet TAKE 1 TABLET BY MOUTH EVERY DAY Patient taking differently: Take 75 mg by mouth daily. 09/24/21   Lorretta Harp, MD  esomeprazole (NEXIUM) 20 MG capsule Take 40 mg by mouth daily.    [provider]  fexofenadine (ALLEGRA) 60 MG tablet Take 60 mg by mouth every evening.    [provider]  Fluticasone-Umeclidin-Vilant (TRELEGY ELLIPTA) 100-62.5-25 MCG/ACT AEPB Inhale 1 puff into the lungs daily. 04/30/22  Nafziger, Tommi Rumps, NP  folic acid (FOLVITE) 1 MG tablet TAKE 1 TABLET(1 MG) BY MOUTH DAILY Patient taking differently: Take 1 mg by mouth daily. 01/21/22   Heilingoetter, Cassandra L, PA-C  Magnesium 500 MG TABS Take 1 tablet (500 mg total) by mouth 3 (three) times daily. Patient taking differently: Take 500 mg by mouth in the morning and at bedtime. 03/02/21   Nita Sells, MD  metoprolol succinate (TOPROL-XL) 25 MG 24 hr tablet TAKE 1 TABLET BY MOUTH DAILY 05/22/22   Lorretta Harp, MD  mirtazapine (REMERON) 15 MG tablet Take 1 tablet (15 mg total) by mouth at bedtime. 03/27/22   Nafziger, Tommi Rumps, NP  Multiple Vitamin (MULTIVITAMIN WITH MINERALS) TABS tablet Take 1 tablet by mouth daily.    [provider]  oxyCODONE (OXY IR/ROXICODONE) 5 MG immediate release  tablet Take 1 tablet (5 mg total) by mouth every 4 (four) hours as needed for moderate pain or severe pain. 06/10/22   Barrett, Erin R, PA-C  rosuvastatin (CRESTOR) 5 MG tablet TAKE 1 TABLET(5 MG) BY MOUTH EVERY OTHER DAY 06/24/22   Lorretta Harp, MD     Family History  Problem Relation Age of Onset   Colon cancer Mother    Heart disease Father    Heart disease Paternal Grandfather    Rectal cancer Other    Esophageal cancer Neg Hx    Stomach cancer Neg Hx     Social History   Socioeconomic History   Marital status: Married    Spouse name: Not on file   Number of children: 2   Years of education: Not on file   Highest education level: Not on file  Occupational History   Occupation: retired  Tobacco Use   Smoking status: Former    Packs/day: 0.25    Years: 38.00    Total pack years: 9.50    Types: Cigarettes    Quit date: 10/19/2018    Years since quitting: 3.6   Smokeless tobacco: Never   Tobacco comments:    pack per day 12.17.19  Vaping Use   Vaping Use: Former  Substance and Sexual Activity   Alcohol use: Not Currently    Comment: socially   Drug use: No   Sexual activity: Yes  Other Topics Concern   Not on file  Social History Narrative   Patient is married with 2 children   Former smoker no tobacco now no drug use occasional/social alcohol   Social Determinants of Radio broadcast assistant Strain: Not on file  Food Insecurity: No Food Insecurity (06/06/2022)   Hunger Vital Sign    Worried About Running Out of Food in the Last Year: Never true    [place] in the Last Year: Never true  Transportation Needs: No Transportation Needs (06/06/2022)   PRAPARE - Hydrologist (Medical): No    Lack of Transportation (Non-Medical): No  Physical Activity: Not on file  Stress: Not on file  Social Connections: Not on file     Review of Systems: A 12 point ROS discussed and pertinent positives are indicated in the HPI above.   All other systems are negative.  Review of Systems  Constitutional:  Negative for chills and fever.  Respiratory:  Negative for cough and shortness of breath.   Cardiovascular:  Negative for chest pain.  Gastrointestinal:  Negative for abdominal pain.  Musculoskeletal:  Negative for back pain.       (+) left  shoulder pain  Neurological:  Negative for dizziness and headaches.    Vital Signs: BP 133/86 (BP Location: Right Arm)   Pulse 78   Temp 98.1 F (36.7 C) (Oral)   Resp 20   SpO2 98%   Physical Exam Vitals and nursing note reviewed.  Constitutional:      General: He is not in acute distress.    Appearance: He is ill-appearing.     Comments: Cachectic appearing  HENT:     Head: Normocephalic.     Mouth/Throat:     Mouth: Mucous membranes are moist.     Pharynx: Oropharynx is clear. No oropharyngeal exudate or posterior oropharyngeal erythema.  Cardiovascular:     Rate and Rhythm: Normal rate and regular rhythm.     Comments: (+) right chest port - not currently accessed on my exam Pulmonary:     Effort: Pulmonary effort is normal.     Comments: Decreased breath sounds bilaterally L>R Abdominal:     General: There is no distension.     Palpations: Abdomen is soft.     Tenderness: There is no abdominal tenderness.  Skin:    General: Skin is warm and dry.  Neurological:     Mental Status: He is alert and oriented to person, place, and time.  Psychiatric:        Mood and Affect: Mood normal.        Behavior: Behavior normal.        Thought Content: Thought content normal.        Judgment: Judgment normal.      MD Evaluation Airway: WNL Heart: WNL Abdomen: WNL Chest/ Lungs: WNL ASA  Classification: 3 Mallampati/Airway Score: Two   Imaging: DG Chest 2 View  Result Date: 06/25/2022 CLINICAL DATA:  Pneumothorax follow-up. EXAM: CHEST - 2 VIEW COMPARISON:  Chest x-ray dated June 18, 2022. FINDINGS: Unchanged right chest wall port catheter and  right-sided chest tube. Similar postsurgical changes from prior right upper lobectomy with hyperinflation of the remaining right lung. New small to moderate left pneumothorax estimated at 25%. Partial collapse of the left lung. Unchanged small bilateral pleural effusions. Similar emphysematous changes and chronically coarsened interstitial markings. The heart size and mediastinal contours are within normal limits. No acute osseous abnormality. IMPRESSION: 1. New small to moderate left pneumothorax. 2. Right-sided chest tube remains in place with no residual visible pneumothorax. 3. Unchanged small bilateral pleural effusions. 4. COPD. Critical Value/emergent results were called by telephone at the time of interpretation on 06/25/2022 at 9:05 am to provider [facility] , who verbally acknowledged these results. Electronically Signed   By: Titus Dubin M.D.   On: 06/25/2022 09:07   DG Chest 2 View  Result Date: 06/18/2022 CLINICAL DATA:  Chest tube placement for pneumothorax. EXAM: CHEST - 2 VIEW COMPARISON:  AP chest 06/10/2022, 06/09/2022, 06/08/2022, 06/07/2022, chest two views 06/06/2022; CT abdomen and pelvis 05/30/2022 FINDINGS: A pigtail chest tube again terminates within the anteromedial lung at mid height in the region of the anterior pneumothorax seen on prior 05/30/2022 CT. Again no pneumothorax is identified on radiographs. Right chest wall porta catheter tip overlies the superior vena cava/atrial junction. Cardiac silhouette and mediastinal contours are within normal limits. Moderate calcification within the aortic arch. Hyperinflation with viral cystic lucencies as seen on prior CT, emphysematous changes. Mild to moderate bilateral pleural effusions, appearing small on frontal view but moderately tracking up the posterior pleura on lateral view. Mild associated bibasilar airspace opacification, unchanged. Moderate multilevel degenerative  disc changes of the thoracic spine. IMPRESSION: 1. A  pigtail chest tube again terminates within the anteromedial lung at mid height in the region of the anterior pneumothorax seen on prior 05/30/2022 CT. Again no pneumothorax is identified on radiographs. 2. Mild to moderate bilateral pleural effusions. Electronically Signed   By: Yvonne Kendall M.D.   On: 06/18/2022 12:20   DG CHEST PORT 1 VIEW  Result Date: 06/10/2022 CLINICAL DATA:  Pneumothorax right chest tube EXAM: PORTABLE CHEST 1 VIEW COMPARISON:  Previous studies including the examination of 06/09/2022 FINDINGS: Cardiac size is within normal limits. Low position of diaphragms suggests COPD. Bilateral pleural effusions are seen, more so on the right side. Surgical staples are seen in the medial right lung. Right chest tube is noted. There is no pneumothorax. There are no new infiltrates or signs of pulmonary edema. Tip of right IJ chest port is seen in superior vena cava. Degenerative changes are noted in left shoulder. IMPRESSION: No significant changes are noted in small bilateral pleural effusions. COPD. There are no new infiltrates or signs of alveolar pulmonary edema. Electronically Signed   By: Elmer Picker M.D.   On: 06/10/2022 08:22   DG CHEST PORT 1 VIEW  Result Date: 06/09/2022 CLINICAL DATA:  Right-sided pneumothorax. EXAM: PORTABLE CHEST 1 VIEW COMPARISON:  Yesterday FINDINGS: Right Port-A-Cath tip superior caval/atrial junction. The right-sided pigtail chest tube is similar in position. Midline trachea. Normal heart size. Atherosclerosis in the transverse aorta. Similar small bilateral pleural effusions. No pneumothorax. Bibasilar airspace disease is not significantly changed. IMPRESSION: 1. Similar small bilateral pleural effusions with bibasilar airspace disease, likely atelectasis. 2. Right chest tube in place, without pneumothorax. Electronically Signed   By: Abigail Miyamoto M.D.   On: 06/09/2022 09:14   DG Chest Port 1 View  Result Date: 06/08/2022 CLINICAL DATA:   Pneumothorax on right EXAM: PORTABLE CHEST 1 VIEW COMPARISON:  06/07/2022 FINDINGS: Interval placement of right chest tube. No visible pneumothorax. Right Port-A-Cath is unchanged. Heart is normal size. Small bilateral pleural effusions, bibasilar atelectasis. IMPRESSION: Interval placement of right chest tube.  No visible pneumothorax. Small bilateral effusions with bibasilar atelectasis. Electronically Signed   By: Rolm Baptise M.D.   On: 06/08/2022 08:34   CT GUIDED VISCERAL FLUID DRAIN BY PERC CATH  Result Date: 06/07/2022 INDICATION: History of lung cancer and advanced COPD, now with enlarging symptomatic right-sided pneumothorax. Please perform image guided chest tube placement. EXAM: CT IMAGE GUIDED DRAINAGE BY PERCUTANEOUS CATHETER COMPARISON:  Chest radiograph-06/07/2022; chest CT-05/30/2022 MEDICATIONS: The patient is currently admitted to the hospital and receiving intravenous antibiotics. The antibiotics were administered within an appropriate time frame prior to the initiation of the procedure. ANESTHESIA/SEDATION: Moderate (conscious) sedation was employed during this procedure as administered by the Interventional Radiology RN. A total of Versed 1 mg and Fentanyl 50 mcg was administered intravenously. Moderate Sedation Time: 17 minutes. The patient's level of consciousness and vital signs were monitored continuously by radiology nursing throughout the procedure under my direct supervision. CONTRAST:  None COMPLICATIONS: None immediate. PROCEDURE: RADIATION DOSE REDUCTION: This exam was performed according to the departmental dose-optimization program which includes automated exposure control, adjustment of the mA and/or kV according to patient size and/or use of iterative reconstruction technique. Informed written consent was obtained from the patient after a discussion of the risks, benefits and alternatives to treatment. The patient was placed supine on the CT gantry and a pre procedural CT was  performed re-demonstrating the known moderate sized right-sided hydropneumothorax. The  procedure was planned. A timeout was performed prior to the initiation of the procedure. The skin overlying the right mid clavicular line was prepped and draped in the usual sterile fashion. The overlying soft tissues were anesthetized with 1% lidocaine with epinephrine. An 18 gauge trocar needle was advanced into the right pleural space and a short Amplatz wire was coiled apical aspect of the pleural space. Appropriate position was confirmed with CT imaging. Next, the track was dilated allowing placement of a 12 [place] all-purpose drainage catheter. Appropriate positioning was confirmed with a limited postprocedural CT scan. tube was connected to a pleura vac device and sutured in place. A dressing was applied. The patient tolerated the procedure well without immediate post procedural complication. IMPRESSION: Successful CT guided placement of a 59 French all purpose drain catheter into the right pleural space for an enlarging symptomatic pneumothorax. Electronically Signed   By: [name] M.D.   On: 06/07/2022 17:36   DG Chest Port 1 View  Result Date: 06/07/2022 CLINICAL DATA:  66 year old male with non-small cell lung cancer. Loculated pneumothorax, pneumomediastinum on restaging CT earlier this month. EXAM: PORTABLE CHEST 1 VIEW COMPARISON:  Chest radiographs 06/06/2022 and earlier. FINDINGS: Portable AP upright view at 0622 hours. Emphysema with architectural distortion, lung volumes are stable. Stable right chest power port. Evidence of regressed pneumomediastinum since 05/30/2022. No pleural edge visible now however, the central junction line remains displaced to the left. Mild if any leftward mediastinal shift is stable. Stable cardiac size and mediastinal contours. No pulmonary edema. Negative visible bowel gas.  Stable visualized osseous structures. IMPRESSION: 1. Evidence of ongoing loculated anterior  pneumothorax shifting the pulmonary junction line to the left. Mild if any mediastinal mass effect is stable from yesterday. 2. Underlying advanced chronic lung disease, Emphysema. No new cardiopulmonary abnormality. Electronically Signed   By: Genevie Ann M.D.   On: 06/07/2022 06:35   DG Chest 2 View  Addendum Date: 06/06/2022   ADDENDUM REPORT: 06/06/2022 15:54 ADDENDUM: Findings discussed with [name]. The most recent chest x-ray is from March 13, 2022, not May 30, 2022 as dictated above. The CT was on 05/30/2022. There does appear to be more midline shift to the left on the current chest x-ray compared to the recent CT. Electronically Signed   By: Franchot Gallo M.D.   On: 06/06/2022 15:54   Result Date: 06/06/2022 CLINICAL DATA:  Follow-up pneumothorax.  Lung cancer EXAM: CHEST - 2 VIEW COMPARISON:  Chest 05/30/2022.  CT chest 05/30/2022 FINDINGS: Heart and mediastinal structures are displaced to the left which is new since the prior study. This could be due to tension type pneumothorax. Pleural line not definitely seen on this view. Surgical staple line in the right apex. The pneumothorax was loculated anteriorly on CT and may have expanded in the interval. Small right pleural effusion similar to the prior study. Small left effusion and left lower lobe atelectasis improved. Negative for heart failure or edema. Port-A-Cath tip lower SVC unchanged IMPRESSION: 1. New displacement of the heart and mediastinal structures to the left. This could be due to tension type pneumothorax which is loculated anteriorly. Consider follow-up chest CT. 2. Small right pleural effusion unchanged. 3. Improvement in left lower lobe atelectasis and effusion. 4. Call report pending Electronically Signed: By: Franchot Gallo M.D. On: 06/06/2022 15:42   CT Chest Wo Contrast  Result Date: 06/01/2022 CLINICAL DATA:  Restaging non-small cell lung cancer. * Tracking Code: BO * EXAM: CT CHEST, ABDOMEN AND PELVIS WITHOUT  CONTRAST TECHNIQUE: Multidetector CT imaging of the chest, abdomen and pelvis was performed following the standard protocol without IV contrast. RADIATION DOSE REDUCTION: This exam was performed according to the departmental dose-optimization program which includes automated exposure control, adjustment of the mA and/or kV according to patient size and/or use of iterative reconstruction technique. COMPARISON:  01/25/2022 FINDINGS: CT CHEST FINDINGS Cardiovascular: Heart size appears within normal limits. Aortic atherosclerosis. Coronary artery calcifications. No pericardial effusion. Mediastinum/Nodes: No enlarged supraclavicular, axillary, or mediastinal lymph nodes. Hilar lymph nodes are suboptimally evaluated due to lack of IV contrast. New pneumomediastinum is identified with gas tracking into bilateral supraclavicular regions and right lower neck. Pneumomediastinum also extends into the ventral lower chest. Lungs/Pleura: Unchanged appearance of small left pleural effusion with overlying compressive type atelectasis. Right pleural effusion which appears small to moderate in size may be partially locule and appears similar in volume to the previous exam. Overlying area of chronic appearing atelectasis containing some calcifications noted, image 28/2. Postoperative change from previous right upper lobe resection identified. There is severe changes of bullous emphysema. There is a new anterior pneumothorax which overlies the right middle lobe and right lobe measuring 8.3 x 5.1 by 15.8 cm, image 63/6 and image 87/4. This contains some internal areas of septation. Unchanged appearance of nodular architectural distortion and scarring identified along the posterior and lateral subpleural left upper lobe, image 28/4. No new pulmonary nodule or mass identified. Musculoskeletal: Mixed lytic and sclerotic lesion involving the left scapula is again noted in appears similar to the previous exam, image 15/4. Stable sclerosis  associated with the lateral aspect of the left third rib which may reflect a underlying remote rib fracture. CT ABDOMEN PELVIS FINDINGS Hepatobiliary: No focal liver abnormality is seen. No gallstones, gallbladder wall thickening, or biliary dilatation. Pancreas: Unremarkable. No pancreatic ductal dilatation or surrounding inflammatory changes. Spleen: Normal in size without focal abnormality. Adrenals/Urinary Tract: Normal adrenal glands. No suspicious kidney mass. No nephrolithiasis or hydronephrosis. Urinary bladder is unremarkable. Stomach/Bowel: Wall thickening involving the stomach is again noted and appears similar to the previous exam. No bowel wall thickening, inflammation, or distension. Vascular/Lymphatic: Aortic atherosclerosis. No signs of abdominopelvic adenopathy. Reproductive: Prostate is unremarkable. Other: No abdominal wall hernia or abnormality. No abdominopelvic ascites. Musculoskeletal: No acute or significant osseous findings. IMPRESSION: 1. New anterior pneumothorax overlies the right middle lobe and right lobe measuring 8.3 x 5.1 by 15.8 cm (volume = 350 cm^3). This contains some internal areas of septation, which suggests this may represent a chronic finding. There is also pneumomediastinum with gas tracking into bilateral supraclavicular regions and right lower neck. Pneumomediastinum also extends into the ventral lower chest. 2. Unchanged appearance of bilateral pleural effusions with overlying compressive type atelectasis. No specific findings identified to suggest locally recurrent tumor within the chest or new sites of metastatic disease within the chest, abdomen or pelvis. 3. Stable appearance of treated metastasis involving the left scapula. 4. Unchanged appearance of chronic gastric wall thickening. 5. Aortic Atherosclerosis (ICD10-I70.0) and Emphysema (ICD10-J43.9). Critical Value/emergent results were called by telephone at the time of interpretation on 06/01/2022 at 12:41 pm to  provider Zola Button MD, who verbally acknowledged these results. Electronically Signed   By: Kerby Moors M.D.   On: 06/01/2022 12:43   CT Abdomen Pelvis Wo Contrast  Result Date: 06/01/2022 CLINICAL DATA:  Restaging non-small cell lung cancer. * Tracking Code: BO * EXAM: CT CHEST, ABDOMEN AND PELVIS WITHOUT CONTRAST TECHNIQUE: Multidetector CT imaging of the chest, abdomen and pelvis  was performed following the standard protocol without IV contrast. RADIATION DOSE REDUCTION: This exam was performed according to the departmental dose-optimization program which includes automated exposure control, adjustment of the mA and/or kV according to patient size and/or use of iterative reconstruction technique. COMPARISON:  01/25/2022 FINDINGS: CT CHEST FINDINGS Cardiovascular: Heart size appears within normal limits. Aortic atherosclerosis. Coronary artery calcifications. No pericardial effusion. Mediastinum/Nodes: No enlarged supraclavicular, axillary, or mediastinal lymph nodes. Hilar lymph nodes are suboptimally evaluated due to lack of IV contrast. New pneumomediastinum is identified with gas tracking into bilateral supraclavicular regions and right lower neck. Pneumomediastinum also extends into the ventral lower chest. Lungs/Pleura: Unchanged appearance of small left pleural effusion with overlying compressive type atelectasis. Right pleural effusion which appears small to moderate in size may be partially locule and appears similar in volume to the previous exam. Overlying area of chronic appearing atelectasis containing some calcifications noted, image 28/2. Postoperative change from previous right upper lobe resection identified. There is severe changes of bullous emphysema. There is a new anterior pneumothorax which overlies the right middle lobe and right lobe measuring 8.3 x 5.1 by 15.8 cm, image 63/6 and image 87/4. This contains some internal areas of septation. Unchanged appearance of nodular  architectural distortion and scarring identified along the posterior and lateral subpleural left upper lobe, image 28/4. No new pulmonary nodule or mass identified. Musculoskeletal: Mixed lytic and sclerotic lesion involving the left scapula is again noted in appears similar to the previous exam, image 15/4. Stable sclerosis associated with the lateral aspect of the left third rib which may reflect a underlying remote rib fracture. CT ABDOMEN PELVIS FINDINGS Hepatobiliary: No focal liver abnormality is seen. No gallstones, gallbladder wall thickening, or biliary dilatation. Pancreas: Unremarkable. No pancreatic ductal dilatation or surrounding inflammatory changes. Spleen: Normal in size without focal abnormality. Adrenals/Urinary Tract: Normal adrenal glands. No suspicious kidney mass. No nephrolithiasis or hydronephrosis. Urinary bladder is unremarkable. Stomach/Bowel: Wall thickening involving the stomach is again noted and appears similar to the previous exam. No bowel wall thickening, inflammation, or distension. Vascular/Lymphatic: Aortic atherosclerosis. No signs of abdominopelvic adenopathy. Reproductive: Prostate is unremarkable. Other: No abdominal wall hernia or abnormality. No abdominopelvic ascites. Musculoskeletal: No acute or significant osseous findings. IMPRESSION: 1. New anterior pneumothorax overlies the right middle lobe and right lobe measuring 8.3 x 5.1 by 15.8 cm (volume = 350 cm^3). This contains some internal areas of septation, which suggests this may represent a chronic finding. There is also pneumomediastinum with gas tracking into bilateral supraclavicular regions and right lower neck. Pneumomediastinum also extends into the ventral lower chest. 2. Unchanged appearance of bilateral pleural effusions with overlying compressive type atelectasis. No specific findings identified to suggest locally recurrent tumor within the chest or new sites of metastatic disease within the chest, abdomen  or pelvis. 3. Stable appearance of treated metastasis involving the left scapula. 4. Unchanged appearance of chronic gastric wall thickening. 5. Aortic Atherosclerosis (ICD10-I70.0) and Emphysema (ICD10-J43.9). Critical Value/emergent results were called by telephone at the time of interpretation on 06/01/2022 at 12:41 pm to provider Zola Button MD, who verbally acknowledged these results. Electronically Signed   By: Kerby Moors M.D.   On: 06/01/2022 12:43    Labs:  CBC: Recent Labs    01/25/22 1006 03/13/22 1755 03/14/22 0303 05/30/22 1042  WBC 10.1 11.0* 8.5 9.1  HGB 9.2* 9.6* 8.6* 10.2*  HCT 26.0* 26.4* 24.7* 28.1*  PLT 523* 534* 479* 477*    COAGS: No results for input(s): "INR", "APTT"  in the last 8760 hours.  BMP: Recent Labs    01/25/22 1006 03/13/22 1755 03/14/22 0303 05/30/22 1042  NA 125* 123* 125* 126*  K 3.7 3.1* 3.5 3.8  CL 91* 89* 91* 91*  CO2 _0 GLUCOSE 87 97 115* 103*  BUN _1 CALCIUM 9.0 8.6* 8.8* 8.7*  CREATININE 1.62* 1.73* 1.68* 1.43*  GFRNONAA 47* 43* 45* 54*    LIVER FUNCTION TESTS: Recent Labs    11/07/21 1415 01/25/22 1006 03/14/22 0042 05/30/22 1042  BILITOT 0.4 0.3 0.6 0.4  AST _2 ALT _3 ALKPHOS 112 117 103 116  PROT 6.8 7.2 6.1* 7.2  ALBUMIN 2.9* 3.5 2.4* 3.3*    TUMOR MARKERS: No results for input(s): "AFPTM", "CEA", "CA199", "CHROMGRNA" in the last 8760 hours.  Assessment and Plan:  66 y/o M with history of stage IIIa non-small cell lung cancer s/p right upper lobectomy seen today for left chest tube placement due to spontaneous pneumothorax.  Patient underwent right chest tube placement 06/07/22 in IR for spontaneous right pneumothorax and currently has a mini express in place that is being managed by CT surgery.   Last PO intake around noon -- patient agreeable to lidocaine only with potentially IV fentanyl PRN for pain.  Risks and benefits discussed with the patient including  bleeding, infection, damage to adjacent structures, bowel perforation/fistula connection, and sepsis.  All of the patient's questions were answered, patient is agreeable to proceed.  Consent signed and in chart.  Thank you for this interesting consult.  I greatly enjoyed meeting Sean Rose and look forward to participating in their care.  A copy of this report was sent to the requesting provider on this date.  Electronically Signed: Joaquim Nam, PA-C 06/25/2022, 3:39 PM   I spent a total of 40 Minutes  in face to face in clinical consultation, greater than 50% of which was counseling/coordinating care for left chest tube placement.

## 2022-06-25 NOTE — Procedures (Signed)
Interventional Radiology Procedure Note  Procedure: CT guided left chest tube placement (12 fr)  Indication: Left pneumothorax  Findings: Please refer to procedural dictation for full description.  Complications: None  EBL: < 10 mL  Miachel Roux, MD [phone]

## 2022-06-25 NOTE — Progress Notes (Signed)
Patient returned to 4E from IR. Left chest tube placed and put on wall suction at 20. Patient oriented to unit and staff. Having some nausea. Zofran administered.  [name]

## 2022-06-25 NOTE — Progress Notes (Signed)
[address]       [place] [address]             [phone]     HPI: Sean Rose returns for follow-up of his right spontaneous pneumothorax with persistent air leak.  Sean Rose is a 66 year old man with a history of tobacco abuse, severe COPD, stage IIIa non-small cell lung cancer, status post right upper lobectomy, hypertension, hyperlipidemia, CAD, PAD, reflux, heart murmur, and anemia.  He had a right upper lobectomy in 2020 for a T3, N1, stage IIIa non-small cell carcinoma.  Postoperative course was complicated by an air leak that lasted for about a month.  On 06/03/2022 he had a routine follow-up CT which showed a loculated pneumothorax and pneumomediastinum.  He was symptomatic so he was admitted and had a pigtail catheter placed.  He went home with a tube in place with a mini express.  I saw him in the office last week.  He still had an air leak.  He was tolerating the tube well.  Yesterday he had an episode of pain along his left clavicle and shoulder similar to the pain he experienced prior to being diagnosed with a right pneumothorax.  He says he has these pains periodically but yesterday was more severe.  However today he is not having any pain.  Radiology called to notify us of a new left spontaneous pneumothorax.  Past Medical History:  Diagnosis Date   Allergy    Anemia    Anginal pain ([facility])    Blood transfusion without reported diagnosis    CAD (coronary artery disease)    CAP (community acquired pneumonia) 09/2016   COPD (chronic obstructive pulmonary disease) (HCC)    Emphysema of lung (HCC)    GERD (gastroesophageal reflux disease)    Heart murmur    "I was told I had one when I was a kid"   Hemorrhoids    History of anal fissures    "no surgeries" (10/30/2016)   Hyperlipidemia    Hypertension    lung ca dx'd 08/2018   with mets to bones in arms   Myocardial infarction [facility])    Peripheral arterial disease ([facility])    status post  right common iliac artery stenting back in 2007   Seasonal allergies    Tobacco abuse    Past Surgical History:  Procedure Laterality Date   ANKLE SURGERY Left    "rebuilt it"   ANTERIOR CRUCIATE LIGAMENT REPAIR Right    CARDIAC CATHETERIZATION  09/23/2008   Continued medical therapy - may need GI evaluation in addition.   CARDIAC CATHETERIZATION  10/28/2007   Medical therapy recommended.   CARDIAC CATHETERIZATION  11/18/2006   In-stent restenosis RCA  (50% distal edge, 80% segmental mid, and 50-60% segmental proximal). Successful cutting balloon atherectomy using a [id number] cutting balloon. 3 inflations with atherectomy performed on mid and proximal portions resulting in reduction of 80% mid in-stent restenosis to less than 20% residual and 50-60% segmental proximal to less than 20% residual without dissection.   CARDIAC CATHETERIZATION  02/26/2006   Severe stenosis in RCA. Stenting performed using IVUS. 3.5x20 Maverick balloon deployed at [employer]. Distal stent-a 4x28 Liberte stent-deployed 12atm 48sec, 12atm 31sec, 4atm 19sec. Mid stent-a 4x28 Liberte stent-deployed 14atm 45sec, 14atm 60sec, 14atm 44sec. Proximal stent-4x8 Liberte- 14atm 45sec,14atm 47sec, 16atm 43sec. Severely diseased segment then appeared TIMI-3 flow.   CARDIOVASCULAR STRESS TEST  11/17/2012   No significant ECG changes. Septal  perfusion defect is new when complared to study from 2010. Abnormal myocardial perfusion imaging with a basal to mid perfusion suggestive of previous MI.   CAROTID DOPPLER  08/09/2011   Bilateral Bulb/Proximal ICA - demonstrated a mild amount of fibrous plaque without evidence of significant diameter reduction reduction or other vascular abnormality.   CHEST TUBE INSERTION Right 09/02/2018   Procedure: Chest Tube Insertion;  Surgeon: Garner Nash, DO;  Location: [facility];  Service: Thoracic;  Laterality: Right;   COLONOSCOPY     2003, 2014   CORONARY ANGIOPLASTY WITH STENT PLACEMENT     FEMORAL ARTERY  STENT     INGUINAL HERNIA REPAIR Right    IR IMAGING GUIDED PORT INSERTION  11/13/2020   KNEE ARTHROSCOPY Right "multiple"   LAPAROSCOPIC APPENDECTOMY N/A 12/07/2018   Procedure: APPENDECTOMY LAPAROSCOPIC;  Surgeon: Coralie Keens, MD;  Location: [facility];  Service: General;  Laterality: N/A;   LOWER EXTREMITY ARTERIAL DOPPLER  01/31/2011   Bilateral ABIs-normal values with no suggestion of arterial insuff to the lower extremities at rest. Right CIA stent-mild amount of nonhemodynamically significant plaque is noted throughout   [place]   "bone-eating tumor"   PERCUTANEOUS STENT INTERVENTION  04/04/2006 & 04/13/2015   a. Right common iliac artery with an 8.0x18 mm Herculink stent deployed at 12 atm. Stenosis was reduced from 80% to 0% with brisk flow. b. I-cast stenting to left common iliac artery   PERIPHERAL VASCULAR CATHETERIZATION N/A 04/13/2015   Procedure: Lower Extremity Angiography;  Surgeon: Lorretta Harp, MD; L-oCIA 75%, 40-50% L-EIA, R-CIA stent patent, s/p 8 mm x 38 mm ICast covered stent>>0% stenosis in [place] Right    TRANSTHORACIC ECHOCARDIOGRAM  11/26/2012   EF not noted. Aortic valve-sclerosis without stenosis, no regurgiation.    UPPER GASTROINTESTINAL ENDOSCOPY     US CAROTID DOPPLER BILATERAL ([facility])  08/09/2011   Bilateral Bulb/Proximal ICAa demonstrated a mild amount of fibrous plaque without evidence of significant diameter reduction or any other vascular abnormality.   VIDEO ASSISTED THORACOSCOPY (VATS)/ LOBECTOMY Right 09/14/2018   Procedure: RIGHT VIDEO ASSISTED THORACOSCOPY (VATS)/ RIGHT UPPER LOBECTOMY;  Surgeon: Melrose Nakayama, MD;  Location: [facility];  Service: Thoracic;  Laterality: Right;   VIDEO BRONCHOSCOPY WITH ENDOBRONCHIAL NAVIGATION N/A 09/02/2018   Procedure: VIDEO BRONCHOSCOPY WITH ENDOBRONCHIAL NAVIGATION;  Surgeon: Garner Nash, DO;  Location: [facility];  Service: Thoracic;  Laterality:  N/A;    Current Outpatient Medications  Medication Sig Dispense Refill   acetaminophen (TYLENOL) 500 MG tablet Take 1,000 mg by mouth every 6 (six) hours as needed for headache.     amLODipine (NORVASC) 5 MG tablet TAKE 1 TABLET BY MOUTH DAILY 180 tablet 0   aspirin EC 81 MG tablet Take 81 mg by mouth daily.     clopidogrel (PLAVIX) 75 MG tablet TAKE 1 TABLET BY MOUTH EVERY DAY (Patient taking differently: Take 75 mg by mouth daily.) 90 tablet 3   esomeprazole (NEXIUM) 20 MG capsule Take 40 mg by mouth daily.     fexofenadine (ALLEGRA) 60 MG tablet Take 60 mg by mouth every evening.     Fluticasone-Umeclidin-Vilant (TRELEGY ELLIPTA) 100-62.5-25 MCG/ACT AEPB Inhale 1 puff into the lungs daily. 60 each 3   folic acid (FOLVITE) 1 MG tablet TAKE 1 TABLET(1 MG) BY MOUTH DAILY (Patient taking differently: Take 1 mg by mouth daily.) 30 tablet 4   Magnesium 500 MG TABS Take 1  tablet (500 mg total) by mouth 3 (three) times daily. (Patient taking differently: Take 500 mg by mouth in the morning and at bedtime.) 8 tablet 0   metoprolol succinate (TOPROL-XL) 25 MG 24 hr tablet TAKE 1 TABLET BY MOUTH DAILY 90 tablet 0   mirtazapine (REMERON) 15 MG tablet Take 1 tablet (15 mg total) by mouth at bedtime. 90 tablet 0   Multiple Vitamin (MULTIVITAMIN WITH MINERALS) TABS tablet Take 1 tablet by mouth daily.     oxyCODONE (OXY IR/ROXICODONE) 5 MG immediate release tablet Take 1 tablet (5 mg total) by mouth every 4 (four) hours as needed for moderate pain or severe pain. 30 tablet 0   rosuvastatin (CRESTOR) 5 MG tablet TAKE 1 TABLET(5 MG) BY MOUTH EVERY OTHER DAY 45 tablet 0   No current facility-administered medications for this visit.   Facility-Administered Medications Ordered in Other Visits  Medication Dose Route Frequency Provider Last Rate Last Admin   diphenhydrAMINE (BENADRYL) 25 mg capsule             Physical Exam BP 114/79 (BP Location: Right Arm, Patient Position: Sitting)   Pulse 82   Resp 18    Ht _0  (1.727 m)   SpO2 98% Comment: RA  BMI 15.20 kg/m  Thin 66 year old man in no acute distress but anxious Alert and oriented x3 with no focal deficits Trachea midline, no subcutaneous emphysema Lungs with diminished breath sounds bilaterally Chest tube in place with airleak Cardiac regular rate and rhythm with a 2/6 systolic murmur No peripheral edema  Diagnostic Tests: CHEST - 2 VIEW   COMPARISON:  Chest x-ray dated June 18, 2022.   FINDINGS: Unchanged right chest wall port catheter and right-sided chest tube. Similar postsurgical changes from prior right upper lobectomy with hyperinflation of the remaining right lung. New small to moderate left pneumothorax estimated at 25%. Partial collapse of the left lung. Unchanged small bilateral pleural effusions. Similar emphysematous changes and chronically coarsened interstitial markings. The heart size and mediastinal contours are within normal limits. No acute osseous abnormality.   IMPRESSION: 1. New small to moderate left pneumothorax. 2. Right-sided chest tube remains in place with no residual visible pneumothorax. 3. Unchanged small bilateral pleural effusions. 4. COPD.   Critical Value/emergent results were called by telephone at the time of interpretation on 06/25/2022 at 9:05 am to provider [facility] , who verbally acknowledged these results.     Electronically Signed   By: Titus Dubin M.D.   On: 06/25/2022 09:07  I personally reviewed the chest x-ray images.  There is a moderate left pneumothorax.  Right side unchanged.  Impression: Sean Rose is a 66 year old man with a history of tobacco abuse, severe COPD, stage IIIa non-small cell lung cancer, status post right upper lobectomy, hypertension, hyperlipidemia, CAD, PAD, reflux, heart murmur, and anemia.    About 3 weeks ago he was found to have a right pneumothorax on a scheduled CT scan.  This was loculated but there was a  pneumomediastinum.  He was symptomatic so we admitted him and had a pigtail catheter placed not surprisingly he had a persistent air leak so we let him go home with the tube in place on mini express.  He has tolerated that well, but does still have an air leak.  Yesterday he had an episode of pain similar to the pain he had on the right he managed that with shallow breathing and the pain ultimately resolved.  He is not in any distress  now but his chest x-ray does show a moderate left pneumothorax.  I recommended that we admit him for 24-hour observation.  The plan would be to have interventional radiology place a pigtail catheter on the left side.  If he does well overnight we would let him go home with a mini express on that side.  Hopefully we can achieve complete reexpansion over there and then we can discuss options such as talc pleurodesis to try to prevent recurrence.  He wishes to return home prior to being admitted.  There is a delay due to patient's waiting in the ED and other sites for admission.  There also will be delay in terms of getting the tube placed.  Since he is not in any distress, vital signs are stable, and oxygen saturations are good, I think it is okay for him to go home and wait for the hospital and IR to call.  He does know to go to the emergency room immediately if he experiences more pain or worsening shortness of breath.  Plan:  I spoke with Dr. Serafina Royals with interventional radiology.  They will arrange for a left thoracostomy tube with a pigtail catheter.  We will plan to keep him overnight for 24-hour observation.     I spent 30 minutes in review of records, images, and in consultation and coordination of care for Sean Rose today.  Melrose Nakayama, MD [facility] [phone]

## 2022-06-25 NOTE — H&P (Signed)
[address]       [place] [address]             [phone]      HPI:   Sean Rose returns for follow-up of his right spontaneous pneumothorax with persistent air leak.   Sean Rose is a 66 year old man with a history of tobacco abuse, severe COPD, stage IIIa non-small cell lung cancer, status post right upper lobectomy, hypertension, hyperlipidemia, CAD, PAD, reflux, heart murmur, and anemia.  He had a right upper lobectomy in 2020 for a T3, N1, stage IIIa non-small cell carcinoma.  Postoperative course was complicated by an air leak that lasted for about a month.   On 06/03/2022 he had a routine follow-up CT which showed a loculated pneumothorax and pneumomediastinum.  He was symptomatic so he was admitted and had a pigtail catheter placed.  He went home with a tube in place with a mini express.   [name] saw him in the office last week.  He still had an air leak.  He was tolerating the tube well.   Yesterday he had an episode of pain along his left clavicle and shoulder similar to the pain he experienced prior to being diagnosed with a right pneumothorax.  He says he has these pains periodically but yesterday was more severe.  However today he is not having any pain. Radiology called to notify us of a new left spontaneous pneumothorax.  Past Medical History:  Diagnosis Date   Allergy     Anemia     Anginal pain ([place])     Blood transfusion without reported diagnosis     CAD (coronary artery disease)     CAP (community acquired pneumonia) 09/2016   COPD (chronic obstructive pulmonary disease) (HCC)     Emphysema of lung (HCC)     GERD (gastroesophageal reflux disease)     Heart murmur      "I was told I had one when I was a kid"   Hemorrhoids     History of anal fissures      "no surgeries" (10/30/2016)   Hyperlipidemia     Hypertension     lung ca dx'd 08/2018    with mets to bones in arms   Myocardial infarction [facility])     Peripheral  arterial disease ([place])      status post right common iliac artery stenting back in 2007   Seasonal allergies     Tobacco abuse           Past Surgical History:  Procedure Laterality Date   ANKLE SURGERY Left      "rebuilt it"   ANTERIOR CRUCIATE LIGAMENT REPAIR Right     CARDIAC CATHETERIZATION   09/23/2008    Continued medical therapy - may need GI evaluation in addition.   CARDIAC CATHETERIZATION   10/28/2007    Medical therapy recommended.   CARDIAC CATHETERIZATION   11/18/2006    In-stent restenosis RCA  (50% distal edge, 80% segmental mid, and 50-60% segmental proximal). Successful cutting balloon atherectomy using a [id number] cutting balloon. 3 inflations with atherectomy performed on mid and proximal portions resulting in reduction of 80% mid in-stent restenosis to less than 20% residual and 50-60% segmental proximal to less than 20% residual without dissection.   CARDIAC CATHETERIZATION   02/26/2006    Severe stenosis in RCA. Stenting performed using IVUS. 3.5x20 Maverick balloon deployed at [employer]. Distal stent-a 4x28 Liberte stent-deployed 12atm  48sec, 12atm 31sec, 4atm 19sec. Mid stent-a 4x28 Liberte stent-deployed 14atm 45sec, 14atm 60sec, 14atm 44sec. Proximal stent-4x8 Liberte- 14atm 45sec,14atm 47sec, 16atm 43sec. Severely diseased segment then appeared TIMI-3 flow.   CARDIOVASCULAR STRESS TEST   11/17/2012    No significant ECG changes. Septal perfusion defect is new when complared to study from 2010. Abnormal myocardial perfusion imaging with a basal to mid perfusion suggestive of previous MI.   CAROTID DOPPLER   08/09/2011    Bilateral Bulb/Proximal ICA - demonstrated a mild amount of fibrous plaque without evidence of significant diameter reduction reduction or other vascular abnormality.   CHEST TUBE INSERTION Right 09/02/2018    Procedure: Chest Tube Insertion;  Surgeon: Garner Nash, DO;  Location: [place];  Service: Thoracic;  Laterality: Right;   COLONOSCOPY        2003,  2014   CORONARY ANGIOPLASTY WITH STENT PLACEMENT       FEMORAL ARTERY STENT       INGUINAL HERNIA REPAIR Right     IR IMAGING GUIDED PORT INSERTION   11/13/2020   KNEE ARTHROSCOPY Right "multiple"   LAPAROSCOPIC APPENDECTOMY N/A 12/07/2018    Procedure: APPENDECTOMY LAPAROSCOPIC;  Surgeon: Coralie Keens, MD;  Location: [facility];  Service: General;  Laterality: N/A;   LOWER EXTREMITY ARTERIAL DOPPLER   01/31/2011    Bilateral ABIs-normal values with no suggestion of arterial insuff to the lower extremities at rest. Right CIA stent-mild amount of nonhemodynamically significant plaque is noted throughout   [place]    "bone-eating tumor"   PERCUTANEOUS STENT INTERVENTION   04/04/2006 & 04/13/2015    a. Right common iliac artery with an 8.0x18 mm Herculink stent deployed at 12 atm. Stenosis was reduced from 80% to 0% with brisk flow. b. I-cast stenting to left common iliac artery   PERIPHERAL VASCULAR CATHETERIZATION N/A 04/13/2015    Procedure: Lower Extremity Angiography;  Surgeon: Lorretta Harp, MD; L-oCIA 75%, 40-50% L-EIA, R-CIA stent patent, s/p 8 mm x 38 mm ICast covered stent>>0% stenosis in [place] Right     TRANSTHORACIC ECHOCARDIOGRAM   11/26/2012    EF not noted. Aortic valve-sclerosis without stenosis, no regurgiation.    UPPER GASTROINTESTINAL ENDOSCOPY       US CAROTID DOPPLER BILATERAL ([place] [facility])   08/09/2011    Bilateral Bulb/Proximal ICAa demonstrated a mild amount of fibrous plaque without evidence of significant diameter reduction or any other vascular abnormality.   VIDEO ASSISTED THORACOSCOPY (VATS)/ LOBECTOMY Right 09/14/2018    Procedure: RIGHT VIDEO ASSISTED THORACOSCOPY (VATS)/ RIGHT UPPER LOBECTOMY;  Surgeon: Melrose Nakayama, MD;  Location: [place];  Service: Thoracic;  Laterality: Right;   VIDEO BRONCHOSCOPY WITH ENDOBRONCHIAL NAVIGATION N/A 09/02/2018    Procedure: VIDEO BRONCHOSCOPY WITH ENDOBRONCHIAL  NAVIGATION;  Surgeon: Garner Nash, DO;  Location: [facility];  Service: Thoracic;  Laterality: N/A;            Current Outpatient Medications  Medication Sig Dispense Refill   acetaminophen (TYLENOL) 500 MG tablet Take 1,000 mg by mouth every 6 (six) hours as needed for headache.       amLODipine (NORVASC) 5 MG tablet TAKE 1 TABLET BY MOUTH DAILY 180 tablet 0   aspirin EC 81 MG tablet Take 81 mg by mouth daily.       clopidogrel (PLAVIX) 75 MG tablet TAKE 1 TABLET BY MOUTH EVERY DAY (Patient taking differently: Take 75 mg by mouth  daily.) 90 tablet 3   esomeprazole (NEXIUM) 20 MG capsule Take 40 mg by mouth daily.       fexofenadine (ALLEGRA) 60 MG tablet Take 60 mg by mouth every evening.       Fluticasone-Umeclidin-Vilant (TRELEGY ELLIPTA) 100-62.5-25 MCG/ACT AEPB Inhale 1 puff into the lungs daily. 60 each 3   folic acid (FOLVITE) 1 MG tablet TAKE 1 TABLET(1 MG) BY MOUTH DAILY (Patient taking differently: Take 1 mg by mouth daily.) 30 tablet 4   Magnesium 500 MG TABS Take 1 tablet (500 mg total) by mouth 3 (three) times daily. (Patient taking differently: Take 500 mg by mouth in the morning and at bedtime.) 8 tablet 0   metoprolol succinate (TOPROL-XL) 25 MG 24 hr tablet TAKE 1 TABLET BY MOUTH DAILY 90 tablet 0   mirtazapine (REMERON) 15 MG tablet Take 1 tablet (15 mg total) by mouth at bedtime. 90 tablet 0   Multiple Vitamin (MULTIVITAMIN WITH MINERALS) TABS tablet Take 1 tablet by mouth daily.       oxyCODONE (OXY IR/ROXICODONE) 5 MG immediate release tablet Take 1 tablet (5 mg total) by mouth every 4 (four) hours as needed for moderate pain or severe pain. 30 tablet 0   rosuvastatin (CRESTOR) 5 MG tablet TAKE 1 TABLET(5 MG) BY MOUTH EVERY OTHER DAY 45 tablet 0    No current facility-administered medications for this visit.             Facility-Administered Medications Ordered in Other Visits  Medication Dose Route Frequency Provider Last Rate Last Admin   diphenhydrAMINE (BENADRYL) 25  mg capsule                  Physical Exam BP 114/79 (BP Location: Right Arm, Patient Position: Sitting)   Pulse 82   Resp 18   Ht _0  (1.727 m)   SpO2 98% Comment: RA  BMI 15.20 kg/m  Thin 66 year old man in no acute distress but anxious Alert and oriented x3 with no focal deficits Trachea midline, no subcutaneous emphysema Lungs with diminished breath sounds bilaterally Chest tube in place with airleak Cardiac regular rate and rhythm with a 2/6 systolic murmur No peripheral edema   Diagnostic Tests: CHEST - 2 VIEW   COMPARISON:  Chest x-ray dated June 18, 2022.   FINDINGS: Unchanged right chest wall port catheter and right-sided chest tube. Similar postsurgical changes from prior right upper lobectomy with hyperinflation of the remaining right lung. New small to moderate left pneumothorax estimated at 25%. Partial collapse of the left lung. Unchanged small bilateral pleural effusions. Similar emphysematous changes and chronically coarsened interstitial markings. The heart size and mediastinal contours are within normal limits. No acute osseous abnormality.   IMPRESSION: 1. New small to moderate left pneumothorax. 2. Right-sided chest tube remains in place with no residual visible pneumothorax. 3. Unchanged small bilateral pleural effusions. 4. COPD.   Critical Value/emergent results were called by telephone at the time of interpretation on 06/25/2022 at 9:05 am to provider [facility] , who verbally acknowledged these results.     Electronically Signed   By: Titus Dubin M.D.   On: 06/25/2022 09:07  I personally reviewed the chest x-ray images.  There is a moderate left pneumothorax.  Right side unchanged.   Impression:  Sean Rose is a 66 year old man with a history of tobacco abuse, severe COPD, stage IIIa non-small cell lung cancer, status post right upper lobectomy, hypertension, hyperlipidemia, CAD, PAD, reflux, heart murmur, and  anemia.  About 3 weeks ago he was found to have a right pneumothorax on a scheduled CT scan.  This was loculated but there was a pneumomediastinum.  He was symptomatic so we admitted him and had a pigtail catheter placed not surprisingly he had a persistent air leak so we let him go home with the tube in place on mini express.  He has tolerated that well, but does still have an air leak.   Yesterday he had an episode of pain similar to the pain he had on the right he managed that with shallow breathing and the pain ultimately resolved.  He is not in any distress now but his chest x-ray does show a moderate left pneumothorax.   [name] recommended that we admit him for 24-hour observation.  The plan would be to have interventional radiology place a pigtail catheter on the left side.  If he does well overnight we would let him go home with a mini express on that side.  Hopefully we can achieve complete reexpansion over there and then we can discuss options such as talc pleurodesis to try to prevent recurrence.   He wishes to return home prior to being admitted.  There is a delay due to patient's waiting in the ED and other sites for admission.  There also will be delay in terms of getting the tube placed.  Since he is not in any distress, vital signs are stable, and oxygen saturations are good, I think it is okay for him to go home and wait for the hospital and IR to call.  He does know to go to the emergency room immediately if he experiences more pain or worsening shortness of breath.   Plan:   [name] spoke with Dr. Serafina Royals with interventional radiology.  They will arrange for a left thoracostomy tube with a pigtail catheter.   We will plan to keep him overnight for 24-hour observation.       Magdalene River, PA-C

## 2022-06-25 NOTE — Progress Notes (Signed)
Patient arrived to 4E from home. Vitals taken and stable. Patient alert and oriented. Patient placed on tele and CCMD notified. CHG completed. Call bell within reach and family at the bedside.  Sean Rose

## 2022-06-26 ENCOUNTER — Inpatient Hospital Stay (HOSPITAL_COMMUNITY): Payer: BC Managed Care – PPO

## 2022-06-26 DIAGNOSIS — J939 Pneumothorax, unspecified: Secondary | ICD-10-CM

## 2022-06-26 LAB — CBC
HCT: 25.9 % — ABNORMAL LOW (ref 39.0–52.0)
Hemoglobin: 9 g/dL — ABNORMAL LOW (ref 13.0–17.0)
MCH: 33.2 pg (ref 26.0–34.0)
MCHC: 34.7 g/dL (ref 30.0–36.0)
MCV: 95.6 fL (ref 80.0–100.0)
Platelets: 530 10*3/uL — ABNORMAL HIGH (ref 150–400)
RBC: 2.71 MIL/uL — ABNORMAL LOW (ref 4.22–5.81)
RDW: 13.1 % (ref 11.5–15.5)
WBC: 9.3 10*3/uL (ref 4.0–10.5)
nRBC: 0 % (ref 0.0–0.2)

## 2022-06-26 LAB — BASIC METABOLIC PANEL
Anion gap: 8 (ref 5–15)
BUN: 14 mg/dL (ref 8–23)
CO2: 24 mmol/L (ref 22–32)
Calcium: 8.7 mg/dL — ABNORMAL LOW (ref 8.9–10.3)
Chloride: 95 mmol/L — ABNORMAL LOW (ref 98–111)
Creatinine, Ser: 1.96 mg/dL — ABNORMAL HIGH (ref 0.61–1.24)
GFR, Estimated: 37 mL/min — ABNORMAL LOW (ref >60)
Glucose, Bld: 121 mg/dL — ABNORMAL HIGH (ref 70–99)
Potassium: 3.5 mmol/L (ref 3.5–5.1)
Sodium: 127 mmol/L — ABNORMAL LOW (ref 135–145)

## 2022-06-26 MED ORDER — HEPARIN SOD (PORK) LOCK FLUSH 100 UNIT/ML IV SOLN
500.0000 [IU] | INTRAVENOUS | Status: AC | PRN
  Administered 2022-06-26: 500 [IU]
  Filled 2022-06-26: qty 5

## 2022-06-26 MED ORDER — FOLIC ACID 1 MG PO TABS: 1.0000 mg | ORAL_TABLET | Freq: Every day | ORAL | Status: DC

## 2022-06-26 MED ORDER — MAGNESIUM 500 MG PO TABS: 500.0000 mg | ORAL_TABLET | Freq: Two times a day (BID) | ORAL | Status: DC

## 2022-06-26 MED ORDER — CLOPIDOGREL BISULFATE 75 MG PO TABS: 75.0000 mg | ORAL_TABLET | Freq: Every day | ORAL | Status: DC

## 2022-06-26 NOTE — Telephone Encounter (Signed)
Okay for refill?  

## 2022-06-26 NOTE — Discharge Summary (Addendum)
Physician Discharge Summary  Patient ID: Sean Rose MRN: [id number] DOB/AGE: 1956-01-26 66 y.o.  Admit date: 06/25/2022 Discharge date: 06/26/2022  Admission Diagnoses: Left pneumothorax Patient Active Problem List   Diagnosis Date Noted   Pneumothorax 06/25/2022   Spontaneous pneumothorax 06/06/2022   CKD (chronic kidney disease) stage 3, GFR 30-59 ml/min (HCC) 03/14/2022   Antineoplastic chemotherapy induced anemia 08/01/2021   Intractable nausea and vomiting 03/01/2021   Generalized abdominal pain 03/01/2021   Elevated lactic acid level 03/01/2021   Port-A-Cath in place 11/21/2020   Adenocarcinoma of right lung, stage 4 ([place]) 10/30/2020   Encounter for antineoplastic immunotherapy 10/30/2020   Bone metastasis 10/01/2020   Abdominal pain 12/07/2018   Appendicitis 12/07/2018   Goals of care, counseling/discussion 10/13/2018   Encounter for antineoplastic chemotherapy 10/13/2018   S/P Right VATS with Right Upper Lobectomy, Lymph Node Sampling, Intercostal Nerve Block 09/14/2018   Pneumothorax after biopsy 09/02/2018   Stage 1 mild COPD by GOLD classification ([place]) 07/30/2018   Syncope 07/23/2018   Antiplatelet or antithrombotic long-term use 07/14/2018   Nodule of upper lobe of right lung 07/14/2018   Multiple lung nodules on CT 07/14/2018   Nausea vomiting and diarrhea 10/30/2016   Hyponatremia 10/30/2016   Hypocalcemia 10/30/2016   Hypokalemia 04/14/2015   Hypomagnesemia 04/14/2015   Claudication ([place]) 04/13/2015   Essential hypertension 01/14/2014   Hyperlipidemia 01/14/2014   Thrombosed external hemorrhoids 12/07/2013   Chronic anticoagulation 01/20/2013   PVD (peripheral vascular disease) ([place]) 01/20/2013   Coronary atherosclerosis of native coronary artery 01/20/2013   GERD (gastroesophageal reflux disease) 01/20/2013   Benign esophageal stricture 01/20/2013   DENTAL CARIES 07/20/2010    Discharge Diagnoses:  Principal Problem:   Pneumothorax  Patient  Active Problem List   Diagnosis Date Noted   Pneumothorax 06/25/2022   Spontaneous pneumothorax 06/06/2022   CKD (chronic kidney disease) stage 3, GFR 30-59 ml/min (HCC) 03/14/2022   Antineoplastic chemotherapy induced anemia 08/01/2021   Intractable nausea and vomiting 03/01/2021   Generalized abdominal pain 03/01/2021   Elevated lactic acid level 03/01/2021   Port-A-Cath in place 11/21/2020   Adenocarcinoma of right lung, stage 4 ([place]) 10/30/2020   Encounter for antineoplastic immunotherapy 10/30/2020   Bone metastasis 10/01/2020   Abdominal pain 12/07/2018   Appendicitis 12/07/2018   Goals of care, counseling/discussion 10/13/2018   Encounter for antineoplastic chemotherapy 10/13/2018   S/P Right VATS with Right Upper Lobectomy, Lymph Node Sampling, Intercostal Nerve Block 09/14/2018   Pneumothorax after biopsy 09/02/2018   Stage 1 mild COPD by GOLD classification ([place]) 07/30/2018   Syncope 07/23/2018   Antiplatelet or antithrombotic long-term use 07/14/2018   Nodule of upper lobe of right lung 07/14/2018   Multiple lung nodules on CT 07/14/2018   Nausea vomiting and diarrhea 10/30/2016   Hyponatremia 10/30/2016   Hypocalcemia 10/30/2016   Hypokalemia 04/14/2015   Hypomagnesemia 04/14/2015   Claudication ([place]) 04/13/2015   Essential hypertension 01/14/2014   Hyperlipidemia 01/14/2014   Thrombosed external hemorrhoids 12/07/2013   Chronic anticoagulation 01/20/2013   PVD (peripheral vascular disease) ([place]) 01/20/2013   Coronary atherosclerosis of native coronary artery 01/20/2013   GERD (gastroesophageal reflux disease) 01/20/2013   Benign esophageal stricture 01/20/2013   DENTAL CARIES 07/20/2010     Discharged Condition: stable  HPI:  Sean Rose returns for follow-up of his right spontaneous pneumothorax with persistent air leak.   Sean Rose is a 66 year old man with a history of tobacco abuse, severe COPD, stage IIIa non-small cell lung cancer, status  post right upper lobectomy, hypertension, hyperlipidemia, CAD, PAD, reflux, heart murmur, and anemia.  He had a right upper lobectomy in 2020 for a T3, N1, stage IIIa non-small cell carcinoma.  Postoperative course was complicated by an air leak that lasted for about a month.   On 06/03/2022 he had a routine follow-up CT which showed a loculated pneumothorax and pneumomediastinum.  He was symptomatic so he was admitted and had a pigtail catheter placed.  He went home with a tube in place with a mini express.   [name] saw him in the office last week.  He still had an air leak.  He was tolerating the tube well.   Yesterday he had an episode of pain along his left clavicle and shoulder similar to the pain he experienced prior to being diagnosed with a right pneumothorax.  He says he has these pains periodically but yesterday was more severe.  However today he is not having any pain. Radiology called to notify us of a new left spontaneous pneumothorax.  [name] believed a left thoracostomy tube would provide this patient the best treatment. He discussed the risks and benefits and Sean Rose was agreeable to proceed.  Hospital Course: Sean Rose was admitted to [facility] on 11/28 and Interventional Radiology placed a CT guided left chest tube (75fr). He tolerated the procedure well and was transferred to the progressive unit in stable condition. The chest tube was placed to suction. He experienced mild pain and nausea overnight that was relieved with pain medication and zofran. The following day the left chest tube showed no sign of an air leak. Right sided chest tube with mini express continued to have an air leak. Follow up chest xray showed trace residual left sided pneumothorax. Sean Rose was saturating well on room air with normal work of breathing. His left chest tube was placed to water seal and was changed to a mini express. He was felt stable for discharge home.    Consults: None  Significant Diagnostic Studies:  CLINICAL DATA:  Pneumothorax follow-up.   EXAM: CHEST - 2 VIEW   COMPARISON:  Chest x-ray dated June 18, 2022.   FINDINGS: Unchanged right chest wall port catheter and right-sided chest tube. Similar postsurgical changes from prior right upper lobectomy with hyperinflation of the remaining right lung. New small to moderate left pneumothorax estimated at 25%. Partial collapse of the left lung. Unchanged small bilateral pleural effusions. Similar emphysematous changes and chronically coarsened interstitial markings. The heart size and mediastinal contours are within normal limits. No acute osseous abnormality.   IMPRESSION: 1. New small to moderate left pneumothorax. 2. Right-sided chest tube remains in place with no residual visible pneumothorax. 3. Unchanged small bilateral pleural effusions. 4. COPD.   Critical Value/emergent results were called by telephone at the time of interpretation on 06/25/2022 at 9:05 am to provider [facility] , who verbally acknowledged these results.     Electronically Signed   By: Titus Dubin M.D.   On: 06/25/2022 09:07   CLINICAL DATA:  Chest tube placement, pneumothorax   EXAM: PORTABLE CHEST 1 VIEW   COMPARISON:  06/25/2022   FINDINGS: Right-sided chest tube in unchanged position. Interval placement of a left-sided chest tube. Trace residual left pneumothorax. No right pneumothorax. Bilateral small pleural effusions. Bibasilar atelectasis. Right-sided Port-A-Cath in satisfactory position. Postsurgical changes from right upper lobectomy. Heart and mediastinal contours are unremarkable.   No acute osseous abnormality.   IMPRESSION: 1. Interval placement of a left-sided  chest tube. Trace residual left pneumothorax. 2. Right-sided chest tube in unchanged position. No right pneumothorax.     Electronically Signed   By: Kathreen Devoid M.D.   On: 06/26/2022  08:21  Treatments:   Interventional Radiology Procedure Note   Procedure: CT guided left chest tube placement (12 fr)   Indication: Left pneumothorax   Findings: Please refer to procedural dictation for full description.   Complications: None   EBL: < 10 mL   Miachel Roux, MD [phone]    PATHOLOGY: N/A  Discharge Exam: Blood pressure 125/83, pulse 67, temperature (!) 97.5 F (36.4 C), temperature source Oral, resp. rate 19, SpO2 98 %. General appearance: alert, cooperative, and no distress Neurologic: intact Heart: regular rate and rhythm, S1, S2 normal, no murmur, click, rub or gallop Lungs: Diminished breath sounds left apex Abdomen: soft, non-tender; bowel sounds normal; no masses,  no organomegaly Extremities: extremities normal, atraumatic, no cyanosis or edema Wound: Clean and dry  Disposition: Discharge disposition: 01-Home or Self Care       Discharge Instructions     Discharge patient   Complete by: As directed    When patient has been changed to a mini express chest tube system for the new tube placed yesterday   Discharge disposition: 01-Home or Self Care   Discharge patient date: 06/26/2022      Allergies as of 06/26/2022       Reactions   Compazine [prochlorperazine] Other (See Comments)   " made him high and could not sleep". He does not want to take it again.        Medication List     TAKE these medications    acetaminophen 500 MG tablet Commonly known as: TYLENOL Take 1,000 mg by mouth every 6 (six) hours as needed for headache.   amLODipine 5 MG tablet Commonly known as: NORVASC TAKE 1 TABLET BY MOUTH DAILY   aspirin EC 81 MG tablet Take 81 mg by mouth daily.   clopidogrel 75 MG tablet Commonly known as: PLAVIX Take 1 tablet (75 mg total) by mouth daily.   esomeprazole 20 MG capsule Commonly known as: NEXIUM Take 40 mg by mouth daily.   fexofenadine 60 MG tablet Commonly known as: ALLEGRA Take 60 mg by mouth  every evening.   folic acid 1 MG tablet Commonly known as: FOLVITE Take 1 tablet (1 mg total) by mouth daily. What changed: See the new instructions.   Magnesium 500 MG Tabs Take 1 tablet (500 mg total) by mouth in the morning and at bedtime.   metoprolol succinate 25 MG 24 hr tablet Commonly known as: TOPROL-XL TAKE 1 TABLET BY MOUTH DAILY   mirtazapine 15 MG tablet Commonly known as: REMERON Take 1 tablet (15 mg total) by mouth at bedtime.   multivitamin with minerals Tabs tablet Take 1 tablet by mouth daily.   oxyCODONE 5 MG immediate release tablet Commonly known as: Oxy IR/ROXICODONE Take 1 tablet (5 mg total) by mouth every 4 (four) hours as needed for moderate pain or severe pain.   rosuvastatin 5 MG tablet Commonly known as: CRESTOR TAKE 1 TABLET(5 MG) BY MOUTH EVERY OTHER DAY   Trelegy Ellipta 100-62.5-25 MCG/ACT Aepb Generic drug: Fluticasone-Umeclidin-Vilant Inhale 1 puff into the lungs daily.        Follow-up Information     Melrose Nakayama, MD Follow up on 07/01/2022.   Specialty: Cardiothoracic Surgery Why: To get chest xray (at [facility] imaging located on the first floor of Dr.  Jelissa Espiritu's office building) at 3:30PM, 30 minutes before your appointment 4PM Contact information: [address] [place] [address] [phone]                 Signed: Magdalene River, PA-C  06/26/2022, 11:14 AM

## 2022-06-26 NOTE — Progress Notes (Signed)
Patient chest tube hooked up to minixpress per order for discharge. Patient tolerated well.  Sean Rose

## 2022-06-26 NOTE — Hospital Course (Addendum)
HPI:  Mr. Sean Rose returns for follow-up of his right spontaneous pneumothorax with persistent air leak.   Sean Rose is a 66 year old man with a history of tobacco abuse, severe COPD, stage IIIa non-small cell lung cancer, status post right upper lobectomy, hypertension, hyperlipidemia, CAD, PAD, reflux, heart murmur, and anemia.  He had a right upper lobectomy in 2020 for a T3, N1, stage IIIa non-small cell carcinoma.  Postoperative course was complicated by an air leak that lasted for about a month.   On 06/03/2022 he had a routine follow-up CT which showed a loculated pneumothorax and pneumomediastinum.  He was symptomatic so he was admitted and had a pigtail catheter placed.  He went home with a tube in place with a mini express.   [name] saw him in the office last week.  He still had an air leak.  He was tolerating the tube well.   Yesterday he had an episode of pain along his left clavicle and shoulder similar to the pain he experienced prior to being diagnosed with a right pneumothorax.  He says he has these pains periodically but yesterday was more severe.  However today he is not having any pain. Radiology called to notify us of a new left spontaneous pneumothorax.  [name] believed a left thoracostomy tube would provide this patient the best treatment. He discussed the risks and benefits and [name] was agreeable to proceed.  Hospital Course: [name] was admitted to [facility] on 11/28 and Interventional Radiology placed a CT guided left chest tube (53fr). He tolerated the procedure well and was transferred to the progressive unit in stable condition. The chest tube was placed to suction. He experienced mild pain and nausea overnight that was relieved with pain medication and zofran. The following day the left chest tube showed no sign of an air leak. Right sided chest tube with mini express continued to have an air leak. Follow up chest xray showed  trace residual left sided pneumothorax. [name] was saturating well on room air with normal work of breathing. His left chest tube was placed to water seal and was changed to a mini express. He was felt stable for discharge home.

## 2022-06-26 NOTE — Discharge Instructions (Signed)
Please have patient watch video on Youtube:[facility] mini express. [facility] patient education  General Patient Instructions:  If the tube becomes disconnected, reconnect it immediately and tape it securely. To reconnect the tube: Pinch the chest tube with your fingers to close the chest tube until you can re attach it. If you are able, clean the ends of the chest tube and drain with an alcohol swab before reattaching.   If the tube becomes disconnected AND you cannot put it back together, go to closest Emergency Department  Please do NOT allow the mini express chamber to become full. To empty the fluid from the chamber: First, wash your hands with soap and water. Use a Luer lock syringe (provided by hospital or home health, if arranged) to attach to the front port (twist Luer lock syringe clock wise to to tighten) at the bottom of the mini express. Pull the syringe plunger back, unscrew the syringe and put fluid into the toilet. You may repeat as necessary. If it becomes difficult to empty the fluid in the mini express, squirt water through the port (where syringe attaches to) to flush out the blockage. If this does not work, call the office .  Try to keep mini express upright and below the level of your heart. Also, check periodically that there are no kinks in the tubing.   Changing the chest tube dressing: Please change the dressing at least every other day or if it becomes wet. You will be taught how to change the dressing before you are discharged from the hospital or home health will be arranged to do it for you.   If the chest falls out or gets pulled out: Place a piece of gauze over the site and cover the gauze with tape. Contact our office ASAP [phone]) If you have chest pain or sudden onset of shortness of breath, go to the nearest emergency room

## 2022-06-26 NOTE — Progress Notes (Signed)
Patient refused all morning medication stating he is going home today. This RN educated on importance of taking his medications per MD orders. Patient continued to refuse.  [place] C Enslee Bibbins

## 2022-06-26 NOTE — Progress Notes (Signed)
Patient discharging home with support from wife. Port deaccessed by IV team RN. Tele removed and CCMD notified. Discharge instructions given. All questions answered.  Sean Rose

## 2022-06-26 NOTE — Progress Notes (Addendum)
      [address]       [place] [address]             [phone]         Subjective: Patient states he has no slept much but feels exceptionally well.  Objective: Vital signs in last 24 hours: Temp:  [97.6 F (36.4 C)-98.6 F (37 C)] 98.6 F (37 C) (11/29 0343) Pulse Rate:  [72-82] 72 (11/29 0343) Cardiac Rhythm: Normal sinus rhythm (11/28 1938) Resp:  [16-21] 20 (11/29 0343) BP: (114-145)/(70-94) 116/83 (11/29 0343) SpO2:  [96 %-100 %] 98 % (11/29 0343)  Hemodynamic parameters for last 24 hours:    Intake/Output from previous day: 11/28 0701 - 11/29 0700 In: 0  Out: 700 [Urine:200; Chest Tube:500] Intake/Output this shift: No intake/output data recorded.  General appearance: alert, cooperative, and no distress Neurologic: intact Heart: regular rate and rhythm, S1, S2 normal, no murmur, click, rub or gallop Lungs: Diminished breath sounds left apex Abdomen: soft, non-tender; bowel sounds normal; no masses,  no organomegaly Extremities: extremities normal, atraumatic, no cyanosis or edema Wound: Clean and dry  Lab Results: Recent Labs    06/26/22 0218  WBC 9.3  HGB 9.0*  HCT 25.9*  PLT 530*   BMET:  Recent Labs    06/26/22 0218  NA 127*  K 3.5  CL 95*  CO2 24  GLUCOSE 121*  BUN 14  CREATININE 1.96*  CALCIUM 8.7*    PT/INR: No results for input(s): "LABPROT", "INR" in the last 72 hours. ABG    Component Value Date/Time   PHART 7.474 (H) 09/15/2018 0750   HCO3 25.0 09/15/2018 0750   TCO2 20 (L) 09/14/2018 1547   ACIDBASEDEF 6.0 (H) 09/14/2018 1547   O2SAT 94.1 09/15/2018 0750   CBG (last 3)  No results for input(s): "GLUCAP" in the last 72 hours.  Assessment/Plan: S/P CT guided left chest tube placement (3fr)  Neuro: Some pain along left clavicle but has improved with pain medication.   CV: NSR some PVCs, HR 60-70s. SBP 116, on home BP medications and Plavix.  Pulm: Saturating 98% on RA. Normal WOB. CXR shows  improved left pneumothorax. Left chest tube placed on suction. No air leak this morning. CT output 400/100 last 2 shifts. Will discuss with surgeon water seal trial. Right sided chest tube with mini express continues to have an air leak.   GI: Some nausea last night, improved with Zofran. On regular diet.  ID: Afebrile, no leukocytosis.  Renal: Cr 1.96. UO 200/24 hours.   Dispo: Will discuss with surgeon, water seal trial today. D/C home today vs tomorrow.    LOS: 1 day    Magdalene River, PA-C 06/26/2022 Patient seen and examined, agree with above No air leak. Ct to water seal Will plan to change to a mini express and send home with tube. Will see in office early next week to consider removal  Remo Lipps C. Roxan Hockey, MD [facility] [phone]

## 2022-06-26 NOTE — Progress Notes (Signed)
Mobility Specialist Progress Note:   06/26/22 0841  Mobility  Activity Refused mobility   Pt in bed stating he's not getting up because he's going home today.   Gareth Eagle Stuart Guillen Mobility Specialist Please contact via [employer] or  Rehab Office at [phone]

## 2022-06-28 ENCOUNTER — Other Ambulatory Visit: Payer: Self-pay | Admitting: Thoracic Surgery (Cardiothoracic Vascular Surgery)

## 2022-06-28 DIAGNOSIS — J930 Spontaneous tension pneumothorax: Secondary | ICD-10-CM

## 2022-07-01 ENCOUNTER — Ambulatory Visit
Admission: RE | Admit: 2022-07-01 | Discharge: 2022-07-01 | Disposition: A | Discharge status: Home / Self Care | Payer: BC Managed Care – PPO | Source: Ambulatory Visit | Attending: Thoracic Surgery (Cardiothoracic Vascular Surgery) | Admitting: Thoracic Surgery (Cardiothoracic Vascular Surgery)

## 2022-07-01 ENCOUNTER — Ambulatory Visit (INDEPENDENT_AMBULATORY_CARE_PROVIDER_SITE_OTHER): Payer: BC Managed Care – PPO | Admitting: Thoracic Surgery (Cardiothoracic Vascular Surgery)

## 2022-07-01 ENCOUNTER — Other Ambulatory Visit: Payer: Self-pay | Admitting: Thoracic Surgery (Cardiothoracic Vascular Surgery)

## 2022-07-01 VITALS — BP 116/77 | HR 83 | Resp 18 | Ht 68.0 in | Wt 100.0 lb

## 2022-07-01 DIAGNOSIS — J930 Spontaneous tension pneumothorax: Secondary | ICD-10-CM

## 2022-07-01 DIAGNOSIS — J939 Pneumothorax, unspecified: Secondary | ICD-10-CM | POA: Diagnosis not present

## 2022-07-01 DIAGNOSIS — Z4682 Encounter for fitting and adjustment of non-vascular catheter: Secondary | ICD-10-CM

## 2022-07-01 DIAGNOSIS — J90 Pleural effusion, not elsewhere classified: Secondary | ICD-10-CM | POA: Diagnosis not present

## 2022-07-01 DIAGNOSIS — I700 Atherosclerosis of aorta: Secondary | ICD-10-CM | POA: Diagnosis not present

## 2022-07-01 NOTE — Progress Notes (Signed)
[address]       [place] [address]             [phone]     HPI: Sean Rose returns for follow-up regarding bilateral pneumothoraces.  Sean Rose is a 66 year old Rose with a history of tobacco abuse, severe COPD, stage IIIa non-small cell cancer, right upper lobectomy, hypertension, hyperlipidemia, CAD, PAD, reflux, heart murmur, anemia, and bilateral pneumothoraces.  Sean Rose had a right upper lobectomy in 2020 for a T3, N1, stage IIIa non-small cell carcinoma.  His postoperative course was complicated by an air leak that lasted for about a month.  On 06/03/2022 had a routine CT which showed a loculated pneumothorax and mediastinum.  Sean Rose was symptomatic so we had a pigtail catheter placed.  Sean Rose went home with a tube on the mini express.  Sean Rose presented back on 11/28 for a follow-up.  Sean Rose was having more trouble breathing and his chest x-ray showed a left spontaneous pneumothorax.  Sean Rose was readmitted and a pigtail catheter was placed by IR on the left side.  Breathing is about the same.  Has coughed up some thick mucus.  Hears crackling noises with breathing.  No fevers or chills.  Past Medical History:  Diagnosis Date   Allergy    Anemia    Anginal pain ([place])    Blood transfusion without reported diagnosis    CAD (coronary artery disease)    CAP (community acquired pneumonia) 09/2016   COPD (chronic obstructive pulmonary disease) (HCC)    Emphysema of lung (H[facility])    GERD (gastroesophageal reflux disease)    Heart murmur    "I was told I had one when I was a kid"   Hemorrhoids    History of anal fissures    "no surgeries" (10/30/2016)   Hyperlipidemia    Hypertension    lung ca dx'd 08/2018   with mets to bones in arms   Myocardial infarction [facility])    Peripheral arterial disease ([place])    status post right common iliac artery stenting back in 2007   Seasonal allergies    Tobacco abuse     Current Outpatient Medications  Medication Sig Dispense Refill    acetaminophen (TYLENOL) 500 MG tablet Take 1,000 mg by mouth every 6 (six) hours as needed for headache.     amLODipine (NORVASC) 5 MG tablet TAKE 1 TABLET BY MOUTH DAILY 180 tablet 0   aspirin EC 81 MG tablet Take 81 mg by mouth daily.     clopidogrel (PLAVIX) 75 MG tablet Take 1 tablet (75 mg total) by mouth daily.     esomeprazole (NEXIUM) 20 MG capsule Take 40 mg by mouth daily.     fexofenadine (ALLEGRA) 60 MG tablet Take 60 mg by mouth every evening.     Fluticasone-Umeclidin-Vilant (TRELEGY ELLIPTA) 100-62.5-25 MCG/ACT AEPB Inhale 1 puff into the lungs daily. 60 each 3   folic acid (FOLVITE) 1 MG tablet Take 1 tablet (1 mg total) by mouth daily.     Magnesium 500 MG TABS Take 1 tablet (500 mg total) by mouth in the morning and at bedtime.     metoprolol succinate (TOPROL-XL) 25 MG 24 hr tablet TAKE 1 TABLET BY MOUTH DAILY 90 tablet 0   mirtazapine (REMERON) 15 MG tablet TAKE 1 TABLET(15 MG) BY MOUTH AT BEDTIME 90 tablet 0   Multiple Vitamin (MULTIVITAMIN WITH MINERALS) TABS tablet Take 1 tablet by mouth daily.  oxyCODONE (OXY IR/ROXICODONE) 5 MG immediate release tablet Take 1 tablet (5 mg total) by mouth every 4 (four) hours as needed for moderate pain or severe pain. 30 tablet 0   rosuvastatin (CRESTOR) 5 MG tablet TAKE 1 TABLET(5 MG) BY MOUTH EVERY OTHER DAY 45 tablet 0   No current facility-administered medications for this visit.   Facility-Administered Medications Ordered in Other Visits  Medication Dose Route Frequency Provider Last Rate Last Admin   diphenhydrAMINE (BENADRYL) 25 mg capsule             Physical Exam BP 116/77 (BP Location: Left Arm, Patient Position: Sitting)   Pulse 83   Resp 18   Ht 5\' 8"  (1.727 m)   Wt 100 lb (45.4 kg)   SpO2 98% Comment: RA  BMI 15.18 kg/m  Sean Rose in no acute distress Alert and oriented x3 Diminished breath sounds No air leak on left, positive air leak on right  Diagnostic Tests: CHEST - 2 VIEW.  Patient is  rotated on frontal view.   COMPARISON:  Chest x-ray 06/25/2022 8:41 a.m.   FINDINGS: Right chest wall Port-A-Cath with tip overlying the expected region of the superior cavoatrial junction.   The heart and mediastinal contours are unchanged. Aortic calcification.   Right chest tube with pigtail overlying the medial mid right hemithorax. Left chest tube with pigtail overlying the left lower medial hemithorax. Likely loculated right pneumothorax noted anteriorly on the lateral view. Persistent small volume pleural effusions.   No acute osseous abnormality.   IMPRESSION: Left chest tube with pigtail overlying the left lower medial hemithorax. Likely loculated right pneumothorax noted anteriorly on the lateral view. Left chest tube with pigtail overlying the left lower medial hemithorax. Likely loculated right pneumothorax noted anteriorly on the lateral view. Persistent small volume pleural effusions.     Electronically Signed   By: Iven Finn M.D.   On: 07/01/2022 15:46 I personally reviewed the chest x-ray images.  There is still a space on the right side.  No pneumo evident on the left side.  Impression: Sean Rose is a 66 year old Rose with a history of tobacco abuse, severe COPD, stage IIIa non-small cell cancer, right upper lobectomy, hypertension, hyperlipidemia, CAD, PAD, reflux, heart murmur, anemia, and bilateral pneumothoraces.  Right spontaneous pneumothorax-still has a leak and the space.  I am very concerned that this will not resolve spontaneously.  Not a good candidate for surgical intervention.  We discussed possibly placing endobronchial valves.  Sean Rose wants to think about that for a while.  Left spontaneous pneumothorax-no air leak.  No residual pneumo on chest x-ray.  Will remove chest tube in the office today.  Sean Rose understands there is always a risk of a recurrent pneumo at the time of chest tube removal or in the future.  We did discuss possibility of  talc pleurodesis prior to tube removal.  Sean Rose is not interested in that approach.  Procedure: Pigtail catheter left chest removed without difficulty.  Patient tolerated well.  Plan: We will repeat a chest x-ray this afternoon and I will see him back in 2 weeks with a repeat chest x-ray to follow-up on the right pneumothorax.  I spent over 20 minutes in review of records, images, and in consultation with Mr. Siefker today. Melrose Nakayama, MD [facility] [phone]

## 2022-07-04 ENCOUNTER — Emergency Department (HOSPITAL_COMMUNITY): Payer: BC Managed Care – PPO

## 2022-07-04 ENCOUNTER — Inpatient Hospital Stay (HOSPITAL_COMMUNITY): Payer: BC Managed Care – PPO

## 2022-07-04 ENCOUNTER — Encounter (HOSPITAL_COMMUNITY): Payer: Self-pay

## 2022-07-04 ENCOUNTER — Inpatient Hospital Stay (HOSPITAL_COMMUNITY)
Admission: EM | Admit: 2022-07-04 | Discharge: 2022-07-11 | DRG: 163 | Disposition: A | Discharge status: Home / Self Care | Payer: BC Managed Care – PPO | Attending: Thoracic Surgery (Cardiothoracic Vascular Surgery) | Admitting: Thoracic Surgery (Cardiothoracic Vascular Surgery)

## 2022-07-04 ENCOUNTER — Other Ambulatory Visit: Payer: Self-pay

## 2022-07-04 DIAGNOSIS — J9382 Other air leak: Secondary | ICD-10-CM | POA: Diagnosis not present

## 2022-07-04 DIAGNOSIS — J9383 Other pneumothorax: Secondary | ICD-10-CM | POA: Diagnosis not present

## 2022-07-04 DIAGNOSIS — C7951 Secondary malignant neoplasm of bone: Secondary | ICD-10-CM | POA: Diagnosis not present

## 2022-07-04 DIAGNOSIS — J439 Emphysema, unspecified: Secondary | ICD-10-CM | POA: Diagnosis not present

## 2022-07-04 DIAGNOSIS — R0902 Hypoxemia: Secondary | ICD-10-CM | POA: Diagnosis not present

## 2022-07-04 DIAGNOSIS — N1831 Chronic kidney disease, stage 3a: Secondary | ICD-10-CM | POA: Diagnosis not present

## 2022-07-04 DIAGNOSIS — N179 Acute kidney failure, unspecified: Secondary | ICD-10-CM | POA: Diagnosis not present

## 2022-07-04 DIAGNOSIS — J9311 Primary spontaneous pneumothorax: Secondary | ICD-10-CM | POA: Diagnosis not present

## 2022-07-04 DIAGNOSIS — I700 Atherosclerosis of aorta: Secondary | ICD-10-CM | POA: Diagnosis not present

## 2022-07-04 DIAGNOSIS — E785 Hyperlipidemia, unspecified: Secondary | ICD-10-CM | POA: Diagnosis not present

## 2022-07-04 DIAGNOSIS — Z681 Body mass index (BMI) 19 or less, adult: Secondary | ICD-10-CM | POA: Diagnosis not present

## 2022-07-04 DIAGNOSIS — R338 Other retention of urine: Secondary | ICD-10-CM | POA: Diagnosis present

## 2022-07-04 DIAGNOSIS — I129 Hypertensive chronic kidney disease with stage 1 through stage 4 chronic kidney disease, or unspecified chronic kidney disease: Secondary | ICD-10-CM | POA: Diagnosis not present

## 2022-07-04 DIAGNOSIS — K219 Gastro-esophageal reflux disease without esophagitis: Secondary | ICD-10-CM | POA: Diagnosis present

## 2022-07-04 DIAGNOSIS — Z902 Acquired absence of lung [part of]: Secondary | ICD-10-CM

## 2022-07-04 DIAGNOSIS — Z7901 Long term (current) use of anticoagulants: Secondary | ICD-10-CM | POA: Diagnosis not present

## 2022-07-04 DIAGNOSIS — Z888 Allergy status to other drugs, medicaments and biological substances status: Secondary | ICD-10-CM | POA: Diagnosis not present

## 2022-07-04 DIAGNOSIS — Z8249 Family history of ischemic heart disease and other diseases of the circulatory system: Secondary | ICD-10-CM | POA: Diagnosis not present

## 2022-07-04 DIAGNOSIS — C3491 Malignant neoplasm of unspecified part of right bronchus or lung: Secondary | ICD-10-CM | POA: Diagnosis present

## 2022-07-04 DIAGNOSIS — Z800 Family history of malignant neoplasm of digestive organs: Secondary | ICD-10-CM | POA: Diagnosis not present

## 2022-07-04 DIAGNOSIS — I2510 Atherosclerotic heart disease of native coronary artery without angina pectoris: Secondary | ICD-10-CM | POA: Diagnosis present

## 2022-07-04 DIAGNOSIS — Z87891 Personal history of nicotine dependence: Secondary | ICD-10-CM

## 2022-07-04 DIAGNOSIS — I739 Peripheral vascular disease, unspecified: Secondary | ICD-10-CM | POA: Diagnosis present

## 2022-07-04 DIAGNOSIS — E860 Dehydration: Secondary | ICD-10-CM | POA: Diagnosis present

## 2022-07-04 DIAGNOSIS — Z1152 Encounter for screening for COVID-19: Secondary | ICD-10-CM | POA: Diagnosis not present

## 2022-07-04 DIAGNOSIS — Z9221 Personal history of antineoplastic chemotherapy: Secondary | ICD-10-CM

## 2022-07-04 DIAGNOSIS — J9811 Atelectasis: Secondary | ICD-10-CM | POA: Diagnosis not present

## 2022-07-04 DIAGNOSIS — I252 Old myocardial infarction: Secondary | ICD-10-CM | POA: Diagnosis not present

## 2022-07-04 DIAGNOSIS — D6481 Anemia due to antineoplastic chemotherapy: Secondary | ICD-10-CM | POA: Diagnosis not present

## 2022-07-04 DIAGNOSIS — R112 Nausea with vomiting, unspecified: Secondary | ICD-10-CM | POA: Diagnosis not present

## 2022-07-04 DIAGNOSIS — J449 Chronic obstructive pulmonary disease, unspecified: Secondary | ICD-10-CM | POA: Diagnosis not present

## 2022-07-04 DIAGNOSIS — K30 Functional dyspepsia: Secondary | ICD-10-CM | POA: Diagnosis present

## 2022-07-04 DIAGNOSIS — E876 Hypokalemia: Secondary | ICD-10-CM | POA: Diagnosis present

## 2022-07-04 DIAGNOSIS — R066 Hiccough: Secondary | ICD-10-CM | POA: Diagnosis not present

## 2022-07-04 DIAGNOSIS — K222 Esophageal obstruction: Secondary | ICD-10-CM | POA: Diagnosis not present

## 2022-07-04 DIAGNOSIS — J90 Pleural effusion, not elsewhere classified: Secondary | ICD-10-CM | POA: Diagnosis not present

## 2022-07-04 DIAGNOSIS — E43 Unspecified severe protein-calorie malnutrition: Secondary | ICD-10-CM | POA: Diagnosis present

## 2022-07-04 DIAGNOSIS — C3490 Malignant neoplasm of unspecified part of unspecified bronchus or lung: Secondary | ICD-10-CM | POA: Diagnosis not present

## 2022-07-04 DIAGNOSIS — J939 Pneumothorax, unspecified: Secondary | ICD-10-CM | POA: Diagnosis not present

## 2022-07-04 DIAGNOSIS — I10 Essential (primary) hypertension: Secondary | ICD-10-CM | POA: Diagnosis not present

## 2022-07-04 DIAGNOSIS — J95812 Postprocedural air leak: Secondary | ICD-10-CM | POA: Diagnosis not present

## 2022-07-04 DIAGNOSIS — R079 Chest pain, unspecified: Secondary | ICD-10-CM | POA: Diagnosis not present

## 2022-07-04 DIAGNOSIS — R069 Unspecified abnormalities of breathing: Secondary | ICD-10-CM | POA: Diagnosis not present

## 2022-07-04 LAB — BASIC METABOLIC PANEL
Anion gap: 12 (ref 5–15)
BUN: 17 mg/dL (ref 8–23)
CO2: 23 mmol/L (ref 22–32)
Calcium: 9.5 mg/dL (ref 8.9–10.3)
Chloride: 95 mmol/L — ABNORMAL LOW (ref 98–111)
Creatinine, Ser: 1.71 mg/dL — ABNORMAL HIGH (ref 0.61–1.24)
GFR, Estimated: 44 mL/min — ABNORMAL LOW (ref >60)
Glucose, Bld: 97 mg/dL (ref 70–99)
Potassium: 3.2 mmol/L — ABNORMAL LOW (ref 3.5–5.1)
Sodium: 130 mmol/L — ABNORMAL LOW (ref 135–145)

## 2022-07-04 LAB — CBC
HCT: 33.5 % — ABNORMAL LOW (ref 39.0–52.0)
Hemoglobin: 11.2 g/dL — ABNORMAL LOW (ref 13.0–17.0)
MCH: 32.2 pg (ref 26.0–34.0)
MCHC: 33.4 g/dL (ref 30.0–36.0)
MCV: 96.3 fL (ref 80.0–100.0)
Platelets: 665 10*3/uL — ABNORMAL HIGH (ref 150–400)
RBC: 3.48 MIL/uL — ABNORMAL LOW (ref 4.22–5.81)
RDW: 13.2 % (ref 11.5–15.5)
WBC: 14.1 10*3/uL — ABNORMAL HIGH (ref 4.0–10.5)
nRBC: 0 % (ref 0.0–0.2)

## 2022-07-04 LAB — TROPONIN I (HIGH SENSITIVITY): Troponin I (High Sensitivity): 8 ng/L (ref <18)

## 2022-07-04 LAB — BRAIN NATRIURETIC PEPTIDE: B Natriuretic Peptide: 68.2 pg/mL (ref 0.0–100.0)

## 2022-07-04 MED ORDER — ENOXAPARIN SODIUM 30 MG/0.3ML IJ SOSY
30.0000 mg | PREFILLED_SYRINGE | Freq: Every day | INTRAMUSCULAR | Status: DC
  Administered 2022-07-04 – 2022-07-09 (×6): 30 mg via SUBCUTANEOUS
  Filled 2022-07-04 (×7): qty 0.3

## 2022-07-04 MED ORDER — FENTANYL CITRATE PF 50 MCG/ML IJ SOSY
100.0000 ug | PREFILLED_SYRINGE | Freq: Once | INTRAMUSCULAR | Status: AC
  Administered 2022-07-04: 100 ug via INTRAVENOUS
  Filled 2022-07-04: qty 2

## 2022-07-04 MED ORDER — ACETAMINOPHEN 650 MG RE SUPP: 650.0000 mg | Freq: Four times a day (QID) | RECTAL | Status: DC | PRN

## 2022-07-04 MED ORDER — MAGNESIUM GLUCONATE 500 MG PO TABS
500.0000 mg | ORAL_TABLET | Freq: Every day | ORAL | Status: DC
  Administered 2022-07-04 – 2022-07-11 (×5): 500 mg via ORAL
  Filled 2022-07-04 (×8): qty 1

## 2022-07-04 MED ORDER — UMECLIDINIUM BROMIDE 62.5 MCG/ACT IN AEPB
1.0000 | INHALATION_SPRAY | Freq: Every day | RESPIRATORY_TRACT | Status: DC
  Administered 2022-07-05 – 2022-07-11 (×7): 1 via RESPIRATORY_TRACT
  Filled 2022-07-04: qty 7

## 2022-07-04 MED ORDER — DOCUSATE SODIUM 100 MG PO CAPS
100.0000 mg | ORAL_CAPSULE | Freq: Two times a day (BID) | ORAL | Status: DC
  Administered 2022-07-04 – 2022-07-09 (×7): 100 mg via ORAL
  Filled 2022-07-04 (×9): qty 1

## 2022-07-04 MED ORDER — FENTANYL CITRATE PF 50 MCG/ML IJ SOSY
12.5000 ug | PREFILLED_SYRINGE | INTRAMUSCULAR | Status: DC | PRN
  Administered 2022-07-04 – 2022-07-05 (×3): 50 ug via INTRAVENOUS
  Administered 2022-07-05 – 2022-07-07 (×2): 25 ug via INTRAVENOUS
  Administered 2022-07-09: 12.5 ug via INTRAVENOUS
  Filled 2022-07-04 (×7): qty 1

## 2022-07-04 MED ORDER — PNEUMOCOCCAL 20-VAL CONJ VACC 0.5 ML IM SUSY
0.5000 mL | PREFILLED_SYRINGE | INTRAMUSCULAR | Status: DC
  Filled 2022-07-04: qty 0.5

## 2022-07-04 MED ORDER — PANTOPRAZOLE SODIUM 40 MG PO TBEC
40.0000 mg | DELAYED_RELEASE_TABLET | Freq: Every day | ORAL | Status: DC
  Administered 2022-07-04 – 2022-07-06 (×3): 40 mg via ORAL
  Filled 2022-07-04 (×3): qty 1

## 2022-07-04 MED ORDER — SODIUM CHLORIDE 0.9% FLUSH
10.0000 mL | Freq: Two times a day (BID) | INTRAVENOUS | Status: DC
  Administered 2022-07-04 – 2022-07-11 (×11): 10 mL

## 2022-07-04 MED ORDER — SODIUM CHLORIDE 0.9% FLUSH: 3.0000 mL | INTRAVENOUS | Status: DC | PRN

## 2022-07-04 MED ORDER — LIDOCAINE-EPINEPHRINE (PF) 2 %-1:200000 IJ SOLN
10.0000 mL | Freq: Once | INTRAMUSCULAR | Status: AC
  Administered 2022-07-04: 10 mL via INTRADERMAL
  Filled 2022-07-04: qty 20

## 2022-07-04 MED ORDER — SODIUM CHLORIDE 0.9% FLUSH: 10.0000 mL | INTRAVENOUS | Status: DC | PRN

## 2022-07-04 MED ORDER — ONDANSETRON HCL 4 MG/2ML IJ SOLN
4.0000 mg | Freq: Once | INTRAMUSCULAR | Status: AC
  Administered 2022-07-04: 4 mg via INTRAVENOUS
  Filled 2022-07-04: qty 2

## 2022-07-04 MED ORDER — METOPROLOL SUCCINATE ER 25 MG PO TB24
25.0000 mg | ORAL_TABLET | Freq: Every day | ORAL | Status: DC
  Administered 2022-07-04 – 2022-07-11 (×7): 25 mg via ORAL
  Filled 2022-07-04 (×7): qty 1

## 2022-07-04 MED ORDER — ONDANSETRON HCL 4 MG/2ML IJ SOLN
4.0000 mg | Freq: Four times a day (QID) | INTRAMUSCULAR | Status: DC | PRN
  Administered 2022-07-06 – 2022-07-07 (×3): 4 mg via INTRAVENOUS
  Filled 2022-07-04 (×4): qty 2

## 2022-07-04 MED ORDER — CHLORHEXIDINE GLUCONATE CLOTH 2 % EX PADS
6.0000 | MEDICATED_PAD | Freq: Every day | CUTANEOUS | Status: DC
  Administered 2022-07-05 – 2022-07-11 (×7): 6 via TOPICAL

## 2022-07-04 MED ORDER — ACETAMINOPHEN 325 MG PO TABS
650.0000 mg | ORAL_TABLET | Freq: Four times a day (QID) | ORAL | Status: DC | PRN
  Administered 2022-07-05 – 2022-07-08 (×7): 650 mg via ORAL
  Filled 2022-07-04 (×7): qty 2

## 2022-07-04 MED ORDER — LORATADINE 10 MG PO TABS
10.0000 mg | ORAL_TABLET | Freq: Every day | ORAL | Status: DC
  Administered 2022-07-04 – 2022-07-11 (×6): 10 mg via ORAL
  Filled 2022-07-04 (×6): qty 1

## 2022-07-04 MED ORDER — ADULT MULTIVITAMIN W/MINERALS CH
1.0000 | ORAL_TABLET | Freq: Every day | ORAL | Status: DC
  Administered 2022-07-04 – 2022-07-11 (×5): 1 via ORAL
  Filled 2022-07-04 (×6): qty 1

## 2022-07-04 MED ORDER — ROSUVASTATIN CALCIUM 5 MG PO TABS
5.0000 mg | ORAL_TABLET | Freq: Every day | ORAL | Status: DC
  Administered 2022-07-04 – 2022-07-11 (×6): 5 mg via ORAL
  Filled 2022-07-04 (×6): qty 1

## 2022-07-04 MED ORDER — FLUTICASONE FUROATE-VILANTEROL 100-25 MCG/ACT IN AEPB
1.0000 | INHALATION_SPRAY | Freq: Every day | RESPIRATORY_TRACT | Status: DC
  Administered 2022-07-05 – 2022-07-11 (×7): 1 via RESPIRATORY_TRACT
  Filled 2022-07-04: qty 28

## 2022-07-04 MED ORDER — AMLODIPINE BESYLATE 5 MG PO TABS
5.0000 mg | ORAL_TABLET | Freq: Every day | ORAL | Status: DC
  Administered 2022-07-04 – 2022-07-11 (×6): 5 mg via ORAL
  Filled 2022-07-04 (×6): qty 1

## 2022-07-04 MED ORDER — SODIUM CHLORIDE 0.9 % IV SOLN: 250.0000 mL | INTRAVENOUS | Status: DC | PRN

## 2022-07-04 MED ORDER — OXYCODONE HCL 5 MG PO TABS: 5.0000 mg | ORAL_TABLET | ORAL | Status: DC | PRN

## 2022-07-04 MED ORDER — FOLIC ACID 1 MG PO TABS
1.0000 mg | ORAL_TABLET | Freq: Every day | ORAL | Status: DC
  Administered 2022-07-04 – 2022-07-11 (×6): 1 mg via ORAL
  Filled 2022-07-04 (×6): qty 1

## 2022-07-04 MED ORDER — SODIUM CHLORIDE 0.9% FLUSH: 3.0000 mL | Freq: Two times a day (BID) | INTRAVENOUS | Status: DC

## 2022-07-04 MED ORDER — CLOPIDOGREL BISULFATE 75 MG PO TABS
75.0000 mg | ORAL_TABLET | Freq: Every day | ORAL | Status: DC
  Administered 2022-07-04 – 2022-07-11 (×6): 75 mg via ORAL
  Filled 2022-07-04 (×6): qty 1

## 2022-07-04 MED ORDER — OXYCODONE HCL 5 MG PO TABS
10.0000 mg | ORAL_TABLET | ORAL | Status: DC | PRN
  Administered 2022-07-04 – 2022-07-10 (×11): 10 mg via ORAL
  Filled 2022-07-04 (×11): qty 2

## 2022-07-04 MED ORDER — MIRTAZAPINE 7.5 MG PO TABS
15.0000 mg | ORAL_TABLET | Freq: Every day | ORAL | Status: DC
  Administered 2022-07-04 – 2022-07-10 (×7): 15 mg via ORAL
  Filled 2022-07-04 (×7): qty 2

## 2022-07-04 MED ORDER — ONDANSETRON HCL 4 MG PO TABS
4.0000 mg | ORAL_TABLET | Freq: Four times a day (QID) | ORAL | Status: DC | PRN
  Filled 2022-07-04: qty 1

## 2022-07-04 MED ORDER — MAGNESIUM HYDROXIDE 400 MG/5ML PO SUSP: 30.0000 mL | Freq: Every day | ORAL | Status: DC | PRN

## 2022-07-04 NOTE — ED Notes (Signed)
Notified MD that access has been obtained and IV team accessing port at this time

## 2022-07-04 NOTE — ED Notes (Signed)
Floyd MD at bedside.  

## 2022-07-04 NOTE — ED Provider Triage Note (Addendum)
Emergency Medicine Provider Triage Evaluation Note  Sean Rose , a 66 y.o. male  was evaluated in triage.  Pt complains of shortness of breath onset yesterday.  Notes he has a past medical history of pneumonia with a chest tube.  Had the tube removed yesterday from his left lung.  Patient has a past medical history of lung cancer as well as pneumothorax.  Has a chest tube still in place to the right lung.  Patient's last chemotherapy treatment was a year ago.  He is treated at the [facility].  Has chest pain.  Has a history of 3 stents placed.  Also notes shortness of breath.  Review of Systems  Positive:  Negative:   Physical Exam  BP (!) 136/94 (BP Location: Right Arm)   Pulse 75   Temp 97.7 F (36.5 C) (Oral)   Resp 18   SpO2 95%  Gen:   Awake, no distress   Resp:  Normal effort  MSK:   Moves extremities without difficulty  Other:  Chest tube in place to right lung without drainage noted.  Clear fluid noted draining from the area.  Medical Decision Making  Medically screening exam initiated at 10:19 AM.  Appropriate orders placed.  Sean Rose was informed that the remainder of the evaluation will be completed by another provider, this initial triage assessment does not replace that evaluation, and the importance of remaining in the ED until their evaluation is complete.  10:29 AM - Discussed with RN that patient is in need of a room immediately. RN aware and working on room placement.    Annalissa Murphey A, PA-C 07/04/22 1030  11:39 AM Discussion with [place], Dr. Chase Caller who notes large left PTX. No mediastinum shift.   11:41 AM - RN notified that patient is in need of a room immediately due to recent CXR findings.     Barbara Keng A, PA-C 07/04/22 1142

## 2022-07-04 NOTE — ED Notes (Signed)
This AM in triage. Initiated labs for Sean Rose. He stated he has a port and wanted the lab drawn from the port. Informed Scarlett RN and she stated ok and that it will get pulled from the port.

## 2022-07-04 NOTE — ED Notes (Signed)
IV team at bedside to access port

## 2022-07-04 NOTE — ED Provider Notes (Signed)
[facility] Provider Note   CSN: [id number] Arrival date & time: 07/04/22  0932     History  Chief Complaint  Patient presents with   Shortness of Breath   Chest Pain    Sean Rose is a 66 y.o. male.  66 yo M with a chief complaints of left-sided chest pain.  Patient unfortunately has suffered from recurrent pneumothoraces bilaterally.  He just had a chest tube removed on Monday about 5 AM developed sharp pain that was reminiscent of what he had had previously on the left side.  Came for evaluation.  Mild cough no fever.  No trauma.   Shortness of Breath Associated symptoms: chest pain   Chest Pain Associated symptoms: shortness of breath        Home Medications Prior to Admission medications   Medication Sig Start Date End Date Taking? Authorizing Provider  acetaminophen (TYLENOL) 500 MG tablet Take 1,000 mg by mouth every 6 (six) hours as needed for headache.   Yes [provider]  amLODipine (NORVASC) 5 MG tablet TAKE 1 TABLET BY MOUTH DAILY 06/24/22  Yes Lorretta Harp, MD  aspirin EC 81 MG tablet Take 81 mg by mouth daily.   Yes [provider]  clopidogrel (PLAVIX) 75 MG tablet Take 1 tablet (75 mg total) by mouth daily. 06/26/22  Yes Gold, Wayne E, PA-C  esomeprazole (NEXIUM) 20 MG capsule Take 40 mg by mouth daily.   Yes [provider]  fexofenadine (ALLEGRA) 60 MG tablet Take 60 mg by mouth every evening.   Yes [provider]  Fluticasone-Umeclidin-Vilant (TRELEGY ELLIPTA) 100-62.5-25 MCG/ACT AEPB Inhale 1 puff into the lungs daily. 04/30/22  Yes Nafziger, Tommi Rumps, NP  folic acid (FOLVITE) 1 MG tablet Take 1 tablet (1 mg total) by mouth daily. 06/26/22  Yes Gold, Wilder Glade, PA-C  Magnesium 500 MG TABS Take 1 tablet (500 mg total) by mouth in the morning and at bedtime. 06/26/22  Yes Gold, Wayne E, PA-C  metoprolol succinate (TOPROL-XL) 25 MG 24 hr tablet TAKE 1 TABLET BY MOUTH DAILY  05/22/22  Yes Lorretta Harp, MD  mirtazapine (REMERON) 15 MG tablet TAKE 1 TABLET(15 MG) BY MOUTH AT BEDTIME Patient taking differently: Take 15 mg by mouth at bedtime. 06/26/22  Yes Nafziger, Tommi Rumps, NP  Multiple Vitamin (MULTIVITAMIN WITH MINERALS) TABS tablet Take 1 tablet by mouth daily.   Yes [provider]  oxyCODONE (OXY IR/ROXICODONE) 5 MG immediate release tablet Take 1 tablet (5 mg total) by mouth every 4 (four) hours as needed for moderate pain or severe pain. 06/10/22  Yes Barrett, Erin R, PA-C  rosuvastatin (CRESTOR) 5 MG tablet TAKE 1 TABLET(5 MG) BY MOUTH EVERY OTHER DAY Patient taking differently: Take 5 mg by mouth daily. 06/24/22  Yes Lorretta Harp, MD      Allergies    Compazine [prochlorperazine]    Review of Systems   Review of Systems  Respiratory:  Positive for shortness of breath.   Cardiovascular:  Positive for chest pain.    Physical Exam Updated Vital Signs BP 117/83 (BP Location: Right Arm)   Pulse 71   Temp 97.9 F (36.6 C) (Oral)   Resp 15   Ht 5\' 8"  (1.727 m)   Wt 43 kg   SpO2 97%   BMI 14.41 kg/m  Physical Exam Vitals and nursing note reviewed.  Constitutional:      Appearance: He is well-developed.  HENT:     Head:  Normocephalic and atraumatic.  Eyes:     Pupils: Pupils are equal, round, and reactive to light.  Neck:     Vascular: No JVD.  Cardiovascular:     Rate and Rhythm: Normal rate and regular rhythm.     Heart sounds: No murmur heard.    No friction rub. No gallop.  Pulmonary:     Effort: No respiratory distress.     Breath sounds: Decreased breath sounds present. No wheezing.     Comments: Diminished breath sounds on the left compared to right. Abdominal:     General: There is no distension.     Tenderness: There is no abdominal tenderness. There is no guarding or rebound.  Musculoskeletal:        General: Normal range of motion.     Cervical back: Normal range of motion and neck supple.  Skin:     Coloration: Skin is not pale.     Findings: No rash.  Neurological:     Mental Status: He is alert and oriented to person, place, and time.  Psychiatric:        Behavior: Behavior normal.     ED Results / Procedures / Treatments   Labs (all labs ordered are listed, but only abnormal results are displayed) Labs Reviewed  BASIC METABOLIC PANEL - Abnormal; Notable for the following components:      Result Value   Sodium 130 (*)    Potassium 3.2 (*)    Chloride 95 (*)    Creatinine, Ser 1.71 (*)    GFR, Estimated 44 (*)    All other components within normal limits  CBC - Abnormal; Notable for the following components:   WBC 14.1 (*)    RBC 3.48 (*)    Hemoglobin 11.2 (*)    HCT 33.5 (*)    Platelets 665 (*)    All other components within normal limits  MRSA NEXT GEN BY PCR, NASAL  BRAIN NATRIURETIC PEPTIDE  TROPONIN I (HIGH SENSITIVITY)    EKG EKG Interpretation  Date/Time:  Thursday July 04 2022 14:17:45 EST Ventricular Rate:  83 PR Interval:  134 QRS Duration: 87 QT Interval:  422 QTC Calculation: 496 R Axis:   70 Text Interpretation: Sinus rhythm Abnormal R-wave progression, early transition Borderline prolonged QT interval No significant change since last tracing Confirmed by Deno Etienne [phone]) on 07/04/2022 2:53:10 PM  Radiology Portable chest 1 View  Result Date: 07/05/2022 CLINICAL DATA:  66 year old male with emphysema and non-small cell lung cancer. Spontaneous bilateral pneumothorax, hydropneumothorax. Bilateral chest tubes. EXAM: PORTABLE CHEST 1 VIEW COMPARISON:  Portable chest 07/04/2022 and earlier. FINDINGS: Portable AP upright view at 0618 hours. Bilateral pigtail chest tubes appears stable. Decreased but not resolved left pneumothorax since yesterday, small residual at the apex. No left pleural effusion identified. Trace if any residual right pneumothorax. Small volume right pleural effusion appears stable. Stable right chest power port. Stable cardiac  size and mediastinal contours. Stable ventilation otherwise. Stable visualized osseous structures. Negative visible bowel gas. IMPRESSION: Stable bilateral chest tubes. Decreased but not resolved left pneumothorax. Trace if any residual right pneumothorax. Stable right pleural effusion. Electronically Signed   By: Genevie Ann M.D.   On: 07/05/2022 07:04   DG Chest Port 1 View  Result Date: 07/04/2022 CLINICAL DATA:  Status post chest tube placement EXAM: PORTABLE CHEST 1 VIEW COMPARISON:  Chest radiograph dated 07/04/2022 at 11:03 a.m., 07/01/2022 FINDINGS: Lines/tubes: Status post left basilar chest tube placement. Similar medial right mid lung  chest tube. Right chest wall port tip projects over the superior cavoatrial junction. Chest: Interval slight re-expansion of the left lung. Pleura: Similar small right pleural effusion. Decreased moderate left pneumothorax. No definite right pneumothorax. Heart/mediastinum: The heart size and mediastinal contours are within normal limits. Bones: No acute osseous abnormality. IMPRESSION: 1. Status post left basilar chest tube placement with slight re-expansion of the left lung. Decreased moderate left pneumothorax. 2. Similar small right pleural effusion. Electronically Signed   By: Darrin Nipper M.D.   On: 07/04/2022 16:31   DG Chest 2 View  Result Date: 07/04/2022 CLINICAL DATA:  Chest pain EXAM: CHEST - 2 VIEW COMPARISON:  Previous studies including the examination of 07/01/2022 FINDINGS: There is interval removal of left chest tube. There is interval appearance of large left pneumothorax with partial atelectasis in left lung. Right chest tube is noted with its tip in the medial right upper lung field. Small right pleural effusion is seen. There is no pneumothorax on the right side. Right IJ chest port is seen with its tip in superior vena cava. Degenerative changes are noted in left shoulder. IMPRESSION: There is a large left pneumothorax with partial atelectasis in left  lung. Small right pleural effusion. Imaging findings were relayed to patient's provider Soijett Blue by telephone call. Electronically Signed   By: Elmer Picker M.D.   On: 07/04/2022 11:41    Procedures CHEST TUBE INSERTION  Date/Time: 07/05/2022 7:07 AM  Performed by: Deno Etienne, DO Authorized by: Deno Etienne, DO   Consent:    Consent obtained:  Verbal   Consent given by:  Patient   Risks, benefits, and alternatives were discussed: yes     Risks discussed:  Bleeding, incomplete drainage and infection   Alternatives discussed:  No treatment, delayed treatment and alternative treatment Universal protocol:    Procedure explained and questions answered to patient or proxy's satisfaction: yes     Immediately prior to procedure, a time out was called: yes     Patient identity confirmed:  Verbally with patient Pre-procedure details:    Skin preparation:  Chlorhexidine   Preparation: Patient was prepped and draped in the usual sterile fashion   Sedation:    Sedation type:  None Anesthesia:    Anesthesia method:  Local infiltration   Local anesthetic:  Lidocaine 2% WITH epi Procedure details:    Placement location:  L lateral   Scalpel size:  11   Tube size (Fr):  8   Ultrasound guidance: no     Tension pneumothorax: no     Tube connected to:  Suction   Drainage characteristics:  Serosanguinous   Suture material:  0 silk   Dressing:  4x4 sterile gauze and Xeroform gauze Post-procedure details:    Post-insertion x-ray findings: tube in good position     Procedure completion:  Tolerated well, no immediate complications .Critical Care  Performed by: Deno Etienne, DO Authorized by: Deno Etienne, DO   Critical care provider statement:    Critical care time (minutes):  35   Critical care time was exclusive of:  Separately billable procedures and treating other patients   Critical care was time spent personally by me on the following activities:  Development of treatment plan with  patient or surrogate, discussions with consultants, evaluation of patient's response to treatment, examination of patient, ordering and review of laboratory studies, ordering and review of radiographic studies, ordering and performing treatments and interventions, pulse oximetry, re-evaluation of patient's condition and review of old charts  Care discussed with: admitting provider       Medications Ordered in ED Medications  sodium chloride flush (NS) 0.9 % injection 10-40 mL (10 mLs Intracatheter Given 07/04/22 2341)  sodium chloride flush (NS) 0.9 % injection 10-40 mL (has no administration in time range)  Chlorhexidine Gluconate Cloth 2 % PADS 6 each (6 each Topical Not Given 07/04/22 1612)  enoxaparin (LOVENOX) injection 30 mg (30 mg Subcutaneous Given 07/04/22 2340)  acetaminophen (TYLENOL) tablet 650 mg (650 mg Oral Given 07/05/22 7106)    Or  acetaminophen (TYLENOL) suppository 650 mg ( Rectal See Alternative 07/05/22 0633)  fentaNYL (SUBLIMAZE) injection 12.5-50 mcg (50 mcg Intravenous Given 07/04/22 2235)  docusate sodium (COLACE) capsule 100 mg (100 mg Oral Given 07/04/22 2340)  magnesium hydroxide (MILK OF MAGNESIA) suspension 30 mL (has no administration in time range)  ondansetron (ZOFRAN) tablet 4 mg (has no administration in time range)    Or  ondansetron (ZOFRAN) injection 4 mg (has no administration in time range)  multivitamin with minerals tablet 1 tablet (1 tablet Oral Given 07/04/22 1856)  amLODipine (NORVASC) tablet 5 mg (5 mg Oral Given 07/04/22 1856)  clopidogrel (PLAVIX) tablet 75 mg (75 mg Oral Given 07/04/22 1856)  pantoprazole (PROTONIX) EC tablet 40 mg (40 mg Oral Given 07/04/22 1856)  loratadine (CLARITIN) tablet 10 mg (10 mg Oral Given 26/9/48 5462)  folic acid (FOLVITE) tablet 1 mg (1 mg Oral Given 07/04/22 1855)  magnesium gluconate (MAGONATE) tablet 500 mg (500 mg Oral Given 07/04/22 1855)  metoprolol succinate (TOPROL-XL) 24 hr tablet 25 mg (25 mg Oral Given 07/04/22  1855)  mirtazapine (REMERON) tablet 15 mg (15 mg Oral Given 07/04/22 2340)  rosuvastatin (CRESTOR) tablet 5 mg (5 mg Oral Given 07/04/22 1855)  fluticasone furoate-vilanterol (BREO ELLIPTA) 100-25 MCG/ACT 1 puff (has no administration in time range)    And  umeclidinium bromide (INCRUSE ELLIPTA) 62.5 MCG/ACT 1 puff (has no administration in time range)  oxyCODONE (Oxy IR/ROXICODONE) immediate release tablet 10 mg (10 mg Oral Given 07/05/22 7035)  pneumococcal 20-valent conjugate vaccine (PREVNAR 20) injection 0.5 mL (has no administration in time range)  lidocaine-EPINEPHrine (XYLOCAINE W/EPI) 2 %-1:200000 (PF) injection 10 mL (10 mLs Intradermal Given by Other 07/04/22 1526)  fentaNYL (SUBLIMAZE) injection 100 mcg (100 mcg Intravenous Given 07/04/22 1522)  ondansetron (ZOFRAN) injection 4 mg (4 mg Intravenous Given 07/04/22 1511)    ED Course/ Medical Decision Making/ A&P                           Medical Decision Making Amount and/or Complexity of Data Reviewed Radiology: ordered.  Risk OTC drugs. Prescription drug management. Decision regarding hospitalization.   66 yo M with a chief complaint of left-sided chest pain.  The patient unfortunately has a history of lung cancer and has had recurrent pneumothoraces.  Had a chest tube removed from the left side couple days ago.  Chest x-ray independently interpreted by me with a recurrent left-sided pneumothorax.  No obvious infiltrate.  I discussed case with [name] who recommended chest tube placement he will admit to the hospital.  Chest tube placed here without issue.  Patient feeling better postprocedure.  CT surgery admission.  The patients results and plan were reviewed and discussed.   Any x-rays performed were independently reviewed by myself.   Differential diagnosis were considered with the presenting HPI.  Medications  sodium chloride flush (NS) 0.9 % injection 10-40 mL (10 mLs Intracatheter Given  07/04/22 2341)  sodium  chloride flush (NS) 0.9 % injection 10-40 mL (has no administration in time range)  Chlorhexidine Gluconate Cloth 2 % PADS 6 each (6 each Topical Not Given 07/04/22 1612)  enoxaparin (LOVENOX) injection 30 mg (30 mg Subcutaneous Given 07/04/22 2340)  acetaminophen (TYLENOL) tablet 650 mg (650 mg Oral Given 07/05/22 3212)    Or  acetaminophen (TYLENOL) suppository 650 mg ( Rectal See Alternative 07/05/22 2482)  fentaNYL (SUBLIMAZE) injection 12.5-50 mcg (50 mcg Intravenous Given 07/04/22 2235)  docusate sodium (COLACE) capsule 100 mg (100 mg Oral Given 07/04/22 2340)  magnesium hydroxide (MILK OF MAGNESIA) suspension 30 mL (has no administration in time range)  ondansetron (ZOFRAN) tablet 4 mg (has no administration in time range)    Or  ondansetron (ZOFRAN) injection 4 mg (has no administration in time range)  multivitamin with minerals tablet 1 tablet (1 tablet Oral Given 07/04/22 1856)  amLODipine (NORVASC) tablet 5 mg (5 mg Oral Given 07/04/22 1856)  clopidogrel (PLAVIX) tablet 75 mg (75 mg Oral Given 07/04/22 1856)  pantoprazole (PROTONIX) EC tablet 40 mg (40 mg Oral Given 07/04/22 1856)  loratadine (CLARITIN) tablet 10 mg (10 mg Oral Given 50/0/37 0488)  folic acid (FOLVITE) tablet 1 mg (1 mg Oral Given 07/04/22 1855)  magnesium gluconate (MAGONATE) tablet 500 mg (500 mg Oral Given 07/04/22 1855)  metoprolol succinate (TOPROL-XL) 24 hr tablet 25 mg (25 mg Oral Given 07/04/22 1855)  mirtazapine (REMERON) tablet 15 mg (15 mg Oral Given 07/04/22 2340)  rosuvastatin (CRESTOR) tablet 5 mg (5 mg Oral Given 07/04/22 1855)  fluticasone furoate-vilanterol (BREO ELLIPTA) 100-25 MCG/ACT 1 puff (has no administration in time range)    And  umeclidinium bromide (INCRUSE ELLIPTA) 62.5 MCG/ACT 1 puff (has no administration in time range)  oxyCODONE (Oxy IR/ROXICODONE) immediate release tablet 10 mg (10 mg Oral Given 07/05/22 8916)  pneumococcal 20-valent conjugate vaccine (PREVNAR 20) injection 0.5 mL (has no  administration in time range)  lidocaine-EPINEPHrine (XYLOCAINE W/EPI) 2 %-1:200000 (PF) injection 10 mL (10 mLs Intradermal Given by Other 07/04/22 1526)  fentaNYL (SUBLIMAZE) injection 100 mcg (100 mcg Intravenous Given 07/04/22 1522)  ondansetron (ZOFRAN) injection 4 mg (4 mg Intravenous Given 07/04/22 1511)    Vitals:   07/04/22 1830 07/04/22 1855 07/04/22 2229 07/05/22 0436  BP: (!) 138/93  (!) 150/98 117/83  Pulse: 90  86 71  Resp: 17  19 15   Temp:  97.6 F (36.4 C) 98.4 F (36.9 C) 97.9 F (36.6 C)  TempSrc:  Oral Oral Oral  SpO2: 96%  96% 97%  Weight:   43 kg 43 kg  Height:   5\' 8"  (1.727 m)     Final diagnoses:  Primary spontaneous pneumothorax    Admission/ observation were discussed with the admitting physician, patient and/or family and they are comfortable with the plan.          Final Clinical Impression(s) / ED Diagnoses Final diagnoses:  Primary spontaneous pneumothorax    Rx / DC Orders ED Discharge Orders     None         Deno Etienne, DO 07/05/22 9450

## 2022-07-04 NOTE — ED Triage Notes (Signed)
EMS stated, pt with SOB  not felt well since yesterday. Has had pneumonia with chest tube. Right side tube remove yesteday has had a bilateral pneumothorax.

## 2022-07-04 NOTE — Plan of Care (Signed)
  Problem: Education: Goal: Knowledge of disease or condition will improve Outcome: Progressing Goal: Knowledge of the prescribed therapeutic regimen will improve Outcome: Progressing   Problem: Activity: Goal: Ability to tolerate increased activity will improve Outcome: Progressing Goal: Will verbalize the importance of balancing activity with adequate rest periods Outcome: Progressing   Problem: Respiratory: Goal: Ability to maintain a clear airway will improve Outcome: Progressing Goal: Levels of oxygenation will improve Outcome: Progressing Goal: Ability to maintain adequate ventilation will improve Outcome: Progressing   Problem: Education: Goal: Knowledge of General Education information will improve Description: Including pain rating scale, medication(s)/side effects and non-pharmacologic comfort measures Outcome: Progressing   Problem: Health Behavior/Discharge Planning: Goal: Ability to manage health-related needs will improve Outcome: Progressing   Problem: Clinical Measurements: Goal: Ability to maintain clinical measurements within normal limits will improve Outcome: Progressing Goal: Respiratory complications will improve Outcome: Progressing Goal: Cardiovascular complication will be avoided Outcome: Progressing

## 2022-07-04 NOTE — H&P (Addendum)
Subjective:   Patient is a 66 y.o. male presents with recurrent  pneumothoracies.  He is well-known to [facility] after a recent admission and previous history of right upper lobectomy.  He has a history of tobacco abuse as well as severe COPD and stage IIIa non-small cell lung cancer. His VATS lobectomy was in 2020.  He had a prolonged postoperative airleak.  On 06/01/2022 he had a routine follow-up CT scan which showed a loculated right pneumothorax and pneumomediastinum.  He was admitted at that time and a pigtail catheter was placed.  He was discharged with a mini express.  On 06/25/2022 he represented following an episode of pain along the left clavicle and shoulder similar to pain he had experienced prior to being diagnosed with the left pneumothorax.  A chest tube was placed at that time by interventional radiology.  At the time of discharge he had bilateral mini express chest tubes.  He was seen in the office on 07/01/2022 by [name] and the left chest tube was removed.  He has represented to the ER today with a large recurrent left pneumothorax and will require admission with placement of new chest tube.  He states that not much is changed recently in his overall condition.  He does have some coughing and chronic sinus congestion with occasional sputum production.  He is no longer getting chemotherapy which was stopped approximately a year ago due to severe anemia and malnourishment/wasting.  He states his most recent scans show his cancer to be stable.      Patient Active Problem List   Diagnosis Date Noted   Pneumothorax 06/25/2022   Spontaneous pneumothorax 06/06/2022   CKD (chronic kidney disease) stage 3, GFR 30-59 ml/min (HCC) 03/14/2022   Antineoplastic chemotherapy induced anemia 08/01/2021   Intractable nausea and vomiting 03/01/2021   Generalized abdominal pain 03/01/2021   Elevated lactic acid level 03/01/2021   Port-A-Cath in place 11/21/2020   Adenocarcinoma of right lung,  stage 4 ([place]) 10/30/2020   Encounter for antineoplastic immunotherapy 10/30/2020   Bone metastasis 10/01/2020   Abdominal pain 12/07/2018   Appendicitis 12/07/2018   Goals of care, counseling/discussion 10/13/2018   Encounter for antineoplastic chemotherapy 10/13/2018   S/P Right VATS with Right Upper Lobectomy, Lymph Node Sampling, Intercostal Nerve Block 09/14/2018   Pneumothorax after biopsy 09/02/2018   Stage 1 mild COPD by GOLD classification ([place]) 07/30/2018   Syncope 07/23/2018   Antiplatelet or antithrombotic long-term use 07/14/2018   Nodule of upper lobe of right lung 07/14/2018   Multiple lung nodules on CT 07/14/2018   Nausea vomiting and diarrhea 10/30/2016   Hyponatremia 10/30/2016   Hypocalcemia 10/30/2016   Hypokalemia 04/14/2015   Hypomagnesemia 04/14/2015   Claudication ([place]) 04/13/2015   Essential hypertension 01/14/2014   Hyperlipidemia 01/14/2014   Thrombosed external hemorrhoids 12/07/2013   Chronic anticoagulation 01/20/2013   PVD (peripheral vascular disease) ([place]) 01/20/2013   Coronary atherosclerosis of native coronary artery 01/20/2013   GERD (gastroesophageal reflux disease) 01/20/2013   Benign esophageal stricture 01/20/2013   DENTAL CARIES 07/20/2010   Past Medical History:  Diagnosis Date   Allergy    Anemia    Anginal pain ([place])    Blood transfusion without reported diagnosis    CAD (coronary artery disease)    CAP (community acquired pneumonia) 09/2016   COPD (chronic obstructive pulmonary disease) (HCC)    Emphysema of lung (HCC)    GERD (gastroesophageal reflux disease)    Heart murmur    "I was told I  had one when I was a kid"   Hemorrhoids    History of anal fissures    "no surgeries" (10/30/2016)   Hyperlipidemia    Hypertension    lung ca dx'd 08/2018   with mets to bones in arms   Myocardial infarction [facility])    Peripheral arterial disease ([place])    status post right common iliac artery stenting back in 2007   Seasonal allergies     Tobacco abuse     Past Surgical History:  Procedure Laterality Date   ANKLE SURGERY Left    "rebuilt it"   ANTERIOR CRUCIATE LIGAMENT REPAIR Right    CARDIAC CATHETERIZATION  09/23/2008   Continued medical therapy - may need GI evaluation in addition.   CARDIAC CATHETERIZATION  10/28/2007   Medical therapy recommended.   CARDIAC CATHETERIZATION  11/18/2006   In-stent restenosis RCA  (50% distal edge, 80% segmental mid, and 50-60% segmental proximal). Successful cutting balloon atherectomy using a [id number] cutting balloon. 3 inflations with atherectomy performed on mid and proximal portions resulting in reduction of 80% mid in-stent restenosis to less than 20% residual and 50-60% segmental proximal to less than 20% residual without dissection.   CARDIAC CATHETERIZATION  02/26/2006   Severe stenosis in RCA. Stenting performed using IVUS. 3.5x20 Maverick balloon deployed at [employer]. Distal stent-a 4x28 Liberte stent-deployed 12atm 48sec, 12atm 31sec, 4atm 19sec. Mid stent-a 4x28 Liberte stent-deployed 14atm 45sec, 14atm 60sec, 14atm 44sec. Proximal stent-4x8 Liberte- 14atm 45sec,14atm 47sec, 16atm 43sec. Severely diseased segment then appeared TIMI-3 flow.   CARDIOVASCULAR STRESS TEST  11/17/2012   No significant ECG changes. Septal perfusion defect is new when complared to study from 2010. Abnormal myocardial perfusion imaging with a basal to mid perfusion suggestive of previous MI.   CAROTID DOPPLER  08/09/2011   Bilateral Bulb/Proximal ICA - demonstrated a mild amount of fibrous plaque without evidence of significant diameter reduction reduction or other vascular abnormality.   CHEST TUBE INSERTION Right 09/02/2018   Procedure: Chest Tube Insertion;  Surgeon: Garner Nash, DO;  Location: [place];  Service: Thoracic;  Laterality: Right;   COLONOSCOPY     2003, 2014   CORONARY ANGIOPLASTY WITH STENT PLACEMENT     FEMORAL ARTERY STENT     INGUINAL HERNIA REPAIR Right    IR IMAGING GUIDED PORT  INSERTION  11/13/2020   KNEE ARTHROSCOPY Right "multiple"   LAPAROSCOPIC APPENDECTOMY N/A 12/07/2018   Procedure: APPENDECTOMY LAPAROSCOPIC;  Surgeon: Coralie Keens, MD;  Location: [facility];  Service: General;  Laterality: N/A;   LOWER EXTREMITY ARTERIAL DOPPLER  01/31/2011   Bilateral ABIs-normal values with no suggestion of arterial insuff to the lower extremities at rest. Right CIA stent-mild amount of nonhemodynamically significant plaque is noted throughout   [place]   "bone-eating tumor"   PERCUTANEOUS STENT INTERVENTION  04/04/2006 & 04/13/2015   a. Right common iliac artery with an 8.0x18 mm Herculink stent deployed at 12 atm. Stenosis was reduced from 80% to 0% with brisk flow. b. I-cast stenting to left common iliac artery   PERIPHERAL VASCULAR CATHETERIZATION N/A 04/13/2015   Procedure: Lower Extremity Angiography;  Surgeon: Lorretta Harp, MD; L-oCIA 75%, 40-50% L-EIA, R-CIA stent patent, s/p 8 mm x 38 mm ICast covered stent>>0% stenosis in [place] Right    TRANSTHORACIC ECHOCARDIOGRAM  11/26/2012   EF not noted. Aortic valve-sclerosis without stenosis, no regurgiation.    UPPER GASTROINTESTINAL ENDOSCOPY  US CAROTID DOPPLER BILATERAL ([place] HX)  08/09/2011   Bilateral Bulb/Proximal ICAa demonstrated a mild amount of fibrous plaque without evidence of significant diameter reduction or any other vascular abnormality.   VIDEO ASSISTED THORACOSCOPY (VATS)/ LOBECTOMY Right 09/14/2018   Procedure: RIGHT VIDEO ASSISTED THORACOSCOPY (VATS)/ RIGHT UPPER LOBECTOMY;  Surgeon: Melrose Nakayama, MD;  Location: [facility];  Service: Thoracic;  Laterality: Right;   VIDEO BRONCHOSCOPY WITH ENDOBRONCHIAL NAVIGATION N/A 09/02/2018   Procedure: VIDEO BRONCHOSCOPY WITH ENDOBRONCHIAL NAVIGATION;  Surgeon: Garner Nash, DO;  Location: [place];  Service: Thoracic;  Laterality: N/A;    (Not in a hospital admission)  Allergies  Allergen  Reactions   Compazine [Prochlorperazine] Other (See Comments)    " made him high and could not sleep". He does not want to take it again.    Social History   Tobacco Use   Smoking status: Former    Packs/day: 0.25    Years: 38.00    Total pack years: 9.50    Types: Cigarettes    Quit date: 10/19/2018    Years since quitting: 3.7   Smokeless tobacco: Never   Tobacco comments:    pack per day 12.17.19  Substance Use Topics   Alcohol use: Not Currently    Comment: socially    Family History  Problem Relation Age of Onset   Colon cancer Mother    Heart disease Father    Heart disease Paternal Grandfather    Rectal cancer Other    Esophageal cancer Neg Hx    Stomach cancer Neg Hx      Current Outpatient Medications  Medication Instructions   acetaminophen (TYLENOL) 1,000 mg, Oral, Every 6 hours PRN   amLODipine (NORVASC) 5 mg, Oral, Daily   aspirin EC 81 mg, Oral, Daily   clopidogrel (PLAVIX) 75 mg, Oral, Daily   esomeprazole (NEXIUM) 40 mg, Oral, Daily   fexofenadine (ALLEGRA) 60 mg, Oral, Every evening   Fluticasone-Umeclidin-Vilant (TRELEGY ELLIPTA) 100-62.5-25 MCG/ACT AEPB 1 puff, Inhalation, Daily   folic acid (FOLVITE) 1 mg, Oral, Daily   Magnesium 500 mg, Oral, 2 times daily   metoprolol succinate (TOPROL-XL) 25 mg, Oral, Daily   mirtazapine (REMERON) 15 MG tablet TAKE 1 TABLET(15 MG) BY MOUTH AT BEDTIME   Multiple Vitamin (MULTIVITAMIN WITH MINERALS) TABS tablet 1 tablet, Oral, Daily   oxyCODONE (OXY IR/ROXICODONE) 5 mg, Oral, Every 4 hours PRN   rosuvastatin (CRESTOR) 5 MG tablet TAKE 1 TABLET(5 MG) BY MOUTH EVERY OTHER DAY     Review of Systems Pertinent items are noted in HPI. He did have a recent headache and describes chronic neck pain.   Objective:   Patient Vitals for the past 8 hrs:  BP Temp Temp src Pulse Resp SpO2 Height Weight  07/04/22 1424 (!) 131/109 -- -- 85 15 95 % -- --  07/04/22 1421 -- -- -- 85 (!) 23 96 % -- --  07/04/22 1417 -- -- --  84 15 92 % -- --  07/04/22 1414 -- 98 F (36.7 C) Oral -- -- -- -- --  07/04/22 1331 (!) 130/96 98 F (36.7 C) Oral 81 17 97 % -- --  07/04/22 1205 (!) 137/105 98.3 F (36.8 C) Oral 80 16 97 % -- --  07/04/22 1041 -- -- -- -- -- -- _0  (1.727 m) 45.4 kg  07/04/22 0951 (!) 136/94 97.7 F (36.5 C) Oral 75 18 95 % -- --   No intake/output data recorded. No intake/output data recorded.  DG Chest 2 View  Result Date: 07/04/2022 CLINICAL DATA:  Chest pain EXAM: CHEST - 2 VIEW COMPARISON:  Previous studies including the examination of 07/01/2022 FINDINGS: There is interval removal of left chest tube. There is interval appearance of large left pneumothorax with partial atelectasis in left lung. Right chest tube is noted with its tip in the medial right upper lung field. Small right pleural effusion is seen. There is no pneumothorax on the right side. Right IJ chest port is seen with its tip in superior vena cava. Degenerative changes are noted in left shoulder. IMPRESSION: There is a large left pneumothorax with partial atelectasis in left lung. Small right pleural effusion. Imaging findings were relayed to patient's provider Soijett Blue by telephone call. Electronically Signed   By: Elmer Picker M.D.   On: 07/04/2022 11:41     General appearance: alert, cooperative, cachectic, and no distress Head: Normocephalic, without obvious abnormality, atraumatic Eyes: Conjunctiva normal, no scleral icterus, PERRL, EOMs intact Throat: He does have a white tongue exudate Neck: no carotid bruit, no JVD, supple, symmetrical, trachea midline, and thyroid not enlarged, symmetric, no tenderness/mass/nodules Back: Scoliosis Lungs: Upper airway and expiratory wheeze, diminished breath sounds in the lower fields Chest wall: no tenderness Heart: regular rate and rhythm Abdomen: soft, non-tender; bowel sounds normal; no masses,  no organomegaly Extremities: extremities normal, atraumatic, no cyanosis or  edema Skin: No rashes or lesions Lymph nodes: Palpable left subclavicular lymph node Neurologic: Mental status: Alert, oriented, thought content appropriate .  No results found for this or any previous visit (from the past 48 hour(s)).   Assessment:   Recurrent left pneumothorax-chest tube to be placed.  He would be a poor surgical candidate unfortunately due to severe COPD and metastatic lung cancer.  He could potentially be a candidate for EBV's.  Plan:   Chest tube placement Admission for further observation/potential future interventions depending on course

## 2022-07-04 NOTE — ED Notes (Signed)
Cardiothoracic PA at bedside. Notified Floyd MD that pt's port as been accessed and pt is ready for chest tube

## 2022-07-04 NOTE — ED Notes (Signed)
IV attempt x 1 unsuccessful; labs obtained

## 2022-07-05 ENCOUNTER — Inpatient Hospital Stay (HOSPITAL_COMMUNITY): Payer: BC Managed Care – PPO

## 2022-07-05 LAB — MRSA NEXT GEN BY PCR, NASAL: MRSA by PCR Next Gen: NOT DETECTED

## 2022-07-05 MED ORDER — TALC (STERITALC) POWDER FOR INTRAPLEURAL USE
4.0000 g | Freq: Once | INTRAPLEURAL | Status: AC
  Administered 2022-07-05: 4 g via INTRAPLEURAL
  Filled 2022-07-05: qty 4

## 2022-07-05 NOTE — Hospital Course (Signed)
History of Present Illness:  Patient is a 66 y.o. male presents with recurrent pneumothoracies.  He is well-known to [facility] after a recent admission and previous history of right upper lobectomy.  He has a history of tobacco abuse as well as severe COPD and stage IIIa non-small cell lung cancer. His VATS lobectomy was in 2020.  He had a prolonged postoperative airleak.  On 06/01/2022 he had a routine follow-up CT scan which showed a loculated right pneumothorax and pneumomediastinum.  He was admitted at that time and a pigtail catheter was placed.  He was discharged with a mini express.  On 06/25/2022 he represented following an episode of pain along the left clavicle and shoulder similar to pain he had experienced prior to being diagnosed with the left pneumothorax.  A chest tube was placed at that time by interventional radiology.  At the time of discharge he had bilateral mini express chest tubes.  He was seen in the office on 07/01/2022 by [name] and the left chest tube was removed.  He presented to the ER on 07/04/2022 with a large recurrent left pneumothorax and will require admission with placement of new chest tube.    Hospital Course:  Patients CXR showed re-expansion of left lung.  He did have a small air leak on the left side.  His right sided chest tube remained present with a stable apical pneumothorax.  He is not a surgical candidate.  It was recommended he undergo Talc pleurodesis to attempt and prevent further pneumothoraces on the left side.  He was agreeable to this and underwent installation of a 4g Talc slurry on 12/8.  Follow up CXR showed increase in left sided pneumothorax.  His right pneumothorax remained stable with CT to Mini Express.

## 2022-07-05 NOTE — Discharge Summary (Signed)
Physician Discharge Summary  Patient ID: Sean Rose MRN: [id number] DOB/AGE: July 03, 1956 66 y.o.  Admit date: 07/04/2022 Discharge date: 07/11/2022  Admission Diagnoses:  Patient Active Problem List   Diagnosis Date Noted   Protein-calorie malnutrition, severe 07/06/2022   Pneumothorax 06/25/2022   Spontaneous pneumothorax 06/06/2022   CKD (chronic kidney disease) stage 3, GFR 30-59 ml/min (HCC) 03/14/2022   Antineoplastic chemotherapy induced anemia 08/01/2021   Intractable nausea and vomiting 03/01/2021   Generalized abdominal pain 03/01/2021   Elevated lactic acid level 03/01/2021   Port-A-Cath in place 11/21/2020   Adenocarcinoma of right lung, stage 4 ([place]) 10/30/2020   Encounter for antineoplastic immunotherapy 10/30/2020   Bone metastasis 10/01/2020   Abdominal pain 12/07/2018   Appendicitis 12/07/2018   Goals of care, counseling/discussion 10/13/2018   Encounter for antineoplastic chemotherapy 10/13/2018   S/P Right VATS with Right Upper Lobectomy, Lymph Node Sampling, Intercostal Nerve Block 09/14/2018   Pneumothorax after biopsy 09/02/2018   Stage 1 mild COPD by GOLD classification ([place]) 07/30/2018   Syncope 07/23/2018   Antiplatelet or antithrombotic long-term use 07/14/2018   Nodule of upper lobe of right lung 07/14/2018   Multiple lung nodules on CT 07/14/2018   Nausea vomiting and diarrhea 10/30/2016   Hyponatremia 10/30/2016   Hypocalcemia 10/30/2016   Hypokalemia 04/14/2015   Hypomagnesemia 04/14/2015   Claudication ([place]) 04/13/2015   Essential hypertension 01/14/2014   Hyperlipidemia 01/14/2014   Thrombosed external hemorrhoids 12/07/2013   Chronic anticoagulation 01/20/2013   PVD (peripheral vascular disease) ([place]) 01/20/2013   Coronary atherosclerosis of native coronary artery 01/20/2013   GERD (gastroesophageal reflux disease) 01/20/2013   Benign esophageal stricture 01/20/2013   DENTAL CARIES 07/20/2010   Discharge Diagnoses:   Patient Active  Problem List   Diagnosis Date Noted   Protein-calorie malnutrition, severe 07/06/2022   Pneumothorax 06/25/2022   Spontaneous pneumothorax 06/06/2022   CKD (chronic kidney disease) stage 3, GFR 30-59 ml/min (HCC) 03/14/2022   Antineoplastic chemotherapy induced anemia 08/01/2021   Intractable nausea and vomiting 03/01/2021   Generalized abdominal pain 03/01/2021   Elevated lactic acid level 03/01/2021   Port-A-Cath in place 11/21/2020   Adenocarcinoma of right lung, stage 4 ([place]) 10/30/2020   Encounter for antineoplastic immunotherapy 10/30/2020   Bone metastasis 10/01/2020   Abdominal pain 12/07/2018   Appendicitis 12/07/2018   Goals of care, counseling/discussion 10/13/2018   Encounter for antineoplastic chemotherapy 10/13/2018   S/P Right VATS with Right Upper Lobectomy, Lymph Node Sampling, Intercostal Nerve Block 09/14/2018   Pneumothorax after biopsy 09/02/2018   Stage 1 mild COPD by GOLD classification ([place]) 07/30/2018   Syncope 07/23/2018   Antiplatelet or antithrombotic long-term use 07/14/2018   Nodule of upper lobe of right lung 07/14/2018   Multiple lung nodules on CT 07/14/2018   Nausea vomiting and diarrhea 10/30/2016   Hyponatremia 10/30/2016   Hypocalcemia 10/30/2016   Hypokalemia 04/14/2015   Hypomagnesemia 04/14/2015   Claudication ([place]) 04/13/2015   Essential hypertension 01/14/2014   Hyperlipidemia 01/14/2014   Thrombosed external hemorrhoids 12/07/2013   Chronic anticoagulation 01/20/2013   PVD (peripheral vascular disease) ([place]) 01/20/2013   Coronary atherosclerosis of native coronary artery 01/20/2013   GERD (gastroesophageal reflux disease) 01/20/2013   Benign esophageal stricture 01/20/2013   DENTAL CARIES 07/20/2010   Discharged Condition: good  History of Present Illness:  Patient is a 66 y.o. male presents with recurrent pneumothoracies.  He is well-known to [facility] after a recent admission and previous history of right upper lobectomy.  He has a  history of  tobacco abuse as well as severe COPD and stage IIIa non-small cell lung cancer. His VATS lobectomy was in 2020.  He had a prolonged postoperative airleak.  On 06/01/2022 he had a routine follow-up CT scan which showed a loculated right pneumothorax and pneumomediastinum.  He was admitted at that time and a pigtail catheter was placed.  He was discharged with a mini express.  On 06/25/2022 he represented following an episode of pain along the left clavicle and shoulder similar to pain he had experienced prior to being diagnosed with the left pneumothorax.  A chest tube was placed at that time by interventional radiology.  At the time of discharge he had bilateral mini express chest tubes.  He was seen in the office on 07/01/2022 by [name] and the left chest tube was removed.  He presented to the ER on 07/04/2022 with a large recurrent left pneumothorax and will require admission with placement of new chest tube.    Hospital Course:  Patients CXR showed re-expansion of left lung.  He did have a small air leak on the left side.  His right sided chest tube remained present with a stable apical pneumothorax.  He is not a surgical candidate.  It was recommended he undergo Talc pleurodesis to attempt and prevent further pneumothoraces on the left side.  He was agreeable to this and underwent installation of a 4g Talc slurry on 12/8.  Follow up CXR showed increase in left sided pneumothorax.  His right pneumothorax remained stable with CT to Mini Express.  The patient's left sided pneumothorax improved.  He developed vomiting not responsive to anti-emetics, and his regimen was further adjusted.  The patient's pneumothorax improved but did not completely resolve.  He was placed to water seal on 07/08/2022.  His nausea persisted with poor oral intake.  His IV fluids were increased.  His creatinine level increased to 1.97 likely due to dehydration from N/V.  IV fluids were increased to 100 ml/hr.  Repeat  level improved to 1.67 and his nausea and vomiting improved.  He developed urinary retention due to Scopalamine patch.  This was discontinued.  He required I/O catheterization.  He was able to void without difficulty.  His right sided chest tube remains in place.  There is no evidence of pneumothorax.  There is a persistent air leak.  He will be taken to the OR on 07/10/2022 for placement of IBV valves.  The patient's left sided chest tube was removed while in the OR.  Follow up CXR shows stable appearance of left sided pneumothorax.  His Right pneumothorax remains stable without evidence of air leak, however there is audible noise in chest tube tubing.  He will be placed to a Mini Express with plan to remove tube in a few weeks.  He is at his baseline state.  He is medically stable for discharge home today.  Consults: None  Treatments: surgery:    NAME: Matney, Marquest W. MEDICAL RECORD NO: [id number] ACCOUNT NO: [id number] DATE OF BIRTH: August 21, 1955 FACILITY: [facility] LOCATION: [facility] PHYSICIAN: Revonda Standard. Roxan Hockey, MD   Operative Report    DATE OF PROCEDURE: 07/10/2022   PREOPERATIVE DIAGNOSIS:  Resolved air leak on left and persistent air leak on right.   POSTOPERATIVE DIAGNOSIS:  Resolved air leak on left and persistent air leak on right.   PROCEDURE:  Removal of left pleural catheter.  Bronchoscopy with endobronchial valve placement x2 (9 mm valves in medial and lateral segmental bronchi of right middle lobe).  SURGEON:  Revonda Standard. Roxan Hockey, MD      Chest Tube Pleurodesis Note   KANAN SOBEK  [id number]  10-16-1955   Date:07/05/22  Time:9:56 AM    Provider Performing:Myrta Mercer    Procedure: Chemical Pleurodesis via Chest Tube (32560)  Indication(s) Induced scarring of pleural space  Consent Risks of the procedure as well as the alternatives and risks of each were explained to the patient and/or caregiver.  Consent for the procedure was obtained.   Procedure  Description Existing pleural catheter was cleaned and accessed in sterile manner.  Talc 4g in 50cc saline slurry administered into pleural space.  The chest tube will be clamped and patient will rotate positions for 1 hour then chest tube will be placed to suction.   Complications/Tolerance None; patient tolerated the procedure well.   EBL None   Specimen(s) None     Nazarene Bunning, PA-C      Discharge Exam: Blood pressure 109/78, pulse 71, temperature 98 F (36.7 C), temperature source Oral, resp. rate 20, height 5\' 8"  (1.727 m), weight 42.2 kg, SpO2 95 %.  General appearance: alert, cooperative, and no distress Heart: regular rate and rhythm Lungs: clear to auscultation bilaterally Abdomen: soft, non-tender; bowel sounds normal; no masses,  no organomegaly Extremities: extremities normal, atraumatic, no cyanosis or edema Wound: clean and dry   Discharge disposition: 01-Home or Self Care   Allergies as of 07/11/2022       Reactions   Compazine [prochlorperazine] Other (See Comments)   " made him high and could not sleep". He does not want to take it again.        Medication List     TAKE these medications    acetaminophen 500 MG tablet Commonly known as: TYLENOL Take 1,000 mg by mouth every 6 (six) hours as needed for headache.   amLODipine 5 MG tablet Commonly known as: NORVASC TAKE 1 TABLET BY MOUTH DAILY   aspirin EC 81 MG tablet Take 81 mg by mouth daily.   clopidogrel 75 MG tablet Commonly known as: PLAVIX Take 1 tablet (75 mg total) by mouth daily.   esomeprazole 20 MG capsule Commonly known as: NEXIUM Take 40 mg by mouth daily.   fexofenadine 60 MG tablet Commonly known as: ALLEGRA Take 60 mg by mouth every evening.   folic acid 1 MG tablet Commonly known as: FOLVITE Take 1 tablet (1 mg total) by mouth daily.   Magnesium 500 MG Tabs Take 1 tablet (500 mg total) by mouth in the morning and at bedtime.   metoprolol succinate 25 MG 24  hr tablet Commonly known as: TOPROL-XL TAKE 1 TABLET BY MOUTH DAILY   mirtazapine 15 MG tablet Commonly known as: REMERON TAKE 1 TABLET(15 MG) BY MOUTH AT BEDTIME What changed: See the new instructions.   multivitamin with minerals Tabs tablet Take 1 tablet by mouth daily.   oxyCODONE 5 MG immediate release tablet Commonly known as: Oxy IR/ROXICODONE Take 1 tablet (5 mg total) by mouth every 4 (four) hours as needed for moderate pain or severe pain. What changed: Another medication with the same name was added. Make sure you understand how and when to take each.   oxyCODONE 5 MG immediate release tablet Commonly known as: Oxy IR/ROXICODONE Take 1 tablet (5 mg total) by mouth every 4 (four) hours as needed for moderate pain. What changed: You were already taking a medication with the same name, and this prescription was added. Make sure you understand how and  when to take each.   rosuvastatin 5 MG tablet Commonly known as: CRESTOR TAKE 1 TABLET(5 MG) BY MOUTH EVERY OTHER DAY What changed: See the new instructions.   Trelegy Ellipta 100-62.5-25 MCG/ACT Aepb Generic drug: Fluticasone-Umeclidin-Vilant Inhale 1 puff into the lungs daily.        Follow-up Information     Melrose Nakayama, MD Follow up on 07/25/2022.   Specialty: Cardiothoracic Surgery Why: Appointment is at 9:00 Contact information: [address] [place] [address] [place] Follow up on 07/24/2022.   Why: Please get a CXR at your convenience Contact information: [place]                Signed:  Ellwood Handler, PA-C  07/11/2022, 9:50 AM

## 2022-07-05 NOTE — Progress Notes (Signed)
Mobility Specialist Progress Note    07/05/22 1440  Mobility  Activity Refused mobility   Pt stated "It's not worth it. I am in too much pain and I haven't slept in two days." Will f/u as schedule permits.   Hildred Alamin Mobility Specialist  Please Psychologist, sport and exercise or Rehab Office at [phone]

## 2022-07-05 NOTE — Plan of Care (Signed)

## 2022-07-05 NOTE — Progress Notes (Signed)
      [address]       [place] [address]             [phone]       Subjective:   Patient w/o specific complaints.  Feels like his lung went down overnight.  He is agreeable to talc pleurodes  Objective: Vital signs in last 24 hours: Temp:  [97.6 F (36.4 C)-98.4 F (36.9 C)] 97.9 F (36.6 C) (12/08 0436) Pulse Rate:  [71-93] 71 (12/08 0436) Cardiac Rhythm: Normal sinus rhythm (12/08 0724) Resp:  [13-26] 15 (12/08 0436) BP: (117-150)/(83-109) 117/83 (12/08 0436) SpO2:  [92 %-99 %] 97 % (12/08 0436) Weight:  [43 kg-45.4 kg] 43 kg (12/08 0436)  Intake/Output from previous day: 12/07 0701 - 12/08 0700 In: 240 [P.O.:240] Out: 420 [Urine:150; Chest Tube:270]  General appearance: alert, cooperative, and no distress Heart: regular rate and rhythm Lungs: clear to auscultation bilaterally Abdomen: soft, non-tender; bowel sounds normal; no masses,  no organomegaly Extremities: extremities normal, atraumatic, no cyanosis or edema Wound: clean and dry  Lab Results: Recent Labs    07/04/22 1432  WBC 14.1*  HGB 11.2*  HCT 33.5*  PLT 665*   BMET:  Recent Labs    07/04/22 1432  NA 130*  K 3.2*  CL 95*  CO2 23  GLUCOSE 97  BUN 17  CREATININE 1.71*  CALCIUM 9.5    PT/INR: No results for input(s): "LABPROT", "INR" in the last 72 hours. ABG    Component Value Date/Time   PHART 7.474 (H) 09/15/2018 0750   HCO3 25.0 09/15/2018 0750   TCO2 20 (L) 09/14/2018 1547   ACIDBASEDEF 6.0 (H) 09/14/2018 1547   O2SAT 94.1 09/15/2018 0750   CBG (last 3)  No results for input(s): "GLUCAP" in the last 72 hours.  Assessment/Plan:  Recurrent Spontaneous Pneumothorax on left- not a surgical candidate.  CT in place with small air leak, CXR with re-expansion on left-- will do bedside talc pleurodesis today Right Pneumothorax- apical space on right, pigtail in place on water seal Metastatic Lung Cancer- stable   LOS: 1 day    Ellwood Handler,  PA-C 07/05/2022

## 2022-07-05 NOTE — Progress Notes (Signed)
Initial Nutrition Assessment  DOCUMENTATION CODES:   Severe malnutrition in context of chronic illness  INTERVENTION:  - No interventions at this time. Pt not interested in any nutritional interventions moving forward either.   - RD team will sign off.   NUTRITION DIAGNOSIS:   Severe Malnutrition related to chronic illness as evidenced by severe fat depletion, severe muscle depletion.  GOAL:   Patient will meet greater than or equal to 90% of their needs  MONITOR:   PO intake  REASON FOR ASSESSMENT:   Malnutrition Screening Tool    ASSESSMENT:   66 y.o. male admits related to multiple pneumothoraces. PMH reviewed: CAD, CAP, COPD, GERD, HLD, HTN, PAD. Pt is currently receiving medical management related to spontaneous pneumothorax.  Meds reviewed: colace, folic acid, magonate, remeron, MVI. Labs reviewed: Na low, K low.   Pt not interested in nutrition evaluation. Pt states he hates everything about this hospital and hates the food here. Pt states that he would rather miss meals and not eat than eat the food here at the hospital. Pt states that his wife was bringing him food from home. Pt states that this encounter was pointless so it was difficult for RD to gather nutrition hx details. Pt not interested in Ensures that "are 3x the price that he would pay himself."   Pt is visibly severely malnourished.  NUTRITION - FOCUSED PHYSICAL EXAM:  Flowsheet Row Most Recent Value  Orbital Region Severe depletion  Upper Arm Region Severe depletion  Thoracic and Lumbar Region Unable to assess  Buccal Region Severe depletion  Temple Region Severe depletion  Clavicle Bone Region Severe depletion  Clavicle and Acromion Bone Region Severe depletion  Scapular Bone Region Unable to assess  Dorsal Hand Severe depletion  Patellar Region Unable to assess  Anterior Thigh Region Unable to assess  Posterior Calf Region Unable to assess  Edema (RD Assessment) None  Hair Unable to assess   Eyes Unable to assess  Mouth Unable to assess  Skin Unable to assess  Nails Unable to assess      Diet Order:   Diet Order             Diet regular Room service appropriate? Yes; Fluid consistency: Thin  Diet effective now                   EDUCATION NEEDS:   Not appropriate for education at this time  Skin:  Skin Assessment: Reviewed RN Assessment  Last BM:  unknown  Height:   Ht Readings from Last 1 Encounters:  07/04/22 5\' 8"  (1.727 m)    Weight:   Wt Readings from Last 1 Encounters:  07/05/22 43 kg    Ideal Body Weight:     BMI:  Body mass index is 14.41 kg/m.  Estimated Nutritional Needs:   Kcal:  1290-1505 gm  Protein:  65-75 gm  Fluid:  >/= 1.2 L  Thalia Bloodgood, RD, LDN, CNSC.

## 2022-07-05 NOTE — Procedures (Cosign Needed)
Chest Tube Pleurodesis Note  MAKO PELFREY  [id number]  Jan 10, 1956  Date:07/05/22  Time:9:56 AM   Provider Performing:Amaya Blakeman   Procedure: Chemical Pleurodesis via Chest Tube (32560)  Indication(s) Induced scarring of pleural space  Consent Risks of the procedure as well as the alternatives and risks of each were explained to the patient and/or caregiver.  Consent for the procedure was obtained.   Procedure Description Existing pleural catheter was cleaned and accessed in sterile manner.  Talc 4g in 50cc saline slurry administered into pleural space.  The chest tube will be clamped and patient will rotate positions for 1 hour then chest tube will be placed to suction.   Complications/Tolerance None; patient tolerated the procedure well.   EBL None   Specimen(s) None   Laticha Ferrucci, PA-C

## 2022-07-06 ENCOUNTER — Inpatient Hospital Stay (HOSPITAL_COMMUNITY): Payer: BC Managed Care – PPO

## 2022-07-06 DIAGNOSIS — E43 Unspecified severe protein-calorie malnutrition: Secondary | ICD-10-CM | POA: Insufficient documentation

## 2022-07-06 DIAGNOSIS — J939 Pneumothorax, unspecified: Secondary | ICD-10-CM | POA: Diagnosis not present

## 2022-07-06 LAB — BASIC METABOLIC PANEL
Anion gap: 8 (ref 5–15)
BUN: 12 mg/dL (ref 8–23)
CO2: 25 mmol/L (ref 22–32)
Calcium: 8.8 mg/dL — ABNORMAL LOW (ref 8.9–10.3)
Chloride: 95 mmol/L — ABNORMAL LOW (ref 98–111)
Creatinine, Ser: 1.58 mg/dL — ABNORMAL HIGH (ref 0.61–1.24)
GFR, Estimated: 48 mL/min — ABNORMAL LOW (ref >60)
Glucose, Bld: 107 mg/dL — ABNORMAL HIGH (ref 70–99)
Potassium: 3.5 mmol/L (ref 3.5–5.1)
Sodium: 128 mmol/L — ABNORMAL LOW (ref 135–145)

## 2022-07-06 MED ORDER — CHLORPROMAZINE HCL 10 MG PO TABS
10.0000 mg | ORAL_TABLET | Freq: Three times a day (TID) | ORAL | Status: DC | PRN
  Administered 2022-07-06 – 2022-07-07 (×2): 10 mg via ORAL
  Filled 2022-07-06 (×4): qty 1

## 2022-07-06 NOTE — Progress Notes (Addendum)
      [address]       [place], [address]             [phone]    Subjective:  Patient had some pain/mild short of breath with ambulation.. Otherwise doing well.Sean Rose He was hoping to go home today.  Objective: Vital signs in last 24 hours: Temp:  [97.7 F (36.5 C)-99.4 F (37.4 C)] 98.8 F (37.1 C) (12/09 0728) Pulse Rate:  [76-91] 83 (12/09 0728) Cardiac Rhythm: Normal sinus rhythm (12/09 0700) Resp:  [12-20] 16 (12/09 0728) BP: (106-133)/(83-99) 106/83 (12/09 0728) SpO2:  [94 %-98 %] 98 % (12/09 0320) Weight:  [44.8 kg] 44.8 kg (12/09 0320)  Intake/Output from previous day: 12/08 0701 - 12/09 0700 In: 240 [P.O.:240] Out: 390 [Urine:330; Chest Tube:60]  General appearance: alert, cooperative, and no distress Heart: regular rate and rhythm Lungs: clear to auscultation bilaterally Wound: clean and dry  Lab Results: Recent Labs    07/04/22 1432  WBC 14.1*  HGB 11.2*  HCT 33.5*  PLT 665*   BMET:  Recent Labs    07/04/22 1432 07/06/22 0330  NA 130* 128*  K 3.2* 3.5  CL 95* 95*  CO2 23 25  GLUCOSE 97 107*  BUN 17 12  CREATININE 1.71* 1.58*  CALCIUM 9.5 8.8*    PT/INR: No results for input(s): "LABPROT", "INR" in the last 72 hours. ABG    Component Value Date/Time   PHART 7.474 (H) 09/15/2018 0750   HCO3 25.0 09/15/2018 0750   TCO2 20 (L) 09/14/2018 1547   ACIDBASEDEF 6.0 (H) 09/14/2018 1547   O2SAT 94.1 09/15/2018 0750   CBG (last 3)  No results for input(s): "GLUCAP" in the last 72 hours.  Assessment/Plan:  Recurrent Left Sided Spontaneous Pneumothorax- underwent bedside Talc Pleurodesis yesterday, CXR today with increase in pneumothorax... tube flushed w/o difficulty, redressed chest tube to ensure no kinking would occur, repeat CXR in AM Right sided pneumothorax, CT in place to Mini Express, persistent air leak present Hiccoughs- persistent at times, will add prn Thorazine Metastatic Lung Cancer- stable Dispo- patient  stable, increase in pneumothorax post pleurodesis.Sean Rose no air leak present, CT flushed and checked for kinks, site redressed.. will repeat CXR in AM.. leave right chest tube to mini express    LOS: 2 days    Ellwood Handler, PA-C 07/06/2022  DR Jordan Pardini ADDENDUM TURNED UP TO NEG 40  GOT MORE FLUID OUT WILL SEE TOMORROW AM XR

## 2022-07-07 ENCOUNTER — Inpatient Hospital Stay (HOSPITAL_COMMUNITY): Payer: BC Managed Care – PPO

## 2022-07-07 MED ORDER — DIPHENHYDRAMINE HCL 50 MG/ML IJ SOLN
25.0000 mg | Freq: Once | INTRAMUSCULAR | Status: AC
  Administered 2022-07-07: 25 mg via INTRAVENOUS
  Filled 2022-07-07: qty 1

## 2022-07-07 MED ORDER — SCOPOLAMINE 1 MG/3DAYS TD PT72
1.0000 | MEDICATED_PATCH | TRANSDERMAL | Status: DC
  Administered 2022-07-07: 1.5 mg via TRANSDERMAL
  Filled 2022-07-07: qty 1

## 2022-07-07 MED ORDER — DIPHENHYDRAMINE HCL 12.5 MG/5ML PO ELIX
25.0000 mg | ORAL_SOLUTION | Freq: Once | ORAL | Status: DC
  Filled 2022-07-07: qty 10

## 2022-07-07 MED ORDER — ALBUMIN HUMAN 5 % IV SOLN
250.0000 mL | Freq: Once | INTRAVENOUS | Status: AC
  Administered 2022-07-07: 12.5 g via INTRAVENOUS
  Filled 2022-07-07: qty 250

## 2022-07-07 MED ORDER — ALUM & MAG HYDROXIDE-SIMETH 200-200-20 MG/5ML PO SUSP: 30.0000 mL | ORAL | Status: DC | PRN

## 2022-07-07 MED ORDER — PROMETHAZINE HCL 25 MG RE SUPP: 25.0000 mg | Freq: Four times a day (QID) | RECTAL | Status: DC | PRN

## 2022-07-07 MED ORDER — PROMETHAZINE HCL 25 MG PO TABS: 25.0000 mg | ORAL_TABLET | Freq: Four times a day (QID) | ORAL | Status: DC | PRN

## 2022-07-07 MED ORDER — SODIUM CHLORIDE 0.9 % IV SOLN
12.5000 mg | Freq: Four times a day (QID) | INTRAVENOUS | Status: DC | PRN
  Administered 2022-07-07: 12.5 mg via INTRAVENOUS
  Filled 2022-07-07: qty 0.5

## 2022-07-07 MED ORDER — SODIUM CHLORIDE 0.9 % IV SOLN
25.0000 mg | Freq: Once | INTRAVENOUS | Status: DC
  Filled 2022-07-07: qty 0.5

## 2022-07-07 MED ORDER — MELATONIN 3 MG PO TABS
3.0000 mg | ORAL_TABLET | Freq: Every day | ORAL | Status: DC
  Administered 2022-07-07 – 2022-07-09 (×3): 3 mg via ORAL
  Filled 2022-07-07 (×3): qty 1

## 2022-07-07 MED ORDER — PANTOPRAZOLE SODIUM 40 MG PO TBEC
80.0000 mg | DELAYED_RELEASE_TABLET | Freq: Every day | ORAL | Status: DC
  Administered 2022-07-08 – 2022-07-11 (×3): 80 mg via ORAL
  Filled 2022-07-07 (×3): qty 2

## 2022-07-07 MED ORDER — SODIUM CHLORIDE 0.9 % IV SOLN: INTRAVENOUS | Status: DC

## 2022-07-07 NOTE — Progress Notes (Signed)
   07/07/22 1000  Mobility  Activity Contraindicated/medical hold   Mobility Specialist Progress Note  Nursing staff asking to hold on ambulation. Will f/u as time permits.  Sean Rose Mobility Specialist  Please contact via SecureChat or Rehab office at [phone]

## 2022-07-07 NOTE — Progress Notes (Incomplete)
      [address]       [place] [address]             [phone]    Subjective:  Patient had a rough day/night.  He developed vomiting most of the evening.  It did not really respond to anti-emetics.  He also feels like his esophagus is tight, which he has had stretched before.  He also had a bad experience with Dr. Tenny Craw.    Objective: Vital signs in last 24 hours: Temp:  [98 F (36.7 C)-98.5 F (36.9 C)] 98 F (36.7 C) (12/10 0749) Pulse Rate:  [80-85] 83 (12/10 0749) Cardiac Rhythm: Other (Comment) (12/09 2000) Resp:  [16-18] 18 (12/10 0400) BP: (108-154)/(75-92) 140/92 (12/10 0749) SpO2:  [93 %-96 %] 96 % (12/10 0859) Weight:  [45.2 kg] 45.2 kg (12/10 0400)  Intake/Output from previous day: 12/09 0701 - 12/10 0700 In: 120 [P.O.:120] Out: 417 [Urine:325; Emesis/NG output:2; Chest Tube:90]  General appearance: alert, cooperative, and no distress Heart: regular rate and rhythm Lungs: clear to auscultation bilaterally Abdomen: soft, non-tender; bowel sounds normal; no masses,  no organomegaly Extremities: extremities normal, atraumatic, no cyanosis or edema Wound: clean and dry  Lab Results: Recent Labs    07/04/22 1432  WBC 14.1*  HGB 11.2*  HCT 33.5*  PLT 665*   BMET:  Recent Labs    07/04/22 1432 07/06/22 0330  NA 130* 128*  K 3.2* 3.5  CL 95* 95*  CO2 23 25  GLUCOSE 97 107*  BUN 17 12  CREATININE 1.71* 1.58*  CALCIUM 9.5 8.8*    PT/INR: No results for input(s): "LABPROT", "INR" in the last 72 hours. ABG    Component Value Date/Time   PHART 7.474 (H) 09/15/2018 0750   HCO3 25.0 09/15/2018 0750   TCO2 20 (L) 09/14/2018 1547   ACIDBASEDEF 6.0 (H) 09/14/2018 1547   O2SAT 94.1 09/15/2018 0750   CBG (last 3)  No results for input(s): "GLUCAP" in the last 72 hours.  Assessment/Plan:  Recurrent Left sided spontaneous pneumothorax, S/P Bedside Pleurodesis, CXR shows improvement of pneumothorax, no air leak... leave chest tube on  suction repeat CXR in AM Right Sided pneumothorax- CXR stable w/o pneumothorax.. CT to Mini Express Hiccoughs persistent at times- he did not notice much difference with Thorazine.. however with his new complaint of feeling like his throat is narrowed, concern there could be allergy.. will d/c, give a dose of Benadryl to see if this helps with throat sensation GI- N/V.... patient unsure cause, states maybe he picked up a bug-- did not respond to zofran, allergic to compazine, added phenergan prn   LOS: 3 days    Ellwood Handler, PA-C 07/07/2022

## 2022-07-08 ENCOUNTER — Inpatient Hospital Stay (HOSPITAL_COMMUNITY): Payer: BC Managed Care – PPO

## 2022-07-08 LAB — CBC
HCT: 24 % — ABNORMAL LOW (ref 39.0–52.0)
Hemoglobin: 8.2 g/dL — ABNORMAL LOW (ref 13.0–17.0)
MCH: 32.4 pg (ref 26.0–34.0)
MCHC: 34.2 g/dL (ref 30.0–36.0)
MCV: 94.9 fL (ref 80.0–100.0)
Platelets: 546 10*3/uL — ABNORMAL HIGH (ref 150–400)
RBC: 2.53 MIL/uL — ABNORMAL LOW (ref 4.22–5.81)
RDW: 13.3 % (ref 11.5–15.5)
WBC: 13.1 10*3/uL — ABNORMAL HIGH (ref 4.0–10.5)
nRBC: 0 % (ref 0.0–0.2)

## 2022-07-08 LAB — BASIC METABOLIC PANEL
Anion gap: 15 (ref 5–15)
BUN: 22 mg/dL (ref 8–23)
CO2: 22 mmol/L (ref 22–32)
Calcium: 8.7 mg/dL — ABNORMAL LOW (ref 8.9–10.3)
Chloride: 91 mmol/L — ABNORMAL LOW (ref 98–111)
Creatinine, Ser: 1.97 mg/dL — ABNORMAL HIGH (ref 0.61–1.24)
GFR, Estimated: 37 mL/min — ABNORMAL LOW (ref >60)
Glucose, Bld: 68 mg/dL — ABNORMAL LOW (ref 70–99)
Potassium: 3.3 mmol/L — ABNORMAL LOW (ref 3.5–5.1)
Sodium: 128 mmol/L — ABNORMAL LOW (ref 135–145)

## 2022-07-08 NOTE — Progress Notes (Signed)
Mobility Specialist Progress Note    07/08/22 1359  Mobility  Activity Refused mobility   Pt stated "I just don't want to do it. It is not worth the pain. I will not be talked or tricked into walking. Walking 50 to 100 feet is negligible to my mobility. I will be leaving as early as tomorrow." Will f/u as schedule permits.   Hildred Alamin Mobility Specialist  Please Psychologist, sport and exercise or Rehab Office at [phone]

## 2022-07-08 NOTE — Progress Notes (Signed)
      [address]       [place] [address]             [phone]    Subjective:  Patient started on fluids last night.  Awaken from sleep.  States he thinks he slept well last night.  He was unable to take his pills last night due to N/V.  He thinks if he attempts to drink he will get sick.  Objective: Vital signs in last 24 hours: Temp:  [97.8 F (36.6 C)-98.8 F (37.1 C)] 97.8 F (36.6 C) (12/11 0325) Pulse Rate:  [74-90] 74 (12/11 0325) Resp:  [17-20] 17 (12/11 0325) BP: (91-140)/(76-96) 118/76 (12/11 0325) SpO2:  [92 %-96 %] 96 % (12/11 0325) Weight:  [42.2 kg] 42.2 kg (12/11 0325)  Intake/Output from previous day: 12/10 0701 - 12/11 0700 In: 199.4 [I.V.:61.8; IV Piggyback:137.6] Out: 70 [Urine:50; Chest Tube:20]  General appearance: alert, cooperative, and no distress Heart: regular rate and rhythm Lungs: clear to auscultation bilaterally Wound: clean and dry  Lab Results: No results for input(s): "WBC", "HGB", "HCT", "PLT" in the last 72 hours. BMET:  Recent Labs    07/06/22 0330  NA 128*  K 3.5  CL 95*  CO2 25  GLUCOSE 107*  BUN 12  CREATININE 1.58*  CALCIUM 8.8*    PT/INR: No results for input(s): "LABPROT", "INR" in the last 72 hours. ABG    Component Value Date/Time   PHART 7.474 (H) 09/15/2018 0750   HCO3 25.0 09/15/2018 0750   TCO2 20 (L) 09/14/2018 1547   ACIDBASEDEF 6.0 (H) 09/14/2018 1547   O2SAT 94.1 09/15/2018 0750   CBG (last 3)  No results for input(s): "GLUCAP" in the last 72 hours.  Assessment/Plan:  Recurrent left sided spontaneous pneumothorax, S/P Talc Pleurodesis (12/8)- CT w/o air leak, CXR with small apical and minimal basilar pneumothorax on suction Right sided Pneumothorax- resolved, CT in place on [facility] with persistent air leak GI-persistent N/V.  Patient has not been able to tolerate oral intake, his nutrition status is poor at baseline, will increase IV fluids to 100 ml/hr.. continue  anti-emetics.. patient will eat/drink as able H/O Esophageal stricture, has been dilated in the past... adjusted PPI to home regimen, prn Maalox for indigestion, can follow up on outpatient basis Dispo- patient with persistent N.. continue antiemetics, increase IV fluids to 100 cc/hr until liquid intake improves, check labs as patient has baseline CKD to ensure creatinine is not significantly increased, CT management per [name]   LOS: 4 days    Ellwood Handler, PA-C 07/08/2022

## 2022-07-08 NOTE — TOC Initial Note (Signed)
Transition of Care (TOC) - Initial/Assessment Note  Patient from home with wife.   Patient does not have home health services for [facility] . He reports wife takes care of him and he has doctor appointments.   He was set up with home health in past and did not have a good experience. He reports his wife is "the best nurse"  NCM will follow for disposition needs.   Patient Details  Name: Sean Rose MRN: [id number] Date of Birth: [birth year]  Transition of Care [facility]) CM/SW Contact:    Marilu Favre, RN Phone Number: 07/08/2022, 11:57 AM  Clinical Narrative:                   Expected Discharge Plan: Home/Self Care Barriers to Discharge: Continued Medical Work up   Patient Goals and CMS Choice Patient states their goals for this hospitalization and ongoing recovery are:: to return to home CMS Medicare.gov Compare Post Acute Care list provided to:: Patient Choice offered to / list presented to : Patient  Expected Discharge Plan and Services Expected Discharge Plan: Home/Self Care   Discharge Planning Services: CM Consult Post Acute Care Choice: [place] arrangements for the past 2 months: Single Family Home                 DME Arranged: N/A         HH Arranged: Patient Refused HH          Prior Living Arrangements/Services Living arrangements for the past 2 months: Single Family Home Lives with:: Spouse Patient language and need for interpreter reviewed:: Yes        Need for Family Participation in Patient Care: Yes (Comment) Care giver support system in place?: Yes (comment)   Criminal Activity/Legal Involvement Pertinent to Current Situation/Hospitalization: No - Comment as needed  Activities of Daily Living Home Assistive Devices/Equipment: Grab bars around toilet, Cane (specify quad or straight), Wheelchair ADL Screening (condition at time of admission) Patient's cognitive ability adequate to safely complete daily activities?: Yes Is  the patient deaf or have difficulty hearing?: No Does the patient have difficulty seeing, even when wearing glasses/contacts?: No Does the patient have difficulty concentrating, remembering, or making decisions?: No Patient able to express need for assistance with ADLs?: Yes Does the patient have difficulty dressing or bathing?: Yes Independently performs ADLs?: No Communication: Independent Dressing (OT): Independent Grooming: Independent Feeding: Independent Bathing: Needs assistance Is this a change from baseline?: Pre-admission baseline Toileting: Independent In/Out Bed: Independent Walks in Home: Independent Does the patient have difficulty walking or climbing stairs?: Yes Weakness of Legs: Both Weakness of Arms/Hands: Both  Permission Sought/Granted   Permission granted to share information with : Yes, Verbal Permission Granted  Share Information with NAME: wife [name]           Emotional Assessment Appearance:: Appears stated age Attitude/Demeanor/Rapport: Engaged Affect (typically observed): Accepting Orientation: : Oriented to Self, Oriented to Place, Oriented to  Time, Oriented to Situation Alcohol / Substance Use: Not Applicable Psych Involvement: No (comment)  Admission diagnosis:  Spontaneous pneumothorax [J93.83] Patient Active Problem List   Diagnosis Date Noted   Protein-calorie malnutrition, severe 07/06/2022   Pneumothorax 06/25/2022   Spontaneous pneumothorax 06/06/2022   CKD (chronic kidney disease) stage 3, GFR 30-59 ml/min (HCC) 03/14/2022   Antineoplastic chemotherapy induced anemia 08/01/2021   Intractable nausea and vomiting 03/01/2021   Generalized abdominal pain 03/01/2021   Elevated lactic acid level 03/01/2021   Port-A-Cath in place 11/21/2020  Adenocarcinoma of right lung, stage 4 ([place]) 10/30/2020   Encounter for antineoplastic immunotherapy 10/30/2020   Bone metastasis 10/01/2020   Abdominal pain 12/07/2018   Appendicitis 12/07/2018    Goals of care, counseling/discussion 10/13/2018   Encounter for antineoplastic chemotherapy 10/13/2018   S/P Right VATS with Right Upper Lobectomy, Lymph Node Sampling, Intercostal Nerve Block 09/14/2018   Pneumothorax after biopsy 09/02/2018   Stage 1 mild COPD by GOLD classification ([place]) 07/30/2018   Syncope 07/23/2018   Antiplatelet or antithrombotic long-term use 07/14/2018   Nodule of upper lobe of right lung 07/14/2018   Multiple lung nodules on CT 07/14/2018   Nausea vomiting and diarrhea 10/30/2016   Hyponatremia 10/30/2016   Hypocalcemia 10/30/2016   Hypokalemia 04/14/2015   Hypomagnesemia 04/14/2015   Claudication ([place]) 04/13/2015   Essential hypertension 01/14/2014   Hyperlipidemia 01/14/2014   Thrombosed external hemorrhoids 12/07/2013   Chronic anticoagulation 01/20/2013   PVD (peripheral vascular disease) ([place]) 01/20/2013   Coronary atherosclerosis of native coronary artery 01/20/2013   GERD (gastroesophageal reflux disease) 01/20/2013   Benign esophageal stricture 01/20/2013   DENTAL CARIES 07/20/2010   PCP:  [name], NP Pharmacy:   [id number] [place], [address] [place] [address] Phone: [phone] Fax: [place], [place] [address] [place] [address] Phone: [phone] Fax: [phone]  [facility] [address] [place] [address] Phone: [phone] Fax: [phone]     Social Determinants of Health (SDOH) Interventions    Readmission Risk Interventions    06/10/2022   11:24 AM 06/10/2022   11:23 AM  Readmission Risk Prevention Plan  Transportation Screening  Complete  PCP or Specialist Appt within 5-7 Days Complete   Home Care Screening  Complete  Medication Review (RN CM)  Complete

## 2022-07-08 NOTE — Progress Notes (Signed)
Patient requesting something for sleep and has concerns of lack of fluid intake.  MD notified and verbal orders give for 3mg  melatonin, 257ml 5% albumin and NS at 40/hr.

## 2022-07-08 NOTE — Plan of Care (Signed)
  Problem: Education: Goal: Knowledge of disease or condition will improve Outcome: Progressing Goal: Knowledge of the prescribed therapeutic regimen will improve Outcome: Progressing Goal: Individualized Educational Video(s) Outcome: Progressing   Problem: Activity: Goal: Ability to tolerate increased activity will improve Outcome: Progressing Goal: Will verbalize the importance of balancing activity with adequate rest periods Outcome: Progressing   Problem: Respiratory: Goal: Ability to maintain a clear airway will improve Outcome: Progressing   Problem: Respiratory: Goal: Ability to maintain a clear airway will improve Outcome: Progressing Goal: Levels of oxygenation will improve Outcome: Progressing Goal: Ability to maintain adequate ventilation will improve Outcome: Progressing   Problem: Education: Goal: Knowledge of General Education information will improve Description: Including pain rating scale, medication(s)/side effects and non-pharmacologic comfort measures Outcome: Progressing   Problem: Health Behavior/Discharge Planning: Goal: Ability to manage health-related needs will improve Outcome: Progressing   Problem: Clinical Measurements: Goal: Ability to maintain clinical measurements within normal limits will improve Outcome: Progressing Goal: Will remain free from infection Outcome: Progressing Goal: Diagnostic test results will improve Outcome: Progressing Goal: Respiratory complications will improve Outcome: Progressing Goal: Cardiovascular complication will be avoided Outcome: Progressing   Problem: Activity: Goal: Risk for activity intolerance will decrease Outcome: Progressing   Problem: Nutrition: Goal: Adequate nutrition will be maintained Outcome: Progressing   Problem: Coping: Goal: Level of anxiety will decrease Outcome: Progressing   Problem: Elimination: Goal: Will not experience complications related to bowel motility Outcome:  Progressing Goal: Will not experience complications related to urinary retention Outcome: Progressing   Problem: Pain Managment: Goal: General experience of comfort will improve Outcome: Progressing   Problem: Safety: Goal: Ability to remain free from injury will improve Outcome: Progressing   Problem: Skin Integrity: Goal: Risk for impaired skin integrity will decrease Outcome: Progressing

## 2022-07-09 ENCOUNTER — Inpatient Hospital Stay (HOSPITAL_COMMUNITY): Payer: BC Managed Care – PPO

## 2022-07-09 DIAGNOSIS — J939 Pneumothorax, unspecified: Secondary | ICD-10-CM | POA: Diagnosis not present

## 2022-07-09 LAB — CBC
HCT: 21.5 % — ABNORMAL LOW (ref 39.0–52.0)
HCT: 23.5 % — ABNORMAL LOW (ref 39.0–52.0)
Hemoglobin: 7.6 g/dL — ABNORMAL LOW (ref 13.0–17.0)
Hemoglobin: 8.2 g/dL — ABNORMAL LOW (ref 13.0–17.0)
MCH: 33.9 pg (ref 26.0–34.0)
MCH: 33.3 pg (ref 26.0–34.0)
MCHC: 35.3 g/dL (ref 30.0–36.0)
MCHC: 34.9 g/dL (ref 30.0–36.0)
MCV: 96 fL (ref 80.0–100.0)
MCV: 95.5 fL (ref 80.0–100.0)
Platelets: 468 10*3/uL — ABNORMAL HIGH (ref 150–400)
Platelets: 572 10*3/uL — ABNORMAL HIGH (ref 150–400)
RBC: 2.24 MIL/uL — ABNORMAL LOW (ref 4.22–5.81)
RBC: 2.46 MIL/uL — ABNORMAL LOW (ref 4.22–5.81)
RDW: 13.3 % (ref 11.5–15.5)
RDW: 13.2 % (ref 11.5–15.5)
WBC: 9.8 10*3/uL (ref 4.0–10.5)
WBC: 10.6 10*3/uL — ABNORMAL HIGH (ref 4.0–10.5)
nRBC: 0 % (ref 0.0–0.2)
nRBC: 0 % (ref 0.0–0.2)

## 2022-07-09 LAB — BASIC METABOLIC PANEL
Anion gap: 12 (ref 5–15)
BUN: 19 mg/dL (ref 8–23)
CO2: 21 mmol/L — ABNORMAL LOW (ref 22–32)
Calcium: 8.3 mg/dL — ABNORMAL LOW (ref 8.9–10.3)
Chloride: 101 mmol/L (ref 98–111)
Creatinine, Ser: 1.62 mg/dL — ABNORMAL HIGH (ref 0.61–1.24)
GFR, Estimated: 47 mL/min — ABNORMAL LOW (ref >60)
Glucose, Bld: 57 mg/dL — ABNORMAL LOW (ref 70–99)
Potassium: 3.1 mmol/L — ABNORMAL LOW (ref 3.5–5.1)
Sodium: 134 mmol/L — ABNORMAL LOW (ref 135–145)

## 2022-07-09 MED ORDER — POTASSIUM CHLORIDE 10 MEQ/100ML IV SOLN
10.0000 meq | INTRAVENOUS | Status: AC
  Administered 2022-07-09 (×4): 10 meq via INTRAVENOUS
  Filled 2022-07-09 (×2): qty 100

## 2022-07-09 MED ORDER — POTASSIUM CHLORIDE 20 MEQ PO PACK
40.0000 meq | PACK | Freq: Once | ORAL | Status: AC
  Administered 2022-07-09: 40 meq via ORAL
  Filled 2022-07-09: qty 2

## 2022-07-09 NOTE — Progress Notes (Signed)
Mobility Specialist Progress Note    07/09/22 1358  Mobility  Activity Refused mobility   Pt stated the tubes are too much hassle for the person walking and he does not want to walk. Will f/u as schedule permits.   Hildred Alamin Mobility Specialist  Please Psychologist, sport and exercise or Rehab Office at [phone]

## 2022-07-09 NOTE — Progress Notes (Addendum)
[address]       [place] [address]             [phone]       Subjective:   Patient had a better day yesterday.  He is tolerating liquid intake.  He was unable to void overnight and required I/O Cath with removal of 1L of urine.  Remains hopeful to go home today.  Objective: Vital signs in last 24 hours: Temp:  [97.9 F (36.6 C)-98.7 F (37.1 C)] 97.9 F (36.6 C) (12/12 0535) Pulse Rate:  [70-84] 70 (12/12 0535) Resp:  [14-20] 14 (12/12 0535) BP: (100-110)/(62-74) 109/62 (12/12 0534) SpO2:  [93 %-98 %] 94 % (12/12 0535) Weight:  [46 kg] 46 kg (12/12 0534)  Intake/Output from previous day: 12/11 0701 - 12/12 0700 In: 967.8 [P.O.:240; I.V.:727.8] Out: 1370 [Urine:1350; Chest Tube:20]  General appearance: alert, cooperative, and no distress Heart: regular rate and rhythm Lungs: clear to auscultation bilaterally Abdomen: soft, non-tender; bowel sounds normal; no masses,  no organomegaly Extremities: extremities normal, atraumatic, no cyanosis or edema Wound: clean and dry  Lab Results: Recent Labs    07/08/22 0721 07/09/22 0540  WBC 13.1* 9.8  HGB 8.2* 7.6*  HCT 24.0* 21.5*  PLT 546* 468*   BMET:  Recent Labs    07/08/22 0721 07/09/22 0540  NA 128* 134*  K 3.3* 3.1*  CL 91* 101  CO2 22 21*  GLUCOSE 68* 57*  BUN 22 19  CREATININE 1.97* 1.62*  CALCIUM 8.7* 8.3*    PT/INR: No results for input(s): "LABPROT", "INR" in the last 72 hours. ABG    Component Value Date/Time   PHART 7.474 (H) 09/15/2018 0750   HCO3 25.0 09/15/2018 0750   TCO2 20 (L) 09/14/2018 1547   ACIDBASEDEF 6.0 (H) 09/14/2018 1547   O2SAT 94.1 09/15/2018 0750   CBG (last 3)  No results for input(s): "GLUCAP" in the last 72 hours.  Assessment/Plan:  CV- hemodynamically stable Recurrent Left Spontaneous Pneumothorax, S/P Talc Pleurodesis-- CT remains in place to waterseal.  Pneumothorax appears stable... possibly remove CT today vs. Home with Mini  [facility] Right Pneumothorax, persistent air leak.. CT on [facility] GI- Nausea improving, now tolerating liquid intake, patients oral intake is poor at baseline Renal- baseline CKD, Creatinine up to 1.97 yesterday due to dehydration with N/V.Marland Kitchen back to 1.67 this morning, continue IV fluids for now GU- urinary retention required I/O cath last night with removal of 999 ml of urine.. likely side effect of Scopalamine patch will d/c.. monitor closely  H/O Esophageal stricture- can follow up with GI as outpatient Dispo- patient stable, CXR shows stable pneumothorax on CXR with chest tube on water seal, CT management per [name]..AKI on CKD due to dehydration, creatinine improved today at 1.67 continue IV fluids for now.. Urinary retention likely due to scopalamine patch.. will d/c.. monitor closely if patient is for discharge home today he will need to void prior to leaving hospital   LOS: 5 days    Ellwood Handler, PA-C 07/09/2022 Patient seen and examined, agree with above He is not ready for DC yet Needs to void and be able to eat solid food before dc Will leave left pleural tube one more day Will see if possible to get IBV for right side while in hospital Hypokalemia- supplement K  Remo Lipps C. Roxan Hockey, MD [facility] [phone]  Addendum This morning I discussed the possibility of proceeding with endobronchial  valve placement on the right side for management of his airleak.  We wanted to confirm that we would have valves available.  Those should be delivered by tomorrow morning.  I advised him to undergo bronchoscopy with placement of endobronchial valves in hopes of lessening his airleak and speed in the healing process for the large air leak on the right side.  He understands the general nature of the procedure.  He understands the indications, risks, benefits, and alternatives.  He understands the risks include those associated with general anesthesia.   He understands there is no guarantee the airleak will resolve even with this intervention.  He understands we would plan to go back and remove the valves at a later date.  He accepts the risks and agrees to proceed.  Plan bronchoscopy for endobronchial valve placement tomorrow.  Revonda Standard Roxan Hockey, MD [facility] [phone]

## 2022-07-09 NOTE — Progress Notes (Signed)
Patient was only able to swallow about 75% of his morning meds, he states his throat is very swollen and his esophagus needs to be stretched again--I'm not exactly sure which meds he did not take--I did advise that he not take amlodipine at this moment with blood pressure being 105/71--possibility being that he may end up with bp too low--we could hold off on that med for right now and administer later if bp were to get higher--patient refused to wait and states he wants to take all his meds at same time

## 2022-07-09 NOTE — Progress Notes (Signed)
Patient complains of burning with urination. Patient having minimal output.  Bladder scan showing greater than 999.  In and out completed with 1039mL removed.

## 2022-07-09 NOTE — Plan of Care (Signed)
  Problem: Clinical Measurements: Goal: Diagnostic test results will improve Outcome: Progressing Goal: Cardiovascular complication will be avoided Outcome: Progressing   Problem: Activity: Goal: Risk for activity intolerance will decrease Outcome: Progressing   Problem: Coping: Goal: Level of anxiety will decrease Outcome: Progressing   Problem: Pain Managment: Goal: General experience of comfort will improve Outcome: Progressing   Problem: Safety: Goal: Ability to remain free from injury will improve Outcome: Progressing

## 2022-07-09 NOTE — TOC CM/SW Note (Signed)
Patient discussed in QC meeting today with TOC Director Zack Brooks and leader Jessica Scinto   

## 2022-07-10 ENCOUNTER — Inpatient Hospital Stay (HOSPITAL_COMMUNITY): Payer: BC Managed Care – PPO

## 2022-07-10 ENCOUNTER — Inpatient Hospital Stay (HOSPITAL_COMMUNITY): Payer: BC Managed Care – PPO | Admitting: Anesthesiology

## 2022-07-10 ENCOUNTER — Encounter (HOSPITAL_COMMUNITY)
Admission: EM | Disposition: A | Discharge status: Home / Self Care | Payer: Self-pay | Source: Home / Self Care | Attending: Thoracic Surgery (Cardiothoracic Vascular Surgery)

## 2022-07-10 DIAGNOSIS — J95812 Postprocedural air leak: Secondary | ICD-10-CM | POA: Diagnosis not present

## 2022-07-10 HISTORY — PX: VIDEO BRONCHOSCOPY WITH INSERTION OF INTERBRONCHIAL VALVE (IBV): SHX6178

## 2022-07-10 LAB — BASIC METABOLIC PANEL
Anion gap: 12 (ref 5–15)
BUN: 13 mg/dL (ref 8–23)
CO2: 21 mmol/L — ABNORMAL LOW (ref 22–32)
Calcium: 8.6 mg/dL — ABNORMAL LOW (ref 8.9–10.3)
Chloride: 99 mmol/L (ref 98–111)
Creatinine, Ser: 1.37 mg/dL — ABNORMAL HIGH (ref 0.61–1.24)
GFR, Estimated: 57 mL/min — ABNORMAL LOW (ref >60)
Glucose, Bld: 85 mg/dL (ref 70–99)
Potassium: 3.6 mmol/L (ref 3.5–5.1)
Sodium: 132 mmol/L — ABNORMAL LOW (ref 135–145)

## 2022-07-10 LAB — CBC
HCT: 25.4 % — ABNORMAL LOW (ref 39.0–52.0)
Hemoglobin: 8.6 g/dL — ABNORMAL LOW (ref 13.0–17.0)
MCH: 32.3 pg (ref 26.0–34.0)
MCHC: 33.9 g/dL (ref 30.0–36.0)
MCV: 95.5 fL (ref 80.0–100.0)
Platelets: 588 10*3/uL — ABNORMAL HIGH (ref 150–400)
RBC: 2.66 MIL/uL — ABNORMAL LOW (ref 4.22–5.81)
RDW: 13.3 % (ref 11.5–15.5)
WBC: 11.2 10*3/uL — ABNORMAL HIGH (ref 4.0–10.5)
nRBC: 0 % (ref 0.0–0.2)

## 2022-07-10 LAB — TYPE AND SCREEN
ABO/RH(D): A POS
Antibody Screen: NEGATIVE

## 2022-07-10 LAB — SARS CORONAVIRUS 2 BY RT PCR: SARS Coronavirus 2 by RT PCR: NEGATIVE

## 2022-07-10 SURGERY — BRONCHOSCOPY, FLEXIBLE, WITH INTRABRONCHIAL VALVE INSERTION
Anesthesia: General

## 2022-07-10 MED ORDER — CHLORHEXIDINE GLUCONATE 0.12 % MT SOLN
OROMUCOSAL | Status: AC
  Filled 2022-07-10: qty 15

## 2022-07-10 MED ORDER — FENTANYL CITRATE (PF) 250 MCG/5ML IJ SOLN
INTRAMUSCULAR | Status: AC
  Filled 2022-07-10: qty 5

## 2022-07-10 MED ORDER — ROCURONIUM BROMIDE 10 MG/ML (PF) SYRINGE
PREFILLED_SYRINGE | INTRAVENOUS | Status: DC | PRN
  Administered 2022-07-10: 50 mg via INTRAVENOUS

## 2022-07-10 MED ORDER — EPINEPHRINE PF 1 MG/ML IJ SOLN
INTRAMUSCULAR | Status: DC | PRN
  Administered 2022-07-10: 1 mg via ENDOTRACHEOPULMONARY

## 2022-07-10 MED ORDER — ONDANSETRON HCL 4 MG/2ML IJ SOLN
INTRAMUSCULAR | Status: DC | PRN
  Administered 2022-07-10: 4 mg via INTRAVENOUS

## 2022-07-10 MED ORDER — ORAL CARE MOUTH RINSE: 15.0000 mL | Freq: Once | OROMUCOSAL | Status: AC

## 2022-07-10 MED ORDER — PROPOFOL 10 MG/ML IV BOLUS
INTRAVENOUS | Status: DC | PRN
  Administered 2022-07-10: 80 mg via INTRAVENOUS

## 2022-07-10 MED ORDER — LIDOCAINE 2% (20 MG/ML) 5 ML SYRINGE
INTRAMUSCULAR | Status: DC | PRN
  Administered 2022-07-10: 60 mg via INTRAVENOUS

## 2022-07-10 MED ORDER — LACTATED RINGERS IV SOLN: INTRAVENOUS | Status: DC

## 2022-07-10 MED ORDER — HYDROMORPHONE HCL 1 MG/ML IJ SOLN: 0.2500 mg | INTRAMUSCULAR | Status: DC | PRN

## 2022-07-10 MED ORDER — PHENYLEPHRINE 80 MCG/ML (10ML) SYRINGE FOR IV PUSH (FOR BLOOD PRESSURE SUPPORT)
PREFILLED_SYRINGE | INTRAVENOUS | Status: DC | PRN
  Administered 2022-07-10 (×5): 160 ug via INTRAVENOUS

## 2022-07-10 MED ORDER — FENTANYL CITRATE (PF) 250 MCG/5ML IJ SOLN
INTRAMUSCULAR | Status: DC | PRN
  Administered 2022-07-10: 100 ug via INTRAVENOUS

## 2022-07-10 MED ORDER — CHLORHEXIDINE GLUCONATE 0.12 % MT SOLN
15.0000 mL | Freq: Once | OROMUCOSAL | Status: AC
  Administered 2022-07-10: 15 mL via OROMUCOSAL

## 2022-07-10 MED ORDER — EPINEPHRINE PF 1 MG/ML IJ SOLN
INTRAMUSCULAR | Status: AC
  Filled 2022-07-10: qty 1

## 2022-07-10 MED ORDER — PHENYLEPHRINE HCL-NACL 20-0.9 MG/250ML-% IV SOLN
INTRAVENOUS | Status: DC | PRN
  Administered 2022-07-10: 50 ug/min via INTRAVENOUS

## 2022-07-10 MED ORDER — DEXAMETHASONE SODIUM PHOSPHATE 10 MG/ML IJ SOLN
INTRAMUSCULAR | Status: DC | PRN
  Administered 2022-07-10: 5 mg via INTRAVENOUS

## 2022-07-10 MED ORDER — SUGAMMADEX SODIUM 200 MG/2ML IV SOLN
INTRAVENOUS | Status: DC | PRN
  Administered 2022-07-10: 200 mg via INTRAVENOUS

## 2022-07-10 MED ORDER — MIDAZOLAM HCL 2 MG/2ML IJ SOLN
INTRAMUSCULAR | Status: AC
  Filled 2022-07-10: qty 2

## 2022-07-10 SURGICAL SUPPLY — 46 items
ADAPTER VALVE BIOPSY EBUS (MISCELLANEOUS) IMPLANT
ADPTR VALVE BIOPSY EBUS (MISCELLANEOUS)
BLADE CLIPPER SURG (BLADE) ×2 IMPLANT
CANISTER SUCT 3000ML PPV (MISCELLANEOUS) ×2 IMPLANT
CATH BALLN 4FR (CATHETERS) IMPLANT
CATH EMB 6FR 80CM (CATHETERS) IMPLANT
CATH LOADER DEPLOYMENT HUD (CATHETERS) IMPLANT
CNTNR URN SCR LID CUP LEK RST (MISCELLANEOUS) ×2 IMPLANT
CONT SPEC 4OZ STRL OR WHT (MISCELLANEOUS) ×1
COVER BACK TABLE 60X90IN (DRAPES) ×2 IMPLANT
FILTER STRAW FLUID ASPIR (MISCELLANEOUS) IMPLANT
FORCEPS BIOP RJ4 1.8 (CUTTING FORCEPS) IMPLANT
FORCEPS RADIAL JAW LRG 4 PULM (INSTRUMENTS) IMPLANT
GAUZE 4X4 16PLY ~~LOC~~+RFID DBL (SPONGE) ×2 IMPLANT
GAUZE SPONGE 4X4 12PLY STRL (GAUZE/BANDAGES/DRESSINGS) ×2 IMPLANT
GAUZE XEROFORM 1X8 LF (GAUZE/BANDAGES/DRESSINGS) IMPLANT
GLOVE SS BIOGEL STRL SZ 7.5 (GLOVE) ×2 IMPLANT
GLOVE SURG SIGNA 7.5 PF LTX (GLOVE) ×2 IMPLANT
GOWN STRL REUS W/ TWL XL LVL3 (GOWN DISPOSABLE) ×2 IMPLANT
GOWN STRL REUS W/TWL XL LVL3 (GOWN DISPOSABLE) ×1
KIT AIRWAY SIZING HUD (KITS) IMPLANT
KIT CLEAN ENDO COMPLIANCE (KITS) ×2 IMPLANT
KIT TURNOVER KIT B (KITS) ×2 IMPLANT
MARKER PEN SURG W/LABELS BLK (STERILIZATION PRODUCTS) IMPLANT
MARKER SKIN DUAL TIP RULER LAB (MISCELLANEOUS) ×2 IMPLANT
NS IRRIG 1000ML POUR BTL (IV SOLUTION) ×2 IMPLANT
OIL SILICONE PENTAX (PARTS (SERVICE/REPAIRS)) IMPLANT
PAD ARMBOARD 7.5X6 YLW CONV (MISCELLANEOUS) ×4 IMPLANT
SPONGE T-LAP 18X18 ~~LOC~~+RFID (SPONGE) ×8 IMPLANT
SPONGE T-LAP 4X18 ~~LOC~~+RFID (SPONGE) ×2 IMPLANT
STOPCOCK 4 WAY LG BORE MALE ST (IV SETS) IMPLANT
STOPCOCK MORSE 400PSI 3WAY (MISCELLANEOUS) ×2 IMPLANT
SYR 10ML LL (SYRINGE) ×2 IMPLANT
SYR 20ML ECCENTRIC (SYRINGE) ×2 IMPLANT
SYR 3ML LL SCALE MARK (SYRINGE) IMPLANT
TOWEL GREEN STERILE (TOWEL DISPOSABLE) ×2 IMPLANT
TOWEL GREEN STERILE FF (TOWEL DISPOSABLE) ×2 IMPLANT
TOWEL NATURAL 4PK STERILE (DISPOSABLE) ×2 IMPLANT
TRAP SPECIMEN MUCUS 40CC (MISCELLANEOUS) ×2 IMPLANT
TUBE CONNECTING 20X1/4 (TUBING) ×2 IMPLANT
UNDERPAD 30X36 HEAVY ABSORB (UNDERPADS AND DIAPERS) ×2 IMPLANT
VALVE BIOPSY  SINGLE USE (MISCELLANEOUS) ×1
VALVE BIOPSY SINGLE USE (MISCELLANEOUS) ×2 IMPLANT
VALVE IN CARTRIDGE 9MM HUD (Valve) IMPLANT
VALVE SUCTION BRONCHIO DISP (MISCELLANEOUS) ×2 IMPLANT
WATER STERILE IRR 1000ML POUR (IV SOLUTION) ×2 IMPLANT

## 2022-07-10 NOTE — Transfer of Care (Signed)
Immediate Anesthesia Transfer of Care Note  Patient: Sean Rose  Procedure(s) Performed: VIDEO BRONCHOSCOPY WITH INSERTION OF INTERBRONCHIAL VALVE (IBV)  Patient Location: PACU  Anesthesia Type:General  Level of Consciousness: awake, alert , and oriented  Airway & Oxygen Therapy: Patient Spontanous Breathing  Post-op Assessment: Report given to RN, Post -op Vital signs reviewed and stable, and Patient moving all extremities X 4  Post vital signs: Reviewed and stable  Last Vitals:  Vitals Value Taken Time  BP 122/88 07/10/22 1416  Temp    Pulse 90 07/10/22 1418  Resp 20 07/10/22 1418  SpO2 96 % 07/10/22 1418  Vitals shown include unvalidated device data.  Last Pain:  Vitals:   07/10/22 1207  TempSrc:   PainSc: 0-No pain      Patients Stated Pain Goal: 0 (42/68/34 1962)  Complications: No notable events documented.

## 2022-07-10 NOTE — Brief Op Note (Signed)
07/10/2022  2:10 PM  PATIENT:  Sean Rose  66 y.o. male  PRE-OPERATIVE DIAGNOSIS:  PERSISTENT AIRLEAK  POST-OPERATIVE DIAGNOSIS:   PERSISTENT AIRLEAK  PROCEDURE:  Procedure(s): VIDEO BRONCHOSCOPY WITH INSERTION OF INTERBRONCHIAL VALVE (IBV) (N/A) x 2  61mm valves placed medial and lateral segments of RML  SURGEON:  Surgeon(s) and Role:    * Melrose Nakayama, MD - Primary  PHYSICIAN ASSISTANT:   ASSISTANTS: none   ANESTHESIA:   general  EBL:  none   BLOOD ADMINISTERED:none  DRAINS: none   LOCAL MEDICATIONS USED:  NONE  SPECIMEN:  No Specimen  DISPOSITION OF SPECIMEN:  N/A  COUNTS:  YES  TOURNIQUET:  * No tourniquets in log *  DICTATION: .Other Dictation: Dictation Number -  PLAN OF CARE:  already inpatient  PATIENT DISPOSITION:  PACU - hemodynamically stable.   Delay start of Pharmacological VTE agent (>24hrs) due to surgical blood loss or risk of bleeding: no

## 2022-07-10 NOTE — Op Note (Signed)
NAME: Sean Rose, Sean W. MEDICAL RECORD NO: [id number] ACCOUNT NO: [id number] DATE OF BIRTH: 02-Oct-1955 FACILITY: [facility] LOCATION: [facility] PHYSICIAN: Revonda Standard. Roxan Hockey, MD  Operative Report   DATE OF PROCEDURE: 07/10/2022  PREOPERATIVE DIAGNOSIS:  Resolved air leak on left and persistent air leak on right.  POSTOPERATIVE DIAGNOSIS:  Resolved air leak on left and persistent air leak on right.  PROCEDURE:   1.Removal of left pleural catheter.   2.Bronchoscopy with endobronchial valve placement x2 (9 mm valves in medial and lateral segmental bronchi of right middle lobe).  SURGEON:  Revonda Standard. Roxan Hockey, MD  ASSISTANT:  None.  ANESTHESIA:  General.  FINDINGS:  Minimal air leak with positive pressure ventilation.  Well-healed right upper lobe bronchial stump.  No endobronchial lesions to the level of subsegmental bronchi.  CLINICAL NOTE:  Sean Rose is a 66 year old man with severe COPD who had a loculated right pneumothorax with pneumomediastinum requiring tube placement.  He has had a persistent air leak from the right.  He then developed a left pneumothorax that was treated  with tube drainage.  He had an early recurrence and then underwent talc pleurodesis and now has had no drainage or air leak for 3 days.  He was offered the option of an attempt for endobronchial valve placement to help resolve  his persistent right lung air leak.  The indications, risks, benefits, and alternatives were discussed in detail with the patient.  He understood this was off label use.  He accepted the risks and agreed to proceed.  DESCRIPTION OF PROCEDURE:  Sean Rose was brought to the operating room on 07/10/2022.  He had induction of general anesthesia and was intubated.  Bair Hugger was placed for active warming and sequential compression devices were placed for DVT  prophylaxis.  A timeout was performed.  The left pleural catheter was removed and an occlusive dressing was left in place.  Flexible  fiberoptic bronchoscopy then was performed via the endotracheal tube.  The right upper lobe stump was well healed.  There  were no endobronchial lesions to the level of subsegmental bronchi.  There were minimal secretions.  The airways in general were enlarged. Because of the air leak had nearly completely resolved with initiation of positive pressure ventilation, it was  not possible to definitively isolate the air leak. Based on the CT findings, it was felt that this most likely was due to right middle lobe blebs, so the decision was made to occlude the right middle lobe bronchi.  The medial and lateral segmental bronchi  sized for 9 mm valves.  Valve initially was placed in the medial segmental bronchi, but did not seat well and was removed. A 9 mm valve was placed in the lateral segmental bronchus and was well seated with good positioning.  Then, a 9 mm valve was placed  into the medial segmental bronchus and this also was well seated with good positioning. Of note, the air leak did stop altogether after occlusion of the right middle lobe bronchi.  Resolution of the leak did take several minutes after placement of the valves.  The bronchoscope was removed.  The patient was extubated in the operating room and taken to the postanesthetic care unit in good condition.   VAI D: 07/10/2022 6:46:13 pm T: 07/10/2022 10:48:00 pm  JOB: [id number]/ [id number]

## 2022-07-10 NOTE — Consult Note (Signed)
   [facility] Inpatient Consult   07/10/2022  [name] 24-Mar-1956 [id number]  [place] Organization [ACO] Patient: Sean Rose [facility] PPO  Primary Care Provider:  [name], NP with [place] [facility] which is listed to provide the transition of care follow up  Patient screened for less than 30 days readmission hospitalization with noted extreme high risk score for unplanned readmission risk and length of stay and to assess for potential [place] Management service needs for post hospital transition for care coordination.  Reviewed for needs, patient with chest tube.  Plan:  Continue to follow progress and disposition to assess for post hospital community care coordination/management needs.  Referral request for community care coordination:  ongoing follow up  Of note, [place] does not replace or interfere with any arrangements made by the Inpatient Transition of Care team.  For questions contact:   Natividad Brood, RN BSN [place]  [phone] business mobile phone Toll free office [phone]  *[place]  [phone] Fax number: [phone] [email or web address]

## 2022-07-10 NOTE — Anesthesia Postprocedure Evaluation (Signed)
Anesthesia Post Note  Patient: Sean Rose  Procedure(s) Performed: VIDEO BRONCHOSCOPY WITH INSERTION OF INTERBRONCHIAL VALVE (IBV)     Patient location during evaluation: PACU Anesthesia Type: General Level of consciousness: awake Pain management: pain level controlled Vital Signs Assessment: post-procedure vital signs reviewed and stable Respiratory status: spontaneous breathing Cardiovascular status: stable Postop Assessment: no apparent nausea or vomiting Anesthetic complications: no   No notable events documented.  Last Vitals:  Vitals:   07/10/22 1600 07/10/22 1626  BP: 111/79 113/80  Pulse: 87 88  Resp: 19 18  Temp:    SpO2:  95%    Last Pain:  Vitals:   07/10/22 1530  TempSrc: Oral  PainSc: 0-No pain                 Levenia Skalicky

## 2022-07-10 NOTE — Progress Notes (Addendum)
      [address]       [place] [address]             [phone]      Procedure(s) (LRB): VIDEO BRONCHOSCOPY WITH INSERTION OF INTERBRONCHIAL VALVE (IBV) (N/A)  Subjective:  Patient had a rough night due to urinary issues.  This has since resolved.  He denies N/V.  He was able to eat soup and apple sauce yesterday.  He states his esophagus overall feels better.  He does ambulated  Objective: Vital signs in last 24 hours: Temp:  [98.1 F (36.7 C)-98.5 F (36.9 C)] 98.1 F (36.7 C) (12/13 0317) Pulse Rate:  [75-93] 93 (12/13 0317) Cardiac Rhythm: Normal sinus rhythm (12/12 2104) Resp:  [17-20] 20 (12/13 0317) BP: (105-139)/(71-85) 139/85 (12/13 0317) SpO2:  [93 %-95 %] 95 % (12/13 0317) Weight:  [44.1 kg] 44.1 kg (12/13 0410)  Intake/Output from previous day: 12/12 0701 - 12/13 0700 In: 1680.4 [P.O.:470; I.V.:1210.4] Out: 2075 [Urine:2075]  General appearance: alert, cooperative, and no distress Heart: regular rate and rhythm Lungs: clear to auscultation bilaterally Abdomen: soft, non-tender; bowel sounds normal; no masses,  no organomegaly Extremities: extremities normal, atraumatic, no cyanosis or edema Wound: clean and dry  Lab Results: Recent Labs    07/09/22 1855 07/10/22 0550  WBC 10.6* 11.2*  HGB 8.2* 8.6*  HCT 23.5* 25.4*  PLT 572* 588*   BMET:  Recent Labs    07/09/22 0540 07/10/22 0550  NA 134* 132*  K 3.1* 3.6  CL 101 99  CO2 21* 21*  GLUCOSE 57* 85  BUN 19 13  CREATININE 1.62* 1.37*  CALCIUM 8.3* 8.6*    PT/INR: No results for input(s): "LABPROT", "INR" in the last 72 hours. ABG    Component Value Date/Time   PHART 7.474 (H) 09/15/2018 0750   HCO3 25.0 09/15/2018 0750   TCO2 20 (L) 09/14/2018 1547   ACIDBASEDEF 6.0 (H) 09/14/2018 1547   O2SAT 94.1 09/15/2018 0750   CBG (last 3)  No results for input(s): "GLUCAP" in the last 72 hours.  Assessment/Plan: S/P Procedure(s) (LRB): VIDEO BRONCHOSCOPY WITH INSERTION  OF INTERBRONCHIAL VALVE (IBV) (N/A)  CV-hemodynamically stable- toprol xl, norvasc Spontaneous Recurrent Pneumothorax on Left- CT in place w/o air leak, CXR shows stable pneumothorax.. possibly d/c chest tube today Right Pneumothorax resolved, CT in place on Mini Express with persistent air leak... for IBVs today GI- nausea resolved, continue prn anti-emetics Renal- baseline CKD, creatinine improved to 1.37.. will stop IV fluids Hypokalemia- improved GU- urinary retention, has resolved patient voiding on her own Esophageal stricture- improved, patient states able to swallow pills Dispo- patient stable, for IBV today, ? Remove left sided chest tube sided?   LOS: 6 days    Ellwood Handler, PA-C 07/10/2022 Patient seen and examined, agree with above For IBV today No air leak and minimal drainage form left pleural tube. CXR stable to improved- will dc left CT while in OR  Bo Teicher C. Roxan Hockey, MD [facility] [phone]

## 2022-07-10 NOTE — Anesthesia Preprocedure Evaluation (Signed)
Anesthesia Evaluation  Patient identified by MRN, date of birth, ID band Patient awake    Reviewed: Allergy & Precautions, NPO status , Patient's Chart, lab work & pertinent test results  Airway Mallampati: II       Dental   Pulmonary pneumonia, COPD, former smoker   breath sounds clear to auscultation       Cardiovascular hypertension, + angina  + CAD, + Past MI and + Peripheral Vascular Disease  + Valvular Problems/Murmurs  Rhythm:Regular Rate:Normal     Neuro/Psych    GI/Hepatic Neg liver ROS,GERD  ,,  Endo/Other    Renal/GU Renal disease     Musculoskeletal   Abdominal   Peds  Hematology   Anesthesia Other Findings   Reproductive/Obstetrics                             Anesthesia Physical Anesthesia Plan  ASA: 3  Anesthesia Plan: General   Post-op Pain Management: Tylenol PO (pre-op)*   Induction: Intravenous  PONV Risk Score and Plan: 2 and Ondansetron, Dexamethasone and Midazolam  Airway Management Planned: Oral ETT  Additional Equipment:   Intra-op Plan:   Post-operative Plan: Extubation in OR  Informed Consent: I have reviewed the patients History and Physical, chart, labs and discussed the procedure including the risks, benefits and alternatives for the proposed anesthesia with the patient or authorized representative who has indicated his/her understanding and acceptance.     Dental advisory given  Plan Discussed with: CRNA and Anesthesiologist  Anesthesia Plan Comments:        Anesthesia Quick Evaluation

## 2022-07-10 NOTE — Anesthesia Procedure Notes (Signed)
Procedure Name: Intubation Date/Time: 07/10/2022 1:30 PM  Performed by: Rande Brunt, CRNAPre-anesthesia Checklist: Patient identified, Emergency Drugs available, Suction available and Patient being monitored Patient Re-evaluated:Patient Re-evaluated prior to induction Oxygen Delivery Method: Circle System Utilized Preoxygenation: Pre-oxygenation with 100% oxygen Induction Type: IV induction Ventilation: Mask ventilation without difficulty Laryngoscope Size: Mac and 4 Grade View: Grade I Tube type: Oral Tube size: 8.0 mm Number of attempts: 1 Airway Equipment and Method: Stylet and Oral airway Placement Confirmation: ETT inserted through vocal cords under direct vision, positive ETCO2 and breath sounds checked- equal and bilateral Secured at: 23 cm Tube secured with: Tape Dental Injury: Teeth and Oropharynx as per pre-operative assessment

## 2022-07-10 NOTE — Plan of Care (Signed)

## 2022-07-11 ENCOUNTER — Inpatient Hospital Stay (HOSPITAL_COMMUNITY): Payer: BC Managed Care – PPO

## 2022-07-11 ENCOUNTER — Other Ambulatory Visit: Payer: Self-pay | Admitting: Thoracic Surgery (Cardiothoracic Vascular Surgery)

## 2022-07-11 ENCOUNTER — Ambulatory Visit: Payer: BC Managed Care – PPO | Admitting: Thoracic Surgery (Cardiothoracic Vascular Surgery)

## 2022-07-11 DIAGNOSIS — J930 Spontaneous tension pneumothorax: Secondary | ICD-10-CM

## 2022-07-11 DIAGNOSIS — Z4682 Encounter for fitting and adjustment of non-vascular catheter: Secondary | ICD-10-CM

## 2022-07-11 LAB — CBC
HCT: 25.1 % — ABNORMAL LOW (ref 39.0–52.0)
Hemoglobin: 8.8 g/dL — ABNORMAL LOW (ref 13.0–17.0)
MCH: 33.1 pg (ref 26.0–34.0)
MCHC: 35.1 g/dL (ref 30.0–36.0)
MCV: 94.4 fL (ref 80.0–100.0)
Platelets: 590 10*3/uL — ABNORMAL HIGH (ref 150–400)
RBC: 2.66 MIL/uL — ABNORMAL LOW (ref 4.22–5.81)
RDW: 13.2 % (ref 11.5–15.5)
WBC: 6.9 10*3/uL (ref 4.0–10.5)
nRBC: 0 % (ref 0.0–0.2)

## 2022-07-11 LAB — CREATININE, SERUM
Creatinine, Ser: 1.49 mg/dL — ABNORMAL HIGH (ref 0.61–1.24)
GFR, Estimated: 51 mL/min — ABNORMAL LOW (ref >60)

## 2022-07-11 MED ORDER — OXYCODONE HCL 5 MG PO TABS: 5.0000 mg | ORAL_TABLET | ORAL | 0 refills | Status: DC | PRN

## 2022-07-11 MED ORDER — HEPARIN SOD (PORK) LOCK FLUSH 100 UNIT/ML IV SOLN
500.0000 [IU] | INTRAVENOUS | Status: AC | PRN
  Administered 2022-07-11: 500 [IU]

## 2022-07-11 NOTE — Progress Notes (Addendum)
      [address]       [place] [address]             [phone]      1 Day Post-Op Procedure(s) (LRB): VIDEO BRONCHOSCOPY WITH INSERTION OF INTERBRONCHIAL VALVE (IBV) (N/A)  Subjective:  Patient had a good night.  He has been sleeping well.  He is ready to go home today.  Objective: Vital signs in last 24 hours: Temp:  [97.8 F (36.6 C)-98.5 F (36.9 C)] 98 F (36.7 C) (12/14 0345) Pulse Rate:  [71-95] 71 (12/14 0345) Cardiac Rhythm: Normal sinus rhythm (12/13 2000) Resp:  [16-23] 20 (12/14 0345) BP: (109-129)/(75-88) 109/78 (12/14 0345) SpO2:  [93 %-99 %] 95 % (12/14 0745) Weight:  [42.2 kg] 42.2 kg (12/14 0500)  Intake/Output from previous day: 12/13 0701 - 12/14 0700 In: 960 [P.O.:360; I.V.:600] Out: 7482 [Urine:1100; Chest Tube:12]  General appearance: alert, cooperative, and no distress Heart: regular rate and rhythm Lungs: clear to auscultation bilaterally Abdomen: soft, non-tender; bowel sounds normal; no masses,  no organomegaly Extremities: extremities normal, atraumatic, no cyanosis or edema Wound: clean and dry  Lab Results: Recent Labs    07/10/22 0550 07/11/22 0449  WBC 11.2* 6.9  HGB 8.6* 8.8*  HCT 25.4* 25.1*  PLT 588* 590*   BMET:  Recent Labs    07/09/22 0540 07/10/22 0550 07/11/22 0449  NA 134* 132*  --   K 3.1* 3.6  --   CL 101 99  --   CO2 21* 21*  --   GLUCOSE 57* 85  --   BUN 19 13  --   CREATININE 1.62* 1.37* 1.49*  CALCIUM 8.3* 8.6*  --     PT/INR: No results for input(s): "LABPROT", "INR" in the last 72 hours. ABG    Component Value Date/Time   PHART 7.474 (H) 09/15/2018 0750   HCO3 25.0 09/15/2018 0750   TCO2 20 (L) 09/14/2018 1547   ACIDBASEDEF 6.0 (H) 09/14/2018 1547   O2SAT 94.1 09/15/2018 0750   CBG (last 3)  No results for input(s): "GLUCAP" in the last 72 hours.  Assessment/Plan: S/P Procedure(s) (LRB): VIDEO BRONCHOSCOPY WITH INSERTION OF INTERBRONCHIAL VALVE (IBV) (N/A)  CV-  hemodynamically stable-continue Norvasc, Toprol XL Spontaneous Recurrent Pneumothorax on Left- S/P Pleurodesis, CT removed yesterday, CXR with some improvement in left side pneumothorax Right Sided Pneumothorax-resolved, was on Mini with persistent air leak, S/P placement of IBV-- no air leak present on my exam, however can hear air movement through tubing.. will likely place back to Mini Express today Renal- baseline CKD, creatinine remains stable DIspo- overall patient doing well, will plan to d/c home today with Mini Express on Right side if okay with [name]   LOS: 7 days    Ellwood Handler, PA-C 07/11/2022  Patient seen and examined, agree with above Left lung stable post chest tube removal No air leak on right but audible noise in tubing- I suspect he has a space.  Not comfortable removing right chest tube yet, but happy with result Will place to Mini express and dc home later today  Remo Lipps C. Roxan Hockey, MD [facility] [phone]

## 2022-07-11 NOTE — Progress Notes (Signed)
Mobility Specialist Progress Note    07/11/22 1022  Mobility  Activity Refused mobility   Pt stated "I don't need to walk, I'll be walking home."  [place] Specialist  Please Contact via SecureChat or Rehab Office at [phone]

## 2022-07-12 ENCOUNTER — Telehealth: Payer: Self-pay

## 2022-07-12 ENCOUNTER — Encounter (HOSPITAL_COMMUNITY): Payer: Self-pay | Admitting: Thoracic Surgery (Cardiothoracic Vascular Surgery)

## 2022-07-12 NOTE — Patient Outreach (Signed)
  Care Coordination TOC Note Transition Care Management Unsuccessful Follow-up Telephone Call  Date of discharge and from where:  07/11/22-[place]   Attempts:  1st Attempt  Reason for unsuccessful TCM follow-up call:  No answer/busy     Enzo Montgomery, RN,BSN,CCM [place] Management Telephonic Care Management Coordinator Direct Phone: [phone] Toll Free: [phone] Fax: [phone]

## 2022-07-12 NOTE — Patient Outreach (Signed)
  Care Coordination TOC Note Transition Care Management Unsuccessful Follow-up Telephone Call  Date of discharge and from where:  07/11/22-[place]  Attempts:  2nd Attempt  Reason for unsuccessful TCM follow-up call:  Left voice message     Hetty Blend [place] Management Telephonic Care Management Coordinator Direct Phone: [phone] Toll Free: [phone] Fax: [phone]

## 2022-07-15 ENCOUNTER — Telehealth: Payer: Self-pay

## 2022-07-15 ENCOUNTER — Telehealth: Payer: Self-pay | Admitting: Internal Medicine

## 2022-07-15 NOTE — Patient Outreach (Signed)
  Care Coordination TOC Note Transition Care Management Unsuccessful Follow-up Telephone Call  Date of discharge and from where:  07/11/22-[place]  Attempts:  3rd Attempt  Reason for unsuccessful TCM follow-up call:  Unable to reach patient     Hetty Blend [place] Management Telephonic Care Management Coordinator Direct Phone: [phone] Toll Free: [phone] Fax: [phone]

## 2022-07-15 NOTE — Telephone Encounter (Signed)
Called patient regarding January and March appointments, left a voicemail.

## 2022-07-17 ENCOUNTER — Other Ambulatory Visit: Payer: Self-pay | Admitting: Thoracic Surgery (Cardiothoracic Vascular Surgery)

## 2022-07-17 DIAGNOSIS — Z9689 Presence of other specified functional implants: Secondary | ICD-10-CM

## 2022-07-25 ENCOUNTER — Ambulatory Visit: Payer: BC Managed Care – PPO | Admitting: Thoracic Surgery (Cardiothoracic Vascular Surgery)

## 2022-07-30 ENCOUNTER — Ambulatory Visit
Admission: RE | Admit: 2022-07-30 | Discharge: 2022-07-30 | Disposition: A | Discharge status: Home / Self Care | Payer: BC Managed Care – PPO | Source: Ambulatory Visit | Attending: Thoracic Surgery (Cardiothoracic Vascular Surgery) | Admitting: Thoracic Surgery (Cardiothoracic Vascular Surgery)

## 2022-07-30 ENCOUNTER — Other Ambulatory Visit: Payer: Self-pay | Admitting: Thoracic Surgery (Cardiothoracic Vascular Surgery)

## 2022-07-30 ENCOUNTER — Ambulatory Visit (INDEPENDENT_AMBULATORY_CARE_PROVIDER_SITE_OTHER): Payer: BC Managed Care – PPO | Admitting: Thoracic Surgery (Cardiothoracic Vascular Surgery)

## 2022-07-30 VITALS — BP 109/79 | HR 90 | Resp 20 | Wt 93.0 lb

## 2022-07-30 DIAGNOSIS — J939 Pneumothorax, unspecified: Secondary | ICD-10-CM

## 2022-07-30 DIAGNOSIS — J930 Spontaneous tension pneumothorax: Secondary | ICD-10-CM | POA: Diagnosis not present

## 2022-07-30 DIAGNOSIS — J439 Emphysema, unspecified: Secondary | ICD-10-CM | POA: Diagnosis not present

## 2022-07-30 DIAGNOSIS — Z9689 Presence of other specified functional implants: Secondary | ICD-10-CM

## 2022-07-30 DIAGNOSIS — J90 Pleural effusion, not elsewhere classified: Secondary | ICD-10-CM | POA: Diagnosis not present

## 2022-07-30 NOTE — Progress Notes (Signed)
[address]       [place] [address]             [phone]       HPI: [name] returns for follow-up of his pneumothoraces and chest tube  Sean Rose is a 67 year old man with a history of tobacco abuse, severe COPD, stage IIIa non-small cell carcinoma, right upper lobectomy, hypertension, hyperlipidemia, CAD, PAD, reflux, heart murmur, anemia, and bilateral pneumothoraces.  Found to have a loculated pneumothorax and pneumomediastinum on 06/03/2022 on a routine CT.  Pigtail catheter placed.  Presented back on 11/28 with shortness of breath and chest x-ray showed a left spontaneous pneumothorax.  Readmitted and a chest tube was placed on the left side.  I removed the left-sided chest tube on 07/01/2022.  On 07/04/2022 presented back with worsening shortness of breath.  Chest x-ray showed recurrent left pneumothorax.  A new pigtail catheter was placed.  He underwent talc pleurodesis on that side.  While he was admitted we also did a bronchoscopy and IBV placement in the right middle lobe for his right-sided pneumothorax with a persistent air leak.  He went about 3 days without an air leak so his left-sided chest tube was removed.  He was discharged on 12/14.  He has not noted any air coming through the tubing on the right side since discharge.  His respiratory status has waxed and waned a little bit but no sudden chest pain or sudden shortness of breath.  Past Medical History:  Diagnosis Date   Allergy    Anemia    Anginal pain ([place])    Blood transfusion without reported diagnosis    CAD (coronary artery disease)    CAP (community acquired pneumonia) 09/2016   COPD (chronic obstructive pulmonary disease) (HCC)    Emphysema of lung (HCC)    GERD (gastroesophageal reflux disease)    Heart murmur    "I was told I had one when I was a kid"   Hemorrhoids    History of anal fissures    "no surgeries" (10/30/2016)   Hyperlipidemia    Hypertension    lung ca  dx'd 08/2018   with mets to bones in arms   Myocardial infarction [facility])    Peripheral arterial disease ([place])    status post right common iliac artery stenting back in 2007   Seasonal allergies    Tobacco abuse     Current Outpatient Medications  Medication Sig Dispense Refill   acetaminophen (TYLENOL) 500 MG tablet Take 1,000 mg by mouth every 6 (six) hours as needed for headache.     amLODipine (NORVASC) 5 MG tablet TAKE 1 TABLET BY MOUTH DAILY 180 tablet 0   aspirin EC 81 MG tablet Take 81 mg by mouth daily.     clopidogrel (PLAVIX) 75 MG tablet Take 1 tablet (75 mg total) by mouth daily.     esomeprazole (NEXIUM) 20 MG capsule Take 40 mg by mouth daily.     fexofenadine (ALLEGRA) 60 MG tablet Take 60 mg by mouth every evening.     Fluticasone-Umeclidin-Vilant (TRELEGY ELLIPTA) 100-62.5-25 MCG/ACT AEPB Inhale 1 puff into the lungs daily. 60 each 3   folic acid (FOLVITE) 1 MG tablet Take 1 tablet (1 mg total) by mouth daily.     Magnesium 500 MG TABS Take 1 tablet (500 mg total) by mouth in the morning and at bedtime.     metoprolol succinate (TOPROL-XL) 25 MG 24 hr tablet  TAKE 1 TABLET BY MOUTH DAILY 90 tablet 0   mirtazapine (REMERON) 15 MG tablet TAKE 1 TABLET(15 MG) BY MOUTH AT BEDTIME (Patient taking differently: Take 15 mg by mouth at bedtime.) 90 tablet 0   Multiple Vitamin (MULTIVITAMIN WITH MINERALS) TABS tablet Take 1 tablet by mouth daily.     oxyCODONE (OXY IR/ROXICODONE) 5 MG immediate release tablet Take 1 tablet (5 mg total) by mouth every 4 (four) hours as needed for moderate pain or severe pain. 30 tablet 0   oxyCODONE (OXY IR/ROXICODONE) 5 MG immediate release tablet Take 1 tablet (5 mg total) by mouth every 4 (four) hours as needed for moderate pain. 30 tablet 0   rosuvastatin (CRESTOR) 5 MG tablet TAKE 1 TABLET(5 MG) BY MOUTH EVERY OTHER DAY (Patient taking differently: Take 5 mg by mouth daily.) 45 tablet 0   No current facility-administered medications for this visit.    Facility-Administered Medications Ordered in Other Visits  Medication Dose Route Frequency Provider Last Rate Last Admin   diphenhydrAMINE (BENADRYL) 25 mg capsule             Physical Exam BP 109/79   Pulse 90   Resp 20   Wt 93 lb (42.2 kg)   SpO2 95%   BMI 14.23 kg/m  68 year old man in no acute distress No air leak from right lung Diminished breath sounds throughout most notable at left base  Diagnostic Tests: I personally reviewed the chest x-ray images which show a small basilar left pneumothorax.  No pneumothorax on right side.  IBV in place.  Impression: [place] "Sean Rose is a 67 year old man with severe COPD and multiple pneumothoraces.  Currently has a pigtail catheter in place on the right for a loculated pneumothorax with persistent air leak.  IV placed almost 3 weeks ago and really no evidence of air leak prior to discharge no air evidence of air leak today.  Pigtail catheter removed right chest without difficulty.  Left-sided pneumothorax-has a loculated basilar pneumothorax in the setting of previous talc pleurodesis.  Clinically stable.  Discussed options of admission for tube placement versus observation.  He is comfortable with observation for now.  He does know to call immediately and go to the emergency room if he has any worsening respiratory issues.  Plan: Right chest tube removal PA and lateral chest x-ray post tube removal Call and go to ED immediately if any worsening shortness of breath or chest discomfort Will do a repeat PA and lateral chest x-ray tomorrow to reevaluate the left pneumothorax Plan for IBV removal in about 4 to 6 weeks (6 to 8 weeks post implant)  Melrose Nakayama, MD [facility] [phone]

## 2022-07-31 ENCOUNTER — Ambulatory Visit (INDEPENDENT_AMBULATORY_CARE_PROVIDER_SITE_OTHER): Payer: BC Managed Care – PPO | Admitting: Thoracic Surgery (Cardiothoracic Vascular Surgery)

## 2022-07-31 ENCOUNTER — Ambulatory Visit
Admission: RE | Admit: 2022-07-31 | Discharge: 2022-07-31 | Disposition: A | Discharge status: Home / Self Care | Payer: BC Managed Care – PPO | Source: Ambulatory Visit | Attending: Thoracic Surgery (Cardiothoracic Vascular Surgery) | Admitting: Thoracic Surgery (Cardiothoracic Vascular Surgery)

## 2022-07-31 VITALS — BP 112/80 | HR 85 | Resp 20 | Wt 93.0 lb

## 2022-07-31 DIAGNOSIS — J930 Spontaneous tension pneumothorax: Secondary | ICD-10-CM | POA: Diagnosis not present

## 2022-07-31 DIAGNOSIS — J90 Pleural effusion, not elsewhere classified: Secondary | ICD-10-CM | POA: Diagnosis not present

## 2022-07-31 DIAGNOSIS — J939 Pneumothorax, unspecified: Secondary | ICD-10-CM

## 2022-07-31 NOTE — Progress Notes (Signed)
[address]       [place] [address]             [phone]     HPI: Mr. Aliberti returns for follow-up of his left pneumothorax.  Tyrez "Sean Rose" Batten is a 67 year old man with a history of tobacco abuse, severe COPD, stage IIIa non-small cell carcinoma, right upper lobectomy, hypertension, hyperlipidemia, CAD, PAD, reflux, heart murmur, and bilateral pneumothoraces.  He was incidentally noted to have a loculated pneumothorax and pneumomediastinum on CT done 06/03/2022.  Pigtail catheter was placed and he had a persistent air leak.  He presented back on 11/28 with shortness of breath and chest x-ray showed a left spontaneous pneumothorax.  Tube was placed on that side.  He had a recurrent left-sided pneumothorax on 07/04/2022.  Again a tube was placed for that.  He underwent pleurodesis during the hospitalization.  After 3 days with no airleak and no pneumothorax the tube was removed.  Although while he had a persistent air leak on the right side and underwent endobronchial valve placement on 07/10/2022.  He came to the office yesterday for follow-up.  His right chest tube had no air leak and was removed.  His chest x-ray however showed a basilar left-sided pneumothorax.  He was not terribly short of breath in relation to his baseline so he was allowed to go home with plan for repeat chest x-ray today.  He says that he feels his breathing is possibly slightly a little worse today.  No major changes.  Some of it he thinks is just due to anxiety about his chest x-ray result.  Past Medical History:  Diagnosis Date   Allergy    Anemia    Anginal pain ([place])    Blood transfusion without reported diagnosis    CAD (coronary artery disease)    CAP (community acquired pneumonia) 09/2016   COPD (chronic obstructive pulmonary disease) (HCC)    Emphysema of lung (HCC)    GERD (gastroesophageal reflux disease)    Heart murmur    "I was told I had one when I was a kid"    Hemorrhoids    History of anal fissures    "no surgeries" (10/30/2016)   Hyperlipidemia    Hypertension    lung ca dx'd 08/2018   with mets to bones in arms   Myocardial infarction [facility])    Peripheral arterial disease ([place])    status post right common iliac artery stenting back in 2007   Seasonal allergies    Tobacco abuse     Current Outpatient Medications  Medication Sig Dispense Refill   acetaminophen (TYLENOL) 500 MG tablet Take 1,000 mg by mouth every 6 (six) hours as needed for headache.     amLODipine (NORVASC) 5 MG tablet TAKE 1 TABLET BY MOUTH DAILY 180 tablet 0   aspirin EC 81 MG tablet Take 81 mg by mouth daily.     clopidogrel (PLAVIX) 75 MG tablet Take 1 tablet (75 mg total) by mouth daily.     esomeprazole (NEXIUM) 20 MG capsule Take 40 mg by mouth daily.     fexofenadine (ALLEGRA) 60 MG tablet Take 60 mg by mouth every evening.     Fluticasone-Umeclidin-Vilant (TRELEGY ELLIPTA) 100-62.5-25 MCG/ACT AEPB Inhale 1 puff into the lungs daily. 60 each 3   folic acid (FOLVITE) 1 MG tablet Take 1 tablet (1 mg total) by mouth daily.     Magnesium 500 MG TABS Take 1 tablet (500  mg total) by mouth in the morning and at bedtime.     metoprolol succinate (TOPROL-XL) 25 MG 24 hr tablet TAKE 1 TABLET BY MOUTH DAILY 90 tablet 0   mirtazapine (REMERON) 15 MG tablet TAKE 1 TABLET(15 MG) BY MOUTH AT BEDTIME (Patient taking differently: Take 15 mg by mouth at bedtime.) 90 tablet 0   Multiple Vitamin (MULTIVITAMIN WITH MINERALS) TABS tablet Take 1 tablet by mouth daily.     oxyCODONE (OXY IR/ROXICODONE) 5 MG immediate release tablet Take 1 tablet (5 mg total) by mouth every 4 (four) hours as needed for moderate pain. 30 tablet 0   rosuvastatin (CRESTOR) 5 MG tablet TAKE 1 TABLET(5 MG) BY MOUTH EVERY OTHER DAY (Patient taking differently: Take 5 mg by mouth daily.) 45 tablet 0   oxyCODONE (OXY IR/ROXICODONE) 5 MG immediate release tablet Take 1 tablet (5 mg total) by mouth every 4 (four) hours as  needed for moderate pain or severe pain. (Patient not taking: Reported on 07/31/2022) 30 tablet 0   No current facility-administered medications for this visit.   Facility-Administered Medications Ordered in Other Visits  Medication Dose Route Frequency Provider Last Rate Last Admin   diphenhydrAMINE (BENADRYL) 25 mg capsule             Physical Exam Frail-appearing 67 year old man in no acute distress Diminished breath sounds bilaterally but otherwise clear with no wheezing Trachea midline, no subcutaneous emphysema  Diagnostic Tests: CHEST - 2 VIEW   COMPARISON:  07/30/2022   FINDINGS: Port in the anterior chest wall with tip in distal SVC. Normal cardiac silhouette.   LEFT apical and sub pulmonic pneumothorax is unchanged in volume. Fluid level of the LEFT lung base.   Moderate volume volume RIGHT pleural effusion unchanged. No RIGHT pneumothorax.   IMPRESSION: 1. No significant interval change. 2. No change in LEFT hydropneumothorax. 3. Moderate RIGHT effusion. 4. No RIGHT pneumothorax.     Electronically Signed   By: Suzy Bouchard M.D.   On: 07/31/2022 15:02 I personally reviewed the chest x-ray images.  There is little if any change in the left pneumothorax.  Impression: Sean Rose is a 67 year old man with a history of tobacco abuse, severe COPD, stage IIIa non-small cell carcinoma, right upper lobectomy, hypertension, hyperlipidemia, CAD, PAD, reflux, heart murmur, and bilateral pneumothoraces.  Right pneumothorax resolved after prolonged tube drainage and endobronchial valve placement in the right middle lobe bronchi.  Tube removed yesterday with no issues.  Left spontaneous pneumothorax-has had recurrent pneumothoraces.  Has undergone pleurodesis on 1 occasion.  Chest x-ray today shows a stable left pneumothorax primarily basilar.  Remains only mildly symptomatic.  He has pretty significant dyspnea at rest.  Discussed options hospitalization for tube  placement and reasonably pleurodesis versus continued conservative management.  He understands the risks and benefits of each approach.  Wishes to continue with conservative management.  Plan: Will plan to see back in 2 days with repeat PA and lateral chest x-ray He knows to call immediately and go to ED if he has any worsening of his respiratory status  Melrose Nakayama, MD [facility] [phone]

## 2022-08-02 ENCOUNTER — Encounter: Payer: Self-pay | Admitting: Thoracic Surgery (Cardiothoracic Vascular Surgery)

## 2022-08-02 ENCOUNTER — Ambulatory Visit (INDEPENDENT_AMBULATORY_CARE_PROVIDER_SITE_OTHER): Payer: BC Managed Care – PPO | Admitting: Thoracic Surgery (Cardiothoracic Vascular Surgery)

## 2022-08-02 ENCOUNTER — Ambulatory Visit
Admission: RE | Admit: 2022-08-02 | Discharge: 2022-08-02 | Disposition: A | Discharge status: Home / Self Care | Payer: BC Managed Care – PPO | Source: Ambulatory Visit | Attending: Thoracic Surgery (Cardiothoracic Vascular Surgery) | Admitting: Thoracic Surgery (Cardiothoracic Vascular Surgery)

## 2022-08-02 VITALS — BP 111/58 | HR 88 | Resp 20 | Wt 100.0 lb

## 2022-08-02 DIAGNOSIS — Z4682 Encounter for fitting and adjustment of non-vascular catheter: Secondary | ICD-10-CM

## 2022-08-02 DIAGNOSIS — J90 Pleural effusion, not elsewhere classified: Secondary | ICD-10-CM | POA: Diagnosis not present

## 2022-08-02 DIAGNOSIS — J9383 Other pneumothorax: Secondary | ICD-10-CM | POA: Diagnosis not present

## 2022-08-02 DIAGNOSIS — J939 Pneumothorax, unspecified: Secondary | ICD-10-CM | POA: Diagnosis not present

## 2022-08-02 DIAGNOSIS — J439 Emphysema, unspecified: Secondary | ICD-10-CM | POA: Diagnosis not present

## 2022-08-02 NOTE — Progress Notes (Signed)
      [address]       [place] [address]             [phone]   Mr. Sean Rose returns for follow-up of his pneumothorax.  Sean Rose is a 67 year old man with a history of tobacco abuse, COPD, stage IIIa lung cancer, right upper lobectomy, and bilateral pneumothoraces.  He had a loculated right pneumothorax and pneumomediastinum treated with pigtail drainage and ultimately endobronchial valve placement in the right middle lobe bronchi on 07/10/2022.  Also had a recurrent left pneumothorax treated with chest tube and talc pleurodesis.  He presented back 07/30/2022 for removal of his chest drain.  He was incidentally noted to have a basilar left-sided pneumothorax.  There also was a very small apical component.  He was clinically stable.  I did remove the drain from the right.  I saw him back on 07/31/2022.  The pneumothorax was stable and symptomatically he was stable.  Over the past couple of days he has felt about the same.  No acute worsening of his respiratory status.  BP (!) 111/58 (BP Location: Right Arm, Patient Position: Sitting, Cuff Size: Small)   Pulse 88   Resp 20   Wt 100 lb (45.4 kg)   SpO2 97% Comment: RA  BMI 15.20 kg/m  Exam unchanged.  CHEST - 2 VIEW   COMPARISON:  07/31/2022   FINDINGS: Right chest wall port a catheter noted with tip in the superior cavoatrial junction. Heart size is normal. Left apical pneumothorax is unchanged in volume from the previous exam measuring 2.1 cm in thickness over the apex. The left basilar hydropneumothorax is also unchanged compared with the previous exam. Moderate right pleural effusion, stable. No right-sided pneumothorax. Diffuse interstitial changes of COPD/emphysema.   IMPRESSION: 1. No change in left apical pneumothorax and left basilar hydropneumothorax. 2. Stable moderate right pleural effusion. 3. COPD/emphysema.     Electronically Signed   By: Kerby Moors M.D.   On: 08/02/2022  14:59   Impression:  Stable left pneumothorax.   Chronic dyspnea at baseline. He prefers not to have any intervention unless absolutely necessary.  I will plan to see him back in about 2 weeks with a PA and lateral chest x-ray He knows to call if he has any worsening of his respiratory status in the meantime.  Revonda Standard Roxan Hockey, MD [facility] [phone]

## 2022-08-12 ENCOUNTER — Telehealth: Payer: Self-pay | Admitting: Adult Health

## 2022-08-12 NOTE — Telephone Encounter (Signed)
Handicap Placard form to be filled out--placed in dr's folder.  Please call [phone] upon completion.

## 2022-08-13 NOTE — Telephone Encounter (Signed)
Noted! Form placed on providers desk

## 2022-08-14 NOTE — Telephone Encounter (Signed)
Called pt spouse [name] and advised that form is ready for pick up. Copy has been placed in scans.

## 2022-08-19 ENCOUNTER — Ambulatory Visit (INDEPENDENT_AMBULATORY_CARE_PROVIDER_SITE_OTHER): Payer: BC Managed Care – PPO | Admitting: Thoracic Surgery (Cardiothoracic Vascular Surgery)

## 2022-08-19 ENCOUNTER — Ambulatory Visit
Admission: RE | Admit: 2022-08-19 | Discharge: 2022-08-19 | Disposition: A | Discharge status: Home / Self Care | Payer: BC Managed Care – PPO | Source: Ambulatory Visit | Attending: Thoracic Surgery (Cardiothoracic Vascular Surgery) | Admitting: Thoracic Surgery (Cardiothoracic Vascular Surgery)

## 2022-08-19 ENCOUNTER — Encounter: Payer: Self-pay | Admitting: Thoracic Surgery (Cardiothoracic Vascular Surgery)

## 2022-08-19 VITALS — BP 116/81 | HR 72 | Resp 18 | Ht 68.0 in

## 2022-08-19 DIAGNOSIS — J90 Pleural effusion, not elsewhere classified: Secondary | ICD-10-CM | POA: Diagnosis not present

## 2022-08-19 DIAGNOSIS — J948 Other specified pleural conditions: Secondary | ICD-10-CM | POA: Diagnosis not present

## 2022-08-19 DIAGNOSIS — J939 Pneumothorax, unspecified: Secondary | ICD-10-CM | POA: Diagnosis not present

## 2022-08-19 DIAGNOSIS — J930 Spontaneous tension pneumothorax: Secondary | ICD-10-CM

## 2022-08-19 DIAGNOSIS — J9383 Other pneumothorax: Secondary | ICD-10-CM

## 2022-08-19 DIAGNOSIS — J439 Emphysema, unspecified: Secondary | ICD-10-CM | POA: Diagnosis not present

## 2022-08-19 NOTE — Progress Notes (Signed)
[address]       [name] [address]             [phone]     HPI: Mr. Man returns for a follow-up regarding bilateral pneumothoraces.  Sean "Sean Rose" List is a 67 year old man with a history of tobacco abuse, COPD, stage IIIa lung cancer, right upper lobectomy, and bilateral pneumothoraces.  He had a loculated right pneumothorax and pneumomediastinum treated with pigtail drainage and ultimately endobronchial valve placement in the right middle lobe bronchi on 07/10/2022.  Also had a recurrent left pneumothorax treated with chest tube and talc pleurodesis.   He presented back 07/30/2022 for removal of his chest drain.  He was incidentally noted to have a basilar left-sided pneumothorax.  There also was a very small apical component.  He was clinically stable.  I did remove the drain from the right.   I saw him back on 07/31/2022 and again on 08/02/2022.  The pneumothorax was stable and symptomatically he was stable.  He remains chronically dyspneic.  No acute changes since his last visit.  Gets short of breath with minimal activity.  Past Medical History:  Diagnosis Date   Allergy    Anemia    Anginal pain (HCC)    Blood transfusion without reported diagnosis    CAD (coronary artery disease)    CAP (community acquired pneumonia) 09/2016   COPD (chronic obstructive pulmonary disease) (HCC)    Emphysema of lung (HCC)    GERD (gastroesophageal reflux disease)    Heart murmur    "I was told I had one when I was a kid"   Hemorrhoids    History of anal fissures    "no surgeries" (10/30/2016)   Hyperlipidemia    Hypertension    lung ca dx'd 08/2018   with mets to bones in arms   Myocardial infarction [facility])    Peripheral arterial disease (HCC)    status post right common iliac artery stenting back in 2007   Seasonal allergies    Tobacco abuse     Current Outpatient Medications  Medication Sig Dispense Refill   acetaminophen (TYLENOL) 500 MG tablet Take 1,000  mg by mouth every 6 (six) hours as needed for headache.     amLODipine (NORVASC) 5 MG tablet TAKE 1 TABLET BY MOUTH DAILY 180 tablet 0   aspirin EC 81 MG tablet Take 81 mg by mouth daily.     clopidogrel (PLAVIX) 75 MG tablet Take 1 tablet (75 mg total) by mouth daily.     esomeprazole (NEXIUM) 20 MG capsule Take 40 mg by mouth daily.     fexofenadine (ALLEGRA) 60 MG tablet Take 60 mg by mouth every evening.     Fluticasone-Umeclidin-Vilant (TRELEGY ELLIPTA) 100-62.5-25 MCG/ACT AEPB Inhale 1 puff into the lungs daily. 60 each 3   folic acid (FOLVITE) 1 MG tablet Take 1 tablet (1 mg total) by mouth daily.     Magnesium 500 MG TABS Take 1 tablet (500 mg total) by mouth in the morning and at bedtime.     metoprolol succinate (TOPROL-XL) 25 MG 24 hr tablet TAKE 1 TABLET BY MOUTH DAILY 90 tablet 0   mirtazapine (REMERON) 15 MG tablet TAKE 1 TABLET(15 MG) BY MOUTH AT BEDTIME (Patient taking differently: Take 15 mg by mouth at bedtime.) 90 tablet 0   Multiple Vitamin (MULTIVITAMIN WITH MINERALS) TABS tablet Take 1 tablet by mouth daily.     oxyCODONE (OXY IR/ROXICODONE) 5 MG immediate  release tablet Take 1 tablet (5 mg total) by mouth every 4 (four) hours as needed for moderate pain or severe pain. 30 tablet 0   oxyCODONE (OXY IR/ROXICODONE) 5 MG immediate release tablet Take 1 tablet (5 mg total) by mouth every 4 (four) hours as needed for moderate pain. 30 tablet 0   rosuvastatin (CRESTOR) 5 MG tablet TAKE 1 TABLET(5 MG) BY MOUTH EVERY OTHER DAY (Patient taking differently: Take 5 mg by mouth daily.) 45 tablet 0   No current facility-administered medications for this visit.   Facility-Administered Medications Ordered in Other Visits  Medication Dose Route Frequency Provider Last Rate Last Admin   diphenhydrAMINE (BENADRYL) 25 mg capsule             Physical Exam BP 116/81 (BP Location: Left Arm, Patient Position: Sitting)   Pulse 72   Resp 18   Ht 5\' 8"  (1.727 m)   SpO2 96% Comment: RA  BMI  15.89 kg/m  66 year old man in no acute distress Cachectic Alert and oriented x 3 with no focal deficits Lungs diminished bilaterally especially left base, no wheezing  Diagnostic Tests: I personally reviewed the chest x-ray images.  Possible slightly larger basilar and apical components of the left pneumothorax but relatively little change over 2-week.  Right lung stable.  Impression: Sean "Sean Rose" Rose is a 67 year old man with history of tobacco abuse, severe COPD, stage IIIa lung cancer, right upper lobectomy, and bilateral pneumothoraces.  He had a loculated pneumothorax with pneumomediastinum on the right which required tube placement and then IBV for a prolonged air leak.  He then developed a spontaneous left pneumothorax which was treated with a chest tube.  He underwent talc pleurodesis but on follow-up had a basilar pneumothorax as well as a small apical component.  Right pneumothorax-prolonged air leak, IBV placed 07/10/2022.  Coming up on 6-week point so we will plan to take those valves out in early February.  I informed him of the general nature of the procedure which is a bronchoscopy under general anesthesia.  Will plan to do it as an outpatient.  He is aware of the indications, risk, benefits, and alternatives.  Left basilar and apical pneumothoraces-stable to slightly increased in size over a 2-week period.  Symptoms relatively stable.  Not sure how much benefit he would get from the chest tube and he really does not want to have any procedures if he can avoid it.  Some concern that the lung may be trapped to a degree.  For now we will follow-up with another x-ray prior to his bronchoscopy.  He knows to seek medical attention immediately if his symptoms were to worsen.  Plan: Bronchoscopy for IBV removal on 09/04/2021  11/02/2021, MD [facility] [phone]

## 2022-08-19 NOTE — H&P (View-Only) (Signed)
[address]       [place] [address]             [phone]     HPI: Mr. Sean Rose returns for a follow-up regarding bilateral pneumothoraces.  Sean Rose is a 67 year old man with a history of tobacco abuse, COPD, stage IIIa lung cancer, right upper lobectomy, and bilateral pneumothoraces.  He had a loculated right pneumothorax and pneumomediastinum treated with pigtail drainage and ultimately endobronchial valve placement in the right middle lobe bronchi on 07/10/2022.  Also had a recurrent left pneumothorax treated with chest tube and talc pleurodesis.   He presented back 07/30/2022 for removal of his chest drain.  He was incidentally noted to have a basilar left-sided pneumothorax.  There also was a very small apical component.  He was clinically stable.  I did remove the drain from the right.   I saw him back on 07/31/2022 and again on 08/02/2022.  The pneumothorax was stable and symptomatically he was stable.  He remains chronically dyspneic.  No acute changes since his last visit.  Gets short of breath with minimal activity.  Past Medical History:  Diagnosis Date   Allergy    Anemia    Anginal pain (HCC)    Blood transfusion without reported diagnosis    CAD (coronary artery disease)    CAP (community acquired pneumonia) 09/2016   COPD (chronic obstructive pulmonary disease) (HCC)    Emphysema of lung (HCC)    GERD (gastroesophageal reflux disease)    Heart murmur    "I was told I had one when I was a kid"   Hemorrhoids    History of anal fissures    "no surgeries" (10/30/2016)   Hyperlipidemia    Hypertension    lung ca dx'd 08/2018   with mets to bones in arms   Myocardial infarction [facility])    Peripheral arterial disease (HCC)    status post right common iliac artery stenting back in 2007   Seasonal allergies    Tobacco abuse     Current Outpatient Medications  Medication Sig Dispense Refill   acetaminophen (TYLENOL) 500 MG tablet Take 1,000  mg by mouth every 6 (six) hours as needed for headache.     amLODipine (NORVASC) 5 MG tablet TAKE 1 TABLET BY MOUTH DAILY 180 tablet 0   aspirin EC 81 MG tablet Take 81 mg by mouth daily.     clopidogrel (PLAVIX) 75 MG tablet Take 1 tablet (75 mg total) by mouth daily.     esomeprazole (NEXIUM) 20 MG capsule Take 40 mg by mouth daily.     fexofenadine (ALLEGRA) 60 MG tablet Take 60 mg by mouth every evening.     Fluticasone-Umeclidin-Vilant (TRELEGY ELLIPTA) 100-62.5-25 MCG/ACT AEPB Inhale 1 puff into the lungs daily. 60 each 3   folic acid (FOLVITE) 1 MG tablet Take 1 tablet (1 mg total) by mouth daily.     Magnesium 500 MG TABS Take 1 tablet (500 mg total) by mouth in the morning and at bedtime.     metoprolol succinate (TOPROL-XL) 25 MG 24 hr tablet TAKE 1 TABLET BY MOUTH DAILY 90 tablet 0   mirtazapine (REMERON) 15 MG tablet TAKE 1 TABLET(15 MG) BY MOUTH AT BEDTIME (Patient taking differently: Take 15 mg by mouth at bedtime.) 90 tablet 0   Multiple Vitamin (MULTIVITAMIN WITH MINERALS) TABS tablet Take 1 tablet by mouth daily.     oxyCODONE (OXY IR/ROXICODONE) 5 MG immediate  release tablet Take 1 tablet (5 mg total) by mouth every 4 (four) hours as needed for moderate pain or severe pain. 30 tablet 0   oxyCODONE (OXY IR/ROXICODONE) 5 MG immediate release tablet Take 1 tablet (5 mg total) by mouth every 4 (four) hours as needed for moderate pain. 30 tablet 0   rosuvastatin (CRESTOR) 5 MG tablet TAKE 1 TABLET(5 MG) BY MOUTH EVERY OTHER DAY (Patient taking differently: Take 5 mg by mouth daily.) 45 tablet 0   No current facility-administered medications for this visit.   Facility-Administered Medications Ordered in Other Visits  Medication Dose Route Frequency Provider Last Rate Last Admin   diphenhydrAMINE (BENADRYL) 25 mg capsule             Physical Exam BP 116/81 (BP Location: Left Arm, Patient Position: Sitting)   Pulse 72   Resp 18   Ht 5\' 8"  (1.727 m)   SpO2 96% Comment: RA  BMI  15.108 kg/m  67 year old man in no acute distress Cachectic Alert and oriented x 3 with no focal deficits Lungs diminished bilaterally especially left base, no wheezing  Diagnostic Tests: I personally reviewed the chest x-ray images.  Possible slightly larger basilar and apical components of the left pneumothorax but relatively little change over 2-week.  Right lung stable.  Impression: Sean Rose is a 67 year old man with history of tobacco abuse, severe COPD, stage IIIa lung cancer, right upper lobectomy, and bilateral pneumothoraces.  He had a loculated pneumothorax with pneumomediastinum on the right which required tube placement and then IBV for a prolonged air leak.  He then developed a spontaneous left pneumothorax which was treated with a chest tube.  He underwent talc pleurodesis but on follow-up had a basilar pneumothorax as well as a small apical component.  Right pneumothorax-prolonged air leak, IBV placed 07/10/2022.  Coming up on 6-week point so we will plan to take those valves out in early February.  I informed him of the general nature of the procedure which is a bronchoscopy under general anesthesia.  Will plan to do it as an outpatient.  He is aware of the indications, risk, benefits, and alternatives.  Left basilar and apical pneumothoraces-stable to slightly increased in size over a 2-week period.  Symptoms relatively stable.  Not sure how much benefit he would get from the chest tube and he really does not want to have any procedures if he can avoid it.  Some concern that the lung may be trapped to a degree.  For now we will follow-up with another x-ray prior to his bronchoscopy.  He knows to seek medical attention immediately if his symptoms were to worsen.  Plan: Bronchoscopy for IBV removal on 09/04/2021  11/02/2021, MD [facility] [phone]

## 2022-08-20 ENCOUNTER — Other Ambulatory Visit: Payer: Self-pay | Admitting: *Deleted

## 2022-08-20 DIAGNOSIS — J9382 Other air leak: Secondary | ICD-10-CM

## 2022-08-20 DIAGNOSIS — J9383 Other pneumothorax: Secondary | ICD-10-CM

## 2022-08-23 ENCOUNTER — Other Ambulatory Visit: Payer: Self-pay | Admitting: Cardiovascular Disease

## 2022-09-02 ENCOUNTER — Encounter (HOSPITAL_COMMUNITY): Payer: Self-pay | Admitting: Thoracic Surgery (Cardiothoracic Vascular Surgery)

## 2022-09-02 NOTE — Progress Notes (Signed)
PCP - [name], NP Cardiologist - Dr Nanetta Batty  Chest x-ray - DOS EKG - 07/04/22 Stress Test - 05/14/19 ECHO - 05/05/19 Cardiac Cath - 09/23/08  ICD Pacemaker/Loop - n/a  Sleep Study -  n/a CPAP - none  Diabetes - n/a  ASA & Blood Thinner Instructions:  Last dose of Plavix was on 08/29/22.  Wife will call MD's office for ASA instructions.  Anesthesia review: Yes  STOP now taking any Aspirin (unless otherwise instructed by your surgeon), Aleve, Naproxen, Ibuprofen, Motrin, Advil, Goody's, BC's, all herbal medications, fish oil, and all vitamins.   Coronavirus Screening Does the patient have any of the following symptoms:  Cough Yes-occasionally Fever (>100.8F)  yes/no: No Runny nose yes/no: No Sore throat yes/no: No Difficulty breathing/shortness of breath  Yes  Has the patient traveled in the last 14 days and where? yes/no: No  Wife [name] verbalized understanding of instructions that were given via phone.

## 2022-09-03 ENCOUNTER — Encounter (HOSPITAL_COMMUNITY): Payer: Self-pay | Admitting: Thoracic Surgery (Cardiothoracic Vascular Surgery)

## 2022-09-03 NOTE — Progress Notes (Signed)
Anesthesia Chart Review: Same day workup  67 year old male with a history of tobacco abuse, COPD, chronic anemia, CAD (s/p BMS to RCA with re-dilation 2015 if ISR), PVD (Right common iliac artery stent 2007, Left common iliac artery stent 2016), HTN, chronic dyspnea, stage IIIa lung cancer, right upper lobectomy 08/2018, recurrent pneumothoraces.  Nuclear stress 04/2019 was low risk.  Echo 04/2019 showed EF 60 to 65%, mild tricuspid regurgitation  He recently had a loculated right pneumothorax and pneumomediastinum treated with pigtail drainage and ultimately endobronchial valve placement in the right middle lobe bronchi on 07/10/2022.  Also had a recurrent left pneumothorax treated with chest tube and talc pleurodesis.  Last seen by [name] on 08/19/2022.  Per note, "Left basilar and apical pneumothoraces-stable to slightly increased in size over a 2-week period.  Symptoms relatively stable.  Not sure how much benefit he would get from the chest tube and he really does not want to have any procedures if he can avoid it.  Some concern that the lung may be trapped to a degree.  For now we will follow-up with another x-ray prior to his bronchoscopy.  He knows to seek medical attention immediately if his symptoms were to worsen."  Patient reports last dose Plavix 08/29/2022.  Patient will need day of surgery labs and evaluation.  EKG 07/04/2022: Sinus rhythm.  Rate 83.  Abnormal R wave progression, early transition.  Borderline prolonged QT (QTcB 496)  Nuclear stress 05/14/2019: There was no ST segment deviation noted during stress. The left ventricular ejection fraction is normal (55-65%). Nuclear stress EF: 58%. Defect 1: There is a medium fixed defect of mild severity present in the basal inferoseptal, mid inferoseptal, apical septal and apex location. Given normal wall motion in this region, this is consistent with artifact The study is normal. This is a low risk study.  TTE 05/05/2019:  1.  Left ventricular ejection fraction, by visual estimation, is 60 to  65%. The left ventricle has normal function. There is mildly increased  left ventricular hypertrophy.   2. Left ventricular diastolic Doppler parameters are consistent with  pseudonormalization pattern of LV diastolic filling.   3. Global right ventricle has normal systolic function.The right  ventricular size is normal. No increase in right ventricular wall  thickness.   4. Left atrial size was normal.   5. Right atrial size was mildly dilated.   6. The mitral valve is normal in structure. No evidence of mitral valve  regurgitation. No evidence of mitral stenosis.   7. The tricuspid valve is grossly normal. Tricuspid valve regurgitation  is mild.   8. The aortic valve is normal in structure. Aortic valve regurgitation  was not visualized by color flow Doppler. Structurally normal aortic  valve, with no evidence of sclerosis or stenosis.   9. The pulmonic valve was grossly normal. Pulmonic valve regurgitation is  not visualized by color flow Doppler.  10. Moderately elevated pulmonary artery systolic pressure.  11. The atrial septum is grossly normal.     Wynonia Musty [facility] Short Stay [facility]/Anesthesiology Phone [phone] 09/03/2022 11:48 AM

## 2022-09-03 NOTE — Anesthesia Preprocedure Evaluation (Addendum)
Anesthesia Evaluation  Patient identified by MRN, date of birth, ID band Patient awake    Reviewed: Allergy & Precautions, NPO status , Patient's Chart, lab work & pertinent test results, reviewed documented beta blocker date and time   Airway Mallampati: II  TM Distance: >3 FB Neck ROM: Full    Dental  (+) Dental Advisory Given, Caps, Missing, Poor Dentition, Loose,    Pulmonary shortness of breath, COPD, former smoker Lung cancer s/p Right VATS with Right Upper Lobectomy Right pneumothorax-prolonged air leak, IBV placed 07/10/2022 Left basilar and apical pneumothoraces   + rhonchi (L)  + decreased breath sounds      Cardiovascular hypertension, Pt. on home beta blockers and Pt. on medications + angina  + CAD, + Past MI, + Cardiac Stents (RCA) and + Peripheral Vascular Disease (status post right common iliac artery stenting-2007)  Normal cardiovascular exam Rhythm:Regular Rate:Normal     Neuro/Psych negative neurological ROS     GI/Hepatic Neg liver ROS,GERD  Medicated,,  Endo/Other  negative endocrine ROS    Renal/GU negative Renal ROS     Musculoskeletal negative musculoskeletal ROS (+)    Abdominal   Peds  Hematology  (+) Blood dyscrasia (Plavix), anemia   Anesthesia Other Findings Day of surgery medications reviewed with the patient.  Reproductive/Obstetrics                              Anesthesia Physical Anesthesia Plan  ASA: 3  Anesthesia Plan: General   Post-op Pain Management: Tylenol PO (pre-op)*   Induction: Intravenous  PONV Risk Score and Plan: 4 or greater and Midazolam, Dexamethasone and Ondansetron  Airway Management Planned: Oral ETT  Additional Equipment:   Intra-op Plan:   Post-operative Plan: Extubation in OR  Informed Consent: I have reviewed the patients History and Physical, chart, labs and discussed the procedure including the risks, benefits and  alternatives for the proposed anesthesia with the patient or authorized representative who has indicated his/her understanding and acceptance.     Dental advisory given  Plan Discussed with: CRNA  Anesthesia Plan Comments: (PAT note by Karoline Caldwell, PA-C: 67 year old male with a history of tobacco abuse, COPD, chronic anemia, CAD (s/p BMS to RCA with re-dilation 2015 if ISR), PVD (Right common iliac artery stent 2007, Left common iliac artery stent 2016), HTN, chronic dyspnea, stage IIIa lung cancer, right upper lobectomy 08/2018, recurrent pneumothoraces.  Nuclear stress 04/2019 was low risk.  Echo 04/2019 showed EF 60 to 65%, mild tricuspid regurgitation  He recently had a loculated right pneumothorax and pneumomediastinum treated with pigtail drainage and ultimately endobronchial valve placement in the right middle lobe bronchi on 07/10/2022.  Also had a recurrent left pneumothorax treated with chest tube and talc pleurodesis.  Last seen by [name] on 08/19/2022.  Per note, "Left basilar and apical pneumothoraces-stable to slightly increased in size over a 2-week period.  Symptoms relatively stable.  Not sure how much benefit he would get from the chest tube and he really does not want to have any procedures if he can avoid it.  Some concern that the lung may be trapped to a degree.  For now we will follow-up with another x-ray prior to his bronchoscopy.  He knows to seek medical attention immediately if his symptoms were to worsen."  Patient reports last dose Plavix 08/29/2022.  Patient will need day of surgery labs and evaluation.  EKG 07/04/2022: Sinus rhythm.  Rate 83.  Abnormal R wave progression, early transition.  Borderline prolonged QT (QTcB 496)  Nuclear stress 05/14/2019: ? There was no ST segment deviation noted during stress. ? The left ventricular ejection fraction is normal (55-65%). ? Nuclear stress EF: 58%. ? Defect 1: There is a medium fixed defect of mild severity  present in the basal inferoseptal, mid inferoseptal, apical septal and apex location. Given normal wall motion in this region, this is consistent with artifact ? The study is normal. ? This is a low risk study.  TTE 05/05/2019:  1. Left ventricular ejection fraction, by visual estimation, is 60 to  65%. The left ventricle has normal function. There is mildly increased  left ventricular hypertrophy.   2. Left ventricular diastolic Doppler parameters are consistent with  pseudonormalization pattern of LV diastolic filling.   3. Global right ventricle has normal systolic function.The right  ventricular size is normal. No increase in right ventricular wall  thickness.   4. Left atrial size was normal.   5. Right atrial size was mildly dilated.   6. The mitral valve is normal in structure. No evidence of mitral valve  regurgitation. No evidence of mitral stenosis.   7. The tricuspid valve is grossly normal. Tricuspid valve regurgitation  is mild.   8. The aortic valve is normal in structure. Aortic valve regurgitation  was not visualized by color flow Doppler. Structurally normal aortic  valve, with no evidence of sclerosis or stenosis.   9. The pulmonic valve was grossly normal. Pulmonic valve regurgitation is  not visualized by color flow Doppler.  10. Moderately elevated pulmonary artery systolic pressure.  11. The atrial septum is grossly normal.      )         Anesthesia Quick Evaluation

## 2022-09-06 ENCOUNTER — Ambulatory Visit (HOSPITAL_COMMUNITY): Payer: BC Managed Care – PPO | Admitting: Physician Assistant

## 2022-09-06 ENCOUNTER — Ambulatory Visit (HOSPITAL_COMMUNITY)
Admission: RE | Admit: 2022-09-06 | Discharge: 2022-09-06 | Disposition: A | Discharge status: Home / Self Care | Payer: BC Managed Care – PPO | Attending: Thoracic Surgery (Cardiothoracic Vascular Surgery) | Admitting: Thoracic Surgery (Cardiothoracic Vascular Surgery)

## 2022-09-06 ENCOUNTER — Encounter (HOSPITAL_COMMUNITY)
Admission: RE | Disposition: A | Discharge status: Home / Self Care | Payer: Self-pay | Source: Home / Self Care | Attending: Thoracic Surgery (Cardiothoracic Vascular Surgery)

## 2022-09-06 ENCOUNTER — Ambulatory Visit (HOSPITAL_COMMUNITY): Payer: BC Managed Care – PPO

## 2022-09-06 DIAGNOSIS — I10 Essential (primary) hypertension: Secondary | ICD-10-CM | POA: Insufficient documentation

## 2022-09-06 DIAGNOSIS — J948 Other specified pleural conditions: Secondary | ICD-10-CM | POA: Insufficient documentation

## 2022-09-06 DIAGNOSIS — K219 Gastro-esophageal reflux disease without esophagitis: Secondary | ICD-10-CM | POA: Diagnosis not present

## 2022-09-06 DIAGNOSIS — J449 Chronic obstructive pulmonary disease, unspecified: Secondary | ICD-10-CM | POA: Diagnosis not present

## 2022-09-06 DIAGNOSIS — Z87891 Personal history of nicotine dependence: Secondary | ICD-10-CM | POA: Diagnosis not present

## 2022-09-06 DIAGNOSIS — J9382 Other air leak: Secondary | ICD-10-CM | POA: Insufficient documentation

## 2022-09-06 DIAGNOSIS — Z4589 Encounter for adjustment and management of other implanted devices: Secondary | ICD-10-CM | POA: Insufficient documentation

## 2022-09-06 DIAGNOSIS — I2510 Atherosclerotic heart disease of native coronary artery without angina pectoris: Secondary | ICD-10-CM | POA: Insufficient documentation

## 2022-09-06 DIAGNOSIS — Z1152 Encounter for screening for COVID-19: Secondary | ICD-10-CM | POA: Insufficient documentation

## 2022-09-06 DIAGNOSIS — I25119 Atherosclerotic heart disease of native coronary artery with unspecified angina pectoris: Secondary | ICD-10-CM | POA: Diagnosis not present

## 2022-09-06 DIAGNOSIS — Z85118 Personal history of other malignant neoplasm of bronchus and lung: Secondary | ICD-10-CM | POA: Diagnosis not present

## 2022-09-06 DIAGNOSIS — I252 Old myocardial infarction: Secondary | ICD-10-CM | POA: Diagnosis not present

## 2022-09-06 DIAGNOSIS — J982 Interstitial emphysema: Secondary | ICD-10-CM | POA: Diagnosis not present

## 2022-09-06 DIAGNOSIS — I739 Peripheral vascular disease, unspecified: Secondary | ICD-10-CM | POA: Diagnosis not present

## 2022-09-06 DIAGNOSIS — Z09 Encounter for follow-up examination after completed treatment for conditions other than malignant neoplasm: Secondary | ICD-10-CM | POA: Insufficient documentation

## 2022-09-06 DIAGNOSIS — J90 Pleural effusion, not elsewhere classified: Secondary | ICD-10-CM | POA: Insufficient documentation

## 2022-09-06 DIAGNOSIS — J939 Pneumothorax, unspecified: Secondary | ICD-10-CM | POA: Diagnosis not present

## 2022-09-06 DIAGNOSIS — Z08 Encounter for follow-up examination after completed treatment for malignant neoplasm: Secondary | ICD-10-CM | POA: Insufficient documentation

## 2022-09-06 DIAGNOSIS — Z01818 Encounter for other preprocedural examination: Secondary | ICD-10-CM | POA: Diagnosis not present

## 2022-09-06 DIAGNOSIS — Z7902 Long term (current) use of antithrombotics/antiplatelets: Secondary | ICD-10-CM | POA: Diagnosis not present

## 2022-09-06 DIAGNOSIS — J439 Emphysema, unspecified: Secondary | ICD-10-CM | POA: Diagnosis not present

## 2022-09-06 HISTORY — DX: Dyspnea, unspecified: R06.00

## 2022-09-06 HISTORY — PX: VIDEO BRONCHOSCOPY WITH INSERTION OF INTERBRONCHIAL VALVE (IBV): SHX6178

## 2022-09-06 LAB — CBC
HCT: 32.1 % — ABNORMAL LOW (ref 39.0–52.0)
Hemoglobin: 10.3 g/dL — ABNORMAL LOW (ref 13.0–17.0)
MCH: 30.7 pg (ref 26.0–34.0)
MCHC: 32.1 g/dL (ref 30.0–36.0)
MCV: 95.5 fL (ref 80.0–100.0)
Platelets: 572 10*3/uL — ABNORMAL HIGH (ref 150–400)
RBC: 3.36 MIL/uL — ABNORMAL LOW (ref 4.22–5.81)
RDW: 15.9 % — ABNORMAL HIGH (ref 11.5–15.5)
WBC: 9.7 10*3/uL (ref 4.0–10.5)
nRBC: 0 % (ref 0.0–0.2)

## 2022-09-06 LAB — SARS CORONAVIRUS 2 BY RT PCR: SARS Coronavirus 2 by RT PCR: NEGATIVE

## 2022-09-06 LAB — PROTIME-INR
INR: 1.1 (ref 0.8–1.2)
Prothrombin Time: 13.7 seconds (ref 11.4–15.2)

## 2022-09-06 LAB — COMPREHENSIVE METABOLIC PANEL
ALT: 14 U/L (ref 0–44)
AST: 30 U/L (ref 15–41)
Albumin: 3.3 g/dL — ABNORMAL LOW (ref 3.5–5.0)
Alkaline Phosphatase: 76 U/L (ref 38–126)
Anion gap: 14 (ref 5–15)
BUN: 31 mg/dL — ABNORMAL HIGH (ref 8–23)
CO2: 19 mmol/L — ABNORMAL LOW (ref 22–32)
Calcium: 9.4 mg/dL (ref 8.9–10.3)
Chloride: 96 mmol/L — ABNORMAL LOW (ref 98–111)
Creatinine, Ser: 1.6 mg/dL — ABNORMAL HIGH (ref 0.61–1.24)
GFR, Estimated: 47 mL/min — ABNORMAL LOW (ref >60)
Glucose, Bld: 98 mg/dL (ref 70–99)
Potassium: 3.9 mmol/L (ref 3.5–5.1)
Sodium: 129 mmol/L — ABNORMAL LOW (ref 135–145)
Total Bilirubin: 0.7 mg/dL (ref 0.3–1.2)
Total Protein: 7.5 g/dL (ref 6.5–8.1)

## 2022-09-06 LAB — APTT: aPTT: 36 seconds (ref 24–36)

## 2022-09-06 SURGERY — BRONCHOSCOPY, FLEXIBLE, WITH INTRABRONCHIAL VALVE INSERTION
Anesthesia: General

## 2022-09-06 MED ORDER — FENTANYL CITRATE (PF) 100 MCG/2ML IJ SOLN: 25.0000 ug | INTRAMUSCULAR | Status: DC | PRN

## 2022-09-06 MED ORDER — PROPOFOL 10 MG/ML IV BOLUS
INTRAVENOUS | Status: DC | PRN
  Administered 2022-09-06: 100 mg via INTRAVENOUS

## 2022-09-06 MED ORDER — ONDANSETRON HCL 4 MG/2ML IJ SOLN: 4.0000 mg | Freq: Once | INTRAMUSCULAR | Status: DC | PRN

## 2022-09-06 MED ORDER — PROPOFOL 10 MG/ML IV BOLUS
INTRAVENOUS | Status: AC
  Filled 2022-09-06: qty 20

## 2022-09-06 MED ORDER — ORAL CARE MOUTH RINSE: 15.0000 mL | Freq: Once | OROMUCOSAL | Status: AC

## 2022-09-06 MED ORDER — ONDANSETRON HCL 4 MG/2ML IJ SOLN
INTRAMUSCULAR | Status: AC
  Filled 2022-09-06: qty 2

## 2022-09-06 MED ORDER — DEXAMETHASONE SODIUM PHOSPHATE 10 MG/ML IJ SOLN
INTRAMUSCULAR | Status: AC
  Filled 2022-09-06: qty 1

## 2022-09-06 MED ORDER — DEXAMETHASONE SODIUM PHOSPHATE 10 MG/ML IJ SOLN
INTRAMUSCULAR | Status: DC | PRN
  Administered 2022-09-06: 4 mg via INTRAVENOUS

## 2022-09-06 MED ORDER — FENTANYL CITRATE (PF) 250 MCG/5ML IJ SOLN
INTRAMUSCULAR | Status: DC | PRN
  Administered 2022-09-06: 50 ug via INTRAVENOUS

## 2022-09-06 MED ORDER — ROCURONIUM BROMIDE 10 MG/ML (PF) SYRINGE
PREFILLED_SYRINGE | INTRAVENOUS | Status: DC | PRN
  Administered 2022-09-06: 40 mg via INTRAVENOUS

## 2022-09-06 MED ORDER — EPINEPHRINE PF 1 MG/ML IJ SOLN
INTRAMUSCULAR | Status: AC
  Filled 2022-09-06: qty 1

## 2022-09-06 MED ORDER — CHLORHEXIDINE GLUCONATE 0.12 % MT SOLN
15.0000 mL | Freq: Once | OROMUCOSAL | Status: AC
  Administered 2022-09-06: 15 mL via OROMUCOSAL
  Filled 2022-09-06: qty 15

## 2022-09-06 MED ORDER — ROCURONIUM BROMIDE 10 MG/ML (PF) SYRINGE
PREFILLED_SYRINGE | INTRAVENOUS | Status: AC
  Filled 2022-09-06: qty 10

## 2022-09-06 MED ORDER — FENTANYL CITRATE (PF) 250 MCG/5ML IJ SOLN
INTRAMUSCULAR | Status: AC
  Filled 2022-09-06: qty 5

## 2022-09-06 MED ORDER — MIDAZOLAM HCL 2 MG/2ML IJ SOLN
INTRAMUSCULAR | Status: AC
  Filled 2022-09-06: qty 2

## 2022-09-06 MED ORDER — MIDAZOLAM HCL 5 MG/5ML IJ SOLN
INTRAMUSCULAR | Status: DC | PRN
  Administered 2022-09-06: 2 mg via INTRAVENOUS

## 2022-09-06 MED ORDER — LACTATED RINGERS IV SOLN: INTRAVENOUS | Status: DC

## 2022-09-06 MED ORDER — 0.9 % SODIUM CHLORIDE (POUR BTL) OPTIME
TOPICAL | Status: DC | PRN
  Administered 2022-09-06: 1000 mL

## 2022-09-06 MED ORDER — SUGAMMADEX SODIUM 200 MG/2ML IV SOLN
INTRAVENOUS | Status: DC | PRN
  Administered 2022-09-06: 200 mg via INTRAVENOUS

## 2022-09-06 MED ORDER — LIDOCAINE 2% (20 MG/ML) 5 ML SYRINGE
INTRAMUSCULAR | Status: DC | PRN
  Administered 2022-09-06: 60 mg via INTRAVENOUS

## 2022-09-06 MED ORDER — PHENYLEPHRINE 80 MCG/ML (10ML) SYRINGE FOR IV PUSH (FOR BLOOD PRESSURE SUPPORT)
PREFILLED_SYRINGE | INTRAVENOUS | Status: DC | PRN
  Administered 2022-09-06 (×2): 160 ug via INTRAVENOUS

## 2022-09-06 MED ORDER — ACETAMINOPHEN 500 MG PO TABS
1000.0000 mg | ORAL_TABLET | Freq: Once | ORAL | Status: AC
  Administered 2022-09-06: 1000 mg via ORAL
  Filled 2022-09-06: qty 2

## 2022-09-06 MED ORDER — LIDOCAINE 2% (20 MG/ML) 5 ML SYRINGE
INTRAMUSCULAR | Status: AC
  Filled 2022-09-06: qty 5

## 2022-09-06 MED ORDER — ONDANSETRON HCL 4 MG/2ML IJ SOLN
INTRAMUSCULAR | Status: DC | PRN
  Administered 2022-09-06: 4 mg via INTRAVENOUS

## 2022-09-06 SURGICAL SUPPLY — 36 items
ADAPTER VALVE BIOPSY EBUS (MISCELLANEOUS) IMPLANT
ADPTR VALVE BIOPSY EBUS (MISCELLANEOUS) ×1
BLADE CLIPPER SURG (BLADE) ×2 IMPLANT
CANISTER SUCT 3000ML PPV (MISCELLANEOUS) ×2 IMPLANT
CNTNR URN SCR LID CUP LEK RST (MISCELLANEOUS) ×2 IMPLANT
CONT SPEC 4OZ STRL OR WHT (MISCELLANEOUS) ×1
COVER BACK TABLE 60X90IN (DRAPES) ×2 IMPLANT
FILTER STRAW FLUID ASPIR (MISCELLANEOUS) IMPLANT
FORCEPS BIOP RJ4 1.8 (CUTTING FORCEPS) IMPLANT
GAUZE 4X4 16PLY ~~LOC~~+RFID DBL (SPONGE) ×2 IMPLANT
GAUZE SPONGE 4X4 12PLY STRL (GAUZE/BANDAGES/DRESSINGS) ×2 IMPLANT
GLOVE SS BIOGEL STRL SZ 7.5 (GLOVE) ×2 IMPLANT
GLOVE SURG SIGNA 7.5 PF LTX (GLOVE) ×2 IMPLANT
GOWN STRL REUS W/ TWL XL LVL3 (GOWN DISPOSABLE) ×2 IMPLANT
GOWN STRL REUS W/TWL XL LVL3 (GOWN DISPOSABLE) ×1
KIT CLEAN ENDO COMPLIANCE (KITS) ×2 IMPLANT
KIT TURNOVER KIT B (KITS) ×2 IMPLANT
MARKER SKIN DUAL TIP RULER LAB (MISCELLANEOUS) ×2 IMPLANT
NS IRRIG 1000ML POUR BTL (IV SOLUTION) ×2 IMPLANT
OIL SILICONE PENTAX (PARTS (SERVICE/REPAIRS)) IMPLANT
PAD ARMBOARD 7.5X6 YLW CONV (MISCELLANEOUS) ×4 IMPLANT
SPONGE T-LAP 18X18 ~~LOC~~+RFID (SPONGE) ×8 IMPLANT
SPONGE T-LAP 4X18 ~~LOC~~+RFID (SPONGE) ×2 IMPLANT
STOPCOCK MORSE 400PSI 3WAY (MISCELLANEOUS) ×2 IMPLANT
SYR 10ML LL (SYRINGE) ×2 IMPLANT
SYR 20ML ECCENTRIC (SYRINGE) ×2 IMPLANT
TOWEL GREEN STERILE (TOWEL DISPOSABLE) ×2 IMPLANT
TOWEL GREEN STERILE FF (TOWEL DISPOSABLE) ×2 IMPLANT
TOWEL NATURAL 4PK STERILE (DISPOSABLE) ×2 IMPLANT
TRAP SPECIMEN MUCUS 40CC (MISCELLANEOUS) ×2 IMPLANT
TUBE CONNECTING 20X1/4 (TUBING) ×2 IMPLANT
UNDERPAD 30X36 HEAVY ABSORB (UNDERPADS AND DIAPERS) ×2 IMPLANT
VALVE BIOPSY  SINGLE USE (MISCELLANEOUS) ×1
VALVE BIOPSY SINGLE USE (MISCELLANEOUS) ×2 IMPLANT
VALVE SUCTION BRONCHIO DISP (MISCELLANEOUS) ×2 IMPLANT
WATER STERILE IRR 1000ML POUR (IV SOLUTION) ×2 IMPLANT

## 2022-09-06 NOTE — Anesthesia Postprocedure Evaluation (Signed)
Anesthesia Post Note  Patient: Sean Rose  Procedure(s) Performed: VIDEO BRONCHOSCOPY WITH REMOVAL OF INTRABRONCHIAL VALVE (IBV)     Patient location during evaluation: PACU Anesthesia Type: General Level of consciousness: awake and alert Pain management: pain level controlled Vital Signs Assessment: post-procedure vital signs reviewed and stable Respiratory status: spontaneous breathing, nonlabored ventilation, respiratory function stable and patient connected to nasal cannula oxygen Cardiovascular status: blood pressure returned to baseline and stable Postop Assessment: no apparent nausea or vomiting Anesthetic complications: no   No notable events documented.  Last Vitals:  Vitals:   09/06/22 1101 09/06/22 1418  BP: 136/84 (!) 144/84  Pulse: 89 72  Resp:  12  Temp: 36.5 C 36.5 C  SpO2: 100% 100%    Last Pain:  Vitals:   09/06/22 1418  TempSrc:   PainSc: 0-No pain                 Santa Lighter

## 2022-09-06 NOTE — Transfer of Care (Signed)
Immediate Anesthesia Transfer of Care Note  Patient: Sean Rose  Procedure(s) Performed: VIDEO BRONCHOSCOPY WITH REMOVAL OF INTRABRONCHIAL VALVE (IBV)  Patient Location: PACU  Anesthesia Type:General  Level of Consciousness: awake and patient cooperative  Airway & Oxygen Therapy: Patient Spontanous Breathing and Patient connected to face mask oxygen  Post-op Assessment: Report given to RN and Post -op Vital signs reviewed and stable  Post vital signs: Reviewed and stable  Last Vitals:  Vitals Value Taken Time  BP 144/84 09/06/22 1418  Temp    Pulse 72 09/06/22 1423  Resp 11 09/06/22 1423  SpO2 99 % 09/06/22 1423  Vitals shown include unvalidated device data.  Last Pain:  Vitals:   09/06/22 1157  TempSrc:   PainSc: 0-No pain         Complications: No notable events documented.

## 2022-09-06 NOTE — Anesthesia Procedure Notes (Signed)
Procedure Name: Intubation Date/Time: 09/06/2022 1:54 PM  Performed by: Lowella Dell, CRNAPre-anesthesia Checklist: Patient identified, Emergency Drugs available, Suction available and Patient being monitored Patient Re-evaluated:Patient Re-evaluated prior to induction Oxygen Delivery Method: Circle System Utilized Preoxygenation: Pre-oxygenation with 100% oxygen Induction Type: IV induction Ventilation: Mask ventilation without difficulty Laryngoscope Size: Mac and 4 Grade View: Grade II Tube type: Oral Tube size: 8.5 mm Number of attempts: 1 Airway Equipment and Method: Stylet and Oral airway Placement Confirmation: ETT inserted through vocal cords under direct vision, positive ETCO2 and breath sounds checked- equal and bilateral Secured at: 22 cm Tube secured with: Tape Dental Injury: Teeth and Oropharynx as per pre-operative assessment

## 2022-09-06 NOTE — Progress Notes (Signed)
Spoke to Funk in [employer], Covid test is negative.

## 2022-09-06 NOTE — Brief Op Note (Signed)
09/06/2022  2:22 PM  PATIENT:  Sean Rose  67 y.o. male  PRE-OPERATIVE DIAGNOSIS:  INTRABRONCHIAL VALVES FOR AIR LEAK  POST-OPERATIVE DIAGNOSIS:  INTRABRONCHIAL VALVES FOR AIR LEAK  PROCEDURE:  Procedure(s): VIDEO BRONCHOSCOPY WITH REMOVAL OF INTRABRONCHIAL VALVE (IBV) (N/A)  SURGEON:  Surgeon(s) and Role:    * Melrose Nakayama, MD - Primary  PHYSICIAN ASSISTANT:   ASSISTANTS: none   ANESTHESIA:   general  EBL: none  BLOOD ADMINISTERED:none  DRAINS: none   LOCAL MEDICATIONS USED:  NONE  SPECIMEN:  Source of Specimen:  IBV x 2  DISPOSITION OF SPECIMEN:  N/A  COUNTS:  NO endoscopic  TOURNIQUET:  * No tourniquets in log *  DICTATION: .Other Dictation: Dictation Number -  PLAN OF CARE: Discharge to home after PACU  PATIENT DISPOSITION:  PACU - hemodynamically stable.   Delay start of Pharmacological VTE agent (>24hrs) due to surgical blood loss or risk of bleeding: not applicable

## 2022-09-06 NOTE — Discharge Instructions (Addendum)
Do not drive or engage in heavy physical activity for 24 hours.  You may resume normal activities tomorrow.  You may cough up small amounts of blood over the next day or two.    Call [phone] if you develop chest pain, shortness of breath, fever > 101 F or cough up more than a tablespoon of blood.  My office will contact you with a follow up appointment.

## 2022-09-06 NOTE — Interval H&P Note (Signed)
History and Physical Interval Note: CXR left basilar pneumo resolved, apical unchanged, right side unchanged  09/06/2022 12:32 PM  Sean Rose  has presented today for surgery, with the diagnosis of [place].  The various methods of treatment have been discussed with the patient and family. After consideration of risks, benefits and other options for treatment, the patient has consented to  Procedure(s): VIDEO BRONCHOSCOPY WITH REMOVAL OF INTRABRONCHIAL VALVE (IBV) (N/A) as a surgical intervention.  The patient's history has been reviewed, patient examined, no change in status, stable for surgery.  I have reviewed the patient's chart and labs.  Questions were answered to the patient's satisfaction.     Melrose Nakayama

## 2022-09-06 NOTE — Op Note (Signed)
NAME: Sean Rose, Sean W. MEDICAL RECORD NO: [id number] ACCOUNT NO: [id number] DATE OF BIRTH: 1956/04/29 FACILITY: [facility] LOCATION: [facility] PHYSICIAN: Revonda Standard. Roxan Hockey, MD  Operative Report   DATE OF PROCEDURE: 09/06/2022  PREOPERATIVE DIAGNOSIS:  Pneumothorax requiring intrabronchial valve placement.  POSTOPERATIVE DIAGNOSIS:  Pneumothorax requiring intrabronchial valve placement.  PROCEDURE:  Bronchoscopy for removal of intrabronchial valves x2.  SURGEON:  Revonda Standard. Roxan Hockey, MD  ASSISTANT:  None.  ANESTHESIA:  General.  FINDINGS:  Valves were easily removed.  CLINICAL NOTE: Sean Rose is a 67 year old gentleman with a history of lung cancer with previous lobectomy and end-stage COPD with severe bullous emphysema.  He developed a loculated right pneumothorax with pneumomediastinum due to blebs in the right middle lobe.  He had a prolonged air leak. Intrabronchial valves were placed in both the medial and lateral segmental bronchi of the middle lobe.  He had resolution of the air leak.  It has now been about 8 weeks from the time of placement and he returns for endobronchial valve removal.  The indications, risks, benefits, and alternatives were discussed in detail with the patient.  He understood and accepted the risks and agreed to proceed.  DESCRIPTION OF PROCEDURE: Sean Rose was brought to the operating room on 09/06/2022.  He had induction of general anesthesia and was intubated.  Sequential compression devices were placed for DVT prophylaxis.  A Bair Hugger was placed for active warming.  A timeout was performed.  Flexible fiberoptic bronchoscopy was performed via the endotracheal tube.  There were minimal secretions.  The right upper lobe bronchus was well healed.  The valves were easily visible in the segmental bronchi of the right middle lobe.  Otherwise, the findings on bronchoscopy were unremarkable. A biopsy forceps was used to grasp the valve in the medial segmental  bronchus and it was removed intact easily.   The valve in the lateral segmental bronchus also was removed fully intact without difficulty.  Reinspection was performed with the bronchoscope.  There was no bleeding.  The patient then was extubated in the operating room and taken to the postanesthetic care unit in good condition.   PUS D: 09/06/2022 2:30:44 pm T: 09/06/2022 6:13:00 pm  JOB: [id number]/ [id number]

## 2022-09-07 ENCOUNTER — Encounter (HOSPITAL_COMMUNITY): Payer: Self-pay | Admitting: Thoracic Surgery (Cardiothoracic Vascular Surgery)

## 2022-09-12 ENCOUNTER — Inpatient Hospital Stay: Payer: BC Managed Care – PPO | Attending: Internal Medicine

## 2022-09-12 ENCOUNTER — Other Ambulatory Visit: Payer: Self-pay

## 2022-09-12 DIAGNOSIS — Z452 Encounter for adjustment and management of vascular access device: Secondary | ICD-10-CM | POA: Insufficient documentation

## 2022-09-12 DIAGNOSIS — Z95828 Presence of other vascular implants and grafts: Secondary | ICD-10-CM

## 2022-09-12 DIAGNOSIS — C3411 Malignant neoplasm of upper lobe, right bronchus or lung: Secondary | ICD-10-CM | POA: Insufficient documentation

## 2022-09-12 MED ORDER — HEPARIN SOD (PORK) LOCK FLUSH 100 UNIT/ML IV SOLN
500.0000 [IU] | Freq: Once | INTRAVENOUS | Status: AC
  Administered 2022-09-12: 500 [IU]

## 2022-09-12 MED ORDER — SODIUM CHLORIDE 0.9% FLUSH
10.0000 mL | Freq: Once | INTRAVENOUS | Status: AC
  Administered 2022-09-12: 10 mL

## 2022-09-16 ENCOUNTER — Other Ambulatory Visit: Payer: Self-pay | Admitting: Thoracic Surgery (Cardiothoracic Vascular Surgery)

## 2022-09-16 DIAGNOSIS — J939 Pneumothorax, unspecified: Secondary | ICD-10-CM

## 2022-09-17 ENCOUNTER — Ambulatory Visit
Admission: RE | Admit: 2022-09-17 | Discharge: 2022-09-17 | Disposition: A | Discharge status: Home / Self Care | Payer: BC Managed Care – PPO | Source: Ambulatory Visit | Attending: Thoracic Surgery (Cardiothoracic Vascular Surgery) | Admitting: Thoracic Surgery (Cardiothoracic Vascular Surgery)

## 2022-09-17 ENCOUNTER — Telehealth: Payer: Self-pay

## 2022-09-17 ENCOUNTER — Ambulatory Visit (INDEPENDENT_AMBULATORY_CARE_PROVIDER_SITE_OTHER): Payer: BC Managed Care – PPO | Admitting: Thoracic Surgery (Cardiothoracic Vascular Surgery)

## 2022-09-17 VITALS — BP 122/87 | HR 71 | Resp 18 | Ht 68.0 in | Wt 100.0 lb

## 2022-09-17 DIAGNOSIS — J9383 Other pneumothorax: Secondary | ICD-10-CM | POA: Diagnosis not present

## 2022-09-17 DIAGNOSIS — J939 Pneumothorax, unspecified: Secondary | ICD-10-CM

## 2022-09-17 DIAGNOSIS — J90 Pleural effusion, not elsewhere classified: Secondary | ICD-10-CM | POA: Diagnosis not present

## 2022-09-17 NOTE — Telephone Encounter (Signed)
I reserved March 4th 2024 at 10:30 AM for an appt with Dr Valeta Harms and called pt to inform him of the date and time. I LMTCB.

## 2022-09-17 NOTE — Progress Notes (Signed)
[address]       [place] [address]             [phone]    HPI: Sean Rose returns for follow-up after removal of his intrabronchial valves.  Sean Rose is a 67 year old man with severe COPD, history of tobacco abuse, stage IIIa lung cancer, right upper lobectomy, and bilateral pneumothoraces.  He had had a to been on the right side for prolonged period of time with a persistent air leak and intrabronchial valves were placed.  He subsequently developed a left pneumothorax which was treated with a chest tube and talc pleurodesis.  His right-sided airleak resolved and he had endobronchial valve removal on 09/06/2022.  The procedure was uncomplicated.  He is fine at rest, but is dyspneic with minimal exertion.  Past Medical History:  Diagnosis Date   Allergy    Anemia    Anginal pain ([place])    Blood transfusion without reported diagnosis    CAD (coronary artery disease)    BMS to RCA 2007. Low risk nuclear stress 2020.   CAP (community acquired pneumonia) 09/2016   COPD (chronic obstructive pulmonary disease) (HCC)    Dyspnea    occasionally   Emphysema of lung (HCC)    GERD (gastroesophageal reflux disease)    Heart murmur    "I was told I had one when I was a kid"   Hemorrhoids    History of anal fissures    "no surgeries" (10/30/2016)   Hyperlipidemia    Hypertension    lung ca dx'd 08/2018   with mets to bones in [place]   Myocardial infarction [facility])    Peripheral arterial disease ([place])    status post right common iliac artery stenting back in 2007   Seasonal allergies    Tobacco abuse     Current Outpatient Medications  Medication Sig Dispense Refill   amLODipine (NORVASC) 5 MG tablet TAKE 1 TABLET BY MOUTH DAILY 180 tablet 0   aspirin EC 81 MG tablet Take 81 mg by mouth daily.     clopidogrel (PLAVIX) 75 MG tablet Take 1 tablet (75 mg total) by mouth daily.     doxylamine, Sleep, (UNISOM) 25 MG tablet Take 25 mg by mouth at bedtime as  needed for sleep.     esomeprazole (NEXIUM) 20 MG capsule Take 40 mg by mouth daily.     fexofenadine (ALLEGRA) 180 MG tablet Take 180 mg by mouth daily.     Fluticasone-Umeclidin-Vilant (TRELEGY ELLIPTA) 100-62.5-25 MCG/ACT AEPB Inhale 1 puff into the lungs daily. 60 each 3   folic acid (FOLVITE) 1 MG tablet Take 1 tablet (1 mg total) by mouth daily.     Magnesium 500 MG TABS Take 1 tablet (500 mg total) by mouth in the morning and at bedtime.     metoprolol succinate (TOPROL-XL) 25 MG 24 hr tablet TAKE 1 TABLET BY MOUTH DAILY 90 tablet 1   mirtazapine (REMERON) 15 MG tablet TAKE 1 TABLET(15 MG) BY MOUTH AT BEDTIME 90 tablet 0   Multiple Vitamin (MULTIVITAMIN WITH MINERALS) TABS tablet Take 1 tablet by mouth daily.     rosuvastatin (CRESTOR) 5 MG tablet TAKE 1 TABLET(5 MG) BY MOUTH EVERY OTHER DAY 45 tablet 0   No current facility-administered medications for this visit.   Facility-Administered Medications Ordered in Other Visits  Medication Dose Route Frequency Provider Last Rate Last Admin   diphenhydrAMINE (BENADRYL) 25 mg capsule  Physical Exam BP 122/87 (BP Location: Left Arm, Patient Position: Sitting)   Pulse 71   Resp 18   Ht 5\' 8"  (1.727 m)   Wt 100 lb (45.4 kg)   SpO2 97% Comment: RA  BMI 15.30 kg/m  67 year old man in no acute distress Cachectic Alert and oriented x 3 with no focal deficits Lungs with diminished breath sounds bilaterally but no wheezing  Diagnostic Tests: CHEST - 2 VIEW   COMPARISON:  09/06/2022   FINDINGS: Right Port-A-Cath tip superior caval/atrial junction. Midline trachea. Normal heart size. Small right pleural effusion is similar. A left-sided hydropneumothorax is again identified. The pleural air component at the apex is similar to slightly increased. The pleural fluid component at the apex is increased, with a new fluid level. The amount of fluid in the left costophrenic angle is similar.   Underlying emphysema with bibasilar  scarring. No new pulmonary opacity. Surgical sutures in the right upper lung.   IMPRESSION: Small left hydropneumothorax, loculated at the left apex with similar to slightly increased pleural air component and increased pleural fluid component.   Otherwise, similar appearance of emphysema, scarring, and small right pleural effusion.     Electronically Signed   By: Abigail Miyamoto M.D.   On: 09/17/2022 11:29 I personally reviewed the CT images.  Right-sided stable.  Continued evolution of small apical space on the left.  Impression: Sean Rose is a 67 year old man with severe COPD, history of tobacco abuse, stage IIIa lung cancer, right upper lobectomy, and bilateral pneumothoraces.  He had endobronchial valves placed on the right side for persistent air leak.  Those were removed about 10 days ago.  Continues to have dyspnea.  Reviewing his chest x-ray there is not anything surgical or mechanical we can do to improve his respiratory status.  Best option is to have him follow-up with pulmonology to see if there is any combination of bronchodilators that will help or whether he needs home oxygen.   Plan: Follow-up with Dr. Valeta Harms regarding dyspnea. I will be happy to see Sean Rose back anytime if I can be of any assistance with his care.  Melrose Nakayama, MD [facility] [phone]

## 2022-09-22 ENCOUNTER — Other Ambulatory Visit: Payer: Self-pay | Admitting: Cardiovascular Disease

## 2022-09-25 ENCOUNTER — Other Ambulatory Visit: Payer: Self-pay | Admitting: Cardiovascular Disease

## 2022-09-30 ENCOUNTER — Ambulatory Visit: Payer: BC Managed Care – PPO | Admitting: Pulmonary Disease

## 2022-09-30 ENCOUNTER — Other Ambulatory Visit: Payer: BC Managed Care – PPO

## 2022-09-30 ENCOUNTER — Inpatient Hospital Stay: Payer: BC Managed Care – PPO | Attending: Internal Medicine

## 2022-09-30 ENCOUNTER — Ambulatory Visit (HOSPITAL_COMMUNITY)
Admission: RE | Admit: 2022-09-30 | Discharge: 2022-09-30 | Disposition: A | Discharge status: Home / Self Care | Payer: BC Managed Care – PPO | Source: Ambulatory Visit | Attending: Internal Medicine | Admitting: Internal Medicine

## 2022-09-30 ENCOUNTER — Other Ambulatory Visit: Payer: Self-pay

## 2022-09-30 DIAGNOSIS — H538 Other visual disturbances: Secondary | ICD-10-CM | POA: Diagnosis not present

## 2022-09-30 DIAGNOSIS — C3490 Malignant neoplasm of unspecified part of unspecified bronchus or lung: Secondary | ICD-10-CM

## 2022-09-30 DIAGNOSIS — J90 Pleural effusion, not elsewhere classified: Secondary | ICD-10-CM | POA: Diagnosis not present

## 2022-09-30 DIAGNOSIS — Z7902 Long term (current) use of antithrombotics/antiplatelets: Secondary | ICD-10-CM | POA: Insufficient documentation

## 2022-09-30 DIAGNOSIS — C3411 Malignant neoplasm of upper lobe, right bronchus or lung: Secondary | ICD-10-CM | POA: Diagnosis not present

## 2022-09-30 DIAGNOSIS — D509 Iron deficiency anemia, unspecified: Secondary | ICD-10-CM | POA: Diagnosis not present

## 2022-09-30 DIAGNOSIS — R0600 Dyspnea, unspecified: Secondary | ICD-10-CM | POA: Diagnosis not present

## 2022-09-30 DIAGNOSIS — N289 Disorder of kidney and ureter, unspecified: Secondary | ICD-10-CM | POA: Insufficient documentation

## 2022-09-30 DIAGNOSIS — Z7982 Long term (current) use of aspirin: Secondary | ICD-10-CM | POA: Diagnosis not present

## 2022-09-30 DIAGNOSIS — H269 Unspecified cataract: Secondary | ICD-10-CM | POA: Insufficient documentation

## 2022-09-30 DIAGNOSIS — C7951 Secondary malignant neoplasm of bone: Secondary | ICD-10-CM | POA: Insufficient documentation

## 2022-09-30 DIAGNOSIS — R918 Other nonspecific abnormal finding of lung field: Secondary | ICD-10-CM | POA: Diagnosis not present

## 2022-09-30 DIAGNOSIS — Z902 Acquired absence of lung [part of]: Secondary | ICD-10-CM | POA: Insufficient documentation

## 2022-09-30 DIAGNOSIS — K760 Fatty (change of) liver, not elsewhere classified: Secondary | ICD-10-CM | POA: Diagnosis not present

## 2022-09-30 DIAGNOSIS — Z95828 Presence of other vascular implants and grafts: Secondary | ICD-10-CM

## 2022-09-30 DIAGNOSIS — R040 Epistaxis: Secondary | ICD-10-CM | POA: Diagnosis not present

## 2022-09-30 LAB — CBC WITH DIFFERENTIAL (CANCER CENTER ONLY)
Abs Immature Granulocytes: 0.04 10*3/uL (ref 0.00–0.07)
Basophils Absolute: 0.1 10*3/uL (ref 0.0–0.1)
Basophils Relative: 1 %
Eosinophils Absolute: 0.5 10*3/uL (ref 0.0–0.5)
Eosinophils Relative: 4 %
HCT: 27.7 % — ABNORMAL LOW (ref 39.0–52.0)
Hemoglobin: 9.7 g/dL — ABNORMAL LOW (ref 13.0–17.0)
Immature Granulocytes: 0 %
Lymphocytes Relative: 27 %
Lymphs Abs: 2.9 10*3/uL (ref 0.7–4.0)
MCH: 30.9 pg (ref 26.0–34.0)
MCHC: 35 g/dL (ref 30.0–36.0)
MCV: 88.2 fL (ref 80.0–100.0)
Monocytes Absolute: 1.1 10*3/uL — ABNORMAL HIGH (ref 0.1–1.0)
Monocytes Relative: 10 %
Neutro Abs: 6.4 10*3/uL (ref 1.7–7.7)
Neutrophils Relative %: 58 %
Platelet Count: 549 10*3/uL — ABNORMAL HIGH (ref 150–400)
RBC: 3.14 MIL/uL — ABNORMAL LOW (ref 4.22–5.81)
RDW: 15 % (ref 11.5–15.5)
WBC Count: 11 10*3/uL — ABNORMAL HIGH (ref 4.0–10.5)
nRBC: 0 % (ref 0.0–0.2)

## 2022-09-30 LAB — CMP (CANCER CENTER ONLY)
ALT: 9 U/L (ref 0–44)
AST: 14 U/L — ABNORMAL LOW (ref 15–41)
Albumin: 3.8 g/dL (ref 3.5–5.0)
Alkaline Phosphatase: 83 U/L (ref 38–126)
Anion gap: 8 (ref 5–15)
BUN: 25 mg/dL — ABNORMAL HIGH (ref 8–23)
CO2: 25 mmol/L (ref 22–32)
Calcium: 9.3 mg/dL (ref 8.9–10.3)
Chloride: 91 mmol/L — ABNORMAL LOW (ref 98–111)
Creatinine: 1.68 mg/dL — ABNORMAL HIGH (ref 0.61–1.24)
GFR, Estimated: 45 mL/min — ABNORMAL LOW (ref >60)
Glucose, Bld: 106 mg/dL — ABNORMAL HIGH (ref 70–99)
Potassium: 3.6 mmol/L (ref 3.5–5.1)
Sodium: 124 mmol/L — ABNORMAL LOW (ref 135–145)
Total Bilirubin: 0.3 mg/dL (ref 0.3–1.2)
Total Protein: 7.5 g/dL (ref 6.5–8.1)

## 2022-09-30 MED ORDER — IOHEXOL 9 MG/ML PO SOLN
500.0000 mL | ORAL | Status: AC
  Administered 2022-09-30: 500 mL via ORAL

## 2022-09-30 MED ORDER — HEPARIN SOD (PORK) LOCK FLUSH 100 UNIT/ML IV SOLN
INTRAVENOUS | Status: AC
  Filled 2022-09-30: qty 5

## 2022-09-30 MED ORDER — HEPARIN SOD (PORK) LOCK FLUSH 100 UNIT/ML IV SOLN
500.0000 [IU] | Freq: Once | INTRAVENOUS | Status: AC
  Administered 2022-09-30: 500 [IU]

## 2022-09-30 MED ORDER — HEPARIN SOD (PORK) LOCK FLUSH 100 UNIT/ML IV SOLN
500.0000 [IU] | Freq: Once | INTRAVENOUS | Status: AC
  Administered 2022-09-30: 500 [IU] via INTRAVENOUS

## 2022-09-30 MED ORDER — IOHEXOL 300 MG/ML  SOLN
100.0000 mL | Freq: Once | INTRAMUSCULAR | Status: AC | PRN
  Administered 2022-09-30: 100 mL via INTRAVENOUS

## 2022-09-30 MED ORDER — SODIUM CHLORIDE 0.9% FLUSH
10.0000 mL | Freq: Once | INTRAVENOUS | Status: AC
  Administered 2022-09-30: 10 mL

## 2022-10-03 ENCOUNTER — Ambulatory Visit: Payer: BC Managed Care – PPO | Admitting: Internal Medicine

## 2022-10-03 NOTE — Progress Notes (Deleted)
[facility] OFFICE PROGRESS NOTE  Sean Peng, NP [facility] [place] [address]  DIAGNOSIS: ***  PRIOR THERAPY:  CURRENT THERAPY:  INTERVAL HISTORY: Sean Rose 67 y.o. male returns for *** regular *** visit for followup of ***   MEDICAL HISTORY: Past Medical History:  Diagnosis Date   Allergy    Anemia    Anginal pain ([place])    Blood transfusion without reported diagnosis    CAD (coronary artery disease)    BMS to RCA 2007. Low risk nuclear stress 2020.   CAP (community acquired pneumonia) 09/2016   COPD (chronic obstructive pulmonary disease) ([place])    Dyspnea    occasionally   Emphysema of lung ([facility])    GERD (gastroesophageal reflux disease)    Heart murmur    "I was told I had one when I was a kid"   Hemorrhoids    History of anal fissures    "no surgeries" (10/30/2016)   Hyperlipidemia    Hypertension    lung ca dx'd 08/2018   with mets to bones in [place]   Myocardial infarction [facility])    Peripheral arterial disease ([place])    status post right common iliac artery stenting back in 2007   Seasonal allergies    Tobacco abuse     ALLERGIES:  is allergic to compazine [prochlorperazine].  MEDICATIONS:  Current Outpatient Medications  Medication Sig Dispense Refill   amLODipine (NORVASC) 5 MG tablet TAKE 1 TABLET BY MOUTH DAILY 180 tablet 0   aspirin EC 81 MG tablet Take 81 mg by mouth daily.     clopidogrel (PLAVIX) 75 MG tablet TAKE 1 TABLET BY MOUTH EVERY DAY 90 tablet 1   doxylamine, Sleep, (UNISOM) 25 MG tablet Take 25 mg by mouth at bedtime as needed for sleep.     esomeprazole (NEXIUM) 20 MG capsule Take 40 mg by mouth daily.     fexofenadine (ALLEGRA) 180 MG tablet Take 180 mg by mouth daily.     Fluticasone-Umeclidin-Vilant (TRELEGY ELLIPTA) 100-62.5-25 MCG/ACT AEPB Inhale 1 puff into the lungs daily. 60 each 3   folic acid (FOLVITE) 1 MG tablet Take 1 tablet (1 mg total) by mouth daily.     Magnesium 500 MG TABS Take 1  tablet (500 mg total) by mouth in the morning and at bedtime.     metoprolol succinate (TOPROL-XL) 25 MG 24 hr tablet TAKE 1 TABLET BY MOUTH DAILY 90 tablet 1   mirtazapine (REMERON) 15 MG tablet TAKE 1 TABLET(15 MG) BY MOUTH AT BEDTIME 90 tablet 0   Multiple Vitamin (MULTIVITAMIN WITH MINERALS) TABS tablet Take 1 tablet by mouth daily.     rosuvastatin (CRESTOR) 5 MG tablet TAKE 1 TABLET(5 MG) BY MOUTH EVERY OTHER DAY 45 tablet 1   No current facility-administered medications for this visit.   Facility-Administered Medications Ordered in Other Visits  Medication Dose Route Frequency Provider Last Rate Last Admin   diphenhydrAMINE (BENADRYL) 25 mg capsule             SURGICAL HISTORY:  Past Surgical History:  Procedure Laterality Date   ANKLE SURGERY Left    "rebuilt it"   ANTERIOR CRUCIATE LIGAMENT REPAIR Right    CARDIAC CATHETERIZATION  09/23/2008   Continued medical therapy - may need GI evaluation in addition.   CARDIAC CATHETERIZATION  10/28/2007   Medical therapy recommended.   CARDIAC CATHETERIZATION  11/18/2006   In-stent restenosis RCA  (50% distal edge, 80% segmental mid, and 50-60%  segmental proximal). Successful cutting balloon atherectomy using a [id number] cutting balloon. 3 inflations with atherectomy performed on mid and proximal portions resulting in reduction of 80% mid in-stent restenosis to less than 20% residual and 50-60% segmental proximal to less than 20% residual without dissection.   CARDIAC CATHETERIZATION  02/26/2006   Severe stenosis in RCA. Stenting performed using IVUS. 3.5x20 Maverick balloon deployed at [employer]. Distal stent-a 4x28 Liberte stent-deployed 12atm 48sec, 12atm 31sec, 4atm 19sec. Mid stent-a 4x28 Liberte stent-deployed 14atm 45sec, 14atm 60sec, 14atm 44sec. Proximal stent-4x8 Liberte- 14atm 45sec,14atm 47sec, 16atm 43sec. Severely diseased segment then appeared TIMI-3 flow.   CARDIOVASCULAR STRESS TEST  11/17/2012   No significant ECG changes. Septal  perfusion defect is new when complared to study from 2010. Abnormal myocardial perfusion imaging with a basal to mid perfusion suggestive of previous MI.   CAROTID DOPPLER  08/09/2011   Bilateral Bulb/Proximal ICA - demonstrated a mild amount of fibrous plaque without evidence of significant diameter reduction reduction or other vascular abnormality.   CHEST TUBE INSERTION Right 09/02/2018   Procedure: Chest Tube Insertion;  Surgeon: Garner Nash, DO;  Location: [facility];  Service: Thoracic;  Laterality: Right;   COLONOSCOPY     2003, 2014   CORONARY ANGIOPLASTY WITH STENT PLACEMENT     FEMORAL ARTERY STENT     INGUINAL HERNIA REPAIR Right    IR IMAGING GUIDED PORT INSERTION  11/13/2020   KNEE ARTHROSCOPY Right "multiple"   LAPAROSCOPIC APPENDECTOMY N/A 12/07/2018   Procedure: APPENDECTOMY LAPAROSCOPIC;  Surgeon: Coralie Keens, MD;  Location: [facility];  Service: General;  Laterality: N/A;   LOWER EXTREMITY ARTERIAL DOPPLER  01/31/2011   Bilateral ABIs-normal values with no suggestion of arterial insuff to the lower extremities at rest. Right CIA stent-mild amount of nonhemodynamically significant plaque is noted throughout   [place]   "bone-eating tumor"   PERCUTANEOUS STENT INTERVENTION  04/04/2006 & 04/13/2015   a. Right common iliac artery with an 8.0x18 mm Herculink stent deployed at 12 atm. Stenosis was reduced from 80% to 0% with brisk flow. b. I-cast stenting to left common iliac artery   PERIPHERAL VASCULAR CATHETERIZATION N/A 04/13/2015   Procedure: Lower Extremity Angiography;  Surgeon: Lorretta Harp, MD; L-oCIA 75%, 40-50% L-EIA, R-CIA stent patent, s/p 8 mm x 38 mm ICast covered stent>>0% stenosis in [place] Right    TRANSTHORACIC ECHOCARDIOGRAM  11/26/2012   EF not noted. Aortic valve-sclerosis without stenosis, no regurgiation.    UPPER GASTROINTESTINAL ENDOSCOPY     US CAROTID DOPPLER BILATERAL ([place] HX)  08/09/2011    Bilateral Bulb/Proximal ICAa demonstrated a mild amount of fibrous plaque without evidence of significant diameter reduction or any other vascular abnormality.   VIDEO ASSISTED THORACOSCOPY (VATS)/ LOBECTOMY Right 09/14/2018   Procedure: RIGHT VIDEO ASSISTED THORACOSCOPY (VATS)/ RIGHT UPPER LOBECTOMY;  Surgeon: Melrose Nakayama, MD;  Location: [facility];  Service: Thoracic;  Laterality: Right;   VIDEO BRONCHOSCOPY WITH ENDOBRONCHIAL NAVIGATION N/A 09/02/2018   Procedure: VIDEO BRONCHOSCOPY WITH ENDOBRONCHIAL NAVIGATION;  Surgeon: Garner Nash, DO;  Location: [facility];  Service: Thoracic;  Laterality: N/A;   VIDEO BRONCHOSCOPY WITH INSERTION OF INTERBRONCHIAL VALVE (IBV) N/A 07/10/2022   Procedure: VIDEO BRONCHOSCOPY WITH INSERTION OF INTERBRONCHIAL VALVE (IBV);  Surgeon: Melrose Nakayama, MD;  Location: [facility] OR;  Service: Thoracic;  Laterality: N/A;   VIDEO BRONCHOSCOPY WITH INSERTION OF INTERBRONCHIAL VALVE (IBV) N/A 09/06/2022   Procedure: VIDEO  BRONCHOSCOPY WITH REMOVAL OF INTRABRONCHIAL VALVE (IBV);  Surgeon: Melrose Nakayama, MD;  Location: [facility] OR;  Service: Thoracic;  Laterality: N/A;    REVIEW OF SYSTEMS:   Review of Systems  Constitutional: Negative for appetite change, chills, fatigue, fever and unexpected weight change.  HENT:   Negative for mouth sores, nosebleeds, sore throat and trouble swallowing.   Eyes: Negative for eye problems and icterus.  Respiratory: Negative for cough, hemoptysis, shortness of breath and wheezing.   Cardiovascular: Negative for chest pain and leg swelling.  Gastrointestinal: Negative for abdominal pain, constipation, diarrhea, nausea and vomiting.  Genitourinary: Negative for bladder incontinence, difficulty urinating, dysuria, frequency and hematuria.   Musculoskeletal: Negative for back pain, gait problem, neck pain and neck stiffness.  Skin: Negative for itching and rash.  Neurological: Negative for dizziness, extremity weakness, gait problem,  headaches, light-headedness and seizures.  Hematological: Negative for adenopathy. Does not bruise/bleed easily.  Psychiatric/Behavioral: Negative for confusion, depression and sleep disturbance. The patient is not nervous/anxious.     PHYSICAL EXAMINATION:  There were no vitals taken for this visit.  ECOG PERFORMANCE STATUS: {CHL ONC ECOG [id number]  Physical Exam  Constitutional: Oriented to person, place, and time and well-developed, well-nourished, and in no distress. No distress.  HENT:  Head: Normocephalic and atraumatic.  Mouth/Throat: Oropharynx is clear and moist. No oropharyngeal exudate.  Eyes: Conjunctivae are normal. Right eye exhibits no discharge. Left eye exhibits no discharge. No scleral icterus.  Neck: Normal range of motion. Neck supple.  Cardiovascular: Normal rate, regular rhythm, normal heart sounds and intact distal pulses.   Pulmonary/Chest: Effort normal and breath sounds normal. No respiratory distress. No wheezes. No rales.  Abdominal: Soft. Bowel sounds are normal. Exhibits no distension and no mass. There is no tenderness.  Musculoskeletal: Normal range of motion. Exhibits no edema.  Lymphadenopathy:    No cervical adenopathy.  Neurological: Alert and oriented to person, place, and time. Exhibits normal muscle tone. Gait normal. Coordination normal.  Skin: Skin is warm and dry. No rash noted. Not diaphoretic. No erythema. No pallor.  Psychiatric: Mood, memory and judgment normal.  Vitals reviewed.  LABORATORY DATA: Lab Results  Component Value Date   WBC 11.0 (H) 09/30/2022   HGB 9.7 (L) 09/30/2022   HCT 27.7 (L) 09/30/2022   MCV 88.2 09/30/2022   PLT 549 (H) 09/30/2022      Chemistry      Component Value Date/Time   NA 124 (L) 09/30/2022 1452   NA 130 (L) 03/05/2017 1110   K 3.6 09/30/2022 1452   CL 91 (L) 09/30/2022 1452   CO2 25 09/30/2022 1452   BUN 25 (H) 09/30/2022 1452   BUN 4 (L) 03/05/2017 1110   CREATININE 1.68 (H) 09/30/2022  1452   CREATININE 0.76 04/28/2020 0826      Component Value Date/Time   CALCIUM 9.3 09/30/2022 1452   ALKPHOS 83 09/30/2022 1452   AST 14 (L) 09/30/2022 1452   ALT 9 09/30/2022 1452   BILITOT 0.3 09/30/2022 1452       RADIOGRAPHIC STUDIES:  CT Abdomen Pelvis W Contrast  Result Date: 10/01/2022 CLINICAL DATA:  Staging non-small-cell lung cancer. * Tracking Code: BO * EXAM: CT CHEST, ABDOMEN, AND PELVIS WITH CONTRAST TECHNIQUE: Multidetector CT imaging of the chest, abdomen and pelvis was performed following the standard protocol during bolus administration of intravenous contrast. RADIATION DOSE REDUCTION: This exam was performed according to the departmental dose-optimization program which includes automated exposure control, adjustment of the mA and/or  kV according to patient size and/or use of iterative reconstruction technique. CONTRAST:  130m OMNIPAQUE IOHEXOL 300 MG/ML  SOLN COMPARISON:  CT 09/27/2021. FINDINGS: CT CHEST FINDINGS Cardiovascular: Right upper chest port. The port is accessed. There are atretic areas along the brachiocephalic vein and SVC. Scattered collateral vessels. The thoracic aorta has diffuse scattered calcified atherosclerotic plaque. Heart is nonenlarged. Trace pericardial fluid. Mediastinum/Nodes: No specific abnormal lymph node enlargement identified in the axillary region or hilum. Small left hilar nodes are identified, nonpathologic by size criteria. Similar. There is a what may be a necrotic lymph node subcarinal towards the right on series 2, image 26 measuring 17 x 10 mm today. Previously, retrospect this is not as well seen without the advantage of contrast and at that time would have measured 16 x 7 mm. The esophagus is diffusely patulous. Subtle areas of wall thickening as well. Small thyroid gland. Lungs/Pleura: Tiny left and small right pleural effusion with areas of pleural thickening and complexity. There calcifications on the left as well. Adjacent lung  opacities. There are basilar areas of scarring, fibrotic changes. At the left lung apex is a component of presumed loculated hydropneumothorax. This has a different configuration today than previous. The previous right-sided pneumothorax is improved. There are surgical changes. Underlying emphysematous lung changes. Bulla and bleb formation areas. Musculoskeletal: Osteopenia. Scattered degenerative changes. Stable sclerosis of the left scapula consistent with known metastatic disease. CT ABDOMEN PELVIS FINDINGS Hepatobiliary: Mild fatty liver infiltration. Patent portal vein. Gallbladder is nondilated. Pancreas: Unremarkable. No pancreatic ductal dilatation or surrounding inflammatory changes. Spleen: Normal in size without focal abnormality. Adrenals/Urinary Tract: Right adrenal gland and left adrenal gland is slightly thickened diffusely unchanged from prior. Mild bilateral renal atrophy. Enhancing renal mass or collecting system dilatation. Small area of calcification along the posterior aspect of the left kidney is stable. No abnormal calcifications seen within either kidney nor along the course of either ureter. Preserved contours of the urinary bladder. Stomach/Bowel: The stomach is dilated with fluid and debris. There are multiple areas of nodular fold thickening particularly along the body and antrum. Please correlate with any known history and recommend further workup when appropriate. Scattered colonic stool. There is normal course and caliber of the small and large bowel. Vascular/Lymphatic: Extensive vascular calcifications identified with multiple areas of significant narrowing along vessels including the iliac vessels, renal arteries, SMA. There is also dilatation of the infrarenal abdominal aorta focally, fusiform measuring up to 2.5 cm but not truly an aneurysm. Bilateral iliac artery stents. Preserved IVC. Retroaortic left renal vein. No clear abnormal lymph node enlargement identified in the  abdomen and pelvis. Reproductive: Prostate is unremarkable. Other: Anasarca.  No ascites. Musculoskeletal: Osteopenia and degenerative changes of the spine and pelvis. IMPRESSION: Bilateral pleural effusions identified with adjacent opacities. The effusions have loculated components with pleural thickening. There is also a component presumed loculated small left apical pneumothorax with fluid. This area of the left lung apex is new compared to prior CT scan but was seen on the x-ray of 09/17/2022. Slightly enlarging possibly centrally necrotic subcarinal lymph node. No other new areas enlargement. Persistent nodular fold thickening along the body and antrum of the stomach. Please correlate for any known history or dedicated workup is recommended. This includes the lesser curve of the stomach. Fatty liver infiltration. Extensive atherosclerotic calcified plaque with areas of stenosis. Iliac stents. Aortic Atherosclerosis (ICD10-I70.0) and Emphysema (ICD10-J43.9). Electronically Signed   By: AJill SideM.D.   On: 10/01/2022 15:42   CT  Chest W Contrast  Result Date: 10/01/2022 CLINICAL DATA:  Staging non-small-cell lung cancer. * Tracking Code: BO * EXAM: CT CHEST, ABDOMEN, AND PELVIS WITH CONTRAST TECHNIQUE: Multidetector CT imaging of the chest, abdomen and pelvis was performed following the standard protocol during bolus administration of intravenous contrast. RADIATION DOSE REDUCTION: This exam was performed according to the departmental dose-optimization program which includes automated exposure control, adjustment of the mA and/or kV according to patient size and/or use of iterative reconstruction technique. CONTRAST:  187m OMNIPAQUE IOHEXOL 300 MG/ML  SOLN COMPARISON:  CT 09/27/2021. FINDINGS: CT CHEST FINDINGS Cardiovascular: Right upper chest port. The port is accessed. There are atretic areas along the brachiocephalic vein and SVC. Scattered collateral vessels. The thoracic aorta has diffuse scattered  calcified atherosclerotic plaque. Heart is nonenlarged. Trace pericardial fluid. Mediastinum/Nodes: No specific abnormal lymph node enlargement identified in the axillary region or hilum. Small left hilar nodes are identified, nonpathologic by size criteria. Similar. There is a what may be a necrotic lymph node subcarinal towards the right on series 2, image 26 measuring 17 x 10 mm today. Previously, retrospect this is not as well seen without the advantage of contrast and at that time would have measured 16 x 7 mm. The esophagus is diffusely patulous. Subtle areas of wall thickening as well. Small thyroid gland. Lungs/Pleura: Tiny left and small right pleural effusion with areas of pleural thickening and complexity. There calcifications on the left as well. Adjacent lung opacities. There are basilar areas of scarring, fibrotic changes. At the left lung apex is a component of presumed loculated hydropneumothorax. This has a different configuration today than previous. The previous right-sided pneumothorax is improved. There are surgical changes. Underlying emphysematous lung changes. Bulla and bleb formation areas. Musculoskeletal: Osteopenia. Scattered degenerative changes. Stable sclerosis of the left scapula consistent with known metastatic disease. CT ABDOMEN PELVIS FINDINGS Hepatobiliary: Mild fatty liver infiltration. Patent portal vein. Gallbladder is nondilated. Pancreas: Unremarkable. No pancreatic ductal dilatation or surrounding inflammatory changes. Spleen: Normal in size without focal abnormality. Adrenals/Urinary Tract: Right adrenal gland and left adrenal gland is slightly thickened diffusely unchanged from prior. Mild bilateral renal atrophy. Enhancing renal mass or collecting system dilatation. Small area of calcification along the posterior aspect of the left kidney is stable. No abnormal calcifications seen within either kidney nor along the course of either ureter. Preserved contours of the  urinary bladder. Stomach/Bowel: The stomach is dilated with fluid and debris. There are multiple areas of nodular fold thickening particularly along the body and antrum. Please correlate with any known history and recommend further workup when appropriate. Scattered colonic stool. There is normal course and caliber of the small and large bowel. Vascular/Lymphatic: Extensive vascular calcifications identified with multiple areas of significant narrowing along vessels including the iliac vessels, renal arteries, SMA. There is also dilatation of the infrarenal abdominal aorta focally, fusiform measuring up to 2.5 cm but not truly an aneurysm. Bilateral iliac artery stents. Preserved IVC. Retroaortic left renal vein. No clear abnormal lymph node enlargement identified in the abdomen and pelvis. Reproductive: Prostate is unremarkable. Other: Anasarca.  No ascites. Musculoskeletal: Osteopenia and degenerative changes of the spine and pelvis. IMPRESSION: Bilateral pleural effusions identified with adjacent opacities. The effusions have loculated components with pleural thickening. There is also a component presumed loculated small left apical pneumothorax with fluid. This area of the left lung apex is new compared to prior CT scan but was seen on the x-ray of 09/17/2022. Slightly enlarging possibly centrally necrotic subcarinal lymph node.  No other new areas enlargement. Persistent nodular fold thickening along the body and antrum of the stomach. Please correlate for any known history or dedicated workup is recommended. This includes the lesser curve of the stomach. Fatty liver infiltration. Extensive atherosclerotic calcified plaque with areas of stenosis. Iliac stents. Aortic Atherosclerosis (ICD10-I70.0) and Emphysema (ICD10-J43.9). Electronically Signed   By: Jill Side M.D.   On: 10/01/2022 15:42   DG Chest 2 View  Result Date: 09/17/2022 CLINICAL DATA:  Surgery several months ago with left-sided pneumothorax  EXAM: CHEST - 2 VIEW COMPARISON:  09/06/2022 FINDINGS: Right Port-A-Cath tip superior caval/atrial junction. Midline trachea. Normal heart size. Small right pleural effusion is similar. A left-sided hydropneumothorax is again identified. The pleural air component at the apex is similar to slightly increased. The pleural fluid component at the apex is increased, with a new fluid level. The amount of fluid in the left costophrenic angle is similar. Underlying emphysema with bibasilar scarring. No new pulmonary opacity. Surgical sutures in the right upper lung. IMPRESSION: Small left hydropneumothorax, loculated at the left apex with similar to slightly increased pleural air component and increased pleural fluid component. Otherwise, similar appearance of emphysema, scarring, and small right pleural effusion. Electronically Signed   By: Abigail Miyamoto M.D.   On: 09/17/2022 11:29   DG Chest 2 View  Result Date: 09/06/2022 CLINICAL DATA:  [id number] Pre-op chest exam [id number], scheduled for bronchoscopic removal of intrabronchial valve due to air leak EXAM: CHEST - 2 VIEW COMPARISON:  08/19/2022 chest radiograph. FINDINGS: Right internal jugular Port-A-Cath terminates over the cavoatrial junction. Stable intrabronchial valves overlying the right lower hilum. Stable cardiomediastinal silhouette with normal heart size. Surgical sutures overlie the peripheral upper right lung. No right pneumothorax. Small right pleural effusion is stable. Small left hydropneumothorax, overall decreased, with stable apical pneumothorax component and resolved basilar pneumothorax component. No pulmonary edema. Emphysema. Similar mild streaky bibasilar opacities. IMPRESSION: 1. Small left hydropneumothorax, overall decreased. 2. Stable small right pleural effusion. 3. Emphysema. Similar mild streaky bibasilar opacities, favor scarring or atelectasis. Electronically Signed   By: Ilona Sorrel M.D.   On: 09/06/2022 11:46     ASSESSMENT/PLAN:  No  problem-specific Assessment & Plan notes found for this encounter.   No orders of the defined types were placed in this encounter.    I spent {CHL ONC TIME VISIT - [id number] counseling the patient face to face. The total time spent in the appointment was {CHL ONC TIME VISIT - [id number].  Jayro Mcmath L Tajai Suder, PA-C 10/03/22

## 2022-10-04 ENCOUNTER — Ambulatory Visit: Payer: BC Managed Care – PPO | Admitting: Pulmonary Disease

## 2022-10-04 ENCOUNTER — Telehealth: Payer: Self-pay | Admitting: Physician Assistant

## 2022-10-04 NOTE — Telephone Encounter (Signed)
Per 3/8 secure caht, pt has been r/s and confirmed new time

## 2022-10-06 NOTE — Progress Notes (Unsigned)
[place] OFFICE PROGRESS NOTE  [name], NP [place] [address]  DIAGNOSIS: Metastatic non-small cell lung cancer initially diagnosed as stage IIIA (T3, N1, M0) non-small cell lung cancer, adenocarcinoma with no actionable mutations presented with multiple pulmonary nodules in the right upper lobe as well as metastatic disease in intraparenchymal lymphadenopathy.  This was diagnosed in February 2020.  The patient has disease recurrence in March 2022. PD-L1 expression 20%. He has no actionable mutations by foundation 1.  PRIOR THERAPY: 1) Status post right upper lobectomy with lymph node dissection. 2) Adjuvant systemic chemotherapy with cisplatin 75 mg/M2 and Alimta 500 mg/M2 every 3 weeks.  Status post 4 cycles. 3) palliative radiotherapy to the metastatic bone disease and the scapula as well as the left supraclavicular area under the care of Dr. Isidore Moos. 3) Systemic chemotherapy with carboplatin for AUC of 5, Alimta 500 mg/M2 and Keytruda 200 mg IV every 3 weeks.  First dose November 06, 2020.  Status post 12 cycles.  Starting from cycle #5, the patient will be treated with maintenance Keytruda and Alimta IV every 3 weeks. Dose reduced to Alimta 400 mg/m2 starting from cycle #11 because of worsening anemia.  Keytruda discontinued from cycle #11 due to possible checkpoint inhibitor toxicity of the stomach.  CURRENT THERAPY: Observation   INTERVAL HISTORY: Sean Rose 67 y.o. male returns to the clinic today for a follow up visit accompanied by his wife. The patient has been off treatment since 2023. Chemotherapy was discontinued due to myelosuppression with frequent blood transfusions despite dose reduced chemo. The patient was last seen by Dr. [name] on 06/05/22.  At that time the patient had a restaging CT scan which showed pneumothorax and pneumomediastinum. He first had pigtail catheter placed. He had recurrent pneumothorax and ultimately had  endobronchial valve placement in December 2023. He had recurrent left pneumothorax treated with chest tube and talc pleurodesis. In January 2024, he incidentally was found to have basilar left sided pneumothorax. He has been following with [name]. and he had endobronchial valve removal on 09/06/2022.   Overall, he is feeling fair except for fatigue.  He has some cold intolerance secondary to his anemia.  He mentions that he occasionally has a nosebleed approximately once per week lasting 15 to 30 minutes.  He is on Plavix and aspirin under the direction of his cardiologist.  He could not tolerate an iron supplement and has been taking multivitamin with 100% daily recommended iron intake.  He reports that he has limited capacity for exercise tolerance secondary to his dyspnea on exertion. He denies any fevers, chills, or night sweats.  He lost approximately 6 pounds since last being seen.  He is no longer taking Remeron due to lack of benefit.  He states that he has omelettes daily.  The patient denies any chest pain but he states he occasionally feels a "pinching" pain bilaterally in the upper quadrant/lower rib cage that is random and lasts a few seconds before resolving spontaneously.  Denies any nausea, vomiting, diarrhea, or constipation.  Denies any headache.  The patient states that he has cataracts and needs to see an eye doctor due to some blurry vision.  Denies any rashes or skin changes.  He recently had a restaging CT scan performed.  He is here today for evaluation and to review his scan results.  MEDICAL HISTORY: Past Medical History:  Diagnosis Date   Allergy    Anemia    Anginal pain (  [place])    Blood transfusion without reported diagnosis    CAD (coronary artery disease)    BMS to RCA 2007. Low risk nuclear stress 2020.   CAP (community acquired pneumonia) 09/2016   COPD (chronic obstructive pulmonary disease) ([place])    Dyspnea    occasionally   Emphysema of lung ([facility])    GERD  (gastroesophageal reflux disease)    Heart murmur    "I was told I had one when I was a kid"   Hemorrhoids    History of anal fissures    "no surgeries" (10/30/2016)   Hyperlipidemia    Hypertension    lung ca dx'd 08/2018   with mets to bones in [place]   Myocardial infarction [facility])    Peripheral arterial disease ([place])    status post right common iliac artery stenting back in 2007   Seasonal allergies    Tobacco abuse     ALLERGIES:  is allergic to compazine [prochlorperazine].  MEDICATIONS:  Current Outpatient Medications  Medication Sig Dispense Refill   amLODipine (NORVASC) 5 MG tablet TAKE 1 TABLET BY MOUTH DAILY 180 tablet 0   aspirin EC 81 MG tablet Take 81 mg by mouth daily.     clopidogrel (PLAVIX) 75 MG tablet TAKE 1 TABLET BY MOUTH EVERY DAY 90 tablet 1   doxylamine, Sleep, (UNISOM) 25 MG tablet Take 25 mg by mouth at bedtime as needed for sleep.     esomeprazole (NEXIUM) 20 MG capsule Take 40 mg by mouth daily.     fexofenadine (ALLEGRA) 180 MG tablet Take 180 mg by mouth daily.     Fluticasone-Umeclidin-Vilant (TRELEGY ELLIPTA) 100-62.5-25 MCG/ACT AEPB Inhale 1 puff into the lungs daily. 60 each 3   folic acid (FOLVITE) 1 MG tablet Take 1 tablet (1 mg total) by mouth daily.     Magnesium 500 MG TABS Take 1 tablet (500 mg total) by mouth in the morning and at bedtime.     metoprolol succinate (TOPROL-XL) 25 MG 24 hr tablet TAKE 1 TABLET BY MOUTH DAILY 90 tablet 1   mirtazapine (REMERON) 15 MG tablet TAKE 1 TABLET(15 MG) BY MOUTH AT BEDTIME 90 tablet 0   Multiple Vitamin (MULTIVITAMIN WITH MINERALS) TABS tablet Take 1 tablet by mouth daily.     rosuvastatin (CRESTOR) 5 MG tablet TAKE 1 TABLET(5 MG) BY MOUTH EVERY OTHER DAY 45 tablet 1   No current facility-administered medications for this visit.   Facility-Administered Medications Ordered in Other Visits  Medication Dose Route Frequency Provider Last Rate Last Admin   diphenhydrAMINE (BENADRYL) 25 mg capsule              SURGICAL HISTORY:  Past Surgical History:  Procedure Laterality Date   ANKLE SURGERY Left    "rebuilt it"   ANTERIOR CRUCIATE LIGAMENT REPAIR Right    CARDIAC CATHETERIZATION  09/23/2008   Continued medical therapy - may need GI evaluation in addition.   CARDIAC CATHETERIZATION  10/28/2007   Medical therapy recommended.   CARDIAC CATHETERIZATION  11/18/2006   In-stent restenosis RCA  (50% distal edge, 80% segmental mid, and 50-60% segmental proximal). Successful cutting balloon atherectomy using a [id number] cutting balloon. 3 inflations with atherectomy performed on mid and proximal portions resulting in reduction of 80% mid in-stent restenosis to less than 20% residual and 50-60% segmental proximal to less than 20% residual without dissection.   CARDIAC CATHETERIZATION  02/26/2006   Severe stenosis in RCA. Stenting performed using IVUS. 3.5x20 Maverick balloon deployed at [employer]  34sec. Distal stent-a 4x28 Liberte stent-deployed 12atm 48sec, 12atm 31sec, 4atm 19sec. Mid stent-a 4x28 Liberte stent-deployed 14atm 45sec, 14atm 60sec, 14atm 44sec. Proximal stent-4x8 Liberte- 14atm 45sec,14atm 47sec, 16atm 43sec. Severely diseased segment then appeared TIMI-3 flow.   CARDIOVASCULAR STRESS TEST  11/17/2012   No significant ECG changes. Septal perfusion defect is new when complared to study from 2010. Abnormal myocardial perfusion imaging with a basal to mid perfusion suggestive of previous MI.   CAROTID DOPPLER  08/09/2011   Bilateral Bulb/Proximal ICA - demonstrated a mild amount of fibrous plaque without evidence of significant diameter reduction reduction or other vascular abnormality.   CHEST TUBE INSERTION Right 09/02/2018   Procedure: Chest Tube Insertion;  Surgeon: Garner Nash, DO;  Location: [place];  Service: Thoracic;  Laterality: Right;   COLONOSCOPY     2003, 2014   CORONARY ANGIOPLASTY WITH STENT PLACEMENT     FEMORAL ARTERY STENT     INGUINAL HERNIA REPAIR Right    IR IMAGING GUIDED PORT  INSERTION  11/13/2020   KNEE ARTHROSCOPY Right "multiple"   LAPAROSCOPIC APPENDECTOMY N/A 12/07/2018   Procedure: APPENDECTOMY LAPAROSCOPIC;  Surgeon: Coralie Keens, MD;  Location: [facility];  Service: General;  Laterality: N/A;   LOWER EXTREMITY ARTERIAL DOPPLER  01/31/2011   Bilateral ABIs-normal values with no suggestion of arterial insuff to the lower extremities at rest. Right CIA stent-mild amount of nonhemodynamically significant plaque is noted throughout   [place]   "bone-eating tumor"   PERCUTANEOUS STENT INTERVENTION  04/04/2006 & 04/13/2015   a. Right common iliac artery with an 8.0x18 mm Herculink stent deployed at 12 atm. Stenosis was reduced from 80% to 0% with brisk flow. b. I-cast stenting to left common iliac artery   PERIPHERAL VASCULAR CATHETERIZATION N/A 04/13/2015   Procedure: Lower Extremity Angiography;  Surgeon: Lorretta Harp, MD; L-oCIA 75%, 40-50% L-EIA, R-CIA stent patent, s/p 8 mm x 38 mm ICast covered stent>>0% stenosis in [place] Right    TRANSTHORACIC ECHOCARDIOGRAM  11/26/2012   EF not noted. Aortic valve-sclerosis without stenosis, no regurgiation.    UPPER GASTROINTESTINAL ENDOSCOPY     US CAROTID DOPPLER BILATERAL ([place] [facility])  08/09/2011   Bilateral Bulb/Proximal ICAa demonstrated a mild amount of fibrous plaque without evidence of significant diameter reduction or any other vascular abnormality.   VIDEO ASSISTED THORACOSCOPY (VATS)/ LOBECTOMY Right 09/14/2018   Procedure: RIGHT VIDEO ASSISTED THORACOSCOPY (VATS)/ RIGHT UPPER LOBECTOMY;  Surgeon: Melrose Nakayama, MD;  Location: [place];  Service: Thoracic;  Laterality: Right;   VIDEO BRONCHOSCOPY WITH ENDOBRONCHIAL NAVIGATION N/A 09/02/2018   Procedure: VIDEO BRONCHOSCOPY WITH ENDOBRONCHIAL NAVIGATION;  Surgeon: Garner Nash, DO;  Location: [place];  Service: Thoracic;  Laterality: N/A;   VIDEO BRONCHOSCOPY WITH INSERTION OF INTERBRONCHIAL VALVE  (IBV) N/A 07/10/2022   Procedure: VIDEO BRONCHOSCOPY WITH INSERTION OF INTERBRONCHIAL VALVE (IBV);  Surgeon: Melrose Nakayama, MD;  Location[facility];  Service: Thoracic;  Laterality: N/A;   VIDEO BRONCHOSCOPY WITH INSERTION OF INTERBRONCHIAL VALVE (IBV) N/A 09/06/2022   Procedure: VIDEO BRONCHOSCOPY WITH REMOVAL OF INTRABRONCHIAL VALVE (IBV);  Surgeon: Melrose Nakayama, MD;  Location: [facility] OR;  Service: Thoracic;  Laterality: N/A;    REVIEW OF SYSTEMS:   Review of Systems  Constitutional: Positive for fatigue and poor appetite.  Negative for chills, fever and unexpected weight change.  HENT: Positive for occasional nosebleeds approximately once per week.  Negative for mouth sores, nosebleeds,  sore throat and trouble swallowing.   Eyes: Negative for eye problems and icterus.  Respiratory: Positive for dyspnea with minimal exertion.  Negative for cough, hemoptysis, and wheezing.   Cardiovascular: Negative for chest pain and leg swelling.  Gastrointestinal: Negative for constipation, diarrhea, nausea and vomiting.  Genitourinary: Negative for bladder incontinence, difficulty urinating, dysuria, frequency and hematuria.   Musculoskeletal: Negative for back pain, gait problem, neck pain and neck stiffness.  Skin: Negative for itching and rash.  Neurological: Negative for dizziness, extremity weakness, gait problem, headaches, light-headedness and seizures.  Hematological: Negative for adenopathy. Does not bruise easily.  Positive for epistaxis. Psychiatric/Behavioral: Negative for confusion, depression and sleep disturbance. The patient is not nervous/anxious.     PHYSICAL EXAMINATION:  Blood pressure 122/84, pulse 84, temperature (!) 97.4 F (36.3 C), temperature source Oral, resp. rate 16, weight 96 lb 1.6 oz (43.6 kg), SpO2 100 %.  ECOG PERFORMANCE STATUS: 2  Physical Exam  Constitutional: Oriented to person, place, and time and cachectic appearing male and in no distress.  HENT:   Head: Normocephalic and atraumatic.  Mouth/Throat: Oropharynx is clear and moist. No oropharyngeal exudate.  Eyes: Conjunctivae are normal. Right eye exhibits no discharge. Left eye exhibits no discharge. No scleral icterus.  Neck: Normal range of motion. Neck supple.  Cardiovascular: Normal rate, regular rhythm, normal heart sounds and intact distal pulses.   Pulmonary/Chest: Effort normal and breath sounds normal. No respiratory distress. No wheezes. No rales.  Abdominal: Soft. Bowel sounds are normal. Exhibits no distension and no mass. There is no tenderness.  Musculoskeletal: Normal range of motion. Exhibits no edema.  Lymphadenopathy:    No cervical adenopathy.  Neurological: Alert and oriented to person, place, and time. Exhibits muscle wasting.  Examined in the wheelchair. Skin: Skin is warm and dry. No rash noted. Not diaphoretic. No erythema. No pallor.  Psychiatric: Mood, memory and judgment normal.  Vitals reviewed.  LABORATORY DATA: Lab Results  Component Value Date   WBC 11.0 (H) 09/30/2022   HGB 9.7 (L) 09/30/2022   HCT 27.7 (L) 09/30/2022   MCV 88.2 09/30/2022   PLT 549 (H) 09/30/2022      Chemistry      Component Value Date/Time   NA 124 (L) 09/30/2022 1452   NA 130 (L) 03/05/2017 1110   K 3.6 09/30/2022 1452   CL 91 (L) 09/30/2022 1452   CO2 25 09/30/2022 1452   BUN 25 (H) 09/30/2022 1452   BUN 4 (L) 03/05/2017 1110   CREATININE 1.68 (H) 09/30/2022 1452   CREATININE 0.76 04/28/2020 0826      Component Value Date/Time   CALCIUM 9.3 09/30/2022 1452   ALKPHOS 83 09/30/2022 1452   AST 14 (L) 09/30/2022 1452   ALT 9 09/30/2022 1452   BILITOT 0.3 09/30/2022 1452       RADIOGRAPHIC STUDIES:  CT Abdomen Pelvis W Contrast  Result Date: 10/01/2022 CLINICAL DATA:  Staging non-small-cell lung cancer. * Tracking Code: BO * EXAM: CT CHEST, ABDOMEN, AND PELVIS WITH CONTRAST TECHNIQUE: Multidetector CT imaging of the chest, abdomen and pelvis was performed  following the standard protocol during bolus administration of intravenous contrast. RADIATION DOSE REDUCTION: This exam was performed according to the departmental dose-optimization program which includes automated exposure control, adjustment of the mA and/or kV according to patient size and/or use of iterative reconstruction technique. CONTRAST:  176m OMNIPAQUE IOHEXOL 300 MG/ML  SOLN COMPARISON:  CT 09/27/2021. FINDINGS: CT CHEST FINDINGS Cardiovascular: Right upper chest port. The port is accessed.  There are atretic areas along the brachiocephalic vein and SVC. Scattered collateral vessels. The thoracic aorta has diffuse scattered calcified atherosclerotic plaque. Heart is nonenlarged. Trace pericardial fluid. Mediastinum/Nodes: No specific abnormal lymph node enlargement identified in the axillary region or hilum. Small left hilar nodes are identified, nonpathologic by size criteria. Similar. There is a what may be a necrotic lymph node subcarinal towards the right on series 2, image 26 measuring 17 x 10 mm today. Previously, retrospect this is not as well seen without the advantage of contrast and at that time would have measured 16 x 7 mm. The esophagus is diffusely patulous. Subtle areas of wall thickening as well. Small thyroid gland. Lungs/Pleura: Tiny left and small right pleural effusion with areas of pleural thickening and complexity. There calcifications on the left as well. Adjacent lung opacities. There are basilar areas of scarring, fibrotic changes. At the left lung apex is a component of presumed loculated hydropneumothorax. This has a different configuration today than previous. The previous right-sided pneumothorax is improved. There are surgical changes. Underlying emphysematous lung changes. Bulla and bleb formation areas. Musculoskeletal: Osteopenia. Scattered degenerative changes. Stable sclerosis of the left scapula consistent with known metastatic disease. CT ABDOMEN PELVIS FINDINGS  Hepatobiliary: Mild fatty liver infiltration. Patent portal vein. Gallbladder is nondilated. Pancreas: Unremarkable. No pancreatic ductal dilatation or surrounding inflammatory changes. Spleen: Normal in size without focal abnormality. Adrenals/Urinary Tract: Right adrenal gland and left adrenal gland is slightly thickened diffusely unchanged from prior. Mild bilateral renal atrophy. Enhancing renal mass or collecting system dilatation. Small area of calcification along the posterior aspect of the left kidney is stable. No abnormal calcifications seen within either kidney nor along the course of either ureter. Preserved contours of the urinary bladder. Stomach/Bowel: The stomach is dilated with fluid and debris. There are multiple areas of nodular fold thickening particularly along the body and antrum. Please correlate with any known history and recommend further workup when appropriate. Scattered colonic stool. There is normal course and caliber of the small and large bowel. Vascular/Lymphatic: Extensive vascular calcifications identified with multiple areas of significant narrowing along vessels including the iliac vessels, renal arteries, SMA. There is also dilatation of the infrarenal abdominal aorta focally, fusiform measuring up to 2.5 cm but not truly an aneurysm. Bilateral iliac artery stents. Preserved IVC. Retroaortic left renal vein. No clear abnormal lymph node enlargement identified in the abdomen and pelvis. Reproductive: Prostate is unremarkable. Other: Anasarca.  No ascites. Musculoskeletal: Osteopenia and degenerative changes of the spine and pelvis. IMPRESSION: Bilateral pleural effusions identified with adjacent opacities. The effusions have loculated components with pleural thickening. There is also a component presumed loculated small left apical pneumothorax with fluid. This area of the left lung apex is new compared to prior CT scan but was seen on the x-ray of 09/17/2022. Slightly enlarging  possibly centrally necrotic subcarinal lymph node. No other new areas enlargement. Persistent nodular fold thickening along the body and antrum of the stomach. Please correlate for any known history or dedicated workup is recommended. This includes the lesser curve of the stomach. Fatty liver infiltration. Extensive atherosclerotic calcified plaque with areas of stenosis. Iliac stents. Aortic Atherosclerosis (ICD10-I70.0) and Emphysema (ICD10-J43.9). Electronically Signed   By: Jill Side M.D.   On: 10/01/2022 15:42   CT Chest W Contrast  Result Date: 10/01/2022 CLINICAL DATA:  Staging non-small-cell lung cancer. * Tracking Code: BO * EXAM: CT CHEST, ABDOMEN, AND PELVIS WITH CONTRAST TECHNIQUE: Multidetector CT imaging of the chest, abdomen and pelvis  was performed following the standard protocol during bolus administration of intravenous contrast. RADIATION DOSE REDUCTION: This exam was performed according to the departmental dose-optimization program which includes automated exposure control, adjustment of the mA and/or kV according to patient size and/or use of iterative reconstruction technique. CONTRAST:  140m OMNIPAQUE IOHEXOL 300 MG/ML  SOLN COMPARISON:  CT 09/27/2021. FINDINGS: CT CHEST FINDINGS Cardiovascular: Right upper chest port. The port is accessed. There are atretic areas along the brachiocephalic vein and SVC. Scattered collateral vessels. The thoracic aorta has diffuse scattered calcified atherosclerotic plaque. Heart is nonenlarged. Trace pericardial fluid. Mediastinum/Nodes: No specific abnormal lymph node enlargement identified in the axillary region or hilum. Small left hilar nodes are identified, nonpathologic by size criteria. Similar. There is a what may be a necrotic lymph node subcarinal towards the right on series 2, image 26 measuring 17 x 10 mm today. Previously, retrospect this is not as well seen without the advantage of contrast and at that time would have measured 16 x 7 mm.  The esophagus is diffusely patulous. Subtle areas of wall thickening as well. Small thyroid gland. Lungs/Pleura: Tiny left and small right pleural effusion with areas of pleural thickening and complexity. There calcifications on the left as well. Adjacent lung opacities. There are basilar areas of scarring, fibrotic changes. At the left lung apex is a component of presumed loculated hydropneumothorax. This has a different configuration today than previous. The previous right-sided pneumothorax is improved. There are surgical changes. Underlying emphysematous lung changes. Bulla and bleb formation areas. Musculoskeletal: Osteopenia. Scattered degenerative changes. Stable sclerosis of the left scapula consistent with known metastatic disease. CT ABDOMEN PELVIS FINDINGS Hepatobiliary: Mild fatty liver infiltration. Patent portal vein. Gallbladder is nondilated. Pancreas: Unremarkable. No pancreatic ductal dilatation or surrounding inflammatory changes. Spleen: Normal in size without focal abnormality. Adrenals/Urinary Tract: Right adrenal gland and left adrenal gland is slightly thickened diffusely unchanged from prior. Mild bilateral renal atrophy. Enhancing renal mass or collecting system dilatation. Small area of calcification along the posterior aspect of the left kidney is stable. No abnormal calcifications seen within either kidney nor along the course of either ureter. Preserved contours of the urinary bladder. Stomach/Bowel: The stomach is dilated with fluid and debris. There are multiple areas of nodular fold thickening particularly along the body and antrum. Please correlate with any known history and recommend further workup when appropriate. Scattered colonic stool. There is normal course and caliber of the small and large bowel. Vascular/Lymphatic: Extensive vascular calcifications identified with multiple areas of significant narrowing along vessels including the iliac vessels, renal arteries, SMA. There  is also dilatation of the infrarenal abdominal aorta focally, fusiform measuring up to 2.5 cm but not truly an aneurysm. Bilateral iliac artery stents. Preserved IVC. Retroaortic left renal vein. No clear abnormal lymph node enlargement identified in the abdomen and pelvis. Reproductive: Prostate is unremarkable. Other: Anasarca.  No ascites. Musculoskeletal: Osteopenia and degenerative changes of the spine and pelvis. IMPRESSION: Bilateral pleural effusions identified with adjacent opacities. The effusions have loculated components with pleural thickening. There is also a component presumed loculated small left apical pneumothorax with fluid. This area of the left lung apex is new compared to prior CT scan but was seen on the x-ray of 09/17/2022. Slightly enlarging possibly centrally necrotic subcarinal lymph node. No other new areas enlargement. Persistent nodular fold thickening along the body and antrum of the stomach. Please correlate for any known history or dedicated workup is recommended. This includes the lesser curve of the stomach. Fatty  liver infiltration. Extensive atherosclerotic calcified plaque with areas of stenosis. Iliac stents. Aortic Atherosclerosis (ICD10-I70.0) and Emphysema (ICD10-J43.9). Electronically Signed   By: Jill Side M.D.   On: 10/01/2022 15:42   DG Chest 2 View  Result Date: 09/17/2022 CLINICAL DATA:  Surgery several months ago with left-sided pneumothorax EXAM: CHEST - 2 VIEW COMPARISON:  09/06/2022 FINDINGS: Right Port-A-Cath tip superior caval/atrial junction. Midline trachea. Normal heart size. Small right pleural effusion is similar. A left-sided hydropneumothorax is again identified. The pleural air component at the apex is similar to slightly increased. The pleural fluid component at the apex is increased, with a new fluid level. The amount of fluid in the left costophrenic angle is similar. Underlying emphysema with bibasilar scarring. No new pulmonary opacity.  Surgical sutures in the right upper lung. IMPRESSION: Small left hydropneumothorax, loculated at the left apex with similar to slightly increased pleural air component and increased pleural fluid component. Otherwise, similar appearance of emphysema, scarring, and small right pleural effusion. Electronically Signed   By: Abigail Miyamoto M.D.   On: 09/17/2022 11:29     ASSESSMENT/PLAN:  This is a very pleasant 67 year old Caucasian male who was initially diagnosed with stage IIIB non-small cell lung cancer, adenocarcinoma. He is status post a right upper lobectomy with lymph node dissection under the care of [name].  He was negative for any actionable mutations.  He completed adjuvant chemotherapy with cisplatin and Alimta for 4 cycles.  He tolerated this fairly well.  His PD-L1 expression is 20%.    The patient had been on observation until imaging studies in February 2022 showed suspicious bone lesions in the left shoulder area.  A PET scan was performed on 09/25/2020 which showed metastatic disease involving the left scapula and left supraclavicular lymph nodes. The patient had a ultrasound-guided biopsy of the left supraclavicular lymph node and it was positive for metastatic carcinoma.   The patient then underwent palliative radiotherapy to the metastatic bone lesion in the left scapula under the care of Dr. Isidore Moos.  The patient started systemic chemotherapy with carboplatin for AUC of 5, Alimta 500 Mg/M2 and Keytruda 200 Mg IV every 3 weeks status post 12 cycles. Starting from cycle #5 he is treated with maintenance Alimta and Keytruda every 3 weeks. His dose of Alimta was reduced to 400 Mg/M2 starting from cycle #5 because of worsening anemia and Keytruda was also discontinued starting cycle #11 secondary to suspicious immunotherapy induced gastritis. His treatment was discontinued after cycle #12 secondary to intolerance.   The patient has been followed closely by CT surgery, recurrent  pneumothorax is under the care of [name].  Patient recently had a restaging CT scan.  Dr. [name] personally and independently reviewed the scan and discussed results with the patient today.  The scan showed no clear evidence of disease progression.  The scan mentioned a slightly enlarging possibly centrally necrotic subcarinal lymph node which Dr. [name] explained may be secondary to treatment response.  Dr. [name] recommends the patient continue on observation with restaging CT scan in 4 months.  I will order this without contrast due to his renal insufficiency.  Patient continues to have persistent anemia.  We will arrange for him to have ferritin and iron studies drawn today.  We will arrange for IV iron if he has evidence of iron deficiency.  He takes a multivitamin containing iron since he has intolerance to iron supplement secondary to GI upset.  Will arrange for him to have his  Port-A-Cath flush every 6 to 8 weeks.  For his nosebleeds, I encouraged the patient to use saline spray and avoid forceful blowing of the nose.  If he continues to have significant nosebleeds, discussed he may want to reach out to his cardiologist about if he should continue to be on both Plavix and aspirin.  If he continues to have significant nosebleeds, discussed that the next step would be possible referral to ENT to see if anything is amenable to cauterization.  Should he develop any significant nosebleeds that are uncontrolled, advised to seek emergency evaluation.  The patient's sodium was slightly low on labs last week.  He was encouraged to increase his intake of salty foods for the next few days.  He mentions he is considering reaching out to pulmonary medicine to see if there is anything additional they would recommend regarding his dyspnea.   He will follow-up with his eye doctor regarding his blurry vision/cataracts.  The patient was advised to call immediately if he has any concerning  symptoms in the interval. The patient voices understanding of current disease status and treatment options and is in agreement with the current care plan. All questions were answered. The patient knows to call the clinic with any problems, questions or concerns. We can certainly see the patient much sooner if necessary Orders Placed This Encounter  Procedures   CT Chest Wo Contrast    Standing Status:   Future    Standing Expiration Date:   10/07/2023    Order Specific Question:   Preferred imaging location?    Answer:   [facility]   CT Abdomen Pelvis Wo Contrast    Standing Status:   Future    Standing Expiration Date:   10/07/2023    Order Specific Question:   Preferred imaging location?    Answer:   [facility]    Order Specific Question:   Is Oral Contrast requested for this exam?    Answer:   Yes, Per Radiology protocol    Order Specific Question:   Does the patient have a contrast media/X-ray dye allergy?    Answer:   No   Iron and Iron Binding Capacity ([facility] only)    Standing Status:   Future    Standing Expiration Date:   10/07/2023   Ferritin    Standing Status:   Future    Standing Expiration Date:   10/07/2023   CBC with Differential ([place] Only)    Standing Status:   Future    Standing Expiration Date:   10/07/2023   CMP ([place] only)    Standing Status:   Future    Standing Expiration Date:   10/07/2023      Tobe Sos Brittay Mogle, PA-C 10/07/22  ADDENDUM: Hematology/Oncology Attending: I had a face to face encounter with the patient today.  I reviewed his records, lab, scan and recommended his care plan.  This is a very pleasant 67 years old white male with metastatic non-small cell lung cancer, adenocarcinoma that was initially diagnosed as stage IIIa in February 2020 but has disease recurrence in March 2022 with PD-L1 expression of 20% and no actionable mutations.  He is status post right upper lobectomy with lymph node  dissection followed by adjuvant systemic chemotherapy followed by palliative radiotherapy to the recurrent metastatic disease.  The patient was then treated with systemic chemotherapy with carboplatin, Alimta and Keytruda initially for 4 cycles followed by 8 more cycles of maintenance therapy discontinued secondary to  intolerance. The patient is currently on observation and he had frequent follow-up visit and evaluation by [name] for possible pneumothorax and pneumomediastinum with recurrent pleural effusion.  He had a endobronchial valve placement for a while and then removal on 09/06/2022.  He also had a chest tube placement followed by talc pleurodesis. The patient is currently on observation and he is feeling well except for the fatigue and lack of stamina. He continues to have persistent iron deficiency anemia.  Will check his iron study and consider The patient for iron infusion if needed. He had repeat CT scan of the chest, abdomen and pelvis performed recently.  I personally and independently reviewed the scan images and discussed the results with the patient and his wife. His scan showed no concerning findings for disease recurrence or metastasis except for slightly larger than necrotic subcarinal lymph node. I recommended for the patient to continue on observation for now with repeat CT scan of the chest, abdomen and pelvis in 4 months. The patient was advised to call immediately if he has any other concerning symptoms in the interval. The total time spent in the appointment was 30 minutes. Disclaimer: This note was dictated with voice recognition software. Similar sounding words can inadvertently be transcribed and may be missed upon review. Eilleen Kempf, MD

## 2022-10-07 ENCOUNTER — Other Ambulatory Visit: Payer: Self-pay | Admitting: Physician Assistant

## 2022-10-07 ENCOUNTER — Inpatient Hospital Stay: Payer: BC Managed Care – PPO | Admitting: Physician Assistant

## 2022-10-07 ENCOUNTER — Other Ambulatory Visit: Payer: Self-pay

## 2022-10-07 ENCOUNTER — Inpatient Hospital Stay (HOSPITAL_BASED_OUTPATIENT_CLINIC_OR_DEPARTMENT_OTHER): Payer: BC Managed Care – PPO | Admitting: Physician Assistant

## 2022-10-07 ENCOUNTER — Ambulatory Visit: Payer: BC Managed Care – PPO

## 2022-10-07 VITALS — BP 122/84 | HR 84 | Temp 97.4°F | Resp 16 | Wt 96.1 lb

## 2022-10-07 DIAGNOSIS — Z7902 Long term (current) use of antithrombotics/antiplatelets: Secondary | ICD-10-CM | POA: Diagnosis not present

## 2022-10-07 DIAGNOSIS — N289 Disorder of kidney and ureter, unspecified: Secondary | ICD-10-CM | POA: Diagnosis not present

## 2022-10-07 DIAGNOSIS — D500 Iron deficiency anemia secondary to blood loss (chronic): Secondary | ICD-10-CM

## 2022-10-07 DIAGNOSIS — C3491 Malignant neoplasm of unspecified part of right bronchus or lung: Secondary | ICD-10-CM | POA: Diagnosis not present

## 2022-10-07 DIAGNOSIS — C7951 Secondary malignant neoplasm of bone: Secondary | ICD-10-CM | POA: Diagnosis not present

## 2022-10-07 DIAGNOSIS — D509 Iron deficiency anemia, unspecified: Secondary | ICD-10-CM | POA: Diagnosis not present

## 2022-10-07 DIAGNOSIS — C3411 Malignant neoplasm of upper lobe, right bronchus or lung: Secondary | ICD-10-CM | POA: Diagnosis not present

## 2022-10-07 DIAGNOSIS — Z7982 Long term (current) use of aspirin: Secondary | ICD-10-CM | POA: Diagnosis not present

## 2022-10-07 DIAGNOSIS — H269 Unspecified cataract: Secondary | ICD-10-CM | POA: Diagnosis not present

## 2022-10-07 DIAGNOSIS — Z95828 Presence of other vascular implants and grafts: Secondary | ICD-10-CM

## 2022-10-07 DIAGNOSIS — R0600 Dyspnea, unspecified: Secondary | ICD-10-CM | POA: Diagnosis not present

## 2022-10-07 DIAGNOSIS — D649 Anemia, unspecified: Secondary | ICD-10-CM | POA: Diagnosis not present

## 2022-10-07 DIAGNOSIS — R040 Epistaxis: Secondary | ICD-10-CM | POA: Diagnosis not present

## 2022-10-07 DIAGNOSIS — H538 Other visual disturbances: Secondary | ICD-10-CM | POA: Diagnosis not present

## 2022-10-07 DIAGNOSIS — Z902 Acquired absence of lung [part of]: Secondary | ICD-10-CM | POA: Diagnosis not present

## 2022-10-07 LAB — IRON AND IRON BINDING CAPACITY (CC-WL,HP ONLY)
Iron: 27 ug/dL — ABNORMAL LOW (ref 45–182)
Saturation Ratios: 11 % — ABNORMAL LOW (ref 17.9–39.5)
TIBC: 244 ug/dL — ABNORMAL LOW (ref 250–450)
UIBC: 217 ug/dL (ref 117–376)

## 2022-10-07 LAB — FERRITIN: Ferritin: 929 ng/mL — ABNORMAL HIGH (ref 24–336)

## 2022-10-07 MED ORDER — HEPARIN SOD (PORK) LOCK FLUSH 100 UNIT/ML IV SOLN
500.0000 [IU] | Freq: Once | INTRAVENOUS | Status: AC
  Administered 2022-10-07: 500 [IU]

## 2022-10-07 MED ORDER — SODIUM CHLORIDE 0.9% FLUSH
10.0000 mL | Freq: Once | INTRAVENOUS | Status: AC
  Administered 2022-10-07: 10 mL

## 2022-10-07 NOTE — Progress Notes (Signed)
I called the patient to let him know we were ordering IV iron infusions. I will send scheduling message.

## 2022-10-08 ENCOUNTER — Telehealth: Payer: Self-pay | Admitting: Adult Health

## 2022-10-08 NOTE — Telephone Encounter (Signed)
Per 3/11 IB reached out to patient to schedule Feraheme, left voicemail for patient.

## 2022-10-15 ENCOUNTER — Other Ambulatory Visit: Payer: Self-pay

## 2022-10-15 ENCOUNTER — Inpatient Hospital Stay: Payer: BC Managed Care – PPO

## 2022-10-15 VITALS — BP 113/85 | HR 65 | Temp 97.8°F | Resp 17

## 2022-10-15 DIAGNOSIS — Z95828 Presence of other vascular implants and grafts: Secondary | ICD-10-CM

## 2022-10-15 DIAGNOSIS — R040 Epistaxis: Secondary | ICD-10-CM | POA: Diagnosis not present

## 2022-10-15 DIAGNOSIS — D509 Iron deficiency anemia, unspecified: Secondary | ICD-10-CM | POA: Diagnosis not present

## 2022-10-15 DIAGNOSIS — Z902 Acquired absence of lung [part of]: Secondary | ICD-10-CM | POA: Diagnosis not present

## 2022-10-15 DIAGNOSIS — H538 Other visual disturbances: Secondary | ICD-10-CM | POA: Diagnosis not present

## 2022-10-15 DIAGNOSIS — R0600 Dyspnea, unspecified: Secondary | ICD-10-CM | POA: Diagnosis not present

## 2022-10-15 DIAGNOSIS — D500 Iron deficiency anemia secondary to blood loss (chronic): Secondary | ICD-10-CM

## 2022-10-15 DIAGNOSIS — H269 Unspecified cataract: Secondary | ICD-10-CM | POA: Diagnosis not present

## 2022-10-15 DIAGNOSIS — C7951 Secondary malignant neoplasm of bone: Secondary | ICD-10-CM | POA: Diagnosis not present

## 2022-10-15 DIAGNOSIS — Z7902 Long term (current) use of antithrombotics/antiplatelets: Secondary | ICD-10-CM | POA: Diagnosis not present

## 2022-10-15 DIAGNOSIS — C3411 Malignant neoplasm of upper lobe, right bronchus or lung: Secondary | ICD-10-CM | POA: Diagnosis not present

## 2022-10-15 DIAGNOSIS — N289 Disorder of kidney and ureter, unspecified: Secondary | ICD-10-CM | POA: Diagnosis not present

## 2022-10-15 DIAGNOSIS — Z7982 Long term (current) use of aspirin: Secondary | ICD-10-CM | POA: Diagnosis not present

## 2022-10-15 MED ORDER — SODIUM CHLORIDE 0.9% FLUSH
10.0000 mL | Freq: Once | INTRAVENOUS | Status: AC | PRN
  Administered 2022-10-15: 10 mL

## 2022-10-15 MED ORDER — ACETAMINOPHEN 325 MG PO TABS
650.0000 mg | ORAL_TABLET | Freq: Once | ORAL | Status: AC
  Administered 2022-10-15: 650 mg via ORAL
  Filled 2022-10-15: qty 2

## 2022-10-15 MED ORDER — HEPARIN SOD (PORK) LOCK FLUSH 100 UNIT/ML IV SOLN
500.0000 [IU] | Freq: Once | INTRAVENOUS | Status: AC | PRN
  Administered 2022-10-15: 500 [IU]

## 2022-10-15 MED ORDER — SODIUM CHLORIDE 0.9 % IV SOLN
510.0000 mg | Freq: Once | INTRAVENOUS | Status: AC
  Administered 2022-10-15: 510 mg via INTRAVENOUS
  Filled 2022-10-15: qty 510

## 2022-10-15 MED ORDER — LORATADINE 10 MG PO TABS
10.0000 mg | ORAL_TABLET | Freq: Once | ORAL | Status: AC
  Administered 2022-10-15: 10 mg via ORAL
  Filled 2022-10-15: qty 1

## 2022-10-15 MED ORDER — SODIUM CHLORIDE 0.9 % IV SOLN: Freq: Once | INTRAVENOUS | Status: AC

## 2022-10-22 ENCOUNTER — Other Ambulatory Visit: Payer: Self-pay

## 2022-10-22 ENCOUNTER — Inpatient Hospital Stay: Payer: BC Managed Care – PPO

## 2022-10-22 ENCOUNTER — Other Ambulatory Visit: Payer: Self-pay | Admitting: Adult Health

## 2022-10-22 VITALS — BP 141/84 | HR 66 | Temp 97.6°F | Resp 18

## 2022-10-22 DIAGNOSIS — Z7902 Long term (current) use of antithrombotics/antiplatelets: Secondary | ICD-10-CM | POA: Diagnosis not present

## 2022-10-22 DIAGNOSIS — Z902 Acquired absence of lung [part of]: Secondary | ICD-10-CM | POA: Diagnosis not present

## 2022-10-22 DIAGNOSIS — Z7982 Long term (current) use of aspirin: Secondary | ICD-10-CM | POA: Diagnosis not present

## 2022-10-22 DIAGNOSIS — N289 Disorder of kidney and ureter, unspecified: Secondary | ICD-10-CM | POA: Diagnosis not present

## 2022-10-22 DIAGNOSIS — R0600 Dyspnea, unspecified: Secondary | ICD-10-CM | POA: Diagnosis not present

## 2022-10-22 DIAGNOSIS — H538 Other visual disturbances: Secondary | ICD-10-CM | POA: Diagnosis not present

## 2022-10-22 DIAGNOSIS — Z95828 Presence of other vascular implants and grafts: Secondary | ICD-10-CM

## 2022-10-22 DIAGNOSIS — C7951 Secondary malignant neoplasm of bone: Secondary | ICD-10-CM | POA: Diagnosis not present

## 2022-10-22 DIAGNOSIS — H269 Unspecified cataract: Secondary | ICD-10-CM | POA: Diagnosis not present

## 2022-10-22 DIAGNOSIS — D500 Iron deficiency anemia secondary to blood loss (chronic): Secondary | ICD-10-CM

## 2022-10-22 DIAGNOSIS — C3411 Malignant neoplasm of upper lobe, right bronchus or lung: Secondary | ICD-10-CM | POA: Diagnosis not present

## 2022-10-22 DIAGNOSIS — D509 Iron deficiency anemia, unspecified: Secondary | ICD-10-CM | POA: Diagnosis not present

## 2022-10-22 DIAGNOSIS — R040 Epistaxis: Secondary | ICD-10-CM | POA: Diagnosis not present

## 2022-10-22 MED ORDER — ACETAMINOPHEN 325 MG PO TABS
650.0000 mg | ORAL_TABLET | Freq: Once | ORAL | Status: AC
  Administered 2022-10-22: 650 mg via ORAL
  Filled 2022-10-22: qty 2

## 2022-10-22 MED ORDER — SODIUM CHLORIDE 0.9% FLUSH
10.0000 mL | Freq: Once | INTRAVENOUS | Status: AC | PRN
  Administered 2022-10-22: 10 mL

## 2022-10-22 MED ORDER — SODIUM CHLORIDE 0.9 % IV SOLN
510.0000 mg | Freq: Once | INTRAVENOUS | Status: AC
  Administered 2022-10-22: 510 mg via INTRAVENOUS
  Filled 2022-10-22: qty 510

## 2022-10-22 MED ORDER — HEPARIN SOD (PORK) LOCK FLUSH 100 UNIT/ML IV SOLN
500.0000 [IU] | Freq: Once | INTRAVENOUS | Status: AC | PRN
  Administered 2022-10-22: 500 [IU]

## 2022-10-22 MED ORDER — SODIUM CHLORIDE 0.9 % IV SOLN: Freq: Once | INTRAVENOUS | Status: AC

## 2022-10-22 MED ORDER — LORATADINE 10 MG PO TABS
10.0000 mg | ORAL_TABLET | Freq: Once | ORAL | Status: AC
  Administered 2022-10-22: 10 mg via ORAL
  Filled 2022-10-22: qty 1

## 2022-10-22 NOTE — Progress Notes (Signed)
Pt declined to stay for 95m post obvs period. Pt tolerated tx well w/o incident. VSS at discharge. This RN assisted pt to lobby.

## 2022-10-22 NOTE — Patient Instructions (Signed)

## 2022-11-06 ENCOUNTER — Other Ambulatory Visit: Payer: Self-pay | Admitting: Cardiovascular Disease

## 2022-11-13 ENCOUNTER — Telehealth: Payer: Self-pay | Admitting: Internal Medicine

## 2022-11-20 ENCOUNTER — Encounter: Payer: Self-pay | Admitting: Adult Health

## 2022-11-20 ENCOUNTER — Telehealth: Payer: Self-pay | Admitting: Adult Health

## 2022-11-20 ENCOUNTER — Ambulatory Visit (INDEPENDENT_AMBULATORY_CARE_PROVIDER_SITE_OTHER): Payer: BC Managed Care – PPO

## 2022-11-20 ENCOUNTER — Ambulatory Visit (INDEPENDENT_AMBULATORY_CARE_PROVIDER_SITE_OTHER): Payer: BC Managed Care – PPO | Admitting: Adult Health

## 2022-11-20 VITALS — BP 120/90 | HR 82 | Temp 97.5°F | Ht 68.0 in

## 2022-11-20 DIAGNOSIS — M25511 Pain in right shoulder: Secondary | ICD-10-CM | POA: Diagnosis not present

## 2022-11-20 MED ORDER — OXYCODONE HCL 7.5 MG PO TABS
7.5000 mg | ORAL_TABLET | Freq: Every day | ORAL | 0 refills | Status: DC | PRN
Start: 2022-11-20 — End: 2022-11-21

## 2022-11-20 MED ORDER — DICLOFENAC SODIUM 1 % EX GEL: 2.0000 g | Freq: Four times a day (QID) | CUTANEOUS | 3 refills | Status: DC

## 2022-11-20 NOTE — Telephone Encounter (Signed)
Says prescription for oxyCODONE HCl 7.5 MG TABS 20 tablets is around $400. Wonders if they need a PA or if there is a more cost effective substitution

## 2022-11-20 NOTE — Progress Notes (Signed)
Subjective:    Patient ID: Sean Rose, male    DOB: [birth year], 67 y.o.   MRN: [id number]  HPI 67 year old male who  has a past medical history of Allergy, Anemia, Anginal pain, Blood transfusion without reported diagnosis, CAD (coronary artery disease), CAP (community acquired pneumonia) (09/2016), COPD (chronic obstructive pulmonary disease), Dyspnea, Emphysema of lung, GERD (gastroesophageal reflux disease), Heart murmur, Hemorrhoids, History of anal fissures, Hyperlipidemia, Hypertension, lung ca (dx'd 08/2018), Myocardial infarction, Peripheral arterial disease, Seasonal allergies, and Tobacco abuse.  He presents to the office today for right shoulder pain that has been present for 3 weeks. He denies trauma or aggravating injury. Pain is described as an ache and feels like it is coming from " inside the shoulder" He does have metastatic disease to the left scapula that was noted on CT in March 2024. At home he has been using left over oxycodone 5 mg which "dulls the pain."   He denies any trauma or falls. Has no loss of ROM. He would like to have an xray to " see if the cancer has spread to my right shoulder"    Review of Systems See HPI   Past Medical History:  Diagnosis Date   Allergy    Anemia    Anginal pain    Blood transfusion without reported diagnosis    CAD (coronary artery disease)    BMS to RCA 2007. Low risk nuclear stress 2020.   CAP (community acquired pneumonia) 09/2016   COPD (chronic obstructive pulmonary disease)    Dyspnea    occasionally   Emphysema of lung    GERD (gastroesophageal reflux disease)    Heart murmur    "I was told I had one when I was a kid"   Hemorrhoids    History of anal fissures    "no surgeries" (10/30/2016)   Hyperlipidemia    Hypertension    lung ca dx'd 08/2018   with mets to bones in arms   Myocardial infarction    Peripheral arterial disease    status post right common iliac artery stenting back in 2007   Seasonal allergies     Tobacco abuse     Social History   Socioeconomic History   Marital status: Married    Spouse name: Not on file   Number of children: 2   Years of education: Not on file   Highest education level: Not on file  Occupational History   Occupation: retired  Tobacco Use   Smoking status: Former    Packs/day: 0.25    Years: 38.00    Additional pack years: 0.00    Total pack years: 9.50    Types: Cigarettes    Quit date: 10/19/2018    Years since quitting: 4.0   Smokeless tobacco: Never   Tobacco comments:    pack per day 12.17.19  Vaping Use   Vaping Use: Former  Substance and Sexual Activity   Alcohol use: Not Currently    Comment: socially   Drug use: No   Sexual activity: Yes  Other Topics Concern   Not on file  Social History Narrative   Patient is married with 2 children   Former smoker no tobacco now no drug use occasional/social alcohol   Social Determinants of Health   Financial Resource Strain: Not on file  Food Insecurity: No Food Insecurity (07/04/2022)   Hunger Vital Sign    Worried About Running Out of Food in the Last Year: Never true  Ran Out of Food in the Last Year: Never true  Transportation Needs: No Transportation Needs (07/04/2022)   PRAPARE - Administrator, Civil Service (Medical): No    Lack of Transportation (Non-Medical): No  Physical Activity: Not on file  Stress: Not on file  Social Connections: Not on file  Intimate Partner Violence: Not At Risk (07/04/2022)   Humiliation, Afraid, Rape, and Kick questionnaire    Fear of Current or Ex-Partner: No    Emotionally Abused: No    Physically Abused: No    Sexually Abused: No    Past Surgical History:  Procedure Laterality Date   ANKLE SURGERY Left    "rebuilt it"   ANTERIOR CRUCIATE LIGAMENT REPAIR Right    CARDIAC CATHETERIZATION  09/23/2008   Continued medical therapy - may need GI evaluation in addition.   CARDIAC CATHETERIZATION  10/28/2007   Medical therapy recommended.    CARDIAC CATHETERIZATION  11/18/2006   In-stent restenosis RCA  (50% distal edge, 80% segmental mid, and 50-60% segmental proximal). Successful cutting balloon atherectomy using a [id number] cutting balloon. 3 inflations with atherectomy performed on mid and proximal portions resulting in reduction of 80% mid in-stent restenosis to less than 20% residual and 50-60% segmental proximal to less than 20% residual without dissection.   CARDIAC CATHETERIZATION  02/26/2006   Severe stenosis in RCA. Stenting performed using IVUS. 3.5x20 Maverick balloon deployed at [employer]. Distal stent-a 4x28 Liberte stent-deployed 12atm 48sec, 12atm 31sec, 4atm 19sec. Mid stent-a 4x28 Liberte stent-deployed 14atm 45sec, 14atm 60sec, 14atm 44sec. Proximal stent-4x8 Liberte- 14atm 45sec,14atm 47sec, 16atm 43sec. Severely diseased segment then appeared TIMI-3 flow.   CARDIOVASCULAR STRESS TEST  11/17/2012   No significant ECG changes. Septal perfusion defect is new when complared to study from 2010. Abnormal myocardial perfusion imaging with a basal to mid perfusion suggestive of previous MI.   CAROTID DOPPLER  08/09/2011   Bilateral Bulb/Proximal ICA - demonstrated a mild amount of fibrous plaque without evidence of significant diameter reduction reduction or other vascular abnormality.   CHEST TUBE INSERTION Right 09/02/2018   Procedure: Chest Tube Insertion;  Surgeon: Josephine Igo, DO;  Location: [facility] OR;  Service: Thoracic;  Laterality: Right;   COLONOSCOPY     2003, 2014   CORONARY ANGIOPLASTY WITH STENT PLACEMENT     FEMORAL ARTERY STENT     INGUINAL HERNIA REPAIR Right    IR IMAGING GUIDED PORT INSERTION  11/13/2020   KNEE ARTHROSCOPY Right "multiple"   LAPAROSCOPIC APPENDECTOMY N/A 12/07/2018   Procedure: APPENDECTOMY LAPAROSCOPIC;  Surgeon: Abigail Miyamoto, MD;  Location: [facility];  Service: General;  Laterality: N/A;   LOWER EXTREMITY ARTERIAL DOPPLER  01/31/2011   Bilateral ABIs-normal values with no suggestion of  arterial insuff to the lower extremities at rest. 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 Right common iliac artery with an 8.0x18 mm Herculink stent deployed at 12 atm. Stenosis was reduced from 80% to 0% with brisk flow. b. I-cast stenting to left common iliac artery   PERIPHERAL VASCULAR CATHETERIZATION N/A 04/13/2015   Procedure: Lower Extremity Angiography;  Surgeon: Runell Gess, MD; L-oCIA 75%, 40-50% L-EIA, R-CIA stent patent, s/p 8 mm x 38 mm ICast covered stent>>0% stenosis in L-oCIA      SHOULDER ARTHROSCOPY WITH ROTATOR CUFF REPAIR Right    TRANSTHORACIC ECHOCARDIOGRAM  11/26/2012  EF not noted. Aortic valve-sclerosis without stenosis, no regurgiation.    UPPER GASTROINTESTINAL ENDOSCOPY     US CAROTID DOPPLER BILATERAL ([facility] HX)  08/09/2011   Bilateral Bulb/Proximal ICAa demonstrated a mild amount of fibrous plaque without evidence of significant diameter reduction or any other vascular abnormality.   VIDEO ASSISTED THORACOSCOPY (VATS)/ LOBECTOMY Right 09/14/2018   Procedure: RIGHT VIDEO ASSISTED THORACOSCOPY (VATS)/ RIGHT UPPER LOBECTOMY;  Surgeon: Loreli Slot, MD;  Location: [facility] OR;  Service: Thoracic;  Laterality: Right;   VIDEO BRONCHOSCOPY WITH ENDOBRONCHIAL NAVIGATION N/A 09/02/2018   Procedure: VIDEO BRONCHOSCOPY WITH ENDOBRONCHIAL NAVIGATION;  Surgeon: Josephine Igo, DO;  Location: [facility] OR;  Service: Thoracic;  Laterality: N/A;   VIDEO BRONCHOSCOPY WITH INSERTION OF INTERBRONCHIAL VALVE (IBV) N/A 07/10/2022   Procedure: VIDEO BRONCHOSCOPY WITH INSERTION OF INTERBRONCHIAL VALVE (IBV);  Surgeon: Loreli Slot, MD;  Location[facility] OR;  Service: Thoracic;  Laterality: N/A;   VIDEO BRONCHOSCOPY WITH INSERTION OF INTERBRONCHIAL VALVE (IBV) N/A 09/06/2022   Procedure: VIDEO BRONCHOSCOPY WITH REMOVAL OF  INTRABRONCHIAL VALVE (IBV);  Surgeon: Loreli Slot, MD;  Location: [facility] OR;  Service: Thoracic;  Laterality: N/A;    Family History  Problem Relation Age of Onset   Colon cancer Mother    Heart disease Father    Heart disease Paternal Grandfather    Rectal cancer Other    Esophageal cancer Neg Hx    Stomach cancer Neg Hx     Allergies  Allergen Reactions   Compazine [Prochlorperazine] Other (See Comments)    " made him high and could not sleep". He does not want to take it again.    Current Outpatient Medications on File Prior to Visit  Medication Sig Dispense Refill   amLODipine (NORVASC) 5 MG tablet TAKE 1 TABLET BY MOUTH DAILY 180 tablet 0   aspirin EC 81 MG tablet Take 81 mg by mouth daily.     clopidogrel (PLAVIX) 75 MG tablet TAKE 1 TABLET BY MOUTH EVERY DAY 90 tablet 1   doxylamine, Sleep, (UNISOM) 25 MG tablet Take 25 mg by mouth at bedtime as needed for sleep.     esomeprazole (NEXIUM) 20 MG capsule Take 40 mg by mouth daily.     fexofenadine (ALLEGRA) 180 MG tablet Take 180 mg by mouth daily.     folic acid (FOLVITE) 1 MG tablet Take 1 tablet (1 mg total) by mouth daily.     Magnesium 500 MG TABS Take 1 tablet (500 mg total) by mouth in the morning and at bedtime.     metoprolol succinate (TOPROL-XL) 25 MG 24 hr tablet TAKE 1 TABLET BY MOUTH DAILY 90 tablet 1   mirtazapine (REMERON) 15 MG tablet TAKE 1 TABLET(15 MG) BY MOUTH AT BEDTIME 90 tablet 0   Multiple Vitamin (MULTIVITAMIN WITH MINERALS) TABS tablet Take 1 tablet by mouth daily.     rosuvastatin (CRESTOR) 5 MG tablet TAKE 1 TABLET(5 MG) BY MOUTH EVERY OTHER DAY 45 tablet 1   TRELEGY ELLIPTA 100-62.5-25 MCG/ACT AEPB INHALE 1 PUFF INTO THE LUNGS DAILY 60 each 3   Current Facility-Administered Medications on File Prior to Visit  Medication Dose Route Frequency Provider Last Rate Last Admin   diphenhydrAMINE (BENADRYL) 25 mg capsule             BP (!) 120/90   Pulse 82   Temp (!) 97.5 F (36.4 C) (Oral)    Ht  (1.727 m)   SpO2 95%   BMI 14.61 kg/m  Objective:   Physical Exam Vitals and nursing note reviewed.  Constitutional:      Appearance: Normal appearance. He is cachectic.  Musculoskeletal:        General: Tenderness present. No swelling or deformity. Normal range of motion.  Skin:    General: Skin is warm and dry.     Capillary Refill: Capillary refill takes less than 2 seconds.  Neurological:     General: No focal deficit present.     Mental Status: He is alert and oriented to person, place, and time.     Motor: Weakness present.  Psychiatric:        Mood and Affect: Mood normal.        Behavior: Behavior normal.        Thought Content: Thought content normal.        Judgment: Judgment normal.       Assessment & Plan:  1. Acute pain of right shoulder - Will xray shoulder today. Send in Oxycodone 7.5 mg daily PRN and Voltaren gel  - DG Shoulder Right; Future - diclofenac Sodium (VOLTAREN) 1 % GEL; Apply 2 g topically 4 (four) times daily.  Dispense: 150 g; Refill: 3 - oxyCODONE HCl 7.5 MG TABS; Take 7.5 mg by mouth daily as needed.  Dispense: 20 tablet; Refill: 0  [name], NP

## 2022-11-21 ENCOUNTER — Other Ambulatory Visit: Payer: Self-pay | Admitting: Adult Health

## 2022-11-21 MED ORDER — OXYCODONE HCL 10 MG PO TABS: 10.0000 mg | ORAL_TABLET | Freq: Every day | ORAL | 0 refills | Status: DC | PRN

## 2022-11-21 NOTE — Telephone Encounter (Signed)
Spoke to pharmacy and they stated insurance will not cover medication. Pharmacist stated its due to the dosage. She stated that it may be covered under the  or 5 mg dosage.

## 2022-11-21 NOTE — Telephone Encounter (Signed)
Patient notified of update  and verbalized understanding. Pt also informed that Voltaren would not be covered bc it can be purchased OTC. Pt verbalized understanding.

## 2022-11-27 ENCOUNTER — Ambulatory Visit (INDEPENDENT_AMBULATORY_CARE_PROVIDER_SITE_OTHER): Payer: BC Managed Care – PPO | Admitting: Adult Health

## 2022-11-27 ENCOUNTER — Encounter: Payer: Self-pay | Admitting: Adult Health

## 2022-11-27 VITALS — BP 110/60 | HR 73 | Temp 98.0°F

## 2022-11-27 DIAGNOSIS — M25511 Pain in right shoulder: Secondary | ICD-10-CM | POA: Diagnosis not present

## 2022-11-27 MED ORDER — METHYLPREDNISOLONE 4 MG PO TBPK
ORAL_TABLET | ORAL | 0 refills | Status: DC
Start: 2022-11-27 — End: 2022-12-04

## 2022-11-27 MED ORDER — CYCLOBENZAPRINE HCL 10 MG PO TABS
10.0000 mg | ORAL_TABLET | Freq: Three times a day (TID) | ORAL | 0 refills | Status: DC
Start: 2022-11-27 — End: 2023-01-03

## 2022-11-27 NOTE — Progress Notes (Signed)
Subjective:    Patient ID: Sean Rose, male    DOB: [birth year], 67 y.o.   MRN: [id number]  HPI 67 year old male who  has a past medical history of Allergy, Anemia, Anginal pain (HCC), Blood transfusion without reported diagnosis, CAD (coronary artery disease), CAP (community acquired pneumonia) (09/2016), COPD (chronic obstructive pulmonary disease) (HCC), Dyspnea, Emphysema of lung (HCC), GERD (gastroesophageal reflux disease), Heart murmur, Hemorrhoids, History of anal fissures, Hyperlipidemia, Hypertension, lung ca (dx'd 08/2018), Myocardial infarction [facility]), Peripheral arterial disease (HCC), Seasonal allergies, and Tobacco abuse.  He presents to the office today for follow-up regarding acute pain of the right shoulder.  He was last seen roughly a week ago which time shoulder pain has been present for roughly 3 weeks.  He did not have any trauma or aggravating injury.  He has known metastatic disease to the left scapula and was worried that this was what was progressing to the right shoulder as well.  He was taking oxycodone 5 mg which was not really helping alleviate any of his pain.  His x-ray did not show any cause for right shoulder pain.   Sent in a short prescription for oxycodone 10   Today he reports that he only took one tab of the 10 mg of oxycodone as he did not like the way it made him feel.   He continues to have pain throughout his right shoulder. Has no loss of ROM but has pain with ROM.    Review of Systems See HPI   Past Medical History:  Diagnosis Date   Allergy    Anemia    Anginal pain (HCC)    Blood transfusion without reported diagnosis    CAD (coronary artery disease)    BMS to RCA 2007. Low risk nuclear stress 2020.   CAP (community acquired pneumonia) 09/2016   COPD (chronic obstructive pulmonary disease) (HCC)    Dyspnea    occasionally   Emphysema of lung (HCC)    GERD (gastroesophageal reflux disease)    Heart murmur    "I was told I had one  when I was a kid"   Hemorrhoids    History of anal fissures    "no surgeries" (10/30/2016)   Hyperlipidemia    Hypertension    lung ca dx'd 08/2018   with mets to bones in arms   Myocardial infarction [facility])    Peripheral arterial disease (HCC)    status post right common iliac artery stenting back in 2007   Seasonal allergies    Tobacco abuse     Social History   Socioeconomic History   Marital status: Married    Spouse name: Not on file   Number of children: 2   Years of education: Not on file   Highest education level: Not on file  Occupational History   Occupation: retired  Tobacco Use   Smoking status: Former    Packs/day: 0.25    Years: 38.00    Additional pack years: 0.00    Total pack years: 9.50    Types: Cigarettes    Quit date: 10/19/2018    Years since quitting: 4.1   Smokeless tobacco: Never   Tobacco comments:    pack per day 12.17.19  Vaping Use   Vaping Use: Former  Substance and Sexual Activity   Alcohol use: Not Currently    Comment: socially   Drug use: No   Sexual activity: Yes  Other Topics Concern   Not on file  Social History Narrative   Patient is married with 2 children   Former smoker no tobacco now no drug use occasional/social alcohol   Social Determinants of Corporate investment banker Strain: Not on file  Food Insecurity: No Food Insecurity (07/04/2022)   Hunger Vital Sign    Worried About Running Out of Food in the Last Year: Never true    Ran Out of Food in the Last Year: Never true  Transportation Needs: No Transportation Needs (07/04/2022)   PRAPARE - Administrator, Civil Service (Medical): No    Lack of Transportation (Non-Medical): No  Physical Activity: Not on file  Stress: Not on file  Social Connections: Not on file  Intimate Partner Violence: Not At Risk (07/04/2022)   Humiliation, Afraid, Rape, and Kick questionnaire    Fear of Current or Ex-Partner: No    Emotionally Abused: No    Physically Abused: No     Sexually Abused: No    Past Surgical History:  Procedure Laterality Date   ANKLE SURGERY Left    "rebuilt it"   ANTERIOR CRUCIATE LIGAMENT REPAIR Right    CARDIAC CATHETERIZATION  09/23/2008   Continued medical therapy - may need GI evaluation in addition.   CARDIAC CATHETERIZATION  10/28/2007   Medical therapy recommended.   CARDIAC CATHETERIZATION  11/18/2006   In-stent restenosis RCA  (50% distal edge, 80% segmental mid, and 50-60% segmental proximal). Successful cutting balloon atherectomy using a [id number] cutting balloon. 3 inflations with atherectomy performed on mid and proximal portions resulting in reduction of 80% mid in-stent restenosis to less than 20% residual and 50-60% segmental proximal to less than 20% residual without dissection.   CARDIAC CATHETERIZATION  02/26/2006   Severe stenosis in RCA. Stenting performed using IVUS. 3.5x20 Maverick balloon deployed at [employer]. Distal stent-a 4x28 Liberte stent-deployed 12atm 48sec, 12atm 31sec, 4atm 19sec. Mid stent-a 4x28 Liberte stent-deployed 14atm 45sec, 14atm 60sec, 14atm 44sec. Proximal stent-4x8 Liberte- 14atm 45sec,14atm 47sec, 16atm 43sec. Severely diseased segment then appeared TIMI-3 flow.   CARDIOVASCULAR STRESS TEST  11/17/2012   No significant ECG changes. Septal perfusion defect is new when complared to study from 2010. Abnormal myocardial perfusion imaging with a basal to mid perfusion suggestive of previous MI.   CAROTID DOPPLER  08/09/2011   Bilateral Bulb/Proximal ICA - demonstrated a mild amount of fibrous plaque without evidence of significant diameter reduction reduction or other vascular abnormality.   CHEST TUBE INSERTION Right 09/02/2018   Procedure: Chest Tube Insertion;  Surgeon: Josephine Igo, DO;  Location: [facility] OR;  Service: Thoracic;  Laterality: Right;   COLONOSCOPY     2003, 2014   CORONARY ANGIOPLASTY WITH STENT PLACEMENT     FEMORAL ARTERY STENT     INGUINAL HERNIA REPAIR Right    IR IMAGING GUIDED  PORT INSERTION  11/13/2020   KNEE ARTHROSCOPY Right "multiple"   LAPAROSCOPIC APPENDECTOMY N/A 12/07/2018   Procedure: APPENDECTOMY LAPAROSCOPIC;  Surgeon: Abigail Miyamoto, MD;  Location: [facility];  Service: General;  Laterality: N/A;   LOWER EXTREMITY ARTERIAL DOPPLER  01/31/2011   Bilateral ABIs-normal values with no suggestion of arterial insuff to the lower extremities at rest. 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 Right common iliac artery with an 8.0x18 mm Herculink stent deployed at 12 atm. Stenosis was reduced from 80% to 0% with brisk  flow. b. I-cast stenting to left common iliac artery   PERIPHERAL VASCULAR CATHETERIZATION N/A 04/13/2015   Procedure: Lower Extremity Angiography;  Surgeon: Runell Gess, MD; L-oCIA 75%, 40-50% L-EIA, R-CIA stent patent, s/p 8 mm x 38 mm ICast covered stent>>0% stenosis in L-oCIA      SHOULDER ARTHROSCOPY WITH ROTATOR CUFF REPAIR Right    TRANSTHORACIC ECHOCARDIOGRAM  11/26/2012   EF not noted. Aortic valve-sclerosis without stenosis, no regurgiation.    UPPER GASTROINTESTINAL ENDOSCOPY     US CAROTID DOPPLER BILATERAL ([facility] HX)  08/09/2011   Bilateral Bulb/Proximal ICAa demonstrated a mild amount of fibrous plaque without evidence of significant diameter reduction or any other vascular abnormality.   VIDEO ASSISTED THORACOSCOPY (VATS)/ LOBECTOMY Right 09/14/2018   Procedure: RIGHT VIDEO ASSISTED THORACOSCOPY (VATS)/ RIGHT UPPER LOBECTOMY;  Surgeon: Loreli Slot, MD;  Location: [facility] OR;  Service: Thoracic;  Laterality: Right;   VIDEO BRONCHOSCOPY WITH ENDOBRONCHIAL NAVIGATION N/A 09/02/2018   Procedure: VIDEO BRONCHOSCOPY WITH ENDOBRONCHIAL NAVIGATION;  Surgeon: Josephine Igo, DO;  Location: [facility] OR;  Service: Thoracic;  Laterality: N/A;   VIDEO BRONCHOSCOPY WITH INSERTION OF INTERBRONCHIAL VALVE  (IBV) N/A 07/10/2022   Procedure: VIDEO BRONCHOSCOPY WITH INSERTION OF INTERBRONCHIAL VALVE (IBV);  Surgeon: Loreli Slot, MD;  Location: [facility] OR;  Service: Thoracic;  Laterality: N/A;   VIDEO BRONCHOSCOPY WITH INSERTION OF INTERBRONCHIAL VALVE (IBV) N/A 09/06/2022   Procedure: VIDEO BRONCHOSCOPY WITH REMOVAL OF INTRABRONCHIAL VALVE (IBV);  Surgeon: Loreli Slot, MD;  Location: [facility] OR;  Service: Thoracic;  Laterality: N/A;    Family History  Problem Relation Age of Onset   Colon cancer Mother    Heart disease Father    Heart disease Paternal Grandfather    Rectal cancer Other    Esophageal cancer Neg Hx    Stomach cancer Neg Hx     Allergies  Allergen Reactions   Compazine [Prochlorperazine] Other (See Comments)    " made him high and could not sleep". He does not want to take it again.    Current Outpatient Medications on File Prior to Visit  Medication Sig Dispense Refill   amLODipine (NORVASC) 5 MG tablet TAKE 1 TABLET BY MOUTH DAILY 180 tablet 0   aspirin EC 81 MG tablet Take 81 mg by mouth daily.     clopidogrel (PLAVIX) 75 MG tablet TAKE 1 TABLET BY MOUTH EVERY DAY 90 tablet 1   diclofenac Sodium (VOLTAREN) 1 % GEL Apply 2 g topically 4 (four) times daily. 150 g 3   doxylamine, Sleep, (UNISOM) 25 MG tablet Take 25 mg by mouth at bedtime as needed for sleep.     esomeprazole (NEXIUM) 20 MG capsule Take 40 mg by mouth daily.     fexofenadine (ALLEGRA) 180 MG tablet Take 180 mg by mouth daily.     folic acid (FOLVITE) 1 MG tablet Take 1 tablet (1 mg total) by mouth daily.     Magnesium 500 MG TABS Take 1 tablet (500 mg total) by mouth in the morning and at bedtime.     metoprolol succinate (TOPROL-XL) 25 MG 24 hr tablet TAKE 1 TABLET BY MOUTH DAILY 90 tablet 1   mirtazapine (REMERON) 15 MG tablet TAKE 1 TABLET(15 MG) BY MOUTH AT BEDTIME 90 tablet 0   Multiple Vitamin (MULTIVITAMIN WITH MINERALS) TABS tablet Take 1 tablet by mouth daily.     Oxycodone HCl 10 MG  TABS Take 1 tablet (10 mg total) by mouth daily as needed. 20 tablet 0  rosuvastatin (CRESTOR) 5 MG tablet TAKE 1 TABLET(5 MG) BY MOUTH EVERY OTHER DAY 45 tablet 1   TRELEGY ELLIPTA 100-62.5-25 MCG/ACT AEPB INHALE 1 PUFF INTO THE LUNGS DAILY 60 each 3   Current Facility-Administered Medications on File Prior to Visit  Medication Dose Route Frequency Provider Last Rate Last Admin   diphenhydrAMINE (BENADRYL) 25 mg capsule             There were no vitals taken for this visit.      Objective:   Physical Exam Vitals and nursing note reviewed.  Constitutional:      Appearance: Normal appearance.  Cardiovascular:     Rate and Rhythm: Normal rate and regular rhythm.     Pulses: Normal pulses.     Heart sounds: Normal heart sounds.  Pulmonary:     Effort: Pulmonary effort is normal.     Breath sounds: Normal breath sounds.  Musculoskeletal:        General: Normal range of motion.     Cervical back: Tenderness present. No rigidity or bony tenderness. Pain with movement present. Normal range of motion.       Back:     Comments: TTP with palpation throughout trapezius and paraspinal muscle groups   Skin:    General: Skin is warm and dry.  Neurological:     General: No focal deficit present.     Mental Status: He is alert and oriented to person, place, and time.  Psychiatric:        Mood and Affect: Mood normal.        Behavior: Behavior normal.        Thought Content: Thought content normal.        Judgment: Judgment normal.       Assessment & Plan:  1. Acute pain of right shoulder - Will trial him on medrol dose pack and Flexeril. He is aware that flexeril can cause drowsiness. Follow up if pain not resolved after taking medications  - methylPREDNISolone (MEDROL DOSEPAK) 4 MG TBPK tablet; Take as directed  Dispense: 21 tablet; Refill: 0 - cyclobenzaprine (FLEXERIL) 10 MG tablet; Take 1 tablet (10 mg total) by mouth 3 (three) times daily.  Dispense: 15 tablet; Refill: 0  [name], NP

## 2022-12-03 ENCOUNTER — Emergency Department (HOSPITAL_COMMUNITY): Payer: BC Managed Care – PPO

## 2022-12-03 ENCOUNTER — Inpatient Hospital Stay: Payer: BC Managed Care – PPO

## 2022-12-03 ENCOUNTER — Inpatient Hospital Stay (HOSPITAL_COMMUNITY)
Admission: EM | Admit: 2022-12-03 | Discharge: 2022-12-09 | DRG: 193 | Disposition: A | Discharge status: Home / Self Care | Payer: BC Managed Care – PPO | Attending: Internal Medicine | Admitting: Internal Medicine

## 2022-12-03 ENCOUNTER — Telehealth: Payer: Self-pay | Admitting: Internal Medicine

## 2022-12-03 DIAGNOSIS — Z888 Allergy status to other drugs, medicaments and biological substances status: Secondary | ICD-10-CM

## 2022-12-03 DIAGNOSIS — Z955 Presence of coronary angioplasty implant and graft: Secondary | ICD-10-CM

## 2022-12-03 DIAGNOSIS — N179 Acute kidney failure, unspecified: Secondary | ICD-10-CM | POA: Diagnosis present

## 2022-12-03 DIAGNOSIS — R197 Diarrhea, unspecified: Secondary | ICD-10-CM | POA: Diagnosis not present

## 2022-12-03 DIAGNOSIS — Z66 Do not resuscitate: Secondary | ICD-10-CM

## 2022-12-03 DIAGNOSIS — D500 Iron deficiency anemia secondary to blood loss (chronic): Secondary | ICD-10-CM | POA: Diagnosis not present

## 2022-12-03 DIAGNOSIS — J9811 Atelectasis: Secondary | ICD-10-CM | POA: Diagnosis not present

## 2022-12-03 DIAGNOSIS — Z7951 Long term (current) use of inhaled steroids: Secondary | ICD-10-CM

## 2022-12-03 DIAGNOSIS — D509 Iron deficiency anemia, unspecified: Secondary | ICD-10-CM | POA: Diagnosis present

## 2022-12-03 DIAGNOSIS — K219 Gastro-esophageal reflux disease without esophagitis: Secondary | ICD-10-CM | POA: Diagnosis present

## 2022-12-03 DIAGNOSIS — C3491 Malignant neoplasm of unspecified part of right bronchus or lung: Secondary | ICD-10-CM | POA: Diagnosis present

## 2022-12-03 DIAGNOSIS — Z902 Acquired absence of lung [part of]: Secondary | ICD-10-CM

## 2022-12-03 DIAGNOSIS — J9601 Acute respiratory failure with hypoxia: Secondary | ICD-10-CM | POA: Diagnosis present

## 2022-12-03 DIAGNOSIS — I129 Hypertensive chronic kidney disease with stage 1 through stage 4 chronic kidney disease, or unspecified chronic kidney disease: Secondary | ICD-10-CM | POA: Diagnosis present

## 2022-12-03 DIAGNOSIS — G8929 Other chronic pain: Secondary | ICD-10-CM | POA: Diagnosis present

## 2022-12-03 DIAGNOSIS — J439 Emphysema, unspecified: Secondary | ICD-10-CM | POA: Diagnosis present

## 2022-12-03 DIAGNOSIS — E43 Unspecified severe protein-calorie malnutrition: Secondary | ICD-10-CM | POA: Diagnosis not present

## 2022-12-03 DIAGNOSIS — J440 Chronic obstructive pulmonary disease with acute lower respiratory infection: Secondary | ICD-10-CM | POA: Diagnosis present

## 2022-12-03 DIAGNOSIS — E222 Syndrome of inappropriate secretion of antidiuretic hormone: Secondary | ICD-10-CM | POA: Diagnosis present

## 2022-12-03 DIAGNOSIS — E785 Hyperlipidemia, unspecified: Secondary | ICD-10-CM | POA: Diagnosis present

## 2022-12-03 DIAGNOSIS — J189 Pneumonia, unspecified organism: Secondary | ICD-10-CM | POA: Diagnosis not present

## 2022-12-03 DIAGNOSIS — N1832 Chronic kidney disease, stage 3b: Secondary | ICD-10-CM | POA: Diagnosis present

## 2022-12-03 DIAGNOSIS — E861 Hypovolemia: Secondary | ICD-10-CM | POA: Diagnosis present

## 2022-12-03 DIAGNOSIS — Z7902 Long term (current) use of antithrombotics/antiplatelets: Secondary | ICD-10-CM

## 2022-12-03 DIAGNOSIS — K2971 Gastritis, unspecified, with bleeding: Secondary | ICD-10-CM

## 2022-12-03 DIAGNOSIS — J181 Lobar pneumonia, unspecified organism: Secondary | ICD-10-CM | POA: Diagnosis not present

## 2022-12-03 DIAGNOSIS — J449 Chronic obstructive pulmonary disease, unspecified: Secondary | ICD-10-CM | POA: Diagnosis not present

## 2022-12-03 DIAGNOSIS — D72829 Elevated white blood cell count, unspecified: Secondary | ICD-10-CM

## 2022-12-03 DIAGNOSIS — T451X5A Adverse effect of antineoplastic and immunosuppressive drugs, initial encounter: Secondary | ICD-10-CM | POA: Diagnosis present

## 2022-12-03 DIAGNOSIS — N183 Chronic kidney disease, stage 3 unspecified: Secondary | ICD-10-CM | POA: Diagnosis present

## 2022-12-03 DIAGNOSIS — K921 Melena: Secondary | ICD-10-CM | POA: Diagnosis not present

## 2022-12-03 DIAGNOSIS — C7951 Secondary malignant neoplasm of bone: Secondary | ICD-10-CM | POA: Diagnosis present

## 2022-12-03 DIAGNOSIS — I739 Peripheral vascular disease, unspecified: Secondary | ICD-10-CM | POA: Diagnosis present

## 2022-12-03 DIAGNOSIS — Z9221 Personal history of antineoplastic chemotherapy: Secondary | ICD-10-CM

## 2022-12-03 DIAGNOSIS — R0602 Shortness of breath: Secondary | ICD-10-CM | POA: Diagnosis not present

## 2022-12-03 DIAGNOSIS — R0902 Hypoxemia: Secondary | ICD-10-CM | POA: Diagnosis not present

## 2022-12-03 DIAGNOSIS — Z515 Encounter for palliative care: Secondary | ICD-10-CM | POA: Diagnosis not present

## 2022-12-03 DIAGNOSIS — C799 Secondary malignant neoplasm of unspecified site: Secondary | ICD-10-CM | POA: Diagnosis present

## 2022-12-03 DIAGNOSIS — J90 Pleural effusion, not elsewhere classified: Secondary | ICD-10-CM | POA: Diagnosis present

## 2022-12-03 DIAGNOSIS — Z681 Body mass index (BMI) 19 or less, adult: Secondary | ICD-10-CM

## 2022-12-03 DIAGNOSIS — Z7982 Long term (current) use of aspirin: Secondary | ICD-10-CM

## 2022-12-03 DIAGNOSIS — E871 Hypo-osmolality and hyponatremia: Secondary | ICD-10-CM | POA: Diagnosis not present

## 2022-12-03 DIAGNOSIS — C801 Malignant (primary) neoplasm, unspecified: Secondary | ICD-10-CM | POA: Diagnosis not present

## 2022-12-03 DIAGNOSIS — Z79899 Other long term (current) drug therapy: Secondary | ICD-10-CM | POA: Diagnosis not present

## 2022-12-03 DIAGNOSIS — K920 Hematemesis: Secondary | ICD-10-CM | POA: Diagnosis present

## 2022-12-03 DIAGNOSIS — R000 Tachycardia, unspecified: Secondary | ICD-10-CM | POA: Diagnosis present

## 2022-12-03 DIAGNOSIS — I252 Old myocardial infarction: Secondary | ICD-10-CM

## 2022-12-03 DIAGNOSIS — Z87891 Personal history of nicotine dependence: Secondary | ICD-10-CM

## 2022-12-03 DIAGNOSIS — R350 Frequency of micturition: Secondary | ICD-10-CM | POA: Diagnosis not present

## 2022-12-03 DIAGNOSIS — C7800 Secondary malignant neoplasm of unspecified lung: Secondary | ICD-10-CM | POA: Diagnosis not present

## 2022-12-03 DIAGNOSIS — E860 Dehydration: Secondary | ICD-10-CM | POA: Diagnosis present

## 2022-12-03 DIAGNOSIS — M438X4 Other specified deforming dorsopathies, thoracic region: Secondary | ICD-10-CM | POA: Diagnosis present

## 2022-12-03 DIAGNOSIS — Z800 Family history of malignant neoplasm of digestive organs: Secondary | ICD-10-CM

## 2022-12-03 DIAGNOSIS — I2510 Atherosclerotic heart disease of native coronary artery without angina pectoris: Secondary | ICD-10-CM

## 2022-12-03 DIAGNOSIS — Z7189 Other specified counseling: Secondary | ICD-10-CM

## 2022-12-03 DIAGNOSIS — T39015A Adverse effect of aspirin, initial encounter: Secondary | ICD-10-CM | POA: Diagnosis present

## 2022-12-03 DIAGNOSIS — E876 Hypokalemia: Secondary | ICD-10-CM | POA: Diagnosis present

## 2022-12-03 DIAGNOSIS — Z923 Personal history of irradiation: Secondary | ICD-10-CM

## 2022-12-03 DIAGNOSIS — Z8249 Family history of ischemic heart disease and other diseases of the circulatory system: Secondary | ICD-10-CM

## 2022-12-03 DIAGNOSIS — Z95820 Peripheral vascular angioplasty status with implants and grafts: Secondary | ICD-10-CM

## 2022-12-03 DIAGNOSIS — R112 Nausea with vomiting, unspecified: Secondary | ICD-10-CM | POA: Diagnosis not present

## 2022-12-03 DIAGNOSIS — K3189 Other diseases of stomach and duodenum: Secondary | ICD-10-CM | POA: Diagnosis not present

## 2022-12-03 DIAGNOSIS — M549 Dorsalgia, unspecified: Secondary | ICD-10-CM | POA: Diagnosis present

## 2022-12-03 DIAGNOSIS — R64 Cachexia: Secondary | ICD-10-CM | POA: Diagnosis not present

## 2022-12-03 DIAGNOSIS — J9691 Respiratory failure, unspecified with hypoxia: Secondary | ICD-10-CM | POA: Diagnosis not present

## 2022-12-03 DIAGNOSIS — T45525A Adverse effect of antithrombotic drugs, initial encounter: Secondary | ICD-10-CM | POA: Diagnosis present

## 2022-12-03 DIAGNOSIS — R54 Age-related physical debility: Secondary | ICD-10-CM | POA: Diagnosis present

## 2022-12-03 LAB — MRSA NEXT GEN BY PCR, NASAL: MRSA by PCR Next Gen: NOT DETECTED

## 2022-12-03 LAB — CBC WITH DIFFERENTIAL/PLATELET
Abs Immature Granulocytes: 0.05 10*3/uL (ref 0.00–0.07)
Basophils Absolute: 0 10*3/uL (ref 0.0–0.1)
Basophils Relative: 0 %
Eosinophils Absolute: 0 10*3/uL (ref 0.0–0.5)
Eosinophils Relative: 0 %
HCT: 34.5 % — ABNORMAL LOW (ref 39.0–52.0)
Hemoglobin: 11.6 g/dL — ABNORMAL LOW (ref 13.0–17.0)
Immature Granulocytes: 0 %
Lymphocytes Relative: 7 %
Lymphs Abs: 1.2 10*3/uL (ref 0.7–4.0)
MCH: 30.9 pg (ref 26.0–34.0)
MCHC: 33.6 g/dL (ref 30.0–36.0)
MCV: 91.8 fL (ref 80.0–100.0)
Monocytes Absolute: 0.7 10*3/uL (ref 0.1–1.0)
Monocytes Relative: 4 %
Neutro Abs: 14.3 10*3/uL — ABNORMAL HIGH (ref 1.7–7.7)
Neutrophils Relative %: 89 %
Platelets: 529 10*3/uL — ABNORMAL HIGH (ref 150–400)
RBC: 3.76 MIL/uL — ABNORMAL LOW (ref 4.22–5.81)
RDW: 16.4 % — ABNORMAL HIGH (ref 11.5–15.5)
WBC: 16.2 10*3/uL — ABNORMAL HIGH (ref 4.0–10.5)
nRBC: 0 % (ref 0.0–0.2)

## 2022-12-03 LAB — HEMOGLOBIN AND HEMATOCRIT, BLOOD
HCT: 31.3 % — ABNORMAL LOW (ref 39.0–52.0)
Hemoglobin: 10.4 g/dL — ABNORMAL LOW (ref 13.0–17.0)

## 2022-12-03 LAB — COMPREHENSIVE METABOLIC PANEL
ALT: 15 U/L (ref 0–44)
AST: 17 U/L (ref 15–41)
Albumin: 3.5 g/dL (ref 3.5–5.0)
Alkaline Phosphatase: 66 U/L (ref 38–126)
Anion gap: 13 (ref 5–15)
BUN: 50 mg/dL — ABNORMAL HIGH (ref 8–23)
CO2: 30 mmol/L (ref 22–32)
Calcium: 9.6 mg/dL (ref 8.9–10.3)
Chloride: 85 mmol/L — ABNORMAL LOW (ref 98–111)
Creatinine, Ser: 1.75 mg/dL — ABNORMAL HIGH (ref 0.61–1.24)
GFR, Estimated: 42 mL/min — ABNORMAL LOW (ref >60)
Glucose, Bld: 132 mg/dL — ABNORMAL HIGH (ref 70–99)
Potassium: 3.1 mmol/L — ABNORMAL LOW (ref 3.5–5.1)
Sodium: 128 mmol/L — ABNORMAL LOW (ref 135–145)
Total Bilirubin: 0.7 mg/dL (ref 0.3–1.2)
Total Protein: 7.5 g/dL (ref 6.5–8.1)

## 2022-12-03 LAB — TROPONIN I (HIGH SENSITIVITY)
Troponin I (High Sensitivity): 16 ng/L (ref <18)
Troponin I (High Sensitivity): 16 ng/L (ref <18)

## 2022-12-03 LAB — POC OCCULT BLOOD, ED: Fecal Occult Bld: NEGATIVE

## 2022-12-03 LAB — LIPASE, BLOOD: Lipase: 22 U/L (ref 11–51)

## 2022-12-03 LAB — MAGNESIUM: Magnesium: 2.5 mg/dL — ABNORMAL HIGH (ref 1.7–2.4)

## 2022-12-03 MED ORDER — LORAZEPAM 2 MG/ML IJ SOLN: 0.5000 mg | Freq: Four times a day (QID) | INTRAMUSCULAR | Status: DC | PRN

## 2022-12-03 MED ORDER — DOXYLAMINE SUCCINATE (SLEEP) 25 MG PO TABS
25.0000 mg | ORAL_TABLET | Freq: Every evening | ORAL | Status: DC | PRN
  Administered 2022-12-04 – 2022-12-06 (×2): 25 mg via ORAL
  Filled 2022-12-03 (×4): qty 1

## 2022-12-03 MED ORDER — ACETAMINOPHEN 650 MG RE SUPP: 650.0000 mg | Freq: Four times a day (QID) | RECTAL | Status: DC | PRN

## 2022-12-03 MED ORDER — ACETAMINOPHEN 325 MG PO TABS
650.0000 mg | ORAL_TABLET | Freq: Four times a day (QID) | ORAL | Status: DC | PRN
  Administered 2022-12-05: 650 mg via ORAL
  Filled 2022-12-03: qty 2

## 2022-12-03 MED ORDER — FAMOTIDINE IN NACL 20-0.9 MG/50ML-% IV SOLN
20.0000 mg | Freq: Once | INTRAVENOUS | Status: AC
  Administered 2022-12-03: 20 mg via INTRAVENOUS
  Filled 2022-12-03: qty 50

## 2022-12-03 MED ORDER — SODIUM CHLORIDE 0.9 % IV SOLN
2.0000 g | INTRAVENOUS | Status: DC
  Administered 2022-12-04 – 2022-12-05 (×2): 2 g via INTRAVENOUS
  Filled 2022-12-03 (×2): qty 20

## 2022-12-03 MED ORDER — SODIUM CHLORIDE 0.9 % IV SOLN
250.0000 mL | INTRAVENOUS | Status: DC | PRN
  Administered 2022-12-05: 250 mL via INTRAVENOUS

## 2022-12-03 MED ORDER — SODIUM CHLORIDE (PF) 0.9 % IJ SOLN
INTRAMUSCULAR | Status: AC
  Filled 2022-12-03: qty 50

## 2022-12-03 MED ORDER — ONDANSETRON HCL 4 MG/2ML IJ SOLN
4.0000 mg | Freq: Four times a day (QID) | INTRAMUSCULAR | Status: DC | PRN
  Administered 2022-12-03: 4 mg via INTRAVENOUS
  Filled 2022-12-03: qty 2

## 2022-12-03 MED ORDER — FLUTICASONE FUROATE-VILANTEROL 100-25 MCG/ACT IN AEPB
1.0000 | INHALATION_SPRAY | Freq: Every day | RESPIRATORY_TRACT | Status: DC
  Administered 2022-12-04 – 2022-12-09 (×6): 1 via RESPIRATORY_TRACT
  Filled 2022-12-03: qty 28

## 2022-12-03 MED ORDER — PANTOPRAZOLE INFUSION (NEW) - SIMPLE MED
8.0000 mg/h | INTRAVENOUS | Status: DC
  Administered 2022-12-03 – 2022-12-04 (×3): 8 mg/h via INTRAVENOUS
  Filled 2022-12-03: qty 80
  Filled 2022-12-03: qty 100
  Filled 2022-12-03 (×2): qty 80
  Filled 2022-12-03: qty 100

## 2022-12-03 MED ORDER — IOHEXOL 350 MG/ML SOLN
80.0000 mL | Freq: Once | INTRAVENOUS | Status: AC | PRN
  Administered 2022-12-03: 80 mL via INTRAVENOUS

## 2022-12-03 MED ORDER — SODIUM CHLORIDE 0.9% FLUSH
3.0000 mL | Freq: Two times a day (BID) | INTRAVENOUS | Status: DC
  Administered 2022-12-04 – 2022-12-08 (×9): 3 mL via INTRAVENOUS

## 2022-12-03 MED ORDER — ONDANSETRON HCL 4 MG PO TABS: 4.0000 mg | ORAL_TABLET | Freq: Four times a day (QID) | ORAL | Status: DC | PRN

## 2022-12-03 MED ORDER — ONDANSETRON HCL 4 MG/2ML IJ SOLN
4.0000 mg | Freq: Once | INTRAMUSCULAR | Status: AC
  Administered 2022-12-03: 4 mg via INTRAVENOUS
  Filled 2022-12-03: qty 2

## 2022-12-03 MED ORDER — SODIUM CHLORIDE 0.9 % IV SOLN
500.0000 mg | Freq: Once | INTRAVENOUS | Status: AC
  Administered 2022-12-03: 500 mg via INTRAVENOUS
  Filled 2022-12-03: qty 5

## 2022-12-03 MED ORDER — METOPROLOL TARTRATE 5 MG/5ML IV SOLN: 5.0000 mg | Freq: Four times a day (QID) | INTRAVENOUS | Status: DC | PRN

## 2022-12-03 MED ORDER — UMECLIDINIUM BROMIDE 62.5 MCG/ACT IN AEPB
1.0000 | INHALATION_SPRAY | Freq: Every day | RESPIRATORY_TRACT | Status: DC
  Administered 2022-12-04 – 2022-12-09 (×6): 1 via RESPIRATORY_TRACT
  Filled 2022-12-03: qty 7

## 2022-12-03 MED ORDER — GUAIFENESIN-DM 100-10 MG/5ML PO SYRP
5.0000 mL | ORAL_SOLUTION | ORAL | Status: DC | PRN
  Filled 2022-12-03: qty 5

## 2022-12-03 MED ORDER — SODIUM CHLORIDE 0.9 % IV SOLN
1.0000 g | Freq: Once | INTRAVENOUS | Status: AC
  Administered 2022-12-03: 1 g via INTRAVENOUS
  Filled 2022-12-03: qty 10

## 2022-12-03 MED ORDER — SODIUM CHLORIDE 0.9 % IV SOLN: 1.0000 g | Freq: Every day | INTRAVENOUS | Status: DC

## 2022-12-03 MED ORDER — PANTOPRAZOLE SODIUM 40 MG IV SOLR: 40.0000 mg | Freq: Two times a day (BID) | INTRAVENOUS | Status: DC

## 2022-12-03 MED ORDER — SODIUM CHLORIDE 0.9% FLUSH: 3.0000 mL | INTRAVENOUS | Status: DC | PRN

## 2022-12-03 MED ORDER — PANTOPRAZOLE 80MG IVPB - SIMPLE MED
80.0000 mg | Freq: Once | INTRAVENOUS | Status: AC
  Administered 2022-12-03: 80 mg via INTRAVENOUS
  Filled 2022-12-03: qty 80

## 2022-12-03 MED ORDER — METHOCARBAMOL 1000 MG/10ML IJ SOLN
500.0000 mg | Freq: Four times a day (QID) | INTRAVENOUS | Status: DC | PRN
  Administered 2022-12-05: 500 mg via INTRAVENOUS
  Filled 2022-12-03: qty 500

## 2022-12-03 MED ORDER — POTASSIUM CHLORIDE 10 MEQ/100ML IV SOLN
10.0000 meq | INTRAVENOUS | Status: AC
  Administered 2022-12-03 (×4): 10 meq via INTRAVENOUS
  Filled 2022-12-03 (×4): qty 100

## 2022-12-03 MED ORDER — UMECLIDINIUM BROMIDE 62.5 MCG/ACT IN AEPB: 1.0000 | INHALATION_SPRAY | Freq: Every day | RESPIRATORY_TRACT | Status: DC

## 2022-12-03 MED ORDER — SODIUM CHLORIDE 0.9 % IV SOLN
500.0000 mg | INTRAVENOUS | Status: DC
  Administered 2022-12-04: 500 mg via INTRAVENOUS
  Filled 2022-12-03: qty 5

## 2022-12-03 MED ORDER — DEXTROSE-NACL 5-0.9 % IV SOLN: INTRAVENOUS | Status: DC

## 2022-12-03 MED ORDER — SODIUM CHLORIDE 0.9 % IV SOLN: INTRAVENOUS | Status: DC

## 2022-12-03 MED ORDER — FLUTICASONE FUROATE-VILANTEROL 100-25 MCG/ACT IN AEPB: 1.0000 | INHALATION_SPRAY | Freq: Every day | RESPIRATORY_TRACT | Status: DC

## 2022-12-03 MED ORDER — SODIUM CHLORIDE 0.9 % IV BOLUS
1000.0000 mL | Freq: Once | INTRAVENOUS | Status: AC
  Administered 2022-12-03: 1000 mL via INTRAVENOUS

## 2022-12-03 MED ORDER — HYDROMORPHONE HCL 1 MG/ML IJ SOLN
0.5000 mg | INTRAMUSCULAR | Status: DC | PRN
  Administered 2022-12-05 (×2): 0.5 mg via INTRAVENOUS
  Filled 2022-12-03 (×2): qty 1

## 2022-12-03 NOTE — Consult Note (Addendum)
Referring Provider: Jeanelle Malling PA-C Primary Care Physician:  [name], NP Primary Gastroenterologist:  Dr. Stan Head  Reason for Consultation: Dark stools, diarrhea   HPI: Sean Rose is a 67 y.o. male with a past medical history of hypertension, hyperlipidemia, coronary artery disease s/p coronary stent placement 2007 and 2008, PAD s/p right  iliac stent 2007on Plavix, stage IV, metastatic non-small cell lung cancer carcinoma treated with chemo and Keytruda, COPD stage IV, severe emphysema, right pneumothorax s/p pleurodesis 06/2022 and s/p bronchoscopy for intrabronchial valve placement 09/06/2022, CKD, GERD and iron deficiency anemia.   He presented to the ED today with N/V and diarrhea. He noted his stools were dark. No abdominal pain.  He also endorsed vomiting up black liquid and coughing up black phlegm. FOBT negative.  Labs in the ED showed a WBC count of 16.2 (on oral steroids).  Hemoglobin 11.6 which is up from hemoglobin level 9.7 on 09/30/2022.  Hematocrit 34.5.  Platelet 529.  BUN 50 up from 25.  Creatinine 1.75 up from 1.68.  Sodium 128.  Potassium 3.1.  Normal LFTs.  Lipase 22.  Magnesium 2.5.  CTA was negative for PE but identified groundglass airspace opacity throughout the lung bases consistent with infection versus aspiration with severe underlying emphysema and new inferior endplate wedge deformity of T7.  CTAP with contrast completed, results pending.  He endorsed having diarrhea for the past 10 days. He passed brown watery stools 2 to 4 times daily x 7 days, then diarrhea was darker brown and yesterday he passed black watery stool. He also had N/V yesterday. He vomited up black watery emesis at least 10 episodes yesterday. His wife stated his vomiting was nonstop. Since arriving to the ED, he vomited up coffee colored watery emesis x 3 to 4 episodes. He is also coughing up gray colored phlegm.  He is on Plavix secondary to CAD with past stent placement and PAD with past iliac  stent placement.  His last dose of Plavix was taken at 9 AM on Sunday 12/01/2022.  He also takes ASA 81 mg daily.  No other NSAID use.  He was prescribed Medrol Dosepak for right shoulder pain 11/27/2022 by his PCP.  No recent antibiotics. He has iron deficiency anemia for which he receives IV iron per oncology.  He last received IV iron 10/22/2022.  He has a history of GERD.  He takes Nexium OTC daily.  He denies having any dysphagia or heartburn but has increased burping. He underwent an EGD 04/2021 which showed evidence of Candida cyanosis esophagitis treated with fluconazole and a 3 cm hiatal hernia.  He went a colonoscopy 01/2013 which showed hemorrhoids otherwise was normal.  GI PROCEDURES:  EGD 05/22/2021: - Esophageal plaques were found, consistent with candidiasis. Tx w/ fluconazole x 2 weeks - 3 cm hiatal hernia.  - Prominent rugae mucosa in the gastric fundus and gastric body. Biopsied.  - The examination was otherwise normal.  - Biopsies were taken with a cold forceps for histology in the gastric antrum.  - Biopsies were taken with a cold forceps for histology in the entire duodenum. ? if he had a transient gastritis/gastroenteritis vs Check-point inhibitor issues  Colonoscopy 01/28/2013: Internal hemorrhoids External hemorrhoids Normal colonoscopy Excellent prep Repeat colonoscopy in 10 years  Past Medical History:  Diagnosis Date   Allergy    Anemia    Anginal pain (HCC)    Blood transfusion without reported diagnosis    CAD (coronary artery disease)  BMS to RCA 2007. Low risk nuclear stress 2020.   CAP (community acquired pneumonia) 09/2016   COPD (chronic obstructive pulmonary disease) (HCC)    Dyspnea    occasionally   Emphysema of lung (HCC)    GERD (gastroesophageal reflux disease)    Heart murmur    "I was told I had one when I was a kid"   Hemorrhoids    History of anal fissures    "no surgeries" (10/30/2016)   Hyperlipidemia    Hypertension    lung ca dx'd 08/2018    with mets to bones in arms   Myocardial infarction [facility])    Peripheral arterial disease (HCC)    status post right common iliac artery stenting back in 2007   Seasonal allergies    Tobacco abuse     Past Surgical History:  Procedure Laterality Date   ANKLE SURGERY Left    "rebuilt it"   ANTERIOR CRUCIATE LIGAMENT REPAIR Right    CARDIAC CATHETERIZATION  09/23/2008   Continued medical therapy - may need GI evaluation in addition.   CARDIAC CATHETERIZATION  10/28/2007   Medical therapy recommended.   CARDIAC CATHETERIZATION  11/18/2006   In-stent restenosis RCA  (50% distal edge, 80% segmental mid, and 50-60% segmental proximal). Successful cutting balloon atherectomy using a [id number] cutting balloon. 3 inflations with atherectomy performed on mid and proximal portions resulting in reduction of 80% mid in-stent restenosis to less than 20% residual and 50-60% segmental proximal to less than 20% residual without dissection.   CARDIAC CATHETERIZATION  02/26/2006   Severe stenosis in RCA. Stenting performed using IVUS. 3.5x20 Maverick balloon deployed at [employer]. Distal stent-a 4x28 Liberte stent-deployed 12atm 48sec, 12atm 31sec, 4atm 19sec. Mid stent-a 4x28 Liberte stent-deployed 14atm 45sec, 14atm 60sec, 14atm 44sec. Proximal stent-4x8 Liberte- 14atm 45sec,14atm 47sec, 16atm 43sec. Severely diseased segment then appeared TIMI-3 flow.   CARDIOVASCULAR STRESS TEST  11/17/2012   No significant ECG changes. Septal perfusion defect is new when complared to study from 2010. Abnormal myocardial perfusion imaging with a basal to mid perfusion suggestive of previous MI.   CAROTID DOPPLER  08/09/2011   Bilateral Bulb/Proximal ICA - demonstrated a mild amount of fibrous plaque without evidence of significant diameter reduction reduction or other vascular abnormality.   CHEST TUBE INSERTION Right 09/02/2018   Procedure: Chest Tube Insertion;  Surgeon: Josephine Igo, DO;  Location: [facility] OR;  Service: Thoracic;   Laterality: Right;   COLONOSCOPY     2003, 2014   CORONARY ANGIOPLASTY WITH STENT PLACEMENT     FEMORAL ARTERY STENT     INGUINAL HERNIA REPAIR Right    IR IMAGING GUIDED PORT INSERTION  11/13/2020   KNEE ARTHROSCOPY Right "multiple"   LAPAROSCOPIC APPENDECTOMY N/A 12/07/2018   Procedure: APPENDECTOMY LAPAROSCOPIC;  Surgeon: Abigail Miyamoto, MD;  Location: [facility];  Service: General;  Laterality: N/A;   LOWER EXTREMITY ARTERIAL DOPPLER  01/31/2011   Bilateral ABIs-normal values with no suggestion of arterial insuff to the lower extremities at rest. 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 Right common iliac artery with an 8.0x18 mm Herculink stent deployed at 12 atm. Stenosis was reduced from 80% to 0% with brisk flow. b. I-cast stenting to left common iliac artery   PERIPHERAL VASCULAR CATHETERIZATION N/A 04/13/2015   Procedure: Lower Extremity Angiography;  Surgeon: Runell Gess, MD;  L-oCIA 75%, 40-50% L-EIA, R-CIA stent patent, s/p 8 mm x 38 mm ICast covered stent>>0% stenosis in L-oCIA      SHOULDER ARTHROSCOPY WITH ROTATOR CUFF REPAIR Right    TRANSTHORACIC ECHOCARDIOGRAM  11/26/2012   EF not noted. Aortic valve-sclerosis without stenosis, no regurgiation.    UPPER GASTROINTESTINAL ENDOSCOPY     US CAROTID DOPPLER BILATERAL ([facility] HX)  08/09/2011   Bilateral Bulb/Proximal ICAa demonstrated a mild amount of fibrous plaque without evidence of significant diameter reduction or any other vascular abnormality.   VIDEO ASSISTED THORACOSCOPY (VATS)/ LOBECTOMY Right 09/14/2018   Procedure: RIGHT VIDEO ASSISTED THORACOSCOPY (VATS)/ RIGHT UPPER LOBECTOMY;  Surgeon: Loreli Slot, MD;  Location: [facility] OR;  Service: Thoracic;  Laterality: Right;   VIDEO BRONCHOSCOPY WITH ENDOBRONCHIAL NAVIGATION N/A 09/02/2018   Procedure: VIDEO BRONCHOSCOPY  WITH ENDOBRONCHIAL NAVIGATION;  Surgeon: Josephine Igo, DO;  Location: [facility] OR;  Service: Thoracic;  Laterality: N/A;   VIDEO BRONCHOSCOPY WITH INSERTION OF INTERBRONCHIAL VALVE (IBV) N/A 07/10/2022   Procedure: VIDEO BRONCHOSCOPY WITH INSERTION OF INTERBRONCHIAL VALVE (IBV);  Surgeon: Loreli Slot, MD;  Location: [facility] OR;  Service: Thoracic;  Laterality: N/A;   VIDEO BRONCHOSCOPY WITH INSERTION OF INTERBRONCHIAL VALVE (IBV) N/A 09/06/2022   Procedure: VIDEO BRONCHOSCOPY WITH REMOVAL OF INTRABRONCHIAL VALVE (IBV);  Surgeon: Loreli Slot, MD;  Location: [facility] OR;  Service: Thoracic;  Laterality: N/A;    Prior to Admission medications   Medication Sig Start Date End Date Taking? Authorizing Provider  amLODipine (NORVASC) 5 MG tablet TAKE 1 TABLET BY MOUTH DAILY 06/24/22   Runell Gess, MD  aspirin EC 81 MG tablet Take 81 mg by mouth daily.    [provider]  clopidogrel (PLAVIX) 75 MG tablet TAKE 1 TABLET BY MOUTH EVERY DAY 09/23/22   Runell Gess, MD  cyclobenzaprine (FLEXERIL) 10 MG tablet Take 1 tablet (10 mg total) by mouth 3 (three) times daily. 11/27/22   Nafziger, Kandee Keen, NP  diclofenac Sodium (VOLTAREN) 1 % GEL Apply 2 g topically 4 (four) times daily. 11/20/22   Nafziger, Kandee Keen, NP  doxylamine, Sleep, (UNISOM) 25 MG tablet Take 25 mg by mouth at bedtime as needed for sleep.    [provider]  esomeprazole (NEXIUM) 20 MG capsule Take 40 mg by mouth daily.    [provider]  fexofenadine (ALLEGRA) 180 MG tablet Take 180 mg by mouth daily.    [provider]  folic acid (FOLVITE) 1 MG tablet Take 1 tablet (1 mg total) by mouth daily. 06/26/22   Gold, Glenice Laine, PA-C  Magnesium 500 MG TABS Take 1 tablet (500 mg total) by mouth in the morning and at bedtime. 06/26/22   Rowe Clack, PA-C  methylPREDNISolone (MEDROL DOSEPAK) 4 MG TBPK tablet Take as directed 11/27/22   [name], NP  metoprolol succinate (TOPROL-XL) 25 MG 24 hr tablet TAKE  1 TABLET BY MOUTH DAILY 08/23/22   Runell Gess, MD  mirtazapine (REMERON) 15 MG tablet TAKE 1 TABLET(15 MG) BY MOUTH AT BEDTIME 06/26/22   Nafziger, Kandee Keen, NP  Multiple Vitamin (MULTIVITAMIN WITH MINERALS) TABS tablet Take 1 tablet by mouth daily.    [provider]  rosuvastatin (CRESTOR) 5 MG tablet TAKE 1 TABLET(5 MG) BY MOUTH EVERY OTHER DAY 09/23/22   Runell Gess, MD  TRELEGY ELLIPTA 100-62.5-25 MCG/ACT AEPB INHALE 1 PUFF INTO THE LUNGS DAILY 10/22/22   [name], NP    Current Facility-Administered Medications  Medication Dose Route Frequency Provider  Last Rate Last Admin   [START ON 12/06/2022] pantoprazole (PROTONIX) injection 40 mg  40 mg Intravenous Q12H Terald Sleeper, MD       pantoprozole (PROTONIX) 80 mg /NS 100 mL infusion  8 mg/hr Intravenous Continuous Terald Sleeper, MD 10 mL/hr at 12/03/22 1307 8 mg/hr at 12/03/22 1307   Current Outpatient Medications  Medication Sig Dispense Refill   amLODipine (NORVASC) 5 MG tablet TAKE 1 TABLET BY MOUTH DAILY 180 tablet 0   aspirin EC 81 MG tablet Take 81 mg by mouth daily.     clopidogrel (PLAVIX) 75 MG tablet TAKE 1 TABLET BY MOUTH EVERY DAY 90 tablet 1   cyclobenzaprine (FLEXERIL) 10 MG tablet Take 1 tablet (10 mg total) by mouth 3 (three) times daily. 15 tablet 0   diclofenac Sodium (VOLTAREN) 1 % GEL Apply 2 g topically 4 (four) times daily. 150 g 3   doxylamine, Sleep, (UNISOM) 25 MG tablet Take 25 mg by mouth at bedtime as needed for sleep.     esomeprazole (NEXIUM) 20 MG capsule Take 40 mg by mouth daily.     fexofenadine (ALLEGRA) 180 MG tablet Take 180 mg by mouth daily.     folic acid (FOLVITE) 1 MG tablet Take 1 tablet (1 mg total) by mouth daily.     Magnesium 500 MG TABS Take 1 tablet (500 mg total) by mouth in the morning and at bedtime.     methylPREDNISolone (MEDROL DOSEPAK) 4 MG TBPK tablet Take as directed 21 tablet 0   metoprolol succinate (TOPROL-XL) 25 MG 24 hr tablet TAKE 1 TABLET BY  MOUTH DAILY 90 tablet 1   mirtazapine (REMERON) 15 MG tablet TAKE 1 TABLET(15 MG) BY MOUTH AT BEDTIME 90 tablet 0   Multiple Vitamin (MULTIVITAMIN WITH MINERALS) TABS tablet Take 1 tablet by mouth daily.     rosuvastatin (CRESTOR) 5 MG tablet TAKE 1 TABLET(5 MG) BY MOUTH EVERY OTHER DAY 45 tablet 1   TRELEGY ELLIPTA 100-62.5-25 MCG/ACT AEPB INHALE 1 PUFF INTO THE LUNGS DAILY 60 each 3   Facility-Administered Medications Ordered in Other Encounters  Medication Dose Route Frequency Provider Last Rate Last Admin   diphenhydrAMINE (BENADRYL) 25 mg capsule             Allergies as of 12/03/2022 - Review Complete 12/03/2022  Allergen Reaction Noted   Compazine [prochlorperazine] Other (See Comments) 11/14/2020    Family History  Problem Relation Age of Onset   Colon cancer Mother    Heart disease Father    Heart disease Paternal Grandfather    Rectal cancer Other    Esophageal cancer Neg Hx    Stomach cancer Neg Hx     Social History   Socioeconomic History   Marital status: Married    Spouse name: Not on file   Number of children: 2   Years of education: Not on file   Highest education level: Not on file  Occupational History   Occupation: retired  Tobacco Use   Smoking status: Former    Packs/day: 0.25    Years: 38.00    Additional pack years: 0.00    Total pack years: 9.50    Types: Cigarettes    Quit date: 10/19/2018    Years since quitting: 4.1   Smokeless tobacco: Never   Tobacco comments:    pack per day 12.17.19  Vaping Use   Vaping Use: Former  Substance and Sexual Activity   Alcohol use: Not Currently    Comment:  socially   Drug use: No   Sexual activity: Yes  Other Topics Concern   Not on file  Social History Narrative   Patient is married with 2 children   Former smoker no tobacco now no drug use occasional/social alcohol   Social Determinants of Health   Financial Resource Strain: Not on file  Food Insecurity: No Food Insecurity (07/04/2022)    Hunger Vital Sign    Worried About Running Out of Food in the Last Year: Never true    Ran Out of Food in the Last Year: Never true  Transportation Needs: No Transportation Needs (07/04/2022)   PRAPARE - Administrator, Civil Service (Medical): No    Lack of Transportation (Non-Medical): No  Physical Activity: Not on file  Stress: Not on file  Social Connections: Not on file  Intimate Partner Violence: Not At Risk (07/04/2022)   Humiliation, Afraid, Rape, and Kick questionnaire    Fear of Current or Ex-Partner: No    Emotionally Abused: No    Physically Abused: No    Sexually Abused: No    Review of Systems: Gen:+ Weight loss. CV: Denies chest pain, palpitations or edema. Resp: See HPI. GI: See HPI. No recent heartburn. + Burping.  GU : Denies urinary burning, blood in urine, increased urinary frequency or incontinence. MS: + right shoulder and back pain. Derm: Denies rash, itchiness, skin lesions or unhealing ulcers. Psych: Denies depression, anxiety, memory loss or confusion. Heme: + Easy bruising.  Neuro:  Denies headaches, dizziness or paresthesias. Endo:  Denies any problems with DM, thyroid or adrenal function.  Physical Exam: Vital signs in last 24 hours: Temp:  [98 F (36.7 C)-98.2 F (36.8 C)] 98 F (36.7 C) (05/07 1255) Pulse Rate:  [89-96] 89 (05/07 1200) Resp:  [21] 21 (05/07 1200) BP: (119-138)/(83-94) 138/94 (05/07 1200) SpO2:  [92 %-98 %] 98 % (05/07 1200) Weight:  [40.8 kg] 40.8 kg (05/07 0902)   General: Alert cachectic appearing chronically ill 67 year old male in no acute distress. Head:  Normocephalic and atraumatic. Eyes:  No scleral icterus. Conjunctiva pink. Ears:  Normal auditory acuity. Nose:  No deformity, discharge or lesions. Mouth:  Dentition intact. No ulcers or lesions.  Neck:  Supple. No lymphadenopathy or thyromegaly.  Lungs: Breath sounds diminished throughout. No wheezes, rhonchi or crackles. On oxygen 2L [place]. Heart:  Regular rate and rhythm, no murmurs. Abdomen: Nondistended.  Nontender.  Positive bowel sounds to all 4 quadrants.  No palpable mass.  Positive bowel sounds to all 4 quadrants.  No hepatosplenomegaly. Rectal: Deferred. Musculoskeletal:  Symmetrical without gross deformities.  Pulses:  Normal pulses noted. Extremities:  Without clubbing or edema. Neurologic:  Alert and  oriented x 4. No focal deficits.  Skin:  Intact without significant lesions or rashes. Psych:  Alert and cooperative. Normal mood and affect.  Intake/Output from previous day: No intake/output data recorded. Intake/Output this shift: Total I/O In: 1000 [IV Piggyback:1000] Out: -   Lab Results: Recent Labs    12/03/22 0928  WBC 16.2*  HGB 11.6*  HCT 34.5*  PLT 529*   BMET Recent Labs    12/03/22 0928  NA 128*  K 3.1*  CL 85*  CO2 30  GLUCOSE 132*  BUN 50*  CREATININE 1.75*  CALCIUM 9.6   LFT Recent Labs    12/03/22 0928  PROT 7.5  ALBUMIN 3.5  AST 17  ALT 15  ALKPHOS 66  BILITOT 0.7   PT/INR No results for input(s): "LABPROT", "  INR" in the last 72 hours. Hepatitis Panel No results for input(s): "HEPBSAG", "HCVAB", "HEPAIGM", "HEPBIGM" in the last 72 hours.    Studies/Results: DG Chest Portable 1 View  Result Date: 12/03/2022 CLINICAL DATA:  Provided history: Shortness of breath. History of stage IV lung cancer. EXAM: PORTABLE CHEST 1 VIEW COMPARISON:  Chest CT 09/30/2022. Prior chest radiographs 09/17/2022 and earlier. FINDINGS: Right chest infusion port catheter with tip at the level of the superior cavoatrial junction. The cardiomediastinal silhouette is unchanged. Aortic atherosclerosis. Small right pleural effusion. Small to moderate left pleural effusion with fluid at the left base and left apex. Underlying emphysema. Ill-defined opacities at the left lung base, slightly progressed from the prior examination of 09/17/2022. Levocurvature of the thoracic spine. Redemonstrated sclerotic  changes within the left scapula. IMPRESSION: 1. Small-to-moderate left pleural effusion (with fluid at both the left base and apex), mildly increased from the prior chest radiographs of 09/17/2022. 2. Small right pleural effusion, similar to the prior exam. 3. Ill-defined opacities at the left lung base mildly increased from the prior examination. This at least partly reflects atelectasis and scarring. However, superimposed pneumonia is difficult to exclude. Correlate clinically and consider short-interval radiographic follow-up. 4. Aortic Atherosclerosis (ICD10-I70.0) and Emphysema (ICD10-J43.9). Electronically Signed   By: Jackey Loge D.O.   On: 12/03/2022 10:56    IMPRESSION/PLAN:  67 year old male with dark brown to black diarrhea x 10 days with N/V with black watery emesis x 2 days. Patient vomited up dark coffee colored emesis in the ED. On PPI infusion.  -NPO -Ondansetron 4 mg IV every 6 hours as needed -Continue PPI infusion  -IV fluids per the hospitalist -Consider EGD during this hospitalization if respiratory status stable, patient is high risk for procedure complications -Await further recommendations per Dr. Russella Dar  Diarrhea -Check GI pathogen panel and C. Diff PCR if patient demonstrates active diarrhea   Chronic IDA, on IV iron infusions as needed. Hg 11.6 (Hg 9.7 on 09/30/2022). -Transfuse for Hg level < 8 -CBC in am  Leukocytosis, likely due to pneumonia +/- recent Medrol dose pak  COPD/emphysema and prior pneumothorax. Ground glass airspace opacity throughout the lung bases consistent with infection versus aspiration with severe underlying emphysema per CTA. On Oxygen 2 L [place]. On Azithromycin and Ceftriaxone IV.   History of metastatic non-small cell lung cancer with metastasis initially diagnosed 08/2018 treated with chemo and Keytruda, off treatment since around 06/2021.   New inferior endplate wedge deformity of T7.   -Management per the hospitalist   GERD -Continue PPI  infusion   CAD s/p coronary stent placement on Plavix and ASA. Last dose of Plavix was 9am on 5/5 -Hold Plavix   PAD s/p right iliac stent on Plavix and ASA. Last dose of Plavix was 9am on 5/5 -Hold Plavix   CKD stage III   Arnaldo Natal  12/03/2022, 2:53PM   Attending Physician Note   I have taken a history, reviewed the chart and examined the patient. I performed a substantive portion of this encounter, including complete performance of at least one of the key components, in conjunction with the APP. I agree with the APP's note, impression and recommendations with my edits. My additional impressions and recommendations are as follows.   Severe diarrhea, nausea, vomiting with dark emesis and heme negative stool. No clear evidence for significant GI bleeding. CT AP unremarkable. Suspect infectious process.  Stool for enteric pathogens, C diff. Started on Rocephin and Zithromax by primary service. Continue  IV pantoprazole and ondansetron. No plans for EGD at this time. If clear evidence of UGI bleeding occurs consider EGD however he is very high risk for anesthesia, procedures.   Chronic IDA receiving IV iron infusions. Hgb above his baseline, anticipate drop with IV hydration.   COPD  Metastatic non small cell lung cancer  CAD with stent and PVD with right iliac stent. Hold Plavix and ASA  CKD3  Claudette Head, MD [facility] See Loretha Stapler, [facility], for our on call provider

## 2022-12-03 NOTE — ED Notes (Signed)
ED TO INPATIENT HANDOFF REPORT  ED Nurse Name and Phone #: Linus Orn Name/Age/Gender Sean Rose 67 y.o. male Room/Bed: WA20/WA20  Code Status   Code Status: Full Code  Home/SNF/Other Home Patient oriented to: self, place, time, and situation Is this baseline? No   Triage Complete: Triage complete  Chief Complaint Hematemesis [K92.0]  Triage Note Pt BIBA from home for NVD x2 days, worse the last 24 hrs, noted dark stool. Denies abdominal pain, has chronic back pain. Started steroid taper 6 days ago. Hx stage 4 lung cx. No chemo in 2 years. 20gLF, 4mg  zof, 250cc NS  122/68 92% RA baseline CBG 159   Allergies Allergies  Allergen Reactions   Compazine [Prochlorperazine] Other (See Comments)    " made him high and could not sleep". He does not want to take it again.    Level of Care/Admitting Diagnosis ED Disposition     ED Disposition  Admit   Condition  --   Comment  Hospital Area: [facility] [[id number]]  Level of Care: Stepdown [14]  Admit to SDU based on following criteria: Hemodynamic compromise or significant risk of instability:  Patient requiring short term acute titration and management of vasoactive drips, and invasive monitoring (i.e., CVP and Arterial line).  May place patient in observation at [facility] or [facility] if equivalent level of care is available:: No  Covid Evaluation: Asymptomatic - no recent exposure (last 10 days) testing not required  Diagnosis: Hematemesis [578.0.ICD-9-CM]  Admitting Physician: Alba Cory [[id number]]  Attending Physician: Alba Cory [phone]          B Medical/Surgery History Past Medical History:  Diagnosis Date   Allergy    Anemia    Anginal pain (HCC)    Blood transfusion without reported diagnosis    CAD (coronary artery disease)    BMS to RCA 2007. Low risk nuclear stress 2020.   CAP (community acquired pneumonia) 09/2016   COPD (chronic obstructive pulmonary disease)  (HCC)    Dyspnea    occasionally   Emphysema of lung (HCC)    GERD (gastroesophageal reflux disease)    Heart murmur    "I was told I had one when I was a kid"   Hemorrhoids    History of anal fissures    "no surgeries" (10/30/2016)   Hyperlipidemia    Hypertension    lung ca dx'd 08/2018   with mets to bones in arms   Myocardial infarction [facility])    Peripheral arterial disease (HCC)    status post right common iliac artery stenting back in 2007   Seasonal allergies    Tobacco abuse    Past Surgical History:  Procedure Laterality Date   ANKLE SURGERY Left    "rebuilt it"   ANTERIOR CRUCIATE LIGAMENT REPAIR Right    CARDIAC CATHETERIZATION  09/23/2008   Continued medical therapy - may need GI evaluation in addition.   CARDIAC CATHETERIZATION  10/28/2007   Medical therapy recommended.   CARDIAC CATHETERIZATION  11/18/2006   In-stent restenosis RCA  (50% distal edge, 80% segmental mid, and 50-60% segmental proximal). Successful cutting balloon atherectomy using a [id number] cutting balloon. 3 inflations with atherectomy performed on mid and proximal portions resulting in reduction of 80% mid in-stent restenosis to less than 20% residual and 50-60% segmental proximal to less than 20% residual without dissection.   CARDIAC CATHETERIZATION  02/26/2006   Severe stenosis in RCA. Stenting performed using IVUS. 3.5x20 Maverick balloon deployed  at 6atm 34sec. Distal stent-a 4x28 Liberte stent-deployed 12atm 48sec, 12atm 31sec, 4atm 19sec. Mid stent-a 4x28 Liberte stent-deployed 14atm 45sec, 14atm 60sec, 14atm 44sec. Proximal stent-4x8 Liberte- 14atm 45sec,14atm 47sec, 16atm 43sec. Severely diseased segment then appeared TIMI-3 flow.   CARDIOVASCULAR STRESS TEST  11/17/2012   No significant ECG changes. Septal perfusion defect is new when complared to study from 2010. Abnormal myocardial perfusion imaging with a basal to mid perfusion suggestive of previous MI.   CAROTID DOPPLER  08/09/2011   Bilateral  Bulb/Proximal ICA - demonstrated a mild amount of fibrous plaque without evidence of significant diameter reduction reduction or other vascular abnormality.   CHEST TUBE INSERTION Right 09/02/2018   Procedure: Chest Tube Insertion;  Surgeon: Josephine Igo, DO;  Location: [facility] OR;  Service: Thoracic;  Laterality: Right;   COLONOSCOPY     2003, 2014   CORONARY ANGIOPLASTY WITH STENT PLACEMENT     FEMORAL ARTERY STENT     INGUINAL HERNIA REPAIR Right    IR IMAGING GUIDED PORT INSERTION  11/13/2020   KNEE ARTHROSCOPY Right "multiple"   LAPAROSCOPIC APPENDECTOMY N/A 12/07/2018   Procedure: APPENDECTOMY LAPAROSCOPIC;  Surgeon: Abigail Miyamoto, MD;  Location: [facility] ORS;  Service: General;  Laterality: N/A;   LOWER EXTREMITY ARTERIAL DOPPLER  01/31/2011   Bilateral ABIs-normal values with no suggestion of arterial insuff to the lower extremities at rest. 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 Right common iliac artery with an 8.0x18 mm Herculink stent deployed at 12 atm. Stenosis was reduced from 80% to 0% with brisk flow. b. I-cast stenting to left common iliac artery   PERIPHERAL VASCULAR CATHETERIZATION N/A 04/13/2015   Procedure: Lower Extremity Angiography;  Surgeon: Runell Gess, MD; L-oCIA 75%, 40-50% L-EIA, R-CIA stent patent, s/p 8 mm x 38 mm ICast covered stent>>0% stenosis in L-oCIA      SHOULDER ARTHROSCOPY WITH ROTATOR CUFF REPAIR Right    TRANSTHORACIC ECHOCARDIOGRAM  11/26/2012   EF not noted. Aortic valve-sclerosis without stenosis, no regurgiation.    UPPER GASTROINTESTINAL ENDOSCOPY     US CAROTID DOPPLER BILATERAL ([facility] HX)  08/09/2011   Bilateral Bulb/Proximal ICAa demonstrated a mild amount of fibrous plaque without evidence of significant diameter reduction or any other vascular abnormality.   VIDEO ASSISTED THORACOSCOPY  (VATS)/ LOBECTOMY Right 09/14/2018   Procedure: RIGHT VIDEO ASSISTED THORACOSCOPY (VATS)/ RIGHT UPPER LOBECTOMY;  Surgeon: Loreli Slot, MD;  Location: [facility] OR;  Service: Thoracic;  Laterality: Right;   VIDEO BRONCHOSCOPY WITH ENDOBRONCHIAL NAVIGATION N/A 09/02/2018   Procedure: VIDEO BRONCHOSCOPY WITH ENDOBRONCHIAL NAVIGATION;  Surgeon: Josephine Igo, DO;  Location: [facility] OR;  Service: Thoracic;  Laterality: N/A;   VIDEO BRONCHOSCOPY WITH INSERTION OF INTERBRONCHIAL VALVE (IBV) N/A 07/10/2022   Procedure: VIDEO BRONCHOSCOPY WITH INSERTION OF INTERBRONCHIAL VALVE (IBV);  Surgeon: Loreli Slot, MD;  Location: [facility] OR;  Service: Thoracic;  Laterality: N/A;   VIDEO BRONCHOSCOPY WITH INSERTION OF INTERBRONCHIAL VALVE (IBV) N/A 09/06/2022   Procedure: VIDEO BRONCHOSCOPY WITH REMOVAL OF INTRABRONCHIAL VALVE (IBV);  Surgeon: Loreli Slot, MD;  Location: [facility] OR;  Service: Thoracic;  Laterality: N/A;     A IV Location/Drains/Wounds Patient Lines/Drains/Airways Status     Active Line/Drains/Airways     Name Placement date Placement time Site Days   Implanted Port 11/13/20 Right Chest 11/13/20  1452  Chest  750  Peripheral IV 12/03/22 20 G Anterior;Left;Proximal Forearm 12/03/22  --  Forearm  less than 1            Intake/Output Last 24 hours  Intake/Output Summary (Last 24 hours) at 12/03/2022 1631 Last data filed at 12/03/2022 1213 Gross per 24 hour  Intake 1000 ml  Output --  Net 1000 ml    Labs/Imaging Results for orders placed or performed during the hospital encounter of 12/03/22 (from the past 48 hour(s))  Troponin I (High Sensitivity)     Status: None   Collection Time: 12/03/22  9:28 AM  Result Value Ref Range   Troponin I (High Sensitivity) 16 <18 ng/L    Comment: (NOTE) Elevated high sensitivity troponin I (hsTnI) values and significant  changes across serial measurements may suggest ACS but many other  chronic and acute conditions are known to elevate hsTnI  results.  Refer to the "Links" section for chest pain algorithms and additional  guidance. Performed at [facility], [address] [place], [place] [address]   Comprehensive metabolic panel     Status: Abnormal   Collection Time: 12/03/22  9:28 AM  Result Value Ref Range   Sodium 128 (L) 135 - 145 mmol/L   Potassium 3.1 (L) 3.5 - 5.1 mmol/L   Chloride 85 (L) 98 - 111 mmol/L   CO2 30 22 - 32 mmol/L   Glucose, Bld 132 (H) 70 - 99 mg/dL    Comment: Glucose reference range applies only to samples taken after fasting for at least 8 hours.   BUN 50 (H) 8 - 23 mg/dL   Creatinine, Ser 6.04 (H) 0.61 - 1.24 mg/dL   Calcium 9.6 8.9 - 54.0 mg/dL   Total Protein 7.5 6.5 - 8.1 g/dL   Albumin 3.5 3.5 - 5.0 g/dL   AST 17 15 - 41 U/L   ALT 15 0 - 44 U/L   Alkaline Phosphatase 66 38 - 126 U/L   Total Bilirubin 0.7 0.3 - 1.2 mg/dL   GFR, Estimated 42 (L) >60 mL/min    Comment: (NOTE) Calculated using the CKD-EPI Creatinine Equation (2021)    Anion gap 13 5 - 15    Comment: Performed at [facility], [address], [place], [place] [address]  CBC with Differential     Status: Abnormal   Collection Time: 12/03/22  9:28 AM  Result Value Ref Range   WBC 16.2 (H) 4.0 - 10.5 K/uL   RBC 3.76 (L) 4.22 - 5.81 MIL/uL   Hemoglobin 11.6 (L) 13.0 - 17.0 g/dL   HCT 14.7 (L) 82.9 - 56.2 %   MCV 91.8 80.0 - 100.0 fL   MCH 30.9 26.0 - 34.0 pg   MCHC 33.6 30.0 - 36.0 g/dL   RDW 13.0 (H) 86.5 - 78.4 %   Platelets 529 (H) 150 - 400 K/uL   nRBC 0.0 0.0 - 0.2 %   Neutrophils Relative % 89 %   Neutro Abs 14.3 (H) 1.7 - 7.7 K/uL   Lymphocytes Relative 7 %   Lymphs Abs 1.2 0.7 - 4.0 K/uL   Monocytes Relative 4 %   Monocytes Absolute 0.7 0.1 - 1.0 K/uL   Eosinophils Relative 0 %   Eosinophils Absolute 0.0 0.0 - 0.5 K/uL   Basophils Relative 0 %   Basophils Absolute 0.0 0.0 - 0.1 K/uL   Immature Granulocytes 0 %   Abs Immature Granulocytes 0.05 0.00 - 0.07 K/uL     Comment: Performed at  [facility], [address] [place], [place] [address]  Lipase, blood     Status: None   Collection Time: 12/03/22  9:28 AM  Result Value Ref Range   Lipase 22 11 - 51 U/L    Comment: Performed at [facility], [address] [place], [place] [address]  Magnesium     Status: Abnormal   Collection Time: 12/03/22  9:28 AM  Result Value Ref Range   Magnesium 2.5 (H) 1.7 - 2.4 mg/dL    Comment: Performed at [facility], [address] [place], [place] [address]  POC occult blood, ED Provider will collect     Status: None   Collection Time: 12/03/22  9:34 AM  Result Value Ref Range   Fecal Occult Bld NEGATIVE NEGATIVE  Troponin I (High Sensitivity)     Status: None   Collection Time: 12/03/22 12:47 PM  Result Value Ref Range   Troponin I (High Sensitivity) 16 <18 ng/L    Comment: (NOTE) Elevated high sensitivity troponin I (hsTnI) values and significant  changes across serial measurements may suggest ACS but many other  chronic and acute conditions are known to elevate hsTnI results.  Refer to the "Links" section for chest pain algorithms and additional  guidance. Performed at [facility], [address], [place], [place] [address]    CT ABDOMEN PELVIS W CONTRAST  Result Date: 12/03/2022 CLINICAL DATA:  Bowel obstruction suspected, persistent vomiting and hematemesis metastatic lung cancer * Tracking Code: BO * EXAM: CT ABDOMEN AND PELVIS WITH CONTRAST TECHNIQUE: Multidetector CT imaging of the abdomen and pelvis was performed using the standard protocol following bolus administration of intravenous contrast. RADIATION DOSE REDUCTION: This exam was performed according to the departmental dose-optimization program which includes automated exposure control, adjustment of the mA and/or kV according to patient size and/or use of iterative reconstruction technique. CONTRAST:  80mL OMNIPAQUE  IOHEXOL 350 MG/ML SOLN COMPARISON:  CT chest abdomen pelvis, 09/30/2022 FINDINGS: Lower chest: Please see separately reported examination of the chest. Hepatobiliary: No solid liver abnormality is seen. No gallstones, gallbladder wall thickening, or biliary dilatation. Pancreas: Unremarkable. No pancreatic ductal dilatation or surrounding inflammatory changes. Spleen: Normal in size without significant abnormality. Adrenals/Urinary Tract: Adrenal glands are unremarkable. Kidneys are normal, without renal calculi, solid lesion, or hydronephrosis. Bladder is unremarkable. Stomach/Bowel: The gastric fundus is fluid-filled, thickening of the gastric body and antrum appearance similar to prior examination (series 19, image 20). Appendix is not clearly visualized and may be surgically absent. No evidence of bowel wall thickening, distention, or inflammatory changes. Vascular/Lymphatic: Severe aortic atherosclerosis. Bilateral common iliac artery stents. No enlarged abdominal or pelvic lymph nodes. Reproductive: No mass or other significant abnormality. Other: No abdominal wall hernia or abnormality. No ascites. Musculoskeletal: New superior endplate deformity of L4 (series 26, image 71). IMPRESSION: 1. No acute CT findings of the abdomen or pelvis to explain vomiting or hematemesis. No evidence of bowel obstruction. 2. The gastric fundus is fluid-filled, thickening of the gastric body and antrum appearance similar to prior examination. Consider endoscopic evaluation. 3. New superior endplate deformity of L4 correlate poor acute pain and point tenderness. Aortic Atherosclerosis (ICD10-I70.0). Electronically Signed   By: Jearld Lesch M.D.   On: 12/03/2022 13:46   CT Angio Chest PE W and/or Wo Contrast  Result Date: 12/03/2022 CLINICAL DATA:  PE suspected, metastatic lung cancer * Tracking Code: BO * EXAM: CT ANGIOGRAPHY CHEST WITH CONTRAST TECHNIQUE: Multidetector CT imaging of  the chest was performed using the standard  protocol during bolus administration of intravenous contrast. Multiplanar CT image reconstructions and MIPs were obtained to evaluate the vascular anatomy. RADIATION DOSE REDUCTION: This exam was performed according to the departmental dose-optimization program which includes automated exposure control, adjustment of the mA and/or kV according to patient size and/or use of iterative reconstruction technique. CONTRAST:  80mL OMNIPAQUE IOHEXOL 350 MG/ML SOLN COMPARISON:  09/30/2022 FINDINGS: Cardiovascular: Satisfactory opacification of the pulmonary arteries to the segmental level. No evidence of pulmonary embolism. Normal heart size. Left and right coronary artery calcifications. No pericardial effusion. Aortic atherosclerosis. Mediastinum/Nodes: Unchanged subcarinal lymph node, poorly visualized on this early arterial phase examination (series 9, image 70). Fluid-filled esophagus. Thyroid gland and trachea demonstrate no significant findings. Lungs/Pleura: Status post right upper lobectomy. Severe underlying emphysema. Unchanged moderate right, small left pleural effusions, likely chronic and loculated, with evidence of prior left-sided talc pleurodesis. New heterogeneous and ground-glass airspace opacity throughout the lung bases, particularly the left lung base (series 17, image 103). Upper Abdomen: No acute abnormality. Musculoskeletal: No chest wall abnormality. New inferior endplate wedge deformity of T7 (series 13, image 83). Unchanged mild superior endplate deformity of T11 (series 13, image 83). Review of the MIP images confirms the above findings. IMPRESSION: 1. Negative examination for pulmonary embolism. 2. New heterogeneous and ground-glass airspace opacity throughout the lung bases, particularly the left lung base, consistent with infection or aspiration. 3. Unchanged moderate right, small left pleural effusions, likely chronic and loculated, with evidence of prior left-sided talc pleurodesis. 4.  Status post right upper lobectomy. 5. Severe underlying emphysema. 6. Coronary artery disease. 7. New inferior endplate wedge deformity of T7. Correlate for acutely referable pain and point tenderness. Aortic Atherosclerosis (ICD10-I70.0) and Emphysema (ICD10-J43.9). Electronically Signed   By: Jearld Lesch M.D.   On: 12/03/2022 13:38   DG Chest Portable 1 View  Result Date: 12/03/2022 CLINICAL DATA:  Provided history: Shortness of breath. History of stage IV lung cancer. EXAM: PORTABLE CHEST 1 VIEW COMPARISON:  Chest CT 09/30/2022. Prior chest radiographs 09/17/2022 and earlier. FINDINGS: Right chest infusion port catheter with tip at the level of the superior cavoatrial junction. The cardiomediastinal silhouette is unchanged. Aortic atherosclerosis. Small right pleural effusion. Small to moderate left pleural effusion with fluid at the left base and left apex. Underlying emphysema. Ill-defined opacities at the left lung base, slightly progressed from the prior examination of 09/17/2022. Levocurvature of the thoracic spine. Redemonstrated sclerotic changes within the left scapula. IMPRESSION: 1. Small-to-moderate left pleural effusion (with fluid at both the left base and apex), mildly increased from the prior chest radiographs of 09/17/2022. 2. Small right pleural effusion, similar to the prior exam. 3. Ill-defined opacities at the left lung base mildly increased from the prior examination. This at least partly reflects atelectasis and scarring. However, superimposed pneumonia is difficult to exclude. Correlate clinically and consider short-interval radiographic follow-up. 4. Aortic Atherosclerosis (ICD10-I70.0) and Emphysema (ICD10-J43.9). Electronically Signed   By: Jackey Loge D.O.   On: 12/03/2022 10:56    Pending Labs Unresulted Labs (From admission, onward)     Start     Ordered   12/04/22 0500  Comprehensive metabolic panel  Tomorrow morning,   R        12/03/22 1610   12/04/22 0500   Protime-INR  Tomorrow morning,   R        12/03/22 1610   12/04/22 0500  CBC  Tomorrow morning,   R  12/03/22 1611   12/03/22 1558  Hemoglobin and hematocrit, blood  Now then every 8 hours,   R (with TIMED occurrences)      12/03/22 1557   12/03/22 1518  C Difficile Quick Screen w PCR reflex  (C Difficile quick screen w PCR reflex panel )  Once, for 24 hours,   TIMED       References:    CDiff Information Tool   12/03/22 1517   12/03/22 1518  Gastrointestinal Panel by PCR , Stool  (Gastrointestinal Panel by PCR, Stool                                                                                                                                                     **Does Not include CLOSTRIDIUM DIFFICILE testing. **If CDIFF testing is needed, place order from the "C Difficile Testing" order set.**)  Once,   R        12/03/22 1517            Vitals/Pain Today's Vitals   12/03/22 1400 12/03/22 1430 12/03/22 1500 12/03/22 1530  BP: (!) 137/97 (!) 134/95 (!) 138/93 127/72  Pulse: 100 100 94 (!) 104  Resp: (!) 27 19 (!) 21 16  Temp:      TempSrc:      SpO2: 97% 95% 100% 97%  Weight:      Height:      PainSc:        Isolation Precautions Enteric precautions (UV disinfection)  Medications Medications  pantoprozole (PROTONIX) 80 mg /NS 100 mL infusion (8 mg/hr Intravenous New Bag/Given 12/03/22 1307)  pantoprazole (PROTONIX) injection 40 mg (has no administration in time range)  azithromycin (ZITHROMAX) 500 mg in sodium chloride 0.9 % 250 mL IVPB (500 mg Intravenous New Bag/Given 12/03/22 1552)  doxylamine (Sleep) (UNISOM) tablet 25 mg (has no administration in time range)  fluticasone furoate-vilanterol (BREO ELLIPTA) 100-25 MCG/ACT 1 puff (has no administration in time range)    And  umeclidinium bromide (INCRUSE ELLIPTA) 62.5 MCG/ACT 1 puff (has no administration in time range)  sodium chloride flush (NS) 0.9 % injection 3 mL (has no administration in time range)  sodium  chloride flush (NS) 0.9 % injection 3 mL (has no administration in time range)  0.9 %  sodium chloride infusion (has no administration in time range)  acetaminophen (TYLENOL) tablet 650 mg (has no administration in time range)    Or  acetaminophen (TYLENOL) suppository 650 mg (has no administration in time range)  methocarbamol (ROBAXIN) 500 mg in dextrose 5 % 50 mL IVPB (has no administration in time range)  ondansetron (ZOFRAN) tablet 4 mg ( Oral See Alternative 12/03/22 1618)    Or  ondansetron (ZOFRAN) injection 4 mg (4 mg Intravenous Given 12/03/22 1618)  metoprolol tartrate (LOPRESSOR) injection 5 mg (has no administration in time range)  potassium chloride 10 mEq in 100 mL IVPB (has no administration in time range)  dextrose 5 %-0.9 % sodium chloride infusion (has no administration in time range)  cefTRIAXone (ROCEPHIN) 2 g in sodium chloride 0.9 % 100 mL IVPB (has no administration in time range)  azithromycin (ZITHROMAX) 500 mg in sodium chloride 0.9 % 250 mL IVPB (has no administration in time range)  LORazepam (ATIVAN) injection 0.5 mg (has no administration in time range)  HYDROmorphone (DILAUDID) injection 0.5 mg (has no administration in time range)  guaiFENesin-dextromethorphan (ROBITUSSIN DM) 100-10 MG/5ML syrup 5 mL (has no administration in time range)  pantoprazole (PROTONIX) 80 mg /NS 100 mL IVPB (0 mg Intravenous Stopped 12/03/22 1305)  famotidine (PEPCID) IVPB 20 mg premix (0 mg Intravenous Stopped 12/03/22 1124)  sodium chloride 0.9 % bolus 1,000 mL (0 mLs Intravenous Stopped 12/03/22 1213)  ondansetron (ZOFRAN) injection 4 mg (4 mg Intravenous Given 12/03/22 1054)  iohexol (OMNIPAQUE) 350 MG/ML injection 80 mL (80 mLs Intravenous Contrast Given 12/03/22 1210)  cefTRIAXone (ROCEPHIN) 1 g in sodium chloride 0.9 % 100 mL IVPB (0 g Intravenous Stopped 12/03/22 1509)    Mobility Pt stated he feels too weak to stand/walk     Focused Assessments Cardiac Assessment Handoff:    Lab  Results  Component Value Date   TROPONINI <0.03 07/08/2018   Lab Results  Component Value Date   DDIMER 2.48 (H) 03/14/2022   Does the Patient currently have chest pain? No    R Recommendations: See Admitting Provider Note  Report given to:   Additional Notes: Pt cannot ambulate/orthostatic episodes per pt. Spouse at bedside.

## 2022-12-03 NOTE — ED Provider Notes (Signed)
[facility] EMERGENCY DEPARTMENT AT [facility] Provider Note   CSN: [id number] Arrival date & time: 12/03/22  0846     History  Chief Complaint  Patient presents with   Emesis   Diarrhea    Sean Rose is a 67 y.o. male with a history of CAD, hypertension, PAD, prior tobacco use, COPD, stage IV lung cancer last chemotherapy was 2 years ago presents today for evaluation of vomiting and diarrhea.  Patient reports watery diarrhea in the last 7 days.  He reports having dark watery diarrhea in the last couple days.  He also reports cough and of of black phlegm in the last 3 or 4 days.  Patient reports generalized weakness and fatigue.  Denies fever, nausea, vomiting, chest pain or shortness of breath.  States he lost 100 pounds in the last 1 year after chemo treatment.   Emesis Associated symptoms: diarrhea   Diarrhea Associated symptoms: vomiting       Past Medical History:  Diagnosis Date   Allergy    Anemia    Anginal pain (HCC)    Blood transfusion without reported diagnosis    CAD (coronary artery disease)    BMS to RCA 2007. Low risk nuclear stress 2020.   CAP (community acquired pneumonia) 09/2016   COPD (chronic obstructive pulmonary disease) (HCC)    Dyspnea    occasionally   Emphysema of lung (HCC)    GERD (gastroesophageal reflux disease)    Heart murmur    "I was told I had one when I was a kid"   Hemorrhoids    History of anal fissures    "no surgeries" (10/30/2016)   Hyperlipidemia    Hypertension    lung ca dx'd 08/2018   with mets to bones in arms   Myocardial infarction [facility])    Peripheral arterial disease (HCC)    status post right common iliac artery stenting back in 2007   Seasonal allergies    Tobacco abuse    Past Surgical History:  Procedure Laterality Date   ANKLE SURGERY Left    "rebuilt it"   ANTERIOR CRUCIATE LIGAMENT REPAIR Right    CARDIAC CATHETERIZATION  09/23/2008   Continued medical therapy - may need GI evaluation in  addition.   CARDIAC CATHETERIZATION  10/28/2007   Medical therapy recommended.   CARDIAC CATHETERIZATION  11/18/2006   In-stent restenosis RCA  (50% distal edge, 80% segmental mid, and 50-60% segmental proximal). Successful cutting balloon atherectomy using a [id number] cutting balloon. 3 inflations with atherectomy performed on mid and proximal portions resulting in reduction of 80% mid in-stent restenosis to less than 20% residual and 50-60% segmental proximal to less than 20% residual without dissection.   CARDIAC CATHETERIZATION  02/26/2006   Severe stenosis in RCA. Stenting performed using IVUS. 3.5x20 Maverick balloon deployed at [employer]. Distal stent-a 4x28 Liberte stent-deployed 12atm 48sec, 12atm 31sec, 4atm 19sec. Mid stent-a 4x28 Liberte stent-deployed 14atm 45sec, 14atm 60sec, 14atm 44sec. Proximal stent-4x8 Liberte- 14atm 45sec,14atm 47sec, 16atm 43sec. Severely diseased segment then appeared TIMI-3 flow.   CARDIOVASCULAR STRESS TEST  11/17/2012   No significant ECG changes. Septal perfusion defect is new when complared to study from 2010. Abnormal myocardial perfusion imaging with a basal to mid perfusion suggestive of previous MI.   CAROTID DOPPLER  08/09/2011   Bilateral Bulb/Proximal ICA - demonstrated a mild amount of fibrous plaque without evidence of significant diameter reduction reduction or other vascular abnormality.   CHEST TUBE INSERTION Right 09/02/2018  Procedure: Chest Tube Insertion;  Surgeon: Josephine Igo, DO;  Location: [facility] OR;  Service: Thoracic;  Laterality: Right;   COLONOSCOPY     2003, 2014   CORONARY ANGIOPLASTY WITH STENT PLACEMENT     FEMORAL ARTERY STENT     INGUINAL HERNIA REPAIR Right    IR IMAGING GUIDED PORT INSERTION  11/13/2020   KNEE ARTHROSCOPY Right "multiple"   LAPAROSCOPIC APPENDECTOMY N/A 12/07/2018   Procedure: APPENDECTOMY LAPAROSCOPIC;  Surgeon: Abigail Miyamoto, MD;  Location: [facility] ORS;  Service: General;  Laterality: N/A;   LOWER EXTREMITY  ARTERIAL DOPPLER  01/31/2011   Bilateral ABIs-normal values with no suggestion of arterial insuff to the lower extremities at rest. 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 Right common iliac artery with an 8.0x18 mm Herculink stent deployed at 12 atm. Stenosis was reduced from 80% to 0% with brisk flow. b. I-cast stenting to left common iliac artery   PERIPHERAL VASCULAR CATHETERIZATION N/A 04/13/2015   Procedure: Lower Extremity Angiography;  Surgeon: Runell Gess, MD; L-oCIA 75%, 40-50% L-EIA, R-CIA stent patent, s/p 8 mm x 38 mm ICast covered stent>>0% stenosis in L-oCIA      SHOULDER ARTHROSCOPY WITH ROTATOR CUFF REPAIR Right    TRANSTHORACIC ECHOCARDIOGRAM  11/26/2012   EF not noted. Aortic valve-sclerosis without stenosis, no regurgiation.    UPPER GASTROINTESTINAL ENDOSCOPY     US CAROTID DOPPLER BILATERAL ([facility] HX)  08/09/2011   Bilateral Bulb/Proximal ICAa demonstrated a mild amount of fibrous plaque without evidence of significant diameter reduction or any other vascular abnormality.   VIDEO ASSISTED THORACOSCOPY (VATS)/ LOBECTOMY Right 09/14/2018   Procedure: RIGHT VIDEO ASSISTED THORACOSCOPY (VATS)/ RIGHT UPPER LOBECTOMY;  Surgeon: Loreli Slot, MD;  Location: [facility] OR;  Service: Thoracic;  Laterality: Right;   VIDEO BRONCHOSCOPY WITH ENDOBRONCHIAL NAVIGATION N/A 09/02/2018   Procedure: VIDEO BRONCHOSCOPY WITH ENDOBRONCHIAL NAVIGATION;  Surgeon: Josephine Igo, DO;  Location: [facility] OR;  Service: Thoracic;  Laterality: N/A;   VIDEO BRONCHOSCOPY WITH INSERTION OF INTERBRONCHIAL VALVE (IBV) N/A 07/10/2022   Procedure: VIDEO BRONCHOSCOPY WITH INSERTION OF INTERBRONCHIAL VALVE (IBV);  Surgeon: Loreli Slot, MD;  [facility];  Service: Thoracic;  Laterality: N/A;   VIDEO BRONCHOSCOPY WITH INSERTION OF INTERBRONCHIAL VALVE  (IBV) N/A 09/06/2022   Procedure: VIDEO BRONCHOSCOPY WITH REMOVAL OF INTRABRONCHIAL VALVE (IBV);  Surgeon: Loreli Slot, MD;  Location: [facility] OR;  Service: Thoracic;  Laterality: N/A;     Home Medications Prior to Admission medications   Medication Sig Start Date End Date Taking? Authorizing Provider  amLODipine (NORVASC) 5 MG tablet TAKE 1 TABLET BY MOUTH DAILY 06/24/22   Runell Gess, MD  aspirin EC 81 MG tablet Take 81 mg by mouth daily.    [provider]  clopidogrel (PLAVIX) 75 MG tablet TAKE 1 TABLET BY MOUTH EVERY DAY 09/23/22   Runell Gess, MD  cyclobenzaprine (FLEXERIL) 10 MG tablet Take 1 tablet (10 mg total) by mouth 3 (three) times daily. 11/27/22   Nafziger, Kandee Keen, NP  diclofenac Sodium (VOLTAREN) 1 % GEL Apply 2 g topically 4 (four) times daily. 11/20/22   Nafziger, Kandee Keen, NP  doxylamine, Sleep, (UNISOM) 25 MG tablet Take 25 mg by mouth at bedtime as needed for sleep.    [provider]  esomeprazole (NEXIUM) 20 MG capsule Take 40 mg by mouth daily.  [provider]  fexofenadine (ALLEGRA) 180 MG tablet Take 180 mg by mouth daily.    [provider]  folic acid (FOLVITE) 1 MG tablet Take 1 tablet (1 mg total) by mouth daily. 06/26/22   Gold, Glenice Laine, PA-C  Magnesium 500 MG TABS Take 1 tablet (500 mg total) by mouth in the morning and at bedtime. 06/26/22   Rowe Clack, PA-C  methylPREDNISolone (MEDROL DOSEPAK) 4 MG TBPK tablet Take as directed 11/27/22   [name], NP  metoprolol succinate (TOPROL-XL) 25 MG 24 hr tablet TAKE 1 TABLET BY MOUTH DAILY 08/23/22   Runell Gess, MD  mirtazapine (REMERON) 15 MG tablet TAKE 1 TABLET(15 MG) BY MOUTH AT BEDTIME 06/26/22   Nafziger, Kandee Keen, NP  Multiple Vitamin (MULTIVITAMIN WITH MINERALS) TABS tablet Take 1 tablet by mouth daily.    [provider]  rosuvastatin (CRESTOR) 5 MG tablet TAKE 1 TABLET(5 MG) BY MOUTH EVERY OTHER DAY 09/23/22   Runell Gess, MD  TRELEGY ELLIPTA  100-62.5-25 MCG/ACT AEPB INHALE 1 PUFF INTO THE LUNGS DAILY 10/22/22   [name], NP      Allergies    Compazine [prochlorperazine]    Review of Systems   Review of Systems  Gastrointestinal:  Positive for diarrhea and vomiting.    Physical Exam Updated Vital Signs BP 119/83 (BP Location: Right Arm)   Pulse 96   Temp 98.2 F (36.8 C) (Oral)   Resp (!) 21   Ht 5\' 8"  (1.727 m)   Wt 40.8 kg   SpO2 92%   BMI 13.68 kg/m  Physical Exam Vitals and nursing note reviewed.  Constitutional:      Appearance: He is ill-appearing.  HENT:     Head: Normocephalic and atraumatic.     Mouth/Throat:     Mouth: Mucous membranes are dry.  Eyes:     General: No scleral icterus. Cardiovascular:     Rate and Rhythm: Normal rate and regular rhythm.     Pulses: Normal pulses.     Heart sounds: Normal heart sounds.  Pulmonary:     Effort: Pulmonary effort is normal.     Breath sounds: Normal breath sounds.  Abdominal:     General: Abdomen is flat.     Palpations: Abdomen is soft.     Tenderness: There is no abdominal tenderness.  Musculoskeletal:        General: No deformity.  Skin:    General: Skin is warm.     Findings: No rash.  Neurological:     General: No focal deficit present.     Mental Status: He is alert.  Psychiatric:        Mood and Affect: Mood normal.     ED Results / Procedures / Treatments   Labs (all labs ordered are listed, but only abnormal results are displayed) Labs Reviewed  COMPREHENSIVE METABOLIC PANEL  CBC WITH DIFFERENTIAL/PLATELET  LIPASE, BLOOD  MAGNESIUM  POC OCCULT BLOOD, ED  TROPONIN I (HIGH SENSITIVITY)    EKG None  Radiology No results found.  Procedures Procedures    Medications Ordered in ED Medications - No data to display  ED Course/ Medical Decision Making/ A&P Clinical Course as of 12/03/22 1357  Tue Dec 03, 2022  1032 This is a 67 year old gentleman with a history of lung cancer, recurring hydropneumothorax,  presenting to ED with complaint of vomiting and dark vomit.  This began yesterday evening.  The patient has dark tinge, coffee-ground emesis.  He feels  nauseated and has burning epigastric pain.  He denies any prior issues with upper GI bleeds that he is aware of, but did have gastric fold thickening on CT scan performed March 2024 per my review.  On my exam he is afebrile, heart rate and blood pressure within normal limits, but he is hypoxic, 84% on room air, stabilized on 4L [place].  Hemoglobin appears stable at 11.6.  I ordered Protonix and Pepcid and nausea medication for suspected upper GI bleed.  I ordered an x-ray to evaluate for recurrent pleural effusion versus pneumothorax.  Patient may require more advanced imaging of an x-ray cannot well-delineated this issue, given the complexity of his prior hydropneumothoraces. [MT]  1034 The patient does have some nonspecific EKG changes with some T wave inversions in the anterior leads, but no active chest pain or pressure, and a troponin initially that is normal at 16. [MT]  1343 CT concerning for pneumonia, small to moderate sided pleural effusion.  No pulmonary embolism.  Will plan to initiate antibiotics admit the patient to the hospital -- PA provider to consult with GI regarding suspected upper GI bleed.  Lower suspicion for sepsis at this time clinically. [MT]    Clinical Course User Index [MT] Trifan, Kermit Balo, MD                             Medical Decision Making Amount and/or Complexity of Data Reviewed Labs: ordered. Radiology: ordered.  Risk Prescription drug management. Decision regarding hospitalization.   This patient presents to the ED for hematemesis, diarrhea, this involves an extensive number of treatment options, and is a complaint that carries with a high risk of complications and morbidity.  The differential diagnosis includes upper GI bleed, SBO, diverticulitis, diverticulosis, c diff, pneumonia, pleural effusion,  pneumothorax, infectious etiology.  This is not an exhaustive list.  Lab tests: I ordered and personally interpreted labs.  The pertinent results include: WBC 16.2. Hbg unremarkable. Platelets unremarkable. Electrolytes with K 3.1, sodium 128. BUN 50, creatinine 1.7. Fecal occult negative,   Imaging studies: I ordered imaging studies. I personally reviewed, interpreted imaging and agree with the radiologist's interpretations. The results include:  CT angio: New heterogeneous and ground-glass airspace opacity throughout the lung bases, particularly the left lung base, consistent with infection or aspiration. Unchanged moderate right, small left pleural effusions, likely chronic and loculated, with evidence of prior left-sided talc pleurodesis. Status post right upper lobectomy. Severe underlying emphysema.  Coronary artery disease. New inferior endplate wedge deformity of T7. Correlate for acutely referable pain and point tenderness.  CT abdomen Pelvis:  No acute CT findings of the abdomen or pelvis to explain vomiting or hematemesis. No evidence of bowel obstruction. The gastric fundus is fluid-filled, thickening of the gastric body and antrum appearance similar to prior examination. Consider endoscopic evaluation.   Problem list/ ED course/ Critical interventions/ Medical management: HPI: See above Vital signs with hypoxia 86% on RA, placed on 4L of oxygen by the attending. Otherwise, within normal range and stable throughout visit. Laboratory/imaging studies significant for: See above. On physical examination, patient is afebrile and is ill appearing.  CT angio significant for pneumonia and unchanged moderate right and small left pleural effusions.  No evidence of PE on the examination that would explain the hypoxia.  I will wean patient off of oxygen and O2 sat dipped to 90%.  Patient was placed back on 2 L nasal cannula.  CT abdomen pelvis  showed no acute findings of the abdomen or pelvis to  explain vomiting or hematemesis.  I think patient will benefit from admission for further upper GI bleeding workup.  Low suspicion for sepsis at this time clinically. Given IV fluid, Rocephin and Zithromax, Protonix, Pepcid and Zofran.  Will consult GI and hospitalist. I have reviewed the patient home medicines and have made adjustments as needed.  Cardiac monitoring/EKG: The patient was maintained on a cardiac monitor.  I personally reviewed and interpreted the cardiac monitor which showed an underlying rhythm of: sinus rhythm.  Additional history obtained: External records from outside source obtained and reviewed including: Chart review including previous notes, labs, imaging.  Consultations obtained: I requested consultation with Katrinka Blazing, PA-C from [facility], discussed labs and pending plans.  She recommended admission and GI will follow-up in consultation. I requested consultation with Dr. Sunnie Nielsen [facility] hospitalist who spoke to Dr. Pryor Ochoa, She will admit the patient.  Disposition Admit.  This chart was dictated using voice recognition software.  Despite best efforts to proofread,  errors can occur which can change the documentation meaning.          Final Clinical Impression(s) / ED Diagnoses Final diagnoses:  Hematemesis with nausea    Rx / DC Orders ED Discharge Orders     None         Jeanelle Malling, [place] 12/04/22 1023    Terald Sleeper, MD 12/04/22 1539

## 2022-12-03 NOTE — H&P (Addendum)
History and Physical    Patient: Sean Rose:[id number] DOB: [birth year] DOA: 12/03/2022 DOS: the patient was seen and examined on 12/03/2022 PCP: [name], NP  Patient coming from: Home  Chief Complaint:  Chief Complaint  Patient presents with   Emesis   Diarrhea   HPI: Sean Rose is a 67 y.o. male with medical history significant of hypertension, hyperlipidemia, CAD status post stent placement, PAD status post iliac stent on Plavix, a stage IV metastatic non-small cell lung cancer treated with [employer] and Keytruda, COPD stage IV, history of right pneumothorax status post bronchoscopy for intrabronchial valve placement 09/06/2022, iron deficiency anemia who presents complaining of nausea, vomiting, coffee-ground emesis, cough, diarrhea.  Patient reports cough with dark sputum very thick phlegm over the last 2 days prior to admission.  He has been also vomiting coffee-ground emesis for the last 2 days.  He has been having diarrhea watery over a week, over the last 2 days his stool has become dark.  He just completed a course of prednisone, he is a muscle relaxant and oxycodone for back pain.  Evaluation in the ED: Sodium 128 potassium 3.1, BUN 50, creatinine 1.7, troponin 16, hemoglobin 11, white blood cell 16, platelets 529, fecal occult negative,  CT angio: New heterogeneous and ground-glass airspace opacity throughout the lung bases, particularly the left lung base, consistent with infection or aspiration. Unchanged moderate right, small left pleural effusions, likely chronic and loculated, with evidence of prior left-sided talc pleurodesis. Status post right upper lobectomy. Severe underlying emphysema.  Coronary artery disease. New inferior endplate wedge deformity of T7. Correlate for acutely referable pain and point tenderness.  CT abdomen Pelvis:  No acute CT findings of the abdomen or pelvis to explain vomiting or hematemesis. No evidence of bowel obstruction. The gastric  fundus is fluid-filled, thickening of the gastric body and antrum appearance similar to prior examination. Consider endoscopic evaluation.      Review of Systems: As mentioned in the history of present illness. All other systems reviewed and are negative. Past Medical History:  Diagnosis Date   Allergy    Anemia    Anginal pain (HCC)    Blood transfusion without reported diagnosis    CAD (coronary artery disease)    BMS to RCA 2007. Low risk nuclear stress 2020.   CAP (community acquired pneumonia) 09/2016   COPD (chronic obstructive pulmonary disease) (HCC)    Dyspnea    occasionally   Emphysema of lung (HCC)    GERD (gastroesophageal reflux disease)    Heart murmur    "I was told I had one when I was a kid"   Hemorrhoids    History of anal fissures    "no surgeries" (10/30/2016)   Hyperlipidemia    Hypertension    lung ca dx'd 08/2018   with mets to bones in arms   Myocardial infarction [facility])    Peripheral arterial disease (HCC)    status post right common iliac artery stenting back in 2007   Seasonal allergies    Tobacco abuse    Past Surgical History:  Procedure Laterality Date   ANKLE SURGERY Left    "rebuilt it"   ANTERIOR CRUCIATE LIGAMENT REPAIR Right    CARDIAC CATHETERIZATION  09/23/2008   Continued medical therapy - may need GI evaluation in addition.   CARDIAC CATHETERIZATION  10/28/2007   Medical therapy recommended.   CARDIAC CATHETERIZATION  11/18/2006   In-stent restenosis RCA  (50% distal edge, 80% segmental mid, and  50-60% segmental proximal). Successful cutting balloon atherectomy using a [id number] cutting balloon. 3 inflations with atherectomy performed on mid and proximal portions resulting in reduction of 80% mid in-stent restenosis to less than 20% residual and 50-60% segmental proximal to less than 20% residual without dissection.   CARDIAC CATHETERIZATION  02/26/2006   Severe stenosis in RCA. Stenting performed using IVUS. 3.5x20 Maverick balloon deployed  at [employer]. Distal stent-a 4x28 Liberte stent-deployed 12atm 48sec, 12atm 31sec, 4atm 19sec. Mid stent-a 4x28 Liberte stent-deployed 14atm 45sec, 14atm 60sec, 14atm 44sec. Proximal stent-4x8 Liberte- 14atm 45sec,14atm 47sec, 16atm 43sec. Severely diseased segment then appeared TIMI-3 flow.   CARDIOVASCULAR STRESS TEST  11/17/2012   No significant ECG changes. Septal perfusion defect is new when complared to study from 2010. Abnormal myocardial perfusion imaging with a basal to mid perfusion suggestive of previous MI.   CAROTID DOPPLER  08/09/2011   Bilateral Bulb/Proximal ICA - demonstrated a mild amount of fibrous plaque without evidence of significant diameter reduction reduction or other vascular abnormality.   CHEST TUBE INSERTION Right 09/02/2018   Procedure: Chest Tube Insertion;  Surgeon: Josephine Igo, DO;  Location: [facility] OR;  Service: Thoracic;  Laterality: Right;   COLONOSCOPY     2003, 2014   CORONARY ANGIOPLASTY WITH STENT PLACEMENT     FEMORAL ARTERY STENT     INGUINAL HERNIA REPAIR Right    IR IMAGING GUIDED PORT INSERTION  11/13/2020   KNEE ARTHROSCOPY Right "multiple"   LAPAROSCOPIC APPENDECTOMY N/A 12/07/2018   Procedure: APPENDECTOMY LAPAROSCOPIC;  Surgeon: Abigail Miyamoto, MD;  Location: [facility] ORS;  Service: General;  Laterality: N/A;   LOWER EXTREMITY ARTERIAL DOPPLER  01/31/2011   Bilateral ABIs-normal values with no suggestion of arterial insuff to the lower extremities at rest. 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 Right common iliac artery with an 8.0x18 mm Herculink stent deployed at 12 atm. Stenosis was reduced from 80% to 0% with brisk flow. b. I-cast stenting to left common iliac artery   PERIPHERAL VASCULAR CATHETERIZATION N/A 04/13/2015   Procedure: Lower Extremity Angiography;  Surgeon: Runell Gess, MD;  L-oCIA 75%, 40-50% L-EIA, R-CIA stent patent, s/p 8 mm x 38 mm ICast covered stent>>0% stenosis in L-oCIA      SHOULDER ARTHROSCOPY WITH ROTATOR CUFF REPAIR Right    TRANSTHORACIC ECHOCARDIOGRAM  11/26/2012   EF not noted. Aortic valve-sclerosis without stenosis, no regurgiation.    UPPER GASTROINTESTINAL ENDOSCOPY     US CAROTID DOPPLER BILATERAL ([facility] HX)  08/09/2011   Bilateral Bulb/Proximal ICAa demonstrated a mild amount of fibrous plaque without evidence of significant diameter reduction or any other vascular abnormality.   VIDEO ASSISTED THORACOSCOPY (VATS)/ LOBECTOMY Right 09/14/2018   Procedure: RIGHT VIDEO ASSISTED THORACOSCOPY (VATS)/ RIGHT UPPER LOBECTOMY;  Surgeon: Loreli Slot, MD;  Location: [facility] OR;  Service: Thoracic;  Laterality: Right;   VIDEO BRONCHOSCOPY WITH ENDOBRONCHIAL NAVIGATION N/A 09/02/2018   Procedure: VIDEO BRONCHOSCOPY WITH ENDOBRONCHIAL NAVIGATION;  Surgeon: Josephine Igo, DO;  Location: [facility] OR;  Service: Thoracic;  Laterality: N/A;   VIDEO BRONCHOSCOPY WITH INSERTION OF INTERBRONCHIAL VALVE (IBV) N/A 07/10/2022   Procedure: VIDEO BRONCHOSCOPY WITH INSERTION OF INTERBRONCHIAL VALVE (IBV);  Surgeon: Loreli Slot, MD;  Location: [facility] OR;  Service: Thoracic;  Laterality: N/A;   VIDEO BRONCHOSCOPY WITH INSERTION OF INTERBRONCHIAL VALVE (IBV) N/A 09/06/2022   Procedure:  VIDEO BRONCHOSCOPY WITH REMOVAL OF INTRABRONCHIAL VALVE (IBV);  Surgeon: Loreli Slot, MD;  Location: [facility] OR;  Service: Thoracic;  Laterality: N/A;   Social History:  reports that he quit smoking about 4 years ago. His smoking use included cigarettes. He has a 9.50 pack-year smoking history. He has never used smokeless tobacco. He reports that he does not currently use alcohol. He reports that he does not use drugs.  Allergies  Allergen Reactions   Compazine [Prochlorperazine] Other (See Comments)    " made him high and could not sleep". He does not want to take it again.    Family  History  Problem Relation Age of Onset   Colon cancer Mother    Heart disease Father    Heart disease Paternal Grandfather    Rectal cancer Other    Esophageal cancer Neg Hx    Stomach cancer Neg Hx     Prior to Admission medications   Medication Sig Start Date End Date Taking? Authorizing Provider  amLODipine (NORVASC) 5 MG tablet TAKE 1 TABLET BY MOUTH DAILY 06/24/22   Runell Gess, MD  aspirin EC 81 MG tablet Take 81 mg by mouth daily.    [provider]  clopidogrel (PLAVIX) 75 MG tablet TAKE 1 TABLET BY MOUTH EVERY DAY 09/23/22   Runell Gess, MD  cyclobenzaprine (FLEXERIL) 10 MG tablet Take 1 tablet (10 mg total) by mouth 3 (three) times daily. 11/27/22   Nafziger, Kandee Keen, NP  diclofenac Sodium (VOLTAREN) 1 % GEL Apply 2 g topically 4 (four) times daily. 11/20/22   Nafziger, Kandee Keen, NP  doxylamine, Sleep, (UNISOM) 25 MG tablet Take 25 mg by mouth at bedtime as needed for sleep.    [provider]  esomeprazole (NEXIUM) 20 MG capsule Take 40 mg by mouth daily.    [provider]  fexofenadine (ALLEGRA) 180 MG tablet Take 180 mg by mouth daily.    [provider]  folic acid (FOLVITE) 1 MG tablet Take 1 tablet (1 mg total) by mouth daily. 06/26/22   Gold, Glenice Laine, PA-C  Magnesium 500 MG TABS Take 1 tablet (500 mg total) by mouth in the morning and at bedtime. 06/26/22   Rowe Clack, PA-C  methylPREDNISolone (MEDROL DOSEPAK) 4 MG TBPK tablet Take as directed 11/27/22   [name], NP  metoprolol succinate (TOPROL-XL) 25 MG 24 hr tablet TAKE 1 TABLET BY MOUTH DAILY 08/23/22   Runell Gess, MD  mirtazapine (REMERON) 15 MG tablet TAKE 1 TABLET(15 MG) BY MOUTH AT BEDTIME 06/26/22   Nafziger, Kandee Keen, NP  Multiple Vitamin (MULTIVITAMIN WITH MINERALS) TABS tablet Take 1 tablet by mouth daily.    [provider]  rosuvastatin (CRESTOR) 5 MG tablet TAKE 1 TABLET(5 MG) BY MOUTH EVERY OTHER DAY 09/23/22   Runell Gess, MD  TRELEGY ELLIPTA  100-62.5-25 MCG/ACT AEPB INHALE 1 PUFF INTO THE LUNGS DAILY 10/22/22   [name], NP    Physical Exam: Vitals:   12/03/22 0901 12/03/22 0902 12/03/22 1200 12/03/22 1255  BP: 119/83  (!) 138/94   Pulse: 96  89   Resp: (!) 21  (!) 21   Temp: 98.2 F (36.8 C)   98 F (36.7 C)  TempSrc: Oral   Oral  SpO2: 92%  98%   Weight:  40.8 kg    Height:  5\' 8"  (1.727 m)     General; Thin, in no distress.  CVS; S 1, S 2 RRR Lungs; Normal Respiratory Effort,  BL Air movement BL ronchus.  Abdomen; BS present soft, nt Extremities; no edema.   Data Reviewed:  See above, labs reviewed.   Assessment and Plan: No notes have been filed under this hospital service. Service: Hospitalist  1-Hematemesis; Coffee ground Emesis;  -Admit to step down unit.  -Cycle Hb./  -IV protonix Gtt -GI has been consulted.  -In setting of recent steroids use.  -Hold plavix , aspirin.   2-Acute Hypoxic Respiratory Failure, in setting PNA,  Unchanged Moderate Right, small left pleural effusions, likely chronic and loculated -S/P Right Upper Lobectomy -COPD -Stage IV Lung cancer, per patient his cancer has been stable. He is not on any Tx.  -Presents with cough< CT chest: Negative for PE, Ground glass opacity throughout the lung bases, particularly left base. Unchanged moderate right, small left pleural effusions, likely chronic and loculated, with evidence of prior left-sided talc pleurodesis. -Continue with IV ceftriaxone, and Azithromycin.  -PRN Robitussin.  -Continue with home Inhaler.   3-Hyponatremia; In setting hypovolemia, in setting Vomiting and diarrhea.  -Continue with IV fluids.   4-Hypokalemia; Replete IV.   5-CKD stage IIIA; -Cr Per records 1.6---1.4 -IV fluids.  -Monitor renal function.   6-Diarrhea;  Check for C diff, and GI pathogen.  GI following.  CT abdomen pelvis: no acute finding.   7-Chronic Back pain,  New endplate wedge deformity T 7,  New superior endplate deformity  of L4  He was suppose to complete steroids today. Plan to hold due to GI bleed.  IV Robaxin.  PRN dilaudid.   HTN;  Resume meds when he tolerates oral.  IV metoprolol RPN ordered.   CAD; holding plavix and aspirin in setting GI bleed.    Advance Care Planning:   Code Status: Full Code Patient wishes to be Full code in the event of reversible cause. He does not wants to be On life support for long period of time.   Consults: GI.   Family Communication: Care discussed with patient.   Severity of Illness: The appropriate patient status for this patient is OBSERVATION. Observation status is judged to be reasonable and necessary in order to provide the required intensity of service to ensure the patient's safety. The patient's presenting symptoms, physical exam findings, and initial radiographic and laboratory data in the context of their medical condition is felt to place them at decreased risk for further clinical deterioration. Furthermore, it is anticipated that the patient will be medically stable for discharge from the hospital within 2 midnights of admission.   Author: Alba Cory, MD 12/03/2022 3:53 PM  For on call review [email or web address].

## 2022-12-03 NOTE — ED Triage Notes (Signed)
Pt BIBA from home for NVD x2 days, worse the last 24 hrs, noted dark stool. Denies abdominal pain, has chronic back pain. Started steroid taper 6 days ago. Hx stage 4 lung cx. No chemo in 2 years. 20gLF, 4mg  zof, 250cc NS  122/68 92% RA baseline CBG 159

## 2022-12-04 DIAGNOSIS — J181 Lobar pneumonia, unspecified organism: Secondary | ICD-10-CM | POA: Diagnosis present

## 2022-12-04 DIAGNOSIS — E861 Hypovolemia: Secondary | ICD-10-CM | POA: Diagnosis present

## 2022-12-04 DIAGNOSIS — E222 Syndrome of inappropriate secretion of antidiuretic hormone: Secondary | ICD-10-CM | POA: Diagnosis present

## 2022-12-04 DIAGNOSIS — J440 Chronic obstructive pulmonary disease with acute lower respiratory infection: Secondary | ICD-10-CM | POA: Diagnosis present

## 2022-12-04 DIAGNOSIS — R112 Nausea with vomiting, unspecified: Secondary | ICD-10-CM | POA: Diagnosis not present

## 2022-12-04 DIAGNOSIS — D500 Iron deficiency anemia secondary to blood loss (chronic): Secondary | ICD-10-CM | POA: Diagnosis not present

## 2022-12-04 DIAGNOSIS — C799 Secondary malignant neoplasm of unspecified site: Secondary | ICD-10-CM | POA: Diagnosis present

## 2022-12-04 DIAGNOSIS — I129 Hypertensive chronic kidney disease with stage 1 through stage 4 chronic kidney disease, or unspecified chronic kidney disease: Secondary | ICD-10-CM | POA: Diagnosis present

## 2022-12-04 DIAGNOSIS — J439 Emphysema, unspecified: Secondary | ICD-10-CM | POA: Diagnosis present

## 2022-12-04 DIAGNOSIS — J90 Pleural effusion, not elsewhere classified: Secondary | ICD-10-CM | POA: Diagnosis present

## 2022-12-04 DIAGNOSIS — J449 Chronic obstructive pulmonary disease, unspecified: Secondary | ICD-10-CM | POA: Diagnosis not present

## 2022-12-04 DIAGNOSIS — R64 Cachexia: Secondary | ICD-10-CM | POA: Diagnosis present

## 2022-12-04 DIAGNOSIS — K920 Hematemesis: Secondary | ICD-10-CM

## 2022-12-04 DIAGNOSIS — E860 Dehydration: Secondary | ICD-10-CM | POA: Diagnosis present

## 2022-12-04 DIAGNOSIS — Z515 Encounter for palliative care: Secondary | ICD-10-CM | POA: Diagnosis not present

## 2022-12-04 DIAGNOSIS — J9601 Acute respiratory failure with hypoxia: Secondary | ICD-10-CM | POA: Diagnosis present

## 2022-12-04 DIAGNOSIS — R197 Diarrhea, unspecified: Secondary | ICD-10-CM | POA: Diagnosis not present

## 2022-12-04 DIAGNOSIS — J9691 Respiratory failure, unspecified with hypoxia: Secondary | ICD-10-CM | POA: Diagnosis not present

## 2022-12-04 DIAGNOSIS — E43 Unspecified severe protein-calorie malnutrition: Secondary | ICD-10-CM | POA: Diagnosis present

## 2022-12-04 DIAGNOSIS — N1832 Chronic kidney disease, stage 3b: Secondary | ICD-10-CM | POA: Diagnosis present

## 2022-12-04 DIAGNOSIS — Z66 Do not resuscitate: Secondary | ICD-10-CM | POA: Diagnosis not present

## 2022-12-04 DIAGNOSIS — K921 Melena: Secondary | ICD-10-CM | POA: Diagnosis not present

## 2022-12-04 DIAGNOSIS — J189 Pneumonia, unspecified organism: Secondary | ICD-10-CM | POA: Diagnosis not present

## 2022-12-04 DIAGNOSIS — N183 Chronic kidney disease, stage 3 unspecified: Secondary | ICD-10-CM | POA: Diagnosis not present

## 2022-12-04 DIAGNOSIS — Z681 Body mass index (BMI) 19 or less, adult: Secondary | ICD-10-CM | POA: Diagnosis not present

## 2022-12-04 DIAGNOSIS — I739 Peripheral vascular disease, unspecified: Secondary | ICD-10-CM | POA: Diagnosis present

## 2022-12-04 DIAGNOSIS — E785 Hyperlipidemia, unspecified: Secondary | ICD-10-CM | POA: Diagnosis present

## 2022-12-04 DIAGNOSIS — D509 Iron deficiency anemia, unspecified: Secondary | ICD-10-CM | POA: Diagnosis present

## 2022-12-04 DIAGNOSIS — C3491 Malignant neoplasm of unspecified part of right bronchus or lung: Secondary | ICD-10-CM | POA: Diagnosis present

## 2022-12-04 DIAGNOSIS — N179 Acute kidney failure, unspecified: Secondary | ICD-10-CM | POA: Diagnosis present

## 2022-12-04 DIAGNOSIS — C7951 Secondary malignant neoplasm of bone: Secondary | ICD-10-CM | POA: Diagnosis present

## 2022-12-04 DIAGNOSIS — Z7189 Other specified counseling: Secondary | ICD-10-CM | POA: Diagnosis not present

## 2022-12-04 LAB — CBC
HCT: 22.6 % — ABNORMAL LOW (ref 39.0–52.0)
HCT: 25.5 % — ABNORMAL LOW (ref 39.0–52.0)
Hemoglobin: 7.6 g/dL — ABNORMAL LOW (ref 13.0–17.0)
Hemoglobin: 8.1 g/dL — ABNORMAL LOW (ref 13.0–17.0)
MCH: 31 pg (ref 26.0–34.0)
MCH: 30 pg (ref 26.0–34.0)
MCHC: 33.6 g/dL (ref 30.0–36.0)
MCHC: 31.8 g/dL (ref 30.0–36.0)
MCV: 92.2 fL (ref 80.0–100.0)
MCV: 94.4 fL (ref 80.0–100.0)
Platelets: 350 10*3/uL (ref 150–400)
Platelets: 373 10*3/uL (ref 150–400)
RBC: 2.45 MIL/uL — ABNORMAL LOW (ref 4.22–5.81)
RBC: 2.7 MIL/uL — ABNORMAL LOW (ref 4.22–5.81)
RDW: 16.5 % — ABNORMAL HIGH (ref 11.5–15.5)
RDW: 16.7 % — ABNORMAL HIGH (ref 11.5–15.5)
WBC: 11.9 10*3/uL — ABNORMAL HIGH (ref 4.0–10.5)
WBC: 11.1 10*3/uL — ABNORMAL HIGH (ref 4.0–10.5)
nRBC: 0 % (ref 0.0–0.2)
nRBC: 0 % (ref 0.0–0.2)

## 2022-12-04 LAB — COMPREHENSIVE METABOLIC PANEL
ALT: 12 U/L (ref 0–44)
AST: 14 U/L — ABNORMAL LOW (ref 15–41)
Albumin: 2.5 g/dL — ABNORMAL LOW (ref 3.5–5.0)
Alkaline Phosphatase: 46 U/L (ref 38–126)
Anion gap: 8 (ref 5–15)
BUN: 43 mg/dL — ABNORMAL HIGH (ref 8–23)
CO2: 24 mmol/L (ref 22–32)
Calcium: 8.4 mg/dL — ABNORMAL LOW (ref 8.9–10.3)
Chloride: 97 mmol/L — ABNORMAL LOW (ref 98–111)
Creatinine, Ser: 1.56 mg/dL — ABNORMAL HIGH (ref 0.61–1.24)
GFR, Estimated: 49 mL/min — ABNORMAL LOW (ref >60)
Glucose, Bld: 132 mg/dL — ABNORMAL HIGH (ref 70–99)
Potassium: 3.3 mmol/L — ABNORMAL LOW (ref 3.5–5.1)
Sodium: 129 mmol/L — ABNORMAL LOW (ref 135–145)
Total Bilirubin: 0.2 mg/dL — ABNORMAL LOW (ref 0.3–1.2)
Total Protein: 5.3 g/dL — ABNORMAL LOW (ref 6.5–8.1)

## 2022-12-04 LAB — HEMOGLOBIN AND HEMATOCRIT, BLOOD
HCT: 26.1 % — ABNORMAL LOW (ref 39.0–52.0)
Hemoglobin: 8.4 g/dL — ABNORMAL LOW (ref 13.0–17.0)

## 2022-12-04 LAB — PROTIME-INR
INR: 1.1 (ref 0.8–1.2)
Prothrombin Time: 14.7 seconds (ref 11.4–15.2)

## 2022-12-04 MED ORDER — PANTOPRAZOLE SODIUM 40 MG IV SOLR
40.0000 mg | Freq: Two times a day (BID) | INTRAVENOUS | Status: DC
  Administered 2022-12-04 – 2022-12-05 (×2): 40 mg via INTRAVENOUS
  Filled 2022-12-04 (×2): qty 10

## 2022-12-04 MED ORDER — CHLORHEXIDINE GLUCONATE CLOTH 2 % EX PADS
6.0000 | MEDICATED_PAD | Freq: Every day | CUTANEOUS | Status: DC
  Administered 2022-12-05 – 2022-12-06 (×2): 6 via TOPICAL

## 2022-12-04 MED ORDER — KATE FARMS STANDARD 1.4 PO LIQD
325.0000 mL | Freq: Every day | ORAL | Status: DC
  Administered 2022-12-04 – 2022-12-06 (×3): 325 mL via ORAL
  Filled 2022-12-04 (×3): qty 325

## 2022-12-04 MED ORDER — ADULT MULTIVITAMIN W/MINERALS CH: 1.0000 | ORAL_TABLET | Freq: Every day | ORAL | Status: DC

## 2022-12-04 NOTE — Progress Notes (Signed)
Initial Nutrition Assessment  DOCUMENTATION CODES:   Severe malnutrition in context of chronic illness  INTERVENTION:  - Full Liquid diet per MD. - Recommend appetite stimulant if medically appropriate. - Magic cup BID with meals, each supplement provides 290 kcal and 9 grams of protein -[employer] 1.4 PO once daily, provides 455 kcal and 20 grams protein. - Multivitamin with minerals daily to support micronutrient needs. - Monitor weight trends closely.   - If patient eats very poorly and/or continues to lose weight, would recommend consideration of feeding tube for supplemental enteral nutrition.    NUTRITION DIAGNOSIS:   Severe Malnutrition related to chronic illness as evidenced by severe fat depletion, severe muscle depletion, percent weight loss (10% in 3 months).  GOAL:   Patient will meet greater than or equal to 90% of their needs  MONITOR:   PO intake, Supplement acceptance, Diet advancement, Weight trends, Labs  REASON FOR ASSESSMENT:   Malnutrition Screening Tool    ASSESSMENT:   67 y.o. male with PMH HTN. H:D, CAD, PAD, stage IV metastatic non-small cell lung cancer treated with [place]ia and Keytruda (last treatment 2022), COPD stage IV, iron deficiency anemia who presented complaining of nausea, vomiting, coffee-ground emesis, cough, diarrhea.  Met with patient and wife at bedside this morning. Patient reports a  recent (within the past year) UBW of 88-95#. Shares he lost a lot of weight when diagnosed with cancer and on treatment but weight more steady over the past year. Per EMR, patient weighed at 98# in February and now weighed at 88# - a 10# or 10% weight loss in 3 months, which is significant for the time frame.   He endorses eating around 1 meal a day at home. Usually has a 2 egg omelet with bacon, cheese, and a lot of butter along with grits with a lot of butter. Notes he doesn't eat much more than this besides an occasional ice cream or oatmeal cookie  because he sleeps most of the day. Wife reports that although patient has never really eaten 3 meals a day, he used to eat bigger portions and 2 meals. Appetite has just been very poor for several months.   Patient takes a multivitamin, magnesium, and a sodium tablet at home. Has tried nutrition supplements but reports they often give him a lot of reflux and GI issues.  Patient NPO during visit but advanced to full liquids later this morning. He has not tried [employer] or [employer] yet so agreeable to try these during admission.  Patient meets criteria for severe malnutrition given severe muscle and fat wasting and significant weight loss with BMI of 13.85.  Patient may benefit from a feeding tube if within his goals of care. At this time, he is agreeable to try an appetite stimulant if possible. Notes he is on Remeron but it doesn't seem to affect his appetite.  Reached out to MD to ask if patient would be able to receive appetite stimulant.   Medications reviewed and include: D5 @ 113mL/hr (provides 408 kcals over 24 hours), Zofran prn  Labs reviewed:  Na 129 K+ 3.3 Creatinine 1.56   NUTRITION - FOCUSED PHYSICAL EXAM:  Flowsheet Row Most Recent Value  Orbital Region Severe depletion  Upper Arm Region Severe depletion  Thoracic and Lumbar Region Severe depletion  Buccal Region Severe depletion  Temple Region Severe depletion  Clavicle Bone Region Severe depletion  Clavicle and Acromion Bone Region Severe depletion  Scapular Bone Region Unable to assess  Dorsal Hand Severe depletion  Patellar Region Severe depletion  Anterior Thigh Region Severe depletion  Posterior Calf Region Severe depletion  Edema (RD Assessment) None  Hair Reviewed  Eyes Reviewed  Mouth Reviewed  [patient reports his teeth break often, some difficult chewing tough foods]  Skin Reviewed  Nails Reviewed       Diet Order:   Diet Order             Diet full liquid Room service appropriate? Yes;  Fluid consistency: Thin  Diet effective now                   EDUCATION NEEDS:  Education needs have been addressed  Skin:  Skin Assessment: Reviewed RN Assessment  Last BM:  5/7  Height:  Ht Readings from Last 1 Encounters:  12/03/22 5\' 7"  (1.702 m)   Weight:  Wt Readings from Last 1 Encounters:  12/03/22 40.1 kg   Ideal Body Weight:     BMI:  Body mass index is 13.85 kg/m.  Estimated Nutritional Needs:  Kcal:  1800-2000 kcals Protein:  70-80 grams Fluid:  >/= 1.8L    .Shelle Iron RD, LDN For contact information, refer to [facility].

## 2022-12-04 NOTE — Progress Notes (Signed)
Labs resulted and appear diluted. NP reviewed and agreed. Labs ordered for re draw

## 2022-12-04 NOTE — Progress Notes (Addendum)
Daily Progress Note  DOA: 12/03/2022 Hospital Day: 2 Chief Complaint: Dark stool, diarrhea   Assessment and Plan:    Brief Narrative:  Sean Rose is a 67 y.o. year old male with multiple medical problems not limited to HTN,  CAD s/p remote stenting, PAD on Plavix, stage IV non-small cell lung cancer, severe COPD, CKD, GERD, hiatal hernia, candida esophagitis,  and IDA. We saw him in consult 5/7 for dark stool / diarrhea.Refer to that note for details.   Acute dark emesis and dark (heme negative) diarrhea of unclear etiology. In setting of prednisone and daily asa could have erosive disease / PUD but again, stools heme negative. Unrevealing CT AP except for a fluid filled gastric fundus and  thickening of the gastric body and antrum which are chronic findings  -Reviewed some of his prior CT scans and the gastric wall thickening dates back to a CT scan in March 2023. EGD 5 months prior to that was without any major gastric findings   -No further N/V/D. Path panel and C-diff studies ordered but hasn't had any further diarrhea so will discontinue studies. RN will let me know if he has recurrent diarrhea.  -Continue anti-emetics as needed -Will stop PPI infusion and change to BID IV PPI.  -Trial of full liquids. With resolving symptoms and stable hgb overnight  it is unlikely that EGD will be pursued.   Acute hypoxic respiratory failure / PNA / moderate R pleural effusion / severe COPD On Zithromax and Rocephin  Chronic IDA, followed by Hematology.  Hgb is down to 8.1 from baseline of 9.7 but probably dilutional to some degree  CAD with history of remote stents / PAD. On Plavix and Asa. -plavix on hold. Last dose weas 5/5  Metastatic non-cell lung cancer s/p RUL lobectomy with lymph node dissection, adjuvant chemotherapy and palliative radiotherapy to metastatic bone lesion.  Currently on observation.    Attending Physician Note   I have taken an interval history, reviewed the  chart and examined the patient. I performed a substantive portion of this encounter, including complete performance of at least one of the key components, in conjunction with the APP. I agree with the APP's note, impression and recommendations with my edits. My additional impressions and recommendations are as follows.   Severe diarrhea, nausea, vomiting with dark emesis and heme negative stool. No clear evidence for significant GI bleeding. CT AP unremarkable. Suspect infectious process.  Symptoms are improving. Stool for enteric pathogens, C diff. Started on Rocephin and Zithromax by primary service. Continue IV pantoprazole and ondansetron. No plans for EGD at this time. If clear evidence of UGI bleeding occurs consider EGD however he is very high risk for anesthesia, procedures.    Chronic IDA receiving IV iron infusions. Hgb above his baseline, anticipate drop with IV hydration.    COPD   Metastatic non small cell lung cancer   CAD with stent and PVD with right iliac stent. Hold Plavix and ASA   CKD3  Claudette Head, MD [facility] See Loretha Stapler, [facility], for our on call provider     Subjective / New Events:   No nausea / vomiting or diarrhea today. Feels okay  Objective:  EGD 05/22/2021: - Esophageal plaques were found, consistent with candidiasis. Tx w/ fluconazole x 2 weeks - 3 cm hiatal hernia.  - Prominent rugae mucosa in the gastric fundus and gastric body. Biopsied.  - The examination was otherwise normal.  - Biopsies were taken with  a cold forceps for histology in the gastric antrum.  - Biopsies were taken with a cold forceps for histology in the entire duodenum. ? if he had a transient gastritis/gastroenteritis vs Check-point inhibitor issues   Colonoscopy 01/28/2013: Internal hemorrhoids External hemorrhoids Normal colonoscopy Excellent prep Repeat colonoscopy in 10 years  Recent Labs    12/03/22 0928 12/03/22 1846 12/04/22 0054 12/04/22 0746  WBC 16.2*  --  11.9*  11.1*  HGB 11.6* 10.4* 7.6* 8.1*  HCT 34.5* 31.3* 22.6* 25.5*  PLT 529*  --  350 373   BMET Recent Labs    12/03/22 0928 12/04/22 0054  NA 128* 129*  K 3.1* 3.3*  CL 85* 97*  CO2 30 24  GLUCOSE 132* 132*  BUN 50* 43*  CREATININE 1.75* 1.56*  CALCIUM 9.6 8.4*   LFT Recent Labs    12/04/22 0054  PROT 5.3*  ALBUMIN 2.5*  AST 14*  ALT 12  ALKPHOS 46  BILITOT 0.2*   PT/INR Recent Labs    12/04/22 0054  LABPROT 14.7  INR 1.1     Imaging:  CT ABDOMEN PELVIS W CONTRAST CLINICAL DATA:  Bowel obstruction suspected, persistent vomiting and hematemesis metastatic lung cancer * Tracking Code: BO *  EXAM: CT ABDOMEN AND PELVIS WITH CONTRAST  TECHNIQUE: Multidetector CT imaging of the abdomen and pelvis was performed using the standard protocol following bolus administration of intravenous contrast.  RADIATION DOSE REDUCTION: This exam was performed according to the departmental dose-optimization program which includes automated exposure control, adjustment of the mA and/or kV according to patient size and/or use of iterative reconstruction technique.  CONTRAST:  80mL OMNIPAQUE IOHEXOL 350 MG/ML SOLN  COMPARISON:  CT chest abdomen pelvis, 09/30/2022  FINDINGS: Lower chest: Please see separately reported examination of the chest.  Hepatobiliary: No solid liver abnormality is seen. No gallstones, gallbladder wall thickening, or biliary dilatation.  Pancreas: Unremarkable. No pancreatic ductal dilatation or surrounding inflammatory changes.  Spleen: Normal in size without significant abnormality.  Adrenals/Urinary Tract: Adrenal glands are unremarkable. Kidneys are normal, without renal calculi, solid lesion, or hydronephrosis. Bladder is unremarkable.  Stomach/Bowel: The gastric fundus is fluid-filled, thickening of the gastric body and antrum appearance similar to prior examination (series 19, image 20). Appendix is not clearly visualized and may be  surgically absent. No evidence of bowel wall thickening, distention, or inflammatory changes.  Vascular/Lymphatic: Severe aortic atherosclerosis. Bilateral common iliac artery stents. No enlarged abdominal or pelvic lymph nodes.  Reproductive: No mass or other significant abnormality.  Other: No abdominal wall hernia or abnormality. No ascites.  Musculoskeletal: New superior endplate deformity of L4 (series 26, image 71).  IMPRESSION: 1. No acute CT findings of the abdomen or pelvis to explain vomiting or hematemesis. No evidence of bowel obstruction. 2. The gastric fundus is fluid-filled, thickening of the gastric body and antrum appearance similar to prior examination. Consider endoscopic evaluation. 3. New superior endplate deformity of L4 correlate poor acute pain and point tenderness.  Aortic Atherosclerosis (ICD10-I70.0).  Electronically Signed   By: Jearld Lesch M.D.   On: 12/03/2022 13:46 CT Angio Chest PE W and/or Wo Contrast CLINICAL DATA:  PE suspected, metastatic lung cancer * Tracking Code: BO *  EXAM: CT ANGIOGRAPHY CHEST WITH CONTRAST  TECHNIQUE: Multidetector CT imaging of the chest was performed using the standard protocol during bolus administration of intravenous contrast. Multiplanar CT image reconstructions and MIPs were obtained to evaluate the vascular anatomy.  RADIATION DOSE REDUCTION: This exam was performed according to the  departmental dose-optimization program which includes automated exposure control, adjustment of the mA and/or kV according to patient size and/or use of iterative reconstruction technique.  CONTRAST:  80mL OMNIPAQUE IOHEXOL 350 MG/ML SOLN  COMPARISON:  09/30/2022  FINDINGS: Cardiovascular: Satisfactory opacification of the pulmonary arteries to the segmental level. No evidence of pulmonary embolism. Normal heart size. Left and right coronary artery calcifications. No pericardial effusion. Aortic  atherosclerosis.  Mediastinum/Nodes: Unchanged subcarinal lymph node, poorly visualized on this early arterial phase examination (series 9, image 70). Fluid-filled esophagus. Thyroid gland and trachea demonstrate no significant findings.  Lungs/Pleura: Status post right upper lobectomy. Severe underlying emphysema. Unchanged moderate right, small left pleural effusions, likely chronic and loculated, with evidence of prior left-sided talc pleurodesis. New heterogeneous and ground-glass airspace opacity throughout the lung bases, particularly the left lung base (series 17, image 103).  Upper Abdomen: No acute abnormality.  Musculoskeletal: No chest wall abnormality. New inferior endplate wedge deformity of T7 (series 13, image 83). Unchanged mild superior endplate deformity of T11 (series 13, image 83).  Review of the MIP images confirms the above findings.  IMPRESSION: 1. Negative examination for pulmonary embolism. 2. New heterogeneous and ground-glass airspace opacity throughout the lung bases, particularly the left lung base, consistent with infection or aspiration. 3. Unchanged moderate right, small left pleural effusions, likely chronic and loculated, with evidence of prior left-sided talc pleurodesis. 4. Status post right upper lobectomy. 5. Severe underlying emphysema. 6. Coronary artery disease. 7. New inferior endplate wedge deformity of T7. Correlate for acutely referable pain and point tenderness.  Aortic Atherosclerosis (ICD10-I70.0) and Emphysema (ICD10-J43.9).  Electronically Signed   By: Jearld Lesch M.D.   On: 12/03/2022 13:38 DG Chest Portable 1 View CLINICAL DATA:  Provided history: Shortness of breath. History of stage IV lung cancer.  EXAM: PORTABLE CHEST 1 VIEW  COMPARISON:  Chest CT 09/30/2022. Prior chest radiographs 09/17/2022 and earlier.  FINDINGS: Right chest infusion port catheter with tip at the level of the superior cavoatrial  junction. The cardiomediastinal silhouette is unchanged. Aortic atherosclerosis. Small right pleural effusion. Small to moderate left pleural effusion with fluid at the left base and left apex. Underlying emphysema. Ill-defined opacities at the left lung base, slightly progressed from the prior examination of 09/17/2022. Levocurvature of the thoracic spine. Redemonstrated sclerotic changes within the left scapula.  IMPRESSION: 1. Small-to-moderate left pleural effusion (with fluid at both the left base and apex), mildly increased from the prior chest radiographs of 09/17/2022. 2. Small right pleural effusion, similar to the prior exam. 3. Ill-defined opacities at the left lung base mildly increased from the prior examination. This at least partly reflects atelectasis and scarring. However, superimposed pneumonia is difficult to exclude. Correlate clinically and consider short-interval radiographic follow-up. 4. Aortic Atherosclerosis (ICD10-I70.0) and Emphysema (ICD10-J43.9).  Electronically Signed   By: Jackey Loge D.O.   On: 12/03/2022 10:56     Scheduled inpatient medications:   Chlorhexidine Gluconate Cloth  6 each Topical Daily   fluticasone furoate-vilanterol  1 puff Inhalation Daily   And   umeclidinium bromide  1 puff Inhalation Daily   [START ON 12/06/2022] pantoprazole  40 mg Intravenous Q12H   sodium chloride flush  3 mL Intravenous Q12H   Continuous inpatient infusions:   sodium chloride     azithromycin     cefTRIAXone (ROCEPHIN)  IV     dextrose 5 % and 0.9% NaCl 100 mL/hr at 12/04/22 1000   methocarbamol (ROBAXIN) IV     pantoprazole 8 mg/hr (  12/04/22 1000)   PRN inpatient medications: sodium chloride, acetaminophen **OR** acetaminophen, doxylamine (Sleep), guaiFENesin-dextromethorphan, HYDROmorphone (DILAUDID) injection, LORazepam, methocarbamol (ROBAXIN) IV, metoprolol tartrate, ondansetron **OR** ondansetron (ZOFRAN) IV, sodium chloride flush  Vital signs  in last 24 hours: Temp:  [97.6 F (36.4 C)-98 F (36.7 C)] 97.6 F (36.4 C) (05/08 0817) Pulse Rate:  [67-104] 85 (05/08 1000) Resp:  [14-35] 35 (05/08 1000) BP: (79-138)/(51-105) 130/95 (05/08 1000) SpO2:  [93 %-100 %] 93 % (05/08 1000) Weight:  [40.1 kg] 40.1 kg (05/07 1730) Last BM Date : 12/03/22  Intake/Output Summary (Last 24 hours) at 12/04/2022 1011 Last data filed at 12/04/2022 1000 Gross per 24 hour  Intake 2858.99 ml  Output 850 ml  Net 2008.99 ml    Intake/Output from previous day: 05/07 0701 - 05/08 0700 In: 2529.1 [I.V.:1529.1; IV Piggyback:1000] Out: 650 [Urine:650] Intake/Output this shift: Total I/O In: 329.9 [I.V.:329.9] Out: 200 [Urine:200]   Physical Exam:  General: Alert thin male in NAD Heart:  Regular rate and rhythm.  Pulmonary: Normal respiratory effort Abdomen: Soft, nondistended, nontender. Normal bowel sounds. Extremities: No lower extremity edema  Neurologic: Alert and oriented Psych: Pleasant. Cooperative. Insight appears normal.    Principal Problem:   Hematemesis Active Problems:   Hypokalemia   Nausea vomiting and diarrhea   Hyponatremia   Adenocarcinoma of right lung, stage 4 (HCC)   CKD (chronic kidney disease) stage 3, GFR 30-59 ml/min (HCC)   Lobar pneumonia (HCC)     LOS: 0 days   Willette Cluster ,NP 12/04/2022, 10:11 AM

## 2022-12-04 NOTE — Progress Notes (Signed)
[facility] Progress Note  Patient: Sean Rose     WUJ:[id number]  DOA: 12/03/2022   PCP: [name], NP       Brief hospital course: This is a 67 year old male with metastatic lung cancer, currently not on treatment, COPD stage IV, CAD with stenting, PAD with iliac stent, iron deficiency anemia who presents to the hospital for vomiting, diarrhea and dark stools. Upon further discussion, he admits to having a very sensitive gag reflex and over the past 2 days he has been gagging and vomiting.  He also admits to a cough with thick sputum over the past few days.  He believes he has had melena.  In the ED:  Pulse ox 86% on room air-tachycardic with heart rates in the 90s and low 100s, respiratory rate in the 20s Fecal occult blood negative Sodium 128, WBC count 16, hemoglobin 11 CTA revealed new heterogeneous and groundglass opacities throughout the lung bases particularly left, moderate right and small left pleural effusions likely chronic and loculated, right upper lobe lobectomy, severe underlying emphysema and new wedge-shaped deformity of T7  CT abdomen pelvis revealed that the gastric fundus was filled with fluid, there was thickening of the gastric body and antrum similar to prior exam.  The patient was diagnosed with pneumonia and started on antibiotics. At IV Protonix infusion was started, aspirin Plavix was held and a GI consult was requested.  Subjective:  He is coughing less today.  He does not feel that his gag reflex is severe and is willing to try a full liquid diet which has been ordered. He does admit to severe weakness.  No complaints of diarrhea.  Assessment and Plan: Principal Problem:   Hematemesis/melena? Currently has no complaints of either and stool Hemoccult was negative - Appreciate GI evaluation-he has been started on full liquid diet and Protonix has been changed to twice daily - Follow hemoglobin today  Active Problems:  Pneumonia with  acute hypoxic respiratory failure and tachycardia - WBC co continue ceftriaxone and azithromycin - WBC count was 16.2 and is improving - Wean oxygen as able    Hyponatremia -Likely dehydration - Continue IV fluids  Severe malnutrition Body mass index is 13.85 kg/m. -He states that he is unable to tolerate most dietary supplements - Continue dietary supplements as tolerated while in the hospital    Adenocarcinoma of right lung, stage 4 -Status post right upper lobectomy with lymph node dissection - Palliative radiotherapy to metastasis in the scapular and left supraclavicular area - Unable to tolerate chemotherapy after 12 cycles - Severe weight loss and fatigue as noted    Hypokalemia -Continue to replace -Magnesium was 2.5    CKD (chronic kidney disease) stage 3a, GFR 30-59 ml/min (HCC) -Follow          Code Status: Full Code Consultants: None Level of Care: Level of care: Stepdown Total time on patient care: 45 minutes DVT prophylaxis:  SCDs Start: 12/03/22 1611     Objective:   Vitals:   12/04/22 0817 12/04/22 0900 12/04/22 1000 12/04/22 1100  BP:  107/66 (!) 130/95 (!) 140/74  Pulse:  75 85 84  Resp:  14 (!) 35 (!) 21  Temp: 97.6 F (36.4 C)     TempSrc: Oral     SpO2: 94% 93% 93% 92%  Weight:      Height:       Filed Weights   12/03/22 0902 12/03/22 1730  Weight: 40.8 kg 40.1 kg   Exam: General exam:  Appears comfortable-cachectic HEENT: oral mucosa moist Respiratory system: Coarse breath sounds in the bases, mild cough Cardiovascular system: S1 & S2 heard  Gastrointestinal system: Abdomen soft, non-tender, nondistended. Normal bowel sounds   Extremities: No cyanosis, clubbing or edema Psychiatry:  Mood & affect appropriate.      CBC: Recent Labs  Lab 12/03/22 0928 12/03/22 1846 12/04/22 0054 12/04/22 0746  WBC 16.2*  --  11.9* 11.1*  NEUTROABS 14.3*  --   --   --   HGB 11.6* 10.4* 7.6* 8.1*  HCT 34.5* 31.3* 22.6* 25.5*  MCV 91.8  --   92.2 94.4  PLT 529*  --  350 373   Basic Metabolic Panel: Recent Labs  Lab 12/03/22 0928 12/04/22 0054  NA 128* 129*  K 3.1* 3.3*  CL 85* 97*  CO2 30 24  GLUCOSE 132* 132*  BUN 50* 43*  CREATININE 1.75* 1.56*  CALCIUM 9.6 8.4*  MG 2.5*  --    GFR: Estimated Creatinine Clearance: 26.4 mL/min (A) (by C-G formula based on SCr of 1.56 mg/dL (H)).  Scheduled Meds:  Chlorhexidine Gluconate Cloth  6 each Topical Daily   feeding supplement (KATE FARMS STANDARD 1.4)  325 mL Oral Daily   fluticasone furoate-vilanterol  1 puff Inhalation Daily   And   umeclidinium bromide  1 puff Inhalation Daily   [START ON 12/05/2022] multivitamin with minerals  1 tablet Oral Daily   pantoprazole (PROTONIX) IV  40 mg Intravenous Q12H   sodium chloride flush  3 mL Intravenous Q12H   Continuous Infusions:  sodium chloride     azithromycin     cefTRIAXone (ROCEPHIN)  IV     dextrose 5 % and 0.9% NaCl 100 mL/hr at 12/04/22 1100   methocarbamol (ROBAXIN) IV     Imaging and lab data was personally reviewed CT ABDOMEN PELVIS W CONTRAST  Result Date: 12/03/2022 CLINICAL DATA:  Bowel obstruction suspected, persistent vomiting and hematemesis metastatic lung cancer * Tracking Code: BO * EXAM: CT ABDOMEN AND PELVIS WITH CONTRAST TECHNIQUE: Multidetector CT imaging of the abdomen and pelvis was performed using the standard protocol following bolus administration of intravenous contrast. RADIATION DOSE REDUCTION: This exam was performed according to the departmental dose-optimization program which includes automated exposure control, adjustment of the mA and/or kV according to patient size and/or use of iterative reconstruction technique. CONTRAST:  80mL OMNIPAQUE IOHEXOL 350 MG/ML SOLN COMPARISON:  CT chest abdomen pelvis, 09/30/2022 FINDINGS: Lower chest: Please see separately reported examination of the chest. Hepatobiliary: No solid liver abnormality is seen. No gallstones, gallbladder wall thickening, or  biliary dilatation. Pancreas: Unremarkable. No pancreatic ductal dilatation or surrounding inflammatory changes. Spleen: Normal in size without significant abnormality. Adrenals/Urinary Tract: Adrenal glands are unremarkable. Kidneys are normal, without renal calculi, solid lesion, or hydronephrosis. Bladder is unremarkable. Stomach/Bowel: The gastric fundus is fluid-filled, thickening of the gastric body and antrum appearance similar to prior examination (series 19, image 20). Appendix is not clearly visualized and may be surgically absent. No evidence of bowel wall thickening, distention, or inflammatory changes. Vascular/Lymphatic: Severe aortic atherosclerosis. Bilateral common iliac artery stents. No enlarged abdominal or pelvic lymph nodes. Reproductive: No mass or other significant abnormality. Other: No abdominal wall hernia or abnormality. No ascites. Musculoskeletal: New superior endplate deformity of L4 (series 26, image 71). IMPRESSION: 1. No acute CT findings of the abdomen or pelvis to explain vomiting or hematemesis. No evidence of bowel obstruction. 2. The gastric fundus is fluid-filled, thickening of the gastric body and antrum appearance  similar to prior examination. Consider endoscopic evaluation. 3. New superior endplate deformity of L4 correlate poor acute pain and point tenderness. Aortic Atherosclerosis (ICD10-I70.0). Electronically Signed   By: Jearld Lesch M.D.   On: 12/03/2022 13:46   CT Angio Chest PE W and/or Wo Contrast  Result Date: 12/03/2022 CLINICAL DATA:  PE suspected, metastatic lung cancer * Tracking Code: BO * EXAM: CT ANGIOGRAPHY CHEST WITH CONTRAST TECHNIQUE: Multidetector CT imaging of the chest was performed using the standard protocol during bolus administration of intravenous contrast. Multiplanar CT image reconstructions and MIPs were obtained to evaluate the vascular anatomy. RADIATION DOSE REDUCTION: This exam was performed according to the departmental  dose-optimization program which includes automated exposure control, adjustment of the mA and/or kV according to patient size and/or use of iterative reconstruction technique. CONTRAST:  80mL OMNIPAQUE IOHEXOL 350 MG/ML SOLN COMPARISON:  09/30/2022 FINDINGS: Cardiovascular: Satisfactory opacification of the pulmonary arteries to the segmental level. No evidence of pulmonary embolism. Normal heart size. Left and right coronary artery calcifications. No pericardial effusion. Aortic atherosclerosis. Mediastinum/Nodes: Unchanged subcarinal lymph node, poorly visualized on this early arterial phase examination (series 9, image 70). Fluid-filled esophagus. Thyroid gland and trachea demonstrate no significant findings. Lungs/Pleura: Status post right upper lobectomy. Severe underlying emphysema. Unchanged moderate right, small left pleural effusions, likely chronic and loculated, with evidence of prior left-sided talc pleurodesis. New heterogeneous and ground-glass airspace opacity throughout the lung bases, particularly the left lung base (series 17, image 103). Upper Abdomen: No acute abnormality. Musculoskeletal: No chest wall abnormality. New inferior endplate wedge deformity of T7 (series 13, image 83). Unchanged mild superior endplate deformity of T11 (series 13, image 83). Review of the MIP images confirms the above findings. IMPRESSION: 1. Negative examination for pulmonary embolism. 2. New heterogeneous and ground-glass airspace opacity throughout the lung bases, particularly the left lung base, consistent with infection or aspiration. 3. Unchanged moderate right, small left pleural effusions, likely chronic and loculated, with evidence of prior left-sided talc pleurodesis. 4. Status post right upper lobectomy. 5. Severe underlying emphysema. 6. Coronary artery disease. 7. New inferior endplate wedge deformity of T7. Correlate for acutely referable pain and point tenderness. Aortic Atherosclerosis (ICD10-I70.0)  and Emphysema (ICD10-J43.9). Electronically Signed   By: Jearld Lesch M.D.   On: 12/03/2022 13:38   DG Chest Portable 1 View  Result Date: 12/03/2022 CLINICAL DATA:  Provided history: Shortness of breath. History of stage IV lung cancer. EXAM: PORTABLE CHEST 1 VIEW COMPARISON:  Chest CT 09/30/2022. Prior chest radiographs 09/17/2022 and earlier. FINDINGS: Right chest infusion port catheter with tip at the level of the superior cavoatrial junction. The cardiomediastinal silhouette is unchanged. Aortic atherosclerosis. Small right pleural effusion. Small to moderate left pleural effusion with fluid at the left base and left apex. Underlying emphysema. Ill-defined opacities at the left lung base, slightly progressed from the prior examination of 09/17/2022. Levocurvature of the thoracic spine. Redemonstrated sclerotic changes within the left scapula. IMPRESSION: 1. Small-to-moderate left pleural effusion (with fluid at both the left base and apex), mildly increased from the prior chest radiographs of 09/17/2022. 2. Small right pleural effusion, similar to the prior exam. 3. Ill-defined opacities at the left lung base mildly increased from the prior examination. This at least partly reflects atelectasis and scarring. However, superimposed pneumonia is difficult to exclude. Correlate clinically and consider short-interval radiographic follow-up. 4. Aortic Atherosclerosis (ICD10-I70.0) and Emphysema (ICD10-J43.9). Electronically Signed   By: Jackey Loge D.O.   On: 12/03/2022 10:56    LOS: 0  days   Author: Calvert Cantor  12/04/2022 12:13 PM  To contact [facility]>   Check the care team in [facility] and look for the attending/consulting [facility] provider listed  Log into [email or web address] and use [facility]'s universal password   Go to> "[facility]"  and find provider  If you still have difficulty reaching the provider, please page the [facility] (Director on Call) for the Hospitalists listed on amion

## 2022-12-05 DIAGNOSIS — N183 Chronic kidney disease, stage 3 unspecified: Secondary | ICD-10-CM | POA: Diagnosis not present

## 2022-12-05 DIAGNOSIS — R197 Diarrhea, unspecified: Secondary | ICD-10-CM | POA: Diagnosis not present

## 2022-12-05 DIAGNOSIS — C3491 Malignant neoplasm of unspecified part of right bronchus or lung: Secondary | ICD-10-CM | POA: Diagnosis not present

## 2022-12-05 DIAGNOSIS — J449 Chronic obstructive pulmonary disease, unspecified: Secondary | ICD-10-CM

## 2022-12-05 DIAGNOSIS — K921 Melena: Secondary | ICD-10-CM | POA: Diagnosis not present

## 2022-12-05 DIAGNOSIS — D500 Iron deficiency anemia secondary to blood loss (chronic): Secondary | ICD-10-CM | POA: Diagnosis not present

## 2022-12-05 DIAGNOSIS — J9691 Respiratory failure, unspecified with hypoxia: Secondary | ICD-10-CM | POA: Diagnosis not present

## 2022-12-05 DIAGNOSIS — K920 Hematemesis: Secondary | ICD-10-CM | POA: Diagnosis not present

## 2022-12-05 LAB — HEMOGLOBIN AND HEMATOCRIT, BLOOD
HCT: 23.7 % — ABNORMAL LOW (ref 39.0–52.0)
HCT: 24.2 % — ABNORMAL LOW (ref 39.0–52.0)
Hemoglobin: 7.7 g/dL — ABNORMAL LOW (ref 13.0–17.0)
Hemoglobin: 7.8 g/dL — ABNORMAL LOW (ref 13.0–17.0)

## 2022-12-05 MED ORDER — ORAL CARE MOUTH RINSE: 15.0000 mL | OROMUCOSAL | Status: DC | PRN

## 2022-12-05 MED ORDER — PANTOPRAZOLE SODIUM 40 MG PO TBEC
40.0000 mg | DELAYED_RELEASE_TABLET | Freq: Two times a day (BID) | ORAL | Status: DC
  Administered 2022-12-05 – 2022-12-08 (×8): 40 mg via ORAL
  Filled 2022-12-05 (×8): qty 1

## 2022-12-05 MED ORDER — ADULT MULTIVITAMIN W/MINERALS CH
1.0000 | ORAL_TABLET | Freq: Every day | ORAL | Status: DC
  Administered 2022-12-05 – 2022-12-07 (×3): 1 via ORAL
  Filled 2022-12-05 (×3): qty 1

## 2022-12-05 MED ORDER — MIRTAZAPINE 15 MG PO TABS
15.0000 mg | ORAL_TABLET | Freq: Every day | ORAL | Status: DC
  Administered 2022-12-05 – 2022-12-08 (×4): 15 mg via ORAL
  Filled 2022-12-05 (×4): qty 1

## 2022-12-05 MED ORDER — TRAMADOL HCL 50 MG PO TABS
50.0000 mg | ORAL_TABLET | Freq: Three times a day (TID) | ORAL | Status: DC | PRN
  Administered 2022-12-05 – 2022-12-06 (×2): 50 mg via ORAL
  Filled 2022-12-05 (×2): qty 1

## 2022-12-05 MED ORDER — AZITHROMYCIN 250 MG PO TABS
500.0000 mg | ORAL_TABLET | Freq: Every day | ORAL | Status: AC
  Administered 2022-12-05 – 2022-12-07 (×3): 500 mg via ORAL
  Filled 2022-12-05 (×3): qty 2

## 2022-12-05 MED ORDER — OXYCODONE HCL 5 MG PO TABS
10.0000 mg | ORAL_TABLET | Freq: Four times a day (QID) | ORAL | Status: DC | PRN
  Administered 2022-12-05 – 2022-12-06 (×3): 10 mg via ORAL
  Filled 2022-12-05 (×3): qty 2

## 2022-12-05 MED ORDER — MAGNESIUM OXIDE -MG SUPPLEMENT 400 (240 MG) MG PO TABS
400.0000 mg | ORAL_TABLET | Freq: Two times a day (BID) | ORAL | Status: DC
  Administered 2022-12-05 – 2022-12-08 (×8): 400 mg via ORAL
  Filled 2022-12-05 (×8): qty 1

## 2022-12-05 MED ORDER — ROSUVASTATIN CALCIUM 5 MG PO TABS
5.0000 mg | ORAL_TABLET | ORAL | Status: DC
  Administered 2022-12-05 – 2022-12-07 (×2): 5 mg via ORAL
  Filled 2022-12-05 (×3): qty 1

## 2022-12-05 MED ORDER — OXYCODONE HCL 5 MG PO TABS
10.0000 mg | ORAL_TABLET | Freq: Two times a day (BID) | ORAL | Status: DC | PRN
  Administered 2022-12-05: 10 mg via ORAL
  Filled 2022-12-05: qty 2

## 2022-12-05 MED ORDER — CYCLOBENZAPRINE HCL 10 MG PO TABS
10.0000 mg | ORAL_TABLET | Freq: Three times a day (TID) | ORAL | Status: DC
  Administered 2022-12-05 – 2022-12-06 (×4): 10 mg via ORAL
  Filled 2022-12-05 (×4): qty 1

## 2022-12-05 MED ORDER — CEFDINIR 300 MG PO CAPS
300.0000 mg | ORAL_CAPSULE | Freq: Every day | ORAL | Status: DC
  Administered 2022-12-06 – 2022-12-07 (×2): 300 mg via ORAL
  Filled 2022-12-05 (×3): qty 1

## 2022-12-05 NOTE — Progress Notes (Addendum)
Daily Progress Note  DOA: 12/03/2022 Hospital Day: 3 Chief Complaint: dark stool diarrhea   Assessment and Plan:    Brief Narrative:  Sean Rose is a 67 y.o. year old male with multiple medical problems not limited to HTN,  CAD s/p remote stenting, PAD on Plavix, stage IV non-small cell lung cancer, severe COPD, CKD, GERD, hiatal hernia, candida esophagitis,  and IDA. We saw him in consult 5/7 for dark stool / diarrhea.Refer to that note for details.    Acute dark emesis and dark (heme negative) diarrhea of unclear etiology. In setting of prednisone and daily asa could have erosive disease / PUD but again, stools heme negative. Unrevealing CT AP except for a fluid filled gastric fundus and  thickening of the gastric body and antrum which are chronic findings  -Reviewed some of his prior CT scans and the gastric wall thickening dates back to a CT scan in March 2023. EGD 5 months prior to that was without any major gastric findings   -No further N/V/D. -Will give regular diet.  -If tolerates solids can change PPI to PO. Would continue at BID dosing for a couple of weeks and can then change to once daily. -Dr. Russella Dar can decide when he is comfortable resuming Plavix and ASA  Acute hypoxic respiratory failure / PNA / moderate R pleural effusion / severe COPD  Chronic IDA, followed by Hematology.  Hgb ihas continued to drift with IVF and is down to 7.7 from baseline of 9.7. Ferritin last month was in 900s   CAD with history of remote stents / PAD. On Plavix and ASA. -Plavix on hold.    Metastatic non-cell lung cancer s/p RUL lobectomy with lymph node dissection, adjuvant chemotherapy and palliative radiotherapy to metastatic bone lesion.  Currently on observation.    Attending Physician Note   I have taken an interval history, reviewed the chart and examined the patient. I performed a substantive portion of this encounter, including complete performance of at least one of the key  components, in conjunction with the APP. I agree with the APP's note, impression and recommendations with my edits. My additional impressions and recommendations are as follows.   N, V, D resolving, possibly a self-limited infectious process. FOBT negative. PPI bid for 2 weeks then PPI qd long term for possible gastritis, esophagitis. Advance to regular diet. OK to resume Plavix, ASA from GI standpoint. No additional GI evaluation is planned. GI signing off. Outpatient GI follow up with Dr. Leone Payor as needed.   Claudette Head, MD [facility] See AMION, [facility], for our on call provider     Subjective / New Events:   No further nausea / vomiting. No BMs /GI bleeding   Objective:   Recent Labs    12/03/22 0928 12/03/22 1846 12/04/22 0054 12/04/22 0746 12/04/22 1525 12/05/22 0001 12/05/22 0750  WBC 16.2*  --  11.9* 11.1*  --   --   --   HGB 11.6*   < > 7.6* 8.1* 8.4* 7.8* 7.7*  HCT 34.5*   < > 22.6* 25.5* 26.1* 23.7* 24.2*  PLT 529*  --  350 373  --   --   --    < > = values in this interval not displayed.   BMET Recent Labs    12/03/22 0928 12/04/22 0054  NA 128* 129*  K 3.1* 3.3*  CL 85* 97*  CO2 30 24  GLUCOSE 132* 132*  BUN 50* 43*  CREATININE 1.75*  1.56*  CALCIUM 9.6 8.4*   LFT Recent Labs    12/04/22 0054  PROT 5.3*  ALBUMIN 2.5*  AST 14*  ALT 12  ALKPHOS 46  BILITOT 0.2*   PT/INR Recent Labs    12/04/22 0054  LABPROT 14.7  INR 1.1     Imaging:  CT ABDOMEN PELVIS W CONTRAST CLINICAL DATA:  Bowel obstruction suspected, persistent vomiting and hematemesis metastatic lung cancer * Tracking Code: BO *  EXAM: CT ABDOMEN AND PELVIS WITH CONTRAST  TECHNIQUE: Multidetector CT imaging of the abdomen and pelvis was performed using the standard protocol following bolus administration of intravenous contrast.  RADIATION DOSE REDUCTION: This exam was performed according to the departmental dose-optimization program which includes automated exposure  control, adjustment of the mA and/or kV according to patient size and/or use of iterative reconstruction technique.  CONTRAST:  80mL OMNIPAQUE IOHEXOL 350 MG/ML SOLN  COMPARISON:  CT chest abdomen pelvis, 09/30/2022  FINDINGS: Lower chest: Please see separately reported examination of the chest.  Hepatobiliary: No solid liver abnormality is seen. No gallstones, gallbladder wall thickening, or biliary dilatation.  Pancreas: Unremarkable. No pancreatic ductal dilatation or surrounding inflammatory changes.  Spleen: Normal in size without significant abnormality.  Adrenals/Urinary Tract: Adrenal glands are unremarkable. Kidneys are normal, without renal calculi, solid lesion, or hydronephrosis. Bladder is unremarkable.  Stomach/Bowel: The gastric fundus is fluid-filled, thickening of the gastric body and antrum appearance similar to prior examination (series 19, image 20). Appendix is not clearly visualized and may be surgically absent. No evidence of bowel wall thickening, distention, or inflammatory changes.  Vascular/Lymphatic: Severe aortic atherosclerosis. Bilateral common iliac artery stents. No enlarged abdominal or pelvic lymph nodes.  Reproductive: No mass or other significant abnormality.  Other: No abdominal wall hernia or abnormality. No ascites.  Musculoskeletal: New superior endplate deformity of L4 (series 26, image 71).  IMPRESSION: 1. No acute CT findings of the abdomen or pelvis to explain vomiting or hematemesis. No evidence of bowel obstruction. 2. The gastric fundus is fluid-filled, thickening of the gastric body and antrum appearance similar to prior examination. Consider endoscopic evaluation. 3. New superior endplate deformity of L4 correlate poor acute pain and point tenderness.  Aortic Atherosclerosis (ICD10-I70.0).  Electronically Signed   By: Jearld Lesch M.D.   On: 12/03/2022 13:46 CT Angio Chest PE W and/or Wo Contrast CLINICAL DATA:   PE suspected, metastatic lung cancer * Tracking Code: BO *  EXAM: CT ANGIOGRAPHY CHEST WITH CONTRAST  TECHNIQUE: Multidetector CT imaging of the chest was performed using the standard protocol during bolus administration of intravenous contrast. Multiplanar CT image reconstructions and MIPs were obtained to evaluate the vascular anatomy.  RADIATION DOSE REDUCTION: This exam was performed according to the departmental dose-optimization program which includes automated exposure control, adjustment of the mA and/or kV according to patient size and/or use of iterative reconstruction technique.  CONTRAST:  80mL OMNIPAQUE IOHEXOL 350 MG/ML SOLN  COMPARISON:  09/30/2022  FINDINGS: Cardiovascular: Satisfactory opacification of the pulmonary arteries to the segmental level. No evidence of pulmonary embolism. Normal heart size. Left and right coronary artery calcifications. No pericardial effusion. Aortic atherosclerosis.  Mediastinum/Nodes: Unchanged subcarinal lymph node, poorly visualized on this early arterial phase examination (series 9, image 70). Fluid-filled esophagus. Thyroid gland and trachea demonstrate no significant findings.  Lungs/Pleura: Status post right upper lobectomy. Severe underlying emphysema. Unchanged moderate right, small left pleural effusions, likely chronic and loculated, with evidence of prior left-sided talc pleurodesis. New heterogeneous and ground-glass airspace opacity throughout the  lung bases, particularly the left lung base (series 17, image 103).  Upper Abdomen: No acute abnormality.  Musculoskeletal: No chest wall abnormality. New inferior endplate wedge deformity of T7 (series 13, image 83). Unchanged mild superior endplate deformity of T11 (series 13, image 83).  Review of the MIP images confirms the above findings.  IMPRESSION: 1. Negative examination for pulmonary embolism. 2. New heterogeneous and ground-glass airspace opacity  throughout the lung bases, particularly the left lung base, consistent with infection or aspiration. 3. Unchanged moderate right, small left pleural effusions, likely chronic and loculated, with evidence of prior left-sided talc pleurodesis. 4. Status post right upper lobectomy. 5. Severe underlying emphysema. 6. Coronary artery disease. 7. New inferior endplate wedge deformity of T7. Correlate for acutely referable pain and point tenderness.  Aortic Atherosclerosis (ICD10-I70.0) and Emphysema (ICD10-J43.9).  Electronically Signed   By: Jearld Lesch M.D.   On: 12/03/2022 13:38 DG Chest Portable 1 View CLINICAL DATA:  Provided history: Shortness of breath. History of stage IV lung cancer.  EXAM: PORTABLE CHEST 1 VIEW  COMPARISON:  Chest CT 09/30/2022. Prior chest radiographs 09/17/2022 and earlier.  FINDINGS: Right chest infusion port catheter with tip at the level of the superior cavoatrial junction. The cardiomediastinal silhouette is unchanged. Aortic atherosclerosis. Small right pleural effusion. Small to moderate left pleural effusion with fluid at the left base and left apex. Underlying emphysema. Ill-defined opacities at the left lung base, slightly progressed from the prior examination of 09/17/2022. Levocurvature of the thoracic spine. Redemonstrated sclerotic changes within the left scapula.  IMPRESSION: 1. Small-to-moderate left pleural effusion (with fluid at both the left base and apex), mildly increased from the prior chest radiographs of 09/17/2022. 2. Small right pleural effusion, similar to the prior exam. 3. Ill-defined opacities at the left lung base mildly increased from the prior examination. This at least partly reflects atelectasis and scarring. However, superimposed pneumonia is difficult to exclude. Correlate clinically and consider short-interval radiographic follow-up. 4. Aortic Atherosclerosis (ICD10-I70.0) and Emphysema  (ICD10-J43.9).  Electronically Signed   By: Jackey Loge D.O.   On: 12/03/2022 10:56     Scheduled inpatient medications:   azithromycin  500 mg Oral QAC supper   Chlorhexidine Gluconate Cloth  6 each Topical Daily   cyclobenzaprine  10 mg Oral TID   feeding supplement (KATE FARMS STANDARD 1.4)  325 mL Oral Daily   fluticasone furoate-vilanterol  1 puff Inhalation Daily   And   umeclidinium bromide  1 puff Inhalation Daily   magnesium oxide  400 mg Oral BID   mirtazapine  15 mg Oral QHS   multivitamin with minerals  1 tablet Oral Daily   pantoprazole (PROTONIX) IV  40 mg Intravenous Q12H   rosuvastatin  5 mg Oral QODAY   sodium chloride flush  3 mL Intravenous Q12H   Continuous inpatient infusions:   sodium chloride Stopped (12/05/22 0612)   cefTRIAXone (ROCEPHIN)  IV Stopped (12/04/22 1407)   dextrose 5 % and 0.9% NaCl 100 mL/hr at 12/05/22 0800   PRN inpatient medications: sodium chloride, acetaminophen **OR** acetaminophen, doxylamine (Sleep), guaiFENesin-dextromethorphan, metoprolol tartrate, ondansetron **OR** ondansetron (ZOFRAN) IV, mouth rinse, oxyCODONE, sodium chloride flush  Vital signs in last 24 hours: Temp:  [96.4 F (35.8 C)-98.1 F (36.7 C)] 96.4 F (35.8 C) (05/09 0800) Pulse Rate:  [66-104] 68 (05/09 0800) Resp:  [14-36] 14 (05/09 0800) BP: (89-142)/(60-95) 124/65 (05/09 0800) SpO2:  [92 %-100 %] 100 % (05/09 0807) FiO2 (%):  [28 %] 28 % (05/09 0807)  Last BM Date : 12/03/22  Intake/Output Summary (Last 24 hours) at 12/05/2022 0946 Last data filed at 12/05/2022 0800 Gross per 24 hour  Intake 3901.19 ml  Output 3875 ml  Net 26.19 ml    Intake/Output from previous day: 05/08 0701 - 05/09 0700 In: 3921 [P.O.:1290; I.V.:2325.8; IV Piggyback:305.1] Out: 4075 [Urine:4075] Intake/Output this shift: Total I/O In: 200.2 [I.V.:200.2] Out: -    Physical Exam:  General: Alert thin male in NAD Heart:  Regular rate and rhythm.  Pulmonary: Normal  respiratory effort Abdomen: Soft, nondistended, nontender. Normal bowel sounds. Extremities: No lower extremity edema  Neurologic: Alert and oriented Psych: Pleasant. Cooperative. Insight appears normal.    Principal Problem:   Hematemesis Active Problems:   Hypokalemia   Nausea vomiting and diarrhea   Hyponatremia   Adenocarcinoma of right lung, stage 4 (HCC)   CKD (chronic kidney disease) stage 3, GFR 30-59 ml/min (HCC)   Lobar pneumonia (HCC)   Diarrhea of presumed infectious origin     LOS: 1 day   Willette Cluster ,NP 12/05/2022, 9:46 AM

## 2022-12-05 NOTE — Progress Notes (Signed)
[facility]  - [place] at [facility]   PATIENT NAME: Sean Rose    MR#:  [id number]  DATE OF BIRTH:  March 30, 1956  SUBJECTIVE:  patient came in with coffee ground emesis has history of COPD severe and stage IV non-small cell lung cancer. Denies any vomiting. Tolerating PO diet. Advanced soft. Wife at bedside. Complains of back pain and tired of being in the hospital wants to go home.   VITALS:  Blood pressure (!) 154/81, pulse 75, temperature 98 F (36.7 C), temperature source Oral, resp. rate 20, height 5\' 7"  (1.702 m), weight 40.1 kg, SpO2 94 %.  PHYSICAL EXAMINATION:   GENERAL:  67 y.o.-year-old patient with no acute distress. Weak deconditioned malnourished LUNGS: distant breath sounds bilaterally, no wheezing CARDIOVASCULAR: S1, S2 normal. No murmur   ABDOMEN: Soft, nontender, nondistended. Bowel sounds present.  EXTREMITIES: No  edema b/l.    NEUROLOGIC: nonfocal  patient is alert and awake SKIN: No obvious rash, lesion, or ulcer.   LABORATORY PANEL:  CBC Recent Labs  Lab 12/04/22 0746 12/04/22 1525 12/05/22 0750  WBC 11.1*  --   --   HGB 8.1*   < > 7.7*  HCT 25.5*   < > 24.2*  PLT 373  --   --    < > = values in this interval not displayed.    Chemistries  Recent Labs  Lab 12/03/22 0928 12/04/22 0054  NA 128* 129*  K 3.1* 3.3*  CL 85* 97*  CO2 30 24  GLUCOSE 132* 132*  BUN 50* 43*  CREATININE 1.75* 1.56*  CALCIUM 9.6 8.4*  MG 2.5*  --   AST 17 14*  ALT 15 12  ALKPHOS 66 46  BILITOT 0.7 0.2*    Assessment and Plan Sean Rose is a 67 y.o. male with medical history significant of hypertension, hyperlipidemia, CAD status post stent placement, PAD status post iliac stent on Plavix, a stage IV metastatic non-small cell lung cancer treated with [employer] and Keytruda, COPD stage IV, history of right pneumothorax status post bronchoscopy for intrabronchial valve placement 09/06/2022, iron deficiency anemia who presents complaining of nausea,  vomiting, coffee-ground emesis, cough, diarrhea.   CT angio: New heterogeneous and ground-glass airspace opacity throughout the lung bases, particularly the left lung base, consistent with infection or aspiration. Unchanged moderate right, small left pleural effusions, likely chronic and loculated, with evidence of prior left-sided talc pleurodesis. Status post right upper lobectomy. Severe underlying emphysema.  Coronary artery disease. New inferior endplate wedge deformity of T7. Correlate for acutely referable pain and point tenderness.  CT abdomen Pelvis:  No acute CT findings of the abdomen or pelvis to explain vomiting or hematemesis. No evidence of bowel obstruction. The gastric fundus is fluid-filled, thickening of the gastric body and antrum appearance similar to prior examination. Consider endoscopic evaluation.   Hematemesis coffee ground emesis -- seen by G.I. Dr. Russella Dar patient's aspirin Plavix on hold.-- Will have him follow-up as outpatient once hemoglobin stable hopefully can be resumed -- hemoglobin down to 7.7. Baseline around 9.7 -- I recommended patient regarding one unit blood transfusion. He is not wanting blood transfusion now. His wife also persuaded him to take it however he wants to wait -- he tells me he is feeling better and does not want it.  Acute hypoxic respiratory failure in the setting of pneumonia unchanged moderate right and small left pleural effusion likely chronic status post right upper lobe lobectomy COPD stage IV lung cancer -- patient currently  is not on any treatment. Continue IV Rocephin and Zithromax -- continue PRN inhalers -- we to room air if not able to then assess for home oxygen  Hyponatremia -- IV fluids  CKD stage IIIa -- stable  Chronic back pain -- PRN oxycodone -- added ultram  History of CAD -- holding aspirin Plavix--- patient can resume as outpatient after he follows up with G.I. or PCP and his hemoglobin remains stable  PT  OT to see patient patient wanted to go home today. I recommended patient to stay for PT OT and if he changes his mind to get blood transfusion tomorrow prior to discharge him.  Procedures: Family communication : wife at bedside Consults :GI CODE STATUS: full DVT Prophylaxis :scd Level of care: Med-Surg Status is: Inpatient Remains inpatient appropriate because: COPD, hemetemesis    TOTAL TIME TAKING CARE OF THIS PATIENT: 35 minutes.  >50% time spent on counselling and coordination of care  Note: This dictation was prepared with Dragon dictation along with smaller phrase technology. Any transcriptional errors that result from this process are unintentional.  Enedina Finner M.D    [facility]   CC: Primary care physician; [name], NP

## 2022-12-05 NOTE — Progress Notes (Signed)
PT Cancellation Note  Patient Details Name: Sean Rose MRN: [id number] DOB: [birth year]   Cancelled Treatment:    Reason Eval/Treat Not Completed: Other (comment) (lunch arrived). As pt stating PLOF, lunch tray arrived. Pt requesting therapist check back. Will continue efforts.    Tori Jaymeson Mengel PT, DPT 12/05/22, 1:24 PM

## 2022-12-05 NOTE — Progress Notes (Signed)
PHARMACY NOTE:  ANTIMICROBIAL RENAL DOSAGE ADJUSTMENT  Current antimicrobial regimen includes a mismatch between antimicrobial dosage and estimated renal function.  As per policy approved by the Pharmacy & Therapeutics and Medical Executive Committees, the antimicrobial dosage will be adjusted accordingly.  Current antimicrobial dosage:  cefdinir 300 mg bid   Indication: PNA  Renal Function:  Estimated Creatinine Clearance: 26.4 mL/min (A) (by C-G formula based on SCr of 1.56 mg/dL (H)). []      On intermittent HD, scheduled: []      On CRRT    Antimicrobial dosage has been changed to:  cefdinir 300 mg daily     Thank you for allowing pharmacy to be a part of this patient's care.  Lucia Gaskins, [facility] 12/05/2022 3:32 PM

## 2022-12-05 NOTE — Progress Notes (Signed)
  Transition of Care [facility]) Screening Note   Patient Details  Name: Sean Rose Date of Birth: [birth year]   Transition of Care [facility]) CM/SW Contact:    Adrian Prows, RN Phone Number: 12/05/2022, 3:34 PM    Transition of Care Department [facility]) has reviewed patient and no TOC needs have been identified at this time. We will continue to monitor patient advancement through interdisciplinary progression rounds. If new patient transition needs arise, please place a TOC consult.

## 2022-12-06 ENCOUNTER — Inpatient Hospital Stay (HOSPITAL_COMMUNITY): Payer: BC Managed Care – PPO

## 2022-12-06 DIAGNOSIS — R112 Nausea with vomiting, unspecified: Secondary | ICD-10-CM

## 2022-12-06 DIAGNOSIS — R197 Diarrhea, unspecified: Secondary | ICD-10-CM | POA: Diagnosis not present

## 2022-12-06 LAB — CBC
HCT: 29.2 % — ABNORMAL LOW (ref 39.0–52.0)
Hemoglobin: 9.5 g/dL — ABNORMAL LOW (ref 13.0–17.0)
MCH: 30.7 pg (ref 26.0–34.0)
MCHC: 32.5 g/dL (ref 30.0–36.0)
MCV: 94.5 fL (ref 80.0–100.0)
Platelets: 401 10*3/uL — ABNORMAL HIGH (ref 150–400)
RBC: 3.09 MIL/uL — ABNORMAL LOW (ref 4.22–5.81)
RDW: 16.4 % — ABNORMAL HIGH (ref 11.5–15.5)
WBC: 17.1 10*3/uL — ABNORMAL HIGH (ref 4.0–10.5)
nRBC: 0 % (ref 0.0–0.2)

## 2022-12-06 LAB — BASIC METABOLIC PANEL
Anion gap: 11 (ref 5–15)
BUN: 14 mg/dL (ref 8–23)
CO2: 26 mmol/L (ref 22–32)
Calcium: 8.4 mg/dL — ABNORMAL LOW (ref 8.9–10.3)
Chloride: 93 mmol/L — ABNORMAL LOW (ref 98–111)
Creatinine, Ser: 1.23 mg/dL (ref 0.61–1.24)
GFR, Estimated: >60 mL/min (ref >60)
Glucose, Bld: 115 mg/dL — ABNORMAL HIGH (ref 70–99)
Potassium: 2.4 mmol/L — CL (ref 3.5–5.1)
Sodium: 130 mmol/L — ABNORMAL LOW (ref 135–145)

## 2022-12-06 LAB — MAGNESIUM: Magnesium: 1.2 mg/dL — ABNORMAL LOW (ref 1.7–2.4)

## 2022-12-06 MED ORDER — OXYCODONE HCL 5 MG PO TABS
10.0000 mg | ORAL_TABLET | Freq: Three times a day (TID) | ORAL | Status: DC | PRN
  Administered 2022-12-06 (×2): 10 mg via ORAL
  Filled 2022-12-06 (×2): qty 2

## 2022-12-06 MED ORDER — MAGNESIUM SULFATE 4 GM/100ML IV SOLN
4.0000 g | Freq: Once | INTRAVENOUS | Status: AC
  Administered 2022-12-06: 4 g via INTRAVENOUS
  Filled 2022-12-06: qty 100

## 2022-12-06 MED ORDER — KATE FARMS STANDARD 1.4 PO LIQD
325.0000 mL | Freq: Two times a day (BID) | ORAL | Status: DC
  Administered 2022-12-06: 325 mL via ORAL
  Filled 2022-12-06 (×8): qty 325

## 2022-12-06 MED ORDER — POTASSIUM CHLORIDE CRYS ER 20 MEQ PO TBCR
40.0000 meq | EXTENDED_RELEASE_TABLET | ORAL | Status: AC
  Administered 2022-12-06 (×2): 40 meq via ORAL
  Filled 2022-12-06 (×2): qty 2

## 2022-12-06 NOTE — Evaluation (Signed)
Physical Therapy Evaluation Patient Details Name: Sean Rose MRN: [id number] DOB: January 13, 1956 Today's Date: 12/06/2022  History of Present Illness  Sean Rose is a 67 y.o. male presents complaining of nausea, vomiting, coffee-ground emesis, cough, diarrhea. PMH: HTN, hyperlipidemia, coronary artery disease s/p coronary stent placement 2007 and 2008, PAD s/p right  iliac stent 2007on Plavix, stage IV, metastatic non-small cell lung cancer carcinoma treated with chemo and Keytruda, COPD stage IV, severe emphysema, right pneumothorax s/p pleurodesis 06/2022 and s/p bronchoscopy for intrabronchial valve placement 09/06/2022, CKD, GERD and iron deficiency anemia.  Clinical Impression  Pt admitted with above diagnosis. Pt from home with spouse, has all DME, 2 sons locally who can assist as needed, ind with self care and amb varies between ind without AD to using w/c for community distances. Pt curently needing increased time and effort with bed mobility, no physical assist. Pt completes STS transfer from EOB and amb in hallway with min guard, on 2L O2, verbalizes SOB and noticeable, educated on pursed lip breathing and SpO2 91% and greater- RN notified. Cues for RW management, and increasing bil foot clearance with quick steps. Recommend HHPT at d/c with family support. Pt currently with functional limitations due to the deficits listed below (see PT Problem List). Pt will benefit from acute skilled PT to increase their independence and safety with mobility to allow discharge.          Recommendations for follow up therapy are one component of a multi-disciplinary discharge planning process, led by the attending physician.  Recommendations may be updated based on patient status, additional functional criteria and insurance authorization.  Follow Up Recommendations       Assistance Recommended at Discharge Intermittent Supervision/Assistance  Patient can return home with the following  A little  help with walking and/or transfers;A little help with bathing/dressing/bathroom;Assistance with cooking/housework;Assist for transportation;Help with stairs or ramp for entrance    Equipment Recommendations None recommended by PT  Recommendations for Other Services       Functional Status Assessment Patient has had a recent decline in their functional status and demonstrates the ability to make significant improvements in function in a reasonable and predictable amount of time.     Precautions / Restrictions Precautions Precautions: Fall Precaution Comments: monitor O2 and HR Restrictions Weight Bearing Restrictions: No      Mobility  Bed Mobility Overal bed mobility: Needs Assistance Bed Mobility: Supine to Sit, Sit to Supine  Supine to sit: Supervision Sit to supine: Modified independent (Device/Increase time)  General bed mobility comments: increased time and effort with use of bedrail to come to sitting, modified ind to return to supine    Transfers Overall transfer level: Needs assistance Equipment used: Rolling walker (2 wheels) Transfers: Sit to/from Stand Sit to Stand: Min guard  General transfer comment: steadying assist to power up, "a little" dizziness upon standing improving with time    Ambulation/Gait Ambulation/Gait assistance: Min guard Gait Distance (Feet): 50 Feet Assistive device: Rolling walker (2 wheels) Gait Pattern/deviations: Step-through pattern, Decreased stride length Gait velocity: dec  General Gait Details: step through gait pattern, quick steps, no overt LOB, dyspnea noted, on 2L with SpO2 91-97% and HR Up to 126 max with amb  Stairs            Wheelchair Mobility    Modified Rankin (Stroke Patients Only)       Balance Overall balance assessment: Needs assistance Sitting-balance support: Feet supported Sitting balance-Leahy Scale: Good  Standing balance support:  During functional activity Standing balance-Leahy Scale:  Fair Standing balance comment: fair static without RW, using RW for amb          Pertinent Vitals/Pain Pain Assessment Pain Assessment: 0-10 Pain Score: 4  Pain Location: shoulder and upper back Pain Descriptors / Indicators: Discomfort Pain Intervention(s): Limited activity within patient's tolerance, Monitored during session    Home Living Family/patient expects to be discharged to:: Private residence Living Arrangements: Spouse/significant other Available Help at Discharge: Family;Available 24 hours/day Type of Home: House Home Access: Stairs to enter   [employer] of Steps: 4 front without handrails, 8 back with bil handrails   Home Layout: One level Home Equipment: Cane - single point;Scientist, research (medical) (4 wheels);Grab bars - tub/shower      Prior Function Prior Level of Function : Independent/Modified Independent  Mobility Comments: pt reports using SPC or rollator, pt able to amb some household distances without AD, spouse assisting with stairs as needed, w/c for community distances ADLs Comments: pt reports ind with self care, spouse completes household chores     Hand Dominance        Extremity/Trunk Assessment   Upper Extremity Assessment Upper Extremity Assessment: Defer to OT evaluation    Lower Extremity Assessment Lower Extremity Assessment: Generalized weakness (AROM WFL, strength grossly 4-/5, denies numbness/tingling)    Cervical / Trunk Assessment Cervical / Trunk Assessment: Normal  Communication   Communication: No difficulties  Cognition Arousal/Alertness: Awake/alert Behavior During Therapy: WFL for tasks assessed/performed Overall Cognitive Status: Within Functional Limits for tasks assessed           General Comments      Exercises     Assessment/Plan    PT Assessment Patient needs continued PT services  PT Problem List Decreased strength;Decreased activity tolerance;Decreased balance;Decreased knowledge of  use of DME;Decreased knowledge of precautions;Cardiopulmonary status limiting activity       PT Treatment Interventions DME instruction;Gait training;Stair training;Functional mobility training;Therapeutic activities;Therapeutic exercise;Balance training;Neuromuscular re-education;Patient/family education    PT Goals (Current goals can be found in the Care Plan section)  Acute Rehab PT Goals Patient Stated Goal: return home PT Goal Formulation: With patient Time For Goal Achievement: 12/20/22 Potential to Achieve Goals: Good    Frequency Min 1X/week     Co-evaluation               AM-PAC PT "6 Clicks" Mobility  Outcome Measure Help needed turning from your back to your side while in a flat bed without using bedrails?: A Little Help needed moving from lying on your back to sitting on the side of a flat bed without using bedrails?: A Little Help needed moving to and from a bed to a chair (including a wheelchair)?: A Little Help needed standing up from a chair using your arms (e.g., wheelchair or bedside chair)?: A Little Help needed to walk in hospital room?: A Little Help needed climbing 3-5 steps with a railing? : A Little 6 Click Score: 18    End of Session Equipment Utilized During Treatment: Gait belt;Oxygen Activity Tolerance: Patient tolerated treatment well Patient left: in bed;with call bell/phone within reach;with nursing/sitter in room Nurse Communication: Mobility status PT Visit Diagnosis: Other abnormalities of gait and mobility (R26.89);Muscle weakness (generalized) (M62.81)    Time: 1610-9604 PT Time Calculation (min) (ACUTE ONLY): 29 min   Charges:   PT Evaluation $PT Eval Low Complexity: 1 Low PT Treatments $Gait Training: 8-22 mins         Domenick Bookbinder PT, DPT  12/06/22, 10:03 AM

## 2022-12-06 NOTE — Evaluation (Signed)
Occupational Therapy Evaluation Patient Details Name: Sean Rose MRN: [id number] DOB: Dec 13, 1955 Today's Date: 12/06/2022   History of Present Illness  (Sean Rose is a 67 yr old male admitted to the hospital with cough, diarrhea, nausea, and vomiting. He was found to have acute hypoxic respiratory failure due to PNA. HTN, HLD, CAD, PAD, stage 4 lung CA, COPD stage 4, pneumothorax, emphysema)   Clinical Impression   The pt was noted to be on 2L O2 via nasal cannula; his O2 saturation was 97% at rest and 93% with activity. His heart rate increased up to 112 bpm with activity. He reports baseline endurance deficits, often requiring increased time and effort for tasks.  He will benefit from further OT services for energy conservation strategies, needed to facilitate improved participation and activity tolerance during daily activities. Upon hospital discharge, he expresses a desire to receive outpatient PT services, as he has previously received such services and found it to be effective.     Recommendations for follow up therapy are one component of a multi-disciplinary discharge planning process, led by the attending physician.  Recommendations may be updated based on patient status, additional functional criteria and insurance authorization.   Assistance Recommended at Discharge PRN  Patient can return home with the following Assistance with cooking/housework;Assist for transportation    Functional Status Assessment  Patient has had a recent decline in their functional status and demonstrates the ability to make significant improvements in function in a reasonable and predictable amount of time.  Equipment Recommendations  Tub/shower bench       Precautions / Restrictions Precautions Precaution Comments: monitor O2 and HR Restrictions Weight Bearing Restrictions: No      Mobility Bed Mobility Overal bed mobility: Needs Assistance Bed Mobility: Supine to Sit, Sit to Supine      Supine to sit: Supervision, HOB elevated Sit to supine: Supervision        Transfers Overall transfer level: Needs assistance Equipment used: Rolling walker (2 wheels) Transfers: Sit to/from Stand Sit to Stand: Supervision          Balance       Sitting balance - Comments: static sitting-good. dynamic sitting-fair+       Standing balance comment: SBA with RW in static standing. min guard assist with RW for dynamic standing            ADL either performed or assessed with clinical judgement   ADL Overall ADL's : Needs assistance/impaired Eating/Feeding: Independent;Sitting   Grooming: Set up;Sitting           Upper Body Dressing : Set up;Sitting Upper Body Dressing Details (indicate cue type and reason): simulated seated EOB Lower Body Dressing: Set up;Supervision/safety Lower Body Dressing Details (indicate cue type and reason): He doffed then donned his sock seated EOB. 1 lateral loss of balance noted. Toilet Transfer: Min guard;Ambulation;Rolling walker (2 wheels) Toilet Transfer Details (indicate cue type and reason): at bathroom level, based on clinical judgement        *OT recommended the pt consider a tub bench for bathing in the home, given his reported reluctance to transfer into and out of the tub for safety reasons. He was also educated on implementing pursed lip breathing exercises during activity, given shortness of breath with activity. He was further educated on simple energy conservation strategies to implement as needed during daily activities; an educational handout was issued.          Vision Patient Visual Report: No change from baseline  Pertinent Vitals/Pain Pain Assessment Pain Assessment:  (he did not specifically quantify the pain, however stated the pain was not severe) Pain Location: shoulder and upper back Pain Intervention(s): RN gave pain meds during session     Hand Dominance  (ambidextrous)    Extremity/Trunk Assessment Upper Extremity Assessment Upper Extremity Assessment: Overall WFL for tasks assessed   Lower Extremity Assessment Lower Extremity Assessment: Overall WFL for tasks assessed       Communication Communication Communication: No difficulties   Cognition Arousal/Alertness: Awake/alert Behavior During Therapy: WFL for tasks assessed/performed Overall Cognitive Status: Within Functional Limits for tasks assessed              General Comments: Oriented x4, able to follow commands without difficulty                Home Living Family/patient expects to be discharged to:: Private residence Living Arrangements: Spouse/significant other Available Help at Discharge: Family Type of Home: House Home Access: Stairs to enter Secretary/administrator of Steps: 4   Home Layout: One level     Bathroom Shower/Tub: Tub/shower unit         Home Equipment: Cane - single point;Wheelchair Financial trader (4 wheels)          Prior Functioning/Environment               Mobility Comments: Pt reports using SPC or rollator, pt able to amb some household distances without AD, spouse assisting with stairs as needed, w/c for community distances ADLs Comments:  (He was modified independent to independent with ADLs, often requiring increased time for tasks, given compromised endurance. He does not drive and his spouse manages the household chores.)        OT Problem List: Decreased strength;Impaired balance (sitting and/or standing);Pain;Cardiopulmonary status limiting activity;Decreased activity tolerance;Impaired sensation      OT Treatment/Interventions: Self-care/ADL training;Therapeutic exercise;Balance training;Energy conservation;Therapeutic activities;DME and/or AE instruction    OT Goals(Current goals can be found in the care plan section) Acute Rehab OT Goals Patient Stated Goal: to get back to doing simple desired activities, such as grilling &  sitting outside in his yard OT Goal Formulation: With patient Time For Goal Achievement: 12/20/22 Potential to Achieve Goals: Good ADL Goals Pt Will Perform Grooming: with modified independence;standing Pt Will Perform Lower Body Dressing: with modified independence;sit to/from stand Pt Will Transfer to Toilet: with modified independence;ambulating Pt Will Perform Toileting - Clothing Manipulation and hygiene: with modified independence;sit to/from stand Additional ADL Goal #1: The patient will independently verbalize and/or implement at least 3 energy conservation strategies during self-care tasks, in order to facilitate improved overall activity tolerance.  OT Frequency: Min 1X/week       AM-PAC OT "6 Clicks" Daily Activity     Outcome Measure Help from another person eating meals?: None Help from another person taking care of personal grooming?: None Help from another person toileting, which includes using toliet, bedpan, or urinal?: A Little Help from another person bathing (including washing, rinsing, drying)?: A Little Help from another person to put on and taking off regular upper body clothing?: None Help from another person to put on and taking off regular lower body clothing?: A Little 6 Click Score: 21   End of Session Equipment Utilized During Treatment: Gait belt;Rolling walker (2 wheels);Oxygen Nurse Communication: Mobility status  Activity Tolerance:   Patient left: in bed;with call bell/phone within reach;with family/visitor present  OT Visit Diagnosis: Unsteadiness on feet (R26.81);Pain;Muscle weakness (generalized) (M62.81)  Time: 1130-1208 OT Time Calculation (min): 38 min Charges:  OT General Charges $OT Visit: 1 Visit OT Evaluation $OT Eval Low Complexity: 1 Low OT Treatments $Self Care/Home Management : 8-22 mins   Reuben Likes, OTR/L 12/06/2022, 1:58 PM

## 2022-12-06 NOTE — TOC Initial Note (Signed)
Transition of Care [facility]) - Initial/Assessment Note    Patient Details  Name: Sean Rose MRN: [id number] Date of Birth: April 10, 1956  Transition of Care [facility]) CM/SW Contact:    Beckie Busing, RN Phone [phone]  12/06/2022, 1:38 PM  Clinical Narrative:                 [facility] consulted for patient with home health recommendations. CM at bedside introduces self and explained [facility] PT recommendations. Patient states that he normally manages independently at home and that he has attempted home health in the past and it did not work with his schedule. Patient and wife agree that they would rather not set up home health services at this time but will follow for possible outpatient therapy. Patient and wife do not want outpatient therapy set up at this time due to not sure of final plans and states that there has been some mention of hospice. CM has provided wife with CM number to call if patient or wife changes mind. TOC will continue to follow.    Expected Discharge Plan: Hospice Medical Facility Barriers to Discharge: Continued Medical Work up   Patient Goals and CMS Choice   CMS Medicare.gov Compare Post Acute Care list provided to:: Patient Choice offered to / list presented to : Patient [place] ownership interest in [facility].provided to::  (n/a for [facility])    Expected Discharge Plan and Services In-house Referral: NA Discharge Planning Services: CM Consult   Living arrangements for the past 2 months: Single Family Home                 DME Arranged: N/A DME Agency: NA       HH Arranged: Refused HH (Refused at this time may revisit later per patient) HH Agency: NA        Prior Living Arrangements/Services Living arrangements for the past 2 months: Single Family Home Lives with:: Spouse Patient language and need for interpreter reviewed:: Yes Do you feel safe going back to the place where you live?: Yes      Need for Family Participation in Patient Care:  Yes (Comment) Care giver support system in place?: Yes (comment) Current home services:  (n/a) Criminal Activity/Legal Involvement Pertinent to Current Situation/Hospitalization: No - Comment as needed  Activities of Daily Living      Permission Sought/Granted Permission sought to share information with : Family Supports Permission granted to share information with : Yes, Verbal Permission Granted  Share Information with NAME: Sean Rose spouse [phone]     Permission granted to share info w Relationship: spouse  Permission granted to share info w Contact Information: [phone]  Emotional Assessment Appearance:: Appears stated age Attitude/Demeanor/Rapport: Gracious Affect (typically observed): Pleasant Orientation: : Oriented to Self, Oriented to Place, Oriented to  Time, Oriented to Situation Alcohol / Substance Use: Not Applicable Psych Involvement: No (comment)  Admission diagnosis:  Hematemesis [K92.0] Lobar pneumonia (HCC) [J18.1] Patient Active Problem List   Diagnosis Date Noted   Lobar pneumonia (HCC) 12/04/2022   Diarrhea of presumed infectious origin 12/04/2022   Hematemesis 12/03/2022   Iron deficiency anemia 10/07/2022   Protein-calorie malnutrition, severe 07/06/2022   Pneumothorax 06/25/2022   Spontaneous pneumothorax 06/06/2022   CKD (chronic kidney disease) stage 3, GFR 30-59 ml/min (HCC) 03/14/2022   Antineoplastic chemotherapy induced anemia 08/01/2021   Intractable nausea and vomiting 03/01/2021   Generalized abdominal pain 03/01/2021   Elevated lactic acid level 03/01/2021   Port-A-Cath in place 11/21/2020  Adenocarcinoma of right lung, stage 4 (HCC) 10/30/2020   Encounter for antineoplastic immunotherapy 10/30/2020   Bone metastasis 10/01/2020   Abdominal pain 12/07/2018   Appendicitis 12/07/2018   Goals of care, counseling/discussion 10/13/2018   Encounter for antineoplastic chemotherapy 10/13/2018   S/P Right VATS with Right Upper  Lobectomy, Lymph Node Sampling, Intercostal Nerve Block 09/14/2018   Pneumothorax after biopsy 09/02/2018   Chronic obstructive pulmonary disease (HCC) 07/30/2018   Syncope 07/23/2018   Antiplatelet or antithrombotic long-term use 07/14/2018   Nodule of upper lobe of right lung 07/14/2018   Multiple lung nodules on CT 07/14/2018   Nausea vomiting and diarrhea 10/30/2016   Hyponatremia 10/30/2016   Hypocalcemia 10/30/2016   Hypokalemia 04/14/2015   Hypomagnesemia 04/14/2015   Claudication (HCC) 04/13/2015   Essential hypertension 01/14/2014   Hyperlipidemia 01/14/2014   Thrombosed external hemorrhoids 12/07/2013   Chronic anticoagulation 01/20/2013   PVD (peripheral vascular disease) (HCC) 01/20/2013   Coronary atherosclerosis of native coronary artery 01/20/2013   GERD (gastroesophageal reflux disease) 01/20/2013   Benign esophageal stricture 01/20/2013   DENTAL CARIES 07/20/2010   PCP:  [name], NP Pharmacy:   [id number] [address] - [place], [place] - [address] [place] [address] Phone: [phone] Fax: [phone]  [id number] [place], [place] - [address] AT [facility] [address] [facility] [address] [facility] [address] [place] [address] Phone: [phone] Fax: [phone]  [facility] [address] [place] [address] Phone: [phone] Fax: [phone]     Social Determinants of Health (SDOH) Social History: SDOH Screenings   Food Insecurity: No Food Insecurity (07/04/2022)  Housing: Low Risk  (07/04/2022)  Transportation Needs: No Transportation Needs (07/04/2022)  Utilities: Not At Risk (07/04/2022)  Depression (PHQ2-9): Medium Risk (11/20/2022)  Tobacco Use: Medium Risk (11/27/2022)   SDOH Interventions:     Readmission Risk Interventions    12/06/2022    1:28 PM 06/10/2022   11:24 AM 06/10/2022   11:23 AM  Readmission Risk Prevention Plan   Transportation Screening   Complete  PCP or Specialist Appt within 5-7 Days  Complete   PCP or Specialist Appt within 3-5 Days Complete    Home Care Screening   Complete  Medication Review (RN CM)   Complete  HRI or Home Care Consult Complete    Social Work Consult for Recovery Care Planning/Counseling Complete    Palliative Care Screening Not Applicable    Medication Review [employer]) Complete

## 2022-12-06 NOTE — Progress Notes (Addendum)
[facility] Progress Note  Patient: Sean Rose     ZOX:[id number]  DOA: 12/03/2022   PCP: [name], NP       Brief hospital course: This is a 67 year old male with metastatic lung cancer, currently not on treatment, COPD stage IV, CAD with stenting, PAD with iliac stent, iron deficiency anemia who presents to the hospital for vomiting, diarrhea and dark stools. Upon further discussion, he admits to having a very sensitive gag reflex and over the past 2 days he has been gagging and vomiting.  He also admits to a cough with thick sputum over the past few days.  He believes he has had melena.  In the ED:  Pulse ox 86% on room air-tachycardic with heart rates in the 90s and low 100s, respiratory rate in the 20s Fecal occult blood negative Sodium 128, WBC count 16, hemoglobin 11 CTA revealed new heterogeneous and groundglass opacities throughout the lung bases particularly left, moderate right and small left pleural effusions likely chronic and loculated, right upper lobe lobectomy, severe underlying emphysema and new wedge-shaped deformity of T7  CT abdomen pelvis revealed that the gastric fundus was filled with fluid, there was thickening of the gastric body and antrum similar to prior exam.  The patient was diagnosed with pneumonia and started on antibiotics. At IV Protonix infusion was started, aspirin Plavix was held and a GI consult was requested.  Subjective:  Overall cough has improved. He is urinating frequently but small amounts which he states is new for him. He has no other complaints.   Assessment and Plan: Principal Problem:   Hematemesis/melena? Currently has no complaints of either and stool Hemoccult was negative - Appreciate GI evaluation-he has been started on full liquid diet and Protonix has been changed to twice daily -  Hgb improved to 9.5 which seems to be near his baseline  Active Problems:  Pneumonia with acute hypoxic respiratory failure and  tachycardia Leukocytosis -  ceftriaxone transitioned to Cefdinir on 5/10- cont azithromycin - WBC count was 16.2 and improved to ~ 11 but has now increased to 17.1 - p ox 91% on 2 L today - checking UA as well today due to urinary frequency    Hyponatremia - on sodium tabs at home - improved, he is drinking well and I will stop IVF - hold off on resuming sodium tabs  Severe malnutrition Body mass index is 13.85 kg/m. -He states that he is unable to tolerate most dietary supplements - Continue dietary supplements as tolerated while in the hospital    Adenocarcinoma of right lung, stage 4 -Status post right upper lobectomy with lymph node dissection - Palliative radiotherapy to metastasis in the scapular and left supraclavicular area - Unable to tolerate chemotherapy after 12 cycles - Severe weight loss and fatigue as noted - I will request a palliative consult today    Hypokalemia -Continue to replace - 2.4 today -Magnesium was 1.2    AKI-  - Cr improved from 1.75 to normal- follow prn    GOC: code status discussed with patient and his wife. DNR has been decided upon. Patient and wife open to meeting with palliative care in the hospital.     Code Status: Full Code Consultants: None Level of Care: Level of care: Med-Surg Total time on patient care: 35 minutes DVT prophylaxis:  SCDs Start: 12/03/22 1611     Objective:   Vitals:   12/05/22 2347 12/06/22 0427 12/06/22 0841 12/06/22 1334  BP: (!) 150/96 133/88  131/87  Pulse: 84 88  87  Resp: 20 20  20   Temp: 98.4 F (36.9 C) 98 F (36.7 C)  98.2 F (36.8 C)  TempSrc: Oral Oral  Oral  SpO2:  97% 99% 99%  Weight:      Height:       Filed Weights   12/03/22 0902 12/03/22 1730  Weight: 40.8 kg 40.1 kg   Exam: General exam: Appears comfortable  HEENT: oral mucosa moist Respiratory system: Clear to auscultation.  Cardiovascular system: S1 & S2 heard  Gastrointestinal system: Abdomen soft, non-tender,  nondistended. Normal bowel sounds   Extremities: No cyanosis, clubbing or edema Psychiatry:  Mood & affect appropriate.        CBC: Recent Labs  Lab 12/03/22 0928 12/03/22 1846 12/04/22 0054 12/04/22 0746 12/04/22 1525 12/05/22 0001 12/05/22 0750 12/06/22 0943  WBC 16.2*  --  11.9* 11.1*  --   --   --  17.1*  NEUTROABS 14.3*  --   --   --   --   --   --   --   HGB 11.6*   < > 7.6* 8.1* 8.4* 7.8* 7.7* 9.5*  HCT 34.5*   < > 22.6* 25.5* 26.1* 23.7* 24.2* 29.2*  MCV 91.8  --  92.2 94.4  --   --   --  94.5  PLT 529*  --  350 373  --   --   --  401*   < > = values in this interval not displayed.    Basic Metabolic Panel: Recent Labs  Lab 12/03/22 0928 12/04/22 0054 12/06/22 0942 12/06/22 0943  NA 128* 129*  --  130*  K 3.1* 3.3*  --  2.4*  CL 85* 97*  --  93*  CO2 30 24  --  26  GLUCOSE 132* 132*  --  115*  BUN 50* 43*  --  14  CREATININE 1.75* 1.56*  --  1.23  CALCIUM 9.6 8.4*  --  8.4*  MG 2.5*  --  1.2*  --     GFR: Estimated Creatinine Clearance: 33.5 mL/min (by C-G formula based on SCr of 1.23 mg/dL).  Scheduled Meds:  azithromycin  500 mg Oral QAC supper   cefdinir  300 mg Oral Daily   Chlorhexidine Gluconate Cloth  6 each Topical Daily   cyclobenzaprine  10 mg Oral TID   feeding supplement (KATE [employer]ARMS STANDARD 1.4)  325 mL Oral Daily   fluticasone furoate-vilanterol  1 puff Inhalation Daily   And   umeclidinium bromide  1 puff Inhalation Daily   magnesium oxide  400 mg Oral BID   mirtazapine  15 mg Oral QHS   multivitamin with minerals  1 tablet Oral Daily   pantoprazole  40 mg Oral BID   potassium chloride  40 mEq Oral Q4H   rosuvastatin  5 mg Oral QODAY   sodium chloride flush  3 mL Intravenous Q12H   Continuous Infusions:  sodium chloride Stopped (12/05/22 0612)   Imaging and lab data was personally reviewed No results found.  LOS: 2 days   Author: Calvert Cantor  12/06/2022 2:19 PM  To contact [facility]>   Check the care team  in [facility] and look for the attending/consulting [facility] provider listed  Log into [email or web address] and use [place]'s universal password   Go to> "[facility]"  and find provider  If you still have difficulty reaching the provider, please page the [facility] (Director on Call) for the Hospitalists listed  on amion

## 2022-12-07 ENCOUNTER — Inpatient Hospital Stay (HOSPITAL_COMMUNITY): Payer: BC Managed Care – PPO

## 2022-12-07 DIAGNOSIS — E871 Hypo-osmolality and hyponatremia: Secondary | ICD-10-CM

## 2022-12-07 DIAGNOSIS — J189 Pneumonia, unspecified organism: Secondary | ICD-10-CM

## 2022-12-07 DIAGNOSIS — Z79899 Other long term (current) drug therapy: Secondary | ICD-10-CM

## 2022-12-07 DIAGNOSIS — Z515 Encounter for palliative care: Secondary | ICD-10-CM

## 2022-12-07 DIAGNOSIS — Z66 Do not resuscitate: Secondary | ICD-10-CM

## 2022-12-07 DIAGNOSIS — J181 Lobar pneumonia, unspecified organism: Secondary | ICD-10-CM

## 2022-12-07 DIAGNOSIS — R0602 Shortness of breath: Secondary | ICD-10-CM

## 2022-12-07 DIAGNOSIS — Z7189 Other specified counseling: Secondary | ICD-10-CM

## 2022-12-07 LAB — BASIC METABOLIC PANEL
Anion gap: 10 (ref 5–15)
BUN: 16 mg/dL (ref 8–23)
CO2: 28 mmol/L (ref 22–32)
Calcium: 8.4 mg/dL — ABNORMAL LOW (ref 8.9–10.3)
Chloride: 90 mmol/L — ABNORMAL LOW (ref 98–111)
Creatinine, Ser: 1.33 mg/dL — ABNORMAL HIGH (ref 0.61–1.24)
GFR, Estimated: 59 mL/min — ABNORMAL LOW (ref >60)
Glucose, Bld: 99 mg/dL (ref 70–99)
Potassium: 2.5 mmol/L — CL (ref 3.5–5.1)
Sodium: 128 mmol/L — ABNORMAL LOW (ref 135–145)

## 2022-12-07 LAB — CBC
HCT: 28.7 % — ABNORMAL LOW (ref 39.0–52.0)
Hemoglobin: 9.4 g/dL — ABNORMAL LOW (ref 13.0–17.0)
MCH: 30.5 pg (ref 26.0–34.0)
MCHC: 32.8 g/dL (ref 30.0–36.0)
MCV: 93.2 fL (ref 80.0–100.0)
Platelets: 380 10*3/uL (ref 150–400)
RBC: 3.08 MIL/uL — ABNORMAL LOW (ref 4.22–5.81)
RDW: 16.1 % — ABNORMAL HIGH (ref 11.5–15.5)
WBC: 14.5 10*3/uL — ABNORMAL HIGH (ref 4.0–10.5)
nRBC: 0 % (ref 0.0–0.2)

## 2022-12-07 LAB — MAGNESIUM
Magnesium: 2.2 mg/dL (ref 1.7–2.4)
Magnesium: 2.2 mg/dL (ref 1.7–2.4)

## 2022-12-07 LAB — URINE CULTURE: Culture: NO GROWTH

## 2022-12-07 MED ORDER — SUCRALFATE 1 GM/10ML PO SUSP
1.0000 g | Freq: Three times a day (TID) | ORAL | Status: DC
  Administered 2022-12-07 – 2022-12-09 (×5): 1 g via ORAL
  Filled 2022-12-07 (×5): qty 10

## 2022-12-07 MED ORDER — POTASSIUM CHLORIDE CRYS ER 20 MEQ PO TBCR
40.0000 meq | EXTENDED_RELEASE_TABLET | ORAL | Status: AC
  Administered 2022-12-07 (×2): 40 meq via ORAL
  Filled 2022-12-07 (×2): qty 2

## 2022-12-07 MED ORDER — OXYCODONE HCL 5 MG PO TABS
5.0000 mg | ORAL_TABLET | ORAL | Status: DC | PRN
  Administered 2022-12-07 – 2022-12-08 (×5): 5 mg via ORAL
  Filled 2022-12-07 (×5): qty 1

## 2022-12-07 NOTE — Progress Notes (Signed)
SATURATION QUALIFICATIONS: (This note is used to comply with regulatory documentation for home oxygen)  Patient Saturations on Room Air at Rest = 94%  Patient Saturations on Room Air while Ambulating = 89%  Patient Saturations on 2 Liters of oxygen while Ambulating = 93%  Please briefly explain why patient needs home oxygen: Patient does not qualify for home oxygen.

## 2022-12-07 NOTE — Evaluation (Signed)
Clinical/Bedside Swallow Evaluation Patient Details  Name: Sean Rose MRN: [id number] Date of Birth: April 29, 1956  Today's Date: 12/07/2022 Time: SLP Start Time (ACUTE ONLY): 1240 SLP Stop Time (ACUTE ONLY): 1255 SLP Time Calculation (min) (ACUTE ONLY): 15 min  Past Medical History:  Past Medical History:  Diagnosis Date   Allergy    Anemia    Anginal pain (HCC)    Blood transfusion without reported diagnosis    CAD (coronary artery disease)    BMS to RCA 2007. Low risk nuclear stress 2020.   CAP (community acquired pneumonia) 09/2016   COPD (chronic obstructive pulmonary disease) (HCC)    Dyspnea    occasionally   Emphysema of lung (HCC)    GERD (gastroesophageal reflux disease)    Heart murmur    "I was told I had one when I was a kid"   Hemorrhoids    History of anal fissures    "no surgeries" (10/30/2016)   Hyperlipidemia    Hypertension    lung ca dx'd 08/2018   with mets to bones in arms   Myocardial infarction [facility])    Peripheral arterial disease (HCC)    status post right common iliac artery stenting back in 2007   Seasonal allergies    Tobacco abuse    Past Surgical History:  Past Surgical History:  Procedure Laterality Date   ANKLE SURGERY Left    "rebuilt it"   ANTERIOR CRUCIATE LIGAMENT REPAIR Right    CARDIAC CATHETERIZATION  09/23/2008   Continued medical therapy - may need GI evaluation in addition.   CARDIAC CATHETERIZATION  10/28/2007   Medical therapy recommended.   CARDIAC CATHETERIZATION  11/18/2006   In-stent restenosis RCA  (50% distal edge, 80% segmental mid, and 50-60% segmental proximal). Successful cutting balloon atherectomy using a [id number] cutting balloon. 3 inflations with atherectomy performed on mid and proximal portions resulting in reduction of 80% mid in-stent restenosis to less than 20% residual and 50-60% segmental proximal to less than 20% residual without dissection.   CARDIAC CATHETERIZATION  02/26/2006   Severe stenosis in RCA.  Stenting performed using IVUS. 3.5x20 Maverick balloon deployed at [employer]. Distal stent-a 4x28 Liberte stent-deployed 12atm 48sec, 12atm 31sec, 4atm 19sec. Mid stent-a 4x28 Liberte stent-deployed 14atm 45sec, 14atm 60sec, 14atm 44sec. Proximal stent-4x8 Liberte- 14atm 45sec,14atm 47sec, 16atm 43sec. Severely diseased segment then appeared TIMI-3 flow.   CARDIOVASCULAR STRESS TEST  11/17/2012   No significant ECG changes. Septal perfusion defect is new when complared to study from 2010. Abnormal myocardial perfusion imaging with a basal to mid perfusion suggestive of previous MI.   CAROTID DOPPLER  08/09/2011   Bilateral Bulb/Proximal ICA - demonstrated a mild amount of fibrous plaque without evidence of significant diameter reduction reduction or other vascular abnormality.   CHEST TUBE INSERTION Right 09/02/2018   Procedure: Chest Tube Insertion;  Surgeon: Josephine Igo, DO;  Location: [facility] OR;  Service: Thoracic;  Laterality: Right;   COLONOSCOPY     2003, 2014   CORONARY ANGIOPLASTY WITH STENT PLACEMENT     FEMORAL ARTERY STENT     INGUINAL HERNIA REPAIR Right    IR IMAGING GUIDED PORT INSERTION  11/13/2020   KNEE ARTHROSCOPY Right "multiple"   LAPAROSCOPIC APPENDECTOMY N/A 12/07/2018   Procedure: APPENDECTOMY LAPAROSCOPIC;  Surgeon: Abigail Miyamoto, MD;  Location: [facility] ORS;  Service: General;  Laterality: N/A;   LOWER EXTREMITY ARTERIAL DOPPLER  01/31/2011   Bilateral ABIs-normal values with no suggestion of arterial insuff to the lower extremities at  rest. 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 Right common iliac artery with an 8.0x18 mm Herculink stent deployed at 12 atm. Stenosis was reduced from 80% to 0% with brisk flow. b. I-cast stenting to left common iliac artery   PERIPHERAL VASCULAR CATHETERIZATION N/A 04/13/2015   Procedure:  Lower Extremity Angiography;  Surgeon: Runell Gess, MD; L-oCIA 75%, 40-50% L-EIA, R-CIA stent patent, s/p 8 mm x 38 mm ICast covered stent>>0% stenosis in L-oCIA      SHOULDER ARTHROSCOPY WITH ROTATOR CUFF REPAIR Right    TRANSTHORACIC ECHOCARDIOGRAM  11/26/2012   EF not noted. Aortic valve-sclerosis without stenosis, no regurgiation.    UPPER GASTROINTESTINAL ENDOSCOPY     US CAROTID DOPPLER BILATERAL ([facility] HX)  08/09/2011   Bilateral Bulb/Proximal ICAa demonstrated a mild amount of fibrous plaque without evidence of significant diameter reduction or any other vascular abnormality.   VIDEO ASSISTED THORACOSCOPY (VATS)/ LOBECTOMY Right 09/14/2018   Procedure: RIGHT VIDEO ASSISTED THORACOSCOPY (VATS)/ RIGHT UPPER LOBECTOMY;  Surgeon: Loreli Slot, MD;  Location: [facility] OR;  Service: Thoracic;  Laterality: Right;   VIDEO BRONCHOSCOPY WITH ENDOBRONCHIAL NAVIGATION N/A 09/02/2018   Procedure: VIDEO BRONCHOSCOPY WITH ENDOBRONCHIAL NAVIGATION;  Surgeon: Josephine Igo, DO;  Location: [facility] OR;  Service: Thoracic;  Laterality: N/A;   VIDEO BRONCHOSCOPY WITH INSERTION OF INTERBRONCHIAL VALVE (IBV) N/A 07/10/2022   Procedure: VIDEO BRONCHOSCOPY WITH INSERTION OF INTERBRONCHIAL VALVE (IBV);  Surgeon: Loreli Slot, MD;  [facility] OR;  Service: Thoracic;  Laterality: N/A;   VIDEO BRONCHOSCOPY WITH INSERTION OF INTERBRONCHIAL VALVE (IBV) N/A 09/06/2022   Procedure: VIDEO BRONCHOSCOPY WITH REMOVAL OF INTRABRONCHIAL VALVE (IBV);  Surgeon: Loreli Slot, MD;  Location: [facility] OR;  Service: Thoracic;  Laterality: N/A;   HPI:  67 year old male with metastatic lung cancer, currently not on treatment as he is no longer able to tolerate it, COPD stage IV, CAD with stenting, PAD with iliac stent, iron deficiency anemia who presents to the hospital for vomiting, diarrhea and dark stools. Additionally diagnosed with PNA.    Assessment / Plan / Recommendation  Clinical Impression  Patient presents with  normal appearing oropharyngeal swallowing ablities based on bedside assessment. Oral phase WFL for consumption of pureed solids. Patient declined regular texture solid due to missing bottom dentition stating that he consumes mostly soft foods at home prior to admission. Swallow appearing timely and no overt s/s of aspiration present. He does have risk factors for silent aspiration including chronic GER, esophageal stenosis, and COPD but at this time presents with no evidence of dysphagia and has not had recurrent PNAs. It is also possible that he aspirated during an episode of emesis given that it was described as copious and violent in nature to cause current PNA. Discussed possible diet downgrade wtih spouse given preference for soft foods however spouse stated that patinet is very particular and only will eat foods brought in from home. Patient is also cognitively intact and can pick softer options from menu if desired. No changes in diet desired at this time. Have sent secure chat to MD for direction on whether based on CXR, she feels that instrumental testing is indicated at this time. Otherwise, no SLP f/u would be indicated. SLP Visit Diagnosis: Dysphagia, unspecified (R13.10)    Aspiration Risk       Diet Recommendation Regular;Thin liquid  Liquid Administration via: Cup;Straw Medication Administration: Whole meds with liquid Supervision: Patient able to self feed Compensations: Slow rate;Small sips/bites Postural Changes: Seated upright at 90 degrees;Remain upright for at least 30 minutes after po intake    Other  Recommendations Oral Care Recommendations: Oral care BID    Recommendations for follow up therapy are one component of a multi-disciplinary discharge planning process, led by the attending physician.  Recommendations may be updated based on patient status, additional functional criteria and insurance authorization.  Follow up Recommendations No SLP follow up        Swallow  Study   General HPI: 67 year old male with metastatic lung cancer, currently not on treatment as he is no longer able to tolerate it, COPD stage IV, CAD with stenting, PAD with iliac stent, iron deficiency anemia who presents to the hospital for vomiting, diarrhea and dark stools. Additionally diagnosed with PNA. Type of Study: Bedside Swallow Evaluation Diet Prior to this Study: Regular;Thin liquids (Level 0) Temperature Spikes Noted: No Respiratory Status: Nasal cannula History of Recent Intubation: No Behavior/Cognition: Alert;Cooperative;Pleasant mood Oral Cavity Assessment: Within Functional Limits Oral Care Completed by SLP: No Oral Cavity - Dentition: Missing dentition;Poor condition (bottom) Vision: Functional for self-feeding Self-Feeding Abilities: Able to feed self Patient Positioning: Upright in bed Baseline Vocal Quality: Normal Volitional Cough: Strong Volitional Swallow: Able to elicit    Oral/Motor/Sensory Function Overall Oral Motor/Sensory Function: Within functional limits   Ice Chips Ice chips: Not tested   Thin Liquid Thin Liquid: Within functional limits Presentation: Straw;Self Fed    Nectar Thick Nectar Thick Liquid: Not tested   Honey Thick Honey Thick Liquid: Not tested   Puree Puree: Within functional limits Presentation: Self Fed;Spoon   Solid     Solid: Not tested (patient declined)     Ferdinand Lango MA, CCC-SLP  Sianni Cloninger Meryl 12/07/2022,1:06 PM

## 2022-12-07 NOTE — Progress Notes (Signed)
[facility] Progress Note  Patient: Sean Rose     ZOX:[id number]  DOA: 12/03/2022   PCP: [name], NP       Brief hospital course: This is a 67 year old male with metastatic lung cancer, currently not on treatment as he is no longer able to tolerate it, COPD stage IV, CAD with stenting, PAD with iliac stent, iron deficiency anemia who presents to the hospital for vomiting, diarrhea and dark stools.  Upon further discussion, he admits to having a very sensitive gag reflex and over the past 2 days he has been gagging and vomiting.  He also admits to a cough with thick sputum over the past few days.  He believes he has had melena.  In the ED:  Pulse ox 86% on room air-tachycardic with heart rates in the 90s and low 100s, respiratory rate in the 20s Fecal occult blood negative Sodium 128, WBC count 16, hemoglobin 11  CTA revealed new heterogeneous and groundglass opacities throughout the lung bases particularly left, moderate right and small left pleural effusions likely chronic and loculated, right upper lobe lobectomy, severe underlying emphysema and new wedge-shaped deformity of T7  CT abdomen pelvis revealed that the gastric fundus was filled with fluid, there was thickening of the gastric body and antrum similar to prior exam.  The patient was diagnosed with pneumonia and started on antibiotics. At IV Protonix infusion was started, aspirin Plavix was held and a GI consult was requested.   Subjective:  Urinating less frequently. Has no pain. Has been eating an ambulating ok.    He would like to go home and is not in full agreement with staying in the hospital any longer.  Assessment and Plan: Principal Problem:   Hematemesis/melena? - Currently has no complaints of either and stool Hemoccult was negative in ED - Appreciate GI evaluation- - Protonix has been changed to twice daily -  Hgb improved to 9.5 which seems to be near his baseline - tolerating regular  diet  Active Problems:  Pneumonia with acute hypoxic respiratory failure and tachycardia Leukocytosis - b/l pneumonia -  ceftriaxone transitioned to Cefdinir on 5/10- cont azithromycin - WBC count was 16.2 and improved to ~ 11 but then increased to 17.1 - ? Silent aspiration- SLP eval ordered     Hypokalemia and hypomagnesemia -Continue to replace - K 2.5 today    Hyponatremia - on sodium tabs at home - improved, he is drinking well and I have stopped IVF - hold off on resuming sodium tabs  Underweight Body mass index is 13.85 kg/m. -He states that he is unable to tolerate most dietary supplements - Continue dietary supplements as tolerated while in the hospital    Adenocarcinoma of right lung, stage 4 -Status post right upper lobectomy with lymph node dissection - Palliative radiotherapy to metastasis in the scapular and left supraclavicular area - Unable to tolerate chemotherapy after 12 cycles - Severe weight loss and fatigue as noted - Transitioned to DNR on 5/10 after speaking with patient and his wife together -  palliative consult requested on 5/10    AKI-  - Cr improved from 1.75 to normal- follow prn    GOC: code status discussed with patient and his wife. DNR has been decided upon. Patient and wife open to meeting with palliative care in the hospital.      Code Status: DNR Consultants: None Level of Care: Level of care: Med-Surg Total time on patient care: 35 minutes DVT prophylaxis:  SCDs  Start: 12/03/22 1611     Objective:   Vitals:   12/06/22 1334 12/06/22 2143 12/07/22 0524 12/07/22 0849  BP: 131/87 (!) 143/86 133/88   Pulse: 87 81 83   Resp: 20 14 14    Temp: 98.2 F (36.8 C) 97.9 F (36.6 C) (!) 97.5 F (36.4 C)   TempSrc: Oral Oral Oral   SpO2: 99% 99% 97% 98%  Weight:      Height:       Filed Weights   12/03/22 0902 12/03/22 1730  Weight: 40.8 kg 40.1 kg   Exam: General exam: Appears comfortable  HEENT: oral mucosa  moist Respiratory system: crackles in LLL- mild Cardiovascular system: S1 & S2 heard  Gastrointestinal system: Abdomen soft, non-tender, nondistended. Normal bowel sounds   Extremities: No cyanosis, clubbing or edema Psychiatry:  Mood & affect appropriate.     CBC: Recent Labs  Lab 12/03/22 0928 12/03/22 1846 12/04/22 0054 12/04/22 0746 12/04/22 1525 12/05/22 0001 12/05/22 0750 12/06/22 0943 12/07/22 0629  WBC 16.2*  --  11.9* 11.1*  --   --   --  17.1* 14.5*  NEUTROABS 14.3*  --   --   --   --   --   --   --   --   HGB 11.6*   < > 7.6* 8.1* 8.4* 7.8* 7.7* 9.5* 9.4*  HCT 34.5*   < > 22.6* 25.5* 26.1* 23.7* 24.2* 29.2* 28.7*  MCV 91.8  --  92.2 94.4  --   --   --  94.5 93.2  PLT 529*  --  350 373  --   --   --  401* 380   < > = values in this interval not displayed.    Basic Metabolic Panel: Recent Labs  Lab 12/03/22 0928 12/04/22 0054 12/06/22 0942 12/06/22 0943 12/07/22 0629  NA 128* 129*  --  130* 128*  K 3.1* 3.3*  --  2.4* 2.5*  CL 85* 97*  --  93* 90*  CO2 30 24  --  26 28  GLUCOSE 132* 132*  --  115* 99  BUN 50* 43*  --  14 16  CREATININE 1.75* 1.56*  --  1.23 1.33*  CALCIUM 9.6 8.4*  --  8.4* 8.4*  MG 2.5*  --  1.2*  --  2.2    GFR: Estimated Creatinine Clearance: 31 mL/min (A) (by C-G formula based on SCr of 1.33 mg/dL (H)).  Scheduled Meds:  azithromycin  500 mg Oral QAC supper   cefdinir  300 mg Oral Daily   Chlorhexidine Gluconate Cloth  6 each Topical Daily   feeding supplement (KATE FARMS STANDARD 1.4)  325 mL Oral BID BM   fluticasone furoate-vilanterol  1 puff Inhalation Daily   And   umeclidinium bromide  1 puff Inhalation Daily   magnesium oxide  400 mg Oral BID   mirtazapine  15 mg Oral QHS   multivitamin with minerals  1 tablet Oral Daily   pantoprazole  40 mg Oral BID   potassium chloride  40 mEq Oral Q4H   rosuvastatin  5 mg Oral QODAY   sodium chloride flush  3 mL Intravenous Q12H   Continuous Infusions:  sodium chloride  Stopped (12/05/22 0612)   Imaging and lab data was personally reviewed DG Chest Port 1 View  Result Date: 12/06/2022 CLINICAL DATA:  Lobar pneumonia, history of lung carcinoma EXAM: PORTABLE CHEST 1 VIEW COMPARISON:  12/03/2022 FINDINGS: Cardiac shadow is stable. Aortic calcifications are. Right jugular  chest wall port is seen. Small effusions are again noted bilaterally and stable. Stable airspace opacities in the left base are noted. Mild interstitial changes are seen without acute abnormality. IMPRESSION: No change from the prior exam. Electronically Signed   By: Alcide Clever M.D.   On: 12/06/2022 20:03    LOS: 3 days   Author: Calvert Cantor  12/07/2022 11:51 AM  To contact [facility]>   Check the care team in [facility] and look for the attending/consulting [facility] provider listed  Log into [email or web address] and use [facility]'s universal password   Go to> "[facility]"  and find provider  If you still have difficulty reaching the provider, please page the [facility] (Director on Call) for the Hospitalists listed on amion

## 2022-12-07 NOTE — Consult Note (Signed)
Consultation Note Date: 12/07/2022   Patient Name: Sean Rose  DOB: [birth year]  MRN: [id number]  Age / Sex: 67 y.o., male   PCP: [name], NP Referring Physician: Calvert Cantor, MD  Reason for Consultation: Establishing goals of care     Chief Complaint/History of Present Illness:   Patient is a 67 year old male with a past medical history of metastatic lung cancer (currently not on chemotherapy is unable to tolerate, stage IV COPD, CAD with PCI, PAD with iliac stent, and  multiple falls who was admitted on 12/03/2022 for management of vomiting, diarrhea, and dark schools.  Since admission, he was found to be stool Hemoccult negative; GI was consulted for recommendations.  Patient also receiving management for pneumonia with acute hypoxic respiratory failure and tachycardia.  Patient also receiving management for electrolyte imbalance in setting of severe malnutrition.  Palliative medicine consulted to assist with complex medical decision making.  Extensive review of EMR prior to seeing patient.  Patient has been receiving oxycodone 10 mg 3 times daily as needed for management of pain.  Has had stable lung disease though imaging shows progressive underlying emphysema as expected.  Gust care with bedside RN prior to presenting to bedside.  SLP had just been in to evaluate patient.  Presented to bedside to meet with patient.  Since wife, Mckinnon, present at bedside.  Patient gave permission for conversation while wife present.  Introduced myself and the role of the palliative medicine team.  Spent extensive time learning about patient's medical journey up into this point.  Able to discuss patient's goals for medical care moving forward.  Patient wants to optimize time at home where he has the most joy.  Patient does not want to be coming back to the hospital.  Patient knows he is not a candidate for further systemic cancer directed therapies.  Based on this, able to discuss possible pathways  for medical care moving forward including continue with aggressive medical management versus transition to comfort focused care.    With patient's stated goals, presented idea of hospice.  Patient has severe underlying lung disease beyond his stage IV cancer.  Explained to patient and his wife that he is cachectic in the setting of severe COPD.  Discussed philosophy of hospice and services it would and would not entail.  With patient's goal of getting home and staying home, discussed home hospice to optimize quality time there.  Again emphasized focus also on quality time, not quantity of time.  Spent extensive time during questions as able to related to this.  Patient and family live here and request to discuss further with [facility] or care hospice liaison.  Noted would reach out to liaison to assist with coordination to get further details to answer their questions regarding specifics of hospice.  At this time, patient willing to continue with current medical therapies including electrolyte repletion and management of his antibiotics for leukocytosis.  Patient had eval by SLP today and noted he does not want to undergo a modified barium swallow.  Discussed this and noted based on conversation would not change patient's overall management anyway so able to discuss not pursuing swallow study at this time as patient does not want to.  Able to gust patient's symptoms as well.  Noted going to start additional medication for heartburn management.  Also discussed adjusting oxycodone dose to 5 mg every 4 hours to assist with dyspnea management, not only pain.  Patient will monitor his response to dyspnea to determine if  further adjustments need to be made.  Provided emotional support via active listening.  All questions answered at that time.  Thanked patient for allowing me to meet with him and his wife today.  Noted palliative medicine team continue to follow with patient's journey.  Updated care team  including [facility] liaison after discussion with patient.  Primary Diagnoses  Present on Admission:  Hematemesis  Adenocarcinoma of right lung, stage 4 (HCC)  CKD (chronic kidney disease) stage 3, GFR 30-59 ml/min (HCC)  Hypokalemia  Hyponatremia  Nausea vomiting and diarrhea  Lobar pneumonia (HCC)  Chronic obstructive pulmonary disease (HCC)   Palliative Review of Systems: GERD, dyspnea  Past Medical History:  Diagnosis Date   Allergy    Anemia    Anginal pain (HCC)    Blood transfusion without reported diagnosis    CAD (coronary artery disease)    BMS to RCA 2007. Low risk nuclear stress 2020.   CAP (community acquired pneumonia) 09/2016   COPD (chronic obstructive pulmonary disease) (HCC)    Dyspnea    occasionally   Emphysema of lung (HCC)    GERD (gastroesophageal reflux disease)    Heart murmur    "I was told I had one when I was a kid"   Hemorrhoids    History of anal fissures    "no surgeries" (10/30/2016)   Hyperlipidemia    Hypertension    lung ca dx'd 08/2018   with mets to bones in arms   Myocardial infarction [facility])    Peripheral arterial disease (HCC)    status post right common iliac artery stenting back in 2007   Seasonal allergies    Tobacco abuse    Social History   Socioeconomic History   Marital status: Married    Spouse name: Not on file   Number of children: 2   Years of education: Not on file   Highest education level: Not on file  Occupational History   Occupation: retired  Tobacco Use   Smoking status: Former    Packs/day: 0.25    Years: 38.00    Additional pack years: 0.00    Total pack years: 9.50    Types: Cigarettes    Quit date: 10/19/2018    Years since quitting: 4.1   Smokeless tobacco: Never   Tobacco comments:    pack per day 12.17.19  Vaping Use   Vaping Use: Former  Substance and Sexual Activity   Alcohol use: Not Currently    Comment: socially   Drug use: No   Sexual activity: Yes  Other Topics Concern   Not on  file  Social History Narrative   Patient is married with 2 children   Former smoker no tobacco now no drug use occasional/social alcohol   Social Determinants of Corporate investment banker Strain: Not on file  Food Insecurity: No Food Insecurity (07/04/2022)   Hunger Vital Sign    Worried About Running Out of Food in the Last Year: Never true    Ran Out of Food in the Last Year: Never true  Transportation Needs: No Transportation Needs (07/04/2022)   PRAPARE - Administrator, Civil Service (Medical): No    Lack of Transportation (Non-Medical): No  Physical Activity: Not on file  Stress: Not on file  Social Connections: Not on file   Family History  Problem Relation Age of Onset   Colon cancer Mother    Heart disease Father    Heart disease Paternal Grandfather  Rectal cancer Other    Esophageal cancer Neg Hx    Stomach cancer Neg Hx    Scheduled Meds:  azithromycin  500 mg Oral QAC supper   cefdinir  300 mg Oral Daily   Chlorhexidine Gluconate Cloth  6 each Topical Daily   feeding supplement (KATE FARMS STANDARD 1.4)  325 mL Oral BID BM   fluticasone furoate-vilanterol  1 puff Inhalation Daily   And   umeclidinium bromide  1 puff Inhalation Daily   magnesium oxide  400 mg Oral BID   mirtazapine  15 mg Oral QHS   pantoprazole  40 mg Oral BID   potassium chloride  40 mEq Oral Q4H   sodium chloride flush  3 mL Intravenous Q12H   sucralfate  1 g Oral TID WC & HS   Continuous Infusions:  sodium chloride Stopped (12/05/22 0612)   PRN Meds:.sodium chloride, acetaminophen **OR** acetaminophen, ondansetron **OR** ondansetron (ZOFRAN) IV, mouth rinse, oxyCODONE, sodium chloride flush Allergies  Allergen Reactions   Compazine [Prochlorperazine] Other (See Comments)    " made him high and could not sleep". He does not want to take it again.   CBC:    Component Value Date/Time   WBC 14.5 (H) 12/07/2022 0629   HGB 9.4 (L) 12/07/2022 0629   HGB 9.7 (L)  09/30/2022 1452   HCT 28.7 (L) 12/07/2022 0629   PLT 380 12/07/2022 0629   PLT 549 (H) 09/30/2022 1452   MCV 93.2 12/07/2022 0629   NEUTROABS 14.3 (H) 12/03/2022 0928   LYMPHSABS 1.2 12/03/2022 0928   MONOABS 0.7 12/03/2022 0928   EOSABS 0.0 12/03/2022 0928   BASOSABS 0.0 12/03/2022 0928   Comprehensive Metabolic Panel:    Component Value Date/Time   NA 128 (L) 12/07/2022 0629   NA 130 (L) 03/05/2017 1110   K 2.5 (LL) 12/07/2022 0629   CL 90 (L) 12/07/2022 0629   CO2 28 12/07/2022 0629   BUN 16 12/07/2022 0629   BUN 4 (L) 03/05/2017 1110   CREATININE 1.33 (H) 12/07/2022 0629   CREATININE 1.68 (H) 09/30/2022 1452   CREATININE 0.76 04/28/2020 0826   GLUCOSE 99 12/07/2022 0629   CALCIUM 8.4 (L) 12/07/2022 0629   AST 14 (L) 12/04/2022 0054   AST 14 (L) 09/30/2022 1452   ALT 12 12/04/2022 0054   ALT 9 09/30/2022 1452   ALKPHOS 46 12/04/2022 0054   BILITOT 0.2 (L) 12/04/2022 0054   BILITOT 0.3 09/30/2022 1452   PROT 5.3 (L) 12/04/2022 0054   PROT 7.7 08/25/2020 1201   ALBUMIN 2.5 (L) 12/04/2022 0054   ALBUMIN 4.6 08/25/2020 1201    Physical Exam: Vital Signs: BP (!) 143/96 (BP Location: Right Arm)   Pulse 89   Temp 98.7 F (37.1 C) (Oral)   Resp 20   Ht 5\' 7"  (1.702 m)   Wt 40.1 kg   SpO2 92%   BMI 13.85 kg/m  SpO2: SpO2: 92 % O2 Device: O2 Device: Room Air O2 Flow Rate: O2 Flow Rate (L/min): 2 L/min Intake/output summary:  Intake/Output Summary (Last 24 hours) at 12/07/2022 1451 Last data filed at 12/07/2022 1610 Gross per 24 hour  Intake 100 ml  Output 800 ml  Net -700 ml   LBM: Last BM Date : 12/03/22 Baseline Weight: Weight: 40.8 kg Most recent weight: Weight: 40.1 kg  General: NAD, alert, laying in bed, cachectic, frail Eyes: conjunctiva clear, anicteric sclera HENT: Poor dentition, moist mucous membranes Cardiovascular: RRR Respiratory: not in respiratory distress Abdomen: not distended  Extremities: Muscle wasting present in all extremities Neuro:  A&Ox4, following commands easily Psych: appropriately answers all questions          Palliative Performance Scale: 40%              Additional Data Reviewed: Recent Labs    12/06/22 0943 12/07/22 0629  WBC 17.1* 14.5*  HGB 9.5* 9.4*  PLT 401* 380  NA 130* 128*  BUN 14 16  CREATININE 1.23 1.33*    Imaging: DG Chest Port 1 View CLINICAL DATA:  Lobar pneumonia, history of lung carcinoma  EXAM: PORTABLE CHEST 1 VIEW  COMPARISON:  12/03/2022  FINDINGS: Cardiac shadow is stable. Aortic calcifications are. Right jugular chest wall port is seen. Small effusions are again noted bilaterally and stable. Stable airspace opacities in the left base are noted. Mild interstitial changes are seen without acute abnormality.  IMPRESSION: No change from the prior exam.  Electronically Signed   By: Alcide Clever M.D.   On: 12/06/2022 20:03    I personally reviewed recent imaging.   Palliative Care Assessment and Plan Summary of Established Goals of Care and Medical Treatment Preferences   Patient is a 67 year old male with a past medical history of metastatic lung cancer (currently not on chemotherapy is unable to tolerate, stage IV COPD, CAD with PCI, PAD with iliac stent, and  multiple falls who was admitted on 12/03/2022 for management of vomiting, diarrhea, and dark schools.  Since admission, he was found to be stool Hemoccult negative; GI was consulted for recommendations.  Patient also receiving management for pneumonia with acute hypoxic respiratory failure and tachycardia.  Patient also receiving management for electrolyte imbalance in setting of severe malnutrition.  Palliative medicine consulted to assist with complex medical decision making.  # Complex medical decision making/goals of care  -Extensive discussion with patient and his wife as described above and HPI.  Patient and wife considering going home with hospice through [facility] or care.  Patient of wife wanting to  discuss hospice specifics with [facility] or care liaison, have reached out to help coordinate this.  Patient willing to continue with current level of medical interventions such as managing his leukocytosis with antibiotics and replacing his electrolytes.  Patient does not want to pursue further aggressive workup including modified barium swallow.  Patient understands the risk of this.  -  Code Status: DNR  Prognosis: < 6 months  # Symptom management  -Dyspnea, in setting of end-stage COPD   - Start Oxycodone 5 mg every 4 hours as needed   -GERD   -Continue Protonix twice daily   -Start Carafate 1 g 3 times daily  # Psycho-social/Spiritual Support:  - Support System: Wife  # Discharge Planning: Currently determining if patient wants to go home with [facility] or care hospice  Thank you for allowing the palliative care team to participate in the care [name].  Alvester Morin, DO Palliative Care Provider PMT # [phone]  If patient remains symptomatic despite maximum doses, please call PMT at [phone] between 0700 and 1900. Outside of these hours, please call attending, as PMT does not have night coverage.  This provider spent a total of 80 minutes providing patient's care.  Includes review of EMR, discussing care with other staff members involved in patient's medical care, obtaining relevant history and information from patient and/or patient's family, and personal review of imaging and lab work. Greater than 50% of the time was spent counseling and coordinating care related to the above assessment  and plan.    *Please note that this is a verbal dictation therefore any spelling or grammatical errors are due to the "Dragon [employer] One" system interpretation.

## 2022-12-07 NOTE — Plan of Care (Signed)
  Problem: Clinical Measurements: Goal: Ability to maintain clinical measurements within normal limits will improve Outcome: Progressing Goal: Will remain free from infection Outcome: Progressing Goal: Diagnostic test results will improve Outcome: Progressing Goal: Respiratory complications will improve Outcome: Progressing Goal: Cardiovascular complication will be avoided Outcome: Progressing   Problem: Activity: Goal: Risk for activity intolerance will decrease Outcome: Progressing   Problem: Nutrition: Goal: Adequate nutrition will be maintained Outcome: Progressing   Problem: Coping: Goal: Level of anxiety will decrease Outcome: Progressing   Problem: Pain Managment: Goal: General experience of comfort will improve Outcome: Progressing   Problem: Safety: Goal: Ability to remain free from injury will improve Outcome: Progressing   Problem: Skin Integrity: Goal: Risk for impaired skin integrity will decrease Outcome: Progressing   

## 2022-12-07 NOTE — Progress Notes (Signed)
SLP Cancellation Note  Patient Details Name: Sean Rose MRN: [id number] DOB: 18-Oct-1955   Cancelled treatment:       Reason Eval/Treat Not Completed: Other (comment). Planned for MBS this afternoon after receiving message from Dr. Butler Denmark to proceed with instrumental exam. Came to room to assist in transport to MBS but stopped by RN and Alvester Morin DO. Per Dr. Patterson Hammersmith, who just had a conversation with patient and spouse, they would like to hold off on MBS at this time as it is unlikely that it would change outcome for patient. Will cancel MBS. Plan is to have more discussions with palliative medicine. Advised to f/u on Monday 5/13. If MBS if wanted at that time, we can reschedule. If not, patient is not presenting with any outward signs of dysphagia and is largely independent with any swallowing precautions so no further SLP needs would be indicated.   Ferdinand Lango MA, CCC-SLP    Sean Rose 12/07/2022, 2:14 PM

## 2022-12-07 NOTE — Progress Notes (Addendum)
Civil engineer, contracting [facility]) Hospital Liaison Note   Received request from PMT provider, Dr. Marin Comment (Transitions of Care Manager, Katrice, aware) to meet with patient at bedside to explain hospice services at home.   Information scheduled for 5.11 @ 10 am.   [facility] information and contact numbers given to family & above information shared with TOC.   Please call with any questions/concerns.    Thank you for the opportunity to participate in this patient's care.   Eugenie Birks, MSW [facility] Liaison

## 2022-12-08 DIAGNOSIS — J189 Pneumonia, unspecified organism: Secondary | ICD-10-CM | POA: Diagnosis not present

## 2022-12-08 LAB — BASIC METABOLIC PANEL
Anion gap: 12 (ref 5–15)
Anion gap: 10 (ref 5–15)
BUN: 18 mg/dL (ref 8–23)
BUN: 20 mg/dL (ref 8–23)
CO2: 26 mmol/L (ref 22–32)
CO2: 25 mmol/L (ref 22–32)
Calcium: 8.7 mg/dL — ABNORMAL LOW (ref 8.9–10.3)
Calcium: 8.5 mg/dL — ABNORMAL LOW (ref 8.9–10.3)
Chloride: 90 mmol/L — ABNORMAL LOW (ref 98–111)
Chloride: 92 mmol/L — ABNORMAL LOW (ref 98–111)
Creatinine, Ser: 1.56 mg/dL — ABNORMAL HIGH (ref 0.61–1.24)
Creatinine, Ser: 1.48 mg/dL — ABNORMAL HIGH (ref 0.61–1.24)
GFR, Estimated: 49 mL/min — ABNORMAL LOW (ref >60)
GFR, Estimated: 52 mL/min — ABNORMAL LOW (ref >60)
Glucose, Bld: 80 mg/dL (ref 70–99)
Glucose, Bld: 65 mg/dL — ABNORMAL LOW (ref 70–99)
Potassium: 2.7 mmol/L — CL (ref 3.5–5.1)
Potassium: 4.5 mmol/L (ref 3.5–5.1)
Sodium: 128 mmol/L — ABNORMAL LOW (ref 135–145)
Sodium: 127 mmol/L — ABNORMAL LOW (ref 135–145)

## 2022-12-08 LAB — CBC
HCT: 28.4 % — ABNORMAL LOW (ref 39.0–52.0)
Hemoglobin: 9.5 g/dL — ABNORMAL LOW (ref 13.0–17.0)
MCH: 30.9 pg (ref 26.0–34.0)
MCHC: 33.5 g/dL (ref 30.0–36.0)
MCV: 92.5 fL (ref 80.0–100.0)
Platelets: 391 10*3/uL (ref 150–400)
RBC: 3.07 MIL/uL — ABNORMAL LOW (ref 4.22–5.81)
RDW: 16 % — ABNORMAL HIGH (ref 11.5–15.5)
WBC: 14.1 10*3/uL — ABNORMAL HIGH (ref 4.0–10.5)
nRBC: 0 % (ref 0.0–0.2)

## 2022-12-08 MED ORDER — HEPARIN SOD (PORK) LOCK FLUSH 100 UNIT/ML IV SOLN
500.0000 [IU] | Freq: Once | INTRAVENOUS | Status: AC
  Administered 2022-12-09: 500 [IU] via INTRAVENOUS
  Filled 2022-12-08: qty 5

## 2022-12-08 MED ORDER — POTASSIUM CHLORIDE 10 MEQ/100ML IV SOLN
10.0000 meq | INTRAVENOUS | Status: AC
  Administered 2022-12-08 (×4): 10 meq via INTRAVENOUS
  Filled 2022-12-08 (×4): qty 100

## 2022-12-08 MED ORDER — POTASSIUM CHLORIDE 20 MEQ PO PACK
60.0000 meq | PACK | Freq: Once | ORAL | Status: AC
  Administered 2022-12-08: 60 meq via ORAL
  Filled 2022-12-08: qty 3

## 2022-12-08 MED ORDER — POTASSIUM CHLORIDE 20 MEQ PO PACK: 40.0000 meq | PACK | ORAL | Status: DC

## 2022-12-08 MED ORDER — POTASSIUM CHLORIDE CRYS ER 20 MEQ PO TBCR: 40.0000 meq | EXTENDED_RELEASE_TABLET | ORAL | Status: DC

## 2022-12-08 MED ORDER — SODIUM CHLORIDE 0.9 % IV SOLN
2.0000 g | INTRAVENOUS | Status: DC
  Administered 2022-12-08: 2 g via INTRAVENOUS
  Filled 2022-12-08: qty 20

## 2022-12-08 NOTE — Progress Notes (Addendum)
[facility] Civil engineer, contracting [facility]) Hospital Liaison Note   MSW met patient/family at bedside and provided extensive education for hospice services.  Patent/family requested additional time to consider options before proceeding. All questions answered and no concerns voiced.  At this time, patient is not under review for [facility] services as family continues to have ongoing GOC discussions.    [facility] information and contact numbers given to family & above information shared with TOC.   Please call with any questions/concerns.    Thank you for the opportunity to participate in this patient's care.   Eugenie Birks, MSW [facility] Liaison

## 2022-12-08 NOTE — Progress Notes (Signed)
Daily Progress Note   Patient Name: Sean Rose       Date: 12/08/2022 DOB: [birth year]  Age: 67 y.o. MRN#: [id number] Attending Physician: Calvert Cantor, MD Primary Care Physician: [name], NP Admit Date: 12/03/2022 Length of Stay: 4 days  Reason for Consultation/Follow-up: Establishing goals of care  Subjective:   CC: Patient noting wanting to get home as soon as possible. Following up regarding complex medical decision making.  Subjective:  Reviewed EMR prior to presenting to bedside.  Patient had received oxycodone 5 mg x 3 doses in the past 24 hours at time of EMR review.  Patient's creatinine increased to 1.56, sodium 128, and potassium 2.7.  Will to discuss care with bedside RN and [facility] liaison prior to presenting to bedside.  Able to visit with patient at bedside.  Patient's wife present for conversation as well.  When following up on patient's increased work of breathing, patient unsure [facility] oxycodone has improved symptoms or not.  Without being able to give specific details, noted would continue current medication dosing and patient should monitor today to determine if it is assist staying with his dyspnea management.  Plan to go home as soon as possible.  Patient and wife note they had discussion with hospitalist today about hopefully improving patient's potassium so he can be discharged.  Again reviewed that patient's underlying medical conditions are chronic and will continue to worsen over time in setting of metastatic cancer, COPD, and severe protein calorie malnutrition with minimal oral intake.  Patient voiced appreciation for conversation with [facility] liaison.  Patient and his wife still want to discuss with their insurance provider if palliative and/or hospice care will be covered.  Agreed with the supporting medicine patient should pursue care that he feels most aligns with him and his quality of life.  Spent time providing emotional support via active listening.   Answered all questions of able at that time.  Thanked patient and his wife for allowing me to visit with him today.  Objective:   Vital Signs:  BP (!) 146/91 (BP Location: Right Arm)   Pulse 83   Temp 98 F (36.7 C) (Oral)   Resp 18   Ht 5\' 7"  (1.702 m)   Wt 40.1 kg   SpO2 99%   BMI 13.85 kg/m   Physical Exam: General: NAD, alert, laying in bed, cachectic, frail Eyes: conjunctiva clear, anicteric sclera HENT: Poor dentition, moist mucous membranes Cardiovascular: RRR Respiratory: not in respiratory distress Abdomen: not distended Extremities: Muscle wasting present in all extremities Neuro: A&Ox4, following commands easily Psych: anxious  Imaging:  I personally reviewed recent imaging.   Assessment & Plan:   Assessment: Patient is a 67 year old male with a past medical history of metastatic lung cancer (currently not on chemotherapy is unable to tolerate, stage IV COPD, CAD with PCI, PAD with iliac stent, and  multiple falls who was admitted on 12/03/2022 for management of vomiting, diarrhea, and dark schools.  Since admission, he was found to be stool Hemoccult negative; GI was consulted for recommendations.  Patient also receiving management for pneumonia with acute hypoxic respiratory failure and tachycardia.  Patient also receiving management for electrolyte imbalance in setting of severe malnutrition.  Palliative medicine consulted to assist with complex medical decision making.   Recommendations/Plan: # Complex medical decision making/goals of care:   -Patient wanting to go home as soon as possible.  Patient and wife still considering home with [facility] hospice versus palliative.  Noted they wanted to discuss  with patient's insurance about coverage for these.  Noted home palliative or home hospice could be initiated at a later time.  Patient should continue to receive care that aligns with his quality of life.                -  Code Status: DNR  Prognosis: < 6 months   #  Symptom management                -Dyspnea, in setting of end-stage COPD                               -Continue Oxycodone 5 mg every 4 hours as needed                  -GERD                               -Continue Protonix twice daily                               -Continue Carafate 1 g 3 times daily   # Psycho-social/Spiritual Support:  - Support System: Wife  # Discharge Planning: home, with/without home palliative or hospice  Discussed with: patient, wife, RN, [facility] liaison   Thank you for allowing the palliative care team to participate in the care [name].  Alvester Morin, DO Palliative Care Provider PMT # [phone]  If patient remains symptomatic despite maximum doses, please call PMT at [phone] between 0700 and 1900. Outside of these hours, please call attending, as PMT does not have night coverage.  This provider spent a total of 36 minutes providing patient's care.  Includes review of EMR, discussing care with other staff members involved in patient's medical care, obtaining relevant history and information from patient and/or patient's family, and personal review of imaging and lab work. Greater than 50% of the time was spent counseling and coordinating care related to the above assessment and plan.    *Please note that this is a verbal dictation therefore any spelling or grammatical errors are due to the "[employer] One" system interpretation.

## 2022-12-08 NOTE — Progress Notes (Signed)
Extensive conversation with patient and his wife again today. He would like to go home with palliative services. This patient is adamant about going home. I did ask for an ambulatory pulse ox screen for home O2 yesterday and I have yet to receive a result.

## 2022-12-08 NOTE — Progress Notes (Signed)
[facility] Progress Note  Patient: Sean Rose     ZOX:[id number]  DOA: 12/03/2022   PCP: [name], NP       Brief hospital course: This is a 67 year old male with metastatic lung cancer, currently not on treatment as he is no longer able to tolerate it, COPD stage IV, CAD with stenting, PAD with iliac stent, iron deficiency anemia who presents to the hospital for vomiting, diarrhea and dark stools.  Upon further discussion, he admits to having a very sensitive gag reflex and over the past 2 days he has been gagging and vomiting.  He also admits to a cough with thick sputum over the past few days.  He believes he has had melena.  In the ED:  Pulse ox 86% on room air-tachycardic with heart rates in the 90s and low 100s, respiratory rate in the 20s Fecal occult blood negative Sodium 128, WBC count 16, hemoglobin 11  CTA revealed new heterogeneous and groundglass opacities throughout the lung bases particularly left, moderate right and small left pleural effusions likely chronic and loculated, right upper lobe lobectomy, severe underlying emphysema and new wedge-shaped deformity of T7  CT abdomen pelvis revealed that the gastric fundus was filled with fluid, there was thickening of the gastric body and antrum similar to prior exam.  The patient was diagnosed with pneumonia and started on antibiotics. At IV Protonix infusion was started, aspirin Plavix was held and a GI consult was requested.   Subjective:  He is adamant about being discharged again today. He states he is doing well and has no complaints.  Assessment and Plan: Principal Problem:   Hematemesis/melena? - Currently has no complaints of either and stool Hemoccult was negative in ED - Appreciate GI evaluation- - Protonix has been changed to twice daily -  Hgb improved to 9.5 which seems to be near his baseline - tolerating regular diet  Active Problems:  Pneumonia with acute hypoxic respiratory failure  and tachycardia Leukocytosis - b/l pneumonia -  ceftriaxone transitioned to Cefdinir on 5/10- cont azithromycin - WBC count was 16.2 and improved to ~ 11 but then increased to 17.1- now ~ 4 - very frail and may be aspiration- ? Silent aspiration- SLP eval ordered but he has declined this - transition back to Ceftriaxone today - asked for ambulatory pulse ox      Hypokalemia and hypomagnesemia -Continue to replace - K 2.7 today - Mg normalized    Hyponatremia - on sodium tabs at home - improved, he is drinking well and I have stopped IVF - hold off on resuming sodium tabs  Underweight Body mass index is 13.85 kg/m. - He states that he is unable to tolerate most dietary supplements - Continue dietary supplements as tolerated while in the hospital    Adenocarcinoma of right lung, stage 4 Cachexia related to cancer -Status post right upper lobectomy with lymph node dissection - Palliative radiotherapy to metastasis in the scapular and left supraclavicular area - Unable to tolerate chemotherapy after 12 cycles - Severe weight loss and fatigue as noted - Transitioned to DNR on 5/10 after speaking with patient and his wife together -  palliative consult requested on 5/10- he is wanting palliative care at home and has declined hospice    AKI-  - Cr improved from 1.75 to normal- follow prn    GOC: code status discussed with patient and his wife. DNR has been decided upon.        Code Status: DNR  Consultants: None Level of Care: Level of care: Med-Surg Total time on patient care: 45 minutes DVT prophylaxis:  SCDs Start: 12/03/22 1611     Objective:   Vitals:   12/07/22 1408 12/07/22 2039 12/07/22 2043 12/08/22 0427  BP: (!) 143/96 (!) 141/100 121/89 (!) 146/91  Pulse: 89 91 88 83  Resp: 20 18  18   Temp: 98.7 F (37.1 C) 98.5 F (36.9 C)  98 F (36.7 C)  TempSrc: Oral Oral  Oral  SpO2: 92% 96%  99%  Weight:      Height:       Filed Weights   12/03/22 0902  12/03/22 1730  Weight: 40.8 kg 40.1 kg   Exam: General exam: Appears agitated HEENT: oral mucosa moist Respiratory system:  poor breath sounds but still has crackles in LLL Cardiovascular system: S1 & S2 heard -  Gastrointestinal system: Abdomen soft, non-tender, nondistended. Normal bowel sounds   Extremities: No cyanosis, clubbing or edema   CBC: Recent Labs  Lab 12/03/22 0928 12/03/22 1846 12/04/22 0054 12/04/22 0746 12/04/22 1525 12/05/22 0001 12/05/22 0750 12/06/22 0943 12/07/22 0629 12/08/22 0545  WBC 16.2*  --  11.9* 11.1*  --   --   --  17.1* 14.5* 14.1*  NEUTROABS 14.3*  --   --   --   --   --   --   --   --   --   HGB 11.6*   < > 7.6* 8.1*   < > 7.8* 7.7* 9.5* 9.4* 9.5*  HCT 34.5*   < > 22.6* 25.5*   < > 23.7* 24.2* 29.2* 28.7* 28.4*  MCV 91.8  --  92.2 94.4  --   --   --  94.5 93.2 92.5  PLT 529*  --  350 373  --   --   --  401* 380 391   < > = values in this interval not displayed.    Basic Metabolic Panel: Recent Labs  Lab 12/03/22 0928 12/04/22 0054 12/06/22 0942 12/06/22 0943 12/07/22 0627 12/07/22 0629 12/08/22 0545  NA 128* 129*  --  130*  --  128* 128*  K 3.1* 3.3*  --  2.4*  --  2.5* 2.7*  CL 85* 97*  --  93*  --  90* 90*  CO2 30 24  --  26  --  28 26  GLUCOSE 132* 132*  --  115*  --  99 80  BUN 50* 43*  --  14  --  16 18  CREATININE 1.75* 1.56*  --  1.23  --  1.33* 1.56*  CALCIUM 9.6 8.4*  --  8.4*  --  8.4* 8.7*  MG 2.5*  --  1.2*  --  2.2 2.2  --     GFR: Estimated Creatinine Clearance: 26.4 mL/min (A) (by C-G formula based on SCr of 1.56 mg/dL (H)).  Scheduled Meds:  Chlorhexidine Gluconate Cloth  6 each Topical Daily   feeding supplement (KATE FARMS STANDARD 1.4)  325 mL Oral BID BM   fluticasone furoate-vilanterol  1 puff Inhalation Daily   And   umeclidinium bromide  1 puff Inhalation Daily   magnesium oxide  400 mg Oral BID   mirtazapine  15 mg Oral QHS   pantoprazole  40 mg Oral BID   sodium chloride flush  3 mL  Intravenous Q12H   sucralfate  1 g Oral TID WC & HS   Continuous Infusions:  sodium chloride Stopped (12/05/22 0612)   cefTRIAXone (  ROCEPHIN)  IV     potassium chloride     Imaging and lab data was personally reviewed DG Chest Port 1 View  Result Date: 12/06/2022 CLINICAL DATA:  Lobar pneumonia, history of lung carcinoma EXAM: PORTABLE CHEST 1 VIEW COMPARISON:  12/03/2022 FINDINGS: Cardiac shadow is stable. Aortic calcifications are. Right jugular chest wall port is seen. Small effusions are again noted bilaterally and stable. Stable airspace opacities in the left base are noted. Mild interstitial changes are seen without acute abnormality. IMPRESSION: No change from the prior exam. Electronically Signed   By: Alcide Clever M.D.   On: 12/06/2022 20:03    LOS: 4 days   Author: Calvert Cantor  12/08/2022 10:31 AM  To contact [facility]>   Check the care team in [facility] and look for the attending/consulting [facility] provider listed  Log into [email or web address] and use [place]'s universal password   Go to> "[facility]"  and find provider  If you still have difficulty reaching the provider, please page the [facility] (Director on Call) for the Hospitalists listed on amion

## 2022-12-09 DIAGNOSIS — K920 Hematemesis: Secondary | ICD-10-CM | POA: Diagnosis not present

## 2022-12-09 LAB — BASIC METABOLIC PANEL
Anion gap: 12 (ref 5–15)
BUN: 20 mg/dL (ref 8–23)
CO2: 24 mmol/L (ref 22–32)
Calcium: 9.1 mg/dL (ref 8.9–10.3)
Chloride: 92 mmol/L — ABNORMAL LOW (ref 98–111)
Creatinine, Ser: 1.55 mg/dL — ABNORMAL HIGH (ref 0.61–1.24)
GFR, Estimated: 49 mL/min — ABNORMAL LOW (ref >60)
Glucose, Bld: 80 mg/dL (ref 70–99)
Potassium: 3.9 mmol/L (ref 3.5–5.1)
Sodium: 128 mmol/L — ABNORMAL LOW (ref 135–145)

## 2022-12-09 MED ORDER — CEFUROXIME AXETIL 500 MG PO TABS: 500.0000 mg | ORAL_TABLET | Freq: Two times a day (BID) | ORAL | 0 refills | Status: AC

## 2022-12-09 NOTE — Progress Notes (Signed)
Patient's discharge instructions and medications explained to patient and wife , they verbalized understanding.

## 2022-12-09 NOTE — Discharge Summary (Signed)
Physician Discharge Summary  JODAN BAPTIST ZOX:[id number] DOB: [birth year] DOA: 12/03/2022  PCP: [name], NP  Admit date: 12/03/2022 Discharge date: 12/09/2022 Discharging to: home Recommendations for Outpatient Follow-up:  Please follow electrolytes  Consults:  GI Palliative care   Brief hospital course: This is a 67 year old male with metastatic lung cancer, currently not on treatment as he is no longer able to tolerate it, COPD stage IV, CAD with stenting, PAD with iliac stent, iron deficiency anemia who presents to the hospital for vomiting, diarrhea and dark stools.   Upon further discussion, he admits to having a very sensitive gag reflex and over the past 2 days he has been gagging and vomiting.  He also admits to a cough with thick sputum over the past few days.  He believes he has had melena.   In the ED:  Pulse ox 86% on room air-tachycardic with heart rates in the 90s and low 100s, respiratory rate in the 20s Fecal occult blood negative Sodium 128, WBC count 16, hemoglobin 11   CTA revealed new heterogeneous and groundglass opacities throughout the lung bases particularly left, moderate right and small left pleural effusions likely chronic and loculated, right upper lobe lobectomy, severe underlying emphysema and new wedge-shaped deformity of T7   CT abdomen pelvis revealed that the gastric fundus was filled with fluid, there was thickening of the gastric body and antrum similar to prior exam.   The patient was diagnosed with pneumonia and started on antibiotics. At IV Protonix infusion was started, aspirin Plavix was held and a GI consult was requested.     Subjective:  Angry about having to stay in the hospital.  Assessment and Plan: Principal Problem:   Hematemesis/melena? - Currently has no complaints of either and stool Hemoccult was negative in ED - Appreciate GI evaluation- - Protonix has been changed to twice daily -  Hgb improved to 9.5 which seems to be  near his baseline - tolerating regular diet   Active Problems:   Pneumonia with acute hypoxic respiratory failure and tachycardia Leukocytosis - b/l pneumonia - WBC count was 16.2 and improved to ~ 11 but then increased to 17.1- now ~ 36 - very frail and may be aspiration- ? Silent aspiration- SLP eval ordered but he has declined this - complained of frequent micturition - Urine culture neg - he is being discharged with O2      Hypokalemia and hypomagnesemia - K 3.1 on admission> 2.4> 2.7 > 3.9 - Mg 1.2 > 2.2     Hyponatremia  ? SIADH related to cancer - on sodium tabs at home - improved, he is drinking well and I have stopped IVF- we discussed fluid restriction    Underweight Body mass index is 13.85 kg/m. - He states that he is unable to tolerate most dietary supplements - Continue dietary supplements as tolerated while in the hospital     Adenocarcinoma of right lung, stage 4 Cachexia related to cancer -Status post right upper lobectomy with lymph node dissection - Palliative radiotherapy to metastasis in the scapular and left supraclavicular area - Unable to tolerate chemotherapy after 12 cycles - Severe weight loss and fatigue as noted - Transitioned to DNR on 5/10 after speaking with patient and his wife together -  palliative consult requested on 5/10 - he has not made a decision on pursuing hospice or palliative care as outpt     AKI- CKD 3b - Cr improved from 1.75 to1.55 - follow prn  GOC: code status discussed with patient and his wife. DNR has been decided upon.            Discharge Instructions   Allergies as of 12/09/2022       Reactions   Compazine [prochlorperazine] Other (See Comments)   " made him high and could not sleep". He does not want to take it again.        Medication List     TAKE these medications    acetaminophen 500 MG tablet Commonly known as: TYLENOL Take 500 mg by mouth as needed for moderate pain or mild pain.    amLODipine 5 MG tablet Commonly known as: NORVASC TAKE 1 TABLET BY MOUTH DAILY   aspirin EC 81 MG tablet Take 81 mg by mouth daily.   cefUROXime 500 MG tablet Commonly known as: CEFTIN Take 1 tablet (500 mg total) by mouth 2 (two) times daily with a meal for 5 days.   cetirizine 10 MG tablet Commonly known as: ZYRTEC Take 10 mg by mouth at bedtime.   clopidogrel 75 MG tablet Commonly known as: PLAVIX TAKE 1 TABLET BY MOUTH EVERY DAY   cyclobenzaprine 10 MG tablet Commonly known as: FLEXERIL Take 1 tablet (10 mg total) by mouth 3 (three) times daily.   esomeprazole 20 MG capsule Commonly known as: NEXIUM Take 40 mg by mouth daily.   Magnesium 500 MG Tabs Take 1 tablet (500 mg total) by mouth in the morning and at bedtime.   metoprolol succinate 25 MG 24 hr tablet Commonly known as: TOPROL-XL TAKE 1 TABLET BY MOUTH DAILY   mirtazapine 15 MG tablet Commonly known as: REMERON TAKE 1 TABLET(15 MG) BY MOUTH AT BEDTIME What changed: See the new instructions.   multivitamin with minerals Tabs tablet Take 1 tablet by mouth daily.   Oxycodone HCl 10 MG Tabs Take 1 tablet by mouth as needed (Use is as needed.).   rosuvastatin 5 MG tablet Commonly known as: CRESTOR TAKE 1 TABLET(5 MG) BY MOUTH EVERY OTHER DAY What changed: See the new instructions.   sodium chloride 1 g tablet Take 1 g by mouth daily.   Trelegy Ellipta 100-62.5-25 MCG/ACT Aepb Generic drug: Fluticasone-Umeclidin-Vilant INHALE 1 PUFF INTO THE LUNGS DAILY               Durable Medical Equipment  (From admission, onward)           Start     Ordered   12/09/22 0810  For home use only DME oxygen  Once       Question Answer Comment  Length of Need 6 Months   Mode or (Route) Nasal cannula   Liters per Minute 2   Frequency Continuous (stationary and portable oxygen unit needed)   Oxygen conserving device Yes   Oxygen delivery system Gas      12/09/22 0809                The  results of significant diagnostics from this hospitalization (including imaging, microbiology, ancillary and laboratory) are listed below for reference.    DG Chest Port 1 View  Result Date: 12/06/2022 CLINICAL DATA:  Lobar pneumonia, history of lung carcinoma EXAM: PORTABLE CHEST 1 VIEW COMPARISON:  12/03/2022 FINDINGS: Cardiac shadow is stable. Aortic calcifications are. Right jugular chest wall port is seen. Small effusions are again noted bilaterally and stable. Stable airspace opacities in the left base are noted. Mild interstitial changes are seen without acute abnormality. IMPRESSION: No change from the  prior exam. Electronically Signed   By: Alcide Clever M.D.   On: 12/06/2022 20:03   CT ABDOMEN PELVIS W CONTRAST  Result Date: 12/03/2022 CLINICAL DATA:  Bowel obstruction suspected, persistent vomiting and hematemesis metastatic lung cancer * Tracking Code: BO * EXAM: CT ABDOMEN AND PELVIS WITH CONTRAST TECHNIQUE: Multidetector CT imaging of the abdomen and pelvis was performed using the standard protocol following bolus administration of intravenous contrast. RADIATION DOSE REDUCTION: This exam was performed according to the departmental dose-optimization program which includes automated exposure control, adjustment of the mA and/or kV according to patient size and/or use of iterative reconstruction technique. CONTRAST:  80mL OMNIPAQUE IOHEXOL 350 MG/ML SOLN COMPARISON:  CT chest abdomen pelvis, 09/30/2022 FINDINGS: Lower chest: Please see separately reported examination of the chest. Hepatobiliary: No solid liver abnormality is seen. No gallstones, gallbladder wall thickening, or biliary dilatation. Pancreas: Unremarkable. No pancreatic ductal dilatation or surrounding inflammatory changes. Spleen: Normal in size without significant abnormality. Adrenals/Urinary Tract: Adrenal glands are unremarkable. Kidneys are normal, without renal calculi, solid lesion, or hydronephrosis. Bladder is unremarkable.  Stomach/Bowel: The gastric fundus is fluid-filled, thickening of the gastric body and antrum appearance similar to prior examination (series 19, image 20). Appendix is not clearly visualized and may be surgically absent. No evidence of bowel wall thickening, distention, or inflammatory changes. Vascular/Lymphatic: Severe aortic atherosclerosis. Bilateral common iliac artery stents. No enlarged abdominal or pelvic lymph nodes. Reproductive: No mass or other significant abnormality. Other: No abdominal wall hernia or abnormality. No ascites. Musculoskeletal: New superior endplate deformity of L4 (series 26, image 71). IMPRESSION: 1. No acute CT findings of the abdomen or pelvis to explain vomiting or hematemesis. No evidence of bowel obstruction. 2. The gastric fundus is fluid-filled, thickening of the gastric body and antrum appearance similar to prior examination. Consider endoscopic evaluation. 3. New superior endplate deformity of L4 correlate poor acute pain and point tenderness. Aortic Atherosclerosis (ICD10-I70.0). Electronically Signed   By: Jearld Lesch M.D.   On: 12/03/2022 13:46   CT Angio Chest PE W and/or Wo Contrast  Result Date: 12/03/2022 CLINICAL DATA:  PE suspected, metastatic lung cancer * Tracking Code: BO * EXAM: CT ANGIOGRAPHY CHEST WITH CONTRAST TECHNIQUE: Multidetector CT imaging of the chest was performed using the standard protocol during bolus administration of intravenous contrast. Multiplanar CT image reconstructions and MIPs were obtained to evaluate the vascular anatomy. RADIATION DOSE REDUCTION: This exam was performed according to the departmental dose-optimization program which includes automated exposure control, adjustment of the mA and/or kV according to patient size and/or use of iterative reconstruction technique. CONTRAST:  80mL OMNIPAQUE IOHEXOL 350 MG/ML SOLN COMPARISON:  09/30/2022 FINDINGS: Cardiovascular: Satisfactory opacification of the pulmonary arteries to the  segmental level. No evidence of pulmonary embolism. Normal heart size. Left and right coronary artery calcifications. No pericardial effusion. Aortic atherosclerosis. Mediastinum/Nodes: Unchanged subcarinal lymph node, poorly visualized on this early arterial phase examination (series 9, image 70). Fluid-filled esophagus. Thyroid gland and trachea demonstrate no significant findings. Lungs/Pleura: Status post right upper lobectomy. Severe underlying emphysema. Unchanged moderate right, small left pleural effusions, likely chronic and loculated, with evidence of prior left-sided talc pleurodesis. New heterogeneous and ground-glass airspace opacity throughout the lung bases, particularly the left lung base (series 17, image 103). Upper Abdomen: No acute abnormality. Musculoskeletal: No chest wall abnormality. New inferior endplate wedge deformity of T7 (series 13, image 83). Unchanged mild superior endplate deformity of T11 (series 13, image 83). Review of the MIP images confirms the above findings. IMPRESSION:  1. Negative examination for pulmonary embolism. 2. New heterogeneous and ground-glass airspace opacity throughout the lung bases, particularly the left lung base, consistent with infection or aspiration. 3. Unchanged moderate right, small left pleural effusions, likely chronic and loculated, with evidence of prior left-sided talc pleurodesis. 4. Status post right upper lobectomy. 5. Severe underlying emphysema. 6. Coronary artery disease. 7. New inferior endplate wedge deformity of T7. Correlate for acutely referable pain and point tenderness. Aortic Atherosclerosis (ICD10-I70.0) and Emphysema (ICD10-J43.9). Electronically Signed   By: Jearld Lesch M.D.   On: 12/03/2022 13:38   DG Chest Portable 1 View  Result Date: 12/03/2022 CLINICAL DATA:  Provided history: Shortness of breath. History of stage IV lung cancer. EXAM: PORTABLE CHEST 1 VIEW COMPARISON:  Chest CT 09/30/2022. Prior chest radiographs  09/17/2022 and earlier. FINDINGS: Right chest infusion port catheter with tip at the level of the superior cavoatrial junction. The cardiomediastinal silhouette is unchanged. Aortic atherosclerosis. Small right pleural effusion. Small to moderate left pleural effusion with fluid at the left base and left apex. Underlying emphysema. Ill-defined opacities at the left lung base, slightly progressed from the prior examination of 09/17/2022. Levocurvature of the thoracic spine. Redemonstrated sclerotic changes within the left scapula. IMPRESSION: 1. Small-to-moderate left pleural effusion (with fluid at both the left base and apex), mildly increased from the prior chest radiographs of 09/17/2022. 2. Small right pleural effusion, similar to the prior exam. 3. Ill-defined opacities at the left lung base mildly increased from the prior examination. This at least partly reflects atelectasis and scarring. However, superimposed pneumonia is difficult to exclude. Correlate clinically and consider short-interval radiographic follow-up. 4. Aortic Atherosclerosis (ICD10-I70.0) and Emphysema (ICD10-J43.9). Electronically Signed   By: Jackey Loge D.O.   On: 12/03/2022 10:56   DG Shoulder Right  Result Date: 11/22/2022 CLINICAL DATA:  Right shoulder pain EXAM: RIGHT SHOULDER - 2+ VIEW COMPARISON:  None Available. FINDINGS: Right Port-A-Cath in good position. Postsurgical changes in the upper right lung peripherally. Probable vascular calcifications in the right upper extremity proximally. No fracture or dislocation.  No significant degenerative change. IMPRESSION: 1. No cause for right shoulder pain identified. 2. Probable vascular calcifications in the right upper extremity proximally. Electronically Signed   By: Gerome Sam III M.D.   On: 11/22/2022 13:11   Labs:   Basic Metabolic Panel: Recent Labs  Lab 12/03/22 0928 12/04/22 0054 12/06/22 1914 12/06/22 0943 12/07/22 0627 12/07/22 0629 12/08/22 0545  12/08/22 1737 12/09/22 0637  NA 128*   < >  --  130*  --  128* 128* 127* 128*  K 3.1*   < >  --  2.4*  --  2.5* 2.7* 4.5 3.9  CL 85*   < >  --  93*  --  90* 90* 92* 92*  CO2 30   < >  --  26  --  28 26 25 24   GLUCOSE 132*   < >  --  115*  --  99 80 65* 80  BUN 50*   < >  --  14  --  16 18 20 20   CREATININE 1.75*   < >  --  1.23  --  1.33* 1.56* 1.48* 1.55*  CALCIUM 9.6   < >  --  8.4*  --  8.4* 8.7* 8.5* 9.1  MG 2.5*  --  1.2*  --  2.2 2.2  --   --   --    < > = values in this interval not displayed.  CBC: Recent Labs  Lab 12/03/22 0928 12/03/22 1846 12/04/22 0054 12/04/22 0746 12/04/22 1525 12/05/22 0001 12/05/22 0750 12/06/22 0943 12/07/22 0629 12/08/22 0545  WBC 16.2*  --  11.9* 11.1*  --   --   --  17.1* 14.5* 14.1*  NEUTROABS 14.3*  --   --   --   --   --   --   --   --   --   HGB 11.6*   < > 7.6* 8.1*   < > 7.8* 7.7* 9.5* 9.4* 9.5*  HCT 34.5*   < > 22.6* 25.5*   < > 23.7* 24.2* 29.2* 28.7* 28.4*  MCV 91.8  --  92.2 94.4  --   --   --  94.5 93.2 92.5  PLT 529*  --  350 373  --   --   --  401* 380 391   < > = values in this interval not displayed.         SIGNED:   Calvert Cantor, MD  [facility] 12/09/2022, 8:12 AM

## 2022-12-09 NOTE — Progress Notes (Signed)
  Daily Progress Note   Patient Name: TARIQUE SUDERMAN       Date: 12/09/2022 DOB: 10/05/1955  Age: 67 y.o. MRN#: [id number] Attending Physician: Calvert Cantor, MD Primary Care Physician: [name], NP Admit Date: 12/03/2022 Length of Stay: 5 days  Patient last seen by this palliative provider 12/08/22. Have discussed possible pathways for medical care moving forward including home palliative vs home hospice support. Patient wants to be discharged, follow up with his insurance company about coverage, and then make decisions as to the type of support/medical care he wants moving forward. [facility] liaison met with patient and his wife to discuss services available as well and provided contact information should patient decide to pursue palliative or hospice care in the future. Please reach out if our team can be of further assistance in the future. Thank you for involving our team in patient's care.    Alvester Morin, DO Palliative Care Provider PMT # [phone]

## 2022-12-09 NOTE — Progress Notes (Signed)
Late entry for 12/08/22 at 1855. Attempted to notify Dr Butler Denmark with lab results for patient who desired to be discharged if potassium levels were within normal limits. MD was sent secure chats and page on Amion without response.

## 2022-12-09 NOTE — Progress Notes (Signed)
Please see previous note from Scarlett Presto, Charity fundraiser. She did complete ambulatory oxygen and it is documented 5/12 (from performing on 5/11). Thanks!

## 2022-12-10 ENCOUNTER — Telehealth: Payer: Self-pay

## 2022-12-10 ENCOUNTER — Telehealth: Payer: Self-pay | Admitting: Internal Medicine

## 2022-12-10 ENCOUNTER — Inpatient Hospital Stay: Payer: BC Managed Care – PPO

## 2022-12-10 NOTE — Transitions of Care (Post Inpatient/ED Visit) (Signed)
12/10/2022  Name: Sean Rose MRN: [id number] DOB: Feb 28, 1956  Today's TOC FU Call Status: Today's TOC FU Call Status:: Successful TOC FU Call Competed TOC FU Call Complete Date: 12/10/22 (Incoming call from spouse returning RN CM call. She requests that all phone calls to check on patient be made to her and not the patient as he is not up to talking on the phone.)  Transition Care Management Follow-up Telephone Call Date of Discharge: 12/09/22 Discharge Facility: [facility]) Type of Discharge: Inpatient Admission Primary Inpatient Discharge Diagnosis:: "hematemesis w/ nausea" How have you been since you were released from the hospital?: Better (Spouse states patient is "doing better overall-more alert,talkative,eating and sleeping better.") Any questions or concerns?: Yes Patient Questions/Concerns:: Spouse states pt has been compainign of some mild SOB at times- states he was told in the hospital that he would possibly go home with oxygen at discharge and inquiring about it. Patient Questions/Concerns Addressed: Other:, Notified Provider of Patient Questions/Concerns (Inpt note on 12/07/22-home oxygen test performed and pt did not qualify. Discussed this with wife. Reviewed ways to manage SOB at home and when to seek medical attention. Pt is using inhaler as ordered.Encouraged PCP follow up appt for further eval and tx)  Items Reviewed: Did you receive and understand the discharge instructions provided?: Yes Medications obtained,verified, and reconciled?: Yes (Medications Reviewed) (Wife wanting to know if PCP would be willing to manage patient's pain meds-she wishes to call office herself to discuss-confirmed pt has enough pain meds in the home at present) Any new allergies since your discharge?: No Dietary orders reviewed?: NA Do you have support at home?: Yes People in Home: spouse Name of Support/Comfort Primary Source: [name]  Medications Reviewed Today: Medications Reviewed  Today     Reviewed by Charlyn Minerva, RN (Registered Nurse) on 12/10/22 at 1517  Med List Status: <None>   Medication Order Taking? Sig Documenting Provider Last Dose Status Informant  acetaminophen (TYLENOL) 500 MG tablet [id number] Yes Take 500 mg by mouth as needed for moderate pain or mild pain. [provider] Taking Active Spouse/Significant Other  amLODipine (NORVASC) 5 MG tablet [id number] Yes TAKE 1 TABLET BY MOUTH DAILY Runell Gess, MD Taking Active Spouse/Significant Other  aspirin EC 81 MG tablet [id number] Yes Take 81 mg by mouth daily. [provider] Taking Active Spouse/Significant Other  cefUROXime (CEFTIN) 500 MG tablet [id number] Yes Take 1 tablet (500 mg total) by mouth 2 (two) times daily with a meal for 5 days. Calvert Cantor, MD Taking Active   cetirizine (ZYRTEC) 10 MG tablet [id number] Yes Take 10 mg by mouth at bedtime. [provider] Taking Active Spouse/Significant Other  clopidogrel (PLAVIX) 75 MG tablet [id number] Yes TAKE 1 TABLET BY MOUTH EVERY DAY Runell Gess, MD Taking Active Spouse/Significant Other  cyclobenzaprine (FLEXERIL) 10 MG tablet [id number] Yes Take 1 tablet (10 mg total) by mouth 3 (three) times daily. Nafziger, Kandee Keen, NP Taking Active Spouse/Significant Other  esomeprazole (NEXIUM) 20 MG capsule [id number] Yes Take 40 mg by mouth daily. [provider] Taking Active Spouse/Significant Other  Magnesium 500 MG TABS [id number] Yes Take 1 tablet (500 mg total) by mouth in the morning and at bedtime. Rowe Clack, PA-C Taking Active Spouse/Significant Other  metoprolol succinate (TOPROL-XL) 25 MG 24 hr tablet [id number] Yes TAKE 1 TABLET BY MOUTH DAILY Runell Gess, MD Taking Active Spouse/Significant Other  mirtazapine (REMERON) 15 MG tablet [id number] Yes TAKE 1 TABLET(15 MG) BY MOUTH AT BEDTIME  Patient taking differently: Take 15 mg by mouth at bedtime.   [name], NP Taking Active  Spouse/Significant Other           Med Note Otis Dials Sep 03, 2022  9:28 AM) Patient is not currently taking this medication.  Multiple Vitamin (MULTIVITAMIN WITH MINERALS) TABS tablet [id number] Yes Take 1 tablet by mouth daily. [provider] Taking Active Spouse/Significant Other  Oxycodone HCl 10 MG TABS [id number] Yes Take 1 tablet by mouth as needed (Use is as needed.). Spouse reports taking one tablet about 2x/day [provider] Taking Active Spouse/Significant Other  rosuvastatin (CRESTOR) 5 MG tablet [id number] Yes TAKE 1 TABLET(5 MG) BY MOUTH EVERY OTHER DAY  Patient taking differently: Take 5 mg by mouth every other day.   Runell Gess, MD Taking Active Spouse/Significant Other  sodium chloride 1 g tablet [id number] Yes Take 1 g by mouth daily. [provider] Taking Active Spouse/Significant Other  TRELEGY ELLIPTA 100-62.5-25 MCG/ACT AEPB [id number] Yes INHALE 1 PUFF INTO THE LUNGS DAILY Nafziger, Kandee Keen, NP Taking Active Spouse/Significant Other            Home Care and Equipment/Supplies: Were Home Health Services Ordered?: No Any new equipment or medical supplies ordered?: No  Functional Questionnaire: Do you need assistance with bathing/showering or dressing?: No Do you need assistance with meal preparation?: No Do you need assistance with eating?: No Do you have difficulty maintaining continence: No Do you need assistance with getting out of bed/getting out of a chair/moving?: No Do you have difficulty managing or taking your medications?: No  Follow up appointments reviewed: PCP Follow-up appointment confirmed?: No (offered to assist with making PCP appt-spouse preferred to wait and call office later-states pt not up to in person visit-encouraged her to consider virtual/telehealth appt) MD Provider Line Number:[phone] Given: No Specialist Hospital Follow-up appointment confirmed?: Yes Date of Specialist follow-up  appointment?: 02/13/23 Follow-Up Specialty Provider:: Dr. [name] Do you need transportation to your follow-up appointment?: No Do you understand care options if your condition(s) worsen?: Yes-patient verbalized understanding  SDOH Interventions Today    Flowsheet Row Most Recent Value  SDOH Interventions   Food Insecurity Interventions Intervention Not Indicated  Transportation Interventions Intervention Not Indicated      TOC Interventions Today    Flowsheet Row Most Recent Value  TOC Interventions   TOC Interventions Discussed/Reviewed TOC Interventions Discussed, Contacted provider for patient needs      Interventions Today    Flowsheet Row Most Recent Value  Chronic Disease   Chronic disease during today's visit Other  [cancer, PNA]  General Interventions   General Interventions Discussed/Reviewed General Interventions Discussed, Doctor Visits, Durable Medical Equipment (DME), Referral to Nurse  [follow up appt scheduled with RN care coordinator]  Doctor Visits Discussed/Reviewed Doctor Visits Discussed, PCP, Specialist  Durable Medical Equipment (DME) Oxygen  PCP/Specialist Visits Compliance with follow-up visit  Education Interventions   Education Provided Provided Education  Provided Verbal Education On Nutrition, When to see the doctor, Medication, Other  [sx mgmt-pain mgmt, resp mgmt]  Nutrition Interventions   Nutrition Discussed/Reviewed Nutrition Discussed  Pharmacy Interventions   Pharmacy Dicussed/Reviewed Pharmacy Topics Discussed, Medications and their functions  Advanced Directive Interventions   Advanced Directives Discussed/Reviewed End of Life, Advanced Directives Discussed  End of Life Palliative, Hospice  [pt with stage 4 lung CA w/ mets-no longer getting txs as he is unable to tolerate them-explained to wife the difference between hospice and palliative care-they are still  trying to decide which route they will pursue]       Antionette Fairy,  RN,BSN,CCM [facility] Care Management Care Management Community Coordinator Direct Phone: [phone] Toll Free: [phone] Fax: [phone]

## 2022-12-10 NOTE — Transitions of Care (Post Inpatient/ED Visit) (Signed)
   12/10/2022  Name: [name] MRN: [id number] DOB: 08/01/1955  Today's TOC FU Call Status: Today's TOC FU Call Status:: Unsuccessul Call (1st Attempt) Unsuccessful Call (1st Attempt) Date: 12/10/22  Attempted to reach the patient regarding the most recent Inpatient/ED visit.  Follow Up Plan: Additional outreach attempts will be made to reach the patient to complete the Transitions of Care (Post Inpatient/ED visit) call.     Antionette Fairy, RN,BSN,CCM [facility] Care Management Care Management Community Coordinator Direct Phone: [phone] Toll Free: [phone] Fax: [phone]

## 2022-12-13 ENCOUNTER — Ambulatory Visit: Payer: Self-pay

## 2022-12-13 NOTE — Patient Instructions (Signed)
Visit Information  Thank you for taking time to visit with me today. Please don't hesitate to contact me if I can be of assistance to you.   Following are the goals we discussed today:   Goals Addressed             This Visit's Progress    Lung cancer       Care Coordination Interventions: Evaluation of current treatment plan related to Lung cancer and patient's adherence to plan as established by provider Discussed plans with patient for ongoing care management follow up and provided patient with direct contact information for care management team Discussed Hospice support. Reviewed upcoming provider appointments and treatment appointments Assessed support system. Has consistent/reliable family or other support: Yes Spouse to call PCP for follow up.  Active listening / Reflection utilized  Emotional Support Provided         Our next appointment is by telephone on 01/03/23 at 1200 pm  Please call the care guide team at [phone] if you need to cancel or reschedule your appointment.   If you are experiencing a Mental Health or Behavioral Health Crisis or need someone to talk to, please call the Suicide and Crisis Lifeline: 988   Patient verbalizes understanding of instructions and care plan provided today and agrees to view in MyChart. Active MyChart status and patient understanding of how to access instructions and care plan via MyChart confirmed with patient.     The patient has been provided with contact information for the care management team and has been advised to call with any health related questions or concerns.   Bary Leriche, RN, MSN [facility] Care Management Care Management Coordinator Direct Line [phone]

## 2022-12-13 NOTE — Patient Outreach (Signed)
  Care Coordination   Initial Visit Note   12/13/2022 Name: Sean Rose MRN: [id number] DOB: 01-11-1956  Sean Rose is a 67 y.o. year old male who sees Nafziger, Kandee Keen, NP for primary care. I  spoke with spouse Sean Rose by phone day.  Patient doing better, still some weakness, shortness of breath comes and goes.  Wife to schedule follow up with PCP. Patient no longer in treatment for cancer due to intolerance.   What matters to the patients health and wellness today?  Maintaining.      Goals Addressed             This Visit's Progress    Lung cancer       Care Coordination Interventions: Evaluation of current treatment plan related to Lung cancer and patient's adherence to plan as established by provider Discussed plans with patient for ongoing care management follow up and provided patient with direct contact information for care management team Discussed Hospice support. Reviewed upcoming provider appointments and treatment appointments Assessed support system. Has consistent/reliable family or other support: Yes Spouse to call PCP for follow up.  Active listening / Reflection utilized  Emotional Support Provided         SDOH assessments and interventions completed:  Yes  SDOH Interventions Today    Flowsheet Row Most Recent Value  SDOH Interventions   Housing Interventions Intervention Not Indicated  Utilities Interventions Intervention Not Indicated        Care Coordination Interventions:  Yes, provided   Follow up plan: Follow up call scheduled for June    Encounter Outcome:  Pt. Visit Completed   Sean Leriche, RN, MSN [facility] Care Management Care Management Coordinator Direct Line [phone]

## 2022-12-18 ENCOUNTER — Telehealth: Payer: Self-pay | Admitting: Adult Health

## 2022-12-18 MED ORDER — OXYCODONE HCL 10 MG PO TABS: 10.0000 mg | ORAL_TABLET | Freq: Three times a day (TID) | ORAL | 0 refills | Status: AC

## 2022-12-18 NOTE — Telephone Encounter (Signed)
Please advise 

## 2022-12-18 NOTE — Telephone Encounter (Signed)
Patient spouse notified of update  and verbalized understanding. 

## 2022-12-18 NOTE — Telephone Encounter (Signed)
Noted  

## 2022-12-18 NOTE — Telephone Encounter (Signed)
Pt's spouse called to say Pt was hospitalized recently and was prescribed Oxycodone HCl 10 MG TABS.  Pt is almost out of this Rx and has a HFU scheduled with NP for next Wednesday.  Spouse is asking if NP would be willing to send even just a few pills to last until that HFU on 12/25/22?  Please advise.   [id number] [place], [place] - [address] AT [facility] [address] [facility] [address] Phone: [phone]  Fax: [phone]

## 2022-12-21 ENCOUNTER — Other Ambulatory Visit: Payer: Self-pay | Admitting: Cardiovascular Disease

## 2022-12-25 ENCOUNTER — Inpatient Hospital Stay: Payer: BC Managed Care – PPO | Admitting: Adult Health

## 2022-12-25 NOTE — Progress Notes (Deleted)
Subjective:    Patient ID: Sean Rose, male    DOB: 1956/03/20, 67 y.o.   MRN: [id number]  HPI  67 year old male who  has a past medical history of Allergy, Anemia, Anginal pain (HCC), Blood transfusion without reported diagnosis, CAD (coronary artery disease), CAP (community acquired pneumonia) (09/2016), COPD (chronic obstructive pulmonary disease) (HCC), Dyspnea, Emphysema of lung (HCC), GERD (gastroesophageal reflux disease), Heart murmur, Hemorrhoids, History of anal fissures, Hyperlipidemia, Hypertension, lung ca (dx'd 08/2018), Myocardial infarction [facility]), Peripheral arterial disease (HCC), Seasonal allergies, and Tobacco abuse.  He presents to the office today for TCM visit   Admit Date 12/03/2022 Discharge Date 12/09/2022  He is a 67 year old male with metastatic lung cancer, currently not on treatment as he is no longer able to tolerate it.  Presented to the hospital for vomiting diarrhea and dark stools.  He admitted to having a very sensitive gag reflex and over the last 2 days he had been gagging and vomiting.  He also admitted to coughing with thick sputum over the past few days.  He believes that he had blood in the stool.  In the emergency room his pulse ox was 86% on room air, slightly tachycardic with heart rate in the 90s to low 100s, respiratory rate in the 20s.  Fecal occult blood was negative.  Sodium 128, WBC 16, hemoglobin 11  CTA revealed new heterogeneous and groundglass opacities throughout the lung bases particularly left, moderate right, and small pleural effusions likely chronic and loculated, right upper lobe lobectomy, severe underlying emphysema and new wedge-shaped deformity of T7.  CT abdomen pelvis revealed that the gastric fundus was filled with fluid, there was thickening of the gastric body and antrum similar to prior exam.  He was diagnosed with pneumonia and started on antibiotics.  IV Protonix was started, aspirin and Plavix were held.  GI was  consulted  Hospital Course   Pneumonia with acute hypoxic failure and tachycardia  -White blood cell count was 16.2 and improved to 11 but then increased to 17.1.  At discharge WBC was 14. -May be due to aspiration, questionable silent aspiration.  He declined SLP eval. -Was discharged home on O2  Hypokalemia and hypomagnesemia  - K 3.1 on admission >2.4>2.7>3.9 - Mg 1.2 >2.2  Hyponatremia  -SIADH related to cancer. -Take sodium tabs at home -Pain level improved, he was drinking well and IV fluids were stopped -Fluid Restrictions discussed  Underweight  -Mass index 13.85 kg/m2 -unable to tolerate most dietary supplements  Adenocarcinoma of right lung, stage IV -Cachexia related to cancer -Status post right upper lobectomy with lymph node dissection -Palliative radiotherapy to mets in the scapular and left supra clavicular area.  He is unable to tolerate chemotherapy after 12 cycles. -Was transitioneed to DNR on 12/06/2022 -Palliative consult requested on 12/06/2022, he had not made up a decision on pursuing hospice or palliative care as outpatient  AKI-Kd stage IIIb -Improved from 1.75-1.55   Review of Systems  Constitutional: Negative.   HENT: Negative.    Eyes: Negative.   Respiratory: Negative.    Cardiovascular: Negative.   Gastrointestinal: Negative.   Endocrine: Negative.   Genitourinary: Negative.   Musculoskeletal: Negative.   Skin: Negative.   Allergic/Immunologic: Negative.   Neurological:  Positive for weakness.  Hematological: Negative.   Psychiatric/Behavioral: Negative.    All other systems reviewed and are negative.      Objective:   Physical Exam Constitutional:      Appearance: He is underweight.  He is ill-appearing (chronically).  Cardiovascular:     Rate and Rhythm: Normal rate and regular rhythm.     Pulses: Normal pulses.     Heart sounds: Normal heart sounds.  Pulmonary:     Effort: Pulmonary effort is normal.     Breath sounds:  Normal breath sounds.  Skin:    General: Skin is warm and dry.  Neurological:     General: No focal deficit present.     Mental Status: He is alert and oriented to person, place, and time.  Psychiatric:        Mood and Affect: Mood normal.        Behavior: Behavior normal. Behavior is cooperative.        Thought Content: Thought content normal.        Judgment: Judgment normal.           Assessment & Plan:

## 2022-12-30 DIAGNOSIS — R531 Weakness: Secondary | ICD-10-CM | POA: Diagnosis not present

## 2022-12-30 DIAGNOSIS — R1111 Vomiting without nausea: Secondary | ICD-10-CM | POA: Diagnosis not present

## 2022-12-30 DIAGNOSIS — E871 Hypo-osmolality and hyponatremia: Secondary | ICD-10-CM | POA: Diagnosis not present

## 2022-12-30 DIAGNOSIS — R112 Nausea with vomiting, unspecified: Secondary | ICD-10-CM | POA: Diagnosis not present

## 2022-12-30 DIAGNOSIS — E860 Dehydration: Secondary | ICD-10-CM | POA: Diagnosis not present

## 2022-12-31 ENCOUNTER — Inpatient Hospital Stay: Payer: BC Managed Care – PPO | Admitting: Adult Health

## 2022-12-31 ENCOUNTER — Other Ambulatory Visit: Payer: Self-pay

## 2022-12-31 ENCOUNTER — Encounter (HOSPITAL_COMMUNITY): Payer: Self-pay

## 2022-12-31 ENCOUNTER — Emergency Department (HOSPITAL_COMMUNITY): Payer: BC Managed Care – PPO

## 2022-12-31 ENCOUNTER — Emergency Department (HOSPITAL_COMMUNITY)
Admission: EM | Admit: 2022-12-31 | Discharge: 2022-12-31 | Disposition: A | Discharge status: Home / Self Care | Payer: BC Managed Care – PPO | Attending: Emergency Medicine | Admitting: Emergency Medicine

## 2022-12-31 DIAGNOSIS — R112 Nausea with vomiting, unspecified: Secondary | ICD-10-CM | POA: Insufficient documentation

## 2022-12-31 DIAGNOSIS — E871 Hypo-osmolality and hyponatremia: Secondary | ICD-10-CM | POA: Diagnosis not present

## 2022-12-31 DIAGNOSIS — R197 Diarrhea, unspecified: Secondary | ICD-10-CM | POA: Diagnosis not present

## 2022-12-31 DIAGNOSIS — J449 Chronic obstructive pulmonary disease, unspecified: Secondary | ICD-10-CM | POA: Insufficient documentation

## 2022-12-31 DIAGNOSIS — Z7902 Long term (current) use of antithrombotics/antiplatelets: Secondary | ICD-10-CM | POA: Diagnosis not present

## 2022-12-31 DIAGNOSIS — Z79899 Other long term (current) drug therapy: Secondary | ICD-10-CM | POA: Insufficient documentation

## 2022-12-31 DIAGNOSIS — I10 Essential (primary) hypertension: Secondary | ICD-10-CM | POA: Diagnosis not present

## 2022-12-31 DIAGNOSIS — R109 Unspecified abdominal pain: Secondary | ICD-10-CM | POA: Diagnosis not present

## 2022-12-31 DIAGNOSIS — I700 Atherosclerosis of aorta: Secondary | ICD-10-CM | POA: Diagnosis not present

## 2022-12-31 DIAGNOSIS — Z7982 Long term (current) use of aspirin: Secondary | ICD-10-CM | POA: Diagnosis not present

## 2022-12-31 DIAGNOSIS — I2510 Atherosclerotic heart disease of native coronary artery without angina pectoris: Secondary | ICD-10-CM | POA: Diagnosis not present

## 2022-12-31 DIAGNOSIS — Z7401 Bed confinement status: Secondary | ICD-10-CM | POA: Diagnosis not present

## 2022-12-31 DIAGNOSIS — E860 Dehydration: Secondary | ICD-10-CM | POA: Diagnosis not present

## 2022-12-31 DIAGNOSIS — R262 Difficulty in walking, not elsewhere classified: Secondary | ICD-10-CM | POA: Diagnosis not present

## 2022-12-31 LAB — URINALYSIS, ROUTINE W REFLEX MICROSCOPIC
Bilirubin Urine: NEGATIVE
Glucose, UA: NEGATIVE mg/dL
Hgb urine dipstick: NEGATIVE
Ketones, ur: 5 mg/dL — AB
Leukocytes,Ua: NEGATIVE
Nitrite: NEGATIVE
Protein, ur: NEGATIVE mg/dL
Specific Gravity, Urine: 1.006 (ref 1.005–1.030)
pH: 7 (ref 5.0–8.0)

## 2022-12-31 LAB — CBC WITH DIFFERENTIAL/PLATELET
Abs Immature Granulocytes: 0.04 10*3/uL (ref 0.00–0.07)
Basophils Absolute: 0 10*3/uL (ref 0.0–0.1)
Basophils Relative: 0 %
Eosinophils Absolute: 0 10*3/uL (ref 0.0–0.5)
Eosinophils Relative: 0 %
HCT: 29.1 % — ABNORMAL LOW (ref 39.0–52.0)
Hemoglobin: 9.9 g/dL — ABNORMAL LOW (ref 13.0–17.0)
Immature Granulocytes: 0 %
Lymphocytes Relative: 8 %
Lymphs Abs: 1 10*3/uL (ref 0.7–4.0)
MCH: 31.8 pg (ref 26.0–34.0)
MCHC: 34 g/dL (ref 30.0–36.0)
MCV: 93.6 fL (ref 80.0–100.0)
Monocytes Absolute: 0.9 10*3/uL (ref 0.1–1.0)
Monocytes Relative: 7 %
Neutro Abs: 11 10*3/uL — ABNORMAL HIGH (ref 1.7–7.7)
Neutrophils Relative %: 85 %
Platelets: 606 10*3/uL — ABNORMAL HIGH (ref 150–400)
RBC: 3.11 MIL/uL — ABNORMAL LOW (ref 4.22–5.81)
RDW: 15.8 % — ABNORMAL HIGH (ref 11.5–15.5)
WBC: 12.9 10*3/uL — ABNORMAL HIGH (ref 4.0–10.5)
nRBC: 0 % (ref 0.0–0.2)

## 2022-12-31 LAB — COMPREHENSIVE METABOLIC PANEL
ALT: 15 U/L (ref 0–44)
AST: 23 U/L (ref 15–41)
Albumin: 2.8 g/dL — ABNORMAL LOW (ref 3.5–5.0)
Alkaline Phosphatase: 77 U/L (ref 38–126)
Anion gap: 12 (ref 5–15)
BUN: 25 mg/dL — ABNORMAL HIGH (ref 8–23)
CO2: 26 mmol/L (ref 22–32)
Calcium: 9.3 mg/dL (ref 8.9–10.3)
Chloride: 87 mmol/L — ABNORMAL LOW (ref 98–111)
Creatinine, Ser: 1.61 mg/dL — ABNORMAL HIGH (ref 0.61–1.24)
GFR, Estimated: 47 mL/min — ABNORMAL LOW (ref >60)
Glucose, Bld: 104 mg/dL — ABNORMAL HIGH (ref 70–99)
Potassium: 3.7 mmol/L (ref 3.5–5.1)
Sodium: 125 mmol/L — ABNORMAL LOW (ref 135–145)
Total Bilirubin: 0.6 mg/dL (ref 0.3–1.2)
Total Protein: 6.6 g/dL (ref 6.5–8.1)

## 2022-12-31 LAB — MAGNESIUM: Magnesium: 2.5 mg/dL — ABNORMAL HIGH (ref 1.7–2.4)

## 2022-12-31 LAB — LIPASE, BLOOD: Lipase: 38 U/L (ref 11–51)

## 2022-12-31 MED ORDER — SODIUM CHLORIDE 0.9 % IV BOLUS
1000.0000 mL | Freq: Once | INTRAVENOUS | Status: AC
  Administered 2022-12-31: 1000 mL via INTRAVENOUS

## 2022-12-31 MED ORDER — SODIUM CHLORIDE 0.9 % IV SOLN
6.2500 mg | Freq: Four times a day (QID) | INTRAVENOUS | Status: DC | PRN
  Administered 2022-12-31: 6.25 mg via INTRAVENOUS
  Filled 2022-12-31: qty 0.25

## 2022-12-31 MED ORDER — MORPHINE SULFATE (PF) 4 MG/ML IV SOLN
4.0000 mg | Freq: Once | INTRAVENOUS | Status: AC
  Administered 2022-12-31: 4 mg via INTRAVENOUS
  Filled 2022-12-31: qty 1

## 2022-12-31 MED ORDER — HEPARIN SOD (PORK) LOCK FLUSH 100 UNIT/ML IV SOLN
500.0000 [IU] | Freq: Once | INTRAVENOUS | Status: AC
  Administered 2022-12-31: 500 [IU]
  Filled 2022-12-31: qty 5

## 2022-12-31 MED ORDER — ONDANSETRON HCL 4 MG PO TABS: 4.0000 mg | ORAL_TABLET | Freq: Three times a day (TID) | ORAL | 0 refills | Status: DC | PRN

## 2022-12-31 MED ORDER — ONDANSETRON HCL 4 MG/2ML IJ SOLN
4.0000 mg | Freq: Once | INTRAMUSCULAR | Status: AC
  Administered 2022-12-31: 4 mg via INTRAVENOUS
  Filled 2022-12-31: qty 2

## 2022-12-31 NOTE — ED Notes (Addendum)
Patient noted to have port, and when I advised that IV  was not flushing properly, he advised I could use port if needed. X-ray from 12/03/22 confirmed proper placement of tip of line, so port accessed with power port set for radiology access if needed.

## 2022-12-31 NOTE — ED Notes (Signed)
Patient reported having taken small sips of ginger ale, and not having any more periods of emesis, however repeats continuous hiccups and belching.

## 2022-12-31 NOTE — ED Notes (Signed)
[facility] called; second in line. 

## 2022-12-31 NOTE — ED Notes (Signed)
Pt was incontinent of BM, cleaned pt and applied a clean brief. 

## 2022-12-31 NOTE — ED Triage Notes (Signed)
Pt arrived with [facility] from home for NV and uncontrolled bowel movements.hx of cancer. EMS gave 24mg  IV AC. EMS VS 134/86 pulse 92, 98%

## 2022-12-31 NOTE — ED Provider Notes (Addendum)
[name] EMERGENCY DEPARTMENT AT [facility] Provider Note   CSN: [id number] Arrival date & time: 12/31/22  0004     History  Chief Complaint  Patient presents with   Diarrhea   Emesis   Abdominal Pain    Sean Rose is a 67 y.o. male.  HPI     This is a 67 year old male with a history of stage IV lung cancer who presents with vomiting and diarrhea.  Patient reports 2-day history of copious diarrhea.  Scribes nonbloody diarrhea.  Also reports bilious emesis.  Reports crampy abdominal pain.  Was given Zofran by EMS with some improvement of his vomiting.  Not currently undergoing any chemotherapy treatments.  No known sick contacts.  No fevers.  Home Medications Prior to Admission medications   Medication Sig Start Date End Date Taking? Authorizing Provider  ondansetron (ZOFRAN) 4 MG tablet Take 1 tablet (4 mg total) by mouth every 8 (eight) hours as needed for nausea or vomiting. 12/31/22  Yes Leanore Biggers, Mayer Masker, MD  acetaminophen (TYLENOL) 500 MG tablet Take 500 mg by mouth as needed for moderate pain or mild pain.    [provider]  amLODipine (NORVASC) 5 MG tablet TAKE 1 TABLET BY MOUTH DAILY 06/24/22   Runell Gess, MD  aspirin EC 81 MG tablet Take 81 mg by mouth daily.    [provider]  cetirizine (ZYRTEC) 10 MG tablet Take 10 mg by mouth at bedtime.    [provider]  clopidogrel (PLAVIX) 75 MG tablet TAKE 1 TABLET BY MOUTH EVERY DAY 09/23/22   Runell Gess, MD  cyclobenzaprine (FLEXERIL) 10 MG tablet Take 1 tablet (10 mg total) by mouth 3 (three) times daily. 11/27/22   Nafziger, Kandee Keen, NP  esomeprazole (NEXIUM) 20 MG capsule Take 40 mg by mouth daily.    [provider]  Magnesium 500 MG TABS Take 1 tablet (500 mg total) by mouth in the morning and at bedtime. 06/26/22   Gold, Deniece Portela E, PA-C  metoprolol succinate (TOPROL-XL) 25 MG 24 hr tablet TAKE 1 TABLET BY MOUTH DAILY 08/23/22   Runell Gess, MD  mirtazapine  (REMERON) 15 MG tablet TAKE 1 TABLET(15 MG) BY MOUTH AT BEDTIME Patient taking differently: Take 15 mg by mouth at bedtime. 06/26/22   Nafziger, Kandee Keen, NP  Multiple Vitamin (MULTIVITAMIN WITH MINERALS) TABS tablet Take 1 tablet by mouth daily.    [provider]  rosuvastatin (CRESTOR) 5 MG tablet TAKE 1 TABLET(5 MG) BY MOUTH EVERY OTHER DAY Patient taking differently: Take 5 mg by mouth every other day. 09/23/22   Runell Gess, MD  sodium chloride 1 g tablet Take 1 g by mouth daily.    [provider]  Dwyane Luo 100-62.5-25 MCG/ACT AEPB INHALE 1 PUFF INTO THE LUNGS DAILY 10/22/22   [name], NP      Allergies    Compazine [prochlorperazine]    Review of Systems   Review of Systems  Constitutional:  Negative for fever.  Gastrointestinal:  Positive for abdominal pain, diarrhea, nausea and vomiting. Negative for blood in stool.  All other systems reviewed and are negative.   Physical Exam Updated Vital Signs BP 126/85   Pulse 87   Temp 98.1 F (36.7 C) (Oral)   Resp 17   Ht 1.702 m (5\' 7" )   Wt 36.3 kg   SpO2 96%   BMI 12.53 kg/m  Physical Exam Vitals and nursing note reviewed.  Constitutional:  Comments: Chronically ill-appearing, cachectic  HENT:     Head: Normocephalic and atraumatic.  Eyes:     Pupils: Pupils are equal, round, and reactive to light.  Cardiovascular:     Rate and Rhythm: Normal rate and regular rhythm.     Heart sounds: Normal heart sounds. No murmur heard. Pulmonary:     Effort: Pulmonary effort is normal. No respiratory distress.     Breath sounds: Normal breath sounds. No wheezing.  Abdominal:     General: Bowel sounds are normal.     Palpations: Abdomen is soft.     Tenderness: There is generalized abdominal tenderness. There is no guarding or rebound.     Comments: Scaphoid abdomen  Musculoskeletal:     Cervical back: Neck supple.  Lymphadenopathy:     Cervical: No cervical adenopathy.  Skin:    General:  Skin is warm and dry.  Neurological:     General: No focal deficit present.     Mental Status: He is alert and oriented to person, place, and time.     ED Results / Procedures / Treatments   Labs (all labs ordered are listed, but only abnormal results are displayed) Labs Reviewed  CBC WITH DIFFERENTIAL/PLATELET - Abnormal; Notable for the following components:      Result Value   WBC 12.9 (*)    RBC 3.11 (*)    Hemoglobin 9.9 (*)    HCT 29.1 (*)    RDW 15.8 (*)    Platelets 606 (*)    Neutro Abs 11.0 (*)    All other components within normal limits  COMPREHENSIVE METABOLIC PANEL - Abnormal; Notable for the following components:   Sodium 125 (*)    Chloride 87 (*)    Glucose, Bld 104 (*)    BUN 25 (*)    Creatinine, Ser 1.61 (*)    Albumin 2.8 (*)    GFR, Estimated 47 (*)    All other components within normal limits  MAGNESIUM - Abnormal; Notable for the following components:   Magnesium 2.5 (*)    All other components within normal limits  URINALYSIS, ROUTINE W REFLEX MICROSCOPIC - Abnormal; Notable for the following components:   Ketones, ur 5 (*)    All other components within normal limits  LIPASE, BLOOD    EKG EKG Interpretation  Date/Time:  Tuesday December 31 2022 00:15:10 EDT Ventricular Rate:  89 PR Interval:  119 QRS Duration: 57 QT Interval:  354 QTC Calculation: 431 R Axis:   84 Text Interpretation: Sinus rhythm Ventricular premature complex Borderline short PR interval Borderline right axis deviation Borderline repolarization abnormality Confirmed by Ross Marcus ([id number]) on 12/31/2022 12:31:28 AM  Radiology CT ABDOMEN PELVIS WO CONTRAST  Result Date: 12/31/2022 CLINICAL DATA:  Acute abdominal pain and incontinence EXAM: CT ABDOMEN AND PELVIS WITHOUT CONTRAST TECHNIQUE: Multidetector CT imaging of the abdomen and pelvis was performed following the standard protocol without IV contrast. RADIATION DOSE REDUCTION: This exam was performed according to the  departmental dose-optimization program which includes automated exposure control, adjustment of the mA and/or kV according to patient size and/or use of iterative reconstruction technique. COMPARISON:  12/03/2022 FINDINGS: Lower chest: Pleural plaquing is noted. Bilateral pleural effusions are noted right greater than left. Minimal atelectatic changes are noted. Hepatobiliary: No focal liver abnormality is seen. No bladder is decompressed. Pancreas: Unremarkable. No pancreatic ductal dilatation or surrounding inflammatory changes. Spleen: Normal in size without focal abnormality. Adrenals/Urinary Tract: Adrenal glands are within normal limits. Kidneys demonstrate no  renal calculi or obstructive changes. The bladder is well distended. Stomach/Bowel: Fecal material is noted scattered throughout the colon. No obstructive changes are seen. Small bowel is unremarkable. Stomach again demonstrates some wall thickening similar to that seen on the prior exam. Vascular/Lymphatic: Aortic atherosclerosis. No enlarged abdominal or pelvic lymph nodes. Reproductive: Prostate is unremarkable. Other: No abdominal wall hernia or abnormality. No abdominopelvic ascites. Musculoskeletal: L4 compression deformity stable from the prior exam. IMPRESSION: Bilateral pleural effusions right greater than left. Pleural plaques are seen as well as mild atelectatic changes. No renal calculi or obstructive changes are noted. Stable wall thickening of the stomach similar to that seen on prior exam. Chronic L4 compression deformity. Electronically Signed   By: Alcide Clever M.D.   On: 12/31/2022 02:41    Procedures Procedures    Medications Ordered in ED Medications  promethazine (PHENERGAN) 6.25 mg in sodium chloride 0.9 % 50 mL IVPB (0 mg Intravenous Stopped 12/31/22 0256)  sodium chloride 0.9 % bolus 1,000 mL (0 mLs Intravenous Stopped 12/31/22 0236)  ondansetron (ZOFRAN) injection 4 mg (4 mg Intravenous Given 12/31/22 0056)  morphine (PF) 4  MG/ML injection 4 mg (4 mg Intravenous Given 12/31/22 0056)  sodium chloride 0.9 % bolus 1,000 mL (0 mLs Intravenous Stopped 12/31/22 0444)    ED Course/ Medical Decision Making/ A&P Clinical Course as of 12/31/22 0658  Tue Dec 31, 2022  0551 Spoke with the patient's wife.  We discussed lab work and imaging.  He is now tolerating small sips of fluid.  He would like to go home.  He does not have anything for nausea at home.  Will discharge with Zofran. [CH]  [phone] Had a long discussion with the patient.  He is concerned about how much care his wife is having to provide for him.  I am placing home health evaluations for him.  He states that he has previously been evaluated by hospice and palliative care but had a poor experience. [CH]    Clinical Course User Index [CH] Tresean Mattix, Mayer Masker, MD                             Medical Decision Making Amount and/or Complexity of Data Reviewed Labs: ordered. Radiology: ordered.  Risk Prescription drug management.   This patient presents to the ED for concern of vomiting and diarrhea, this involves an extensive number of treatment options, and is a complaint that carries with it a high risk of complications and morbidity.  I considered the following differential and admission for this acute, potentially life threatening condition.  The differential diagnosis includes gastroenteritis, gastritis, colitis, SBO  MDM:    This is a 67 year old male with stage IV cancer who presents with vomiting and diarrhea.  He is cachectic and chronically ill-appearing.  Otherwise nontoxic.  Clinically appears dry.  He was given fluids and nausea medication.  Labs obtained.  Labs are largely at baseline.  He has chronic hyponatremia.  His potassium and magnesium are actually improved when compared to prior.  CT scan does not show any acute process.  Highly suspect a gastroenteritis.  He was given a total of 2 L of fluid and multiple doses of antiemetic.  He was resting  comfortably.  He has been able to sip on fluids.  Discussed with him and his wife supportive measures at home.  (Labs, imaging, consults)  Labs: I Ordered, and personally interpreted labs.  The pertinent results include: CBC,  CMP, lipase  Imaging Studies ordered: I ordered imaging studies including CT abdomen pelvis I independently visualized and interpreted imaging. I agree with the radiologist interpretation  Additional history obtained from wife.  External records from outside source obtained and reviewed including prior evaluations  Cardiac Monitoring: The patient was maintained on a cardiac monitor.  If on the cardiac monitor, I personally viewed and interpreted the cardiac monitored which showed an underlying rhythm of: Sinus rhythm  Reevaluation: After the interventions noted above, I reevaluated the patient and found that they have :improved  Social Determinants of Health:  lives independently  Disposition: Discharge  Co morbidities that complicate the patient evaluation  Past Medical History:  Diagnosis Date   Allergy    Anemia    Anginal pain (HCC)    Blood transfusion without reported diagnosis    CAD (coronary artery disease)    BMS to RCA 2007. Low risk nuclear stress 2020.   CAP (community acquired pneumonia) 09/2016   COPD (chronic obstructive pulmonary disease) (HCC)    Dyspnea    occasionally   Emphysema of lung (HCC)    GERD (gastroesophageal reflux disease)    Heart murmur    "I was told I had one when I was a kid"   Hemorrhoids    History of anal fissures    "no surgeries" (10/30/2016)   Hyperlipidemia    Hypertension    lung ca dx'd 08/2018   with mets to bones in arms   Myocardial infarction [facility])    Peripheral arterial disease (HCC)    status post right common iliac artery stenting back in 2007   Seasonal allergies    Tobacco abuse      Medicines Meds ordered this encounter  Medications   sodium chloride 0.9 % bolus 1,000 mL    ondansetron (ZOFRAN) injection 4 mg   morphine (PF) 4 MG/ML injection 4 mg   promethazine (PHENERGAN) 6.25 mg in sodium chloride 0.9 % 50 mL IVPB   sodium chloride 0.9 % bolus 1,000 mL   ondansetron (ZOFRAN) 4 MG tablet    Sig: Take 1 tablet (4 mg total) by mouth every 8 (eight) hours as needed for nausea or vomiting.    Dispense:  20 tablet    Refill:  0    I have reviewed the patients home medicines and have made adjustments as needed  Problem List / ED Course: Problem List Items Addressed This Visit       Digestive   Nausea vomiting and diarrhea - Primary   Other Visit Diagnoses     Dehydration       Chronic hyponatremia                       Final Clinical Impression(s) / ED Diagnoses Final diagnoses:  Nausea vomiting and diarrhea  Dehydration  Chronic hyponatremia    Rx / DC Orders ED Discharge Orders          Ordered    ondansetron (ZOFRAN) 4 MG tablet  Every 8 hours PRN        12/31/22 0627    Home Health        12/31/22 0657    Face-to-face encounter (required for Medicare/Medicaid patients)       Comments: I Mayer Masker Freja [name] certify that this patient is under my care and that I, or a nurse practitioner or physician's assistant working with me, had a face-to-face encounter that meets the physician face-to-face encounter requirements with  this patient on 12/31/2022. The encounter with the patient was in whole, or in part for the following medical condition(s) which is the primary reason for home health care (List medical condition): terminal cancer, deconditioning   12/31/22 0657              Shon Baton, MD 12/31/22 0630    Shon Baton, MD 12/31/22 [phone]

## 2022-12-31 NOTE — ED Notes (Signed)
Patient still having episodes of vomiting. When discussing PO challenge, patient stated he did not think he could eat anything, but cup of ginger ale given.

## 2022-12-31 NOTE — Discharge Instructions (Signed)
You were seen today for vomiting and diarrhea.  You have chronic low sodium.  Make sure that you are drinking electrolyte rich fluids.  You will be sent home with a prescription for Zofran.  Follow-up with your primary doctor.

## 2023-01-02 ENCOUNTER — Telehealth: Payer: Self-pay | Admitting: Adult Health

## 2023-01-02 NOTE — Telephone Encounter (Signed)
Please advise 

## 2023-01-02 NOTE — Telephone Encounter (Signed)
[name] notified of update

## 2023-01-02 NOTE — Telephone Encounter (Signed)
Pt discharged from hospital the morning of 12/31/22. Spouse says he is in no condition to physically come into the office and asking if they could switch their hospital f/u to virtual. Says he needs refills and would like to discuss hospice/home care.

## 2023-01-03 ENCOUNTER — Telehealth (INDEPENDENT_AMBULATORY_CARE_PROVIDER_SITE_OTHER): Payer: BC Managed Care – PPO | Admitting: Adult Health

## 2023-01-03 ENCOUNTER — Encounter: Payer: Self-pay | Admitting: Adult Health

## 2023-01-03 ENCOUNTER — Telehealth: Payer: Self-pay

## 2023-01-03 ENCOUNTER — Other Ambulatory Visit: Payer: Self-pay

## 2023-01-03 VITALS — Ht 67.0 in | Wt 84.6 lb

## 2023-01-03 DIAGNOSIS — Z515 Encounter for palliative care: Secondary | ICD-10-CM

## 2023-01-03 DIAGNOSIS — G894 Chronic pain syndrome: Secondary | ICD-10-CM | POA: Diagnosis not present

## 2023-01-03 DIAGNOSIS — C3491 Malignant neoplasm of unspecified part of right bronchus or lung: Secondary | ICD-10-CM

## 2023-01-03 MED ORDER — OXYCODONE HCL 5 MG PO TABS
5.0000 mg | ORAL_TABLET | Freq: Three times a day (TID) | ORAL | 0 refills | Status: AC
Start: 2023-01-03 — End: 2023-02-02

## 2023-01-03 MED ORDER — ONDANSETRON HCL 4 MG PO TABS: 4.0000 mg | ORAL_TABLET | Freq: Three times a day (TID) | ORAL | 0 refills | Status: DC | PRN

## 2023-01-03 NOTE — Progress Notes (Deleted)
   Subjective:    Patient ID: Sean Rose, male    DOB: Apr 03, 1956, 67 y.o.   MRN: [id number]  HPI  67 year old male who  has a past medical history of Allergy, Anemia, Anginal pain (HCC), Blood transfusion without reported diagnosis, CAD (coronary artery disease), CAP (community acquired pneumonia) (09/2016), COPD (chronic obstructive pulmonary disease) (HCC), Dyspnea, Emphysema of lung (HCC), GERD (gastroesophageal reflux disease), Heart murmur, Hemorrhoids, History of anal fissures, Hyperlipidemia, Hypertension, lung ca (dx'd 08/2018), Myocardial infarction [facility]), Peripheral arterial disease (HCC), Seasonal allergies, and Tobacco abuse.  He was seen in the emergency room 3 days ago with vomiting and diarrhea.  He has stage IV lung cancer not currently undergoing any chemotherapy treatments.  He was given IV fluids and Zofran in the emergency room.    Lab work was largely at baseline.  CT scan did not show any acute process.   Review of Systems     Objective:   Physical Exam        Assessment & Plan:

## 2023-01-03 NOTE — Patient Instructions (Signed)
Visit Information  Thank you for taking time to visit with me today. Please don't hesitate to contact me if I can be of assistance to you.   Following are the goals we discussed today:   Goals Addressed             This Visit's Progress    Lung cancer       Care Coordination Interventions: Evaluation of current treatment plan related to Lung cancer and patient's adherence to plan as established by provider Discussed plans with patient for ongoing care management follow up and provided patient with direct contact information for care management team Discussed Hospice/pallative support. Reviewed upcoming provider appointments and treatment appointments Assessed support system. Has consistent/reliable family or other support: Yes Patient has follow up with PCP today to discuss palliative/hospice Active listening / Reflection utilized  Emotional Support Provided         Our next appointment is by telephone on 01/10/23 at 1100  Please call the care guide team at [phone] if you need to cancel or reschedule your appointment.   If you are experiencing a Mental Health or Behavioral Health Crisis or need someone to talk to, please call the Suicide and Crisis Lifeline: 988   Patient verbalizes understanding of instructions and care plan provided today and agrees to view in MyChart. Active MyChart status and patient understanding of how to access instructions and care plan via MyChart confirmed with patient.     The patient has been provided with contact information for the care management team and has been advised to call with any health related questions or concerns.   Bary Leriche, RN, MSN [facility] Care Management Care Management Coordinator Direct Line [phone]

## 2023-01-03 NOTE — Progress Notes (Signed)
Virtual Visit via Video Note  I connected with Sean Rose  on 01/03/23 at  2:00 PM EDT by a video enabled telemedicine application and verified that I am speaking with the correct person using two identifiers.  Location patient: home Location provider:work or home office Persons participating in the virtual visit: patient, provider  I discussed the limitations of evaluation and management by telemedicine and the availability of in person appointments. The patient expressed understanding and agreed to proceed.   HPI:  He was seen in the emergency room 3 days ago with vomiting and diarrhea.  He has stage IV lung cancer not currently undergoing any chemotherapy treatments.  He was given IV fluids and Zofran in the emergency room.    Lab work was largely at baseline.  CT scan did not show any acute process.  He is feeling better. He has not had any more vomiting and diarrhea.   He would also like a referral to Palliative care for help in managing his symptoms.   He also needs a refill of Roxicodone that he takes for chronic pain from cancer.   ROS: See pertinent positives and negatives per HPI.  Past Medical History:  Diagnosis Date   Allergy    Anemia    Anginal pain (HCC)    Blood transfusion without reported diagnosis    CAD (coronary artery disease)    BMS to RCA 2007. Low risk nuclear stress 2020.   CAP (community acquired pneumonia) 09/2016   COPD (chronic obstructive pulmonary disease) (HCC)    Dyspnea    occasionally   Emphysema of lung (HCC)    GERD (gastroesophageal reflux disease)    Heart murmur    "I was told I had one when I was a kid"   Hemorrhoids    History of anal fissures    "no surgeries" (10/30/2016)   Hyperlipidemia    Hypertension    lung ca dx'd 08/2018   with mets to bones in arms   Myocardial infarction [facility])    Peripheral arterial disease (HCC)    status post right common iliac artery stenting back in 2007   Seasonal allergies    Tobacco abuse      Past Surgical History:  Procedure Laterality Date   ANKLE SURGERY Left    "rebuilt it"   ANTERIOR CRUCIATE LIGAMENT REPAIR Right    CARDIAC CATHETERIZATION  09/23/2008   Continued medical therapy - may need GI evaluation in addition.   CARDIAC CATHETERIZATION  10/28/2007   Medical therapy recommended.   CARDIAC CATHETERIZATION  11/18/2006   In-stent restenosis RCA  (50% distal edge, 80% segmental mid, and 50-60% segmental proximal). Successful cutting balloon atherectomy using a [id number] cutting balloon. 3 inflations with atherectomy performed on mid and proximal portions resulting in reduction of 80% mid in-stent restenosis to less than 20% residual and 50-60% segmental proximal to less than 20% residual without dissection.   CARDIAC CATHETERIZATION  02/26/2006   Severe stenosis in RCA. Stenting performed using IVUS. 3.5x20 Maverick balloon deployed at [employer]. Distal stent-a 4x28 Liberte stent-deployed 12atm 48sec, 12atm 31sec, 4atm 19sec. Mid stent-a 4x28 Liberte stent-deployed 14atm 45sec, 14atm 60sec, 14atm 44sec. Proximal stent-4x8 Liberte- 14atm 45sec,14atm 47sec, 16atm 43sec. Severely diseased segment then appeared TIMI-3 flow.   CARDIOVASCULAR STRESS TEST  11/17/2012   No significant ECG changes. Septal perfusion defect is new when complared to study from 2010. Abnormal myocardial perfusion imaging with a basal to mid perfusion suggestive of previous MI.   CAROTID DOPPLER  08/09/2011   Bilateral Bulb/Proximal ICA - demonstrated a mild amount of fibrous plaque without evidence of significant diameter reduction reduction or other vascular abnormality.   CHEST TUBE INSERTION Right 09/02/2018   Procedure: Chest Tube Insertion;  Surgeon: Josephine Igo, DO;  Location: [facility] OR;  Service: Thoracic;  Laterality: Right;   COLONOSCOPY     2003, 2014   CORONARY ANGIOPLASTY WITH STENT PLACEMENT     FEMORAL ARTERY STENT     INGUINAL HERNIA REPAIR Right    IR IMAGING GUIDED PORT INSERTION  11/13/2020    KNEE ARTHROSCOPY Right "multiple"   LAPAROSCOPIC APPENDECTOMY N/A 12/07/2018   Procedure: APPENDECTOMY LAPAROSCOPIC;  Surgeon: Abigail Miyamoto, MD;  Location: [facility] ORS;  Service: General;  Laterality: N/A;   LOWER EXTREMITY ARTERIAL DOPPLER  01/31/2011   Bilateral ABIs-normal values with no suggestion of arterial insuff to the lower extremities at rest. 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 Right common iliac artery with an 8.0x18 mm Herculink stent deployed at 12 atm. Stenosis was reduced from 80% to 0% with brisk flow. b. I-cast stenting to left common iliac artery   PERIPHERAL VASCULAR CATHETERIZATION N/A 04/13/2015   Procedure: Lower Extremity Angiography;  Surgeon: Runell Gess, MD; L-oCIA 75%, 40-50% L-EIA, R-CIA stent patent, s/p 8 mm x 38 mm ICast covered stent>>0% stenosis in L-oCIA      SHOULDER ARTHROSCOPY WITH ROTATOR CUFF REPAIR Right    TRANSTHORACIC ECHOCARDIOGRAM  11/26/2012   EF not noted. Aortic valve-sclerosis without stenosis, no regurgiation.    UPPER GASTROINTESTINAL ENDOSCOPY     US CAROTID DOPPLER BILATERAL ([facility] HX)  08/09/2011   Bilateral Bulb/Proximal ICAa demonstrated a mild amount of fibrous plaque without evidence of significant diameter reduction or any other vascular abnormality.   VIDEO ASSISTED THORACOSCOPY (VATS)/ LOBECTOMY Right 09/14/2018   Procedure: RIGHT VIDEO ASSISTED THORACOSCOPY (VATS)/ RIGHT UPPER LOBECTOMY;  Surgeon: Loreli Slot, MD;  Location: [facility] OR;  Service: Thoracic;  Laterality: Right;   VIDEO BRONCHOSCOPY WITH ENDOBRONCHIAL NAVIGATION N/A 09/02/2018   Procedure: VIDEO BRONCHOSCOPY WITH ENDOBRONCHIAL NAVIGATION;  Surgeon: Josephine Igo, DO;  Location: [facility] OR;  Service: Thoracic;  Laterality: N/A;   VIDEO BRONCHOSCOPY WITH INSERTION OF INTERBRONCHIAL VALVE (IBV) N/A 07/10/2022    Procedure: VIDEO BRONCHOSCOPY WITH INSERTION OF INTERBRONCHIAL VALVE (IBV);  Surgeon: Loreli Slot, MD;  Location: [facility] OR;  Service: Thoracic;  Laterality: N/A;   VIDEO BRONCHOSCOPY WITH INSERTION OF INTERBRONCHIAL VALVE (IBV) N/A 09/06/2022   Procedure: VIDEO BRONCHOSCOPY WITH REMOVAL OF INTRABRONCHIAL VALVE (IBV);  Surgeon: Loreli Slot, MD;  Location: [facility] OR;  Service: Thoracic;  Laterality: N/A;    Family History  Problem Relation Age of Onset   Colon cancer Mother    Heart disease Father    Heart disease Paternal Grandfather    Rectal cancer Other    Esophageal cancer Neg Hx    Stomach cancer Neg Hx        Current Outpatient Medications:    acetaminophen (TYLENOL) 500 MG tablet, Take 500 mg by mouth as needed for moderate pain or mild pain., Disp: , Rfl:    amLODipine (NORVASC) 5 MG tablet, TAKE 1 TABLET BY MOUTH DAILY, Disp: 180 tablet, Rfl: 0   aspirin EC 81 MG tablet, Take 81 mg by mouth daily., Disp: , Rfl:    cetirizine (ZYRTEC) 10 MG  tablet, Take 10 mg by mouth at bedtime., Disp: , Rfl:    clopidogrel (PLAVIX) 75 MG tablet, TAKE 1 TABLET BY MOUTH EVERY DAY, Disp: 90 tablet, Rfl: 1   esomeprazole (NEXIUM) 20 MG capsule, Take 40 mg by mouth daily., Disp: , Rfl:    Magnesium 500 MG TABS, Take 1 tablet (500 mg total) by mouth in the morning and at bedtime., Disp: , Rfl:    metoprolol succinate (TOPROL-XL) 25 MG 24 hr tablet, TAKE 1 TABLET BY MOUTH DAILY, Disp: 90 tablet, Rfl: 1   mirtazapine (REMERON) 15 MG tablet, TAKE 1 TABLET(15 MG) BY MOUTH AT BEDTIME (Patient taking differently: Take 15 mg by mouth at bedtime.), Disp: 90 tablet, Rfl: 0   Multiple Vitamin (MULTIVITAMIN WITH MINERALS) TABS tablet, Take 1 tablet by mouth daily., Disp: , Rfl:    ondansetron (ZOFRAN) 4 MG tablet, Take 1 tablet (4 mg total) by mouth every 8 (eight) hours as needed for nausea or vomiting., Disp: 20 tablet, Rfl: 0   rosuvastatin (CRESTOR) 5 MG tablet, TAKE 1 TABLET(5 MG) BY MOUTH EVERY  OTHER DAY (Patient taking differently: Take 5 mg by mouth every other day.), Disp: 45 tablet, Rfl: 1   sodium chloride 1 g tablet, Take 1 g by mouth daily., Disp: , Rfl:    TRELEGY ELLIPTA 100-62.5-25 MCG/ACT AEPB, INHALE 1 PUFF INTO THE LUNGS DAILY, Disp: 60 each, Rfl: 3 No current facility-administered medications for this visit.  Facility-Administered Medications Ordered in Other Visits:    diphenhydrAMINE (BENADRYL) 25 mg capsule, , , ,   EXAM:  VITALS per patient if applicable:  GENERAL: alert, oriented, appears chronically ill appearing   HEENT: atraumatic, conjunttiva clear, no obvious abnormalities on inspection of external nose and ears  NECK: normal movements of the head and neck  LUNGS: on inspection no signs of respiratory distress, breathing rate appears normal, no obvious gross SOB, gasping or wheezing  CV: no obvious cyanosis  MS: moves all visible extremities without noticeable abnormality  PSYCH/NEURO: pleasant and cooperative, no obvious depression or anxiety, speech and thought processing grossly intact  ASSESSMENT AND PLAN:  Discussed the following assessment and plan:  1. Adenocarcinoma of right lung, stage 3 (HCC) - Referral to Palliative care. I do not believe he is within the 6 month window at this time - Amb Referral to Palliative Care  2. Chronic pain syndrome  - oxyCODONE (ROXICODONE) 5 MG immediate release tablet; Take 1 tablet (5 mg total) by mouth every 8 (eight) hours.  Dispense: 90 tablet; Refill: 0 - ondansetron (ZOFRAN) 4 MG tablet; Take 1 tablet (4 mg total) by mouth every 8 (eight) hours as needed for nausea or vomiting.  Dispense: 20 tablet; Refill: 0      I discussed the assessment and treatment plan with the patient. The patient was provided an opportunity to ask questions and all were answered. The patient agreed with the plan and demonstrated an understanding of the instructions.   The patient was advised to call back or seek an  in-person evaluation if the symptoms worsen or if the condition fails to improve as anticipated.   [name], NP   Time spent with patient today was 32 minutes which consisted of chart review, discussing lung cancer, chronic pain and home comfort measures, treatment answering questions and documentation.

## 2023-01-03 NOTE — Patient Outreach (Signed)
  Care Coordination   Follow Up Visit Note   01/03/2023 Name: Sean Rose MRN: [id number] DOB: November 24, 1955  Sean Rose is a 67 y.o. year old male who sees Sean Rose, Sean Keen, NP for primary care. I  spoke with spouse Sean Rose,. Patient with recent ED visit.  PCP follow up today to discuss palliative/hospice care.    What matters to the patients health and wellness today?  Possible palliative/hospice care.    Goals Addressed             This Visit's Progress    Lung cancer       Care Coordination Interventions: Evaluation of current treatment plan related to Lung cancer and patient's adherence to plan as established by provider Discussed plans with patient for ongoing care management follow up and provided patient with direct contact information for care management team Discussed Hospice/pallative support. Reviewed upcoming provider appointments and treatment appointments Assessed support system. Has consistent/reliable family or other support: Yes Patient has follow up with PCP today to discuss palliative/hospice Active listening / Reflection utilized  Emotional Support Provided         SDOH assessments and interventions completed:  Yes     Care Coordination Interventions:  Yes, provided   Follow up plan: Follow up call scheduled for June 14 th    Encounter Outcome:  Pt. Visit Completed   Sean Leriche, RN, MSN [facility] Care Management Care Management Coordinator Direct Line [phone]

## 2023-01-07 ENCOUNTER — Other Ambulatory Visit: Payer: Self-pay

## 2023-01-07 ENCOUNTER — Telehealth: Payer: Self-pay | Admitting: Adult Health

## 2023-01-07 VITALS — BP 118/82 | HR 84 | Resp 18

## 2023-01-07 DIAGNOSIS — Z515 Encounter for palliative care: Secondary | ICD-10-CM

## 2023-01-07 NOTE — Progress Notes (Signed)
COMMUNITY PALLIATIVE CARE SW NOTE  PATIENT NAME: Sean Rose DOB: May 23, 1956 MRN: [id number]  PRIMARY CARE PROVIDER: [name], NP  RESPONSIBLE PARTY:  Acct ID - Guarantor Home Phone Work Phone Relationship Acct Type  [id number] BRAD, LIEURANCE* [phone]  Self P/F     [address] [place] [address]   Initial Palliative Care Encounter/Clinical Social Work  [employer] SW completed a visit with patient's wife-[name]. SW provided an introduction and education regarding palliative care. She provided a verbal consent to services and provided a brief status update on patient.   She report that patient is very weak. She has lost a tremendous amount of weight and is currently down to 85 lbs. He had a recent ER visit. He ambulates in a wheelchair which is a change from last month. He has persistent  shoulder pain and generalized pain in general. His pain medication has caused constipation with patient. His wife's goal is for patient to avoid any ER visits.   SW scheduled a follow-up appointment for 01/07/23 @ 1pm.    Social History   Tobacco Use   Smoking status: Former    Packs/day: 0.25    Years: 38.00    Additional pack years: 0.00    Total pack years: 9.50    Types: Cigarettes    Quit date: 10/19/2018    Years since quitting: 4.2   Smokeless tobacco: Never   Tobacco comments:    pack per day 12.17.19  Substance Use Topics   Alcohol use: Not Currently    Comment: socially    CODE STATUS: To be assessed ADVANCED DIRECTIVES: No MOST FORM COMPLETE: No HOSPICE EDUCATION PROVIDED: No   Duration of encounter and documentation: 30 minutes  [employer], LCSW

## 2023-01-07 NOTE — Telephone Encounter (Signed)
Taking stool softener with each oxycontin as directed. Still constipated, asking if he should take a laxative

## 2023-01-08 NOTE — Telephone Encounter (Signed)
Please advise 

## 2023-01-08 NOTE — Telephone Encounter (Signed)
Pt's spouse called to F/U. CMA was with a patient. Pt asked that CMA call back at her earliest convenience.

## 2023-01-09 NOTE — Telephone Encounter (Signed)
Patient spouse notified of update  and verbalized understanding. 

## 2023-01-09 NOTE — Telephone Encounter (Signed)
Left message to return phone call.

## 2023-01-09 NOTE — Telephone Encounter (Signed)
Pt wife notified of update and stated she got Miralax and will give it to the pt daily.

## 2023-01-09 NOTE — Telephone Encounter (Signed)
Pt wife is calling to clarify the pt need to take miralax and dulcolax

## 2023-01-10 ENCOUNTER — Ambulatory Visit: Payer: Self-pay

## 2023-01-10 NOTE — Patient Instructions (Signed)
Visit Information  Thank you for taking time to visit with me today. Please don't hesitate to contact me if I can be of assistance to you.   Following are the goals we discussed today:   Goals Addressed             This Visit's Progress    Lung cancer       Care Coordination Interventions: Evaluation of current treatment plan related to Lung cancer and patient's adherence to plan as established by provider Discussed plans with patient for ongoing care management follow up and provided patient with direct contact information for care management team Patient involved with palliative care now. Reviewed upcoming provider appointments and treatment appointments Assessed support system. Has consistent/reliable family or other support: Yes Patient has follow up with PCP today to discuss palliative/hospice Active listening / Reflection utilized  Emotional Support Provided  Spoke with spouse [name] she states patient about the same. However patient having problems with constipation due to opoid use.  Patient using miralax and dulcolax at this time.  Palliative care involved now to assist with symptom management. Patient no longer in treatment for cancer.         Our next appointment is by telephone on 02/06/23 at 1200 pm  Please call the care guide team at [phone] if you need to cancel or reschedule your appointment.   If you are experiencing a Mental Health or Behavioral Health Crisis or need someone to talk to, please call the Suicide and Crisis Lifeline: 988   Patient verbalizes understanding of instructions and care plan provided today and agrees to view in MyChart. Active MyChart status and patient understanding of how to access instructions and care plan via MyChart confirmed with patient.     The patient has been provided with contact information for the care management team and has been advised to call with any health related questions or concerns.   Bary Leriche, RN,  MSN [facility] Care Management Care Management Coordinator Direct Line [phone]

## 2023-01-10 NOTE — Patient Outreach (Signed)
  Care Coordination   Follow Up Visit Note   01/10/2023 Name: Sean Rose MRN: [id number] DOB: Oct 27, 1955  Sean Rose is a 67 y.o. year old male who sees Nafziger, Kandee Keen, NP for primary care. I  spoke with spouse Sean Rose by phone today.  What matters to the patients health and wellness today?  Maintaining     Goals Addressed             This Visit's Progress    Lung cancer       Care Coordination Interventions: Evaluation of current treatment plan related to Lung cancer and patient's adherence to plan as established by provider Discussed plans with patient for ongoing care management follow up and provided patient with direct contact information for care management team Patient involved with palliative care now. Reviewed upcoming provider appointments and treatment appointments Assessed support system. Has consistent/reliable family or other support: Yes Patient has follow up with PCP today to discuss palliative/hospice Active listening / Reflection utilized  Emotional Support Provided  Spoke with spouse Sean Rose she states patient about the same. However patient having problems with constipation due to opoid use.  Patient using miralax and dulcolax at this time.  Palliative care involved now to assist with symptom management. Patient no longer in treatment for cancer.         SDOH assessments and interventions completed:  Yes     Care Coordination Interventions:  Yes, provided   Follow up plan: Follow up call scheduled for July    Encounter Outcome:  Pt. Visit Completed   Bary Leriche, RN, MSN [facility] Care Management Care Management Coordinator Direct Line [phone]

## 2023-01-12 NOTE — Progress Notes (Signed)
1247 Palliative Care Encounter Note   PATIENT NAME: Sean Rose DOB: 28-Aug-1955 MRN: [id number]  PRIMARY CARE PROVIDER: [name], NP  RESPONSIBLE PARTY:  Acct ID - Guarantor Home Phone Work Phone Relationship Acct Type  [id number] Sean Rose, Sean Rose* [phone]  Self P/F     [address] [place] [address]   RN completed home visit. Wife also present   HISTORY OF PRESENT ILLNESS:  67 year old male with a history of stage IV lung cancer w/ bone and lymph mets, HTN, PVD, COPD, hemorrhoids, and GERD   Pt reports "being hospitalized about 9 times in the last 6 months. From bad falls, spontaneous pneumos, pneumonia. Diagnosed in jan 2020. Underwent treatment . Now gets scanned every 4 months.  Socially: lives with wife in single story home. Has two sons both local. Medically retired.   Cognitive: Pt alert and oriented x3, appropriately answering questions and    Appetite: Wife states, "he is not eating a lot". Pt reports that he eats a good breakfast, omelet and grits. Then will have a couple of bowls of cereal or sandwiches. "I know I need to eat more but one of the problem is that I have bad teeth. I would devour a big mac right now if I could. Drinks about 150 ounces of Gatorade/tea/water per day.Weight down to 84 lbs.    Mobility: Pt ambulates short distances with rollator. Very weak.  rollator, travel wheel chair, bsc, cane, shower bench. Could use a ramp outside. Ask SW about community resources. Pt interested in physical therapy to get stronger.   RN to request home health PT order for [facility].   Sleeping Pattern: Since last hospitalization, pt has been sleeping in recliner.  Pain: c/o shoulder pain. Takes oxy 5 three times per day.   Gi/GU: Has been having issues with constipation. Taking stool softeners.   Palliative Care/ Hospice: RN explained role and purpose of palliative care including visit frequency. Also discussed benefits of hospice care as well as the  differences between the two with patient.  Pt states, "I am not ready to die. I do not want to give up  Goals of Care: Live as long as I can and get stronger.    CODE STATUS: Full Code ADVANCED DIRECTIVES: N MOST FORM: No (Forms left for pt to look at) PPS: 40%  Next appt scheduled: Monica and Dee to call to schedule follow up.     PHYSICAL EXAM:   VITALS: Today's Vitals   01/07/23 1303  BP: 118/82  Pulse: 84  Resp: 18  SpO2: 95%  PainSc: 6   PainLoc: Shoulder    LUNGS: diminished in bases CARDIAC:  regular, no JVD EXTREMITIES: MAE x 4, no edema    Barbette Merino, RN

## 2023-01-17 ENCOUNTER — Telehealth: Payer: Self-pay

## 2023-01-17 NOTE — Telephone Encounter (Signed)
(  3:40 pm) PC SW returned call to patient's wife-[name] to schedule a follow-up appointment. Patient is scheduled for 02/07/23 @11 :30 am.

## 2023-01-28 ENCOUNTER — Other Ambulatory Visit: Payer: Self-pay | Admitting: Adult Health

## 2023-01-28 DIAGNOSIS — E43 Unspecified severe protein-calorie malnutrition: Secondary | ICD-10-CM

## 2023-01-31 ENCOUNTER — Telehealth: Payer: Self-pay | Admitting: Internal Medicine

## 2023-01-31 NOTE — Telephone Encounter (Signed)
Patient's spouse called to cancel his appointments, patient was made aware of cancelled appointments.

## 2023-02-04 ENCOUNTER — Telehealth: Payer: Self-pay | Admitting: Internal Medicine

## 2023-02-04 NOTE — Telephone Encounter (Signed)
Patient needed follow up appointments to be rescheduled due to CT scan appointment, patient is aware of these changes and upcoming appointments

## 2023-02-06 ENCOUNTER — Ambulatory Visit: Payer: Self-pay

## 2023-02-06 NOTE — Patient Outreach (Signed)
  Care Coordination   Follow Up Visit Note   02/06/2023 Name: Sean Rose MRN: [id number] DOB: 02-21-1956  Sean Rose is a 67 y.o. year old male who sees Sean Rose, Sean Keen, NP for primary care. I  spoke with spouse Sean Rose by phone today.   What matters to the patients health and wellness today?  Possible hospice discussion with palliative care.     Goals Addressed             This Visit's Progress    Lung cancer       Care Coordination Interventions: Evaluation of current treatment plan related to Lung cancer and patient's adherence to plan as established by provider Discussed plans with patient for ongoing care management follow up and provided patient with direct contact information for care management team Patient involved with palliative care now. Reviewed upcoming provider appointments and treatment appointments Assessed support system. Has consistent/reliable family or other support: Yes Patient has follow up with PCP today to discuss palliative/hospice Active listening / Reflection utilized  Emotional Support Provided  Spoke with spouse Sean Rose she states patient about the same. She reports patient is weaker, using the wheelchair for ambulation.  Palliative care visit on tomorrow and wife to discuss hospice.  Advised on hospice and benefits .  She voices no concerns.         SDOH assessments and interventions completed:  Yes     Care Coordination Interventions:  Yes, provided   Follow up plan: Follow up call scheduled for August    Encounter Outcome:  Pt. Visit Completed   Sean Leriche, RN, MSN [facility] Care Management Care Management Coordinator Direct Line [phone]

## 2023-02-06 NOTE — Patient Instructions (Signed)
Visit Information  Thank you for taking time to visit with me today. Please don't hesitate to contact me if I can be of assistance to you.   Following are the goals we discussed today:   Goals Addressed             This Visit's Progress    Lung cancer       Care Coordination Interventions: Evaluation of current treatment plan related to Lung cancer and patient's adherence to plan as established by provider Discussed plans with patient for ongoing care management follow up and provided patient with direct contact information for care management team Patient involved with palliative care now. Reviewed upcoming provider appointments and treatment appointments Assessed support system. Has consistent/reliable family or other support: Yes Patient has follow up with PCP today to discuss palliative/hospice Active listening / Reflection utilized  Emotional Support Provided  Spoke with spouse [name] she states patient about the same. She reports patient is weaker, using the wheelchair for ambulation.  Palliative care visit on tomorrow and wife to discuss hospice.  Advised on hospice and benefits .  She voices no concerns.         Our next appointment is by telephone on 03/06/23  at 1130 am  Please call the care guide team at [phone] if you need to cancel or reschedule your appointment.   If you are experiencing a Mental Health or Behavioral Health Crisis or need someone to talk to, please call the Suicide and Crisis Lifeline: 988   Patient verbalizes understanding of instructions and care plan provided today and agrees to view in MyChart. Active MyChart status and patient understanding of how to access instructions and care plan via MyChart confirmed with patient.     The patient has been provided with contact information for the care management team and has been advised to call with any health related questions or concerns.   Bary Leriche, RN, MSN [facility] Care Management Care  Management Coordinator Direct Line [phone]

## 2023-02-07 ENCOUNTER — Emergency Department (HOSPITAL_BASED_OUTPATIENT_CLINIC_OR_DEPARTMENT_OTHER)
Admission: EM | Admit: 2023-02-07 | Discharge: 2023-02-07 | Disposition: A | Discharge status: Home / Self Care | Payer: BC Managed Care – PPO | Attending: Emergency Medicine | Admitting: Emergency Medicine

## 2023-02-07 ENCOUNTER — Encounter (HOSPITAL_BASED_OUTPATIENT_CLINIC_OR_DEPARTMENT_OTHER): Payer: Self-pay | Admitting: Emergency Medicine

## 2023-02-07 ENCOUNTER — Other Ambulatory Visit: Payer: Self-pay

## 2023-02-07 ENCOUNTER — Telehealth: Payer: Self-pay | Admitting: Adult Health

## 2023-02-07 ENCOUNTER — Emergency Department (HOSPITAL_BASED_OUTPATIENT_CLINIC_OR_DEPARTMENT_OTHER): Payer: BC Managed Care – PPO | Admitting: Radiology

## 2023-02-07 DIAGNOSIS — R079 Chest pain, unspecified: Secondary | ICD-10-CM | POA: Insufficient documentation

## 2023-02-07 DIAGNOSIS — R0789 Other chest pain: Secondary | ICD-10-CM | POA: Diagnosis not present

## 2023-02-07 DIAGNOSIS — Z7982 Long term (current) use of aspirin: Secondary | ICD-10-CM | POA: Insufficient documentation

## 2023-02-07 DIAGNOSIS — R0602 Shortness of breath: Secondary | ICD-10-CM | POA: Diagnosis not present

## 2023-02-07 DIAGNOSIS — J439 Emphysema, unspecified: Secondary | ICD-10-CM | POA: Diagnosis not present

## 2023-02-07 LAB — CBC WITH DIFFERENTIAL/PLATELET
Abs Immature Granulocytes: 0.03 10*3/uL (ref 0.00–0.07)
Basophils Absolute: 0 10*3/uL (ref 0.0–0.1)
Basophils Relative: 0 %
Eosinophils Absolute: 0.1 10*3/uL (ref 0.0–0.5)
Eosinophils Relative: 1 %
HCT: 29.5 % — ABNORMAL LOW (ref 39.0–52.0)
Hemoglobin: 10 g/dL — ABNORMAL LOW (ref 13.0–17.0)
Immature Granulocytes: 0 %
Lymphocytes Relative: 24 %
Lymphs Abs: 2.2 10*3/uL (ref 0.7–4.0)
MCH: 31.8 pg (ref 26.0–34.0)
MCHC: 33.9 g/dL (ref 30.0–36.0)
MCV: 93.9 fL (ref 80.0–100.0)
Monocytes Absolute: 0.8 10*3/uL (ref 0.1–1.0)
Monocytes Relative: 9 %
Neutro Abs: 6 10*3/uL (ref 1.7–7.7)
Neutrophils Relative %: 66 %
Platelets: 474 10*3/uL — ABNORMAL HIGH (ref 150–400)
RBC: 3.14 MIL/uL — ABNORMAL LOW (ref 4.22–5.81)
RDW: 14.6 % (ref 11.5–15.5)
WBC: 9.2 10*3/uL (ref 4.0–10.5)
nRBC: 0 % (ref 0.0–0.2)

## 2023-02-07 LAB — COMPREHENSIVE METABOLIC PANEL
ALT: 8 U/L (ref 0–44)
AST: 16 U/L (ref 15–41)
Albumin: 3.6 g/dL (ref 3.5–5.0)
Alkaline Phosphatase: 78 U/L (ref 38–126)
Anion gap: 8 (ref 5–15)
BUN: 28 mg/dL — ABNORMAL HIGH (ref 8–23)
CO2: 26 mmol/L (ref 22–32)
Calcium: 9 mg/dL (ref 8.9–10.3)
Chloride: 92 mmol/L — ABNORMAL LOW (ref 98–111)
Creatinine, Ser: 1.25 mg/dL — ABNORMAL HIGH (ref 0.61–1.24)
GFR, Estimated: >60 mL/min (ref >60)
Glucose, Bld: 110 mg/dL — ABNORMAL HIGH (ref 70–99)
Potassium: 3.6 mmol/L (ref 3.5–5.1)
Sodium: 126 mmol/L — ABNORMAL LOW (ref 135–145)
Total Bilirubin: 0.4 mg/dL (ref 0.3–1.2)
Total Protein: 6.8 g/dL (ref 6.5–8.1)

## 2023-02-07 LAB — TROPONIN I (HIGH SENSITIVITY): Troponin I (High Sensitivity): 6 ng/L (ref <18)

## 2023-02-07 LAB — LIPASE, BLOOD: Lipase: 16 U/L (ref 11–51)

## 2023-02-07 MED ORDER — MORPHINE SULFATE (PF) 4 MG/ML IV SOLN
4.0000 mg | Freq: Once | INTRAVENOUS | Status: AC
  Administered 2023-02-07: 4 mg via INTRAVENOUS
  Filled 2023-02-07: qty 1

## 2023-02-07 MED ORDER — HEPARIN SOD (PORK) LOCK FLUSH 100 UNIT/ML IV SOLN
500.0000 [IU] | Freq: Once | INTRAVENOUS | Status: AC
  Administered 2023-02-07: 500 [IU]
  Filled 2023-02-07: qty 5

## 2023-02-07 MED ORDER — ONDANSETRON HCL 4 MG/2ML IJ SOLN
4.0000 mg | Freq: Once | INTRAMUSCULAR | Status: AC
  Administered 2023-02-07: 4 mg via INTRAVENOUS
  Filled 2023-02-07: qty 2

## 2023-02-07 MED ORDER — SODIUM CHLORIDE 0.9 % IV BOLUS
1000.0000 mL | Freq: Once | INTRAVENOUS | Status: AC
  Administered 2023-02-07: 1000 mL via INTRAVENOUS

## 2023-02-07 NOTE — ED Notes (Signed)
RT Stacy assessing in triage

## 2023-02-07 NOTE — ED Notes (Signed)
Discharge instructions, follow up care, and prescriptions reviewed and explained, pt verbalized understanding.  

## 2023-02-07 NOTE — ED Triage Notes (Signed)
Pt bib wheelchair, c/o shob x 2 days, worsening today, referred by Palliative care RN

## 2023-02-07 NOTE — ED Provider Notes (Signed)
[facility] AT [facility] Provider Note   CSN: [id number] Arrival date & time: 02/07/23  1452     History  Chief Complaint  Patient presents with   Shortness of Breath    Sean Rose is a 67 y.o. male.  67 yo M with a chief complaints of right-sided chest discomfort and feels like he cannot get a full breath.  He is worried that he has a spontaneous pneumothorax or pneumonia.  He has had this multiple times in the past.  He unfortunately has had a progressive decline of his medical health, is being seen by palliative care he has stage IV lung cancer and is not a candidate for chemotherapy.  Denies any fevers denies cough.  Denies trauma.   Shortness of Breath      Home Medications Prior to Admission medications   Medication Sig Start Date End Date Taking? Authorizing Provider  acetaminophen (TYLENOL) 500 MG tablet Take 500 mg by mouth as needed for moderate pain or mild pain.    [provider]  amLODipine (NORVASC) 5 MG tablet TAKE 1 TABLET BY MOUTH DAILY 06/24/22   Runell Gess, MD  aspirin EC 81 MG tablet Take 81 mg by mouth daily.    [provider]  cetirizine (ZYRTEC) 10 MG tablet Take 10 mg by mouth at bedtime.    [provider]  clopidogrel (PLAVIX) 75 MG tablet TAKE 1 TABLET BY MOUTH EVERY DAY 09/23/22   Runell Gess, MD  esomeprazole (NEXIUM) 20 MG capsule Take 40 mg by mouth daily.    [provider]  Magnesium 500 MG TABS Take 1 tablet (500 mg total) by mouth in the morning and at bedtime. 06/26/22   Gold, Deniece Portela E, PA-C  metoprolol succinate (TOPROL-XL) 25 MG 24 hr tablet TAKE 1 TABLET BY MOUTH DAILY 08/23/22   Runell Gess, MD  mirtazapine (REMERON) 15 MG tablet TAKE 1 TABLET(15 MG) BY MOUTH AT BEDTIME 01/29/23   Nafziger, Kandee Keen, NP  Multiple Vitamin (MULTIVITAMIN WITH MINERALS) TABS tablet Take 1 tablet by mouth daily.    [provider]  ondansetron (ZOFRAN) 4 MG tablet Take 1  tablet (4 mg total) by mouth every 8 (eight) hours as needed for nausea or vomiting. 01/03/23   Nafziger, Kandee Keen, NP  rosuvastatin (CRESTOR) 5 MG tablet TAKE 1 TABLET(5 MG) BY MOUTH EVERY OTHER DAY Patient taking differently: Take 5 mg by mouth every other day. 09/23/22   Runell Gess, MD  sodium chloride 1 g tablet Take 1 g by mouth daily.    [provider]  Dwyane Luo 100-62.5-25 MCG/ACT AEPB INHALE 1 PUFF INTO THE LUNGS DAILY 10/22/22   [name], NP      Allergies    Compazine [prochlorperazine] and Cyclobenzaprine    Review of Systems   Review of Systems  Respiratory:  Positive for shortness of breath.     Physical Exam Updated Vital Signs BP 114/85 (BP Location: Right Arm)   Pulse 71   Temp 98.2 F (36.8 C) (Oral)   Resp 18   Ht 5\' 7"  (1.702 m)   Wt 40.8 kg   SpO2 98%   BMI 14.10 kg/m  Physical Exam Vitals and nursing note reviewed.  Constitutional:      Appearance: He is well-developed.     Comments: Cachectic  Temporal muscle wasting  HENT:     Head: Normocephalic and atraumatic.  Eyes:     Pupils: Pupils are equal, round,  and reactive to light.  Neck:     Vascular: No JVD.  Cardiovascular:     Rate and Rhythm: Normal rate and regular rhythm.     Heart sounds: No murmur heard.    No friction rub. No gallop.  Pulmonary:     Effort: No respiratory distress.     Breath sounds: No wheezing.  Abdominal:     General: There is no distension.     Tenderness: There is no abdominal tenderness. There is no guarding or rebound.  Musculoskeletal:        General: Normal range of motion.     Cervical back: Normal range of motion and neck supple.  Skin:    Coloration: Skin is not pale.     Findings: No rash.  Neurological:     Mental Status: He is alert and oriented to person, place, and time.  Psychiatric:        Behavior: Behavior normal.     ED Results / Procedures / Treatments   Labs (all labs ordered are listed, but only abnormal  results are displayed) Labs Reviewed  CBC WITH DIFFERENTIAL/PLATELET - Abnormal; Notable for the following components:      Result Value   RBC 3.14 (*)    Hemoglobin 10.0 (*)    HCT 29.5 (*)    Platelets 474 (*)    All other components within normal limits  COMPREHENSIVE METABOLIC PANEL - Abnormal; Notable for the following components:   Sodium 126 (*)    Chloride 92 (*)    Glucose, Bld 110 (*)    BUN 28 (*)    Creatinine, Ser 1.25 (*)    All other components within normal limits  LIPASE, BLOOD  TROPONIN I (HIGH SENSITIVITY)    EKG EKG Interpretation Date/Time:  Friday February 07 2023 15:03:27 EDT Ventricular Rate:  116 PR Interval:  130 QRS Duration:  78 QT Interval:  252 QTC Calculation: 350 R Axis:   95  Text Interpretation: Rightward axis ST & T wave abnormality, consider anterolateral ischemia Abnormal ECG background noise TECHNICALLY DIFFICULT Otherwise no significant change Confirmed by Melene Plan [phone]) on 02/07/2023 3:07:10 PM  Radiology DG Chest 2 View  Result Date: 02/07/2023 CLINICAL DATA:  Shortness of breath EXAM: CHEST - 2 VIEW COMPARISON:  12/06/2022, 12/03/2022, 09/17/2022 FINDINGS: Right-sided central venous port tip at the cavoatrial region. Similar bilateral effusions and bibasilar airspace disease. Postsurgical changes in the right upper lung. Stable cardiomediastinal silhouette. No visible pneumothorax. Emphysematous disease IMPRESSION: Similar bilateral effusions and bibasilar airspace disease. Emphysema Electronically Signed   By: Jasmine Pang M.D.   On: 02/07/2023 15:51    Procedures Procedures    Medications Ordered in ED Medications  heparin lock flush 100 unit/mL (has no administration in time range)  sodium chloride 0.9 % bolus 1,000 mL (0 mLs Intravenous Stopped 02/07/23 1739)  morphine (PF) 4 MG/ML injection 4 mg (4 mg Intravenous Given 02/07/23 1634)  ondansetron (ZOFRAN) injection 4 mg (4 mg Intravenous Given 02/07/23 1636)    ED Course/  Medical Decision Making/ A&P                             Medical Decision Making Amount and/or Complexity of Data Reviewed Labs: ordered. Radiology: ordered.  Risk Prescription drug management.   67 yo M with a cc of right-sided chest pain and difficulty with deep inspiration.  He is worried that he has a spontaneous pneumothorax.  Has  had this in the past.  Chest x-ray was obtained without significant change to his prior pleural effusions.  I discussed this with the patient.  He is reassured, will obtain a laboratory evaluation.  I did offer CT imaging which he is currently declining.  Lab work largely at baseline.  His leukocytosis is actually improved from baseline.  His anemia is also mildly improved.  He has a chronic hyponatremia and hypochloremia that seems at baseline.  No significant changes renal function.  Troponins negative.  I discussed results with the patient and family.  Again offered CT imaging which she is declining.  Plans to follow-up with his PCP and with his oncologist.  6:42 PM:  I have discussed the diagnosis/risks/treatment options with the patient and family.  Evaluation and diagnostic testing in the emergency department does not suggest an emergent condition requiring admission or immediate intervention beyond what has been performed at this time.  They will follow up with PCP, oncology. We also discussed returning to the ED immediately if new or worsening sx occur. We discussed the sx which are most concerning (e.g., sudden worsening pain, fever, inability to tolerate by mouth) that necessitate immediate return. Medications administered to the patient during their visit and any new prescriptions provided to the patient are listed below.  Medications given during this visit Medications  heparin lock flush 100 unit/mL (has no administration in time range)  sodium chloride 0.9 % bolus 1,000 mL (0 mLs Intravenous Stopped 02/07/23 1739)  morphine (PF) 4 MG/ML injection  4 mg (4 mg Intravenous Given 02/07/23 1634)  ondansetron (ZOFRAN) injection 4 mg (4 mg Intravenous Given 02/07/23 1636)     The patient appears reasonably screen and/or stabilized for discharge and I doubt any other medical condition or other [facility] requiring further screening, evaluation, or treatment in the ED at this time prior to discharge.          Final Clinical Impression(s) / ED Diagnoses Final diagnoses:  Chest pain, unspecified type    Rx / DC Orders ED Discharge Orders     None         Melene Plan, DO 02/07/23 1842

## 2023-02-07 NOTE — Discharge Instructions (Signed)
Please return for worsening symptoms.  Please follow-up with your oncologist and PCP on Monday.

## 2023-02-07 NOTE — Telephone Encounter (Signed)
Called Sean Rose and he agreed with everything the palliative nurse stated. Sean Rose also noted some SOB and not being able to walk far. Sean Rose does have a hx of cancer and hx of collapse lung but fever and SOB main concern. I advised that we did not have any appts and to seek help at [facility] or Local UC. Sean Rose was not familiar with UC and what they offer but was familiar with [facility] and Location. I advised Sean Rose to go to [facility] Center. Sean Rose became a little confused and asked if a provider looked at note. I advised that Kandee Keen was out but I would route to a provider for advise.    Can you please assist in the absence of Kandee Keen?

## 2023-02-07 NOTE — Telephone Encounter (Addendum)
Sean Rose with [facility]  (Palliative care) [phone]   Currently with Pt  Concerns: Crackling on right side of lungs Temp is 99.2  (temporal)   Chest feeling constricted, difficulty taking deep breaths Trouble sleeping Sean Rose informed NP is OOO today and Monday  Call wife to ask what she would like done [phone]

## 2023-02-10 NOTE — Telephone Encounter (Signed)
Do you still need help with this?

## 2023-02-11 ENCOUNTER — Other Ambulatory Visit: Payer: BC Managed Care – PPO

## 2023-02-11 ENCOUNTER — Telehealth: Payer: Self-pay | Admitting: Adult Health

## 2023-02-11 MED ORDER — OXYCODONE HCL 5 MG PO TABS: 5.0000 mg | ORAL_TABLET | Freq: Four times a day (QID) | ORAL | 0 refills | Status: DC | PRN

## 2023-02-11 NOTE — Telephone Encounter (Signed)
 Left message to return phone call.

## 2023-02-11 NOTE — Telephone Encounter (Signed)
Okay for refill?  

## 2023-02-11 NOTE — Telephone Encounter (Signed)
Called pt no answer. Look like pt was seen in the ED.

## 2023-02-11 NOTE — Telephone Encounter (Signed)
Prescription Request  02/11/2023  LOV: 11/27/2022  What is the name of the medication or equipment? oxycodone  Have you contacted your pharmacy to request a refill? No   Which pharmacy would you like this sent to?   [id number] [place], [place] - [address] AT [facility] OF [facility] [address] & [facility] [address] [place] [address] Phone: [phone] Fax: [phone]  Patient notified that their request is being sent to the clinical staff for review and that they should receive a response within 2 business days.   Please advise at Mobile [phone] (mobile)

## 2023-02-11 NOTE — Telephone Encounter (Signed)
Thank you Dr.Barbara I will route to [facility] as he is in the office now.

## 2023-02-12 ENCOUNTER — Other Ambulatory Visit: Payer: Self-pay

## 2023-02-12 DIAGNOSIS — G894 Chronic pain syndrome: Secondary | ICD-10-CM

## 2023-02-12 MED ORDER — ONDANSETRON HCL 4 MG PO TABS
4.0000 mg | ORAL_TABLET | Freq: Three times a day (TID) | ORAL | 0 refills | Status: DC | PRN
Start: 2023-02-12 — End: 2023-03-11

## 2023-02-12 MED ORDER — DOXYCYCLINE HYCLATE 100 MG PO CAPS: 100.0000 mg | ORAL_CAPSULE | Freq: Two times a day (BID) | ORAL | 0 refills | Status: AC

## 2023-02-12 NOTE — Telephone Encounter (Signed)
 Left message to return phone call.

## 2023-02-12 NOTE — Telephone Encounter (Signed)
 Patient spouse notified of update  and verbalized understanding. 

## 2023-02-12 NOTE — Telephone Encounter (Signed)
Please call lisa at [phone]

## 2023-02-13 ENCOUNTER — Ambulatory Visit: Payer: BC Managed Care – PPO | Admitting: Internal Medicine

## 2023-02-17 ENCOUNTER — Ambulatory Visit (HOSPITAL_COMMUNITY)
Admission: RE | Admit: 2023-02-17 | Discharge: 2023-02-17 | Disposition: A | Discharge status: Home / Self Care | Payer: BC Managed Care – PPO | Source: Ambulatory Visit | Attending: Physician Assistant | Admitting: Physician Assistant

## 2023-02-17 ENCOUNTER — Other Ambulatory Visit: Payer: Self-pay

## 2023-02-17 ENCOUNTER — Inpatient Hospital Stay: Payer: BC Managed Care – PPO | Attending: Internal Medicine

## 2023-02-17 DIAGNOSIS — D500 Iron deficiency anemia secondary to blood loss (chronic): Secondary | ICD-10-CM

## 2023-02-17 DIAGNOSIS — Z85118 Personal history of other malignant neoplasm of bronchus and lung: Secondary | ICD-10-CM | POA: Diagnosis not present

## 2023-02-17 DIAGNOSIS — Z95828 Presence of other vascular implants and grafts: Secondary | ICD-10-CM

## 2023-02-17 DIAGNOSIS — C3491 Malignant neoplasm of unspecified part of right bronchus or lung: Secondary | ICD-10-CM | POA: Diagnosis not present

## 2023-02-17 DIAGNOSIS — J432 Centrilobular emphysema: Secondary | ICD-10-CM | POA: Diagnosis not present

## 2023-02-17 DIAGNOSIS — C3490 Malignant neoplasm of unspecified part of unspecified bronchus or lung: Secondary | ICD-10-CM | POA: Diagnosis not present

## 2023-02-17 DIAGNOSIS — J929 Pleural plaque without asbestos: Secondary | ICD-10-CM | POA: Diagnosis not present

## 2023-02-17 LAB — CMP (CANCER CENTER ONLY)
ALT: 10 U/L (ref 0–44)
AST: 18 U/L (ref 15–41)
Albumin: 3.6 g/dL (ref 3.5–5.0)
Alkaline Phosphatase: 88 U/L (ref 38–126)
Anion gap: 5 (ref 5–15)
BUN: 25 mg/dL — ABNORMAL HIGH (ref 8–23)
CO2: 29 mmol/L (ref 22–32)
Calcium: 9.3 mg/dL (ref 8.9–10.3)
Chloride: 92 mmol/L — ABNORMAL LOW (ref 98–111)
Creatinine: 1.24 mg/dL (ref 0.61–1.24)
GFR, Estimated: >60 mL/min (ref >60)
Glucose, Bld: 95 mg/dL (ref 70–99)
Potassium: 4.1 mmol/L (ref 3.5–5.1)
Sodium: 126 mmol/L — ABNORMAL LOW (ref 135–145)
Total Bilirubin: 0.3 mg/dL (ref 0.3–1.2)
Total Protein: 6.8 g/dL (ref 6.5–8.1)

## 2023-02-17 LAB — CBC WITH DIFFERENTIAL (CANCER CENTER ONLY)
Abs Immature Granulocytes: 0.01 10*3/uL (ref 0.00–0.07)
Basophils Absolute: 0.1 10*3/uL (ref 0.0–0.1)
Basophils Relative: 1 %
Eosinophils Absolute: 0.2 10*3/uL (ref 0.0–0.5)
Eosinophils Relative: 2 %
HCT: 30.4 % — ABNORMAL LOW (ref 39.0–52.0)
Hemoglobin: 10.4 g/dL — ABNORMAL LOW (ref 13.0–17.0)
Immature Granulocytes: 0 %
Lymphocytes Relative: 32 %
Lymphs Abs: 2.9 10*3/uL (ref 0.7–4.0)
MCH: 32.2 pg (ref 26.0–34.0)
MCHC: 34.2 g/dL (ref 30.0–36.0)
MCV: 94.1 fL (ref 80.0–100.0)
Monocytes Absolute: 0.7 10*3/uL (ref 0.1–1.0)
Monocytes Relative: 8 %
Neutro Abs: 5.1 10*3/uL (ref 1.7–7.7)
Neutrophils Relative %: 57 %
Platelet Count: 391 10*3/uL (ref 150–400)
RBC: 3.23 MIL/uL — ABNORMAL LOW (ref 4.22–5.81)
RDW: 14.1 % (ref 11.5–15.5)
WBC Count: 8.9 10*3/uL (ref 4.0–10.5)
nRBC: 0 % (ref 0.0–0.2)

## 2023-02-17 MED ORDER — HEPARIN SOD (PORK) LOCK FLUSH 100 UNIT/ML IV SOLN
INTRAVENOUS | Status: AC
  Filled 2023-02-17: qty 5

## 2023-02-17 MED ORDER — IOHEXOL 9 MG/ML PO SOLN
500.0000 mL | ORAL | Status: AC
  Administered 2023-02-17: 1000 mL via ORAL

## 2023-02-17 MED ORDER — SODIUM CHLORIDE 0.9% FLUSH
10.0000 mL | Freq: Once | INTRAVENOUS | Status: AC
  Administered 2023-02-17: 10 mL

## 2023-02-17 MED ORDER — HEPARIN SOD (PORK) LOCK FLUSH 100 UNIT/ML IV SOLN
500.0000 [IU] | Freq: Once | INTRAVENOUS | Status: AC
  Administered 2023-02-17: 500 [IU] via INTRAVENOUS

## 2023-02-24 ENCOUNTER — Inpatient Hospital Stay: Payer: BC Managed Care – PPO | Admitting: Internal Medicine

## 2023-02-26 ENCOUNTER — Telehealth: Payer: Self-pay | Admitting: Adult Health

## 2023-02-26 NOTE — Telephone Encounter (Signed)
Pt wife is calling and would like new rx oxyCODONE (ROXICODONE) 10 mg instead of 5 mg. Pt is in a lot of pain

## 2023-02-28 NOTE — Telephone Encounter (Signed)
Calling to check on progress of this request 

## 2023-03-04 MED ORDER — OXYCODONE HCL 5 MG PO TABS: 10.0000 mg | ORAL_TABLET | ORAL | 0 refills | Status: DC | PRN

## 2023-03-04 NOTE — Telephone Encounter (Signed)
Please advise 

## 2023-03-04 NOTE — Telephone Encounter (Signed)
Wife called to say Pt has been struggling, doubling up on Rx and is now completely out.   Please send refill, as soon as possible.  [facility] #[id number] [place], [place] - [address] AT [facility] OF [address] & [facility] [address] Phone: [phone]  Fax: [phone]

## 2023-03-04 NOTE — Telephone Encounter (Signed)
Patient spouse notified of update  and verbalized understanding. 

## 2023-03-05 ENCOUNTER — Inpatient Hospital Stay: Payer: BC Managed Care – PPO | Attending: Internal Medicine | Admitting: Internal Medicine

## 2023-03-05 DIAGNOSIS — C3490 Malignant neoplasm of unspecified part of unspecified bronchus or lung: Secondary | ICD-10-CM | POA: Diagnosis not present

## 2023-03-05 NOTE — Progress Notes (Signed)
[facility] [phone]   Fax:([phone]  PROGRESS NOTE FOR TELEMEDICINE VISITS  [name], NP [address] [place] [address]  I connected withNAME@ on 03/05/23 at  2:00 PM EDT by telephone visit and verified that I am speaking with the correct person using two identifiers.   I discussed the limitations, risks, security and privacy concerns of performing an evaluation and management service by telemedicine and the availability of in-person appointments. I also discussed with the patient that there may be a patient responsible charge related to this service. The patient expressed understanding and agreed to proceed.  Other persons participating in the visit and their role in the encounter: Wife  Patient's location: Home Provider's location: [facility]  DIAGNOSIS: Metastatic non-small cell lung cancer initially diagnosed as stage IIIA (T3, N1, M0) non-small cell lung cancer, adenocarcinoma with no actionable mutations presented with multiple pulmonary nodules in the right upper lobe as well as metastatic disease in intraparenchymal lymphadenopathy.  This was diagnosed in February 2020.  The patient has disease recurrence in March 2022. PD-L1 expression 20%. He has no actionable mutations by foundation 1.   PRIOR THERAPY: 1) Status post right upper lobectomy with lymph node dissection. 2) Adjuvant systemic chemotherapy with cisplatin 75 mg/M2 and Alimta 500 mg/M2 every 3 weeks.  Status post 4 cycles. 3) palliative radiotherapy to the metastatic bone disease and the scapula as well as the left supraclavicular area under the care of Dr. Basilio Cairo. 3) Systemic chemotherapy with carboplatin for AUC of 5, Alimta 500 mg/M2 and Keytruda 200 mg IV every 3 weeks.  First dose November 06, 2020.  Status post 12 cycles.  Starting from cycle #5, the patient will be treated with maintenance Keytruda and Alimta IV every 3 weeks. Dose reduced to  Alimta 400 mg/m2 starting from cycle #11 because of worsening anemia.  Keytruda discontinued from cycle #11 due to possible checkpoint inhibitor toxicity of the stomach.   CURRENT THERAPY: Observation   INTERVAL HISTORY: Sean Rose 67 y.o. male has a telephone virtual visit with me today for evaluation and discussion of his discuss results.  The patient continues to complain of the baseline fatigue as well as pain in the right shoulder area.  He was seen by his primary care physician and was given pain medication with little improvement.  He is expected to see an orthopedic surgeon for evaluation of his condition.  He denied having any current chest pain, shortness of breath except with exertion with no cough or hemoptysis.  He has no nausea, vomiting, diarrhea or constipation.  He has no headache or visual changes.  He had repeat CT scan of the chest, abdomen and pelvis performed recently and we are having the visit for evaluation and discussion of the scan results.  MEDICAL HISTORY: Past Medical History:  Diagnosis Date   Allergy    Anemia    Anginal pain (HCC)    Blood transfusion without reported diagnosis    CAD (coronary artery disease)    BMS to RCA 2007. Low risk nuclear stress 2020.   CAP (community acquired pneumonia) 09/2016   COPD (chronic obstructive pulmonary disease) (HCC)    Dyspnea    occasionally   Emphysema of lung (HCC)    GERD (gastroesophageal reflux disease)    Heart murmur    "I was told I had one when I was a kid"   Hemorrhoids    History of anal fissures    "no  surgeries" (10/30/2016)   Hyperlipidemia    Hypertension    lung ca dx'd 08/2018   with mets to bones in arms   Myocardial infarction [facility])    Peripheral arterial disease (HCC)    status post right common iliac artery stenting back in 2007   Seasonal allergies    Tobacco abuse     ALLERGIES:  is allergic to compazine [prochlorperazine] and cyclobenzaprine.  MEDICATIONS:  Current Outpatient  Medications  Medication Sig Dispense Refill   acetaminophen (TYLENOL) 500 MG tablet Take 500 mg by mouth as needed for moderate pain or mild pain.     amLODipine (NORVASC) 5 MG tablet TAKE 1 TABLET BY MOUTH DAILY 180 tablet 0   aspirin EC 81 MG tablet Take 81 mg by mouth daily.     cetirizine (ZYRTEC) 10 MG tablet Take 10 mg by mouth at bedtime.     clopidogrel (PLAVIX) 75 MG tablet TAKE 1 TABLET BY MOUTH EVERY DAY 90 tablet 1   esomeprazole (NEXIUM) 20 MG capsule Take 40 mg by mouth daily.     Magnesium 500 MG TABS Take 1 tablet (500 mg total) by mouth in the morning and at bedtime.     metoprolol succinate (TOPROL-XL) 25 MG 24 hr tablet TAKE 1 TABLET BY MOUTH DAILY 90 tablet 1   mirtazapine (REMERON) 15 MG tablet TAKE 1 TABLET(15 MG) BY MOUTH AT BEDTIME 90 tablet 0   Multiple Vitamin (MULTIVITAMIN WITH MINERALS) TABS tablet Take 1 tablet by mouth daily.     ondansetron (ZOFRAN) 4 MG tablet Take 1 tablet (4 mg total) by mouth every 8 (eight) hours as needed for nausea or vomiting. 20 tablet 0   oxyCODONE (ROXICODONE) 5 MG immediate release tablet Take 2 tablets (10 mg total) by mouth every 4 (four) hours as needed for severe pain. 60 tablet 0   rosuvastatin (CRESTOR) 5 MG tablet TAKE 1 TABLET(5 MG) BY MOUTH EVERY OTHER DAY (Patient taking differently: Take 5 mg by mouth every other day.) 45 tablet 1   sodium chloride 1 g tablet Take 1 g by mouth daily.     TRELEGY ELLIPTA 100-62.5-25 MCG/ACT AEPB INHALE 1 PUFF INTO THE LUNGS DAILY 60 each 3   No current facility-administered medications for this visit.   Facility-Administered Medications Ordered in Other Visits  Medication Dose Route Frequency Provider Last Rate Last Admin   diphenhydrAMINE (BENADRYL) 25 mg capsule             SURGICAL HISTORY:  Past Surgical History:  Procedure Laterality Date   ANKLE SURGERY Left    "rebuilt it"   ANTERIOR CRUCIATE LIGAMENT REPAIR Right    CARDIAC CATHETERIZATION  09/23/2008   Continued medical  therapy - may need GI evaluation in addition.   CARDIAC CATHETERIZATION  10/28/2007   Medical therapy recommended.   CARDIAC CATHETERIZATION  11/18/2006   In-stent restenosis RCA  (50% distal edge, 80% segmental mid, and 50-60% segmental proximal). Successful cutting balloon atherectomy using a [id number] cutting balloon. 3 inflations with atherectomy performed on mid and proximal portions resulting in reduction of 80% mid in-stent restenosis to less than 20% residual and 50-60% segmental proximal to less than 20% residual without dissection.   CARDIAC CATHETERIZATION  02/26/2006   Severe stenosis in RCA. Stenting performed using IVUS. 3.5x20 Maverick balloon deployed at [employer]. Distal stent-a 4x28 Liberte stent-deployed 12atm 48sec, 12atm 31sec, 4atm 19sec. Mid stent-a 4x28 Liberte stent-deployed 14atm 45sec, 14atm 60sec, 14atm 44sec. Proximal stent-4x8 Liberte- 14atm 45sec,14atm 47sec, 16atm  43sec. Severely diseased segment then appeared TIMI-3 flow.   CARDIOVASCULAR STRESS TEST  11/17/2012   No significant ECG changes. Septal perfusion defect is new when complared to study from 2010. Abnormal myocardial perfusion imaging with a basal to mid perfusion suggestive of previous MI.   CAROTID DOPPLER  08/09/2011   Bilateral Bulb/Proximal ICA - demonstrated a mild amount of fibrous plaque without evidence of significant diameter reduction reduction or other vascular abnormality.   CHEST TUBE INSERTION Right 09/02/2018   Procedure: Chest Tube Insertion;  Surgeon: Josephine Igo, DO;  Location: [facility] OR;  Service: Thoracic;  Laterality: Right;   COLONOSCOPY     2003, 2014   CORONARY ANGIOPLASTY WITH STENT PLACEMENT     FEMORAL ARTERY STENT     INGUINAL HERNIA REPAIR Right    IR IMAGING GUIDED PORT INSERTION  11/13/2020   KNEE ARTHROSCOPY Right "multiple"   LAPAROSCOPIC APPENDECTOMY N/A 12/07/2018   Procedure: APPENDECTOMY LAPAROSCOPIC;  Surgeon: Abigail Miyamoto, MD;  Location: [facility] ORS;  Service: General;   Laterality: N/A;   LOWER EXTREMITY ARTERIAL DOPPLER  01/31/2011   Bilateral ABIs-normal values with no suggestion of arterial insuff to the lower extremities at rest. 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 Right common iliac artery with an 8.0x18 mm Herculink stent deployed at 12 atm. Stenosis was reduced from 80% to 0% with brisk flow. b. I-cast stenting to left common iliac artery   PERIPHERAL VASCULAR CATHETERIZATION N/A 04/13/2015   Procedure: Lower Extremity Angiography;  Surgeon: Runell Gess, MD; L-oCIA 75%, 40-50% L-EIA, R-CIA stent patent, s/p 8 mm x 38 mm ICast covered stent>>0% stenosis in L-oCIA      SHOULDER ARTHROSCOPY WITH ROTATOR CUFF REPAIR Right    TRANSTHORACIC ECHOCARDIOGRAM  11/26/2012   EF not noted. Aortic valve-sclerosis without stenosis, no regurgiation.    UPPER GASTROINTESTINAL ENDOSCOPY     US CAROTID DOPPLER BILATERAL ([facility] HX)  08/09/2011   Bilateral Bulb/Proximal ICAa demonstrated a mild amount of fibrous plaque without evidence of significant diameter reduction or any other vascular abnormality.   VIDEO ASSISTED THORACOSCOPY (VATS)/ LOBECTOMY Right 09/14/2018   Procedure: RIGHT VIDEO ASSISTED THORACOSCOPY (VATS)/ RIGHT UPPER LOBECTOMY;  Surgeon: Loreli Slot, MD;  Location: [facility] OR;  Service: Thoracic;  Laterality: Right;   VIDEO BRONCHOSCOPY WITH ENDOBRONCHIAL NAVIGATION N/A 09/02/2018   Procedure: VIDEO BRONCHOSCOPY WITH ENDOBRONCHIAL NAVIGATION;  Surgeon: Josephine Igo, DO;  Location: [facility] OR;  Service: Thoracic;  Laterality: N/A;   VIDEO BRONCHOSCOPY WITH INSERTION OF INTERBRONCHIAL VALVE (IBV) N/A 07/10/2022   Procedure: VIDEO BRONCHOSCOPY WITH INSERTION OF INTERBRONCHIAL VALVE (IBV);  Surgeon: Loreli Slot, MD;  Location: [facility] OR;  Service: Thoracic;  Laterality: N/A;   VIDEO BRONCHOSCOPY  WITH INSERTION OF INTERBRONCHIAL VALVE (IBV) N/A 09/06/2022   Procedure: VIDEO BRONCHOSCOPY WITH REMOVAL OF INTRABRONCHIAL VALVE (IBV);  Surgeon: Loreli Slot, MD;  Location: [facility] OR;  Service: Thoracic;  Laterality: N/A;    REVIEW OF SYSTEMS:  A comprehensive review of systems was negative except for: Constitutional: positive for fatigue Musculoskeletal: positive for arthralgias   LABORATORY DATA: Lab Results  Component Value Date   WBC 8.9 02/17/2023   HGB 10.4 (L) 02/17/2023   HCT 30.4 (L) 02/17/2023   MCV 94.1 02/17/2023   PLT 391 02/17/2023      Chemistry      Component Value  Date/Time   NA 126 (L) 02/17/2023 1409   NA 130 (L) 03/05/2017 1110   K 4.1 02/17/2023 1409   CL 92 (L) 02/17/2023 1409   CO2 29 02/17/2023 1409   BUN 25 (H) 02/17/2023 1409   BUN 4 (L) 03/05/2017 1110   CREATININE 1.24 02/17/2023 1409   CREATININE 0.76 04/28/2020 0826      Component Value Date/Time   CALCIUM 9.3 02/17/2023 1409   ALKPHOS 88 02/17/2023 1409   AST 18 02/17/2023 1409   ALT 10 02/17/2023 1409   BILITOT 0.3 02/17/2023 1409       RADIOGRAPHIC STUDIES: CT Chest Wo Contrast  Result Date: 02/24/2023 CLINICAL DATA:  Non-small cell lung cancer. Restaging. * Tracking Code: BO * EXAM: CT CHEST, ABDOMEN AND PELVIS WITHOUT CONTRAST TECHNIQUE: Multidetector CT imaging of the chest, abdomen and pelvis was performed following the standard protocol without IV contrast. RADIATION DOSE REDUCTION: This exam was performed according to the departmental dose-optimization program which includes automated exposure control, adjustment of the mA and/or kV according to patient size and/or use of iterative reconstruction technique. COMPARISON:  09/30/2022 CT chest, abdomen and pelvis. 12/03/2022 chest CT angiogram and CT abdomen/pelvis. 12/31/2022 CT abdomen/pelvis. FINDINGS: CT CHEST FINDINGS Cardiovascular: Normal heart size. No significant pericardial effusion/thickening. Left anterior descending and  right coronary atherosclerosis. Right internal jugular Port-A-Cath terminates at the cavoatrial junction. Atherosclerotic nonaneurysmal thoracic aorta. Normal caliber pulmonary arteries. Mediastinum/Nodes: No significant thyroid nodules. Chronic mildly patulous thoracic esophagus containing oral contrast. No pathologically enlarged axillary, mediastinal or hilar lymph nodes, noting limited sensitivity for the detection of hilar adenopathy on this noncontrast study. Lungs/Pleura: No pneumothorax. Status post right upper lobectomy. Chronic small loculated posterior bilateral pleural effusions with smooth pleural thickening on the right and pleural calcification on the left, not appreciably changed. Severe paraseptal and centrilobular emphysema. No acute consolidative airspace disease, lung masses or significant pulmonary nodules. Chronic plaque-like consolidation and volume loss in the posterior lower lobes bilaterally, unchanged and compatible with postinfectious scarring. Thick bandlike posterior left upper lobe consolidation with associated volume loss is unchanged and compatible with nonspecific scarring. Musculoskeletal: No aggressive appearing focal osseous lesions. Mild chronic T7 vertebral compression fracture, unchanged. Mild thoracic spondylosis. CT ABDOMEN PELVIS FINDINGS Hepatobiliary: Normal liver size. No liver mass. Cholelithiasis. No biliary ductal dilatation. Pancreas: Normal, with no mass or duct dilation. Spleen: Normal size. No mass. Adrenals/Urinary Tract: Normal adrenals. No renal stones. No hydronephrosis. Subcentimeter calcified posterior interpolar left renal cortical lesion is unchanged, for which no follow-up imaging is recommended. No additional contour deforming renal lesions. Normal bladder. Stomach/Bowel: Chronic proximal and mid body gastric wall thickening. Normal caliber small bowel with no small bowel wall thickening. Oral contrast transits to the rectum. Appendix not discretely  visualized. Moderate diffuse colonic stool. No large bowel wall thickening, colonic diverticulosis or acute pericolonic fat stranding. Vascular/Lymphatic: Atherosclerotic abdominal aorta with dilated 2.7 cm infrarenal abdominal aorta. No pathologically enlarged lymph nodes in the abdomen or pelvis. Reproductive: Normal size prostate. Other: No pneumoperitoneum, ascites or focal fluid collection. Musculoskeletal: No aggressive appearing focal osseous lesions. Mild superior L4 vertebral compression fracture, chronic. IMPRESSION: 1. No evidence of recurrent metastatic disease in the chest, abdomen or pelvis. 2. Chronic small loculated posterior bilateral pleural effusions with smooth pleural thickening on the right and pleural calcification on the left, not appreciably changed. 3. Chronic proximal and mid body gastric wall thickening, nonspecific. 4. Moderate diffuse colonic stool, suggesting constipation. 5. Infrarenal 2.7 cm dilated abdominal aorta. Recommend follow-up ultrasound every  5 years. 6. Two-vessel coronary atherosclerosis. 7. Cholelithiasis. 8. Chronic mild T7 and L4 vertebral compression fractures. 9. Aortic Atherosclerosis (ICD10-I70.0) and Emphysema (ICD10-J43.9). Electronically Signed   By: Delbert Phenix M.D.   On: 02/24/2023 16:59   CT Abdomen Pelvis Wo Contrast  Result Date: 02/24/2023 CLINICAL DATA:  Non-small cell lung cancer. Restaging. * Tracking Code: BO * EXAM: CT CHEST, ABDOMEN AND PELVIS WITHOUT CONTRAST TECHNIQUE: Multidetector CT imaging of the chest, abdomen and pelvis was performed following the standard protocol without IV contrast. RADIATION DOSE REDUCTION: This exam was performed according to the departmental dose-optimization program which includes automated exposure control, adjustment of the mA and/or kV according to patient size and/or use of iterative reconstruction technique. COMPARISON:  09/30/2022 CT chest, abdomen and pelvis. 12/03/2022 chest CT angiogram and CT  abdomen/pelvis. 12/31/2022 CT abdomen/pelvis. FINDINGS: CT CHEST FINDINGS Cardiovascular: Normal heart size. No significant pericardial effusion/thickening. Left anterior descending and right coronary atherosclerosis. Right internal jugular Port-A-Cath terminates at the cavoatrial junction. Atherosclerotic nonaneurysmal thoracic aorta. Normal caliber pulmonary arteries. Mediastinum/Nodes: No significant thyroid nodules. Chronic mildly patulous thoracic esophagus containing oral contrast. No pathologically enlarged axillary, mediastinal or hilar lymph nodes, noting limited sensitivity for the detection of hilar adenopathy on this noncontrast study. Lungs/Pleura: No pneumothorax. Status post right upper lobectomy. Chronic small loculated posterior bilateral pleural effusions with smooth pleural thickening on the right and pleural calcification on the left, not appreciably changed. Severe paraseptal and centrilobular emphysema. No acute consolidative airspace disease, lung masses or significant pulmonary nodules. Chronic plaque-like consolidation and volume loss in the posterior lower lobes bilaterally, unchanged and compatible with postinfectious scarring. Thick bandlike posterior left upper lobe consolidation with associated volume loss is unchanged and compatible with nonspecific scarring. Musculoskeletal: No aggressive appearing focal osseous lesions. Mild chronic T7 vertebral compression fracture, unchanged. Mild thoracic spondylosis. CT ABDOMEN PELVIS FINDINGS Hepatobiliary: Normal liver size. No liver mass. Cholelithiasis. No biliary ductal dilatation. Pancreas: Normal, with no mass or duct dilation. Spleen: Normal size. No mass. Adrenals/Urinary Tract: Normal adrenals. No renal stones. No hydronephrosis. Subcentimeter calcified posterior interpolar left renal cortical lesion is unchanged, for which no follow-up imaging is recommended. No additional contour deforming renal lesions. Normal bladder. Stomach/Bowel:  Chronic proximal and mid body gastric wall thickening. Normal caliber small bowel with no small bowel wall thickening. Oral contrast transits to the rectum. Appendix not discretely visualized. Moderate diffuse colonic stool. No large bowel wall thickening, colonic diverticulosis or acute pericolonic fat stranding. Vascular/Lymphatic: Atherosclerotic abdominal aorta with dilated 2.7 cm infrarenal abdominal aorta. No pathologically enlarged lymph nodes in the abdomen or pelvis. Reproductive: Normal size prostate. Other: No pneumoperitoneum, ascites or focal fluid collection. Musculoskeletal: No aggressive appearing focal osseous lesions. Mild superior L4 vertebral compression fracture, chronic. IMPRESSION: 1. No evidence of recurrent metastatic disease in the chest, abdomen or pelvis. 2. Chronic small loculated posterior bilateral pleural effusions with smooth pleural thickening on the right and pleural calcification on the left, not appreciably changed. 3. Chronic proximal and mid body gastric wall thickening, nonspecific. 4. Moderate diffuse colonic stool, suggesting constipation. 5. Infrarenal 2.7 cm dilated abdominal aorta. Recommend follow-up ultrasound every 5 years. 6. Two-vessel coronary atherosclerosis. 7. Cholelithiasis. 8. Chronic mild T7 and L4 vertebral compression fractures. 9. Aortic Atherosclerosis (ICD10-I70.0) and Emphysema (ICD10-J43.9). Electronically Signed   By: Delbert Phenix M.D.   On: 02/24/2023 16:59   DG Chest 2 View  Result Date: 02/07/2023 CLINICAL DATA:  Shortness of breath EXAM: CHEST - 2 VIEW COMPARISON:  12/06/2022, 12/03/2022, 09/17/2022 FINDINGS:  Right-sided central venous port tip at the cavoatrial region. Similar bilateral effusions and bibasilar airspace disease. Postsurgical changes in the right upper lung. Stable cardiomediastinal silhouette. No visible pneumothorax. Emphysematous disease IMPRESSION: Similar bilateral effusions and bibasilar airspace disease. Emphysema  Electronically Signed   By: Jasmine Pang M.D.   On: 02/07/2023 15:51    ASSESSMENT AND PLAN: This is a very pleasant 67 years old white male with a stage IIIB non-small cell lung cancer, adenocarcinoma with no actionable mutations status post right upper lobectomy with lymph node dissection under the care of Dr. Dorris Fetch. The patient completed a course of adjuvant treatment with cisplatin and Alimta status post 4 cycles.  He tolerated the previous 4 cycles of his treatment fairly well. The patient has been in observation but the recent imaging studies showed suspicious bone lesion in the left shoulder area. I ordered a PET scan which was performed on 09/25/2020 and it showed metastatic disease involving the left scapula and left supraclavicular lymph nodes. The patient had ultrasound-guided core biopsy of the left supraclavicular lymph node and it was positive for metastatic carcinoma. He underwent palliative radiotherapy to the metastatic bone lesion in the left scapula and he is feeling much better. PD-L1 expression 20%. He was tested in the past for molecular study by foundation 1 and it was reported to be negative for actionable mutations. The patient started systemic chemotherapy with carboplatin for AUC of 5, Alimta 500 Mg/M2 and Keytruda 200 Mg IV every 3 weeks status post 12 cycles. Starting from cycle #5 he is treated with maintenance Alimta and Keytruda every 3 weeks.  His dose of Alimta was reduced to 400 Mg/M2 starting from cycle #5 because of worsening anemia and Keytruda was also discontinued starting cycle #11 secondary to suspicious immunotherapy induced gastritis.  His treatment was discontinued after cycle #12 secondary to intolerance. The patient is currently on observation since December 2022.   The patient is doing fine today with no concerning complaints except for the fatigue and arthralgia in the right shoulder. He had repeat CT scan of the chest, abdomen pelvis performed  recently.  I personally and independently reviewed the scan and discussed the results with the patient today. His scan showed no concerning findings for disease recurrence or metastasis. I recommended for him to continue on observation with repeat CT scan of the chest, abdomen and pelvis in 6 months. For the right shoulder pain, he will reach out to an orthopedic surgery for evaluation. He was advised to call immediately if he has any other concerning symptoms in the interval. I discussed the assessment and treatment plan with the patient. The patient was provided an opportunity to ask questions and all were answered. The patient agreed with the plan and demonstrated an understanding of the instructions.   The patient was advised to call back or seek an in-person evaluation if the symptoms worsen or if the condition fails to improve as anticipated.  I provided 15 minutes of non face-to-face telephone visit time during this encounter, and > 50% was spent counseling as documented under my assessment & plan.  Lajuana Matte, MD 03/05/2023 1:58 PM  Disclaimer: This note was dictated with voice recognition software. Similar sounding words can inadvertently be transcribed and may not be corrected upon review.

## 2023-03-06 ENCOUNTER — Other Ambulatory Visit: Payer: Self-pay | Admitting: Cardiovascular Disease

## 2023-03-11 ENCOUNTER — Other Ambulatory Visit: Payer: Self-pay | Admitting: Adult Health

## 2023-03-11 DIAGNOSIS — G894 Chronic pain syndrome: Secondary | ICD-10-CM

## 2023-03-11 NOTE — Telephone Encounter (Signed)
Please advise pt requesting mre than 20 tab of Zofran.

## 2023-03-11 NOTE — Telephone Encounter (Signed)
Prescription Request  03/11/2023  LOV: 11/27/2022  What is the name of the medication or equipment? TRELEGY ELLIPTA 100-62.5-25 MCG/ACT AEPB  also pt would like more than #20  ondansetron (ZOFRAN) 4 MG tablet  Have you contacted your pharmacy to request a refill? No   Which pharmacy would you like this sent to?   [id number] [place], [place] - [address] AT [facility] OF [address] [facility] [address] & [facility] [address] [place] [address] Phone: [phone] Fax: [phone]     Patient notified that their request is being sent to the clinical staff for review and that they should receive a response within 2 business days.   Please advise at Mobile [phone] (mobile)

## 2023-03-12 ENCOUNTER — Other Ambulatory Visit: Payer: Self-pay | Admitting: Cardiovascular Disease

## 2023-03-12 MED ORDER — ONDANSETRON HCL 4 MG PO TABS: 4.0000 mg | ORAL_TABLET | Freq: Three times a day (TID) | ORAL | 0 refills | Status: DC | PRN

## 2023-03-12 MED ORDER — TRELEGY ELLIPTA 100-62.5-25 MCG/ACT IN AEPB: 1.0000 | INHALATION_SPRAY | Freq: Every day | RESPIRATORY_TRACT | 3 refills | Status: DC

## 2023-03-12 NOTE — Telephone Encounter (Signed)
Insurance only pays for 20 tab of Zofran. Tried to call pt or spouse to advise of update but no answer.

## 2023-03-17 ENCOUNTER — Telehealth: Payer: Self-pay | Admitting: Emergency Medicine

## 2023-03-17 MED ORDER — AMLODIPINE BESYLATE 5 MG PO TABS: 5.0000 mg | ORAL_TABLET | Freq: Every day | ORAL | 0 refills | Status: DC

## 2023-03-17 NOTE — Telephone Encounter (Signed)
Pt c/o medication issue:  1. Name of Medication: amLODipine (NORVASC) 5 MG tablet   2. How are you currently taking this medication (dosage and times per day)?   3. Are you having a reaction (difficulty breathing--STAT)?   4. What is your medication issue? Patient's wife is calling stating that patient is in need of this medication. She states that it is hard for patient to come in for visit, due to being terminal for lung issues. She also states that he isn't on hospice yet, but soon will be and would like a call back to see if there are other options for appts to get this medication called in. Please advise.

## 2023-03-17 NOTE — Telephone Encounter (Signed)
Spoke to patient wife ( per DPr) .  She is aware patient needs an appointment for  medication refilled.  She  states it is difficult for patient to come to an in office appointment. She was in agreement to do a virtual visit. RN Informed wife medication will be E-sent to pharmacy.   RN informed wife is the other option is patient 's primary can take over refills if patient has more current  appointments if primary is in agreement.  Wife verbalized understanding.  Appointment has been schedule with Thane Edu PA  on 04/08/23 ( virtual Visit)  Irene Limbo PA aware and authorized. RN gave instruction on how Virtual visit start on 04/08/23.   Wife states the Amlodipine is the only medication need for refill at present time

## 2023-03-19 ENCOUNTER — Other Ambulatory Visit: Payer: Self-pay | Admitting: Cardiovascular Disease

## 2023-03-19 ENCOUNTER — Other Ambulatory Visit: Payer: Self-pay | Admitting: Adult Health

## 2023-03-20 ENCOUNTER — Telehealth: Payer: Self-pay | Admitting: Internal Medicine

## 2023-03-21 ENCOUNTER — Telehealth: Payer: Self-pay | Admitting: Cardiovascular Disease

## 2023-03-21 ENCOUNTER — Telehealth: Payer: Self-pay | Admitting: Adult Health

## 2023-03-21 MED ORDER — OXYCODONE HCL 5 MG PO TABS: 10.0000 mg | ORAL_TABLET | ORAL | 0 refills | Status: DC | PRN

## 2023-03-21 NOTE — Telephone Encounter (Signed)
Refills sent to preferred pharmacy.  

## 2023-03-21 NOTE — Telephone Encounter (Signed)
Okay for refill?  

## 2023-03-21 NOTE — Telephone Encounter (Signed)
This was taking care of. °

## 2023-03-21 NOTE — Telephone Encounter (Signed)
Prescription Request  03/21/2023  LOV: 11/27/2022  What is the name of the medication or equipment? Oxycodone. Pt's wife states she sent a msg through MyChart on Wednesday requesting refill. Pt has 2 doses left.   Have you contacted your pharmacy to request a refill? Yes   Which pharmacy would you like this sent to? [facility] #[id number] [place], [place] - [address] AT [facility] OF [facility] [address] & [facility] [address] [place] [address] Phone: [phone] Fax: [phone]   Patient notified that their request is being sent to the clinical staff for review and that they should receive a response within 2 business days.   Please advise at Mobile [phone] (mobile)

## 2023-03-21 NOTE — Telephone Encounter (Signed)
error 

## 2023-03-21 NOTE — Telephone Encounter (Signed)
*  STAT* If patient is at the pharmacy, call can be transferred to refill team.   1. Which medications need to be refilled? (please list name of each medication and dose if known) amLODipine (NORVASC) 5 MG tablet   2. Which pharmacy/location (including street and city if local pharmacy) is medication to be sent to?  [facility] #[id number] - [place], [place] - [address] AT [facility] OF [address]    3. Do they need a 30 day or 90 day supply? 90  Pharmacy didn't realize the prescription. Please resend

## 2023-03-25 ENCOUNTER — Ambulatory Visit: Payer: Self-pay

## 2023-03-25 ENCOUNTER — Telehealth: Payer: Self-pay | Admitting: Adult Health

## 2023-03-25 NOTE — Telephone Encounter (Signed)
Noted  

## 2023-03-25 NOTE — Telephone Encounter (Signed)
Prescription Request  03/25/2023  LOV: 11/27/2022  What is the name of the medication or equipment? oxyCODONE (ROXICODONE) 5 MG immediate release tablet   Have you contacted your pharmacy to request a refill? No   Which pharmacy would you like this sent to?  [facility] #[id number] [place], [place] - [address] AT [facility] OF [address] & [facility] [address] Phone: [phone]  Fax: [phone]     Patient notified that their request is being sent to the clinical staff for review and that they should receive a response within 2 business days.   Please advise at Mobile [phone] (mobile)

## 2023-03-25 NOTE — Patient Outreach (Signed)
  Care Coordination   Follow Up Visit Note   03/25/2023 Name: ANQUAN GORELICK MRN: [id number] DOB: [birth year]  Yevonne Pax Calfee is a 67 y.o. year old male who sees Nafziger, Kandee Keen, NP for primary care. I  spoke with spouse [name] by phone today.  What matters to the patients health and wellness today?  Maintaining current status    Goals Addressed             This Visit's Progress    Lung cancer       Care Coordination Interventions: Evaluation of current treatment plan related to Lung cancer and patient's adherence to plan as established by provider Discussed plans with patient for ongoing care management follow up and provided patient with direct contact information for care management team Patient involved with palliative care now. Reviewed upcoming provider appointments and treatment appointments Assessed support system. Has consistent/reliable family or other support: Yes Patient has follow up with PCP today to discuss palliative/hospice Active listening / Reflection utilized  Emotional Support Provided  Spoke with spouse [name] she states patient about the same. She reports he has some issues with pain control of right shoulder not related to cancer.  PCP is writing prescriptions but they have to call often for refills.  Bowel regimen currently is working for patient while taking pain medications.  No concerns.        SDOH assessments and interventions completed:  Yes     Care Coordination Interventions:  Yes, provided   Follow up plan: Follow up call scheduled for September    Encounter Outcome:  Pt. Visit Completed   Bary Leriche, RN, MSN [facility]  [facility], [facility] Management Community Coordinator Direct Dial: [phone]  Fax: [phone] Website: [email or web address]

## 2023-03-25 NOTE — Telephone Encounter (Signed)
Please advise 

## 2023-03-25 NOTE — Patient Instructions (Signed)
Visit Information  Thank you for taking time to visit with me today. Please don't hesitate to contact me if I can be of assistance to you.   Following are the goals we discussed today:   Goals Addressed             This Visit's Progress    Lung cancer       Care Coordination Interventions: Evaluation of current treatment plan related to Lung cancer and patient's adherence to plan as established by provider Discussed plans with patient for ongoing care management follow up and provided patient with direct contact information for care management team Patient involved with palliative care now. Reviewed upcoming provider appointments and treatment appointments Assessed support system. Has consistent/reliable family or other support: Yes Patient has follow up with PCP today to discuss palliative/hospice Active listening / Reflection utilized  Emotional Support Provided  Spoke with spouse [name] she states patient about the same. She reports he has some issues with pain control of right shoulder not related to cancer.  PCP is writing prescriptions but they have to call often for refills.  Bowel regimen currently is working for patient while taking pain medications.  No concerns.        Our next appointment is by telephone on 04/28/23 at 1130 am  Please call the care guide team at [phone] if you need to cancel or reschedule your appointment.   If you are experiencing a Mental Health or Behavioral Health Crisis or need someone to talk to, please call the Suicide and Crisis Lifeline: 988   Patient verbalizes understanding of instructions and care plan provided today and agrees to view in MyChart. Active MyChart status and patient understanding of how to access instructions and care plan via MyChart confirmed with patient.     The patient has been provided with contact information for the care management team and has been advised to call with any health related questions or concerns.    Bary Leriche, RN, MSN [facility], [facility] Management Community Coordinator Direct Dial: [phone]  Fax: [phone] Website: [email or web address]

## 2023-03-27 ENCOUNTER — Other Ambulatory Visit: Payer: Self-pay | Admitting: Adult Health

## 2023-03-27 DIAGNOSIS — G894 Chronic pain syndrome: Secondary | ICD-10-CM

## 2023-03-27 NOTE — Telephone Encounter (Signed)
Pt spouse notified of update and verbalized understanding.  

## 2023-03-28 ENCOUNTER — Other Ambulatory Visit: Payer: Self-pay | Admitting: Cardiovascular Disease

## 2023-03-28 ENCOUNTER — Other Ambulatory Visit: Payer: Self-pay | Admitting: Adult Health

## 2023-03-28 MED ORDER — OXYCODONE HCL 5 MG PO TABS: 10.0000 mg | ORAL_TABLET | ORAL | 0 refills | Status: DC | PRN

## 2023-03-28 NOTE — Telephone Encounter (Signed)
Pt wife is calling and #60 only last about 7 days and pt will be out tomorrow and please send rx to  [facility] #[id number] [place], [place] - [address] AT [facility] OF [address] & [facility] [address] Phone: [phone]  Fax: [phone]

## 2023-03-28 NOTE — Telephone Encounter (Signed)
Patient spouse notified of update  and verbalized understanding. 

## 2023-03-28 NOTE — Telephone Encounter (Signed)
Please advise 

## 2023-04-02 ENCOUNTER — Encounter: Payer: Self-pay | Admitting: Adult Health

## 2023-04-02 ENCOUNTER — Telehealth (INDEPENDENT_AMBULATORY_CARE_PROVIDER_SITE_OTHER): Payer: BC Managed Care – PPO | Admitting: Adult Health

## 2023-04-02 DIAGNOSIS — C3491 Malignant neoplasm of unspecified part of right bronchus or lung: Secondary | ICD-10-CM | POA: Diagnosis not present

## 2023-04-02 DIAGNOSIS — G894 Chronic pain syndrome: Secondary | ICD-10-CM | POA: Diagnosis not present

## 2023-04-02 MED ORDER — OXYCODONE HCL 15 MG PO TABS
15.0000 mg | ORAL_TABLET | ORAL | 0 refills | Status: AC | PRN
Start: 2023-04-02 — End: 2023-05-02

## 2023-04-02 MED ORDER — GABAPENTIN 300 MG PO CAPS: 300.0000 mg | ORAL_CAPSULE | Freq: Two times a day (BID) | ORAL | 1 refills | Status: DC

## 2023-04-02 NOTE — Progress Notes (Signed)
Virtual Visit via Video Note  I connected with Sean Rose  on 04/02/23 at 11:45 AM EDT by a video enabled telemedicine application and verified that I am speaking with the correct person using two identifiers.  Location patient: home Location provider:work or home office Persons participating in the virtual visit: patient, provider, wife   I discussed the limitations of evaluation and management by telemedicine and the availability of in person appointments. The patient expressed understanding and agreed to proceed.   HPI: He has history of metastatic non-small cell lung cancer.  Current pain management includes oxycodone 10 mg 3-4 times a day and Tylenol 500 mg as needed.  Regimen allows the pain to be "tolerable" but never resolves the pain.  Pain is especially worse in his right shoulder and upper arm.  Pain is usually still 6-7 out of 10 when he takes his medication.  Medication may help for just a few hours.   ROS: See pertinent positives and negatives per HPI.  Past Medical History:  Diagnosis Date   Allergy    Anemia    Anginal pain (HCC)    Blood transfusion without reported diagnosis    CAD (coronary artery disease)    BMS to RCA 2007. Low risk nuclear stress 2020.   CAP (community acquired pneumonia) 09/2016   COPD (chronic obstructive pulmonary disease) (HCC)    Dyspnea    occasionally   Emphysema of lung (HCC)    GERD (gastroesophageal reflux disease)    Heart murmur    "I was told I had one when I was a kid"   Hemorrhoids    History of anal fissures    "no surgeries" (10/30/2016)   Hyperlipidemia    Hypertension    lung ca dx'd 08/2018   with mets to bones in arms   Myocardial infarction [facility])    Peripheral arterial disease (HCC)    status post right common iliac artery stenting back in 2007   Seasonal allergies    Tobacco abuse     Past Surgical History:  Procedure Laterality Date   ANKLE SURGERY Left    "rebuilt it"   ANTERIOR CRUCIATE LIGAMENT REPAIR  Right    CARDIAC CATHETERIZATION  09/23/2008   Continued medical therapy - may need GI evaluation in addition.   CARDIAC CATHETERIZATION  10/28/2007   Medical therapy recommended.   CARDIAC CATHETERIZATION  11/18/2006   In-stent restenosis RCA  (50% distal edge, 80% segmental mid, and 50-60% segmental proximal). Successful cutting balloon atherectomy using a 325X15 cutting balloon. 3 inflations with atherectomy performed on mid and proximal portions resulting in reduction of 80% mid in-stent restenosis to less than 20% residual and 50-60% segmental proximal to less than 20% residual without dissection.   CARDIAC CATHETERIZATION  02/26/2006   Severe stenosis in RCA. Stenting performed using IVUS. 3.5x20 Maverick balloon deployed at [employer]. Distal stent-a 4x28 Liberte stent-deployed 12atm 48sec, 12atm 31sec, 4atm 19sec. Mid stent-a 4x28 Liberte stent-deployed 14atm 45sec, 14atm 60sec, 14atm 44sec. Proximal stent-4x8 Liberte- 14atm 45sec,14atm 47sec, 16atm 43sec. Severely diseased segment then appeared TIMI-3 flow.   CARDIOVASCULAR STRESS TEST  11/17/2012   No significant ECG changes. Septal perfusion defect is new when complared to study from 2010. Abnormal myocardial perfusion imaging with a basal to mid perfusion suggestive of previous MI.   CAROTID DOPPLER  08/09/2011   Bilateral Bulb/Proximal ICA - demonstrated a mild amount of fibrous plaque without evidence of significant diameter reduction reduction or other vascular abnormality.   CHEST TUBE  INSERTION Right 09/02/2018   Procedure: Chest Tube Insertion;  Surgeon: Josephine Igo, DO;  Location: [facility] OR;  Service: Thoracic;  Laterality: Right;   COLONOSCOPY     2003, 2014   CORONARY ANGIOPLASTY WITH STENT PLACEMENT     FEMORAL ARTERY STENT     INGUINAL HERNIA REPAIR Right    IR IMAGING GUIDED PORT INSERTION  11/13/2020   KNEE ARTHROSCOPY Right "multiple"   LAPAROSCOPIC APPENDECTOMY N/A 12/07/2018   Procedure: APPENDECTOMY LAPAROSCOPIC;  Surgeon:  Abigail Miyamoto, MD;  Location: [facility] ORS;  Service: General;  Laterality: N/A;   LOWER EXTREMITY ARTERIAL DOPPLER  01/31/2011   Bilateral ABIs-normal values with no suggestion of arterial insuff to the lower extremities at rest. 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 Right common iliac artery with an 8.0x18 mm Herculink stent deployed at 12 atm. Stenosis was reduced from 80% to 0% with brisk flow. b. I-cast stenting to left common iliac artery   PERIPHERAL VASCULAR CATHETERIZATION N/A 04/13/2015   Procedure: Lower Extremity Angiography;  Surgeon: Runell Gess, MD; L-oCIA 75%, 40-50% L-EIA, R-CIA stent patent, s/p 8 mm x 38 mm ICast covered stent>>0% stenosis in L-oCIA      SHOULDER ARTHROSCOPY WITH ROTATOR CUFF REPAIR Right    TRANSTHORACIC ECHOCARDIOGRAM  11/26/2012   EF not noted. Aortic valve-sclerosis without stenosis, no regurgiation.    UPPER GASTROINTESTINAL ENDOSCOPY     US CAROTID DOPPLER BILATERAL ([facility] HX)  08/09/2011   Bilateral Bulb/Proximal ICAa demonstrated a mild amount of fibrous plaque without evidence of significant diameter reduction or any other vascular abnormality.   VIDEO ASSISTED THORACOSCOPY (VATS)/ LOBECTOMY Right 09/14/2018   Procedure: RIGHT VIDEO ASSISTED THORACOSCOPY (VATS)/ RIGHT UPPER LOBECTOMY;  Surgeon: Loreli Slot, MD;  Location: [facility] OR;  Service: Thoracic;  Laterality: Right;   VIDEO BRONCHOSCOPY WITH ENDOBRONCHIAL NAVIGATION N/A 09/02/2018   Procedure: VIDEO BRONCHOSCOPY WITH ENDOBRONCHIAL NAVIGATION;  Surgeon: Josephine Igo, DO;  Location: [facility] OR;  Service: Thoracic;  Laterality: N/A;   VIDEO BRONCHOSCOPY WITH INSERTION OF INTERBRONCHIAL VALVE (IBV) N/A 07/10/2022   Procedure: VIDEO BRONCHOSCOPY WITH INSERTION OF INTERBRONCHIAL VALVE (IBV);  Surgeon: Loreli Slot, MD;  Location: [facility] OR;   Service: Thoracic;  Laterality: N/A;   VIDEO BRONCHOSCOPY WITH INSERTION OF INTERBRONCHIAL VALVE (IBV) N/A 09/06/2022   Procedure: VIDEO BRONCHOSCOPY WITH REMOVAL OF INTRABRONCHIAL VALVE (IBV);  Surgeon: Loreli Slot, MD;  Location: [facility] OR;  Service: Thoracic;  Laterality: N/A;    Family History  Problem Relation Age of Onset   Colon cancer Mother    Heart disease Father    Heart disease Paternal Grandfather    Rectal cancer Other    Esophageal cancer Neg Hx    Stomach cancer Neg Hx        Current Outpatient Medications:    acetaminophen (TYLENOL) 500 MG tablet, Take 500 mg by mouth as needed for moderate pain or mild pain., Disp: , Rfl:    amLODipine (NORVASC) 5 MG tablet, Take 1 tablet (5 mg total) by mouth daily. Keep appt on 04/08/23, Disp: 30 tablet, Rfl: 0   aspirin EC 81 MG tablet, Take 81 mg by mouth daily., Disp: , Rfl:    cetirizine (ZYRTEC) 10 MG tablet, Take 10 mg by mouth at bedtime., Disp: , Rfl:    clopidogrel (PLAVIX) 75 MG tablet, TAKE 1 TABLET  BY MOUTH EVERY DAY, Disp: 90 tablet, Rfl: 1   esomeprazole (NEXIUM) 20 MG capsule, Take 40 mg by mouth daily., Disp: , Rfl:    Fluticasone-Umeclidin-Vilant (TRELEGY ELLIPTA) 100-62.5-25 MCG/ACT AEPB, Inhale 1 puff into the lungs daily., Disp: 60 each, Rfl: 3   Magnesium 500 MG TABS, Take 1 tablet (500 mg total) by mouth in the morning and at bedtime., Disp: , Rfl:    metoprolol succinate (TOPROL-XL) 25 MG 24 hr tablet, Take 1 tablet (25 mg total) by mouth daily. Please call office to schedule an appt for further refills. Thank you, Disp: 30 tablet, Rfl: 0   mirtazapine (REMERON) 15 MG tablet, TAKE 1 TABLET(15 MG) BY MOUTH AT BEDTIME, Disp: 90 tablet, Rfl: 0   Multiple Vitamin (MULTIVITAMIN WITH MINERALS) TABS tablet, Take 1 tablet by mouth daily., Disp: , Rfl:    ondansetron (ZOFRAN) 4 MG tablet, TAKE 1 TABLET(4 MG) BY MOUTH EVERY 8 HOURS AS NEEDED FOR NAUSEA OR VOMITING, Disp: 20 tablet, Rfl: 0   oxyCODONE (ROXICODONE) 5  MG immediate release tablet, Take 2 tablets (10 mg total) by mouth every 4 (four) hours as needed for severe pain., Disp: 60 tablet, Rfl: 0   rosuvastatin (CRESTOR) 5 MG tablet, TAKE 1 TABLET(5 MG) BY MOUTH EVERY OTHER DAY (Patient taking differently: Take 5 mg by mouth every other day.), Disp: 45 tablet, Rfl: 1   sodium chloride 1 g tablet, Take 1 g by mouth daily., Disp: , Rfl:  No current facility-administered medications for this visit.  Facility-Administered Medications Ordered in Other Visits:    diphenhydrAMINE (BENADRYL) 25 mg capsule, , , ,   EXAM:  VITALS per patient if applicable:  GENERAL: alert, oriented, appears  acutely ill and thin.   HEENT: atraumatic, conjunttiva clear, no obvious abnormalities on inspection of external nose and ears  NECK: normal movements of the head and neck  LUNGS: on inspection no signs of respiratory distress, breathing rate appears normal, no obvious gross SOB, gasping or wheezing  CV: no obvious cyanosis  MS: moves all visible extremities without noticeable abnormality  PSYCH/NEURO: pleasant and cooperative, no obvious depression or anxiety, speech and thought processing grossly intact  ASSESSMENT AND PLAN:  Discussed the following assessment and plan:  1. Chronic pain syndrome - Will increase Roxicodone to 15 mg Q4H PRN and add Gabpentin 300 mg BID to help with pain relief.  - oxyCODONE (ROXICODONE) 15 MG immediate release tablet; Take 1 tablet (15 mg total) by mouth every 4 (four) hours as needed for pain.  Dispense: 90 tablet; Refill: 0 - gabapentin (NEURONTIN) 300 MG capsule; Take 1 capsule (300 mg total) by mouth 2 (two) times daily.  Dispense: 60 capsule; Refill: 1  2. Adenocarcinoma of right lung, stage 3 (HCC)  - oxyCODONE (ROXICODONE) 15 MG immediate release tablet; Take 1 tablet (15 mg total) by mouth every 4 (four) hours as needed for pain.  Dispense: 90 tablet; Refill: 0 - gabapentin (NEURONTIN) 300 MG capsule; Take 1  capsule (300 mg total) by mouth 2 (two) times daily.  Dispense: 60 capsule; Refill: 1      I discussed the assessment and treatment plan with the patient. The patient was provided an opportunity to ask questions and all were answered. The patient agreed with the plan and demonstrated an understanding of the instructions.   The patient was advised to call back or seek an in-person evaluation if the symptoms worsen or if the condition fails to improve as anticipated.   [name],  NP

## 2023-04-02 NOTE — Progress Notes (Deleted)
Virtual Visit via Video Note   Because of Sean Rose's co-morbid illnesses, he is at least at moderate risk for complications without adequate follow up.  This format is felt to be most appropriate for this patient at this time.  All issues noted in this document were discussed and addressed.  A limited physical exam was performed with this format.  Please refer to the patient's chart for his consent to telehealth for [facility]  {Did you have to convert this VIDEO visit to AUDIO only?                     :[id number]} Virtual platform was offered given ongoing worsening Covid-19 pandemic.  Date:  04/02/2023   ID:  Sean Rose, DOB August 06, 1955, MRN [id number]  Patient Location: Home Provider Location: Office  PCP:  [name], NP  Cardiologist:  Nanetta Batty, MD  Electrophysiologist:  None   Evaluation Performed:  Follow-Up Visit  Chief Complaint:  follow-up of CAD and PAD  History of Present Illness:    Sean Rose is a 67 y.o. male with a history of CAD s/p overlapping BMS x2 to RCA in 02/2006 and atherectomy in 10/2006 for in-stent restenosis of RCA, PAD s/p stenting of right common iliac artery in 2007 and stenting of left common iliac artery in 2016, COPD with emphysema, hypertension, hyperlipidemia, GERD, metastatic lung cancer, and prior tobacco abuse who is followed by Dr. Allyson Sabal and presents today for routine follow-up.   Patient has a history of CAD and PAD with prior cardiac and peripheral interventions as described above. Last cardiac catheterization in 08/2008 showed unchanged anatomy from prior cath with mild in-stent restenosis (at most 30%) of RCA. Last ischemic evaluation was a Myoview in 04/2019 which was low risk and showed no evidence of ischemia. Last Echo in 04/2019 showed LVEF of 60-65% with mild LVH and grade 2 diastolic dysfunction as well as moderately elevated PASP. Last aorta/ IVC/ iliac ultrasounds in 07/2020 showed patent bilateral common  iliac artery stents. ABIs were normal bilaterally.   Patient was last seen by Juanda Crumble, PA-C, in 03/2021 at which time he was doing well from a cardiac standpoint. He reported some shortness of breath with associated chest tightness when laying down ever since having his lobectomy. He had stage IV lung cancer at that time and stated that the life expectancy at this stage was <5 years. He denied any chest pain or claudication at that visit.  He has had multiple ED visits/ hospitalizations since last visit for a variety of issues including a fall (10/2021), syncope that occurred after urinating and was felt to be secondary to heat exhaustion (02/2022), loculated pneumothorax ultimately requiring left thoracostomy tube (05/2022 and 06/2022), abdominal pain/ vomiting/ diarrhea (11/2022 and 12/2022). He was most recently seen in the ED in 01/2023 for evaluation of pleuritic right sided chest pain and shortness of breath. He was worried that he may have another spontaneous pneumothorax. High-sensitivity troponin was negative. Chest x-ray showed stable bilateral pleural effusion but no evidence of pneumothorax. Labs were stable. He was felt to be stable for discharge. Oncology performed chest and abdominal/ pelvic CTs later that month which showed chronic small loculated bilateral pleural effusion, chronic proximal and mid body gastric wall thickening, moderate diffuse colonic stool suggesting constipation, and infrarenal 2.7 dilated abdominal aorta but no evidence of recurrent metastatic disease.    Per most recent Oncology note, plan is for continued observation with repeat CT  scan in 6 months.   Patient presents today for follow-up. This is being done as a virtual visit due to ***  CAD S/p remote PCI with overlapping BMS x2 to RCA in 02/2006 and then atherectomy in 10/2006 for in-stent restenosis of RCA. Last cardiac catheterization in 08/2008 showed unchanged anatomy from prior cath with mild in-stent  restenosis (at most 30%) of RCA. Last ischemic evaluation was a Myoview in 04/2019 which was low risk and showed no evidence of ischemia.  - No chest pain. *** - Continue aspirin and statin.  PAD S/p stenting of right common iliac artery in 2007 and stenting of left common iliac artery in 2016. Last aorta/ IVC/ iliac ultrasounds in 07/2020 showed patent bilateral common iliac artery stents. ABIs were normal bilaterally. - No claudication. *** - Continue aspirin and statin. - Given poor prognosis due to metastatic lung cancer and the fact that he is asymptomatic, will hold off repeat imaging at this time.   Hypertension BP well controlled. *** - Continue Amlodipine 5mg  daily.  Hyperlipidemia Lipid panel in ***: - Continue Crestor 5mg  daily.   Prior CV studies:    The following studies were reviewed:  Echocardiogram 05/05/2019: Impressions: 1. Left ventricular ejection fraction, by visual estimation, is 60 to  65%. The left ventricle has normal function. There is mildly increased  left ventricular hypertrophy.   2. Left ventricular diastolic Doppler parameters are consistent with  pseudonormalization pattern of LV diastolic filling.   3. Global right ventricle has normal systolic function.The right  ventricular size is normal. No increase in right ventricular wall  thickness.   4. Left atrial size was normal.   5. Right atrial size was mildly dilated.   6. The mitral valve is normal in structure. No evidence of mitral valve  regurgitation. No evidence of mitral stenosis.   7. The tricuspid valve is grossly normal. Tricuspid valve regurgitation  is mild.   8. The aortic valve is normal in structure. Aortic valve regurgitation  was not visualized by color flow Doppler. Structurally normal aortic  valve, with no evidence of sclerosis or stenosis.   9. The pulmonic valve was grossly normal. Pulmonic valve regurgitation is  not visualized by color flow Doppler.  10. Moderately  elevated pulmonary artery systolic pressure.  11. The atrial septum is grossly normal.  _______________  Myoview 05/14/2019: There was no ST segment deviation noted during stress. The left ventricular ejection fraction is normal (55-65%). Nuclear stress EF: 58%. Defect 1: There is a medium fixed defect of mild severity present in the basal inferoseptal, mid inferoseptal, apical septal and apex location. Given normal wall motion in this region, this is consistent with artifact The study is normal. This is a low risk study. _______________  Aorta/ IVC/ Iliac Ultrasound/ ABIs 08/21/2020: Summary:  Abdominal Aorta: The largest aortic measurement is 2.6 cm. The distal  aorta is mildly ectatic.  Stenosis:  Aorta-iliac atherosclerosis, without evidence of significant stenosis.  Bilateral common iliac artery stents appear patent.   ABI Summary: Right: Resting right ankle-brachial index is within normal range. No  evidence of significant right lower extremity arterial disease. The right  toe-brachial index is abnormal.   Left: Resting left ankle-brachial index is within normal range. No  evidence of significant left lower extremity arterial disease. The left  toe-brachial index is abnormal.   Labs/Other Tests and Data Reviewed:    EKG:  Last EKG from 02/07/2023 personally reviewed and showed narrow complex regular rhythm, rate 116 bpm, but  there was a lot of underlying artifact so unable to see clear P waves or T wave changes. Prior EKG in 12/2022 showed normal sinus rhythm with PVC and no acute ischemic changes.  Recent Labs: 07/04/2022: B Natriuretic Peptide 68.2 12/31/2022: Magnesium 2.5 02/17/2023: ALT 10; BUN 25; Creatinine 1.24; Hemoglobin 10.4; Platelet Count 391; Potassium 4.1; Sodium 126   Recent Lipid Panel Lab Results  Component Value Date/Time   CHOL 193 08/25/2020 12:01 PM   TRIG 171 (H) 08/25/2020 12:01 PM   HDL 59 08/25/2020 12:01 PM   CHOLHDL 3.3 08/25/2020 12:01 PM    CHOLHDL 4.4 12/18/2012 08:14 AM   LDLCALC 105 (H) 08/25/2020 12:01 PM    Wt Readings from Last 3 Encounters:  02/07/23 90 lb (40.8 kg)  01/03/23 84 lb 9.6 oz (38.4 kg)  12/31/22 80 lb (36.3 kg)     Objective:    Vital Signs:  There were no vitals taken for this visit.   VS Reviewed. General: No acute distress. Pulm: No labored breathing. No coughing during visit. No audible wheezing. Speaking in full sentences. Neuro: Alert and oriented. No slurred speech. Answers questions appropriately. Psych: Pleasant affect.  ASSESSMENT & PLAN:    ***   Time:   Today, I have spent *** minutes with the patient with telehealth technology discussing the above problems.     Medication Adjustments/Labs and Tests Ordered: Current medicines are reviewed at length with the patient today.  Concerns regarding medicines are outlined above.   Follow Up:  {F/U Format:[phone]} {follow up:15908}  Signed, Corrin Parker, PA-C  04/02/2023 11:35 AM    [facility]

## 2023-04-03 ENCOUNTER — Telehealth: Payer: Self-pay | Admitting: Adult Health

## 2023-04-03 NOTE — Telephone Encounter (Signed)
Spoke with pt wife, is aware that PCP does not recommend stopping medication. Verbalized understanding.

## 2023-04-03 NOTE — Telephone Encounter (Signed)
Pt wife [name] is calling and pt would like to stop taking plavix for a week and take advil instead of take tylenol for his pain

## 2023-04-06 NOTE — Progress Notes (Deleted)
Cardiology Office Note:  .   Date:  04/06/2023  ID:  Sean Rose, DOB Feb 20, 1956, MRN [id number] PCP: [name], NP  [facility] Providers Cardiologist:  Nanetta Batty, MD { Click to update primary MD,subspecialty MD or APP then REFRESH:1}   History of Present Illness: .   Sean Rose is a 67 y.o. male with past medical history of   ROS: ***  Studies Reviewed: .        *** Risk Assessment/Calculations:   {Does this patient have ATRIAL FIBRILLATION?:[id number]} No BP recorded.  {Refresh Note OR Click here to enter BP  :1}***       Physical Exam:   VS:  There were no vitals taken for this visit.   Wt Readings from Last 3 Encounters:  02/07/23 90 lb (40.8 kg)  01/03/23 84 lb 9.6 oz (38.4 kg)  12/31/22 80 lb (36.3 kg)    GEN: Well nourished, well developed in no acute distress NECK: No JVD; No carotid bruits CARDIAC: ***RRR, no murmurs, rubs, gallops RESPIRATORY:  Clear to auscultation without rales, wheezing or rhonchi  ABDOMEN: Soft, non-tender, non-distended EXTREMITIES:  No edema; No deformity   ASSESSMENT AND PLAN: .   ***    {Are you ordering a CV Procedure (e.g. stress test, cath, DCCV, TEE, etc)?   Press F2        :[id number]}  Dispo: ***  Signed, Rip Harbour, NP

## 2023-04-07 ENCOUNTER — Emergency Department (HOSPITAL_COMMUNITY): Payer: BC Managed Care – PPO

## 2023-04-07 ENCOUNTER — Inpatient Hospital Stay (HOSPITAL_COMMUNITY)
Admission: EM | Admit: 2023-04-07 | Discharge: 2023-04-09 | DRG: 193 | Disposition: A | Discharge status: Home / Self Care | Payer: BC Managed Care – PPO | Attending: Internal Medicine | Admitting: Internal Medicine

## 2023-04-07 ENCOUNTER — Other Ambulatory Visit (HOSPITAL_COMMUNITY): Payer: BC Managed Care – PPO

## 2023-04-07 ENCOUNTER — Encounter: Payer: Self-pay | Admitting: Adult Health

## 2023-04-07 ENCOUNTER — Inpatient Hospital Stay (HOSPITAL_COMMUNITY): Payer: BC Managed Care – PPO

## 2023-04-07 DIAGNOSIS — R64 Cachexia: Secondary | ICD-10-CM | POA: Diagnosis present

## 2023-04-07 DIAGNOSIS — I700 Atherosclerosis of aorta: Secondary | ICD-10-CM | POA: Diagnosis present

## 2023-04-07 DIAGNOSIS — C7951 Secondary malignant neoplasm of bone: Secondary | ICD-10-CM | POA: Diagnosis not present

## 2023-04-07 DIAGNOSIS — Z8249 Family history of ischemic heart disease and other diseases of the circulatory system: Secondary | ICD-10-CM | POA: Diagnosis not present

## 2023-04-07 DIAGNOSIS — C3411 Malignant neoplasm of upper lobe, right bronchus or lung: Secondary | ICD-10-CM | POA: Diagnosis not present

## 2023-04-07 DIAGNOSIS — Z9221 Personal history of antineoplastic chemotherapy: Secondary | ICD-10-CM

## 2023-04-07 DIAGNOSIS — R918 Other nonspecific abnormal finding of lung field: Secondary | ICD-10-CM | POA: Diagnosis not present

## 2023-04-07 DIAGNOSIS — J439 Emphysema, unspecified: Secondary | ICD-10-CM | POA: Diagnosis present

## 2023-04-07 DIAGNOSIS — E785 Hyperlipidemia, unspecified: Secondary | ICD-10-CM | POA: Diagnosis not present

## 2023-04-07 DIAGNOSIS — C3490 Malignant neoplasm of unspecified part of unspecified bronchus or lung: Secondary | ICD-10-CM | POA: Diagnosis not present

## 2023-04-07 DIAGNOSIS — J189 Pneumonia, unspecified organism: Secondary | ICD-10-CM | POA: Diagnosis not present

## 2023-04-07 DIAGNOSIS — R54 Age-related physical debility: Secondary | ICD-10-CM | POA: Diagnosis present

## 2023-04-07 DIAGNOSIS — J168 Pneumonia due to other specified infectious organisms: Secondary | ICD-10-CM | POA: Diagnosis not present

## 2023-04-07 DIAGNOSIS — R0781 Pleurodynia: Secondary | ICD-10-CM | POA: Diagnosis not present

## 2023-04-07 DIAGNOSIS — Z87891 Personal history of nicotine dependence: Secondary | ICD-10-CM | POA: Diagnosis not present

## 2023-04-07 DIAGNOSIS — E871 Hypo-osmolality and hyponatremia: Secondary | ICD-10-CM | POA: Diagnosis not present

## 2023-04-07 DIAGNOSIS — K219 Gastro-esophageal reflux disease without esophagitis: Secondary | ICD-10-CM | POA: Diagnosis present

## 2023-04-07 DIAGNOSIS — D509 Iron deficiency anemia, unspecified: Secondary | ICD-10-CM | POA: Diagnosis not present

## 2023-04-07 DIAGNOSIS — I252 Old myocardial infarction: Secondary | ICD-10-CM

## 2023-04-07 DIAGNOSIS — I10 Essential (primary) hypertension: Secondary | ICD-10-CM | POA: Diagnosis present

## 2023-04-07 DIAGNOSIS — I739 Peripheral vascular disease, unspecified: Secondary | ICD-10-CM | POA: Diagnosis present

## 2023-04-07 DIAGNOSIS — Z7982 Long term (current) use of aspirin: Secondary | ICD-10-CM

## 2023-04-07 DIAGNOSIS — Z7902 Long term (current) use of antithrombotics/antiplatelets: Secondary | ICD-10-CM

## 2023-04-07 DIAGNOSIS — I2510 Atherosclerotic heart disease of native coronary artery without angina pectoris: Secondary | ICD-10-CM | POA: Diagnosis not present

## 2023-04-07 DIAGNOSIS — R011 Cardiac murmur, unspecified: Secondary | ICD-10-CM | POA: Diagnosis present

## 2023-04-07 DIAGNOSIS — Z79899 Other long term (current) drug therapy: Secondary | ICD-10-CM | POA: Diagnosis not present

## 2023-04-07 DIAGNOSIS — J449 Chronic obstructive pulmonary disease, unspecified: Secondary | ICD-10-CM | POA: Diagnosis not present

## 2023-04-07 DIAGNOSIS — Z1152 Encounter for screening for COVID-19: Secondary | ICD-10-CM | POA: Diagnosis not present

## 2023-04-07 DIAGNOSIS — Z66 Do not resuscitate: Secondary | ICD-10-CM | POA: Diagnosis present

## 2023-04-07 DIAGNOSIS — Z955 Presence of coronary angioplasty implant and graft: Secondary | ICD-10-CM | POA: Diagnosis not present

## 2023-04-07 DIAGNOSIS — C3491 Malignant neoplasm of unspecified part of right bronchus or lung: Secondary | ICD-10-CM | POA: Diagnosis present

## 2023-04-07 DIAGNOSIS — Z7401 Bed confinement status: Secondary | ICD-10-CM | POA: Diagnosis not present

## 2023-04-07 DIAGNOSIS — E43 Unspecified severe protein-calorie malnutrition: Secondary | ICD-10-CM | POA: Diagnosis present

## 2023-04-07 DIAGNOSIS — F419 Anxiety disorder, unspecified: Secondary | ICD-10-CM | POA: Diagnosis not present

## 2023-04-07 DIAGNOSIS — R0902 Hypoxemia: Secondary | ICD-10-CM | POA: Diagnosis not present

## 2023-04-07 DIAGNOSIS — R0602 Shortness of breath: Secondary | ICD-10-CM | POA: Diagnosis not present

## 2023-04-07 DIAGNOSIS — R531 Weakness: Secondary | ICD-10-CM | POA: Diagnosis not present

## 2023-04-07 DIAGNOSIS — Z923 Personal history of irradiation: Secondary | ICD-10-CM

## 2023-04-07 DIAGNOSIS — Z7951 Long term (current) use of inhaled steroids: Secondary | ICD-10-CM

## 2023-04-07 DIAGNOSIS — Z888 Allergy status to other drugs, medicaments and biological substances status: Secondary | ICD-10-CM

## 2023-04-07 LAB — CBC WITH DIFFERENTIAL/PLATELET
Abs Immature Granulocytes: 0.06 10*3/uL (ref 0.00–0.07)
Basophils Absolute: 0 10*3/uL (ref 0.0–0.1)
Basophils Relative: 0 %
Eosinophils Absolute: 0.1 10*3/uL (ref 0.0–0.5)
Eosinophils Relative: 1 %
HCT: 31.6 % — ABNORMAL LOW (ref 39.0–52.0)
Hemoglobin: 10.4 g/dL — ABNORMAL LOW (ref 13.0–17.0)
Immature Granulocytes: 1 %
Lymphocytes Relative: 10 %
Lymphs Abs: 1.3 10*3/uL (ref 0.7–4.0)
MCH: 31.5 pg (ref 26.0–34.0)
MCHC: 32.9 g/dL (ref 30.0–36.0)
MCV: 95.8 fL (ref 80.0–100.0)
Monocytes Absolute: 0.9 10*3/uL (ref 0.1–1.0)
Monocytes Relative: 7 %
Neutro Abs: 10.1 10*3/uL — ABNORMAL HIGH (ref 1.7–7.7)
Neutrophils Relative %: 81 %
Platelets: 524 10*3/uL — ABNORMAL HIGH (ref 150–400)
RBC: 3.3 MIL/uL — ABNORMAL LOW (ref 4.22–5.81)
RDW: 14.4 % (ref 11.5–15.5)
WBC: 12.6 10*3/uL — ABNORMAL HIGH (ref 4.0–10.5)
nRBC: 0 % (ref 0.0–0.2)

## 2023-04-07 LAB — BASIC METABOLIC PANEL
Anion gap: 11 (ref 5–15)
BUN: 20 mg/dL (ref 8–23)
CO2: 24 mmol/L (ref 22–32)
Calcium: 8.6 mg/dL — ABNORMAL LOW (ref 8.9–10.3)
Chloride: 91 mmol/L — ABNORMAL LOW (ref 98–111)
Creatinine, Ser: 1.1 mg/dL (ref 0.61–1.24)
GFR, Estimated: >60 mL/min (ref >60)
Glucose, Bld: 112 mg/dL — ABNORMAL HIGH (ref 70–99)
Potassium: 3.6 mmol/L (ref 3.5–5.1)
Sodium: 126 mmol/L — ABNORMAL LOW (ref 135–145)

## 2023-04-07 LAB — I-STAT CHEM 8, ED
BUN: 19 mg/dL (ref 8–23)
Calcium, Ion: 1.23 mmol/L (ref 1.15–1.40)
Chloride: 90 mmol/L — ABNORMAL LOW (ref 98–111)
Creatinine, Ser: 1.2 mg/dL (ref 0.61–1.24)
Glucose, Bld: 108 mg/dL — ABNORMAL HIGH (ref 70–99)
HCT: 31 % — ABNORMAL LOW (ref 39.0–52.0)
Hemoglobin: 10.5 g/dL — ABNORMAL LOW (ref 13.0–17.0)
Potassium: 3.9 mmol/L (ref 3.5–5.1)
Sodium: 128 mmol/L — ABNORMAL LOW (ref 135–145)
TCO2: 27 mmol/L (ref 22–32)

## 2023-04-07 LAB — STREP PNEUMONIAE URINARY ANTIGEN: Strep Pneumo Urinary Antigen: NEGATIVE

## 2023-04-07 LAB — BRAIN NATRIURETIC PEPTIDE: B Natriuretic Peptide: 218.9 pg/mL — ABNORMAL HIGH (ref 0.0–100.0)

## 2023-04-07 LAB — TROPONIN I (HIGH SENSITIVITY)
Troponin I (High Sensitivity): 6 ng/L (ref <18)
Troponin I (High Sensitivity): 6 ng/L (ref <18)

## 2023-04-07 LAB — RESP PANEL BY RT-PCR (RSV, FLU A&B, COVID)  RVPGX2
Influenza A by PCR: NEGATIVE
Influenza B by PCR: NEGATIVE
Resp Syncytial Virus by PCR: NEGATIVE
SARS Coronavirus 2 by RT PCR: NEGATIVE

## 2023-04-07 MED ORDER — OXYCODONE HCL 5 MG PO TABS
15.0000 mg | ORAL_TABLET | ORAL | Status: DC | PRN
  Administered 2023-04-07 – 2023-04-09 (×11): 15 mg via ORAL
  Filled 2023-04-07 (×11): qty 3

## 2023-04-07 MED ORDER — AMLODIPINE BESYLATE 5 MG PO TABS
5.0000 mg | ORAL_TABLET | Freq: Every day | ORAL | Status: DC
  Administered 2023-04-07 – 2023-04-09 (×3): 5 mg via ORAL
  Filled 2023-04-07 (×3): qty 1

## 2023-04-07 MED ORDER — ACETAMINOPHEN 650 MG RE SUPP: 650.0000 mg | Freq: Four times a day (QID) | RECTAL | Status: DC | PRN

## 2023-04-07 MED ORDER — IPRATROPIUM-ALBUTEROL 0.5-2.5 (3) MG/3ML IN SOLN: 1.5000 mL | RESPIRATORY_TRACT | Status: DC | PRN

## 2023-04-07 MED ORDER — PANTOPRAZOLE SODIUM 40 MG PO TBEC
40.0000 mg | DELAYED_RELEASE_TABLET | Freq: Every day | ORAL | Status: DC
  Administered 2023-04-07 – 2023-04-09 (×3): 40 mg via ORAL
  Filled 2023-04-07 (×3): qty 1

## 2023-04-07 MED ORDER — ACETAMINOPHEN 325 MG PO TABS
650.0000 mg | ORAL_TABLET | Freq: Four times a day (QID) | ORAL | Status: DC | PRN
  Filled 2023-04-07: qty 2

## 2023-04-07 MED ORDER — MAGNESIUM OXIDE -MG SUPPLEMENT 400 (240 MG) MG PO TABS
400.0000 mg | ORAL_TABLET | Freq: Every day | ORAL | Status: DC
  Administered 2023-04-07 – 2023-04-08 (×2): 400 mg via ORAL
  Filled 2023-04-07 (×2): qty 1

## 2023-04-07 MED ORDER — SODIUM CHLORIDE 0.9 % IV SOLN
1.0000 g | INTRAVENOUS | Status: DC
  Administered 2023-04-08 – 2023-04-09 (×2): 1 g via INTRAVENOUS
  Filled 2023-04-07 (×3): qty 10

## 2023-04-07 MED ORDER — FLUTICASONE FUROATE-VILANTEROL 100-25 MCG/ACT IN AEPB
1.0000 | INHALATION_SPRAY | Freq: Every day | RESPIRATORY_TRACT | Status: DC
  Administered 2023-04-07 – 2023-04-09 (×3): 1 via RESPIRATORY_TRACT
  Filled 2023-04-07: qty 28

## 2023-04-07 MED ORDER — HYDROMORPHONE HCL 1 MG/ML IJ SOLN
1.0000 mg | Freq: Once | INTRAMUSCULAR | Status: AC | PRN
  Administered 2023-04-07: 1 mg via INTRAVENOUS
  Filled 2023-04-07: qty 1

## 2023-04-07 MED ORDER — METOPROLOL SUCCINATE ER 25 MG PO TB24
25.0000 mg | ORAL_TABLET | Freq: Every day | ORAL | Status: DC
  Administered 2023-04-07 – 2023-04-09 (×3): 25 mg via ORAL
  Filled 2023-04-07 (×3): qty 1

## 2023-04-07 MED ORDER — IOHEXOL 350 MG/ML SOLN
75.0000 mL | Freq: Once | INTRAVENOUS | Status: AC | PRN
  Administered 2023-04-07: 75 mL via INTRAVENOUS

## 2023-04-07 MED ORDER — CLOPIDOGREL BISULFATE 75 MG PO TABS
75.0000 mg | ORAL_TABLET | Freq: Every day | ORAL | Status: DC
  Administered 2023-04-07 – 2023-04-09 (×3): 75 mg via ORAL
  Filled 2023-04-07 (×3): qty 1

## 2023-04-07 MED ORDER — SODIUM CHLORIDE 0.9 % IV SOLN
500.0000 mg | Freq: Once | INTRAVENOUS | Status: AC
  Administered 2023-04-07: 500 mg via INTRAVENOUS
  Filled 2023-04-07: qty 5

## 2023-04-07 MED ORDER — FENTANYL CITRATE PF 50 MCG/ML IJ SOSY
25.0000 ug | PREFILLED_SYRINGE | Freq: Once | INTRAMUSCULAR | Status: AC
  Administered 2023-04-07: 25 ug via INTRAVENOUS
  Filled 2023-04-07: qty 1

## 2023-04-07 MED ORDER — SODIUM CHLORIDE 0.9 % IV SOLN: INTRAVENOUS | Status: DC

## 2023-04-07 MED ORDER — HYDROMORPHONE HCL 1 MG/ML IJ SOLN
1.0000 mg | INTRAMUSCULAR | Status: AC | PRN
  Administered 2023-04-07 – 2023-04-08 (×2): 1 mg via INTRAVENOUS
  Filled 2023-04-07 (×2): qty 1

## 2023-04-07 MED ORDER — ASPIRIN 81 MG PO TBEC
81.0000 mg | DELAYED_RELEASE_TABLET | Freq: Every day | ORAL | Status: DC
  Administered 2023-04-07 – 2023-04-09 (×3): 81 mg via ORAL
  Filled 2023-04-07 (×3): qty 1

## 2023-04-07 MED ORDER — UMECLIDINIUM BROMIDE 62.5 MCG/ACT IN AEPB
1.0000 | INHALATION_SPRAY | Freq: Every day | RESPIRATORY_TRACT | Status: DC
  Administered 2023-04-07 – 2023-04-09 (×3): 1 via RESPIRATORY_TRACT
  Filled 2023-04-07: qty 7

## 2023-04-07 MED ORDER — SODIUM CHLORIDE 0.9 % IV SOLN
1.0000 g | Freq: Once | INTRAVENOUS | Status: AC
  Administered 2023-04-07: 1 g via INTRAVENOUS
  Filled 2023-04-07: qty 10

## 2023-04-07 MED ORDER — SODIUM CHLORIDE 0.9 % IV SOLN
500.0000 mg | INTRAVENOUS | Status: DC
  Administered 2023-04-08 – 2023-04-09 (×2): 500 mg via INTRAVENOUS
  Filled 2023-04-07 (×2): qty 5

## 2023-04-07 MED ORDER — ONDANSETRON HCL 4 MG/2ML IJ SOLN: 4.0000 mg | Freq: Four times a day (QID) | INTRAMUSCULAR | Status: DC | PRN

## 2023-04-07 MED ORDER — ONDANSETRON HCL 4 MG PO TABS
4.0000 mg | ORAL_TABLET | Freq: Four times a day (QID) | ORAL | Status: DC | PRN
  Administered 2023-04-08: 4 mg via ORAL
  Filled 2023-04-07: qty 1

## 2023-04-07 NOTE — ED Triage Notes (Signed)
Pt from home, lung ca pt , reports SOB x 10hr , pt c/o pain under left chest/ribcage. Pt had treatment in EMS truck , sating 100 per EMS. Pt clear on ausculation per EMS.

## 2023-04-07 NOTE — Plan of Care (Signed)

## 2023-04-07 NOTE — H&P (Signed)
History and Physical    Patient: Sean Rose:[id number] DOB: 1955-10-02 DOA: 04/07/2023 DOS: the patient was seen and examined on 04/07/2023 PCP: [name], NP  Patient coming from: Home  Chief Complaint:  Chief Complaint  Patient presents with   Shortness of Breath   HPI: Sean Rose is a 67 y.o. male with medical history significant of seasonal allergies, unspecified anemia, CAD, angina, history of MI, heart murmur, hypertension, peripheral arterial disease, tobacco abuse, emphysema, GERD, history of community-acquired pneumonia who presented to the emergency department complaints of pleuritic chest pain and mild dyspnea since yesterday.  His wife helped him with his story as he is sometimes confused with some of the details. He denied fever, chills, rhinorrhea, sore throat, wheezing but has had productive cough of greenish and brown sputum.  There have been some bloody streaks within the expectorated phlegm.  No chest pain, palpitations, diaphoresis, PND, orthopnea or pitting edema of the lower extremities.  No nausea, emesis, diarrhea, constipation, melena or hematochezia.  No flank pain, dysuria, frequency or hematuria.  No polyuria, polydipsia, polyphagia or blurred vision.   Lab work: CBC showed a white count of 12.6 with 81% neutrophils, hemoglobin 10.4 g/dL platelets 244.  Coronavirus, influenza and RSV PCR was negative.  Troponin x 2 normal.  BNP 218.9 pg/mL.  BMP showed a sodium of 126 and chloride 91 mmol/L, glucose of 112 and calcium of 8.6 mg/dL.  Ionized calcium is normal on follow-up i-STAT Chem-8.  Imaging: CTA chest with no CT evidence of acute pulmonary embolus.  There is new groundglass opacity in the anterior left base suspicious for developing infection.  Scattered areas of chronic parenchymal disease most suggestive of scaring.  Relatively diffuse wall thickening in the stomach similar to prior.  Correlation to gastritis symptoms recommended.  Aortic  atherosclerosis.  Emphysema.  ED course: Initial vital signs: Temperature 98 F, pulse 73, respiration 19, BP 120/82 mmHg O2 sat 95%.  The patient received fentanyl 25 mcg IVP, ceftriaxone 1 g and azithromycin 500 mg IVPB.   Review of Systems: As mentioned in the history of present illness. All other systems reviewed and are negative. Past Medical History:  Diagnosis Date   Allergy    Anemia    Anginal pain (HCC)    Blood transfusion without reported diagnosis    CAD (coronary artery disease)    BMS to RCA 2007. Low risk nuclear stress 2020.   CAP (community acquired pneumonia) 09/2016   COPD (chronic obstructive pulmonary disease) (HCC)    Dyspnea    occasionally   Emphysema of lung (HCC)    GERD (gastroesophageal reflux disease)    Heart murmur    "I was told I had one when I was a kid"   Hemorrhoids    History of anal fissures    "no surgeries" (10/30/2016)   Hyperlipidemia    Hypertension    lung ca dx'd 08/2018   with mets to bones in arms   Myocardial infarction [facility])    Peripheral arterial disease (HCC)    status post right common iliac artery stenting back in 2007   Seasonal allergies    Tobacco abuse    Past Surgical History:  Procedure Laterality Date   ANKLE SURGERY Left    "rebuilt it"   ANTERIOR CRUCIATE LIGAMENT REPAIR Right    CARDIAC CATHETERIZATION  09/23/2008   Continued medical therapy - may need GI evaluation in addition.   CARDIAC CATHETERIZATION  10/28/2007   Medical therapy recommended.  CARDIAC CATHETERIZATION  11/18/2006   In-stent restenosis RCA  (50% distal edge, 80% segmental mid, and 50-60% segmental proximal). Successful cutting balloon atherectomy using a [id number] cutting balloon. 3 inflations with atherectomy performed on mid and proximal portions resulting in reduction of 80% mid in-stent restenosis to less than 20% residual and 50-60% segmental proximal to less than 20% residual without dissection.   CARDIAC CATHETERIZATION  02/26/2006   Severe  stenosis in RCA. Stenting performed using IVUS. 3.5x20 Maverick balloon deployed at [employer]. Distal stent-a 4x28 Liberte stent-deployed 12atm 48sec, 12atm 31sec, 4atm 19sec. Mid stent-a 4x28 Liberte stent-deployed 14atm 45sec, 14atm 60sec, 14atm 44sec. Proximal stent-4x8 Liberte- 14atm 45sec,14atm 47sec, 16atm 43sec. Severely diseased segment then appeared TIMI-3 flow.   CARDIOVASCULAR STRESS TEST  11/17/2012   No significant ECG changes. Septal perfusion defect is new when complared to study from 2010. Abnormal myocardial perfusion imaging with a basal to mid perfusion suggestive of previous MI.   CAROTID DOPPLER  08/09/2011   Bilateral Bulb/Proximal ICA - demonstrated a mild amount of fibrous plaque without evidence of significant diameter reduction reduction or other vascular abnormality.   CHEST TUBE INSERTION Right 09/02/2018   Procedure: Chest Tube Insertion;  Surgeon: Josephine Igo, DO;  Location: [facility] OR;  Service: Thoracic;  Laterality: Right;   COLONOSCOPY     2003, 2014   CORONARY ANGIOPLASTY WITH STENT PLACEMENT     FEMORAL ARTERY STENT     INGUINAL HERNIA REPAIR Right    IR IMAGING GUIDED PORT INSERTION  11/13/2020   KNEE ARTHROSCOPY Right "multiple"   LAPAROSCOPIC APPENDECTOMY N/A 12/07/2018   Procedure: APPENDECTOMY LAPAROSCOPIC;  Surgeon: Abigail Miyamoto, MD;  Location: [facility] ORS;  Service: General;  Laterality: N/A;   LOWER EXTREMITY ARTERIAL DOPPLER  01/31/2011   Bilateral ABIs-normal values with no suggestion of arterial insuff to the lower extremities at rest. 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 Right common iliac artery with an 8.0x18 mm Herculink stent deployed at 12 atm. Stenosis was reduced from 80% to 0% with brisk flow. b. I-cast stenting to left common iliac artery   PERIPHERAL VASCULAR CATHETERIZATION N/A  04/13/2015   Procedure: Lower Extremity Angiography;  Surgeon: Runell Gess, MD; L-oCIA 75%, 40-50% L-EIA, R-CIA stent patent, s/p 8 mm x 38 mm ICast covered stent>>0% stenosis in L-oCIA      SHOULDER ARTHROSCOPY WITH ROTATOR CUFF REPAIR Right    TRANSTHORACIC ECHOCARDIOGRAM  11/26/2012   EF not noted. Aortic valve-sclerosis without stenosis, no regurgiation.    UPPER GASTROINTESTINAL ENDOSCOPY     US CAROTID DOPPLER BILATERAL ([facility] HX)  08/09/2011   Bilateral Bulb/Proximal ICAa demonstrated a mild amount of fibrous plaque without evidence of significant diameter reduction or any other vascular abnormality.   VIDEO ASSISTED THORACOSCOPY (VATS)/ LOBECTOMY Right 09/14/2018   Procedure: RIGHT VIDEO ASSISTED THORACOSCOPY (VATS)/ RIGHT UPPER LOBECTOMY;  Surgeon: Loreli Slot, MD;  Location: [facility] OR;  Service: Thoracic;  Laterality: Right;   VIDEO BRONCHOSCOPY WITH ENDOBRONCHIAL NAVIGATION N/A 09/02/2018   Procedure: VIDEO BRONCHOSCOPY WITH ENDOBRONCHIAL NAVIGATION;  Surgeon: Josephine Igo, DO;  Location: [facility] OR;  Service: Thoracic;  Laterality: N/A;   VIDEO BRONCHOSCOPY WITH INSERTION OF INTERBRONCHIAL VALVE (IBV) N/A 07/10/2022   Procedure: VIDEO BRONCHOSCOPY WITH INSERTION OF INTERBRONCHIAL VALVE (IBV);  Surgeon: Loreli Slot, MD;  Location: [facility] OR;  Service: Thoracic;  Laterality: N/A;   VIDEO BRONCHOSCOPY WITH INSERTION OF INTERBRONCHIAL VALVE (IBV) N/A 09/06/2022   Procedure: VIDEO BRONCHOSCOPY WITH REMOVAL OF INTRABRONCHIAL VALVE (IBV);  Surgeon: Loreli Slot, MD;  Location: [facility] OR;  Service: Thoracic;  Laterality: N/A;   Social History:  reports that he quit smoking about 4 years ago. His smoking use included cigarettes. He started smoking about 42 years ago. He has a 9.5 pack-year smoking history. He has never used smokeless tobacco. He reports that he does not currently use alcohol. He reports that he does not currently use drugs.  Allergies  Allergen Reactions    Compazine [Prochlorperazine] Other (See Comments)    " made him high and could not sleep". He does not want to take it again.   Cyclobenzaprine Other (See Comments)    Hallucination    Gabapentin     Family History  Problem Relation Age of Onset   Colon cancer Mother    Heart disease Father    Heart disease Paternal Grandfather    Rectal cancer Other    Esophageal cancer Neg Hx    Stomach cancer Neg Hx     Prior to Admission medications   Medication Sig Start Date End Date Taking? Authorizing Provider  acetaminophen (TYLENOL) 500 MG tablet Take 1,000 mg by mouth as needed for moderate pain or mild pain.   Yes [provider]  amLODipine (NORVASC) 5 MG tablet Take 1 tablet (5 mg total) by mouth daily. Keep appt on 04/08/23 03/17/23  Yes Runell Gess, MD  aspirin EC 81 MG tablet Take 81 mg by mouth daily.   Yes [provider]  cetirizine (ZYRTEC) 10 MG tablet Take 10 mg by mouth at bedtime.   Yes [provider]  clopidogrel (PLAVIX) 75 MG tablet TAKE 1 TABLET BY MOUTH EVERY DAY 03/28/23  Yes Runell Gess, MD  esomeprazole (NEXIUM) 20 MG capsule Take 40 mg by mouth daily.   Yes [provider]  Fluticasone-Umeclidin-Vilant (TRELEGY ELLIPTA) 100-62.5-25 MCG/ACT AEPB Inhale 1 puff into the lungs daily. 03/12/23  Yes Nafziger, Kandee Keen, NP  Magnesium 500 MG TABS Take 1 tablet (500 mg total) by mouth in the morning and at bedtime. 06/26/22  Yes Gold, Wayne E, PA-C  metoprolol succinate (TOPROL-XL) 25 MG 24 hr tablet Take 1 tablet (25 mg total) by mouth daily. Please call office to schedule an appt for further refills. Thank you 03/06/23  Yes Runell Gess, MD  Multiple Vitamin (MULTIVITAMIN WITH MINERALS) TABS tablet Take 1 tablet by mouth daily.   Yes [provider]  ondansetron (ZOFRAN) 4 MG tablet TAKE 1 TABLET(4 MG) BY MOUTH EVERY 8 HOURS AS NEEDED FOR NAUSEA OR VOMITING 03/27/23  Yes Nafziger, Kandee Keen, NP  oxyCODONE (ROXICODONE) 15 MG  immediate release tablet Take 1 tablet (15 mg total) by mouth every 4 (four) hours as needed for pain. 04/02/23 05/02/23 Yes Nafziger, Kandee Keen, NP  mirtazapine (REMERON) 15 MG tablet TAKE 1 TABLET(15 MG) BY MOUTH AT BEDTIME Patient not taking: Reported on 04/07/2023 01/29/23   [name], NP  rosuvastatin (CRESTOR) 5 MG tablet TAKE 1 TABLET(5 MG) BY MOUTH EVERY OTHER DAY Patient not taking: Reported on 04/07/2023 09/23/22   Runell Gess, MD  sodium chloride 1 g tablet Take 1 g by mouth daily. Patient not taking: Reported on 04/07/2023    [provider]    Physical Exam: Vitals:   04/07/23 0314 04/07/23 0630 04/07/23 0647  BP: 128/82 (!) 128/92   Pulse:  73 80   Resp: 19 15   Temp: 98 F (36.7 C)  97.9 F (36.6 C)  TempSrc: Oral  Oral  SpO2: 95% 94%    Physical Exam Vitals and nursing note reviewed.  Constitutional:      General: He is awake. He is not in acute distress.    Appearance: He is ill-appearing.  HENT:     Head: Normocephalic.     Nose: No rhinorrhea.     Mouth/Throat:     Mouth: Mucous membranes are moist.  Eyes:     General: No scleral icterus.    Pupils: Pupils are equal, round, and reactive to light.  Neck:     Vascular: No JVD.  Cardiovascular:     Rate and Rhythm: Normal rate and regular rhythm.     Heart sounds: S1 normal and S2 normal.  Pulmonary:     Effort: Pulmonary effort is normal.     Breath sounds: Normal breath sounds. No wheezing, rhonchi or rales.  Abdominal:     General: Bowel sounds are normal.     Palpations: Abdomen is soft.  Musculoskeletal:     Cervical back: Neck supple.     Right lower leg: No edema.     Left lower leg: No edema.  Skin:    General: Skin is warm and dry.  Neurological:     General: No focal deficit present.     Mental Status: He is alert and oriented to person, place, and time.  Psychiatric:        Mood and Affect: Mood normal.        Behavior: Behavior normal. Behavior is cooperative.     Data  Reviewed:  Results are pending, will review when available.  05/05/2019 echocardiogram IMPRESSIONS:   1. Left ventricular ejection fraction, by visual estimation, is 60 to  65%. The left ventricle has normal function. There is mildly increased  left ventricular hypertrophy.   2. Left ventricular diastolic Doppler parameters are consistent with  pseudonormalization pattern of LV diastolic filling.   3. Global right ventricle has normal systolic function.The right  ventricular size is normal. No increase in right ventricular wall  thickness.   4. Left atrial size was normal.   5. Right atrial size was mildly dilated.   6. The mitral valve is normal in structure. No evidence of mitral valve  regurgitation. No evidence of mitral stenosis.   7. The tricuspid valve is grossly normal. Tricuspid valve regurgitation  is mild.   8. The aortic valve is normal in structure. Aortic valve regurgitation  was not visualized by color flow Doppler. Structurally normal aortic  valve, with no evidence of sclerosis or stenosis.   9. The pulmonic valve was grossly normal. Pulmonic valve regurgitation is  not visualized by color flow Doppler.  10. Moderately elevated pulmonary artery systolic pressure.  11. The atrial septum is grossly normal.   EKG: Vent. rate 71 BPM PR interval 128 ms QRS duration 65 ms QT/QTcB 463/504 ms P-R-T axes 108 84 217 Sinus rhythm  Assessment and Plan: Principal Problem:   Pneumonia Superimposed on:   Chronic obstructive pulmonary disease (HCC) And history of:   Adenocarcinoma of right lung, stage 4 (HCC) Admit to PCU/inpatient. Continue supplemental oxygen. Scheduled and as needed bronchodilators. Continue ceftriaxone 1 g IVPB daily. Continue azithromycin 500 mg IVPB daily. Continue prednisone Check strep pneumoniae urinary antigen. Check sputum Gram stain, culture and sensitivity. Follow-up blood culture and sensitivity. Follow-up CBC and chemistry in the  morning. Follow-up with oncology as an outpatient as scheduled.  Active Problems:   Hyponatremia Known issue. Fluid restriction. Continue normal saline infusion. Follow sodium level.    Essential hypertension Continue amlodipine 5 mg p.o. daily.   Continue metoprolol succinate 25 mg p.o. daily.    PVD (peripheral vascular disease) (HCC) Continue DAPT. Off antihyperlipidemic agent. Follow-up with vascular surgery as an outpatient.    Coronary atherosclerosis of native coronary artery Continue ASA, clopidogrel and beta-blocker. Currently not taking rosuvastatin.      GERD (gastroesophageal reflux disease) Continue esomeprazole or formulary equivalent.    Hyperlipidemia Off statin now. Patient to follow-up with PCP.    Hypocalcemia Recheck calcium with albumin level in AM.    Iron deficiency anemia Monitor hematocrit and hemoglobin.    Advance Care Planning:   Code Status: Full Code   Consults:   Family Communication: His wife was at bedside.  Severity of Illness: The appropriate patient status for this patient is INPATIENT. Inpatient status is judged to be reasonable and necessary in order to provide the required intensity of service to ensure the patient's safety. The patient's presenting symptoms, physical exam findings, and initial radiographic and laboratory data in the context of their chronic comorbidities is felt to place them at high risk for further clinical deterioration. Furthermore, it is not anticipated that the patient will be medically stable for discharge from the hospital within 2 midnights of admission.   * I certify that at the point of admission it is my clinical judgment that the patient will require inpatient hospital care spanning beyond 2 midnights from the point of admission due to high intensity of service, high risk for further deterioration and high frequency of surveillance required.*  Author: Bobette Mo, MD 04/07/2023 8:05 AM  For  on call review [email or web address].   This document was prepared using Dragon voice recognition software and may contain some unintended transcription errors.

## 2023-04-07 NOTE — Progress Notes (Signed)
Patient refused ECHO due to extreme discomfort.  Dondra Prader RVT RCS

## 2023-04-07 NOTE — ED Notes (Signed)
ED TO INPATIENT HANDOFF REPORT  Name/Age/Gender Sean Rose 67 y.o. male  Code Status    Code Status Orders  (From admission, onward)           Start     Ordered   04/07/23 0822  Full code  Continuous       Question:  By:  Answer:  Consent: discussion documented in EHR   04/07/23 0823           Code Status History     Date Active Date Inactive Code Status Order ID Comments User Context   12/06/2022 1449 12/09/2022 1446 DNR [id number]  Calvert Cantor, MD Inpatient   12/03/2022 1532 12/06/2022 1449 Full Code [id number]  Alba Cory, MD ED   07/04/2022 1551 07/11/2022 1930 Full Code [id number]  Rowe Clack, PA-C ED   03/13/2022 2310 03/14/2022 1757 Full Code [id number]  Hillary Bow, DO ED   03/01/2021 1819 03/03/2021 0133 Full Code [id number]  Orland Mustard, MD ED   12/07/2018 1855 12/07/2018 2234 Full Code [id number]  Jerre Simon, PA ED   09/15/2018 0723 09/24/2018 1208 Full Code [id number]  Leary Roca, PA-C Inpatient   09/02/2018 1511 09/15/2018 0723 Full Code [id number]  Josephine Igo, DO Inpatient   10/30/2016 1901 10/31/2016 1705 Full Code [id number]  Briscoe Deutscher, MD Inpatient   04/13/2015 1646 04/14/2015 1553 Full Code [id number]  Runell Gess, MD Inpatient       Home/SNF/Other Home  Chief Complaint Pneumonia [J18.9]  Level of Care/Admitting Diagnosis ED Disposition     ED Disposition  Admit   Condition  --   Comment  Hospital Area: [facility] [[id number]]  Level of Care: Telemetry [5]  Admit to tele based on following criteria: Monitor QTC interval  May admit patient to [facility] or [facility] if equivalent level of care is available:: Yes  Covid Evaluation: Asymptomatic - no recent exposure (last 10 days) testing not required  Diagnosis: Pneumonia [227785]  Admitting Physician: Bobette Mo [[id number]]  Attending Physician: Bobette Mo [[id number]]  Certification:: I certify this patient will need inpatient  services for at least 2 midnights  Expected Medical Readiness: 04/09/2023          Medical History Past Medical History:  Diagnosis Date   Allergy    Anemia    Anginal pain (HCC)    Blood transfusion without reported diagnosis    CAD (coronary artery disease)    BMS to RCA 2007. Low risk nuclear stress 2020.   CAP (community acquired pneumonia) 09/2016   COPD (chronic obstructive pulmonary disease) (HCC)    Dyspnea    occasionally   Emphysema of lung (HCC)    GERD (gastroesophageal reflux disease)    Heart murmur    "I was told I had one when I was a kid"   Hemorrhoids    History of anal fissures    "no surgeries" (10/30/2016)   Hyperlipidemia    Hypertension    lung ca dx'd 08/2018   with mets to bones in arms   Myocardial infarction [facility])    Peripheral arterial disease (HCC)    status post right common iliac artery stenting back in 2007   Seasonal allergies    Tobacco abuse     Allergies Allergies  Allergen Reactions   Compazine [Prochlorperazine] Other (See Comments)    " made him high and could not sleep". He does not want to take it again.  Cyclobenzaprine Other (See Comments)    Hallucination    Gabapentin     IV Location/Drains/Wounds Patient Lines/Drains/Airways Status     Active Line/Drains/Airways     Name Placement date Placement time Site Days   Implanted Port 11/13/20 Right Chest 11/13/20  1452  Chest  875            Labs/Imaging Results for orders placed or performed during the hospital encounter of 04/07/23 (from the past 48 hour(s))  CBC with Differential     Status: Abnormal   Collection Time: 04/07/23  4:02 AM  Result Value Ref Range   WBC 12.6 (H) 4.0 - 10.5 K/uL   RBC 3.30 (L) 4.22 - 5.81 MIL/uL   Hemoglobin 10.4 (L) 13.0 - 17.0 g/dL   HCT 27.2 (L) 53.6 - 64.4 %   MCV 95.8 80.0 - 100.0 fL   MCH 31.5 26.0 - 34.0 pg   MCHC 32.9 30.0 - 36.0 g/dL   RDW 03.4 74.2 - 59.5 %   Platelets 524 (H) 150 - 400 K/uL   nRBC 0.0 0.0 - 0.2  %   Neutrophils Relative % 81 %   Neutro Abs 10.1 (H) 1.7 - 7.7 K/uL   Lymphocytes Relative 10 %   Lymphs Abs 1.3 0.7 - 4.0 K/uL   Monocytes Relative 7 %   Monocytes Absolute 0.9 0.1 - 1.0 K/uL   Eosinophils Relative 1 %   Eosinophils Absolute 0.1 0.0 - 0.5 K/uL   Basophils Relative 0 %   Basophils Absolute 0.0 0.0 - 0.1 K/uL   Immature Granulocytes 1 %   Abs Immature Granulocytes 0.06 0.00 - 0.07 K/uL    Comment: Performed at [facility], [address], [place], [place] [address]  Basic metabolic panel     Status: Abnormal   Collection Time: 04/07/23  4:02 AM  Result Value Ref Range   Sodium 126 (L) 135 - 145 mmol/L   Potassium 3.6 3.5 - 5.1 mmol/L   Chloride 91 (L) 98 - 111 mmol/L   CO2 24 22 - 32 mmol/L   Glucose, Bld 112 (H) 70 - 99 mg/dL    Comment: Glucose reference range applies only to samples taken after fasting for at least 8 hours.   BUN 20 8 - 23 mg/dL   Creatinine, Ser 6.43 0.61 - 1.24 mg/dL   Calcium 8.6 (L) 8.9 - 10.3 mg/dL   GFR, Estimated >32 >95 mL/min    Comment: (NOTE) Calculated using the CKD-EPI Creatinine Equation (2021)    Anion gap 11 5 - 15    Comment: Performed at [facility], [address], [place], [place] [address]  Troponin I (High Sensitivity)     Status: None   Collection Time: 04/07/23  4:02 AM  Result Value Ref Range   Troponin I (High Sensitivity) 6 <18 ng/L    Comment: (NOTE) Elevated high sensitivity troponin I (hsTnI) values and significant  changes across serial measurements may suggest ACS but many other  chronic and acute conditions are known to elevate hsTnI results.  Refer to the "Links" section for chest pain algorithms and additional  guidance. Performed at [facility], [address] [place], [place] [address]   Brain natriuretic peptide     Status: Abnormal   Collection Time: 04/07/23  4:02 AM  Result Value Ref Range   B Natriuretic Peptide 218.9 (H) 0.0 -  100.0 pg/mL    Comment: Performed at [facility], [address] [place], [place] [address]  Resp panel by RT-PCR (RSV, Flu A&B, Covid) Anterior Nasal Swab     Status: None   Collection Time: 04/07/23  4:44 AM   Specimen: Anterior Nasal Swab  Result Value Ref Range   SARS Coronavirus 2 by RT PCR NEGATIVE NEGATIVE    Comment: (NOTE) SARS-CoV-2 target nucleic acids are NOT DETECTED.  The SARS-CoV-2 RNA is generally detectable in upper respiratory specimens during the acute phase of infection. The lowest concentration of SARS-CoV-2 viral copies this assay can detect is 138 copies/mL. A negative result does not preclude SARS-Cov-2 infection and should not be used as the sole basis for treatment or other patient management decisions. A negative result may occur with  improper specimen collection/handling, submission of specimen other than nasopharyngeal swab, presence of viral mutation(s) within the areas targeted by this assay, and inadequate number of viral copies(<138 copies/mL). A negative result must be combined with clinical observations, patient history, and epidemiological information. The expected result is Negative.  Fact Sheet for Patients:  BloggerCourse.com  Fact Sheet for Healthcare Providers:  SeriousBroker.it  This test is no t yet approved or cleared by the [place] FDA and  has been authorized for detection and/or diagnosis of SARS-CoV-2 by FDA under an Emergency Use Authorization (EUA). This EUA will remain  in effect (meaning this test can be used) for the duration of the COVID-19 declaration under Section 564(b)(1) of the Act, 21 U.S.C.section 360bbb-3(b)(1), unless the authorization is terminated  or revoked sooner.       Influenza A by PCR NEGATIVE NEGATIVE   Influenza B by PCR NEGATIVE NEGATIVE    Comment: (NOTE) The Xpert Xpress SARS-CoV-2/FLU/RSV plus assay is intended as an  aid in the diagnosis of influenza from Nasopharyngeal swab specimens and should not be used as a sole basis for treatment. Nasal washings and aspirates are unacceptable for Xpert Xpress SARS-CoV-2/FLU/RSV testing.  Fact Sheet for Patients: BloggerCourse.com  Fact Sheet for Healthcare Providers: SeriousBroker.it  This test is not yet approved or cleared by the [place] FDA and has been authorized for detection and/or diagnosis of SARS-CoV-2 by FDA under an Emergency Use Authorization (EUA). This EUA will remain in effect (meaning this test can be used) for the duration of the COVID-19 declaration under Section 564(b)(1) of the Act, 21 U.S.C. section 360bbb-3(b)(1), unless the authorization is terminated or revoked.     Resp Syncytial Virus by PCR NEGATIVE NEGATIVE    Comment: (NOTE) Fact Sheet for Patients: BloggerCourse.com  Fact Sheet for Healthcare Providers: SeriousBroker.it  This test is not yet approved or cleared by the [place] FDA and has been authorized for detection and/or diagnosis of SARS-CoV-2 by FDA under an Emergency Use Authorization (EUA). This EUA will remain in effect (meaning this test can be used) for the duration of the COVID-19 declaration under Section 564(b)(1) of the Act, 21 U.S.C. section 360bbb-3(b)(1), unless the authorization is terminated or revoked.  Performed at [facility], [address] [place], [place] [address]   Troponin I (High Sensitivity)     Status: None   Collection Time: 04/07/23  5:57 AM  Result Value Ref Range   Troponin I (High Sensitivity) 6 <18 ng/L    Comment: (NOTE) Elevated high sensitivity troponin I (hsTnI) values and significant  changes across serial measurements may suggest ACS but many other  chronic and acute conditions are known to elevate hsTnI results.  Refer to the "Links"  section for chest pain  algorithms and additional  guidance. Performed at [facility], [address], [place], [place] [address]   I-stat chem 8, ED (not at [facility] or [facility])     Status: Abnormal   Collection Time: 04/07/23  6:18 AM  Result Value Ref Range   Sodium 128 (L) 135 - 145 mmol/L   Potassium 3.9 3.5 - 5.1 mmol/L   Chloride 90 (L) 98 - 111 mmol/L   BUN 19 8 - 23 mg/dL   Creatinine, Ser 1.66 0.61 - 1.24 mg/dL   Glucose, Bld 063 (H) 70 - 99 mg/dL    Comment: Glucose reference range applies only to samples taken after fasting for at least 8 hours.   Calcium, Ion 1.23 1.15 - 1.40 mmol/L   TCO2 27 22 - 32 mmol/L   Hemoglobin 10.5 (L) 13.0 - 17.0 g/dL   HCT 01.6 (L) 01.0 - 93.2 %   CT Angio Chest PE W and/or Wo Contrast  Result Date: 04/07/2023 CLINICAL DATA:  Personal history of lung cancer. Shortness of breath with pain under the left chest/rib cage. EXAM: CT ANGIOGRAPHY CHEST WITH CONTRAST TECHNIQUE: Multidetector CT imaging of the chest was performed using the standard protocol during bolus administration of intravenous contrast. Multiplanar CT image reconstructions and MIPs were obtained to evaluate the vascular anatomy. RADIATION DOSE REDUCTION: This exam was performed according to the departmental dose-optimization program which includes automated exposure control, adjustment of the mA and/or kV according to patient size and/or use of iterative reconstruction technique. CONTRAST:  75mL OMNIPAQUE IOHEXOL 350 MG/ML SOLN COMPARISON:  CT chest 02/17/2023 FINDINGS: Cardiovascular: Heart size upper normal. No pericardial effusion. Coronary artery calcification is evident. Moderate atherosclerotic calcification is noted in the wall of the thoracic aorta. No dissection of the thoracic aorta. There is no filling defect within the opacified pulmonary arteries to suggest the presence of an acute pulmonary embolus. Mediastinum/Nodes: Nodular soft tissue posterior to the trachea  on 49/5 is presumably related to the proximal esophagus. No definite mediastinal lymphadenopathy. There is no hilar lymphadenopathy. There is no axillary lymphadenopathy. Lungs/Pleura: Advanced changes of emphysema noted bilaterally with bullous disease in the upper lungs, right greater than left. Postsurgical changes again noted right hemithorax. The bandlike opacity noted lateral left lung apex is stable, likely scarring. Architectural distortion is noted in both lungs with similar chronic atelectasis/scarring in the dependent lower lungs bilaterally. There is some new ground-glass opacity in the anterior left base (see image 130/13) with similar chronic fluid in the right costophrenic sulcus. High density along the dependent inferior left pleural may reflect sequelae of prior pleurodesis. Upper Abdomen: Relatively diffuse wall thickening noted in the stomach, similar to prior. Musculoskeletal: No worrisome lytic or sclerotic osseous abnormality. Stable mild compression deformity at T7. Review of the MIP images confirms the above findings. IMPRESSION: 1. No CT evidence for acute pulmonary embolus. 2. New ground-glass opacity in the anterior left base. Imaging features suspicious for developing infection. 3. Scattered areas of chronic parenchymal disease most suggestive of scarring. 4. Relatively diffuse wall thickening in the stomach, similar to prior. Correlation for symptoms of gastritis recommended. 5. Aortic Atherosclerosis (ICD10-I70.0) and Emphysema (ICD10-J43.9). Electronically Signed   By: Kennith Center M.D.   On: 04/07/2023 05:51    Pending Labs Unresulted Labs (From admission, onward)     Start     Ordered   04/08/23 0500  HIV Antibody (routine testing w rflx)  (HIV Antibody (Routine testing w reflex) panel)  Tomorrow morning,   R  04/07/23 0823   04/08/23 0500  CBC  Tomorrow morning,   R        04/07/23 0823   04/08/23 0500  Comprehensive metabolic panel  Tomorrow morning,   R         04/07/23 0823            Vitals/Pain Today's Vitals   04/07/23 1200 04/07/23 1214 04/07/23 1225 04/07/23 1300  BP: (!) 145/87   115/77  Pulse: 79   73  Resp: (!) 9   15  Temp:  97.8 F (36.6 C)    TempSrc:  Oral    SpO2: 100%   100%  PainSc:   7      Isolation Precautions No active isolations  Medications Medications  0.9 %  sodium chloride infusion ( Intravenous Rate/Dose Change 04/07/23 0852)  acetaminophen (TYLENOL) tablet 650 mg (has no administration in time range)    Or  acetaminophen (TYLENOL) suppository 650 mg (has no administration in time range)  ondansetron (ZOFRAN) tablet 4 mg (has no administration in time range)    Or  ondansetron (ZOFRAN) injection 4 mg (has no administration in time range)  aspirin EC tablet 81 mg (81 mg Oral Given 04/07/23 1039)  amLODipine (NORVASC) tablet 5 mg (5 mg Oral Given 04/07/23 1039)  clopidogrel (PLAVIX) tablet 75 mg (75 mg Oral Given 04/07/23 1039)  pantoprazole (PROTONIX) EC tablet 40 mg (40 mg Oral Given 04/07/23 1039)  fluticasone furoate-vilanterol (BREO ELLIPTA) 100-25 MCG/ACT 1 puff (1 puff Inhalation Given 04/07/23 1135)    And  umeclidinium bromide (INCRUSE ELLIPTA) 62.5 MCG/ACT 1 puff (1 puff Inhalation Given 04/07/23 1135)  metoprolol succinate (TOPROL-XL) 24 hr tablet 25 mg (25 mg Oral Given 04/07/23 1039)  magnesium oxide (MAG-OX) tablet 400 mg (has no administration in time range)  ipratropium-albuterol (DUONEB) 0.5-2.5 (3) MG/3ML nebulizer solution 1.5 mL (has no administration in time range)  oxyCODONE (Oxy IR/ROXICODONE) immediate release tablet 15 mg (has no administration in time range)  fentaNYL (SUBLIMAZE) injection 25 mcg (25 mcg Intravenous Given 04/07/23 0501)  iohexol (OMNIPAQUE) 350 MG/ML injection 75 mL (75 mLs Intravenous Contrast Given 04/07/23 0504)  cefTRIAXone (ROCEPHIN) 1 g in sodium chloride 0.9 % 100 mL IVPB (0 g Intravenous Stopped 04/07/23 0703)  azithromycin (ZITHROMAX) 500 mg in sodium chloride 0.9 % 250 mL  IVPB (0 mg Intravenous Stopped 04/07/23 0805)  HYDROmorphone (DILAUDID) injection 1 mg (1 mg Intravenous Given 04/07/23 1135)    Mobility non-ambulatory

## 2023-04-07 NOTE — ED Provider Notes (Signed)
[place] EMERGENCY DEPARTMENT AT [facility] Provider Note   CSN: [id number] Arrival date & time: 04/07/23  0254     History  Chief Complaint  Patient presents with   Shortness of Breath    Sean Rose is a 67 y.o. male.  The history is provided by the patient and the spouse.  Shortness of Breath Severity:  Severe Onset quality:  Gradual Duration:  1 day Timing:  Constant Progression:  Worsening Chronicity:  Recurrent Context: not URI   Relieved by:  Nothing Worsened by:  Nothing Ineffective treatments:  None tried Associated symptoms: chest pain   Associated symptoms: no fever and no vomiting   Risk factors: no family hx of DVT and no obesity   Risk factors comment:  Stage 4 lung cancer Patient with stage 4 adenocarcinoma of the Lung  and COPD presents with left lower CP with inspiration, hypoxia and SOB.      Past Medical History:  Diagnosis Date   Allergy    Anemia    Anginal pain (HCC)    Blood transfusion without reported diagnosis    CAD (coronary artery disease)    BMS to RCA 2007. Low risk nuclear stress 2020.   CAP (community acquired pneumonia) 09/2016   COPD (chronic obstructive pulmonary disease) (HCC)    Dyspnea    occasionally   Emphysema of lung (HCC)    GERD (gastroesophageal reflux disease)    Heart murmur    "I was told I had one when I was a kid"   Hemorrhoids    History of anal fissures    "no surgeries" (10/30/2016)   Hyperlipidemia    Hypertension    lung ca dx'd 08/2018   with mets to bones in [place]   Myocardial infarction [facility])    Peripheral arterial disease (HCC)    status post right common iliac artery stenting back in 2007   Seasonal allergies    Tobacco abuse      Home Medications Prior to Admission medications   Medication Sig Start Date End Date Taking? Authorizing Provider  acetaminophen (TYLENOL) 500 MG tablet Take 1,000 mg by mouth as needed for moderate pain or mild pain.   Yes [provider]   amLODipine (NORVASC) 5 MG tablet Take 1 tablet (5 mg total) by mouth daily. Keep appt on 04/08/23 03/17/23  Yes Runell Gess, MD  aspirin EC 81 MG tablet Take 81 mg by mouth daily.   Yes [provider]  cetirizine (ZYRTEC) 10 MG tablet Take 10 mg by mouth at bedtime.   Yes [provider]  clopidogrel (PLAVIX) 75 MG tablet TAKE 1 TABLET BY MOUTH EVERY DAY 03/28/23  Yes Runell Gess, MD  esomeprazole (NEXIUM) 20 MG capsule Take 40 mg by mouth daily.   Yes [provider]  Fluticasone-Umeclidin-Vilant (TRELEGY ELLIPTA) 100-62.5-25 MCG/ACT AEPB Inhale 1 puff into the lungs daily. 03/12/23  Yes Nafziger, Kandee Keen, NP  Magnesium 500 MG TABS Take 1 tablet (500 mg total) by mouth in the morning and at bedtime. 06/26/22  Yes Gold, Wayne E, PA-C  metoprolol succinate (TOPROL-XL) 25 MG 24 hr tablet Take 1 tablet (25 mg total) by mouth daily. Please call office to schedule an appt for further refills. Thank you 03/06/23  Yes Runell Gess, MD  Multiple Vitamin (MULTIVITAMIN WITH MINERALS) TABS tablet Take 1 tablet by mouth daily.   Yes [provider]  ondansetron (ZOFRAN) 4 MG tablet TAKE 1 TABLET(4 MG) BY MOUTH  EVERY 8 HOURS AS NEEDED FOR NAUSEA OR VOMITING 03/27/23  Yes Nafziger, Kandee Keen, NP  oxyCODONE (ROXICODONE) 15 MG immediate release tablet Take 1 tablet (15 mg total) by mouth every 4 (four) hours as needed for pain. 04/02/23 05/02/23 Yes Nafziger, Kandee Keen, NP  mirtazapine (REMERON) 15 MG tablet TAKE 1 TABLET(15 MG) BY MOUTH AT BEDTIME Patient not taking: Reported on 04/07/2023 01/29/23   [name], NP  rosuvastatin (CRESTOR) 5 MG tablet TAKE 1 TABLET(5 MG) BY MOUTH EVERY OTHER DAY Patient not taking: Reported on 04/07/2023 09/23/22   Runell Gess, MD  sodium chloride 1 g tablet Take 1 g by mouth daily. Patient not taking: Reported on 04/07/2023    [provider]      Allergies    Compazine [prochlorperazine], Cyclobenzaprine, and Gabapentin    Review of  Systems   Review of Systems  Constitutional:  Negative for fever.  HENT:  Negative for facial swelling.   Respiratory:  Positive for shortness of breath.   Cardiovascular:  Positive for chest pain.  Gastrointestinal:  Negative for vomiting.  All other systems reviewed and are negative.   Physical Exam Updated Vital Signs BP 128/82   Pulse 73   Temp 98 F (36.7 C) (Oral)   Resp 19   SpO2 95%  Physical Exam Vitals and nursing note reviewed.  Constitutional:      General: He is not in acute distress.    Appearance: He is cachectic. He is not diaphoretic.  HENT:     Head: Normocephalic and atraumatic.     Nose: Nose normal.  Eyes:     Conjunctiva/sclera: Conjunctivae normal.     Pupils: Pupils are equal, round, and reactive to light.  Cardiovascular:     Rate and Rhythm: Normal rate and regular rhythm.     Pulses: Normal pulses.     Heart sounds: Normal heart sounds.  Pulmonary:     Effort: Pulmonary effort is normal.     Breath sounds: Decreased breath sounds present. No wheezing or rales.  Abdominal:     General: Bowel sounds are normal.     Palpations: Abdomen is soft.     Tenderness: There is no abdominal tenderness. There is no guarding or rebound.  Musculoskeletal:        General: Normal range of motion.     Cervical back: Normal range of motion and neck supple.     Right lower leg: No edema.     Left lower leg: No edema.  Skin:    General: Skin is warm and dry.     Capillary Refill: Capillary refill takes less than 2 seconds.  Neurological:     General: No focal deficit present.     Mental Status: He is oriented to person, place, and time.     Deep Tendon Reflexes: Reflexes normal.  Psychiatric:        Thought Content: Thought content normal.     ED Results / Procedures / Treatments   Labs (all labs ordered are listed, but only abnormal results are displayed) Results for orders placed or performed during the hospital encounter of 04/07/23  CBC with  Differential  Result Value Ref Range   WBC 12.6 (H) 4.0 - 10.5 K/uL   RBC 3.30 (L) 4.22 - 5.81 MIL/uL   Hemoglobin 10.4 (L) 13.0 - 17.0 g/dL   HCT 96.0 (L) 45.4 - 09.8 %   MCV 95.8 80.0 - 100.0 fL   MCH 31.5 26.0 - 34.0 pg  MCHC 32.9 30.0 - 36.0 g/dL   RDW 66.4 40.3 - 47.4 %   Platelets 524 (H) 150 - 400 K/uL   nRBC 0.0 0.0 - 0.2 %   Neutrophils Relative % 81 %   Neutro Abs 10.1 (H) 1.7 - 7.7 K/uL   Lymphocytes Relative 10 %   Lymphs Abs 1.3 0.7 - 4.0 K/uL   Monocytes Relative 7 %   Monocytes Absolute 0.9 0.1 - 1.0 K/uL   Eosinophils Relative 1 %   Eosinophils Absolute 0.1 0.0 - 0.5 K/uL   Basophils Relative 0 %   Basophils Absolute 0.0 0.0 - 0.1 K/uL   Immature Granulocytes 1 %   Abs Immature Granulocytes 0.06 0.00 - 0.07 K/uL  Basic metabolic panel  Result Value Ref Range   Sodium 126 (L) 135 - 145 mmol/L   Potassium 3.6 3.5 - 5.1 mmol/L   Chloride 91 (L) 98 - 111 mmol/L   CO2 24 22 - 32 mmol/L   Glucose, Bld 112 (H) 70 - 99 mg/dL   BUN 20 8 - 23 mg/dL   Creatinine, Ser 2.59 0.61 - 1.24 mg/dL   Calcium 8.6 (L) 8.9 - 10.3 mg/dL   GFR, Estimated >56 >38 mL/min   Anion gap 11 5 - 15  Brain natriuretic peptide  Result Value Ref Range   B Natriuretic Peptide 218.9 (H) 0.0 - 100.0 pg/mL  Troponin I (High Sensitivity)  Result Value Ref Range   Troponin I (High Sensitivity) 6 <18 ng/L   No results found.  EKG EKG Interpretation Date/Time:  Monday April 07 2023 03:35:56 EDT Ventricular Rate:  75 PR Interval:  124 QRS Duration:  68 QT Interval:  444 QTC Calculation: 495 R Axis:   84  Text Interpretation: Normal sinus rhythm ST & T changes diffuse Confirmed by Vernell Back ([id number]) on 04/07/2023 4:44:23 AM  Radiology No results found.  Procedures Procedures    Medications Ordered in ED Medications  0.9 %  sodium chloride infusion (has no administration in time range)  fentaNYL (SUBLIMAZE) injection 25 mcg (25 mcg Intravenous Given 04/07/23 0501)  iohexol  (OMNIPAQUE) 350 MG/ML injection 75 mL (75 mLs Intravenous Contrast Given 04/07/23 0504)    ED Course/ Medical Decision Making/ A&P                                 Medical Decision Making SOB and LL CP x 1 dayd   Amount and/or Complexity of Data Reviewed Independent Historian: spouse and EMS External Data Reviewed: labs and notes. Labs: ordered.    Details: Sodium low 126, normal potassium 3.6, normal creatinine.  BNP elevated 218.  Normal troponin 6.   Radiology: ordered and independent interpretation performed.    Details: No PE by me, effusions and PNA ECG/medicine tests: ordered and independent interpretation performed.  Risk Prescription drug management. Decision regarding hospitalization.    Final Clinical Impression(s) / ED Diagnoses Final diagnoses:  Primary malignant neoplasm of lung metastatic to other site, unspecified laterality (HCC)  Cachexia (HCC)  Hyponatremia   The patient appears reasonably stabilized for admission considering the current resources, flow, and capabilities available in the ED at this time, and I doubt any other [facility] requiring further screening and/or treatment in the ED prior to admission.  Rx / DC Orders ED Discharge Orders     None         Lennyx Verdell, MD 04/07/23 [phone]

## 2023-04-07 NOTE — ED Notes (Signed)
ED TO INPATIENT HANDOFF REPORT  Name/Age/Gender Sean Rose 67 y.o. male  Code Status Code Status History     Date Active Date Inactive Code Status Order ID Comments User Context   12/06/2022 1449 12/09/2022 1446 DNR [id number]  Calvert Cantor, MD Inpatient   12/03/2022 1532 12/06/2022 1449 Full Code [id number]  Alba Cory, MD ED   07/04/2022 1551 07/11/2022 1930 Full Code [id number]  Rowe Clack, PA-C ED   03/13/2022 2310 03/14/2022 1757 Full Code [id number]  Hillary Bow, DO ED   03/01/2021 1819 03/03/2021 0133 Full Code [id number]  Orland Mustard, MD ED   12/07/2018 1855 12/07/2018 2234 Full Code [id number]  Jerre Simon, PA ED   09/15/2018 0723 09/24/2018 1208 Full Code [id number]  Leary Roca, PA-C Inpatient   09/02/2018 1511 09/15/2018 0723 Full Code [id number]  Josephine Igo, DO Inpatient   10/30/2016 1901 10/31/2016 1705 Full Code [id number]  Briscoe Deutscher, MD Inpatient   04/13/2015 1646 04/14/2015 1553 Full Code [id number]  Runell Gess, MD Inpatient    Questions for Most Recent Historical Code Status (Order [id number])     Question Answer   If patient has no pulse and is not breathing Do Not Attempt Resuscitation   If patient has a pulse and/or is breathing: Medical Treatment Goals LIMITED ADDITIONAL INTERVENTIONS: Use medication/IV fluids and cardiac monitoring as indicated; Do not use intubation or mechanical ventilation (DNI), also provide comfort medications.  Transfer to Progressive/Stepdown as indicated, avoid Intensive Care.   Consent: Discussion documented in EHR or advanced directives reviewed            Home/SNF/Other Home  Chief Complaint Pneumonia [J18.9]  Level of Care/Admitting Diagnosis ED Disposition     ED Disposition  Admit   Condition  --   Comment  Hospital Area: [facility] [[id number]]  Level of Care: Stepdown [14]  Admit to SDU based on following criteria: Hemodynamic compromise or significant risk of instability:   Patient requiring short term acute titration and management of vasoactive drips, and invasive monitoring (i.e., CVP and Arterial line).  May admit patient to [facility] or [facility] if equivalent level of care is available:: Yes  Covid Evaluation: Asymptomatic - no recent exposure (last 10 days) testing not required  Diagnosis: Pneumonia [227785]  Admitting Physician: Darlin Drop [[id number]]  Attending Physician: Darlin Drop [[id number]]  Certification:: I certify this patient will need inpatient services for at least 2 midnights  Expected Medical Readiness: 04/09/2023          Medical History Past Medical History:  Diagnosis Date   Allergy    Anemia    Anginal pain (HCC)    Blood transfusion without reported diagnosis    CAD (coronary artery disease)    BMS to RCA 2007. Low risk nuclear stress 2020.   CAP (community acquired pneumonia) 09/2016   COPD (chronic obstructive pulmonary disease) (HCC)    Dyspnea    occasionally   Emphysema of lung (HCC)    GERD (gastroesophageal reflux disease)    Heart murmur    "I was told I had one when I was a kid"   Hemorrhoids    History of anal fissures    "no surgeries" (10/30/2016)   Hyperlipidemia    Hypertension    lung ca dx'd 08/2018   with mets to bones in [place]   Myocardial infarction [facility])    Peripheral arterial disease (HCC)    status post right common  iliac artery stenting back in 2007   Seasonal allergies    Tobacco abuse     Allergies Allergies  Allergen Reactions   Compazine [Prochlorperazine] Other (See Comments)    " made him high and could not sleep". He does not want to take it again.   Cyclobenzaprine Other (See Comments)    Hallucination    Gabapentin     IV Location/Drains/Wounds Patient Lines/Drains/Airways Status     Active Line/Drains/Airways     Name Placement date Placement time Site Days   Implanted Port 11/13/20 Right Chest 11/13/20  1452  Chest  875            Labs/Imaging Results for  orders placed or performed during the hospital encounter of 04/07/23 (from the past 48 hour(s))  CBC with Differential     Status: Abnormal   Collection Time: 04/07/23  4:02 AM  Result Value Ref Range   WBC 12.6 (H) 4.0 - 10.5 K/uL   RBC 3.30 (L) 4.22 - 5.81 MIL/uL   Hemoglobin 10.4 (L) 13.0 - 17.0 g/dL   HCT 14.7 (L) 82.9 - 56.2 %   MCV 95.8 80.0 - 100.0 fL   MCH 31.5 26.0 - 34.0 pg   MCHC 32.9 30.0 - 36.0 g/dL   RDW 13.0 86.5 - 78.4 %   Platelets 524 (H) 150 - 400 K/uL   nRBC 0.0 0.0 - 0.2 %   Neutrophils Relative % 81 %   Neutro Abs 10.1 (H) 1.7 - 7.7 K/uL   Lymphocytes Relative 10 %   Lymphs Abs 1.3 0.7 - 4.0 K/uL   Monocytes Relative 7 %   Monocytes Absolute 0.9 0.1 - 1.0 K/uL   Eosinophils Relative 1 %   Eosinophils Absolute 0.1 0.0 - 0.5 K/uL   Basophils Relative 0 %   Basophils Absolute 0.0 0.0 - 0.1 K/uL   Immature Granulocytes 1 %   Abs Immature Granulocytes 0.06 0.00 - 0.07 K/uL    Comment: Performed at [facility], [address] [place], [place] [address]  Basic metabolic panel     Status: Abnormal   Collection Time: 04/07/23  4:02 AM  Result Value Ref Range   Sodium 126 (L) 135 - 145 mmol/L   Potassium 3.6 3.5 - 5.1 mmol/L   Chloride 91 (L) 98 - 111 mmol/L   CO2 24 22 - 32 mmol/L   Glucose, Bld 112 (H) 70 - 99 mg/dL    Comment: Glucose reference range applies only to samples taken after fasting for at least 8 hours.   BUN 20 8 - 23 mg/dL   Creatinine, Ser 5.28 0.61 - 1.24 mg/dL   Calcium 8.6 (L) 8.9 - 10.3 mg/dL   GFR, Estimated >41 >32 mL/min    Comment: (NOTE) Calculated using the CKD-EPI Creatinine Equation (2021)    Anion gap 11 5 - 15    Comment: Performed at [facility], [address], [place], [place] [address]  Troponin I (High Sensitivity)     Status: None   Collection Time: 04/07/23  4:02 AM  Result Value Ref Range   Troponin I (High Sensitivity) 6 <18 ng/L    Comment: (NOTE) Elevated high sensitivity  troponin I (hsTnI) values and significant  changes across serial measurements may suggest ACS but many other  chronic and acute conditions are known to elevate hsTnI results.  Refer to the "Links" section for chest pain algorithms and additional  guidance. Performed at [facility], [address] [place], [place] [address]   Brain natriuretic peptide     Status: Abnormal   Collection Time: 04/07/23  4:02 AM  Result Value Ref Range   B Natriuretic Peptide 218.9 (H) 0.0 - 100.0 pg/mL    Comment: Performed at [facility], [address], [place], [place] [address]  Resp panel by RT-PCR (RSV, Flu A&B, Covid) Anterior Nasal Swab     Status: None   Collection Time: 04/07/23  4:44 AM   Specimen: Anterior Nasal Swab  Result Value Ref Range   SARS Coronavirus 2 by RT PCR NEGATIVE NEGATIVE    Comment: (NOTE) SARS-CoV-2 target nucleic acids are NOT DETECTED.  The SARS-CoV-2 RNA is generally detectable in upper respiratory specimens during the acute phase of infection. The lowest concentration of SARS-CoV-2 viral copies this assay can detect is 138 copies/mL. A negative result does not preclude SARS-Cov-2 infection and should not be used as the sole basis for treatment or other patient management decisions. A negative result may occur with  improper specimen collection/handling, submission of specimen other than nasopharyngeal swab, presence of viral mutation(s) within the areas targeted by this assay, and inadequate number of viral copies(<138 copies/mL). A negative result must be combined with clinical observations, patient history, and epidemiological information. The expected result is Negative.  Fact Sheet for Patients:  BloggerCourse.com  Fact Sheet for Healthcare Providers:  SeriousBroker.it  This test is no t yet approved or cleared by the [place] FDA and  has been authorized for  detection and/or diagnosis of SARS-CoV-2 by FDA under an Emergency Use Authorization (EUA). This EUA will remain  in effect (meaning this test can be used) for the duration of the COVID-19 declaration under Section 564(b)(1) of the Act, 21 U.S.C.section 360bbb-3(b)(1), unless the authorization is terminated  or revoked sooner.       Influenza A by PCR NEGATIVE NEGATIVE   Influenza B by PCR NEGATIVE NEGATIVE    Comment: (NOTE) The Xpert Xpress SARS-CoV-2/FLU/RSV plus assay is intended as an aid in the diagnosis of influenza from Nasopharyngeal swab specimens and should not be used as a sole basis for treatment. Nasal washings and aspirates are unacceptable for Xpert Xpress SARS-CoV-2/FLU/RSV testing.  Fact Sheet for Patients: BloggerCourse.com  Fact Sheet for Healthcare Providers: SeriousBroker.it  This test is not yet approved or cleared by the [place] FDA and has been authorized for detection and/or diagnosis of SARS-CoV-2 by FDA under an Emergency Use Authorization (EUA). This EUA will remain in effect (meaning this test can be used) for the duration of the COVID-19 declaration under Section 564(b)(1) of the Act, 21 U.S.C. section 360bbb-3(b)(1), unless the authorization is terminated or revoked.     Resp Syncytial Virus by PCR NEGATIVE NEGATIVE    Comment: (NOTE) Fact Sheet for Patients: BloggerCourse.com  Fact Sheet for Healthcare Providers: SeriousBroker.it  This test is not yet approved or cleared by the [place] FDA and has been authorized for detection and/or diagnosis of SARS-CoV-2 by FDA under an Emergency Use Authorization (EUA). This EUA will remain in effect (meaning this test can be used) for the duration of the COVID-19 declaration under Section 564(b)(1) of the Act, 21 U.S.C. section 360bbb-3(b)(1), unless the authorization is terminated  or revoked.  Performed at [facility], [address] [place], [place] [address]   Troponin I (High Sensitivity)     Status: None   Collection Time: 04/07/23  5:57 AM  Result Value Ref Range  Troponin I (High Sensitivity) 6 <18 ng/L    Comment: (NOTE) Elevated high sensitivity troponin I (hsTnI) values and significant  changes across serial measurements may suggest ACS but many other  chronic and acute conditions are known to elevate hsTnI results.  Refer to the "Links" section for chest pain algorithms and additional  guidance. Performed at [facility], [address], [place], [place] [address]   I-stat chem 8, ED (not at [facility], [facility] or [facility])     Status: Abnormal   Collection Time: 04/07/23  6:18 AM  Result Value Ref Range   Sodium 128 (L) 135 - 145 mmol/L   Potassium 3.9 3.5 - 5.1 mmol/L   Chloride 90 (L) 98 - 111 mmol/L   BUN 19 8 - 23 mg/dL   Creatinine, Ser 6.04 0.61 - 1.24 mg/dL   Glucose, Bld 540 (H) 70 - 99 mg/dL    Comment: Glucose reference range applies only to samples taken after fasting for at least 8 hours.   Calcium, Ion 1.23 1.15 - 1.40 mmol/L   TCO2 27 22 - 32 mmol/L   Hemoglobin 10.5 (L) 13.0 - 17.0 g/dL   HCT 98.1 (L) 19.1 - 47.8 %   CT Angio Chest PE W and/or Wo Contrast  Result Date: 04/07/2023 CLINICAL DATA:  Personal history of lung cancer. Shortness of breath with pain under the left chest/rib cage. EXAM: CT ANGIOGRAPHY CHEST WITH CONTRAST TECHNIQUE: Multidetector CT imaging of the chest was performed using the standard protocol during bolus administration of intravenous contrast. Multiplanar CT image reconstructions and MIPs were obtained to evaluate the vascular anatomy. RADIATION DOSE REDUCTION: This exam was performed according to the departmental dose-optimization program which includes automated exposure control, adjustment of the mA and/or kV according to patient size and/or use of iterative reconstruction  technique. CONTRAST:  75mL OMNIPAQUE IOHEXOL 350 MG/ML SOLN COMPARISON:  CT chest 02/17/2023 FINDINGS: Cardiovascular: Heart size upper normal. No pericardial effusion. Coronary artery calcification is evident. Moderate atherosclerotic calcification is noted in the wall of the thoracic aorta. No dissection of the thoracic aorta. There is no filling defect within the opacified pulmonary arteries to suggest the presence of an acute pulmonary embolus. Mediastinum/Nodes: Nodular soft tissue posterior to the trachea on 49/5 is presumably related to the proximal esophagus. No definite mediastinal lymphadenopathy. There is no hilar lymphadenopathy. There is no axillary lymphadenopathy. Lungs/Pleura: Advanced changes of emphysema noted bilaterally with bullous disease in the upper lungs, right greater than left. Postsurgical changes again noted right hemithorax. The bandlike opacity noted lateral left lung apex is stable, likely scarring. Architectural distortion is noted in both lungs with similar chronic atelectasis/scarring in the dependent lower lungs bilaterally. There is some new ground-glass opacity in the anterior left base (see image 130/13) with similar chronic fluid in the right costophrenic sulcus. High density along the dependent inferior left pleural may reflect sequelae of prior pleurodesis. Upper Abdomen: Relatively diffuse wall thickening noted in the stomach, similar to prior. Musculoskeletal: No worrisome lytic or sclerotic osseous abnormality. Stable mild compression deformity at T7. Review of the MIP images confirms the above findings. IMPRESSION: 1. No CT evidence for acute pulmonary embolus. 2. New ground-glass opacity in the anterior left base. Imaging features suspicious for developing infection. 3. Scattered areas of chronic parenchymal disease most suggestive of scarring. 4. Relatively diffuse wall thickening in the stomach, similar to prior. Correlation for symptoms of gastritis recommended. 5.  Aortic Atherosclerosis (ICD10-I70.0) and Emphysema (ICD10-J43.9). Electronically Signed   By: Minerva Areola  Molli Posey M.D.   On: 04/07/2023 05:51    Pending Labs Unresulted Labs (From admission, onward)    None       Vitals/Pain Today's Vitals   04/07/23 0314 04/07/23 0557 04/07/23 0630 04/07/23 0647  BP: 128/82  (!) 128/92   Pulse: 73  80   Resp: 19  15   Temp: 98 F (36.7 C)   97.9 F (36.6 C)  TempSrc: Oral   Oral  SpO2: 95%  94%   PainSc:  2       Isolation Precautions No active isolations  Medications Medications  0.9 %  sodium chloride infusion ( Intravenous New Bag/Given 04/07/23 0555)  azithromycin (ZITHROMAX) 500 mg in sodium chloride 0.9 % 250 mL IVPB (500 mg Intravenous New Bag/Given 04/07/23 0704)  fentaNYL (SUBLIMAZE) injection 25 mcg (25 mcg Intravenous Given 04/07/23 0501)  iohexol (OMNIPAQUE) 350 MG/ML injection 75 mL (75 mLs Intravenous Contrast Given 04/07/23 0504)  cefTRIAXone (ROCEPHIN) 1 g in sodium chloride 0.9 % 100 mL IVPB (0 g Intravenous Stopped 04/07/23 0703)    Mobility walks

## 2023-04-08 ENCOUNTER — Ambulatory Visit: Payer: BC Managed Care – PPO | Admitting: Student

## 2023-04-08 ENCOUNTER — Inpatient Hospital Stay (HOSPITAL_COMMUNITY): Payer: BC Managed Care – PPO

## 2023-04-08 DIAGNOSIS — J189 Pneumonia, unspecified organism: Secondary | ICD-10-CM | POA: Diagnosis not present

## 2023-04-08 LAB — COMPREHENSIVE METABOLIC PANEL
ALT: 11 U/L (ref 0–44)
AST: 17 U/L (ref 15–41)
Albumin: 2.7 g/dL — ABNORMAL LOW (ref 3.5–5.0)
Alkaline Phosphatase: 86 U/L (ref 38–126)
Anion gap: 11 (ref 5–15)
BUN: 16 mg/dL (ref 8–23)
CO2: 26 mmol/L (ref 22–32)
Calcium: 8.6 mg/dL — ABNORMAL LOW (ref 8.9–10.3)
Chloride: 92 mmol/L — ABNORMAL LOW (ref 98–111)
Creatinine, Ser: 1.06 mg/dL (ref 0.61–1.24)
GFR, Estimated: >60 mL/min (ref >60)
Glucose, Bld: 67 mg/dL — ABNORMAL LOW (ref 70–99)
Potassium: 3.4 mmol/L — ABNORMAL LOW (ref 3.5–5.1)
Sodium: 129 mmol/L — ABNORMAL LOW (ref 135–145)
Total Bilirubin: 0.6 mg/dL (ref 0.3–1.2)
Total Protein: 6.7 g/dL (ref 6.5–8.1)

## 2023-04-08 LAB — CBC
HCT: 30.1 % — ABNORMAL LOW (ref 39.0–52.0)
Hemoglobin: 9.8 g/dL — ABNORMAL LOW (ref 13.0–17.0)
MCH: 32 pg (ref 26.0–34.0)
MCHC: 32.6 g/dL (ref 30.0–36.0)
MCV: 98.4 fL (ref 80.0–100.0)
Platelets: 459 10*3/uL — ABNORMAL HIGH (ref 150–400)
RBC: 3.06 MIL/uL — ABNORMAL LOW (ref 4.22–5.81)
RDW: 14.6 % (ref 11.5–15.5)
WBC: 8.8 10*3/uL (ref 4.0–10.5)
nRBC: 0 % (ref 0.0–0.2)

## 2023-04-08 LAB — HIV ANTIBODY (ROUTINE TESTING W REFLEX): HIV Screen 4th Generation wRfx: NONREACTIVE

## 2023-04-08 MED ORDER — SODIUM CHLORIDE 0.9% FLUSH
10.0000 mL | Freq: Two times a day (BID) | INTRAVENOUS | Status: DC
  Administered 2023-04-08 (×2): 10 mL

## 2023-04-08 MED ORDER — ALPRAZOLAM 0.25 MG PO TABS
0.2500 mg | ORAL_TABLET | Freq: Two times a day (BID) | ORAL | Status: DC | PRN
  Administered 2023-04-08 – 2023-04-09 (×4): 0.25 mg via ORAL
  Filled 2023-04-08 (×4): qty 1

## 2023-04-08 MED ORDER — SODIUM CHLORIDE 0.9% FLUSH: 10.0000 mL | INTRAVENOUS | Status: DC | PRN

## 2023-04-08 MED ORDER — CHLORHEXIDINE GLUCONATE CLOTH 2 % EX PADS
6.0000 | MEDICATED_PAD | Freq: Every day | CUTANEOUS | Status: DC
  Administered 2023-04-08 – 2023-04-09 (×2): 6 via TOPICAL

## 2023-04-08 NOTE — Progress Notes (Signed)
PROGRESS NOTE Sean Rose  IHK:[id number] DOB: 1956-06-16 DOA: 04/07/2023 PCP: [name], NP  Brief Narrative/Hospital Course: 67 y.o. male with medical history significant of seasonal allergies, unspecified anemia, CAD, angina, history of MI, heart murmur, hypertension, peripheral arterial disease, tobacco abuse, emphysema, GERD, history of community-acquired pneumonia who presented to the emergency department complaints of pleuritic chest pain and mild dyspnea  x 1 day In the ED afebrile BP stable not hypoxic, labs with leukocytosis COVID influenza RSV negative troponin negative x 2 BNP 218, sodium 126. CTA chest>> No PE, Has new groundglass opacity in the anterior left base suspicious for developing infection. Scattered areas of chronic parenchymal disease most suggestive of scaring. Relatively diffuse wall thickening in the stomach similar to prior. Correlation to gastritis symptoms recommended. Aortic atherosclerosis. Emphysema.  Placed on IV antibiotics and admitted for further management    Subjective: Patient seen examined this morning wife at the bedside earlier was endorsing significant anxiety, received Xanax states he has not felt so well for a long time and feels much calm and better is very thankful today Overnight afebrile heart rate is stable on 2 L nasal cannula BP stable Labs showed sodium slightly up 128  Assessment and Plan: Principal Problem:   Pneumonia Active Problems:   Hyponatremia   Adenocarcinoma of right lung, stage 4 (HCC)   Essential hypertension   Chronic obstructive pulmonary disease (HCC)   PVD (peripheral vascular disease) (HCC)   Coronary atherosclerosis of native coronary artery   GERD (gastroesophageal reflux disease)   Hyperlipidemia   Hypocalcemia   Iron deficiency anemia   Pneumonia anterior left base COPD/emphysema: Overall feels better, on nasal cannula will wean off continue empiric ceftriaxone azithromycin. Strep pneumonia negative .   Monitor temperature curve, labs continue gentle IV fluid hydration  Adenocarcinoma right lung stage IV: Followed by Dr. Arbutus Ped, treated for right upper lobe primary and lymph node dissection and adjuvant chemo and palliative radiation to the metastatic bone disease and systemic chemo.  Patient had disease recurrence in 2022 initially diagnosed in 2020.  PET scan showed metastatic to left scapula and left supraclavicular lymph nodes in 09/17/2020-currently on observation since December 2022 after systemic chemo and Keytruda.  Being followed with CT scan of the chest abdomen pelvis every 6 months.  Hyponatremia: ?  SIADH in the setting of lung ca/pneumonia -monitor labs, dose of chronically low sodium and ranging from 1 25-1 28.Monitor  Hypertension: BP stable on amlodipine and metoprolol  PVD CAD: Followed by vascular surgery as outpatient, continue on aspirin Plavix beta-blocker not on a statin.  GERD: Continue PPI  Hypocalcemia: Monitor  IDA: Hemoglobin is stable  Severe malnutrition: Reports his weight has stabilized in 90 to 100 pound for several months now, originally  in 140s  Anxiety: In the setting of patient's lung cancer hospitalization acute illness.  Will try Xanax as needed-he feels much improved   DVT prophylaxis: SCDs Start: 04/07/23 0824 Code Status:   Code Status: Do not attempt resuscitation (DNR) PRE-ARREST INTERVENTIONS DESIRED Family Communication: plan of care discussed with patient/wife at bedside. Patient status is:  admitted as observation but remains hospitalized for ongoing  because of pneumonia Level of care: Telemetry   Dispo: The patient is from: Home            Anticipated disposition: Anticipating discharge home tomorrow.  Eager to leave home soon as he feels better at home  Objective: Vitals last 24 hrs: Vitals:   04/07/23 2250 04/08/23 0429 04/08/23 0843 04/08/23  0846  BP: 119/84 (!) 149/89    Pulse: 73 88    Resp: 15 16    Temp: 98.2 F  (36.8 C) 97.9 F (36.6 C)    TempSrc: Oral     SpO2: 100% 100% 98% 98%   Weight change:   Physical Examination: General exam: alert awake, appears thin and frail but in good spirit  HEENT:Oral mucosa moist, Ear/Nose WNL grossly Respiratory system: bilaterally clearBS, no use of accessory muscle Cardiovascular system: S1 & S2 +, No JVD. Gastrointestinal system: Abdomen soft,NT,ND, BS+ Nervous System:Alert, awake, moving extremities. Extremities: LE edema neg,distal peripheral pulses palpable.  Skin: No rashes,no icterus. MSK: Normal muscle bulk,tone, power  Medications reviewed:  Scheduled Meds:  amLODipine  5 mg Oral Daily   aspirin EC  81 mg Oral Daily   Chlorhexidine Gluconate Cloth  6 each Topical Daily   clopidogrel  75 mg Oral Daily   fluticasone furoate-vilanterol  1 puff Inhalation Daily   And   umeclidinium bromide  1 puff Inhalation Daily   magnesium oxide  400 mg Oral QHS   metoprolol succinate  25 mg Oral Daily   pantoprazole  40 mg Oral Daily   sodium chloride flush  10-40 mL Intracatheter Q12H   Continuous Infusions:  sodium chloride 60 mL/hr at 04/07/23 2150   azithromycin 500 mg (04/08/23 0914)   cefTRIAXone (ROCEPHIN)  IV 1 g (04/08/23 9562)      Diet Order             Diet Heart Room service appropriate? Yes; Fluid consistency: Thin; Fluid restriction: 1200 mL Fluid  Diet effective now                  Intake/Output Summary (Last 24 hours) at 04/08/2023 1116 Last data filed at 04/08/2023 0606 Gross per 24 hour  Intake 722.84 ml  Output 500 ml  Net 222.84 ml   Net IO Since Admission: 227.62 mL [04/08/23 1116]  Wt Readings from Last 3 Encounters:  02/07/23 40.8 kg  01/03/23 38.4 kg  12/31/22 36.3 kg     Unresulted Labs (From admission, onward)     Start     Ordered   04/08/23 0500  HIV Antibody (routine testing w rflx)  (HIV Antibody (Routine testing w reflex) panel)  Tomorrow morning,   R        04/07/23 0823   04/08/23 0500  CBC   Tomorrow morning,   R        04/07/23 0823   04/08/23 0500  Comprehensive metabolic panel  Tomorrow morning,   R        04/07/23 0823   04/07/23 1555  Expectorated Sputum Assessment w Gram Stain, Rflx to Resp Cult  Once,   R        04/07/23 1555          Data Reviewed: I have personally reviewed following labs and imaging studies CBC: Recent Labs  Lab 04/07/23 0402 04/07/23 0618  WBC 12.6*  --   NEUTROABS 10.1*  --   HGB 10.4* 10.5*  HCT 31.6* 31.0*  MCV 95.8  --   PLT 524*  --    Basic Metabolic Panel: Recent Labs  Lab 04/07/23 0402 04/07/23 0618  NA 126* 128*  K 3.6 3.9  CL 91* 90*  CO2 24  --   GLUCOSE 112* 108*  BUN 20 19  CREATININE 1.10 1.20  CALCIUM 8.6*  --    Recent Results (from the past 240  hour(s))  Resp panel by RT-PCR (RSV, Flu A&B, Covid) Anterior Nasal Swab     Status: None   Collection Time: 04/07/23  4:44 AM   Specimen: Anterior Nasal Swab  Result Value Ref Range Status   SARS Coronavirus 2 by RT PCR NEGATIVE NEGATIVE Final    Comment: (NOTE) SARS-CoV-2 target nucleic acids are NOT DETECTED.  The SARS-CoV-2 RNA is generally detectable in upper respiratory specimens during the acute phase of infection. The lowest concentration of SARS-CoV-2 viral copies this assay can detect is 138 copies/mL. A negative result does not preclude SARS-Cov-2 infection and should not be used as the sole basis for treatment or other patient management decisions. A negative result may occur with  improper specimen collection/handling, submission of specimen other than nasopharyngeal swab, presence of viral mutation(s) within the areas targeted by this assay, and inadequate number of viral copies(<138 copies/mL). A negative result must be combined with clinical observations, patient history, and epidemiological information. The expected result is Negative.  Fact Sheet for Patients:  BloggerCourse.com  Fact Sheet for Healthcare Providers:   SeriousBroker.it  This test is no t yet approved or cleared by the [place] FDA and  has been authorized for detection and/or diagnosis of SARS-CoV-2 by FDA under an Emergency Use Authorization (EUA). This EUA will remain  in effect (meaning this test can be used) for the duration of the COVID-19 declaration under Section 564(b)(1) of the Act, 21 U.S.C.section 360bbb-3(b)(1), unless the authorization is terminated  or revoked sooner.       Influenza A by PCR NEGATIVE NEGATIVE Final   Influenza B by PCR NEGATIVE NEGATIVE Final    Comment: (NOTE) The Xpert Xpress SARS-CoV-2/FLU/RSV plus assay is intended as an aid in the diagnosis of influenza from Nasopharyngeal swab specimens and should not be used as a sole basis for treatment. Nasal washings and aspirates are unacceptable for Xpert Xpress SARS-CoV-2/FLU/RSV testing.  Fact Sheet for Patients: BloggerCourse.com  Fact Sheet for Healthcare Providers: SeriousBroker.it  This test is not yet approved or cleared by the [place] FDA and has been authorized for detection and/or diagnosis of SARS-CoV-2 by FDA under an Emergency Use Authorization (EUA). This EUA will remain in effect (meaning this test can be used) for the duration of the COVID-19 declaration under Section 564(b)(1) of the Act, 21 U.S.C. section 360bbb-3(b)(1), unless the authorization is terminated or revoked.     Resp Syncytial Virus by PCR NEGATIVE NEGATIVE Final    Comment: (NOTE) Fact Sheet for Patients: BloggerCourse.com  Fact Sheet for Healthcare Providers: SeriousBroker.it  This test is not yet approved or cleared by the [place] FDA and has been authorized for detection and/or diagnosis of SARS-CoV-2 by FDA under an Emergency Use Authorization (EUA). This EUA will remain in effect (meaning this test can be used) for  the duration of the COVID-19 declaration under Section 564(b)(1) of the Act, 21 U.S.C. section 360bbb-3(b)(1), unless the authorization is terminated or revoked.  Performed at [facility], [address], [place], [place] [address]     Antimicrobials: Anti-infectives (From admission, onward)    Start     Dose/Rate Route Frequency Ordered Stop   04/08/23 0800  azithromycin (ZITHROMAX) 500 mg in sodium chloride 0.9 % 250 mL IVPB        500 mg 250 mL/hr over 60 Minutes Intravenous Every 24 hours 04/07/23 1555 04/12/23 0759   04/08/23 0600  cefTRIAXone (ROCEPHIN) 1 g in sodium chloride 0.9 % 100 mL IVPB  1 g 200 mL/hr over 30 Minutes Intravenous Every 24 hours 04/07/23 1555 04/12/23 0559   04/07/23 0630  cefTRIAXone (ROCEPHIN) 1 g in sodium chloride 0.9 % 100 mL IVPB        1 g 200 mL/hr over 30 Minutes Intravenous  Once 04/07/23 0620 04/07/23 0703   04/07/23 0630  azithromycin (ZITHROMAX) 500 mg in sodium chloride 0.9 % 250 mL IVPB        500 mg 250 mL/hr over 60 Minutes Intravenous  Once 04/07/23 0620 04/07/23 0805      Culture/Microbiology    Component Value Date/Time   SDES  12/06/2022 1121    URINE, CLEAN CATCH Performed at [facility], [address] [place], [place] [address]    SPECREQUEST  12/06/2022 1121    NONE Performed at [facility], [address] [place], [place] [address]    CULT  12/06/2022 1121    NO GROWTH Performed at [facility], [address] [place], [place] [address]    REPTSTATUS 12/07/2022 FINAL 12/06/2022 1121    Radiology Studies: CT Angio Chest PE W and/or Wo Contrast  Result Date: 04/07/2023 CLINICAL DATA:  Personal history of lung cancer. Shortness of breath with pain under the left chest/rib cage. EXAM: CT ANGIOGRAPHY CHEST WITH CONTRAST TECHNIQUE: Multidetector CT imaging of the chest was performed using the standard protocol during bolus administration of  intravenous contrast. Multiplanar CT image reconstructions and MIPs were obtained to evaluate the vascular anatomy. RADIATION DOSE REDUCTION: This exam was performed according to the departmental dose-optimization program which includes automated exposure control, adjustment of the mA and/or kV according to patient size and/or use of iterative reconstruction technique. CONTRAST:  75mL OMNIPAQUE IOHEXOL 350 MG/ML SOLN COMPARISON:  CT chest 02/17/2023 FINDINGS: Cardiovascular: Heart size upper normal. No pericardial effusion. Coronary artery calcification is evident. Moderate atherosclerotic calcification is noted in the wall of the thoracic aorta. No dissection of the thoracic aorta. There is no filling defect within the opacified pulmonary arteries to suggest the presence of an acute pulmonary embolus. Mediastinum/Nodes: Nodular soft tissue posterior to the trachea on 49/5 is presumably related to the proximal esophagus. No definite mediastinal lymphadenopathy. There is no hilar lymphadenopathy. There is no axillary lymphadenopathy. Lungs/Pleura: Advanced changes of emphysema noted bilaterally with bullous disease in the upper lungs, right greater than left. Postsurgical changes again noted right hemithorax. The bandlike opacity noted lateral left lung apex is stable, likely scarring. Architectural distortion is noted in both lungs with similar chronic atelectasis/scarring in the dependent lower lungs bilaterally. There is some new ground-glass opacity in the anterior left base (see image 130/13) with similar chronic fluid in the right costophrenic sulcus. High density along the dependent inferior left pleural may reflect sequelae of prior pleurodesis. Upper Abdomen: Relatively diffuse wall thickening noted in the stomach, similar to prior. Musculoskeletal: No worrisome lytic or sclerotic osseous abnormality. Stable mild compression deformity at T7. Review of the MIP images confirms the above findings. IMPRESSION:  1. No CT evidence for acute pulmonary embolus. 2. New ground-glass opacity in the anterior left base. Imaging features suspicious for developing infection. 3. Scattered areas of chronic parenchymal disease most suggestive of scarring. 4. Relatively diffuse wall thickening in the stomach, similar to prior. Correlation for symptoms of gastritis recommended. 5. Aortic Atherosclerosis (ICD10-I70.0) and Emphysema (ICD10-J43.9). Electronically Signed   By: Kennith Center M.D.   On: 04/07/2023 05:51     LOS: 1 day   Lanae Boast, MD  [facility]  04/08/2023, 11:16 AM

## 2023-04-08 NOTE — Plan of Care (Signed)
  Problem: Education: Goal: Knowledge of General Education information will improve Description: Including pain rating scale, medication(s)/side effects and non-pharmacologic comfort measures Outcome: Progressing   Problem: Clinical Measurements: Goal: Will remain free from infection Outcome: Progressing Goal: Diagnostic test results will improve Outcome: Progressing   Problem: Pain Managment: Goal: General experience of comfort will improve Outcome: Progressing   Problem: Safety: Goal: Ability to remain free from injury will improve Outcome: Progressing   Problem: Skin Integrity: Goal: Risk for impaired skin integrity will decrease Outcome: Progressing

## 2023-04-08 NOTE — Hospital Course (Addendum)
HPI: 67 y.o. male with medical history significant of seasonal allergies, unspecified anemia, CAD, angina, history of MI, heart murmur, hypertension, peripheral arterial disease, tobacco abuse, emphysema, GERD, history of community-acquired pneumonia who presented to the emergency department complaints of pleuritic chest pain and mild dyspnea  x 1 day In the ED afebrile BP stable not hypoxic, labs with leukocytosis COVID influenza RSV negative troponin negative x 2 BNP 218, sodium 126. CTA chest>> No PE, Has new groundglass opacity in the anterior left base suspicious for developing infection. Scattered areas of chronic parenchymal disease most suggestive of scaring. Relatively diffuse wall thickening in the stomach similar to prior. Correlation to gastritis symptoms recommended. Aortic atherosclerosis. Emphysema.  Placed on IV antibiotics and admitted for further management   Significant Events: Admitted 04/07/2023 Xanax helped pt with anxiety  Significant Labs: Strep pneumo antigen negative Covid negative HIV negative  Significant Imaging Studies: CTPA negative for PE. Anterior left base ground-glass opacity  Antibiotic Therapy: Rocephin started 04-06-2022 Zithromax started 04-06-2022  Procedures:   Consultants:   Hospital Course * Pneumonia Pt admitted on 04-07-2023. Started on IV rocephin/zithromax. Weaned to RA quickly. CTPA negative for PE. Showed small left anterior base opacity. Difficult to determine if he has pneumonia vs scar vs cancer. Regardless, pt has improved and wants to go home. He is supported by his wife who also agrees that pt will do better at home. Pt in on RA. Afebrile. Stable for discharge to home. Prn xanax for anxiety.  Chronic hyponatremia - Baseline 125-130. Chronic. Baseline 125-130.  Iron deficiency anemia Baseline HgB 9-10 g/dl  Adenocarcinoma of right lung, stage 4 (HCC) Pt with known stage 4 lung cancer. No longer on chemo. Watchful waiting  strategy.  Chronic obstructive pulmonary disease (HCC) Stable.  Hypocalcemia Stable.  Hyperlipidemia Stable.  Essential hypertension Stable.  GERD (gastroesophageal reflux disease) Remains on bid protonix at home.  Coronary atherosclerosis of native coronary artery Stable.  PVD (peripheral vascular disease) (HCC) Stable.

## 2023-04-08 NOTE — TOC Progression Note (Signed)
Transition of Care [facility]) - Progression Note    Patient Details  Name: Sean Rose MRN: [id number] Date of Birth: 1956-01-18  Transition of Care [facility]) CM/SW Contact  Geni Bers, RN Phone Number: 04/08/2023, 10:15 AM  Clinical Narrative:     Spoke with pt's wife concerning discharge needs. Mrs. Brackin was not sure at this point. Pt was not on O2 at home. Pt is able to purchase medications. TOC will continue to follow.    Expected Discharge Plan: Home/Self Care Barriers to Discharge: No Barriers Identified  Expected Discharge Plan and Services       Living arrangements for the past 2 months: Single Family Home                                       Social Determinants of Health (SDOH) Interventions SDOH Screenings   Food Insecurity: No Food Insecurity (12/10/2022)  Housing: Low Risk  (12/13/2022)  Transportation Needs: No Transportation Needs (12/10/2022)  Utilities: Not At Risk (12/13/2022)  Depression (PHQ2-9): Medium Risk (11/20/2022)  Tobacco Use: Medium Risk (04/02/2023)    Readmission Risk Interventions    12/06/2022    1:28 PM 06/10/2022   11:24 AM 06/10/2022   11:23 AM  Readmission Risk Prevention Plan  Transportation Screening   Complete  PCP or Specialist Appt within 5-7 Days  Complete   PCP or Specialist Appt within 3-5 Days Complete    Home Care Screening   Complete  Medication Review (RN CM)   Complete  HRI or Home Care Consult Complete    Social Work Consult for Recovery Care Planning/Counseling Complete    Palliative Care Screening Not Applicable    Medication Review Oceanographer) Complete

## 2023-04-08 NOTE — Progress Notes (Signed)
Attempted Echo; patient refused stating he feels it is not necessary.  Dondra Prader RVT RCS

## 2023-04-08 NOTE — Telephone Encounter (Signed)
FYI

## 2023-04-08 NOTE — Plan of Care (Signed)

## 2023-04-09 ENCOUNTER — Encounter (HOSPITAL_COMMUNITY): Payer: Self-pay | Admitting: Internal Medicine

## 2023-04-09 DIAGNOSIS — C3491 Malignant neoplasm of unspecified part of right bronchus or lung: Secondary | ICD-10-CM

## 2023-04-09 DIAGNOSIS — J449 Chronic obstructive pulmonary disease, unspecified: Secondary | ICD-10-CM

## 2023-04-09 DIAGNOSIS — I739 Peripheral vascular disease, unspecified: Secondary | ICD-10-CM

## 2023-04-09 DIAGNOSIS — I10 Essential (primary) hypertension: Secondary | ICD-10-CM

## 2023-04-09 DIAGNOSIS — E871 Hypo-osmolality and hyponatremia: Secondary | ICD-10-CM

## 2023-04-09 DIAGNOSIS — J189 Pneumonia, unspecified organism: Secondary | ICD-10-CM | POA: Diagnosis not present

## 2023-04-09 MED ORDER — ALPRAZOLAM 0.25 MG PO TABS: 0.2500 mg | ORAL_TABLET | Freq: Two times a day (BID) | ORAL | 0 refills | Status: DC | PRN

## 2023-04-09 MED ORDER — AZITHROMYCIN 500 MG PO TABS: 500.0000 mg | ORAL_TABLET | Freq: Every day | ORAL | 0 refills | Status: DC

## 2023-04-09 MED ORDER — CEFDINIR 300 MG PO CAPS
300.0000 mg | ORAL_CAPSULE | Freq: Two times a day (BID) | ORAL | 0 refills | Status: AC
Start: 2023-04-09 — End: 2023-04-14

## 2023-04-09 MED ORDER — AZITHROMYCIN 250 MG PO TABS: 500.0000 mg | ORAL_TABLET | Freq: Every day | ORAL | Status: DC

## 2023-04-09 MED ORDER — HEPARIN SOD (PORK) LOCK FLUSH 100 UNIT/ML IV SOLN
500.0000 [IU] | INTRAVENOUS | Status: DC
  Administered 2023-04-09: 500 [IU]
  Filled 2023-04-09: qty 5

## 2023-04-09 NOTE — TOC Transition Note (Addendum)
Transition of Care [facility]) - CM/SW Discharge Note   Patient Details  Name: Sean Rose MRN: [id number] Date of Birth: [birth year]  Transition of Care [facility]) CM/SW Contact:  Beckie Busing, RN Phone Number:[phone]  04/09/2023, 1:19 PM   Clinical Narrative:    CM received call from MD requesting [facility] transport home. CM has verified that patient is bedbound requiring ambulance transport and address has been verified with spouse. D/c packet has been completed and transportation has been arranged via [facility]. Discharge packet has been placed in chart at nurses station.    Final next level of care: Home/Self Care Barriers to Discharge: No Barriers Identified   Patient Goals and CMS Choice      Discharge Placement                         Discharge Plan and Services Additional resources added to the After Visit Summary for                                       Social Determinants of Health (SDOH) Interventions SDOH Screenings   Food Insecurity: No Food Insecurity (12/10/2022)  Housing: Low Risk  (12/13/2022)  Transportation Needs: No Transportation Needs (12/10/2022)  Utilities: Not At Risk (12/13/2022)  Depression (PHQ2-9): Medium Risk (11/20/2022)  Tobacco Use: Medium Risk (04/02/2023)     Readmission Risk Interventions    12/06/2022    1:28 PM 06/10/2022   11:24 AM 06/10/2022   11:23 AM  Readmission Risk Prevention Plan  Transportation Screening   Complete  PCP or Specialist Appt within 5-7 Days  Complete   PCP or Specialist Appt within 3-5 Days Complete    Home Care Screening   Complete  Medication Review (RN CM)   Complete  HRI or Home Care Consult Complete    Social Work Consult for Recovery Care Planning/Counseling Complete    Palliative Care Screening Not Applicable    Medication Review Oceanographer) Complete

## 2023-04-09 NOTE — Assessment & Plan Note (Signed)
Remains on bid protonix at home.

## 2023-04-09 NOTE — Consult Note (Addendum)
   Value-Based Care Institute  [facility] Inpatient Consult   04/09/2023  [name] Mar 21, 1956 [id number]  [facility] Care Institute [facility] [[facility]]  Accountable Care Organization [ACO] Patient: Sean Rose [facility] Medicare ACO REACH  [facility] Liaison remote coverage review for patient admitted to [facility]    Primary Care Provider:  [name], NP with [place] [place] is listed to provide the transition of care follow up  Patient is currently active with [facility] [[facility]] Care Management for care coordination services.  Patient has been engaged by a [employer].  The community based plan of care has focused on disease management and community resource support.  Patient was reviewed for hospitalization with noted extreme high risk scores. Patient is to transport home today with [facility] as he is noted to be bedbound per inpatient TOC notes.   Plan: Post hospital follow up with [facility]. Patient noted with upcoming appointment with Community RN CC.  Of note, [facility] Care Management services does not replace or interfere with any services that are needed or arranged by inpatient [facility] care management team.   Charlesetta Shanks, RN, BSN, CCM [place]  [facility] Health [facility] Liaison Direct Dial: [phone] or secure chat Website: [name]

## 2023-04-09 NOTE — Assessment & Plan Note (Signed)
Stable

## 2023-04-09 NOTE — Progress Notes (Signed)
Pt discharged home by [facility] via ambulance. Admission discharge packet compiled and given to [facility] on arrival. Pts tele taken off and central tele called to discontinue. Pts chest port de-accessed during day shift. Pt changed into regular clothes and belongings gathered and brought home by family.

## 2023-04-09 NOTE — Discharge Summary (Signed)
Physician Discharge Summary   Patient name: Sean Rose  Admit date:     04/07/2023  Discharge date: 04/09/2023  Attending Physician: Bobette Mo [[id number]]  Discharge Physician: Carollee Herter   PCP: [name], NP   Recommendations at discharge: finish today of 7 days of abx(omnicef and zithromax)  Discharge Diagnoses Principal Problem:   Pneumonia Active Problems:   Chronic hyponatremia - Baseline 125-130.   PVD (peripheral vascular disease) (HCC)   Coronary atherosclerosis of native coronary artery   GERD (gastroesophageal reflux disease)   Essential hypertension   Hyperlipidemia   Hypocalcemia   Chronic obstructive pulmonary disease (HCC)   Adenocarcinoma of right lung, stage 4 (HCC)   Iron deficiency anemia   Resolved Diagnoses Resolved Problems:   * No resolved hospital problems. *   HPI: 67 y.o. male with medical history significant of seasonal allergies, unspecified anemia, CAD, angina, history of MI, heart murmur, hypertension, peripheral arterial disease, tobacco abuse, emphysema, GERD, history of community-acquired pneumonia who presented to the emergency department complaints of pleuritic chest pain and mild dyspnea  x 1 day In the ED afebrile BP stable not hypoxic, labs with leukocytosis COVID influenza RSV negative troponin negative x 2 BNP 218, sodium 126. CTA chest>> No PE, Has new groundglass opacity in the anterior left base suspicious for developing infection. Scattered areas of chronic parenchymal disease most suggestive of scaring. Relatively diffuse wall thickening in the stomach similar to prior. Correlation to gastritis symptoms recommended. Aortic atherosclerosis. Emphysema.  Placed on IV antibiotics and admitted for further management   Significant Events: Admitted 04/07/2023 Xanax helped pt with anxiety  Significant Labs: Strep pneumo antigen negative Covid negative HIV negative  Significant Imaging Studies: CTPA negative for PE.  Anterior left base ground-glass opacity  Antibiotic Therapy: Rocephin started 04-06-2022 Zithromax started 04-06-2022  Procedures:   Consultants:   Hospital Course * Pneumonia Pt admitted on 04-07-2023. Started on IV rocephin/zithromax. Weaned to RA quickly. CTPA negative for PE. Showed small left anterior base opacity. Difficult to determine if he has pneumonia vs scar vs cancer. Regardless, pt has improved and wants to go home. He is supported by his wife who also agrees that pt will do better at home. Pt in on RA. Afebrile. Stable for discharge to home. Prn xanax for anxiety.  Chronic hyponatremia - Baseline 125-130. Chronic. Baseline 125-130.  Iron deficiency anemia Baseline HgB 9-10 g/dl  Adenocarcinoma of right lung, stage 4 (HCC) Pt with known stage 4 lung cancer. No longer on chemo. Watchful waiting strategy.  Chronic obstructive pulmonary disease (HCC) Stable.  Hypocalcemia Stable.  Hyperlipidemia Stable.  Essential hypertension Stable.  GERD (gastroesophageal reflux disease) Remains on bid protonix at home.  Coronary atherosclerosis of native coronary artery Stable.  PVD (peripheral vascular disease) (HCC) Stable.     Condition at discharge: stable  Exam Physical Exam Vitals and nursing note reviewed.  Constitutional:      Comments: Chronically ill appearing, frail male. No distress  HENT:     Head: Normocephalic and atraumatic.     Nose: Nose normal.  Cardiovascular:     Rate and Rhythm: Normal rate and regular rhythm.  Pulmonary:     Effort: Pulmonary effort is normal.  Abdominal:     General: Abdomen is flat.  Musculoskeletal:     Right lower leg: No edema.     Left lower leg: No edema.     Comments: Right ant chest wall port-a-cath  Skin:    General: Skin is warm  and dry.     Capillary Refill: Capillary refill takes less than 2 seconds.  Neurological:     Mental Status: He is oriented to person, place, and time.      Disposition:  Home  Discharge time: greater than 30 minutes. Allergies as of 04/09/2023       Reactions   Compazine [prochlorperazine] Other (See Comments)   " made him high and could not sleep". He does not want to take it again.   Cyclobenzaprine Other (See Comments)   Hallucination    Gabapentin         Medication List     STOP taking these medications    mirtazapine 15 MG tablet Commonly known as: REMERON   rosuvastatin 5 MG tablet Commonly known as: CRESTOR   sodium chloride 1 g tablet       TAKE these medications    acetaminophen 500 MG tablet Commonly known as: TYLENOL Take 1,000 mg by mouth as needed for moderate pain or mild pain.   ALPRAZolam 0.25 MG tablet Commonly known as: XANAX Take 1 tablet (0.25 mg total) by mouth 2 (two) times daily as needed for anxiety.   amLODipine 5 MG tablet Commonly known as: NORVASC Take 1 tablet (5 mg total) by mouth daily. Keep appt on 04/08/23   aspirin EC 81 MG tablet Take 81 mg by mouth daily.   azithromycin 500 MG tablet Commonly known as: ZITHROMAX Take 1 tablet (500 mg total) by mouth daily. Start taking on: April 10, 2023   cefdinir 300 MG capsule Commonly known as: OMNICEF Take 1 capsule (300 mg total) by mouth 2 (two) times daily for 5 days.   cetirizine 10 MG tablet Commonly known as: ZYRTEC Take 10 mg by mouth at bedtime.   clopidogrel 75 MG tablet Commonly known as: PLAVIX TAKE 1 TABLET BY MOUTH EVERY DAY   esomeprazole 20 MG capsule Commonly known as: NEXIUM Take 40 mg by mouth daily.   Magnesium 500 MG Tabs Take 1 tablet (500 mg total) by mouth in the morning and at bedtime.   metoprolol succinate 25 MG 24 hr tablet Commonly known as: TOPROL-XL Take 1 tablet (25 mg total) by mouth daily. Please call office to schedule an appt for further refills. Thank you   multivitamin with minerals Tabs tablet Take 1 tablet by mouth daily.   ondansetron 4 MG tablet Commonly known as: ZOFRAN TAKE 1  TABLET(4 MG) BY MOUTH EVERY 8 HOURS AS NEEDED FOR NAUSEA OR VOMITING   oxyCODONE 15 MG immediate release tablet Commonly known as: Roxicodone Take 1 tablet (15 mg total) by mouth every 4 (four) hours as needed for pain.   Trelegy Ellipta 100-62.5-25 MCG/ACT Aepb Generic drug: Fluticasone-Umeclidin-Vilant Inhale 1 puff into the lungs daily.        CT Angio Chest PE W and/or Wo Contrast  Result Date: 04/07/2023 CLINICAL DATA:  Personal history of lung cancer. Shortness of breath with pain under the left chest/rib cage. EXAM: CT ANGIOGRAPHY CHEST WITH CONTRAST TECHNIQUE: Multidetector CT imaging of the chest was performed using the standard protocol during bolus administration of intravenous contrast. Multiplanar CT image reconstructions and MIPs were obtained to evaluate the vascular anatomy. RADIATION DOSE REDUCTION: This exam was performed according to the departmental dose-optimization program which includes automated exposure control, adjustment of the mA and/or kV according to patient size and/or use of iterative reconstruction technique. CONTRAST:  75mL OMNIPAQUE IOHEXOL 350 MG/ML SOLN COMPARISON:  CT chest 02/17/2023 FINDINGS: Cardiovascular: Heart size  upper normal. No pericardial effusion. Coronary artery calcification is evident. Moderate atherosclerotic calcification is noted in the wall of the thoracic aorta. No dissection of the thoracic aorta. There is no filling defect within the opacified pulmonary arteries to suggest the presence of an acute pulmonary embolus. Mediastinum/Nodes: Nodular soft tissue posterior to the trachea on 49/5 is presumably related to the proximal esophagus. No definite mediastinal lymphadenopathy. There is no hilar lymphadenopathy. There is no axillary lymphadenopathy. Lungs/Pleura: Advanced changes of emphysema noted bilaterally with bullous disease in the upper lungs, right greater than left. Postsurgical changes again noted right hemithorax. The bandlike opacity  noted lateral left lung apex is stable, likely scarring. Architectural distortion is noted in both lungs with similar chronic atelectasis/scarring in the dependent lower lungs bilaterally. There is some new ground-glass opacity in the anterior left base (see image 130/13) with similar chronic fluid in the right costophrenic sulcus. High density along the dependent inferior left pleural may reflect sequelae of prior pleurodesis. Upper Abdomen: Relatively diffuse wall thickening noted in the stomach, similar to prior. Musculoskeletal: No worrisome lytic or sclerotic osseous abnormality. Stable mild compression deformity at T7. Review of the MIP images confirms the above findings. IMPRESSION: 1. No CT evidence for acute pulmonary embolus. 2. New ground-glass opacity in the anterior left base. Imaging features suspicious for developing infection. 3. Scattered areas of chronic parenchymal disease most suggestive of scarring. 4. Relatively diffuse wall thickening in the stomach, similar to prior. Correlation for symptoms of gastritis recommended. 5. Aortic Atherosclerosis (ICD10-I70.0) and Emphysema (ICD10-J43.9). Electronically Signed   By: Kennith Center M.D.   On: 04/07/2023 05:51   Results for orders placed or performed during the hospital encounter of 04/07/23  Resp panel by RT-PCR (RSV, Flu A&B, Covid) Anterior Nasal Swab     Status: None   Collection Time: 04/07/23  4:44 AM   Specimen: Anterior Nasal Swab  Result Value Ref Range Status   SARS Coronavirus 2 by RT PCR NEGATIVE NEGATIVE Final    Comment: (NOTE) SARS-CoV-2 target nucleic acids are NOT DETECTED.  The SARS-CoV-2 RNA is generally detectable in upper respiratory specimens during the acute phase of infection. The lowest concentration of SARS-CoV-2 viral copies this assay can detect is 138 copies/mL. A negative result does not preclude SARS-Cov-2 infection and should not be used as the sole basis for treatment or other patient management  decisions. A negative result may occur with  improper specimen collection/handling, submission of specimen other than nasopharyngeal swab, presence of viral mutation(s) within the areas targeted by this assay, and inadequate number of viral copies(<138 copies/mL). A negative result must be combined with clinical observations, patient history, and epidemiological information. The expected result is Negative.  Fact Sheet for Patients:  BloggerCourse.com  Fact Sheet for Healthcare Providers:  SeriousBroker.it  This test is no t yet approved or cleared by the [place] FDA and  has been authorized for detection and/or diagnosis of SARS-CoV-2 by FDA under an Emergency Use Authorization (EUA). This EUA will remain  in effect (meaning this test can be used) for the duration of the COVID-19 declaration under Section 564(b)(1) of the Act, 21 U.S.C.section 360bbb-3(b)(1), unless the authorization is terminated  or revoked sooner.       Influenza A by PCR NEGATIVE NEGATIVE Final   Influenza B by PCR NEGATIVE NEGATIVE Final    Comment: (NOTE) The Xpert Xpress SARS-CoV-2/FLU/RSV plus assay is intended as an aid in the diagnosis of influenza from Nasopharyngeal swab specimens and should not  be used as a sole basis for treatment. Nasal washings and aspirates are unacceptable for Xpert Xpress SARS-CoV-2/FLU/RSV testing.  Fact Sheet for Patients: BloggerCourse.com  Fact Sheet for Healthcare Providers: SeriousBroker.it  This test is not yet approved or cleared by the [place] FDA and has been authorized for detection and/or diagnosis of SARS-CoV-2 by FDA under an Emergency Use Authorization (EUA). This EUA will remain in effect (meaning this test can be used) for the duration of the COVID-19 declaration under Section 564(b)(1) of the Act, 21 U.S.C. section 360bbb-3(b)(1), unless the  authorization is terminated or revoked.     Resp Syncytial Virus by PCR NEGATIVE NEGATIVE Final    Comment: (NOTE) Fact Sheet for Patients: BloggerCourse.com  Fact Sheet for Healthcare Providers: SeriousBroker.it  This test is not yet approved or cleared by the [place] FDA and has been authorized for detection and/or diagnosis of SARS-CoV-2 by FDA under an Emergency Use Authorization (EUA). This EUA will remain in effect (meaning this test can be used) for the duration of the COVID-19 declaration under Section 564(b)(1) of the Act, 21 U.S.C. section 360bbb-3(b)(1), unless the authorization is terminated or revoked.  Performed at [facility], [address], [place], [place] [address]    CBC: Recent Labs  Lab 04/07/23 0402 04/07/23 0618 04/08/23 1535  WBC 12.6*  --  8.8  NEUTROABS 10.1*  --   --   HGB 10.4* 10.5* 9.8*  HCT 31.6* 31.0* 30.1*  MCV 95.8  --  98.4  PLT 524*  --  459*   Basic Metabolic Panel: Recent Labs  Lab 04/07/23 0402 04/07/23 0618 04/08/23 1535  NA 126* 128* 129*  K 3.6 3.9 3.4*  CL 91* 90* 92*  CO2 24  --  26  GLUCOSE 112* 108* 67*  BUN 20 19 16   CREATININE 1.10 1.20 1.06  CALCIUM 8.6*  --  8.6*   GFR: CrCl cannot be calculated (Unknown ideal weight.). Liver Function Tests: Recent Labs  Lab 04/08/23 1535  AST 17  ALT 11  ALKPHOS 86  BILITOT 0.6  PROT 6.7  ALBUMIN 2.7*   Signed: Carollee Herter DO.  [facility] 04/09/2023, 12:32 PM

## 2023-04-09 NOTE — Plan of Care (Signed)
°  Problem: Clinical Measurements: Goal: Will remain free from infection Outcome: Progressing   Problem: Safety: Goal: Ability to remain free from injury will improve Outcome: Progressing   Problem: Pain Managment: Goal: General experience of comfort will improve Outcome: Progressing

## 2023-04-09 NOTE — Assessment & Plan Note (Signed)
Pt admitted on 04-07-2023. Started on IV rocephin/zithromax. Weaned to RA quickly. CTPA negative for PE. Showed small left anterior base opacity. Difficult to determine if he has pneumonia vs scar vs cancer. Regardless, pt has improved and wants to go home. He is supported by his wife who also agrees that pt will do better at home. Pt in on RA. Afebrile. Stable for discharge to home. Prn xanax for anxiety.

## 2023-04-09 NOTE — Assessment & Plan Note (Signed)
Chronic. Baseline 125-130.

## 2023-04-09 NOTE — Assessment & Plan Note (Signed)
Baseline HgB 9-10 g/dl

## 2023-04-09 NOTE — Assessment & Plan Note (Signed)
Pt with known stage 4 lung cancer. No longer on chemo. Watchful waiting strategy.

## 2023-04-10 ENCOUNTER — Other Ambulatory Visit: Payer: Self-pay | Admitting: Cardiovascular Disease

## 2023-04-10 ENCOUNTER — Telehealth: Payer: Self-pay

## 2023-04-10 NOTE — Transitions of Care (Post Inpatient/ED Visit) (Signed)
   04/10/2023  Name: Sean Rose MRN: [id number] DOB: 02-11-1956  Today's TOC FU Call Status: Today's TOC FU Call Status:: Successful TOC FU Call Completed TOC FU Call Complete Date: 04/10/23 Patient's Name and Date of Birth confirmed.  Transition Care Management Follow-up Telephone Call Date of Discharge: 04/09/23 Discharge Facility: [facility]) Type of Discharge: Inpatient Admission Primary Inpatient Discharge Diagnosis:: "primary malignant neoplasm of lung metastatic to other unspecified laterality" How have you been since you were released from the hospital?:  (Pt voices he is doing okay since coming home. He did not wish to discuss his medical status at length and complete all of assessment. Call ended early per patient request.)  Items Reviewed: Did you receive and understand the discharge instructions provided?: Yes Medications obtained,verified, and reconciled?: No Medications Not Reviewed Reasons:: Other: (pt declined) Any new allergies since your discharge?: No Dietary orders reviewed?: NA Do you have support at home?: Yes People in Home: spouse Name of Support/Comfort Primary Source: [name]  Medications Reviewed Today: Medications Reviewed Today   Medications were not reviewed in this encounter     Home Care and Equipment/Supplies: Were Home Health Services Ordered?: NA Any new equipment or medical supplies ordered?: NA  Functional Questionnaire: Do you need assistance with bathing/showering or dressing?:  (unable to assess-pt requested to end call)  Follow up appointments reviewed: PCP Follow-up appointment confirmed?: No MD Provider Line Number:[phone] Given: No Specialist Hospital Follow-up appointment confirmed?: No (Pt voiced he is considering changing  his MDs to another healthcare system) Do you need transportation to your follow-up appointment?: No Do you understand care options if your condition(s) worsen?: Yes-patient verbalized  understanding  SDOH Interventions Today    Flowsheet Row Most Recent Value  SDOH Interventions   Food Insecurity Interventions Intervention Not Indicated  Transportation Interventions Intervention Not Indicated      TOC Interventions Today    Flowsheet Row Most Recent Value  TOC Interventions   TOC Interventions Discussed/Reviewed TOC Interventions Discussed      Interventions Today    Flowsheet Row Most Recent Value  General Interventions   General Interventions Discussed/Reviewed General Interventions Discussed, Doctor Visits  Doctor Visits Discussed/Reviewed Doctor Visits Discussed, PCP, Specialist  PCP/Specialist Visits Compliance with follow-up visit       Alessandra Grout [facility] Care Management Care Management Community Coordinator Direct Phone: [phone] Toll Free: [phone] Fax: [phone]

## 2023-04-11 ENCOUNTER — Other Ambulatory Visit: Payer: Self-pay | Admitting: Cardiovascular Disease

## 2023-04-11 ENCOUNTER — Other Ambulatory Visit: Payer: Self-pay | Admitting: Adult Health

## 2023-04-11 MED ORDER — AMITRIPTYLINE HCL 25 MG PO TABS: 25.0000 mg | ORAL_TABLET | Freq: Every day | ORAL | 0 refills | Status: DC

## 2023-04-18 ENCOUNTER — Telehealth: Payer: BC Managed Care – PPO | Admitting: Adult Health

## 2023-04-22 ENCOUNTER — Other Ambulatory Visit: Payer: Self-pay | Admitting: Cardiovascular Disease

## 2023-04-22 ENCOUNTER — Encounter: Payer: Self-pay | Admitting: Adult Health

## 2023-04-22 ENCOUNTER — Telehealth (INDEPENDENT_AMBULATORY_CARE_PROVIDER_SITE_OTHER): Payer: BC Managed Care – PPO | Admitting: Adult Health

## 2023-04-22 VITALS — Ht 67.0 in | Wt 85.0 lb

## 2023-04-22 DIAGNOSIS — R200 Anesthesia of skin: Secondary | ICD-10-CM | POA: Diagnosis not present

## 2023-04-22 DIAGNOSIS — F419 Anxiety disorder, unspecified: Secondary | ICD-10-CM

## 2023-04-22 DIAGNOSIS — M25511 Pain in right shoulder: Secondary | ICD-10-CM

## 2023-04-22 DIAGNOSIS — C3491 Malignant neoplasm of unspecified part of right bronchus or lung: Secondary | ICD-10-CM | POA: Diagnosis not present

## 2023-04-22 DIAGNOSIS — R040 Epistaxis: Secondary | ICD-10-CM

## 2023-04-22 DIAGNOSIS — G894 Chronic pain syndrome: Secondary | ICD-10-CM | POA: Diagnosis not present

## 2023-04-22 DIAGNOSIS — R202 Paresthesia of skin: Secondary | ICD-10-CM

## 2023-04-22 DIAGNOSIS — J189 Pneumonia, unspecified organism: Secondary | ICD-10-CM

## 2023-04-22 MED ORDER — ALPRAZOLAM 0.25 MG PO TABS
0.2500 mg | ORAL_TABLET | Freq: Two times a day (BID) | ORAL | 2 refills | Status: DC | PRN
Start: 2023-04-22 — End: 2023-06-20

## 2023-04-22 MED ORDER — AZITHROMYCIN 250 MG PO TABS
ORAL_TABLET | ORAL | 0 refills | Status: AC
Start: 2023-04-22 — End: 2023-04-27

## 2023-04-22 NOTE — Progress Notes (Signed)
Virtual Visit via Video Note  I connected with Sean Rose  on 04/22/23 at  3:45 PM EDT by a video enabled telemedicine application and verified that I am speaking with the correct person using two identifiers.  Location patient: home Location provider:work or home office Persons participating in the virtual visit: patient, provider  I discussed the limitations of evaluation and management by telemedicine and the availability of in person appointments. The patient expressed understanding and agreed to proceed.   HPI: Patient with recent hospital admission for suspected left lower lobe pneumonia.  He was treated Rocephin and Zithromax.  Is difficult to determine if he has pneumonia versus scar tissue versus further mets of his lung cancer.  He reports since being discharged from the hospital he is felt okay but continues to have pain in his left lower lung.  In the hospital he was placed on Xanax for anxiety and is taking 0.25 mg twice daily.  He feels as though this is helping with his anxiety but he continues to have trouble sleeping due to fear of running out of breath.  He is checking his oxygen saturations with readings between 93 and 98 % on RA.  In his left lower lung seems to be more at nighttime.  Also increase his oxycodone to 15 mg every 4 hours for chronic pain from cancer.  This seems to be working well for him and he has better pain relief than when he was on the 10 mg dose.  Additionally, he has progressive numbness and weakness in his right hand from suspected metastasis.  Before he was put in the hospital he was placed on gabapentin but this made him "loopy", after he was discharged from the hospital I sent in Elavil 25 mg nightly but he has not tried this yet.  Furthermore he reports that in the morning when he wakes up he will cough and have scant bloody mucus.  This does not happen during the daytime.  He does report frequent nosebleeds.   ROS: See pertinent positives and  negatives per HPI.  Past Medical History:  Diagnosis Date   Allergy    Anemia    Anginal pain (HCC)    Appendicitis 12/07/2018   Blood transfusion without reported diagnosis    CAD (coronary artery disease)    BMS to RCA 2007. Low risk nuclear stress 2020.   CAP (community acquired pneumonia) 09/2016   COPD (chronic obstructive pulmonary disease) (HCC)    Dyspnea    occasionally   Emphysema of lung (HCC)    GERD (gastroesophageal reflux disease)    Heart murmur    "I was told I had one when I was a kid"   Hemorrhoids    History of anal fissures    "no surgeries" (10/30/2016)   Hyperlipidemia    Hypertension    lung ca dx'd 08/2018   with mets to bones in arms   Myocardial infarction [facility])    Peripheral arterial disease (HCC)    status post right common iliac artery stenting back in 2007   Pneumothorax after biopsy 09/02/2018   Seasonal allergies    Tobacco abuse     Past Surgical History:  Procedure Laterality Date   ANKLE SURGERY Left    "rebuilt it"   ANTERIOR CRUCIATE LIGAMENT REPAIR Right    CARDIAC CATHETERIZATION  09/23/2008   Continued medical therapy - may need GI evaluation in addition.   CARDIAC CATHETERIZATION  10/28/2007   Medical therapy recommended.   CARDIAC  CATHETERIZATION  11/18/2006   In-stent restenosis RCA  (50% distal edge, 80% segmental mid, and 50-60% segmental proximal). Successful cutting balloon atherectomy using a [id number] cutting balloon. 3 inflations with atherectomy performed on mid and proximal portions resulting in reduction of 80% mid in-stent restenosis to less than 20% residual and 50-60% segmental proximal to less than 20% residual without dissection.   CARDIAC CATHETERIZATION  02/26/2006   Severe stenosis in RCA. Stenting performed using IVUS. 3.5x20 Maverick balloon deployed at [employer]. Distal stent-a 4x28 Liberte stent-deployed 12atm 48sec, 12atm 31sec, 4atm 19sec. Mid stent-a 4x28 Liberte stent-deployed 14atm 45sec, 14atm 60sec, 14atm  44sec. Proximal stent-4x8 Liberte- 14atm 45sec,14atm 47sec, 16atm 43sec. Severely diseased segment then appeared TIMI-3 flow.   CARDIOVASCULAR STRESS TEST  11/17/2012   No significant ECG changes. Septal perfusion defect is new when complared to study from 2010. Abnormal myocardial perfusion imaging with a basal to mid perfusion suggestive of previous MI.   CAROTID DOPPLER  08/09/2011   Bilateral Bulb/Proximal ICA - demonstrated a mild amount of fibrous plaque without evidence of significant diameter reduction reduction or other vascular abnormality.   CHEST TUBE INSERTION Right 09/02/2018   Procedure: Chest Tube Insertion;  Surgeon: Josephine Igo, DO;  Location: [facility] OR;  Service: Thoracic;  Laterality: Right;   COLONOSCOPY     2003, 2014   CORONARY ANGIOPLASTY WITH STENT PLACEMENT     FEMORAL ARTERY STENT     INGUINAL HERNIA REPAIR Right    IR IMAGING GUIDED PORT INSERTION  11/13/2020   KNEE ARTHROSCOPY Right "multiple"   LAPAROSCOPIC APPENDECTOMY N/A 12/07/2018   Procedure: APPENDECTOMY LAPAROSCOPIC;  Surgeon: Abigail Miyamoto, MD;  Location: [facility] ORS;  Service: General;  Laterality: N/A;   LOWER EXTREMITY ARTERIAL DOPPLER  01/31/2011   Bilateral ABIs-normal values with no suggestion of arterial insuff to the lower extremities at rest. 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 Right common iliac artery with an 8.0x18 mm Herculink stent deployed at 12 atm. Stenosis was reduced from 80% to 0% with brisk flow. b. I-cast stenting to left common iliac artery   PERIPHERAL VASCULAR CATHETERIZATION N/A 04/13/2015   Procedure: Lower Extremity Angiography;  Surgeon: Runell Gess, MD; L-oCIA 75%, 40-50% L-EIA, R-CIA stent patent, s/p 8 mm x 38 mm ICast covered stent>>0% stenosis in L-oCIA      SHOULDER ARTHROSCOPY WITH ROTATOR CUFF REPAIR Right     TRANSTHORACIC ECHOCARDIOGRAM  11/26/2012   EF not noted. Aortic valve-sclerosis without stenosis, no regurgiation.    UPPER GASTROINTESTINAL ENDOSCOPY     US CAROTID DOPPLER BILATERAL ([facility] HX)  08/09/2011   Bilateral Bulb/Proximal ICAa demonstrated a mild amount of fibrous plaque without evidence of significant diameter reduction or any other vascular abnormality.   VIDEO ASSISTED THORACOSCOPY (VATS)/ LOBECTOMY Right 09/14/2018   Procedure: RIGHT VIDEO ASSISTED THORACOSCOPY (VATS)/ RIGHT UPPER LOBECTOMY;  Surgeon: Loreli Slot, MD;  Location: [facility] OR;  Service: Thoracic;  Laterality: Right;   VIDEO BRONCHOSCOPY WITH ENDOBRONCHIAL NAVIGATION N/A 09/02/2018   Procedure: VIDEO BRONCHOSCOPY WITH ENDOBRONCHIAL NAVIGATION;  Surgeon: Josephine Igo, DO;  Location: [facility] OR;  Service: Thoracic;  Laterality: N/A;   VIDEO BRONCHOSCOPY WITH INSERTION OF INTERBRONCHIAL VALVE (IBV) N/A 07/10/2022   Procedure: VIDEO BRONCHOSCOPY WITH INSERTION OF INTERBRONCHIAL VALVE (IBV);  Surgeon: Loreli Slot, MD;  Location: [facility] OR;  Service: Thoracic;  Laterality:  N/A;   VIDEO BRONCHOSCOPY WITH INSERTION OF INTERBRONCHIAL VALVE (IBV) N/A 09/06/2022   Procedure: VIDEO BRONCHOSCOPY WITH REMOVAL OF INTRABRONCHIAL VALVE (IBV);  Surgeon: Loreli Slot, MD;  Location: [facility] OR;  Service: Thoracic;  Laterality: N/A;    Family History  Problem Relation Age of Onset   Colon cancer Mother    Heart disease Father    Heart disease Paternal Grandfather    Rectal cancer Other    Esophageal cancer Neg Hx    Stomach cancer Neg Hx        Current Outpatient Medications:    acetaminophen (TYLENOL) 500 MG tablet, Take 1,000 mg by mouth as needed for moderate pain or mild pain., Disp: , Rfl:    ALPRAZolam (XANAX) 0.25 MG tablet, Take 1 tablet (0.25 mg total) by mouth 2 (two) times daily as needed for anxiety., Disp: 30 tablet, Rfl: 0   amitriptyline (ELAVIL) 25 MG tablet, Take 1 tablet (25 mg total) by mouth at  bedtime., Disp: 90 tablet, Rfl: 0   amLODipine (NORVASC) 5 MG tablet, TAKE 1 TABLET(5 MG) BY MOUTH DAILY, Disp: 15 tablet, Rfl: 0   aspirin EC 81 MG tablet, Take 81 mg by mouth daily., Disp: , Rfl:    azithromycin (ZITHROMAX) 500 MG tablet, Take 1 tablet (500 mg total) by mouth daily., Disp: 3 tablet, Rfl: 0   cetirizine (ZYRTEC) 10 MG tablet, Take 10 mg by mouth at bedtime., Disp: , Rfl:    clopidogrel (PLAVIX) 75 MG tablet, TAKE 1 TABLET BY MOUTH EVERY DAY, Disp: 90 tablet, Rfl: 1   esomeprazole (NEXIUM) 20 MG capsule, Take 40 mg by mouth daily., Disp: , Rfl:    Fluticasone-Umeclidin-Vilant (TRELEGY ELLIPTA) 100-62.5-25 MCG/ACT AEPB, Inhale 1 puff into the lungs daily., Disp: 60 each, Rfl: 3   Magnesium 500 MG TABS, Take 1 tablet (500 mg total) by mouth in the morning and at bedtime., Disp: , Rfl:    metoprolol succinate (TOPROL-XL) 25 MG 24 hr tablet, Take 1 tablet (25 mg total) by mouth daily. Please call office to schedule an appt for further refills. Thank you, Disp: 30 tablet, Rfl: 0   Multiple Vitamin (MULTIVITAMIN WITH MINERALS) TABS tablet, Take 1 tablet by mouth daily., Disp: , Rfl:    ondansetron (ZOFRAN) 4 MG tablet, TAKE 1 TABLET(4 MG) BY MOUTH EVERY 8 HOURS AS NEEDED FOR NAUSEA OR VOMITING, Disp: 20 tablet, Rfl: 0   oxyCODONE (ROXICODONE) 15 MG immediate release tablet, Take 1 tablet (15 mg total) by mouth every 4 (four) hours as needed for pain., Disp: 90 tablet, Rfl: 0 No current facility-administered medications for this visit.  Facility-Administered Medications Ordered in Other Visits:    diphenhydrAMINE (BENADRYL) 25 mg capsule, , , ,   EXAM:  VITALS per patient if applicable:  GENERAL: alert, oriented, appears well and in no acute distress  HEENT: atraumatic, conjunttiva clear, no obvious abnormalities on inspection of external nose and ears  NECK: normal movements of the head and neck  LUNGS: on inspection no signs of respiratory distress, breathing rate appears  normal, no obvious gross SOB, gasping or wheezing  CV: no obvious cyanosis  MS: moves all visible extremities without noticeable abnormality  PSYCH/NEURO: pleasant and cooperative, no obvious depression or anxiety, speech and thought processing grossly intact  ASSESSMENT AND PLAN:  Discussed the following assessment and plan:  1. Chronic pain syndrome - Continue with Oxycodone 15 mg Q4H  - Try using elavil 25 mg at bedtime   2. Adenocarcinoma of right  lung, stage 3 (HCC) - Refuses treatment. Watchful waiting  3. Acute pain of right shoulder - Continue with Oxycocodone 15 mg Q4 H PRN   4. Numbness and tingling - Try using Elavil at night but skip evening xanax dose if he takes it  5. Epistaxis - I think this is where his bloody sputum is coming from  - Try using normal saline spray   6. Anxiety  - ALPRAZolam (XANAX) 0.25 MG tablet; Take 1 tablet (0.25 mg total) by mouth 2 (two) times daily as needed for anxiety.  Dispense: 60 tablet; Refill: 2  7. Pneumonia of left lower lobe due to infectious organism - Will treat with another round of azithromycin incase pneumonia has not cleared. It is difficult for him to get to the office for imaging  - azithromycin (ZITHROMAX) 250 MG tablet; Take 2 tablets on day 1, then 1 tablet daily on days 2 through 5  Dispense: 6 tablet; Refill: 0      I discussed the assessment and treatment plan with the patient. The patient was provided an opportunity to ask questions and all were answered. The patient agreed with the plan and demonstrated an understanding of the instructions.   The patient was advised to call back or seek an in-person evaluation if the symptoms worsen or if the condition fails to improve as anticipated.   [name], NP

## 2023-04-23 ENCOUNTER — Other Ambulatory Visit: Payer: Self-pay | Admitting: Adult Health

## 2023-04-23 DIAGNOSIS — G894 Chronic pain syndrome: Secondary | ICD-10-CM

## 2023-04-23 NOTE — Telephone Encounter (Signed)
Also requesting refill of  oxyCODONE (ROXICODONE) 15 MG immediate release tablet

## 2023-04-24 MED ORDER — ONDANSETRON HCL 4 MG PO TABS
4.0000 mg | ORAL_TABLET | Freq: Once | ORAL | 0 refills | Status: AC
Start: 2023-04-24 — End: 2023-04-24

## 2023-04-25 ENCOUNTER — Other Ambulatory Visit: Payer: Self-pay | Admitting: Adult Health

## 2023-04-25 ENCOUNTER — Telehealth: Payer: Self-pay | Admitting: Adult Health

## 2023-04-25 DIAGNOSIS — C3491 Malignant neoplasm of unspecified part of right bronchus or lung: Secondary | ICD-10-CM

## 2023-04-25 DIAGNOSIS — G894 Chronic pain syndrome: Secondary | ICD-10-CM

## 2023-04-25 MED ORDER — OXYCODONE HCL 15 MG PO TABS: 15.0000 mg | ORAL_TABLET | Freq: Four times a day (QID) | ORAL | 0 refills | Status: AC

## 2023-04-25 NOTE — Telephone Encounter (Signed)
Okay for refill?  

## 2023-04-25 NOTE — Telephone Encounter (Signed)
Prescription Request  04/25/2023  LOV: 11/27/2022  What is the name of the medication or equipment? oxyCODONE oxyCODONE (ROXICODONE) 15 MG immediate release tablet  Have you contacted your pharmacy to request a refill? No   Pt's spouse called to say Pt will be out of this Rx before the weekend is up. Please send refill, as soon as possible.  Which pharmacy would you like this sent to?   [id number] [place], [place] - [address] AT [facility] [address] [facility] [address] Phone: [phone]  Fax: [phone]     Patient notified that their request is being sent to the clinical staff for review and that they should receive a response within 2 business days.   Please advise at Mobile [phone] (mobile)

## 2023-04-28 ENCOUNTER — Telehealth: Payer: Self-pay | Admitting: Adult Health

## 2023-04-28 ENCOUNTER — Ambulatory Visit: Payer: Self-pay

## 2023-04-28 NOTE — Patient Instructions (Signed)
Visit Information  Thank you for taking time to visit with me today. Please don't hesitate to contact me if I can be of assistance to you.   Following are the goals we discussed today:   Goals Addressed             This Visit's Progress    Lung cancer       Care Coordination Interventions: Evaluation of current treatment plan related to Lung cancer and patient's adherence to plan as established by provider Discussed plans with patient for ongoing care management follow up and provided patient with direct contact information for care management team   Reviewed upcoming provider appointments and treatment appointments Assessed support system. Has consistent/reliable family or other support: Yes  Active listening / Reflection utilized  Emotional Support Provided  Spoke with spouse [name] she states they have made the decision on hospice and that they have called hospice and waiting for scheduling. She reports patient is weaker and sleeping more. She also reports he has a fear of breathlessness.  Discussed hospice and how they will help with that along with pain management to make him comfortable. She verbalized understanding. No concerns.         If you are experiencing a Mental Health or Behavioral Health Crisis or need someone to talk to, please call the Suicide and Crisis Lifeline: 988   Patient verbalizes understanding of instructions and care plan provided today and agrees to view in MyChart. Active MyChart status and patient understanding of how to access instructions and care plan via MyChart confirmed with patient.     The patient has been provided with contact information for the care management team and has been advised to call with any health related questions or concerns.   Bary Leriche, RN, MSN [facility], [facility] Management Community Coordinator Direct Dial: [phone]  Fax: [phone] Website:  [email or web address]

## 2023-04-28 NOTE — Telephone Encounter (Signed)
Sherri with [facility] is calling and the family would like cory to be attending of records for this patient

## 2023-04-28 NOTE — Patient Outreach (Signed)
Care Coordination   Follow Up Visit Note   04/28/2023 Name: LINDEN PEPPLER MRN: [id number] DOB: 1955/12/29  Yevonne Pax Mense is a 67 y.o. year old male who sees Nafziger, Kandee Keen, NP for primary care. I  spoke with spouse [name] by phone today.   What matters to the patients health and wellness today?  Hospice referral    Goals Addressed             This Visit's Progress    Lung cancer       Care Coordination Interventions: Evaluation of current treatment plan related to Lung cancer and patient's adherence to plan as established by provider Discussed plans with patient for ongoing care management follow up and provided patient with direct contact information for care management team   Reviewed upcoming provider appointments and treatment appointments Assessed support system. Has consistent/reliable family or other support: Yes  Active listening / Reflection utilized  Emotional Support Provided  Spoke with spouse [name] she states they have made the decision on hospice and that they have called hospice and waiting for scheduling. She reports patient is weaker and sleeping more. She also reports he has a fear of breathlessness.  Discussed hospice and how they will help with that along with pain management to make him comfortable. She verbalized understanding. No concerns.        SDOH assessments and interventions completed:  Yes     Care Coordination Interventions:  Yes, provided   Follow up plan: Follow up call scheduled for October    Encounter Outcome:  Patient Visit Completed   Bary Leriche, RN, MSN [place]  [facility], [facility] Management Community Coordinator Direct Dial: [phone]  Fax: [phone] Website: [email or web address]

## 2023-04-29 DIAGNOSIS — J440 Chronic obstructive pulmonary disease with acute lower respiratory infection: Secondary | ICD-10-CM | POA: Diagnosis not present

## 2023-04-29 DIAGNOSIS — K219 Gastro-esophageal reflux disease without esophagitis: Secondary | ICD-10-CM | POA: Diagnosis not present

## 2023-04-29 DIAGNOSIS — I70219 Atherosclerosis of native arteries of extremities with intermittent claudication, unspecified extremity: Secondary | ICD-10-CM | POA: Diagnosis not present

## 2023-04-29 DIAGNOSIS — E871 Hypo-osmolality and hyponatremia: Secondary | ICD-10-CM | POA: Diagnosis not present

## 2023-04-29 DIAGNOSIS — C3491 Malignant neoplasm of unspecified part of right bronchus or lung: Secondary | ICD-10-CM | POA: Diagnosis not present

## 2023-04-29 DIAGNOSIS — E785 Hyperlipidemia, unspecified: Secondary | ICD-10-CM | POA: Diagnosis not present

## 2023-04-29 DIAGNOSIS — I10 Essential (primary) hypertension: Secondary | ICD-10-CM | POA: Diagnosis not present

## 2023-04-29 DIAGNOSIS — I219 Acute myocardial infarction, unspecified: Secondary | ICD-10-CM | POA: Diagnosis not present

## 2023-04-29 DIAGNOSIS — J189 Pneumonia, unspecified organism: Secondary | ICD-10-CM | POA: Diagnosis not present

## 2023-04-29 DIAGNOSIS — F419 Anxiety disorder, unspecified: Secondary | ICD-10-CM | POA: Diagnosis not present

## 2023-04-29 DIAGNOSIS — I739 Peripheral vascular disease, unspecified: Secondary | ICD-10-CM | POA: Diagnosis not present

## 2023-04-29 NOTE — Telephone Encounter (Signed)
    Verbal orders given to Sherri  with [facility].

## 2023-04-29 NOTE — Telephone Encounter (Signed)
Please advise 

## 2023-04-30 DIAGNOSIS — J189 Pneumonia, unspecified organism: Secondary | ICD-10-CM | POA: Diagnosis not present

## 2023-04-30 DIAGNOSIS — I70219 Atherosclerosis of native arteries of extremities with intermittent claudication, unspecified extremity: Secondary | ICD-10-CM | POA: Diagnosis not present

## 2023-04-30 DIAGNOSIS — F419 Anxiety disorder, unspecified: Secondary | ICD-10-CM | POA: Diagnosis not present

## 2023-04-30 DIAGNOSIS — K219 Gastro-esophageal reflux disease without esophagitis: Secondary | ICD-10-CM | POA: Diagnosis not present

## 2023-04-30 DIAGNOSIS — I739 Peripheral vascular disease, unspecified: Secondary | ICD-10-CM | POA: Diagnosis not present

## 2023-04-30 DIAGNOSIS — I10 Essential (primary) hypertension: Secondary | ICD-10-CM | POA: Diagnosis not present

## 2023-04-30 DIAGNOSIS — I219 Acute myocardial infarction, unspecified: Secondary | ICD-10-CM | POA: Diagnosis not present

## 2023-04-30 DIAGNOSIS — E871 Hypo-osmolality and hyponatremia: Secondary | ICD-10-CM | POA: Diagnosis not present

## 2023-04-30 DIAGNOSIS — J440 Chronic obstructive pulmonary disease with acute lower respiratory infection: Secondary | ICD-10-CM | POA: Diagnosis not present

## 2023-04-30 DIAGNOSIS — E785 Hyperlipidemia, unspecified: Secondary | ICD-10-CM | POA: Diagnosis not present

## 2023-04-30 DIAGNOSIS — C3491 Malignant neoplasm of unspecified part of right bronchus or lung: Secondary | ICD-10-CM | POA: Diagnosis not present

## 2023-05-01 ENCOUNTER — Telehealth: Payer: Self-pay

## 2023-05-01 DIAGNOSIS — I70219 Atherosclerosis of native arteries of extremities with intermittent claudication, unspecified extremity: Secondary | ICD-10-CM | POA: Diagnosis not present

## 2023-05-01 DIAGNOSIS — K219 Gastro-esophageal reflux disease without esophagitis: Secondary | ICD-10-CM | POA: Diagnosis not present

## 2023-05-01 DIAGNOSIS — E871 Hypo-osmolality and hyponatremia: Secondary | ICD-10-CM | POA: Diagnosis not present

## 2023-05-01 DIAGNOSIS — I739 Peripheral vascular disease, unspecified: Secondary | ICD-10-CM | POA: Diagnosis not present

## 2023-05-01 DIAGNOSIS — E785 Hyperlipidemia, unspecified: Secondary | ICD-10-CM | POA: Diagnosis not present

## 2023-05-01 DIAGNOSIS — C3491 Malignant neoplasm of unspecified part of right bronchus or lung: Secondary | ICD-10-CM | POA: Diagnosis not present

## 2023-05-01 DIAGNOSIS — F419 Anxiety disorder, unspecified: Secondary | ICD-10-CM | POA: Diagnosis not present

## 2023-05-01 DIAGNOSIS — J440 Chronic obstructive pulmonary disease with acute lower respiratory infection: Secondary | ICD-10-CM | POA: Diagnosis not present

## 2023-05-01 DIAGNOSIS — I10 Essential (primary) hypertension: Secondary | ICD-10-CM | POA: Diagnosis not present

## 2023-05-01 DIAGNOSIS — I219 Acute myocardial infarction, unspecified: Secondary | ICD-10-CM | POA: Diagnosis not present

## 2023-05-01 DIAGNOSIS — J189 Pneumonia, unspecified organism: Secondary | ICD-10-CM | POA: Diagnosis not present

## 2023-05-01 NOTE — Patient Outreach (Signed)
Care Coordination   Follow Up Visit Note   05/01/2023 Name: Sean Rose MRN: [id number] DOB: 1956-03-16  Sean Rose is a 67 y.o. year old male who sees Sean Rose, Sean Keen, NP for primary care. I  spoke with spouse Sean Rose by phone today  What matters to the patients health and wellness today?  Has hospice now    Goals Addressed             This Visit's Progress    COMPLETED: Lung cancer       Care Coordination Interventions: Evaluation of current treatment plan related to Lung cancer and patient's adherence to plan as established by provider Discussed plans with patient for ongoing care management follow up and provided patient with direct contact information for care management team   Reviewed upcoming provider appointments and treatment appointments Assessed support system. Has consistent/reliable family or other support: Yes  Active listening / Reflection utilized  Emotional Support Provided  Spoke with spouse Sean Rose.  Hospice is now active and done a great deal for patient.  Discussed how beneficial hospice is. Advised now that hospice is involved CM would not call.  She verbalized understanding.  No concerns.         SDOH assessments and interventions completed:  Yes     Care Coordination Interventions:  Yes, provided   Follow up plan: No further intervention required.   Encounter Outcome:  Patient Visit Completed   Sean Leriche, RN, MSN [facility]  [facility], [facility] Management Community Coordinator Direct Dial: [phone]  Fax: [phone] Website: [email or web address]

## 2023-05-01 NOTE — Patient Instructions (Signed)
Visit Information  Thank you for taking time to visit with me today. Please don't hesitate to contact me if I can be of assistance to you.   Following are the goals we discussed today:   Goals Addressed             This Visit's Progress    COMPLETED: Lung cancer       Care Coordination Interventions: Evaluation of current treatment plan related to Lung cancer and patient's adherence to plan as established by provider Discussed plans with patient for ongoing care management follow up and provided patient with direct contact information for care management team   Reviewed upcoming provider appointments and treatment appointments Assessed support system. Has consistent/reliable family or other support: Yes  Active listening / Reflection utilized  Emotional Support Provided  Spoke with spouse [name].  Hospice is now active and done a great deal for patient.  Discussed how beneficial hospice is. Advised now that hospice is involved CM would not call.  She verbalized understanding.  No concerns.           If you are experiencing a Mental Health or Behavioral Health Crisis or need someone to talk to, please call the Suicide and Crisis Lifeline: 988   Patient verbalizes understanding of instructions and care plan provided today and agrees to view in MyChart. Active MyChart status and patient understanding of how to access instructions and care plan via MyChart confirmed with patient.     No further follow up required: Patient now hospice  [name] Idelle Jo, RN, MSN [facility]  [facility], [facility] Management Community Coordinator Direct Dial: [phone]  Fax: [phone] Website: [email or web address]

## 2023-05-02 ENCOUNTER — Other Ambulatory Visit: Payer: Self-pay | Admitting: Adult Health

## 2023-05-02 DIAGNOSIS — I10 Essential (primary) hypertension: Secondary | ICD-10-CM | POA: Diagnosis not present

## 2023-05-02 DIAGNOSIS — Z1212 Encounter for screening for malignant neoplasm of rectum: Secondary | ICD-10-CM

## 2023-05-02 DIAGNOSIS — K219 Gastro-esophageal reflux disease without esophagitis: Secondary | ICD-10-CM | POA: Diagnosis not present

## 2023-05-02 DIAGNOSIS — I739 Peripheral vascular disease, unspecified: Secondary | ICD-10-CM | POA: Diagnosis not present

## 2023-05-02 DIAGNOSIS — I70219 Atherosclerosis of native arteries of extremities with intermittent claudication, unspecified extremity: Secondary | ICD-10-CM | POA: Diagnosis not present

## 2023-05-02 DIAGNOSIS — J189 Pneumonia, unspecified organism: Secondary | ICD-10-CM | POA: Diagnosis not present

## 2023-05-02 DIAGNOSIS — Z1211 Encounter for screening for malignant neoplasm of colon: Secondary | ICD-10-CM

## 2023-05-02 DIAGNOSIS — C3491 Malignant neoplasm of unspecified part of right bronchus or lung: Secondary | ICD-10-CM | POA: Diagnosis not present

## 2023-05-02 DIAGNOSIS — E871 Hypo-osmolality and hyponatremia: Secondary | ICD-10-CM | POA: Diagnosis not present

## 2023-05-02 DIAGNOSIS — J440 Chronic obstructive pulmonary disease with acute lower respiratory infection: Secondary | ICD-10-CM | POA: Diagnosis not present

## 2023-05-02 DIAGNOSIS — E785 Hyperlipidemia, unspecified: Secondary | ICD-10-CM | POA: Diagnosis not present

## 2023-05-02 DIAGNOSIS — I219 Acute myocardial infarction, unspecified: Secondary | ICD-10-CM | POA: Diagnosis not present

## 2023-05-02 DIAGNOSIS — F419 Anxiety disorder, unspecified: Secondary | ICD-10-CM | POA: Diagnosis not present

## 2023-05-03 DIAGNOSIS — C3491 Malignant neoplasm of unspecified part of right bronchus or lung: Secondary | ICD-10-CM | POA: Diagnosis not present

## 2023-05-03 DIAGNOSIS — J440 Chronic obstructive pulmonary disease with acute lower respiratory infection: Secondary | ICD-10-CM | POA: Diagnosis not present

## 2023-05-03 DIAGNOSIS — I70219 Atherosclerosis of native arteries of extremities with intermittent claudication, unspecified extremity: Secondary | ICD-10-CM | POA: Diagnosis not present

## 2023-05-03 DIAGNOSIS — E785 Hyperlipidemia, unspecified: Secondary | ICD-10-CM | POA: Diagnosis not present

## 2023-05-03 DIAGNOSIS — K219 Gastro-esophageal reflux disease without esophagitis: Secondary | ICD-10-CM | POA: Diagnosis not present

## 2023-05-03 DIAGNOSIS — E871 Hypo-osmolality and hyponatremia: Secondary | ICD-10-CM | POA: Diagnosis not present

## 2023-05-03 DIAGNOSIS — F419 Anxiety disorder, unspecified: Secondary | ICD-10-CM | POA: Diagnosis not present

## 2023-05-03 DIAGNOSIS — I219 Acute myocardial infarction, unspecified: Secondary | ICD-10-CM | POA: Diagnosis not present

## 2023-05-03 DIAGNOSIS — I739 Peripheral vascular disease, unspecified: Secondary | ICD-10-CM | POA: Diagnosis not present

## 2023-05-03 DIAGNOSIS — I10 Essential (primary) hypertension: Secondary | ICD-10-CM | POA: Diagnosis not present

## 2023-05-03 DIAGNOSIS — J189 Pneumonia, unspecified organism: Secondary | ICD-10-CM | POA: Diagnosis not present

## 2023-05-04 DIAGNOSIS — C3491 Malignant neoplasm of unspecified part of right bronchus or lung: Secondary | ICD-10-CM | POA: Diagnosis not present

## 2023-05-04 DIAGNOSIS — I739 Peripheral vascular disease, unspecified: Secondary | ICD-10-CM | POA: Diagnosis not present

## 2023-05-04 DIAGNOSIS — I10 Essential (primary) hypertension: Secondary | ICD-10-CM | POA: Diagnosis not present

## 2023-05-04 DIAGNOSIS — J189 Pneumonia, unspecified organism: Secondary | ICD-10-CM | POA: Diagnosis not present

## 2023-05-04 DIAGNOSIS — E785 Hyperlipidemia, unspecified: Secondary | ICD-10-CM | POA: Diagnosis not present

## 2023-05-04 DIAGNOSIS — K219 Gastro-esophageal reflux disease without esophagitis: Secondary | ICD-10-CM | POA: Diagnosis not present

## 2023-05-04 DIAGNOSIS — E871 Hypo-osmolality and hyponatremia: Secondary | ICD-10-CM | POA: Diagnosis not present

## 2023-05-04 DIAGNOSIS — I70219 Atherosclerosis of native arteries of extremities with intermittent claudication, unspecified extremity: Secondary | ICD-10-CM | POA: Diagnosis not present

## 2023-05-04 DIAGNOSIS — I219 Acute myocardial infarction, unspecified: Secondary | ICD-10-CM | POA: Diagnosis not present

## 2023-05-04 DIAGNOSIS — F419 Anxiety disorder, unspecified: Secondary | ICD-10-CM | POA: Diagnosis not present

## 2023-05-04 DIAGNOSIS — J440 Chronic obstructive pulmonary disease with acute lower respiratory infection: Secondary | ICD-10-CM | POA: Diagnosis not present

## 2023-05-05 ENCOUNTER — Other Ambulatory Visit: Payer: Self-pay | Admitting: Cardiovascular Disease

## 2023-05-05 DIAGNOSIS — J189 Pneumonia, unspecified organism: Secondary | ICD-10-CM | POA: Diagnosis not present

## 2023-05-05 DIAGNOSIS — I10 Essential (primary) hypertension: Secondary | ICD-10-CM | POA: Diagnosis not present

## 2023-05-05 DIAGNOSIS — K219 Gastro-esophageal reflux disease without esophagitis: Secondary | ICD-10-CM | POA: Diagnosis not present

## 2023-05-05 DIAGNOSIS — C3491 Malignant neoplasm of unspecified part of right bronchus or lung: Secondary | ICD-10-CM | POA: Diagnosis not present

## 2023-05-05 DIAGNOSIS — E871 Hypo-osmolality and hyponatremia: Secondary | ICD-10-CM | POA: Diagnosis not present

## 2023-05-05 DIAGNOSIS — I219 Acute myocardial infarction, unspecified: Secondary | ICD-10-CM | POA: Diagnosis not present

## 2023-05-05 DIAGNOSIS — I70219 Atherosclerosis of native arteries of extremities with intermittent claudication, unspecified extremity: Secondary | ICD-10-CM | POA: Diagnosis not present

## 2023-05-05 DIAGNOSIS — J440 Chronic obstructive pulmonary disease with acute lower respiratory infection: Secondary | ICD-10-CM | POA: Diagnosis not present

## 2023-05-05 DIAGNOSIS — F419 Anxiety disorder, unspecified: Secondary | ICD-10-CM | POA: Diagnosis not present

## 2023-05-05 DIAGNOSIS — I739 Peripheral vascular disease, unspecified: Secondary | ICD-10-CM | POA: Diagnosis not present

## 2023-05-05 DIAGNOSIS — E785 Hyperlipidemia, unspecified: Secondary | ICD-10-CM | POA: Diagnosis not present

## 2023-05-06 DIAGNOSIS — K219 Gastro-esophageal reflux disease without esophagitis: Secondary | ICD-10-CM | POA: Diagnosis not present

## 2023-05-06 DIAGNOSIS — I70219 Atherosclerosis of native arteries of extremities with intermittent claudication, unspecified extremity: Secondary | ICD-10-CM | POA: Diagnosis not present

## 2023-05-06 DIAGNOSIS — I10 Essential (primary) hypertension: Secondary | ICD-10-CM | POA: Diagnosis not present

## 2023-05-06 DIAGNOSIS — I739 Peripheral vascular disease, unspecified: Secondary | ICD-10-CM | POA: Diagnosis not present

## 2023-05-06 DIAGNOSIS — J440 Chronic obstructive pulmonary disease with acute lower respiratory infection: Secondary | ICD-10-CM | POA: Diagnosis not present

## 2023-05-06 DIAGNOSIS — F419 Anxiety disorder, unspecified: Secondary | ICD-10-CM | POA: Diagnosis not present

## 2023-05-06 DIAGNOSIS — E871 Hypo-osmolality and hyponatremia: Secondary | ICD-10-CM | POA: Diagnosis not present

## 2023-05-06 DIAGNOSIS — J189 Pneumonia, unspecified organism: Secondary | ICD-10-CM | POA: Diagnosis not present

## 2023-05-06 DIAGNOSIS — E785 Hyperlipidemia, unspecified: Secondary | ICD-10-CM | POA: Diagnosis not present

## 2023-05-06 DIAGNOSIS — I219 Acute myocardial infarction, unspecified: Secondary | ICD-10-CM | POA: Diagnosis not present

## 2023-05-06 DIAGNOSIS — C3491 Malignant neoplasm of unspecified part of right bronchus or lung: Secondary | ICD-10-CM | POA: Diagnosis not present

## 2023-05-07 DIAGNOSIS — J440 Chronic obstructive pulmonary disease with acute lower respiratory infection: Secondary | ICD-10-CM | POA: Diagnosis not present

## 2023-05-07 DIAGNOSIS — I10 Essential (primary) hypertension: Secondary | ICD-10-CM | POA: Diagnosis not present

## 2023-05-07 DIAGNOSIS — K219 Gastro-esophageal reflux disease without esophagitis: Secondary | ICD-10-CM | POA: Diagnosis not present

## 2023-05-07 DIAGNOSIS — C3491 Malignant neoplasm of unspecified part of right bronchus or lung: Secondary | ICD-10-CM | POA: Diagnosis not present

## 2023-05-07 DIAGNOSIS — E871 Hypo-osmolality and hyponatremia: Secondary | ICD-10-CM | POA: Diagnosis not present

## 2023-05-07 DIAGNOSIS — I219 Acute myocardial infarction, unspecified: Secondary | ICD-10-CM | POA: Diagnosis not present

## 2023-05-07 DIAGNOSIS — I739 Peripheral vascular disease, unspecified: Secondary | ICD-10-CM | POA: Diagnosis not present

## 2023-05-07 DIAGNOSIS — J189 Pneumonia, unspecified organism: Secondary | ICD-10-CM | POA: Diagnosis not present

## 2023-05-07 DIAGNOSIS — F419 Anxiety disorder, unspecified: Secondary | ICD-10-CM | POA: Diagnosis not present

## 2023-05-07 DIAGNOSIS — I70219 Atherosclerosis of native arteries of extremities with intermittent claudication, unspecified extremity: Secondary | ICD-10-CM | POA: Diagnosis not present

## 2023-05-07 DIAGNOSIS — E785 Hyperlipidemia, unspecified: Secondary | ICD-10-CM | POA: Diagnosis not present

## 2023-05-08 DIAGNOSIS — J189 Pneumonia, unspecified organism: Secondary | ICD-10-CM | POA: Diagnosis not present

## 2023-05-08 DIAGNOSIS — I219 Acute myocardial infarction, unspecified: Secondary | ICD-10-CM | POA: Diagnosis not present

## 2023-05-08 DIAGNOSIS — E871 Hypo-osmolality and hyponatremia: Secondary | ICD-10-CM | POA: Diagnosis not present

## 2023-05-08 DIAGNOSIS — I10 Essential (primary) hypertension: Secondary | ICD-10-CM | POA: Diagnosis not present

## 2023-05-08 DIAGNOSIS — I739 Peripheral vascular disease, unspecified: Secondary | ICD-10-CM | POA: Diagnosis not present

## 2023-05-08 DIAGNOSIS — J440 Chronic obstructive pulmonary disease with acute lower respiratory infection: Secondary | ICD-10-CM | POA: Diagnosis not present

## 2023-05-08 DIAGNOSIS — F419 Anxiety disorder, unspecified: Secondary | ICD-10-CM | POA: Diagnosis not present

## 2023-05-08 DIAGNOSIS — E785 Hyperlipidemia, unspecified: Secondary | ICD-10-CM | POA: Diagnosis not present

## 2023-05-08 DIAGNOSIS — I70219 Atherosclerosis of native arteries of extremities with intermittent claudication, unspecified extremity: Secondary | ICD-10-CM | POA: Diagnosis not present

## 2023-05-08 DIAGNOSIS — K219 Gastro-esophageal reflux disease without esophagitis: Secondary | ICD-10-CM | POA: Diagnosis not present

## 2023-05-08 DIAGNOSIS — C3491 Malignant neoplasm of unspecified part of right bronchus or lung: Secondary | ICD-10-CM | POA: Diagnosis not present

## 2023-05-09 ENCOUNTER — Other Ambulatory Visit: Payer: Self-pay | Admitting: Cardiovascular Disease

## 2023-05-09 DIAGNOSIS — E785 Hyperlipidemia, unspecified: Secondary | ICD-10-CM | POA: Diagnosis not present

## 2023-05-09 DIAGNOSIS — I219 Acute myocardial infarction, unspecified: Secondary | ICD-10-CM | POA: Diagnosis not present

## 2023-05-09 DIAGNOSIS — F419 Anxiety disorder, unspecified: Secondary | ICD-10-CM | POA: Diagnosis not present

## 2023-05-09 DIAGNOSIS — J440 Chronic obstructive pulmonary disease with acute lower respiratory infection: Secondary | ICD-10-CM | POA: Diagnosis not present

## 2023-05-09 DIAGNOSIS — I739 Peripheral vascular disease, unspecified: Secondary | ICD-10-CM | POA: Diagnosis not present

## 2023-05-09 DIAGNOSIS — E871 Hypo-osmolality and hyponatremia: Secondary | ICD-10-CM | POA: Diagnosis not present

## 2023-05-09 DIAGNOSIS — J189 Pneumonia, unspecified organism: Secondary | ICD-10-CM | POA: Diagnosis not present

## 2023-05-09 DIAGNOSIS — C3491 Malignant neoplasm of unspecified part of right bronchus or lung: Secondary | ICD-10-CM | POA: Diagnosis not present

## 2023-05-09 DIAGNOSIS — I70219 Atherosclerosis of native arteries of extremities with intermittent claudication, unspecified extremity: Secondary | ICD-10-CM | POA: Diagnosis not present

## 2023-05-09 DIAGNOSIS — K219 Gastro-esophageal reflux disease without esophagitis: Secondary | ICD-10-CM | POA: Diagnosis not present

## 2023-05-09 DIAGNOSIS — I10 Essential (primary) hypertension: Secondary | ICD-10-CM | POA: Diagnosis not present

## 2023-05-10 DIAGNOSIS — F419 Anxiety disorder, unspecified: Secondary | ICD-10-CM | POA: Diagnosis not present

## 2023-05-10 DIAGNOSIS — K219 Gastro-esophageal reflux disease without esophagitis: Secondary | ICD-10-CM | POA: Diagnosis not present

## 2023-05-10 DIAGNOSIS — I219 Acute myocardial infarction, unspecified: Secondary | ICD-10-CM | POA: Diagnosis not present

## 2023-05-10 DIAGNOSIS — E871 Hypo-osmolality and hyponatremia: Secondary | ICD-10-CM | POA: Diagnosis not present

## 2023-05-10 DIAGNOSIS — C3491 Malignant neoplasm of unspecified part of right bronchus or lung: Secondary | ICD-10-CM | POA: Diagnosis not present

## 2023-05-10 DIAGNOSIS — I70219 Atherosclerosis of native arteries of extremities with intermittent claudication, unspecified extremity: Secondary | ICD-10-CM | POA: Diagnosis not present

## 2023-05-10 DIAGNOSIS — J189 Pneumonia, unspecified organism: Secondary | ICD-10-CM | POA: Diagnosis not present

## 2023-05-10 DIAGNOSIS — E785 Hyperlipidemia, unspecified: Secondary | ICD-10-CM | POA: Diagnosis not present

## 2023-05-10 DIAGNOSIS — J440 Chronic obstructive pulmonary disease with acute lower respiratory infection: Secondary | ICD-10-CM | POA: Diagnosis not present

## 2023-05-10 DIAGNOSIS — I10 Essential (primary) hypertension: Secondary | ICD-10-CM | POA: Diagnosis not present

## 2023-05-10 DIAGNOSIS — I739 Peripheral vascular disease, unspecified: Secondary | ICD-10-CM | POA: Diagnosis not present

## 2023-05-11 DIAGNOSIS — J189 Pneumonia, unspecified organism: Secondary | ICD-10-CM | POA: Diagnosis not present

## 2023-05-11 DIAGNOSIS — C3491 Malignant neoplasm of unspecified part of right bronchus or lung: Secondary | ICD-10-CM | POA: Diagnosis not present

## 2023-05-11 DIAGNOSIS — I10 Essential (primary) hypertension: Secondary | ICD-10-CM | POA: Diagnosis not present

## 2023-05-11 DIAGNOSIS — I70219 Atherosclerosis of native arteries of extremities with intermittent claudication, unspecified extremity: Secondary | ICD-10-CM | POA: Diagnosis not present

## 2023-05-11 DIAGNOSIS — I219 Acute myocardial infarction, unspecified: Secondary | ICD-10-CM | POA: Diagnosis not present

## 2023-05-11 DIAGNOSIS — E871 Hypo-osmolality and hyponatremia: Secondary | ICD-10-CM | POA: Diagnosis not present

## 2023-05-11 DIAGNOSIS — E785 Hyperlipidemia, unspecified: Secondary | ICD-10-CM | POA: Diagnosis not present

## 2023-05-11 DIAGNOSIS — I739 Peripheral vascular disease, unspecified: Secondary | ICD-10-CM | POA: Diagnosis not present

## 2023-05-11 DIAGNOSIS — J440 Chronic obstructive pulmonary disease with acute lower respiratory infection: Secondary | ICD-10-CM | POA: Diagnosis not present

## 2023-05-11 DIAGNOSIS — F419 Anxiety disorder, unspecified: Secondary | ICD-10-CM | POA: Diagnosis not present

## 2023-05-11 DIAGNOSIS — K219 Gastro-esophageal reflux disease without esophagitis: Secondary | ICD-10-CM | POA: Diagnosis not present

## 2023-05-11 NOTE — Progress Notes (Signed)
Patient was initially scheduled for a virtual visit on 05/20/2023. However, when staff called to go over medications and vital signs, we were told that he unfortunately passed away last week. Patient had metastatic lung cancer and was under hospice care.  Corrin Parker, PA-C 05/21/2023 7:29 AM

## 2023-05-12 DIAGNOSIS — F419 Anxiety disorder, unspecified: Secondary | ICD-10-CM | POA: Diagnosis not present

## 2023-05-12 DIAGNOSIS — I70219 Atherosclerosis of native arteries of extremities with intermittent claudication, unspecified extremity: Secondary | ICD-10-CM | POA: Diagnosis not present

## 2023-05-12 DIAGNOSIS — E871 Hypo-osmolality and hyponatremia: Secondary | ICD-10-CM | POA: Diagnosis not present

## 2023-05-12 DIAGNOSIS — K219 Gastro-esophageal reflux disease without esophagitis: Secondary | ICD-10-CM | POA: Diagnosis not present

## 2023-05-12 DIAGNOSIS — J440 Chronic obstructive pulmonary disease with acute lower respiratory infection: Secondary | ICD-10-CM | POA: Diagnosis not present

## 2023-05-12 DIAGNOSIS — I739 Peripheral vascular disease, unspecified: Secondary | ICD-10-CM | POA: Diagnosis not present

## 2023-05-12 DIAGNOSIS — I219 Acute myocardial infarction, unspecified: Secondary | ICD-10-CM | POA: Diagnosis not present

## 2023-05-12 DIAGNOSIS — I10 Essential (primary) hypertension: Secondary | ICD-10-CM | POA: Diagnosis not present

## 2023-05-12 DIAGNOSIS — J189 Pneumonia, unspecified organism: Secondary | ICD-10-CM | POA: Diagnosis not present

## 2023-05-12 DIAGNOSIS — C3491 Malignant neoplasm of unspecified part of right bronchus or lung: Secondary | ICD-10-CM | POA: Diagnosis not present

## 2023-05-12 DIAGNOSIS — E785 Hyperlipidemia, unspecified: Secondary | ICD-10-CM | POA: Diagnosis not present

## 2023-05-13 DIAGNOSIS — I219 Acute myocardial infarction, unspecified: Secondary | ICD-10-CM | POA: Diagnosis not present

## 2023-05-13 DIAGNOSIS — E871 Hypo-osmolality and hyponatremia: Secondary | ICD-10-CM | POA: Diagnosis not present

## 2023-05-13 DIAGNOSIS — I70219 Atherosclerosis of native arteries of extremities with intermittent claudication, unspecified extremity: Secondary | ICD-10-CM | POA: Diagnosis not present

## 2023-05-13 DIAGNOSIS — C3491 Malignant neoplasm of unspecified part of right bronchus or lung: Secondary | ICD-10-CM | POA: Diagnosis not present

## 2023-05-13 DIAGNOSIS — I739 Peripheral vascular disease, unspecified: Secondary | ICD-10-CM | POA: Diagnosis not present

## 2023-05-13 DIAGNOSIS — F419 Anxiety disorder, unspecified: Secondary | ICD-10-CM | POA: Diagnosis not present

## 2023-05-13 DIAGNOSIS — J440 Chronic obstructive pulmonary disease with acute lower respiratory infection: Secondary | ICD-10-CM | POA: Diagnosis not present

## 2023-05-13 DIAGNOSIS — I10 Essential (primary) hypertension: Secondary | ICD-10-CM | POA: Diagnosis not present

## 2023-05-13 DIAGNOSIS — J189 Pneumonia, unspecified organism: Secondary | ICD-10-CM | POA: Diagnosis not present

## 2023-05-13 DIAGNOSIS — K219 Gastro-esophageal reflux disease without esophagitis: Secondary | ICD-10-CM | POA: Diagnosis not present

## 2023-05-13 DIAGNOSIS — E785 Hyperlipidemia, unspecified: Secondary | ICD-10-CM | POA: Diagnosis not present

## 2023-05-14 DIAGNOSIS — C3491 Malignant neoplasm of unspecified part of right bronchus or lung: Secondary | ICD-10-CM | POA: Diagnosis not present

## 2023-05-14 DIAGNOSIS — I219 Acute myocardial infarction, unspecified: Secondary | ICD-10-CM | POA: Diagnosis not present

## 2023-05-14 DIAGNOSIS — J440 Chronic obstructive pulmonary disease with acute lower respiratory infection: Secondary | ICD-10-CM | POA: Diagnosis not present

## 2023-05-14 DIAGNOSIS — I10 Essential (primary) hypertension: Secondary | ICD-10-CM | POA: Diagnosis not present

## 2023-05-14 DIAGNOSIS — I739 Peripheral vascular disease, unspecified: Secondary | ICD-10-CM | POA: Diagnosis not present

## 2023-05-14 DIAGNOSIS — I70219 Atherosclerosis of native arteries of extremities with intermittent claudication, unspecified extremity: Secondary | ICD-10-CM | POA: Diagnosis not present

## 2023-05-14 DIAGNOSIS — E785 Hyperlipidemia, unspecified: Secondary | ICD-10-CM | POA: Diagnosis not present

## 2023-05-14 DIAGNOSIS — E871 Hypo-osmolality and hyponatremia: Secondary | ICD-10-CM | POA: Diagnosis not present

## 2023-05-14 DIAGNOSIS — F419 Anxiety disorder, unspecified: Secondary | ICD-10-CM | POA: Diagnosis not present

## 2023-05-14 DIAGNOSIS — J189 Pneumonia, unspecified organism: Secondary | ICD-10-CM | POA: Diagnosis not present

## 2023-05-14 DIAGNOSIS — K219 Gastro-esophageal reflux disease without esophagitis: Secondary | ICD-10-CM | POA: Diagnosis not present

## 2023-05-20 ENCOUNTER — Ambulatory Visit: Payer: BC Managed Care – PPO | Admitting: Student

## 2023-05-21 ENCOUNTER — Other Ambulatory Visit: Payer: Self-pay | Admitting: Cardiovascular Disease

## 2023-05-21 NOTE — Telephone Encounter (Signed)
Pt is deceased, no refill needed

## 2023-05-21 NOTE — Telephone Encounter (Signed)
Pt has had 3 attempts. Pharmacy is requesting a refill. Does Sean Rose want to refill? Please advise

## 2023-06-12 ENCOUNTER — Encounter: Payer: Self-pay | Admitting: Physician Assistant

## 2023-06-19 ENCOUNTER — Encounter: Payer: Self-pay | Admitting: Physician Assistant

## 2023-06-19 ENCOUNTER — Inpatient Hospital Stay: Payer: Self-pay

## 2023-06-19 NOTE — Telephone Encounter (Signed)
Telephone call  

## 2023-08-27 ENCOUNTER — Other Ambulatory Visit: Payer: BC Managed Care – PPO

## 2023-09-03 ENCOUNTER — Ambulatory Visit: Payer: BC Managed Care – PPO | Admitting: Internal Medicine
